# Patient Record
Sex: Female | Born: 1970 | Race: White | Hispanic: No | Smoking: Current every day smoker
Health system: Southern US, Community
[De-identification: ages and names within clinical notes are randomized; demographics above are authoritative.]

## PROBLEM LIST (undated history)

## (undated) DIAGNOSIS — M545 Low back pain, unspecified: Secondary | ICD-10-CM

## (undated) DIAGNOSIS — Z9889 Other specified postprocedural states: Secondary | ICD-10-CM

## (undated) DIAGNOSIS — I499 Cardiac arrhythmia, unspecified: Secondary | ICD-10-CM

## (undated) DIAGNOSIS — I10 Essential (primary) hypertension: Secondary | ICD-10-CM

## (undated) DIAGNOSIS — R0602 Shortness of breath: Secondary | ICD-10-CM

## (undated) DIAGNOSIS — F319 Bipolar disorder, unspecified: Secondary | ICD-10-CM

## (undated) DIAGNOSIS — K5790 Diverticulosis of intestine, part unspecified, without perforation or abscess without bleeding: Secondary | ICD-10-CM

## (undated) DIAGNOSIS — Z8719 Personal history of other diseases of the digestive system: Secondary | ICD-10-CM

## (undated) DIAGNOSIS — M797 Fibromyalgia: Secondary | ICD-10-CM

## (undated) DIAGNOSIS — R51 Headache: Secondary | ICD-10-CM

## (undated) DIAGNOSIS — M1990 Unspecified osteoarthritis, unspecified site: Secondary | ICD-10-CM

## (undated) DIAGNOSIS — F419 Anxiety disorder, unspecified: Secondary | ICD-10-CM

## (undated) HISTORY — DX: Unspecified osteoarthritis, unspecified site: M19.90

## (undated) HISTORY — PX: OTHER SURGICAL HISTORY: SHX169

## (undated) HISTORY — DX: Other specified postprocedural states: Z98.890

## (undated) HISTORY — DX: Diverticulosis of intestine, part unspecified, without perforation or abscess without bleeding: K57.90

## (undated) HISTORY — PX: TONSILLECTOMY AND ADENOIDECTOMY: SHX28

## (undated) HISTORY — PX: CHOLECYSTECTOMY: SHX55

## (undated) HISTORY — PX: HEMORRHOID SURGERY: SHX153

## (undated) HISTORY — PX: TONSILLECTOMY: SUR1361

## (undated) HISTORY — PX: FOOT SURGERY: SHX648

## (undated) HISTORY — PX: APPENDECTOMY: SHX54

## (undated) HISTORY — PX: ABDOMINAL HYSTERECTOMY: SHX81

## (undated) NOTE — ED Provider Notes (Signed)
 Formatting of this note is different from the original.  History   Chief Complaint  Patient presents with  ? Abdominal Pain    C/O RUQ PAIN X 2 HRS   HPI With the above chief complaint. She states she has this pain every day and she just cannot take it anymore. She states the pain is no different than usual and that she has had ultrasounds and computed axial tomography scans and blood work on multiple occasions in the past without any etiology of the pain. She states she has not maintained on any sort of pain medication regimen. She states I just need some pain medicine. The pain she describes as sharp and severe in the right upper quadrant. He has been nauseous but not vomiting. She did have some loose stool today that was nonbloody and without any melena. Denies any trauma.  Allergies as of 01/24/2011 - Review Complete 01/24/2011  Allergen Reaction Noted  ? Augmentin (amoxicillin-pot clavulanate) Rash 01/24/2011   Past Medical History  Diagnosis Date  ? Anxiety   ? Fibromyalgia   ? Bipolar depression   ? Degenerative disc disease   ? Diverticulitis   ? Colitis    Past Surgical History  Procedure Date  ? Cholecystectomy   ? Hysterectomy   ? Hernia repair   ? Hemorrhoid surgery    History reviewed. No pertinent family history.  History  Substance Use Topics  ? Smoking status: Current Everyday Smoker  ? Smokeless tobacco: Not on file  ? Alcohol Use: 0.6 oz/week    1 Glasses of wine per week   she states she is here visiting from [place]  to visit the beach  Patient's Medications  New Prescriptions   No medications on file  Previous Medications   CLONAZEPAM  (KLONOPIN ) 1 MG TABLET    Take 1 mg by mouth 2 (two) times daily as needed.     CYCLOBENZAPRINE (FLEXERIL) 10 MG TABLET    Take 25 mg by mouth 2 (two) times daily at 0600 and 1800.     DIAZEPAM  (VALIUM ) 10 MG TABLET    Take 10 mg by mouth 3 (three) times daily as needed.     DULOXETINE  (CYMBALTA ) 20  MG CAPSULE    Take 20 mg by mouth 2 (two) times daily.     GABAPENTIN  (NEURONTIN ) 600 MG TABLET    Take 600 mg by mouth 4 (four) times daily.     OMEPRAZOLE  (PRILOSEC) 20 MG CAPSULE    Take 20 mg by mouth every morning before breakfast.    Modified Medications   No medications on file  Discontinued Medications   No medications on file   Review of Systems Constitutional:  No fevers or chills Eyes:  No vision changes HENT:  No sore throat or sinus congestion Respiratory:  No cough Cardiovascular:  Chest pain GI:  Right upper quadrant pain that does not radiate. Nausea without vomiting. Loose stool today nonbloody and not black GU:  Mild pain with urination today. No vaginal discharge Musculoskeletal:  No extremity trauma or complaints Integument:  Denies rashes Neurologic:  No headache or numbness or tingling. The patient says she does feel sleepy and drowsy Endocrine:  No polyuria  Physical Exam  ED Triage Vitals  Temp 01/24/11 2147 98.5 F (36.9 C)  Temp Source 01/24/11 2147 Oral  Heart Rate 01/24/11 2147 78   Heart Rate Source --   Resp 01/24/11 2147 18   BP 01/24/11 2147 134/86 mmHg  BP Location 01/24/11  2147 Left upper arm  BP Method 01/24/11 2147 Automatic  SpO2 01/24/11 2147 97 %  O2 Device 01/24/11 2147 None (Room air)  O2 Flow Rate (L/min) --   Pain Assessment 01/24/11 2147 0-10  Pain Score 01/24/11 2147 Seven   Physical Exam Constitutional:  Drowsy but interactive overweight white female in no acute distress Eyes:  No icterus or injection or discharge. Pupils 3 mm bilaterally and reactive. No disconjugate gaze HENT:  Oropharynx moist without lesions or exudate. Neck without stiffness or masses or lymphadenopathy or thyromegaly Respiratory:  Clear to auscultation bilaterally Cardiovascular:  Regular rhythm without murmurs rubs or gallops. 2+ right radial pulse GI:  Protuberant nondistended. Patient does not make any show of pain with palpation of the abdomen  diffusely when questioned says it does hurt in the right upper quadrant on palpation. No palpable masses or hepatosplenomegaly. Normal bowel sounds. No bruits. Musculoskeletal:  Nontender, no edema, no deformities Integument:  No rashes, normal color, warm and dry Lymphatic:  No cervical lymphadenopathy Neurologic:  Drowsy but interactive. Speech is mildly slurred. Eyelids are heavy even her drowsiness but equal bilateral/no unilateral ptosis. Cranial nerves II through XII within normal limits. Normal sensation to light touch and 5 out of 5 strength x4. No tremors or fasciculations. Psychiatric:  Depressed/drowsy affect that seems consistent with opioid and/or benzodiazepine intoxication  ED Course  Procedures none  MDM I feel this patient is likely offering from over use of benzodiazepines and/or opioids. She denies any pain medication prescriptions, but review of her [place]  controlled substances reporting database shows multiple prescriptions for benzodiazepines and opioid pain medications from multiple providers although there are no recent restrictions for opioids, just benzodiazepines. Her last opioid prescription was back in June.  Her abdominal exam is extremely benign and the patient is afebrile with normal vital signs. She did complain of slight dysuria also will check a urinalysis to ensure she does not have a urinary tract infection and on also like to confirm her medication usage with a urine drug screen given her affect and level of alertness.  Her urine drug screen confirms benzodiazepines but fortunately no opioids. Given her double of alertness I do not think that mixing opioids with her benzos would be a good idea at this time. Recheck of her abdomen is unchanged at 12:02 AM  Clinical impression Right upper quadrant pain - chronic  Ever JAYSON Philips, MD 01/24/11 2320  Ever JAYSON Philips, MD 01/24/11 7668  Ever JAYSON Philips, MD 01/24/11 7665  Ever JAYSON Philips, MD 01/25/11  0001 Electronically signed by Ever JAYSON Philips, MD at 01/25/2011 12:01 AM EDT

---

## 2010-02-11 ENCOUNTER — Emergency Department (HOSPITAL_COMMUNITY): Admission: EM | Admit: 2010-02-11 | Discharge: 2010-02-11 | Payer: Self-pay | Admitting: Emergency Medicine

## 2010-04-04 ENCOUNTER — Emergency Department (HOSPITAL_COMMUNITY): Admission: EM | Admit: 2010-04-04 | Discharge: 2010-04-04 | Payer: Self-pay | Admitting: Emergency Medicine

## 2010-05-13 ENCOUNTER — Emergency Department (HOSPITAL_COMMUNITY): Admission: EM | Admit: 2010-05-13 | Discharge: 2010-05-13 | Payer: Self-pay | Admitting: Emergency Medicine

## 2010-06-03 ENCOUNTER — Emergency Department (HOSPITAL_COMMUNITY)
Admission: EM | Admit: 2010-06-03 | Discharge: 2010-06-03 | Payer: Self-pay | Source: Home / Self Care | Admitting: Emergency Medicine

## 2010-06-06 ENCOUNTER — Ambulatory Visit: Payer: Self-pay | Admitting: Internal Medicine

## 2010-06-06 DIAGNOSIS — R1031 Right lower quadrant pain: Secondary | ICD-10-CM | POA: Insufficient documentation

## 2010-06-06 DIAGNOSIS — R197 Diarrhea, unspecified: Secondary | ICD-10-CM | POA: Insufficient documentation

## 2010-06-06 HISTORY — PX: UMBILICAL HERNIA REPAIR: SHX196

## 2010-06-07 ENCOUNTER — Encounter: Payer: Self-pay | Admitting: Internal Medicine

## 2010-06-10 LAB — CONVERTED CEMR LAB
Basophils Relative: 1 % (ref 0–1)
HCT: 40.6 % (ref 36.0–46.0)
Hemoglobin: 13.6 g/dL (ref 12.0–15.0)
MCHC: 33.5 g/dL (ref 30.0–36.0)
MCV: 87.1 fL (ref 78.0–100.0)
Monocytes Absolute: 0.8 10*3/uL (ref 0.1–1.0)
Monocytes Relative: 8 % (ref 3–12)
Neutro Abs: 5.4 10*3/uL (ref 1.7–7.7)
RBC: 4.66 M/uL (ref 3.87–5.11)

## 2010-06-14 ENCOUNTER — Telehealth (INDEPENDENT_AMBULATORY_CARE_PROVIDER_SITE_OTHER): Payer: Self-pay

## 2010-06-17 ENCOUNTER — Encounter: Payer: Self-pay | Admitting: Internal Medicine

## 2010-06-21 ENCOUNTER — Encounter (INDEPENDENT_AMBULATORY_CARE_PROVIDER_SITE_OTHER): Payer: Self-pay | Admitting: *Deleted

## 2010-06-21 ENCOUNTER — Encounter: Payer: Self-pay | Admitting: Internal Medicine

## 2010-06-28 ENCOUNTER — Ambulatory Visit (HOSPITAL_COMMUNITY)
Admission: RE | Admit: 2010-06-28 | Discharge: 2010-06-28 | Payer: Self-pay | Source: Home / Self Care | Attending: General Surgery | Admitting: General Surgery

## 2010-07-08 ENCOUNTER — Emergency Department (HOSPITAL_COMMUNITY)
Admission: EM | Admit: 2010-07-08 | Discharge: 2010-07-08 | Payer: Self-pay | Source: Home / Self Care | Admitting: Emergency Medicine

## 2010-07-09 ENCOUNTER — Encounter: Payer: Self-pay | Admitting: Internal Medicine

## 2010-07-29 ENCOUNTER — Emergency Department (HOSPITAL_COMMUNITY)
Admission: EM | Admit: 2010-07-29 | Discharge: 2010-07-29 | Payer: Self-pay | Source: Home / Self Care | Admitting: Emergency Medicine

## 2010-07-30 LAB — COMPREHENSIVE METABOLIC PANEL
AST: 16 U/L (ref 0–37)
Albumin: 3.9 g/dL (ref 3.5–5.2)
Calcium: 9 mg/dL (ref 8.4–10.5)
Creatinine, Ser: 0.67 mg/dL (ref 0.4–1.2)
GFR calc Af Amer: >60 mL/min (ref >60)
GFR calc non Af Amer: >60 mL/min (ref >60)
Total Protein: 6.7 g/dL (ref 6.0–8.3)

## 2010-07-30 LAB — POCT PREGNANCY, URINE: Preg Test, Ur: NEGATIVE

## 2010-07-30 LAB — CBC
MCH: 29.7 pg (ref 26.0–34.0)
MCHC: 34.9 g/dL (ref 30.0–36.0)
Platelets: 234 10*3/uL (ref 150–400)
RDW: 13.5 % (ref 11.5–15.5)

## 2010-07-30 LAB — DIFFERENTIAL
Basophils Absolute: 0 10*3/uL (ref 0.0–0.1)
Basophils Relative: 0 % (ref 0–1)
Eosinophils Absolute: 0.2 10*3/uL (ref 0.0–0.7)
Monocytes Absolute: 0.7 10*3/uL (ref 0.1–1.0)
Monocytes Relative: 8 % (ref 3–12)

## 2010-07-30 LAB — URINALYSIS, ROUTINE W REFLEX MICROSCOPIC
Bilirubin Urine: NEGATIVE
Ketones, ur: NEGATIVE mg/dL
Nitrite: NEGATIVE
Specific Gravity, Urine: >1.03 — ABNORMAL HIGH (ref 1.005–1.030)
Urobilinogen, UA: 0.2 mg/dL (ref 0.0–1.0)

## 2010-08-06 NOTE — Letter (Signed)
Summary: RAD REPORT CT ABD/PEL W/CM  RAD REPORT CT ABD/PEL W/CM   Imported By: Rexene Alberts 06/06/2010 16:38:23  _____________________________________________________________________  External Attachment:    Type:   Image     Comment:   External Document  Appended Document: phone note/ abd pain Would go ahead and make an appt w Dr. Lovell Sheehan: ref dx laparoscopy and appendectomy for r sided abd pain  Appended Document: phone note/ abd pain Pt aware that [facility] will make referral.

## 2010-08-06 NOTE — Letter (Signed)
Summary: REFERRAL FROM THE [facility] ER  REFERRAL FROM THE [facility] ER   Imported By: Rexene Alberts 06/06/2010 16:32:28  _____________________________________________________________________  External Attachment:    Type:   Image     Comment:   External Document

## 2010-08-06 NOTE — Assessment & Plan Note (Signed)
Summary: ABD PAIN/SS   Visit Type:  Initial Visit Primary Care Kedrick Mcnamee:  hasanji  Chief Complaint:  R side abd pain.  History of Present Illness: 40 year old lady referred by Dr. Romilda Garret for further evaluation of one-year history right lower quadrant abdominal pain and diarrhea. Historically, this lady was constipated and on Amitiza until one year ago. Saw Dr. Karilyn Cota for constipation last year and underwent colonoscopy which revealed diverticulosis only. She states right lower quadrant pain has been progressive. She's had multiple surgeries. She doesn't know if she still has her appendix.  Just 3 days ago, she presented to the [facility] emergency room for further evaluation. She describes getting a CT scan-  I do not have a report for review but she was told everything was okay. It was 2 rounds of antibiotics over the past year for bronchitis; she is not passed any blood per rectum; she denies weight lossshe denies any upper GI tract symptoms such as odynaphagia, dysphagia, early satiety, reflux symptoms nausea or vomiting.  Pain not affected by eating or having a bowel movement. It is made worse by climbing steps going over bumps in a car and changing the patient's position from time to time.  Preventive Screening-Counseling & Management  Alcohol-Tobacco     Smoking Status: current  Current Medications (verified): 1)  Sertraline Hcl 100 Mg Tabs (Sertraline Hcl) .Marland Kitchen.. 11/2 Once Daily 2)  Celebrex 200 Mg Caps (Celecoxib) .... Two Times A Day 3)  Omeprazole 20 Mg Cpdr (Omeprazole) .... Once Daily 4)  Klonopin 1 Mg Tabs (Clonazepam) .... Once Daily 5)  Gabapentin 600 Mg Tabs (Gabapentin) .... 4 Three Times A Day 6)  Abilify 10 Mg Tabs (Aripiprazole) .... Once Daily  Allergies (verified): 1)  ! Amoxicillin  Past History:  Past Medical History: kidney stones asthma diverticulitis colitis arthritis  Past Surgical History: tonsils tubes in ears anoids removed 3 hernia repair mole removed    wisdom teeth removed  tcs 2009 polys hemorroids  laporoscopy R ovary removed c- section gallbladder hyst L ovary removed foot surgery  Family History: Father: alive- crohns, heart dx Mother: alive- htn- fibroids Siblings: 2 brothers, 3 sisters Family History of Colon Cancer:paternal and maternal grandfather  Social History: Marital Status- single Children: 2 Occupation: no Patient currently smokes.  Alcohol Use - no Smoking Status:  current  Vital Signs:  Patient profile:   40 year old female Height:      66 inches Weight:      193 pounds BMI:     31.26 Temp:     98.1 degrees F oral Pulse rate:   68 / minute BP sitting:   132 / 90  (left arm) Cuff size:   regular  Vitals Entered By: Hendricks Limes LPN (June 06, 2010 3:06 PM)  Physical Exam  General:  pleasant young lady resting currently in no acute distress Eyes:  no scleral icterus. Lungs:  clear to auscultation Heart:  regular rate rhythm without murmur gallop or rub Abdomen:  abdomen obese positive bowel sounds soft localized right lower quadrant tenderness to palpation some guarding no mass no remaining no organomegaly.  No CVA tenderness  Impression & Recommendations: Impression: 40 year old lady with one-year history of right lower quadrant abdominal pain along with diarrhea. Previously, constipated. Antibiotic exposure over the past one year. Right lower quadrant pain has a positional/movement component, which makes me think about possibility of chronic appendicitis or adhesive disease. Negative TCS one year ago.  I would certainly like to review the  CT scan which was recently done.  With antibiotic exposure, need to be concerned about the possibility of a C. difficile infection.  Recommendations: We'll obtain  a Clostridium difficile PCR, stool culture and white cells; review CT scan. We'll make further recommendations in the very near future.  Other Orders: T-CBC w/Diff ([id number]) T-Culture,  Stool ([id number]) T-C diff by PCR ([id number])  Appended Document: Orders Update    Clinical Lists Changes  Orders: Added new Service order of Consultation Level III [phone]) - Signed

## 2010-08-06 NOTE — Letter (Signed)
Summary: RECORDS FROM [facility]  RECORDS FROM [facility]   Imported By: Rexene Alberts 06/06/2010 16:37:01  _____________________________________________________________________  External Attachment:    Type:   Image     Comment:   External Document

## 2010-08-08 NOTE — Letter (Signed)
Summary: DISABILITY DETERMINATION  DISABILITY DETERMINATION   Imported By: Rexene Alberts 07/09/2010 16:37:26  _____________________________________________________________________  External Attachment:    Type:   Image     Comment:   External Document

## 2010-08-08 NOTE — Letter (Signed)
Summary: DISABILITY DETERMINATION  DISABILITY DETERMINATION   Imported By: Rexene Alberts 06/21/2010 14:42:05  _____________________________________________________________________  External Attachment:    Type:   Image     Comment:   External Document

## 2010-08-08 NOTE — Progress Notes (Signed)
Summary: phone note/ abd pain  Phone Note Call from Patient   Caller: Patient Summary of Call: Pt called and said she continues to have right sided abd pain since OV. Labs and stools were normal. She said that the pain is constant.  She is not having any nausea/vomiting or diarrhea. Having a normal BM daily. Said Dr. Jena Gauss mentioned possibility of referring her to  a surgeon to find out the problem, and possibly have her appendix removed. She does not have a preference . Said she is having surgery on her foot next week, so it would have to be later than that. Please advise! ( She is aware that Dr. Jena Gauss is at the hospital this AM and will be afternoon before she hears back.) Initial call taken by: Cloria Spring LPN,  June 14, 2010 9:35 AM     Appended Document: phone note/ abd pain Would go ahead and make an appt w Dr. Lovell Sheehan: ref dx laparoscopy and appendectomy for r sided abd pain  Appended Document: phone note/ abd pain LMOm to call.  Appended Document: phone note/ abd pain Pt aware that [facility] Mall[facility] will make referral.  Appended Document: phone note/ abd pain Referral faxed.

## 2010-08-08 NOTE — Miscellaneous (Signed)
Summary: HISTORY & PHYSICAL  Clinical Lists Changes NAME:  Allison Bolton, Allison Bolton            ACCOUNT NO.:  [id number]      MEDICAL RECORD NO.:  [id number]          PATIENT TYPE:  AMB      LOCATION:  DAY                           FACILITY:  [facility]      PHYSICIAN:  Dalia Heading, M.D.  DATE OF BIRTH:  July 14, 1970      DATE OF ADMISSION:   DATE OF DISCHARGE:  [facility]                                 HISTORY          CHIEF COMPLAINT:  Nonspecific abdominal pain.      HISTORY OF PRESENT ILLNESS:  The patient is a 40 year old white female   who is referred for evaluation and treatment of nonspecific abdominal   pain.  It has been present for over 1 year.  It is primarily just   superior and to the right of the umbilicus.  It is made worse with   movement.  She has had multiple abdominal surgeries for endometriosis   including a TAH-BSO ultimately.      PAST MEDICAL HISTORY:  Includes; anxiety disorder, arthritis, question   diverticulitis, question colitis, endometriosis.      PAST SURGICAL HISTORY:  Laparoscopic cholecystectomy, multiple   diagnostic laparoscopies for endometriosis including a TAH-BSO   ultimately.      CURRENT MEDICATIONS:   1. Sertraline 150 mg p.o. daily.   2. Celebrex 200 mg p.o. b.i.d.   3. Omeprazole 20 mg p.o. daily.   4. Klonopin 1 mg p.o. daily.   5. Gabapentin t.i.d.   6. Abilify 10 mg p.o. daily.      ALLERGIES:  AMOXICILLIN.      REVIEW OF SYSTEMS:  The patient does smoke tobacco.  She denies any   significant alcohol use.  She denies any other cardiopulmonary   difficulties or bleeding disorders.      PHYSICAL EXAMINATION:  GENERAL:  The patient is a well-developed, well-   nourished white female, in no acute distress.   HEENT:  Unremarkable.   LUNGS:  Clear to auscultation with equal breath sounds bilaterally.   HEART:  Regular rate and rhythm without S3, S4, or murmurs.   ABDOMEN:  Soft, nondistended.  She is tender in the right upper quadrant   to  palpation, close to the umbilicus.  A small umbilical hernia which on   palpation reproduces her pain.  No hepatosplenomegaly or masses are   noted.  CT scan of the abdomen and pelvis is unremarkable.  Her appendix   is within normal limits.      IMPRESSION:  Incisional hernia/umbilicus.      PLAN:  The patient is scheduled for an incisional herniorrhaphy with   mesh, umbilical on June 28, 2010.  The risks and benefits of the   procedure including bleeding, infection, recurrence of the hernia, and   recurrence of the pain were fully explained to the patient, gave   informed consent.  As her pain is reproducible, I do not feel that a   laparoscopy with possible appendectomy is warranted at this time.  She  is fully aware of that and knows that she may need a diagnostic   laparoscopy in the future.               Dalia Heading, M.D.               MAJ/MEDQ  D:  06/18/2010  T:  06/19/2010  Job:  [id number]      cc:   R. Roetta Sessions, M.D.   [address]   [place]   [place] [address]      Electronically Signed by Franky Macho M.D. on 06/21/2010 08:48:37 AM

## 2010-08-08 NOTE — Letter (Signed)
Summary: DR Lovell Sheehan REFERRAL  DR Lovell Sheehan REFERRAL   Imported By: Ave Filter 06/17/2010 09:14:01  _____________________________________________________________________  External Attachment:    Type:   Image     Comment:   External Document

## 2010-09-16 LAB — RAPID STREP SCREEN (MED CTR MEBANE ONLY): Streptococcus, Group A Screen (Direct): NEGATIVE

## 2010-09-17 LAB — COMPREHENSIVE METABOLIC PANEL
ALT: 17 U/L (ref 0–35)
AST: 17 U/L (ref 0–37)
CO2: 26 mEq/L (ref 19–32)
Chloride: 106 mEq/L (ref 96–112)
GFR calc Af Amer: >60 mL/min (ref >60)
GFR calc non Af Amer: >60 mL/min (ref >60)
Potassium: 4 mEq/L (ref 3.5–5.1)
Sodium: 141 mEq/L (ref 135–145)
Total Bilirubin: 0.6 mg/dL (ref 0.3–1.2)

## 2010-09-17 LAB — DIFFERENTIAL
Basophils Absolute: 0 10*3/uL (ref 0.0–0.1)
Eosinophils Absolute: 0.2 10*3/uL (ref 0.0–0.7)
Eosinophils Relative: 2 % (ref 0–5)

## 2010-09-17 LAB — URINALYSIS, ROUTINE W REFLEX MICROSCOPIC
Bilirubin Urine: NEGATIVE
Nitrite: NEGATIVE
Specific Gravity, Urine: >1.03 — ABNORMAL HIGH (ref 1.005–1.030)
pH: 6 (ref 5.0–8.0)

## 2010-09-17 LAB — CBC
Hemoglobin: 13.6 g/dL (ref 12.0–15.0)
RBC: 4.51 MIL/uL (ref 3.87–5.11)

## 2010-09-17 LAB — LIPASE, BLOOD: Lipase: 34 U/L (ref 11–59)

## 2010-09-19 LAB — CBC
HCT: 37.3 % (ref 36.0–46.0)
Hemoglobin: 12.9 g/dL (ref 12.0–15.0)
MCV: 88.3 fL (ref 78.0–100.0)
RDW: 13.2 % (ref 11.5–15.5)
WBC: 8.9 10*3/uL (ref 4.0–10.5)

## 2010-09-19 LAB — URINALYSIS, ROUTINE W REFLEX MICROSCOPIC
Glucose, UA: NEGATIVE mg/dL
Hgb urine dipstick: NEGATIVE
Ketones, ur: NEGATIVE mg/dL
Protein, ur: NEGATIVE mg/dL

## 2010-09-19 LAB — POCT I-STAT, CHEM 8
BUN: 15 mg/dL (ref 6–23)
Calcium, Ion: 1.08 mmol/L — ABNORMAL LOW (ref 1.12–1.32)
Chloride: 107 mEq/L (ref 96–112)
Creatinine, Ser: 0.7 mg/dL (ref 0.4–1.2)
Glucose, Bld: 88 mg/dL (ref 70–99)
HCT: 40 % (ref 36.0–46.0)
Potassium: 3.6 mEq/L (ref 3.5–5.1)

## 2010-09-19 LAB — DIFFERENTIAL
Basophils Absolute: 0.1 10*3/uL (ref 0.0–0.1)
Lymphocytes Relative: 40 % (ref 12–46)
Lymphs Abs: 3.6 10*3/uL (ref 0.7–4.0)
Neutro Abs: 4.3 10*3/uL (ref 1.7–7.7)
Neutrophils Relative %: 49 % (ref 43–77)

## 2010-09-19 LAB — HEPATIC FUNCTION PANEL
ALT: 12 U/L (ref 0–35)
AST: 15 U/L (ref 0–37)
Albumin: 3.7 g/dL (ref 3.5–5.2)
Alkaline Phosphatase: 37 U/L — ABNORMAL LOW (ref 39–117)
Total Protein: 6.2 g/dL (ref 6.0–8.3)

## 2010-11-26 ENCOUNTER — Emergency Department (HOSPITAL_COMMUNITY)
Admission: EM | Admit: 2010-11-26 | Discharge: 2010-11-26 | Disposition: A | Discharge status: Home / Self Care | Payer: Medicaid Other | Attending: Emergency Medicine | Admitting: Emergency Medicine

## 2010-11-26 ENCOUNTER — Encounter (HOSPITAL_COMMUNITY): Payer: Self-pay | Admitting: Radiology

## 2010-11-26 ENCOUNTER — Emergency Department (HOSPITAL_COMMUNITY): Payer: Medicaid Other

## 2010-11-26 DIAGNOSIS — Z79899 Other long term (current) drug therapy: Secondary | ICD-10-CM | POA: Insufficient documentation

## 2010-11-26 DIAGNOSIS — J45909 Unspecified asthma, uncomplicated: Secondary | ICD-10-CM | POA: Insufficient documentation

## 2010-11-26 DIAGNOSIS — J189 Pneumonia, unspecified organism: Secondary | ICD-10-CM | POA: Insufficient documentation

## 2010-11-26 DIAGNOSIS — F319 Bipolar disorder, unspecified: Secondary | ICD-10-CM | POA: Insufficient documentation

## 2010-11-28 ENCOUNTER — Emergency Department (HOSPITAL_COMMUNITY)
Admission: EM | Admit: 2010-11-28 | Discharge: 2010-11-28 | Disposition: A | Discharge status: Home / Self Care | Payer: Medicaid Other | Attending: Emergency Medicine | Admitting: Emergency Medicine

## 2010-11-28 DIAGNOSIS — R197 Diarrhea, unspecified: Secondary | ICD-10-CM | POA: Insufficient documentation

## 2010-11-28 DIAGNOSIS — R059 Cough, unspecified: Secondary | ICD-10-CM | POA: Insufficient documentation

## 2010-11-28 DIAGNOSIS — R509 Fever, unspecified: Secondary | ICD-10-CM | POA: Insufficient documentation

## 2010-11-28 DIAGNOSIS — J189 Pneumonia, unspecified organism: Secondary | ICD-10-CM | POA: Insufficient documentation

## 2010-11-28 DIAGNOSIS — R05 Cough: Secondary | ICD-10-CM | POA: Insufficient documentation

## 2010-11-28 DIAGNOSIS — F319 Bipolar disorder, unspecified: Secondary | ICD-10-CM | POA: Insufficient documentation

## 2010-11-28 LAB — COMPREHENSIVE METABOLIC PANEL
Alkaline Phosphatase: 88 U/L (ref 39–117)
BUN: 12 mg/dL (ref 6–23)
Chloride: 103 mEq/L (ref 96–112)
Creatinine, Ser: 0.61 mg/dL (ref 0.4–1.2)
Glucose, Bld: 125 mg/dL — ABNORMAL HIGH (ref 70–99)
Potassium: 4 mEq/L (ref 3.5–5.1)
Total Bilirubin: 0.2 mg/dL — ABNORMAL LOW (ref 0.3–1.2)
Total Protein: 7.4 g/dL (ref 6.0–8.3)

## 2010-11-28 LAB — LIPASE, BLOOD: Lipase: 19 U/L (ref 11–59)

## 2010-11-28 LAB — URINALYSIS, ROUTINE W REFLEX MICROSCOPIC
Bilirubin Urine: NEGATIVE
Ketones, ur: NEGATIVE mg/dL
Nitrite: NEGATIVE
Urobilinogen, UA: 0.2 mg/dL (ref 0.0–1.0)

## 2010-11-28 LAB — CBC
HCT: 38.5 % (ref 36.0–46.0)
MCH: 29.2 pg (ref 26.0–34.0)
MCV: 86.5 fL (ref 78.0–100.0)
RDW: 13.6 % (ref 11.5–15.5)
WBC: 12.1 10*3/uL — ABNORMAL HIGH (ref 4.0–10.5)

## 2010-11-28 LAB — DIFFERENTIAL
Eosinophils Relative: 0 % (ref 0–5)
Lymphocytes Relative: 26 % (ref 12–46)
Lymphs Abs: 3.1 10*3/uL (ref 0.7–4.0)
Monocytes Relative: 6 % (ref 3–12)

## 2010-11-28 LAB — PREGNANCY, URINE: Preg Test, Ur: NEGATIVE

## 2010-12-03 ENCOUNTER — Emergency Department (HOSPITAL_COMMUNITY): Payer: Medicaid Other

## 2010-12-03 ENCOUNTER — Emergency Department (HOSPITAL_COMMUNITY)
Admission: EM | Admit: 2010-12-03 | Discharge: 2010-12-03 | Disposition: A | Discharge status: Home / Self Care | Payer: Medicaid Other | Attending: Emergency Medicine | Admitting: Emergency Medicine

## 2010-12-03 DIAGNOSIS — F319 Bipolar disorder, unspecified: Secondary | ICD-10-CM | POA: Insufficient documentation

## 2010-12-03 DIAGNOSIS — J40 Bronchitis, not specified as acute or chronic: Secondary | ICD-10-CM | POA: Insufficient documentation

## 2010-12-03 DIAGNOSIS — Z79899 Other long term (current) drug therapy: Secondary | ICD-10-CM | POA: Insufficient documentation

## 2010-12-03 DIAGNOSIS — IMO0002 Reserved for concepts with insufficient information to code with codable children: Secondary | ICD-10-CM | POA: Insufficient documentation

## 2010-12-03 DIAGNOSIS — J45909 Unspecified asthma, uncomplicated: Secondary | ICD-10-CM | POA: Insufficient documentation

## 2011-01-27 ENCOUNTER — Encounter (HOSPITAL_COMMUNITY): Payer: Self-pay | Admitting: *Deleted

## 2011-01-27 ENCOUNTER — Emergency Department (HOSPITAL_COMMUNITY)
Admission: EM | Admit: 2011-01-27 | Discharge: 2011-01-27 | Disposition: A | Discharge status: Home / Self Care | Payer: Medicaid Other | Attending: Emergency Medicine | Admitting: Emergency Medicine

## 2011-01-27 DIAGNOSIS — R110 Nausea: Secondary | ICD-10-CM | POA: Insufficient documentation

## 2011-01-27 DIAGNOSIS — R1084 Generalized abdominal pain: Secondary | ICD-10-CM

## 2011-01-27 DIAGNOSIS — J45909 Unspecified asthma, uncomplicated: Secondary | ICD-10-CM | POA: Insufficient documentation

## 2011-01-27 DIAGNOSIS — F319 Bipolar disorder, unspecified: Secondary | ICD-10-CM | POA: Insufficient documentation

## 2011-01-27 DIAGNOSIS — R197 Diarrhea, unspecified: Secondary | ICD-10-CM | POA: Insufficient documentation

## 2011-01-27 DIAGNOSIS — F17200 Nicotine dependence, unspecified, uncomplicated: Secondary | ICD-10-CM | POA: Insufficient documentation

## 2011-01-27 DIAGNOSIS — IMO0001 Reserved for inherently not codable concepts without codable children: Secondary | ICD-10-CM | POA: Insufficient documentation

## 2011-01-27 HISTORY — DX: Fibromyalgia: M79.7

## 2011-01-27 HISTORY — DX: Bipolar disorder, unspecified: F31.9

## 2011-01-27 LAB — DIFFERENTIAL
Basophils Absolute: 0.1 10*3/uL (ref 0.0–0.1)
Basophils Relative: 1 % (ref 0–1)
Eosinophils Absolute: 0.3 10*3/uL (ref 0.0–0.7)
Eosinophils Relative: 3 % (ref 0–5)
Lymphocytes Relative: 39 % (ref 12–46)
Lymphs Abs: 3.4 10*3/uL (ref 0.7–4.0)
Monocytes Absolute: 0.7 10*3/uL (ref 0.1–1.0)
Monocytes Relative: 8 % (ref 3–12)
Neutro Abs: 4.4 10*3/uL (ref 1.7–7.7)
Neutrophils Relative %: 49 % (ref 43–77)

## 2011-01-27 LAB — CBC
HCT: 40.8 % (ref 36.0–46.0)
Hemoglobin: 13.9 g/dL (ref 12.0–15.0)
MCH: 29.9 pg (ref 26.0–34.0)
MCHC: 34.1 g/dL (ref 30.0–36.0)
MCV: 87.7 fL (ref 78.0–100.0)
Platelets: 291 10*3/uL (ref 150–400)
RBC: 4.65 MIL/uL (ref 3.87–5.11)
RDW: 13.2 % (ref 11.5–15.5)
WBC: 8.9 10*3/uL (ref 4.0–10.5)

## 2011-01-27 LAB — BASIC METABOLIC PANEL
BUN: 12 mg/dL (ref 6–23)
CO2: 25 mEq/L (ref 19–32)
Calcium: 9.9 mg/dL (ref 8.4–10.5)
Chloride: 103 mEq/L (ref 96–112)
Creatinine, Ser: 0.6 mg/dL (ref 0.50–1.10)
GFR calc Af Amer: >60 mL/min (ref >60)
GFR calc non Af Amer: >60 mL/min (ref >60)
Glucose, Bld: 120 mg/dL — ABNORMAL HIGH (ref 70–99)
Potassium: 3.9 mEq/L (ref 3.5–5.1)
Sodium: 139 mEq/L (ref 135–145)

## 2011-01-27 LAB — HEPATIC FUNCTION PANEL
ALT: 22 U/L (ref 0–35)
AST: 20 U/L (ref 0–37)
Albumin: 3.6 g/dL (ref 3.5–5.2)
Alkaline Phosphatase: 76 U/L (ref 39–117)
Bilirubin, Direct: 0.1 mg/dL (ref 0.0–0.3)
Indirect Bilirubin: 0 mg/dL — ABNORMAL LOW (ref 0.3–0.9)
Total Bilirubin: 0.1 mg/dL — ABNORMAL LOW (ref 0.3–1.2)
Total Protein: 7.6 g/dL (ref 6.0–8.3)

## 2011-01-27 LAB — LIPASE, BLOOD: Lipase: 30 U/L (ref 11–59)

## 2011-01-27 LAB — ETHANOL: Alcohol, Ethyl (B): <11 mg/dL (ref 0–11)

## 2011-01-27 MED ORDER — OXYCODONE-ACETAMINOPHEN 5-325 MG PO TABS: 1.0000 | ORAL_TABLET | Freq: Four times a day (QID) | ORAL | Status: AC | PRN

## 2011-01-27 MED ORDER — HYDROMORPHONE HCL 1 MG/ML IJ SOLN
1.0000 mg | Freq: Once | INTRAMUSCULAR | Status: AC
Start: 2011-01-27 — End: 2011-01-27
  Administered 2011-01-27: 1 mg via INTRAVENOUS
  Filled 2011-01-27: qty 1

## 2011-01-27 NOTE — ED Notes (Signed)
Pt c/o right sided abdominal pain x 1wk and nausea.

## 2011-01-27 NOTE — ED Notes (Signed)
Rt sided abd pain x 1 week associated with diarrhea.

## 2011-01-27 NOTE — ED Notes (Signed)
Re-eval, pt lying quietly on stretcher, speech slurred, pupils mid-point. Movements slow. Pt denies any narcotics or alcohol today. Medicated as noted.

## 2011-01-27 NOTE — ED Provider Notes (Signed)
History     Chief Complaint  Patient presents with  . Abdominal Pain   Patient is a 40 y.o. female presenting with abdominal pain. The history is provided by the patient. No language interpreter was used.  Abdominal Pain The primary symptoms of the illness include abdominal pain, nausea and diarrhea. The primary symptoms of the illness do not include fever or shortness of breath. Episode onset: 1 week ago. The onset of the illness was sudden. The problem has been gradually worsening.  Associated with: nothing. The patient states that she believes she is currently not pregnant. The patient has had a change in bowel habit (diarrhea). Risk factors for an acute abdominal problem include a history of abdominal surgery. Symptoms associated with the illness do not include chills.  C/o right sided abdominal pain onset 1 week ago and persistent since with associated nausea, diarrhea. Patient notes pain is not relieved with applying heating pads and taking ibuprofen. Patient reports she was told she had multiple cysts on liver during her last ED visit but notes she has not followed up with anyone regarding the condition. Denies fever, SOB, chest pain. Patient with h/o asthma, bipolar 1 disorder, fibromyalgia, hernia repair, hysterectomy, cholecystectomy.  Patient seen at 7:18 PM   Past Medical History  Diagnosis Date  . Asthma   . Bipolar 1 disorder   . Fibromyalgia     Past Surgical History  Procedure Date  . Tonsillectomy   . Hernia repair   . Abdominal hysterectomy   . Hemorrhoid surgery   . Cholecystectomy   . Foot surgery     Family History  Problem Relation Age of Onset  . Thyroid disease Mother     History  Substance Use Topics  . Smoking status: Current Everyday Smoker  . Smokeless tobacco: Not on file  . Alcohol Use: Yes     occasional    OB History    Grav Para Term Preterm Abortions TAB SAB Ect Mult Living                  Review of Systems  Constitutional:  Negative for fever and chills.  Respiratory: Negative for shortness of breath.   Cardiovascular: Negative for chest pain.  Gastrointestinal: Positive for nausea, abdominal pain and diarrhea.  All other systems reviewed and are negative.  All other systems negative except as noted in HPI.   Physical Exam  BP 120/71  Pulse 95  Temp(Src) 98.3 F (36.8 C) (Oral)  Resp 17  Ht 5\' 6"  (1.676 m)  Wt 200 lb (90.719 kg)  BMI 32.28 kg/m2  SpO2 96%  Physical Exam  Nursing note and vitals reviewed. Constitutional: She is oriented to person, place, and time. Vital signs are normal. She appears well-developed and well-nourished. No distress.  HENT:  Head: Normocephalic and atraumatic.  Eyes: Conjunctivae are normal. Pupils are equal, round, and reactive to light. No scleral icterus.  Neck: Normal range of motion. Neck supple.  Cardiovascular: Normal rate, regular rhythm, normal heart sounds, intact distal pulses and normal pulses.   No murmur heard. Pulmonary/Chest: Effort normal and breath sounds normal. She has no wheezes.  Abdominal: Soft. Bowel sounds are normal. She exhibits no distension. There is no hepatosplenomegaly. There is tenderness in the right upper quadrant.       No tenderness of RLQ/LLQ.   Musculoskeletal: Normal range of motion. She exhibits no edema.  Neurological: She is alert and oriented to person, place, and time. No sensory deficit.  Skin:  Skin is warm and dry.  Psychiatric: She has a normal mood and affect. Her behavior is normal.    ED Course  Procedures 8:27 PM-Patient informed of lab results and intent to d/c home. Patient explained reason for not ordering a CT-Abdomen (limiting radiation exposure). Patient agrees with plan to d/c home at this time.  MDM  Results for orders placed during the hospital encounter of 01/27/11  CBC      Component Value Range   WBC 8.9  4.0 - 10.5 (K/uL)   RBC 4.65  3.87 - 5.11 (MIL/uL)   Hemoglobin 13.9  12.0 - 15.0 (g/dL)    HCT 16.1  09.6 - 04.5 (%)   MCV 87.7  78.0 - 100.0 (fL)   MCH 29.9  26.0 - 34.0 (pg)   MCHC 34.1  30.0 - 36.0 (g/dL)   RDW 40.9  81.1 - 91.4 (%)   Platelets 291  150 - 400 (K/uL)  DIFFERENTIAL      Component Value Range   Neutrophils Relative 49  43 - 77 (%)   Neutro Abs 4.4  1.7 - 7.7 (K/uL)   Lymphocytes Relative 39  12 - 46 (%)   Lymphs Abs 3.4  0.7 - 4.0 (K/uL)   Monocytes Relative 8  3 - 12 (%)   Monocytes Absolute 0.7  0.1 - 1.0 (K/uL)   Eosinophils Relative 3  0 - 5 (%)   Eosinophils Absolute 0.3  0.0 - 0.7 (K/uL)   Basophils Relative 1  0 - 1 (%)   Basophils Absolute 0.1  0.0 - 0.1 (K/uL)  BASIC METABOLIC PANEL      Component Value Range   Sodium 139  135 - 145 (mEq/L)   Potassium 3.9  3.5 - 5.1 (mEq/L)   Chloride 103  96 - 112 (mEq/L)   CO2 25  19 - 32 (mEq/L)   Glucose, Bld 120 (*) 70 - 99 (mg/dL)   BUN 12  6 - 23 (mg/dL)   Creatinine, Ser 7.82  0.50 - 1.10 (mg/dL)   Calcium 9.9  8.4 - 95.6 (mg/dL)   GFR calc non Af Amer >60  >60 (mL/min)   GFR calc Af Amer >60  >60 (mL/min)  HEPATIC FUNCTION PANEL      Component Value Range   Total Protein 7.6  6.0 - 8.3 (g/dL)   Albumin 3.6  3.5 - 5.2 (g/dL)   AST 20  0 - 37 (U/L)   ALT 22  0 - 35 (U/L)   Alkaline Phosphatase 76  39 - 117 (U/L)   Total Bilirubin 0.1 (*) 0.3 - 1.2 (mg/dL)   Bilirubin, Direct 0.1  0.0 - 0.3 (mg/dL)   Indirect Bilirubin 0.0 (*) 0.3 - 0.9 (mg/dL)  LIPASE, BLOOD      Component Value Range   Lipase 30  11 - 59 (U/L)  ETHANOL      Component Value Range   Alcohol, Ethyl (B) <11  0 - 11 (mg/dL)      Chart written by Clarita Crane acting as scribe for Bonnita Levan. Bernette Mayers, MD  I personally performed the services described in this documentation, which was scribed in my presence. The recorded information has been reviewed and considered.   Chronic RUQ abdominal pain, s/p cholecystectomy. States she has had 'cysts on her liver' at several ED visits in the past. No need for further CT eval now. Will  refer to GI for further workup if needed.   Tyquarius Paglia B. Bernette Mayers, MD 01/27/11 2031

## 2011-02-03 ENCOUNTER — Encounter: Payer: Self-pay | Admitting: Gastroenterology

## 2011-02-03 ENCOUNTER — Ambulatory Visit (INDEPENDENT_AMBULATORY_CARE_PROVIDER_SITE_OTHER): Payer: Medicaid Other | Admitting: Gastroenterology

## 2011-02-03 VITALS — BP 135/85 | HR 104 | Temp 98.0°F | Ht 66.0 in | Wt 219.6 lb

## 2011-02-03 DIAGNOSIS — R1031 Right lower quadrant pain: Secondary | ICD-10-CM

## 2011-02-03 DIAGNOSIS — K769 Liver disease, unspecified: Secondary | ICD-10-CM

## 2011-02-03 DIAGNOSIS — K7689 Other specified diseases of liver: Secondary | ICD-10-CM

## 2011-02-03 MED ORDER — HYDROCODONE-ACETAMINOPHEN 5-500 MG PO TABS: 1.0000 | ORAL_TABLET | ORAL | Status: DC | PRN

## 2011-02-03 NOTE — Progress Notes (Signed)
Referring Provider: Toma Deiters, MD Primary Care Physician:  Toma Deiters, MD Primary Gastroenterologist: Dr. Jena Gauss   Chief Complaint  Patient presents with  . Pain    on the right side    HPI:   Allison Bolton is a pleasant 40 year old Caucasian female who presents today in follow-up for chronic RLQ/right-sided abdominal pain. She has a hx of chronic constipation, which resulted in a colonoscopy by Dr. Karilyn Cota around 2010, reportedly normal. Only diverticulosis noted. She has hx of multiple abdominal surgeries in the past. Since August of 2011 she has had 3 CT scans. Describes pain as constant, stabbing. BM daily, no constipation, no melena or brbpr. No abdominal distention or complaints of bloating. Movement worsens. Denies lack of appetite. In fact, she was 40 in December 2011; she is now 89. She was referred to Dr. Lovell Sheehan December 2011 for this chronic pain and consideration of laparoscopy with possible appendectomy. She did undergo an umbilical hernia repair at that time.   CT scan Jan 2012: Two nonspecific low attenuation foci within liver (stable from prior exam) July 23: Lipase 30, HFP wnl, no leukocytosis or anemia on CBC   Past Medical History  Diagnosis Date  . Asthma   . Bipolar 1 disorder   . Fibromyalgia   . Kidney stones   . Diverticulitis   . Arthritis   . S/P colonoscopy     Dr. Karilyn Cota, 2010? diverticulosis    Past Surgical History  Procedure Date  . Tonsillectomy and adenoidectomy   . Umbilical hernia repair Dec 2011    Dr. Lovell Sheehan  . Abdominal hysterectomy   . Hemorrhoid surgery   . Cholecystectomy   . Foot surgery   . Left ovarian removal   . Cesarean section   . Right ovarian removal   . Exploratory laparoscopy   . Wisdom teeth removal   . Multiple hernia repairs   . Tubes in ears     Current Outpatient Prescriptions  Medication Sig Dispense Refill  . clonazePAM (KLONOPIN) 1 MG tablet Take 1 mg by mouth 2 (two) times daily.        .  CYCLOBENZAPRINE HCL PO Take 10 mg by mouth 2 (two) times daily.       . diazepam (VALIUM) 10 MG tablet Take 10 mg by mouth 4 (four) times daily - after meals and at bedtime. Takes three times daily as needed and every night      . DULoxetine (CYMBALTA) 20 MG capsule Take 30 mg by mouth 2 (two) times daily.       . DULoxetine (CYMBALTA) 30 MG capsule Take 30 mg by mouth 2 (two) times daily.        Marland Kitchen estrogens, conjugated, (PREMARIN) 0.625 MG tablet Take 0.625 mg by mouth daily. Take daily for 21 days then do not take for 7 days.       Marland Kitchen gabapentin (NEURONTIN) 600 MG tablet Take 600 mg by mouth 4 (four) times daily.        Marland Kitchen omeprazole (PRILOSEC) 20 MG capsule Take 20 mg by mouth daily.        Marland Kitchen oxyCODONE-acetaminophen (PERCOCET) 5-325 MG per tablet Take 1 tablet by mouth every 6 (six) hours as needed for pain.  15 tablet  0  . HYDROcodone-acetaminophen (VICODIN) 5-500 MG per tablet Take 1 tablet by mouth every 4 (four) hours as needed for pain.  20 tablet  0    Allergies as of 02/03/2011 - Review Complete 02/03/2011  Allergen Reaction Noted  .  Amoxicillin Other (See Comments)     Family History  Problem Relation Age of Onset  . Thyroid disease Mother   . Colon cancer      paternal and maternal grandfather    History   Social History  . Marital Status: Single    Spouse Name: N/A    Number of Children: N/A  . Years of Education: N/A   Social History Main Topics  . Smoking status: Current Everyday Smoker  . Smokeless tobacco: None  . Alcohol Use: Yes     occasional  . Drug Use: No  . Sexually Active: None    Review of Systems: Gen: Denies fever, chills, anorexia. Denies fatigue, weakness, weight loss.  CV: Denies chest pain, palpitations, syncope, peripheral edema, and claudication. Resp: Denies dyspnea at rest, cough, wheezing, coughing up blood, and pleurisy. GI: Denies vomiting blood, jaundice, and fecal incontinence.   Denies dysphagia or odynophagia. Derm: Denies rash,  itching, dry skin Psych: Denies depression, anxiety, memory loss, confusion. No homicidal or suicidal ideation.  Heme: Denies bruising, bleeding, and enlarged lymph nodes.  Physical Exam: BP 135/85  Pulse 104  Temp(Src) 98 F (36.7 C) (Temporal)  Ht 5\' 6"  (1.676 m)  Wt 219 lb 9.6 oz (99.61 kg)  BMI 35.44 kg/m2 General:   Alert and oriented. No distress noted. Pleasant and cooperative.  Head:  Normocephalic and atraumatic. Eyes:  Conjuctiva clear without scleral icterus. Mouth:  Oral mucosa pink and moist. Good dentition. No lesions. Neck:  Supple, without mass or thyromegaly. Heart:  S1, S2 present without murmurs, rubs, or gallops. Regular rate and rhythm. Abdomen:  +BS, soft, obese, right-sided tenderness, non-distended. No rebound or guarding. No HSM or masses noted. Possible recurrent umbilical hernia vs abdominal wall weakness Msk:  Symmetrical without gross deformities. Normal posture. Extremities:  Without edema. Neurologic:  Alert and  oriented x4;  grossly normal neurologically. Skin:  Intact without significant lesions or rashes. Cervical Nodes:  No significant cervical adenopathy. Psych:  Alert and cooperative. Normal mood and affect.

## 2011-02-03 NOTE — Patient Instructions (Signed)
We have set you up for an MRI of your liver due to recent findings on CT. We will call you with results.  We have also referred you to Dr. Lovell Sheehan for his opinion.   We will be in contact shortly.

## 2011-02-04 ENCOUNTER — Encounter: Payer: Self-pay | Admitting: Gastroenterology

## 2011-02-04 DIAGNOSIS — K769 Liver disease, unspecified: Secondary | ICD-10-CM | POA: Insufficient documentation

## 2011-02-04 NOTE — Assessment & Plan Note (Signed)
40 year old Caucasian female with hx of chronic right-sided/RLQ pain with a benign work-up thus far. She has had a recent colonoscopy (as of 2010), and 3 CT scans since Aug 2011. There are no acute intraabdominal findings on these exams. She has had multiple abdominal operations, which raises the question of adhesive disease, possible functional abdominal pain. No laboratory abnormalities noted as of July 2012. CT did show low, non-specific foci of liver. Will be assessed with MRI. She denies constipation, N/V. No signs of rectal bleeding. She has actually gained weight.  At this point, we would like to refer her to see Dr. Lovell Sheehan again for evaluation regarding her chronic abdominal pain, need for possible appendectomy. It is highly likely that we are dealing with adhesive disease; she may benefit from pain management/physical therapy. We will await Dr. Lovell Sheehan' recommendations and proceed as appropriate.

## 2011-02-04 NOTE — Progress Notes (Signed)
Cc to PCP 

## 2011-02-04 NOTE — Assessment & Plan Note (Signed)
Noted on several CT scans. Will proceed with MRI to ensure these are benign.

## 2011-02-05 ENCOUNTER — Emergency Department (HOSPITAL_COMMUNITY)
Admission: EM | Admit: 2011-02-05 | Discharge: 2011-02-06 | Disposition: A | Discharge status: Home / Self Care | Payer: Medicaid Other | Attending: Emergency Medicine | Admitting: Emergency Medicine

## 2011-02-05 ENCOUNTER — Telehealth: Payer: Self-pay | Admitting: General Practice

## 2011-02-05 ENCOUNTER — Encounter (HOSPITAL_COMMUNITY): Payer: Self-pay | Admitting: *Deleted

## 2011-02-05 DIAGNOSIS — R1084 Generalized abdominal pain: Secondary | ICD-10-CM

## 2011-02-05 DIAGNOSIS — R1011 Right upper quadrant pain: Secondary | ICD-10-CM | POA: Insufficient documentation

## 2011-02-05 DIAGNOSIS — J45909 Unspecified asthma, uncomplicated: Secondary | ICD-10-CM | POA: Insufficient documentation

## 2011-02-05 DIAGNOSIS — Z87442 Personal history of urinary calculi: Secondary | ICD-10-CM | POA: Insufficient documentation

## 2011-02-05 DIAGNOSIS — IMO0001 Reserved for inherently not codable concepts without codable children: Secondary | ICD-10-CM | POA: Insufficient documentation

## 2011-02-05 DIAGNOSIS — F17200 Nicotine dependence, unspecified, uncomplicated: Secondary | ICD-10-CM | POA: Insufficient documentation

## 2011-02-05 DIAGNOSIS — K769 Liver disease, unspecified: Secondary | ICD-10-CM | POA: Insufficient documentation

## 2011-02-05 DIAGNOSIS — F319 Bipolar disorder, unspecified: Secondary | ICD-10-CM | POA: Insufficient documentation

## 2011-02-05 LAB — COMPREHENSIVE METABOLIC PANEL
BUN: 14 mg/dL (ref 6–23)
CO2: 20 mEq/L (ref 19–32)
Calcium: 9.4 mg/dL (ref 8.4–10.5)
Creatinine, Ser: 0.58 mg/dL (ref 0.50–1.10)
GFR calc Af Amer: >60 mL/min (ref >60)
GFR calc non Af Amer: >60 mL/min (ref >60)
Glucose, Bld: 134 mg/dL — ABNORMAL HIGH (ref 70–99)
Sodium: 139 mEq/L (ref 135–145)
Total Protein: 7 g/dL (ref 6.0–8.3)

## 2011-02-05 LAB — CBC
HCT: 37 % (ref 36.0–46.0)
MCHC: 34.1 g/dL (ref 30.0–36.0)
MCV: 87.7 fL (ref 78.0–100.0)
Platelets: 246 10*3/uL (ref 150–400)
RDW: 13.1 % (ref 11.5–15.5)
WBC: 8.1 10*3/uL (ref 4.0–10.5)

## 2011-02-05 LAB — URINALYSIS, ROUTINE W REFLEX MICROSCOPIC
Bilirubin Urine: NEGATIVE
Hgb urine dipstick: NEGATIVE
Nitrite: NEGATIVE
Protein, ur: NEGATIVE mg/dL
Specific Gravity, Urine: >1.03 — ABNORMAL HIGH (ref 1.005–1.030)
Urobilinogen, UA: 0.2 mg/dL (ref 0.0–1.0)

## 2011-02-05 MED ORDER — HYDROMORPHONE HCL 1 MG/ML IJ SOLN
1.0000 mg | Freq: Once | INTRAMUSCULAR | Status: AC
  Administered 2011-02-05: 1 mg via INTRAVENOUS
  Filled 2011-02-05: qty 1

## 2011-02-05 MED ORDER — OXYCODONE-ACETAMINOPHEN 5-325 MG PO TABS: 1.0000 | ORAL_TABLET | ORAL | Status: AC | PRN

## 2011-02-05 MED ORDER — ONDANSETRON HCL 4 MG/2ML IJ SOLN
4.0000 mg | Freq: Once | INTRAMUSCULAR | Status: AC
  Administered 2011-02-05: 4 mg via INTRAVENOUS
  Filled 2011-02-05: qty 2

## 2011-02-05 MED ORDER — SODIUM CHLORIDE 0.9 % IV SOLN: Freq: Once | INTRAVENOUS | Status: DC

## 2011-02-05 NOTE — ED Notes (Signed)
Abd pain  RUQ and feet swelling.  Alert, Says her pain meds are not helping.Skin warm and dry, color wln. To have an MRI this week for furthur evaluation.

## 2011-02-05 NOTE — Telephone Encounter (Signed)
Pt scheduled to see Dr. Lovell Sheehan 02/11/2011@1 :30pm.  Pt is aware of appt.

## 2011-02-05 NOTE — Telephone Encounter (Signed)
Message copied by Jennings Books on Wed Feb 05, 2011  9:49 AM ------      Message from: Cloria Spring H      Created: Wed Feb 05, 2011  9:46 AM      Regarding: Sagrero, Keliyah       Pt had left message on my VM just to call. I called and she is wanting to know if Durward Mallard has heard from Dr. Lovell Sheehan

## 2011-02-05 NOTE — ED Provider Notes (Signed)
History     CSN: [id number] Arrival date & time: 02/05/2011  8:43 PM  Chief Complaint  Patient presents with  . Abdominal Pain   Patient is a 40 y.o. female presenting with abdominal pain. The history is provided by the patient.  Abdominal Pain The primary symptoms of the illness include abdominal pain. The primary symptoms of the illness do not include fever, fatigue or diarrhea. Primary symptoms comment: pt has been evaluated for abd pain and is to see a surgeon and get an mri.  the pain became  worse today The current episode started more than 2 days ago. The problem has not changed since onset. The patient states that she believes she is currently not pregnant. The patient has not had a change in bowel habit. Symptoms associated with the illness do not include chills, anorexia, hematuria, frequency or back pain. Significant associated medical issues do not include diverticulitis.    Past Medical History  Diagnosis Date  . Asthma   . Bipolar 1 disorder   . Fibromyalgia   . Kidney stones   . Diverticulitis   . Arthritis   . S/P colonoscopy     Dr. Karilyn Cota, 2010? diverticulosis    Past Surgical History  Procedure Date  . Tonsillectomy and adenoidectomy   . Umbilical hernia repair Dec 2011    Dr. Lovell Sheehan  . Abdominal hysterectomy   . Hemorrhoid surgery   . Cholecystectomy   . Foot surgery   . Left ovarian removal   . Cesarean section   . Right ovarian removal   . Exploratory laparoscopy   . Wisdom teeth removal   . Multiple hernia repairs   . Tubes in ears     Family History  Problem Relation Age of Onset  . Thyroid disease Mother   . Colon cancer      paternal and maternal grandfather    History  Substance Use Topics  . Smoking status: Current Everyday Smoker  . Smokeless tobacco: Not on file  . Alcohol Use: Yes     occasional    OB History    Grav Para Term Preterm Abortions TAB SAB Ect Mult Living                  Review of Systems  Constitutional:  Negative for fever, chills and fatigue.  HENT: Negative for congestion, sinus pressure and ear discharge.   Eyes: Negative for discharge.  Respiratory: Negative for cough.   Cardiovascular: Negative for chest pain.  Gastrointestinal: Positive for abdominal pain. Negative for diarrhea and anorexia.  Genitourinary: Negative for frequency and hematuria.  Musculoskeletal: Negative for back pain.  Skin: Negative for rash.  Neurological: Negative for seizures and headaches.  Hematological: Negative.   Psychiatric/Behavioral: Negative for hallucinations.    Physical Exam  BP 134/95  Pulse 100  Temp(Src) 98.5 F (36.9 C) (Oral)  Resp 20  Ht 5\' 6"  (1.676 m)  Wt 218 lb (98.884 kg)  BMI 35.19 kg/m2  SpO2 97%  Physical Exam  Constitutional: She is oriented to person, place, and time. She appears well-developed.  HENT:  Head: Normocephalic and atraumatic.  Eyes: Conjunctivae and EOM are normal. No scleral icterus.  Neck: Neck supple. No thyromegaly present.  Cardiovascular: Normal rate and regular rhythm.  Exam reveals no gallop and no friction rub.   No murmur heard. Pulmonary/Chest: No stridor. She has no wheezes. She has no rales. She exhibits no tenderness.  Abdominal: She exhibits no distension. There is tenderness. There is  no rebound.       Tender ruq  Musculoskeletal: Normal range of motion. She exhibits no edema.  Lymphadenopathy:    She has no cervical adenopathy.  Neurological: She is oriented to person, place, and time. Coordination normal.  Skin: No rash noted. No erythema.  Psychiatric: She has a normal mood and affect. Her behavior is normal.    ED Course  Procedures  MDM abd pain,  Liver disease      Benny Lennert, MD 02/05/11 2345

## 2011-02-05 NOTE — ED Notes (Signed)
Pt continues to have abd pain, was seen on 01/27/2011 without any diagnosis, states that the pain has gotten worse, pt also has swelling to bilateral lower extremities that started two days ago,

## 2011-02-06 ENCOUNTER — Other Ambulatory Visit (HOSPITAL_COMMUNITY): Payer: Medicaid Other

## 2011-02-07 ENCOUNTER — Ambulatory Visit (HOSPITAL_COMMUNITY)
Admission: RE | Admit: 2011-02-07 | Discharge: 2011-02-07 | Disposition: A | Discharge status: Home / Self Care | Payer: Medicaid Other | Source: Ambulatory Visit | Attending: Gastroenterology | Admitting: Gastroenterology

## 2011-02-07 DIAGNOSIS — K7689 Other specified diseases of liver: Secondary | ICD-10-CM | POA: Insufficient documentation

## 2011-02-07 DIAGNOSIS — K769 Liver disease, unspecified: Secondary | ICD-10-CM

## 2011-02-07 MED ORDER — GADOXETATE DISODIUM 0.25 MMOL/ML IV SOLN
10.0000 mL | Freq: Once | INTRAVENOUS | Status: AC | PRN
  Administered 2011-02-07: 10 mL via INTRAVENOUS

## 2011-02-10 NOTE — Progress Notes (Signed)
AGREE

## 2011-02-11 NOTE — H&P (Signed)
Allison Bolton is an 40 y.o. female.   Chief Complaint: Nonspecific abdominal pain HPI: 40 year old white female who suffers from chronic nonspecific right-sided abdominal pain. She has had an extensive workup including multiple CAT scans and an MRI which only shows 2 small liver cysts. Her common bile duct was noted to be at 9 mm, though no choledocholithiasis was seen. She is status post laparoscopic cholecystectomy. The patient states that her pain is crampy and constant in nature. She's been referred to my care for a diagnostic laparoscopy, appendectomy.  Past Medical History  Diagnosis Date  . Asthma   . Bipolar 1 disorder   . Fibromyalgia   . Kidney stones   . Diverticulitis   . Arthritis   . S/P colonoscopy     Dr. Karilyn Bolton, 2010? diverticulosis    Past Surgical History  Procedure Date  . Tonsillectomy and adenoidectomy   . Umbilical hernia repair Dec 2011    Dr. Lovell Bolton  . Abdominal hysterectomy   . Hemorrhoid surgery   . Cholecystectomy   . Foot surgery   . Left ovarian removal   . Cesarean section   . Right ovarian removal   . Exploratory laparoscopy   . Wisdom teeth removal   . Multiple hernia repairs   . Tubes in ears     Family History  Problem Relation Age of Onset  . Thyroid disease Mother   . Colon cancer      paternal and maternal grandfather   Social History:  reports that she has been smoking.  She does not have any smokeless tobacco history on file. She reports that she drinks alcohol. She reports that she does not use illicit drugs.  Allergies:  Allergies  Allergen Reactions  . Amoxicillin Other (See Comments)    Patient states she feels sunburnt when taking amoxicillin    No current facility-administered medications on file as of .   Medications Prior to Admission  Medication Sig Dispense Refill  . clonazePAM (KLONOPIN) 1 MG tablet Take 1 mg by mouth 2 (two) times daily.        . CYCLOBENZAPRINE HCL PO Take 10 mg by mouth 2 (two) times  daily.       . diazepam (VALIUM) 10 MG tablet Take 10 mg by mouth 4 (four) times daily - after meals and at bedtime. Takes three times daily as needed and every night      . DULoxetine (CYMBALTA) 20 MG capsule Take 20 mg by mouth 2 (two) times daily.       . DULoxetine (CYMBALTA) 30 MG capsule Take 60 mg by mouth 2 (two) times daily.       Marland Kitchen estrogens, conjugated, (PREMARIN) 0.625 MG tablet Take 0.625 mg by mouth daily. Take daily for 21 days then do not take for 7 days.       Marland Kitchen gabapentin (NEURONTIN) 600 MG tablet Take 600 mg by mouth 4 (four) times daily.        Marland Kitchen HYDROcodone-acetaminophen (VICODIN) 5-500 MG per tablet Take 1 tablet by mouth every 4 (four) hours as needed for pain.  20 tablet  0  . omeprazole (PRILOSEC) 20 MG capsule Take 20 mg by mouth daily.        Marland Kitchen oxyCODONE-acetaminophen (PERCOCET) 5-325 MG per tablet Take 1 tablet by mouth every 4 (four) hours as needed for pain.  20 tablet  0    No results found for this or any previous visit (from the past 48 hour(s)). No results  found.  Review of Systems  Constitutional: Positive for malaise/fatigue.  HENT: Negative.   Eyes: Negative.   Respiratory: Negative.   Cardiovascular: Negative.   Gastrointestinal: Negative.   Genitourinary: Negative.   Musculoskeletal: Negative.   Skin: Negative.   Neurological: Negative.   Endo/Heme/Allergies: Negative.   Psychiatric/Behavioral: Negative.     There were no vitals taken for this visit. Physical Exam  Constitutional: She is oriented to person, place, and time. She appears well-developed and well-nourished.  HENT:  Head: Normocephalic.  Eyes: Pupils are equal, round, and reactive to light.  Neck: Normal range of motion. Neck supple.  Cardiovascular: Normal rate, regular rhythm and normal heart sounds.   Respiratory: Effort normal and breath sounds normal.  GI: Soft. Bowel sounds are normal. There is tenderness.  Neurological: She is alert and oriented to person, place, and  time.  Skin: Skin is warm and dry.     Assessment/Plan Assessment: Chronic right-sided abdominal pain  Plan: The patient is scheduled undergo a diagnostic laparoscopy, appendectomy on 02/19/2011. The risks and benefits of the procedure including bleeding, infection, and a possibly recurrence of the pain were fully explained to the patient, gave informed consent.  Allison Bolton A 02/11/2011, 5:52 PM

## 2011-02-14 ENCOUNTER — Encounter (HOSPITAL_COMMUNITY): Payer: Self-pay

## 2011-02-14 ENCOUNTER — Encounter (HOSPITAL_COMMUNITY)
Admission: RE | Admit: 2011-02-14 | Discharge: 2011-02-14 | Disposition: A | Discharge status: Home / Self Care | Payer: Medicaid Other | Source: Ambulatory Visit | Attending: General Surgery | Admitting: General Surgery

## 2011-02-14 HISTORY — DX: Shortness of breath: R06.02

## 2011-02-14 HISTORY — DX: Cardiac arrhythmia, unspecified: I49.9

## 2011-02-14 LAB — SURGICAL PCR SCREEN
MRSA, PCR: NEGATIVE
Staphylococcus aureus: POSITIVE — AB

## 2011-02-14 MED ORDER — LACTATED RINGERS IV SOLN: INTRAVENOUS | Status: DC

## 2011-02-14 NOTE — Patient Instructions (Signed)
20 Allison Bolton  02/14/2011   Your procedure is scheduled on: 02/19/11  Report to [facility] at [place] [facility].  Call this number if you have problems the morning of surgery: [phone]   Remember:   Do not eat food:After Midnight.  Do not drink clear liquids: After Midnight.  Take these medicines the morning of surgery with A SIP OF WATER:cymbalta, klonopin, valium, neurontin, prilosec, percocet if needed.   Do not wear jewelry, make-up or nail polish.  Do not wear lotions, powders, or perfumes. You may wear deodorant.  Do not shave 48 hours prior to surgery.  Do not bring valuables to the hospital.  Contacts, dentures or bridgework may not be worn into surgery.  Leave suitcase in the car. After surgery it may be brought to your room.  For patients admitted to the hospital, checkout time is 11:00 AM the day of discharge.   Patients discharged the day of surgery will not be allowed to drive home.  Name and phone number of your driver:family  Special Instructions: CHG Shower Use Special Wash: 1/2 bottle night before surgery and 1/2 bottle morning of surgery.   Please read over the following fact sheets that you were given: Pain Booklet, MRSA Information, Surgical Site Infection Prevention, Anesthesia Post-op Instructions and Care and Recovery After Surgery   PATIENT INSTRUCTIONS POST-ANESTHESIA  IMMEDIATELY FOLLOWING SURGERY:  Do not drive or operate machinery for the first twenty four hours after surgery.  Do not make any important decisions for twenty four hours after surgery or while taking narcotic pain medications or sedatives.  If you develop intractable nausea and vomiting or a severe headache please notify your doctor immediately.  FOLLOW-UP:  Please make an appointment with your surgeon as instructed. You do not need to follow up with anesthesia unless specifically instructed to do so.  WOUND CARE INSTRUCTIONS (if applicable):  Keep a dry clean dressing on the anesthesia/puncture  wound site if there is drainage.  Once the wound has quit draining you may leave it open to air.  Generally you should leave the bandage intact for twenty four hours unless there is drainage.  If the epidural site drains for more than 36-48 hours please call the anesthesia department.  QUESTIONS?:  Please feel free to call your physician or the hospital operator if you have any questions, and they will be happy to assist you.     [facility] Anesthesia Department [address] [place] [phone]

## 2011-02-17 ENCOUNTER — Telehealth: Payer: Self-pay

## 2011-02-17 NOTE — Telephone Encounter (Signed)
Thanks. Looks like she is scheduled for surgery the 15th with Dr. Lovell Sheehan as well.

## 2011-02-17 NOTE — Telephone Encounter (Signed)
Pt called and said she was seen in ER in [place] recently. Had x-ray of abdomen and was told she had a lot of poop and some blockage. She was given Miralax, but never did get a lot of results. I have scheduled for Ov with Gerrit Halls, NP on 02/18/2011 at 8 AM.

## 2011-02-18 ENCOUNTER — Ambulatory Visit (HOSPITAL_COMMUNITY)
Admission: RE | Admit: 2011-02-18 | Discharge: 2011-02-18 | Disposition: A | Discharge status: Home / Self Care | Payer: Medicaid Other | Source: Ambulatory Visit | Attending: Gastroenterology | Admitting: Gastroenterology

## 2011-02-18 ENCOUNTER — Encounter: Payer: Self-pay | Admitting: Gastroenterology

## 2011-02-18 ENCOUNTER — Ambulatory Visit (INDEPENDENT_AMBULATORY_CARE_PROVIDER_SITE_OTHER): Payer: Medicaid Other | Admitting: Gastroenterology

## 2011-02-18 DIAGNOSIS — R109 Unspecified abdominal pain: Secondary | ICD-10-CM | POA: Insufficient documentation

## 2011-02-18 DIAGNOSIS — K5900 Constipation, unspecified: Secondary | ICD-10-CM

## 2011-02-18 DIAGNOSIS — K5909 Other constipation: Secondary | ICD-10-CM | POA: Insufficient documentation

## 2011-02-18 NOTE — Progress Notes (Signed)
Quick Note:  LMOM to call. ______ 

## 2011-02-18 NOTE — Progress Notes (Signed)
Quick Note:  Pt returned call and was informed. ______ 

## 2011-02-18 NOTE — Progress Notes (Signed)
Referring Provider: Toma Deiters, MD Primary Care Physician:  Toma Deiters, MD  Chief Complaint  Patient presents with  . Abdominal Pain    when to the er on the 11th in [place]    HPI:   [name] is a 40 year old Caucasian female who was actually recently seen by our office. She has a history of chronic right-sided abdominal pain, multiple prior abdominal surgeries. She is actually scheduled for an exploratory laparoscopy and possible appendectomy tomorrow with Dr. Lovell Sheehan.  She presents today with complaints of constipation. Reports going to the [facility] ED on Aug 11 with worsening right-sided abdominal pain. Denies N/V. States was told she was full of stool, "had a blockage". We do not have these reports at the time of visit. Per her description, it sounds like she was given a bowel prep. She had 5 BMs. Some were loose. Small amount of brbpr. She has had a colonoscopy in 2010 with Dr. Karilyn Cota. Do not have these reports currently, although they have been requested. She has taken Amitiza in the past.  She is now having a BM every day. Last one this morning. Last 2 weeks, stools have been hard. Not taking a probiotic.  She is afebrile. Denies any past fever/chills.    Past Medical History  Diagnosis Date  . Asthma   . Bipolar 1 disorder   . Fibromyalgia   . Kidney stones   . Diverticulitis   . Arthritis   . S/P colonoscopy     Dr. Karilyn Cota, 2010? diverticulosis  . Dysrhythmia     sts "I have heart palpitations"  . Shortness of breath     Past Surgical History  Procedure Date  . Tonsillectomy and adenoidectomy   . Umbilical hernia repair Dec 2011    Dr. Lovell Sheehan  . Abdominal hysterectomy   . Hemorrhoid surgery   . Cholecystectomy   . Foot surgery   . Left ovarian removal   . Cesarean section   . Right ovarian removal   . Exploratory laparoscopy   . Wisdom teeth removal   . Multiple hernia repairs   . Tubes in ears   . Tonsillectomy     Current  Outpatient Prescriptions  Medication Sig Dispense Refill  . clonazePAM (KLONOPIN) 1 MG tablet Take 1 mg by mouth 2 (two) times daily.        . CYCLOBENZAPRINE HCL PO Take 10 mg by mouth 2 (two) times daily.       . diazepam (VALIUM) 10 MG tablet Take 10 mg by mouth 4 (four) times daily - after meals and at bedtime. Takes three times daily as needed and every night      . DULoxetine (CYMBALTA) 30 MG capsule Take 30 mg by mouth 2 (two) times daily.        Marland Kitchen estrogens, conjugated, (PREMARIN) 0.625 MG tablet Take 0.625 mg by mouth daily.       Marland Kitchen gabapentin (NEURONTIN) 600 MG tablet Take 600 mg by mouth 4 (four) times daily.              Marland Kitchen omeprazole (PRILOSEC) 20 MG capsule Take 20 mg by mouth daily.        . DULoxetine (CYMBALTA) 20 MG capsule Take 20 mg by mouth 2 (two) times daily.       . DULoxetine (CYMBALTA) 30 MG capsule Take 60 mg by mouth 2 (two) times daily.         Allergies as of 02/18/2011 - Review Complete 02/18/2011  Allergen Reaction Noted  . Amoxicillin Other (See Comments)     Family History  Problem Relation Age of Onset  . Thyroid disease Mother   . Colon cancer      paternal and maternal grandfather  . Anesthesia problems Neg Hx   . Hypotension Neg Hx   . Malignant hyperthermia Neg Hx   . Pseudochol deficiency Neg Hx     History   Social History  . Marital Status: Single    Spouse Name: N/A    Number of Children: N/A  . Years of Education: N/A   Social History Main Topics  . Smoking status: Current Everyday Smoker -- 0.5 packs/day for 20 years    Types: Cigarettes  . Smokeless tobacco: None  . Alcohol Use: Yes     occasional  . Drug Use: No  . Sexually Active: Yes    Birth Control/ Protection: Surgical   Other Topics Concern    Review of Systems: Gen: Denies fever, chills, anorexia. Denies fatigue, weakness, weight loss.  CV: Denies chest pain, palpitations, syncope, peripheral edema, and claudication. Resp: Denies dyspnea at rest, cough,  wheezing, coughing up blood, and pleurisy. GI: Denies vomiting blood, jaundice, and fecal incontinence.   Denies dysphagia or odynophagia. Derm: Denies rash, itching, dry skin Psych: Denies depression, anxiety, memory loss, confusion. No homicidal or suicidal ideation.  Heme: Denies bruising, bleeding, and enlarged lymph nodes.  Physical Exam: BP 135/88  Pulse 88  Temp(Src) 98 F (36.7 C) (Temporal)  Ht 5\' 6"  (1.676 m)  Wt 221 lb (100.245 kg)  BMI 35.67 kg/m2 General:   Alert and oriented. No distress noted. Pleasant and cooperative.  Head:  Normocephalic and atraumatic. Eyes:  Conjuctiva clear without scleral icterus. Mouth:  Oral mucosa pink and moist. Good dentition. No lesions. Heart:  S1, S2 present without murmurs, rubs, or gallops. Regular rate and rhythm. Abdomen:  +BS, soft, no significant tenderness, slightly distended (objectively) than last visit. No rebound or guarding. No HSM or masses noted. Msk:  Symmetrical without gross deformities. Normal posture. Extremities:  Without edema. Neurologic:  Alert and  oriented x4;  grossly normal neurologically. Skin:  Intact without significant lesions or rashes. Psych:  Alert and cooperative. Normal mood and affect.

## 2011-02-18 NOTE — Progress Notes (Signed)
Pt returned call and was informed.  

## 2011-02-18 NOTE — Assessment & Plan Note (Signed)
40 year old Caucasian female with hx of constipation. Reports presenting to [facility] ED due to worsening right-sided pain. Given ?bowel prep with moderate response. Now BM daily. States still feels bloated. Not on probiotic. Scheduled for surgery tomorrow. Denies fever/chills. Do not have labs/xrays from [place] at time of visit. Hesitant to start Amitiza until films reviewed. Will likely need in in future if no contraindication. Will assess with updated abd xray today. As of note, pt denies any fever/chills, N/V. Doubt we are dealing with any other process than constipation. Will cc Dr. Lovell Sheehan so he is aware of pt's status.   Flat plate abdomen today Consider Amitiza in the future Retrieve colonoscopy reports from [facility]  No need for CT scan unless change in patient status or if review of [place] records dictates otherwise.

## 2011-02-18 NOTE — Progress Notes (Signed)
Cc to PCP 

## 2011-02-18 NOTE — Progress Notes (Signed)
Quick Note:  Please tell pt there is no obstruction. Minimal stool. No blockages. She has surgery tomorrow. I do not have the reports from [place] yet. ______

## 2011-02-18 NOTE — Patient Instructions (Signed)
Please complete xray today. We will call you with results.  We are waiting on the reports from [place]. We will contact you if anything needs to be done after review of this.  We may be starting Amitiza after receiving the reports. We will let you know.  Seek medical attention if you start having increased pain, nausea and vomiting, swollen abdomen, fever.

## 2011-02-19 ENCOUNTER — Encounter (HOSPITAL_COMMUNITY)
Admission: RE | Disposition: A | Discharge status: Home / Self Care | Payer: Self-pay | Source: Ambulatory Visit | Attending: General Surgery

## 2011-02-19 ENCOUNTER — Ambulatory Visit (HOSPITAL_COMMUNITY): Payer: Medicaid Other | Admitting: Anesthesiology

## 2011-02-19 ENCOUNTER — Encounter (HOSPITAL_COMMUNITY): Payer: Self-pay | Admitting: Anesthesiology

## 2011-02-19 ENCOUNTER — Encounter (HOSPITAL_COMMUNITY): Payer: Self-pay | Admitting: *Deleted

## 2011-02-19 ENCOUNTER — Other Ambulatory Visit: Payer: Self-pay | Admitting: General Surgery

## 2011-02-19 ENCOUNTER — Ambulatory Visit (HOSPITAL_COMMUNITY)
Admission: RE | Admit: 2011-02-19 | Discharge: 2011-02-19 | Disposition: A | Discharge status: Home / Self Care | Payer: Medicaid Other | Source: Ambulatory Visit | Attending: General Surgery | Admitting: General Surgery

## 2011-02-19 DIAGNOSIS — R1011 Right upper quadrant pain: Secondary | ICD-10-CM | POA: Insufficient documentation

## 2011-02-19 DIAGNOSIS — Z01818 Encounter for other preprocedural examination: Secondary | ICD-10-CM | POA: Insufficient documentation

## 2011-02-19 DIAGNOSIS — K36 Other appendicitis: Secondary | ICD-10-CM | POA: Insufficient documentation

## 2011-02-19 DIAGNOSIS — Z01812 Encounter for preprocedural laboratory examination: Secondary | ICD-10-CM | POA: Insufficient documentation

## 2011-02-19 DIAGNOSIS — R1031 Right lower quadrant pain: Secondary | ICD-10-CM | POA: Insufficient documentation

## 2011-02-19 HISTORY — PX: LAPAROSCOPY: SHX197

## 2011-02-19 HISTORY — PX: LAPAROSCOPIC APPENDECTOMY: SHX408

## 2011-02-19 SURGERY — LAPAROSCOPY, DIAGNOSTIC
Anesthesia: General | Site: Abdomen | Wound class: Clean Contaminated

## 2011-02-19 MED ORDER — SODIUM CHLORIDE 0.9 % IV SOLN
INTRAVENOUS | Status: AC
  Filled 2011-02-19: qty 1

## 2011-02-19 MED ORDER — HYDROCODONE-ACETAMINOPHEN 5-325 MG PO TABS: ORAL_TABLET | ORAL | Status: DC

## 2011-02-19 MED ORDER — PROPOFOL 10 MG/ML IV EMUL
INTRAVENOUS | Status: DC | PRN
  Administered 2011-02-19: 150 mg via INTRAVENOUS
  Administered 2011-02-19 (×2): 50 mg via INTRAVENOUS

## 2011-02-19 MED ORDER — FENTANYL CITRATE 0.05 MG/ML IJ SOLN
INTRAMUSCULAR | Status: AC
  Administered 2011-02-19: 50 ug via INTRAVENOUS
  Filled 2011-02-19: qty 5

## 2011-02-19 MED ORDER — PROPOFOL 10 MG/ML IV EMUL
INTRAVENOUS | Status: AC
  Filled 2011-02-19: qty 20

## 2011-02-19 MED ORDER — ENOXAPARIN SODIUM 40 MG/0.4ML ~~LOC~~ SOLN
SUBCUTANEOUS | Status: AC
  Administered 2011-02-19: 40 mg via SUBCUTANEOUS
  Filled 2011-02-19: qty 0.4

## 2011-02-19 MED ORDER — FENTANYL CITRATE 0.05 MG/ML IJ SOLN
INTRAMUSCULAR | Status: AC
  Filled 2011-02-19: qty 2

## 2011-02-19 MED ORDER — BUPIVACAINE HCL (PF) 0.25 % IJ SOLN
INTRAMUSCULAR | Status: DC | PRN
  Administered 2011-02-19: 10 mL

## 2011-02-19 MED ORDER — ONDANSETRON HCL 4 MG/2ML IJ SOLN: 4.0000 mg | Freq: Once | INTRAMUSCULAR | Status: DC | PRN

## 2011-02-19 MED ORDER — KETOROLAC TROMETHAMINE 30 MG/ML IJ SOLN
30.0000 mg | Freq: Once | INTRAMUSCULAR | Status: AC
  Administered 2011-02-19: 30 mg via INTRAVENOUS

## 2011-02-19 MED ORDER — SODIUM CHLORIDE 0.9 % IV SOLN
1.0000 g | INTRAVENOUS | Status: AC
  Administered 2011-02-19: 1 g via INTRAVENOUS
  Filled 2011-02-19: qty 1

## 2011-02-19 MED ORDER — ONDANSETRON HCL 4 MG/2ML IJ SOLN
4.0000 mg | Freq: Once | INTRAMUSCULAR | Status: AC
  Administered 2011-02-19: 4 mg via INTRAVENOUS

## 2011-02-19 MED ORDER — ROCURONIUM BROMIDE 100 MG/10ML IV SOLN
INTRAVENOUS | Status: DC | PRN
  Administered 2011-02-19: 25 mg via INTRAVENOUS
  Administered 2011-02-19: 5 mg via INTRAVENOUS

## 2011-02-19 MED ORDER — MIDAZOLAM HCL 2 MG/2ML IJ SOLN
INTRAMUSCULAR | Status: AC
  Administered 2011-02-19: 2 mg via INTRAVENOUS
  Filled 2011-02-19: qty 2

## 2011-02-19 MED ORDER — LACTATED RINGERS IV SOLN
INTRAVENOUS | Status: DC
  Administered 2011-02-19 (×2): via INTRAVENOUS

## 2011-02-19 MED ORDER — FENTANYL CITRATE 0.05 MG/ML IJ SOLN
INTRAMUSCULAR | Status: DC | PRN
  Administered 2011-02-19: 50 ug via INTRAVENOUS
  Administered 2011-02-19: 100 ug via INTRAVENOUS
  Administered 2011-02-19: 50 ug via INTRAVENOUS
  Administered 2011-02-19: 100 ug via INTRAVENOUS
  Administered 2011-02-19: 50 ug via INTRAVENOUS

## 2011-02-19 MED ORDER — ENOXAPARIN SODIUM 40 MG/0.4ML ~~LOC~~ SOLN
40.0000 mg | Freq: Once | SUBCUTANEOUS | Status: AC
  Administered 2011-02-19: 40 mg via SUBCUTANEOUS

## 2011-02-19 MED ORDER — FENTANYL CITRATE 0.05 MG/ML IJ SOLN
INTRAMUSCULAR | Status: AC
  Administered 2011-02-19: 50 ug via INTRAVENOUS
  Filled 2011-02-19: qty 2

## 2011-02-19 MED ORDER — SUCCINYLCHOLINE CHLORIDE 20 MG/ML IJ SOLN
INTRAMUSCULAR | Status: AC
  Filled 2011-02-19: qty 1

## 2011-02-19 MED ORDER — SUCCINYLCHOLINE CHLORIDE 20 MG/ML IJ SOLN
INTRAMUSCULAR | Status: DC | PRN
  Administered 2011-02-19: 120 mg via INTRAVENOUS

## 2011-02-19 MED ORDER — BUPIVACAINE HCL (PF) 0.5 % IJ SOLN
INTRAMUSCULAR | Status: AC
  Filled 2011-02-19: qty 30

## 2011-02-19 MED ORDER — GLYCOPYRROLATE 0.2 MG/ML IJ SOLN
INTRAMUSCULAR | Status: AC
  Administered 2011-02-19: 0.2 mg via INTRAVENOUS
  Filled 2011-02-19: qty 1

## 2011-02-19 MED ORDER — NEOSTIGMINE METHYLSULFATE 1 MG/ML IJ SOLN
INTRAMUSCULAR | Status: DC | PRN
  Administered 2011-02-19: 2 mg via INTRAMUSCULAR

## 2011-02-19 MED ORDER — GLYCOPYRROLATE 0.2 MG/ML IJ SOLN
INTRAMUSCULAR | Status: AC
  Filled 2011-02-19: qty 1

## 2011-02-19 MED ORDER — MIDAZOLAM HCL 2 MG/2ML IJ SOLN
INTRAMUSCULAR | Status: AC
  Filled 2011-02-19: qty 2

## 2011-02-19 MED ORDER — ONDANSETRON HCL 4 MG/2ML IJ SOLN
INTRAMUSCULAR | Status: AC
  Administered 2011-02-19: 4 mg via INTRAVENOUS
  Filled 2011-02-19: qty 2

## 2011-02-19 MED ORDER — SODIUM CHLORIDE 0.9 % IR SOLN
Status: DC | PRN
  Administered 2011-02-19: 1000 mL

## 2011-02-19 MED ORDER — MIDAZOLAM HCL 2 MG/2ML IJ SOLN
1.0000 mg | INTRAMUSCULAR | Status: DC | PRN
  Administered 2011-02-19 (×2): 2 mg via INTRAVENOUS

## 2011-02-19 MED ORDER — GLYCOPYRROLATE 0.2 MG/ML IJ SOLN
0.2000 mg | Freq: Once | INTRAMUSCULAR | Status: AC
  Administered 2011-02-19: 0.2 mg via INTRAVENOUS

## 2011-02-19 MED ORDER — LIDOCAINE HCL (PF) 1 % IJ SOLN
INTRAMUSCULAR | Status: AC
  Filled 2011-02-19: qty 5

## 2011-02-19 MED ORDER — FENTANYL CITRATE 0.05 MG/ML IJ SOLN
25.0000 ug | INTRAMUSCULAR | Status: DC | PRN
  Administered 2011-02-19 (×2): 50 ug via INTRAVENOUS

## 2011-02-19 MED ORDER — KETOROLAC TROMETHAMINE 30 MG/ML IJ SOLN
INTRAMUSCULAR | Status: AC
  Filled 2011-02-19: qty 1

## 2011-02-19 MED ORDER — ROCURONIUM BROMIDE 50 MG/5ML IV SOLN
INTRAVENOUS | Status: AC
  Filled 2011-02-19: qty 1

## 2011-02-19 MED ORDER — NEOSTIGMINE METHYLSULFATE 1 MG/ML IJ SOLN
INTRAMUSCULAR | Status: AC
  Filled 2011-02-19: qty 10

## 2011-02-19 MED ORDER — GLYCOPYRROLATE 0.2 MG/ML IJ SOLN
INTRAMUSCULAR | Status: DC | PRN
  Administered 2011-02-19: .4 mg via INTRAVENOUS

## 2011-02-19 SURGICAL SUPPLY — 46 items
APPLIER CLIP ROT 10 11.4 M/L (STAPLE) ×2
BAG HAMPER (MISCELLANEOUS) ×2 IMPLANT
CLIP APPLIE ROT 10 11.4 M/L (STAPLE) ×1 IMPLANT
CLOTH BEACON ORANGE TIMEOUT ST (SAFETY) ×2 IMPLANT
COVER LIGHT HANDLE STERIS (MISCELLANEOUS) ×4 IMPLANT
CUTTER ENDO LINEAR 45M (STAPLE) IMPLANT
CUTTER LINEAR ENDO 35 ETS (STAPLE) IMPLANT
CUTTER LINEAR ENDO 35 ETS TH (STAPLE) ×2 IMPLANT
DECANTER SPIKE VIAL GLASS SM (MISCELLANEOUS) ×2 IMPLANT
DISSECTOR BLUNT TIP ENDO 5MM (MISCELLANEOUS) IMPLANT
DURAPREP 26ML APPLICATOR (WOUND CARE) ×4 IMPLANT
ELECT REM PT RETURN 9FT ADLT (ELECTROSURGICAL) ×2
ELECTRODE REM PT RTRN 9FT ADLT (ELECTROSURGICAL) ×1 IMPLANT
FILTER SMOKE EVAC LAPAROSHD (FILTER) ×2 IMPLANT
FORMALIN 10 PREFIL 120ML (MISCELLANEOUS) ×2 IMPLANT
FORMALIN 10 PREFIL 480ML (MISCELLANEOUS) IMPLANT
GLOVE BIO SURGEON STRL SZ7.5 (GLOVE) ×2 IMPLANT
GOWN BRE IMP SLV AUR XL STRL (GOWN DISPOSABLE) ×6 IMPLANT
INST SET LAPROSCOPIC AP (KITS) ×2 IMPLANT
IV NS IRRIG 3000ML ARTHROMATIC (IV SOLUTION) IMPLANT
KIT ROOM TURNOVER APOR (KITS) ×2 IMPLANT
LIGASURE LAP ATLAS 10MM 37CM (INSTRUMENTS) ×2 IMPLANT
MANIFOLD NEPTUNE II (INSTRUMENTS) ×2 IMPLANT
NEEDLE INSUFFLATION 14GA 120MM (NEEDLE) ×2 IMPLANT
NS IRRIG 1000ML POUR BTL (IV SOLUTION) ×2 IMPLANT
PACK LAP CHOLE LZT030E (CUSTOM PROCEDURE TRAY) ×2 IMPLANT
PAD ARMBOARD 7.5X6 YLW CONV (MISCELLANEOUS) ×2 IMPLANT
POUCH SPECIMEN RETRIEVAL 10MM (ENDOMECHANICALS) ×2 IMPLANT
RELOAD /EVU35 (ENDOMECHANICALS) IMPLANT
RELOAD 45 VASCULAR/THIN (ENDOMECHANICALS) IMPLANT
RELOAD CUTTER ETS 35MM STAND (ENDOMECHANICALS) IMPLANT
SCALPEL HARMONIC ACE (MISCELLANEOUS) ×2 IMPLANT
SET BASIN LINEN APH (SET/KITS/TRAYS/PACK) ×2 IMPLANT
SET TUBE IRRIG SUCTION NO TIP (IRRIGATION / IRRIGATOR) ×2 IMPLANT
SPONGE GAUZE 2X2 8PLY STRL LF (GAUZE/BANDAGES/DRESSINGS) ×2 IMPLANT
STAPLER VISISTAT (STAPLE) ×2 IMPLANT
SUT VICRYL 0 UR6 27IN ABS (SUTURE) ×2 IMPLANT
TAPE CLOTH SURG 4X10 WHT LF (GAUZE/BANDAGES/DRESSINGS) ×2 IMPLANT
TOWEL OR 17X26 4PK STRL BLUE (TOWEL DISPOSABLE) ×2 IMPLANT
TRAY FOLEY CATH 14FR (SET/KITS/TRAYS/PACK) ×2 IMPLANT
TROCAR Z-THAD FIOS HNDL 12X100 (TROCAR) ×2 IMPLANT
TROCAR Z-THRD FIOS HNDL 11X100 (TROCAR) ×2 IMPLANT
TROCAR Z-THREAD FIOS 5X100MM (TROCAR) ×2 IMPLANT
TROCAR Z-THREAD SLEEVE 11X100 (TROCAR) ×2 IMPLANT
WARMER LAPAROSCOPE (MISCELLANEOUS) ×2 IMPLANT
YANKAUER SUCT BULB TIP 10FT TU (MISCELLANEOUS) ×2 IMPLANT

## 2011-02-19 NOTE — Transfer of Care (Signed)
Immediate Anesthesia Transfer of Care Note  Patient: Allison Bolton  Procedure(s) Performed:  LAPAROSCOPY DIAGNOSTIC; APPENDECTOMY LAPAROSCOPIC  Patient Location: PACU  Anesthesia Type: General  Level of Consciousness: awake  Airway & Oxygen Therapy: Patient Spontanous Breathing and non-rebreather face mask  Post-op Assessment: Report given to PACU RN, Post -op Vital signs reviewed and stable and Patient moving all extremities  Post vital signs: Reviewed and stable  Complications: No apparent anesthesia complications

## 2011-02-19 NOTE — Anesthesia Procedure Notes (Signed)
Procedure Name: Intubation Date/Time: 02/19/2011 7:51 AM Performed by: Minerva Areola Pre-anesthesia Checklist: Patient identified, Patient being monitored, Timeout performed, Emergency Drugs available and Suction available Patient Re-evaluated:Patient Re-evaluated prior to inductionOxygen Delivery Method: Circle System Utilized Preoxygenation: Pre-oxygenation with 100% oxygen Intubation Type: Rapid sequence and Circoid Pressure applied Ventilation: Mask ventilation without difficulty Laryngoscope Size: Miller and 2 Grade View: Grade I Tube type: Oral Tube size: 7.0 mm Number of attempts: 1 Airway Equipment and Method: stylet Placement Confirmation: ETT inserted through vocal cords under direct vision,  positive ETCO2 and breath sounds checked- equal and bilateral Secured at: 21 cm Tube secured with: Tape Dental Injury: Teeth and Oropharynx as per pre-operative assessment

## 2011-02-19 NOTE — Op Note (Signed)
Patient:  Allison Bolton  DOB:  1971/02/03  MRN:  [id number]   Preop Diagnosis:  Chronic right-sided abdominal pain, chronic appendicitis  Postop Diagnosis:  Same  Procedure:  Diagnostic laparoscopy, laparoscopic appendectomy  Surgeon:  Franky Macho, M.D.  Anes:  Gen. endotracheal  Indications:  Patient is a 40 year old white female is had multiple abdominal surgeries in the past who presents with ongoing right-sided abdominal pain. There is a question of whether the patient has chronic appendicitis. The patient has been referred for diagnostic laparoscopy. Her appendix will be removed in order to simplify any further diagnostic testing. The risks and benefits of the procedure including bleeding, infection, and recurrent pain were fully explained to the patient, gave informed consent.  Procedure note:  Patient was placed in the supine position. After induction of general endotracheal anesthesia, the abdomen was prepped and draped using the usual sterile technique with DuraPrep. Surgical site confirmation was performed. Of note was the fact that anesthesia for left-sided hyperplasia around the tonsillar region during induction.  A supraumbilical incision was made down to the fascia. A Veress needle was introduced into the abdominal cavity and confirmation of placement was done using the saline drop test. The abdomen was then insufflated to 16 mm mercury pressure. An 11 mm trocar was introduced into the abdominal cavity under direct visualization without difficulty. The patient was placed in deeper Trendelenburg position and an additional 12 mm trocar was placed in the suprapubic region a 5 mm trocar was placed left lower quadrant region. The terminal ileum was inspected and noted within normal limits. The was no evidence of inflammatory bowel disease. The distal small bowel was inspected and noted to be within normal limits. No Meckel's diverticulum seen. The liver was inspected and a small  previously known hemangioma was noted on the left lobe of liver. Was no significant adhesive disease in the pelvis or right lower quadrant. The appendix was noted elongated and somewhat sclerotic. It was elected to proceed with an appendectomy. The mesoappendix was divided using the harmonic scalpel. A vascular Endo GIA was placed across the base the appendix and fired. The appendix was then removed using an Endo Catch bag without difficulty. The staple line was inspected and noted to be within  normal limits. All air was then evacuated from the abdominal cavity prior to removal of the trochars.  All wounds were gave normal saline. All wounds were checked with 0.5% Sensorcaine. The supraumbilical fascia as well as suprapubic fascia reapproximated using 0 Vicryl interrupted sutures. All skin incisions were closed using staples. Betadine ointment dry sterile dressings were applied.  All tape and needle counts were correct at the end of the procedure. The patient was extubated in the operating room and went back to recovery room awake in stable condition.  Complications:  None  EBL:  Minimal  Specimen:  Appendix

## 2011-02-19 NOTE — Anesthesia Preprocedure Evaluation (Addendum)
Anesthesia Evaluation   Patient awake  General Assessment Comment  Reviewed: Allergy & Precautions, H&P , NPO status , Patient's Chart, lab work & pertinent test results  History of Anesthesia Complications Negative for: history of anesthetic complications  Airway Mallampati: II  Neck ROM: Full    Dental  (+) Teeth Intact   Pulmonary (+) shortness of breath and With exertion asthmaCOPD COPD inhaler  + decreased breath sounds  decreased breath sounds none    Cardiovascular Regular Normal    Neuro/Psych    (+) Anxiety, Depression, Bipolar Disorder,    GI/Hepatic/Renal (+)  GERD Medicated and Controlled     Endo/Other    Abdominal   Musculoskeletal  (+) Fibromyalgia -  Hematology   Peds  Reproductive/Obstetrics    Anesthesia Other Findings             Anesthesia Physical Anesthesia Plan  ASA: II  Anesthesia Plan: General   Post-op Pain Management:    Induction: Intravenous, Rapid sequence and Cricoid pressure planned  Airway Management Planned: Oral ETT  Additional Equipment:   Intra-op Plan:   Post-operative Plan:   Informed Consent: I have reviewed the patients History and Physical, chart, labs and discussed the procedure including the risks, benefits and alternatives for the proposed anesthesia with the patient or authorized representative who has indicated his/her understanding and acceptance.   Dental Advisory Given  Plan Discussed with:   Anesthesia Plan Comments:        Anesthesia Quick Evaluation

## 2011-02-19 NOTE — Interval H&P Note (Signed)
I have evaluated Allison Bolton preoperatively, and have identified no interval change in the medical condition or plan of care since the history and physical of record.

## 2011-02-19 NOTE — Anesthesia Postprocedure Evaluation (Signed)
Anesthesia Post Note  Patient: Allison Bolton  Procedure(s) Performed:  LAPAROSCOPY DIAGNOSTIC; APPENDECTOMY LAPAROSCOPIC  Anesthesia type: General  Patient location: PACU  Post pain: Pain level controlled  Post assessment: Post-op Vital signs reviewed, Patient's Cardiovascular Status Stable, Respiratory Function Stable, Patent Airway, No signs of Nausea or vomiting and Pain level controlled  Last Vitals:  Filed Vitals:   02/19/11 0843  BP: 135/87  Pulse: 86  Temp: 97.8 F (36.6 C)  Resp: 10    Post vital signs: Reviewed and stable  Level of consciousness: awake and alert   Complications: No apparent anesthesia complications

## 2011-02-25 ENCOUNTER — Encounter (HOSPITAL_COMMUNITY): Payer: Self-pay | Admitting: General Surgery

## 2011-02-28 ENCOUNTER — Emergency Department (HOSPITAL_COMMUNITY)
Admission: EM | Admit: 2011-02-28 | Discharge: 2011-02-28 | Disposition: A | Discharge status: Home / Self Care | Payer: Medicaid Other | Attending: Emergency Medicine | Admitting: Emergency Medicine

## 2011-02-28 ENCOUNTER — Encounter (HOSPITAL_COMMUNITY): Payer: Self-pay | Admitting: *Deleted

## 2011-02-28 ENCOUNTER — Emergency Department (HOSPITAL_COMMUNITY): Payer: Medicaid Other

## 2011-02-28 DIAGNOSIS — R42 Dizziness and giddiness: Secondary | ICD-10-CM

## 2011-02-28 DIAGNOSIS — R5381 Other malaise: Secondary | ICD-10-CM | POA: Insufficient documentation

## 2011-02-28 DIAGNOSIS — Z87442 Personal history of urinary calculi: Secondary | ICD-10-CM | POA: Insufficient documentation

## 2011-02-28 DIAGNOSIS — R1084 Generalized abdominal pain: Secondary | ICD-10-CM

## 2011-02-28 DIAGNOSIS — R1033 Periumbilical pain: Secondary | ICD-10-CM | POA: Insufficient documentation

## 2011-02-28 DIAGNOSIS — IMO0001 Reserved for inherently not codable concepts without codable children: Secondary | ICD-10-CM | POA: Insufficient documentation

## 2011-02-28 DIAGNOSIS — F17200 Nicotine dependence, unspecified, uncomplicated: Secondary | ICD-10-CM | POA: Insufficient documentation

## 2011-02-28 DIAGNOSIS — J45909 Unspecified asthma, uncomplicated: Secondary | ICD-10-CM | POA: Insufficient documentation

## 2011-02-28 DIAGNOSIS — F319 Bipolar disorder, unspecified: Secondary | ICD-10-CM | POA: Insufficient documentation

## 2011-02-28 DIAGNOSIS — R5383 Other fatigue: Secondary | ICD-10-CM | POA: Insufficient documentation

## 2011-02-28 LAB — DIFFERENTIAL
Basophils Absolute: 0 10*3/uL (ref 0.0–0.1)
Basophils Relative: 0 % (ref 0–1)
Eosinophils Absolute: 0.3 10*3/uL (ref 0.0–0.7)
Monocytes Absolute: 0.7 10*3/uL (ref 0.1–1.0)
Neutro Abs: 4.7 10*3/uL (ref 1.7–7.7)

## 2011-02-28 LAB — CBC
HCT: 42.6 % (ref 36.0–46.0)
MCH: 29.3 pg (ref 26.0–34.0)
MCHC: 33.8 g/dL (ref 30.0–36.0)
RDW: 13 % (ref 11.5–15.5)

## 2011-02-28 LAB — URINALYSIS, ROUTINE W REFLEX MICROSCOPIC
Bilirubin Urine: NEGATIVE
Glucose, UA: NEGATIVE mg/dL
Ketones, ur: NEGATIVE mg/dL
Leukocytes, UA: NEGATIVE
Nitrite: NEGATIVE
Protein, ur: NEGATIVE mg/dL

## 2011-02-28 LAB — BASIC METABOLIC PANEL
BUN: 12 mg/dL (ref 6–23)
Calcium: 9.9 mg/dL (ref 8.4–10.5)
Chloride: 101 mEq/L (ref 96–112)
Creatinine, Ser: 0.58 mg/dL (ref 0.50–1.10)
GFR calc Af Amer: >60 mL/min (ref >60)
GFR calc non Af Amer: >60 mL/min (ref >60)

## 2011-02-28 MED ORDER — ONDANSETRON HCL 4 MG/2ML IJ SOLN
4.0000 mg | Freq: Once | INTRAMUSCULAR | Status: AC
  Administered 2011-02-28: 4 mg via INTRAVENOUS
  Filled 2011-02-28: qty 2

## 2011-02-28 MED ORDER — ONDANSETRON 8 MG PO TBDP: 8.0000 mg | ORAL_TABLET | Freq: Three times a day (TID) | ORAL | Status: AC | PRN

## 2011-02-28 MED ORDER — MORPHINE SULFATE 4 MG/ML IJ SOLN
4.0000 mg | Freq: Once | INTRAMUSCULAR | Status: AC
  Administered 2011-02-28: 4 mg via INTRAVENOUS
  Filled 2011-02-28: qty 1

## 2011-02-28 NOTE — ED Notes (Signed)
Pt c/o dizziness x 3 days. States that she feels like she is going to faint. C/o pain in center of her abdomen. Had appendectomy last Wednesday. States that she got Meclizine 25 mg called in yesterday but it has not helped. Alert and oriented x 3. Skin warm and dry. Color pink. Ambulates independently with no problems. States that her oxycodone is not helping the pain.

## 2011-02-28 NOTE — ED Provider Notes (Signed)
History     CSN: [id number] Arrival date & time: 02/28/2011  7:01 PM  Chief Complaint  Patient presents with  . Dizziness   Patient is a 40 y.o. female presenting with abdominal pain. The history is provided by the patient.  Abdominal Pain The primary symptoms of the illness include abdominal pain and fatigue. The primary symptoms of the illness do not include fever, shortness of breath, nausea, vomiting, diarrhea, hematemesis, hematochezia, dysuria or vaginal discharge. Primary symptoms comment: She describes intermittent lightheadedness for the past week,  usually  starting after she has been standing a few minutes. The current episode started 2 days ago. The onset of the illness was gradual. The problem has not changed since onset. Associated with: Patient is 10 days out from a exploratory abdominal lap surgery due to chronic abdominal pain.  She had a non infected appendix removed during this surgery. The patient states that she believes she is currently not pregnant. The patient has not had a change in bowel habit. Symptoms associated with the illness do not include chills, anorexia, diaphoresis, constipation, hematuria or back pain. Associated symptoms comments: She denies palpitation,  Denies headache and weakness.  She was prescribed meclizine by her pcp which has not helped. .     Past Medical History  Diagnosis Date  . Asthma   . Bipolar 1 disorder   . Fibromyalgia   . Kidney stones   . Diverticulosis   . Arthritis   . S/P colonoscopy     Dr. Karilyn Cota, 2010? diverticulosis  . Dysrhythmia     sts "I have heart palpitations"  . Shortness of breath     Past Surgical History  Procedure Date  . Tonsillectomy and adenoidectomy   . Umbilical hernia repair Dec 2011    Dr. Lovell Sheehan  . Abdominal hysterectomy   . Hemorrhoid surgery   . Cholecystectomy   . Foot surgery   . Left ovarian removal   . Cesarean section   . Right ovarian removal   . Exploratory laparoscopy   . Wisdom  teeth removal   . Multiple hernia repairs   . Tubes in ears   . Tonsillectomy   . Laparoscopy 02/19/2011    Procedure: LAPAROSCOPY DIAGNOSTIC;  Surgeon: Dalia Heading;  Location: [facility];  Service: General;  Laterality: N/A;  . Laparoscopic appendectomy 02/19/2011    Procedure: APPENDECTOMY LAPAROSCOPIC;  Surgeon: Dalia Heading;  Location: [facility];  Service: General;  Laterality: N/A;  . Appendectomy     Family History  Problem Relation Age of Onset  . Thyroid disease Mother   . Colon cancer      paternal and maternal grandfather  . Anesthesia problems Neg Hx   . Hypotension Neg Hx   . Malignant hyperthermia Neg Hx   . Pseudochol deficiency Neg Hx     History  Substance Use Topics  . Smoking status: Current Everyday Smoker -- 0.5 packs/day for 20 years    Types: Cigarettes  . Smokeless tobacco: Not on file  . Alcohol Use: Yes     occasional    OB History    Grav Para Term Preterm Abortions TAB SAB Ect Mult Living                  Review of Systems  Constitutional: Positive for fatigue. Negative for fever, chills and diaphoresis.  HENT: Negative for congestion, sore throat and neck pain.   Eyes: Negative.   Respiratory: Negative for chest tightness and shortness of  breath.   Cardiovascular: Negative for chest pain.  Gastrointestinal: Positive for abdominal pain. Negative for nausea, vomiting, diarrhea, constipation, hematochezia, anorexia and hematemesis.  Genitourinary: Negative.  Negative for dysuria, hematuria and vaginal discharge.  Musculoskeletal: Negative for back pain, joint swelling and arthralgias.  Skin: Negative.  Negative for rash and wound.  Neurological: Negative for dizziness, weakness, light-headedness, numbness and headaches.  Hematological: Negative.   Psychiatric/Behavioral: Negative.     Physical Exam  BP 136/86  Pulse 85  Temp(Src) 98 F (36.7 C) (Oral)  Resp 18  Ht 5\' 6"  (1.676 m)  Wt 218 lb (98.884 kg)  BMI 35.19 kg/m2  SpO2  98%  Physical Exam  Nursing note and vitals reviewed. Constitutional: She is oriented to person, place, and time. She appears well-developed and well-nourished. No distress.  HENT:  Head: Normocephalic and atraumatic.  Eyes: Conjunctivae are normal.  Neck: Normal range of motion. Neck supple.  Cardiovascular: Normal rate, regular rhythm, normal heart sounds and intact distal pulses.   Pulmonary/Chest: Effort normal and breath sounds normal. She has no wheezes. She has no rales.  Abdominal: Soft. Bowel sounds are normal. She exhibits no mass. There is no hepatosplenomegaly. There is tenderness in the periumbilical area. There is no rigidity, no rebound, no guarding and no CVA tenderness.       Tenderness is mild,  Non acute abdomen,  No increased tympany.  Normal to percussion.  Well healed laparoscopic incisions.  Musculoskeletal: Normal range of motion.  Neurological: She is alert and oriented to person, place, and time.  Skin: Skin is warm and dry.  Psychiatric: She has a normal mood and affect.    ED Course  Procedures  MDM Normal labs,  Xray showing no intraabdominal free air,  Normal bowel gas pattern.  Patient with concentrated urine - probable dehydrated  - encouraged to rest,  Drink more fluids.  Encouraged to see her doctor for f/u,  But return here sooner if sx worsen,  If she develops emesis or fever.      Candis Musa, PA 02/28/11 2346  Hilario Quarry, MD 03/01/11 1344

## 2011-03-08 ENCOUNTER — Encounter (HOSPITAL_COMMUNITY): Payer: Self-pay | Admitting: *Deleted

## 2011-03-08 ENCOUNTER — Emergency Department (HOSPITAL_COMMUNITY)
Admission: EM | Admit: 2011-03-08 | Discharge: 2011-03-08 | Disposition: A | Discharge status: Home / Self Care | Payer: Medicaid Other | Attending: Emergency Medicine | Admitting: Emergency Medicine

## 2011-03-08 ENCOUNTER — Emergency Department (HOSPITAL_COMMUNITY): Payer: Medicaid Other

## 2011-03-08 DIAGNOSIS — J45909 Unspecified asthma, uncomplicated: Secondary | ICD-10-CM | POA: Insufficient documentation

## 2011-03-08 DIAGNOSIS — IMO0001 Reserved for inherently not codable concepts without codable children: Secondary | ICD-10-CM | POA: Insufficient documentation

## 2011-03-08 DIAGNOSIS — S301XXA Contusion of abdominal wall, initial encounter: Secondary | ICD-10-CM | POA: Insufficient documentation

## 2011-03-08 DIAGNOSIS — K5730 Diverticulosis of large intestine without perforation or abscess without bleeding: Secondary | ICD-10-CM | POA: Insufficient documentation

## 2011-03-08 DIAGNOSIS — Z87442 Personal history of urinary calculi: Secondary | ICD-10-CM | POA: Insufficient documentation

## 2011-03-08 DIAGNOSIS — Y92009 Unspecified place in unspecified non-institutional (private) residence as the place of occurrence of the external cause: Secondary | ICD-10-CM | POA: Insufficient documentation

## 2011-03-08 DIAGNOSIS — W19XXXA Unspecified fall, initial encounter: Secondary | ICD-10-CM | POA: Insufficient documentation

## 2011-03-08 DIAGNOSIS — F17200 Nicotine dependence, unspecified, uncomplicated: Secondary | ICD-10-CM | POA: Insufficient documentation

## 2011-03-08 DIAGNOSIS — F319 Bipolar disorder, unspecified: Secondary | ICD-10-CM | POA: Insufficient documentation

## 2011-03-08 LAB — CBC
HCT: 39.4 % (ref 36.0–46.0)
Hemoglobin: 13.4 g/dL (ref 12.0–15.0)
MCV: 86.8 fL (ref 78.0–100.0)
RBC: 4.54 MIL/uL (ref 3.87–5.11)
WBC: 9.6 10*3/uL (ref 4.0–10.5)

## 2011-03-08 LAB — URINALYSIS, ROUTINE W REFLEX MICROSCOPIC
Bilirubin Urine: NEGATIVE
Glucose, UA: NEGATIVE mg/dL
Nitrite: NEGATIVE
Specific Gravity, Urine: >1.03 — ABNORMAL HIGH (ref 1.005–1.030)
pH: 6 (ref 5.0–8.0)

## 2011-03-08 LAB — DIFFERENTIAL
Basophils Absolute: 0.1 10*3/uL (ref 0.0–0.1)
Lymphocytes Relative: 43 % (ref 12–46)
Lymphs Abs: 4.1 10*3/uL — ABNORMAL HIGH (ref 0.7–4.0)
Monocytes Absolute: 0.7 10*3/uL (ref 0.1–1.0)
Monocytes Relative: 7 % (ref 3–12)
Neutro Abs: 4.4 10*3/uL (ref 1.7–7.7)

## 2011-03-08 LAB — POCT I-STAT, CHEM 8
Chloride: 105 mEq/L (ref 96–112)
Creatinine, Ser: 0.7 mg/dL (ref 0.50–1.10)
HCT: 41 % (ref 36.0–46.0)
Hemoglobin: 13.9 g/dL (ref 12.0–15.0)
Potassium: 3.9 mEq/L (ref 3.5–5.1)
Sodium: 141 mEq/L (ref 135–145)

## 2011-03-08 MED ORDER — IBUPROFEN 800 MG PO TABS: 800.0000 mg | ORAL_TABLET | Freq: Three times a day (TID) | ORAL | Status: AC

## 2011-03-08 MED ORDER — IOHEXOL 300 MG/ML  SOLN
100.0000 mL | Freq: Once | INTRAMUSCULAR | Status: AC | PRN
  Administered 2011-03-08: 100 mL via INTRAVENOUS

## 2011-03-08 MED ORDER — MORPHINE SULFATE 4 MG/ML IJ SOLN
4.0000 mg | Freq: Once | INTRAMUSCULAR | Status: AC
  Administered 2011-03-08: 4 mg via INTRAVENOUS
  Filled 2011-03-08: qty 1

## 2011-03-08 NOTE — ED Notes (Addendum)
Patient passed out today at 1800, hx of dizzy spells since 2 days after appendectomy done on 02/19/11, states she landed on her abdomen and c/o throbbing pain to umbilical area of abdomen since fall today, pt admits to taking med Antivert for the dizziness; appendectomy done by Dr. Lovell Sheehan

## 2011-03-08 NOTE — ED Notes (Signed)
Pt self ambulated out with a steady gait stating no needs 

## 2011-03-08 NOTE — ED Notes (Signed)
Pt states she fainted today and landed on her abdomen. Pt c/o abdominal pain where she recently had an appendectomy.

## 2011-03-08 NOTE — ED Provider Notes (Signed)
History  Scribed for Dr. Hyacinth Meeker, the patient was seen in room 11. The chart was scribed by Gilman Schmidt. The patients care was started at 2155.  CSN: [id number] Arrival date & time: 03/08/2011  9:43 PM  Chief Complaint  Patient presents with  . Fall  . Abdominal Pain   The history is provided by the patient and medical records.   Allison Bolton is a 40 y.o. female who presents to the Emergency Department complaining of abdominal pain. Patient reports that she became dizzy and fell to the ground ~18:00 and landed on her abdomen where she recently had an appendectomy. Patient reports that she was in the kitchen carrying groceries from outside when she fell on the floor and slowly got up (scraping her knees). States she lost her balance. She describes having dizzy spells and fainting spells for the last two weeks. She reports that the dizzy spells begin by seeing spots (while standing) and the room spinning. Dizziness is exacerbated when moving head from side to side and is alleviated by staying still. Patient reports associated symptoms of diarrhea and cough, but denies any ringing in ears, nausea, vomiting, fever, or painful urination. Additionally patient reports that she does have low iron level and denied taking any new medications. She has been evaluated by her family doctor for these symptoms and has not had any definitive results. She denies losing consciousness but gets dizzy with a feeling of vertigo.  HPI ELEMENTS:  Location: abdomen Onset: two weeks ago Timing: intermittent    Modifying factors: dizziness is exacerbated when moving head from side to side and is alleviated by staying still Context: as above  Associated symptoms: diarrhea and cough, but denies any ringing in ears, nausea, vomiting, fever, or painful urination.   PAST MEDICAL HISTORY:  Past Medical History  Diagnosis Date  . Asthma   . Bipolar 1 disorder   . Fibromyalgia   . Kidney stones   . Diverticulosis   .  Arthritis   . S/P colonoscopy     Dr. Karilyn Cota, 2010? diverticulosis  . Dysrhythmia     sts "I have heart palpitations"  . Shortness of breath      PAST SURGICAL HISTORY:  Past Surgical History  Procedure Date  . Tonsillectomy and adenoidectomy   . Umbilical hernia repair Dec 2011    Dr. Lovell Sheehan  . Abdominal hysterectomy   . Hemorrhoid surgery   . Cholecystectomy   . Foot surgery   . Left ovarian removal   . Cesarean section   . Right ovarian removal   . Exploratory laparoscopy   . Wisdom teeth removal   . Multiple hernia repairs   . Tubes in ears   . Tonsillectomy   . Laparoscopy 02/19/2011    Procedure: LAPAROSCOPY DIAGNOSTIC;  Surgeon: Dalia Heading;  Location: [facility];  Service: General;  Laterality: N/A;  . Laparoscopic appendectomy 02/19/2011    Procedure: APPENDECTOMY LAPAROSCOPIC;  Surgeon: Dalia Heading;  Location: [facility];  Service: General;  Laterality: N/A;  . Appendectomy      MEDICATIONS:  Previous Medications   ALBUTEROL (PROVENTIL) (2.5 MG/3ML) 0.083% NEBULIZER SOLUTION    Take 2.5 mg by nebulization every 6 (six) hours as needed.     ALBUTEROL (PROVENTIL) 90 MCG/ACT INHALER    Inhale 2 puffs into the lungs every 6 (six) hours as needed.     CLONAZEPAM (KLONOPIN) 1 MG TABLET    Take 1 mg by mouth 2 (two) times daily.  CYCLOBENZAPRINE (FLEXERIL) 10 MG TABLET    Take 10 mg by mouth 2 (two) times daily as needed. For muscle spasms    CYCLOBENZAPRINE HCL PO    Take 10 mg by mouth 2 (two) times daily.    DIAZEPAM (VALIUM) 10 MG TABLET    Take 10 mg by mouth 4 (four) times daily - after meals and at bedtime. Takes three times daily as needed and every night   DULOXETINE (CYMBALTA) 30 MG CAPSULE    Take 30 mg by mouth 2 (two) times daily.     ESTROGENS, CONJUGATED, (PREMARIN) 0.625 MG TABLET    Take 0.625 mg by mouth daily.    GABAPENTIN (NEURONTIN) 600 MG TABLET    Take 600 mg by mouth 4 (four) times daily.     HYDROCODONE-ACETAMINOPHEN (NORCO) 5-325 MG PER TABLET     1 - 2 tablets po q4hrs prn pain   MUPIROCIN (BACTROBAN) 2 % NASAL OINTMENT    Place into the nose 2 (two) times daily. Use one-half of tube in each nostril twice daily for five (5) days. After application, press sides of nose together and gently massage.    OMEPRAZOLE (PRILOSEC) 20 MG CAPSULE    Take 20 mg by mouth daily.     OXYCODONE-ACETAMINOPHEN (PERCOCET) 7.5-325 MG PER TABLET    Take 1 tablet by mouth every 4 (four) hours as needed. For pain      ALLERGIES:  Allergies as of 03/08/2011 - Review Complete 03/08/2011  Allergen Reaction Noted  . Amoxicillin Other (See Comments)      FAMILY HISTORY:  Family History  Problem Relation Age of Onset  . Thyroid disease Mother   . Colon cancer      paternal and maternal grandfather  . Anesthesia problems Neg Hx   . Hypotension Neg Hx   . Malignant hyperthermia Neg Hx   . Pseudochol deficiency Neg Hx      SOCIAL HISTORY: History  Substance Use Topics  . Smoking status: Current Everyday Smoker -- 0.5 packs/day for 20 years    Types: Cigarettes  . Smokeless tobacco: Not on file  . Alcohol Use: Yes     occasional      Review of Systems  Constitutional: Negative for fever.  HENT:       Denies any ringing in ears  Respiratory: Positive for cough.   Gastrointestinal: Positive for abdominal pain and diarrhea. Negative for nausea and vomiting.  Genitourinary: Negative for dysuria.  Skin: Negative for rash.  Neurological: Positive for dizziness and syncope.       Loss of balance  All other systems reviewed and are negative.    Physical Exam  BP 107/67  Pulse 107  Temp(Src) 98.3 F (36.8 C) (Oral)  Resp 20  Ht 5\' 6"  (1.676 m)  Wt 218 lb (98.884 kg)  BMI 35.19 kg/m2  SpO2 97%  Physical Exam  Constitutional: She is oriented to person, place, and time. She appears well-developed and well-nourished.  HENT:  Head: Normocephalic and atraumatic.  Right Ear: Hearing, tympanic membrane, external ear and ear canal normal.    Left Ear: Hearing, tympanic membrane, external ear and ear canal normal.  Eyes: Conjunctivae and EOM are normal. Pupils are equal, round, and reactive to light.  Neck: Normal range of motion and phonation normal. Neck supple.  Cardiovascular: Normal rate, regular rhythm, normal heart sounds and intact distal pulses.   Pulmonary/Chest: Effort normal and breath sounds normal. She exhibits no bony tenderness.  Abdominal: Soft. Normal  appearance and bowel sounds are normal. There is tenderness in the periumbilical area and suprapubic area.       Positive for Carnet's Sign Non Peritoneal  Musculoskeletal: Normal range of motion. She exhibits no edema.  Neurological: She is alert and oriented to person, place, and time. She has normal strength. No cranial nerve deficit or sensory deficit. She exhibits normal muscle tone. Coordination normal.  Skin: Skin is warm, dry and intact.  Psychiatric: She has a normal mood and affect. Her behavior is normal. Judgment and thought content normal.   OTHER DATA REVIEWED: Nursing notes, vital signs, and past medical records reviewed.   DIAGNOSTIC STUDIES: Oxygen Saturation is 97% on room air, normal by my interpretation.    LABS  Results for orders placed during the hospital encounter of 03/08/11  CBC      Component Value Range   WBC 9.6  4.0 - 10.5 (K/uL)   RBC 4.54  3.87 - 5.11 (MIL/uL)   Hemoglobin 13.4  12.0 - 15.0 (g/dL)   HCT 16.1  09.6 - 04.5 (%)   MCV 86.8  78.0 - 100.0 (fL)   MCH 29.5  26.0 - 34.0 (pg)   MCHC 34.0  30.0 - 36.0 (g/dL)   RDW 40.9  81.1 - 91.4 (%)   Platelets 252  150 - 400 (K/uL)  DIFFERENTIAL      Component Value Range   Neutrophils Relative 46  43 - 77 (%)   Neutro Abs 4.4  1.7 - 7.7 (K/uL)   Lymphocytes Relative 43  12 - 46 (%)   Lymphs Abs 4.1 (*) 0.7 - 4.0 (K/uL)   Monocytes Relative 7  3 - 12 (%)   Monocytes Absolute 0.7  0.1 - 1.0 (K/uL)   Eosinophils Relative 4  0 - 5 (%)   Eosinophils Absolute 0.4  0.0 - 0.7  (K/uL)   Basophils Relative 1  0 - 1 (%)   Basophils Absolute 0.1  0.0 - 0.1 (K/uL)  URINALYSIS, ROUTINE W REFLEX MICROSCOPIC      Component Value Range   Color, Urine YELLOW  YELLOW    Appearance HAZY (*) CLEAR    Specific Gravity, Urine >1.030 (*) 1.005 - 1.030    pH 6.0  5.0 - 8.0    Glucose, UA NEGATIVE  NEGATIVE (mg/dL)   Hgb urine dipstick NEGATIVE  NEGATIVE    Bilirubin Urine NEGATIVE  NEGATIVE    Ketones, ur NEGATIVE  NEGATIVE (mg/dL)   Protein, ur NEGATIVE  NEGATIVE (mg/dL)   Urobilinogen, UA 0.2  0.0 - 1.0 (mg/dL)   Nitrite NEGATIVE  NEGATIVE    Leukocytes, UA NEGATIVE  NEGATIVE   PREGNANCY, URINE      Component Value Range   Preg Test, Ur NEGATIVE    POCT I-STAT, CHEM 8      Component Value Range   Sodium 141  135 - 145 (mEq/L)   Potassium 3.9  3.5 - 5.1 (mEq/L)   Chloride 105  96 - 112 (mEq/L)   BUN 12  6 - 23 (mg/dL)   Creatinine, Ser 7.82  0.50 - 1.10 (mg/dL)   Glucose, Bld 956 (*) 70 - 99 (mg/dL)   Calcium, Ion 2.13  0.86 - 1.32 (mmol/L)   TCO2 24  0 - 100 (mmol/L)   Hemoglobin 13.9  12.0 - 15.0 (g/dL)   HCT 57.8  46.9 - 62.9 (%)   RADIOLOGY:  CT Abdomen Pelvis  Neg for acute findings   ED COURSE / COORDINATION OF CARE:  2155: Patient evaluated by ED physician, morphine, CT Abdomen, UA, Pregnancy Test, Chem 8 ordered   MDM:  No acute findings on CT - no intraabdominal injury  IMPRESSION: Diagnoses that have been ruled out:  Diagnoses that are still under consideration:  Final diagnoses:  Contusion of abdominal wall    PLAN:  Home Nonsteroidals The patient is to return the emergency department if there is any worsening of symptoms. I have reviewed the discharge instructions with the pt  CONDITION ON DISCHARGE: Good  MEDICATIONS GIVEN IN THE E.D.  Medications  ibuprofen (ADVIL,MOTRIN) 800 MG tablet (not administered)  morphine injection 4 mg (4 mg Intravenous Given 03/08/11 2241)  iohexol (OMNIPAQUE) 300 MG/ML injection 100 mL (100 mL  Intravenous Contrast Given 03/08/11 2301)    DISCHARGE MEDICATIONS: New Prescriptions   IBUPROFEN (ADVIL,MOTRIN) 800 MG TABLET    Take 1 tablet (800 mg total) by mouth 3 (three) times daily.    SCRIBE ATTESTATION: I personally performed the services described in this documentation, which was scribed in my presence. The recorded information has been reviewed and considered. Vida Roller, MD    Procedures       Vida Roller, MD 03/08/11 [phone]

## 2011-03-17 ENCOUNTER — Encounter: Payer: Self-pay | Admitting: Gastroenterology

## 2011-03-27 ENCOUNTER — Encounter (HOSPITAL_COMMUNITY): Payer: Self-pay | Admitting: *Deleted

## 2011-03-27 ENCOUNTER — Emergency Department (HOSPITAL_COMMUNITY): Payer: Medicaid Other

## 2011-03-27 ENCOUNTER — Emergency Department (HOSPITAL_COMMUNITY)
Admission: EM | Admit: 2011-03-27 | Discharge: 2011-03-27 | Disposition: A | Discharge status: Home / Self Care | Payer: Medicaid Other | Attending: Emergency Medicine | Admitting: Emergency Medicine

## 2011-03-27 ENCOUNTER — Other Ambulatory Visit: Payer: Self-pay

## 2011-03-27 DIAGNOSIS — H9319 Tinnitus, unspecified ear: Secondary | ICD-10-CM | POA: Insufficient documentation

## 2011-03-27 DIAGNOSIS — R42 Dizziness and giddiness: Secondary | ICD-10-CM | POA: Insufficient documentation

## 2011-03-27 DIAGNOSIS — IMO0001 Reserved for inherently not codable concepts without codable children: Secondary | ICD-10-CM | POA: Insufficient documentation

## 2011-03-27 DIAGNOSIS — R55 Syncope and collapse: Secondary | ICD-10-CM | POA: Insufficient documentation

## 2011-03-27 DIAGNOSIS — J45909 Unspecified asthma, uncomplicated: Secondary | ICD-10-CM | POA: Insufficient documentation

## 2011-03-27 DIAGNOSIS — F17200 Nicotine dependence, unspecified, uncomplicated: Secondary | ICD-10-CM | POA: Insufficient documentation

## 2011-03-27 DIAGNOSIS — F319 Bipolar disorder, unspecified: Secondary | ICD-10-CM | POA: Insufficient documentation

## 2011-03-27 DIAGNOSIS — R51 Headache: Secondary | ICD-10-CM | POA: Insufficient documentation

## 2011-03-27 DIAGNOSIS — Z87442 Personal history of urinary calculi: Secondary | ICD-10-CM | POA: Insufficient documentation

## 2011-03-27 DIAGNOSIS — W1789XA Other fall from one level to another, initial encounter: Secondary | ICD-10-CM | POA: Insufficient documentation

## 2011-03-27 LAB — DIFFERENTIAL
Lymphs Abs: 3.2 10*3/uL (ref 0.7–4.0)
Monocytes Relative: 7 % (ref 3–12)
Neutro Abs: 4.5 10*3/uL (ref 1.7–7.7)
Neutrophils Relative %: 53 % (ref 43–77)

## 2011-03-27 LAB — POCT I-STAT TROPONIN I

## 2011-03-27 LAB — URINALYSIS, ROUTINE W REFLEX MICROSCOPIC
Bilirubin Urine: NEGATIVE
Glucose, UA: NEGATIVE mg/dL
Hgb urine dipstick: NEGATIVE
Specific Gravity, Urine: >1.03 — ABNORMAL HIGH (ref 1.005–1.030)
pH: 6 (ref 5.0–8.0)

## 2011-03-27 LAB — RAPID URINE DRUG SCREEN, HOSP PERFORMED
Amphetamines: NOT DETECTED
Cocaine: NOT DETECTED
Opiates: NOT DETECTED
Tetrahydrocannabinol: NOT DETECTED

## 2011-03-27 LAB — BASIC METABOLIC PANEL
BUN: 11 mg/dL (ref 6–23)
Chloride: 101 mEq/L (ref 96–112)
Glucose, Bld: 103 mg/dL — ABNORMAL HIGH (ref 70–99)
Potassium: 3.5 mEq/L (ref 3.5–5.1)

## 2011-03-27 LAB — CBC
HCT: 39.6 % (ref 36.0–46.0)
Hemoglobin: 13.5 g/dL (ref 12.0–15.0)
MCH: 29.4 pg (ref 26.0–34.0)
RBC: 4.59 MIL/uL (ref 3.87–5.11)

## 2011-03-27 MED ORDER — MECLIZINE HCL 25 MG PO TABS: 25.0000 mg | ORAL_TABLET | Freq: Three times a day (TID) | ORAL | Status: AC | PRN

## 2011-03-27 NOTE — ED Notes (Signed)
Pt brought in by [facility] for c/o near syncopal episode; pt states she did not have any loc but did fall to the floor; pt c/o head and neck pain

## 2011-03-27 NOTE — ED Provider Notes (Addendum)
History     CSN: [id number] Arrival date & time: 03/27/2011  7:11 PM  Chief Complaint  Patient presents with  . Near Syncope/LSB     HPI  (Consider location/radiation/quality/duration/timing/severity/associated sxs/prior treatment)  HPI PT reports she passed out landing on her back just prior to arrival. STates she is unsure if she was totally unconscious, but fell from standing and hit her head. Complaining of moderate to severe aching headache and neck pain. Recently seen in the ED for similar and diagnosed with vertigo. States she ran out of Meclizine. She  Past Medical History  Diagnosis Date  . Asthma   . Bipolar 1 disorder   . Fibromyalgia   . Kidney stones   . Diverticulosis   . Arthritis   . S/P colonoscopy     Dr. Karilyn Cota 2010: few small diverticula at sigmoid. otherwise normal.   . Dysrhythmia     sts "I have heart palpitations"  . Shortness of breath     Past Surgical History  Procedure Date  . Tonsillectomy and adenoidectomy   . Umbilical hernia repair Dec 2011    Dr. Lovell Sheehan  . Abdominal hysterectomy   . Hemorrhoid surgery   . Cholecystectomy   . Foot surgery   . Left ovarian removal   . Cesarean section   . Right ovarian removal   . Exploratory laparoscopy   . Wisdom teeth removal   . Multiple hernia repairs   . Tubes in ears   . Tonsillectomy   . Laparoscopy 02/19/2011    Procedure: LAPAROSCOPY DIAGNOSTIC;  Surgeon: Dalia Heading;  Location: [facility];  Service: General;  Laterality: N/A;  . Laparoscopic appendectomy 02/19/2011    Procedure: APPENDECTOMY LAPAROSCOPIC;  Surgeon: Dalia Heading;  Location: [facility];  Service: General;  Laterality: N/A;  . Appendectomy     Family History  Problem Relation Age of Onset  . Thyroid disease Mother   . Colon cancer      paternal and maternal grandfather  . Anesthesia problems Neg Hx   . Hypotension Neg Hx   . Malignant hyperthermia Neg Hx   . Pseudochol deficiency Neg Hx     History  Substance Use  Topics  . Smoking status: Current Everyday Smoker -- 0.5 packs/day for 20 years    Types: Cigarettes  . Smokeless tobacco: Not on file  . Alcohol Use: Yes     occasional    OB History    Grav Para Term Preterm Abortions TAB SAB Ect Mult Living                  Review of Systems  Review of Systems All other systems reviewed and are negative except as noted in HPI.   Allergies  Amoxicillin  Home Medications   Current Outpatient Rx  Name Route Sig Dispense Refill  . CYCLOBENZAPRINE HCL 10 MG PO TABS Oral Take 10 mg by mouth 2 (two) times daily as needed. For muscle spasms     . DIAZEPAM 10 MG PO TABS Oral Take 10 mg by mouth 4 (four) times daily - after meals and at bedtime. Takes three times daily as needed and every night    . DULOXETINE HCL 30 MG PO CPEP Oral Take 30 mg by mouth 2 (two) times daily.      Marland Kitchen ESTROGENS CONJUGATED 0.625 MG PO TABS Oral Take 0.625 mg by mouth daily.     Marland Kitchen GABAPENTIN 600 MG PO TABS Oral Take 600 mg by mouth 4 (  four) times daily.      Marland Kitchen MECLIZINE HCL 25 MG PO TABS Oral Take 25 mg by mouth 3 (three) times daily as needed. For vertigo     . OMEPRAZOLE 20 MG PO CPDR Oral Take 20 mg by mouth daily.      . ALBUTEROL SULFATE (2.5 MG/3ML) 0.083% IN NEBU Nebulization Take 2.5 mg by nebulization every 6 (six) hours as needed.      . ALBUTEROL 90 MCG/ACT IN AERS Inhalation Inhale 2 puffs into the lungs every 6 (six) hours as needed. For shortness of breath    . CLONAZEPAM 1 MG PO TABS Oral Take 1 mg by mouth 2 (two) times daily.      . CYCLOBENZAPRINE HCL PO Oral Take 10 mg by mouth 2 (two) times daily.     Marland Kitchen HYDROCODONE-ACETAMINOPHEN 5-325 MG PO TABS  1 - 2 tablets po q4hrs prn pain 50 tablet 0  . MUPIROCIN CALCIUM 2 % NA OINT Nasal Place into the nose 2 (two) times daily. Use one-half of tube in each nostril twice daily for five (5) days. After application, press sides of nose together and gently massage.     . OXYCODONE-ACETAMINOPHEN 7.5-325 MG PO TABS Oral  Take 1 tablet by mouth every 4 (four) hours as needed. For pain       Physical Exam    BP 120/82  Pulse 96  Temp(Src) 97.6 F (36.4 C) (Oral)  Resp 16  Ht 5\' 6"  (1.676 m)  Wt 200 lb (90.719 kg)  BMI 32.28 kg/m2  SpO2 97%  Physical Exam  Nursing note and vitals reviewed. Constitutional: She is oriented to person, place, and time. She appears well-developed and well-nourished.  HENT:  Head: Normocephalic and atraumatic.  Right Ear: Tympanic membrane normal.  Left Ear: Tympanic membrane normal.  Eyes: EOM are normal. Pupils are equal, round, and reactive to light.  Neck:       Immobilized in C-collar, tender on midline c-cpine  Cardiovascular: Normal rate, normal heart sounds and intact distal pulses.   Pulmonary/Chest: Effort normal and breath sounds normal.  Abdominal: Bowel sounds are normal. She exhibits no distension. There is no tenderness.  Musculoskeletal: Normal range of motion. She exhibits no edema and no tenderness.       Tender in L-spine on midline  Neurological: She is alert and oriented to person, place, and time. She has normal strength. No cranial nerve deficit or sensory deficit.  Skin: Skin is warm and dry. No rash noted.  Psychiatric:       Slow to respond to questions, but answers appropriately.     ED Course  Procedures (including critical care time)  Results for orders placed during the hospital encounter of 03/27/11  CBC      Component Value Range   WBC 8.6  4.0 - 10.5 (K/uL)   RBC 4.59  3.87 - 5.11 (MIL/uL)   Hemoglobin 13.5  12.0 - 15.0 (g/dL)   HCT 14.7  82.9 - 56.2 (%)   MCV 86.3  78.0 - 100.0 (fL)   MCH 29.4  26.0 - 34.0 (pg)   MCHC 34.1  30.0 - 36.0 (g/dL)   RDW 13.0  86.5 - 78.4 (%)   Platelets 264  150 - 400 (K/uL)  DIFFERENTIAL      Component Value Range   Neutrophils Relative 53  43 - 77 (%)   Neutro Abs 4.5  1.7 - 7.7 (K/uL)   Lymphocytes Relative 37  12 - 46 (%)  Lymphs Abs 3.2  0.7 - 4.0 (K/uL)   Monocytes Relative 7  3 - 12  (%)   Monocytes Absolute 0.6  0.1 - 1.0 (K/uL)   Eosinophils Relative 2  0 - 5 (%)   Eosinophils Absolute 0.2  0.0 - 0.7 (K/uL)   Basophils Relative 1  0 - 1 (%)   Basophils Absolute 0.1  0.0 - 0.1 (K/uL)  BASIC METABOLIC PANEL      Component Value Range   Sodium 137  135 - 145 (mEq/L)   Potassium 3.5  3.5 - 5.1 (mEq/L)   Chloride 101  96 - 112 (mEq/L)   CO2 27  19 - 32 (mEq/L)   Glucose, Bld 103 (*) 70 - 99 (mg/dL)   BUN 11  6 - 23 (mg/dL)   Creatinine, Ser 9.60  0.50 - 1.10 (mg/dL)   Calcium 9.7  8.4 - 45.4 (mg/dL)   GFR calc non Af Amer >60  >60 (mL/min)   GFR calc Af Amer >60  >60 (mL/min)  URINALYSIS, ROUTINE W REFLEX MICROSCOPIC      Component Value Range   Color, Urine YELLOW  YELLOW    Appearance CLEAR  CLEAR    Specific Gravity, Urine >1.030 (*) 1.005 - 1.030    pH 6.0  5.0 - 8.0    Glucose, UA NEGATIVE  NEGATIVE (mg/dL)   Hgb urine dipstick NEGATIVE  NEGATIVE    Bilirubin Urine NEGATIVE  NEGATIVE    Ketones, ur NEGATIVE  NEGATIVE (mg/dL)   Protein, ur NEGATIVE  NEGATIVE (mg/dL)   Urobilinogen, UA 0.2  0.0 - 1.0 (mg/dL)   Nitrite NEGATIVE  NEGATIVE    Leukocytes, UA NEGATIVE  NEGATIVE   ETHANOL      Component Value Range   Alcohol, Ethyl (B) <11  0 - 11 (mg/dL)  URINE RAPID DRUG SCREEN (HOSP PERFORMED)      Component Value Range   Opiates NONE DETECTED  NONE DETECTED    Cocaine NONE DETECTED  NONE DETECTED    Benzodiazepines POSITIVE (*) NONE DETECTED    Amphetamines NONE DETECTED  NONE DETECTED    Tetrahydrocannabinol NONE DETECTED  NONE DETECTED    Barbiturates NONE DETECTED  NONE DETECTED   POCT PREGNANCY, URINE      Component Value Range   Preg Test, Ur NEGATIVE    POCT I-STAT TROPONIN I      Component Value Range   Troponin i, poc 0.00  0.00 - 0.08 (ng/mL)   Comment 3            Dg Lumbar Spine Complete  03/27/2011  *RADIOLOGY REPORT*  Clinical Data: 40 year old female with fall and low back pain.  LUMBAR SPINE - COMPLETE 4+ VIEW  Comparison:  07/29/2010 CT  Findings: Five non-rib bearing lumbar type vertebra are identified in normal alignment. There is no evidence of fracture or subluxation. The disc spaces are maintained. No focal bony lesions or spondylolysis identified.  IMPRESSION: Unremarkable lumbar spine series.  Original Report Authenticated By: Rosendo Gros, M.D.   Ct Head Wo Contrast  03/27/2011  *RADIOLOGY REPORT*  Clinical Data: Vertigo, syncope, head injury.  CT HEAD WITHOUT CONTRAST,CT CERVICAL SPINE WITHOUT CONTRAST  Technique:  Contiguous axial images were obtained from the base of the skull through the vertex without contrast.,Technique: Multidetector CT imaging of the cervical spine was performed. Multiplanar CT image reconstructions were also generated.  Comparison: None.  Findings:  Head:  There is no evidence for acute hemorrhage, hydrocephalus, mass lesion, or  abnormal extra-axial fluid collection.  No definite CT evidence for acute infarction.  The visualized paranasal sinuses and mastoid air cells are predominately clear.  No calvarial abnormality.  Cervical spine: Loss of normal cervical lordosis is likely positional or secondary to muscle spasm.  There is no acute fracture or dislocation.  Maintained vertebral body heights and intervertebral disc spaces.  No pre or paravertebral soft tissue swelling.  The craniocervical relationship is maintained. Mild biapical scarring.  IMPRESSION: No acute intracranial abnormality.  Loss of normal cervical lordosis is likely positional or secondary to muscle spasm.  No acute cervical spine fracture or dislocation.  Original Report Authenticated By: Waneta Martins, M.D.   Ct Cervical Spine Wo Contrast  03/27/2011  *RADIOLOGY REPORT*  Clinical Data: Vertigo, syncope, head injury.  CT HEAD WITHOUT CONTRAST,CT CERVICAL SPINE WITHOUT CONTRAST  Technique:  Contiguous axial images were obtained from the base of the skull through the vertex without contrast.,Technique: Multidetector CT  imaging of the cervical spine was performed. Multiplanar CT image reconstructions were also generated.  Comparison: None.  Findings:  Head:  There is no evidence for acute hemorrhage, hydrocephalus, mass lesion, or abnormal extra-axial fluid collection.  No definite CT evidence for acute infarction.  The visualized paranasal sinuses and mastoid air cells are predominately clear.  No calvarial abnormality.  Cervical spine: Loss of normal cervical lordosis is likely positional or secondary to muscle spasm.  There is no acute fracture or dislocation.  Maintained vertebral body heights and intervertebral disc spaces.  No pre or paravertebral soft tissue swelling.  The craniocervical relationship is maintained. Mild biapical scarring.  IMPRESSION: No acute intracranial abnormality.  Loss of normal cervical lordosis is likely positional or secondary to muscle spasm.  No acute cervical spine fracture or dislocation.  Original Report Authenticated By: Waneta Martins, M.D.      MDM Labs and imaging are unremarkable. Pt cleared from c-collar. Will get her up and moving some. Advised her to continue with her flexeril, diazepam and NSAIDs if needed for aches and pains. Continue Meclizine PRN for vertigo. Referred to ENT for further eval. She does report some ringing in her ears and headache, so could be Meniere's disease.         Charles B. Bernette Mayers, MD 03/27/11 2153   Date: 03/27/2011  Rate: 61  Rhythm: normal sinus rhythm  QRS Axis: normal  Intervals: normal  ST/T Wave abnormalities: normal  Conduction Disutrbances:none  Narrative Interpretation:   Old EKG Reviewed: none available    Charles B. Bernette Mayers, MD 03/27/11 2208

## 2011-04-05 ENCOUNTER — Emergency Department (HOSPITAL_COMMUNITY)
Admission: EM | Admit: 2011-04-05 | Discharge: 2011-04-05 | Disposition: A | Discharge status: Home / Self Care | Payer: Medicaid Other | Attending: Emergency Medicine | Admitting: Emergency Medicine

## 2011-04-05 DIAGNOSIS — R51 Headache: Secondary | ICD-10-CM | POA: Insufficient documentation

## 2011-04-05 DIAGNOSIS — G8929 Other chronic pain: Secondary | ICD-10-CM | POA: Insufficient documentation

## 2011-04-05 DIAGNOSIS — M546 Pain in thoracic spine: Secondary | ICD-10-CM | POA: Insufficient documentation

## 2011-04-05 DIAGNOSIS — Z79899 Other long term (current) drug therapy: Secondary | ICD-10-CM | POA: Insufficient documentation

## 2011-04-05 DIAGNOSIS — M545 Low back pain, unspecified: Secondary | ICD-10-CM | POA: Insufficient documentation

## 2011-04-05 DIAGNOSIS — Y92009 Unspecified place in unspecified non-institutional (private) residence as the place of occurrence of the external cause: Secondary | ICD-10-CM | POA: Insufficient documentation

## 2011-04-05 DIAGNOSIS — M542 Cervicalgia: Secondary | ICD-10-CM | POA: Insufficient documentation

## 2011-04-05 DIAGNOSIS — R42 Dizziness and giddiness: Secondary | ICD-10-CM | POA: Insufficient documentation

## 2011-04-05 DIAGNOSIS — R296 Repeated falls: Secondary | ICD-10-CM | POA: Insufficient documentation

## 2011-04-12 ENCOUNTER — Encounter (HOSPITAL_COMMUNITY): Payer: Self-pay

## 2011-04-12 ENCOUNTER — Emergency Department (HOSPITAL_COMMUNITY)
Admission: EM | Admit: 2011-04-12 | Discharge: 2011-04-12 | Disposition: A | Discharge status: Home / Self Care | Payer: Medicaid Other | Attending: Emergency Medicine | Admitting: Emergency Medicine

## 2011-04-12 DIAGNOSIS — F17200 Nicotine dependence, unspecified, uncomplicated: Secondary | ICD-10-CM | POA: Insufficient documentation

## 2011-04-12 DIAGNOSIS — G43909 Migraine, unspecified, not intractable, without status migrainosus: Secondary | ICD-10-CM | POA: Insufficient documentation

## 2011-04-12 DIAGNOSIS — H9319 Tinnitus, unspecified ear: Secondary | ICD-10-CM | POA: Insufficient documentation

## 2011-04-12 DIAGNOSIS — H8109 Meniere's disease, unspecified ear: Secondary | ICD-10-CM | POA: Insufficient documentation

## 2011-04-12 DIAGNOSIS — R110 Nausea: Secondary | ICD-10-CM | POA: Insufficient documentation

## 2011-04-12 DIAGNOSIS — H53149 Visual discomfort, unspecified: Secondary | ICD-10-CM | POA: Insufficient documentation

## 2011-04-12 DIAGNOSIS — Z79899 Other long term (current) drug therapy: Secondary | ICD-10-CM | POA: Insufficient documentation

## 2011-04-12 MED ORDER — KETOROLAC TROMETHAMINE 30 MG/ML IJ SOLN
30.0000 mg | Freq: Once | INTRAMUSCULAR | Status: AC
  Administered 2011-04-12: 30 mg via INTRAVENOUS
  Filled 2011-04-12: qty 1

## 2011-04-12 MED ORDER — SODIUM CHLORIDE 0.9 % IV BOLUS (SEPSIS)
1000.0000 mL | Freq: Once | INTRAVENOUS | Status: AC
  Administered 2011-04-12: 1000 mL via INTRAVENOUS

## 2011-04-12 MED ORDER — METOCLOPRAMIDE HCL 5 MG/ML IJ SOLN
10.0000 mg | Freq: Once | INTRAMUSCULAR | Status: AC
  Administered 2011-04-12: 10 mg via INTRAVENOUS
  Filled 2011-04-12: qty 2

## 2011-04-12 MED ORDER — DIAZEPAM 5 MG/ML IJ SOLN
2.5000 mg | Freq: Once | INTRAMUSCULAR | Status: AC
  Administered 2011-04-12: 2.5 mg via INTRAVENOUS
  Filled 2011-04-12: qty 2

## 2011-04-12 MED ORDER — DIPHENHYDRAMINE HCL 50 MG/ML IJ SOLN
25.0000 mg | Freq: Once | INTRAMUSCULAR | Status: AC
  Administered 2011-04-12: 25 mg via INTRAVENOUS
  Filled 2011-04-12: qty 1

## 2011-04-12 NOTE — ED Notes (Signed)
Headache, earaches bil, dizzy for 1 day

## 2011-04-12 NOTE — ED Provider Notes (Signed)
History     CSN: [id number] Arrival date & time: 04/12/2011  7:18 PM  No chief complaint on file.   (Consider location/radiation/quality/duration/timing/severity/associated sxs/prior treatment) HPI Comments: The patient also reports a history of Mnire's disease. States she has continued vertigo and tinnitus. Has been taking her medications as prescribed  Patient is a 40 y.o. female presenting with headaches. The history is provided by the patient. No language interpreter was used.  Headache  This is a new problem. The current episode started yesterday. The problem occurs constantly. The problem has been gradually worsening. The headache is associated with bright light and loud noise. The pain is located in the frontal and temporal region. The quality of the pain is described as throbbing. The pain is moderate. The pain does not radiate. Associated symptoms include nausea. Pertinent negatives include no anorexia, no fever, no malaise/fatigue, no near-syncope, no palpitations, no syncope, no shortness of breath and no vomiting. She has tried nothing for the symptoms.    Past Medical History  Diagnosis Date  . Asthma   . Bipolar 1 disorder   . Fibromyalgia   . Kidney stones   . Diverticulosis   . Arthritis   . S/P colonoscopy     Dr. Karilyn Cota 2010: few small diverticula at sigmoid. otherwise normal.   . Dysrhythmia     sts "I have heart palpitations"  . Shortness of breath     Past Surgical History  Procedure Date  . Tonsillectomy and adenoidectomy   . Umbilical hernia repair Dec 2011    Dr. Lovell Sheehan  . Abdominal hysterectomy   . Hemorrhoid surgery   . Cholecystectomy   . Foot surgery   . Left ovarian removal   . Cesarean section   . Right ovarian removal   . Exploratory laparoscopy   . Wisdom teeth removal   . Multiple hernia repairs   . Tubes in ears   . Tonsillectomy   . Laparoscopy 02/19/2011    Procedure: LAPAROSCOPY DIAGNOSTIC;  Surgeon: Dalia Heading;  Location: [facility];  Service: General;  Laterality: N/A;  . Laparoscopic appendectomy 02/19/2011    Procedure: APPENDECTOMY LAPAROSCOPIC;  Surgeon: Dalia Heading;  Location: [facility];  Service: General;  Laterality: N/A;  . Appendectomy     Family History  Problem Relation Age of Onset  . Thyroid disease Mother   . Colon cancer      paternal and maternal grandfather  . Anesthesia problems Neg Hx   . Hypotension Neg Hx   . Malignant hyperthermia Neg Hx   . Pseudochol deficiency Neg Hx     History  Substance Use Topics  . Smoking status: Current Everyday Smoker -- 0.5 packs/day for 20 years    Types: Cigarettes  . Smokeless tobacco: Not on file  . Alcohol Use: Yes     occasional    OB History    Grav Para Term Preterm Abortions TAB SAB Ect Mult Living                  Review of Systems  Constitutional: Negative for fever, malaise/fatigue, activity change and appetite change.  HENT: Positive for tinnitus. Negative for hearing loss, congestion, sore throat, rhinorrhea, neck pain, neck stiffness and ear discharge.   Eyes: Positive for photophobia. Negative for visual disturbance.  Respiratory: Negative for chest tightness and shortness of breath.   Cardiovascular: Negative for chest pain, palpitations, syncope and near-syncope.  Gastrointestinal: Positive for nausea. Negative for vomiting, abdominal pain and anorexia.  Genitourinary: Negative for dysuria, urgency, frequency and flank pain.  Musculoskeletal: Negative for myalgias and back pain.  Neurological: Positive for headaches. Negative for dizziness, weakness, light-headedness and numbness.  All other systems reviewed and are negative.    Allergies  Amoxicillin  Home Medications   Current Outpatient Rx  Name Route Sig Dispense Refill  . ALBUTEROL SULFATE (2.5 MG/3ML) 0.083% IN NEBU Nebulization Take 2.5 mg by nebulization every 6 (six) hours as needed.      . ALBUTEROL 90 MCG/ACT IN AERS Inhalation Inhale 2 puffs into the lungs  every 6 (six) hours as needed. For shortness of breath    . CLONAZEPAM 1 MG PO TABS Oral Take 1 mg by mouth 2 (two) times daily.      . CYCLOBENZAPRINE HCL 10 MG PO TABS Oral Take 10 mg by mouth 2 (two) times daily as needed. For muscle spasms     . CYCLOBENZAPRINE HCL PO Oral Take 10 mg by mouth 2 (two) times daily.     Marland Kitchen DIAZEPAM 10 MG PO TABS Oral Take 10 mg by mouth 4 (four) times daily - after meals and at bedtime. Takes three times daily as needed and every night    . DULOXETINE HCL 30 MG PO CPEP Oral Take 30 mg by mouth 2 (two) times daily.      Marland Kitchen ESTROGENS CONJUGATED 0.625 MG PO TABS Oral Take 0.625 mg by mouth daily.     Marland Kitchen GABAPENTIN 600 MG PO TABS Oral Take 600 mg by mouth 4 (four) times daily.      Marland Kitchen HYDROCODONE-ACETAMINOPHEN 5-325 MG PO TABS  1 - 2 tablets po q4hrs prn pain 50 tablet 0  . MECLIZINE HCL 25 MG PO TABS Oral Take 25 mg by mouth 3 (three) times daily as needed. For vertigo     . MUPIROCIN CALCIUM 2 % NA OINT Nasal Place into the nose 2 (two) times daily. Use one-half of tube in each nostril twice daily for five (5) days. After application, press sides of nose together and gently massage.     Marland Kitchen OMEPRAZOLE 20 MG PO CPDR Oral Take 20 mg by mouth daily.      . OXYCODONE-ACETAMINOPHEN 7.5-325 MG PO TABS Oral Take 1 tablet by mouth every 4 (four) hours as needed. For pain       BP 118/94  Pulse 95  Temp(Src) 98.7 F (37.1 C) (Oral)  Resp 18  Ht 5\' 6"  (1.676 m)  Wt 200 lb (90.719 kg)  BMI 32.28 kg/m2  SpO2 96%  Physical Exam  Nursing note and vitals reviewed. Constitutional: She is oriented to person, place, and time. She appears well-developed and well-nourished. She appears distressed (uncomfortable appearing).  HENT:  Head: Normocephalic and atraumatic.  Right Ear: External ear normal.  Left Ear: External ear normal.  Mouth/Throat: Oropharynx is clear and moist.  Eyes: Conjunctivae and EOM are normal. Pupils are equal, round, and reactive to light.  Neck: Normal  range of motion. Neck supple.  Cardiovascular: Normal rate, regular rhythm, normal heart sounds and intact distal pulses.  Exam reveals no gallop and no friction rub.   No murmur heard. Pulmonary/Chest: Effort normal and breath sounds normal. No respiratory distress.  Abdominal: Soft. There is no tenderness.  Musculoskeletal: Normal range of motion.  Neurological: She is alert and oriented to person, place, and time.  Skin: Skin is warm and dry. No rash noted.    ED Course  Procedures (including critical care time)   Labs Reviewed  URINALYSIS, ROUTINE W REFLEX MICROSCOPIC   No results found.   1. Migraine       MDM  Patient's headache is gradual in onset with associated photophobia. This is consistent with a migraine. Headache was not sudden in onset and has no neurologic symptoms. I'm not concerned about subarachnoid hemorrhage or additional causes of malignant headache. She received a headache cocktail with resolution of her symptoms on reassessment. States her vertigo has resolved. I instructed her to followup with ENT as previously instructed. At this time the patient is safe for discharge to home. She is provided signs and symptoms for which to return to the emergency department        Dayton Bailiff, MD 04/12/11 2045

## 2011-04-21 ENCOUNTER — Emergency Department (HOSPITAL_COMMUNITY)
Admission: EM | Admit: 2011-04-21 | Discharge: 2011-04-21 | Disposition: A | Discharge status: Home / Self Care | Payer: Medicaid Other | Attending: Emergency Medicine | Admitting: Emergency Medicine

## 2011-04-21 ENCOUNTER — Encounter (HOSPITAL_COMMUNITY): Payer: Self-pay | Admitting: *Deleted

## 2011-04-21 DIAGNOSIS — F319 Bipolar disorder, unspecified: Secondary | ICD-10-CM | POA: Insufficient documentation

## 2011-04-21 DIAGNOSIS — R000 Tachycardia, unspecified: Secondary | ICD-10-CM | POA: Insufficient documentation

## 2011-04-21 DIAGNOSIS — J45909 Unspecified asthma, uncomplicated: Secondary | ICD-10-CM | POA: Insufficient documentation

## 2011-04-21 DIAGNOSIS — R51 Headache: Secondary | ICD-10-CM

## 2011-04-21 DIAGNOSIS — Z87442 Personal history of urinary calculi: Secondary | ICD-10-CM | POA: Insufficient documentation

## 2011-04-21 DIAGNOSIS — IMO0001 Reserved for inherently not codable concepts without codable children: Secondary | ICD-10-CM | POA: Insufficient documentation

## 2011-04-21 DIAGNOSIS — F17200 Nicotine dependence, unspecified, uncomplicated: Secondary | ICD-10-CM | POA: Insufficient documentation

## 2011-04-21 HISTORY — DX: Headache: R51

## 2011-04-21 MED ORDER — DIPHENHYDRAMINE HCL 50 MG/ML IJ SOLN
50.0000 mg | Freq: Once | INTRAMUSCULAR | Status: AC
  Administered 2011-04-21: 50 mg via INTRAMUSCULAR
  Filled 2011-04-21: qty 1

## 2011-04-21 MED ORDER — KETOROLAC TROMETHAMINE 60 MG/2ML IM SOLN
60.0000 mg | Freq: Once | INTRAMUSCULAR | Status: AC
  Administered 2011-04-21: 60 mg via INTRAMUSCULAR
  Filled 2011-04-21: qty 2

## 2011-04-21 MED ORDER — METOCLOPRAMIDE HCL 5 MG/ML IJ SOLN
10.0000 mg | Freq: Once | INTRAMUSCULAR | Status: AC
  Administered 2011-04-21: 10 mg via INTRAMUSCULAR
  Filled 2011-04-21: qty 2

## 2011-04-21 NOTE — ED Notes (Addendum)
Migraine headache with dizziness that started this morning.  Denies n/v/d.  Light/sound sensitivity.

## 2011-04-21 NOTE — ED Provider Notes (Signed)
History   This chart was scribed for Allison Anger, DO by Clarita Crane. The patient was seen in room [facility] and the patient's care was started at 1:03PM.   CSN: [id number] Arrival date & time: 04/21/2011 11:31 AM  Chief Complaint  Patient presents with  . Headache   HPI Allison Bolton is a 40 y.o. female seen at 1:03 PM who presents to the Emergency Department complaining of gradual onset and persistence of constant acute flair of her chronic non-radiating HA generalized to entire head which began this morning after awaking. Notes associated dizziness and photophobia. Describes her dizziness as "my usual vertigo."  Describes her HA and vertigo as similar to previous symptoms experienced. Reports HA is aggravated by noise and light. Denies any change in this headache from her usual chronic headache pain pattern.  Denies N/V/D, denies CP/SOB, no abd pain, no fevers.     Past Medical History  Diagnosis Date  . Asthma   . Bipolar 1 disorder   . Fibromyalgia   . Kidney stones   . Diverticulosis   . Arthritis   . S/P colonoscopy     Dr. Karilyn Cota 2010: few small diverticula at sigmoid. otherwise normal.   . Dysrhythmia     sts "I have heart palpitations"  . Shortness of breath   . Meniere's disease   . Headache     Past Surgical History  Procedure Date  . Tonsillectomy and adenoidectomy   . Umbilical hernia repair Dec 2011    Dr. Lovell Sheehan  . Abdominal hysterectomy   . Hemorrhoid surgery   . Cholecystectomy   . Foot surgery   . Left ovarian removal   . Cesarean section   . Right ovarian removal   . Exploratory laparoscopy   . Wisdom teeth removal   . Multiple hernia repairs   . Tubes in ears   . Tonsillectomy   . Laparoscopy 02/19/2011    Procedure: LAPAROSCOPY DIAGNOSTIC;  Surgeon: Dalia Heading;  Location: [facility];  Service: General;  Laterality: N/A;  . Laparoscopic appendectomy 02/19/2011    Procedure: APPENDECTOMY LAPAROSCOPIC;  Surgeon: Dalia Heading;   Location: [facility];  Service: General;  Laterality: N/A;  . Appendectomy     Family History  Problem Relation Age of Onset  . Thyroid disease Mother   . Colon cancer      paternal and maternal grandfather  . Anesthesia problems Neg Hx   . Hypotension Neg Hx   . Malignant hyperthermia Neg Hx   . Pseudochol deficiency Neg Hx     History  Substance Use Topics  . Smoking status: Current Everyday Smoker -- 0.5 packs/day for 20 years    Types: Cigarettes  . Smokeless tobacco: Not on file  . Alcohol Use: Yes     occasional    Review of Systems ROS: Statement: All systems negative except as marked or noted in the HPI; Constitutional: Negative for fever and chills. ; ; Eyes: Negative for eye pain, redness and discharge. ; ; ENMT: Negative for ear pain, hoarseness, nasal congestion, sinus pressure and sore throat. ; ; Cardiovascular: Negative for chest pain, palpitations, diaphoresis, dyspnea and peripheral edema. ; ; Respiratory: Negative for cough, wheezing and stridor. ; ; Gastrointestinal: Negative for nausea, vomiting, diarrhea and abdominal pain, blood in stool, hematemesis, jaundice and rectal bleeding. . ; ; Genitourinary: Negative for dysuria, flank pain and hematuria. ; ; Musculoskeletal: Negative for back pain and neck pain. Negative for swelling and trauma.; ; Skin:  Negative for pruritus, rash, abrasions, blisters, bruising and skin lesion.; ; Neuro: Negative for lightheadedness and neck stiffness. Negative for weakness, altered level of consciousness , altered mental status, extremity weakness, paresthesias, involuntary movement, seizure and syncope.  +headache, vertigo.     Allergies  Amoxicillin  Home Medications   Current Outpatient Rx  Name Route Sig Dispense Refill  . ALBUTEROL SULFATE (2.5 MG/3ML) 0.083% IN NEBU Nebulization Take 2.5 mg by nebulization every 6 (six) hours as needed.      . ALBUTEROL 90 MCG/ACT IN AERS Inhalation Inhale 2 puffs into the lungs every 6 (six)  hours as needed. For shortness of breath    . CLONAZEPAM 1 MG PO TABS Oral Take 1 mg by mouth 2 (two) times daily.      . CYCLOBENZAPRINE HCL 10 MG PO TABS Oral Take 10 mg by mouth 2 (two) times daily as needed. For muscle spasms     . CYCLOBENZAPRINE HCL PO Oral Take 10 mg by mouth 2 (two) times daily.     Marland Kitchen DIAZEPAM 10 MG PO TABS Oral Take 10 mg by mouth 4 (four) times daily - after meals and at bedtime. Takes three times daily as needed and every night    . DULOXETINE HCL 30 MG PO CPEP Oral Take 30 mg by mouth 2 (two) times daily.      Marland Kitchen ESTROGENS CONJUGATED 0.625 MG PO TABS Oral Take 0.625 mg by mouth daily.     Marland Kitchen GABAPENTIN 600 MG PO TABS Oral Take 600 mg by mouth 4 (four) times daily.      Marland Kitchen HYDROCODONE-ACETAMINOPHEN 5-325 MG PO TABS  1 - 2 tablets po q4hrs prn pain 50 tablet Ran out  . MECLIZINE HCL 25 MG PO TABS Oral Take 25 mg by mouth 3 (three) times daily as needed. For vertigo     . MUPIROCIN CALCIUM 2 % NA OINT Nasal Place into the nose 2 (two) times daily. Use one-half of tube in each nostril twice daily for five (5) days. After application, press sides of nose together and gently massage.     Marland Kitchen OMEPRAZOLE 20 MG PO CPDR Oral Take 20 mg by mouth daily.      . OXYCODONE-ACETAMINOPHEN 7.5-325 MG PO TABS Oral Take 1 tablet by mouth every 4 (four) hours as needed. For pain   Ran out    BP 111/71  Pulse 76  Temp(Src) 98 F (36.7 C) (Oral)  Resp 16  Ht 5\' 6"  (1.676 m)  Wt 200 lb (90.719 kg)  BMI 32.28 kg/m2  SpO2 100%  Physical Exam 1:05PM: Physical examination:  Nursing notes reviewed; Vital signs and O2 SAT reviewed;  Constitutional: Well developed, Well nourished, Well hydrated, Uncomfortable appearing. Head:  Normocephalic, atraumatic; Eyes: EOMI, PERRL, No scleral icterus; ENMT: TM's clear bilat.  Mouth and pharynx normal, Mucous membranes moist; Neck: Supple, Full range of motion, No lymphadenopathy; Cardiovascular: Regular rate and rhythm, No murmur, rub, or gallop;  Respiratory: Breath sounds clear & equal bilaterally, No rales, rhonchi, wheezes, or rub, Normal respiratory effort/excursion; Chest: Nontender, Movement normal; Abdomen: Soft, Nontender, Nondistended, Normal bowel sounds; Extremities: Pulses normal, No tenderness, No edema, No calf edema or asymmetry.; Neuro: AA&Ox3, Major CN grossly intact.  Speech clear, no facial droop.  No gross focal motor or sensory deficits in extremities.; Skin: Color normal, Warm, Dry, no rash.    ED Course  Procedures  MDM  MDM Reviewed: previous chart, nursing note and vitals    Medications given in  ED:  diphenhydrAMINE (BENADRYL) injection 50 mg (not administered)  metoCLOPramide (REGLAN) 5 MG/ML injection 10 mg (not administered)  ketorolac (TORADOL) injection 60 mg (not administered)   2:21 PM:  Wants to go home now.  Feels better after meds.  Dx d/w pt.  Questions answered.  Verb understanding, agreeable to d/c home with outpt f/u.    [facility] M  I personally performed the services described in this documentation, which was scribed in my presence. The recorded information has been reviewed and considered.    Allison Anger, DO 04/23/11 1907

## 2011-04-21 NOTE — ED Notes (Signed)
States she was dropped off and will be able to get a ride home

## 2011-04-25 ENCOUNTER — Encounter (HOSPITAL_COMMUNITY): Payer: Self-pay | Admitting: Emergency Medicine

## 2011-04-25 ENCOUNTER — Emergency Department (HOSPITAL_COMMUNITY)
Admission: EM | Admit: 2011-04-25 | Discharge: 2011-04-25 | Disposition: A | Discharge status: Home / Self Care | Payer: Medicaid Other | Attending: Emergency Medicine | Admitting: Emergency Medicine

## 2011-04-25 ENCOUNTER — Other Ambulatory Visit: Payer: Self-pay

## 2011-04-25 DIAGNOSIS — H53149 Visual discomfort, unspecified: Secondary | ICD-10-CM | POA: Insufficient documentation

## 2011-04-25 DIAGNOSIS — J45909 Unspecified asthma, uncomplicated: Secondary | ICD-10-CM | POA: Insufficient documentation

## 2011-04-25 DIAGNOSIS — H539 Unspecified visual disturbance: Secondary | ICD-10-CM | POA: Insufficient documentation

## 2011-04-25 DIAGNOSIS — F319 Bipolar disorder, unspecified: Secondary | ICD-10-CM | POA: Insufficient documentation

## 2011-04-25 DIAGNOSIS — K5730 Diverticulosis of large intestine without perforation or abscess without bleeding: Secondary | ICD-10-CM | POA: Insufficient documentation

## 2011-04-25 DIAGNOSIS — Z87442 Personal history of urinary calculi: Secondary | ICD-10-CM | POA: Insufficient documentation

## 2011-04-25 DIAGNOSIS — F17200 Nicotine dependence, unspecified, uncomplicated: Secondary | ICD-10-CM | POA: Insufficient documentation

## 2011-04-25 DIAGNOSIS — M129 Arthropathy, unspecified: Secondary | ICD-10-CM | POA: Insufficient documentation

## 2011-04-25 DIAGNOSIS — IMO0001 Reserved for inherently not codable concepts without codable children: Secondary | ICD-10-CM | POA: Insufficient documentation

## 2011-04-25 DIAGNOSIS — R51 Headache: Secondary | ICD-10-CM

## 2011-04-25 MED ORDER — DIPHENHYDRAMINE HCL 50 MG/ML IJ SOLN
50.0000 mg | Freq: Once | INTRAMUSCULAR | Status: AC
  Administered 2011-04-25: 50 mg via INTRAMUSCULAR
  Filled 2011-04-25: qty 1

## 2011-04-25 MED ORDER — ONDANSETRON 8 MG PO TBDP
8.0000 mg | ORAL_TABLET | Freq: Once | ORAL | Status: AC
  Administered 2011-04-25: 8 mg via ORAL
  Filled 2011-04-25: qty 1

## 2011-04-25 MED ORDER — KETOROLAC TROMETHAMINE 60 MG/2ML IM SOLN
60.0000 mg | Freq: Once | INTRAMUSCULAR | Status: AC
  Administered 2011-04-25: 60 mg via INTRAMUSCULAR
  Filled 2011-04-25: qty 2

## 2011-04-25 MED ORDER — DROPERIDOL 2.5 MG/ML IJ SOLN
2.0000 mg | Freq: Once | INTRAMUSCULAR | Status: AC
  Administered 2011-04-25: 2 mg via INTRAVENOUS
  Filled 2011-04-25: qty 2

## 2011-04-25 NOTE — ED Notes (Signed)
Pt c/o migraine and dizziness with n and blurred vision. Pt states she deals with this daily and her pcp is trying to get her in with Dr.[place].

## 2011-04-25 NOTE — ED Provider Notes (Signed)
History     CSN: [id number] Arrival date & time: 04/25/2011 10:44 AM   First MD Initiated Contact with Patient 04/25/11 1054      Chief Complaint  Patient presents with  . Migraine    (Consider location/radiation/quality/duration/timing/severity/associated sxs/prior treatment) Patient is a 40 y.o. female presenting with migraine.  Migraine This is a chronic problem. The current episode started more than 1 month ago (She describes chronic headache since the beginning of last month in association with intermittent dizziness and tinnitus.  Her pcp is  referring her to Dr. Gerilyn Pilgrim of neurology for further management since symptoms have been resistent to treatment.  ). The problem has been unchanged (Her headache pain is the same chronic pain with no changes today,  and is associated with photo and phonophobia.). Associated symptoms include headaches. Pertinent negatives include no abdominal pain, arthralgias, chest pain, congestion, fever, joint swelling, myalgias, nausea, neck pain, numbness, rash, sore throat, vomiting or weakness. Exacerbated by: Bright rooms and loud noises makes worse. Treatments tried: She is taking flexeril and meclizine with no improvement in symptoms.    Past Medical History  Diagnosis Date  . Asthma   . Bipolar 1 disorder   . Fibromyalgia   . Kidney stones   . Diverticulosis   . Arthritis   . S/P colonoscopy     Dr. Karilyn Cota 2010: few small diverticula at sigmoid. otherwise normal.   . Dysrhythmia     sts "I have heart palpitations"  . Shortness of breath   . Meniere's disease   . Headache     Past Surgical History  Procedure Date  . Tonsillectomy and adenoidectomy   . Umbilical hernia repair Dec 2011    Dr. Lovell Sheehan  . Abdominal hysterectomy   . Hemorrhoid surgery   . Cholecystectomy   . Foot surgery   . Left ovarian removal   . Cesarean section   . Right ovarian removal   . Exploratory laparoscopy   . Wisdom teeth removal   . Multiple hernia  repairs   . Tubes in ears   . Tonsillectomy   . Laparoscopy 02/19/2011    Procedure: LAPAROSCOPY DIAGNOSTIC;  Surgeon: Dalia Heading;  Location: [facility];  Service: General;  Laterality: N/A;  . Laparoscopic appendectomy 02/19/2011    Procedure: APPENDECTOMY LAPAROSCOPIC;  Surgeon: Dalia Heading;  Location: [facility];  Service: General;  Laterality: N/A;  . Appendectomy     Family History  Problem Relation Age of Onset  . Thyroid disease Mother   . Colon cancer      paternal and maternal grandfather  . Anesthesia problems Neg Hx   . Hypotension Neg Hx   . Malignant hyperthermia Neg Hx   . Pseudochol deficiency Neg Hx     History  Substance Use Topics  . Smoking status: Current Everyday Smoker -- 0.5 packs/day for 20 years    Types: Cigarettes  . Smokeless tobacco: Not on file  . Alcohol Use: Yes     occasional    OB History    Grav Para Term Preterm Abortions TAB SAB Ect Mult Living                  Review of Systems  Constitutional: Negative for fever.  HENT: Negative for ear pain, congestion, sore throat, rhinorrhea, neck pain and neck stiffness.   Eyes: Positive for photophobia and visual disturbance. Negative for redness.  Respiratory: Negative for chest tightness and shortness of breath.   Cardiovascular: Negative for chest  pain.  Gastrointestinal: Negative for nausea, vomiting and abdominal pain.  Genitourinary: Negative.   Musculoskeletal: Negative for myalgias, joint swelling and arthralgias.  Skin: Negative.  Negative for rash and wound.  Neurological: Positive for headaches. Negative for dizziness, weakness, light-headedness and numbness.  Hematological: Negative.   Psychiatric/Behavioral: Negative.     Allergies  Amoxicillin  Home Medications   Current Outpatient Rx  Name Route Sig Dispense Refill  . ALBUTEROL SULFATE (2.5 MG/3ML) 0.083% IN NEBU Nebulization Take 2.5 mg by nebulization every 6 (six) hours as needed.      . ALBUTEROL 90 MCG/ACT IN AERS  Inhalation Inhale 2 puffs into the lungs every 6 (six) hours as needed. For shortness of breath    . CLONAZEPAM 1 MG PO TABS Oral Take 1 mg by mouth 2 (two) times daily.      . CYCLOBENZAPRINE HCL 10 MG PO TABS Oral Take 10 mg by mouth 2 (two) times daily as needed. For muscle spasms     . CYCLOBENZAPRINE HCL PO Oral Take 10 mg by mouth 2 (two) times daily.     Marland Kitchen DIAZEPAM 10 MG PO TABS Oral Take 10 mg by mouth 4 (four) times daily - after meals and at bedtime. Takes three times daily as needed and every night    . DULOXETINE HCL 30 MG PO CPEP Oral Take 30 mg by mouth 2 (two) times daily.      Marland Kitchen ESTROGENS CONJUGATED 0.625 MG PO TABS Oral Take 0.625 mg by mouth daily.     Marland Kitchen GABAPENTIN 600 MG PO TABS Oral Take 600 mg by mouth 4 (four) times daily.      Marland Kitchen HYDROCODONE-ACETAMINOPHEN 5-325 MG PO TABS  1 - 2 tablets po q4hrs prn pain 50 tablet 0  . MECLIZINE HCL 25 MG PO TABS Oral Take 25 mg by mouth 3 (three) times daily as needed. For vertigo     . MUPIROCIN CALCIUM 2 % NA OINT Nasal Place into the nose 2 (two) times daily. Use one-half of tube in each nostril twice daily for five (5) days. After application, press sides of nose together and gently massage.     Marland Kitchen OMEPRAZOLE 20 MG PO CPDR Oral Take 20 mg by mouth daily.      . OXYCODONE-ACETAMINOPHEN 7.5-325 MG PO TABS Oral Take 1 tablet by mouth every 4 (four) hours as needed. For pain       BP 123/84  Pulse 87  Temp 97.9 F (36.6 C)  Resp 20  Ht 5\' 6"  (1.676 m)  Wt 200 lb (90.719 kg)  BMI 32.28 kg/m2  SpO2 99%  Physical Exam  Nursing note and vitals reviewed. Constitutional: She is oriented to person, place, and time. She appears well-developed and well-nourished.       Uncomfortable appearing  HENT:  Head: Normocephalic and atraumatic.  Mouth/Throat: Oropharynx is clear and moist.  Eyes: EOM are normal. Pupils are equal, round, and reactive to light.  Neck: Normal range of motion. Neck supple.  Cardiovascular: Normal rate and normal  heart sounds.   Pulmonary/Chest: Effort normal.  Abdominal: Soft. There is no tenderness.  Musculoskeletal: Normal range of motion.  Lymphadenopathy:    She has no cervical adenopathy.  Neurological: She is alert and oriented to person, place, and time. She has normal strength. She displays normal reflexes. No cranial nerve deficit or sensory deficit. She exhibits normal muscle tone. She displays a negative Romberg sign. Coordination and gait normal. GCS eye subscore is 4. GCS  verbal subscore is 5. GCS motor subscore is 6.       Normal heel-shin, normal rapid alternating movements.  Skin: Skin is warm and dry. No rash noted.  Psychiatric: She has a normal mood and affect. Her speech is normal and behavior is normal. Thought content normal. Cognition and memory are normal.    ED Course  Procedures (including critical care time)  Labs Reviewed - No data to display No results found.   No diagnosis found.    MDM  Chronic headache with no new symptoms today, no neuro deficits on exam.  Previous visits reviewed including normal head Ct scan.            Candis Musa, PA 04/25/11 1422   Date: 04/25/2011  Rate: 61  Rhythm: normal sinus rhythm  QRS Axis: normal  Intervals: normal  ST/T Wave abnormalities: normal  Conduction Disutrbances:none  Narrative Interpretation: QTc 450  Old EKG Reviewed: unchanged   ekg obtained prior to medicating pt with droperidol,  Which did improve her headache pain prior to dc home.   Candis Musa, PA 04/25/11 1736

## 2011-04-25 NOTE — ED Notes (Signed)
IV started and pt. Medicated per orders.  Pt. Resting on stretcher.  Will continue to monitor.

## 2011-04-25 NOTE — ED Notes (Signed)
Pt. Alert and oriented.  Respirations even and unlabored.  Pt. Ambulated with steady gait.  Reviewed D/C instructions with patient and patient verbalized understanding.  NAD noted.  Pt. To lobby friend to transport home.

## 2011-04-27 ENCOUNTER — Other Ambulatory Visit: Payer: Self-pay

## 2011-04-27 ENCOUNTER — Encounter (HOSPITAL_COMMUNITY): Payer: Self-pay | Admitting: Emergency Medicine

## 2011-04-27 ENCOUNTER — Emergency Department (HOSPITAL_COMMUNITY)
Admission: EM | Admit: 2011-04-27 | Discharge: 2011-04-27 | Disposition: A | Discharge status: Home / Self Care | Payer: Medicaid Other | Attending: Emergency Medicine | Admitting: Emergency Medicine

## 2011-04-27 DIAGNOSIS — J45909 Unspecified asthma, uncomplicated: Secondary | ICD-10-CM | POA: Insufficient documentation

## 2011-04-27 DIAGNOSIS — H8109 Meniere's disease, unspecified ear: Secondary | ICD-10-CM | POA: Insufficient documentation

## 2011-04-27 DIAGNOSIS — R51 Headache: Secondary | ICD-10-CM | POA: Insufficient documentation

## 2011-04-27 DIAGNOSIS — F319 Bipolar disorder, unspecified: Secondary | ICD-10-CM | POA: Insufficient documentation

## 2011-04-27 DIAGNOSIS — F17200 Nicotine dependence, unspecified, uncomplicated: Secondary | ICD-10-CM | POA: Insufficient documentation

## 2011-04-27 DIAGNOSIS — IMO0001 Reserved for inherently not codable concepts without codable children: Secondary | ICD-10-CM | POA: Insufficient documentation

## 2011-04-27 DIAGNOSIS — Z87442 Personal history of urinary calculi: Secondary | ICD-10-CM | POA: Insufficient documentation

## 2011-04-27 DIAGNOSIS — M129 Arthropathy, unspecified: Secondary | ICD-10-CM | POA: Insufficient documentation

## 2011-04-27 MED ORDER — SODIUM CHLORIDE 0.9 % IV BOLUS (SEPSIS)
1000.0000 mL | Freq: Once | INTRAVENOUS | Status: AC
  Administered 2011-04-27: 1000 mL via INTRAVENOUS

## 2011-04-27 MED ORDER — DIPHENHYDRAMINE HCL 50 MG/ML IJ SOLN
25.0000 mg | Freq: Once | INTRAMUSCULAR | Status: AC
  Administered 2011-04-27: 25 mg via INTRAVENOUS
  Filled 2011-04-27: qty 1

## 2011-04-27 MED ORDER — METOCLOPRAMIDE HCL 5 MG/ML IJ SOLN
10.0000 mg | Freq: Once | INTRAMUSCULAR | Status: AC
  Administered 2011-04-27: 10 mg via INTRAVENOUS
  Filled 2011-04-27: qty 2

## 2011-04-27 MED ORDER — KETOROLAC TROMETHAMINE 30 MG/ML IJ SOLN
30.0000 mg | Freq: Once | INTRAMUSCULAR | Status: AC
  Administered 2011-04-27: 30 mg via INTRAVENOUS
  Filled 2011-04-27: qty 1

## 2011-04-27 NOTE — ED Notes (Signed)
Patient reports dizziness and migraine. Per patient intermittently since September 1st. Patient reports being seen here 04/25/11 for it and 04/23/11 at [facility]. Denies syncopal episode or LOC.

## 2011-04-27 NOTE — ED Notes (Signed)
Pt left ED stated no needs

## 2011-04-27 NOTE — ED Notes (Signed)
Pt left the er stating no needs 

## 2011-04-27 NOTE — ED Provider Notes (Signed)
Scribed for Allison Hutching, MD, the patient was seen in room APA04/AP[facility]04 . This chart was scribed by Ellie Lunch.   CSN: [id number] Arrival date & time: 04/27/2011  7:12 PM   First MD Initiated Contact with Patient 04/27/11 1916      Chief Complaint  Patient presents with  . Dizziness  . Migraine    (Consider location/radiation/quality/duration/timing/severity/associated sxs/prior treatment) HPI Allison Bolton is a 40 y.o. female who presents to the Emergency Department complaining of dizziness. Pt with a history of Meniere's disease dx 03/08/2011, c/o dizziness and associated migraine HA. Pt reports HA and dizziness have been constant since 03/08/2011. Pt describes Sx as severe.  Pt came in because she says she did not want to pass out. Pt  takes Excedrin, benadryl, and nausea medicine for treatment with no improvement. Reports she has been able to keep down fluids. Denies syncope or LOC. Pt called PCP Dr Robynn Pane in ED and was referred to ED. Pt has recent visits to ED 10/19 and [facility] 10/17.  Pt says she is waiting to get an appointment with Dr. Lennie Hummer.  Past Medical History  Diagnosis Date  . Asthma   . Bipolar 1 disorder   . Fibromyalgia   . Kidney stones   . Diverticulosis   . Arthritis   . S/P colonoscopy     Dr. Karilyn Cota 2010: few small diverticula at sigmoid. otherwise normal.   . Dysrhythmia     sts "I have heart palpitations"  . Shortness of breath   . Meniere's disease   . Headache     Past Surgical History  Procedure Date  . Tonsillectomy and adenoidectomy   . Umbilical hernia repair Dec 2011    Dr. Lovell Sheehan  . Abdominal hysterectomy   . Hemorrhoid surgery   . Cholecystectomy   . Foot surgery   . Left ovarian removal   . Cesarean section   . Right ovarian removal   . Exploratory laparoscopy   . Wisdom teeth removal   . Multiple hernia repairs   . Tubes in ears   . Tonsillectomy   . Laparoscopy 02/19/2011    Procedure: LAPAROSCOPY DIAGNOSTIC;  Surgeon: Dalia Heading;  Location: [facility];  Service: General;  Laterality: N/A;  . Laparoscopic appendectomy 02/19/2011    Procedure: APPENDECTOMY LAPAROSCOPIC;  Surgeon: Dalia Heading;  Location: [facility];  Service: General;  Laterality: N/A;  . Appendectomy     Family History  Problem Relation Age of Onset  . Thyroid disease Mother   . Colon cancer      paternal and maternal grandfather  . Anesthesia problems Neg Hx   . Hypotension Neg Hx   . Malignant hyperthermia Neg Hx   . Pseudochol deficiency Neg Hx   . Meniere's disease Other     History  Substance Use Topics  . Smoking status: Current Everyday Smoker -- 0.5 packs/day for 20 years    Types: Cigarettes  . Smokeless tobacco: Never Used  . Alcohol Use: No     not since June 2012    OB History    Grav Para Term Preterm Abortions TAB SAB Ect Mult Living   5 2 2  3  2   2      Review of Systems 10 Systems reviewed and are negative for acute change except as noted in the HPI.  Allergies  Amoxicillin  Home Medications   Current Outpatient Rx  Name Route Sig Dispense Refill  . ALBUTEROL SULFATE (2.5 MG/3ML) 0.083%  IN NEBU Nebulization Take 2.5 mg by nebulization every 6 (six) hours as needed.      . ALBUTEROL 90 MCG/ACT IN AERS Inhalation Inhale 2 puffs into the lungs every 6 (six) hours as needed. For shortness of breath    . CLONAZEPAM 1 MG PO TABS Oral Take 1 mg by mouth 2 (two) times daily.      . CYCLOBENZAPRINE HCL 10 MG PO TABS Oral Take 10 mg by mouth 2 (two) times daily as needed. For muscle spasms     . CYCLOBENZAPRINE HCL PO Oral Take 10 mg by mouth 2 (two) times daily.     Marland Kitchen DIAZEPAM 10 MG PO TABS Oral Take 10 mg by mouth 4 (four) times daily - after meals and at bedtime. Takes three times daily as needed and every night    . DULOXETINE HCL 30 MG PO CPEP Oral Take 30 mg by mouth 2 (two) times daily.      Marland Kitchen ESTROGENS CONJUGATED 0.625 MG PO TABS Oral Take 0.625 mg by mouth daily.     Marland Kitchen GABAPENTIN 600 MG PO TABS Oral Take  600 mg by mouth 4 (four) times daily.      Marland Kitchen HYDROCODONE-ACETAMINOPHEN 5-325 MG PO TABS  1 - 2 tablets po q4hrs prn pain 50 tablet 0  . MECLIZINE HCL 25 MG PO TABS Oral Take 25 mg by mouth 3 (three) times daily as needed. For vertigo     . MUPIROCIN CALCIUM 2 % NA OINT Nasal Place into the nose 2 (two) times daily. Use one-half of tube in each nostril twice daily for five (5) days. After application, press sides of nose together and gently massage.     Marland Kitchen OMEPRAZOLE 20 MG PO CPDR Oral Take 20 mg by mouth daily.      . OXYCODONE-ACETAMINOPHEN 7.5-325 MG PO TABS Oral Take 1 tablet by mouth every 4 (four) hours as needed. For pain       BP 110/78  Pulse 82  Temp(Src) 98.4 F (36.9 C) (Oral)  Resp 16  SpO2 98%  Physical Exam  Nursing note and vitals reviewed. Constitutional: She is oriented to person, place, and time. She appears well-developed and well-nourished.  HENT:  Head: Normocephalic and atraumatic.  Eyes: Conjunctivae and EOM are normal.       photophobia  Neck: Normal range of motion. Neck supple.  Cardiovascular: Normal rate, regular rhythm and normal heart sounds.   Pulmonary/Chest: Effort normal and breath sounds normal.  Abdominal: Soft. There is no tenderness.  Musculoskeletal: Normal range of motion.  Neurological: She is alert and oriented to person, place, and time.  Skin: Skin is warm and dry.  Psychiatric: She has a normal mood and affect.   ED Course  Procedures (including critical care time)  OTHER DATA REVIEWED: Nursing notes, vital signs, and past medical records reviewed.  DIAGNOSTIC STUDIES: Oxygen Saturation is 98% on room air, normal by my interpretation.    7:34 PM. IV fluids. Review CT scan. . And treat pain benadryl, relagan, and toradol. Medications  diphenhydrAMINE (BENADRYL) injection 25 mg   metoCLOPramide (REGLAN) 5 MG/ML injection 10 mg   ketorolac (TORADOL) 30 MG/ML injection 30 mg   sodium chloride 0.9 % bolus 1,000 mL    Date:  04/27/2011  Rate: 70  Rhythm: normal sinus rhythm  QRS Axis: normal  Intervals: normal  ST/T Wave abnormalities: normal  Conduction Disutrbances:none  Narrative Interpretation:   Old EKG Reviewed: none available  No diagnosis found.  MDM   I personally performed the services described in this documentation, which was scribed in my presence. The recorded information has been reviewed and considered.  No neuro deficits. Patient has had recent CT scan of head. No clinical evidence of syncope patient is alert and oriented. Feels much better after IV fluids and migraine medication.  CT head from 03-27-11 reviewed and read as normal       Allison Hutching, MD 04/27/11 2218

## 2011-04-29 NOTE — ED Provider Notes (Signed)
Medical screening examination/treatment/procedure(s) were performed by non-physician practitioner and as supervising physician I was immediately available for consultation/collaboration.   Nelia Shi, MD 04/29/11 2225

## 2011-04-30 ENCOUNTER — Emergency Department (HOSPITAL_COMMUNITY)
Admission: EM | Admit: 2011-04-30 | Discharge: 2011-04-30 | Disposition: A | Discharge status: Home / Self Care | Payer: Medicaid Other | Source: Home / Self Care | Attending: Emergency Medicine | Admitting: Emergency Medicine

## 2011-04-30 ENCOUNTER — Emergency Department (HOSPITAL_COMMUNITY)
Admission: EM | Admit: 2011-04-30 | Discharge: 2011-04-30 | Disposition: A | Discharge status: Home / Self Care | Payer: Medicaid Other | Attending: Emergency Medicine | Admitting: Emergency Medicine

## 2011-04-30 ENCOUNTER — Encounter (HOSPITAL_COMMUNITY): Payer: Self-pay | Admitting: *Deleted

## 2011-04-30 DIAGNOSIS — IMO0001 Reserved for inherently not codable concepts without codable children: Secondary | ICD-10-CM | POA: Insufficient documentation

## 2011-04-30 DIAGNOSIS — R51 Headache: Secondary | ICD-10-CM | POA: Insufficient documentation

## 2011-04-30 DIAGNOSIS — F319 Bipolar disorder, unspecified: Secondary | ICD-10-CM | POA: Insufficient documentation

## 2011-04-30 DIAGNOSIS — F17200 Nicotine dependence, unspecified, uncomplicated: Secondary | ICD-10-CM | POA: Insufficient documentation

## 2011-04-30 DIAGNOSIS — R519 Headache, unspecified: Secondary | ICD-10-CM

## 2011-04-30 DIAGNOSIS — J45909 Unspecified asthma, uncomplicated: Secondary | ICD-10-CM | POA: Insufficient documentation

## 2011-04-30 DIAGNOSIS — H8109 Meniere's disease, unspecified ear: Secondary | ICD-10-CM | POA: Insufficient documentation

## 2011-04-30 DIAGNOSIS — Z87442 Personal history of urinary calculi: Secondary | ICD-10-CM | POA: Insufficient documentation

## 2011-04-30 MED ORDER — DIPHENHYDRAMINE HCL 50 MG/ML IJ SOLN
50.0000 mg | Freq: Once | INTRAMUSCULAR | Status: AC
  Administered 2011-04-30: 50 mg via INTRAMUSCULAR
  Filled 2011-04-30: qty 1

## 2011-04-30 MED ORDER — DEXTROSE 5 % IV SOLN
500.0000 mg | INTRAVENOUS | Status: AC
  Administered 2011-04-30: 500 mg via INTRAVENOUS
  Filled 2011-04-30: qty 5

## 2011-04-30 MED ORDER — DEXAMETHASONE 4 MG PO TABS
ORAL_TABLET | ORAL | Status: AC
  Filled 2011-04-30: qty 3

## 2011-04-30 MED ORDER — MORPHINE SULFATE 4 MG/ML IJ SOLN
4.0000 mg | Freq: Once | INTRAMUSCULAR | Status: AC
  Administered 2011-04-30: 4 mg via INTRAVENOUS
  Filled 2011-04-30: qty 1

## 2011-04-30 MED ORDER — DEXAMETHASONE 4 MG PO TABS
10.0000 mg | ORAL_TABLET | ORAL | Status: AC
  Administered 2011-04-30: 10 mg via ORAL
  Filled 2011-04-30: qty 1

## 2011-04-30 MED ORDER — KETOROLAC TROMETHAMINE 60 MG/2ML IM SOLN
60.0000 mg | Freq: Once | INTRAMUSCULAR | Status: AC
  Administered 2011-04-30: 60 mg via INTRAMUSCULAR
  Filled 2011-04-30: qty 2

## 2011-04-30 MED ORDER — METOCLOPRAMIDE HCL 5 MG/ML IJ SOLN
10.0000 mg | Freq: Once | INTRAMUSCULAR | Status: AC
  Administered 2011-04-30: 10 mg via INTRAMUSCULAR
  Filled 2011-04-30: qty 2

## 2011-04-30 MED ORDER — DIVALPROEX SODIUM ER 250 MG PO TB24: 250.0000 mg | ORAL_TABLET | Freq: Two times a day (BID) | ORAL | Status: DC

## 2011-04-30 MED ORDER — SODIUM CHLORIDE 0.9 % IV SOLN: Freq: Once | INTRAVENOUS | Status: DC

## 2011-04-30 MED ORDER — VALPROATE SODIUM 500 MG/5ML IV SOLN
INTRAVENOUS | Status: AC
  Filled 2011-04-30: qty 5

## 2011-04-30 NOTE — ED Provider Notes (Signed)
History     CSN: [id number] Arrival date & time: 04/30/2011  4:42 AM   None     Chief Complaint  Patient presents with  . Headache    (Consider location/radiation/quality/duration/timing/severity/associated sxs/prior treatment) HPI Comments: Patient is a 40 year old female with a recent history of new onset headaches in the last 2 months who presents with ongoing headaches.  She has been evaluated by her family Dr. with a CT scan as well as a CT scan in the emergency department in September which was read as normal. She denies associated fever, stiff neck, change in vision, numbness weakness or ataxia. She states that the headaches are constant, severe and was treated with admission to the hospital with morphine over the weekend. She states she has been taking her medication at home including Topamax without relief. The headaches are frontal, right-sided, throbbing  The history is provided by the patient and medical records.    Past Medical History  Diagnosis Date  . Asthma   . Bipolar 1 disorder   . Fibromyalgia   . Kidney stones   . Diverticulosis   . Arthritis   . S/P colonoscopy     Dr. Karilyn Cota 2010: few small diverticula at sigmoid. otherwise normal.   . Dysrhythmia     sts "I have heart palpitations"  . Shortness of breath   . Meniere's disease   . Headache     Past Surgical History  Procedure Date  . Tonsillectomy and adenoidectomy   . Umbilical hernia repair Dec 2011    Dr. Lovell Sheehan  . Abdominal hysterectomy   . Hemorrhoid surgery   . Cholecystectomy   . Foot surgery   . Left ovarian removal   . Cesarean section   . Right ovarian removal   . Exploratory laparoscopy   . Wisdom teeth removal   . Multiple hernia repairs   . Tubes in ears   . Tonsillectomy   . Laparoscopy 02/19/2011    Procedure: LAPAROSCOPY DIAGNOSTIC;  Surgeon: Dalia Heading;  Location: [facility];  Service: General;  Laterality: N/A;  . Laparoscopic appendectomy 02/19/2011    Procedure:  APPENDECTOMY LAPAROSCOPIC;  Surgeon: Dalia Heading;  Location: [facility];  Service: General;  Laterality: N/A;  . Appendectomy     Family History  Problem Relation Age of Onset  . Thyroid disease Mother   . Colon cancer      paternal and maternal grandfather  . Anesthesia problems Neg Hx   . Hypotension Neg Hx   . Malignant hyperthermia Neg Hx   . Pseudochol deficiency Neg Hx   . Meniere's disease Other     History  Substance Use Topics  . Smoking status: Current Everyday Smoker -- 0.5 packs/day for 20 years    Types: Cigarettes  . Smokeless tobacco: Never Used  . Alcohol Use: No     not since June 2012    OB History    Grav Para Term Preterm Abortions TAB SAB Ect Mult Living   5 2 2  3  2   2       Review of Systems  All other systems reviewed and are negative.    Allergies  Amoxicillin  Home Medications   Current Outpatient Rx  Name Route Sig Dispense Refill  . ALBUTEROL SULFATE (2.5 MG/3ML) 0.083% IN NEBU Nebulization Take 2.5 mg by nebulization every 6 (six) hours as needed.      . ALBUTEROL 90 MCG/ACT IN AERS Inhalation Inhale 2 puffs into the lungs every  6 (six) hours as needed. For shortness of breath    . CYCLOBENZAPRINE HCL PO Oral Take 10 mg by mouth 2 (two) times daily.     . DULOXETINE HCL 30 MG PO CPEP Oral Take 30 mg by mouth 2 (two) times daily.      Marland Kitchen ESTROGENS CONJUGATED 0.625 MG PO TABS Oral Take 0.625 mg by mouth daily.     Marland Kitchen GABAPENTIN 600 MG PO TABS Oral Take 600 mg by mouth 4 (four) times daily.      Marland Kitchen MECLIZINE HCL 25 MG PO TABS Oral Take 25 mg by mouth 3 (three) times daily as needed. For vertigo     . MUPIROCIN CALCIUM 2 % NA OINT Nasal Place into the nose 2 (two) times daily. Use one-half of tube in each nostril twice daily for five (5) days. After application, press sides of nose together and gently massage.     Marland Kitchen OMEPRAZOLE 20 MG PO CPDR Oral Take 20 mg by mouth daily.      Marland Kitchen CLONAZEPAM 1 MG PO TABS Oral Take 1 mg by mouth 2 (two) times  daily.      . CYCLOBENZAPRINE HCL 10 MG PO TABS Oral Take 10 mg by mouth 2 (two) times daily as needed. For muscle spasms     . DIAZEPAM 10 MG PO TABS Oral Take 10 mg by mouth 4 (four) times daily - after meals and at bedtime. Takes three times daily as needed and every night    . HYDROCODONE-ACETAMINOPHEN 5-325 MG PO TABS  1 - 2 tablets po q4hrs prn pain 50 tablet 0  . OXYCODONE-ACETAMINOPHEN 7.5-325 MG PO TABS Oral Take 1 tablet by mouth every 4 (four) hours as needed. For pain       BP 111/79  Pulse 78  Temp(Src) 98.2 F (36.8 C) (Oral)  Resp 20  Ht 5\' 6"  (1.676 m)  Wt 200 lb (90.719 kg)  BMI 32.28 kg/m2  SpO2 99%  Physical Exam  Nursing note and vitals reviewed. Constitutional: She appears well-developed and well-nourished. No distress.  HENT:  Head: Normocephalic and atraumatic.  Mouth/Throat: Oropharynx is clear and moist. No oropharyngeal exudate.  Eyes: Conjunctivae and EOM are normal. Pupils are equal, round, and reactive to light. Right eye exhibits no discharge. Left eye exhibits no discharge. No scleral icterus.  Neck: Normal range of motion. Neck supple. No JVD present. No thyromegaly present.  Cardiovascular: Normal rate, regular rhythm, normal heart sounds and intact distal pulses.  Exam reveals no gallop and no friction rub.   No murmur heard. Pulmonary/Chest: Effort normal and breath sounds normal. No respiratory distress. She has no wheezes. She has no rales.  Abdominal: Soft. Bowel sounds are normal. She exhibits no distension and no mass. There is no tenderness.  Musculoskeletal: Normal range of motion. She exhibits no edema and no tenderness.  Lymphadenopathy:    She has no cervical adenopathy.  Neurological: She is alert. Coordination normal.  Skin: Skin is warm and dry. No rash noted. No erythema.  Psychiatric: She has a normal mood and affect. Her behavior is normal.    ED Course  Procedures (including critical care time)  Labs Reviewed - No data to  display No results found.   No diagnosis found.    MDM  Patient has normal vital signs including blood pressure, pulse, oxygen saturation, temperature, respiratory rate. She appears calm, collected, in no acute distress and is able to give a full detailed history of her headaches going  back over the last 2 months. There appears to be in an initial head injury when she fell striking her head on the porcelain of the commode though she states that she was having no headaches before this she has had almost constant headache since. She has had minimal relief with the medications at home thus I will try a Depakote infusion to try to break his headache. I doubt other sources of headache such as meningitis, sinusitis, tumor.    Patient states she has near complete resolution of her pain after Depakote infusion. Amenable to discharge with followup.  Vida Roller, MD 04/30/11 [phone]

## 2011-04-30 NOTE — ED Notes (Signed)
Pt recently admitted to [facility] for same c/o migraine ha, d/c on Tuesday, pt reports she has had no relief at home and is now wanting eval here

## 2011-04-30 NOTE — ED Notes (Signed)
C/o migraine "every day since Sept 1."  Experiencing n/dizziness/light and sound sensitivity.  Also c/o bil ear pain.

## 2011-04-30 NOTE — ED Provider Notes (Signed)
History     CSN: [id number] Arrival date & time: 04/30/2011  7:10 PM    Chief Complaint  Patient presents with  . Migraine    HPI Pt was seen at 2000.  Per pt, c/o gradual onset and persistence of constant acute flair of her chronic migraine headache that began today PTA.  Headache has been located in frontal area, has been assoc with nausea, describes as "throbbing."  Pt has been eval by her PMD and in this ED multiple times for same over the past 2 months.  Pt endorses she was recently admitted to [facility] for same headache, tx with morphine with relief.  Pt most recent ED eval for same was this morning.  States her headache "goes away and comes back again."  States she has been compliant with her multiple meds.  Denies any change in her usual chronic migraine headache pain pattern today, no new injury to head/neck, no fevers, no vomiting/diarrhea, no visual changes, no focal motor weakness, no tingling/numbness in extremities, no ataxia, no neck or back pain, no rash.   Past Medical History  Diagnosis Date  . Asthma   . Bipolar 1 disorder   . Fibromyalgia   . Kidney stones   . Diverticulosis   . Arthritis   . S/P colonoscopy     Dr. Karilyn Cota 2010: few small diverticula at sigmoid. otherwise normal.   . Dysrhythmia     sts "I have heart palpitations"  . Shortness of breath   . Meniere's disease   . Headache     Past Surgical History  Procedure Date  . Tonsillectomy and adenoidectomy   . Umbilical hernia repair Dec 2011    Dr. Lovell Sheehan  . Abdominal hysterectomy   . Hemorrhoid surgery   . Cholecystectomy   . Foot surgery   . Left ovarian removal   . Cesarean section   . Right ovarian removal   . Exploratory laparoscopy   . Wisdom teeth removal   . Multiple hernia repairs   . Tubes in ears   . Tonsillectomy   . Laparoscopy 02/19/2011    Procedure: LAPAROSCOPY DIAGNOSTIC;  Surgeon: Dalia Heading;  Location: [facility];  Service: General;  Laterality: N/A;  .  Laparoscopic appendectomy 02/19/2011    Procedure: APPENDECTOMY LAPAROSCOPIC;  Surgeon: Dalia Heading;  Location: [facility];  Service: General;  Laterality: N/A;  . Appendectomy     Family History  Problem Relation Age of Onset  . Thyroid disease Mother   . Colon cancer      paternal and maternal grandfather  . Anesthesia problems Neg Hx   . Hypotension Neg Hx   . Malignant hyperthermia Neg Hx   . Pseudochol deficiency Neg Hx   . Meniere's disease Other     History  Substance Use Topics  . Smoking status: Current Everyday Smoker -- 0.5 packs/day for 20 years    Types: Cigarettes  . Smokeless tobacco: Never Used  . Alcohol Use: No     not since June 2012    OB History    Grav Para Term Preterm Abortions TAB SAB Ect Mult Living   5 2 2  3  2   2       Review of Systems ROS: Statement: All systems negative except as marked or noted in the HPI; Constitutional: Negative for fever and chills. ; ; Eyes: Negative for eye pain, redness and discharge. ; ; ENMT: Negative for ear pain, hoarseness, nasal congestion, sinus pressure and  sore throat. ; ; Cardiovascular: Negative for chest pain, palpitations, diaphoresis, dyspnea and peripheral edema. ; ; Respiratory: Negative for cough, wheezing and stridor. ; ; Gastrointestinal: +nausea. Negative for vomiting, diarrhea and abdominal pain, blood in stool, hematemesis, jaundice and rectal bleeding. . ; ; Genitourinary: Negative for dysuria, flank pain and hematuria. ; ; Musculoskeletal: Negative for back pain and neck pain. Negative for swelling and trauma.; ; Skin: Negative for pruritus, rash, abrasions, blisters, bruising and skin lesion.; ; Neuro: +headache.  Negative for lightheadedness and neck stiffness. Negative for weakness, altered level of consciousness , altered mental status, extremity weakness, paresthesias, involuntary movement, seizure and syncope.     Allergies  Amoxicillin  Home Medications   Current Outpatient Rx  Name Route  Sig Dispense Refill  . ALBUTEROL 90 MCG/ACT IN AERS Inhalation Inhale 2 puffs into the lungs every 6 (six) hours as needed. For shortness of breath    . CYCLOBENZAPRINE HCL 10 MG PO TABS Oral Take 10 mg by mouth 2 (two) times daily as needed. For muscle spasms     . DIAZEPAM 10 MG PO TABS Oral Take 10 mg by mouth 4 (four) times daily - after meals and at bedtime. Takes three times daily as needed and every night    . DIVALPROEX SODIUM 250 MG PO TB24 Oral Take 1 tablet (250 mg total) by mouth 2 (two) times daily. 60 tablet 0  . DULOXETINE HCL 30 MG PO CPEP Oral Take 30 mg by mouth 2 (two) times daily.      Marland Kitchen ESTROGENS CONJUGATED 0.625 MG PO TABS Oral Take 0.625 mg by mouth daily.     Marland Kitchen GABAPENTIN 600 MG PO TABS Oral Take 600 mg by mouth 4 (four) times daily.      Marland Kitchen MECLIZINE HCL 25 MG PO TABS Oral Take 25 mg by mouth 3 (three) times daily as needed. For vertigo     . OMEPRAZOLE 20 MG PO CPDR Oral Take 20 mg by mouth daily.      . ALBUTEROL SULFATE (2.5 MG/3ML) 0.083% IN NEBU Nebulization Take 2.5 mg by nebulization every 6 (six) hours as needed. For wheezing, allergies, and/or shortness of breath    . CLONAZEPAM 1 MG PO TABS Oral Take 1 mg by mouth 2 (two) times daily.      Marland Kitchen HYDROCODONE-ACETAMINOPHEN 5-325 MG PO TABS  1 - 2 tablets po q4hrs prn pain 50 tablet 0  . MUPIROCIN CALCIUM 2 % NA OINT Nasal Place into the nose 2 (two) times daily. Use one-half of tube in each nostril twice daily for five (5) days. After application, press sides of nose together and gently massage.     . OXYCODONE-ACETAMINOPHEN 7.5-325 MG PO TABS Oral Take 1 tablet by mouth every 4 (four) hours as needed. For pain       BP 112/81  Pulse 106  Temp(Src) 97.8 F (36.6 C) (Oral)  Resp 16  Ht 5\' 6"  (1.676 m)  Wt 200 lb (90.719 kg)  BMI 32.28 kg/m2  SpO2 100%  Physical Exam 2005: Physical examination:  Nursing notes reviewed; Vital signs and O2 SAT reviewed;  Constitutional: Well developed, Well nourished, Well  hydrated, In no acute distress; Head:  Normocephalic, atraumatic; Eyes: EOMI, PERRL, No scleral icterus; ENMT: TM's clear bilat.  Mouth and pharynx normal, Mucous membranes moist; Neck: Supple, Full range of motion, No lymphadenopathy, no meningeal signs; Cardiovascular: Regular rate and rhythm, No murmur, rub, or gallop; Respiratory: Breath sounds clear & equal bilaterally, No  rales, rhonchi, wheezes, or rub, Normal respiratory effort/excursion; Chest: Nontender, Movement normal; Abdomen: Soft, Nontender, Nondistended, Normal bowel sounds; Extremities: Pulses normal, No tenderness, No edema, No calf edema or asymmetry.; Neuro: AA&Ox3, Major CN grossly intact. No facial droop, speech clear.  No nystagmus.  Strength 5/5 equal bilat UE's and LE's, no drift x4 ext.  No gross focal motor or sensory deficits in extremities.; Skin: Color normal, Warm, Dry, no rash, no petechiae.   ED Course  Procedures   MDM  MDM Reviewed: previous chart, nursing note and vitals Reviewed previous: CT scan    Medications given in ED:  dexamethasone (DECADRON) tablet 10 mg (not administered)  diphenhydrAMINE (BENADRYL) injection 50 mg (50 mg Intramuscular Given 04/30/11 2104)  ketorolac (TORADOL) injection 60 mg (60 mg Intramuscular Given 04/30/11 2101)  metoCLOPramide (REGLAN) 5 MG/ML injection 10 mg (10 mg Intramuscular Given 04/30/11 2058)    9:51 PM:  Pt improved after meds.  Wants to go home now.  VSS, neuro exam unchanged and intact. Pt has had multiple ED visits in the last 2 months (nearly every other day) for same complaint, with 2 visits to this ED today.  States her headache "got better after they gave me morphine in the hospital."  Pt informed that I would not be giving her narcotics, as that is not a literature based recommendation or best practice for treatment of headaches.  Doubt need for re-imaging or further dx testing at this time to check for more insidious cause for headache, ie: dural venous  thrombosis, meningitis, tumor; given pt has had 2 negative CT head exams (most recent CT-H last month in ED), afebrile, and is without acute neurological abnormality on today's exam.  Endorses this headache has been a "near constant" headache since she fell and hit her head against a commode 2 months ago.  Denies any change in her usual chronic headache pain pattern today.  Will d/c home and encourage her to f/u with Neuro and her PMD.  Verb understanding.           Naiomy Watters Allison Quarry, DO 05/01/11 1738

## 2011-04-30 NOTE — ED Notes (Signed)
Pt seen multiply time for ha. Recent admission to [facility] d/c yesterday. Ha returned tonight. Was started on topamax & another med the pharmacy did not have. Pt states some nausea, denies any vomiting.

## 2011-05-01 ENCOUNTER — Emergency Department (HOSPITAL_COMMUNITY)
Admission: EM | Admit: 2011-05-01 | Discharge: 2011-05-01 | Disposition: A | Discharge status: Home / Self Care | Payer: Medicaid Other | Attending: Emergency Medicine | Admitting: Emergency Medicine

## 2011-05-01 ENCOUNTER — Encounter (HOSPITAL_COMMUNITY): Payer: Self-pay | Admitting: Emergency Medicine

## 2011-05-01 DIAGNOSIS — W19XXXA Unspecified fall, initial encounter: Secondary | ICD-10-CM | POA: Insufficient documentation

## 2011-05-01 DIAGNOSIS — Z79899 Other long term (current) drug therapy: Secondary | ICD-10-CM | POA: Insufficient documentation

## 2011-05-01 DIAGNOSIS — F17200 Nicotine dependence, unspecified, uncomplicated: Secondary | ICD-10-CM | POA: Insufficient documentation

## 2011-05-01 DIAGNOSIS — R51 Headache: Secondary | ICD-10-CM

## 2011-05-01 MED ORDER — VALPROATE SODIUM 500 MG/5ML IV SOLN
500.0000 mg | Freq: Once | INTRAVENOUS | Status: DC
  Filled 2011-05-01: qty 5

## 2011-05-01 MED ORDER — DIPHENHYDRAMINE HCL 25 MG PO CAPS
25.0000 mg | ORAL_CAPSULE | Freq: Once | ORAL | Status: AC
  Administered 2011-05-01: 25 mg via ORAL
  Filled 2011-05-01: qty 1

## 2011-05-01 MED ORDER — DIAZEPAM 5 MG PO TABS
5.0000 mg | ORAL_TABLET | Freq: Once | ORAL | Status: AC
  Administered 2011-05-01: 5 mg via ORAL
  Filled 2011-05-01: qty 1

## 2011-05-01 MED ORDER — ONDANSETRON 4 MG PO TBDP
4.0000 mg | ORAL_TABLET | Freq: Once | ORAL | Status: AC
  Administered 2011-05-01: 4 mg via ORAL
  Filled 2011-05-01: qty 1

## 2011-05-01 MED ORDER — PROMETHAZINE HCL 25 MG/ML IJ SOLN
25.0000 mg | Freq: Once | INTRAMUSCULAR | Status: AC
  Administered 2011-05-01: 25 mg via INTRAMUSCULAR
  Filled 2011-05-01: qty 1

## 2011-05-01 NOTE — ED Provider Notes (Signed)
History     CSN: [id number] Arrival date & time: 05/01/2011  4:01 PM   First MD Initiated Contact with Patient 05/01/11 1654      Chief Complaint  Patient presents with  . Fall  . Migraine    (Consider location/radiation/quality/duration/timing/severity/associated sxs/prior treatment) The history is provided by the patient.   patient has had a headache for the last few months. It started out as vertigo and had been diagnosed with Mnire's disease. She's since had multiple issues with headaches dizziness and passing out she's had 8 visits the ER in the last 2 months. He's also been seen at [facility] in her primary care doctor. No clear cause but found. She states that the day she had thought she passed out. She states that she did stop her head. She continues to have a headache. No numbness or weakness. No other pains.  Past Medical History  Diagnosis Date  . Asthma   . Bipolar 1 disorder   . Fibromyalgia   . Kidney stones   . Diverticulosis   . Arthritis   . S/P colonoscopy     Dr. Karilyn Cota 2010: few small diverticula at sigmoid. otherwise normal.   . Dysrhythmia     sts "I have heart palpitations"  . Shortness of breath   . Meniere's disease   . Headache     Past Surgical History  Procedure Date  . Tonsillectomy and adenoidectomy   . Umbilical hernia repair Dec 2011    Dr. Lovell Sheehan  . Abdominal hysterectomy   . Hemorrhoid surgery   . Cholecystectomy   . Foot surgery   . Left ovarian removal   . Cesarean section   . Right ovarian removal   . Exploratory laparoscopy   . Wisdom teeth removal   . Multiple hernia repairs   . Tubes in ears   . Tonsillectomy   . Laparoscopy 02/19/2011    Procedure: LAPAROSCOPY DIAGNOSTIC;  Surgeon: Dalia Heading;  Location: [facility];  Service: General;  Laterality: N/A;  . Laparoscopic appendectomy 02/19/2011    Procedure: APPENDECTOMY LAPAROSCOPIC;  Surgeon: Dalia Heading;  Location: [facility];  Service: General;  Laterality: N/A;  .  Appendectomy     Family History  Problem Relation Age of Onset  . Thyroid disease Mother   . Colon cancer      paternal and maternal grandfather  . Anesthesia problems Neg Hx   . Hypotension Neg Hx   . Malignant hyperthermia Neg Hx   . Pseudochol deficiency Neg Hx   . Meniere's disease Other     History  Substance Use Topics  . Smoking status: Current Everyday Smoker -- 0.5 packs/day for 20 years    Types: Cigarettes  . Smokeless tobacco: Never Used  . Alcohol Use: No     not since June 2012    OB History    Grav Para Term Preterm Abortions TAB SAB Ect Mult Living   5 2 2  3  2   2       Review of Systems  Constitutional: Negative for activity change and appetite change.  HENT: Negative for neck stiffness.   Eyes: Negative for pain.  Respiratory: Negative for chest tightness and shortness of breath.   Cardiovascular: Negative for chest pain and leg swelling.  Gastrointestinal: Negative for nausea, vomiting, abdominal pain and diarrhea.  Genitourinary: Negative for flank pain.  Musculoskeletal: Negative for back pain.  Skin: Negative for rash.  Neurological: Positive for headaches. Negative for weakness and numbness.  Psychiatric/Behavioral: Negative for behavioral problems.    Allergies  Amoxicillin  Home Medications   Current Outpatient Rx  Name Route Sig Dispense Refill  . CYCLOBENZAPRINE HCL 10 MG PO TABS Oral Take 10 mg by mouth 2 (two) times daily as needed. For muscle spasms     . DIAZEPAM 10 MG PO TABS Oral Take 10 mg by mouth 4 (four) times daily - after meals and at bedtime. Takes three times daily as needed and every night    . DIVALPROEX SODIUM 250 MG PO TB24 Oral Take 1 tablet (250 mg total) by mouth 2 (two) times daily. 60 tablet 0  . DOXEPIN HCL 25 MG PO CAPS Oral Take 25 mg by mouth at bedtime.      . DULOXETINE HCL 30 MG PO CPEP Oral Take 30 mg by mouth 2 (two) times daily.      Marland Kitchen ESTROGENS CONJUGATED 0.625 MG PO TABS Oral Take 0.625 mg by mouth  daily.     Marland Kitchen GABAPENTIN 600 MG PO TABS Oral Take 600 mg by mouth 4 (four) times daily.      Marland Kitchen MECLIZINE HCL 25 MG PO TABS Oral Take 25 mg by mouth 3 (three) times daily as needed. For vertigo     . OMEPRAZOLE 20 MG PO CPDR Oral Take 20 mg by mouth daily.      Marland Kitchen ZOFRAN ODT PO Oral Take 1 tablet by mouth as needed. For nausea     . TOPIRAMATE 25 MG PO TABS Oral Take 25 mg by mouth 2 (two) times daily.      . ALBUTEROL SULFATE (2.5 MG/3ML) 0.083% IN NEBU Nebulization Take 2.5 mg by nebulization every 6 (six) hours as needed. For wheezing, allergies, and/or shortness of breath    . ALBUTEROL 90 MCG/ACT IN AERS Inhalation Inhale 2 puffs into the lungs every 6 (six) hours as needed. For shortness of breath    . MUPIROCIN CALCIUM 2 % NA OINT Nasal Place into the nose 2 (two) times daily. Use one-half of tube in each nostril twice daily for five (5) days. After application, press sides of nose together and gently massage.     Marland Kitchen PROMETHAZINE HCL 25 MG PO TABS Oral Take 25 mg by mouth as needed. For nausea       BP 122/77  Pulse 81  Temp(Src) 98.5 F (36.9 C) (Oral)  Resp 18  SpO2 98%  Physical Exam  Nursing note and vitals reviewed. Constitutional: She is oriented to person, place, and time. She appears well-developed and well-nourished.  HENT:  Head: Normocephalic and atraumatic.  Eyes: EOM are normal. Pupils are equal, round, and reactive to light.  Neck: Normal range of motion. Neck supple.  Cardiovascular: Normal rate, regular rhythm and normal heart sounds.   No murmur heard. Pulmonary/Chest: Effort normal and breath sounds normal. No respiratory distress. She has no wheezes. She has no rales.  Abdominal: Soft. Bowel sounds are normal. She exhibits no distension. There is no tenderness. There is no rebound and no guarding.  Musculoskeletal: Normal range of motion.  Neurological: She is alert and oriented to person, place, and time. No cranial nerve deficit.  Skin: Skin is warm and dry.    Psychiatric: She has a normal mood and affect. Her speech is normal.    ED Course  Procedures (including critical care time)  Labs Reviewed - No data to display No results found.   1. Headache       MDM  Headache for  last 2 months. Mammogram started after she got her head. She's been seen here 9 times in the ED here in the last 2 months. She's also been [place]. No clear cause was found on the CT. She has neurology appointment. He said his headache, will be discharged home. She's not had an MRI yet, and likely will need one as an outpatient.        Juliet Rude. Rubin Payor, MD 05/01/11 [phone]

## 2011-05-01 NOTE — ED Notes (Signed)
Pt states she got dizzy and fell and was not using walker. Pt fell and hit head. No witness. Denies LOC. Pt c/o headache from fall to right top area. Alert/oriented. Nad. Denies pain elsewhere

## 2011-05-01 NOTE — ED Notes (Signed)
Patient states that she is suppose to have an IV put in and that she needs something for Nausea and for her nerves.

## 2011-05-01 NOTE — ED Notes (Signed)
Pt resting quietly, asking for discharge paperwork.

## 2011-05-02 ENCOUNTER — Encounter (HOSPITAL_COMMUNITY): Payer: Self-pay

## 2011-05-02 ENCOUNTER — Emergency Department (HOSPITAL_COMMUNITY)
Admission: EM | Admit: 2011-05-02 | Discharge: 2011-05-02 | Disposition: A | Discharge status: Home / Self Care | Payer: Medicaid Other | Attending: Emergency Medicine | Admitting: Emergency Medicine

## 2011-05-02 DIAGNOSIS — IMO0001 Reserved for inherently not codable concepts without codable children: Secondary | ICD-10-CM | POA: Insufficient documentation

## 2011-05-02 DIAGNOSIS — Z79899 Other long term (current) drug therapy: Secondary | ICD-10-CM | POA: Insufficient documentation

## 2011-05-02 DIAGNOSIS — R51 Headache: Secondary | ICD-10-CM | POA: Insufficient documentation

## 2011-05-02 DIAGNOSIS — F319 Bipolar disorder, unspecified: Secondary | ICD-10-CM | POA: Insufficient documentation

## 2011-05-02 DIAGNOSIS — M129 Arthropathy, unspecified: Secondary | ICD-10-CM | POA: Insufficient documentation

## 2011-05-02 DIAGNOSIS — F17200 Nicotine dependence, unspecified, uncomplicated: Secondary | ICD-10-CM | POA: Insufficient documentation

## 2011-05-02 DIAGNOSIS — J45909 Unspecified asthma, uncomplicated: Secondary | ICD-10-CM | POA: Insufficient documentation

## 2011-05-02 DIAGNOSIS — Z87442 Personal history of urinary calculi: Secondary | ICD-10-CM | POA: Insufficient documentation

## 2011-05-02 DIAGNOSIS — R42 Dizziness and giddiness: Secondary | ICD-10-CM

## 2011-05-02 NOTE — ED Notes (Signed)
Dr. Bebe Shaggy in prior to RN, see EDP assessment for further

## 2011-05-02 NOTE — ED Notes (Signed)
Pt drowsy when aroused, friend here to take pt home

## 2011-05-02 NOTE — ED Notes (Signed)
Pt presents with migraine and dizziness. Pt has been seen here multiple times for same symptoms and states her PMD is waiting for her neurologist to call her. Pt a/ox4. Resp even and unlabored. Pt to triage via w/c.

## 2011-05-02 NOTE — ED Provider Notes (Signed)
History   This chart was scribed for Joya Gaskins, MD by Clarita Crane. The patient was seen in room APFT21/APFT21 and the patient's care was started at 6:47PM.   CSN: [id number] Arrival date & time: 05/02/2011  6:27 PM   First MD Initiated Contact with Patient 05/02/11 1829      Chief Complaint  Patient presents with  . Migraine  . Dizziness    (Consider location/radiation/quality/duration/timing/severity/associated sxs/prior treatment) HPI Allison Bolton is a 40 y.o. female who presents to the Emergency Department complaining of constant moderate HA onset several weeks ago and persistent since with associated blurred vision, dizziness described as the room spinning, weakness of bilateral lower extremities and tinnitus. Also notes that has had several falls over the past several weeks with the most recent occuring yesterday. Denies fever, vomiting, ear drainage, chest pain. Patient has been evaluated several times within the past several weeks in the ED for same complaint and notes she has not been evaluated by PCP-Dr. Robynn Pane regarding symptoms. Reports she recently had a CT-Head performed as ordered by Dr. Robynn Pane though with normal results. Patient with h/o meniere's disease, bipolar 1 disorder, diverticulosis.  PCP- Dr. Robynn Pane  Past Medical History  Diagnosis Date  . Asthma   . Bipolar 1 disorder   . Fibromyalgia   . Kidney stones   . Diverticulosis   . Arthritis   . S/P colonoscopy     Dr. Karilyn Cota 2010: few small diverticula at sigmoid. otherwise normal.   . Dysrhythmia     sts "I have heart palpitations"  . Shortness of breath   . Meniere's disease   . Headache     Past Surgical History  Procedure Date  . Tonsillectomy and adenoidectomy   . Umbilical hernia repair Dec 2011    Dr. Lovell Sheehan  . Abdominal hysterectomy   . Hemorrhoid surgery   . Cholecystectomy   . Foot surgery   . Left ovarian removal   . Cesarean section   . Right ovarian removal   .  Exploratory laparoscopy   . Wisdom teeth removal   . Multiple hernia repairs   . Tubes in ears   . Tonsillectomy   . Laparoscopy 02/19/2011    Procedure: LAPAROSCOPY DIAGNOSTIC;  Surgeon: Dalia Heading;  Location: [facility];  Service: General;  Laterality: N/A;  . Laparoscopic appendectomy 02/19/2011    Procedure: APPENDECTOMY LAPAROSCOPIC;  Surgeon: Dalia Heading;  Location: [facility];  Service: General;  Laterality: N/A;  . Appendectomy     Family History  Problem Relation Age of Onset  . Thyroid disease Mother   . Colon cancer      paternal and maternal grandfather  . Anesthesia problems Neg Hx   . Hypotension Neg Hx   . Malignant hyperthermia Neg Hx   . Pseudochol deficiency Neg Hx   . Meniere's disease Other     History  Substance Use Topics  . Smoking status: Current Everyday Smoker -- 0.5 packs/day for 20 years    Types: Cigarettes  . Smokeless tobacco: Never Used  . Alcohol Use: No     not since June 2012    OB History    Grav Para Term Preterm Abortions TAB SAB Ect Mult Living   5 2 2  3  2   2       Review of Systems 10 Systems reviewed and are negative for acute change except as noted in the HPI.  Allergies  Amoxicillin  Home Medications   Current  Outpatient Rx  Name Route Sig Dispense Refill  . CYCLOBENZAPRINE HCL 10 MG PO TABS Oral Take 10 mg by mouth 2 (two) times daily as needed. For muscle spasms     . DIAZEPAM 10 MG PO TABS Oral Take 10 mg by mouth 4 (four) times daily - after meals and at bedtime. Takes three times daily as needed and every night    . DIVALPROEX SODIUM 250 MG PO TB24 Oral Take 1 tablet (250 mg total) by mouth 2 (two) times daily. 60 tablet 0  . DOXEPIN HCL 25 MG PO CAPS Oral Take 25 mg by mouth at bedtime.      . DULOXETINE HCL 30 MG PO CPEP Oral Take 30 mg by mouth 2 (two) times daily.      Marland Kitchen ESTROGENS CONJUGATED 0.625 MG PO TABS Oral Take 0.625 mg by mouth daily.     Marland Kitchen GABAPENTIN 600 MG PO TABS Oral Take 600 mg by mouth 4 (four)  times daily.      Marland Kitchen MECLIZINE HCL 25 MG PO TABS Oral Take 25 mg by mouth 3 (three) times daily as needed. For vertigo     . OMEPRAZOLE 20 MG PO CPDR Oral Take 20 mg by mouth daily.      Marland Kitchen PROMETHAZINE HCL 25 MG PO TABS Oral Take 25 mg by mouth as needed. For nausea     . TOPIRAMATE 25 MG PO TABS Oral Take 25 mg by mouth 2 (two) times daily.      . ALBUTEROL SULFATE (2.5 MG/3ML) 0.083% IN NEBU Nebulization Take 2.5 mg by nebulization every 6 (six) hours as needed. For wheezing, allergies, and/or shortness of breath    . ALBUTEROL 90 MCG/ACT IN AERS Inhalation Inhale 2 puffs into the lungs every 6 (six) hours as needed. For shortness of breath    . MUPIROCIN CALCIUM 2 % NA OINT Nasal Place into the nose 2 (two) times daily. Use one-half of tube in each nostril twice daily for five (5) days. After application, press sides of nose together and gently massage.     Marland Kitchen ZOFRAN ODT PO Oral Take 1 tablet by mouth as needed. For nausea       BP 115/87  Pulse 112  Temp(Src) 98.1 F (36.7 C) (Oral)  Resp 20  Ht 5\' 6"  (1.676 m)  Wt 200 lb (90.719 kg)  BMI 32.28 kg/m2  SpO2 97%  Physical Exam CONSTITUTIONAL: Well developed/well nourished HEAD AND FACE: Normocephalic/atraumatic EYES: EOMI/PERRL, fundoscopic exam normal ENMT: Mucous membranes moist, TM's normal bilaterally NECK: supple no meningeal signs, no carotid bruits SPINE: c-spine nontender CV: S1/S2 noted, no murmurs/rubs/gallops noted LUNGS: Lungs are clear to auscultation bilaterally, no apparent distress ABDOMEN: soft, nontender, no rebound or guarding NEURO: Pt is awake/alert, moves all extremitiesx4, Awake/alert, facies symmetric, no arm or leg drift is noted, Cranial nerves 3/4/5/6/01/12/09/11/12 tested and intact, Gait normal, no past pointing EXTREMITIES: pulses normal, full ROM SKIN: warm, color normal PSYCH: no abnormalities of mood noted  ED Course  Procedures (including critical care time)  DIAGNOSTIC STUDIES: Oxygen  Saturation is 97% on room air, normal by my interpretation.    COORDINATION OF CARE:     MDM  Nursing notes reviewed and considered in documentation Previous records reviewed and considered  Pt with multiple ED visits for vertigo/HA symptoms She has no neuro deficits here, ambulatory, steady gait No focal weakness, equal/appropriate strength in bilateral LE I advised her strongly to f/u with PCP and neurologist.  She admits  this been present since last month She was slightly sedated on my initial eval, likely due to home meds, she had ride home    I personally performed the services described in this documentation, which was scribed in my presence. The recorded information has been reviewed and considered.     Joya Gaskins, MD 05/02/11 1949

## 2011-05-17 ENCOUNTER — Emergency Department (HOSPITAL_COMMUNITY)
Admission: EM | Admit: 2011-05-17 | Discharge: 2011-05-17 | Disposition: A | Discharge status: Home / Self Care | Payer: Medicaid Other | Attending: Emergency Medicine | Admitting: Emergency Medicine

## 2011-05-17 ENCOUNTER — Encounter (HOSPITAL_COMMUNITY): Payer: Self-pay | Admitting: *Deleted

## 2011-05-17 DIAGNOSIS — G43909 Migraine, unspecified, not intractable, without status migrainosus: Secondary | ICD-10-CM | POA: Insufficient documentation

## 2011-05-17 DIAGNOSIS — J45909 Unspecified asthma, uncomplicated: Secondary | ICD-10-CM | POA: Insufficient documentation

## 2011-05-17 DIAGNOSIS — Z79899 Other long term (current) drug therapy: Secondary | ICD-10-CM | POA: Insufficient documentation

## 2011-05-17 DIAGNOSIS — Z9889 Other specified postprocedural states: Secondary | ICD-10-CM | POA: Insufficient documentation

## 2011-05-17 DIAGNOSIS — M129 Arthropathy, unspecified: Secondary | ICD-10-CM | POA: Insufficient documentation

## 2011-05-17 DIAGNOSIS — IMO0001 Reserved for inherently not codable concepts without codable children: Secondary | ICD-10-CM | POA: Insufficient documentation

## 2011-05-17 DIAGNOSIS — R42 Dizziness and giddiness: Secondary | ICD-10-CM | POA: Insufficient documentation

## 2011-05-17 DIAGNOSIS — F319 Bipolar disorder, unspecified: Secondary | ICD-10-CM | POA: Insufficient documentation

## 2011-05-17 DIAGNOSIS — F17200 Nicotine dependence, unspecified, uncomplicated: Secondary | ICD-10-CM | POA: Insufficient documentation

## 2011-05-17 MED ORDER — KETOROLAC TROMETHAMINE 30 MG/ML IJ SOLN
30.0000 mg | Freq: Once | INTRAMUSCULAR | Status: AC
  Administered 2011-05-17: 30 mg via INTRAVENOUS
  Filled 2011-05-17: qty 1

## 2011-05-17 MED ORDER — SODIUM CHLORIDE 0.9 % IV SOLN
INTRAVENOUS | Status: DC
  Administered 2011-05-17: 20:00:00 via INTRAVENOUS

## 2011-05-17 MED ORDER — DIPHENHYDRAMINE HCL 50 MG/ML IJ SOLN
25.0000 mg | Freq: Once | INTRAMUSCULAR | Status: AC
  Administered 2011-05-17: 25 mg via INTRAVENOUS
  Filled 2011-05-17: qty 1

## 2011-05-17 MED ORDER — METOCLOPRAMIDE HCL 5 MG/ML IJ SOLN
10.0000 mg | Freq: Once | INTRAMUSCULAR | Status: AC
  Administered 2011-05-17: 10 mg via INTRAVENOUS
  Filled 2011-05-17: qty 2

## 2011-05-17 NOTE — ED Provider Notes (Signed)
History     CSN: [id number] Arrival date & time: 05/17/2011  6:23 PM   First MD Initiated Contact with Patient 05/17/11 1828      Chief Complaint  Patient presents with  . Migraine    (Consider location/radiation/quality/duration/timing/severity/associated sxs/prior treatment) HPI Patient has been complaining of migraine headache and dizziness for the last 4 days. Patient states she has history of chronic migraine headaches that have required frequent visits to the emergency department. She is supposed to be seeing a neurologist but the appointment as later this month. The patient has been having syncopal episodes at times and was evaluated [facility] for that last week. Patient did not have any syncopal episodes today. There's been no vomiting or diarrhea. There's been no fever. She has not had any recent head injury. The headache is her typical migraine it is exacerbated by the light. The severity is moderate to severe. Her usual medications at home have not been helping. Past Medical History  Diagnosis Date  . Asthma   . Bipolar 1 disorder   . Fibromyalgia   . Kidney stones   . Diverticulosis   . Arthritis   . S/P colonoscopy     Dr. Karilyn Cota 2010: few small diverticula at sigmoid. otherwise normal.   . Dysrhythmia     sts "I have heart palpitations"  . Shortness of breath   . Meniere's disease   . Headache     Past Surgical History  Procedure Date  . Tonsillectomy and adenoidectomy   . Umbilical hernia repair Dec 2011    Dr. Lovell Sheehan  . Abdominal hysterectomy   . Hemorrhoid surgery   . Cholecystectomy   . Foot surgery   . Left ovarian removal   . Cesarean section   . Right ovarian removal   . Exploratory laparoscopy   . Wisdom teeth removal   . Multiple hernia repairs   . Tubes in ears   . Tonsillectomy   . Laparoscopy 02/19/2011    Procedure: LAPAROSCOPY DIAGNOSTIC;  Surgeon: Dalia Heading;  Location: [facility];  Service: General;  Laterality: N/A;  .  Laparoscopic appendectomy 02/19/2011    Procedure: APPENDECTOMY LAPAROSCOPIC;  Surgeon: Dalia Heading;  Location: [facility];  Service: General;  Laterality: N/A;  . Appendectomy     Family History  Problem Relation Age of Onset  . Thyroid disease Mother   . Colon cancer      paternal and maternal grandfather  . Anesthesia problems Neg Hx   . Hypotension Neg Hx   . Malignant hyperthermia Neg Hx   . Pseudochol deficiency Neg Hx   . Meniere's disease Other     History  Substance Use Topics  . Smoking status: Current Everyday Smoker -- 0.5 packs/day for 20 years    Types: Cigarettes  . Smokeless tobacco: Never Used  . Alcohol Use: No     not since June 2012    OB History    Grav Para Term Preterm Abortions TAB SAB Ect Mult Living   5 2 2  3  2   2       Review of Systems  All other systems reviewed and are negative.    Allergies  Amoxicillin  Home Medications   Current Outpatient Rx  Name Route Sig Dispense Refill  . ALBUTEROL SULFATE (2.5 MG/3ML) 0.083% IN NEBU Nebulization Take 2.5 mg by nebulization every 6 (six) hours as needed. For wheezing, allergies, and/or shortness of breath    . ALBUTEROL 90 MCG/ACT IN  AERS Inhalation Inhale 2 puffs into the lungs every 6 (six) hours as needed. For shortness of breath    . CYCLOBENZAPRINE HCL 10 MG PO TABS Oral Take 10 mg by mouth 2 (two) times daily as needed. For muscle spasms     . DIAZEPAM 10 MG PO TABS Oral Take 10 mg by mouth 4 (four) times daily - after meals and at bedtime. Takes three times daily as needed and every night    . DIVALPROEX SODIUM 250 MG PO TB24 Oral Take 1 tablet (250 mg total) by mouth 2 (two) times daily. 60 tablet 0  . DOXEPIN HCL 25 MG PO CAPS Oral Take 25 mg by mouth at bedtime.      . DULOXETINE HCL 30 MG PO CPEP Oral Take 30 mg by mouth 2 (two) times daily.      Marland Kitchen ESTROGENS CONJUGATED 0.625 MG PO TABS Oral Take 0.625 mg by mouth daily.     Marland Kitchen GABAPENTIN 600 MG PO TABS Oral Take 600 mg by mouth 4  (four) times daily.      Marland Kitchen MECLIZINE HCL 25 MG PO TABS Oral Take 25 mg by mouth 3 (three) times daily as needed. For vertigo     . MUPIROCIN CALCIUM 2 % NA OINT Nasal Place into the nose 2 (two) times daily. Use one-half of tube in each nostril twice daily for five (5) days. After application, press sides of nose together and gently massage.     Marland Kitchen OMEPRAZOLE 20 MG PO CPDR Oral Take 20 mg by mouth daily.      Marland Kitchen ZOFRAN ODT PO Oral Take 1 tablet by mouth as needed. For nausea     . PROMETHAZINE HCL 25 MG PO TABS Oral Take 25 mg by mouth as needed. For nausea     . TOPIRAMATE 25 MG PO TABS Oral Take 25 mg by mouth 2 (two) times daily.        BP 113/73  Pulse 94  Temp(Src) 97.7 F (36.5 C) (Oral)  Resp 16  Ht 5\' 6"  (1.676 m)  Wt 200 lb (90.719 kg)  BMI 32.28 kg/m2  SpO2 98%  Physical Exam  Nursing note and vitals reviewed. Constitutional: She appears well-developed and well-nourished. No distress.  HENT:  Head: Normocephalic and atraumatic.  Right Ear: External ear normal.  Left Ear: External ear normal.  Eyes: Conjunctivae are normal. Right eye exhibits no discharge. Left eye exhibits no discharge. No scleral icterus.  Neck: Neck supple. No tracheal deviation present.  Cardiovascular: Normal rate, regular rhythm and intact distal pulses.   Pulmonary/Chest: Effort normal and breath sounds normal. No stridor. No respiratory distress. She has no wheezes. She has no rales.  Abdominal: Soft. Bowel sounds are normal. She exhibits no distension. There is no tenderness. There is no rebound and no guarding.  Musculoskeletal: She exhibits no edema and no tenderness.  Neurological: She is alert. She has normal strength. She displays no tremor. No sensory deficit. Cranial nerve deficit:  no gross defecits noted. She exhibits normal muscle tone. She displays no seizure activity. Coordination normal. GCS eye subscore is 4. GCS verbal subscore is 5. GCS motor subscore is 6.  Skin: Skin is warm and dry.  No rash noted.  Psychiatric: She has a normal mood and affect.    ED Course  Procedures (including critical care time)  Medications  0.9 %  sodium chloride infusion (  Intravenous New Bag 05/17/11 1930)  ketorolac (TORADOL) 30 MG/ML injection 30 mg (  30 mg Intravenous Given 05/17/11 1931)  metoCLOPramide (REGLAN) injection 10 mg (10 mg Intravenous Given 05/17/11 1932)  diphenhydrAMINE (BENADRYL) injection 25 mg (25 mg Intravenous Given 05/17/11 1900)    Labs Reviewed - No data to display No results found.    MDM  Patient treated in the emergency department for her migraine.   Symptoms consistent with her chronic migraine headache. No evidence of any acute neurologic abnormality.  8:10 PM ration is feeling better ready for discharge  Diagnosis #1, migraine headache     Celene Kras, MD 05/17/11 2011

## 2011-05-17 NOTE — ED Notes (Signed)
edp in with pt 

## 2011-05-17 NOTE — ED Notes (Addendum)
Pt c/o migraine headache and dizziness x 4 days. Pt states that she has taken all the concoctions that we told her to do and she is getting no better. Pt also states that she has been passing out a lot. Seen at [facility] last week for passing out.

## 2011-05-18 DIAGNOSIS — H8109 Meniere's disease, unspecified ear: Secondary | ICD-10-CM | POA: Insufficient documentation

## 2011-05-18 DIAGNOSIS — IMO0001 Reserved for inherently not codable concepts without codable children: Secondary | ICD-10-CM | POA: Insufficient documentation

## 2011-05-18 DIAGNOSIS — R1032 Left lower quadrant pain: Secondary | ICD-10-CM | POA: Insufficient documentation

## 2011-05-18 DIAGNOSIS — M129 Arthropathy, unspecified: Secondary | ICD-10-CM | POA: Insufficient documentation

## 2011-05-18 DIAGNOSIS — F17200 Nicotine dependence, unspecified, uncomplicated: Secondary | ICD-10-CM | POA: Insufficient documentation

## 2011-05-18 DIAGNOSIS — Z87442 Personal history of urinary calculi: Secondary | ICD-10-CM | POA: Insufficient documentation

## 2011-05-18 DIAGNOSIS — J45909 Unspecified asthma, uncomplicated: Secondary | ICD-10-CM | POA: Insufficient documentation

## 2011-05-18 DIAGNOSIS — R1031 Right lower quadrant pain: Secondary | ICD-10-CM | POA: Insufficient documentation

## 2011-05-18 DIAGNOSIS — F319 Bipolar disorder, unspecified: Secondary | ICD-10-CM | POA: Insufficient documentation

## 2011-05-18 DIAGNOSIS — Z9079 Acquired absence of other genital organ(s): Secondary | ICD-10-CM | POA: Insufficient documentation

## 2011-05-18 DIAGNOSIS — K5730 Diverticulosis of large intestine without perforation or abscess without bleeding: Secondary | ICD-10-CM | POA: Insufficient documentation

## 2011-05-19 ENCOUNTER — Encounter (HOSPITAL_COMMUNITY): Payer: Self-pay | Admitting: *Deleted

## 2011-05-19 ENCOUNTER — Emergency Department (HOSPITAL_COMMUNITY)
Admission: EM | Admit: 2011-05-19 | Discharge: 2011-05-19 | Disposition: A | Discharge status: Home / Self Care | Payer: Medicaid Other | Attending: Emergency Medicine | Admitting: Emergency Medicine

## 2011-05-19 ENCOUNTER — Emergency Department (HOSPITAL_COMMUNITY): Payer: Medicaid Other

## 2011-05-19 LAB — COMPREHENSIVE METABOLIC PANEL
Albumin: 3.4 g/dL — ABNORMAL LOW (ref 3.5–5.2)
Alkaline Phosphatase: 66 U/L (ref 39–117)
BUN: 12 mg/dL (ref 6–23)
Chloride: 106 mEq/L (ref 96–112)
Creatinine, Ser: 0.62 mg/dL (ref 0.50–1.10)
GFR calc Af Amer: >90 mL/min (ref >90)
Glucose, Bld: 99 mg/dL (ref 70–99)
Potassium: 3.8 mEq/L (ref 3.5–5.1)
Total Bilirubin: 0.2 mg/dL — ABNORMAL LOW (ref 0.3–1.2)

## 2011-05-19 LAB — CBC
HCT: 36.3 % (ref 36.0–46.0)
Hemoglobin: 12.1 g/dL (ref 12.0–15.0)
MCH: 28.9 pg (ref 26.0–34.0)
MCHC: 33.3 g/dL (ref 30.0–36.0)
RBC: 4.18 MIL/uL (ref 3.87–5.11)

## 2011-05-19 LAB — DIFFERENTIAL
Basophils Relative: 0 % (ref 0–1)
Eosinophils Absolute: 0.2 10*3/uL (ref 0.0–0.7)
Lymphs Abs: 3.5 10*3/uL (ref 0.7–4.0)
Monocytes Absolute: 0.7 10*3/uL (ref 0.1–1.0)
Monocytes Relative: 8 % (ref 3–12)
Neutrophils Relative %: 53 % (ref 43–77)

## 2011-05-19 LAB — OCCULT BLOOD, POC DEVICE: Fecal Occult Bld: NEGATIVE

## 2011-05-19 LAB — LIPASE, BLOOD: Lipase: 29 U/L (ref 11–59)

## 2011-05-19 MED ORDER — SODIUM CHLORIDE 0.9 % IV SOLN
Freq: Once | INTRAVENOUS | Status: AC
  Administered 2011-05-19: 02:00:00 via INTRAVENOUS

## 2011-05-19 MED ORDER — IOHEXOL 300 MG/ML  SOLN
100.0000 mL | Freq: Once | INTRAMUSCULAR | Status: AC | PRN
  Administered 2011-05-19: 100 mL via INTRAVENOUS

## 2011-05-19 MED ORDER — ONDANSETRON HCL 4 MG/2ML IJ SOLN
4.0000 mg | Freq: Once | INTRAMUSCULAR | Status: AC
  Administered 2011-05-19: 4 mg via INTRAVENOUS
  Filled 2011-05-19: qty 2

## 2011-05-19 MED ORDER — HYDROMORPHONE HCL PF 1 MG/ML IJ SOLN
1.0000 mg | Freq: Once | INTRAMUSCULAR | Status: AC
  Administered 2011-05-19: 1 mg via INTRAVENOUS
  Filled 2011-05-19: qty 1

## 2011-05-19 NOTE — ED Notes (Signed)
Pt reports generalized abd pain starting earlier today, pt reports she noticed dark stools also today

## 2011-05-19 NOTE — ED Notes (Signed)
Patient requesting more pain medication, states her pain is better but still hurts. Advised MD, no orders at this time.

## 2011-05-19 NOTE — ED Provider Notes (Addendum)
History     CSN: [id number] Arrival date & time: 05/19/2011 12:11 AM   First MD Initiated Contact with Patient 05/19/11 0145      Chief Complaint  Patient presents with  . Abdominal Pain    (Consider location/radiation/quality/duration/timing/severity/associated sxs/prior treatment) HPI This 40 year old white female with a history of multiple abdominal surgeries. She complains of generalized abdominal pain that began yesterday about midday. The pain is moderate to severe, worse with palpation particularly the right midabdomen. She denies nausea or vomiting. She thinks her abdomen is somewhat distended. She's had diarrhea and states her stools have been black but she did take Pepto-Bismol recently. She finds it difficult to characterize the pain but states it's different than abdominal pain she's had the past. She relates a remote history of colitis. She states she has chronic dysarthria due to Mnire's disease.  Past Medical History  Diagnosis Date  . Asthma   . Bipolar 1 disorder   . Fibromyalgia   . Kidney stones   . Diverticulosis   . Arthritis   . S/P colonoscopy     Dr. Karilyn Cota 2010: few small diverticula at sigmoid. otherwise normal.   . Dysrhythmia     sts "I have heart palpitations"  . Shortness of breath   . Meniere's disease   . Headache     Past Surgical History  Procedure Date  . Tonsillectomy and adenoidectomy   . Umbilical hernia repair Dec 2011    Dr. Lovell Sheehan  . Abdominal hysterectomy   . Hemorrhoid surgery   . Cholecystectomy   . Foot surgery   . Left ovarian removal   . Cesarean section   . Right ovarian removal   . Exploratory laparoscopy   . Wisdom teeth removal   . Multiple hernia repairs   . Tubes in ears   . Tonsillectomy   . Laparoscopy 02/19/2011    Procedure: LAPAROSCOPY DIAGNOSTIC;  Surgeon: Dalia Heading;  Location: [facility];  Service: General;  Laterality: N/A;  . Laparoscopic appendectomy 02/19/2011    Procedure: APPENDECTOMY  LAPAROSCOPIC;  Surgeon: Dalia Heading;  Location: [facility];  Service: General;  Laterality: N/A;  . Appendectomy     Family History  Problem Relation Age of Onset  . Thyroid disease Mother   . Colon cancer      paternal and maternal grandfather  . Anesthesia problems Neg Hx   . Hypotension Neg Hx   . Malignant hyperthermia Neg Hx   . Pseudochol deficiency Neg Hx   . Meniere's disease Other     History  Substance Use Topics  . Smoking status: Current Everyday Smoker -- 0.5 packs/day for 20 years    Types: Cigarettes  . Smokeless tobacco: Never Used  . Alcohol Use: No     not since June 2012    OB History    Grav Para Term Preterm Abortions TAB SAB Ect Mult Living   5 2 2  3  2   2       Review of Systems  All other systems reviewed and are negative.    Allergies  Amoxicillin  Home Medications   Current Outpatient Rx  Name Route Sig Dispense Refill  . ALBUTEROL SULFATE (2.5 MG/3ML) 0.083% IN NEBU Nebulization Take 2.5 mg by nebulization every 6 (six) hours as needed. For wheezing, allergies, and/or shortness of breath    . ALBUTEROL 90 MCG/ACT IN AERS Inhalation Inhale 2 puffs into the lungs every 6 (six) hours as needed. For shortness of breath    .  CYCLOBENZAPRINE HCL 10 MG PO TABS Oral Take 10 mg by mouth 2 (two) times daily as needed. For muscle spasms     . DIAZEPAM 10 MG PO TABS Oral Take 10 mg by mouth 4 (four) times daily - after meals and at bedtime. Takes three times daily as needed and every night    . DIVALPROEX SODIUM 250 MG PO TB24 Oral Take 1 tablet (250 mg total) by mouth 2 (two) times daily. 60 tablet 0  . DOXEPIN HCL 25 MG PO CAPS Oral Take 25 mg by mouth at bedtime.      . DULOXETINE HCL 30 MG PO CPEP Oral Take 30 mg by mouth 2 (two) times daily.      Marland Kitchen ESTROGENS CONJUGATED 0.625 MG PO TABS Oral Take 0.625 mg by mouth daily.     Marland Kitchen GABAPENTIN 600 MG PO TABS Oral Take 600 mg by mouth 4 (four) times daily.      Marland Kitchen MECLIZINE HCL 25 MG PO TABS Oral Take 25  mg by mouth 3 (three) times daily as needed. For vertigo     . MUPIROCIN CALCIUM 2 % NA OINT Nasal Place into the nose 2 (two) times daily. Use one-half of tube in each nostril twice daily for five (5) days. After application, press sides of nose together and gently massage.     Marland Kitchen OMEPRAZOLE 20 MG PO CPDR Oral Take 20 mg by mouth daily.      Marland Kitchen ZOFRAN ODT PO Oral Take 1 tablet by mouth as needed. For nausea     . PROMETHAZINE HCL 25 MG PO TABS Oral Take 25 mg by mouth as needed. For nausea     . TOPIRAMATE 25 MG PO TABS Oral Take 25 mg by mouth 2 (two) times daily.        BP 115/62  Pulse 75  Temp 97.6 F (36.4 C)  Resp 20  Ht 5\' 6"  (1.676 m)  Wt 215 lb (97.523 kg)  BMI 34.70 kg/m2  SpO2 96%  Physical Exam General: Well-developed, well-nourished female in no acute distress; appearance consistent with age of record HENT: normocephalic, atraumatic Eyes: pupils equal round and reactive to light; extraocular muscles intact Neck: supple Heart: regular rate and rhythmps Lungs: Faint expiratory wheeze in the right base Abdomen: soft; diffusely tender, notably in the right midabdomen; mildly distended; bowel sounds present Rectal: External hemorrhoids; nontender; normal sphincter tone; stool brown heme-negative Extremities: No deformity; full range of motion Neurologic: Awake, alert;motor function intact in all extremities and symmetric; no facial droop; Dysarthria Skin: Warm and dry     ED Course  Procedures (including critical care time)   MDM   Nursing notes and vitals signs, including pulse oximetry, reviewed.  Summary of this visit's results, reviewed by myself:  Labs:  Results for orders placed during the hospital encounter of 05/19/11  CBC      Component Value Range   WBC 9.3  4.0 - 10.5 (K/uL)   RBC 4.18  3.87 - 5.11 (MIL/uL)   Hemoglobin 12.1  12.0 - 15.0 (g/dL)   HCT 16.1  09.6 - 04.5 (%)   MCV 86.8  78.0 - 100.0 (fL)   MCH 28.9  26.0 - 34.0 (pg)   MCHC 33.3   30.0 - 36.0 (g/dL)   RDW 40.9  81.1 - 91.4 (%)   Platelets 264  150 - 400 (K/uL)  DIFFERENTIAL      Component Value Range   Neutrophils Relative 53  43 - 77 (%)  Neutro Abs 4.9  1.7 - 7.7 (K/uL)   Lymphocytes Relative 38  12 - 46 (%)   Lymphs Abs 3.5  0.7 - 4.0 (K/uL)   Monocytes Relative 8  3 - 12 (%)   Monocytes Absolute 0.7  0.1 - 1.0 (K/uL)   Eosinophils Relative 2  0 - 5 (%)   Eosinophils Absolute 0.2  0.0 - 0.7 (K/uL)   Basophils Relative 0  0 - 1 (%)   Basophils Absolute 0.0  0.0 - 0.1 (K/uL)  COMPREHENSIVE METABOLIC PANEL      Component Value Range   Sodium 137  135 - 145 (mEq/L)   Potassium 3.8  3.5 - 5.1 (mEq/L)   Chloride 106  96 - 112 (mEq/L)   CO2 23  19 - 32 (mEq/L)   Glucose, Bld 99  70 - 99 (mg/dL)   BUN 12  6 - 23 (mg/dL)   Creatinine, Ser 4.69  0.50 - 1.10 (mg/dL)   Calcium 9.3  8.4 - 62.9 (mg/dL)   Total Protein 6.7  6.0 - 8.3 (g/dL)   Albumin 3.4 (*) 3.5 - 5.2 (g/dL)   AST 13  0 - 37 (U/L)   ALT 14  0 - 35 (U/L)   Alkaline Phosphatase 66  39 - 117 (U/L)   Total Bilirubin 0.2 (*) 0.3 - 1.2 (mg/dL)   GFR calc non Af Amer >90  >90 (mL/min)   GFR calc Af Amer >90  >90 (mL/min)  LIPASE, BLOOD      Component Value Range   Lipase 29  11 - 59 (U/L)    Imaging Studies: Ct Abdomen Pelvis W Contrast  05/19/2011  *RADIOLOGY REPORT*  Clinical Data: Bilateral lower quadrant abdominal pain and nausea.  CT ABDOMEN AND PELVIS WITH CONTRAST  Technique:  Multidetector CT imaging of the abdomen and pelvis was performed following the standard protocol during bolus administration of intravenous contrast.  Contrast: OMNIPAQUE IOHEXOL 300 MG/ML IV SOLN  Comparison: CT of the abdomen and pelvis performed 03/08/2011, and MRI of the abdomen performed 02/07/2011  Findings: Minimal bibasilar atelectasis is noted.  Two small hypodense lesions are again noted along the left hepatic lobe, measuring 1.4 cm and 1.2 cm in size.  These were thought to reflect simple cysts on the prior  MRI.  The liver is otherwise unremarkable in appearance.  The spleen is within normal limits.  The patient is status post cholecystectomy, with clips noted at the gallbladder fossa.  The common hepatic duct remains normal in caliber, status post cholecystectomy.  The pancreas and adrenal glands are unremarkable in appearance.  The kidneys are unremarkable in appearance.  There is no evidence of hydronephrosis.  No renal or ureteral stones are seen.  No perinephric stranding is appreciated.  No free fluid is identified.  The small bowel is unremarkable in appearance.  The stomach is within normal limits.  No acute vascular abnormalities are seen.  The patient is status post appendectomy, with associated postoperative change.  Scattered diverticulosis is noted along the descending and proximal sigmoid colon, without evidence of diverticulitis.  The colon is otherwise unremarkable in appearance.  The bladder is mildly distended and within normal limits.  The patient is status post hysterectomy.  No suspicious adnexal masses are seen.  No inguinal lymphadenopathy is seen.  No acute osseous abnormalities are identified.  IMPRESSION:  1.  No acute abnormalities identified within the abdomen or pelvis. 2.  Scattered diverticulosis along the descending and proximal sigmoid colon, without evidence  of diverticulitis. 3.  Small hepatic cysts again noted.  Original Report Authenticated By: Tonia Ghent, M.D.    5:34 AM The patient was advised of her essentially negative workup. She acknowledges this and is comfortable being discharged home without narcotics. Her dysarthria is suspicious for unreported drug use as Mnire's disease is not known to cause dysarthria. She states she is anxious to leave at this time as she must meet her home health nurse for her bath.         Hanley Seamen, MD 05/19/11 0535  Hanley Seamen, MD 05/19/11 [phone]

## 2011-05-25 ENCOUNTER — Encounter (HOSPITAL_COMMUNITY): Payer: Self-pay | Admitting: *Deleted

## 2011-05-25 ENCOUNTER — Emergency Department (HOSPITAL_COMMUNITY)
Admission: EM | Admit: 2011-05-25 | Discharge: 2011-05-25 | Disposition: A | Discharge status: Home / Self Care | Payer: Medicaid Other | Attending: Emergency Medicine | Admitting: Emergency Medicine

## 2011-05-25 DIAGNOSIS — M129 Arthropathy, unspecified: Secondary | ICD-10-CM | POA: Insufficient documentation

## 2011-05-25 DIAGNOSIS — Z87442 Personal history of urinary calculi: Secondary | ICD-10-CM | POA: Insufficient documentation

## 2011-05-25 DIAGNOSIS — F17200 Nicotine dependence, unspecified, uncomplicated: Secondary | ICD-10-CM | POA: Insufficient documentation

## 2011-05-25 DIAGNOSIS — IMO0001 Reserved for inherently not codable concepts without codable children: Secondary | ICD-10-CM | POA: Insufficient documentation

## 2011-05-25 DIAGNOSIS — F319 Bipolar disorder, unspecified: Secondary | ICD-10-CM | POA: Insufficient documentation

## 2011-05-25 DIAGNOSIS — J45909 Unspecified asthma, uncomplicated: Secondary | ICD-10-CM | POA: Insufficient documentation

## 2011-05-25 DIAGNOSIS — R51 Headache: Secondary | ICD-10-CM | POA: Insufficient documentation

## 2011-05-25 MED ORDER — SODIUM CHLORIDE 0.9 % IV BOLUS (SEPSIS)
1000.0000 mL | Freq: Once | INTRAVENOUS | Status: AC
  Administered 2011-05-25: 1000 mL via INTRAVENOUS

## 2011-05-25 MED ORDER — DIPHENHYDRAMINE HCL 50 MG/ML IJ SOLN
25.0000 mg | Freq: Once | INTRAMUSCULAR | Status: AC
  Administered 2011-05-25: 25 mg via INTRAVENOUS
  Filled 2011-05-25: qty 1

## 2011-05-25 MED ORDER — METOCLOPRAMIDE HCL 5 MG/ML IJ SOLN
10.0000 mg | Freq: Once | INTRAMUSCULAR | Status: AC
  Administered 2011-05-25: 10 mg via INTRAVENOUS
  Filled 2011-05-25: qty 2

## 2011-05-25 MED ORDER — KETOROLAC TROMETHAMINE 30 MG/ML IJ SOLN
30.0000 mg | Freq: Once | INTRAMUSCULAR | Status: AC
  Administered 2011-05-25: 30 mg via INTRAVENOUS
  Filled 2011-05-25: qty 1

## 2011-05-25 NOTE — ED Notes (Signed)
Pt c/o chronic migraine headache, pt states that she began to experience problems when she was dx with meniere's diease in September, states that she almost always has a migraine headache but the pain is worse now, admits to some nausea,

## 2011-05-25 NOTE — ED Notes (Signed)
Pt states that she tried to use otc migraine medication without much success,

## 2011-05-25 NOTE — ED Provider Notes (Signed)
History     CSN: [id number] Arrival date & time: 05/25/2011  1:19 PM   First MD Initiated Contact with Patient 05/25/11 1333      Chief Complaint  Patient presents with  . Migraine    (Consider location/radiation/quality/duration/timing/severity/associated sxs/prior treatment) HPI Comments: Patient presents with typical migraine headache that started at 5 AM this morning on the right side of her head. It is similar to previous headaches. She's been evaluated in the emergency department multiple times in the past 2 months for similar complaints. She has a self-reported history of Mnire's disease and vertigo as well as fibromyalgia. She states she is scheduled to see a neurologist after Thanksgiving. The headache is associated with photophobia and nausea, her typical vertigo, but no vomiting. She denies any fevers, weakness, numbness or tingling. Denies any vision changes. At home she took, migraine without relief.  The history is provided by the patient.    Past Medical History  Diagnosis Date  . Asthma   . Bipolar 1 disorder   . Fibromyalgia   . Kidney stones   . Diverticulosis   . Arthritis   . S/P colonoscopy     Dr. Karilyn Cota 2010: few small diverticula at sigmoid. otherwise normal.   . Dysrhythmia     sts "I have heart palpitations"  . Shortness of breath   . Meniere's disease   . Headache     Past Surgical History  Procedure Date  . Tonsillectomy and adenoidectomy   . Umbilical hernia repair Dec 2011    Dr. Lovell Sheehan  . Abdominal hysterectomy   . Hemorrhoid surgery   . Cholecystectomy   . Foot surgery   . Left ovarian removal   . Cesarean section   . Right ovarian removal   . Exploratory laparoscopy   . Wisdom teeth removal   . Multiple hernia repairs   . Tubes in ears   . Tonsillectomy   . Laparoscopy 02/19/2011    Procedure: LAPAROSCOPY DIAGNOSTIC;  Surgeon: Dalia Heading;  Location: [facility];  Service: General;  Laterality: N/A;  . Laparoscopic appendectomy  02/19/2011    Procedure: APPENDECTOMY LAPAROSCOPIC;  Surgeon: Dalia Heading;  Location: [facility];  Service: General;  Laterality: N/A;  . Appendectomy     Family History  Problem Relation Age of Onset  . Thyroid disease Mother   . Colon cancer      paternal and maternal grandfather  . Anesthesia problems Neg Hx   . Hypotension Neg Hx   . Malignant hyperthermia Neg Hx   . Pseudochol deficiency Neg Hx   . Meniere's disease Other     History  Substance Use Topics  . Smoking status: Current Everyday Smoker -- 0.5 packs/day for 20 years    Types: Cigarettes  . Smokeless tobacco: Never Used  . Alcohol Use: No     not since June 2012    OB History    Grav Para Term Preterm Abortions TAB SAB Ect Mult Living   5 2 2  3  2   2       Review of Systems  Constitutional: Negative for activity change and appetite change.  HENT: Negative for neck pain and neck stiffness.   Eyes: Positive for photophobia. Negative for visual disturbance.  Respiratory: Negative for cough and shortness of breath.   Cardiovascular: Negative for chest pain.  Gastrointestinal: Positive for nausea. Negative for vomiting and abdominal pain.  Genitourinary: Negative for dysuria.  Neurological: Positive for dizziness and headaches. Negative for  weakness and numbness.    Allergies  Amoxicillin  Home Medications   Current Outpatient Rx  Name Route Sig Dispense Refill  . ALBUTEROL SULFATE (2.5 MG/3ML) 0.083% IN NEBU Nebulization Take 2.5 mg by nebulization every 6 (six) hours as needed. For wheezing, allergies, and/or shortness of breath    . ALBUTEROL 90 MCG/ACT IN AERS Inhalation Inhale 2 puffs into the lungs every 6 (six) hours as needed. For shortness of breath    . CYCLOBENZAPRINE HCL 10 MG PO TABS Oral Take 10 mg by mouth 2 (two) times daily as needed. For muscle spasms     . DIAZEPAM 10 MG PO TABS Oral Take 10 mg by mouth 4 (four) times daily - after meals and at bedtime. Takes three times daily as needed  and every night    . DIVALPROEX SODIUM 250 MG PO TB24 Oral Take 1 tablet (250 mg total) by mouth 2 (two) times daily. 60 tablet 0  . DOXEPIN HCL 25 MG PO CAPS Oral Take 25 mg by mouth at bedtime.      . DULOXETINE HCL 30 MG PO CPEP Oral Take 30 mg by mouth 2 (two) times daily.      Marland Kitchen ESTROGENS CONJUGATED 0.625 MG PO TABS Oral Take 0.625 mg by mouth daily.     Marland Kitchen GABAPENTIN 600 MG PO TABS Oral Take 600 mg by mouth daily.     Marland Kitchen MECLIZINE HCL 25 MG PO TABS Oral Take 25 mg by mouth 3 (three) times daily as needed. For vertigo     . OMEPRAZOLE 20 MG PO CPDR Oral Take 20 mg by mouth daily.      Marland Kitchen ZOFRAN ODT PO Oral Take 1 tablet by mouth as needed. For nausea     . PROMETHAZINE HCL 25 MG PO TABS Oral Take 25 mg by mouth as needed. For nausea     . TOPIRAMATE 25 MG PO TABS Oral Take 25 mg by mouth 2 (two) times daily.        BP 95/48  Pulse 76  Temp(Src) 98.3 F (36.8 C) (Oral)  Resp 18  Ht 5\' 6"  (1.676 m)  Wt 205 lb (92.987 kg)  BMI 33.09 kg/m2  SpO2 96%  Physical Exam  Constitutional: She is oriented to person, place, and time. She appears well-developed and well-nourished. No distress.  HENT:  Head: Atraumatic.  Mouth/Throat: Oropharynx is clear and moist. No oropharyngeal exudate.  Eyes: Conjunctivae and EOM are normal. Pupils are equal, round, and reactive to light.       No appreciable papilledema  Neck: Normal range of motion. Neck supple.       No meningismus  Cardiovascular: Normal rate, regular rhythm and normal heart sounds.   Pulmonary/Chest: Breath sounds normal. No respiratory distress.  Abdominal: Soft. There is no tenderness. There is no rebound and no guarding.  Musculoskeletal: Normal range of motion. She exhibits no edema and no tenderness.  Neurological: She is alert and oriented to person, place, and time.       5 out of 5 strength throughout, no neurological deficits, cranial nerves intact, no ataxia on finger to nose  Skin: Skin is warm.    ED Course    Procedures (including critical care time)  Labs Reviewed - No data to display No results found.   1. Headache       MDM  Acute on chronic headache similar to previous. No neurological deficits today.   Patient was treated with cocktail of headache medications  and IV hydration.  She reports improvement in her symptoms after the first treatment and is requesting to go home. Her neurological exam remained stable and she is stable for follow up with her neurologist and PCP.      Glynn Octave, MD 05/25/11 [phone]

## 2011-05-25 NOTE — ED Notes (Signed)
MD at bedside. 

## 2011-05-25 NOTE — ED Notes (Signed)
C/o migraine headache, states that she has been having them since middle of September, states that things have gotten worse, has neurologist appointment on the 28th of November, states that she comes to er due to chronic migraine, does admit to some mild nausea

## 2011-05-29 ENCOUNTER — Encounter (HOSPITAL_COMMUNITY): Payer: Self-pay

## 2011-05-29 ENCOUNTER — Emergency Department (HOSPITAL_COMMUNITY)
Admission: EM | Admit: 2011-05-29 | Discharge: 2011-05-29 | Disposition: A | Discharge status: Home / Self Care | Payer: Medicaid Other | Attending: Emergency Medicine | Admitting: Emergency Medicine

## 2011-05-29 DIAGNOSIS — R42 Dizziness and giddiness: Secondary | ICD-10-CM | POA: Insufficient documentation

## 2011-05-29 DIAGNOSIS — F319 Bipolar disorder, unspecified: Secondary | ICD-10-CM | POA: Insufficient documentation

## 2011-05-29 DIAGNOSIS — Z7982 Long term (current) use of aspirin: Secondary | ICD-10-CM | POA: Insufficient documentation

## 2011-05-29 DIAGNOSIS — M129 Arthropathy, unspecified: Secondary | ICD-10-CM | POA: Insufficient documentation

## 2011-05-29 DIAGNOSIS — K5730 Diverticulosis of large intestine without perforation or abscess without bleeding: Secondary | ICD-10-CM | POA: Insufficient documentation

## 2011-05-29 DIAGNOSIS — H8109 Meniere's disease, unspecified ear: Secondary | ICD-10-CM | POA: Insufficient documentation

## 2011-05-29 DIAGNOSIS — J45909 Unspecified asthma, uncomplicated: Secondary | ICD-10-CM | POA: Insufficient documentation

## 2011-05-29 DIAGNOSIS — G43909 Migraine, unspecified, not intractable, without status migrainosus: Secondary | ICD-10-CM | POA: Insufficient documentation

## 2011-05-29 DIAGNOSIS — Z9079 Acquired absence of other genital organ(s): Secondary | ICD-10-CM | POA: Insufficient documentation

## 2011-05-29 DIAGNOSIS — IMO0001 Reserved for inherently not codable concepts without codable children: Secondary | ICD-10-CM | POA: Insufficient documentation

## 2011-05-29 DIAGNOSIS — Z87442 Personal history of urinary calculi: Secondary | ICD-10-CM | POA: Insufficient documentation

## 2011-05-29 DIAGNOSIS — F17200 Nicotine dependence, unspecified, uncomplicated: Secondary | ICD-10-CM | POA: Insufficient documentation

## 2011-05-29 MED ORDER — PROMETHAZINE HCL 25 MG PO TABS: 25.0000 mg | ORAL_TABLET | Freq: Four times a day (QID) | ORAL | Status: AC | PRN

## 2011-05-29 MED ORDER — DIPHENHYDRAMINE HCL 50 MG/ML IJ SOLN: 25.0000 mg | Freq: Once | INTRAMUSCULAR | Status: DC

## 2011-05-29 MED ORDER — KETOROLAC TROMETHAMINE 60 MG/2ML IM SOLN
60.0000 mg | Freq: Once | INTRAMUSCULAR | Status: AC
  Administered 2011-05-29: 60 mg via INTRAMUSCULAR
  Filled 2011-05-29: qty 2

## 2011-05-29 MED ORDER — ONDANSETRON 8 MG PO TBDP
8.0000 mg | ORAL_TABLET | Freq: Once | ORAL | Status: AC
  Administered 2011-05-29: 8 mg via ORAL
  Filled 2011-05-29: qty 1

## 2011-05-29 MED ORDER — DIPHENHYDRAMINE HCL 50 MG/ML IJ SOLN
25.0000 mg | Freq: Once | INTRAMUSCULAR | Status: AC
  Administered 2011-05-29: 25 mg via INTRAMUSCULAR
  Filled 2011-05-29: qty 1

## 2011-05-29 NOTE — ED Provider Notes (Signed)
History     CSN: [id number] Arrival date & time: 05/29/2011  2:24 PM   First MD Initiated Contact with Patient 05/29/11 1432      Chief Complaint  Patient presents with  . Migraine  . Dizziness    (Consider location/radiation/quality/duration/timing/severity/associated sxs/prior treatment) Patient is a 40 y.o. female presenting with migraine. The history is provided by the patient.  Migraine This is a recurrent problem. The current episode started today. The problem occurs constantly. The problem has been unchanged. Associated symptoms include headaches. Pertinent negatives include no abdominal pain, anorexia, arthralgias, chest pain, chills, congestion, fatigue, fever, joint swelling, nausea, neck pain, numbness, rash, sore throat, swollen glands, visual change or weakness. Associated symptoms comments: She woke this morning with return of her chronic intermittent migraine headache.  She was lightheaded when first awoke,  But this has resolved.  She was discharged from [facility] yesterday for IV meds and fluids for her migraine - sx resolved at the time of discharge.  She was prescribed a new unknown medicine for her headaches,  But has not filled it yet.  Headache is similar to prior migraines.  She was dropped off here after eating her Thanksgiving lunch at a [employer]..    Past Medical History  Diagnosis Date  . Asthma   . Bipolar 1 disorder   . Fibromyalgia   . Kidney stones   . Diverticulosis   . Arthritis   . S/P colonoscopy     Dr. Karilyn Cota 2010: few small diverticula at sigmoid. otherwise normal.   . Dysrhythmia     sts "I have heart palpitations"  . Shortness of breath   . Meniere's disease   . Headache     Past Surgical History  Procedure Date  . Tonsillectomy and adenoidectomy   . Umbilical hernia repair Dec 2011    Dr. Lovell Sheehan  . Abdominal hysterectomy   . Hemorrhoid surgery   . Cholecystectomy   . Foot surgery   . Left ovarian removal   .  Cesarean section   . Right ovarian removal   . Exploratory laparoscopy   . Wisdom teeth removal   . Multiple hernia repairs   . Tubes in ears   . Tonsillectomy   . Laparoscopy 02/19/2011    Procedure: LAPAROSCOPY DIAGNOSTIC;  Surgeon: Dalia Heading;  Location: [facility];  Service: General;  Laterality: N/A;  . Laparoscopic appendectomy 02/19/2011    Procedure: APPENDECTOMY LAPAROSCOPIC;  Surgeon: Dalia Heading;  Location: [facility];  Service: General;  Laterality: N/A;  . Appendectomy     Family History  Problem Relation Age of Onset  . Thyroid disease Mother   . Colon cancer      paternal and maternal grandfather  . Anesthesia problems Neg Hx   . Hypotension Neg Hx   . Malignant hyperthermia Neg Hx   . Pseudochol deficiency Neg Hx   . Meniere's disease Other     History  Substance Use Topics  . Smoking status: Current Everyday Smoker -- 0.5 packs/day for 20 years    Types: Cigarettes  . Smokeless tobacco: Never Used  . Alcohol Use: No     not since June 2012    OB History    Grav Para Term Preterm Abortions TAB SAB Ect Mult Living   5 2 2  3  2   2       Review of Systems  Constitutional: Negative for fever, chills and fatigue.  HENT: Negative for congestion, sore throat and  neck pain.   Eyes: Negative.   Respiratory: Negative for chest tightness and shortness of breath.   Cardiovascular: Negative for chest pain.  Gastrointestinal: Negative for nausea, abdominal pain and anorexia.  Genitourinary: Negative.   Musculoskeletal: Negative for joint swelling and arthralgias.  Skin: Negative.  Negative for rash and wound.  Neurological: Positive for headaches. Negative for dizziness, weakness, light-headedness and numbness.  Hematological: Negative.   Psychiatric/Behavioral: Negative.     Allergies  Amoxicillin  Home Medications   Current Outpatient Rx  Name Route Sig Dispense Refill  . ALBUTEROL SULFATE (2.5 MG/3ML) 0.083% IN NEBU Nebulization Take 2.5 mg by  nebulization every 6 (six) hours as needed. For wheezing, allergies, and/or shortness of breath    . ASPIRIN-ACETAMINOPHEN-CAFFEINE 250-250-65 MG PO TABS Oral Take 1 tablet by mouth every 6 (six) hours as needed. Migraine     . CYCLOBENZAPRINE HCL 10 MG PO TABS Oral Take 10 mg by mouth 2 (two) times daily as needed. For muscle spasms     . DIAZEPAM 10 MG PO TABS Oral Take 10 mg by mouth 4 (four) times daily - after meals and at bedtime. Takes three times daily as needed and every night    . DIVALPROEX SODIUM 250 MG PO TB24 Oral Take 1 tablet (250 mg total) by mouth 2 (two) times daily. 60 tablet 0  . DOXEPIN HCL 25 MG PO CAPS Oral Take 25 mg by mouth at bedtime.      . DULOXETINE HCL 30 MG PO CPEP Oral Take 30 mg by mouth 2 (two) times daily.      Marland Kitchen ESTROGENS CONJUGATED 0.625 MG PO TABS Oral Take 0.625 mg by mouth daily.     Marland Kitchen GABAPENTIN 600 MG PO TABS Oral Take 600 mg by mouth daily.     Marland Kitchen MECLIZINE HCL 25 MG PO TABS Oral Take 25 mg by mouth 3 (three) times daily as needed. For vertigo     . OMEPRAZOLE 20 MG PO CPDR Oral Take 20 mg by mouth daily.      Marland Kitchen ZOFRAN ODT PO Oral Take 1 tablet by mouth as needed. For nausea     . PROMETHAZINE HCL 25 MG PO TABS Oral Take 25 mg by mouth as needed. For nausea     . TOPIRAMATE 25 MG PO TABS Oral Take 25 mg by mouth 2 (two) times daily.        BP 119/72  Pulse 108  Temp(Src) 97.5 F (36.4 C) (Oral)  Resp 18  Ht 5\' 6"  (1.676 m)  Wt 215 lb (97.523 kg)  BMI 34.70 kg/m2  SpO2 97%  Physical Exam  Nursing note and vitals reviewed. Constitutional: She is oriented to person, place, and time. She appears well-developed and well-nourished.       Uncomfortable appearing  HENT:  Head: Normocephalic and atraumatic.  Mouth/Throat: Oropharynx is clear and moist.  Eyes: EOM are normal. Pupils are equal, round, and reactive to light.  Neck: Normal range of motion. Neck supple.  Cardiovascular: Normal rate and normal heart sounds.   Pulmonary/Chest: Effort  normal.  Abdominal: Soft. There is no tenderness.  Musculoskeletal: Normal range of motion.  Lymphadenopathy:    She has no cervical adenopathy.  Neurological: She is alert and oriented to person, place, and time. She has normal strength. No cranial nerve deficit or sensory deficit. She displays a negative Romberg sign. Gait normal. GCS eye subscore is 4. GCS verbal subscore is 5. GCS motor subscore is 6.  Normal heel-shin, normal rapid alternating movements.  Skin: Skin is warm and dry. No rash noted.  Psychiatric: She has a normal mood and affect. Her speech is normal and behavior is normal. Thought content normal. Cognition and memory are normal.    ED Course  Procedures (including critical care time)  Labs Reviewed - No data to display No results found.   No diagnosis found.  Given toradol 60 IM,  zofran 8 odt,  Benadryl 25 mg IM - headache much improved upon discharge.  Remains neurologically intact.  MDM  Patient prescribed phenergan for home use - she is currently out of this med.        Candis Musa, PA 05/29/11 1704  Candis Musa, PA 05/29/11 [phone]

## 2011-05-29 NOTE — ED Notes (Signed)
Pt presents with migraine and dizziness. Pt was just discharged from [facility] yesterday. Pt was admitted for the same on Tuesday. Pt states she felt better until today. Pt states she has been unable to get new medication because [facility] had to order it.

## 2011-05-31 NOTE — ED Provider Notes (Signed)
Evaluation and management procedures were performed by the PA/NP under my supervision/collaboration.   Dione Booze, MD 05/31/11 [phone]

## 2011-06-19 ENCOUNTER — Emergency Department (HOSPITAL_COMMUNITY)
Admission: EM | Admit: 2011-06-19 | Discharge: 2011-06-19 | Disposition: A | Discharge status: Home / Self Care | Payer: Medicaid Other | Attending: Emergency Medicine | Admitting: Emergency Medicine

## 2011-06-19 ENCOUNTER — Encounter (HOSPITAL_COMMUNITY): Payer: Self-pay | Admitting: Emergency Medicine

## 2011-06-19 DIAGNOSIS — M129 Arthropathy, unspecified: Secondary | ICD-10-CM | POA: Insufficient documentation

## 2011-06-19 DIAGNOSIS — F319 Bipolar disorder, unspecified: Secondary | ICD-10-CM | POA: Insufficient documentation

## 2011-06-19 DIAGNOSIS — K5730 Diverticulosis of large intestine without perforation or abscess without bleeding: Secondary | ICD-10-CM | POA: Insufficient documentation

## 2011-06-19 DIAGNOSIS — F17200 Nicotine dependence, unspecified, uncomplicated: Secondary | ICD-10-CM | POA: Insufficient documentation

## 2011-06-19 DIAGNOSIS — G43909 Migraine, unspecified, not intractable, without status migrainosus: Secondary | ICD-10-CM

## 2011-06-19 DIAGNOSIS — J45909 Unspecified asthma, uncomplicated: Secondary | ICD-10-CM | POA: Insufficient documentation

## 2011-06-19 DIAGNOSIS — IMO0001 Reserved for inherently not codable concepts without codable children: Secondary | ICD-10-CM | POA: Insufficient documentation

## 2011-06-19 MED ORDER — METOCLOPRAMIDE HCL 5 MG/ML IJ SOLN: 10.0000 mg | Freq: Once | INTRAMUSCULAR | Status: DC

## 2011-06-19 MED ORDER — SUMATRIPTAN SUCCINATE 100 MG PO TABS: 50.0000 mg | ORAL_TABLET | ORAL | Status: DC | PRN

## 2011-06-19 MED ORDER — KETOROLAC TROMETHAMINE 30 MG/ML IJ SOLN: 30.0000 mg | Freq: Once | INTRAMUSCULAR | Status: DC

## 2011-06-19 MED ORDER — ONDANSETRON 8 MG PO TBDP
8.0000 mg | ORAL_TABLET | Freq: Once | ORAL | Status: AC
  Administered 2011-06-19: 8 mg via ORAL
  Filled 2011-06-19: qty 1

## 2011-06-19 MED ORDER — SODIUM CHLORIDE 0.9 % IV BOLUS (SEPSIS): 1000.0000 mL | Freq: Once | INTRAVENOUS | Status: DC

## 2011-06-19 MED ORDER — ONDANSETRON HCL 4 MG PO TABS: 4.0000 mg | ORAL_TABLET | Freq: Four times a day (QID) | ORAL | Status: AC

## 2011-06-19 MED ORDER — ONDANSETRON HCL 4 MG/2ML IJ SOLN: 4.0000 mg | Freq: Once | INTRAMUSCULAR | Status: DC

## 2011-06-19 MED ORDER — SUMATRIPTAN SUCCINATE 6 MG/0.5ML ~~LOC~~ SOLN
6.0000 mg | Freq: Once | SUBCUTANEOUS | Status: AC
  Administered 2011-06-19: 6 mg via SUBCUTANEOUS
  Filled 2011-06-19: qty 0.5

## 2011-06-19 NOTE — ED Notes (Signed)
C/o migraine HA pain since September; reports is followed by neurologist at [facility]-last seen this month; had MR brain done last week; c/o right sided headache, throbbing, worse with exposure to light/noise.  Reports has been taking Imitrex with relief, but ran out and cannot afford to get refill; denies n/v.

## 2011-06-19 NOTE — ED Notes (Signed)
Pt c/o migraine/vertigo. Pt states she ran out of her Imitrex today.

## 2011-06-19 NOTE — ED Provider Notes (Signed)
History  Scribed for Allison Hutching, MD, the patient was seen in room APA11/APA11. This chart was scribed by Candelaria Stagers. The patient's care started at 3:33 PM    CSN: [id number] Arrival date & time: 06/19/2011  2:58 PM   First MD Initiated Contact with Patient 06/19/11 1515      Chief Complaint  Patient presents with  . Migraine    The history is provided by the patient.  Allison Bolton is a 40 y.o. female who presents to the Emergency Department complaining of a migraine that began this morning that she describes as a stabbing pain that it felt on the right frontal parietal area.  She states that she took her last dose of Imitrex this morning with no relief and has since ran out.  She is experiencing slight blurred vision, photophobia, and nausea.  Her neurologist is at [facility] in [place], [place].  She has a h/o Meniere's disease.       Past Medical History  Diagnosis Date  . Asthma   . Bipolar 1 disorder   . Fibromyalgia   . Kidney stones   . Diverticulosis   . Arthritis   . S/P colonoscopy     Dr. Karilyn Cota 2010: few small diverticula at sigmoid. otherwise normal.   . Dysrhythmia     sts "I have heart palpitations"  . Shortness of breath   . Meniere's disease   . Headache     Past Surgical History  Procedure Date  . Tonsillectomy and adenoidectomy   . Umbilical hernia repair Dec 2011    Dr. Lovell Sheehan  . Abdominal hysterectomy   . Hemorrhoid surgery   . Cholecystectomy   . Foot surgery   . Left ovarian removal   . Cesarean section   . Right ovarian removal   . Exploratory laparoscopy   . Wisdom teeth removal   . Multiple hernia repairs   . Tubes in ears   . Tonsillectomy   . Laparoscopy 02/19/2011    Procedure: LAPAROSCOPY DIAGNOSTIC;  Surgeon: Dalia Heading;  Location: [facility];  Service: General;  Laterality: N/A;  . Laparoscopic appendectomy 02/19/2011    Procedure: APPENDECTOMY LAPAROSCOPIC;  Surgeon: Dalia Heading;  Location: [facility];   Service: General;  Laterality: N/A;  . Appendectomy     Family History  Problem Relation Age of Onset  . Thyroid disease Mother   . Colon cancer      paternal and maternal grandfather  . Anesthesia problems Neg Hx   . Hypotension Neg Hx   . Malignant hyperthermia Neg Hx   . Pseudochol deficiency Neg Hx   . Meniere's disease Other     History  Substance Use Topics  . Smoking status: Current Everyday Smoker -- 0.5 packs/day for 20 years    Types: Cigarettes  . Smokeless tobacco: Never Used  . Alcohol Use: No     not since June 2012    OB History    Grav Para Term Preterm Abortions TAB SAB Ect Mult Living   5 2 2  3  2   2       Review of Systems  Constitutional: Negative for fever.  HENT: Negative for rhinorrhea and neck stiffness.   Eyes: Positive for photophobia. Negative for pain.  Respiratory: Negative for cough and shortness of breath.   Cardiovascular: Negative for chest pain.  Gastrointestinal: Positive for nausea. Negative for vomiting, abdominal pain and diarrhea.  Genitourinary: Negative for dysuria.  Musculoskeletal: Negative for back pain.  Skin: Negative for rash.  Neurological: Positive for headaches. Negative for weakness.    Allergies  Amoxicillin  Home Medications   Current Outpatient Rx  Name Route Sig Dispense Refill  . ALBUTEROL SULFATE (2.5 MG/3ML) 0.083% IN NEBU Nebulization Take 2.5 mg by nebulization every 6 (six) hours as needed. For wheezing, allergies, and/or shortness of breath    . ASPIRIN-ACETAMINOPHEN-CAFFEINE 250-250-65 MG PO TABS Oral Take 1 tablet by mouth every 6 (six) hours as needed. Migraine     . CYCLOBENZAPRINE HCL 10 MG PO TABS Oral Take 10 mg by mouth 2 (two) times daily as needed. For muscle spasms     . DIAZEPAM 10 MG PO TABS Oral Take 10 mg by mouth 4 (four) times daily - after meals and at bedtime. Takes three times daily as needed and every night    . DIVALPROEX SODIUM ER 250 MG PO TB24 Oral Take 1 tablet (250 mg  total) by mouth 2 (two) times daily. 60 tablet 0  . DOXEPIN HCL 25 MG PO CAPS Oral Take 25 mg by mouth at bedtime.      . DULOXETINE HCL 30 MG PO CPEP Oral Take 30 mg by mouth 2 (two) times daily.      Marland Kitchen ESTROGENS CONJUGATED 0.625 MG PO TABS Oral Take 0.625 mg by mouth daily.     Marland Kitchen GABAPENTIN 600 MG PO TABS Oral Take 600 mg by mouth daily.     Marland Kitchen MECLIZINE HCL 25 MG PO TABS Oral Take 25 mg by mouth 3 (three) times daily as needed. For vertigo     . OMEPRAZOLE 20 MG PO CPDR Oral Take 20 mg by mouth daily.      Marland Kitchen ZOFRAN ODT PO Oral Take 1 tablet by mouth as needed. For nausea     . PROMETHAZINE HCL 25 MG PO TABS Oral Take 25 mg by mouth as needed. For nausea     . TOPIRAMATE 25 MG PO TABS Oral Take 25 mg by mouth 2 (two) times daily.        BP 117/80  Pulse 101  Temp(Src) 97.7 F (36.5 C) (Oral)  Resp 20  Ht 5\' 6"  (1.676 m)  Wt 215 lb (97.523 kg)  BMI 34.70 kg/m2  SpO2 99%  Physical Exam  Nursing note and vitals reviewed. Constitutional: She is oriented to person, place, and time. She appears well-developed and well-nourished. No distress.  HENT:  Head: Normocephalic and atraumatic.       photophobia  Eyes: EOM are normal. Pupils are equal, round, and reactive to light.  Neck: Neck supple. No tracheal deviation present.  Cardiovascular: Normal rate.   Pulmonary/Chest: Effort normal. No respiratory distress.  Abdominal: Soft. She exhibits no distension.  Musculoskeletal: Normal range of motion. She exhibits no edema.  Neurological: She is alert and oriented to person, place, and time. No sensory deficit.  Skin: Skin is warm and dry.  Psychiatric: She has a normal mood and affect. Her behavior is normal.    ED Course  Procedures  Will give subcutaneous Imitrex for headache and sublingual Zofran for nausea.    DIAGNOSTIC STUDIES: Oxygen Saturation is 99% on room air, normal by my interpretation.    COORDINATION OF CARE:  3:53 PM Ordered: SUMAtriptan (IMITREX) injection 6 mg  ; ondansetron (ZOFRAN-ODT) disintegrating tablet 8 mg    Labs Reviewed - No data to display No results found.   No diagnosis found.    MDM  No neuro deficits. No stiff neck. Will  Rx Imitrex and Zofran and discharge home with same   I personally performed the services described in this documentation, which was scribed in my presence. The recorded information has been reviewed and considered.        Allison Hutching, MD 06/19/11 [phone]

## 2011-06-19 NOTE — ED Notes (Signed)
A&Ox4; in no distress; reports HA pain decreased. Instructions given and reviewed; verbalizes understanding.

## 2011-06-26 ENCOUNTER — Encounter (HOSPITAL_COMMUNITY): Payer: Self-pay | Admitting: Emergency Medicine

## 2011-06-26 ENCOUNTER — Inpatient Hospital Stay (HOSPITAL_COMMUNITY)
Admission: AD | Admit: 2011-06-26 | Discharge: 2011-07-09 | DRG: 885 | Disposition: A | Discharge status: Home / Self Care | Payer: Medicaid Other | Source: Ambulatory Visit | Attending: Psychiatry | Admitting: Psychiatry

## 2011-06-26 ENCOUNTER — Emergency Department (HOSPITAL_COMMUNITY)
Admission: EM | Admit: 2011-06-26 | Discharge: 2011-06-26 | Disposition: A | Discharge status: Other Acute Inpatient Hospital | Payer: Medicaid Other | Source: Home / Self Care | Attending: Emergency Medicine | Admitting: Emergency Medicine

## 2011-06-26 ENCOUNTER — Encounter (HOSPITAL_COMMUNITY): Payer: Self-pay

## 2011-06-26 DIAGNOSIS — F3130 Bipolar disorder, current episode depressed, mild or moderate severity, unspecified: Principal | ICD-10-CM

## 2011-06-26 DIAGNOSIS — R51 Headache: Secondary | ICD-10-CM

## 2011-06-26 DIAGNOSIS — R45851 Suicidal ideations: Secondary | ICD-10-CM

## 2011-06-26 DIAGNOSIS — H8109 Meniere's disease, unspecified ear: Secondary | ICD-10-CM

## 2011-06-26 DIAGNOSIS — J45909 Unspecified asthma, uncomplicated: Secondary | ICD-10-CM

## 2011-06-26 DIAGNOSIS — F29 Unspecified psychosis not due to a substance or known physiological condition: Secondary | ICD-10-CM

## 2011-06-26 DIAGNOSIS — F411 Generalized anxiety disorder: Secondary | ICD-10-CM | POA: Diagnosis present

## 2011-06-26 DIAGNOSIS — F314 Bipolar disorder, current episode depressed, severe, without psychotic features: Secondary | ICD-10-CM | POA: Diagnosis present

## 2011-06-26 DIAGNOSIS — IMO0001 Reserved for inherently not codable concepts without codable children: Secondary | ICD-10-CM

## 2011-06-26 DIAGNOSIS — Z560 Unemployment, unspecified: Secondary | ICD-10-CM

## 2011-06-26 DIAGNOSIS — Z87442 Personal history of urinary calculi: Secondary | ICD-10-CM

## 2011-06-26 DIAGNOSIS — Z880 Allergy status to penicillin: Secondary | ICD-10-CM

## 2011-06-26 DIAGNOSIS — M129 Arthropathy, unspecified: Secondary | ICD-10-CM

## 2011-06-26 HISTORY — DX: Personal history of other diseases of the digestive system: Z87.19

## 2011-06-26 LAB — BASIC METABOLIC PANEL
CO2: 22 mEq/L (ref 19–32)
Chloride: 104 mEq/L (ref 96–112)
Creatinine, Ser: 0.58 mg/dL (ref 0.50–1.10)
GFR calc Af Amer: >90 mL/min (ref >90)
Potassium: 3.6 mEq/L (ref 3.5–5.1)
Sodium: 137 mEq/L (ref 135–145)

## 2011-06-26 LAB — CBC
HCT: 41.2 % (ref 36.0–46.0)
Hemoglobin: 14 g/dL (ref 12.0–15.0)
MCV: 87.3 fL (ref 78.0–100.0)
RBC: 4.72 MIL/uL (ref 3.87–5.11)
RDW: 13.4 % (ref 11.5–15.5)
WBC: 11 10*3/uL — ABNORMAL HIGH (ref 4.0–10.5)

## 2011-06-26 LAB — RAPID URINE DRUG SCREEN, HOSP PERFORMED
Amphetamines: NOT DETECTED
Cocaine: NOT DETECTED
Opiates: NOT DETECTED
Tetrahydrocannabinol: NOT DETECTED

## 2011-06-26 LAB — PREGNANCY, URINE: Preg Test, Ur: NEGATIVE

## 2011-06-26 MED ORDER — ACETAMINOPHEN 500 MG PO TABS
1000.0000 mg | ORAL_TABLET | Freq: Once | ORAL | Status: AC
  Administered 2011-06-26: 1000 mg via ORAL
  Filled 2011-06-26: qty 2

## 2011-06-26 MED ORDER — METOCLOPRAMIDE HCL 5 MG/ML IJ SOLN
20.0000 mg | Freq: Once | INTRAMUSCULAR | Status: AC
  Administered 2011-06-26: 20 mg via INTRAVENOUS
  Filled 2011-06-26: qty 4

## 2011-06-26 MED ORDER — METOCLOPRAMIDE HCL 5 MG/ML IJ SOLN
INTRAMUSCULAR | Status: AC
  Filled 2011-06-26: qty 2

## 2011-06-26 MED ORDER — KETOROLAC TROMETHAMINE 30 MG/ML IJ SOLN
30.0000 mg | Freq: Once | INTRAMUSCULAR | Status: AC
Start: 2011-06-26 — End: 2011-06-26
  Administered 2011-06-26: 30 mg via INTRAVENOUS
  Filled 2011-06-26: qty 1

## 2011-06-26 MED ORDER — DIPHENHYDRAMINE HCL 50 MG/ML IJ SOLN
25.0000 mg | Freq: Once | INTRAMUSCULAR | Status: AC
  Administered 2011-06-26: 25 mg via INTRAVENOUS
  Filled 2011-06-26: qty 1

## 2011-06-26 MED ORDER — SODIUM CHLORIDE 0.9 % IV BOLUS (SEPSIS)
1000.0000 mL | Freq: Once | INTRAVENOUS | Status: AC
  Administered 2011-06-26: 1000 mL via INTRAVENOUS
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
## 2011-06-26 NOTE — ED Notes (Signed)
[facility] called for transport to [facility]. 

## 2011-06-26 NOTE — ED Notes (Signed)
Transferred to [facility] per [facility]
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
## 2011-06-26 NOTE — ED Notes (Signed)
Reports HA returning - EDP notified and orders rec'd 

## 2011-06-26 NOTE — ED Notes (Signed)
Information faxed to [facility] telepsych for consult and telephoned re: consult.  Dr. Berlin Hun will be in touch after receiving and reviewing the information.

## 2011-06-26 NOTE — ED Notes (Signed)
Pt in no apparent distress; eating supper; reports 8/10 HA pain. Medicated as ordered.
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
## 2011-06-26 NOTE — ED Provider Notes (Signed)
History     CSN: [id number]  Arrival date & time 06/26/11  1410   First MD Initiated Contact with Patient 06/26/11 1427      Chief Complaint  Patient presents with  . Suicidal  . Migraine    (Consider location/radiation/quality/duration/timing/severity/associated sxs/prior treatment) HPI The patient presents with 2 chief complaints. Chief complaint #1: Headache. She notes that she is a headache of 2 days' duration, onset was gradual. The pain is diffuse, throbbing. The pain is "the same" as innumerable prior migraine headaches. No visual changes, no ataxia, no emesis. No relief with OTC medications. Chief complaint #2 suicidal ideation.  The patient notes that she is under a significant amount of stress, including having her 88-year-old daughter removed from her care due to allegations of her inability to appropriately care for her child and herself. The patient's increasing stress, has not been relieved with prescription medications. She now notes that she is feeling suicidal, without a distinct plan.  Past Medical History  Diagnosis Date  . Asthma   . Bipolar 1 disorder   . Fibromyalgia   . Kidney stones   . Diverticulosis   . Arthritis   . S/P colonoscopy     Dr. Karilyn Cota 2010: few small diverticula at sigmoid. otherwise normal.   . Dysrhythmia     sts "I have heart palpitations"  . Shortness of breath   . Meniere's disease   . Headache     Past Surgical History  Procedure Date  . Tonsillectomy and adenoidectomy   . Umbilical hernia repair Dec 2011    Dr. Lovell Sheehan  . Abdominal hysterectomy   . Hemorrhoid surgery   . Cholecystectomy   . Foot surgery   . Left ovarian removal   . Cesarean section   . Right ovarian removal   . Exploratory laparoscopy   . Wisdom teeth removal   . Multiple hernia repairs   . Tubes in ears   . Tonsillectomy   . Laparoscopy 02/19/2011    Procedure: LAPAROSCOPY DIAGNOSTIC;  Surgeon: Dalia Heading;  Location: [facility];  Service: General;   Laterality: N/A;  . Laparoscopic appendectomy 02/19/2011    Procedure: APPENDECTOMY LAPAROSCOPIC;  Surgeon: Dalia Heading;  Location: [facility];  Service: General;  Laterality: N/A;  . Appendectomy     Family History  Problem Relation Age of Onset  . Thyroid disease Mother   . Colon cancer      paternal and maternal grandfather  . Anesthesia problems Neg Hx   . Hypotension Neg Hx   . Malignant hyperthermia Neg Hx   . Pseudochol deficiency Neg Hx   . Meniere's disease Other     History  Substance Use Topics  . Smoking status: Current Everyday Smoker -- 0.5 packs/day for 20 years    Types: Cigarettes  . Smokeless tobacco: Never Used  . Alcohol Use: No     not since June 2012    OB History    Grav Para Term Preterm Abortions TAB SAB Ect Mult Living   5 2 2  3  2   2       Review of Systems  Constitutional: Negative.   HENT: Negative for neck pain and neck stiffness.   Eyes: Negative for visual disturbance.  Respiratory: Negative.   Cardiovascular: Negative.   Gastrointestinal: Negative.   Genitourinary: Negative.   Musculoskeletal: Negative.   Neurological: Positive for headaches.  Psychiatric/Behavioral: Positive for suicidal ideas.    Allergies  Amoxicillin  Home Medications  Current Outpatient Rx  Name Route Sig Dispense Refill  . ALBUTEROL SULFATE (2.5 MG/3ML) 0.083% IN NEBU Nebulization Take 2.5 mg by nebulization every 6 (six) hours as needed. For wheezing, allergies, and/or shortness of breath    . ASPIRIN-ACETAMINOPHEN-CAFFEINE 250-250-65 MG PO TABS Oral Take 1 tablet by mouth every 6 (six) hours as needed. Migraine     . CELECOXIB 200 MG PO CAPS Oral Take 200 mg by mouth 2 (two) times daily.      . CYCLOBENZAPRINE HCL 10 MG PO TABS Oral Take 10 mg by mouth 2 (two) times daily as needed. For muscle spasms     . DIAZEPAM 10 MG PO TABS Oral Take 10 mg by mouth 4 (four) times daily - after meals and at bedtime. Takes three times daily as needed and every night     . DIVALPROEX SODIUM ER 250 MG PO TB24 Oral Take 1 tablet (250 mg total) by mouth 2 (two) times daily. 60 tablet 0  . DOXEPIN HCL 25 MG PO CAPS Oral Take 25 mg by mouth at bedtime.      . DULOXETINE HCL 30 MG PO CPEP Oral Take 30 mg by mouth 2 (two) times daily.      Marland Kitchen ESTROGENS CONJUGATED 0.625 MG PO TABS Oral Take 0.625 mg by mouth daily.     Marland Kitchen MECLIZINE HCL 25 MG PO TABS Oral Take 25 mg by mouth 3 (three) times daily as needed. For vertigo     . OMEPRAZOLE 20 MG PO CPDR Oral Take 20 mg by mouth every morning.     Marland Kitchen ONDANSETRON HCL 4 MG PO TABS Oral Take 1 tablet (4 mg total) by mouth every 6 (six) hours. 12 tablet 0  . PROMETHAZINE HCL 25 MG PO TABS Oral Take 25 mg by mouth daily as needed. For nausea    . SUMATRIPTAN SUCCINATE 100 MG PO TABS Oral Take 0.5 tablets (50 mg total) by mouth every 2 (two) hours as needed for migraine. 20 tablet 0  . TOPIRAMATE 25 MG PO TABS Oral Take 25 mg by mouth 2 (two) times daily.        BP 111/80  Pulse 86  Temp(Src) 97.3 F (36.3 C) (Oral)  Resp 18  Ht 5\' 6"  (1.676 m)  Wt 200 lb (90.719 kg)  BMI 32.28 kg/m2  SpO2 99%  Physical Exam  Nursing note and vitals reviewed. Constitutional: She is oriented to person, place, and time. She appears well-developed and well-nourished. No distress.  HENT:  Head: Normocephalic and atraumatic.  Eyes: Conjunctivae and EOM are normal.  Cardiovascular: Normal rate and regular rhythm.   Pulmonary/Chest: Effort normal and breath sounds normal. No stridor.  Abdominal: She exhibits no distension.  Musculoskeletal: She exhibits no edema and no tenderness.  Neurological: She is alert and oriented to person, place, and time.  Skin: Skin is warm and dry.  Psychiatric: She has a normal mood and affect.    ED Course  Procedures (including critical care time)   Labs Reviewed  BASIC METABOLIC PANEL  CBC  DRUGS OF ABUSE SCREEN W ALC, ROUTINE URINE  ETHANOL  PREGNANCY, URINE   No results found.   No diagnosis  found.  Smoking cessation counseling provided  MDM  This 40 year old female with history of bipolar disease, fibromyalgia, migraine headaches, depression now presents with headache and new suicidal ideation. On exam the patient is in no distress though she is uncomfortable in appearance. The patient's endorsement of suicidal thoughts is concerning for  her safety. The absence of neurologic complaints or findings his reassuring for chronic migraine headache. The patient will receive analgesia, laboratory evaluation, and is appropriate for psychiatric evaluation.        Gerhard Munch, MD 06/26/11 [phone]

## 2011-06-26 NOTE — ED Notes (Signed)
Patient c/o feeling suicidial due to daughter being taking away yesterday by social services. Patient denies making an attempt. Patient denies any homicidal thoughts. Patient also c/o migraines with nausea that started this morning.

## 2011-06-27 DIAGNOSIS — F29 Unspecified psychosis not due to a substance or known physiological condition: Secondary | ICD-10-CM

## 2011-06-27 DIAGNOSIS — F3130 Bipolar disorder, current episode depressed, mild or moderate severity, unspecified: Principal | ICD-10-CM

## 2011-06-27 DIAGNOSIS — F3160 Bipolar disorder, current episode mixed, unspecified: Secondary | ICD-10-CM

## 2011-06-27 DIAGNOSIS — F314 Bipolar disorder, current episode depressed, severe, without psychotic features: Secondary | ICD-10-CM | POA: Diagnosis present

## 2011-06-27 DIAGNOSIS — F411 Generalized anxiety disorder: Secondary | ICD-10-CM | POA: Diagnosis present

## 2011-06-27 MED ORDER — RISPERIDONE 0.5 MG PO TABS
0.5000 mg | ORAL_TABLET | Freq: Four times a day (QID) | ORAL | Status: DC | PRN
  Administered 2011-06-27 – 2011-07-08 (×11): 0.5 mg via ORAL
  Filled 2011-06-27 (×12): qty 1

## 2011-06-27 MED ORDER — ESTROGENS CONJUGATED 0.625 MG PO TABS
0.6250 mg | ORAL_TABLET | Freq: Every day | ORAL | Status: DC
  Administered 2011-06-27 – 2011-07-09 (×13): 0.625 mg via ORAL
  Filled 2011-06-27: qty 1
  Filled 2011-06-27 (×2): qty 3
  Filled 2011-06-27 (×2): qty 1
  Filled 2011-06-27 (×2): qty 3
  Filled 2011-06-27: qty 1
  Filled 2011-06-27: qty 3
  Filled 2011-06-27: qty 1
  Filled 2011-06-27: qty 3
  Filled 2011-06-27: qty 1
  Filled 2011-06-27 (×2): qty 3

## 2011-06-27 MED ORDER — MAGNESIUM HYDROXIDE 400 MG/5ML PO SUSP: 30.0000 mL | Freq: Every day | ORAL | Status: DC | PRN

## 2011-06-27 MED ORDER — TOPIRAMATE 100 MG PO TABS
100.0000 mg | ORAL_TABLET | Freq: Every day | ORAL | Status: DC
  Administered 2011-06-27 – 2011-07-08 (×12): 100 mg via ORAL
  Filled 2011-06-27: qty 1
  Filled 2011-06-27 (×2): qty 10
  Filled 2011-06-27: qty 1
  Filled 2011-06-27 (×4): qty 10
  Filled 2011-06-27: qty 1
  Filled 2011-06-27: qty 10
  Filled 2011-06-27: qty 1
  Filled 2011-06-27: qty 10
  Filled 2011-06-27: qty 1
  Filled 2011-06-27: qty 10

## 2011-06-27 MED ORDER — DULOXETINE HCL 30 MG PO CPEP
30.0000 mg | ORAL_CAPSULE | Freq: Every day | ORAL | Status: DC
  Administered 2011-06-28 – 2011-07-09 (×12): 30 mg via ORAL
  Filled 2011-06-27 (×2): qty 14
  Filled 2011-06-27 (×3): qty 1
  Filled 2011-06-27 (×4): qty 14
  Filled 2011-06-27: qty 1
  Filled 2011-06-27: qty 14
  Filled 2011-06-27: qty 1
  Filled 2011-06-27: qty 14

## 2011-06-27 MED ORDER — LORAZEPAM 1 MG PO TABS
1.0000 mg | ORAL_TABLET | Freq: Four times a day (QID) | ORAL | Status: DC | PRN
  Administered 2011-06-27: 1 mg via ORAL
  Filled 2011-06-27: qty 1

## 2011-06-27 MED ORDER — DIVALPROEX SODIUM ER 500 MG PO TB24
500.0000 mg | ORAL_TABLET | ORAL | Status: DC
  Administered 2011-06-27 – 2011-06-30 (×6): 500 mg via ORAL
  Filled 2011-06-27 (×10): qty 1

## 2011-06-27 MED ORDER — PANTOPRAZOLE SODIUM 40 MG PO TBEC
40.0000 mg | DELAYED_RELEASE_TABLET | Freq: Every day | ORAL | Status: DC
  Administered 2011-06-27 – 2011-07-08 (×12): 40 mg via ORAL
  Filled 2011-06-27 (×3): qty 3
  Filled 2011-06-27 (×3): qty 1
  Filled 2011-06-27 (×2): qty 3
  Filled 2011-06-27: qty 1
  Filled 2011-06-27 (×3): qty 3
  Filled 2011-06-27: qty 1

## 2011-06-27 MED ORDER — ACETAMINOPHEN 325 MG PO TABS
650.0000 mg | ORAL_TABLET | Freq: Four times a day (QID) | ORAL | Status: DC | PRN
  Administered 2011-06-30 – 2011-07-05 (×2): 650 mg via ORAL

## 2011-06-27 MED ORDER — PANTOPRAZOLE SODIUM 40 MG PO TBEC: 40.0000 mg | DELAYED_RELEASE_TABLET | Freq: Every day | ORAL | Status: DC

## 2011-06-27 MED ORDER — ASPIRIN-ACETAMINOPHEN-CAFFEINE 250-250-65 MG PO TABS
2.0000 | ORAL_TABLET | Freq: Four times a day (QID) | ORAL | Status: DC | PRN
  Administered 2011-06-27 – 2011-06-30 (×5): 2 via ORAL
  Filled 2011-06-27 (×3): qty 2

## 2011-06-27 MED ORDER — TRAZODONE HCL 100 MG PO TABS: 100.0000 mg | ORAL_TABLET | Freq: Every evening | ORAL | Status: DC | PRN

## 2011-06-27 MED ORDER — CLONAZEPAM 1 MG PO TABS
1.0000 mg | ORAL_TABLET | ORAL | Status: DC
  Administered 2011-06-27 – 2011-07-09 (×35): 1 mg via ORAL
  Filled 2011-06-27 (×35): qty 1

## 2011-06-27 MED ORDER — ALUM & MAG HYDROXIDE-SIMETH 200-200-20 MG/5ML PO SUSP
30.0000 mL | ORAL | Status: DC | PRN
  Administered 2011-06-27 – 2011-07-03 (×3): 30 mL via ORAL

## 2011-06-27 MED ORDER — TOPIRAMATE 100 MG PO TABS: 100.0000 mg | ORAL_TABLET | Freq: Every day | ORAL | Status: DC

## 2011-06-27 MED ORDER — CELECOXIB 100 MG PO CAPS
200.0000 mg | ORAL_CAPSULE | ORAL | Status: DC
  Administered 2011-06-27 – 2011-07-09 (×23): 200 mg via ORAL
  Filled 2011-06-27: qty 12
  Filled 2011-06-27: qty 1
  Filled 2011-06-27 (×2): qty 12
  Filled 2011-06-27: qty 1
  Filled 2011-06-27 (×3): qty 12
  Filled 2011-06-27 (×2): qty 1
  Filled 2011-06-27: qty 12
  Filled 2011-06-27 (×2): qty 1
  Filled 2011-06-27 (×3): qty 12
  Filled 2011-06-27: qty 1
  Filled 2011-06-27: qty 12
  Filled 2011-06-27: qty 1
  Filled 2011-06-27 (×2): qty 12
  Filled 2011-06-27 (×2): qty 1
  Filled 2011-06-27 (×3): qty 12

## 2011-06-27 MED ORDER — ALBUTEROL SULFATE (5 MG/ML) 0.5% IN NEBU: 2.5000 mg | INHALATION_SOLUTION | Freq: Four times a day (QID) | RESPIRATORY_TRACT | Status: DC | PRN

## 2011-06-27 MED ORDER — DULOXETINE HCL 60 MG PO CPEP
60.0000 mg | ORAL_CAPSULE | Freq: Every day | ORAL | Status: DC
  Administered 2011-06-27 – 2011-07-09 (×13): 60 mg via ORAL
  Filled 2011-06-27: qty 14
  Filled 2011-06-27: qty 1
  Filled 2011-06-27: qty 14
  Filled 2011-06-27: qty 1
  Filled 2011-06-27 (×5): qty 14
  Filled 2011-06-27: qty 1
  Filled 2011-06-27: qty 14
  Filled 2011-06-27 (×3): qty 1

## 2011-06-27 MED ORDER — MECLIZINE HCL 25 MG PO TABS
25.0000 mg | ORAL_TABLET | Freq: Three times a day (TID) | ORAL | Status: DC | PRN
  Administered 2011-06-27 – 2011-07-01 (×5): 25 mg via ORAL
  Filled 2011-06-27 (×3): qty 1

## 2011-06-27 NOTE — Progress Notes (Signed)
Suicide Risk Assessment  Admission Assessment     Demographic factors:  Assessment Details Time of Assessment: Admission Information Obtained From: Patient Current Mental Status:  AO x 3. Loss Factors:  Loss Factors: Loss of significant relationship;Legal issues;Financial problems / change in socioeconomic status Historical Factors:  Historical Factors: Family history of mental illness or substance abuse Risk Reduction Factors:  Risk Reduction Factors: Positive social support  CLINICAL FACTORS:   Severe Anxiety and/or Agitation Bipolar Disorder:   Depressive phase Depression:   Anhedonia Hopelessness Insomnia Severe Chronic Pain More than one psychiatric diagnosis Previous Psychiatric Diagnoses and Treatments Medical Diagnoses and Treatments/Surgeries  COGNITIVE FEATURES THAT CONTRIBUTE TO RISK:  Loss of executive function    Diagnosis:  Axis I:  Bipolar I Disorder - Depressed State. Generalized Anxiety Disorder  The patient was seen today and reports the following:   ADL's: Intact  Sleep: The patient reports to having significant difficulty initiating and maintaining sleep.  Appetite: The patient reports a good appetite.   Mild>(1-10) >Severe  Hopelessness (1-10): 10  Depression (1-10): 10  Anxiety (1-10): 10   Suicidal Ideation: The patient denies any current suicidal or homicidal ideations. She did report to having suicidal ideations prior to admission. Plan: No  Intent: No  Means: No   Homicidal Ideation: The patient adamantly denies any homicidal ideations today.  Plan: No  Intent: No.  Means: No   General Appearance /Behavior: Disheveled and tearful.  Eye Contact: Fair.  Speech: Appropriate in rate and volume.  Motor Behavior: Slightly slowed.  Level of Consciousness: AO x 3.  Mental Status: Alert and oriented x 3.  Mood: Severely depressed.  Affect: Flat and tearful.  Anxiety Level: Severe. Thought Process: Racing thoughts reported.  Thought  Content: The patient denies any auditory or visual hallucinations today as well as any delusional thinking.  She does state that at times she feels she has "a guardian angel" with her.  Perception: WNL.  Judgment: Fair.  Insight: Fair.  Cognition: Orientation time, place and person.   Treatment Plan Summary:  1. Daily contact with patient to assess and evaluate symptoms and progress in treatment  2. Medication management  3. The patient will deny suicidal ideations or homicidal ideations for 48 hours prior to discharge and have a depression and anxiety rating of 3 or less. The patient will also deny any auditory or visual hallucinations or delusional thinking.   Plan:  1. Restart current medications.  2. Continue to monitor.  3. Will d/c Valium and start Klonopin at 1 mgs po q am, 2 pm and 10 pm for anxiety. 4. Will allow the patient to sign for voluntary care and will remove from holding order.  SUICIDE RISK:   Moderate:  Frequent suicidal ideation with limited intensity, and duration, some specificity in terms of plans, no associated intent, good self-control, limited dysphoria/symptomatology, some risk factors present, and identifiable protective factors, including available and accessible social support.  Allison Bolton 06/27/2011, 2:36 PM

## 2011-06-27 NOTE — Tx Team (Signed)
Interdisciplinary Treatment Plan Update (Adult)  Date:  06/27/2011 Time Reviewed:  2:00 PM  Progress in Treatment: Attending groups: Yes. Participating in groups:  Yes. Taking medication as prescribed:  Yes. Tolerating medication:  Yes. Family/Significant othe contact made:  No, will contact:  just arrivred today Patient understands diagnosis:  Yes. Discussing patient identified problems/goals with staff:  Yes. Medical problems stabilized or resolved:  Yes. Denies suicidal/homicidal ideation: Yes. and As evidenced by:  pt report Issues/concerns per patient self-inventory:  No. Other:  New problem(s) identified: Yes, Describe:  no support system  Reason for Continuation of Hospitalization: Anxiety Depression Hallucinations Medication stabilization  Interventions implemented related to continuation of hospitalization:  Additional comments: no suppport system Estimated length of stay: 2-3 days  Discharge Plan: needs housing, CM will continue to assess  New goal(s):  Review of initial/current patient goals per problem list:   1.  Goal(s): eliminate AVH 48 hrs prior to d/c  Met:  No  Target date: d/c date  As evidenced by: pt report  2.  Goal (s): reduce depression  Met:  No  Target date: by d/c date  As evidenced by: depression rating of 3 or less  3.  Goal(s): reduce anxiety  Met:  Yes  Target date:by d/c date  As evidenced by: anxiety rating of 3 or less  4.  Goal(s):  Met:  No  Target date:  As evidenced by:  Attendees: Patient:  Allison Bolton 12/21/20122:00 PM  Family:   12/21/20122:00 PM  Physician:  Darryll Capers, MD 12/21/20122:00 PM  Nursing:   Fransisca Kaufmann 12/21/20122:00 PM  Case Manager:  Jeannette How, LCSW 12/21/20122:00 PM  Counselor:  Veto Kemps 12/21/20122:00 PM  Other:   12/21/20122:00 PM  Other:   12/21/20122:00 PM  Other:   12/21/20122:00 PM  Other:   12/21/20122:00 PM   Scribe for Treatment Team:   Jeannette How, 06/27/2011, 2:00 PM

## 2011-06-27 NOTE — Progress Notes (Signed)
Pt is visible on the unit and is pleasant during interactions. Has received prn medications today for anxiety and migraine. Appears depressed. On her self inventory rated depression/hopelessness at 10. Marked passive SI. However, clearly denied suicidal thoughts when questioned by Clinical research associate. Eating well and able to pay attention during groups. Energy level is normal. Remains safe on every fifteen minute safety checks.
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
## 2011-06-27 NOTE — Tx Team (Signed)
Initial Interdisciplinary Treatment Plan  PATIENT STRENGTHS: (choose at least two) General fund of knowledge Motivation for treatment/growth Religious Affiliation Supportive family/friends  PATIENT STRESSORS: Financial difficulties Legal issue loss child to dss on 19th   PROBLEM LIST: Problem List/Patient Goals Date to be addressed Date deferred Reason deferred Estimated date of resolution  Anxiety 12/20     Depression 12/20     Delusions per report 12/20     Increased risk for SI (recent SI) 12/20     Med Hx: meniere's, migraines, kidney stones, asthma, hx of mrsa  12/20                              DISCHARGE CRITERIA:  Ability to meet basic life and health needs Improved stabilization in mood, thinking, and/or behavior Motivation to continue treatment in a less acute level of care Verbal commitment to aftercare and medication compliance  PRELIMINARY DISCHARGE PLAN: Outpatient therapy  PATIENT/FAMIILY INVOLVEMENT: This treatment plan has been presented to and reviewed with the patient, Allison Bolton.  The patient and family have been given the opportunity to ask questions and make suggestions.  Kelsei Defino [place] 06/27/2011, 5:35 AM

## 2011-06-28 LAB — SEDIMENTATION RATE: Sed Rate: 19 mm/hr (ref 0–22)

## 2011-06-28 NOTE — Progress Notes (Signed)
[facility] Adult Inpatient Family/Significant Other Suicide Prevention Education  Suicide Prevention Education:  Patient Unable to Provide Contact Information for Family/Significant Other Suicide Prevention Education: The patient Allison Bolton did not refuse to provide written consent for family/significant other, but was unable to provide contact information at the time of conducting the psychosocial assessment in order to give Family/Significant Other Suicide Prevention Education during admission and/or prior to discharge.   Pt said that she will give therapist contact information of family/significant other when possible. Pt. accepted information on suicide prevention, warning signs to look for with suicide and crisis line numbers to use. The pt. agreed to call crisis line numbers if having warning signs or having thoughts of suicide.  Hedi Barkan 06/28/2011, 11:49 AM

## 2011-06-28 NOTE — Progress Notes (Signed)
[facility] Group Notes:  (Counselor/Nursing/MHT/Case Management/Adjunct)  06/28/2011 11 AM  Type of Therapy:  Group Therapy, Dance/Movement Therapy   Participation Level:  Active  Participation Quality:  Attentive, Sharing and Supportive  Affect:  Appropriate  Cognitive:  Appropriate  Insight:  Limited  Engagement in Group:  Limited  Engagement in Therapy:  Limited  Modes of Intervention:  Clarification, Problem-solving, Role-play, Socialization and Support  Summary of Progress/Problems: pt participated in a group discussion and experiential on how to release tension in the body, mind and heart. Pt stated that her hands were tense and shared a stretch to bring about calmness and allow the pt to feel more present in their body. Pt was encouraged to repeat this stretch and others during the group and at D/C.   Gevena Mart

## 2011-06-28 NOTE — Progress Notes (Signed)
Pt is pleasant on approach   And attends and participates in group therapy    She is anxious and somewhat depressed   Her hygiene is improved   Verbal support given  Medications administered and effectiveness monitored  Q 15 min checks   Pt safe at present

## 2011-06-28 NOTE — Progress Notes (Signed)
  Allison Bolton is a 40 y.o. female [id number] [birth year]  06/26/2011 Principal Problem:  *Bipolar I disorder, most recent episode (or current) depressed Active Problems:  Generalized anxiety disorder   Mental Status: alert and oriented,Denies SI/HI/AVH not having anxiety or depression at this time.   Subjective/Objective: Dressed active in [place]. Anxious to see 6yo daughter who was removed by CPS. Has mediation on Jan.4th.    Filed Vitals:   06/28/11 0652  BP: 123/91  Pulse: 93  Temp:   Resp:     Lab Results:   BMET    Component Value Date/Time   NA 137 06/26/2011 1452   K 3.6 06/26/2011 1452   CL 104 06/26/2011 1452   CO2 22 06/26/2011 1452   GLUCOSE 114* 06/26/2011 1452   BUN 11 06/26/2011 1452   CREATININE 0.58 06/26/2011 1452   CALCIUM 9.7 06/26/2011 1452   GFRNONAA >90 06/26/2011 1452   GFRAA >90 06/26/2011 1452    Medications:  Scheduled:     . celecoxib  200 mg Oral BH-qamhs  . clonazePAM  1 mg Oral BH-q8a2phs  . divalproex  500 mg Oral BH-qamhs  . DULoxetine  30 mg Oral Q1400  . DULoxetine  60 mg Oral Daily  . estrogens (conjugated)  0.625 mg Oral Daily  . pantoprazole  40 mg Oral QHS  . topiramate  100 mg Oral QHS  . DISCONTD: pantoprazole  40 mg Oral Q1200  . DISCONTD: topiramate  100 mg Oral Daily     PRN Meds acetaminophen, albuterol, alum & mag hydroxide-simeth, aspirin-acetaminophen-caffeine, magnesium hydroxide, meclizine, risperiDONE, DISCONTD: LORazepam, DISCONTD: traZODone  Plan: No med changes indicated today.           Continue current plan of care.   Allison Bolton,Allison Bolton. 06/28/2011

## 2011-06-29 LAB — T4, FREE: Free T4: 0.79 ng/dL — ABNORMAL LOW (ref 0.80–1.80)

## 2011-06-29 LAB — T3, FREE: T3, Free: 2.5 pg/mL (ref 2.3–4.2)

## 2011-06-29 MED ORDER — SUMATRIPTAN SUCCINATE 6 MG/0.5ML ~~LOC~~ SOLN
6.0000 mg | Freq: Once | SUBCUTANEOUS | Status: AC
  Administered 2011-06-29: 6 mg via SUBCUTANEOUS
  Filled 2011-06-29: qty 0.5

## 2011-06-29 NOTE — Progress Notes (Signed)
[facility] Group Notes:  (Counselor/Nursing/MHT/Case Management/Adjunct)  06/29/2011 11 AM  Type of Therapy:  Group Therapy, Dance/Movement Therapy   Participation Level:  Did Not Attend    [facility]

## 2011-06-29 NOTE — Progress Notes (Signed)
  Allison Bolton is a 40 y.o. female [id number] [birth year]  06/26/2011 Principal Problem:  *Bipolar I disorder, most recent episode (or current) depressed Active Problems:  Generalized anxiety disorder   Mental Status:reporting migraine although mood and affect do not match level of reported distress.Denies SI/HI/AVH. Some depression/anxiety about her situation.    Subjective/Objective:Up dressed c/o migraine -wants "shot " that she gets in ED. Last Migraine was treated with Imetrex. Can order. Otherwise just waiting until she can be with her 6yo daughter.    Filed Vitals:   06/28/11 0652  BP: 123/91  Pulse: 93  Temp:   Resp:     Lab Results:   BMET    Component Value Date/Time   NA 137 06/26/2011 1452   K 3.6 06/26/2011 1452   CL 104 06/26/2011 1452   CO2 22 06/26/2011 1452   GLUCOSE 114* 06/26/2011 1452   BUN 11 06/26/2011 1452   CREATININE 0.58 06/26/2011 1452   CALCIUM 9.7 06/26/2011 1452   GFRNONAA >90 06/26/2011 1452   GFRAA >90 06/26/2011 1452    Medications:  Scheduled:     . celecoxib  200 mg Oral BH-qamhs  . clonazePAM  1 mg Oral BH-q8a2phs  . divalproex  500 mg Oral BH-qamhs  . DULoxetine  30 mg Oral Q1400  . DULoxetine  60 mg Oral Daily  . estrogens (conjugated)  0.625 mg Oral Daily  . pantoprazole  40 mg Oral QHS  . SUMAtriptan  6 mg Subcutaneous Once  . topiramate  100 mg Oral QHS     PRN Meds acetaminophen, albuterol, alum & mag hydroxide-simeth, aspirin-acetaminophen-caffeine, magnesium hydroxide, meclizine, risperiDONE  Plan: Imetrex to abort migraine.          Continue curent plan of care.   Allison Bolton,MICKIE D. 06/29/2011

## 2011-06-29 NOTE — Progress Notes (Signed)
Pt continues with frequent somatic complaints and request for medication. Reminded use of Excedrin close to hs could disrupt her sleep which was an issue for her last night. Agreed she would take hs meds at earliest available and try to go without migraine meds at this time. Denies SI/HI/AVH. Edsel Petrin RN

## 2011-06-29 NOTE — Progress Notes (Signed)
Pt is anxious and somatically focused   She said she wants to go back to bed as she did not sleep well last night and her head hurts and she feels dizzy   She is otherwise cooperative and has appropriate interaction with others   She has not mentioned any other physical complaints other than the ones just mentioned   Verbal support given  Medications administered and effectiveness monitored   Q 15 min checks  Pt safe at present

## 2011-06-29 NOTE — Progress Notes (Signed)
Pt has complained of headache of 10 all evening. She states the Excedrin has helped "some" but the pain has returned. She is also complaining of foul vaginal odor. She describes her mood as a "10/10" (denying depression). She does ask and receives risperdal prn for anxiety. Pt given peri-care kit and hygiene encouraged. Also suggested pt speak with provider regarding concern tomorrow. Medicated per orders, Excedrin prn given at hs. On reassessment pt reports pain is a 5/10 and she is resting in bed. Denies SI/HI/AVH. Edsel Petrin RN

## 2011-06-30 MED ORDER — DIVALPROEX SODIUM ER 500 MG PO TB24
500.0000 mg | ORAL_TABLET | Freq: Every day | ORAL | Status: DC
  Administered 2011-07-01 – 2011-07-09 (×9): 500 mg via ORAL
  Filled 2011-06-30 (×6): qty 30
  Filled 2011-06-30: qty 1
  Filled 2011-06-30 (×2): qty 30

## 2011-06-30 MED ORDER — SUMATRIPTAN SUCCINATE 50 MG PO TABS
50.0000 mg | ORAL_TABLET | ORAL | Status: DC | PRN
  Administered 2011-06-30 – 2011-07-09 (×11): 50 mg via ORAL
  Filled 2011-06-30 (×9): qty 1

## 2011-06-30 MED ORDER — DIVALPROEX SODIUM ER 500 MG PO TB24
1000.0000 mg | ORAL_TABLET | Freq: Every day | ORAL | Status: DC
  Administered 2011-06-30 – 2011-07-08 (×9): 1000 mg via ORAL
  Filled 2011-06-30 (×10): qty 2

## 2011-06-30 NOTE — Progress Notes (Signed)
Pt was in hall upon first assessment.  She was given her self-inventory to fill out.  She rated her depression, hopelessness and anxiety all a 5 today.  She came to med window for am medications.  When she was given her celebrex she was asked her pain score which she rated a 4 for her arthritis.  At that time, she never mentioned her headache at all.  We did talk about migraines and she asked why she was taking Depakote.  Explained it was used as a mood stabilizer but was also good for migraines and seizure disorders.  Asked her then if she knew why she was prescribed and she stated,"no" Pt did talk about seeing an neurologist and he was still running test and trying different medications with her.  She did not respond when told about the depakote and never mentioned she was having a migraine at that time.   Around 1104, she asked for her migraine medication "excedrin"  She said that the night shift wouldn't give it to her because of the amount of caffeine in the pills.  She rated her headache a 9 at 1104 and before lunch she claimed it was still a 9 going on a 10.  She stated,"can't you order me something else the nurse did the other day did for me"  Informed her that only a doctor could order medications and she immediately asked was he still here.  Spoke with Dr. Allena Katz and at first he wanted her to have tylenol for pt is trying to get an injection but then realized the excedrin does have acetaminophen in it too.  He did order her some Imitrex po which was given around 1409 and after the f/u she rated it a 4.  She has remained in bed all afternoon and has not been up.  She claims she is trying to get her head to quite hurting so she plans to continue to lie in bed.  Pt does have meds ready on her chart for possible discharge tomorrow.  The pharmacist will not be here and Dr. Allena Katz feels pt may be ready to leave by tomorrow.
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
## 2011-06-30 NOTE — Progress Notes (Signed)
[facility] MD Progress Note  06/30/2011 12:43 PM  Diagnosis:  Axis I: Bipolar I Disorder - Most Recent Episode Depressed.  Generalized Anxiety Disorder.  The patient was seen today and reports the following:   ADL's: Intact  Sleep: The patient reports to sleeping well last night without difficulty.  Appetite: The patient reports a good appetite.   Mild>(1-10) >Severe  Hopelessness (1-10): 0  Depression (1-10): 3  Anxiety (1-10): 0   Suicidal Ideation: The patient adamantly denies any current suicidal or homicidal ideations.  Plan: No  Intent: No  Means: No   Homicidal Ideation: The patient adamantly denies any homicidal ideations today.  Plan: No  Intent: No.  Means: No   General Appearance /Behavior: Casual.  Eye Contact: Good.  Speech: Appropriate in rate and volume.  Motor Behavior: WNL.  Level of Consciousness: Alert and Oriented x 3.  Mental Status: Alert and oriented x 3.  Mood: Mildly Depressed.  Affect: Mildly Constricted.  Anxiety Level: None Reported or observed.  Thought Process: Appropriate today.  Thought Content: No auditory or visual hallucinations reported today.  Patient denies delusional thinking. Perception: WNL.  Judgment: Fair to Good.  Insight: Fair to Good.  Cognition: Orientation to time, place and person.  Sleep:  Number of Hours: 3.75   Vital Signs:Blood pressure 130/87, pulse 92, temperature 97.4 F (36.3 C), temperature source Oral, resp. rate 18.  Lab Results:  Results for orders placed during the hospital encounter of 06/26/11 (from the past 48 hour(s))  VALPROIC ACID LEVEL     Status: Abnormal   Collection Time   06/28/11  7:59 PM      Component Value Range Comment   Valproic Acid Lvl 48.4 (*) 50.0 - 100.0 (ug/mL)   T3, FREE     Status: Normal   Collection Time   06/28/11  7:59 PM      Component Value Range Comment   T3, Free 2.5  2.3 - 4.2 (pg/mL)   T4, FREE     Status: Abnormal   Collection Time   06/28/11  7:59 PM   Component Value Range Comment   Free T4 0.79 (*) 0.80 - 1.80 (ng/dL)   TSH     Status: Normal   Collection Time   06/28/11  7:59 PM      Component Value Range Comment   TSH 3.722  0.350 - 4.500 (uIU/mL)   SEDIMENTATION RATE     Status: Normal   Collection Time   06/28/11  7:59 PM      Component Value Range Comment   Sed Rate 19  0 - 22 (mm/hr)    Treatment Plan Summary:  1. Daily contact with patient to assess and evaluate symptoms and progress in treatment  2. Medication management  3. The patient will deny suicidal ideations or homicidal ideations for 48 hours prior to discharge and have a depression and anxiety rating of 3 or less. The patient will also deny any auditory or visual hallucinations or delusional thinking.   Plan:  1. Continue current medications.  2. Continue to monitor.  3. Will increase Depakote ER to 500 mgs po q am and 1000 mgs po qhs for further mood stabilization.   Allison Bolton 06/30/2011, 12:43 PM
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
## 2011-06-30 NOTE — Progress Notes (Signed)
Recreation Therapy Group Note  Date: 06/30/2011         Time: 0930      Group Topic/Focus: The focus of this group is on promoting emotional and psychological well-being through the process of creative expression, relaxation, socialization, fun and enjoyment.  Participation Level: Active  Participation Quality: Appropriate and Attentive  Affect: Appropriate  Cognitive: Oriented   Additional Comments: Patient disappointed she won't be with her children for Christmas.   Kizer Nobbe 06/30/2011 9:58 AM

## 2011-06-30 NOTE — Progress Notes (Signed)
In dayroom, watching TV and interacting appropriately with peers. Agreed to speak with Clinical research associate. Appears flat and depressed. A/Ox4. No acute distress noted. States she has had a good day. When asked to qualify "good day", states she had a migraine since last night, but got imitrex and now states she has no headache. Also states the milieu has been good today apart from recent peer decompensation. Support and encouragement provided. Otherwise no questions or concerns. Denies SI/HI/AVH and contracts for safety. Denies pain or discomfort. POC and medications for the shift reviewed and understanding verbalized. Safety has been maintained with Q21minute observation. Will continue current POC.

## 2011-06-30 NOTE — Progress Notes (Signed)
Pt requested SW to cancel pt's appointments, Dr. Olena Leatherwood at [facility] on Thursday [phone]) and Dr. Alona Bene at [facility] on Friday [phone]).  SW attempted to do so but offices were closed and need to be canceled on Wednesday when they are open again.    Reyes Ivan, LCSWA 06/30/2011  1:17 PM

## 2011-07-01 LAB — COMPREHENSIVE METABOLIC PANEL
ALT: 29 U/L (ref 0–35)
AST: 18 U/L (ref 0–37)
Alkaline Phosphatase: 63 U/L (ref 39–117)
CO2: 22 mEq/L (ref 19–32)
Chloride: 102 mEq/L (ref 96–112)
GFR calc Af Amer: >90 mL/min (ref >90)
GFR calc non Af Amer: >90 mL/min (ref >90)
Glucose, Bld: 117 mg/dL — ABNORMAL HIGH (ref 70–99)
Potassium: 3.6 mEq/L (ref 3.5–5.1)
Sodium: 135 mEq/L (ref 135–145)

## 2011-07-01 LAB — CBC
MCV: 84.9 fL (ref 78.0–100.0)
Platelets: 231 10*3/uL (ref 150–400)
RBC: 4.51 MIL/uL (ref 3.87–5.11)
RDW: 13 % (ref 11.5–15.5)
WBC: 9.3 10*3/uL (ref 4.0–10.5)

## 2011-07-01 LAB — DIFFERENTIAL
Basophils Absolute: 0.1 10*3/uL (ref 0.0–0.1)
Lymphocytes Relative: 36 % (ref 12–46)
Lymphs Abs: 3.3 10*3/uL (ref 0.7–4.0)
Neutro Abs: 4.8 10*3/uL (ref 1.7–7.7)
Neutrophils Relative %: 52 % (ref 43–77)

## 2011-07-01 MED ORDER — SUMATRIPTAN SUCCINATE 6 MG/0.5ML ~~LOC~~ SOLN
6.0000 mg | Freq: Once | SUBCUTANEOUS | Status: AC
  Administered 2011-07-01: 6 mg via SUBCUTANEOUS
  Filled 2011-07-01: qty 0.5

## 2011-07-01 NOTE — Progress Notes (Signed)
Patient ID: Allison Bolton, female   DOB: 02-25-1971, 40 y.o.   MRN: [id number]   Patient was lying on cold floor and using icepack on approach. Trying to get rid of a migraine headache that she has had a while today. Took imitrex earlier but has not relieved the pain. Physician called and an order for imitrex subcut. Given. Patient stated that she feels her mood has improved but is anxious about getting discharged son so she can see her son that she says is in foster care. Talked about how they make her feel like she is an unfit parent because she is on so much medications. Hoping for discharge tomorrow. Staff will monitor and encourage group attendance.

## 2011-07-01 NOTE — Discharge Planning (Signed)
Met with patient and doctor today.  Very focused on obligations with DSS re: daughter.  Has several appointments, one tomorrow, but also confirmed that she called to let them know she is still in hospital, and I told her I would call as well.  Will work with the folks at [employer]. At D/C to get housing and other support.  Follow up at [facility] in [place].  Kikely d/c in several days.

## 2011-07-01 NOTE — Progress Notes (Signed)
[facility] MD Progress Note  07/01/2011 1:52 PM  Diagnosis:  Axis I: Bipolar I Disorder - Most Recent Episode Depressed.  Generalized Anxiety Disorder.   The patient was seen today and reports the following:   ADL's: Intact  Sleep: The patient reports to sleeping well last night without difficulty.  Appetite: The patient reports a good appetite.   Mild>(1-10) >Severe  Hopelessness (1-10): 0  Depression (1-10): 0  Anxiety (1-10): 0   Suicidal Ideation: The patient adamantly denies any current suicidal or homicidal ideations.  Plan: No  Intent: No  Means: No   Homicidal Ideation: The patient adamantly denies any homicidal ideations today.  Plan: No  Intent: No.  Means: No   General Appearance /Behavior: Casual.  Eye Contact: Good.  Speech: Appropriate in rate and volume.  Motor Behavior: WNL.  Level of Consciousness: Alert and Oriented x 3.  Mental Status: Alert and oriented x 3.  Mood: Mildly Depressed.  Affect: Mildly Constricted.  Anxiety Level: None Reported or observed.  Thought Process: Appropriate today.  Thought Content: No auditory or visual hallucinations reported today. Patient denies delusional thinking.  Perception: WNL.  Judgment: Fair to Good.  Insight: Fair to Good.  Cognition: Orientation to time, place and person.  Sleep:  Number of Hours: 5.75   Vital Signs:Blood pressure 120/82, pulse 99, temperature 97.4 F (36.3 C), temperature source Oral, resp. rate 18.  Lab Results: No results found for this or any previous visit (from the past 48 hour(s)).  Treatment Plan Summary:  1. Daily contact with patient to assess and evaluate symptoms and progress in treatment  2. Medication management  3. The patient will deny suicidal ideations or homicidal ideations for 48 hours prior to discharge and have a depression and anxiety rating of 3 or less. The patient will also deny any auditory or visual hallucinations or delusional thinking.   Plan:  1. Continue  current medications.  2. Continue to monitor.  3. Possible discharge in the morning.  Allison Bolton 07/01/2011, 1:52 PM

## 2011-07-01 NOTE — Progress Notes (Signed)
Report received from E. Shamberg RN.  Ben MHT reported that [place] requested medication for sleep during shift report. After report writer entered patinets room and pt requested risperdal to aid with sleep. Pt received 0.5 mg of risperdal and reported that she takes this to help her sleep. Pt. Voiced no other complaints and returned to her room. Safety maintained on unit will continue to monitor.
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
## 2011-07-02 NOTE — Tx Team (Signed)
Interdisciplinary Treatment Plan Update (Adult)  Date:  07/02/2011  Time Reviewed: 9:39 AM   Progress in Treatment: Attending groups: Yes Participating in groups:  Yes Taking medication as prescribed: Yes Tolerating medication:  Yes Family/Significant othe contact made:   Patient understands diagnosis:  Yes Discussing patient identified problems/goals with staff:  Yes Medical problems stabilized or resolved:  Yes Denies suicidal/homicidal ideation: Yes Issues/concerns per patient self-inventory:  None identified Other:  New problem(s) identified: Pt concerned about loosing her daughter  Reason for Continuation of Hospitalization: Pt is a placement issue  Interventions implemented related to continuation of hospitalization: Medication monitoring and adjustment, safety checks q 15 mins, group therapy, psychoeducation for coping skills, collateral contact and discharge planning.   Additional comments:  Estimated length of stay: 0-1  Discharge Plan: Pt plans to discharge to [facility] And follow up with [facility] in [facility].  New goal(s): N/A  Review of initial/current patient goals per problem list:   1.  Goal(s): Eliminate AVH 48 hrs. Prior to discharge   Met: yes  Target date: discharge date  As evidenced by: Not having any AVH  2.  Goal (s): Reduce Depression  Met: yes  Target date: discharge  As evidenced by: Rating depression 3 or less  3.  Goal(s): Reduce anxiety   Met:  yes  Target date: discharge  As evidenced by: Pt rated depression at 0  4.  Goal(s):  Met:    Target date:  As evidenced by:    Attendees: Patient:  Allison Bolton    Family:     Physician:  Harvie Heck Readling,MD  07/02/2011 9:39 AM   Nursing:   Robbie Louis, RN 07/02/2011 9:39 AM   Case Manager:  Vanetta Mulders, LPCA 07/02/2011 9:39 AM   Counselor:   07/02/2011 9:39 AM   Other:  Lynann Bologna, NP   Other:     Other:     Other:      Scribe for Treatment Team:   Purcell Nails, LPCA 07/02/2011 9:39 AM
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
## 2011-07-02 NOTE — Progress Notes (Signed)
Patient ID: Allison Bolton, female   DOB: [birth year], 40 y.o.   MRN: [id number] Pt is awake and active on the unit this AM. Pt denies SI/HI and A/V hallucinations. Pt is attending groups and is cooperative with staff. Pt was hoping to d/c today but here aftercare arraignments fell through as well as other affairs which need to be addressed. Pt is sad about staying but she agrees that it would be best if everything were in place before leaving. Writer will continue to monitor.

## 2011-07-02 NOTE — Progress Notes (Signed)
[facility] MD Progress Note  07/02/2011 1:26 PM  Diagnosis:  Axis I: Bipolar I Disorder - Most Recent Episode Depressed.  Generalized Anxiety Disorder.   The patient was seen today and reports the following:   ADL's: Intact  Sleep: The patient reports to continuing to sleep well last night without difficulty.  Appetite: The patient reports a good appetite.   Mild>(1-10) >Severe  Hopelessness (1-10): 0  Depression (1-10): 0  Anxiety (1-10): 0   Suicidal Ideation: The patient adamantly denies any current suicidal or homicidal ideations.  Plan: No  Intent: No  Means: No   Homicidal Ideation: The patient adamantly denies any homicidal ideations today.  Plan: No  Intent: No.  Means: No   General Appearance /Behavior: Casual.  Eye Contact: Good.  Speech: Appropriate in rate and volume.  Motor Behavior: WNL.  Level of Consciousness: Alert and Oriented x 3.  Mental Status: Alert and oriented x 3.  Mood: Euthymic.  Affect: Mildly Constricted.  Anxiety Level: None Reported or observed.  Thought Process: Remains appropriate today.  Thought Content: No auditory or visual hallucinations reported today. Patient denies delusional thinking.  Perception: WNL.  Judgment: Fair to Good.  Insight: Fair to Good.  Cognition: Orientation to time, place and person.  Sleep:  Number of Hours: 5.75   Vital Signs:Blood pressure 107/82, pulse 105, temperature 97.4 F (36.3 C), temperature source Oral, resp. rate 18.  Lab Results:  Results for orders placed during the hospital encounter of 06/26/11 (from the past 48 hour(s))  COMPREHENSIVE METABOLIC PANEL     Status: Abnormal   Collection Time   07/01/11  7:10 PM      Component Value Range Comment   Sodium 135  135 - 145 (mEq/L)    Potassium 3.6  3.5 - 5.1 (mEq/L)    Chloride 102  96 - 112 (mEq/L)    CO2 22  19 - 32 (mEq/L)    Glucose, Bld 117 (*) 70 - 99 (mg/dL)    BUN 16  6 - 23 (mg/dL)    Creatinine, Ser 2.95  0.50 - 1.10 (mg/dL)    Calcium 9.6  8.4 - 10.5 (mg/dL)    Total Protein 7.1  6.0 - 8.3 (g/dL)    Albumin 3.3 (*) 3.5 - 5.2 (g/dL)    AST 18  0 - 37 (U/L)    ALT 29  0 - 35 (U/L)    Alkaline Phosphatase 63  39 - 117 (U/L)    Total Bilirubin 0.1 (*) 0.3 - 1.2 (mg/dL)    GFR calc non Af Amer >90  >90 (mL/min)    GFR calc Af Amer >90  >90 (mL/min)   VALPROIC ACID LEVEL     Status: Normal   Collection Time   07/01/11  7:10 PM      Component Value Range Comment   Valproic Acid Lvl 62.4  50.0 - 100.0 (ug/mL)   CBC     Status: Normal   Collection Time   07/01/11  7:30 PM      Component Value Range Comment   WBC 9.3  4.0 - 10.5 (K/uL)    RBC 4.51  3.87 - 5.11 (MIL/uL)    Hemoglobin 13.1  12.0 - 15.0 (g/dL)    HCT 62.1  30.8 - 65.7 (%)    MCV 84.9  78.0 - 100.0 (fL)    MCH 29.0  26.0 - 34.0 (pg)    MCHC 34.2  30.0 - 36.0 (g/dL)    RDW 13.0  11.5 - 15.5 (%)    Platelets 231  150 - 400 (K/uL)   DIFFERENTIAL     Status: Normal   Collection Time   07/01/11  7:30 PM      Component Value Range Comment   Neutrophils Relative 52  43 - 77 (%)    Neutro Abs 4.8  1.7 - 7.7 (K/uL)    Lymphocytes Relative 36  12 - 46 (%)    Lymphs Abs 3.3  0.7 - 4.0 (K/uL)    Monocytes Relative 8  3 - 12 (%)    Monocytes Absolute 0.7  0.1 - 1.0 (K/uL)    Eosinophils Relative 4  0 - 5 (%)    Eosinophils Absolute 0.4  0.0 - 0.7 (K/uL)    Basophils Relative 1  0 - 1 (%)    Basophils Absolute 0.1  0.0 - 0.1 (K/uL)    It was discovered today that the patient is homeless and has no discharge plan.  The Case Manager will begin the process of finding appropriate placement for the patient.  Treatment Plan Summary:  1. Daily contact with patient to assess and evaluate symptoms and progress in treatment  2. Medication management  3. The patient will deny suicidal ideations or homicidal ideations for 48 hours prior to discharge and have a depression and anxiety rating of 3 or less. The patient will also deny any auditory or visual  hallucinations or delusional thinking.   Plan:  1. Continue current medications.  2. Continue to monitor.  3. Discharge when placement arranged.  Allison Bolton 07/02/2011, 1:26 PM

## 2011-07-03 LAB — URINALYSIS, ROUTINE W REFLEX MICROSCOPIC
Bilirubin Urine: NEGATIVE
Glucose, UA: NEGATIVE mg/dL
Hgb urine dipstick: NEGATIVE
Protein, ur: NEGATIVE mg/dL
Urobilinogen, UA: 0.2 mg/dL (ref 0.0–1.0)

## 2011-07-03 NOTE — Discharge Planning (Signed)
Patient did not attend Aftercare Planning Group.  Case Manager met with her individually, introduced self.  She had no case management needs today.  Ambrose Mantle, LCSW 07/03/2011, 5:09 PM
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
## 2011-07-03 NOTE — Progress Notes (Signed)
Patient ID: Allison Bolton, female   DOB: 09-16-1970, 40 y.o.   MRN: [id number] Pt is awake and active on the unit this AM. Pt denies SI/HI and AVH. Pt is attending groups and is cooperative with staff. Pt mood and affect are somewhat depressed today due to uncertainty about her after care options. Pt did c/o pain with urination and abdominal cramping. Lab tests were completed for evaluation. Pt thought process seems delusional. She claims to have many wealthy friends that work in health care who would be willing to adopt her child and to help her in various ways financially. However, pt is participating in the milieu. Writer will continue to monitor.
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
## 2011-07-03 NOTE — Progress Notes (Signed)
Patient ID: Allison Bolton, female   DOB: 1971/02/14, 40 y.o.   MRN: [id number]  Reports two months of urinary frequency, mild dysuria, and presence of a 'fishy' odor.  Afebrile, No history of kidney infection.    Will check UA for starters and go from there.

## 2011-07-04 DIAGNOSIS — F315 Bipolar disorder, current episode depressed, severe, with psychotic features: Secondary | ICD-10-CM

## 2011-07-04 MED ORDER — METRONIDAZOLE 0.75 % VA GEL
1.0000 | VAGINAL | Status: AC
  Administered 2011-07-04 – 2011-07-08 (×7): 1 via VAGINAL
  Filled 2011-07-04: qty 70

## 2011-07-04 NOTE — Progress Notes (Signed)
[facility] Group Notes:  (Counselor/Nursing/MHT/Case Management/Adjunct)  07/04/2011 2:21 PM  Type of Therapy:  Group Therapy  Participation Level:  Minimal  Participation Quality:  Attentive and Sharing  Affect:  Depressed  Cognitive:  Oriented  Insight:  Limited  Engagement in Group:  Limited  Engagement in Therapy:  Limited  Modes of Intervention:  Clarification, Education and Support  Summary of Progress/Problems: Patient stated that she had a migraine and wanted to leave group early. Wants to be discharged and stated that she has a lot to get done. Unsure where she will go when discharged.   Ashland Osmer, Aram Beecham 07/04/2011, 2:21 PM
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
## 2011-07-04 NOTE — Progress Notes (Signed)
Recreation Therapy Group Note  Date: 07/04/2011         Time: 1315       Group Topic/Focus: The focus of the group is on enhancing the patients' ability to cope with stressors by understanding what coping is, why it is important, the negative effects of stress and developing healthier coping skills. Patients practice [place] and discuss how exercise can be used as a healthy coping strategy.  Participation Level: Minimal  Participation Quality: Resistant  Affect: Irritable  Cognitive: Oriented   Additional Comments: Patient refused to participate, reporting she has Meniere's Disease. Patient provided with modified activities that could be done seated, still refused. Patient was in and out of group, sighing often.   Florance Paolillo 07/04/2011 2:03 PM
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
## 2011-07-04 NOTE — Progress Notes (Signed)
Patient ID: Allison Bolton, female   DOB: [birth year], 40 y.o.   MRN: [id number] 07/04/2011 Nursing 1700 [name] is seen OOB UAL on the 400 hall, tolerated fair. SHe is pleasant and cooperative . Somatically focused. She is not able to give this Clinical research associate much info...about her DC plans...when this question is asked of her. SHe requested and was given an imitrex " for my migraine headache", and reprots her pain has " almost" all gone away.  A She completed her self invenotry sheet, on it she wrote she was negative for HI and SI, she rated her feelings of depression and hopelessness " o and o " and stated her DC plan was to take her medications correctly in the future.  R Safety is maintained and POCincludes continuing to research and look into what resources pt has available to hwer for DC. PD RN [facility]

## 2011-07-04 NOTE — Progress Notes (Signed)
Pt has spent most of this shift in bed   She appears depressed and is somatically focused    She has not been interacting with others and is isolative  Verbal support given  Medications administered and effectiveness monitored    Q 15 min checks  Pt safe at present

## 2011-07-05 LAB — GC/CHLAMYDIA PROBE AMP, URINE
Chlamydia, Swab/Urine, PCR: NEGATIVE
GC Probe Amp, Urine: NEGATIVE

## 2011-07-05 NOTE — Progress Notes (Signed)
Patient ID: Allison Bolton, female   DOB: 1971/04/03, 40 y.o.   MRN: [id number] Pt. In bed eyes closed, no distress noted, resp. Even, unlabored. Staff will continue to monitor q34min for safety.

## 2011-07-05 NOTE — Progress Notes (Signed)
Pt has been up and has been participating in milieu activities throughout the day today, pt had complaint of sore throat today and strep test was performed. Pt also complained of hearing her daughter's voice and has stated that has not happened in a long while, pt did mention that she is suppose to go to a hearing next week in regards to her daughter and is anxious about that, pt has received all medications without incident, will continue to monitor

## 2011-07-05 NOTE — Progress Notes (Signed)
Patient ID: Allison Bolton, female   DOB: July 06, 1971, 40 y.o.   MRN: [id number] Allison Bolton is a 40 y.o. female [id number] 05-23-1971   Diagnosis:  Axis I: Psychotic Disorder NOS  Vital Signs:Blood pressure 112/76, pulse 98, temperature 97.3 F (36.3 C), temperature source Oral, resp. rate 20.  Subjective/Objective: Pt. Complains of sore throat today with sweats and chills. She recalls that she did have a flu shot this year. Reports her throat started hurting last night, and that it is difficult to swallow food. She also reports a productive cough and states that both of her ears hurt.  Mental Status: Pt is resting in bed. General Appearance: Disheveled Behavior: Cooperative, Eye Contact:  Good Motor Behavior:  Normal Speech:  Regular rate and rhythm, normal volume, coherent and goal directed. Level of Consciousness:  Alert Mood:  Euthymic Affect:  Appropriate Anxiety Level:  Minimal Thought Process:  Linear and organized. Thought Content:  No auditory or visual hallucinations. No evidence of psychosis. Perception:  Normal Judgment:  Good Insight:  Present Cognition:  Orientation time, place and person Sleep:  Number of Hours: 5.75  Appetite: normal  Lab Results: will order a throat culture to rule out strep.  Medications Scheduled:  . celecoxib  200 mg Oral BH-qamhs  . clonazePAM  1 mg Oral [id number]  . divalproex  1,000 mg Oral QHS  . divalproex  500 mg Oral Daily  . DULoxetine  30 mg Oral Q1400  . DULoxetine  60 mg Oral Daily  . estrogens (conjugated)  0.625 mg Oral Daily  . metroNIDAZOLE  1 Applicatorful Vaginal BH-qamhs  . pantoprazole  40 mg Oral QHS  . topiramate  100 mg Oral QHS     PRN Meds acetaminophen, albuterol, alum & mag hydroxide-simeth, aspirin-acetaminophen-caffeine, magnesium hydroxide, meclizine, risperiDONE, SUMAtriptan.  Assessment/Plan: Continue current plan of care with no changes at this time.  Throat culture will be ordered.  Rona Ravens. Areg Bialas PAC 07/05/2011   +-

## 2011-07-05 NOTE — Progress Notes (Signed)
Found pt resting in bed. Pt is pleasant and appropriate. She reports relief from [facility] after prn risperdal. She also states anxiety is improved. At present she denies pain or problems though has complained of sore throat earlier today. Rapid strep results pending. Pt supported, encouraged. She denies SI/HI/VH and is safe. Edsel Petrin RN

## 2011-07-05 NOTE — Progress Notes (Signed)
[facility] Group Notes:  (Counselor/Nursing/MHT/Case Management/Adjunct)  07/05/2011 2:42 PM  Type of Therapy:  Counseling/Aftercare  Participation Level:  Active  Participation Quality:  Appropriate  Affect:  Appropriate  Cognitive:  Appropriate  Insight:  Good  Engagement in Group:  Good  Engagement in Therapy:  Good  Modes of Intervention:  Clarification and Support  Summary of Progress/Problems: Pt. Participated in after care planning group and was given [place] [facility] information and crisis and hotline numbers. Pt. agreed to use them if needed. The pt. Spoke about being at [facility] due to not taking her medications for Bipolar. The pt. Spoke about having a sore throat and wanting to see the doctor. The pt. stated she would try to work on staying healthy by taking her medications as she is supposed to.   Allison Bolton [place] 07/05/2011, 2:42 PM

## 2011-07-06 NOTE — Progress Notes (Signed)
Found pt in dayroom where she is interacting with peer. She is pleasant and smiling though reports struggling with racing thoughts. States risperdal helps tremendously and in turn reduces headache since thinking/tension is reduced. Currently denies pain. Encouraged to continue use of risperdal prn and educated on next available dose. Pt states she wishes to take a dose with hs meds to ensure restful sleep. She denies SI/HI/AVH and remains safe. Currently attending group. Edsel Petrin RN

## 2011-07-06 NOTE — Progress Notes (Signed)
Pt has been up and has been active intermittently during the day today, often complained of migraine headache and feelings of dizziness and often needed to go lie down in bed to rest. Pt did mention that she was excited about being able to see her daughter in a few days and did participate when able to, pt did receive medications without incident, will continue to monitor

## 2011-07-06 NOTE — Progress Notes (Addendum)
  Allison Bolton is a 40 y.o. female [id number] 1971-03-21  06/26/2011 Principal Problem:  *Bipolar I disorder, most recent episode (or current) depressed Active Problems:  Generalized anxiety disorder   Mental Status: Alert oriented mood and affect euthymic. Denies SI/Hi/AVH    Subjective/Objective: Dressed active in [place] wonders how she will get into her home as husband got the key when he was here. Has appropriate anticapatory excitement about seeing her daughter on the 3rd.Ready to see the neurologist for treatment of her headaches -[facility]. Labs did not show sore throat.   Filed Vitals:   07/06/11 0814  BP: 131/84  Pulse: 87  Temp:   Resp:     Lab Results:   BMET    Component Value Date/Time   NA 135 07/01/2011 1910   K 3.6 07/01/2011 1910   CL 102 07/01/2011 1910   CO2 22 07/01/2011 1910   GLUCOSE 117* 07/01/2011 1910   BUN 16 07/01/2011 1910   CREATININE 0.73 07/01/2011 1910   CALCIUM 9.6 07/01/2011 1910   GFRNONAA >90 07/01/2011 1910   GFRAA >90 07/01/2011 1910    Medications:  Scheduled:     . celecoxib  200 mg Oral BH-qamhs  . clonazePAM  1 mg Oral [id number]  . divalproex  1,000 mg Oral QHS  . divalproex  500 mg Oral Daily  . DULoxetine  30 mg Oral Q1400  . DULoxetine  60 mg Oral Daily  . estrogens (conjugated)  0.625 mg Oral Daily  . metroNIDAZOLE  1 Applicatorful Vaginal BH-qamhs  . pantoprazole  40 mg Oral QHS  . topiramate  100 mg Oral QHS     PRN Meds acetaminophen, albuterol, alum & mag hydroxide-simeth, aspirin-acetaminophen-caffeine, magnesium hydroxide, meclizine, risperiDONE, SUMAtriptan  Continue current plan of care.  Keshan Reha,MICKIE D. 07/06/2011
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
## 2011-07-07 NOTE — Progress Notes (Signed)
Patient ID: Allison Bolton, female   DOB: 1971-05-14, 40 y.o.   MRN: [id number] Pt. Lying in bed, reports headache at "9" out of 10. Writer tells pts. Of plans to  review meds and administer appropriately.  Pt. In agreement. Staff will continue to monitor q63min for safety.
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
## 2011-07-07 NOTE — Progress Notes (Signed)
Recreation Therapy Group Note  Date: 07/07/2011         Time: 0930      Group Topic/Focus: The focus of this group is on discussing various styles of communication and communicating assertively using 'I' (feeling) statements.  Participation Level: Minimal  Participation Quality: Resistant  Affect: Irritable  Cognitive: Oriented   Additional Comments: Patient continues to be in and out of group, not focused on the activity, then complains when she doesn't understand or isn't in the room when a concept is discussed.  Myrtha Tonkovich 07/07/2011 12:37 PM
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
## 2011-07-08 MED ORDER — TRAZODONE HCL 50 MG PO TABS
50.0000 mg | ORAL_TABLET | Freq: Every day | ORAL | Status: DC
  Administered 2011-07-08: 50 mg via ORAL
  Filled 2011-07-08 (×2): qty 1

## 2011-07-08 NOTE — Discharge Planning (Signed)
Met with patient in Aftercare Planning Group.   She asked repeatedly about going to an Assisted Living Facility and Case Manager explained that she is currently not eligible due to not having a disability check, but she remained focused on this idea.  RN was able to intercede and help her to understand that her focus at this time needs to be finding a place to stay in the interim.  Patient did call the 2 resources given to her yesterday, but they were not open due to the holiday.  Case Manager explained to her that she will likely be unable to attend her visitation tomorrow with her daughter due to not yet having placement.  Also explained that her regular CPS worker will return tomorrow and we will talk about the situation with him directly.  No case management needs today.  Ambrose Mantle, LCSW 07/08/2011, 9:51 AM

## 2011-07-08 NOTE — Progress Notes (Addendum)
Pt was up in her room upon first assessment.  She was given her self-inventory to fill out.  She rated everything a 0 today.  She denied any S/I or A/V hallucinations.  She is focused on going to an ALF for she knows a peer just left from here and was allowed to go to ALF without his disability being approved yet.  We tried to explain to her that her circumstances were different than his and she needed to focus right now on finding a place to live until she has some type of income.  She finally voiced understanding.  She has not asked for any prn meds thus far today but was asking for something for sleep tonight.  Encouraged her to tell the doctor today when she speaks with him.  She stated,"well, I already have" we then talked about how she would not be receiving night meds until bedtime.  She laughed and stated,"I know I just wanted to tell you" and walked away.  She has called 2 places given to her in [facility] but there is no one there to talk with her due to the holiday.  She plans to try again tomorrow.  Dr. Allena Katz to start pt on trazodone to start tonight at bedtime.  Informed pt and she voiced understanding.

## 2011-07-08 NOTE — Progress Notes (Signed)
[facility] MD Progress Note  07/08/2011 12:23 PM  Diagnosis:  Axis I: Bipolar I Disorder - Most Recent Episode Depressed.  Generalized Anxiety Disorder.   The patient was seen today and reports the following:   ADL's: Intact  Sleep: The patient reports to having some difficulty initiating sleep last night but can sleep once she gets to sleep.  Appetite: The patient reports a good appetite.   Mild>(1-10) >Severe  Hopelessness (1-10): 0  Depression (1-10): 0  Anxiety (1-10): 3   Suicidal Ideation: The patient adamantly denies any current suicidal or homicidal ideations.  Plan: No  Intent: No  Means: No   Homicidal Ideation: The patient adamantly denies any homicidal ideations today.  Plan: No  Intent: No.  Means: No   General Appearance /Behavior: Casual.  Eye Contact: Good.  Speech: Appropriate in rate and volume.  Motor Behavior: WNL.  Level of Consciousness: Alert and Oriented x 3.  Mental Status: Alert and oriented x 3.  Mood: Euthymic.  Affect: Mildly Constricted.  Anxiety Level: Mild today.  Thought Process: Remains appropriate today.  Thought Content: No auditory or visual hallucinations reported today. Patient denies delusional thinking.  Perception: WNL.  Judgment: Fair to Good.  Insight: Fair to Good.  Cognition: Orientation to time, place and person.  Sleep:  Number of Hours: 6.5   Vital Signs:Blood pressure 124/87, pulse 91, temperature 96.9 F (36.1 C), temperature source Oral, resp. rate 20.  Lab Results: No results found for this or any previous visit (from the past 48 hour(s)).  Treatment Plan Summary:  1. Daily contact with patient to assess and evaluate symptoms and progress in treatment  2. Medication management  3. The patient will deny suicidal ideations or homicidal ideations for 48 hours prior to discharge and have a depression and anxiety rating of 3 or less. The patient will also deny any auditory or visual hallucinations or delusional thinking.    Plan:  1. Continue current medications.  2. Continue to monitor.  3. Will add Trazodone 50 mgs po qhs for sleep. 4. Discharge when placement arranged.  Allison Bolton 07/08/2011, 12:23 PM
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
## 2011-07-09 MED ORDER — DULOXETINE HCL 30 MG PO CPEP: ORAL_CAPSULE | ORAL | Status: DC

## 2011-07-09 MED ORDER — TRAZODONE HCL 50 MG PO TABS: 50.0000 mg | ORAL_TABLET | Freq: Every day | ORAL | Status: DC

## 2011-07-09 MED ORDER — OMEPRAZOLE 20 MG PO CPDR
20.0000 mg | DELAYED_RELEASE_CAPSULE | ORAL | Status: DC
  Filled 2011-07-09: qty 3

## 2011-07-09 MED ORDER — TOPIRAMATE 100 MG PO TABS: 100.0000 mg | ORAL_TABLET | Freq: Every day | ORAL | Status: DC

## 2011-07-09 MED ORDER — DULOXETINE HCL 30 MG PO CPEP: 30.0000 mg | ORAL_CAPSULE | Freq: Every day | ORAL | Status: DC

## 2011-07-09 MED ORDER — CLONAZEPAM 1 MG PO TABS: 1.0000 mg | ORAL_TABLET | ORAL | Status: AC

## 2011-07-09 MED ORDER — DIVALPROEX SODIUM ER 500 MG PO TB24: ORAL_TABLET | ORAL | Status: DC

## 2011-07-09 MED ORDER — DIVALPROEX SODIUM ER 500 MG PO TB24: 500.0000 mg | ORAL_TABLET | Freq: Every day | ORAL | Status: DC

## 2011-07-09 MED ORDER — RISPERIDONE 0.5 MG PO TABS: 0.5000 mg | ORAL_TABLET | Freq: Four times a day (QID) | ORAL | Status: DC | PRN

## 2011-07-09 NOTE — Progress Notes (Signed)
Pt d/c from hospital with friend. All items returned. D/C instructions given, prescriptions given and samples given. Pt denies si and hi.

## 2011-07-09 NOTE — Progress Notes (Signed)
Suicide Risk Assessment  Discharge Assessment     Demographic factors:  Assessment Details Time of Assessment: Admission Information Obtained From: Patient Current Mental Status:  AO x 3. Risk Reduction Factors:  Risk Reduction Factors: Positive social support  CLINICAL FACTORS:   Severe Anxiety and/or Agitation Bipolar Disorder:   Mixed State Depression:   Anhedonia Insomnia More than one psychiatric diagnosis Previous Psychiatric Diagnoses and Treatments  COGNITIVE FEATURES THAT CONTRIBUTE TO RISK:  None Noted.   Diagnosis:  Axis I: Bipolar I Disorder - Most Recent Episode Depressed.  Generalized Anxiety Disorder.   The patient was seen today and reports the following:   ADL's: Intact  Sleep: The patient reports to sleeping well last night with no difficulty.  Appetite: The patient reports a good appetite.   Mild>(1-10) >Severe  Hopelessness (1-10): 0  Depression (1-10): 0  Anxiety (1-10): 0   Suicidal Ideation: The patient adamantly denies any current suicidal ideations.  Plan: No  Intent: No  Means: No   Homicidal Ideation: The patient adamantly denies any homicidal ideations today.  Plan: No  Intent: No.  Means: No   General Appearance /Behavior: Casual.  Eye Contact: Good.  Speech: Appropriate in rate and volume.  Motor Behavior: WNL.  Level of Consciousness: Alert and Oriented x 3.  Mental Status: Alert and oriented x 3.  Mood: Euthymic.  Affect: Mildly Constricted.  Anxiety Level: None Reported.  Thought Process: Remains appropriate today.  Thought Content: No auditory or visual hallucinations reported today. Patient denies delusional thinking.  Perception: WNL.  Judgment: Fair to Good.  Insight: Fair to Good.  Cognition: Orientation to time, place and person.   Treatment Plan Summary:  1. Daily contact with patient to assess and evaluate symptoms and progress in treatment  2. Medication management  3. The patient will deny suicidal ideations  or homicidal ideations for 48 hours prior to discharge and have a depression and anxiety rating of 3 or less. The patient will also deny any auditory or visual hallucinations or delusional thinking.   Plan:  1. Continue current medications.  2. Continue to monitor.  3. Discharge today to outpatient follow-up.  SUICIDE RISK:   Minimal: No identifiable suicidal ideation.  Patients presenting with no risk factors but with morbid ruminations; may be classified as minimal risk based on the severity of the depressive symptoms  Allison Bolton 07/09/2011, 11:19 AM

## 2011-07-09 NOTE — Tx Team (Signed)
Interdisciplinary Treatment Plan Update (Adult)  Date:  07/09/2011  Time Reviewed:  10:22 AM   Progress in Treatment: Attending groups:  Yes Participating in groups:    Yes,, fully engaged Taking medication as prescribed:    Yes, no refusals Tolerating medication:   Yes, no side effects reported by patient or noted by staff Family/Significant other contact made:  Yes, with friend where she will go stay Patient understands diagnosis:   Yes Discussing patient identified problems/goals with staff:   Yes, full engagement  Medical problems stabilized or resolved:   Yes Denies suicidal/homicidal ideation:  Yes Issues/concerns per patient self-inventory:   None  Other:  New problem(s) identified: No, Describe:    Reason for Continuation of Hospitalization: None  Interventions implemented related to continuation of hospitalization:  Medication monitoring and adjustment, safety checks Q15 min., group therapy, psychoeducation, collateral contact - until discharge  Additional comments:  Not applicable  Estimated length of stay:  Discharge today  Discharge Plan:  Will live with friend [name], who will come pick up; follow up will be with [facility]  New goal(s):  Not applicable  Review of initial/current patient goals per problem list:   1.  Goal(s):  Deny SI for 48 hours prior to D/C  Met:  Yes  Target date:  By Discharge   As evidenced by:  Has denied for some time  2.  Goal(s):  Reduce psychotic symptoms to baseline  Met:  Yes  Target date:  By Discharge   As evidenced by:  No psychotic symptoms noted  3.  Goal(s):  Reduce anxiety from 10 to 3.  Met:  Yes  Target date:  By Discharge   As evidenced by:  Denies anxiety.  4.  Goal(s):  Reduce depression from 10 to 3.  Met:  Yes  Target date:  By Discharge   As evidenced by:  Denies depression.  5.  Goal(s):  Decide on placement at discharge.  Met:  Yes  Target date:  By Discharge   As evidenced by:  Will go  to friend's house  Attendees: Patient:  Allison Bolton  07/09/2011   Family:     Physician:  Dr. Harvie Heck Readling 07/09/2011   Nursing:   Robbie Louis, RN 07/09/2011     Case Manager:  Ambrose Mantle, LCSW 07/09/2011    Counselor:  Veto Kemps, MT-BC 07/09/2011    Other:      Other:      Other:      Other:       Scribe for Treatment Team:   [name], 07/09/2011, 10:22 AM

## 2011-07-09 NOTE — Progress Notes (Signed)
[facility] Case Management Discharge Plan:  Will you be returning to the same living situation after discharge: No.  Going to friend's house Would you like a referral for services when you are discharged:Yes,  back to [facility] Do you have access to transportation at discharge:Yes,  friend to pick up Do you have the ability to pay for your medications:Yes,  Insurance  Interagency Information:   Release of Information completed for patient to sign at discharge.  Patient to Follow up at:  Follow-up Information    Follow up with Arna Medici on 07/11/2011. (Walk in at 8:00 am)    Contact information:   [address] [place], [place] [address] [phone]         Patient denies SI/HI:   Yes,      Safety Planning and Suicide Prevention discussed:  Yes,  During Aftercare Planning Group, Case Manager provided psychoeducation on "Suicide Prevention Information."  This included descriptions of risk factors for suicide, warning signs that an individual is in crisis and thinking of suicide, and what to do if this occurs.  Pt indicated understanding of information provided, and will read brochure given upon discharge.     Barrier to discharge identified:No.  Summary and Recommendations:  There are no remaining barriers to discharge.  Allison Bolton 07/09/2011, 11:43 AM

## 2011-07-09 NOTE — Discharge Summary (Signed)
Physician Discharge Summary Note  Patient:  Allison Bolton is an 41 y.o., female DOB:  1971/01/10  Date of Admission:  06/26/2011  Date of Discharge:  07/09/2011  Axis Diagnosis:  Axis I: Bipolar, Depressed  Level of Care:  Long-term IP psych.  Discharge destination:  Home  HPI: Pt. Was admitted due to increasing symptoms of depression with loss of ADLs and worsening mood. Diagnosis on Discharge:  Axis I: Bipolar, Depressed  Hospital course:  She was seen and evaluated by the treatment team including the Psychiatrist, PA,Nurses,Social Worker, Sports coach, and Counselor. She was an active participant in the treatment and discharge planning. The treatment included medication management, health assessment, recovery programming through daily groups, counseling, and discharge follow up planning. Health problems were identified and treated appropriately. Her symptoms improved consistently during her stay on the unit.  Pt. Was monitored daily for SI/HI as well as symptoms of psychosis.  Her depression improved as indicated by her daily self assessments. Her sleep and appetite were improved as well as ADLs.  At the time of discharge the patient was in improved condition and stable. For MSE: See SRA form completed by MD.  Labs: No new labs were ordered.  Is patient on multiple antipsychotic therapies at discharge:  No    Has Patient had three or more failed trials of antipsychotic monotherapy by history:  No  Follow-up recommendations:  Other:  Pt. will follow up with her psychiatrist as planned and her PCP as recomnmended for any health problems.  The patient received suicide prevention pamphlet:  Yes Belongings returned:  Clothing and Valuables   Patient phone:  [phone] (home)  Patient address:   [address] [place] [address],

## 2011-07-10 NOTE — Progress Notes (Signed)
Patient Discharge Instructions:  Admission Note Faxed,  07/10/2011 After Visit Summary Faxed,  07/10/2011 Faxed to the Next Level Care provider:  07/10/2011 D/C Summary Note faxed 07/10/2011 Facesheet faxed 07/10/2011  Faxed to [facility] @ [phone]    Soren Lazarz, Eduard Clos, 07/10/2011, 3:33 PM

## 2011-09-11 ENCOUNTER — Encounter (HOSPITAL_COMMUNITY): Payer: Self-pay

## 2011-09-11 ENCOUNTER — Emergency Department (HOSPITAL_COMMUNITY)
Admission: EM | Admit: 2011-09-11 | Discharge: 2011-09-12 | Disposition: A | Discharge status: Other Acute Inpatient Hospital | Payer: Self-pay | Attending: Emergency Medicine | Admitting: Emergency Medicine

## 2011-09-11 DIAGNOSIS — Z9889 Other specified postprocedural states: Secondary | ICD-10-CM | POA: Insufficient documentation

## 2011-09-11 DIAGNOSIS — R110 Nausea: Secondary | ICD-10-CM | POA: Insufficient documentation

## 2011-09-11 DIAGNOSIS — R45851 Suicidal ideations: Secondary | ICD-10-CM | POA: Insufficient documentation

## 2011-09-11 DIAGNOSIS — F319 Bipolar disorder, unspecified: Secondary | ICD-10-CM | POA: Insufficient documentation

## 2011-09-11 DIAGNOSIS — F17200 Nicotine dependence, unspecified, uncomplicated: Secondary | ICD-10-CM | POA: Insufficient documentation

## 2011-09-11 DIAGNOSIS — IMO0001 Reserved for inherently not codable concepts without codable children: Secondary | ICD-10-CM | POA: Insufficient documentation

## 2011-09-11 DIAGNOSIS — J45909 Unspecified asthma, uncomplicated: Secondary | ICD-10-CM | POA: Insufficient documentation

## 2011-09-11 DIAGNOSIS — Z87442 Personal history of urinary calculi: Secondary | ICD-10-CM | POA: Insufficient documentation

## 2011-09-11 DIAGNOSIS — G43909 Migraine, unspecified, not intractable, without status migrainosus: Secondary | ICD-10-CM | POA: Insufficient documentation

## 2011-09-11 DIAGNOSIS — Z79899 Other long term (current) drug therapy: Secondary | ICD-10-CM | POA: Insufficient documentation

## 2011-09-11 LAB — ETHANOL: Alcohol, Ethyl (B): <11 mg/dL (ref 0–11)

## 2011-09-11 LAB — URINALYSIS, ROUTINE W REFLEX MICROSCOPIC
Bilirubin Urine: NEGATIVE
Glucose, UA: NEGATIVE mg/dL
Ketones, ur: NEGATIVE mg/dL
Leukocytes, UA: NEGATIVE
Nitrite: NEGATIVE
Specific Gravity, Urine: 1.018 (ref 1.005–1.030)
pH: 6.5 (ref 5.0–8.0)

## 2011-09-11 LAB — CBC
Hemoglobin: 13.2 g/dL (ref 12.0–15.0)
MCH: 29.9 pg (ref 26.0–34.0)
MCHC: 33.9 g/dL (ref 30.0–36.0)
Platelets: 246 10*3/uL (ref 150–400)
RDW: 13 % (ref 11.5–15.5)

## 2011-09-11 LAB — RAPID URINE DRUG SCREEN, HOSP PERFORMED
Barbiturates: NOT DETECTED
Benzodiazepines: POSITIVE — AB
Cocaine: NOT DETECTED
Opiates: NOT DETECTED

## 2011-09-11 LAB — DIFFERENTIAL
Basophils Absolute: 0.1 10*3/uL (ref 0.0–0.1)
Basophils Relative: 1 % (ref 0–1)
Eosinophils Absolute: 0.3 10*3/uL (ref 0.0–0.7)
Neutro Abs: 3.9 10*3/uL (ref 1.7–7.7)
Neutrophils Relative %: 45 % (ref 43–77)

## 2011-09-11 LAB — BASIC METABOLIC PANEL
BUN: 13 mg/dL (ref 6–23)
Chloride: 107 mEq/L (ref 96–112)
GFR calc Af Amer: >90 mL/min (ref >90)
GFR calc non Af Amer: >90 mL/min (ref >90)
Potassium: 4 mEq/L (ref 3.5–5.1)
Sodium: 142 mEq/L (ref 135–145)

## 2011-09-11 MED ORDER — ALUM & MAG HYDROXIDE-SIMETH 200-200-20 MG/5ML PO SUSP: 30.0000 mL | ORAL | Status: DC | PRN

## 2011-09-11 MED ORDER — ZOLPIDEM TARTRATE 5 MG PO TABS
5.0000 mg | ORAL_TABLET | Freq: Every evening | ORAL | Status: DC | PRN
  Administered 2011-09-12: 5 mg via ORAL
  Filled 2011-09-11: qty 1

## 2011-09-11 MED ORDER — TOPIRAMATE 100 MG PO TABS
100.0000 mg | ORAL_TABLET | Freq: Every day | ORAL | Status: DC
  Administered 2011-09-12: 100 mg via ORAL
  Filled 2011-09-11: qty 1

## 2011-09-11 MED ORDER — KETOROLAC TROMETHAMINE 30 MG/ML IJ SOLN
30.0000 mg | Freq: Once | INTRAMUSCULAR | Status: AC
  Administered 2011-09-11: 30 mg via INTRAVENOUS
  Filled 2011-09-11: qty 1

## 2011-09-11 MED ORDER — DULOXETINE HCL 60 MG PO CPEP
60.0000 mg | ORAL_CAPSULE | Freq: Every day | ORAL | Status: DC
  Administered 2011-09-12: 60 mg via ORAL
  Filled 2011-09-11: qty 1

## 2011-09-11 MED ORDER — DIVALPROEX SODIUM ER 500 MG PO TB24
1000.0000 mg | ORAL_TABLET | Freq: Every day | ORAL | Status: DC
  Filled 2011-09-11: qty 2

## 2011-09-11 MED ORDER — ACETAMINOPHEN 325 MG PO TABS
650.0000 mg | ORAL_TABLET | ORAL | Status: DC | PRN
  Administered 2011-09-12: 650 mg via ORAL
  Filled 2011-09-11: qty 2

## 2011-09-11 MED ORDER — DULOXETINE HCL 30 MG PO CPEP: 30.0000 mg | ORAL_CAPSULE | ORAL | Status: DC

## 2011-09-11 MED ORDER — LORAZEPAM 1 MG PO TABS
1.0000 mg | ORAL_TABLET | Freq: Three times a day (TID) | ORAL | Status: DC | PRN
  Administered 2011-09-12 (×2): 1 mg via ORAL
  Filled 2011-09-11 (×2): qty 1

## 2011-09-11 MED ORDER — SODIUM CHLORIDE 0.9 % IV BOLUS (SEPSIS)
1000.0000 mL | Freq: Once | INTRAVENOUS | Status: AC
  Administered 2011-09-11: 1000 mL via INTRAVENOUS

## 2011-09-11 MED ORDER — METOCLOPRAMIDE HCL 5 MG/ML IJ SOLN
10.0000 mg | Freq: Once | INTRAMUSCULAR | Status: AC
  Administered 2011-09-11: 10 mg via INTRAVENOUS
  Filled 2011-09-11: qty 2

## 2011-09-11 MED ORDER — DEXAMETHASONE SODIUM PHOSPHATE 10 MG/ML IJ SOLN
10.0000 mg | Freq: Once | INTRAMUSCULAR | Status: AC
  Administered 2011-09-11: 10 mg via INTRAVENOUS
  Filled 2011-09-11: qty 1

## 2011-09-11 MED ORDER — IBUPROFEN 200 MG PO TABS
600.0000 mg | ORAL_TABLET | Freq: Three times a day (TID) | ORAL | Status: DC | PRN
  Administered 2011-09-12: 600 mg via ORAL
  Filled 2011-09-11: qty 3

## 2011-09-11 MED ORDER — DULOXETINE HCL 30 MG PO CPEP
30.0000 mg | ORAL_CAPSULE | ORAL | Status: DC
  Administered 2011-09-12: 30 mg via ORAL
  Filled 2011-09-11: qty 1

## 2011-09-11 MED ORDER — DIVALPROEX SODIUM ER 500 MG PO TB24
500.0000 mg | ORAL_TABLET | Freq: Every day | ORAL | Status: DC
  Administered 2011-09-12: 500 mg via ORAL
  Filled 2011-09-11: qty 1

## 2011-09-11 MED ORDER — DIPHENHYDRAMINE HCL 50 MG/ML IJ SOLN
25.0000 mg | Freq: Once | INTRAMUSCULAR | Status: AC
  Administered 2011-09-11: 25 mg via INTRAVENOUS
  Filled 2011-09-11: qty 1

## 2011-09-11 MED ORDER — DIVALPROEX SODIUM ER 500 MG PO TB24: 500.0000 mg | ORAL_TABLET | ORAL | Status: DC

## 2011-09-11 MED ORDER — ONDANSETRON HCL 8 MG PO TABS: 4.0000 mg | ORAL_TABLET | Freq: Three times a day (TID) | ORAL | Status: DC | PRN
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
## 2011-09-11 NOTE — ED Notes (Signed)
Faxed and called to initiate Tele Psych consult
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
## 2011-09-11 NOTE — ED Notes (Signed)
House coverage notified of need for sitter. Pt changed into scrubs and will be wanded prior to moving to room in the back.

## 2011-09-11 NOTE — ED Notes (Signed)
Pt states that she went to [facility] today for her regular appointment. She has been out of her psych meds for the past 4 days and states that she has been having suicidal thoughts. Pt denies any plan. She states that she has just been feeling suicidal and has also been having headaches, nightmares, panic attacks, and generalized malaise.

## 2011-09-12 ENCOUNTER — Encounter (HOSPITAL_COMMUNITY): Payer: Self-pay | Admitting: *Deleted

## 2011-09-12 ENCOUNTER — Inpatient Hospital Stay (HOSPITAL_COMMUNITY)
Admission: AD | Admit: 2011-09-12 | Discharge: 2011-09-16 | DRG: 885 | Disposition: A | Discharge status: Home / Self Care | Payer: 59 | Source: Ambulatory Visit | Attending: Psychiatry | Admitting: Psychiatry

## 2011-09-12 DIAGNOSIS — IMO0001 Reserved for inherently not codable concepts without codable children: Secondary | ICD-10-CM

## 2011-09-12 DIAGNOSIS — Z79899 Other long term (current) drug therapy: Secondary | ICD-10-CM

## 2011-09-12 DIAGNOSIS — Z87442 Personal history of urinary calculi: Secondary | ICD-10-CM

## 2011-09-12 DIAGNOSIS — F3130 Bipolar disorder, current episode depressed, mild or moderate severity, unspecified: Principal | ICD-10-CM

## 2011-09-12 DIAGNOSIS — F314 Bipolar disorder, current episode depressed, severe, without psychotic features: Secondary | ICD-10-CM | POA: Diagnosis present

## 2011-09-12 DIAGNOSIS — M129 Arthropathy, unspecified: Secondary | ICD-10-CM

## 2011-09-12 DIAGNOSIS — G43909 Migraine, unspecified, not intractable, without status migrainosus: Secondary | ICD-10-CM

## 2011-09-12 DIAGNOSIS — Z880 Allergy status to penicillin: Secondary | ICD-10-CM

## 2011-09-12 DIAGNOSIS — H8109 Meniere's disease, unspecified ear: Secondary | ICD-10-CM

## 2011-09-12 DIAGNOSIS — I499 Cardiac arrhythmia, unspecified: Secondary | ICD-10-CM

## 2011-09-12 DIAGNOSIS — R45851 Suicidal ideations: Secondary | ICD-10-CM

## 2011-09-12 DIAGNOSIS — J45909 Unspecified asthma, uncomplicated: Secondary | ICD-10-CM

## 2011-09-12 DIAGNOSIS — F411 Generalized anxiety disorder: Secondary | ICD-10-CM

## 2011-09-12 MED ORDER — MAGNESIUM HYDROXIDE 400 MG/5ML PO SUSP: 30.0000 mL | Freq: Every day | ORAL | Status: DC | PRN

## 2011-09-12 MED ORDER — ACETAMINOPHEN 325 MG PO TABS
650.0000 mg | ORAL_TABLET | Freq: Four times a day (QID) | ORAL | Status: DC | PRN
Start: 2011-09-12 — End: 2011-09-16
  Administered 2011-09-15: 650 mg via ORAL

## 2011-09-12 MED ORDER — MECLIZINE HCL 25 MG PO TABS
25.0000 mg | ORAL_TABLET | Freq: Once | ORAL | Status: AC
Start: 2011-09-12 — End: 2011-09-12
  Administered 2011-09-12: 25 mg via ORAL

## 2011-09-12 MED ORDER — DULOXETINE HCL 60 MG PO CPEP
60.0000 mg | ORAL_CAPSULE | Freq: Every day | ORAL | Status: DC
  Administered 2011-09-13 – 2011-09-16 (×4): 60 mg via ORAL
  Filled 2011-09-12: qty 7
  Filled 2011-09-12 (×5): qty 1

## 2011-09-12 MED ORDER — MECLIZINE HCL 25 MG PO TABS
ORAL_TABLET | ORAL | Status: AC
  Filled 2011-09-12: qty 1

## 2011-09-12 MED ORDER — DULOXETINE HCL 30 MG PO CPEP
30.0000 mg | ORAL_CAPSULE | ORAL | Status: DC
  Administered 2011-09-13 – 2011-09-16 (×4): 30 mg via ORAL
  Filled 2011-09-12: qty 7
  Filled 2011-09-12 (×5): qty 1

## 2011-09-12 MED ORDER — PROMETHAZINE HCL 25 MG/ML IJ SOLN
25.0000 mg | Freq: Once | INTRAMUSCULAR | Status: AC
  Administered 2011-09-12: 25 mg via INTRAMUSCULAR
  Filled 2011-09-12: qty 1

## 2011-09-12 MED ORDER — DIVALPROEX SODIUM 500 MG PO DR TAB
500.0000 mg | DELAYED_RELEASE_TABLET | Freq: Every day | ORAL | Status: DC
  Filled 2011-09-12: qty 1

## 2011-09-12 MED ORDER — TOPIRAMATE 100 MG PO TABS
100.0000 mg | ORAL_TABLET | Freq: Every day | ORAL | Status: DC
  Administered 2011-09-13 – 2011-09-16 (×4): 100 mg via ORAL
  Filled 2011-09-12 (×2): qty 1
  Filled 2011-09-12: qty 7
  Filled 2011-09-12 (×3): qty 1

## 2011-09-12 MED ORDER — DIVALPROEX SODIUM ER 500 MG PO TB24: 1000.0000 mg | ORAL_TABLET | Freq: Every day | ORAL | Status: DC

## 2011-09-12 MED ORDER — DIVALPROEX SODIUM 500 MG PO DR TAB
1000.0000 mg | DELAYED_RELEASE_TABLET | Freq: Every day | ORAL | Status: DC
  Filled 2011-09-12: qty 2

## 2011-09-12 MED ORDER — DIVALPROEX SODIUM ER 500 MG PO TB24
500.0000 mg | ORAL_TABLET | Freq: Every day | ORAL | Status: DC
  Administered 2011-09-13 – 2011-09-16 (×4): 500 mg via ORAL
  Filled 2011-09-12 (×2): qty 1
  Filled 2011-09-12: qty 21
  Filled 2011-09-12 (×3): qty 1

## 2011-09-12 MED ORDER — DIVALPROEX SODIUM ER 500 MG PO TB24
1000.0000 mg | ORAL_TABLET | Freq: Every day | ORAL | Status: DC
  Administered 2011-09-12 – 2011-09-15 (×4): 1000 mg via ORAL
  Filled 2011-09-12 (×5): qty 2
  Filled 2011-09-12: qty 14

## 2011-09-12 MED ORDER — HYDROXYZINE HCL 50 MG PO TABS
50.0000 mg | ORAL_TABLET | Freq: Once | ORAL | Status: AC
  Administered 2011-09-12: 50 mg via ORAL
  Filled 2011-09-12: qty 1

## 2011-09-12 MED ORDER — ALUM & MAG HYDROXIDE-SIMETH 200-200-20 MG/5ML PO SUSP
30.0000 mL | ORAL | Status: DC | PRN
  Administered 2011-09-12 – 2011-09-15 (×2): 30 mL via ORAL

## 2011-09-12 NOTE — ED Notes (Signed)
Dinner tray ordered, regular nonsharp

## 2011-09-12 NOTE — ED Notes (Signed)
Patient  Having tele psych  Conference.

## 2011-09-12 NOTE — ED Notes (Signed)
Dinner tray delivered.

## 2011-09-12 NOTE — ED Notes (Signed)
Reg/no sharps b'fast requested    

## 2011-09-12 NOTE — ED Notes (Signed)
Lunch tray ordered 
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
## 2011-09-12 NOTE — ED Notes (Signed)
Moved patient from room 25 to 27    For a quieter room.

## 2011-09-13 DIAGNOSIS — F411 Generalized anxiety disorder: Secondary | ICD-10-CM

## 2011-09-13 LAB — CBC
HCT: 38.8 % (ref 36.0–46.0)
Hemoglobin: 13.1 g/dL (ref 12.0–15.0)
MCH: 29.6 pg (ref 26.0–34.0)
MCHC: 33.8 g/dL (ref 30.0–36.0)
MCV: 87.8 fL (ref 78.0–100.0)
RBC: 4.42 MIL/uL (ref 3.87–5.11)

## 2011-09-13 LAB — VALPROIC ACID LEVEL: Valproic Acid Lvl: 68.1 ug/mL (ref 50.0–100.0)

## 2011-09-13 LAB — TSH: TSH: 2.288 u[IU]/mL (ref 0.350–4.500)

## 2011-09-13 LAB — COMPREHENSIVE METABOLIC PANEL
ALT: 17 U/L (ref 0–35)
BUN: 11 mg/dL (ref 6–23)
CO2: 20 mEq/L (ref 19–32)
Calcium: 8.9 mg/dL (ref 8.4–10.5)
Creatinine, Ser: 0.55 mg/dL (ref 0.50–1.10)
GFR calc Af Amer: >90 mL/min (ref >90)
GFR calc non Af Amer: >90 mL/min (ref >90)
Glucose, Bld: 102 mg/dL — ABNORMAL HIGH (ref 70–99)
Total Protein: 6.8 g/dL (ref 6.0–8.3)

## 2011-09-13 MED ORDER — KETOROLAC TROMETHAMINE 30 MG/ML IJ SOLN
30.0000 mg | Freq: Once | INTRAMUSCULAR | Status: AC
  Administered 2011-09-13: 30 mg via INTRAMUSCULAR
  Filled 2011-09-13: qty 1

## 2011-09-13 NOTE — Progress Notes (Signed)
Adult Psychosocial Assessment Update Interdisciplinary Team  Previous Behavior Health Hospital admissions/discharges:  Admissions Discharges  Date: 06/26/2011 Date:  07/09/2011  Date: Date:  Date: Date:  Date: Date:  Date: Date:   Changes since the last Psychosocial Assessment (including adherence to outpatient mental health and/or substance abuse treatment, situational issues contributing to decompensation and/or relapse). Pt. Wasn't talking medication on a regular basis.  Pt. Stated that she "got really depressed".    Pt. Indicated that she had suicidal thoughts but did not act on them.            Discharge Plan 1. Will you be returning to the same living situation after discharge?   Yes:  Pt. Will be returning No:      If no, what is your plan?           2. Would you like a referral for services when you are discharged? Yes:     If yes, for what services?  No:       Pt. Has a psychrist.  Pt. Has an appointment set up in May.  However, she can not  remember the date.     Summary and Recommendations (to be completed by the evaluator) Pt. Is a 40 yr. Old female.  Recommendations for treatment include crisis stabilization,   Case management, medication mgmt., psycoeducation to teach coping skills and group  Therapy.                   Signature:  Rhunette Croft, 09/13/2011 3:52 PM
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
## 2011-09-13 NOTE — Progress Notes (Signed)
Patient ID: [name], female   DOB: 09/15/1970, 41 y.o.   MRN: [id number]  [name] Group Notes:  (Counselor/Nursing/MHT/Case Management/Adjunct)  09/13/2011 1:15 PM  Type of Therapy:  Group Therapy, Dance/Movement Therapy   Participation Level:  Did Not Attend  Allison Bolton

## 2011-09-13 NOTE — Progress Notes (Addendum)
[facility] Adult Inpatient Family/Significant Other Suicide Prevention Education  Suicide Prevention Education:  Education Completed; Ricky Stabs [phone] (friend),  (name of family member/significant other) has been identified by the patient as the family member/significant other with whom the patient will be residing, and identified as the person(s) who will aid the patient in the event of a mental health crisis (suicidal ideations/suicide attempt).  With written consent from the patient, the family member/significant other has been provided the following suicide prevention education, prior to the and/or following the discharge of the patient.  The suicide prevention education provided includes the following:  Suicide risk factors  Suicide prevention and interventions  National Suicide Hotline telephone number  [facility] assessment telephone number  [facility] Emergency Assistance 911  [facility] and/or Residential Mobile Crisis Unit telephone number  Request made of family/significant other to:  Remove weapons (e.g., guns, rifles, knives), all items previously/currently identified as safety concern.    Remove drugs/medications (over-the-counter, prescriptions, illicit drugs), all items previously/currently identified as a safety concern.  The family member/significant other verbalizes understanding of the suicide prevention education information provided.  The family member/significant other agrees to remove the items of safety concern listed above.  Peggy stated that the reason pt lost her medicaid and went off her medications for she could not afford them. Pt is not working and has no insurance. Gigi Gin is worried that this will occur again if the pt does not get help getting her medications. Gigi Gin has no safety concerns.  Pt. accepted information on suicide prevention, warning signs to look for with suicide and crisis line numbers to use. The pt. agreed to call  crisis line numbers if having warning signs or having thoughts of suicide.    [facility] 09/13/2011, 4:21 PM

## 2011-09-13 NOTE — Tx Team (Signed)
Initial Interdisciplinary Treatment Plan  PATIENT STRENGTHS: (choose at least two) Average or above average intelligence Communication skills General fund of knowledge Supportive family/friends  PATIENT STRESSORS: Financial difficulties Health problems Legal issue Loss of daughter to foster care.*   PROBLEM LIST: Problem List/Patient Goals Date to be addressed Date deferred Reason deferred Estimated date of resolution  Pt. Wants to get back on her medication 09/12/11     Depressed with suicidal ideation 09/12/11     Difficulty sleeping 09/811                                          DISCHARGE CRITERIA:  Ability to meet basic life and health needs Improved stabilization in mood, thinking, and/or behavior Need for constant or close observation no longer present Verbal commitment to aftercare and medication compliance  PRELIMINARY DISCHARGE PLAN: Outpatient therapy Return to previous living arrangement  PATIENT/FAMIILY INVOLVEMENT: This treatment plan has been presented to and reviewed with the patient, JANIS SOL. The patient and family have been given the opportunity to ask questions and make suggestions.  Talmadge Chad 09/13/2011, 4:45 AM

## 2011-09-13 NOTE — Progress Notes (Signed)
Patient ID: Allison Bolton, female   DOB: January 13, 1971, 41 y.o.   MRN: [id number] The patient is pleasant and interacting in the milieu. She is very worried about her legal charges and concerned that her ankle bracelet battery needs to be charged. Helped to make a phone call to a friend who is going to bring her charger to her tomorrow. Provided the number of [facility]. She made a phone call to them to let them know she was in the hospital. Signed a 72 hour release petition, stating she needs to get home to take care of her legal problems. Denies having suicidal ideation. Denies any A/V hallucination.
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
## 2011-09-14 NOTE — Progress Notes (Signed)
Patient ID: Allison Bolton, female   DOB: [birth year], 41 y.o.   MRN: [id number] Allison Bolton is fully alert up and ambulatory today and in no distress. Her headache has been relieved. She denies suicidal thoughts, scores depression is 0 and anxiety is 0.  I modestly not quite sure why this patient is here and I suggested to her that she should be ready for discharge tomorrow and she is in agreement.

## 2011-09-14 NOTE — H&P (Signed)
Patient chart reviewed. Agree with the treatment plan.

## 2011-09-14 NOTE — Progress Notes (Signed)
[facility] Group Notes:  (Counselor/Nursing/MHT/Case Management/Adjunct)  09/14/2011 4:58 PM  Type of Therapy:  Group Therapy  Participation Level:  Active  Participation Quality:  Appropriate, Attentive and Sharing  Affect:  Appropriate  Cognitive:  Appropriate  Insight:  Good  Engagement in Group:  Good  Engagement in Therapy:  Good  Modes of Intervention:  Clarification, Problem-solving, Socialization and Support  Summary of Progress/Problems:Pt. participated in group discussion on supports, who they have  as support and what support means to them.. The pt. shared how they can support themselves and participated in activity in group as to what it feels like to actually feel support. Pt. stated she was doing good and slept well. The pt. stated she had her friends in the town of [address]" that are her supports.  Lamar Blinks [place] 09/14/2011, 4:58 PM

## 2011-09-15 MED ORDER — PRAZOSIN HCL 1 MG PO CAPS
1.0000 mg | ORAL_CAPSULE | Freq: Every evening | ORAL | Status: DC | PRN
  Administered 2011-09-15: 1 mg via ORAL
  Filled 2011-09-15: qty 1
  Filled 2011-09-15 (×2): qty 14
  Filled 2011-09-15 (×4): qty 1

## 2011-09-15 NOTE — Progress Notes (Signed)
Patient ID: Allison Bolton, female   DOB: 08-Jan-1971, 41 y.o.   MRN: [id number] Pt did not sleep well last night.  She says that she has nightmares very often.  Will prescribe Minipress for this.  She reports that even on her stable dose of Depakote 500 in AM and 1000 at HS, she still has racing thoughts.  This could suggest that she needs more of that or other medications for her mood control.  She describes migraines that have improved lots with the starting of the Topamax.  Will plan on D/C in AM.

## 2011-09-15 NOTE — Progress Notes (Signed)
Patient ID: Allison Bolton, female   DOB: February 11, 1971, 41 y.o.   MRN: [id number] Patient's self inventory, has poor sleep, poor appetite, low energy level, poor attention span.   Rated depression #1, hopelessness #2.  Denied SI.  Pain goal #1, worst pain #1.  Plans to take meds properly after discharge.  No discharge plans at this time.  No problems taking meds after discharge.

## 2011-09-15 NOTE — Progress Notes (Signed)
[facility] Group Notes:  (Counselor/Nursing/MHT/Case Management/Adjunct)  09/15/2011 12:01 PM  Type of Therapy:  Group Therapy  Participation Level:  Minimal  Participation Quality:  Appropriate  Affect:  Appropriate  Cognitive:  Appropriate  Insight:  Good  Engagement in Group:  Good  Engagement in Therapy:  Good  Modes of Intervention:  Support  Summary of Progress/Problems: Patient appeared engaged in group and seemed interested in contributions from other group members, although her own participation was minimal.   Mendleson, Melora Menon 09/15/2011, 12:01 PM

## 2011-09-15 NOTE — Progress Notes (Signed)
Patient ID: Allison Bolton, female   DOB: [birth year], 41 y.o.   MRN: [id number] The patient is interacting in the milieu. She attended and actively participated in group. Denied any suicidal/homicidal ideation. Is getting anxious about being in the hospital and wants to be discharged. Signed a 72 hour request for discharge yesterday.

## 2011-09-15 NOTE — Progress Notes (Signed)
[facility] Group Notes:  (Counselor/Nursing/MHT/Case Management/Adjunct)    Type of Therapy:  Group Therapy  Participation Level:  Did Not Attend       Allison Bolton 09/15/2011  2:52 PM

## 2011-09-15 NOTE — Progress Notes (Signed)
Patient seen to assess for discharge planning needs.  She denies SI/HI.  She rates depression, hopelessness/helplessness at one and anxiety at two.  She shared that she had not been taking her meds because she could not afford them.  She plans to return to her friend's home at discharge and follow up with [facility] in [place].  Writer informed MD that patient has lack of criteria noted in this chart at time of note to request additional inpatient days.

## 2011-09-15 NOTE — Progress Notes (Signed)
Patient came to med window for her HS medications. She received medications as ordered. Denied SI/HI and denied hallucinations. Mood and affects flat and depressed. She reported having a good day; interacting well with peers. Writer informed patient about the repeat dose of her sleep med. Patient encouraged and supported. Q 15 minute check continues as ordered to maintain safety.
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
## 2011-09-15 NOTE — Tx Team (Signed)
Interdisciplinary Treatment Plan Update (Adult)  Date:  09/15/2011  Time Reviewed:  10:29 AM   Progress in Treatment: Attending groups:   Yes   Participating in groups:  Yes Taking medication as prescribed:  Yes Tolerating medication:  Yes Family/Significant othe contact made:  Patient understands diagnosis:  Yes Discussing patient identified problems/goals with staff: Yes Medical problems stabilized or resolved: Yes Denies suicidal/homicidal ideation:Yes Issues/concerns per patient self-inventory:  Other:  New problem(s) identified:  Reason for Continuation of Hospitalization: Depression Medication Management  Interventions implemented related to continuation of hospitalization:  Medication Management; safety checks q 15 mins  Additional comments:  Estimated length of stay: 1 day  Discharge Plan: Home with friend - follow up with [facility]  New goal(s):  Review of initial/current patient goals per problem list:   1.  Goal(s):  Eliminate SI  Met:  Yes  Target date:d/c  As evidenced by: Patient is not endorsing SI  2.  Goal (s):  Reduce depression  Met:  No  Target date: d/c  As evidenced by:  Allison Bolton will rate depression at four or less  3.  Goal(s):  Stabilize on medications  Met:  No  Target date: d/c  As evidenced by:  Allison Bolton will report medications are working  4.  Goal(s):  Schedule outpatient follow up  Met:  No  Target date: d/c  As evidenced by:  Follow up to be scheduled with [facility] in [place]  Attendees: Patient:     Family:     Physician:  Orson Aloe, MD 09/15/2011 10:29 AM   Nursing:    09/15/2011 10:29 AM   CaseManager:  Juline Patch, LCSW 09/15/2011 10:29 AM   Counselor:  Angus Palms, LCSW 09/15/2011 10:29 AM   Other:  Consuello Bossier, NP 09/15/2011 10:29 AM   Other:  Reyes Ivan, LCSWA 09/15/2011  10:29 AM   Other:     Other:      Scribe for Treatment Team:   Wynn Banker, LCSW,  09/15/2011 10:29 AM

## 2011-09-15 NOTE — Tx Team (Signed)
Interdisciplinary Treatment Plan Update (Adult)  Date:  09/15/2011  Time Reviewed:  10:25 AM   Progress in Treatment: Attending groups: Yes, attended weekend groups but did not attend aftercare group this morning Participating in groups:  Yes Taking medication as prescribed:  Yes Tolerating medication: Yes Family/Significant othe contact made:  Yes, contact made with: Ricky Stabs, friend Patient understands diagnosis: Yes Discussing patient identified problems/goals with staff:  Yes Medical problems stabilized or resolved: Yes Denies suicidal/homicidal ideation: Yes Issues/concerns per patient self-inventory:  No  Other:  New problem(s) identified: None  Reason for Continuation of Hospitalization:   Interventions implemented related to continuation of hospitalization:  Medication stabilization, safety checks q 15 mins, group attendance  Additional comments:  Estimated length of stay: 1 day  Discharge Plan: Discharge home and follow up with [facility]  New goal(s):  Review of initial/current patient goals per problem list:   1.  Goal(s): Decrease depressive sypmtoms  Met:  Yes  Target date: by discharge  As evidenced by: Jaretssi rates depression at 1 today (down from 8/9 at admission)  2.  Goal (s): Increase ability to sleep  Met:  No  Target date: by discharge  As evidenced by: Aadvika is taking sleep medication but is still sleeping poorly  3.  Goal(s): Medication stabilization  Met:  Yes  Target date: by discharge  As evidenced by: Roberta reports medications are working and no intolerable side effects  4.  Goal(s): Decrease potential for self-harm  Met:  Yes  Target date: by discharge  As evidenced by: Ilyse denies any suicidal thoughts today.  Attendees: Patient:   09/15/2011 10:25 AM  Family:   09/15/2011 10:25 AM  Physician:  Dr Orson Aloe, MD 09/15/2011 10:25 AM  Nursing:   Quintella Reichert, RN 09/15/2011 10:25 AM  Case Manager:  Juline Patch, LCSW 09/15/2011 10:25 AM  Counselor:  Angus Palms, LCSW 09/15/2011 10:25 AM  Other:  Reyes Ivan, LCSWA 09/15/2011 10:25 AM  Other:  Carolynn Comment, RN 09/15/2011 10:25 AM  Other:  Charlyne Mom, doctoral psych intern  09/15/2011 10:25 AM  Other:   09/15/2011 10:25 AM   Scribe for Treatment Team:   [name], 09/15/2011 10:25 AM

## 2011-09-16 DIAGNOSIS — F3130 Bipolar disorder, current episode depressed, mild or moderate severity, unspecified: Principal | ICD-10-CM

## 2011-09-16 MED ORDER — TOPIRAMATE 100 MG PO TABS: 100.0000 mg | ORAL_TABLET | Freq: Every day | ORAL | Status: DC

## 2011-09-16 MED ORDER — DIVALPROEX SODIUM ER 500 MG PO TB24: 500.0000 mg | ORAL_TABLET | ORAL | Status: DC

## 2011-09-16 MED ORDER — PRAZOSIN HCL 1 MG PO CAPS: 1.0000 mg | ORAL_CAPSULE | Freq: Every evening | ORAL | Status: DC | PRN

## 2011-09-16 NOTE — Progress Notes (Addendum)
Discharge Note:   Patient's friend picked up patient and going to patient's home.   Patient denied SI and HI.   Denied A/V hallucinations.   Denied pain.   Suicide prevention information was given to patient, discussed with patient, who stated she understood and had no questions.   Patient received all her prescriptions, medications, clothing, miscellaneous items.  Patient stated she appreciates all the staff has done to assist her while at [name].

## 2011-09-16 NOTE — Progress Notes (Signed)
In bed with eyes closed.  Exhibiting normal sleep behavior.  Safety checks conducted Q 15 minutes.  Asleep and safe at present.

## 2011-09-16 NOTE — Progress Notes (Signed)
On self inventory sheet, patient needs sleep medication, has good appetite, normal energy level, good attention span.  Rated depression #1, denied hopelessness.  Denied SI.  After discharge patient will take her meds properly.  Does not have discharge plans.  No problems taking her meds after discharge. Patient has been cooperative, pleasant and alert this morning.

## 2011-09-16 NOTE — Tx Team (Signed)
Interdisciplinary Treatment Plan Update (Adult)  Date:  09/16/2011  Time Reviewed:  10:54 AM   Progress in Treatment: Attending groups: Yes Participating in groups:  Yes Taking medication as prescribed: Yes Tolerating medication:  Yes Family/Significant othe contact made:  Yes - contact made with pt's friend she lives with Patient understands diagnosis:  Yes Discussing patient identified problems/goals with staff:  Yes Medical problems stabilized or resolved:  Yes Denies suicidal/homicidal ideation: Yes Issues/concerns per patient self-inventory:  None identified Other: N/A  New problem(s) identified: None Identified  Reason for Continuation of Hospitalization: Stable to d/c  Interventions implemented related to continuation of hospitalization: Stable to d/c  Additional comments: N/A  Estimated length of stay: D/C today  Discharge Plan: Pt will follow up at [facility] for medication management and therapy.    New goal(s): N/A  Review of initial/current patient goals per problem list:  1. Goal(s): Eliminate SI  Met: Yes  Target date:d/c  As evidenced by: Patient is not endorsing SI 2. Goal (s): Reduce depression  Met: Yes Target date: d/c  As evidenced by: Jaidyn will rate depression at four or less - Pt denies depression today. 3. Goal(s): Stabilize on medications  Met: Yes Target date: d/c  As evidenced by: Tashay will report medications are working - Pt reports feeling stable today 4. Goal(s): Schedule outpatient follow up  Met: Yes Target date: d/c  As evidenced by: Follow up to be scheduled with [facility] in [place]     Attendees: Patient:  Allison Bolton 09/16/2011 10:57 AM   Family:     Physician:  Orson Aloe, MD  09/16/2011  10:54 AM   Nursing:   Quintella Reichert, RN 09/16/2011 10:57 AM   Case Manager:  Reyes Ivan, LCSWA 09/16/2011  10:54 AM   Counselor:  Angus Palms, LCSW 09/16/2011  10:54 AM                Scribe for Treatment  Team:   Carmina Miller, 09/16/2011 , 10:54 AM

## 2011-09-16 NOTE — Discharge Summary (Signed)
I agree with this D/C Summary.  

## 2011-09-16 NOTE — Progress Notes (Signed)
[facility] Group Notes:  (Counselor/Nursing/MHT/Case Management/Adjunct)    Type of Therapy:  Group Therapy  Participation Level:  Did Not Attend - Allison Bolton was meeting with treatment team for the majority of group and came in during the last few minutes to charge her ankle bracelet but was not engaged in that short time.   [name] 09/16/2011  1:56 PM
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
## 2011-09-16 NOTE — Progress Notes (Signed)
Grief and Loss Group   Facilitated grief and loss group on [address] hall. Discussed group rules/norms and reviewed privacy. Discussed types of loss and grief reactions. Group was large and mostly quiet, required facilitator to used silence often, but members did share as group progressed. Members were supportive of one another and provided universality/validation. Main topic of grief/loss in current group was loss of children and loss of childhood.   Pt was present in group and attentive to other members/discussion, but did not participate.  Allison Bolton B MS, LPCA, NCC

## 2011-09-16 NOTE — Progress Notes (Signed)
[facility] Case Management Discharge Plan:  Will you be returning to the same living situation after discharge: Yes,  return home with friend At discharge, do you have transportation home?:Yes,  pt's friend to pick pt up Do you have the ability to pay for your medications:Yes,  access to meds  Release of information consent forms completed and in the chart;  Patient's signature needed at discharge.  Patient to Follow up at:  Follow-up Information    Follow up with Arna Medici on 09/18/2011. (Appointment scheduled at 8:00 AM Referral # [id number])    Contact information:   [address] [place], [place] [address] [phone]         Patient denies SI/HI:   Yes,  denies SI/HI    Safety Planning and Suicide Prevention discussed:  Yes,  discussed with pt  Barrier to discharge identified:No.  Summary and Recommendations: Pt attended discharge planning group and actively participated.  Pt presents with calm mood and affect.  Pt denies having depression, anxiety and SI.  Pt reports feeling stable to discharge today.  No recommendations from SW.  No further needs voiced by pt.  Pt stable to discharge.     Carmina Miller 09/16/2011, 10:23 AM

## 2011-09-18 NOTE — Progress Notes (Signed)
Patient Discharge Instructions:  Psychiatric Admission Assessment Note Provided,  09/18/2011 Discharge Summary Note Provided,   09/18/2011 After Visit Summary (AVS) Provided,  09/18/2011 Face Sheet Provided, 09/18/2011 Faxed/Sent to the Next Level Care provider:  09/18/2011  Faxed to [facility] @ [phone]  Heloise Purpura, Eduard Clos, 09/18/2011, 6:06 PM

## 2011-10-27 IMAGING — CR DG LUMBAR SPINE COMPLETE 4+V
5 series · 5 of 5 positions shown · non-contrast
Comparison: 07/29/2010 CT

CLINICAL DATA: 40-year-old female with fall and low back pain.

LUMBAR SPINE - COMPLETE 4+ VIEW

[view not recorded (1 of 5)]
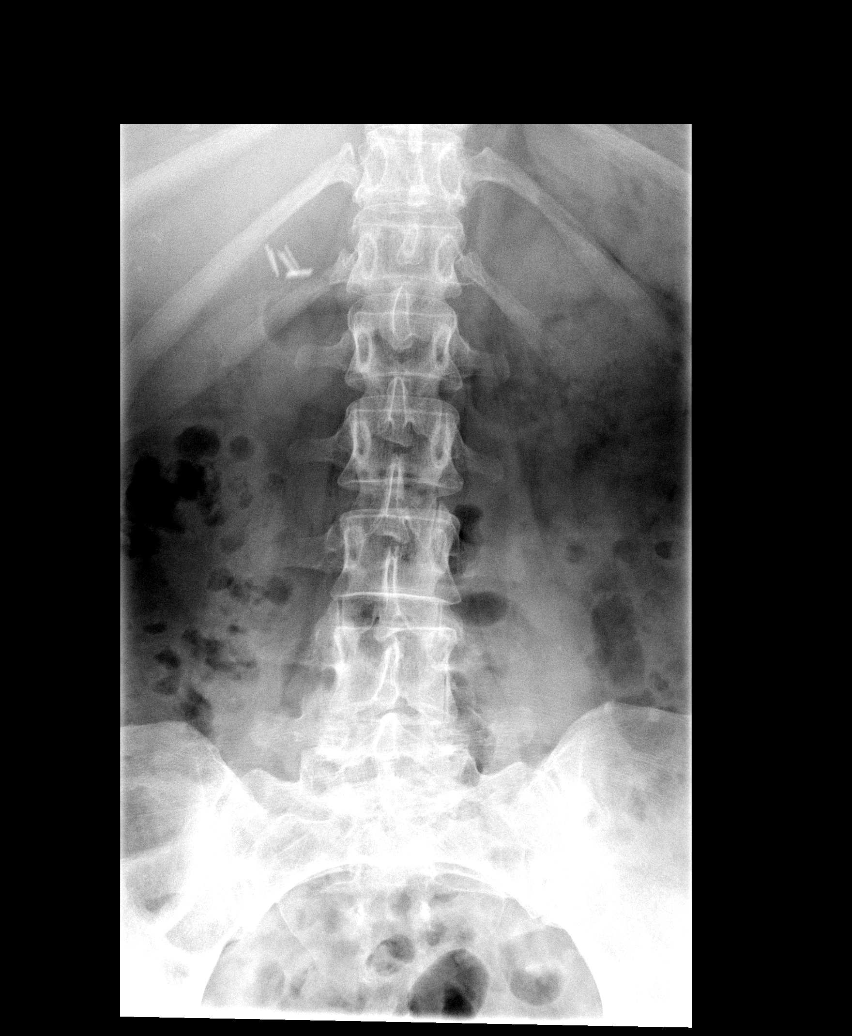

[view not recorded (2 of 5)]
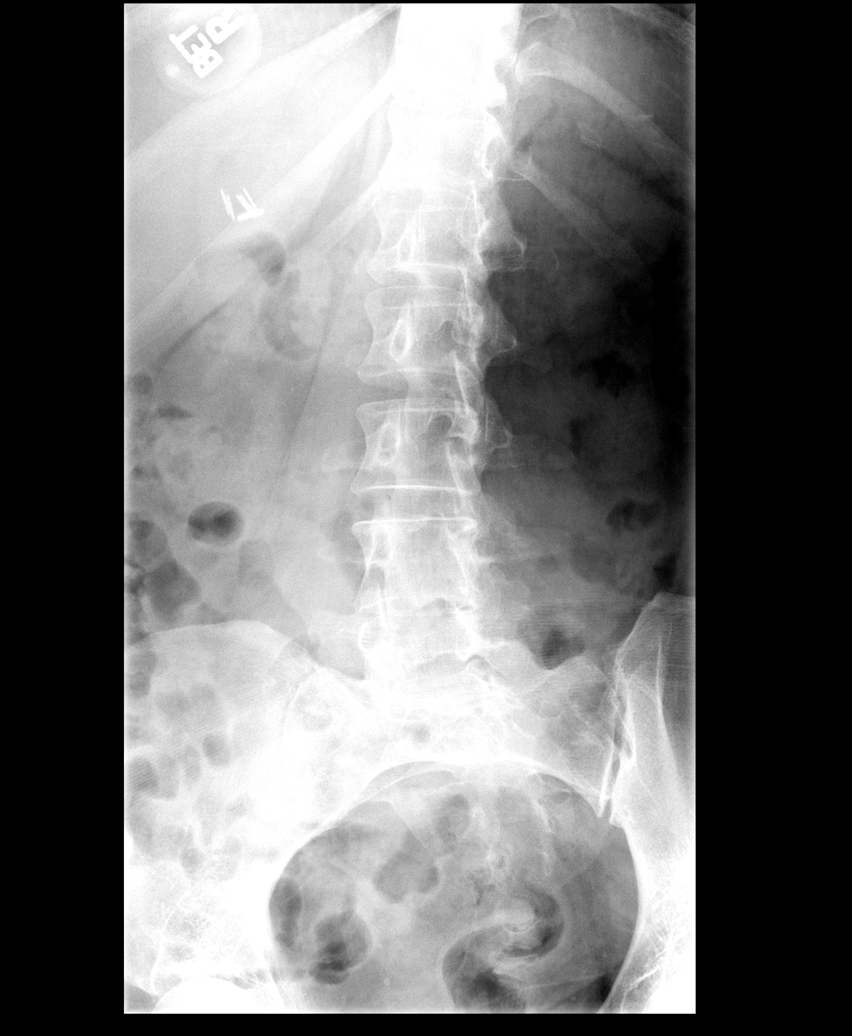

[view not recorded (3 of 5)]
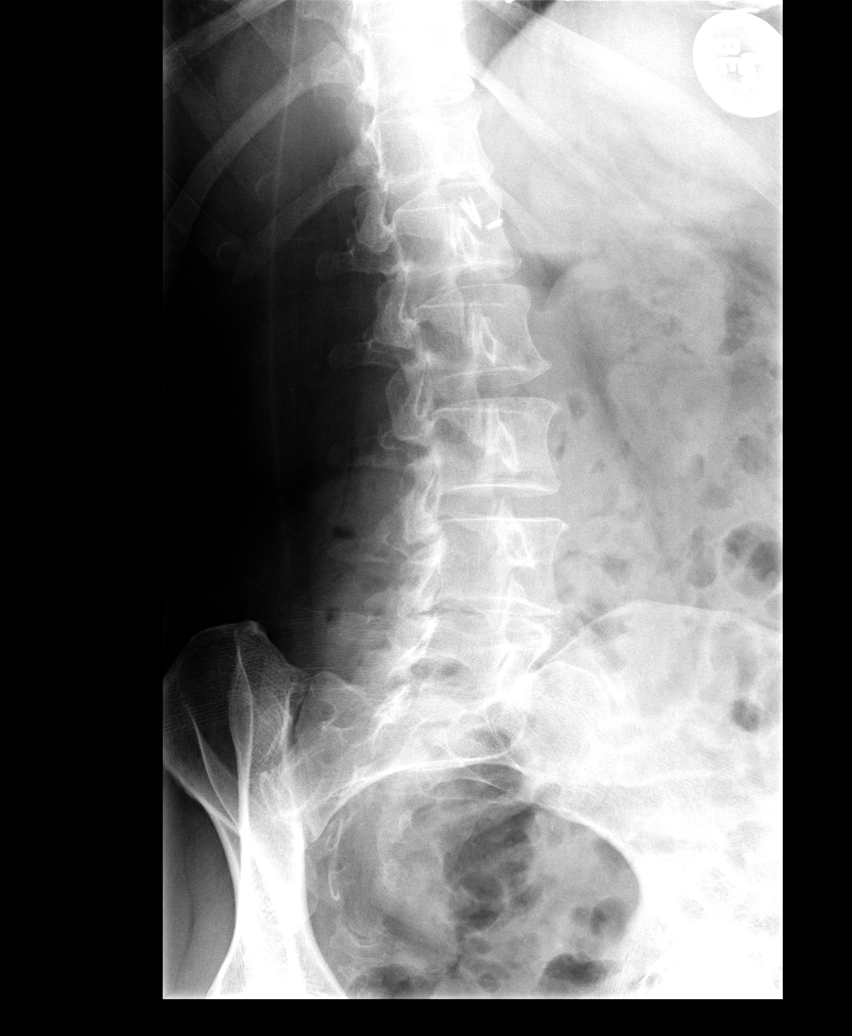

[view not recorded (4 of 5)]
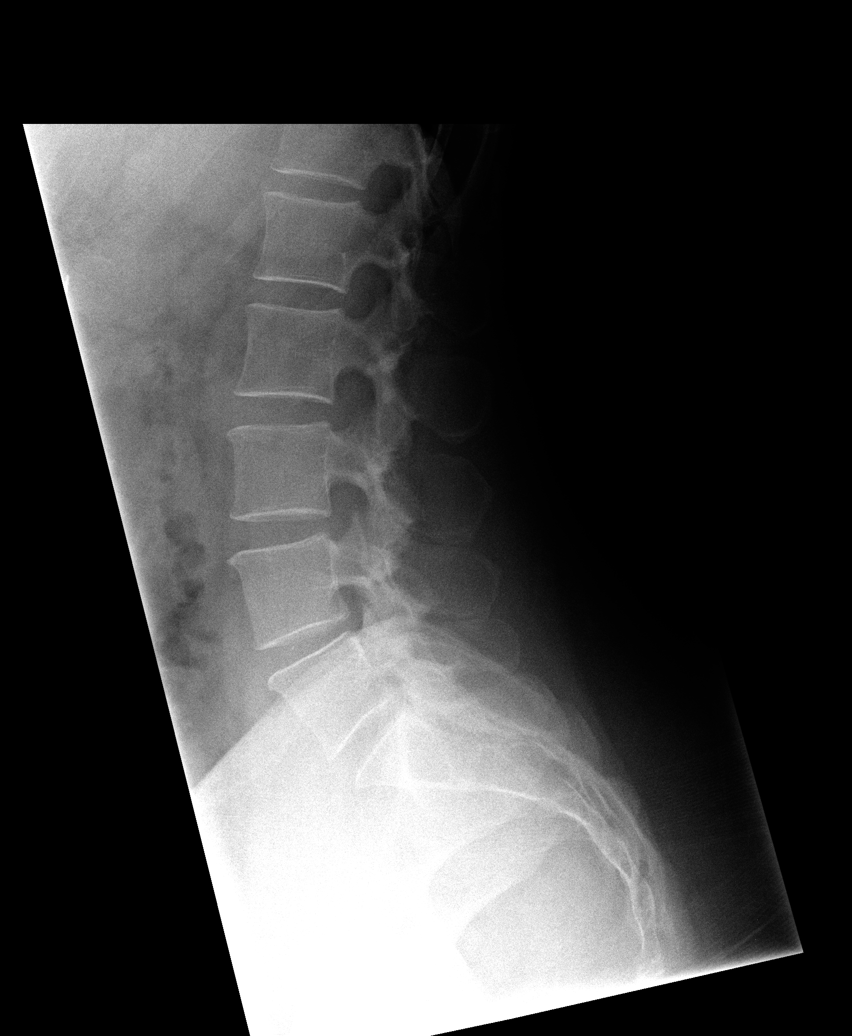

[view not recorded (5 of 5)]
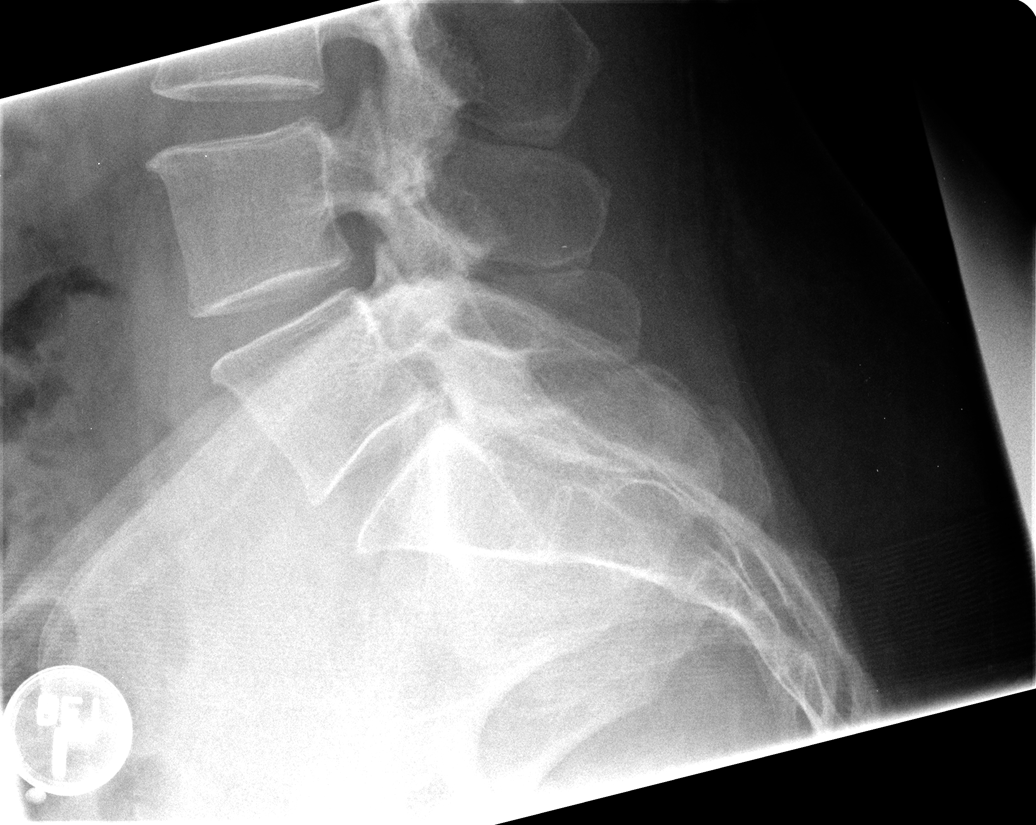

[5 of 5 positions shown; findings below may reference images not displayed]

FINDINGS: Five non-rib bearing lumbar type vertebra are identified
in normal alignment.
There is no evidence of fracture or subluxation.
The disc spaces are maintained.
No focal bony lesions or spondylolysis identified.
IMPRESSION: Unremarkable lumbar spine series.

## 2011-12-11 DIAGNOSIS — R079 Chest pain, unspecified: Secondary | ICD-10-CM

## 2012-04-06 ENCOUNTER — Ambulatory Visit: Payer: Self-pay | Admitting: Cardiology

## 2012-04-19 ENCOUNTER — Ambulatory Visit: Payer: Self-pay | Admitting: Cardiology

## 2012-04-30 ENCOUNTER — Encounter: Payer: Self-pay | Admitting: *Deleted

## 2012-04-30 ENCOUNTER — Ambulatory Visit (INDEPENDENT_AMBULATORY_CARE_PROVIDER_SITE_OTHER): Payer: Medicaid Other | Admitting: Cardiology

## 2012-04-30 ENCOUNTER — Encounter: Payer: Self-pay | Admitting: Cardiology

## 2012-04-30 ENCOUNTER — Other Ambulatory Visit: Payer: Self-pay | Admitting: Cardiology

## 2012-04-30 VITALS — BP 126/98 | HR 69 | Ht 66.0 in | Wt 216.8 lb

## 2012-04-30 DIAGNOSIS — R079 Chest pain, unspecified: Secondary | ICD-10-CM

## 2012-04-30 DIAGNOSIS — Z79899 Other long term (current) drug therapy: Secondary | ICD-10-CM

## 2012-04-30 DIAGNOSIS — R0600 Dyspnea, unspecified: Secondary | ICD-10-CM

## 2012-04-30 DIAGNOSIS — R0609 Other forms of dyspnea: Secondary | ICD-10-CM

## 2012-04-30 DIAGNOSIS — R0602 Shortness of breath: Secondary | ICD-10-CM

## 2012-04-30 DIAGNOSIS — F17200 Nicotine dependence, unspecified, uncomplicated: Secondary | ICD-10-CM

## 2012-04-30 NOTE — Patient Instructions (Signed)
   Treadmill Myoview  Echo  Labs:  FLP, TSH, BMET, BNP   Office will contact with results Continue all current medications. Follow up in  2 weeks

## 2012-05-01 DIAGNOSIS — F17200 Nicotine dependence, unspecified, uncomplicated: Secondary | ICD-10-CM | POA: Insufficient documentation

## 2012-05-01 DIAGNOSIS — R079 Chest pain, unspecified: Secondary | ICD-10-CM | POA: Insufficient documentation

## 2012-05-01 DIAGNOSIS — IMO0001 Reserved for inherently not codable concepts without codable children: Secondary | ICD-10-CM | POA: Insufficient documentation

## 2012-05-01 DIAGNOSIS — R0600 Dyspnea, unspecified: Secondary | ICD-10-CM | POA: Insufficient documentation

## 2012-05-01 NOTE — Progress Notes (Signed)
Patient ID: Allison Bolton, female   DOB: [birth year], 41 y.o.   MRN: [id number] PCP: Dr. Debbra Riding  41 yo smoker with history of bipolar disorder presents for cardiology evaluation.  For the last couple of months, she has been getting chest heaviness and dyspnea while walking.  This will occur after she walks for about 10 minutes and will resolve with rest.  She also gets chest heaviness and dyspnea walking up hills . She has woken up at night with chest heaviness.  Symptoms are not related to meals and do not feel like prior GERD.  She has also had lower extremity edema for the last couple of months.  She was started on Lasix 20 mg daily with resolution of edema.  The Lasix did not effect the exertional chest pain/dyspnea.  She continues to smoke.  Her brother had a stent at age 33.  She has had 2 visits to the ER in the last couple of months with chest pain.    ECG: NSR, normal  PMH: 1. Bipolar disorder 2. Asthma 3. Nephrolithiasis 4. Diverticulosis 5. Colon polyps 6. Meniere's disease 7. Fibromyalgia 8. Hiatal hernia with GERD 9. H/o cholecystectomy 10. H/o appendectomy  SH: Lives alone in [place].  Disabled.  Smokes 1/2 ppd.   FH: Brother with PCI at age 65, mother with MI in her 42s.   ROS: All systems reviewed and negative except as per HPI.   Current Outpatient Prescriptions  Medication Sig Dispense Refill  . divalproex (DEPAKOTE) 250 MG DR tablet Take 250 mg by mouth 2 (two) times daily.      Marland Kitchen gabapentin (NEURONTIN) 300 MG capsule Take 300 mg by mouth 2 (two) times daily.      Marland Kitchen lamoTRIgine (LAMICTAL) 200 MG tablet Take 200 mg by mouth daily.      . risperiDONE (RISPERDAL) 2 MG tablet Take 2 mg by mouth at bedtime.      . topiramate (TOPAMAX) 100 MG tablet Take 100 mg by mouth 2 (two) times daily. For appetite control and could help with mood mamangement      . traZODone (DESYREL) 150 MG tablet Take 150 mg by mouth at bedtime.      . traZODone (DESYREL) 50 MG tablet Take 1  tablet (50 mg total) by mouth at bedtime. For sleep.  30 tablet  0  . DISCONTD: albuterol (PROVENTIL) (2.5 MG/3ML) 0.083% nebulizer solution Take 2.5 mg by nebulization every 6 (six) hours as needed. For wheezing, allergies, and/or shortness of breath      . DISCONTD: estrogens, conjugated, (PREMARIN) 0.625 MG tablet Take 0.625 mg by mouth daily.       Marland Kitchen DISCONTD: omeprazole (PRILOSEC) 20 MG capsule Take 20 mg by mouth every morning.        BP 126/98  Pulse 69  Ht 5\' 6"  (1.676 m)  Wt 216 lb 12.8 oz (98.34 kg)  BMI 34.99 kg/m2  SpO2 98% General: NAD Neck: No JVD, no thyromegaly or thyroid nodule.  Lungs: Clear to auscultation bilaterally with normal respiratory effort. CV: Nondisplaced PMI.  Heart regular S1/S2, no S3/S4, no murmur.  No peripheral edema.  No carotid bruit.  Normal pedal pulses.  Abdomen: Soft, nontender, no hepatosplenomegaly, no distention.  Skin: Intact without lesions or rashes.  Neurologic: Alert and oriented x 3.  Psych: Normal affect. Extremities: No clubbing or cyanosis.  HEENT: Normal.   Assessment/Plan: 1. Chest pain: Patient has exertional chest heaviness and dyspnea.  She is only 83, but her mother  had an MI in her 39s and her brother had PCI at 36.  She smokes. I think that she needs risk stratification.  I will arrange for ETT-myoview.  She should take ASA 81 mg daily.  I will check her lipids. 2. Dyspnea: This could be due to ischemic diastolic dysfunction.  It has been occurring in conjunction with chest heaviness.  She was recently started on Lasix because of lower extremity swelling.  She does not have edema now and JVP is not elevated.  I will assess LV systolic function with echo and will also get a BNP.  3. Smoking: I strongly encouraged her to quit smoking.  She will consider a nicotine patch.   Marca Ancona 05/01/2012

## 2012-05-03 ENCOUNTER — Other Ambulatory Visit: Payer: Self-pay | Admitting: *Deleted

## 2012-05-03 DIAGNOSIS — R0600 Dyspnea, unspecified: Secondary | ICD-10-CM

## 2012-05-03 DIAGNOSIS — R079 Chest pain, unspecified: Secondary | ICD-10-CM

## 2012-05-06 ENCOUNTER — Other Ambulatory Visit: Payer: Self-pay

## 2012-05-06 ENCOUNTER — Other Ambulatory Visit (INDEPENDENT_AMBULATORY_CARE_PROVIDER_SITE_OTHER): Payer: Self-pay

## 2012-05-06 DIAGNOSIS — R0602 Shortness of breath: Secondary | ICD-10-CM

## 2012-05-06 DIAGNOSIS — R079 Chest pain, unspecified: Secondary | ICD-10-CM

## 2012-05-07 ENCOUNTER — Encounter (HOSPITAL_COMMUNITY): Payer: Self-pay

## 2012-05-07 ENCOUNTER — Encounter (HOSPITAL_COMMUNITY): Payer: Self-pay | Admitting: Cardiology

## 2012-05-07 ENCOUNTER — Ambulatory Visit (HOSPITAL_COMMUNITY)
Admission: RE | Admit: 2012-05-07 | Discharge: 2012-05-07 | Disposition: A | Discharge status: Home / Self Care | Payer: Medicaid Other | Source: Ambulatory Visit | Attending: Cardiology | Admitting: Cardiology

## 2012-05-07 ENCOUNTER — Encounter (HOSPITAL_COMMUNITY)
Admission: RE | Admit: 2012-05-07 | Discharge: 2012-05-07 | Disposition: A | Discharge status: Home / Self Care | Payer: Medicaid Other | Source: Ambulatory Visit | Attending: Cardiology | Admitting: Cardiology

## 2012-05-07 ENCOUNTER — Other Ambulatory Visit: Payer: Self-pay | Admitting: Cardiology

## 2012-05-07 DIAGNOSIS — R0600 Dyspnea, unspecified: Secondary | ICD-10-CM

## 2012-05-07 DIAGNOSIS — R079 Chest pain, unspecified: Secondary | ICD-10-CM | POA: Insufficient documentation

## 2012-05-07 DIAGNOSIS — R0609 Other forms of dyspnea: Secondary | ICD-10-CM | POA: Insufficient documentation

## 2012-05-07 DIAGNOSIS — R0989 Other specified symptoms and signs involving the circulatory and respiratory systems: Secondary | ICD-10-CM | POA: Insufficient documentation

## 2012-05-07 DIAGNOSIS — R0789 Other chest pain: Secondary | ICD-10-CM

## 2012-05-07 LAB — LIPID PANEL
Cholesterol: 180 mg/dL (ref 0–200)
HDL: 39 mg/dL — ABNORMAL LOW (ref >39)
Total CHOL/HDL Ratio: 4.6 Ratio
Triglycerides: 165 mg/dL — ABNORMAL HIGH (ref <150)

## 2012-05-07 LAB — BASIC METABOLIC PANEL
Calcium: 9.7 mg/dL (ref 8.4–10.5)
Creat: 0.82 mg/dL (ref 0.50–1.10)
Sodium: 139 mEq/L (ref 135–145)

## 2012-05-07 LAB — TSH: TSH: 3.781 u[IU]/mL (ref 0.350–4.500)

## 2012-05-07 MED ORDER — REGADENOSON 0.4 MG/5ML IV SOLN
INTRAVENOUS | Status: AC
  Filled 2012-05-07: qty 5

## 2012-05-07 MED ORDER — TECHNETIUM TC 99M SESTAMIBI - CARDIOLITE
10.0000 | Freq: Once | INTRAVENOUS | Status: AC | PRN
  Administered 2012-05-07: 09:00:00 10 via INTRAVENOUS

## 2012-05-07 MED ORDER — TECHNETIUM TC 99M SESTAMIBI - CARDIOLITE
30.0000 | Freq: Once | INTRAVENOUS | Status: AC | PRN
  Administered 2012-05-07: 11:00:00 31 via INTRAVENOUS

## 2012-05-07 MED ORDER — SODIUM CHLORIDE 0.9 % IJ SOLN
INTRAMUSCULAR | Status: AC
  Administered 2012-05-07: 10 mL via INTRAVENOUS
  Filled 2012-05-07: qty 10

## 2012-05-07 NOTE — Progress Notes (Signed)
Stress Lab Nurses Notes - Allison Bolton  Allison Bolton 05/07/2012 Reason for doing test: Chest Pain and Dyspnea Type of test: Stress Cardiolite Nurse performing test: Parke Poisson, RN Nuclear Medicine Tech: Lou Cal Echo Tech: Not Applicable MD performing test: R. Rothbart Family MD: Hasanaj Test explained and consent signed: yes IV started: 22g jelco, Saline lock flushed, No redness or edema and Saline lock started in radiology Symptoms: SOB Treatment/Intervention: None Reason test stopped: fatigue After recovery IV was: Discontinued via X-ray tech and No redness or edema Patient to return to Nuc. Med at : 11:30 Patient discharged: Home Patient's Condition upon discharge was: stable Comments: During test peak BP 135/72 & HR 139.  Recovery BP  98/68 & HR 69.  Symptoms resolved in recovery. Erskine [place] T

## 2012-05-10 ENCOUNTER — Telehealth: Payer: Self-pay | Admitting: *Deleted

## 2012-05-10 NOTE — Telephone Encounter (Signed)
Patient notified of stable labs, echo, and stress test.  Already has follow up scheduled for 11/12 with Dr. Shirlee Latch.

## 2012-05-18 ENCOUNTER — Ambulatory Visit (INDEPENDENT_AMBULATORY_CARE_PROVIDER_SITE_OTHER): Payer: Medicaid Other | Admitting: Cardiology

## 2012-05-18 ENCOUNTER — Encounter: Payer: Self-pay | Admitting: Cardiology

## 2012-05-18 VITALS — BP 94/69 | HR 79 | Ht 66.0 in | Wt 214.0 lb

## 2012-05-18 DIAGNOSIS — R0989 Other specified symptoms and signs involving the circulatory and respiratory systems: Secondary | ICD-10-CM

## 2012-05-18 DIAGNOSIS — R0600 Dyspnea, unspecified: Secondary | ICD-10-CM

## 2012-05-18 DIAGNOSIS — R079 Chest pain, unspecified: Secondary | ICD-10-CM

## 2012-05-18 DIAGNOSIS — F17200 Nicotine dependence, unspecified, uncomplicated: Secondary | ICD-10-CM

## 2012-05-18 DIAGNOSIS — R0609 Other forms of dyspnea: Secondary | ICD-10-CM

## 2012-05-18 MED ORDER — ASPIRIN EC 81 MG PO TBEC: 81.0000 mg | DELAYED_RELEASE_TABLET | Freq: Every day | ORAL | Status: DC

## 2012-05-18 NOTE — Progress Notes (Signed)
Patient ID: Allison Bolton, female   DOB: 1971/04/06, 41 y.o.   MRN: [id number] PCP: Dr. Olena Leatherwood  41 yo smoker with history of bipolar disorder returns for cardiology followup.  For the couple of months prior to the initial appointment, she had been getting chest heaviness and dyspnea while walking.  This would occur after she walked for about 10 minutes and would resolve with rest.  She would also get chest heaviness and dyspnea walking up hills. She also had had lower extremity edema for the last couple of months.  She was started on Lasix 20 mg daily with resolution of edema.  The Lasix did not effect the exertional chest pain/dyspnea and she actually has stopped it.  She continues to smoke but has been trying to cut back.  Her brother had a stent at age 72.  She has had 2 visits to the ER in the last couple of months with chest pain.    Since last appointment, she says that she has not had any further chest tightness or pain.  No further exertional dyspnea.  She is unable to pinpoint why the symptoms stopped, they just seem to have improved spontaneously.  I had her do an ETT-myoview which was a submaximal study but which showed no evidence for ischemia or infarction.  Echo showed normal EF and no other significant abnormalities.   Labs (11/13): TSH normal, BNP < 2, LDL 108, HDL 39, K 4, creatinine 0.82  PMH: 1. Bipolar disorder 2. Asthma 3. Nephrolithiasis 4. Diverticulosis 5. Colon polyps 6. Meniere's disease 7. Fibromyalgia 8. Hiatal hernia with GERD 9. H/o cholecystectomy 10. H/o appendectomy 11. Chest pain: ETT-myoview (10/13) with submaximal exercise, nonsignificant ECG changes, EF 65%, breast attenuation but no evidence for ischemia or infarction.  Echo (10/13) showed EF 65%, no significant valvular abnormalities.   SH: Lives alone in [place].  Disabled.  Smokes 1/2 ppd.   FH: Brother with PCI at age 61, mother with MI in her 18s.    Current Outpatient Prescriptions  Medication  Sig Dispense Refill  . divalproex (DEPAKOTE) 250 MG DR tablet Take 250 mg by mouth 2 (two) times daily.      Marland Kitchen gabapentin (NEURONTIN) 300 MG capsule Take 300 mg by mouth 2 (two) times daily.      Marland Kitchen lamoTRIgine (LAMICTAL) 200 MG tablet Take 200 mg by mouth daily.      . risperiDONE (RISPERDAL) 2 MG tablet Take 2 mg by mouth at bedtime.      . topiramate (TOPAMAX) 100 MG tablet Take 100 mg by mouth 2 (two) times daily. For appetite control and could help with mood mamangement      . traZODone (DESYREL) 150 MG tablet Take 150 mg by mouth at bedtime.      Marland Kitchen aspirin EC 81 MG tablet Take 1 tablet (81 mg total) by mouth daily.      . [DISCONTINUED] albuterol (PROVENTIL) (2.5 MG/3ML) 0.083% nebulizer solution Take 2.5 mg by nebulization every 6 (six) hours as needed. For wheezing, allergies, and/or shortness of breath      . [DISCONTINUED] estrogens, conjugated, (PREMARIN) 0.625 MG tablet Take 0.625 mg by mouth daily.       . [DISCONTINUED] omeprazole (PRILOSEC) 20 MG capsule Take 20 mg by mouth every morning.       . [DISCONTINUED] traZODone (DESYREL) 50 MG tablet Take 1 tablet (50 mg total) by mouth at bedtime. For sleep.  30 tablet  0   BP 94/69  Pulse 79  Ht 5\' 6"  (1.676 m)  Wt 214 lb (97.07 kg)  BMI 34.54 kg/m2  SpO2 97% General: NAD Neck: No JVD, no thyromegaly or thyroid nodule.  Lungs: Clear to auscultation bilaterally with normal respiratory effort. CV: Nondisplaced PMI.  Heart regular S1/S2, no S3/S4, no murmur.  No peripheral edema.  No carotid bruit.  Normal pedal pulses.  Abdomen: Soft, nontender, no hepatosplenomegaly, no distention.  Skin: Intact without lesions or rashes.  Neurologic: Alert and oriented x 3.  Psych: Normal affect. Extremities: No clubbing or cyanosis.  HEENT: Normal.   Assessment/Plan: 1. Chest pain: At last appointment, patient reported exertional chest heaviness and dyspnea.  She is only 41, but her mother had an MI in her 58s and her brother had PCI at 4.   She smokes.  I had her do an ETT-myoview.  This was a submaximal study but showed no ischemia or infarction.  As she is actually not having chest pain or dyspneic symptoms any longer, I will not do further ischemic workup at this time.  She should continue ASA 81 mg daily.  If she has recurrent chest pain symptoms, would consider doing a coronary CT angiogram.  2. Dyspnea: Normal BNP, no significant abnormalities on echo.  Possibly due to smoking and obesity/deconditioning.   3. Smoking: I strongly encouraged her again to quit smoking.  She will try the nicotine patch.   Marca Ancona 05/18/2012

## 2012-05-18 NOTE — Patient Instructions (Signed)
   Begin Aspirin 81mg daily Continue all other current medications. Your physician wants you to follow up in:  1 year.  You will receive a reminder letter in the mail one-two months in advance.  If you don't receive a letter, please call our office to schedule the follow up appointment   

## 2012-08-09 ENCOUNTER — Emergency Department (HOSPITAL_COMMUNITY)
Admission: EM | Admit: 2012-08-09 | Discharge: 2012-08-10 | Disposition: A | Discharge status: Home / Self Care | Payer: MEDICAID | Source: Home / Self Care

## 2012-08-09 ENCOUNTER — Encounter (HOSPITAL_COMMUNITY): Payer: Self-pay | Admitting: *Deleted

## 2012-08-09 DIAGNOSIS — R45851 Suicidal ideations: Secondary | ICD-10-CM | POA: Insufficient documentation

## 2012-08-09 DIAGNOSIS — E876 Hypokalemia: Secondary | ICD-10-CM

## 2012-08-09 DIAGNOSIS — F17200 Nicotine dependence, unspecified, uncomplicated: Secondary | ICD-10-CM | POA: Insufficient documentation

## 2012-08-09 DIAGNOSIS — Z9889 Other specified postprocedural states: Secondary | ICD-10-CM | POA: Insufficient documentation

## 2012-08-09 DIAGNOSIS — Z79899 Other long term (current) drug therapy: Secondary | ICD-10-CM | POA: Insufficient documentation

## 2012-08-09 DIAGNOSIS — Z87442 Personal history of urinary calculi: Secondary | ICD-10-CM | POA: Insufficient documentation

## 2012-08-09 DIAGNOSIS — F3289 Other specified depressive episodes: Secondary | ICD-10-CM | POA: Insufficient documentation

## 2012-08-09 DIAGNOSIS — F32A Depression, unspecified: Secondary | ICD-10-CM

## 2012-08-09 DIAGNOSIS — F329 Major depressive disorder, single episode, unspecified: Secondary | ICD-10-CM | POA: Insufficient documentation

## 2012-08-09 DIAGNOSIS — Z8669 Personal history of other diseases of the nervous system and sense organs: Secondary | ICD-10-CM | POA: Insufficient documentation

## 2012-08-09 DIAGNOSIS — J45909 Unspecified asthma, uncomplicated: Secondary | ICD-10-CM

## 2012-08-09 DIAGNOSIS — Z8739 Personal history of other diseases of the musculoskeletal system and connective tissue: Secondary | ICD-10-CM | POA: Insufficient documentation

## 2012-08-09 DIAGNOSIS — F319 Bipolar disorder, unspecified: Secondary | ICD-10-CM | POA: Insufficient documentation

## 2012-08-09 DIAGNOSIS — Z8719 Personal history of other diseases of the digestive system: Secondary | ICD-10-CM | POA: Insufficient documentation

## 2012-08-09 LAB — URINALYSIS, ROUTINE W REFLEX MICROSCOPIC
Bilirubin Urine: NEGATIVE
Glucose, UA: NEGATIVE mg/dL
Hgb urine dipstick: NEGATIVE
Protein, ur: NEGATIVE mg/dL
Specific Gravity, Urine: >1.03 — ABNORMAL HIGH (ref 1.005–1.030)

## 2012-08-09 LAB — BASIC METABOLIC PANEL
BUN: 13 mg/dL (ref 6–23)
Creatinine, Ser: 0.75 mg/dL (ref 0.50–1.10)
GFR calc non Af Amer: >90 mL/min (ref >90)
Glucose, Bld: 96 mg/dL (ref 70–99)
Potassium: 3.3 mEq/L — ABNORMAL LOW (ref 3.5–5.1)

## 2012-08-09 LAB — CBC WITH DIFFERENTIAL/PLATELET
Basophils Relative: 0 % (ref 0–1)
Eosinophils Absolute: 0.2 10*3/uL (ref 0.0–0.7)
HCT: 40.8 % (ref 36.0–46.0)
Hemoglobin: 13.9 g/dL (ref 12.0–15.0)
Lymphs Abs: 3 10*3/uL (ref 0.7–4.0)
MCH: 29.2 pg (ref 26.0–34.0)
MCHC: 34.1 g/dL (ref 30.0–36.0)
MCV: 85.7 fL (ref 78.0–100.0)
Monocytes Absolute: 1 10*3/uL (ref 0.1–1.0)
Monocytes Relative: 7 % (ref 3–12)
Neutrophils Relative %: 70 % (ref 43–77)
RBC: 4.76 MIL/uL (ref 3.87–5.11)

## 2012-08-09 LAB — RAPID URINE DRUG SCREEN, HOSP PERFORMED
Amphetamines: POSITIVE — AB
Barbiturates: NOT DETECTED
Cocaine: NOT DETECTED
Opiates: NOT DETECTED
Tetrahydrocannabinol: NOT DETECTED

## 2012-08-09 MED ORDER — TOPIRAMATE 25 MG PO TABS
ORAL_TABLET | ORAL | Status: AC
  Filled 2012-08-09: qty 4

## 2012-08-09 MED ORDER — NICOTINE 21 MG/24HR TD PT24
21.0000 mg | MEDICATED_PATCH | Freq: Every day | TRANSDERMAL | Status: DC
  Administered 2012-08-09: 21 mg via TRANSDERMAL
  Filled 2012-08-09: qty 1

## 2012-08-09 MED ORDER — TRAZODONE HCL 50 MG PO TABS
150.0000 mg | ORAL_TABLET | Freq: Every day | ORAL | Status: DC
  Administered 2012-08-09: 150 mg via ORAL
  Filled 2012-08-09: qty 3

## 2012-08-09 MED ORDER — LAMOTRIGINE 25 MG PO TABS
ORAL_TABLET | ORAL | Status: AC
  Filled 2012-08-09: qty 7

## 2012-08-09 MED ORDER — ALBUTEROL SULFATE HFA 108 (90 BASE) MCG/ACT IN AERS
2.0000 | INHALATION_SPRAY | RESPIRATORY_TRACT | Status: DC | PRN
  Administered 2012-08-09: 2 via RESPIRATORY_TRACT
  Filled 2012-08-09: qty 6.7

## 2012-08-09 MED ORDER — TOPIRAMATE 100 MG PO TABS
100.0000 mg | ORAL_TABLET | Freq: Two times a day (BID) | ORAL | Status: DC
  Administered 2012-08-09: 100 mg via ORAL
  Filled 2012-08-09 (×4): qty 1

## 2012-08-09 MED ORDER — RISPERIDONE 1 MG PO TABS
2.0000 mg | ORAL_TABLET | Freq: Every day | ORAL | Status: DC
  Administered 2012-08-09: 2 mg via ORAL
  Filled 2012-08-09: qty 2

## 2012-08-09 MED ORDER — ZOLPIDEM TARTRATE 5 MG PO TABS: 10.0000 mg | ORAL_TABLET | Freq: Every evening | ORAL | Status: DC | PRN

## 2012-08-09 MED ORDER — ONDANSETRON HCL 4 MG PO TABS: 4.0000 mg | ORAL_TABLET | Freq: Three times a day (TID) | ORAL | Status: DC | PRN

## 2012-08-09 MED ORDER — ALUM & MAG HYDROXIDE-SIMETH 200-200-20 MG/5ML PO SUSP: 30.0000 mL | ORAL | Status: DC | PRN

## 2012-08-09 MED ORDER — POTASSIUM CHLORIDE CRYS ER 20 MEQ PO TBCR
40.0000 meq | EXTENDED_RELEASE_TABLET | Freq: Three times a day (TID) | ORAL | Status: DC
  Administered 2012-08-09: 40 meq via ORAL
  Filled 2012-08-09: qty 2

## 2012-08-09 MED ORDER — IBUPROFEN 400 MG PO TABS: 600.0000 mg | ORAL_TABLET | Freq: Three times a day (TID) | ORAL | Status: DC | PRN

## 2012-08-09 MED ORDER — LAMOTRIGINE 100 MG PO TABS
200.0000 mg | ORAL_TABLET | Freq: Every day | ORAL | Status: DC
  Administered 2012-08-09: 200 mg via ORAL
  Filled 2012-08-09 (×2): qty 2

## 2012-08-09 MED ORDER — GABAPENTIN 300 MG PO CAPS
300.0000 mg | ORAL_CAPSULE | Freq: Two times a day (BID) | ORAL | Status: DC
  Administered 2012-08-09: 300 mg via ORAL
  Filled 2012-08-09 (×4): qty 1

## 2012-08-09 MED ORDER — ACETAMINOPHEN 325 MG PO TABS: 650.0000 mg | ORAL_TABLET | ORAL | Status: DC | PRN

## 2012-08-09 MED ORDER — GABAPENTIN 300 MG PO CAPS
ORAL_CAPSULE | ORAL | Status: AC
  Filled 2012-08-09: qty 1

## 2012-08-09 MED ORDER — LORAZEPAM 1 MG PO TABS
1.0000 mg | ORAL_TABLET | Freq: Three times a day (TID) | ORAL | Status: DC | PRN
  Administered 2012-08-09: 1 mg via ORAL
  Filled 2012-08-09: qty 1

## 2012-08-09 MED ORDER — ZOLPIDEM TARTRATE 5 MG PO TABS
5.0000 mg | ORAL_TABLET | Freq: Every evening | ORAL | Status: DC | PRN
  Administered 2012-08-09: 5 mg via ORAL
  Filled 2012-08-09: qty 1

## 2012-08-09 NOTE — ED Notes (Signed)
SI today,thinking of OD and "slicing my wrists.".
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
## 2012-08-09 NOTE — ED Provider Notes (Signed)
History   This chart was scribed for Ward Givens, MD by Charolett Bumpers, ED Scribe. The patient was seen in room [facility]. Patient's care was started at 1811.    CSN: [id number]  Arrival date & time 08/09/12  1719   First MD Initiated Contact with Patient 08/09/12 1811      Chief Complaint  Patient presents with  . V70.1     The history is provided by the patient. No language interpreter was used.   Allison Bolton is a 42 y.o. female who presents to the Emergency Department complaining of persistent, gradually worsening suicidal ideations that started today. She has a h/o Bipolar 1 disorder and states that she has recently been depressed. She states that her 76 year old daughter has been in foster care for the past year but has recently been put up for adoption and she was unaware they were going to have her adopted. She states that her daughter was taken away after being in a dangerous relationship with a boyfriend. Her daughter's father is in [place]. She states that her plan for suicidal is to either overdose or cut her wrists. She states that she has attempted 3-4 times in the past by overdose and cutting her wrist. She states that she is taking her medications regularly and has not missed any doses. Pt is accompanied by ACT team member from [facility] who she f/u with almost daily. She denies any physical complaints and denies any recent illness. She has a h/o asthma and normally uses an albuterol inhaler.   Dr. Rosalia Hammers at [facility] PCP Dr Olena Leatherwood  Past Medical History  Diagnosis Date  . Asthma   . Bipolar 1 disorder   . Diverticulosis   . Arthritis   . S/P colonoscopy     Dr. Karilyn Cota 2010: few small diverticula at sigmoid. otherwise normal.   . Dysrhythmia     sts "I have heart palpitations"  . Shortness of breath   . Meniere's disease   . Headache   . H/O hiatal hernia     4  . Fibromyalgia   . Kidney stones     Past Surgical History  Procedure Date  .  Tonsillectomy and adenoidectomy   . Umbilical hernia repair Dec 2011    Dr. Lovell Sheehan  . Abdominal hysterectomy   . Hemorrhoid surgery   . Cholecystectomy   . Foot surgery   . Left ovarian removal   . Cesarean section   . Right ovarian removal   . Exploratory laparoscopy   . Wisdom teeth removal   . Multiple hernia repairs   . Tubes in ears   . Tonsillectomy   . Laparoscopy 02/19/2011    Procedure: LAPAROSCOPY DIAGNOSTIC;  Surgeon: Dalia Heading;  Location: [facility];  Service: General;  Laterality: N/A;  . Laparoscopic appendectomy 02/19/2011    Procedure: APPENDECTOMY LAPAROSCOPIC;  Surgeon: Dalia Heading;  Location: [facility];  Service: General;  Laterality: N/A;  . Appendectomy     Family History  Problem Relation Age of Onset  . Thyroid disease Mother   . Colon cancer      paternal and maternal grandfather  . Anesthesia problems Neg Hx   . Hypotension Neg Hx   . Malignant hyperthermia Neg Hx   . Pseudochol deficiency Neg Hx   . Meniere's disease Other     History  Substance Use Topics  . Smoking status: Current Every Day Smoker -- 0.5 packs/day for 20 years  Types: Cigarettes    Start date: 07/07/1981  . Smokeless tobacco: Never Used  . Alcohol Use: No     Comment: not since June 2012  She admits to smoking Denies alcohol use She lives alone Unemployed and on disability.   OB History    Grav Para Term Preterm Abortions TAB SAB Ect Mult Living   5 2 2  3  2   2       Review of Systems  Psychiatric/Behavioral: Positive for suicidal ideas and dysphoric mood. Negative for self-injury.  All other systems reviewed and are negative.    Allergies  Amoxicillin  Home Medications   Current Outpatient Rx  Name  Route  Sig  Dispense  Refill  . GABAPENTIN 300 MG PO CAPS   Oral   Take 300 mg by mouth 2 (two) times daily.         Marland Kitchen LAMOTRIGINE 200 MG PO TABS   Oral   Take 200 mg by mouth at bedtime.          Marland Kitchen RISPERIDONE 2 MG PO TABS   Oral   Take 2 mg  by mouth at bedtime.         . TOPIRAMATE 100 MG PO TABS   Oral   Take 100 mg by mouth 2 (two) times daily. For appetite control and could help with mood mamangement         . TRAZODONE HCL 150 MG PO TABS   Oral   Take 150 mg by mouth at bedtime.           Triage Vitals: BP 102/88  Pulse 70  Temp 97.3 F (36.3 C) (Oral)  Resp 20  Ht 5\' 6"  (1.676 m)  Wt 200 lb (90.719 kg)  BMI 32.28 kg/m2  SpO2 99%  Vital signs normal    Physical Exam  Nursing note and vitals reviewed. Constitutional: She is oriented to person, place, and time. She appears well-developed and well-nourished. No distress.  HENT:  Head: Normocephalic and atraumatic.  Right Ear: External ear normal.  Left Ear: External ear normal.  Nose: Nose normal.  Mouth/Throat: Oropharynx is clear and moist. No oropharyngeal exudate.  Eyes: Conjunctivae normal and EOM are normal. Pupils are equal, round, and reactive to light.  Neck: Normal range of motion. Neck supple. No tracheal deviation present.  Cardiovascular: Normal rate, regular rhythm and normal heart sounds.  Exam reveals no gallop and no friction rub.   No murmur heard. Pulmonary/Chest: Effort normal. No respiratory distress. She has wheezes. She has rhonchi. She has no rales.       Scattered wheezes and rhonchi.  Abdominal: Soft. Bowel sounds are normal. She exhibits no distension. There is no tenderness. There is no rebound and no guarding.  Musculoskeletal: Normal range of motion. She exhibits no edema and no tenderness.  Neurological: She is alert and oriented to person, place, and time.  Skin: Skin is warm and dry.  Psychiatric: Her behavior is normal.       Flat affect.     ED Course  Procedures (including critical care time)   Medications  potassium chloride SA (K-DUR,KLOR-CON) CR tablet 40 mEq (not administered)  albuterol (PROVENTIL HFA;VENTOLIN HFA) 108 (90 BASE) MCG/ACT inhaler 2 puff (not administered)     DIAGNOSTIC  STUDIES: Oxygen Saturation is 99% on room air, normal by my interpretation.    COORDINATION OF CARE:  18:45-Discussed planned course of treatment with the patient, who is agreeable at this time.  19:55-Recheck: Informed pt of lab results. Will start pt on Potassium.  PT has been assessed by Samson Frederic.   Results for orders placed during the hospital encounter of 08/09/12  CBC WITH DIFFERENTIAL      Component Value Range   WBC 14.5 (*) 4.0 - 10.5 K/uL   RBC 4.76  3.87 - 5.11 MIL/uL   Hemoglobin 13.9  12.0 - 15.0 g/dL   HCT 96.2  95.2 - 84.1 %   MCV 85.7  78.0 - 100.0 fL   MCH 29.2  26.0 - 34.0 pg   MCHC 34.1  30.0 - 36.0 g/dL   RDW 32.4  40.1 - 02.7 %   Platelets 285  150 - 400 K/uL   Neutrophils Relative 70  43 - 77 %   Neutro Abs 10.2 (*) 1.7 - 7.7 K/uL   Lymphocytes Relative 21  12 - 46 %   Lymphs Abs 3.0  0.7 - 4.0 K/uL   Monocytes Relative 7  3 - 12 %   Monocytes Absolute 1.0  0.1 - 1.0 K/uL   Eosinophils Relative 1  0 - 5 %   Eosinophils Absolute 0.2  0.0 - 0.7 K/uL   Basophils Relative 0  0 - 1 %   Basophils Absolute 0.0  0.0 - 0.1 K/uL  BASIC METABOLIC PANEL      Component Value Range   Sodium 138  135 - 145 mEq/L   Potassium 3.3 (*) 3.5 - 5.1 mEq/L   Chloride 102  96 - 112 mEq/L   CO2 25  19 - 32 mEq/L   Glucose, Bld 96  70 - 99 mg/dL   BUN 13  6 - 23 mg/dL   Creatinine, Ser 2.53  0.50 - 1.10 mg/dL   Calcium 9.6  8.4 - 66.4 mg/dL   GFR calc non Af Amer >90  >90 mL/min   GFR calc Af Amer >90  >90 mL/min  ETHANOL      Component Value Range   Alcohol, Ethyl (B) <11  0 - 11 mg/dL  URINALYSIS, ROUTINE W REFLEX MICROSCOPIC      Component Value Range   Color, Urine YELLOW  YELLOW   APPearance CLEAR  CLEAR   Specific Gravity, Urine >1.030 (*) 1.005 - 1.030   pH 6.0  5.0 - 8.0   Glucose, UA NEGATIVE  NEGATIVE mg/dL   Hgb urine dipstick NEGATIVE  NEGATIVE   Bilirubin Urine NEGATIVE  NEGATIVE   Ketones, ur NEGATIVE  NEGATIVE mg/dL   Protein, ur NEGATIVE  NEGATIVE  mg/dL   Urobilinogen, UA 0.2  0.0 - 1.0 mg/dL   Nitrite NEGATIVE  NEGATIVE   Leukocytes, UA NEGATIVE  NEGATIVE  URINE RAPID DRUG SCREEN (HOSP PERFORMED)      Component Value Range   Opiates NONE DETECTED  NONE DETECTED   Cocaine NONE DETECTED  NONE DETECTED   Benzodiazepines NONE DETECTED  NONE DETECTED   Amphetamines POSITIVE (*) NONE DETECTED   Tetrahydrocannabinol NONE DETECTED  NONE DETECTED   Barbiturates NONE DETECTED  NONE DETECTED   Laboratory interpretation all normal except leukocytosis, mild hypokalemia, concentrated urine, positive UDS     1. Depression   2. Suicidal ideation   3. Hypokalemia   4. Asthma     Plan psychiatric admission   Devoria Albe, MD, FACEP   MDM     I personally performed the services described in this documentation, which was scribed in my presence. The recorded information has been reviewed and considered.  Devoria Albe,  MD, Franz Dell, MD 08/09/12 [phone]

## 2012-08-10 ENCOUNTER — Encounter (HOSPITAL_COMMUNITY): Payer: Self-pay

## 2012-08-10 ENCOUNTER — Inpatient Hospital Stay (HOSPITAL_COMMUNITY)
Admission: EM | Admit: 2012-08-10 | Discharge: 2012-08-13 | DRG: 885 | Disposition: A | Discharge status: Home / Self Care | Payer: MEDICAID | Source: Intra-hospital | Attending: Psychiatry | Admitting: Psychiatry

## 2012-08-10 DIAGNOSIS — F3130 Bipolar disorder, current episode depressed, mild or moderate severity, unspecified: Principal | ICD-10-CM

## 2012-08-10 DIAGNOSIS — K769 Liver disease, unspecified: Secondary | ICD-10-CM

## 2012-08-10 DIAGNOSIS — F419 Anxiety disorder, unspecified: Secondary | ICD-10-CM | POA: Diagnosis present

## 2012-08-10 DIAGNOSIS — R45851 Suicidal ideations: Secondary | ICD-10-CM

## 2012-08-10 DIAGNOSIS — F17200 Nicotine dependence, unspecified, uncomplicated: Secondary | ICD-10-CM

## 2012-08-10 DIAGNOSIS — J45909 Unspecified asthma, uncomplicated: Secondary | ICD-10-CM | POA: Diagnosis present

## 2012-08-10 DIAGNOSIS — Z79899 Other long term (current) drug therapy: Secondary | ICD-10-CM

## 2012-08-10 DIAGNOSIS — F411 Generalized anxiety disorder: Secondary | ICD-10-CM | POA: Diagnosis present

## 2012-08-10 MED ORDER — ALUM & MAG HYDROXIDE-SIMETH 200-200-20 MG/5ML PO SUSP: 30.0000 mL | ORAL | Status: DC | PRN

## 2012-08-10 MED ORDER — ACETAMINOPHEN 325 MG PO TABS: 650.0000 mg | ORAL_TABLET | Freq: Four times a day (QID) | ORAL | Status: DC | PRN

## 2012-08-10 MED ORDER — TRAZODONE HCL 50 MG PO TABS: 50.0000 mg | ORAL_TABLET | Freq: Every evening | ORAL | Status: DC | PRN

## 2012-08-10 MED ORDER — ZOLPIDEM TARTRATE 5 MG PO TABS: 5.0000 mg | ORAL_TABLET | Freq: Once | ORAL | Status: DC

## 2012-08-10 MED ORDER — MAGNESIUM HYDROXIDE 400 MG/5ML PO SUSP: 30.0000 mL | Freq: Every day | ORAL | Status: DC | PRN

## 2012-08-10 MED ORDER — ZOLPIDEM TARTRATE 5 MG PO TABS
5.0000 mg | ORAL_TABLET | Freq: Every evening | ORAL | Status: DC | PRN
  Administered 2012-08-10 – 2012-08-12 (×3): 5 mg via ORAL
  Filled 2012-08-10 (×3): qty 1

## 2012-08-10 MED ORDER — GABAPENTIN 300 MG PO CAPS
300.0000 mg | ORAL_CAPSULE | Freq: Two times a day (BID) | ORAL | Status: DC
  Administered 2012-08-10 – 2012-08-13 (×7): 300 mg via ORAL
  Filled 2012-08-10 (×2): qty 1
  Filled 2012-08-10: qty 3
  Filled 2012-08-10 (×8): qty 1

## 2012-08-10 MED ORDER — RISPERIDONE 2 MG PO TABS
2.0000 mg | ORAL_TABLET | Freq: Every day | ORAL | Status: DC
  Administered 2012-08-10 – 2012-08-12 (×3): 2 mg via ORAL
  Filled 2012-08-10 (×5): qty 1

## 2012-08-10 MED ORDER — HYDROXYZINE HCL 25 MG PO TABS
25.0000 mg | ORAL_TABLET | Freq: Four times a day (QID) | ORAL | Status: DC | PRN
  Administered 2012-08-10 – 2012-08-13 (×6): 25 mg via ORAL

## 2012-08-10 MED ORDER — LORAZEPAM 1 MG PO TABS: 1.0000 mg | ORAL_TABLET | Freq: Once | ORAL | Status: DC

## 2012-08-10 MED ORDER — TOPIRAMATE 100 MG PO TABS
100.0000 mg | ORAL_TABLET | Freq: Two times a day (BID) | ORAL | Status: DC
  Administered 2012-08-10 – 2012-08-13 (×7): 100 mg via ORAL
  Filled 2012-08-10 (×11): qty 1

## 2012-08-10 MED ORDER — TRAZODONE HCL 150 MG PO TABS
150.0000 mg | ORAL_TABLET | Freq: Every day | ORAL | Status: DC
  Filled 2012-08-10 (×2): qty 1

## 2012-08-10 MED ORDER — ZOLPIDEM TARTRATE 10 MG PO TABS: 10.0000 mg | ORAL_TABLET | Freq: Every evening | ORAL | Status: DC | PRN

## 2012-08-10 MED ORDER — PNEUMOCOCCAL VAC POLYVALENT 25 MCG/0.5ML IJ INJ
0.5000 mL | INJECTION | INTRAMUSCULAR | Status: AC
  Administered 2012-08-11: 0.5 mL via INTRAMUSCULAR

## 2012-08-10 MED ORDER — LAMOTRIGINE 200 MG PO TABS
200.0000 mg | ORAL_TABLET | Freq: Every day | ORAL | Status: DC
  Administered 2012-08-10 – 2012-08-12 (×3): 200 mg via ORAL
  Filled 2012-08-10 (×3): qty 1
  Filled 2012-08-10: qty 2
  Filled 2012-08-10: qty 1

## 2012-08-10 MED ORDER — INFLUENZA VIRUS VACC SPLIT PF IM SUSP: 0.5000 mL | INTRAMUSCULAR | Status: AC
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
## 2012-08-10 NOTE — Progress Notes (Signed)
[facility] LCSW Group Therapy      Feelings About Diagnosis  1:15 - 2:30 PM          08/10/2012 3:46 PM  Type of Therapy:  Group Therapy  Participation Level:  Minimal  Participation Quality:  Drowsy and Inattentive  Affect:  Blunted, Depressed and Flat  Cognitive:  Appropriate  Insight:  Limited  Engagement in Therapy:  Limited  Modes of Intervention:  Discussion, Problem-solving, Rapport Building and Support  Summary of Progress/Problems:  Patient advised she had nothing to add to the topic but remained in group.  Wynn Banker 08/10/2012, 3:46 PM

## 2012-08-10 NOTE — ED Notes (Signed)
Pt transported by [facility].

## 2012-08-10 NOTE — ED Provider Notes (Signed)
Patient accepted in transfer at [facility].  Stable for transport.  Geoffery Lyons, MD 08/10/12 Moses Manners

## 2012-08-10 NOTE — Progress Notes (Signed)
Patient has attended groups, interacted with peers, taken her medications prescribed by MD.  SI, contracts for safety, no plan.   Denied HI.   Denied A/V hallucinations.

## 2012-08-10 NOTE — Tx Team (Signed)
Initial Interdisciplinary Treatment Plan  PATIENT STRENGTHS: (choose at least two) Average or above average intelligence Capable of independent living Communication skills General fund of knowledge Supportive family/friends  PATIENT STRESSORS: Loss of daughter to foster care 23yr. ago*   PROBLEM LIST: Problem List/Patient Goals Date to be addressed Date deferred Reason deferred Estimated date of resolution  SI goal decrease negative thoughts remain safe through positive thoughts and actions      Anxiety reduce anxiety through med compliance and talking to a family member when anxious or having SI thoughts                                                  DISCHARGE CRITERIA:  Improved stabilization in mood, thinking, and/or behavior Motivation to continue treatment in a less acute level of care Reduction of life-threatening or endangering symptoms to within safe limits  PRELIMINARY DISCHARGE PLAN: Attend PHP/IOP Outpatient therapy Participate in family therapy Return to previous living arrangement  PATIENT/FAMIILY INVOLVEMENT: This treatment plan has been presented to and reviewed with the patient, LYNNANN KNUDSEN, and/or family member, .  The patient and family have been given the opportunity to ask questions and make suggestions.  Cooper Render 08/10/2012, 4:27 AM

## 2012-08-10 NOTE — Tx Team (Signed)
Initial Interdisciplinary Treatment Plan  PATIENT STRENGTHS: (choose at least two) Active sense of humor Average or above average intelligence Capable of independent living Communication skills General fund of knowledge Supportive family/friends  PATIENT STRESSORS: Medication change or noncompliance Substance abuse   PROBLEM LIST: Problem List/Patient Goals Date to be addressed Date deferred Reason deferred Estimated date of resolution  SI      Substance abuse      Non compliance                                           DISCHARGE CRITERIA:  Ability to meet basic life and health needs Improved stabilization in mood, thinking, and/or behavior Motivation to continue treatment in a less acute level of care Reduction of life-threatening or endangering symptoms to within safe limits Verbal commitment to aftercare and medication compliance  PRELIMINARY DISCHARGE PLAN: Attend PHP/IOP Attend 12-step recovery group Outpatient therapy Participate in family therapy  PATIENT/FAMIILY INVOLVEMENT: This treatment plan has been presented to and reviewed with the patient, KARINDA CABRIALES, and/or family member, .  The patient and family have been given the opportunity to ask questions and make suggestions.  Cooper Render 08/10/2012, 4:23 AM

## 2012-08-10 NOTE — Progress Notes (Signed)
Adult Psychoeducational Group Note  Date:  08/10/2012 Time:  6:37 PM  Group Topic/Focus:  Recovery Goals:   The focus of this group is to identify appropriate goals for recovery and establish a plan to achieve them.  Participation Level:  Minimal  Participation Quality:  Appropriate and Attentive  Affect:  Appropriate  Cognitive:  Appropriate  Insight: Limited  Engagement in Group:  Developing/Improving  Modes of Intervention:  Discussion, Education, Problem-solving and Support  Additional Comments:  Allison Bolton attended group, and participated when asked to share. Patient discussed in group the definition of recovery. She completed workbook on writing two changes that were needed to be made in her life and how she could change them to stay on path for recovery. Patient explained her goals for recovery.   Allison Bolton 08/10/2012, 6:37 PM
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
## 2012-08-10 NOTE — [facility] Suicide Risk Assessment (Signed)
Suicide Risk Assessment  Admission Assessment     Nursing information obtained from:  Patient Demographic factors:  Caucasian;Low socioeconomic status;Living alone;Unemployed Current Mental Status:  Suicide plan (pt. reports thoughts to OD prior to admit contracts for safe) Loss Factors:  Loss of significant relationship (7 yr. old Daughter is in foster care) Historical Factors:  Prior suicide attempts Risk Reduction Factors:  Sense of responsibility to family;Positive social support;Positive therapeutic relationship  CLINICAL FACTORS:   Severe Anxiety and/or Agitation Bipolar Disorder:   Depressive phase  COGNITIVE FEATURES THAT CONTRIBUTE TO RISK:  Closed-mindedness Thought constriction (tunnel vision)    SUICIDE RISK:   Moderate:  Frequent suicidal ideation with limited intensity, and duration, some specificity in terms of plans, no associated intent, good self-control, limited dysphoria/symptomatology, some risk factors present, and identifiable protective factors, including available and accessible social support.  PLAN OF CARE: Supportive approach/coping skills                               Reassess medications  I certify that inpatient services furnished can reasonably be expected to improve the patient's condition.  Allison Bolton A 08/10/2012, 5:09 PM

## 2012-08-11 NOTE — Progress Notes (Signed)
Psychoeducational Group Note  Date:  08/11/2012 Time:  1100  Group Topic/Focus:  Personal Choices and Values:   The focus of this group is to help patients assess and explore the importance of values in their lives, how their values affect their decisions, how they express their values and what opposes their expression.  Participation Level: Did Not Attend  Participation Quality:  Not Applicable  Affect:  Not Applicable  Cognitive:  Not Applicable  Insight:  Not Applicable  Engagement in Group: Not Applicable  Additional Comments:  Allison Bolton did not attend group.  Allison Bolton 08/11/2012, 3:44 PM

## 2012-08-11 NOTE — Progress Notes (Signed)
[facility] LCSW Group Therapy  08/11/2012 3:11 PM  Type of Therapy:  Group Therapy  Participation Level:  Minimal  Participation Quality:  Inattentive  Affect:  Blunted and Depressed  Cognitive:  Appropriate  Insight:  Lacking  Engagement in Therapy:  Lacking  Modes of Intervention:  Discussion, Exploration, Problem-solving, Rapport Building and Support  Summary of Progress/Problems:  Patient attended group but did not participate.  Patient left group before it ended.  Wynn Banker 08/11/2012, 3:11 PM

## 2012-08-11 NOTE — Progress Notes (Signed)
[facility] LCSW Aftercare Discharge Planning Group Note  08/11/2012 1:07 PM  Participation Quality:  Appropriate  Affect:  Appropriate and Depressed  Cognitive:  Appropriate  Insight:  Limited  Engagement in Group:  Limited  Modes of Intervention:  Exploration, Problem-solving, Rapport Building and Support  Summary of Progress/Problems:  Patient reports being better today and denies SI/HI.  She reports attending groups has been helpful.  She rates depression and anxiety at five today.  Patient to be scheduled with Arna Medici for follow up.  Daily workbook provided.  Wynn Banker 08/11/2012, 1:07 PM

## 2012-08-11 NOTE — Progress Notes (Signed)
Patient ID: Allison Bolton, female   DOB: 1971-01-27, 42 y.o.   MRN: [id number] D: Pt. In dayroom playing cards, no distress noted. Pt. Denies depression, but rates anxiety at "about an 8" of 10. Pt. Says groups have been helpful. Pt. Reports support system in family and church. A: Writer introduces self to client and provides therapeutic listening. Writer encourages pt. To continue with use support system. Pt. Will be monitored q80min for safety. Pt. Encouraged to attend group.  R: Pt. Plan to go to [name]  For therapy. Pt. Is safe on the unit. Pt. Attended group.

## 2012-08-11 NOTE — Progress Notes (Signed)
[facility] Group Notes:  (Nursing/MHT/Case Management/Adjunct)  Type of Therapy:  Psychoeducational Skills  Participation Level:  Minimal  Participation Quality:  Attentive Gaurded  Affect:  Blunted and Depressed  Cognitive:  Appropriate and Oriented  Insight:  Improving  Engagement in Group:  Engaged  Modes of Intervention:  Activity, Discussion, Education, Problem-solving, Rapport Building, Socialization and Support  Summary of Progress/Problems: Allison Bolton attended psychoeducational group on labels. 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 Allison Bolton was queit but attentive while group discussed what labels are, how we use them, how they affect the way we think about and perceive the world, and listed positive and negative labels they have used or been called. Allison Bolton was given homework assignment to list 10 words she has been labeled and to find the reality of the situation/label.     Wandra Scot 08/11/2012 1:05 PM

## 2012-08-11 NOTE — Tx Team (Signed)
Interdisciplinary Treatment Plan Update (Adult)  Date:  08/11/2012  Time Reviewed:  10:01 AM   Progress in Treatment: Attending groups:   Yes   Participating in groups:  Yes Taking medication as prescribed:  Yes Tolerating medication:  Yes Family/Significant othe contact made: Contact to be  made with family Patient understands diagnosis:  Yes Discussing patient identified problems/goals with staff: Yes Medical problems stabilized or resolved: Yes Denies suicidal/homicidal ideation:Yes Issues/concerns per patient self-inventory:  Other:   New problem(s) identified:  Reason for Continuation of Hospitalization: Anxiety Depression Medication stabilization  Interventions implemented related to continuation of hospitalization:  Medication Management; safety checks q 15 mins  Additional comments:  Estimated length of stay:  2-3 days  Discharge Plan:  Home with outpatient follow up  New goal(s):  Review of initial/current patient goals per problem list:    1.  Goal(s): Eliminate SI/other thoughts of self harm (Patient will no longer endorse SI/HI or thoughts of self harm)   Met:  Yes  Target date: d/c  As evidenced by: Patient no longer endorses SI/HI or other thoughts of self harm.    2.  Goal (s):Reduce depression/anxiety (Paitent will rate symptoms at four or below)  Met: Yes  Target date: d/c  As evidenced by: Patient rates symptoms at     3.  Goal(s):.stabilize on meds (Patient will report being stabilized on medications   Met:  Yes  Target date: d/c  As evidenced by: Patient continues to endorse symptom    4.  Goal(s): Refer for outpatient follow up (Follow up appointment will be scheduled)   Met:  Yes  Target date: d/c  As evidenced by: Follow up appointment scheduled    Attendees: Patient:   08/11/2012 10:01 AM  Physican:  Patrick North, MD 08/11/2012 10:01 AM  Nursing:  Harold Barban, RN 08/11/2012 10:01 AM   Nursing:  Chinita Greenland, RN  08/11/2012 10:01 AM   Clinical Social Worker:  Juline Patch, LCSW 08/11/2012 10:01 AM   Other: Tera Helper, PHM-NP 08/11/2012 10:01 AM   Other:   08/11/2012 10:01 AM Other:        08/11/2012 10:01 AM

## 2012-08-11 NOTE — Progress Notes (Signed)
[facility] MD Progress Note  08/11/2012 9:19 AM Allison Bolton  MRN:  [id number] Subjective:  Patient denies depression today but lying in bed during group time.  5/10 anxiety, denies physical discomforts, reports "good" sleep and appetite.  Encouraged her to go to group today, stated she will go to the next group.  No medication changes at this time. Diagnosis:   Axis I: Bipolar, Depressed Axis II: Deferred Axis III:  Past Medical History  Diagnosis Date  . Asthma   . Bipolar 1 disorder   . Diverticulosis   . Arthritis   . S/P colonoscopy     Dr. Karilyn Cota 2010: few small diverticula at sigmoid. otherwise normal.   . Dysrhythmia     sts "I have heart palpitations"  . Shortness of breath   . Meniere's disease   . H/O hiatal hernia     4  . Headache   . Fibromyalgia    Axis IV: other psychosocial or environmental problems, problems related to social environment and problems with primary support group Axis V: 41-50 serious symptoms  ADL's:  Intact  Sleep: Good  Appetite:  Good  Suicidal Ideation:  Denies Homicidal Ideation:  Denies  Psychiatric Specialty Exam: Review of Systems  Constitutional: Negative.   HENT: Negative.   Eyes: Negative.   Respiratory: Negative.   Cardiovascular: Negative.   Gastrointestinal: Negative.   Genitourinary: Negative.   Musculoskeletal: Negative.   Skin: Negative.   Neurological: Negative.   Endo/Heme/Allergies: Negative.   Psychiatric/Behavioral: The patient is nervous/anxious.     Blood pressure 139/87, pulse 70, temperature 98 F (36.7 C), temperature source Oral, resp. rate 16, height 5\' 5"  (1.651 m), weight 93.441 kg (206 lb).Body mass index is 34.28 kg/(m^2).  General Appearance: Casual  Eye Contact::  Fair  Speech:  Normal Rate  Volume:  Normal  Mood:  Anxious  Affect:  Congruent  Thought Process:  Coherent  Orientation:  Full (Time, Place, and Person)  Thought Content:  WDL  Suicidal Thoughts:  No  Homicidal Thoughts:  No   Memory:  Immediate;   Fair Recent;   Fair Remote;   Fair  Judgement:  Fair  Insight:  Fair  Psychomotor Activity:  Increased  Concentration:  Fair  Recall:  Fair  Akathisia:  No  Handed:  Right  AIMS (if indicated):     Assets:  Leisure Time Physical Health Resilience  Sleep:  Number of Hours: 5.5    Current Medications: Current Facility-Administered Medications  Medication Dose Route Frequency Provider Last Rate Last Dose  . acetaminophen (TYLENOL) tablet 650 mg  650 mg Oral Q6H PRN Verne Spurr, PA-C      . alum & mag hydroxide-simeth (MAALOX/MYLANTA) 200-200-20 MG/5ML suspension 30 mL  30 mL Oral Q4H PRN Verne Spurr, PA-C      . gabapentin (NEURONTIN) capsule 300 mg  300 mg Oral BID Sanjuana Kava, NP   300 mg at 08/11/12 0808  . hydrOXYzine (ATARAX/VISTARIL) tablet 25 mg  25 mg Oral Q6H PRN Nanine Means, NP   25 mg at 08/11/12 0809  . influenza  inactive virus vaccine (FLUZONE/FLUARIX) injection 0.5 mL  0.5 mL Intramuscular Tomorrow-1000 Rachael Fee, MD      . lamoTRIgine (LAMICTAL) tablet 200 mg  200 mg Oral QHS Sanjuana Kava, NP   200 mg at 08/10/12 2215  . magnesium hydroxide (MILK OF MAGNESIA) suspension 30 mL  30 mL Oral Daily PRN Verne Spurr, PA-C      . pneumococcal 23  valent vaccine (PNU-IMMUNE) injection 0.5 mL  0.5 mL Intramuscular Tomorrow-1000 Rachael Fee, MD      . risperiDONE (RISPERDAL) tablet 2 mg  2 mg Oral QHS Sanjuana Kava, NP   2 mg at 08/10/12 2215  . topiramate (TOPAMAX) tablet 100 mg  100 mg Oral BID Sanjuana Kava, NP   100 mg at 08/11/12 1610  . zolpidem (AMBIEN) tablet 5 mg  5 mg Oral QHS PRN Rachael Fee, MD   5 mg at 08/10/12 2216    Lab Results:  Results for orders placed during the hospital encounter of 08/09/12 (from the past 48 hour(s))  CBC WITH DIFFERENTIAL     Status: Abnormal   Collection Time   08/09/12  6:19 PM      Component Value Range Comment   WBC 14.5 (*) 4.0 - 10.5 K/uL    RBC 4.76  3.87 - 5.11 MIL/uL    Hemoglobin 13.9   12.0 - 15.0 g/dL    HCT 96.0  45.4 - 09.8 %    MCV 85.7  78.0 - 100.0 fL    MCH 29.2  26.0 - 34.0 pg    MCHC 34.1  30.0 - 36.0 g/dL    RDW 11.9  14.7 - 82.9 %    Platelets 285  150 - 400 K/uL    Neutrophils Relative 70  43 - 77 %    Neutro Abs 10.2 (*) 1.7 - 7.7 K/uL    Lymphocytes Relative 21  12 - 46 %    Lymphs Abs 3.0  0.7 - 4.0 K/uL    Monocytes Relative 7  3 - 12 %    Monocytes Absolute 1.0  0.1 - 1.0 K/uL    Eosinophils Relative 1  0 - 5 %    Eosinophils Absolute 0.2  0.0 - 0.7 K/uL    Basophils Relative 0  0 - 1 %    Basophils Absolute 0.0  0.0 - 0.1 K/uL   BASIC METABOLIC PANEL     Status: Abnormal   Collection Time   08/09/12  6:19 PM      Component Value Range Comment   Sodium 138  135 - 145 mEq/L    Potassium 3.3 (*) 3.5 - 5.1 mEq/L    Chloride 102  96 - 112 mEq/L    CO2 25  19 - 32 mEq/L    Glucose, Bld 96  70 - 99 mg/dL    BUN 13  6 - 23 mg/dL    Creatinine, Ser 5.62  0.50 - 1.10 mg/dL    Calcium 9.6  8.4 - 13.0 mg/dL    GFR calc non Af Amer >90  >90 mL/min    GFR calc Af Amer >90  >90 mL/min   ETHANOL     Status: Normal   Collection Time   08/09/12  6:19 PM      Component Value Range Comment   Alcohol, Ethyl (B) <11  0 - 11 mg/dL   URINALYSIS, ROUTINE W REFLEX MICROSCOPIC     Status: Abnormal   Collection Time   08/09/12  6:43 PM      Component Value Range Comment   Color, Urine YELLOW  YELLOW    APPearance CLEAR  CLEAR    Specific Gravity, Urine >1.030 (*) 1.005 - 1.030    pH 6.0  5.0 - 8.0    Glucose, UA NEGATIVE  NEGATIVE mg/dL    Hgb urine dipstick NEGATIVE  NEGATIVE    Bilirubin  Urine NEGATIVE  NEGATIVE    Ketones, ur NEGATIVE  NEGATIVE mg/dL    Protein, ur NEGATIVE  NEGATIVE mg/dL    Urobilinogen, UA 0.2  0.0 - 1.0 mg/dL    Nitrite NEGATIVE  NEGATIVE    Leukocytes, UA NEGATIVE  NEGATIVE MICROSCOPIC NOT DONE ON URINES WITH NEGATIVE PROTEIN, BLOOD, LEUKOCYTES, NITRITE, OR GLUCOSE <1000 mg/dL.  URINE RAPID DRUG SCREEN (HOSP PERFORMED)     Status:  Abnormal   Collection Time   08/09/12  6:43 PM      Component Value Range Comment   Opiates NONE DETECTED  NONE DETECTED    Cocaine NONE DETECTED  NONE DETECTED    Benzodiazepines NONE DETECTED  NONE DETECTED    Amphetamines POSITIVE (*) NONE DETECTED    Tetrahydrocannabinol NONE DETECTED  NONE DETECTED    Barbiturates NONE DETECTED  NONE DETECTED     Physical Findings: AIMS: Facial and Oral Movements Muscles of Facial Expression: None, normal Lips and Perioral Area: None, normal Jaw: None, normal Tongue: None, normal,Extremity Movements Upper (arms, wrists, hands, fingers): None, normal Lower (legs, knees, ankles, toes): None, normal, Trunk Movements Neck, shoulders, hips: None, normal, Overall Severity Severity of abnormal movements (highest score from questions above): None, normal Incapacitation due to abnormal movements: None, normal Patient's awareness of abnormal movements (rate only patient's report): No Awareness, Dental Status Current problems with teeth and/or dentures?: No Does patient usually wear dentures?: No  CIWA:  CIWA-Ar Total: 1  COWS:  COWS Total Score: 1   Treatment Plan Summary: Daily contact with patient to assess and evaluate symptoms and progress in treatment Medication management  Plan:  Review of chart, vital signs, medications, and notes. 1-Individual and group therapy encouraged 2-Medication management for depression and anxiety:  Medications reviewed with the patient and she stated no untoward effects 3-Coping skills for depression and anxiety 4-Continue crisis stabilization and management 5-Address health issues--monitoring blood pressures, slightly elevated from baseline this am--will continue to monitor 6-Treatment plan in progress to prevent relapse of depression and anxiety   Medical Decision Making Problem Points:  Established problem, stable/improving (1) and Review of psycho-social stressors (1) Data Points:  Review of medication  regiment & side effects (2)  I certify that inpatient services furnished can reasonably be expected to improve the patient's condition.   Nanine Means, PMH-NP 08/11/2012, 9:19 AM

## 2012-08-11 NOTE — Progress Notes (Signed)
D:  Allison Bolton reports that she requested medication to help her sleep.  She reports depression at 5/10 and hopelessness at 5/10.  She denies SI/HI/AVH at this time.  She is attending groups and is interacting appropriately with staff and other patients. A;  Safety checks q 15 minutes.  Medications administered as ordered.  Emotional support provided. R:  Safety maintained on unit.

## 2012-08-11 NOTE — Progress Notes (Signed)
Reviewed

## 2012-08-12 DIAGNOSIS — F411 Generalized anxiety disorder: Secondary | ICD-10-CM

## 2012-08-12 DIAGNOSIS — F419 Anxiety disorder, unspecified: Secondary | ICD-10-CM | POA: Diagnosis present

## 2012-08-12 NOTE — Progress Notes (Signed)
Patient ID: Allison Bolton, female   DOB: [birth year], 42 y.o.   MRN: [id number] D: Pt. Noticeable on the unit no distress noted. Pt. Reports "good day" "groups been positive". Pt. Report an earlier group about a lady in trauma and how she coped with the situation was very helpful. A: Writer encouraged pt. To continue to reap coping skill to use in daily living. Staff will monitor q66min for safety. Staff encouraged group. R: Pt. Is safe on the unit. Pt. Notes new skills on how to handle difficult situations.

## 2012-08-12 NOTE — Progress Notes (Signed)
[facility] LCSW Aftercare Discharge Planning Group Note  08/12/2012 4:59 PM  Participation Quality: Did not attend group.    Wynn Banker 08/12/2012, 4:59 PM

## 2012-08-12 NOTE — Progress Notes (Signed)
Adult Psychoeducational Group Note  Date:  08/12/2012 Time:  6:33 PM  Group Topic/Focus:  Rediscovering Joy:   The focus of this group is to explore various ways to relieve stress in a positive manner.  Participation Level:  Active  Participation Quality:  Appropriate and Attentive  Affect:  Appropriate  Cognitive:  Appropriate  Insight: Appropriate  Engagement in Group:  Engaged  Modes of Intervention:  Activity, Discussion and Socialization  Additional Comments:  Allison Bolton attended group and participated. Group discussion consisted of talking about how to rediscover joy. Patient stated ways that laughter and humor affect the human mind and body. Patient then expressed ways to have humor and laughter apart of their lives. Activity was apart of group, patient explained one thing enjoyable related to the five senses and how to incorporate into daily coping skills.    Allison Bolton 08/12/2012, 6:33 PM

## 2012-08-12 NOTE — Progress Notes (Signed)
Reviewed. Patient seen and assessed, reports she would like to attend more groups prior to being discharged.

## 2012-08-12 NOTE — Progress Notes (Signed)
D:  Patient's self inventory sheet, patient sleeps well, has good appetite, normal energy level, good attention span.  Rated depression and hopelessness #1, anxiety #8.  Denied withdrawals.   Denied SI.   Denied physical problems.  Denied pain.  After discharge will attend groups.  Denied HI.   Denied A/V hallucinations.  Does have discharge plans.  No problems taking medications after discharge. A:  Staff will monitor every 15 minutes for safety.   Emotional support and encouragement given patient.  Scheduled medications administered per MD order. R:  Patient denied  SI and HI.  Denied A/V hallucinations.   Denied pain.  Patient remains safe on unit and will continue to monitor for safety.

## 2012-08-12 NOTE — Progress Notes (Signed)
[facility] MD Progress Note  08/12/2012 9:31 AM Allison Bolton  MRN:  [id number] Subjective:  Denies depression, 9/10 anxiety, stated she slept "alright" but still feels tired, in bed at this time despite group time, pleasant Diagnosis:   Axis I: Anxiety Disorder NOS and Bipolar, Depressed Axis II: Deferred Axis III:  Past Medical History  Diagnosis Date  . Asthma   . Bipolar 1 disorder   . Diverticulosis   . Arthritis   . S/P colonoscopy     Dr. Karilyn Cota 2010: few small diverticula at sigmoid. otherwise normal.   . Dysrhythmia     sts "I have heart palpitations"  . Shortness of breath   . Meniere's disease   . H/O hiatal hernia     4  . Headache   . Fibromyalgia    Axis IV: economic problems, other psychosocial or environmental problems, problems related to social environment and problems with primary support group Axis V: 41-50 serious symptoms  ADL's:  Intact  Sleep: Fair  Appetite:  Good  Suicidal Ideation:  Denies  Homicidal Ideation:  Denies  Psychiatric Specialty Exam: Review of Systems  Constitutional: Negative.   HENT: Negative.   Eyes: Negative.   Respiratory: Negative.   Cardiovascular: Negative.   Gastrointestinal: Negative.   Genitourinary: Negative.   Musculoskeletal: Negative.   Skin: Negative.   Neurological: Negative.   Endo/Heme/Allergies: Negative.   Psychiatric/Behavioral: Positive for depression. The patient is nervous/anxious.     Blood pressure 118/82, pulse 90, temperature 98.2 F (36.8 C), temperature source Oral, resp. rate 16, height 5\' 5"  (1.651 m), weight 93.441 kg (206 lb).Body mass index is 34.28 kg/(m^2).  General Appearance: Casual  Eye Contact::  Fair  Speech:  Normal Rate  Volume:  Normal  Mood:  Anxious and Depressed  Affect:  Congruent  Thought Process:  Coherent  Orientation:  Full (Time, Place, and Person)  Thought Content:  WDL  Suicidal Thoughts:  No  Homicidal Thoughts:  No  Memory:  Immediate;   Fair Recent;    Fair Remote;   Fair  Judgement:  Fair  Insight:  Fair  Psychomotor Activity:  Decreased  Concentration:  Fair  Recall:  Fair  Akathisia:  No  Handed:  Right  AIMS (if indicated):     Assets:  Communication Skills Desire for Improvement Social Support  Sleep:  Number of Hours: 5.5    Current Medications: Current Facility-Administered Medications  Medication Dose Route Frequency Provider Last Rate Last Dose  . acetaminophen (TYLENOL) tablet 650 mg  650 mg Oral Q6H PRN Verne Spurr, PA-C      . alum & mag hydroxide-simeth (MAALOX/MYLANTA) 200-200-20 MG/5ML suspension 30 mL  30 mL Oral Q4H PRN Verne Spurr, PA-C      . gabapentin (NEURONTIN) capsule 300 mg  300 mg Oral BID Sanjuana Kava, NP   300 mg at 08/12/12 0824  . hydrOXYzine (ATARAX/VISTARIL) tablet 25 mg  25 mg Oral Q6H PRN Nanine Means, NP   25 mg at 08/12/12 0825  . influenza  inactive virus vaccine (FLUZONE/FLUARIX) injection 0.5 mL  0.5 mL Intramuscular Tomorrow-1000 Rachael Fee, MD      . lamoTRIgine (LAMICTAL) tablet 200 mg  200 mg Oral QHS Sanjuana Kava, NP   200 mg at 08/11/12 2216  . magnesium hydroxide (MILK OF MAGNESIA) suspension 30 mL  30 mL Oral Daily PRN Verne Spurr, PA-C      . risperiDONE (RISPERDAL) tablet 2 mg  2 mg Oral QHS Nelda Marseille  Nwoko, NP   2 mg at 08/11/12 2216  . topiramate (TOPAMAX) tablet 100 mg  100 mg Oral BID Sanjuana Kava, NP   100 mg at 08/12/12 1610  . zolpidem (AMBIEN) tablet 5 mg  5 mg Oral QHS PRN Rachael Fee, MD   5 mg at 08/11/12 2217    Lab Results: No results found for this or any previous visit (from the past 48 hour(s)).  Physical Findings: AIMS: Facial and Oral Movements Muscles of Facial Expression: None, normal Lips and Perioral Area: None, normal Jaw: None, normal Tongue: None, normal,Extremity Movements Upper (arms, wrists, hands, fingers): None, normal Lower (legs, knees, ankles, toes): None, normal, Trunk Movements Neck, shoulders, hips: None, normal, Overall  Severity Severity of abnormal movements (highest score from questions above): None, normal Incapacitation due to abnormal movements: None, normal Patient's awareness of abnormal movements (rate only patient's report): No Awareness, Dental Status Current problems with teeth and/or dentures?: No Does patient usually wear dentures?: No  CIWA:  CIWA-Ar Total: 1  COWS:  COWS Total Score: 1   Treatment Plan Summary: Daily contact with patient to assess and evaluate symptoms and progress in treatment Medication management  LOS:  1-3 days since admission  Plan:  Review of chart, vital signs, medications, and notes. 1-Individual and group therapy 2-Medication management for depression and anxiety:  Medications reviewed with the patient and she stated no untoward effects 3-Coping skills for depression and anxiety 4-Continue crisis stabilization and management 5-Address health issues--vital signs stable 6-Treatment plan in progress to prevent relapse of depression and anxiety  Medical Decision Making Problem Points:  Established problem, stable/improving (1) and Review of psycho-social stressors (1) Data Points:  Review of medication regiment & side effects (2)  I certify that inpatient services furnished can reasonably be expected to improve the patient's condition.   Nanine Means, PMH-NP 08/12/2012, 9:31 AM

## 2012-08-12 NOTE — Progress Notes (Signed)
[facility] LCSW Group Therapy  [facility]  08/12/2012 5:00 PM  Type of Therapy:  Group Therapy  Participation Level:  Patient left group before presentation was completed.  Wynn Banker 08/12/2012, 5:00 PM

## 2012-08-13 DIAGNOSIS — F411 Generalized anxiety disorder: Secondary | ICD-10-CM

## 2012-08-13 MED ORDER — ZOLPIDEM TARTRATE 5 MG PO TABS: 5.0000 mg | ORAL_TABLET | Freq: Every evening | ORAL | Status: DC | PRN

## 2012-08-13 MED ORDER — RISPERIDONE 2 MG PO TABS: 2.0000 mg | ORAL_TABLET | Freq: Every day | ORAL | Status: DC

## 2012-08-13 MED ORDER — GABAPENTIN 300 MG PO CAPS: 300.0000 mg | ORAL_CAPSULE | Freq: Two times a day (BID) | ORAL | Status: DC

## 2012-08-13 MED ORDER — HYDROXYZINE HCL 25 MG PO TABS: 25.0000 mg | ORAL_TABLET | Freq: Four times a day (QID) | ORAL | Status: DC | PRN

## 2012-08-13 MED ORDER — LAMOTRIGINE 200 MG PO TABS: 200.0000 mg | ORAL_TABLET | Freq: Every day | ORAL | Status: DC

## 2012-08-13 NOTE — Progress Notes (Signed)
[facility] LCSW Aftercare Discharge Planning Group Note  08/13/2012 11:22 AM  Participation Quality:  Appropriate  Affect:  Appropriate  Cognitive:  Alert and Appropriate  Insight:  Engaged  Engagement in Group:  Engaged  Modes of Intervention:  Exploration, Problem-solving, Rapport Building and Support  Summary of Progress/Problems:  Patient reports doing well today.  She rated anxiety at ten and stated anxiety up due to discharging home today.   She rates other symptoms at one.  Patient advised she contact ACT Team to transport her home.  Wynn Banker 08/13/2012, 11:22 AM

## 2012-08-13 NOTE — Discharge Summary (Signed)
Reviewed

## 2012-08-13 NOTE — Progress Notes (Signed)
Adult Psychoeducational Group Note  Date:  08/13/2012 Time:  12:24 PM  Group Topic/Focus:  Relapse Prevention Planning:   The focus of this group is to define relapse and discuss the need for planning to combat relapse.  Participation Level:  Minimal  Participation Quality:  Drowsy  Affect:  Blunted and Depressed  Cognitive:  Alert  Insight: lacking  Engagement in Group:  Limited  Modes of Intervention:  Discussion, Education and Support  Additional Comments:  Pt seemed depressed in group but was called out to talk about discharge planning. Pt indicated her goal was to work on her depression.  Salif Tay T 08/13/2012, 12:24 PM

## 2012-08-13 NOTE — H&P (Signed)
Agree with assessment and plan,  Madie Reno A. Dub Mikes, M.D.

## 2012-08-13 NOTE — Progress Notes (Signed)
[facility] Adult Case Management Discharge Plan :  Will you be returning to the same living situation after discharge: Yes,  Patient to return to her home At discharge, do you have transportation home?:Yes,  Patient to be tranported by ACT Team Do you have the ability to pay for your medications:Yes,  Patient able to obtain medications  Release of information consent forms completed and in the chart;  Patient's signature needed at discharge.  Patient to Follow up at: Follow-up Information    Follow up with Fanny Skates, [facility] ACT Team. On 08/13/2012. (You are scheduled to see Fanny Skates today following discharge.)    Contact information:   [address] [place], [place]   [phone]         Patient denies SI/HI:   Yes,  Patient is no longer endorsing SI/HI or thoughts of self harm    Safety Planning and Suicide Prevention discussed:  Yes,  Reviewed during aftercare group.  Wynn Banker 08/13/2012, 11:21 AM

## 2012-08-13 NOTE — Progress Notes (Signed)
[facility] INPATIENT:  Family/Significant Other Suicide Prevention Education  Suicide Prevention Education:  Patient Refusal for Family/Significant Other Suicide Prevention Education: The patient Allison Bolton has refused to provide written consent for family/significant other to be provided Family/Significant Other Suicide Prevention Education during admission and/or prior to discharge.  Physician notified.  Patient stated no family involvement during treatment team.  Patient is monitored closely by [facility] ACT Team.  Juline Patch Hairston 08/13/2012, 11:19 AM

## 2012-08-13 NOTE — Progress Notes (Signed)
D) Pt being discharged to home accompanied by her Act team member. Affect remains flat but mood appropriate. Denies SI and HI. Rates her depression and hopelessness both at a 1.  A) All belongings returned to Pt. All discharge and follow up plans explained to the Pt. Given support, reassurance and praise.  R) Denies SI and HI.

## 2012-08-13 NOTE — Tx Team (Signed)
Interdisciplinary Treatment Plan Update (Adult)  Date:  08/13/2012  Time Reviewed:  9:49 AM   Progress in Treatment: Attending groups:   Yes   Participating in groups:  Yes Taking medication as prescribed:  Yes Tolerating medication:  Yes Family/Significant othe contact made: Patient reports no family involvement Patient understands diagnosis:  Yes Discussing patient identified problems/goals with staff: Yes Medical problems stabilized or resolved: Yes Denies suicidal/homicidal ideation:Yes Issues/concerns per patient self-inventory:  Other:   New problem(s) identified:  Reason for Continuation of Hospitalization:  Interventions implemented related to continuation of hospitalization:  Medication Management; safety checks q 15 mins  Additional comments:  Estimated length of stay:  Discharge home today  Discharge Plan:  Home with outpatient follow up  New goal(s):  Review of initial/current patient goals per problem list:    1.  Goal(s): Eliminate SI/other thoughts of self harm (Patient will no longer endorse SI/HI or thoughts of self harm)   Met:  Yes  Target date: d/c  As evidenced by: Patient no longer endorses SI/HI or other thoughts of self harm.    2.  Goal (s):Reduce depression/anxiety (Paitent will rate symptoms at four or below)  Met: Yes  Target date: d/c  As evidenced by: Patient rates symptoms at one today    3.  Goal(s):.stabilize on meds (Patient will report being stabilized on medications)   Met:  Yes  Target date: d/c  As evidenced by: Patient reports being stabilized on medications     4.  Goal(s): Refer for outpatient follow up (Follow up appointment will be scheduled)   Met:  Yes  Target date: d/c  As evidenced by: Follow up appointment scheduled    Attendees: Patient:  Allison Bolton 08/13/2012 9:49 AM  Physican:  Patrick North, MD 08/13/2012 9:49 AM  Nursing:  Harold Barban, RN 08/13/2012 9:49 AM   Nursing:  Lamount Cranker,  RN 08/13/2012 9:49 AM   Clinical Social Worker:  Juline Patch, LCSW 08/13/2012 9:49 AM   Other: Tera Helper, PHM-NP 08/13/2012 9:49 AM   Other:   08/13/2012 9:49 AM Other:        08/13/2012 9:49 AM

## 2012-08-13 NOTE — [facility] Suicide Risk Assessment (Signed)
Suicide Risk Assessment  Discharge Assessment     Demographic Factors:  Caucasian, female  Mental Status Per Nursing Assessment::   On Admission:  Suicide plan (pt. reports thoughts to OD prior to admit contracts for safe)  Current Mental Status by Physician: Patient alert and oriented to 4. Denies AH/VH/SI/HI.  Loss Factors: NA  Historical Factors: Impulsivity  Risk Reduction Factors:   Positive social support  Continued Clinical Symptoms:  Depression:   Recent sense of peace/wellbeing  Cognitive Features That Contribute To Risk:  Thought constriction (tunnel vision)    Suicide Risk:  Minimal: No identifiable suicidal ideation.  Patients presenting with no risk factors but with morbid ruminations; may be classified as minimal risk based on the severity of the depressive symptoms  Discharge Diagnoses:   AXIS I:  Bipolar I disorder AXIS II:  Deferred AXIS III:   Past Medical History  Diagnosis Date  . Asthma   . Bipolar 1 disorder   . Diverticulosis   . Arthritis   . S/P colonoscopy     Dr. Karilyn Cota 2010: few small diverticula at sigmoid. otherwise normal.   . Dysrhythmia     sts "I have heart palpitations"  . Shortness of breath   . Meniere's disease   . H/O hiatal hernia     4  . Headache   . Fibromyalgia    AXIS IV:  other psychosocial or environmental problems AXIS V:  61-70 mild symptoms  Plan Of Care/Follow-up recommendations:  Activity:  regular Diet:  regular Follow up with ACT team.  Is patient on multiple antipsychotic therapies at discharge:  No   Has Patient had three or more failed trials of antipsychotic monotherapy by history:  No  Recommended Plan for Multiple Antipsychotic Therapies: NA  Kimsey Demaree 08/13/2012, 10:14 AM

## 2012-08-13 NOTE — Discharge Summary (Signed)
Physician Discharge Summary Note  Patient:  Allison Bolton is an 42 y.o., female MRN:  [id number] DOB:  1970-12-04 Patient phone:  [phone] (home)  Patient address:   [address] [place] [address],   Date of Admission:  08/10/2012 Date of Discharge: 08/13/2012  Reason for Admission:  Depression with suicidal thoughts  Discharge Diagnoses: Active Problems:  Anxiety  Review of Systems  Constitutional: Negative.   HENT: Negative.   Eyes: Negative.   Respiratory: Negative.   Cardiovascular: Negative.   Gastrointestinal: Negative.   Genitourinary: Negative.   Musculoskeletal: Negative.   Skin: Negative.   Neurological: Negative.   Endo/Heme/Allergies: Negative.   Psychiatric/Behavioral: The patient is nervous/anxious.    Axis Diagnosis:   AXIS I:  Anxiety Disorder NOS, Bipolar, Depressed and Depressive Disorder NOS AXIS II:  Deferred AXIS III:   Past Medical History  Diagnosis Date  . Asthma   . Bipolar 1 disorder   . Diverticulosis   . Arthritis   . S/P colonoscopy     Dr. Karilyn Cota 2010: few small diverticula at sigmoid. otherwise normal.   . Dysrhythmia     sts "I have heart palpitations"  . Shortness of breath   . Meniere's disease   . H/O hiatal hernia     4  . Headache   . Fibromyalgia    AXIS IV:  economic problems, other psychosocial or environmental problems, problems related to social environment and problems with primary support group AXIS V:  61-70 mild symptoms  Level of Care:  OP  Hospital Course:  Reviewed chart, vital signs, medications, and notes. 1-Admitted for crisis management and stabilization, completed 2-Individual and group therapy attended with participation 3-Medication managed for depression and anxiety to reduce current symptoms to base line and improve the patient's overall level of functioning:  Medications reviewed with the patient and she stated no untoward effects, Ambien discontinued due to her feeling "tired" and not attending  am groups 4-Coping skills for depression and anxiety developed and utilized 5-Addressed health issues--no issues noted, medically stable 6-Treatment plan in place to prevent relapse of depression and anxiety 7-Psychosocial education regarding relapse prevention and self-care 8-Patient denied suicidal/homicidal ideations and auditory/visual hallucinations, follow-up appointments encouraged to attend, outside support groups encouraged and information given, Rx given at discharge, patient denies depression, "some anxiety", ACT team in place at discharge  Consults:  None  Significant Diagnostic Studies:  labs: Completed and reviewed, stable  Discharge Vitals:   Blood pressure 111/80, pulse 118, temperature 98.5 F (36.9 C), temperature source Oral, resp. rate 18, height 5\' 5"  (1.651 m), weight 93.441 kg (206 lb). Body mass index is 34.28 kg/(m^2). Lab Results:   No results found for this or any previous visit (from the past 72 hour(s)).  Physical Findings: AIMS: Facial and Oral Movements Muscles of Facial Expression: None, normal Lips and Perioral Area: None, normal Jaw: None, normal Tongue: None, normal,Extremity Movements Upper (arms, wrists, hands, fingers): None, normal Lower (legs, knees, ankles, toes): None, normal, Trunk Movements Neck, shoulders, hips: None, normal, Overall Severity Severity of abnormal movements (highest score from questions above): None, normal Incapacitation due to abnormal movements: None, normal Patient's awareness of abnormal movements (rate only patient's report): No Awareness, Dental Status Current problems with teeth and/or dentures?: No Does patient usually wear dentures?: No  CIWA:  CIWA-Ar Total: 0  COWS:  COWS Total Score: 0   Psychiatric Specialty Exam: See Psychiatric Specialty Exam and Suicide Risk Assessment completed by Attending Physician prior to discharge.  Discharge destination:  Home  Is patient on multiple antipsychotic therapies at  discharge:  No   Has Patient had three or more failed trials of antipsychotic monotherapy by history:  No Recommended Plan for Multiple Antipsychotic Therapies:  N/A  Discharge Orders    Future Orders Please Complete By Expires   Diet - low sodium heart healthy      Activity as tolerated - No restrictions          Medication List     As of 08/13/2012 10:56 AM    STOP taking these medications         traZODone 150 MG tablet   Commonly known as: DESYREL      TAKE these medications      Indication    gabapentin 300 MG capsule   Commonly known as: NEURONTIN   Take 1 capsule (300 mg total) by mouth 2 (two) times daily.    Indication: Neuropathic Pain      hydrOXYzine 25 MG tablet   Commonly known as: ATARAX/VISTARIL   Take 1 tablet (25 mg total) by mouth every 6 (six) hours as needed for anxiety.       lamoTRIgine 200 MG tablet   Commonly known as: LAMICTAL   Take 1 tablet (200 mg total) by mouth at bedtime.    Indication: Manic-Depression      risperiDONE 2 MG tablet   Commonly known as: RISPERDAL   Take 1 tablet (2 mg total) by mouth at bedtime.    Indication: Manic-Depression      topiramate 100 MG tablet   Commonly known as: TOPAMAX   Take 100 mg by mouth 2 (two) times daily. For appetite control and could help with mood mamangement       zolpidem 5 MG tablet   Commonly known as: AMBIEN   Take 1 tablet (5 mg total) by mouth at bedtime as needed for sleep.    Indication: Trouble Sleeping         Follow-up recommendations:  Activity as tolerated, low-sodium heart healthy diet  Comments:  Patient will continue with her care through her ACT team in the community.  Total Discharge Time:  Greater than 30 minutes  Signed: Nanine Means, PMH-NP 08/13/2012, 10:56 AM

## 2012-08-18 NOTE — Progress Notes (Signed)
Patient Discharge Instructions:  After Visit Summary (AVS):   Faxed to:  08/18/12 Discharge Summary Note:   Faxed to:  08/18/12 Psychiatric Admission Assessment Note:   Faxed to:  08/18/12 Suicide Risk Assessment - Discharge Assessment:   Faxed to:  08/18/12 Faxed/Sent to the Next Level Care provider:  08/18/12 Faxed to [facility] @ [phone]  Jerelene Redden, 08/18/2012, 3:11 PM

## 2012-08-25 ENCOUNTER — Inpatient Hospital Stay (HOSPITAL_COMMUNITY)
Admission: AD | Admit: 2012-08-25 | Discharge: 2012-08-30 | DRG: 885 | Disposition: A | Discharge status: Home / Self Care | Payer: MEDICAID | Source: Intra-hospital | Attending: Psychiatry | Admitting: Psychiatry

## 2012-08-25 ENCOUNTER — Emergency Department (HOSPITAL_COMMUNITY)
Admission: EM | Admit: 2012-08-25 | Discharge: 2012-08-25 | Disposition: A | Discharge status: Other Acute Inpatient Hospital | Payer: MEDICAID | Source: Home / Self Care | Attending: Emergency Medicine | Admitting: Emergency Medicine

## 2012-08-25 ENCOUNTER — Encounter (HOSPITAL_COMMUNITY): Payer: Self-pay | Admitting: *Deleted

## 2012-08-25 ENCOUNTER — Encounter (HOSPITAL_COMMUNITY): Payer: Self-pay

## 2012-08-25 DIAGNOSIS — Z9889 Other specified postprocedural states: Secondary | ICD-10-CM | POA: Insufficient documentation

## 2012-08-25 DIAGNOSIS — F411 Generalized anxiety disorder: Secondary | ICD-10-CM | POA: Diagnosis present

## 2012-08-25 DIAGNOSIS — Z8709 Personal history of other diseases of the respiratory system: Secondary | ICD-10-CM | POA: Insufficient documentation

## 2012-08-25 DIAGNOSIS — IMO0001 Reserved for inherently not codable concepts without codable children: Secondary | ICD-10-CM | POA: Insufficient documentation

## 2012-08-25 DIAGNOSIS — Z79899 Other long term (current) drug therapy: Secondary | ICD-10-CM

## 2012-08-25 DIAGNOSIS — R45851 Suicidal ideations: Secondary | ICD-10-CM

## 2012-08-25 DIAGNOSIS — Z8719 Personal history of other diseases of the digestive system: Secondary | ICD-10-CM | POA: Insufficient documentation

## 2012-08-25 DIAGNOSIS — F319 Bipolar disorder, unspecified: Secondary | ICD-10-CM | POA: Insufficient documentation

## 2012-08-25 DIAGNOSIS — F17200 Nicotine dependence, unspecified, uncomplicated: Secondary | ICD-10-CM

## 2012-08-25 DIAGNOSIS — F332 Major depressive disorder, recurrent severe without psychotic features: Principal | ICD-10-CM | POA: Diagnosis present

## 2012-08-25 DIAGNOSIS — Z8679 Personal history of other diseases of the circulatory system: Secondary | ICD-10-CM | POA: Insufficient documentation

## 2012-08-25 DIAGNOSIS — J45909 Unspecified asthma, uncomplicated: Secondary | ICD-10-CM | POA: Insufficient documentation

## 2012-08-25 DIAGNOSIS — Z8669 Personal history of other diseases of the nervous system and sense organs: Secondary | ICD-10-CM | POA: Insufficient documentation

## 2012-08-25 DIAGNOSIS — M129 Arthropathy, unspecified: Secondary | ICD-10-CM | POA: Insufficient documentation

## 2012-08-25 DIAGNOSIS — K769 Liver disease, unspecified: Secondary | ICD-10-CM

## 2012-08-25 DIAGNOSIS — F419 Anxiety disorder, unspecified: Secondary | ICD-10-CM

## 2012-08-25 LAB — BASIC METABOLIC PANEL
BUN: 11 mg/dL (ref 6–23)
CO2: 22 mEq/L (ref 19–32)
Chloride: 105 mEq/L (ref 96–112)
GFR calc non Af Amer: >90 mL/min (ref >90)
Glucose, Bld: 96 mg/dL (ref 70–99)
Potassium: 3.6 mEq/L (ref 3.5–5.1)
Sodium: 138 mEq/L (ref 135–145)

## 2012-08-25 LAB — CBC WITH DIFFERENTIAL/PLATELET
Eosinophils Absolute: 0.2 10*3/uL (ref 0.0–0.7)
Hemoglobin: 13.5 g/dL (ref 12.0–15.0)
Lymphocytes Relative: 32 % (ref 12–46)
Lymphs Abs: 3.6 10*3/uL (ref 0.7–4.0)
Monocytes Relative: 7 % (ref 3–12)
Neutro Abs: 6.5 10*3/uL (ref 1.7–7.7)
Neutrophils Relative %: 59 % (ref 43–77)
Platelets: 305 10*3/uL (ref 150–400)
RBC: 4.68 MIL/uL (ref 3.87–5.11)
WBC: 11.1 10*3/uL — ABNORMAL HIGH (ref 4.0–10.5)

## 2012-08-25 MED ORDER — LAMOTRIGINE 200 MG PO TABS
200.0000 mg | ORAL_TABLET | Freq: Every day | ORAL | Status: DC
  Administered 2012-08-25 – 2012-08-29 (×5): 200 mg via ORAL
  Filled 2012-08-25 (×2): qty 1
  Filled 2012-08-25: qty 2
  Filled 2012-08-25 (×4): qty 1

## 2012-08-25 MED ORDER — RISPERIDONE 2 MG PO TABS
2.0000 mg | ORAL_TABLET | Freq: Every day | ORAL | Status: DC
  Administered 2012-08-25 – 2012-08-29 (×5): 2 mg via ORAL
  Filled 2012-08-25 (×7): qty 1

## 2012-08-25 MED ORDER — ACETAMINOPHEN 325 MG PO TABS
650.0000 mg | ORAL_TABLET | Freq: Four times a day (QID) | ORAL | Status: DC | PRN
Start: 2012-08-25 — End: 2012-08-30

## 2012-08-25 MED ORDER — TRAZODONE HCL 50 MG PO TABS
50.0000 mg | ORAL_TABLET | Freq: Every evening | ORAL | Status: DC | PRN
  Administered 2012-08-25: 50 mg via ORAL
  Filled 2012-08-25 (×4): qty 1

## 2012-08-25 MED ORDER — ALUM & MAG HYDROXIDE-SIMETH 200-200-20 MG/5ML PO SUSP: 30.0000 mL | ORAL | Status: DC | PRN

## 2012-08-25 MED ORDER — NICOTINE 14 MG/24HR TD PT24
14.0000 mg | MEDICATED_PATCH | Freq: Every day | TRANSDERMAL | Status: DC
  Filled 2012-08-25 (×6): qty 1

## 2012-08-25 MED ORDER — GABAPENTIN 300 MG PO CAPS
300.0000 mg | ORAL_CAPSULE | Freq: Two times a day (BID) | ORAL | Status: DC
  Administered 2012-08-25 – 2012-08-30 (×10): 300 mg via ORAL
  Filled 2012-08-25 (×13): qty 1

## 2012-08-25 MED ORDER — HYDROXYZINE HCL 25 MG PO TABS
25.0000 mg | ORAL_TABLET | Freq: Four times a day (QID) | ORAL | Status: DC | PRN
  Administered 2012-08-28 – 2012-08-30 (×4): 25 mg via ORAL

## 2012-08-25 MED ORDER — TOPIRAMATE 100 MG PO TABS: 100.0000 mg | ORAL_TABLET | Freq: Two times a day (BID) | ORAL | Status: DC

## 2012-08-25 MED ORDER — MAGNESIUM HYDROXIDE 400 MG/5ML PO SUSP: 30.0000 mL | Freq: Every day | ORAL | Status: DC | PRN

## 2012-08-25 NOTE — Progress Notes (Signed)
SPOKE WITH AC ERIC KAPLAN AT [facility].  PT WAS ACCEPTED BY SPENCER SIMON,PA TO DR RAVI ROOM 501-1

## 2012-08-25 NOTE — ED Notes (Signed)
Patient wanted to know when ACT team would be in patient was told ACT would be in as soon as possible. RN made aware.

## 2012-08-25 NOTE — Progress Notes (Signed)
Patient ID: Allison Bolton, female   DOB: 08-27-1970, 42 y.o.   MRN: [id number]  Pt was pleasant and cooperative during the adm, but anxious. Pt had to be redirected several times during the assessment. Pt continued to focus on her meds and the doctor assigned to her care.  Stated she was last here Feb 2014. Informed the writer that she's been med compliant and doing her follow up with [facility]. Stated she's under stress due to the pending adoption of her daughter in March.

## 2012-08-25 NOTE — ED Notes (Signed)
Sitter has patient within sight.

## 2012-08-25 NOTE — ED Notes (Signed)
Patient would like to know when ACT team will be here. RN made aware.

## 2012-08-25 NOTE — ED Notes (Signed)
Pt states she is suicidal and she has a plan. Pt states she was sent here from [facility].  Pt states she was going to overdose on flexeril

## 2012-08-25 NOTE — ED Notes (Signed)
Pt accepted at [facility] by Dr Daleen Bo. Room 501-1

## 2012-08-25 NOTE — ED Notes (Signed)
telephsyc set up in the room awaiting MD to call.

## 2012-08-25 NOTE — Progress Notes (Signed)
SPOKE WITH AC ERIC KAPLAN AT [facility].  PT WAS ACCEPTED BY SPENCER SIMON TO DR RAVI ROOM 501-1. GAVE INFORMATION TO DR IVA KNAPP AND PT'S NURSE. PT WILL BE TRANSPORTED BY [facility].

## 2012-08-25 NOTE — ED Notes (Signed)
Patient is resting comfortably. 
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
## 2012-08-25 NOTE — Tx Team (Signed)
Initial Interdisciplinary Treatment Plan  PATIENT STRENGTHS: (choose at least two) Ability for insight Average or above average intelligence Capable of independent living Communication skills General fund of knowledge Motivation for treatment/growth Physical Health Religious Affiliation Special hobby/interest Supportive family/friends  PATIENT STRESSORS: Legal issue Loss of daughter to foster care* Marital or family conflict   PROBLEM LIST: Problem List/Patient Goals Date to be addressed Date deferred Reason deferred Estimated date of resolution  "To get some therapy for my mental status" 08/25/12           Depression 08/25/12     Suicidal Ideation 08/25/12                                    DISCHARGE CRITERIA:  Ability to meet basic life and health needs Adequate post-discharge living arrangements Improved stabilization in mood, thinking, and/or behavior Medical problems require only outpatient monitoring Motivation to continue treatment in a less acute level of care Need for constant or close observation no longer present Reduction of life-threatening or endangering symptoms to within safe limits Safe-care adequate arrangements made Verbal commitment to aftercare and medication compliance  PRELIMINARY DISCHARGE PLAN: Outpatient therapy Return to previous living arrangement  PATIENT/FAMIILY INVOLVEMENT: This treatment plan has been presented to and reviewed with the patient, Allison Bolton, and/or family member.  The patient and family have been given the opportunity to ask questions and make suggestions.  Fransico [facility] 08/25/2012, 10:42 PM

## 2012-08-25 NOTE — ED Notes (Signed)
telephsyc being conducted at this time.

## 2012-08-25 NOTE — ED Notes (Signed)
Pt. wanded at triage by security. 

## 2012-08-25 NOTE — ED Provider Notes (Signed)
History     This chart was scribed for Ward Givens, MD, MD by Burman Nieves ED Scribe. The patient was seen in room APA15/[facility]15 and the patient's care was started at 3:56 PM.  CSN: [id number]  Arrival date & time 08/25/12  1415    Chief Complaint  Patient presents with  . V70.1    (Consider location/radiation/quality/duration/timing/severity/associated sxs/prior treatment) The history is provided by the patient. No language interpreter was used.   Allison Bolton is a 42 y.o. female who presents to the Emergency Department complaining of suicidal thoughts today. Pt reports that she was sad she was not able to see her daughter. She states she was going to over dose on flexeril today. She states she has 30 pills of flexeril. Pt when into [facility] once suicidal thoughts began. Pt denies HI and hallucinations  She spent four days at [facility] and was discharged August 14, 2012. She states she is still taking all medication prescribed to her during her admittance to hospital. States she felt better when she was discharged from [facility].Pt reports she was taught coping mechanisms but denies using them. Denies using illegal street drugs. Pt denies contacting immediate family members. Pt smokes. Pt denies fever, chills, cough, nausea, vomiting, diarrhea, SOB, weakness, and any other associated symptoms.  Patient had her daughter taken away from her by social services and she has been upset recently because her daughter is going to be adopted.  Patient states several times "I liked behavioral health" patient questioning constantly along its when a bee before she can go to behavioral health  PCP Dr Olena Leatherwood   Past Medical History  Diagnosis Date  . Asthma   . Bipolar 1 disorder   . Diverticulosis   . Arthritis   . S/P colonoscopy     Dr. Karilyn Cota 2010: few small diverticula at sigmoid. otherwise normal.   . Dysrhythmia     sts "I have heart palpitations"  . Shortness of breath   . Meniere's  disease   . H/O hiatal hernia     4  . Headache   . Fibromyalgia     Past Surgical History  Procedure Laterality Date  . Tonsillectomy and adenoidectomy    . Umbilical hernia repair  Dec 2011    Dr. Lovell Sheehan  . Abdominal hysterectomy    . Hemorrhoid surgery    . Cholecystectomy    . Foot surgery    . Left ovarian removal    . Cesarean section    . Right ovarian removal    . Exploratory laparoscopy    . Wisdom teeth removal    . Multiple hernia repairs    . Tubes in ears    . Tonsillectomy    . Laparoscopy  02/19/2011    Procedure: LAPAROSCOPY DIAGNOSTIC;  Surgeon: Dalia Heading;  Location: [facility];  Service: General;  Laterality: N/A;  . Laparoscopic appendectomy  02/19/2011    Procedure: APPENDECTOMY LAPAROSCOPIC;  Surgeon: Dalia Heading;  Location: [facility];  Service: General;  Laterality: N/A;  . Appendectomy      Family History  Problem Relation Age of Onset  . Thyroid disease Mother   . Colon cancer      paternal and maternal grandfather  . Anesthesia problems Neg Hx   . Hypotension Neg Hx   . Malignant hyperthermia Neg Hx   . Pseudochol deficiency Neg Hx   . Meniere's disease Other   . Colon cancer Father   . Colon cancer  Brother     History  Substance Use Topics  . Smoking status: Current Every Day Smoker -- 0.50 packs/day for 20 years    Types: Cigarettes    Start date: 07/07/1981  . Smokeless tobacco: Never Used  . Alcohol Use: No     Comment: not since June 2012  Lives at home Lives alone  [place] History   Grav Para Term Preterm Abortions TAB SAB Ect Mult Living   5 2 2  3  2   2       Review of Systems  Constitutional: Negative for fever and chills.  Psychiatric/Behavioral: Positive for suicidal ideas. Negative for hallucinations and self-injury.  All other systems reviewed and are negative.    Allergies  Amoxicillin  Home Medications   Current Outpatient Rx  Name  Route  Sig  Dispense  Refill  . gabapentin (NEURONTIN) 300 MG capsule    Oral   Take 1 capsule (300 mg total) by mouth 2 (two) times daily.   60 capsule   0   . hydrOXYzine (ATARAX/VISTARIL) 25 MG tablet   Oral   Take 1 tablet (25 mg total) by mouth every 6 (six) hours as needed for anxiety.   30 tablet   0   . lamoTRIgine (LAMICTAL) 200 MG tablet   Oral   Take 1 tablet (200 mg total) by mouth at bedtime.   30 tablet   0   . risperiDONE (RISPERDAL) 2 MG tablet   Oral   Take 1 tablet (2 mg total) by mouth at bedtime.   30 tablet   0   . topiramate (TOPAMAX) 100 MG tablet   Oral   Take 100 mg by mouth 2 (two) times daily. For appetite control and could help with mood mamangement         . zolpidem (AMBIEN) 5 MG tablet   Oral   Take 5 mg by mouth at bedtime as needed for sleep.           BP 117/74  Pulse 85  Temp(Src) 97.6 F (36.4 C) (Oral)  Resp 20  Ht 5\' 6"  (1.676 m)  Wt 200 lb (90.719 kg)  BMI 32.3 kg/m2  SpO2 96%  Vital signs normal    Physical Exam  Nursing note and vitals reviewed. Constitutional: She is oriented to person, place, and time. She appears well-developed and well-nourished.  Non-toxic appearance. She does not appear ill. No distress.  I actually saw patient when she was in the ED waiting for her last admission earlier this month. She was very flat and agitated during that ED visit. Patient does ED visit has good eye contact, she seems very upbeat, she does not appear to be as flat and depressed as she was with her prior visit.  HENT:  Head: Normocephalic and atraumatic.  Right Ear: External ear normal.  Left Ear: External ear normal.  Nose: Nose normal. No mucosal edema or rhinorrhea.  Mouth/Throat: Oropharynx is clear and moist and mucous membranes are normal. No dental abscesses or edematous.  Eyes: Conjunctivae and EOM are normal. Pupils are equal, round, and reactive to light.  Neck: Normal range of motion and full passive range of motion without pain. Neck supple.  Cardiovascular: Normal rate, regular  rhythm and normal heart sounds.  Exam reveals no gallop and no friction rub.   No murmur heard. Pulmonary/Chest: Effort normal and breath sounds normal. No respiratory distress. She has no wheezes. She has no rhonchi. She has no rales. She  exhibits no tenderness and no crepitus.  Abdominal: Soft. Normal appearance and bowel sounds are normal. She exhibits no distension. There is no tenderness. There is no rebound and no guarding.  Musculoskeletal: Normal range of motion. She exhibits no edema and no tenderness.  Moves all extremities well.   Neurological: She is alert and oriented to person, place, and time. She has normal strength. No cranial nerve deficit.  Skin: Skin is warm, dry and intact. No rash noted. No erythema. No pallor.  Psychiatric: She has a normal mood and affect. Her speech is normal and behavior is normal. Her mood appears not anxious.    ED Course  Procedures (including critical care time)  DIAGNOSTIC STUDIES: Oxygen Saturation is 96% on room air, normal by my interpretation.    COORDINATION OF CARE:   4:03 PM Discussed ED treatment with pt and pt agrees.   Review of prior behavioral health admission shows patient stated she was going to overdose on Flexeril at that time also.  Telepsych consult ordered  17:40 Samson Frederic, ACT called will come see patient.   Tele-psych consult done by Dr Jacky Kindle who recommends inpatient psychiatric admission.  19:50 Samson Frederic, ACT states accepted at [facility] by Dr Meryl Crutch  Results for orders placed during the hospital encounter of 08/25/12  CBC WITH DIFFERENTIAL      Result Value Range   WBC 11.1 (*) 4.0 - 10.5 K/uL   RBC 4.68  3.87 - 5.11 MIL/uL   Hemoglobin 13.5  12.0 - 15.0 g/dL   HCT 16.1  09.6 - 04.5 %   MCV 84.2  78.0 - 100.0 fL   MCH 28.8  26.0 - 34.0 pg   MCHC 34.3  30.0 - 36.0 g/dL   RDW 40.9  81.1 - 91.4 %   Platelets 305  150 - 400 K/uL   Neutrophils Relative 59  43 - 77 %   Neutro Abs 6.5  1.7 - 7.7 K/uL   Lymphocytes  Relative 32  12 - 46 %   Lymphs Abs 3.6  0.7 - 4.0 K/uL   Monocytes Relative 7  3 - 12 %   Monocytes Absolute 0.7  0.1 - 1.0 K/uL   Eosinophils Relative 1  0 - 5 %   Eosinophils Absolute 0.2  0.0 - 0.7 K/uL   Basophils Relative 0  0 - 1 %   Basophils Absolute 0.0  0.0 - 0.1 K/uL  BASIC METABOLIC PANEL      Result Value Range   Sodium 138  135 - 145 mEq/L   Potassium 3.6  3.5 - 5.1 mEq/L   Chloride 105  96 - 112 mEq/L   CO2 22  19 - 32 mEq/L   Glucose, Bld 96  70 - 99 mg/dL   BUN 11  6 - 23 mg/dL   Creatinine, Ser 7.82  0.50 - 1.10 mg/dL   Calcium 9.6  8.4 - 95.6 mg/dL   GFR calc non Af Amer >90  >90 mL/min   GFR calc Af Amer >90  >90 mL/min  ETHANOL      Result Value Range   Alcohol, Ethyl (B) <11  0 - 11 mg/dL   Laboratory interpretation all normal except mild leukocytosis    1. Bipolar disorder   2. Suicidal ideation    Discharge to [facility]   Devoria Albe, MD, FACEP    MDM    I personally performed the services described in this documentation, which was scribed in my presence. The recorded information has  been reviewed and considered.  Devoria Albe, MD, Armando Gang    Ward Givens, MD 08/25/12 [phone]

## 2012-08-25 NOTE — ED Notes (Signed)
Dietary called for meal per pt request.

## 2012-08-25 NOTE — ED Notes (Signed)
Resting in bed with nad noted. Sitter at [facility]. Toilet offered.

## 2012-08-26 MED ORDER — TRAZODONE HCL 50 MG PO TABS
150.0000 mg | ORAL_TABLET | Freq: Every evening | ORAL | Status: DC | PRN
  Administered 2012-08-26 (×2): 150 mg via ORAL
  Filled 2012-08-26 (×4): qty 3

## 2012-08-26 NOTE — Progress Notes (Signed)
D: Pt continues sad flat & depressed. 72 hour request for d/c signed.Pt  Attended karioki . Takes medications as prescribed.Pt reports that her sleep is poor & energy level is low.A: Supported & encouraged. Continues on 15 minute checks.R:Pt safety maintained.

## 2012-08-26 NOTE — Progress Notes (Signed)
D: Patient appropriate and cooperative with staff and peers. Patient's affect/mood is anxious. She reported on the self inventory sheet that her sleep is poor, appetite is good, energy level is low and ability to pay attention is good. Patient rated depression and feelings of hopelessness "1".  A: Support and encouragement provided to patient. Administered scheduled medication per ordering MD. Monitor Q15 minute checks for safety.  R: Patient receptive. Denies SI/HI/AVH. Patient remains safe.

## 2012-08-26 NOTE — Progress Notes (Signed)
Patient ID: Allison Bolton, female   DOB: August 28, 1970, 42 y.o.   MRN: [id number]  D: Patient with dull, flat affect endorsing depression. A: Q 15 minute safety checks, administer medications as ordered by MD. R: Pt denies SI.

## 2012-08-26 NOTE — Progress Notes (Signed)
[facility] LCSW Group Therapy  [facility]  08/26/2012 3:46 PM  Type of Therapy:  Group Therapy  Participation Level:  Minimal  Participation Quality:  Appropriate and Attentive  Affect:  Anxious and Depressed  Cognitive:  Alert and Appropriate  Insight:  Developing/Improving  Engagement in Therapy:  Developing/Improving  Modes of Intervention:  Discussion, Education, Problem-solving, Rapport Building and Support  Summary of Progress/Problems:  Patient listened attentively to speaker from [employer] [facility] She made no comments on the presentation.  Wynn Banker 08/26/2012, 3:46 PM

## 2012-08-26 NOTE — Progress Notes (Signed)
Patient ID: Allison Bolton, female   DOB: 1970/09/06, 42 y.o.   MRN: [id number] D: No new data from previous assessment. Patient in bed sleeping. Respiration regular and unlabored. No sign of distress noted at this time A: Medication administered for sleep.15 mins checks for  Safety. R: Pt is safe.
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
## 2012-08-26 NOTE — Progress Notes (Signed)
Adult Psychoeducational Group Note  Date:  08/26/2012 Time:  2000 Group Topic/Focus:  Karaoke  Participation Level:  Minimal  Participation Quality:  Appropriate and Attentive  Affect:  Blunted and Flat  Cognitive:  Alert and Oriented  Insight: Good  Engagement in Group:  Supportive  Modes of Intervention:  Socialization  Additional Comments:   Humberto Seals [facility] 08/26/2012, 9:37 PM

## 2012-08-26 NOTE — Progress Notes (Signed)
Adult Psychosocial Assessment Update Interdisciplinary Team  Previous [facility] admissions/discharges:  Admissions Discharges  Date: 08/10/12 Date:  08/13/12  Date: Date:  Date: Date:  Date: Date:  Date: Date:   Changes since the last Psychosocial Assessment (including adherence to outpatient mental health and/or substance abuse treatment, situational issues contributing to decompensation and/or relapse). Patient reports becoming increasingly depressed and suicide due to not be able to make visit with daughter who may be placed for adoptions             Discharge Plan 1. Will you be returning to the same living situation after discharge?   Yes: No:      If no, what is your plan?    Patient reports plans to return to her home at discharge.  She advised her ACT Team will provide transportation for her to get home or she can contact her uncle who is supportive.       2. Would you like a referral for services when you are discharged? Yes:     If yes, for what services?  No:       Patient is followed by the ACT Team at [facility] in [place].       Summary and Recommendations (to be completed by the evaluator) Allison Bolton is a 42 year old Caucasian female admitted with Bipolar Disorder.  She will Patient will benefit from crisis stabilization, evaluation for medication management, psycho education groups for coping skills development, group therapy and assistance with discharge planning.                      Signature:  Wynn Banker, 08/26/2012 12:02 PM
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
## 2012-08-26 NOTE — Progress Notes (Signed)
[facility] LCSW Aftercare Discharge Planning Group Note  08/26/2012 10:49 AM  Participation Quality:  Appropriate  Affect:  Appropriate and Depressed  Cognitive:  Appropriate  Insight:  Developing/Improving  Engagement in Group:  Developing/Improving  Modes of Intervention:  Education, Exploration, Problem-solving, Rapport Building and Support  Summary of Progress/Problems:  Patient advised of admitting to the hospital with SI due to not being able to see her child who is in DSS custody.  She is currently denying SI and rates depression at one and anxiety at eight.  She denies any ETOH/drug use.  She reports being better due to being around others and being able to talk about problems.  Patient is followed outpatient by [facility] ACT Team in [place]. She has home, transportation and access to medications.    Wynn Banker 08/26/2012, 10:49 AM

## 2012-08-27 MED ORDER — ZOLPIDEM TARTRATE 5 MG PO TABS
5.0000 mg | ORAL_TABLET | Freq: Every evening | ORAL | Status: DC | PRN
  Administered 2012-08-27 – 2012-08-29 (×3): 5 mg via ORAL
  Filled 2012-08-27 (×3): qty 1

## 2012-08-27 NOTE — Progress Notes (Signed)
[facility] LCSW Group Therapy Relapse Prevention 1:15 -2:30   08/27/2012 2:50 PM  Type of Therapy:  Group Therapy  Participation Level:  Minimal  Participation Quality:  Appropriate and Attentive  Affect:  Anxious and Depressed  Cognitive:  Alert and Appropriate  Insight:  Developing/Improving  Engagement in Therapy:  Developing/Improving  Modes of Intervention:  Discussion, Education, Problem-solving, Rapport Building and Support  Summary of Progress/Problems:  Patient shared relapse for her means not doing things she need to do to take care of herself and using her coping skills.  She shared there are people she needs to spend less time with due to their negativity.  Wynn Banker 08/27/2012, 2:50 PM

## 2012-08-27 NOTE — Progress Notes (Signed)
[facility] MD Progress Note  08/27/2012 10:11 AM Allison Bolton  MRN:  [id number] Subjective:  Patient reports she is feeling better. Looking forward to seeing her daughter on Monday. Having difficulty sleeping.  Diagnosis:   Axis I: Bipolar, Depressed Axis II: Deferred Axis III:  Past Medical History  Diagnosis Date  . Asthma   . Bipolar 1 disorder   . Diverticulosis   . Arthritis   . S/P colonoscopy     Dr. Karilyn Cota 2010: few small diverticula at sigmoid. otherwise normal.   . Dysrhythmia     sts "I have heart palpitations"  . Shortness of breath   . Meniere's disease   . H/O hiatal hernia     4  . Headache   . Fibromyalgia    Axis IV: other psychosocial or environmental problems Axis V: 51-60 moderate symptoms  ADL's:  Intact  Sleep: Poor  Appetite:  Fair  Psychiatric Specialty Exam: Review of Systems  Constitutional: Negative.   HENT: Negative.   Eyes: Negative.   Respiratory: Negative.   Cardiovascular: Negative.   Gastrointestinal: Negative.   Genitourinary: Negative.   Musculoskeletal: Negative.   Skin: Negative.   Neurological: Negative.   Endo/Heme/Allergies: Negative.   Psychiatric/Behavioral: Positive for depression. The patient is nervous/anxious and has insomnia.     Blood pressure 102/71, pulse 103, temperature 98.1 F (36.7 C), temperature source Oral, resp. rate 16, height 5\' 5"  (1.651 m), weight 93.441 kg (206 lb).Body mass index is 34.28 kg/(m^2).  General Appearance: Disheveled  Eye Solicitor::  Fair  Speech:  Pressured  Volume:  Decreased  Mood:  Depressed and Dysphoric  Affect:  Constricted and Depressed  Thought Process:  Circumstantial  Orientation:  Full (Time, Place, and Person)  Thought Content:  Rumination  Suicidal Thoughts:  No  Homicidal Thoughts:  No  Memory:  Immediate;   Fair Recent;   Fair Remote;   Fair  Judgement:  Fair  Insight:  Present  Psychomotor Activity:  Normal  Concentration:  Fair  Recall:  Fair  Akathisia:   No  Handed:  Right  AIMS (if indicated):     Assets:  Communication Skills Desire for Improvement Social Support  Sleep:  Number of Hours: 5.25   Current Medications: Current Facility-Administered Medications  Medication Dose Route Frequency Provider Last Rate Last Dose  . acetaminophen (TYLENOL) tablet 650 mg  650 mg Oral Q6H PRN Kerry Hough, PA      . alum & mag hydroxide-simeth (MAALOX/MYLANTA) 200-200-20 MG/5ML suspension 30 mL  30 mL Oral Q4H PRN Kerry Hough, PA      . gabapentin (NEURONTIN) capsule 300 mg  300 mg Oral BID Kerry Hough, PA   300 mg at 08/27/12 4098  . hydrOXYzine (ATARAX/VISTARIL) tablet 25 mg  25 mg Oral Q6H PRN Kerry Hough, PA      . lamoTRIgine (LAMICTAL) tablet 200 mg  200 mg Oral QHS Kerry Hough, PA   200 mg at 08/26/12 2130  . magnesium hydroxide (MILK OF MAGNESIA) suspension 30 mL  30 mL Oral Daily PRN Kerry Hough, PA      . nicotine (NICODERM CQ - dosed in mg/24 hours) patch 14 mg  14 mg Transdermal Q0600 Kerry Hough, PA      . risperiDONE (RISPERDAL) tablet 2 mg  2 mg Oral QHS Kerry Hough, PA   2 mg at 08/26/12 2131  . traZODone (DESYREL) tablet 150 mg  150 mg Oral QHS,MR X 1 Ahmod Gillespie  Daleen Bo, MD   150 mg at 08/26/12 2234    Lab Results:  Results for orders placed during the hospital encounter of 08/25/12 (from the past 48 hour(s))  CBC WITH DIFFERENTIAL     Status: Abnormal   Collection Time    08/25/12  2:36 PM      Result Value Range   WBC 11.1 (*) 4.0 - 10.5 K/uL   RBC 4.68  3.87 - 5.11 MIL/uL   Hemoglobin 13.5  12.0 - 15.0 g/dL   HCT 14.7  82.9 - 56.2 %   MCV 84.2  78.0 - 100.0 fL   MCH 28.8  26.0 - 34.0 pg   MCHC 34.3  30.0 - 36.0 g/dL   RDW 13.0  86.5 - 78.4 %   Platelets 305  150 - 400 K/uL   Neutrophils Relative 59  43 - 77 %   Neutro Abs 6.5  1.7 - 7.7 K/uL   Lymphocytes Relative 32  12 - 46 %   Lymphs Abs 3.6  0.7 - 4.0 K/uL   Monocytes Relative 7  3 - 12 %   Monocytes Absolute 0.7  0.1 - 1.0 K/uL    Eosinophils Relative 1  0 - 5 %   Eosinophils Absolute 0.2  0.0 - 0.7 K/uL   Basophils Relative 0  0 - 1 %   Basophils Absolute 0.0  0.0 - 0.1 K/uL  BASIC METABOLIC PANEL     Status: None   Collection Time    08/25/12  2:36 PM      Result Value Range   Sodium 138  135 - 145 mEq/L   Potassium 3.6  3.5 - 5.1 mEq/L   Chloride 105  96 - 112 mEq/L   CO2 22  19 - 32 mEq/L   Glucose, Bld 96  70 - 99 mg/dL   BUN 11  6 - 23 mg/dL   Creatinine, Ser 6.96  0.50 - 1.10 mg/dL   Calcium 9.6  8.4 - 29.5 mg/dL   GFR calc non Af Amer >90  >90 mL/min   GFR calc Af Amer >90  >90 mL/min   Comment:            The eGFR has been calculated     using the CKD EPI equation.     This calculation has not been     validated in all clinical     situations.     eGFR's persistently     <90 mL/min signify     possible Chronic Kidney Disease.  ETHANOL     Status: None   Collection Time    08/25/12  2:36 PM      Result Value Range   Alcohol, Ethyl (B) <11  0 - 11 mg/dL   Comment:            LOWEST DETECTABLE LIMIT FOR     SERUM ALCOHOL IS 11 mg/dL     FOR MEDICAL PURPOSES ONLY    Physical Findings: AIMS: Facial and Oral Movements Muscles of Facial Expression: None, normal Lips and Perioral Area: None, normal Jaw: None, normal Tongue: None, normal,Extremity Movements Upper (arms, wrists, hands, fingers): None, normal Lower (legs, knees, ankles, toes): None, normal, Trunk Movements Neck, shoulders, hips: None, normal, Overall Severity Severity of abnormal movements (highest score from questions above): None, normal Incapacitation due to abnormal movements: None, normal Patient's awareness of abnormal movements (rate only patient's report): No Awareness, Dental Status Current problems with teeth and/or dentures?: No Does  patient usually wear dentures?: No  CIWA:    COWS:     Treatment Plan Summary: Daily contact with patient to assess and evaluate symptoms and progress in treatment Medication  management  Plan: Continue current plan of care. Discontinue trazodone, start ambien at 10mg . Plan for discharge Monday morning.  Medical Decision Making Problem Points:  Established problem, stable/improving (1), Review of last therapy session (1) and Review of psycho-social stressors (1) Data Points:  Review of medication regiment & side effects (2)  I certify that inpatient services furnished can reasonably be expected to improve the patient's condition.   Brock Mokry 08/27/2012, 10:11 AM

## 2012-08-27 NOTE — Progress Notes (Signed)
[facility] Group Notes:  (Nursing/MHT/Case Management/Adjunct)  Date:  08/27/2012  Time:  2000  Type of Therapy:  Psychoeducational Skills  Participation Level:  Minimal  Participation Quality:  Inattentive  Affect:  Blunted  Cognitive:  Appropriate  Insight:  Lacking  Engagement in Group:  Lacking  Modes of Intervention:  Education  Summary of Progress/Problems: The patient verbalized that she had a good day. She stated that she attended her groups. She would not elaborate any further about her day. Her goal for tomorrow is to get more rest.   Derrill Kay, Kei Langhorst S 08/27/2012, 9:39 PM

## 2012-08-27 NOTE — Tx Team (Signed)
Interdisciplinary Treatment Plan Update   Date Reviewed:  08/27/2012  Time Reviewed:  10:35 AM  Progress in Treatment:   Attending groups: Yes Participating in groups: Yes Taking medication as prescribed: Yes  Tolerating medication: Yes Family/Significant other contact made: Yes  Patient understands diagnosis: Yes  Discussing patient identified problems/goals with staff: Yes Medical problems stabilized or resolved: Yes Denies suicidal/homicidal ideation: Yes Patient has not harmed self or others: Yes  For review of initial/current patient goals, please see plan of care.  Estimated Length of Stay:  2 -3 days  Reasons for Continued Hospitalization:  Anxiety Depression Medication stabilization  New Problems/Goals identified:    Discharge Plan or Barriers:   Home with outpatient follow up at [facility]  Additional Comments:  Patient is no longer endorsing SI.    Attendees:  Patient: Allison Bolton 08/27/2012 10:35 AM   Signature: Patrick North, MD 08/27/2012 10:35 AM  Signature: 08/27/2012 10:35 AM  Signature: Harold Barban, RN 08/27/2012 10:35 AM  Signature 08/27/2012 10:35 AM  Signature:   08/27/2012 10:35 AM  Signature:  Juline Patch, LCSW 08/27/2012 10:35 AM  Signature: Silverio Decamp, PMH-NP 08/27/2012 10:35 AM  Signature: Liliane Bade, BSW 08/27/2012 10:35 AM  Signature:    Signature:    Signature:    Signature:      Scribe for Treatment Team:   Juline Patch,  08/27/2012 10:35 AM

## 2012-08-27 NOTE — Progress Notes (Signed)
[facility] LCSW Aftercare Discharge Planning Group Note  08/27/2012 10:55 AM  Participation Quality:  Appropriate  Affect:  Appropriate and Depressed  Cognitive:  Appropriate  Insight:  Developing/Improving  Engagement in Group:  Developing/Improving  Modes of Intervention:  Education, Exploration, Problem-solving, Rapport Building and Support  Summary of Progress/Problems:  Patient denies SI/HI.  She rates all symptoms at one.  Patient stated she wants to discharge early Monday due to being scheduled to visit with her daughter.  Patient stated ACT Team will transport her home.  Wynn Banker 08/27/2012, 10:55 AM

## 2012-08-27 NOTE — Progress Notes (Signed)
Recreation Therapy Notes    Date: 02.21.2014        Time: 3:00pm      Group Topic/Focus: Self Expression  Participation Level: Active  Participation Quality: Appropriate  Affect: Flat  Cognitive: Appropriate   Additional Comments: Patient was given a worksheet with a blank face on it. Patient was given approximately 15 minutes to identify the colors most present within her. Identify a color to match that emotion and shade the face to represent the emotions most present within her at this time. Music was played for patient and peers.Patietn drew a happy face on her blank face. Patient colored the face happy to represent happiness.    Allison Bolton, LRT/CTRS   Jearl Klinefelter 08/27/2012 4:41 PM

## 2012-08-27 NOTE — Progress Notes (Signed)
D: Patient appropriate and cooperative with staff and peers. Patient's affect/mood is anxious and depressed. She reported on the self inventory sheet that her appetite is good, energy level is high and ability to pay attention is good. Patient rated depression and feelings of hopelessness "1". She reported that changes she plans to make to better care for self is to attend groups at [facility].  A: Support and encouragement provided to patient. Scheduled medications administered per MD orders. Maintain Q15 minute checks for safety.  R: Patient receptive. Denies SI/HI/AVH. Patient remains safe on the unit.

## 2012-08-28 DIAGNOSIS — F3130 Bipolar disorder, current episode depressed, mild or moderate severity, unspecified: Secondary | ICD-10-CM

## 2012-08-28 NOTE — Progress Notes (Signed)
Patient ID: Allison Bolton, female   DOB: May 17, 1971, 42 y.o.   MRN: [id number] D:  Pt presented with depressed mood and flat affect. Pt is observed playing cards with peers. Write asked pt what she has learned during her stay. Pt stated she has learned coping skills to help her once discharged.  Pt denies SI/HI/AVH and pain. Pt attended evening wrap up group and Interacted appropriately with peers. Cooperative with assessment. No acute distressed noted at this time.   A: Met with pt 1:1. Medications administered as prescribed. Writer encouraged pt to discuss feelings. Pt encouraged to come to staff with any question or concerns.  R: Patient remains safe. She is complaint with medications and group programming. Continue current POC.

## 2012-08-28 NOTE — Progress Notes (Addendum)
Patient ID: Allison Bolton, female   DOB: 1970/11/04, 42 y.o.   MRN: [id number] [facility] MD Progress Note  08/28/2012 12:24 PM MAYRE BURY  MRN:  [id number] Subjective:   Feels well. Anticipates discharge Monday . Feels that she is developing better coping skills with group. Understands that she is in a recovery process. Lives alone is open to Assisted Living or group home to help her practice her coping skills. Mood is stable. Denies SI/HI/AH. Diagnosis:   Axis I: Bipolar, Depressed Axis II: Deferred Axis III:  Past Medical History  Diagnosis Date  . Asthma   . Bipolar 1 disorder   . Diverticulosis   . Arthritis   . S/P colonoscopy     Dr. Karilyn Cota 2010: few small diverticula at sigmoid. otherwise normal.   . Dysrhythmia     sts "I have heart palpitations"  . Shortness of breath   . Meniere's disease   . H/O hiatal hernia     4  . Headache   . Fibromyalgia    Axis IV: other psychosocial or environmental problems Axis V: 51-60 moderate symptoms  ADL's:  Intact  Sleep: Poor  Appetite:  Fair  Psychiatric Specialty Exam: Review of Systems  Constitutional: Negative.   HENT: Negative.   Eyes: Negative.   Respiratory: Negative.   Cardiovascular: Negative.   Gastrointestinal: Negative.   Genitourinary: Negative.   Musculoskeletal: Negative.   Skin: Negative.   Neurological: Negative.   Endo/Heme/Allergies: Negative.   Psychiatric/Behavioral: Positive for depression. The patient is nervous/anxious and has insomnia.     Blood pressure 115/85, pulse 90, temperature 96.5 F (35.8 C), temperature source Oral, resp. rate 18, height 5\' 5"  (1.651 m), weight 93.441 kg (206 lb).Body mass index is 34.28 kg/(m^2).  General Appearance: Disheveled  Eye Solicitor::  Fair  Speech:  Pressured  Volume:  Decreased  Mood:  Depressed and Dysphoric  Affect:  Constricted and Depressed  Thought Process:  Circumstantial  Orientation:  Full (Time, Place, and Person)  Thought Content:   Rumination  Suicidal Thoughts:  No  Homicidal Thoughts:  No  Memory:  Immediate;   Fair Recent;   Fair Remote;   Fair  Judgement:  Fair  Insight:  Present  Psychomotor Activity:  Normal  Concentration:  Fair  Recall:  Fair  Akathisia:  No  Handed:  Right  AIMS (if indicated):     Assets:  Communication Skills Desire for Improvement Social Support  Sleep:  Number of Hours: 5.75   Current Medications: Current Facility-Administered Medications  Medication Dose Route Frequency Provider Last Rate Last Dose  . acetaminophen (TYLENOL) tablet 650 mg  650 mg Oral Q6H PRN Kerry Hough, PA      . alum & mag hydroxide-simeth (MAALOX/MYLANTA) 200-200-20 MG/5ML suspension 30 mL  30 mL Oral Q4H PRN Kerry Hough, PA      . gabapentin (NEURONTIN) capsule 300 mg  300 mg Oral BID Kerry Hough, PA   300 mg at 08/28/12 0830  . hydrOXYzine (ATARAX/VISTARIL) tablet 25 mg  25 mg Oral Q6H PRN Kerry Hough, PA      . lamoTRIgine (LAMICTAL) tablet 200 mg  200 mg Oral QHS Kerry Hough, PA   200 mg at 08/27/12 2203  . magnesium hydroxide (MILK OF MAGNESIA) suspension 30 mL  30 mL Oral Daily PRN Kerry Hough, PA      . nicotine (NICODERM CQ - dosed in mg/24 hours) patch 14 mg  14 mg Transdermal Q0600 Karleen Hampshire  E Simon, PA      . risperiDONE (RISPERDAL) tablet 2 mg  2 mg Oral QHS Kerry Hough, PA   2 mg at 08/27/12 2203  . zolpidem (AMBIEN) tablet 5 mg  5 mg Oral QHS PRN Himabindu Ravi, MD   5 mg at 08/27/12 2204    Lab Results:  No results found for this or any previous visit (from the past 48 hour(s)).  Physical Findings: AIMS: Facial and Oral Movements Muscles of Facial Expression: None, normal Lips and Perioral Area: None, normal Jaw: None, normal Tongue: None, normal,Extremity Movements Upper (arms, wrists, hands, fingers): None, normal Lower (legs, knees, ankles, toes): None, normal, Trunk Movements Neck, shoulders, hips: None, normal, Overall Severity Severity of abnormal  movements (highest score from questions above): None, normal Incapacitation due to abnormal movements: None, normal Patient's awareness of abnormal movements (rate only patient's report): No Awareness, Dental Status Current problems with teeth and/or dentures?: No Does patient usually wear dentures?: No  CIWA:    COWS:     Treatment Plan Summary: Daily contact with patient to assess and evaluate symptoms and progress in treatment Medication management  Plan: Continue current plan of care. Discontinue trazodone, start ambien at 10mg . Plan for discharge Monday morning.  Medical Decision Making Problem Points:  Established problem, stable/improving (1), Review of last therapy session (1) and Review of psycho-social stressors (1) Data Points:  Review of medication regiment & side effects (2)  I certify that inpatient services furnished can reasonably be expected to improve the patient's condition.   Devlyn Retter,MICKIE D.RPA-C CAQ-Psych  08/28/2012, 12:24 PM

## 2012-08-28 NOTE — Progress Notes (Signed)
[facility] Group Notes:  (Nursing/MHT/Case Management/Adjunct)  Date:  08/28/2012  Time:  2000  Type of Therapy:  Psychoeducational Skills  Participation Level:  Minimal  Participation Quality:  Inattentive  Affect:  Blunted  Cognitive:  Lacking  Insight:  Limited  Engagement in Group:  Lacking  Modes of Intervention:  Education  Summary of Progress/Problems: The patient stated that she attended groups today. She had nothing else to share about her day. Her goal for tomorrow is to get discharged.   Hazle Coca S 08/28/2012, 11:12 PM

## 2012-08-28 NOTE — Progress Notes (Signed)
Group Topic/Focus:  Identifying Needs:   The focus of this group is to help patients identify their personal needs that have been historically problematic and identify healthy behaviors to address their needs.  Participation Level:  Active  Participation Quality:  Appropriate  Affect:  Appropriate  Cognitive:  Appropriate  Insight:  Engaged  Engagement in Group:  Engaged  Additional Comments:    Garland Smouse A 

## 2012-08-28 NOTE — Clinical Social Work Note (Signed)
[facility] Group Notes:  (Clinical Social Work)  08/28/2012   3:00-4:00PM  Summary of Progress/Problems:   The main focus of today's process group was for the patient to identify something in their life that led to their hospitalization that they would like to change, then to discuss their motivation to change.  The Stages of Change were explained to the group, then each patient identified where they are in that process and what actions they are taking toward change within that stage.  The patient expressed that she wants to change how she gets angry so quickly and easily.  She stated her motivation to change is 5 out of 10, and that she is in the preparation stage of change, in the process of identifying steps to take.  Type of Therapy:  Process Group  Participation Level:  Active  Participation Quality:  Attentive and Sharing  Affect:  Blunted and Depressed  Cognitive:  Oriented  Insight:  Developing/Improving  Engagement in Therapy:  Developing/Improving  Modes of Intervention:  Clarification, Support and Processing, Exploration, Discussion   Ambrose Mantle, LCSW 08/28/2012, 4:24 PM

## 2012-08-28 NOTE — Progress Notes (Signed)
Allison Bolton is seen OOB UAL: on the 500 hall today...toelrated fair. She is flat, sad and remains depressed. SHE interacts with the other patients and staff appropriately, she attends her groups. She is emotionally stuck and has limited insight into what parts she plays in her illness.   A SHe is medicated per MD order.   R Safety is in place and POC cont. \

## 2012-08-29 NOTE — Progress Notes (Signed)
Patient ID: Allison Bolton, female   DOB: 06/10/1971, 42 y.o.   MRN: [id number] [facility] MD Progress Note  08/29/2012 1:09 PM Allison Bolton  MRN:  [id number] Subjective:   Reminds me that she has signed a 72 hour request for discharge. Hipes to be the first discharged in the am as she has weekly visitation with her daughter at 10am.  Mood and affect bright cheerful. Had washed her hair active in [place].  Diagnosis:   Axis I: Bipolar, Depressed Axis II: Deferred Axis III:  Past Medical History  Diagnosis Date  . Asthma   . Bipolar 1 disorder   . Diverticulosis   . Arthritis   . S/P colonoscopy     Dr. Karilyn Cota 2010: few small diverticula at sigmoid. otherwise normal.   . Dysrhythmia     sts "I have heart palpitations"  . Shortness of breath   . Meniere's disease   . H/O hiatal hernia     4  . Headache   . Fibromyalgia    Axis IV: other psychosocial or environmental problems Axis V: 51-60 moderate symptoms  ADL's:  Intact  Sleep: Poor  Appetite:  Fair  Psychiatric Specialty Exam: Review of Systems  Constitutional: Negative.   HENT: Negative.   Eyes: Negative.   Respiratory: Negative.   Cardiovascular: Negative.   Gastrointestinal: Negative.   Genitourinary: Negative.   Musculoskeletal: Negative.   Skin: Negative.   Neurological: Negative.   Endo/Heme/Allergies: Negative.   Psychiatric/Behavioral: Positive for depression. The patient is nervous/anxious and has insomnia.     Blood pressure 137/84, pulse 96, temperature 97.6 F (36.4 C), temperature source Oral, resp. rate 18, height 5\' 5"  (1.651 m), weight 93.441 kg (206 lb).Body mass index is 34.28 kg/(m^2).  General Appearance: Disheveled  Eye Contact:  Fair  Speech:  Pressured  Volume:  Decreased  Mood:  Depressed and Dysphoric  Affect:  Constricted and Depressed  Thought Process:  Circumstantial  Orientation:  Full (Time, Place, and Person)  Thought Content:  Rumination  Suicidal Thoughts:  No   Homicidal Thoughts:  No  Memory:  Immediate;   Fair Recent;   Fair Remote;   Fair  Judgement:  Fair  Insight:  Present  Psychomotor Activity:  Normal  Concentration:  Fair  Recall:  Fair  Akathisia:  No  Handed:  Right  AIMS (if indicated):     Assets:  Communication Skills Desire for Improvement Social Support  Sleep:  Number of Hours: 6.75   Current Medications: Current Facility-Administered Medications  Medication Dose Route Frequency Provider Last Rate Last Dose  . acetaminophen (TYLENOL) tablet 650 mg  650 mg Oral Q6H PRN Kerry Hough, PA      . alum & mag hydroxide-simeth (MAALOX/MYLANTA) 200-200-20 MG/5ML suspension 30 mL  30 mL Oral Q4H PRN Kerry Hough, PA      . gabapentin (NEURONTIN) capsule 300 mg  300 mg Oral BID Kerry Hough, PA   300 mg at 08/29/12 8469  . hydrOXYzine (ATARAX/VISTARIL) tablet 25 mg  25 mg Oral Q6H PRN Kerry Hough, PA   25 mg at 08/29/12 6295  . lamoTRIgine (LAMICTAL) tablet 200 mg  200 mg Oral QHS Kerry Hough, PA   200 mg at 08/28/12 2101  . magnesium hydroxide (MILK OF MAGNESIA) suspension 30 mL  30 mL Oral Daily PRN Kerry Hough, PA      . nicotine (NICODERM CQ - dosed in mg/24 hours) patch 14 mg  14 mg Transdermal  A5409 Kerry Hough, PA      . risperiDONE (RISPERDAL) tablet 2 mg  2 mg Oral QHS Kerry Hough, PA   2 mg at 08/28/12 2101  . zolpidem (AMBIEN) tablet 5 mg  5 mg Oral QHS PRN Himabindu Ravi, MD   5 mg at 08/28/12 2101    Lab Results:  No results found for this or any previous visit (from the past 48 hour(s)).  Physical Findings: AIMS: Facial and Oral Movements Muscles of Facial Expression: None, normal Lips and Perioral Area: None, normal Jaw: None, normal Tongue: None, normal,Extremity Movements Upper (arms, wrists, hands, fingers): None, normal Lower (legs, knees, ankles, toes): None, normal, Trunk Movements Neck, shoulders, hips: None, normal, Overall Severity Severity of abnormal movements (highest  score from questions above): None, normal Incapacitation due to abnormal movements: None, normal Patient's awareness of abnormal movements (rate only patient's report): No Awareness, Dental Status Current problems with teeth and/or dentures?: No Does patient usually wear dentures?: No  CIWA:    COWS:     Treatment Plan Summary: Daily contact with patient to assess and evaluate symptoms and progress in treatment Medication management  Plan: Continue current plan of care. Discontinue trazodone, start ambien at 10mg . Plan for discharge Monday morning.  Medical Decision Making Problem Points:  Established problem, stable/improving (1), Review of last therapy session (1) and Review of psycho-social stressors (1) Data Points:  Review of medication regiment & side effects (2)  I certify that inpatient services furnished can reasonably be expected to improve the patient's condition.   Vincente Asbridge,MICKIE D.RPA-C CAQ-Psych  08/29/2012, 1:09 PM

## 2012-08-29 NOTE — Progress Notes (Signed)
Writer spoke with patient and she reports feeling anxious and reports missing her daughter. Patient inquired as to when she could receive her hs medications and was informed of the earliest she could receive hs medications. Patient received a prn of visteril for anxiety, will monitor effectiveness of medication. Patient currently denies si/hi/a/v hallucinations. Support and encouragement offered, safety maintained on unit with 15 min checks, will continue to monitor.

## 2012-08-29 NOTE — Progress Notes (Signed)
[facility] Group Notes:  (Nursing/MHT/Case Management/Adjunct)  Date:  08/29/2012  Time:  2000  Type of Therapy:  Psychoeducational Skills  Participation Level:  Minimal  Participation Quality:  Resistant  Affect:  Flat  Cognitive:  Lacking  Insight:  Lacking  Engagement in Group:  Lacking  Modes of Intervention:  Education  Summary of Progress/Problems: The patient stated that she had a good day since she attended groups. She would not share anything further and would only respond with one word answers. Her goal for tomorrow is to get discharged.   Hazle Coca S 08/29/2012, 11:26 PM

## 2012-08-29 NOTE — Clinical Social Work Note (Signed)
[facility] Group Notes: (Clinical Social Work)   08/29/2012      Type of Therapy:  Group Therapy   Participation Level:  Did Not Attend - Came into group in the last 15 minutes, did not speak   Ambrose Mantle, LCSW 08/29/2012, 5:08 PM   .

## 2012-08-29 NOTE — Progress Notes (Signed)
Allison Bolton is status qup. She has minimal insight and engagement into her recover. She is flat, blunted and just wants her meds when she wants them....   A SHe contracts for safety and interacts with  Fellow patients on the unit.   R SAFETY IS IN PLACE AND SHE IS EXPECTING HER dc TOMORROW AT 1000 am, DUE TO PREARRANGED VISITATION WITH THIS DAUGHTER.

## 2012-08-30 DIAGNOSIS — F332 Major depressive disorder, recurrent severe without psychotic features: Principal | ICD-10-CM

## 2012-08-30 DIAGNOSIS — F411 Generalized anxiety disorder: Secondary | ICD-10-CM

## 2012-08-30 MED ORDER — LAMOTRIGINE 200 MG PO TABS: 200.0000 mg | ORAL_TABLET | Freq: Every day | ORAL | Status: DC

## 2012-08-30 MED ORDER — TOPIRAMATE 100 MG PO TABS: 100.0000 mg | ORAL_TABLET | Freq: Two times a day (BID) | ORAL | Status: DC

## 2012-08-30 MED ORDER — RISPERIDONE 2 MG PO TABS: 2.0000 mg | ORAL_TABLET | Freq: Every day | ORAL | Status: DC

## 2012-08-30 MED ORDER — HYDROXYZINE HCL 25 MG PO TABS: 25.0000 mg | ORAL_TABLET | Freq: Four times a day (QID) | ORAL | Status: DC | PRN

## 2012-08-30 MED ORDER — ZOLPIDEM TARTRATE 5 MG PO TABS: 5.0000 mg | ORAL_TABLET | Freq: Every evening | ORAL | Status: DC | PRN

## 2012-08-30 MED ORDER — GABAPENTIN 300 MG PO CAPS
300.0000 mg | ORAL_CAPSULE | Freq: Two times a day (BID) | ORAL | Status: DC
Start: 2012-08-30 — End: 2013-05-12

## 2012-08-30 NOTE — Progress Notes (Signed)
Pt is ready for discharge. Very anxious as wants to go to [place] to have visitation with her 43 year old daughter. Pt denies and SI or Hi and contracts for safety. Pt has been pacing the floors anxious to leave. Refused her nicotine patch,. Stated she will not ever kill herself- pt states she would cal someone. Pt states her daughter has been in foster care times one year and is uncertain when she will get her back.

## 2012-08-30 NOTE — [facility] Suicide Risk Assessment (Signed)
Suicide Risk Assessment  Discharge Assessment     Demographic Factors:  Low socioeconomic status and female  Mental Status Per Nursing Assessment::   On Admission:  Suicidal ideation indicated by patient  Current Mental Status by Physician: patient denies suicidal ideation, intent or plan  Loss Factors: Decrease in vocational status and Financial problems/change in socioeconomic status  Historical Factors: NA  Risk Reduction Factors:   Positive social support and Positive therapeutic relationship  Continued Clinical Symptoms:  Resolving mood symptoms  Cognitive Features That Contribute To Risk:  Closed-mindedness    Suicide Risk:  Minimal: No identifiable suicidal ideation.  Patients presenting with no risk factors but with morbid ruminations; may be classified as minimal risk based on the severity of the depressive symptoms  Discharge Diagnoses:   AXIS I:  Bipolar 1 disorder most recent episode depressed  AXIS II:  Deferred AXIS III:   Past Medical History  Diagnosis Date  . Asthma   . Diverticulosis   . Arthritis   . S/P colonoscopy     Dr. Karilyn Cota 2010: few small diverticula at sigmoid. otherwise normal.   . Dysrhythmia     sts "I have heart palpitations"  . Shortness of breath   . Meniere's disease   . H/O hiatal hernia     4  . Headache   . Fibromyalgia    AXIS IV:  other psychosocial or environmental problems and problems related to social environment AXIS V:  61-70 mild symptoms  Plan Of Care/Follow-up recommendations:  Activity:  as tolerated Diet:  healthy Tests:  routine blood test Other:  patient denies suicidal ideation, intent and plan  Is patient on multiple antipsychotic therapies at discharge:  No   Has Patient had three or more failed trials of antipsychotic monotherapy by history:  No  Recommended Plan for Multiple Antipsychotic Therapies: N/A  Thedore Mins, MD 08/30/2012, 8:38 AM

## 2012-08-30 NOTE — Discharge Summary (Signed)
Physician Discharge Summary Note  Patient:  Allison Bolton is an 42 y.o., female MRN:  [id number] DOB:  [birth year] Patient phone:  [phone] (home)  Patient address:   [address] [place] [address],   Date of Admission:  08/25/2012 Date of Discharge: 08/30/2012  Reason for Admission:  Depression with suicidal thoughts (see hospital course below)  Discharge Diagnoses: Active Problems:   * No active hospital problems. *  Review of Systems  Constitutional: Negative.   HENT: Negative.   Eyes: Negative.   Respiratory: Negative.   Cardiovascular: Negative.   Gastrointestinal: Negative.   Genitourinary: Negative.   Musculoskeletal: Negative.   Skin: Negative.   Neurological: Negative.   Endo/Heme/Allergies: Negative.   Psychiatric/Behavioral: Positive for depression. The patient is nervous/anxious.    Axis Diagnosis:   AXIS I:  Generalized Anxiety Disorder and Major Depression, Recurrent severe AXIS II:  Deferred AXIS III:   Past Medical History  Diagnosis Date  . Asthma   . Bipolar 1 disorder   . Diverticulosis   . Arthritis   . S/P colonoscopy     Dr. Karilyn Cota 2010: few small diverticula at sigmoid. otherwise normal.   . Dysrhythmia     sts "I have heart palpitations"  . Shortness of breath   . Meniere's disease   . H/O hiatal hernia     4  . Headache   . Fibromyalgia    AXIS IV:  other psychosocial or environmental problems, problems related to social environment and problems with primary support group AXIS V:  61-70 mild symptoms  Level of Care:  OP  Hospital Course:   Patient is a 42 yo WM who was brought in by [facility] for suicidal thoughts with plan to overdose on her medications. She reports that not seeing her daughter this past week was very upsetting to her and caused her to become depressed and have suicidal thoughts. States she has been compliant with all her medications.   Patient attended group in the beginning with participation but not as much over  the week-end.  She stated she had developed better coping skills while attending groups.  Ligaya's medications were re-instated except for her Topamax, discontinued.  She was given Rx at discharge.  Mayuri will discharge to her ACT team and go to see her daughter who is in foster care at this time.  She hopes to get custody despite wanting to go live in a group home or an independent living facility--not much insight into this issue.  Patient denied suicidal/homicidal ideations and auditory/visual hallucinations, follow-up appointments encouraged to attend at [facility].  Consults:  None  Significant Diagnostic Studies:  labs: Completed and reviewed,stable  Discharge Vitals:   Blood pressure 117/81, pulse 74, temperature 98 F (36.7 C), temperature source Oral, resp. rate 18, height 5\' 5"  (1.651 m), weight 93.441 kg (206 lb). Body mass index is 34.28 kg/(m^2). Lab Results:   No results found for this or any previous visit (from the past 72 hour(s)).  Physical Findings: AIMS: Facial and Oral Movements Muscles of Facial Expression: None, normal Lips and Perioral Area: None, normal Jaw: None, normal Tongue: None, normal,Extremity Movements Upper (arms, wrists, hands, fingers): None, normal Lower (legs, knees, ankles, toes): None, normal, Trunk Movements Neck, shoulders, hips: None, normal, Overall Severity Severity of abnormal movements (highest score from questions above): None, normal Incapacitation due to abnormal movements: None, normal Patient's awareness of abnormal movements (rate only patient's report): No Awareness, Dental Status Current problems with teeth and/or dentures?: No  Does patient usually wear dentures?: No  CIWA:    COWS:     Psychiatric Specialty Exam: See Psychiatric Specialty Exam and Suicide Risk Assessment completed by Attending Physician prior to discharge.  Discharge destination:  Home  Is patient on multiple antipsychotic therapies at discharge:  No   Has  Patient had three or more failed trials of antipsychotic monotherapy by history:  No Recommended Plan for Multiple Antipsychotic Therapies:  N/A  Discharge Orders   Future Orders Complete By Expires     Activity as tolerated - No restrictions  As directed     Diet - low sodium heart healthy  As directed         Medication List    STOP taking these medications       topiramate 100 MG tablet  Commonly known as:  TOPAMAX      TAKE these medications     Indication   gabapentin 300 MG capsule  Commonly known as:  NEURONTIN  Take 1 capsule (300 mg total) by mouth 2 (two) times daily.   Indication:  Neuropathic Pain     hydrOXYzine 25 MG tablet  Commonly known as:  ATARAX/VISTARIL  Take 1 tablet (25 mg total) by mouth every 6 (six) hours as needed for anxiety.      lamoTRIgine 200 MG tablet  Commonly known as:  LAMICTAL  Take 1 tablet (200 mg total) by mouth at bedtime.   Indication:  Manic-Depression     risperiDONE 2 MG tablet  Commonly known as:  RISPERDAL  Take 1 tablet (2 mg total) by mouth at bedtime.   Indication:  Manic-Depression     zolpidem 5 MG tablet  Commonly known as:  AMBIEN  Take 1 tablet (5 mg total) by mouth at bedtime as needed for sleep.   Indication:  Trouble Sleeping           Follow-up Information   Follow up with [facility] On 08/30/2012. (You will be seen by [facility] Team Monday, August 30, 2012 at 8 AM.  Referral # [id number])    Contact information:   [address] [place], [place]   [address]      Follow-up recommendations:  Activity:  As tolerated Diet:  Low-sodium heart healthy diet  Comments:  Patient will discharge to her ACT team this morning and leave to go see her daughter.  Total Discharge Time:  Greater than 30 minutes.  SignedNanine Means PMh-NP  08/30/2012, 8:50 AM

## 2012-08-30 NOTE — Progress Notes (Signed)
Writer spoke with patient after she attended group this evening. Patient reports that she has had a good day and is looking forward to being discharged on tomorrow early because she has an appointment to meet her daughter at 10 am. Patient request her medications be given at 2100 because she would like to get a good nights rest before tomorrow. Patient currently denies si/hi/a/v hallucinations. Support and encouragement offered, safety maintained on unit, will continue to monitor.

## 2012-09-01 NOTE — Discharge Summary (Signed)
Reviewed

## 2012-09-02 NOTE — Progress Notes (Signed)
Patient Discharge Instructions:  After Visit Summary (AVS):   Faxed to:  09/02/12 Discharge Summary Note:   Faxed to:  09/02/12 Psychiatric Admission Assessment Note:   Faxed to:  09/02/12 Suicide Risk Assessment - Discharge Assessment:   Faxed to:  09/02/12 Faxed/Sent to the Next Level Care provider:  09/02/12 Faxed to [facility] @ [phone]  Jerelene Redden, 09/02/2012, 4:11 PM

## 2012-09-02 NOTE — Progress Notes (Signed)
[facility] Adult Case Management Discharge Plan :  "Late Entry for Monday, August 30, 2012"    Will you be returning to the same living situation after discharge: Yes,  Patient returning to her home. At discharge, do you have transportation home?:Yes,  Patient transported by [facility]  ACT TEAM Do you have the ability to pay for your medications:Yes,  Patient has Medicaid.  Release of information consent forms completed and in the chart;  Patient's signature needed at discharge.  Patient to Follow up at: Follow-up Information   Follow up with [facility] On 08/30/2012. (You will be seen by [facility] ACT Team Monday, August 30, 2012 at 8 AM.  Referral # [id number])    Contact information:   [address] [place], [place]   [address]      Patient denies SI/HI:   Yes,  Patient was not endorsing SI/HI or thoughts of self harm.    Safety Planning and Suicide Prevention discussed:  Yes,  Reviewed during aftecare groups.  Wynn Banker 09/02/2012, 9:21 AM

## 2013-01-09 DIAGNOSIS — Z5320 Procedure and treatment not carried out because of patient's decision for unspecified reasons: Secondary | ICD-10-CM | POA: Diagnosis not present

## 2013-01-09 DIAGNOSIS — L293 Anogenital pruritus, unspecified: Secondary | ICD-10-CM | POA: Diagnosis not present

## 2013-01-18 DIAGNOSIS — IMO0002 Reserved for concepts with insufficient information to code with codable children: Secondary | ICD-10-CM | POA: Diagnosis not present

## 2013-01-18 DIAGNOSIS — F17200 Nicotine dependence, unspecified, uncomplicated: Secondary | ICD-10-CM | POA: Diagnosis not present

## 2013-01-18 DIAGNOSIS — N200 Calculus of kidney: Secondary | ICD-10-CM | POA: Diagnosis not present

## 2013-01-18 DIAGNOSIS — N201 Calculus of ureter: Secondary | ICD-10-CM | POA: Diagnosis not present

## 2013-01-18 DIAGNOSIS — N1330 Unspecified hydronephrosis: Secondary | ICD-10-CM | POA: Diagnosis not present

## 2013-01-18 DIAGNOSIS — Z79899 Other long term (current) drug therapy: Secondary | ICD-10-CM | POA: Diagnosis not present

## 2013-01-18 DIAGNOSIS — M129 Arthropathy, unspecified: Secondary | ICD-10-CM | POA: Diagnosis not present

## 2013-01-18 DIAGNOSIS — J45909 Unspecified asthma, uncomplicated: Secondary | ICD-10-CM | POA: Diagnosis not present

## 2013-01-21 DIAGNOSIS — N200 Calculus of kidney: Secondary | ICD-10-CM | POA: Diagnosis not present

## 2013-01-21 DIAGNOSIS — R350 Frequency of micturition: Secondary | ICD-10-CM | POA: Diagnosis not present

## 2013-01-21 DIAGNOSIS — R3915 Urgency of urination: Secondary | ICD-10-CM | POA: Diagnosis not present

## 2013-01-21 DIAGNOSIS — R351 Nocturia: Secondary | ICD-10-CM | POA: Diagnosis not present

## 2013-01-24 DIAGNOSIS — R635 Abnormal weight gain: Secondary | ICD-10-CM | POA: Diagnosis not present

## 2013-01-24 DIAGNOSIS — F17200 Nicotine dependence, unspecified, uncomplicated: Secondary | ICD-10-CM | POA: Diagnosis not present

## 2013-01-26 DIAGNOSIS — N200 Calculus of kidney: Secondary | ICD-10-CM | POA: Diagnosis not present

## 2013-01-26 DIAGNOSIS — R350 Frequency of micturition: Secondary | ICD-10-CM | POA: Diagnosis not present

## 2013-01-26 DIAGNOSIS — R351 Nocturia: Secondary | ICD-10-CM | POA: Diagnosis not present

## 2013-01-26 DIAGNOSIS — R3915 Urgency of urination: Secondary | ICD-10-CM | POA: Diagnosis not present

## 2013-01-31 DIAGNOSIS — N200 Calculus of kidney: Secondary | ICD-10-CM | POA: Diagnosis not present

## 2013-02-04 DIAGNOSIS — J45909 Unspecified asthma, uncomplicated: Secondary | ICD-10-CM | POA: Diagnosis not present

## 2013-02-04 DIAGNOSIS — F17200 Nicotine dependence, unspecified, uncomplicated: Secondary | ICD-10-CM | POA: Diagnosis not present

## 2013-02-04 DIAGNOSIS — Z79899 Other long term (current) drug therapy: Secondary | ICD-10-CM | POA: Diagnosis not present

## 2013-02-04 DIAGNOSIS — J029 Acute pharyngitis, unspecified: Secondary | ICD-10-CM | POA: Diagnosis not present

## 2013-02-08 DIAGNOSIS — G44209 Tension-type headache, unspecified, not intractable: Secondary | ICD-10-CM | POA: Diagnosis not present

## 2013-02-20 DIAGNOSIS — J329 Chronic sinusitis, unspecified: Secondary | ICD-10-CM | POA: Diagnosis not present

## 2013-02-20 DIAGNOSIS — J45909 Unspecified asthma, uncomplicated: Secondary | ICD-10-CM | POA: Diagnosis not present

## 2013-02-20 DIAGNOSIS — J209 Acute bronchitis, unspecified: Secondary | ICD-10-CM | POA: Diagnosis not present

## 2013-02-20 DIAGNOSIS — F17200 Nicotine dependence, unspecified, uncomplicated: Secondary | ICD-10-CM | POA: Diagnosis not present

## 2013-02-20 DIAGNOSIS — Z79899 Other long term (current) drug therapy: Secondary | ICD-10-CM | POA: Diagnosis not present

## 2013-02-20 DIAGNOSIS — J019 Acute sinusitis, unspecified: Secondary | ICD-10-CM | POA: Diagnosis not present

## 2013-02-21 DIAGNOSIS — J0110 Acute frontal sinusitis, unspecified: Secondary | ICD-10-CM | POA: Diagnosis not present

## 2013-02-27 DIAGNOSIS — Z79899 Other long term (current) drug therapy: Secondary | ICD-10-CM | POA: Diagnosis not present

## 2013-02-27 DIAGNOSIS — M129 Arthropathy, unspecified: Secondary | ICD-10-CM | POA: Diagnosis not present

## 2013-02-27 DIAGNOSIS — J019 Acute sinusitis, unspecified: Secondary | ICD-10-CM | POA: Diagnosis not present

## 2013-02-27 DIAGNOSIS — F17200 Nicotine dependence, unspecified, uncomplicated: Secondary | ICD-10-CM | POA: Diagnosis not present

## 2013-02-27 DIAGNOSIS — J45909 Unspecified asthma, uncomplicated: Secondary | ICD-10-CM | POA: Diagnosis not present

## 2013-02-27 DIAGNOSIS — J329 Chronic sinusitis, unspecified: Secondary | ICD-10-CM | POA: Diagnosis not present

## 2013-02-28 DIAGNOSIS — R3915 Urgency of urination: Secondary | ICD-10-CM | POA: Diagnosis not present

## 2013-02-28 DIAGNOSIS — N200 Calculus of kidney: Secondary | ICD-10-CM | POA: Diagnosis not present

## 2013-02-28 DIAGNOSIS — R350 Frequency of micturition: Secondary | ICD-10-CM | POA: Diagnosis not present

## 2013-02-28 DIAGNOSIS — R351 Nocturia: Secondary | ICD-10-CM | POA: Diagnosis not present

## 2013-03-17 DIAGNOSIS — R635 Abnormal weight gain: Secondary | ICD-10-CM | POA: Diagnosis not present

## 2013-04-12 DIAGNOSIS — R05 Cough: Secondary | ICD-10-CM | POA: Diagnosis not present

## 2013-04-12 DIAGNOSIS — M129 Arthropathy, unspecified: Secondary | ICD-10-CM | POA: Diagnosis not present

## 2013-04-12 DIAGNOSIS — F411 Generalized anxiety disorder: Secondary | ICD-10-CM | POA: Diagnosis not present

## 2013-04-12 DIAGNOSIS — R059 Cough, unspecified: Secondary | ICD-10-CM | POA: Diagnosis not present

## 2013-04-12 DIAGNOSIS — F319 Bipolar disorder, unspecified: Secondary | ICD-10-CM | POA: Diagnosis not present

## 2013-04-12 DIAGNOSIS — M4120 Other idiopathic scoliosis, site unspecified: Secondary | ICD-10-CM | POA: Diagnosis not present

## 2013-04-12 DIAGNOSIS — IMO0002 Reserved for concepts with insufficient information to code with codable children: Secondary | ICD-10-CM | POA: Diagnosis not present

## 2013-04-12 DIAGNOSIS — Z79899 Other long term (current) drug therapy: Secondary | ICD-10-CM | POA: Diagnosis not present

## 2013-04-12 DIAGNOSIS — F17200 Nicotine dependence, unspecified, uncomplicated: Secondary | ICD-10-CM | POA: Diagnosis not present

## 2013-04-12 DIAGNOSIS — J45901 Unspecified asthma with (acute) exacerbation: Secondary | ICD-10-CM | POA: Diagnosis not present

## 2013-04-12 DIAGNOSIS — J9819 Other pulmonary collapse: Secondary | ICD-10-CM | POA: Diagnosis not present

## 2013-04-19 DIAGNOSIS — R109 Unspecified abdominal pain: Secondary | ICD-10-CM | POA: Diagnosis not present

## 2013-04-19 DIAGNOSIS — J40 Bronchitis, not specified as acute or chronic: Secondary | ICD-10-CM | POA: Diagnosis not present

## 2013-04-19 DIAGNOSIS — N200 Calculus of kidney: Secondary | ICD-10-CM | POA: Diagnosis not present

## 2013-04-19 DIAGNOSIS — N390 Urinary tract infection, site not specified: Secondary | ICD-10-CM | POA: Diagnosis not present

## 2013-04-20 ENCOUNTER — Encounter: Payer: Self-pay | Admitting: Cardiology

## 2013-04-20 DIAGNOSIS — N200 Calculus of kidney: Secondary | ICD-10-CM | POA: Diagnosis not present

## 2013-04-20 DIAGNOSIS — K5900 Constipation, unspecified: Secondary | ICD-10-CM | POA: Diagnosis not present

## 2013-04-20 DIAGNOSIS — N390 Urinary tract infection, site not specified: Secondary | ICD-10-CM | POA: Diagnosis not present

## 2013-04-20 DIAGNOSIS — J40 Bronchitis, not specified as acute or chronic: Secondary | ICD-10-CM | POA: Diagnosis not present

## 2013-04-20 DIAGNOSIS — Z79899 Other long term (current) drug therapy: Secondary | ICD-10-CM | POA: Diagnosis not present

## 2013-04-20 DIAGNOSIS — R3915 Urgency of urination: Secondary | ICD-10-CM | POA: Diagnosis not present

## 2013-04-20 DIAGNOSIS — F17200 Nicotine dependence, unspecified, uncomplicated: Secondary | ICD-10-CM | POA: Diagnosis not present

## 2013-04-20 DIAGNOSIS — R059 Cough, unspecified: Secondary | ICD-10-CM | POA: Diagnosis not present

## 2013-04-27 DIAGNOSIS — R635 Abnormal weight gain: Secondary | ICD-10-CM | POA: Diagnosis not present

## 2013-05-12 ENCOUNTER — Inpatient Hospital Stay (HOSPITAL_COMMUNITY)
Admission: AD | Admit: 2013-05-12 | Discharge: 2013-05-16 | DRG: 885 | Disposition: A | Discharge status: Home / Self Care | Payer: Medicare Other | Source: Intra-hospital | Attending: Psychiatry | Admitting: Psychiatry

## 2013-05-12 ENCOUNTER — Encounter (HOSPITAL_COMMUNITY): Payer: Self-pay | Admitting: Emergency Medicine

## 2013-05-12 ENCOUNTER — Emergency Department (HOSPITAL_COMMUNITY)
Admission: EM | Admit: 2013-05-12 | Discharge: 2013-05-12 | Disposition: A | Discharge status: Other Acute Inpatient Hospital | Payer: Medicare Other | Attending: Emergency Medicine | Admitting: Emergency Medicine

## 2013-05-12 DIAGNOSIS — K769 Liver disease, unspecified: Secondary | ICD-10-CM

## 2013-05-12 DIAGNOSIS — F411 Generalized anxiety disorder: Secondary | ICD-10-CM | POA: Diagnosis not present

## 2013-05-12 DIAGNOSIS — Z79899 Other long term (current) drug therapy: Secondary | ICD-10-CM | POA: Diagnosis not present

## 2013-05-12 DIAGNOSIS — R45851 Suicidal ideations: Secondary | ICD-10-CM | POA: Insufficient documentation

## 2013-05-12 DIAGNOSIS — Z8719 Personal history of other diseases of the digestive system: Secondary | ICD-10-CM | POA: Insufficient documentation

## 2013-05-12 DIAGNOSIS — F3130 Bipolar disorder, current episode depressed, mild or moderate severity, unspecified: Secondary | ICD-10-CM | POA: Diagnosis not present

## 2013-05-12 DIAGNOSIS — F17200 Nicotine dependence, unspecified, uncomplicated: Secondary | ICD-10-CM | POA: Diagnosis not present

## 2013-05-12 DIAGNOSIS — F419 Anxiety disorder, unspecified: Secondary | ICD-10-CM

## 2013-05-12 DIAGNOSIS — M129 Arthropathy, unspecified: Secondary | ICD-10-CM | POA: Insufficient documentation

## 2013-05-12 DIAGNOSIS — IMO0001 Reserved for inherently not codable concepts without codable children: Secondary | ICD-10-CM | POA: Diagnosis present

## 2013-05-12 DIAGNOSIS — F319 Bipolar disorder, unspecified: Secondary | ICD-10-CM | POA: Diagnosis not present

## 2013-05-12 DIAGNOSIS — Z8679 Personal history of other diseases of the circulatory system: Secondary | ICD-10-CM | POA: Insufficient documentation

## 2013-05-12 DIAGNOSIS — F314 Bipolar disorder, current episode depressed, severe, without psychotic features: Principal | ICD-10-CM | POA: Diagnosis present

## 2013-05-12 DIAGNOSIS — Z008 Encounter for other general examination: Secondary | ICD-10-CM | POA: Diagnosis present

## 2013-05-12 DIAGNOSIS — J45909 Unspecified asthma, uncomplicated: Secondary | ICD-10-CM | POA: Diagnosis present

## 2013-05-12 DIAGNOSIS — Z880 Allergy status to penicillin: Secondary | ICD-10-CM | POA: Diagnosis not present

## 2013-05-12 DIAGNOSIS — H8109 Meniere's disease, unspecified ear: Secondary | ICD-10-CM | POA: Diagnosis present

## 2013-05-12 LAB — COMPREHENSIVE METABOLIC PANEL
ALT: 10 U/L (ref 0–35)
Albumin: 3.8 g/dL (ref 3.5–5.2)
Calcium: 9.6 mg/dL (ref 8.4–10.5)
GFR calc Af Amer: >90 mL/min (ref >90)
Glucose, Bld: 129 mg/dL — ABNORMAL HIGH (ref 70–99)
Sodium: 140 mEq/L (ref 135–145)
Total Protein: 6.9 g/dL (ref 6.0–8.3)

## 2013-05-12 LAB — CBC WITH DIFFERENTIAL/PLATELET
Eosinophils Relative: 3 % (ref 0–5)
HCT: 41.5 % (ref 36.0–46.0)
Hemoglobin: 14.3 g/dL (ref 12.0–15.0)
Lymphocytes Relative: 41 % (ref 12–46)
Lymphs Abs: 4 10*3/uL (ref 0.7–4.0)
MCV: 84.9 fL (ref 78.0–100.0)
Monocytes Absolute: 0.7 10*3/uL (ref 0.1–1.0)
Monocytes Relative: 8 % (ref 3–12)
Neutro Abs: 4.8 10*3/uL (ref 1.7–7.7)
WBC: 9.8 10*3/uL (ref 4.0–10.5)

## 2013-05-12 LAB — RAPID URINE DRUG SCREEN, HOSP PERFORMED
Amphetamines: NOT DETECTED
Barbiturates: NOT DETECTED
Benzodiazepines: NOT DETECTED
Tetrahydrocannabinol: NOT DETECTED

## 2013-05-12 MED ORDER — ACETAMINOPHEN 325 MG PO TABS: 650.0000 mg | ORAL_TABLET | ORAL | Status: DC | PRN

## 2013-05-12 MED ORDER — GABAPENTIN 300 MG PO CAPS
ORAL_CAPSULE | ORAL | Status: AC
  Filled 2013-05-12: qty 1

## 2013-05-12 MED ORDER — RISPERIDONE 1 MG PO TABS
ORAL_TABLET | ORAL | Status: AC
  Filled 2013-05-12: qty 2

## 2013-05-12 MED ORDER — HYDROXYZINE HCL 25 MG PO TABS: 25.0000 mg | ORAL_TABLET | Freq: Four times a day (QID) | ORAL | Status: DC | PRN

## 2013-05-12 MED ORDER — GABAPENTIN 300 MG PO CAPS
300.0000 mg | ORAL_CAPSULE | Freq: Two times a day (BID) | ORAL | Status: DC
  Administered 2013-05-12: 300 mg via ORAL
  Filled 2013-05-12 (×4): qty 1

## 2013-05-12 MED ORDER — LAMOTRIGINE 100 MG PO TABS
200.0000 mg | ORAL_TABLET | Freq: Every day | ORAL | Status: DC
  Administered 2013-05-12: 200 mg via ORAL
  Filled 2013-05-12 (×2): qty 2

## 2013-05-12 MED ORDER — ONDANSETRON HCL 4 MG PO TABS: 4.0000 mg | ORAL_TABLET | Freq: Three times a day (TID) | ORAL | Status: DC | PRN

## 2013-05-12 MED ORDER — LAMOTRIGINE 25 MG PO TABS
ORAL_TABLET | ORAL | Status: AC
Start: 2013-05-12 — End: 2013-05-12
  Filled 2013-05-12: qty 8

## 2013-05-12 MED ORDER — NICOTINE 21 MG/24HR TD PT24: 21.0000 mg | MEDICATED_PATCH | Freq: Every day | TRANSDERMAL | Status: DC

## 2013-05-12 MED ORDER — LORAZEPAM 1 MG PO TABS: 1.0000 mg | ORAL_TABLET | Freq: Three times a day (TID) | ORAL | Status: DC | PRN

## 2013-05-12 MED ORDER — RISPERIDONE 1 MG PO TABS
2.0000 mg | ORAL_TABLET | Freq: Every day | ORAL | Status: DC
  Administered 2013-05-12: 2 mg via ORAL
  Filled 2013-05-12 (×2): qty 2

## 2013-05-12 MED ORDER — ZOLPIDEM TARTRATE 5 MG PO TABS: 5.0000 mg | ORAL_TABLET | Freq: Every evening | ORAL | Status: DC | PRN

## 2013-05-12 NOTE — ED Notes (Signed)
Pt from [facility].  Here due to suicidal thoughts.

## 2013-05-12 NOTE — ED Provider Notes (Signed)
CSN: [id number]     Arrival date & time 05/12/13  1700 History   First MD Initiated Contact with Patient 05/12/13 1850     Chief Complaint  Patient presents with  . V70.1   (Consider location/radiation/quality/duration/timing/severity/associated sxs/prior Treatment) HPI Reports feeling suicidal for the past several days because she cannot see her daughter. Plan would be to slice her wrists She presents for psychiatric evaluation. No other associated symptoms. She does have prior attempts. Past Medical History  Diagnosis Date  . Asthma   . Bipolar 1 disorder   . Diverticulosis   . Arthritis   . S/P colonoscopy     Dr. Karilyn Cota 2010: few small diverticula at sigmoid. otherwise normal.   . Dysrhythmia     sts "I have heart palpitations"  . Shortness of breath   . Meniere's disease   . H/O hiatal hernia     4  . Headache(784.0)   . Fibromyalgia    suicide attempt Past Surgical History  Procedure Laterality Date  . Tonsillectomy and adenoidectomy    . Umbilical hernia repair  Dec 2011    Dr. Lovell Sheehan  . Abdominal hysterectomy    . Hemorrhoid surgery    . Cholecystectomy    . Foot surgery    . Left ovarian removal    . Cesarean section    . Right ovarian removal    . Exploratory laparoscopy    . Wisdom teeth removal    . Multiple hernia repairs    . Tubes in ears    . Tonsillectomy    . Laparoscopy  02/19/2011    Procedure: LAPAROSCOPY DIAGNOSTIC;  Surgeon: Dalia Heading;  Location: [facility];  Service: General;  Laterality: N/A;  . Laparoscopic appendectomy  02/19/2011    Procedure: APPENDECTOMY LAPAROSCOPIC;  Surgeon: Dalia Heading;  Location: [facility];  Service: General;  Laterality: N/A;  . Appendectomy     Family History  Problem Relation Age of Onset  . Thyroid disease Mother   . Colon cancer      paternal and maternal grandfather  . Anesthesia problems Neg Hx   . Hypotension Neg Hx   . Malignant hyperthermia Neg Hx   . Pseudochol deficiency Neg Hx   . Meniere's  disease Other   . Colon cancer Father   . Colon cancer Brother    History  Substance Use Topics  . Smoking status: Current Every Day Smoker -- 0.50 packs/day for 20 years    Types: Cigarettes    Start date: 07/07/1981  . Smokeless tobacco: Never Used  . Alcohol Use: No     Comment: not since June 2012   no illicit drug use OB History   Grav Para Term Preterm Abortions TAB SAB Ect Mult Living   5 2 2  3  2   2      Review of Systems  Constitutional: Negative.   HENT: Negative.   Respiratory: Negative.   Cardiovascular: Negative.   Gastrointestinal: Negative.   Musculoskeletal: Negative.   Skin: Negative.   Neurological: Negative.   Psychiatric/Behavioral: Positive for suicidal ideas.  All other systems reviewed and are negative.    Allergies  Amoxicillin  Home Medications   Current Outpatient Rx  Name  Route  Sig  Dispense  Refill  . gabapentin (NEURONTIN) 300 MG capsule   Oral   Take 1 capsule (300 mg total) by mouth 2 (two) times daily.   60 capsule   0   . hydrOXYzine (ATARAX/VISTARIL) 25  MG tablet   Oral   Take 1 tablet (25 mg total) by mouth every 6 (six) hours as needed for anxiety.   30 tablet   0   . lamoTRIgine (LAMICTAL) 200 MG tablet   Oral   Take 1 tablet (200 mg total) by mouth at bedtime.   30 tablet   0   . risperiDONE (RISPERDAL) 2 MG tablet   Oral   Take 1 tablet (2 mg total) by mouth at bedtime.   30 tablet   0   . zolpidem (AMBIEN) 5 MG tablet   Oral   Take 1 tablet (5 mg total) by mouth at bedtime as needed for sleep.   30 tablet   0    BP 113/73  Pulse 72  Temp(Src) 97.5 F (36.4 C) (Oral)  Resp 20  Ht 5\' 6"  (1.676 m)  Wt 186 lb (84.369 kg)  BMI 30.04 kg/m2  SpO2 100% Physical Exam  Nursing note and vitals reviewed. Constitutional: She is oriented to person, place, and time. She appears well-developed and well-nourished.  HENT:  Head: Normocephalic and atraumatic.  Eyes: Conjunctivae are normal. Pupils are equal,  round, and reactive to light.  Neck: Neck supple. No tracheal deviation present. No thyromegaly present.  Cardiovascular: Normal rate and regular rhythm.   No murmur heard. Pulmonary/Chest: Effort normal and breath sounds normal.  Abdominal: Soft. Bowel sounds are normal. She exhibits no distension. There is no tenderness.  Musculoskeletal: Normal range of motion. She exhibits no edema and no tenderness.  Neurological: She is alert and oriented to person, place, and time. No cranial nerve deficit. Coordination normal.  Skin: Skin is warm and dry. No rash noted.  Psychiatric:  Appears sad    ED Course  Procedures (including critical care time) Labs Review Labs Reviewed - No data to display Imaging Review No results found.  EKG Interpretation   None      Results for orders placed during the hospital encounter of 05/12/13  CBC WITH DIFFERENTIAL      Result Value Range   WBC 9.8  4.0 - 10.5 K/uL   RBC 4.89  3.87 - 5.11 MIL/uL   Hemoglobin 14.3  12.0 - 15.0 g/dL   HCT 40.9  81.1 - 91.4 %   MCV 84.9  78.0 - 100.0 fL   MCH 29.2  26.0 - 34.0 pg   MCHC 34.5  30.0 - 36.0 g/dL   RDW 78.2  95.6 - 21.3 %   Platelets 283  150 - 400 K/uL   Neutrophils Relative % 49  43 - 77 %   Neutro Abs 4.8  1.7 - 7.7 K/uL   Lymphocytes Relative 41  12 - 46 %   Lymphs Abs 4.0  0.7 - 4.0 K/uL   Monocytes Relative 8  3 - 12 %   Monocytes Absolute 0.7  0.1 - 1.0 K/uL   Eosinophils Relative 3  0 - 5 %   Eosinophils Absolute 0.3  0.0 - 0.7 K/uL   Basophils Relative 1  0 - 1 %   Basophils Absolute 0.1  0.0 - 0.1 K/uL  URINE RAPID DRUG SCREEN (HOSP PERFORMED)      Result Value Range   Opiates NONE DETECTED  NONE DETECTED   Cocaine NONE DETECTED  NONE DETECTED   Benzodiazepines NONE DETECTED  NONE DETECTED   Amphetamines NONE DETECTED  NONE DETECTED   Tetrahydrocannabinol NONE DETECTED  NONE DETECTED   Barbiturates NONE DETECTED  NONE DETECTED  COMPREHENSIVE  METABOLIC PANEL      Result Value Range    Sodium 140  135 - 145 mEq/L   Potassium 3.3 (*) 3.5 - 5.1 mEq/L   Chloride 105  96 - 112 mEq/L   CO2 24  19 - 32 mEq/L   Glucose, Bld 129 (*) 70 - 99 mg/dL   BUN 13  6 - 23 mg/dL   Creatinine, Ser 1.61  0.50 - 1.10 mg/dL   Calcium 9.6  8.4 - 09.6 mg/dL   Total Protein 6.9  6.0 - 8.3 g/dL   Albumin 3.8  3.5 - 5.2 g/dL   AST 11  0 - 37 U/L   ALT 10  0 - 35 U/L   Alkaline Phosphatase 53  39 - 117 U/L   Total Bilirubin 0.2 (*) 0.3 - 1.2 mg/dL   GFR calc non Af Amer 85 (*) >90 mL/min   GFR calc Af Amer >90  >90 mL/min  ACETAMINOPHEN LEVEL      Result Value Range   Acetaminophen (Tylenol), Serum <15.0  10 - 30 ug/mL  SALICYLATE LEVEL      Result Value Range   Salicylate Lvl <2.0 (*) 2.8 - 20.0 mg/dL   No results found.  MDM  No diagnosis found. I feel the patient warrants inpatient psychiatric state she is at risk for suicide in light of the fact that she can't see her doctor has prior suicide attempt.she[facility] stay by Dr. Hilton Cork #2 hypokalemia   Doug Sou, MD 05/12/13 2055

## 2013-05-12 NOTE — BH Assessment (Signed)
Tele Assessment Note   Allison Bolton is an 42 y.o. female.  Pt presents to [facility] with C/O Depression and SI. Pt denies a current plan but it is noted that patient reported a plan to cut her wrist to EDP Dr. Rennis Chris. Pt reports her suicidal ideations were triggered by her receiving a letter yesterday from the court terminating her parental rights. Pt reports that this made her angry and she began feeling suicidal. Pt reports that her 68 year old daughter was placed in foster care 2 years ago due to allegations of patient being a negligent parent.  Pt reports that she last saw her daughter a month a half ago.  Pt reports "it stresses me out", " not being able to see my daughter". Pt reports that she has no visitation rights at this time and currently has a Clinical research associate to assist her with this issue. Pt reports that she is  Bipolar and currently is a patient on the ACTT team at [facility] . Pt reports that she was recently started on Visteril and "another medication for my anxiety". Pt denies HI and no AVH reported. Pt is unable to reliably contract for safety and inpatient treatment recommended for safety and stabilization.  Consulted with [facility] Thurman Coyer and Dr. Hilton Cork who agreed to admit patient to [facility] for inpatient treatment. Pt assigned to bed 504-1 assigned to Dr. Elsie Saas for treatment.  Notified EDP Dr.Jacobowitz that patient has been accepted to [facility] for inpatient treatment. Dr. Rennis Chris is in agreement with plan of pt being admitted and transferred to [facility].   Axis I: Mood Disorder NOS Axis II: Deferred Axis III:  Past Medical History  Diagnosis Date  . Asthma   . Bipolar 1 disorder   . Diverticulosis   . Arthritis   . S/P colonoscopy     Dr. Karilyn Cota 2010: few small diverticula at sigmoid. otherwise normal.   . Dysrhythmia     sts "I have heart palpitations"  . Shortness of breath   . Meniere's disease   . H/O hiatal hernia     4  . Headache(784.0)   . Fibromyalgia    Axis IV:  other psychosocial or environmental problems, problems related to legal system/crime and problems related to social environment Axis V: 31-40 impairment in reality testing  Past Medical History:  Past Medical History  Diagnosis Date  . Asthma   . Bipolar 1 disorder   . Diverticulosis   . Arthritis   . S/P colonoscopy     Dr. Karilyn Cota 2010: few small diverticula at sigmoid. otherwise normal.   . Dysrhythmia     sts "I have heart palpitations"  . Shortness of breath   . Meniere's disease   . H/O hiatal hernia     4  . Headache(784.0)   . Fibromyalgia     Past Surgical History  Procedure Laterality Date  . Tonsillectomy and adenoidectomy    . Umbilical hernia repair  Dec 2011    Dr. Lovell Sheehan  . Abdominal hysterectomy    . Hemorrhoid surgery    . Cholecystectomy    . Foot surgery    . Left ovarian removal    . Cesarean section    . Right ovarian removal    . Exploratory laparoscopy    . Wisdom teeth removal    . Multiple hernia repairs    . Tubes in ears    . Tonsillectomy    . Laparoscopy  02/19/2011    Procedure: LAPAROSCOPY DIAGNOSTIC;  Surgeon: Dalia Heading;  Location: [facility];  Service: General;  Laterality: N/A;  . Laparoscopic appendectomy  02/19/2011    Procedure: APPENDECTOMY LAPAROSCOPIC;  Surgeon: Dalia Heading;  Location: [facility];  Service: General;  Laterality: N/A;  . Appendectomy      Family History:  Family History  Problem Relation Age of Onset  . Thyroid disease Mother   . Colon cancer      paternal and maternal grandfather  . Anesthesia problems Neg Hx   . Hypotension Neg Hx   . Malignant hyperthermia Neg Hx   . Pseudochol deficiency Neg Hx   . Meniere's disease Other   . Colon cancer Father   . Colon cancer Brother     Social History:  reports that she has been smoking Cigarettes.  She started smoking about 31 years ago. She has a 10 pack-year smoking history. She has never used smokeless tobacco. She reports that she does not drink alcohol or  use illicit drugs.  Additional Social History:     CIWA: CIWA-Ar BP: 125/85 mmHg Pulse Rate: 67 COWS:    Allergies:  Allergies  Allergen Reactions  . Amoxicillin Other (See Comments)    Patient states she feels sunburnt when taking amoxicillin    Home Medications:  (Not in a hospital admission)  OB/GYN Status:  No LMP recorded. Patient has had a hysterectomy.  General Assessment Data Location of Assessment: [facility] Assessment Services Is this a Tele or Face-to-Face Assessment?: Tele Assessment Is this an Initial Assessment or a Re-assessment for this encounter?: Initial Assessment Living Arrangements: Alone Can pt return to current living arrangement?: Yes Admission Status: Voluntary Is patient capable of signing voluntary admission?: Yes Transfer from: Other (Comment) Referral Source: Other ([facility] ACTT team referred patient to [facility])     [facility] Crisis Care Plan Living Arrangements: Alone Name of Psychiatrist: Dr. Georges Lynch at [facility] Name of Therapist: No Current Provider     Risk to self Suicidal Ideation: Yes-Currently Present Suicidal Intent: Yes-Currently Present Is patient at risk for suicide?: Yes Suicidal Plan?: Yes-Currently Present (pt denies plan to TTS, but endorsed plan to cut wrist to EDP) Specify Current Suicidal Plan:  (Pt denies current plan but reported a plan to EDP per Epic ) Access to Means: No What has been your use of drugs/alcohol within the last 12 months?: none reported Previous Attempts/Gestures: Yes How many times?: 1 (pt reports 1 previous suicide attempt by o/d ) Other Self Harm Risks: none reported Triggers for Past Attempts: Other personal contacts Intentional Self Injurious Behavior: None Family Suicide History: No (Pt reports family hx of Bipolar) Recent stressful life event(s): Conflict (Comment);Turmoil (Comment) Persecutory voices/beliefs?: No Depression: Yes Depression Symptoms: Feeling worthless/self pity;Feeling  angry/irritable Substance abuse history and/or treatment for substance abuse?: No Suicide prevention information given to non-admitted patients: Not applicable  Risk to Others Homicidal Ideation: No Thoughts of Harm to Others: No Current Homicidal Intent: No Current Homicidal Plan: No Access to Homicidal Means: No Identified Victim: na History of harm to others?: No Assessment of Violence: None Noted Violent Behavior Description: None Reported Does patient have access to weapons?: No Criminal Charges Pending?: No Does patient have a court date: Yes Court Date: 07/26/13  Psychosis Hallucinations: None noted Delusions: None noted  Mental Status Report Appear/Hygiene:  (Unremarkable) Eye Contact: Good Motor Activity: Freedom of movement Speech: Logical/coherent Level of Consciousness: Alert Mood: Depressed Affect: Appropriate to circumstance;Depressed Anxiety Level: Minimal Thought Processes: Coherent;Relevant Judgement: Unimpaired Orientation: Person;Place;Time;Situation Obsessive Compulsive Thoughts/Behaviors:  None  Cognitive Functioning Concentration: Normal Memory: Recent Intact;Remote Intact IQ: Average Insight: Fair Impulse Control: Fair Appetite: Fair Weight Loss: 0 Weight Gain: 0 Sleep: No Change Total Hours of Sleep:  (6-8 hours) Vegetative Symptoms: None  ADLScreening [facility] Assessment Services) Patient's cognitive ability adequate to safely complete daily activities?: Yes Patient able to express need for assistance with ADLs?: Yes Independently performs ADLs?: Yes (appropriate for developmental age)  Prior Inpatient Therapy Prior Inpatient Therapy: Yes Prior Therapy Dates: a year and a half ago Prior Therapy Facilty/Provider(s): [facility] and [facility] Reason for Treatment: Depression, SI, Overdose  Prior Outpatient Therapy Prior Outpatient Therapy: Yes Prior Therapy Dates: Current Provider Prior Therapy Facilty/Provider(s): [facility]  Reason for  Treatment: ACTT Team and Medication Management  ADL Screening (condition at time of admission) Patient's cognitive ability adequate to safely complete daily activities?: Yes Patient able to express need for assistance with ADLs?: Yes Independently performs ADLs?: Yes (appropriate for developmental age)         Values / Beliefs Cultural Requests During Hospitalization: None Spiritual Requests During Hospitalization: None        Additional Information 1:1 In Past 12 Months?: No CIRT Risk: No Elopement Risk: No Does patient have medical clearance?: Yes     Disposition:  Disposition Initial Assessment Completed for this Encounter: Yes Disposition of Patient: Inpatient treatment program Type of inpatient treatment program: Adult  Bjorn Pippin 05/12/2013 11:17 PM

## 2013-05-12 NOTE — BH Assessment (Signed)
Consulted with Dr. Rennis Chris who is requesting TTS consult. Dr. Rennis Chris reports that patient is Suicidal and Depressed with a plan to cut her wrist. Informed Dr. Rennis Chris that TTS wil assess patient at 2000.   Glorious Peach, MS, LCASA Assessment Counselor

## 2013-05-12 NOTE — BH Assessment (Signed)
Consulted with [facility] Thurman Coyer and Dr. Hilton Cork who agreed to admit patient to [facility] for inpatient treatment. Pt assigned to bed 504-1 assigned to Dr. Elsie Saas for treatment.  Notified EDP Dr.Jacobowitz that patient has been accepted to [facility] for inpatient treatment. Dr. Rennis Chris is in agreement with plan of pt being admitted and transferred to [facility].  Informed pt's nurse of pt's acceptance and requested that patient's nurse have patient sign Voluntary Consent form. Pt's nurse agreed to have patient complete voluntary admission form and will fax to [facility].  Glorious Peach, MS, LCASA Assessment Counselor

## 2013-05-12 NOTE — ED Notes (Signed)
Calm, cooperative, concerned about her medications. Printed out a list of her current meds. [facility] here for pt. Waiting now for security to bring her locked up valuables

## 2013-05-13 ENCOUNTER — Encounter (HOSPITAL_COMMUNITY): Payer: Self-pay

## 2013-05-13 DIAGNOSIS — F3130 Bipolar disorder, current episode depressed, mild or moderate severity, unspecified: Secondary | ICD-10-CM

## 2013-05-13 DIAGNOSIS — F314 Bipolar disorder, current episode depressed, severe, without psychotic features: Principal | ICD-10-CM | POA: Diagnosis present

## 2013-05-13 LAB — LIPID PANEL
HDL: 37 mg/dL — ABNORMAL LOW (ref >39)
LDL Cholesterol: 88 mg/dL (ref 0–99)

## 2013-05-13 LAB — HEMOGLOBIN A1C: Mean Plasma Glucose: 114 mg/dL (ref <117)

## 2013-05-13 LAB — T4, FREE: Free T4: 0.95 ng/dL (ref 0.80–1.80)

## 2013-05-13 MED ORDER — TRAZODONE HCL 100 MG PO TABS
100.0000 mg | ORAL_TABLET | Freq: Every evening | ORAL | Status: DC | PRN
  Administered 2013-05-13: 100 mg via ORAL
  Filled 2013-05-13: qty 1

## 2013-05-13 MED ORDER — BUSPIRONE HCL 10 MG PO TABS
10.0000 mg | ORAL_TABLET | Freq: Two times a day (BID) | ORAL | Status: DC
  Administered 2013-05-13 – 2013-05-16 (×6): 10 mg via ORAL
  Filled 2013-05-13 (×10): qty 1

## 2013-05-13 MED ORDER — RISPERIDONE 2 MG PO TABS
2.0000 mg | ORAL_TABLET | Freq: Every day | ORAL | Status: DC
  Filled 2013-05-13 (×2): qty 1

## 2013-05-13 MED ORDER — MAGNESIUM HYDROXIDE 400 MG/5ML PO SUSP
30.0000 mL | Freq: Every day | ORAL | Status: DC | PRN
  Administered 2013-05-14: 30 mL via ORAL

## 2013-05-13 MED ORDER — OXYBUTYNIN CHLORIDE ER 5 MG PO TB24
5.0000 mg | ORAL_TABLET | Freq: Every day | ORAL | Status: DC
  Administered 2013-05-13: 5 mg via ORAL
  Filled 2013-05-13 (×3): qty 1

## 2013-05-13 MED ORDER — ACETAMINOPHEN 325 MG PO TABS
650.0000 mg | ORAL_TABLET | Freq: Four times a day (QID) | ORAL | Status: DC | PRN
  Administered 2013-05-14: 650 mg via ORAL
  Filled 2013-05-13: qty 2

## 2013-05-13 MED ORDER — OXYBUTYNIN CHLORIDE ER 5 MG PO TB24
5.0000 mg | ORAL_TABLET | Freq: Every day | ORAL | Status: DC
  Administered 2013-05-13 – 2013-05-15 (×3): 5 mg via ORAL
  Filled 2013-05-13 (×5): qty 1

## 2013-05-13 MED ORDER — ALUM & MAG HYDROXIDE-SIMETH 200-200-20 MG/5ML PO SUSP: 30.0000 mL | ORAL | Status: DC | PRN

## 2013-05-13 MED ORDER — TRAZODONE HCL 100 MG PO TABS: 100.0000 mg | ORAL_TABLET | Freq: Every evening | ORAL | Status: DC | PRN

## 2013-05-13 MED ORDER — RISPERIDONE 2 MG PO TABS
2.0000 mg | ORAL_TABLET | Freq: Every day | ORAL | Status: DC
  Administered 2013-05-13 – 2013-05-15 (×3): 2 mg via ORAL
  Filled 2013-05-13 (×5): qty 1

## 2013-05-13 MED ORDER — HYDROXYZINE HCL 25 MG PO TABS
25.0000 mg | ORAL_TABLET | Freq: Four times a day (QID) | ORAL | Status: DC | PRN
  Administered 2013-05-13 – 2013-05-15 (×4): 25 mg via ORAL
  Filled 2013-05-13 (×5): qty 1

## 2013-05-13 MED ORDER — HYDROXYZINE HCL 25 MG PO TABS
25.0000 mg | ORAL_TABLET | Freq: Four times a day (QID) | ORAL | Status: DC | PRN
  Administered 2013-05-13: 25 mg via ORAL
  Filled 2013-05-13: qty 1

## 2013-05-13 MED ORDER — MAGNESIUM HYDROXIDE 400 MG/5ML PO SUSP
30.0000 mL | Freq: Every day | ORAL | Status: DC | PRN
  Administered 2013-05-13: 30 mL via ORAL

## 2013-05-13 MED ORDER — TRAZODONE HCL 100 MG PO TABS
200.0000 mg | ORAL_TABLET | Freq: Every evening | ORAL | Status: DC | PRN
  Administered 2013-05-13 – 2013-05-15 (×3): 200 mg via ORAL
  Filled 2013-05-13 (×4): qty 2

## 2013-05-13 MED ORDER — BUSPIRONE HCL 10 MG PO TABS
10.0000 mg | ORAL_TABLET | Freq: Two times a day (BID) | ORAL | Status: DC
  Administered 2013-05-13: 10 mg via ORAL
  Filled 2013-05-13 (×3): qty 1

## 2013-05-13 MED ORDER — ACETAMINOPHEN 325 MG PO TABS: 650.0000 mg | ORAL_TABLET | Freq: Four times a day (QID) | ORAL | Status: DC | PRN

## 2013-05-13 NOTE — Progress Notes (Signed)
Pt is a 42 year old female admitted with depression and feeling suicidal  She does not shar a plan but reports being hopeless and helpless   She sites loosing parental rights as her only stressor  She is cooperative and pleasant during the admission and at times tearful   She also said she has not seen her child in over a month   She denies drug or etoh abuse   Pt was admitted to the 500 hallway and given nourishment   She was placed on Q 15 min checks and received medications as ordered   She is safe and adjusting well to the unit
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
## 2013-05-13 NOTE — Tx Team (Signed)
Interdisciplinary Treatment Plan Update   Date Reviewed:  05/13/2013  Time Reviewed:  9:35 AM  Progress in Treatment:   Attending groups: Yes Participating in groups: Yes Taking medication as prescribed: Yes  Tolerating medication: Yes Family/Significant other contact made:No, but will ask patient for consent for collateral contact Patient understands diagnosis: Yes  Discussing patient identified problems/goals with staff: Yes Medical problems stabilized or resolved: Yes Denies suicidal/homicidal ideation: Yes Patient has not harmed self or others: Yes  For review of initial/current patient goals, please see plan of care.  Estimated Length of Stay:  3-4 days  Reasons for Continued Hospitalization:  Anxiety Depression Medication stabilization   New Problems/Goals identified:    Discharge Plan or Barriers:   [name]  Additional Comments:  Pt [facility] to [facility] with C/O Depression and SI. Pt denies a current plan but it is noted that patient reported a plan to cut her wrist to EDP Dr. Rennis Chris. Pt reports her suicidal ideations were triggered by her receiving a letter yesterday from the court terminating her parental rights. Pt reports that this made her angry and she began feeling suicidal.   Attendees:  Patient:  05/13/2013 9:35 AM   Signature: Mervyn Gay, MD 05/13/2013 9:35 AM  Signature:  Verne Spurr, PA 05/13/2013 9:35 AM  Signature: Harold Barban, RN 05/13/2013 9:35 AM  Signature 05/13/2013 9:35 AM  Signature:  05/13/2013 9:35 AM  Signature:  Juline Patch, LCSW 05/13/2013 9:35 AM  Signature:  Reyes Ivan, LCSW 05/13/2013 9:35 AM  Signature: Frankey Shown, Care Coordinator 05/13/2013 9:35 AM  Signature:   05/13/2013 9:35 AM  Signature:  05/13/2013  9:35 AM  Signature:   Nestor Ramp, RN 05/13/2013  9:35 AM  Signature:  05/13/2013  9:35 AM    Scribe for Treatment Team:   Juline Patch,  05/13/2013 9:35 AM

## 2013-05-13 NOTE — [facility] Group Notes (Signed)
[facility] LCSW Group Therapy  Feelings Around Relapse 1:15 -2:30        05/13/2013  3:42 PM   Type of Therapy:  Group Therapy  Participation Level: Patient did not attend gorup.  Wynn Banker 05/13/2013 3:42 PM

## 2013-05-13 NOTE — Tx Team (Signed)
Initial Interdisciplinary Treatment Plan  PATIENT STRENGTHS: (choose at least two) General fund of knowledge Religious Affiliation  PATIENT STRESSORS: Health problems Traumatic event   PROBLEM LIST: Problem List/Patient Goals Date to be addressed Date deferred Reason deferred Estimated date of resolution  Depression                                                       DISCHARGE CRITERIA:  Ability to meet basic life and health needs Improved stabilization in mood, thinking, and/or behavior Verbal commitment to aftercare and medication compliance  PRELIMINARY DISCHARGE PLAN: Attend aftercare/continuing care group Outpatient therapy  PATIENT/FAMIILY INVOLVEMENT: This treatment plan has been presented to and reviewed with the patient, Allison Bolton, and/or family member, .  The patient and family have been given the opportunity to ask questions and make suggestions.  Andrena Mews 05/13/2013, 1:30 AM

## 2013-05-13 NOTE — [facility] Suicide Risk Assessment (Signed)
[facility] INPATIENT:  Family/Significant Other Suicide Prevention Education  Suicide Prevention Education:  Education Completed; Joneen Roach, [phone]; has been identified by the patient as the family member/significant other with whom the patient will be residing, and identified as the person(s) who will aid the patient in the event of a mental health crisis (suicidal ideations/suicide attempt).  With written consent from the patient, the family member/significant other has been provided the following suicide prevention education, prior to the and/or following the discharge of the patient.  The suicide prevention education provided includes the following:  Suicide risk factors  Suicide prevention and interventions  National Suicide Hotline telephone number  [facility] assessment telephone number  [facility] Emergency Assistance 911  [facility] and/or Residential Mobile Crisis Unit telephone number  Request made of family/significant other to:  Remove weapons (e.g., guns, rifles, knives), all items previously/currently identified as safety concern.  Uncle advised patient does not have access to weapons.      Remove drugs/medications (over-the-counter, prescriptions, illicit drugs), all items previously/currently identified as a safety concern.  The family member/significant other verbalizes understanding of the suicide prevention education information provided.  The family member/significant other agrees to remove the items of safety concern listed above.  Wynn Banker 05/13/2013, 3:44 PM

## 2013-05-13 NOTE — Progress Notes (Signed)
[facility] Group Notes:  (Nursing/MHT/Case Management/Adjunct)  Date:  05/13/2013  Time:  10:38 PM  Type of Therapy:  Group Therapy  Participation Level:  Active  Participation Quality:  Appropriate  Affect:  Appropriate  Cognitive:  Appropriate  Insight:  Appropriate  Engagement in Group:  Engaged  Modes of Intervention:  Socialization and Support  Summary of Progress/Problems: Pt. Stated her day was "awsome" and rated a 10.  Pt. Stated she enjoyed the group therapy.  Pt. Stated early warning sign of relapse was crying.  Sondra Come 05/13/2013, 10:38 PM

## 2013-05-13 NOTE — [facility] Suicide Risk Assessment (Signed)
Suicide Risk Assessment  Admission Assessment     Nursing information obtained from:    Demographic factors:    Current Mental Status:    Loss Factors:    Historical Factors:    Risk Reduction Factors:     CLINICAL FACTORS:   Severe Anxiety and/or Agitation Bipolar Disorder:   Depressive phase Depression:   Anhedonia Hopelessness Impulsivity Insomnia Recent sense of peace/wellbeing Severe More than one psychiatric diagnosis Unstable or Poor Therapeutic Relationship Previous Psychiatric Diagnoses and Treatments Medical Diagnoses and Treatments/Surgeries  COGNITIVE FEATURES THAT CONTRIBUTE TO RISK:  Closed-mindedness Loss of executive function Polarized thinking Thought constriction (tunnel vision)    SUICIDE RISK:   Moderate:  Frequent suicidal ideation with limited intensity, and duration, some specificity in terms of plans, no associated intent, good self-control, limited dysphoria/symptomatology, some risk factors present, and identifiable protective factors, including available and accessible social support.  PLAN OF CARE: Admitted for Bipolar disorder, depressed and suicidal ideations.   I certify that inpatient services furnished can reasonably be expected to improve the patient's condition.  Cameryn Chrisley,JANARDHAHA R. 05/13/2013, 10:52 AM

## 2013-05-13 NOTE — [facility] Counselor (Signed)
Adult Psychosocial Assessment Update Interdisciplinary Team  Previous [facility] admissions/discharges:  Admissions Discharges  Date: 08/10/12 Date: 08/20/12  Date: Date:  Date: Date:  Date: Date:  Date: Date:   Changes since the last Psychosocial Assessment (including adherence to outpatient mental health and/or substance abuse treatment, situational issues contributing to decompensation and/or relapse). Patient advised of increased depression and suicidal thoughts after being receiving a letter advising her parental rights have been terminated.             Discharge Plan 1. Will you be returning to the same living situation after discharge?   Yes: No:      If no, what is your plan?    Yes, patient will return to her home.  She reports she has good family and church support.       2. Would you like a referral for services when you are discharged? Yes:     If yes, for what services?  No:       Yes, patient is followed by [facility] in [place].       Summary and Recommendations (to be completed by the evaluator) Allison Bolton is a 42 year old Caucasian female admitted with Mood Disorder Nos.  She will benefit from crisis stabilization, evaluation for medication, psycho-education groups for coping skills development, group therapy and case management for discharge planning.                        Signature:  Wynn Banker, 05/13/2013 11:13 AM

## 2013-05-13 NOTE — Progress Notes (Signed)
Adult Psychoeducational Group Note  Date:  05/13/2013 Time:  2:20 PM  Group Topic/Focus:  Early Warning Signs:   The focus of this group is to help patients identify signs or symptoms they exhibit before slipping into an unhealthy state or crisis.  Participation Level:  Active  Participation Quality:  Appropriate and Attentive  Affect:  Appropriate  Cognitive:  Alert and Appropriate  Insight: Good  Engagement in Group:  Engaged  Modes of Intervention:  Activity, Discussion, Exploration, Socialization and Support  Additional Comments:  Pt came to group and shared that sadness and neglecting things she used to care about are two of her early warning signs for relapse. Pt was given a handout with coping skills to deal with relapse prevention.  Cathlean Cower 05/13/2013, 2:20 PM

## 2013-05-13 NOTE — [facility] Group Notes (Signed)
[facility] LCSW Aftercare Discharge Planning Group Note   05/13/2013 1:00 PM    Participation Quality:  Appropraite  Mood/Affect:  Appropriate  Depression Rating:  3  Anxiety Rating:  3  Thoughts of Suicide:  No  Will you contract for safety?   NA  Current AVH:  No  Plan for Discharge/Comments:  Patient attending discharge planning group and actively participated in group.  Patient advised of being followed by [facility] for outpatient services.  CSW provided all participants with daily workbook.   Transportation Means: Patient has transportation.   Supports:  Patient has a support system.   Breton Berns, Joesph July

## 2013-05-13 NOTE — Progress Notes (Signed)
D:Pt rates her depression as a 7 on 1-10 scale with 10 being the most depressed. Pt is focused on wanting to see her daughter. She reports that she did not sleep well and that she takes a higher dose of Trazodone at home that she was given last night. Pt's affect brightens upon approach. A:Offered support, encouragement and 15 minute checks. R:Pt denies si and hi. Safety maintained on the unit.

## 2013-05-14 DIAGNOSIS — F411 Generalized anxiety disorder: Secondary | ICD-10-CM

## 2013-05-14 LAB — LAMOTRIGINE LEVEL: Lamotrigine Lvl: 3.5 ug/mL (ref 3.0–14.0)

## 2013-05-14 MED ORDER — LAMOTRIGINE 200 MG PO TABS
200.0000 mg | ORAL_TABLET | Freq: Every day | ORAL | Status: DC
  Administered 2013-05-14 – 2013-05-15 (×2): 200 mg via ORAL
  Filled 2013-05-14 (×4): qty 1
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
## 2013-05-14 NOTE — Progress Notes (Signed)
Writer has observed patient at medication window several times. Patient reports taking lamictal as one of her home meds and is concerned as to why she is not receiving it here. Writer encouraged patient to speak with her doctor to make sure this is not an oversight. Patient is receptive, Clinical research associate will place sticky note for doctor. Patient has been up in the dayroom interacting appropriately with peers. Patients affect is sad and she appears anxious and requested prn of visteril, writer informed patient it is too soon before next dose and encouraged her to use coping skills such as distraction. Safety maintained on unit with 15 min checks.

## 2013-05-14 NOTE — Progress Notes (Signed)
Psychoeducational Group Note  Date: 05/14/2013 Time:  1015  Group Topic/Focus:  Identifying Needs:   The focus of this group is to help patients identify their personal needs that have been historically problematic and identify healthy behaviors to address their needs.  Participation Level:  Active  Participation Quality:  Appropriate  Affect:  Appropriate  Cognitive:  Oriented  Insight:  Improving  Engagement in Group:  Engaged  Additional Comments:  Participated in the group.   Anaeli Cornwall A 

## 2013-05-14 NOTE — Clinical Social Work Note (Signed)
Clinical social work note  At patient request, met with her individually after group.  She was concerned about how long she would be in the hospital.  Informed her that there are no plans for her to discharge this weekend, but she will be seeing her doctor and social worker on Monday, when it may be considered if she continues to show improvements in mood and participate in groups as she did today.  She talked about how she was hospitalized after getting upset over parental rights termination papers being served on her.  She seems almost accepting of that today.  She was concerned that her attorney regarding her parental rights not know that she is in the hospital, and we discussed how she can call him but not leave a message with a phone number if he is not able to take her call, but rather just say she will call back.  Ambrose Mantle, LCSW 05/14/2013, 5:12 PM

## 2013-05-14 NOTE — Progress Notes (Signed)
Adult Psychoeducational Group Note  Date:  05/14/2013 Time:  9:18 PM  Group Topic/Focus:  Wrap-Up Group:   The focus of this group is to help patients review their daily goal of treatment and discuss progress on daily workbooks.  Participation Level:  Active  Participation Quality:  Attentive and Sharing  Affect:  Appropriate  Cognitive:  Appropriate  Insight: Good and Improving  Engagement in Group:  Engaged  Modes of Intervention:  Clarification, Confrontation and Exploration  Additional Comments:    Lorin Mercy 05/14/2013, 9:18 PM

## 2013-05-14 NOTE — [facility] Group Notes (Signed)
[facility] Group Notes:  (Clinical Social Work)  05/14/2013   3:00-4:00PM  Summary of Progress/Problems:   The main focus of today's process group was for the patient to identify ways in which they have sabotaged their own mental health wellness/recovery.  Motivational interviewing was used to explore the reasons they engage in this behavior, and reasons they may have for wanting to change.  The Stages of Change were explained to the group using a handout, and patients identified where they are with regard to changing self-defeating behaviors.  The patient expressed that she self sabotages by being a people pleaser because she does not want people mad at her.  She ends up spending money on other people as a result and not having enough for her own needs.  She feels she is in Preparation Stage of Change.  Type of Therapy:  Process Group  Participation Level:  Active  Participation Quality:  Attentive and Sharing  Affect:  Appropriate  Cognitive:  Appropriate and Oriented  Insight:  Developing/Improving  Engagement in Therapy:  Engaged  Modes of Intervention:  Education, Motivational Interviewing   Ambrose Mantle, LCSW 05/14/2013, 4:00pm

## 2013-05-15 MED ORDER — GUAIFENESIN ER 600 MG PO TB12
600.0000 mg | ORAL_TABLET | Freq: Two times a day (BID) | ORAL | Status: DC
  Administered 2013-05-15 – 2013-05-16 (×2): 600 mg via ORAL
  Filled 2013-05-15 (×6): qty 1

## 2013-05-15 NOTE — Progress Notes (Signed)
Adult Psychoeducational Group Note  Date:  05/15/2013 Time:  3:52 PM  Group Topic/Focus:  Therapeutic Activity  Participation Level:  Active  Participation Quality:  Appropriate  Affect:  Appropriate  Cognitive:  Appropriate  Insight: Appropriate  Engagement in Group:  Engaged  Modes of Intervention:  Activity  Additional Comments:  Pts played "Signs of Stress Bingo". Each pt was given a card with different signs and symptoms of stress on them (Loss of appetite, weight loss or gain etc) . Wining pts where rewarded with a piece of candy.   Kaylynn Chamblin N 05/15/2013, 3:52 PM 

## 2013-05-15 NOTE — Progress Notes (Signed)
Patient has been up and active on the unit, attended group this evening and has voiced no complaints. Patient looking forward to discharge on tomorrow. Patient currently denies having pain, -si/hi/a/v hall. Support and encouragement offered, safety maintained on unit, will continue to monitor.  

## 2013-05-15 NOTE — Progress Notes (Signed)
Writer has observed patient up in the dayroom and on the phone this evening. Writer spoke with patient 1:1 and she reports having had a good day. She is glad that her lamictal was ordered and started today. Patient reports that she has signed a 72 hour request for discharge because she was told that she would probably be here until Tuesday and she reports that she needs to keep in contact with her attorney concerning her custody case concerning her daughter. Patient reports that it would not look good for her case if the attorney found out she was here in the hospital. Patient reports that is doing and feeling much better. Patient denies si/hi/a/v hallucinations. Support and encouragement offered, safety maintained with 15 min checks.

## 2013-05-15 NOTE — Progress Notes (Deleted)
[name] is UAL on the 500 hall today. She is focused on going home on T

## 2013-05-15 NOTE — Progress Notes (Addendum)
Allison Bolton is OOB UAL on the 500 hall tolerated fair. SHe is less anxious and  more relaxed, but quite fixated on when ( exactly what date) she will be DC'Allison  This week.   A SHe takes her meds as scheduled and denies adverse side effects. She completes her AM self inventory and on it she writes she denies SI within the past 24 hrs, she rates her depression and hopelessness  "1/1" and says her DC plan is to cont " counseling and a support group".   R Safety is in place and poc moves forward. 1800 Pt cont to request medicine for cough and stopped up head. She is worried that pneumopnia ( she says she had 2 months ago) is back. This RN called Dr. Shela Commons and pt was given muccinex per his order.

## 2013-05-15 NOTE — [facility] Group Notes (Signed)
[facility] Group Notes:  (Clinical Social Work)  05/15/2013   1:15-2:20PM  Summary of Progress/Problems:   The main focus of today's process group was to   identify the patient's current support system and decide on other supports that can be put in place.  The picture on workbook was used to discuss why additional supports are needed, then used to talk about how patients have given and received all different kinds of support.  An emphasis was placed on using counselor, doctor, therapy groups, 12-step groups, and problem-specific support groups to expand supports.  The patient expressed full comprehension of the concepts presented, and agreed that there is a need to add more supports.  The patient stated the current supports in place are her [facility], her family, and current crisis group through [facility].  She wants to add support groups at [facility] additionally.  Type of Therapy:  Process Group  Participation Level:  Active  Participation Quality:  Attentive and Sharing  Affect:  Anxious  Cognitive:  Oriented  Insight:  Engaged  Engagement in Therapy:  Engaged  Modes of Intervention:  Education,  Support and [employer], LCSW 05/15/2013, 4:00pm
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
## 2013-05-15 NOTE — Progress Notes (Signed)
Adult Psychoeducational Group Note  Date:  05/15/2013 Time:  8:00pm  Group Topic/Focus:  Wrap-Up Group:   The focus of this group is to help patients review their daily goal of treatment and discuss progress on daily workbooks.  Participation Level:  Active  Participation Quality:  Appropriate and Attentive  Affect:  Appropriate  Cognitive:  Alert and Appropriate  Insight: Appropriate  Engagement in Group:  Engaged  Modes of Intervention:  Discussion and Education  Additional Comments:   Wrap up group we discussed 15 minute checks,enviromental checks, falls precautions and each patient checked in on how their day went. Pt stated she had a good day enjoyed groups.  Shelly Bombard D 05/15/2013, 10:28 PM

## 2013-05-15 NOTE — Progress Notes (Signed)
Psychoeducational Group Note  Date:  05/15/2013 Time:  1015  Group Topic/Focus:  Making Healthy Choices:   The focus of this group is to help patients identify negative/unhealthy choices they were using prior to admission and identify positive/healthier coping strategies to replace them upon discharge.  Participation Level:  Active  Participation Quality:  Appropriate  Affect:  Appropriate  Cognitive:  Oriented  Insight:  Improving  Engagement in Group:  Engaged  Additional Comments:    Charday Capetillo A 05/15/2013 

## 2013-05-16 MED ORDER — BUSPIRONE HCL 10 MG PO TABS: 10.0000 mg | ORAL_TABLET | Freq: Two times a day (BID) | ORAL | Status: DC

## 2013-05-16 MED ORDER — GUAIFENESIN ER 600 MG PO TB12: 600.0000 mg | ORAL_TABLET | Freq: Two times a day (BID) | ORAL | Status: DC

## 2013-05-16 MED ORDER — RISPERIDONE 2 MG PO TABS: 2.0000 mg | ORAL_TABLET | Freq: Every day | ORAL | Status: DC

## 2013-05-16 MED ORDER — TRAZODONE HCL 100 MG PO TABS: 200.0000 mg | ORAL_TABLET | Freq: Every day | ORAL | Status: DC

## 2013-05-16 MED ORDER — OXYBUTYNIN CHLORIDE ER 5 MG PO TB24: 5.0000 mg | ORAL_TABLET | Freq: Every day | ORAL | Status: DC

## 2013-05-16 MED ORDER — HYDROXYZINE HCL 25 MG PO TABS: 25.0000 mg | ORAL_TABLET | Freq: Four times a day (QID) | ORAL | Status: DC | PRN

## 2013-05-16 MED ORDER — LAMOTRIGINE 200 MG PO TABS: 200.0000 mg | ORAL_TABLET | Freq: Every day | ORAL | Status: DC

## 2013-05-16 NOTE — Progress Notes (Signed)
Recreation Therapy Notes  Date: 11.10.2014 Time: 3:00pm Location: 500 Hall Dayroom  Group Topic: Communication, Team Building, Problem Solving  Goal Area(s) Addresses:  Patient will effectively work with peer towards shared goal.  Patient will identify skill used to make activity successful.  Patient will identify how skills used during activity can be used to reach post d/c goals.   Behavioral Response: Did not attend.   Marykay Lex Teana Lindahl, LRT/CTRS  Jearl Klinefelter 05/16/2013 3:44 PM

## 2013-05-16 NOTE — Progress Notes (Signed)
[facility] Adult Case Management Discharge Plan :  Will you be returning to the same living situation after discharge: Yes,  Patient to return to her home. At discharge, do you have transportation home?:Yes,  Patient to be transported by her ACT Team. Do you have the ability to pay for your medications:Yes,  Patient has Medicaid.  Release of information consent forms completed and in the chart;  Patient's signature needed at discharge.  Patient to Follow up at: Follow-up Information   Follow up with [facility] On 05/19/2013. (Thursday, May 19, 2013 at 7:45.  Referral # [id number].)    Contact information:   [address] [place], [place]  [address]  [phone]      Patient denies SI/HI:  Patient no longer endorsing SI/HI or other thoughts of self harm.   Safety Planning and Suicide Prevention discussed:  .Reviewed with all patients during discharge planning group   Raul Winterhalter Hairston 05/16/2013, 10:50 AM

## 2013-05-16 NOTE — BHH Group Notes (Signed)
[facility] LCSW Aftercare Discharge Planning Group Note   05/16/2013 10:33 AM    Participation Quality:  Appropraite  Mood/Affect:  Appropriate  Depression Rating:  0  Anxiety Rating:  0  Thoughts of Suicide:  No  Will you contract for safety?   NA  Current AVH:  No  Plan for Discharge/Comments:  Patient attending discharge planning group and actively participated in group.  Patient will follow up with [facility] for outpatient services.  CSW provided all participants with daily workbook.   Transportation Means: Patient has transportation.   Supports:  Patient has a support system.   Mekia Dipinto, Joesph July

## 2013-05-16 NOTE — Progress Notes (Signed)
Patient ID: Allison Bolton, female   DOB: 1971/06/01, 42 y.o.   MRN: [id number] Patient was discharged ambulatory and transportation provided by her ACT Team.  She denies SI/HI.  She verbalizes understanding of her discharge meds and followup.  Patient was given scripts.  She says she feels ready for discharge and learned a lot while here.

## 2013-05-16 NOTE — Tx Team (Signed)
Interdisciplinary Treatment Plan Update   Date Reviewed:  05/16/2013  Time Reviewed:  9:32 AM  Progress in Treatment:   Attending groups: Yes Participating in groups: Yes Taking medication as prescribed: Yes  Tolerating medication: Yes Family/Significant other contact made:Yes, collateral contact made with uncle Patient understands diagnosis: Yes  Discussing patient identified problems/goals with staff: Yes Medical problems stabilized or resolved: Yes Denies suicidal/homicidal ideation: Yes Patient has not harmed self or others: Yes  For review of initial/current patient goals, please see plan of care.  Estimated Length of Stay:  Discharge home today  Reasons for Continued Hospitalization:  Anxiety Depression Medication stabilization   New Problems/Goals identified:    Discharge Plan or Barriers:   [name]  Additional Comments:  N/A  Attendees:  Patient:  05/16/2013 9:32 AM   Signature: Mervyn Gay, MD 05/16/2013 9:32 AM  Signature:  Verne Spurr, PA 05/16/2013 9:32 AM  Signature:  Neill Loft, RN 05/16/2013 9:32 AM  Signature:  Quintella Reichert, RN 05/16/2013 9:32 AM  Signature:  05/16/2013 9:32 AM  Signature:  Juline Patch, LCSW 05/16/2013 9:32 AM  Signature:  Reyes Ivan, LCSW 05/16/2013 9:32 AM  Signature: Frankey Shown, Care Coordinator 05/16/2013 9:32 AM  Signature:   05/16/2013 9:32 AM  Signature:  05/16/2013  9:32 AM  Signature:  Marcell Barlow, RN 05/16/2013  9:32 AM  Signature:  05/16/2013  9:32 AM    Scribe for Treatment Team:   Juline Patch,  05/16/2013 9:32 AM

## 2013-05-16 NOTE — Progress Notes (Signed)
Chaplain consulted with pt after 500 hall grief and loss group.  Allison Bolton shared fears around her children's involvement with DSS.  Allison Bolton is supposed to speak with her lawyer at 2:00pm today.  Chaplain provided emotional and spiritual support around potential loss of daughter, feeling of inability to control circumstances.   Belva Crome MDiv

## 2013-05-16 NOTE — BHH Suicide Risk Assessment (Signed)
Suicide Risk Assessment  Discharge Assessment     Demographic Factors:  Adolescent or young adult, Caucasian, Low socioeconomic status and Living alone  Mental Status Per Nursing Assessment::   On Admission:     Current Mental Status by Physician: Patient is calm, quiet and cooperative. Patient has not been psychomotor activity. Patient stated mood his depression and affect was appropriate and bright. Patient has normal rate and volume of speech. Patient has no suicidal/homicidal ideation intention or plan. Patient has no evidence of psychotic symptoms. Patient has fair insight judgment and impulse control.  Loss Factors: Financial problems/change in socioeconomic status  Historical Factors: Prior suicide attempts and Impulsivity  Risk Reduction Factors:   Sense of responsibility to family, Religious beliefs about death, Positive social support, Positive therapeutic relationship and Positive coping skills or problem solving skills  Continued Clinical Symptoms:  Severe Anxiety and/or Agitation Bipolar Disorder:   Depressive phase Previous Psychiatric Diagnoses and Treatments  Cognitive Features That Contribute To Risk:  Polarized thinking    Suicide Risk:  Minimal: No identifiable suicidal ideation.  Patients presenting with no risk factors but with morbid ruminations; may be classified as minimal risk based on the severity of the depressive symptoms  Discharge Diagnoses:   AXIS I:  Bipolar, Depressed and Generalized Anxiety Disorder AXIS II:  Deferred AXIS III:   Past Medical History  Diagnosis Date  . Asthma   . Bipolar 1 disorder   . Diverticulosis   . Arthritis   . S/P colonoscopy     Dr. Karilyn Cota 2010: few small diverticula at sigmoid. otherwise normal.   . Dysrhythmia     sts "I have heart palpitations"  . Shortness of breath   . Meniere's disease   . H/O hiatal hernia     4  . Headache(784.0)   . Fibromyalgia    AXIS IV:  other psychosocial or environmental  problems, problems related to social environment and problems with primary support group AXIS V:  61-70 mild symptoms  Plan Of Care/Follow-up recommendations:  Activity:  As tolerated Diet:  Regular  Is patient on multiple antipsychotic therapies at discharge:  No   Has Patient had three or more failed trials of antipsychotic monotherapy by history:  No  Recommended Plan for Multiple Antipsychotic Therapies: NA  Nehemiah Settle., M.D. 05/16/2013, 1:26 PM
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
## 2013-05-20 NOTE — Progress Notes (Signed)
Patient Discharge Instructions:  After Visit Summary (AVS):   Faxed to:  05/20/13 Discharge Summary Note:   Faxed to:  05/20/13 Psychiatric Admission Assessment Note:   Faxed to:  05/20/13 Suicide Risk Assessment - Discharge Assessment:   Faxed to:  05/20/13 Faxed/Sent to the Next Level Care provider:  05/20/13 Faxed to [facility] @ [phone]  Jerelene Redden, 05/20/2013, 2:57 PM

## 2013-06-03 DIAGNOSIS — G8929 Other chronic pain: Secondary | ICD-10-CM | POA: Diagnosis not present

## 2013-06-03 DIAGNOSIS — F411 Generalized anxiety disorder: Secondary | ICD-10-CM | POA: Diagnosis not present

## 2013-06-03 DIAGNOSIS — J45909 Unspecified asthma, uncomplicated: Secondary | ICD-10-CM | POA: Diagnosis not present

## 2013-06-03 DIAGNOSIS — M25569 Pain in unspecified knee: Secondary | ICD-10-CM | POA: Diagnosis not present

## 2013-06-03 DIAGNOSIS — Z79899 Other long term (current) drug therapy: Secondary | ICD-10-CM | POA: Diagnosis not present

## 2013-06-03 DIAGNOSIS — F319 Bipolar disorder, unspecified: Secondary | ICD-10-CM | POA: Diagnosis not present

## 2013-06-03 DIAGNOSIS — M129 Arthropathy, unspecified: Secondary | ICD-10-CM | POA: Diagnosis not present

## 2013-06-06 DIAGNOSIS — M6750 Plica syndrome, unspecified knee: Secondary | ICD-10-CM | POA: Diagnosis not present

## 2013-07-04 DIAGNOSIS — M2390 Unspecified internal derangement of unspecified knee: Secondary | ICD-10-CM | POA: Diagnosis not present

## 2013-07-08 DIAGNOSIS — M1710 Unilateral primary osteoarthritis, unspecified knee: Secondary | ICD-10-CM | POA: Diagnosis not present

## 2013-07-08 DIAGNOSIS — M2390 Unspecified internal derangement of unspecified knee: Secondary | ICD-10-CM | POA: Diagnosis not present

## 2013-07-08 DIAGNOSIS — M2240 Chondromalacia patellae, unspecified knee: Secondary | ICD-10-CM | POA: Diagnosis not present

## 2013-07-08 DIAGNOSIS — IMO0002 Reserved for concepts with insufficient information to code with codable children: Secondary | ICD-10-CM | POA: Diagnosis not present

## 2013-07-15 DIAGNOSIS — M2240 Chondromalacia patellae, unspecified knee: Secondary | ICD-10-CM | POA: Diagnosis not present

## 2013-07-18 ENCOUNTER — Encounter: Payer: Self-pay | Admitting: Cardiology

## 2013-07-18 ENCOUNTER — Ambulatory Visit (INDEPENDENT_AMBULATORY_CARE_PROVIDER_SITE_OTHER): Payer: Medicare Other | Admitting: Cardiology

## 2013-07-18 VITALS — BP 124/84 | HR 76 | Ht 66.0 in | Wt 189.0 lb

## 2013-07-18 DIAGNOSIS — Z139 Encounter for screening, unspecified: Secondary | ICD-10-CM

## 2013-07-18 DIAGNOSIS — R079 Chest pain, unspecified: Secondary | ICD-10-CM | POA: Diagnosis not present

## 2013-07-18 DIAGNOSIS — F17200 Nicotine dependence, unspecified, uncomplicated: Secondary | ICD-10-CM | POA: Diagnosis not present

## 2013-07-18 NOTE — Patient Instructions (Signed)
Your physician recommends that you schedule a follow-up appointment in: As Needed   Your physician recommends that you continue on your current medications as directed. Please refer to the Current Medication list given to you today.     

## 2013-07-18 NOTE — Progress Notes (Signed)
Clinical Summary Allison Bolton is a 43 y.o.female last seen by Dr Aundra Dubin, this is our first visit together. She was seen for the following medical problems.  1. Chest pain - described at prior visits with Dr Aundra Dubin. She has a strong family history of early CAD - prior submaximal ETT myoview 04/2012 showed no evidence of ischemia (79% of THR) - echo showed normal LVEF - denies any recent chest pain. No SOB or DOE. Swims regularly 3-4 times a week without any troubles.  From CAD risk factor standpoint she had a recent lipid panel 05/2013: TC 154 HDL 37 LDL 88 TG 146 +tobacco 20 years, 1 ppd. Precontemplative, she is not ready to quit.   Past Medical History  Diagnosis Date  . Asthma   . Bipolar 1 disorder   . Diverticulosis   . Arthritis   . S/P colonoscopy     Dr. Laural Golden 2010: few small diverticula at sigmoid. otherwise normal.   . Dysrhythmia     sts "I have heart palpitations"  . Shortness of breath   . Meniere's disease   . H/O hiatal hernia     4  . Headache(784.0)   . Fibromyalgia      Allergies  Allergen Reactions  . Amoxicillin Other (See Comments)    Patient states she feels sunburnt when taking amoxicillin     Current Outpatient Prescriptions  Medication Sig Dispense Refill  . busPIRone (BUSPAR) 10 MG tablet Take 1 tablet (10 mg total) by mouth 2 (two) times daily.  60 tablet  0  . guaiFENesin (MUCINEX) 600 MG 12 hr tablet Take 1 tablet (600 mg total) by mouth 2 (two) times daily.  11 tablet  0  . hydrOXYzine (ATARAX/VISTARIL) 25 MG tablet Take 1 tablet (25 mg total) by mouth every 6 (six) hours as needed for anxiety.  30 tablet  0  . ibuprofen (ADVIL,MOTRIN) 200 MG tablet Take 400 mg by mouth every 6 (six) hours as needed for headache, mild pain or moderate pain.      Marland Kitchen lamoTRIgine (LAMICTAL) 200 MG tablet Take 1 tablet (200 mg total) by mouth at bedtime.  30 tablet  0  . oxybutynin (DITROPAN-XL) 5 MG 24 hr tablet Take 1 tablet (5 mg total) by mouth at  bedtime.  30 tablet  0  . polyethylene glycol powder (GLYCOLAX/MIRALAX) powder 17 g daily.  255 g  0  . risperiDONE (RISPERDAL) 2 MG tablet Take 1 tablet (2 mg total) by mouth at bedtime.  30 tablet  0  . traZODone (DESYREL) 100 MG tablet Take 2 tablets (200 mg total) by mouth at bedtime.  60 tablet  0  . [DISCONTINUED] albuterol (PROVENTIL) (2.5 MG/3ML) 0.083% nebulizer solution Take 2.5 mg by nebulization every 6 (six) hours as needed. For wheezing, allergies, and/or shortness of breath      . [DISCONTINUED] estrogens, conjugated, (PREMARIN) 0.625 MG tablet Take 0.625 mg by mouth daily.       . [DISCONTINUED] omeprazole (PRILOSEC) 20 MG capsule Take 20 mg by mouth every morning.        No current facility-administered medications for this visit.     Past Surgical History  Procedure Laterality Date  . Tonsillectomy and adenoidectomy    . Umbilical hernia repair  Dec 2011    Dr. Arnoldo Morale  . Abdominal hysterectomy    . Hemorrhoid surgery    . Cholecystectomy    . Foot surgery    . Left ovarian removal    .  Cesarean section    . Right ovarian removal    . Exploratory laparoscopy    . Wisdom teeth removal    . Multiple hernia repairs    . Tubes in ears    . Tonsillectomy    . Laparoscopy  02/19/2011    Procedure: LAPAROSCOPY DIAGNOSTIC;  Surgeon: Jamesetta So;  Location: [facility];  Service: General;  Laterality: N/A;  . Laparoscopic appendectomy  02/19/2011    Procedure: APPENDECTOMY LAPAROSCOPIC;  Surgeon: Jamesetta So;  Location: [facility];  Service: General;  Laterality: N/A;  . Appendectomy       Allergies  Allergen Reactions  . Amoxicillin Other (See Comments)    Patient states she feels sunburnt when taking amoxicillin      Family History  Problem Relation Age of Onset  . Thyroid disease Mother   . Colon cancer      paternal and maternal grandfather  . Anesthesia problems Neg Hx   . Hypotension Neg Hx   . Malignant hyperthermia Neg Hx   . Pseudochol deficiency Neg  Hx   . Meniere's disease Other   . Colon cancer Father   . Colon cancer Brother      Social History Ms. Cadet reports that she has been smoking Cigarettes.  She started smoking about 32 years ago. She has a 10 pack-year smoking history. She has never used smokeless tobacco. Ms. Farias reports that she does not drink alcohol.   Review of Systems CONSTITUTIONAL: No weight loss, fever, chills, weakness or fatigue.  HEENT: Eyes: No visual loss, blurred vision, double vision or yellow sclerae.No hearing loss, sneezing, congestion, runny nose or sore throat.  SKIN: No rash or itching.  CARDIOVASCULAR: per HPI RESPIRATORY: No shortness of breath, cough or sputum.  GASTROINTESTINAL: No anorexia, nausea, vomiting or diarrhea. No abdominal pain or blood.  GENITOURINARY: No burning on urination, no polyuria NEUROLOGICAL: No headache, dizziness, syncope, paralysis, ataxia, numbness or tingling in the extremities. No change in bowel or bladder control.  MUSCULOSKELETAL: No muscle, back pain, joint pain or stiffness.  LYMPHATICS: No enlarged nodes. No history of splenectomy.  PSYCHIATRIC: No history of depression or anxiety.  ENDOCRINOLOGIC: No reports of sweating, cold or heat intolerance. No polyuria or polydipsia.  Marland Kitchen   Physical Examination p 76 bp 124/84 Wt 189 lbs BMI 31 Gen: resting comfortably, no acute distress HEENT: no scleral icterus, pupils equal round and reactive, no palptable cervical adenopathy,  CV: RRR, no m/r/g, no JVD, no carotid bruits Resp: Clear to auscultation bilaterally GI: abdomen is soft, non-tender, non-distended, normal bowel sounds, no hepatosplenomegaly MSK: extremities are warm, no edema.  Skin: warm, no rash Neuro:  no focal deficits Psych: appropriate affect   Diagnostic Studies 04/2012 Echo LVEF 65%, no WMAs,    04/2012 ETT myoview 7 METs, 79% THR, no ischemia  07/18/13 Clinic EKG Normal sinus rhythm  Assessment and Plan  1. Chest pain -  no recent symptoms, prior workup has been unremarkable - she does have strong family history and needs continued risk factor modification. She has normal blood pressure and lipid panel, she is precontemplative and not ready to quit tobacco. Counseled on importance of cessation, especially given her strong family history of CAD  - patient may follow up as needed in cardiology clinic, further risk factor management may be handled by her PCP. Please recontact [place] if symptoms change.       Arnoldo Lenis, M.D., F.A.C.C.

## 2013-08-05 DIAGNOSIS — R5383 Other fatigue: Secondary | ICD-10-CM | POA: Diagnosis not present

## 2013-08-05 DIAGNOSIS — R5381 Other malaise: Secondary | ICD-10-CM | POA: Diagnosis not present

## 2013-08-30 DIAGNOSIS — IMO0002 Reserved for concepts with insufficient information to code with codable children: Secondary | ICD-10-CM | POA: Diagnosis not present

## 2013-08-30 DIAGNOSIS — M1710 Unilateral primary osteoarthritis, unspecified knee: Secondary | ICD-10-CM | POA: Diagnosis not present

## 2013-08-31 DIAGNOSIS — N200 Calculus of kidney: Secondary | ICD-10-CM | POA: Diagnosis not present

## 2013-08-31 DIAGNOSIS — K5900 Constipation, unspecified: Secondary | ICD-10-CM | POA: Diagnosis not present

## 2013-08-31 DIAGNOSIS — R3915 Urgency of urination: Secondary | ICD-10-CM | POA: Diagnosis not present

## 2013-09-06 DIAGNOSIS — B3784 Candidal otitis externa: Secondary | ICD-10-CM | POA: Diagnosis not present

## 2013-09-06 DIAGNOSIS — IMO0002 Reserved for concepts with insufficient information to code with codable children: Secondary | ICD-10-CM | POA: Diagnosis not present

## 2013-09-06 DIAGNOSIS — Z0000 Encounter for general adult medical examination without abnormal findings: Secondary | ICD-10-CM | POA: Diagnosis not present

## 2013-09-06 DIAGNOSIS — M1710 Unilateral primary osteoarthritis, unspecified knee: Secondary | ICD-10-CM | POA: Diagnosis not present

## 2013-09-26 DIAGNOSIS — H65199 Other acute nonsuppurative otitis media, unspecified ear: Secondary | ICD-10-CM | POA: Diagnosis not present

## 2013-11-07 DIAGNOSIS — M549 Dorsalgia, unspecified: Secondary | ICD-10-CM | POA: Diagnosis not present

## 2013-11-15 DIAGNOSIS — IMO0002 Reserved for concepts with insufficient information to code with codable children: Secondary | ICD-10-CM | POA: Diagnosis not present

## 2013-11-15 DIAGNOSIS — F319 Bipolar disorder, unspecified: Secondary | ICD-10-CM | POA: Diagnosis not present

## 2013-11-15 DIAGNOSIS — F17200 Nicotine dependence, unspecified, uncomplicated: Secondary | ICD-10-CM | POA: Diagnosis not present

## 2013-11-15 DIAGNOSIS — J45909 Unspecified asthma, uncomplicated: Secondary | ICD-10-CM | POA: Diagnosis not present

## 2013-11-15 DIAGNOSIS — IMO0001 Reserved for inherently not codable concepts without codable children: Secondary | ICD-10-CM | POA: Diagnosis not present

## 2013-11-15 DIAGNOSIS — N760 Acute vaginitis: Secondary | ICD-10-CM | POA: Diagnosis not present

## 2013-11-15 DIAGNOSIS — F410 Panic disorder [episodic paroxysmal anxiety] without agoraphobia: Secondary | ICD-10-CM | POA: Diagnosis not present

## 2013-11-15 DIAGNOSIS — B9689 Other specified bacterial agents as the cause of diseases classified elsewhere: Secondary | ICD-10-CM | POA: Diagnosis not present

## 2013-11-15 DIAGNOSIS — Z79899 Other long term (current) drug therapy: Secondary | ICD-10-CM | POA: Diagnosis not present

## 2013-11-15 DIAGNOSIS — F411 Generalized anxiety disorder: Secondary | ICD-10-CM | POA: Diagnosis not present

## 2013-11-15 DIAGNOSIS — A499 Bacterial infection, unspecified: Secondary | ICD-10-CM | POA: Diagnosis not present

## 2013-12-11 DIAGNOSIS — Q438 Other specified congenital malformations of intestine: Secondary | ICD-10-CM | POA: Diagnosis not present

## 2013-12-11 DIAGNOSIS — F319 Bipolar disorder, unspecified: Secondary | ICD-10-CM | POA: Diagnosis not present

## 2013-12-11 DIAGNOSIS — J45909 Unspecified asthma, uncomplicated: Secondary | ICD-10-CM | POA: Diagnosis not present

## 2013-12-11 DIAGNOSIS — K659 Peritonitis, unspecified: Secondary | ICD-10-CM | POA: Diagnosis not present

## 2013-12-11 DIAGNOSIS — K5289 Other specified noninfective gastroenteritis and colitis: Secondary | ICD-10-CM | POA: Diagnosis not present

## 2013-12-11 DIAGNOSIS — Z79899 Other long term (current) drug therapy: Secondary | ICD-10-CM | POA: Diagnosis not present

## 2013-12-11 DIAGNOSIS — F17200 Nicotine dependence, unspecified, uncomplicated: Secondary | ICD-10-CM | POA: Diagnosis not present

## 2013-12-11 DIAGNOSIS — N200 Calculus of kidney: Secondary | ICD-10-CM | POA: Diagnosis not present

## 2013-12-14 DIAGNOSIS — Z8041 Family history of malignant neoplasm of ovary: Secondary | ICD-10-CM | POA: Diagnosis not present

## 2013-12-14 DIAGNOSIS — Z8249 Family history of ischemic heart disease and other diseases of the circulatory system: Secondary | ICD-10-CM | POA: Diagnosis not present

## 2013-12-14 DIAGNOSIS — Z881 Allergy status to other antibiotic agents status: Secondary | ICD-10-CM | POA: Diagnosis not present

## 2013-12-14 DIAGNOSIS — Z8489 Family history of other specified conditions: Secondary | ICD-10-CM | POA: Diagnosis not present

## 2013-12-14 DIAGNOSIS — N390 Urinary tract infection, site not specified: Secondary | ICD-10-CM | POA: Diagnosis not present

## 2013-12-14 DIAGNOSIS — F319 Bipolar disorder, unspecified: Secondary | ICD-10-CM | POA: Diagnosis not present

## 2013-12-14 DIAGNOSIS — K5289 Other specified noninfective gastroenteritis and colitis: Secondary | ICD-10-CM | POA: Diagnosis not present

## 2013-12-14 DIAGNOSIS — M545 Low back pain, unspecified: Secondary | ICD-10-CM | POA: Diagnosis not present

## 2013-12-14 DIAGNOSIS — K589 Irritable bowel syndrome without diarrhea: Secondary | ICD-10-CM | POA: Diagnosis not present

## 2013-12-14 DIAGNOSIS — M4120 Other idiopathic scoliosis, site unspecified: Secondary | ICD-10-CM | POA: Diagnosis not present

## 2013-12-14 DIAGNOSIS — G43909 Migraine, unspecified, not intractable, without status migrainosus: Secondary | ICD-10-CM | POA: Diagnosis not present

## 2013-12-14 DIAGNOSIS — R1012 Left upper quadrant pain: Secondary | ICD-10-CM | POA: Diagnosis not present

## 2013-12-14 DIAGNOSIS — R12 Heartburn: Secondary | ICD-10-CM | POA: Diagnosis not present

## 2013-12-14 DIAGNOSIS — K5730 Diverticulosis of large intestine without perforation or abscess without bleeding: Secondary | ICD-10-CM | POA: Diagnosis not present

## 2013-12-14 DIAGNOSIS — Z79899 Other long term (current) drug therapy: Secondary | ICD-10-CM | POA: Diagnosis not present

## 2013-12-14 DIAGNOSIS — J45909 Unspecified asthma, uncomplicated: Secondary | ICD-10-CM | POA: Diagnosis not present

## 2013-12-14 DIAGNOSIS — F410 Panic disorder [episodic paroxysmal anxiety] without agoraphobia: Secondary | ICD-10-CM | POA: Diagnosis not present

## 2013-12-14 DIAGNOSIS — F17200 Nicotine dependence, unspecified, uncomplicated: Secondary | ICD-10-CM | POA: Diagnosis not present

## 2013-12-14 DIAGNOSIS — M129 Arthropathy, unspecified: Secondary | ICD-10-CM | POA: Diagnosis not present

## 2013-12-14 DIAGNOSIS — K449 Diaphragmatic hernia without obstruction or gangrene: Secondary | ICD-10-CM | POA: Diagnosis not present

## 2013-12-14 DIAGNOSIS — G8929 Other chronic pain: Secondary | ICD-10-CM | POA: Diagnosis not present

## 2013-12-14 DIAGNOSIS — R109 Unspecified abdominal pain: Secondary | ICD-10-CM | POA: Diagnosis not present

## 2013-12-14 DIAGNOSIS — Z886 Allergy status to analgesic agent status: Secondary | ICD-10-CM | POA: Diagnosis not present

## 2013-12-15 DIAGNOSIS — K5730 Diverticulosis of large intestine without perforation or abscess without bleeding: Secondary | ICD-10-CM | POA: Diagnosis not present

## 2013-12-15 DIAGNOSIS — N390 Urinary tract infection, site not specified: Secondary | ICD-10-CM | POA: Diagnosis not present

## 2013-12-15 DIAGNOSIS — R109 Unspecified abdominal pain: Secondary | ICD-10-CM | POA: Diagnosis not present

## 2013-12-15 DIAGNOSIS — F319 Bipolar disorder, unspecified: Secondary | ICD-10-CM | POA: Diagnosis not present

## 2013-12-15 DIAGNOSIS — R1012 Left upper quadrant pain: Secondary | ICD-10-CM | POA: Diagnosis not present

## 2013-12-15 DIAGNOSIS — J45909 Unspecified asthma, uncomplicated: Secondary | ICD-10-CM | POA: Diagnosis not present

## 2013-12-16 DIAGNOSIS — R1012 Left upper quadrant pain: Secondary | ICD-10-CM | POA: Diagnosis not present

## 2013-12-16 DIAGNOSIS — J45909 Unspecified asthma, uncomplicated: Secondary | ICD-10-CM | POA: Diagnosis not present

## 2013-12-16 DIAGNOSIS — F319 Bipolar disorder, unspecified: Secondary | ICD-10-CM | POA: Diagnosis not present

## 2013-12-16 DIAGNOSIS — N390 Urinary tract infection, site not specified: Secondary | ICD-10-CM | POA: Diagnosis not present

## 2013-12-16 DIAGNOSIS — K5730 Diverticulosis of large intestine without perforation or abscess without bleeding: Secondary | ICD-10-CM | POA: Diagnosis not present

## 2013-12-20 DIAGNOSIS — Z79899 Other long term (current) drug therapy: Secondary | ICD-10-CM | POA: Diagnosis not present

## 2013-12-20 DIAGNOSIS — J45909 Unspecified asthma, uncomplicated: Secondary | ICD-10-CM | POA: Diagnosis not present

## 2013-12-20 DIAGNOSIS — R1032 Left lower quadrant pain: Secondary | ICD-10-CM | POA: Diagnosis not present

## 2013-12-20 DIAGNOSIS — IMO0002 Reserved for concepts with insufficient information to code with codable children: Secondary | ICD-10-CM | POA: Diagnosis not present

## 2013-12-20 DIAGNOSIS — F17200 Nicotine dependence, unspecified, uncomplicated: Secondary | ICD-10-CM | POA: Diagnosis not present

## 2013-12-20 DIAGNOSIS — F411 Generalized anxiety disorder: Secondary | ICD-10-CM | POA: Diagnosis not present

## 2013-12-20 DIAGNOSIS — M129 Arthropathy, unspecified: Secondary | ICD-10-CM | POA: Diagnosis not present

## 2013-12-20 DIAGNOSIS — IMO0001 Reserved for inherently not codable concepts without codable children: Secondary | ICD-10-CM | POA: Diagnosis not present

## 2013-12-20 DIAGNOSIS — F319 Bipolar disorder, unspecified: Secondary | ICD-10-CM | POA: Diagnosis not present

## 2013-12-20 DIAGNOSIS — N200 Calculus of kidney: Secondary | ICD-10-CM | POA: Diagnosis not present

## 2013-12-20 DIAGNOSIS — K7689 Other specified diseases of liver: Secondary | ICD-10-CM | POA: Diagnosis not present

## 2013-12-20 DIAGNOSIS — R1900 Intra-abdominal and pelvic swelling, mass and lump, unspecified site: Secondary | ICD-10-CM | POA: Diagnosis not present

## 2014-02-21 DIAGNOSIS — M545 Low back pain, unspecified: Secondary | ICD-10-CM | POA: Diagnosis not present

## 2014-02-21 DIAGNOSIS — J189 Pneumonia, unspecified organism: Secondary | ICD-10-CM | POA: Diagnosis not present

## 2014-02-21 DIAGNOSIS — G8929 Other chronic pain: Secondary | ICD-10-CM | POA: Diagnosis present

## 2014-02-21 DIAGNOSIS — Z79899 Other long term (current) drug therapy: Secondary | ICD-10-CM | POA: Diagnosis not present

## 2014-02-21 DIAGNOSIS — J45909 Unspecified asthma, uncomplicated: Secondary | ICD-10-CM | POA: Diagnosis not present

## 2014-02-21 DIAGNOSIS — Z883 Allergy status to other anti-infective agents status: Secondary | ICD-10-CM | POA: Diagnosis not present

## 2014-02-21 DIAGNOSIS — F411 Generalized anxiety disorder: Secondary | ICD-10-CM | POA: Diagnosis present

## 2014-02-21 DIAGNOSIS — K5732 Diverticulitis of large intestine without perforation or abscess without bleeding: Secondary | ICD-10-CM | POA: Diagnosis not present

## 2014-02-21 DIAGNOSIS — R109 Unspecified abdominal pain: Secondary | ICD-10-CM | POA: Diagnosis not present

## 2014-02-21 DIAGNOSIS — G43709 Chronic migraine without aura, not intractable, without status migrainosus: Secondary | ICD-10-CM | POA: Diagnosis not present

## 2014-02-21 DIAGNOSIS — Z886 Allergy status to analgesic agent status: Secondary | ICD-10-CM | POA: Diagnosis not present

## 2014-02-21 DIAGNOSIS — N200 Calculus of kidney: Secondary | ICD-10-CM | POA: Diagnosis not present

## 2014-02-21 DIAGNOSIS — F17200 Nicotine dependence, unspecified, uncomplicated: Secondary | ICD-10-CM | POA: Diagnosis present

## 2014-02-21 DIAGNOSIS — F319 Bipolar disorder, unspecified: Secondary | ICD-10-CM | POA: Diagnosis not present

## 2014-02-23 DIAGNOSIS — K5732 Diverticulitis of large intestine without perforation or abscess without bleeding: Secondary | ICD-10-CM | POA: Diagnosis not present

## 2014-02-23 DIAGNOSIS — R109 Unspecified abdominal pain: Secondary | ICD-10-CM | POA: Diagnosis not present

## 2014-03-02 DIAGNOSIS — Z1231 Encounter for screening mammogram for malignant neoplasm of breast: Secondary | ICD-10-CM | POA: Diagnosis not present

## 2014-03-09 DIAGNOSIS — N200 Calculus of kidney: Secondary | ICD-10-CM | POA: Diagnosis not present

## 2014-04-17 DIAGNOSIS — R05 Cough: Secondary | ICD-10-CM | POA: Diagnosis not present

## 2014-04-17 DIAGNOSIS — F419 Anxiety disorder, unspecified: Secondary | ICD-10-CM | POA: Diagnosis not present

## 2014-04-17 DIAGNOSIS — J9811 Atelectasis: Secondary | ICD-10-CM | POA: Diagnosis not present

## 2014-04-17 DIAGNOSIS — F17200 Nicotine dependence, unspecified, uncomplicated: Secondary | ICD-10-CM | POA: Diagnosis not present

## 2014-04-17 DIAGNOSIS — J90 Pleural effusion, not elsewhere classified: Secondary | ICD-10-CM | POA: Diagnosis not present

## 2014-04-17 DIAGNOSIS — J45901 Unspecified asthma with (acute) exacerbation: Secondary | ICD-10-CM | POA: Diagnosis not present

## 2014-04-17 DIAGNOSIS — J4521 Mild intermittent asthma with (acute) exacerbation: Secondary | ICD-10-CM | POA: Diagnosis not present

## 2014-04-17 DIAGNOSIS — F319 Bipolar disorder, unspecified: Secondary | ICD-10-CM | POA: Diagnosis not present

## 2014-04-17 DIAGNOSIS — Z79899 Other long term (current) drug therapy: Secondary | ICD-10-CM | POA: Diagnosis not present

## 2014-04-24 DIAGNOSIS — J4520 Mild intermittent asthma, uncomplicated: Secondary | ICD-10-CM | POA: Diagnosis not present

## 2014-05-01 DIAGNOSIS — N200 Calculus of kidney: Secondary | ICD-10-CM | POA: Diagnosis not present

## 2014-05-01 DIAGNOSIS — K5900 Constipation, unspecified: Secondary | ICD-10-CM | POA: Diagnosis not present

## 2014-05-01 DIAGNOSIS — E870 Hyperosmolality and hypernatremia: Secondary | ICD-10-CM | POA: Diagnosis not present

## 2014-05-01 DIAGNOSIS — R3915 Urgency of urination: Secondary | ICD-10-CM | POA: Diagnosis not present

## 2014-05-08 ENCOUNTER — Encounter: Payer: Self-pay | Admitting: Cardiology

## 2014-07-03 DIAGNOSIS — Z79899 Other long term (current) drug therapy: Secondary | ICD-10-CM | POA: Diagnosis not present

## 2014-07-03 DIAGNOSIS — Z720 Tobacco use: Secondary | ICD-10-CM | POA: Diagnosis not present

## 2014-07-03 DIAGNOSIS — M419 Scoliosis, unspecified: Secondary | ICD-10-CM | POA: Diagnosis not present

## 2014-07-03 DIAGNOSIS — F410 Panic disorder [episodic paroxysmal anxiety] without agoraphobia: Secondary | ICD-10-CM | POA: Diagnosis not present

## 2014-07-03 DIAGNOSIS — K589 Irritable bowel syndrome without diarrhea: Secondary | ICD-10-CM | POA: Diagnosis not present

## 2014-07-03 DIAGNOSIS — M1990 Unspecified osteoarthritis, unspecified site: Secondary | ICD-10-CM | POA: Diagnosis not present

## 2014-07-03 DIAGNOSIS — M797 Fibromyalgia: Secondary | ICD-10-CM | POA: Diagnosis not present

## 2014-07-03 DIAGNOSIS — J45909 Unspecified asthma, uncomplicated: Secondary | ICD-10-CM | POA: Diagnosis not present

## 2014-07-03 DIAGNOSIS — H8109 Meniere's disease, unspecified ear: Secondary | ICD-10-CM | POA: Diagnosis not present

## 2014-07-03 DIAGNOSIS — F418 Other specified anxiety disorders: Secondary | ICD-10-CM | POA: Diagnosis not present

## 2014-07-03 DIAGNOSIS — M5137 Other intervertebral disc degeneration, lumbosacral region: Secondary | ICD-10-CM | POA: Diagnosis not present

## 2014-07-03 DIAGNOSIS — Z90710 Acquired absence of both cervix and uterus: Secondary | ICD-10-CM | POA: Diagnosis not present

## 2014-07-03 DIAGNOSIS — F319 Bipolar disorder, unspecified: Secondary | ICD-10-CM | POA: Diagnosis not present

## 2014-07-03 DIAGNOSIS — K047 Periapical abscess without sinus: Secondary | ICD-10-CM | POA: Diagnosis not present

## 2014-07-21 DIAGNOSIS — Z79899 Other long term (current) drug therapy: Secondary | ICD-10-CM | POA: Diagnosis not present

## 2014-07-21 DIAGNOSIS — G43909 Migraine, unspecified, not intractable, without status migrainosus: Secondary | ICD-10-CM | POA: Diagnosis not present

## 2014-07-21 DIAGNOSIS — F17200 Nicotine dependence, unspecified, uncomplicated: Secondary | ICD-10-CM | POA: Diagnosis not present

## 2014-10-24 DIAGNOSIS — Z0000 Encounter for general adult medical examination without abnormal findings: Secondary | ICD-10-CM | POA: Diagnosis not present

## 2014-10-24 DIAGNOSIS — R1030 Lower abdominal pain, unspecified: Secondary | ICD-10-CM | POA: Diagnosis not present

## 2014-10-24 DIAGNOSIS — G43709 Chronic migraine without aura, not intractable, without status migrainosus: Secondary | ICD-10-CM | POA: Diagnosis not present

## 2015-01-12 DIAGNOSIS — M545 Low back pain: Secondary | ICD-10-CM | POA: Diagnosis not present

## 2015-01-12 DIAGNOSIS — Z79899 Other long term (current) drug therapy: Secondary | ICD-10-CM | POA: Diagnosis not present

## 2015-01-12 DIAGNOSIS — G8929 Other chronic pain: Secondary | ICD-10-CM | POA: Diagnosis not present

## 2015-01-12 DIAGNOSIS — F17200 Nicotine dependence, unspecified, uncomplicated: Secondary | ICD-10-CM | POA: Diagnosis not present

## 2015-01-15 DIAGNOSIS — Z01419 Encounter for gynecological examination (general) (routine) without abnormal findings: Secondary | ICD-10-CM | POA: Diagnosis not present

## 2015-01-15 DIAGNOSIS — Z6832 Body mass index (BMI) 32.0-32.9, adult: Secondary | ICD-10-CM | POA: Diagnosis not present

## 2015-01-15 DIAGNOSIS — Z1272 Encounter for screening for malignant neoplasm of vagina: Secondary | ICD-10-CM | POA: Diagnosis not present

## 2015-01-15 DIAGNOSIS — Z113 Encounter for screening for infections with a predominantly sexual mode of transmission: Secondary | ICD-10-CM | POA: Diagnosis not present

## 2015-04-18 DIAGNOSIS — M65849 Other synovitis and tenosynovitis, unspecified hand: Secondary | ICD-10-CM | POA: Diagnosis not present

## 2015-04-18 DIAGNOSIS — Z79899 Other long term (current) drug therapy: Secondary | ICD-10-CM | POA: Diagnosis not present

## 2015-04-18 DIAGNOSIS — L02811 Cutaneous abscess of head [any part, except face]: Secondary | ICD-10-CM | POA: Diagnosis not present

## 2015-04-18 DIAGNOSIS — F17200 Nicotine dependence, unspecified, uncomplicated: Secondary | ICD-10-CM | POA: Diagnosis not present

## 2015-04-18 DIAGNOSIS — F319 Bipolar disorder, unspecified: Secondary | ICD-10-CM | POA: Diagnosis not present

## 2015-06-02 DIAGNOSIS — F17200 Nicotine dependence, unspecified, uncomplicated: Secondary | ICD-10-CM | POA: Diagnosis not present

## 2015-06-02 DIAGNOSIS — J209 Acute bronchitis, unspecified: Secondary | ICD-10-CM | POA: Diagnosis not present

## 2015-06-02 DIAGNOSIS — Z79899 Other long term (current) drug therapy: Secondary | ICD-10-CM | POA: Diagnosis not present

## 2015-06-02 DIAGNOSIS — R05 Cough: Secondary | ICD-10-CM | POA: Diagnosis not present

## 2015-06-02 DIAGNOSIS — R0602 Shortness of breath: Secondary | ICD-10-CM | POA: Diagnosis not present

## 2015-06-02 DIAGNOSIS — F319 Bipolar disorder, unspecified: Secondary | ICD-10-CM | POA: Diagnosis not present

## 2015-06-02 DIAGNOSIS — J45901 Unspecified asthma with (acute) exacerbation: Secondary | ICD-10-CM | POA: Diagnosis not present

## 2015-06-11 DIAGNOSIS — F319 Bipolar disorder, unspecified: Secondary | ICD-10-CM | POA: Diagnosis not present

## 2015-08-21 DIAGNOSIS — F319 Bipolar disorder, unspecified: Secondary | ICD-10-CM | POA: Diagnosis not present

## 2015-08-21 DIAGNOSIS — S20219A Contusion of unspecified front wall of thorax, initial encounter: Secondary | ICD-10-CM | POA: Diagnosis not present

## 2015-08-21 DIAGNOSIS — Z79899 Other long term (current) drug therapy: Secondary | ICD-10-CM | POA: Diagnosis not present

## 2015-08-21 DIAGNOSIS — R079 Chest pain, unspecified: Secondary | ICD-10-CM | POA: Diagnosis not present

## 2015-08-21 DIAGNOSIS — F17200 Nicotine dependence, unspecified, uncomplicated: Secondary | ICD-10-CM | POA: Diagnosis not present

## 2015-08-28 DIAGNOSIS — F419 Anxiety disorder, unspecified: Secondary | ICD-10-CM | POA: Diagnosis not present

## 2015-08-28 DIAGNOSIS — Z881 Allergy status to other antibiotic agents status: Secondary | ICD-10-CM | POA: Diagnosis not present

## 2015-08-28 DIAGNOSIS — R0789 Other chest pain: Secondary | ICD-10-CM | POA: Diagnosis not present

## 2015-08-28 DIAGNOSIS — T148 Other injury of unspecified body region: Secondary | ICD-10-CM | POA: Diagnosis not present

## 2015-08-28 DIAGNOSIS — R002 Palpitations: Secondary | ICD-10-CM | POA: Diagnosis not present

## 2015-08-28 DIAGNOSIS — F17200 Nicotine dependence, unspecified, uncomplicated: Secondary | ICD-10-CM | POA: Diagnosis not present

## 2015-08-28 DIAGNOSIS — R079 Chest pain, unspecified: Secondary | ICD-10-CM | POA: Diagnosis not present

## 2015-12-11 DIAGNOSIS — F319 Bipolar disorder, unspecified: Secondary | ICD-10-CM | POA: Diagnosis not present

## 2016-01-10 DIAGNOSIS — A5900 Urogenital trichomoniasis, unspecified: Secondary | ICD-10-CM | POA: Diagnosis not present

## 2016-01-10 DIAGNOSIS — N3001 Acute cystitis with hematuria: Secondary | ICD-10-CM | POA: Diagnosis not present

## 2016-01-10 DIAGNOSIS — F17200 Nicotine dependence, unspecified, uncomplicated: Secondary | ICD-10-CM | POA: Diagnosis not present

## 2016-01-10 DIAGNOSIS — Z881 Allergy status to other antibiotic agents status: Secondary | ICD-10-CM | POA: Diagnosis not present

## 2016-01-10 DIAGNOSIS — L298 Other pruritus: Secondary | ICD-10-CM | POA: Diagnosis not present

## 2016-01-10 DIAGNOSIS — A599 Trichomoniasis, unspecified: Secondary | ICD-10-CM | POA: Diagnosis not present

## 2016-01-27 DIAGNOSIS — R1030 Lower abdominal pain, unspecified: Secondary | ICD-10-CM | POA: Diagnosis not present

## 2016-01-27 DIAGNOSIS — B373 Candidiasis of vulva and vagina: Secondary | ICD-10-CM | POA: Diagnosis not present

## 2016-01-27 DIAGNOSIS — F319 Bipolar disorder, unspecified: Secondary | ICD-10-CM | POA: Diagnosis not present

## 2016-01-27 DIAGNOSIS — R1084 Generalized abdominal pain: Secondary | ICD-10-CM | POA: Diagnosis not present

## 2016-01-27 DIAGNOSIS — Z79899 Other long term (current) drug therapy: Secondary | ICD-10-CM | POA: Diagnosis not present

## 2016-01-27 DIAGNOSIS — R300 Dysuria: Secondary | ICD-10-CM | POA: Diagnosis not present

## 2016-01-27 DIAGNOSIS — N390 Urinary tract infection, site not specified: Secondary | ICD-10-CM | POA: Diagnosis not present

## 2016-02-27 DIAGNOSIS — N760 Acute vaginitis: Secondary | ICD-10-CM | POA: Diagnosis not present

## 2016-02-27 DIAGNOSIS — A5901 Trichomonal vulvovaginitis: Secondary | ICD-10-CM | POA: Diagnosis not present

## 2016-02-27 DIAGNOSIS — N898 Other specified noninflammatory disorders of vagina: Secondary | ICD-10-CM | POA: Diagnosis not present

## 2016-03-01 DIAGNOSIS — M79674 Pain in right toe(s): Secondary | ICD-10-CM | POA: Diagnosis not present

## 2016-03-01 DIAGNOSIS — S99921A Unspecified injury of right foot, initial encounter: Secondary | ICD-10-CM | POA: Diagnosis not present

## 2016-03-01 DIAGNOSIS — W228XXA Striking against or struck by other objects, initial encounter: Secondary | ICD-10-CM | POA: Diagnosis not present

## 2016-03-01 DIAGNOSIS — S90211A Contusion of right great toe with damage to nail, initial encounter: Secondary | ICD-10-CM | POA: Diagnosis not present

## 2016-04-18 DIAGNOSIS — R109 Unspecified abdominal pain: Secondary | ICD-10-CM | POA: Diagnosis not present

## 2016-04-18 DIAGNOSIS — F17200 Nicotine dependence, unspecified, uncomplicated: Secondary | ICD-10-CM | POA: Diagnosis not present

## 2016-04-18 DIAGNOSIS — R160 Hepatomegaly, not elsewhere classified: Secondary | ICD-10-CM | POA: Diagnosis not present

## 2016-04-18 DIAGNOSIS — K5900 Constipation, unspecified: Secondary | ICD-10-CM | POA: Diagnosis not present

## 2016-04-18 DIAGNOSIS — R1084 Generalized abdominal pain: Secondary | ICD-10-CM | POA: Diagnosis not present

## 2016-04-18 DIAGNOSIS — J45909 Unspecified asthma, uncomplicated: Secondary | ICD-10-CM | POA: Diagnosis not present

## 2016-04-18 DIAGNOSIS — N390 Urinary tract infection, site not specified: Secondary | ICD-10-CM | POA: Diagnosis not present

## 2016-04-20 DIAGNOSIS — R1011 Right upper quadrant pain: Secondary | ICD-10-CM | POA: Diagnosis not present

## 2016-04-22 DIAGNOSIS — R1010 Upper abdominal pain, unspecified: Secondary | ICD-10-CM | POA: Diagnosis not present

## 2016-04-24 DIAGNOSIS — N390 Urinary tract infection, site not specified: Secondary | ICD-10-CM | POA: Diagnosis not present

## 2016-04-30 DIAGNOSIS — J45909 Unspecified asthma, uncomplicated: Secondary | ICD-10-CM | POA: Diagnosis not present

## 2016-04-30 DIAGNOSIS — R1011 Right upper quadrant pain: Secondary | ICD-10-CM | POA: Diagnosis not present

## 2016-05-06 DIAGNOSIS — R1084 Generalized abdominal pain: Secondary | ICD-10-CM | POA: Diagnosis not present

## 2016-05-12 DIAGNOSIS — K210 Gastro-esophageal reflux disease with esophagitis: Secondary | ICD-10-CM | POA: Diagnosis not present

## 2016-05-12 DIAGNOSIS — J45909 Unspecified asthma, uncomplicated: Secondary | ICD-10-CM | POA: Diagnosis not present

## 2016-05-12 DIAGNOSIS — K209 Esophagitis, unspecified: Secondary | ICD-10-CM | POA: Diagnosis not present

## 2016-05-12 DIAGNOSIS — F17200 Nicotine dependence, unspecified, uncomplicated: Secondary | ICD-10-CM | POA: Diagnosis not present

## 2016-05-12 DIAGNOSIS — K2950 Unspecified chronic gastritis without bleeding: Secondary | ICD-10-CM | POA: Diagnosis not present

## 2016-05-12 DIAGNOSIS — R1013 Epigastric pain: Secondary | ICD-10-CM | POA: Diagnosis not present

## 2016-05-12 DIAGNOSIS — K2970 Gastritis, unspecified, without bleeding: Secondary | ICD-10-CM | POA: Diagnosis not present

## 2016-05-13 DIAGNOSIS — R1013 Epigastric pain: Secondary | ICD-10-CM | POA: Diagnosis not present

## 2016-05-13 DIAGNOSIS — N390 Urinary tract infection, site not specified: Secondary | ICD-10-CM | POA: Diagnosis not present

## 2016-05-13 DIAGNOSIS — J45909 Unspecified asthma, uncomplicated: Secondary | ICD-10-CM | POA: Diagnosis not present

## 2016-05-13 DIAGNOSIS — K209 Esophagitis, unspecified: Secondary | ICD-10-CM | POA: Diagnosis not present

## 2016-05-13 DIAGNOSIS — F17200 Nicotine dependence, unspecified, uncomplicated: Secondary | ICD-10-CM | POA: Diagnosis not present

## 2016-05-13 DIAGNOSIS — R1011 Right upper quadrant pain: Secondary | ICD-10-CM | POA: Diagnosis not present

## 2016-05-13 DIAGNOSIS — K2970 Gastritis, unspecified, without bleeding: Secondary | ICD-10-CM | POA: Diagnosis not present

## 2016-05-13 DIAGNOSIS — G8929 Other chronic pain: Secondary | ICD-10-CM | POA: Diagnosis not present

## 2016-05-16 DIAGNOSIS — K648 Other hemorrhoids: Secondary | ICD-10-CM | POA: Diagnosis not present

## 2016-05-16 DIAGNOSIS — R1084 Generalized abdominal pain: Secondary | ICD-10-CM | POA: Diagnosis not present

## 2016-05-16 DIAGNOSIS — Z79899 Other long term (current) drug therapy: Secondary | ICD-10-CM | POA: Diagnosis not present

## 2016-05-16 DIAGNOSIS — K219 Gastro-esophageal reflux disease without esophagitis: Secondary | ICD-10-CM | POA: Diagnosis not present

## 2016-05-16 DIAGNOSIS — Z720 Tobacco use: Secondary | ICD-10-CM | POA: Diagnosis not present

## 2016-05-16 DIAGNOSIS — K5730 Diverticulosis of large intestine without perforation or abscess without bleeding: Secondary | ICD-10-CM | POA: Diagnosis not present

## 2016-05-16 DIAGNOSIS — F17200 Nicotine dependence, unspecified, uncomplicated: Secondary | ICD-10-CM | POA: Diagnosis not present

## 2016-05-16 DIAGNOSIS — J45909 Unspecified asthma, uncomplicated: Secondary | ICD-10-CM | POA: Diagnosis not present

## 2016-05-19 DIAGNOSIS — G8929 Other chronic pain: Secondary | ICD-10-CM | POA: Diagnosis not present

## 2016-05-19 DIAGNOSIS — F319 Bipolar disorder, unspecified: Secondary | ICD-10-CM | POA: Diagnosis not present

## 2016-05-19 DIAGNOSIS — R1011 Right upper quadrant pain: Secondary | ICD-10-CM | POA: Diagnosis not present

## 2016-05-19 DIAGNOSIS — M545 Low back pain: Secondary | ICD-10-CM | POA: Diagnosis not present

## 2016-05-22 DIAGNOSIS — F17200 Nicotine dependence, unspecified, uncomplicated: Secondary | ICD-10-CM | POA: Diagnosis not present

## 2016-05-22 DIAGNOSIS — R1033 Periumbilical pain: Secondary | ICD-10-CM | POA: Diagnosis not present

## 2016-05-22 DIAGNOSIS — K5900 Constipation, unspecified: Secondary | ICD-10-CM | POA: Diagnosis not present

## 2016-05-22 DIAGNOSIS — J45909 Unspecified asthma, uncomplicated: Secondary | ICD-10-CM | POA: Diagnosis not present

## 2016-05-22 DIAGNOSIS — R109 Unspecified abdominal pain: Secondary | ICD-10-CM | POA: Diagnosis not present

## 2016-05-24 DIAGNOSIS — Z23 Encounter for immunization: Secondary | ICD-10-CM | POA: Diagnosis not present

## 2016-05-24 DIAGNOSIS — R809 Proteinuria, unspecified: Secondary | ICD-10-CM | POA: Diagnosis not present

## 2016-05-24 DIAGNOSIS — R109 Unspecified abdominal pain: Secondary | ICD-10-CM | POA: Diagnosis not present

## 2016-05-26 DIAGNOSIS — R109 Unspecified abdominal pain: Secondary | ICD-10-CM | POA: Diagnosis not present

## 2016-05-26 DIAGNOSIS — G8929 Other chronic pain: Secondary | ICD-10-CM | POA: Diagnosis not present

## 2016-05-28 DIAGNOSIS — G43809 Other migraine, not intractable, without status migrainosus: Secondary | ICD-10-CM | POA: Diagnosis not present

## 2016-06-02 DIAGNOSIS — R109 Unspecified abdominal pain: Secondary | ICD-10-CM | POA: Diagnosis not present

## 2016-06-05 DIAGNOSIS — K5730 Diverticulosis of large intestine without perforation or abscess without bleeding: Secondary | ICD-10-CM | POA: Diagnosis not present

## 2016-06-05 DIAGNOSIS — R1031 Right lower quadrant pain: Secondary | ICD-10-CM | POA: Diagnosis not present

## 2016-06-05 DIAGNOSIS — R109 Unspecified abdominal pain: Secondary | ICD-10-CM | POA: Diagnosis not present

## 2016-06-12 DIAGNOSIS — R809 Proteinuria, unspecified: Secondary | ICD-10-CM | POA: Diagnosis not present

## 2016-06-25 DIAGNOSIS — Z800 Family history of malignant neoplasm of digestive organs: Secondary | ICD-10-CM | POA: Diagnosis not present

## 2016-06-25 DIAGNOSIS — K219 Gastro-esophageal reflux disease without esophagitis: Secondary | ICD-10-CM | POA: Diagnosis not present

## 2016-06-25 DIAGNOSIS — R109 Unspecified abdominal pain: Secondary | ICD-10-CM | POA: Diagnosis not present

## 2016-06-25 DIAGNOSIS — K7689 Other specified diseases of liver: Secondary | ICD-10-CM | POA: Diagnosis not present

## 2016-06-25 DIAGNOSIS — J45909 Unspecified asthma, uncomplicated: Secondary | ICD-10-CM | POA: Diagnosis not present

## 2016-07-15 DIAGNOSIS — R109 Unspecified abdominal pain: Secondary | ICD-10-CM | POA: Diagnosis not present

## 2016-07-15 DIAGNOSIS — F319 Bipolar disorder, unspecified: Secondary | ICD-10-CM | POA: Diagnosis not present

## 2016-07-31 DIAGNOSIS — F17210 Nicotine dependence, cigarettes, uncomplicated: Secondary | ICD-10-CM | POA: Diagnosis not present

## 2016-07-31 DIAGNOSIS — R1011 Right upper quadrant pain: Secondary | ICD-10-CM | POA: Diagnosis not present

## 2016-07-31 DIAGNOSIS — R12 Heartburn: Secondary | ICD-10-CM | POA: Diagnosis not present

## 2016-07-31 DIAGNOSIS — G8929 Other chronic pain: Secondary | ICD-10-CM | POA: Diagnosis not present

## 2016-07-31 DIAGNOSIS — R0781 Pleurodynia: Secondary | ICD-10-CM | POA: Diagnosis not present

## 2016-07-31 DIAGNOSIS — K769 Liver disease, unspecified: Secondary | ICD-10-CM | POA: Diagnosis not present

## 2016-08-03 DIAGNOSIS — J00 Acute nasopharyngitis [common cold]: Secondary | ICD-10-CM | POA: Diagnosis not present

## 2016-08-04 DIAGNOSIS — Z09 Encounter for follow-up examination after completed treatment for conditions other than malignant neoplasm: Secondary | ICD-10-CM | POA: Diagnosis not present

## 2016-08-18 DIAGNOSIS — F4310 Post-traumatic stress disorder, unspecified: Secondary | ICD-10-CM | POA: Diagnosis not present

## 2016-08-26 DIAGNOSIS — R0789 Other chest pain: Secondary | ICD-10-CM | POA: Diagnosis not present

## 2016-08-26 DIAGNOSIS — R079 Chest pain, unspecified: Secondary | ICD-10-CM | POA: Diagnosis not present

## 2016-08-26 DIAGNOSIS — S2341XS Sprain of ribs, sequela: Secondary | ICD-10-CM | POA: Diagnosis not present

## 2016-08-26 DIAGNOSIS — F3161 Bipolar disorder, current episode mixed, mild: Secondary | ICD-10-CM | POA: Diagnosis not present

## 2016-08-26 DIAGNOSIS — R0781 Pleurodynia: Secondary | ICD-10-CM | POA: Diagnosis not present

## 2016-08-26 DIAGNOSIS — J45909 Unspecified asthma, uncomplicated: Secondary | ICD-10-CM | POA: Diagnosis not present

## 2016-08-26 DIAGNOSIS — Z87891 Personal history of nicotine dependence: Secondary | ICD-10-CM | POA: Diagnosis not present

## 2016-08-26 DIAGNOSIS — R0602 Shortness of breath: Secondary | ICD-10-CM | POA: Diagnosis not present

## 2016-08-26 DIAGNOSIS — R05 Cough: Secondary | ICD-10-CM | POA: Diagnosis not present

## 2016-08-26 DIAGNOSIS — F17210 Nicotine dependence, cigarettes, uncomplicated: Secondary | ICD-10-CM | POA: Diagnosis not present

## 2016-08-26 DIAGNOSIS — G43909 Migraine, unspecified, not intractable, without status migrainosus: Secondary | ICD-10-CM | POA: Diagnosis not present

## 2016-09-01 DIAGNOSIS — F4310 Post-traumatic stress disorder, unspecified: Secondary | ICD-10-CM | POA: Diagnosis not present

## 2016-09-02 DIAGNOSIS — M797 Fibromyalgia: Secondary | ICD-10-CM | POA: Diagnosis not present

## 2016-09-02 DIAGNOSIS — Z6830 Body mass index (BMI) 30.0-30.9, adult: Secondary | ICD-10-CM | POA: Diagnosis not present

## 2016-09-02 DIAGNOSIS — R109 Unspecified abdominal pain: Secondary | ICD-10-CM | POA: Diagnosis not present

## 2016-09-02 DIAGNOSIS — E6609 Other obesity due to excess calories: Secondary | ICD-10-CM | POA: Diagnosis not present

## 2016-09-20 DIAGNOSIS — N3000 Acute cystitis without hematuria: Secondary | ICD-10-CM | POA: Diagnosis not present

## 2016-09-23 DIAGNOSIS — R109 Unspecified abdominal pain: Secondary | ICD-10-CM | POA: Diagnosis not present

## 2016-09-23 DIAGNOSIS — F17210 Nicotine dependence, cigarettes, uncomplicated: Secondary | ICD-10-CM | POA: Diagnosis not present

## 2016-09-23 DIAGNOSIS — N3001 Acute cystitis with hematuria: Secondary | ICD-10-CM | POA: Diagnosis not present

## 2016-09-23 DIAGNOSIS — E860 Dehydration: Secondary | ICD-10-CM | POA: Diagnosis not present

## 2016-09-23 DIAGNOSIS — M1990 Unspecified osteoarthritis, unspecified site: Secondary | ICD-10-CM | POA: Diagnosis not present

## 2016-09-23 DIAGNOSIS — Z79899 Other long term (current) drug therapy: Secondary | ICD-10-CM | POA: Diagnosis not present

## 2016-09-23 DIAGNOSIS — J45909 Unspecified asthma, uncomplicated: Secondary | ICD-10-CM | POA: Diagnosis not present

## 2016-09-24 DIAGNOSIS — F419 Anxiety disorder, unspecified: Secondary | ICD-10-CM | POA: Diagnosis not present

## 2016-09-24 DIAGNOSIS — F329 Major depressive disorder, single episode, unspecified: Secondary | ICD-10-CM | POA: Diagnosis not present

## 2016-09-24 DIAGNOSIS — R45851 Suicidal ideations: Secondary | ICD-10-CM | POA: Diagnosis not present

## 2016-09-26 DIAGNOSIS — H9190 Unspecified hearing loss, unspecified ear: Secondary | ICD-10-CM | POA: Diagnosis not present

## 2016-09-26 DIAGNOSIS — R42 Dizziness and giddiness: Secondary | ICD-10-CM | POA: Diagnosis not present

## 2016-09-26 DIAGNOSIS — Z6830 Body mass index (BMI) 30.0-30.9, adult: Secondary | ICD-10-CM | POA: Diagnosis not present

## 2016-09-26 DIAGNOSIS — F17200 Nicotine dependence, unspecified, uncomplicated: Secondary | ICD-10-CM | POA: Diagnosis not present

## 2016-09-26 DIAGNOSIS — K589 Irritable bowel syndrome without diarrhea: Secondary | ICD-10-CM | POA: Diagnosis not present

## 2016-09-26 DIAGNOSIS — M797 Fibromyalgia: Secondary | ICD-10-CM | POA: Diagnosis not present

## 2016-09-26 DIAGNOSIS — R5383 Other fatigue: Secondary | ICD-10-CM | POA: Diagnosis not present

## 2016-09-26 DIAGNOSIS — E6609 Other obesity due to excess calories: Secondary | ICD-10-CM | POA: Diagnosis not present

## 2016-10-01 DIAGNOSIS — R5383 Other fatigue: Secondary | ICD-10-CM | POA: Diagnosis not present

## 2016-10-01 DIAGNOSIS — Z6830 Body mass index (BMI) 30.0-30.9, adult: Secondary | ICD-10-CM | POA: Diagnosis not present

## 2016-10-01 DIAGNOSIS — R42 Dizziness and giddiness: Secondary | ICD-10-CM | POA: Diagnosis not present

## 2016-10-01 DIAGNOSIS — E6609 Other obesity due to excess calories: Secondary | ICD-10-CM | POA: Diagnosis not present

## 2016-10-10 DIAGNOSIS — F430 Acute stress reaction: Secondary | ICD-10-CM | POA: Diagnosis not present

## 2016-10-10 DIAGNOSIS — A57 Chancroid: Secondary | ICD-10-CM | POA: Diagnosis not present

## 2016-10-10 DIAGNOSIS — J069 Acute upper respiratory infection, unspecified: Secondary | ICD-10-CM | POA: Diagnosis not present

## 2016-10-10 DIAGNOSIS — B9789 Other viral agents as the cause of diseases classified elsewhere: Secondary | ICD-10-CM | POA: Diagnosis not present

## 2016-10-13 DIAGNOSIS — R51 Headache: Secondary | ICD-10-CM | POA: Diagnosis not present

## 2016-10-13 DIAGNOSIS — R42 Dizziness and giddiness: Secondary | ICD-10-CM | POA: Diagnosis not present

## 2016-10-13 DIAGNOSIS — W2209XA Striking against other stationary object, initial encounter: Secondary | ICD-10-CM | POA: Diagnosis not present

## 2016-10-13 DIAGNOSIS — S0990XA Unspecified injury of head, initial encounter: Secondary | ICD-10-CM | POA: Diagnosis not present

## 2016-10-13 DIAGNOSIS — W19XXXA Unspecified fall, initial encounter: Secondary | ICD-10-CM | POA: Diagnosis not present

## 2016-10-16 DIAGNOSIS — F4310 Post-traumatic stress disorder, unspecified: Secondary | ICD-10-CM | POA: Diagnosis not present

## 2016-10-23 DIAGNOSIS — F319 Bipolar disorder, unspecified: Secondary | ICD-10-CM | POA: Diagnosis not present

## 2016-10-23 DIAGNOSIS — F308 Other manic episodes: Secondary | ICD-10-CM | POA: Diagnosis not present

## 2016-10-23 DIAGNOSIS — R51 Headache: Secondary | ICD-10-CM | POA: Diagnosis not present

## 2016-10-25 DIAGNOSIS — R51 Headache: Secondary | ICD-10-CM | POA: Diagnosis not present

## 2016-10-25 DIAGNOSIS — H8109 Meniere's disease, unspecified ear: Secondary | ICD-10-CM | POA: Diagnosis not present

## 2016-10-25 DIAGNOSIS — H9203 Otalgia, bilateral: Secondary | ICD-10-CM | POA: Diagnosis not present

## 2016-10-25 DIAGNOSIS — Z743 Need for continuous supervision: Secondary | ICD-10-CM | POA: Diagnosis not present

## 2016-10-25 DIAGNOSIS — J45909 Unspecified asthma, uncomplicated: Secondary | ICD-10-CM | POA: Diagnosis not present

## 2016-10-25 DIAGNOSIS — M1990 Unspecified osteoarthritis, unspecified site: Secondary | ICD-10-CM | POA: Diagnosis not present

## 2016-10-25 DIAGNOSIS — S0990XA Unspecified injury of head, initial encounter: Secondary | ICD-10-CM | POA: Diagnosis not present

## 2016-10-25 DIAGNOSIS — F17210 Nicotine dependence, cigarettes, uncomplicated: Secondary | ICD-10-CM | POA: Diagnosis not present

## 2016-10-25 DIAGNOSIS — M797 Fibromyalgia: Secondary | ICD-10-CM | POA: Diagnosis not present

## 2016-10-25 DIAGNOSIS — R6889 Other general symptoms and signs: Secondary | ICD-10-CM | POA: Diagnosis not present

## 2016-10-25 DIAGNOSIS — R42 Dizziness and giddiness: Secondary | ICD-10-CM | POA: Diagnosis not present

## 2016-10-26 DIAGNOSIS — S0990XA Unspecified injury of head, initial encounter: Secondary | ICD-10-CM | POA: Diagnosis not present

## 2016-10-26 DIAGNOSIS — R51 Headache: Secondary | ICD-10-CM | POA: Diagnosis not present

## 2016-10-26 DIAGNOSIS — R42 Dizziness and giddiness: Secondary | ICD-10-CM | POA: Diagnosis not present

## 2016-10-27 DIAGNOSIS — F4310 Post-traumatic stress disorder, unspecified: Secondary | ICD-10-CM | POA: Diagnosis not present

## 2016-10-31 DIAGNOSIS — R51 Headache: Secondary | ICD-10-CM | POA: Diagnosis not present

## 2016-10-31 DIAGNOSIS — H9209 Otalgia, unspecified ear: Secondary | ICD-10-CM | POA: Diagnosis not present

## 2016-10-31 DIAGNOSIS — F309 Manic episode, unspecified: Secondary | ICD-10-CM | POA: Diagnosis not present

## 2016-10-31 DIAGNOSIS — R42 Dizziness and giddiness: Secondary | ICD-10-CM | POA: Diagnosis not present

## 2016-10-31 DIAGNOSIS — G8929 Other chronic pain: Secondary | ICD-10-CM | POA: Diagnosis not present

## 2016-11-24 DIAGNOSIS — F23 Brief psychotic disorder: Secondary | ICD-10-CM | POA: Diagnosis not present

## 2016-11-24 DIAGNOSIS — Z743 Need for continuous supervision: Secondary | ICD-10-CM | POA: Diagnosis not present

## 2016-11-29 DIAGNOSIS — F309 Manic episode, unspecified: Secondary | ICD-10-CM | POA: Diagnosis not present

## 2016-11-29 DIAGNOSIS — Z743 Need for continuous supervision: Secondary | ICD-10-CM | POA: Diagnosis not present

## 2016-12-31 DIAGNOSIS — H8102 Meniere's disease, left ear: Secondary | ICD-10-CM | POA: Diagnosis not present

## 2017-01-20 ENCOUNTER — Emergency Department: Admit: 2017-01-20 | Payer: MEDICARE | Primary: Family Medicine

## 2017-01-20 ENCOUNTER — Emergency Department: Payer: MEDICARE | Primary: Family Medicine

## 2017-01-20 ENCOUNTER — Inpatient Hospital Stay
Admit: 2017-01-20 | Discharge: 2017-01-20 | Disposition: A | Discharge status: Home / Self Care | Payer: MEDICARE | Attending: Internal Medicine

## 2017-01-20 DIAGNOSIS — R0789 Other chest pain: Secondary | ICD-10-CM

## 2017-01-20 DIAGNOSIS — H6693 Otitis media, unspecified, bilateral: Secondary | ICD-10-CM | POA: Diagnosis not present

## 2017-01-20 DIAGNOSIS — H6691 Otitis media, unspecified, right ear: Secondary | ICD-10-CM | POA: Diagnosis not present

## 2017-01-20 DIAGNOSIS — M79605 Pain in left leg: Secondary | ICD-10-CM | POA: Diagnosis not present

## 2017-01-20 DIAGNOSIS — N390 Urinary tract infection, site not specified: Secondary | ICD-10-CM | POA: Diagnosis not present

## 2017-01-20 DIAGNOSIS — M2550 Pain in unspecified joint: Secondary | ICD-10-CM | POA: Diagnosis not present

## 2017-01-20 DIAGNOSIS — G894 Chronic pain syndrome: Secondary | ICD-10-CM | POA: Diagnosis not present

## 2017-01-20 DIAGNOSIS — M25562 Pain in left knee: Secondary | ICD-10-CM | POA: Diagnosis not present

## 2017-01-20 DIAGNOSIS — M79604 Pain in right leg: Secondary | ICD-10-CM | POA: Diagnosis not present

## 2017-01-20 DIAGNOSIS — R079 Chest pain, unspecified: Secondary | ICD-10-CM | POA: Diagnosis not present

## 2017-01-20 DIAGNOSIS — Z87891 Personal history of nicotine dependence: Secondary | ICD-10-CM | POA: Diagnosis not present

## 2017-01-20 LAB — METABOLIC PANEL, COMPREHENSIVE
A-G Ratio: 1.1 (ref 0.8–1.7)
ALT (SGPT): 20 U/L (ref 13–56)
AST (SGOT): 11 U/L — ABNORMAL LOW (ref 15–37)
Albumin: 3.4 g/dL (ref 3.4–5.0)
Alk. phosphatase: 43 U/L — ABNORMAL LOW (ref 45–117)
Anion gap: 10 mmol/L (ref 3.0–18)
BUN/Creatinine ratio: 22 — ABNORMAL HIGH (ref 12–20)
BUN: 16 MG/DL (ref 7.0–18)
Bilirubin, total: 0.4 MG/DL (ref 0.2–1.0)
CO2: 26 mmol/L (ref 21–32)
Calcium: 8.8 MG/DL (ref 8.5–10.1)
Chloride: 109 mmol/L — ABNORMAL HIGH (ref 100–108)
Creatinine: 0.73 MG/DL (ref 0.6–1.3)
GFR est AA: >60 mL/min/{1.73_m2} (ref >60)
GFR est non-AA: >60 mL/min/{1.73_m2} (ref >60)
Globulin: 3 g/dL (ref 2.0–4.0)
Glucose: 96 mg/dL (ref 74–99)
Potassium: 3.8 mmol/L (ref 3.5–5.5)
Protein, total: 6.4 g/dL (ref 6.4–8.2)
Sodium: 145 mmol/L (ref 136–145)

## 2017-01-20 LAB — URINALYSIS W/ RFLX MICROSCOPIC
Bilirubin: NEGATIVE
Blood: NEGATIVE
Glucose: NEGATIVE mg/dL
Ketone: NEGATIVE mg/dL
Nitrites: NEGATIVE
Protein: NEGATIVE mg/dL
Specific gravity: 1.011 (ref 1.005–1.030)
Urobilinogen: 0.2 EU/dL (ref 0.2–1.0)
pH (UA): 5.5 (ref 5.0–8.0)

## 2017-01-20 LAB — URINE MICROSCOPIC ONLY
RBC: 0 /hpf (ref 0–5)
WBC: 4 /hpf (ref 0–5)

## 2017-01-20 LAB — CBC WITH AUTOMATED DIFF
ABS. BASOPHILS: 0.1 10*3/uL (ref 0.0–0.1)
ABS. EOSINOPHILS: 0.3 10*3/uL (ref 0.0–0.4)
ABS. LYMPHOCYTES: 3.1 10*3/uL (ref 0.9–3.6)
ABS. MONOCYTES: 0.6 10*3/uL (ref 0.05–1.2)
ABS. NEUTROPHILS: 3 10*3/uL (ref 1.8–8.0)
BASOPHILS: 1 % (ref 0–2)
EOSINOPHILS: 5 % (ref 0–5)
HCT: 39.9 % (ref 35.0–45.0)
HGB: 13.3 g/dL (ref 12.0–16.0)
LYMPHOCYTES: 43 % (ref 21–52)
MCH: 28.8 PG (ref 24.0–34.0)
MCHC: 33.3 g/dL (ref 31.0–37.0)
MCV: 86.4 FL (ref 74.0–97.0)
MONOCYTES: 9 % (ref 3–10)
MPV: 11.1 FL (ref 9.2–11.8)
NEUTROPHILS: 42 % (ref 40–73)
PLATELET: 211 10*3/uL (ref 135–420)
RBC: 4.62 M/uL (ref 4.20–5.30)
RDW: 13.8 % (ref 11.6–14.5)
WBC: 7.1 10*3/uL (ref 4.6–13.2)

## 2017-01-20 LAB — CARDIAC PANEL,(CK, CKMB & TROPONIN)
CK - MB: 1.9 ng/ml (ref <3.6)
CK-MB Index: 2.1 % (ref 0.0–4.0)
CK: 89 U/L (ref 26–192)
Troponin-I, QT: <0.02 NG/ML (ref 0.00–0.06)

## 2017-01-20 MED ORDER — PREGABALIN 75 MG CAP
75 mg | ORAL_CAPSULE | Freq: Two times a day (BID) | ORAL | 0 refills | Status: AC
Start: 2017-01-20 — End: ?

## 2017-01-20 MED ORDER — ACETAMINOPHEN 500 MG TAB
500 mg | ORAL | Status: AC
Start: 2017-01-20 — End: 2017-01-20
  Administered 2017-01-20: 13:00:00 via ORAL

## 2017-01-20 MED ORDER — ALUMINUM-MAGNESIUM HYDROXIDE 200 MG-200 MG/5 ML ORAL SUSP
200-200 mg/5 mL | ORAL | Status: AC
Start: 2017-01-20 — End: 2017-01-20
  Administered 2017-01-20: 13:00:00 via ORAL

## 2017-01-20 MED ORDER — FAMOTIDINE 20 MG TAB
20 mg | ORAL_TABLET | Freq: Two times a day (BID) | ORAL | 0 refills | Status: AC
Start: 2017-01-20 — End: 2017-01-30

## 2017-01-20 MED ORDER — CEPHALEXIN 500 MG CAP
500 mg | ORAL_CAPSULE | Freq: Three times a day (TID) | ORAL | 0 refills | Status: AC
Start: 2017-01-20 — End: 2017-01-27

## 2017-01-20 MED FILL — MAPAP EXTRA STRENGTH 500 MG TABLET: 500 mg | ORAL | Qty: 2

## 2017-01-20 MED FILL — MAG-AL 200 MG-200 MG/5 ML ORAL SUSPENSION: 200-200 mg/5 mL | ORAL | Qty: 30

## 2017-01-20 NOTE — ED Notes (Signed)
Discharge instructions given to pt. Pt states understanding instructions

## 2017-01-20 NOTE — ED Triage Notes (Signed)
Patient c/o chest pain that started when she woke up this morning. Patient reports she was taking her Uncle's water pills for swelling in her legs but [place] and has since been off of them for a few weeks now. Patient also reports dizziness, but has history of Meniere's disease.

## 2017-01-20 NOTE — ED Notes (Signed)
Report given to Becky, RN.

## 2017-01-20 NOTE — ED Provider Notes (Signed)
EMERGENCY DEPARTMENT HISTORY AND PHYSICAL EXAM    Date: 01/20/2017  Patient Name: Diane Gibson    History of Presenting Illness     Chief Complaint   Patient presents with   ??? Chest Pain         History Provided By: Patient    Chief Complaint: CP  Duration: 2 Hours  Timing:  Acute  Location: Chest  Modifying Factors: No relieving or worsening factors  Associated Symptoms:  bilateral leg swelling (x10 days), bilateral leg pain, bilateral hand pain w/ swelling, lower back pain, and right ear pain.    Additional History (Context):   7:24 AM  Diane Gibson is a 46 y.o. female with PMHX of GERD, vertigo,  who presents to the emergency department C/O CP, onset this morning. Associated sxs include bilateral leg swelling (x10 days), bilateral leg pain, bilateral hand pain w/ swelling, lower back pain, and right ear pain. Pt reports that she had similar sxs 6 years ago and that they subsided until this point. Pt reports that she stopped taking her "water pills" several months ago. Stress test performed 6 yrs ago that was normal. Pt reports having multiple workups for cardiac issues, kidney problems, and thyroid deficiencies w/o any acute abnormalities. Pt reports that she has not endorsed tobacco in over 4 months. Pt states that she recently moved here from [place]. Current medications include Seroquel 200 mg, Depakote 500 mg, Prilosec 10 mg, Antivert 25 mg, and Lyrica 75 mg. Allergic to Tramadol and Amoxicillin that result in hives. Pt denies abnormal bleeding, bloody stool, fever, chills, cough, LOC, SOB, new abd pain, hx of blood clots, and any other sxs or complaints.     PCP: None        Past History     Past Medical History:  Past Medical History:   Diagnosis Date   ??? Depression    ??? Meniere disease    ??? Palpitations        Past Surgical History:  History reviewed. No pertinent surgical history.    Family History:  History reviewed. No pertinent family history.    Social History:  Social History    Substance Use Topics   ??? Smoking status: Former Smoker   ??? Smokeless tobacco: Never Used      Comment: quit 4 months ago   ??? Alcohol use No       Allergies:  Allergies   Allergen Reactions   ??? Amoxicillin Other (comments)   ??? Tramadol Other (comments)         Review of Systems   Review of Systems   Constitutional: Negative for chills and fever.   HENT: Positive for ear pain.    Respiratory: Negative for cough and shortness of breath.    Cardiovascular: Positive for chest pain and leg swelling.   Gastrointestinal: Negative for anal bleeding and blood in stool.   Genitourinary: Negative for vaginal bleeding.   Musculoskeletal: Positive for arthralgias and back pain.   Neurological: Negative for syncope.   All other systems reviewed and are negative.      Physical Exam     Vitals:    01/20/17 0700 01/20/17 0707 01/20/17 0730   BP: 96/75 105/65    Pulse:  (!) 55 (!) 58   Resp:  18 17   Temp:  97.8 ??F (36.6 ??C)    SpO2:  98% 100%   Weight:  72.6 kg (160 lb)    Height:  5' 6"  (1.676 m)  Physical Exam   Constitutional: She is oriented to person, place, and time. She appears well-developed and well-nourished.   Lack of affect; disheveled   HENT:   Head: Normocephalic and atraumatic.   Right Ear: There is swelling and tenderness. Tympanic membrane is erythematous.   Left Ear: There is swelling and tenderness. Tympanic membrane is erythematous and bulging.   Nose: Nose normal.   Mouth/Throat: Oropharynx is clear and moist.   Dry mouth   Eyes: Conjunctivae and EOM are normal. Pupils are equal, round, and reactive to light. Right eye exhibits no discharge. Left eye exhibits no discharge. No scleral icterus.   Neck: Normal range of motion. Neck supple. No JVD present. No tracheal deviation present.   Cardiovascular: Normal rate, regular rhythm, normal heart sounds and intact distal pulses.    Pulmonary/Chest: Effort normal and breath sounds normal.   Abdominal: Soft. Bowel sounds are normal. She exhibits no distension.  There is no tenderness.   No HSM   Musculoskeletal: Normal range of motion.   Neurological: She is alert and oriented to person, place, and time. She has normal reflexes. No cranial nerve deficit. She exhibits normal muscle tone. Coordination normal.   No focal motor weakness.   Skin: Skin is warm and dry. No rash noted.   Healed surgical scar to right foot   Psychiatric: She has a normal mood and affect. Her behavior is normal.   Nursing note and vitals reviewed.        Diagnostic Study Results     Labs -     Recent Results (from the past 12 hour(s))   EKG, 12 LEAD, INITIAL    Collection Time: 01/20/17  7:05 AM   Result Value Ref Range    Ventricular Rate 56 BPM    Atrial Rate 56 BPM    P-R Interval 144 ms    QRS Duration 88 ms    Q-T Interval 444 ms    QTC Calculation (Bezet) 428 ms    Calculated P Axis 62 degrees    Calculated R Axis 30 degrees    Calculated T Axis 52 degrees    Diagnosis       Sinus bradycardia  Low voltage QRS  Borderline ECG  No previous ECGs available     CBC WITH AUTOMATED DIFF    Collection Time: 01/20/17  7:15 AM   Result Value Ref Range    WBC 7.1 4.6 - 13.2 K/uL    RBC 4.62 4.20 - 5.30 M/uL    HGB 13.3 12.0 - 16.0 g/dL    HCT 39.9 35.0 - 45.0 %    MCV 86.4 74.0 - 97.0 FL    MCH 28.8 24.0 - 34.0 PG    MCHC 33.3 31.0 - 37.0 g/dL    RDW 13.8 11.6 - 14.5 %    PLATELET 211 135 - 420 K/uL    MPV 11.1 9.2 - 11.8 FL    NEUTROPHILS 42 40 - 73 %    LYMPHOCYTES 43 21 - 52 %    MONOCYTES 9 3 - 10 %    EOSINOPHILS 5 0 - 5 %    BASOPHILS 1 0 - 2 %    ABS. NEUTROPHILS 3.0 1.8 - 8.0 K/UL    ABS. LYMPHOCYTES 3.1 0.9 - 3.6 K/UL    ABS. MONOCYTES 0.6 0.05 - 1.2 K/UL    ABS. EOSINOPHILS 0.3 0.0 - 0.4 K/UL    ABS. BASOPHILS 0.1 0.0 - 0.1 K/UL    DF  AUTOMATED     METABOLIC PANEL, COMPREHENSIVE    Collection Time: 01/20/17  7:15 AM   Result Value Ref Range    Sodium 145 136 - 145 mmol/L    Potassium 3.8 3.5 - 5.5 mmol/L    Chloride 109 (H) 100 - 108 mmol/L    CO2 26 21 - 32 mmol/L     Anion gap 10 3.0 - 18 mmol/L    Glucose 96 74 - 99 mg/dL    BUN 16 7.0 - 18 MG/DL    Creatinine 0.73 0.6 - 1.3 MG/DL    BUN/Creatinine ratio 22 (H) 12 - 20      GFR est AA >60 >60 ml/min/1.33m    GFR est non-AA >60 >60 ml/min/1.742m   Calcium 8.8 8.5 - 10.1 MG/DL    Bilirubin, total 0.4 0.2 - 1.0 MG/DL    ALT (SGPT) 20 13 - 56 U/L    AST (SGOT) 11 (L) 15 - 37 U/L    Alk. phosphatase 43 (L) 45 - 117 U/L    Protein, total 6.4 6.4 - 8.2 g/dL    Albumin 3.4 3.4 - 5.0 g/dL    Globulin 3.0 2.0 - 4.0 g/dL    A-G Ratio 1.1 0.8 - 1.7     CARDIAC PANEL,(CK, CKMB & TROPONIN)    Collection Time: 01/20/17  7:15 AM   Result Value Ref Range    CK 89 26 - 192 U/L    CK - MB 1.9 <3.6 ng/ml    CK-MB Index 2.1 0.0 - 4.0 %    Troponin-I, Qt. <0.02 0.00 - 0.06 NG/ML   URINALYSIS W/ RFLX MICROSCOPIC    Collection Time: 01/20/17  7:45 AM   Result Value Ref Range    Color YELLOW      Appearance CLEAR      Specific gravity 1.011 1.005 - 1.030      pH (UA) 5.5 5.0 - 8.0      Protein NEGATIVE  NEG mg/dL    Glucose NEGATIVE  NEG mg/dL    Ketone NEGATIVE  NEG mg/dL    Bilirubin NEGATIVE  NEG      Blood NEGATIVE  NEG      Urobilinogen 0.2 0.2 - 1.0 EU/dL    Nitrites NEGATIVE  NEG      Leukocyte Esterase LARGE (A) NEG     URINE MICROSCOPIC ONLY    Collection Time: 01/20/17  7:45 AM   Result Value Ref Range    WBC 4 to 10 0 - 5 /hpf    RBC 0 to 2 0 - 5 /hpf    Epithelial cells 2+ 0 - 5 /lpf    Bacteria 1+ (A) NEG /hpf       Radiologic Studies -   XR CHEST PORT    (Results Pending)     CT Results  (Last 48 hours)    None        CXR Results  (Last 48 hours)    None        8:50 AM  RADIOLOGY FINDINGS  Chest X-ray shows prominent incisional markings, NAP.  Pending review by Radiologist  Recorded by E. DeLetta MedianED Scribe, as dictated by PrRobbie LisMD    Medications given in the ED-  Medications   acetaminophen (TYLENOL) tablet 1,000 mg (1,000 mg Oral Given 01/20/17 0924)    aluminum-magnesium hydroxide (MAALOX) oral suspension 30 mL (30 mL Oral Given 01/20/17 0924)  Medical Decision Making   I am the first provider for this patient.    I reviewed the vital signs, available nursing notes, past medical history, past surgical history, family history and social history.    Vital Signs-Reviewed the patient's vital signs.    Pulse Oximetry Analysis - 100% on RA     EKG interpretation: (Preliminary)  6:55 AM   63 bpm. NSR. No STEMI.  EKG read by Robbie Lis, MD at 7:00 AM    EKG interpretation: (Subsequent)  7:05 AM   56 bpm. Sinus bradycardia. No STEMI.  EKG read by Robbie Lis, MD at 7:10 AM     Records Reviewed: Nursing Notes and Old Medical Records    Provider Notes (Medical Decision Making): Ddx: ACS, CHF, MI, PE, pneumonia, pneumothorax, esophageal spasms, GERD, anemia, uremia. Rule out: other cardiovascular, pulmonary, or GI pathology.    Procedures:  Procedures    ED Course:   7:24 AM Initial assessment performed. The patients presenting problems have been discussed, and they are in agreement with the care plan formulated and outlined with them.  I have encouraged them to ask questions as they arise throughout their visit.    9:00 AM pt re-assessed, has no chest pain, lungs clear bilaterally, heart exam wnl.   Pt was told her her mild uti, though keflex will treat both her otitis media and uti. Encouraged pt to see cardiologist for f/u within 2 to 3 days.     Diagnosis and Disposition       DISCHARGE NOTE:  9:06 AM  Cooper Render  results have been reviewed with her.  She has been counseled regarding her diagnosis, treatment, and plan.  She verbally conveys understanding and agreement of the signs, symptoms, diagnosis, treatment and prognosis and additionally agrees to follow up as discussed.  She also agrees with the care-plan and conveys that all of her questions have been answered.  I have also provided discharge instructions for her  that include: educational information regarding their diagnosis and treatment, and list of reasons why they would want to return to the ED prior to their follow-up appointment, should her condition change. She has been provided with education for proper emergency department utilization.     CLINICAL IMPRESSION:    1. Atypical chest pain    2. Arthralgia of both lower legs    3. Chronic pain syndrome    4. Acute otitis media, unspecified otitis media type        PLAN:  1. D/C Home  2.   Current Discharge Medication List      START taking these medications    Details   famotidine (PEPCID) 20 mg tablet Take 1 Tab by mouth two (2) times a day for 10 days.  Qty: 20 Tab, Refills: 0      cephALEXin (KEFLEX) 500 mg capsule Take 1 Cap by mouth three (3) times daily for 7 days.  Qty: 21 Cap, Refills: 0         CONTINUE these medications which have CHANGED    Details   pregabalin (LYRICA) 75 mg capsule Take 1 Cap by mouth two (2) times a day. Max Daily Amount: 150 mg.  Qty: 30 Cap, Refills: 0    Associated Diagnoses: Arthralgia of both lower legs           3.   Follow-up Information     Follow up With Details Comments Contact Info    Verl Bangs, MD Schedule an  appointment as soon as possible for a visit in 2 days For PCP follow up [place] [address]  [phone]      John H Windsor, DO Schedule an appointment as soon as possible for a visit For cardiology follow up [place] [address]  [phone]      [facility] EMERGENCY DEPT Go to As needed, If symptoms worsen [address]  [place] [address]  [phone]        _______________________________    Attestations:  This note is prepared by E. Letta Median, acting as Scribe for Robbie Lis, MD.    Robbie Lis, MD:  The scribe's documentation has been prepared under my direction and personally reviewed by me in its entirety.  I confirm that  the note above accurately reflects all work, treatment, procedures, and medical decision making performed by me.  _______________________________

## 2017-01-28 DIAGNOSIS — F319 Bipolar disorder, unspecified: Secondary | ICD-10-CM | POA: Diagnosis not present

## 2017-01-28 DIAGNOSIS — D1803 Hemangioma of intra-abdominal structures: Secondary | ICD-10-CM | POA: Diagnosis not present

## 2017-01-28 DIAGNOSIS — H8103 Meniere's disease, bilateral: Secondary | ICD-10-CM | POA: Diagnosis not present

## 2017-01-28 DIAGNOSIS — M797 Fibromyalgia: Secondary | ICD-10-CM | POA: Diagnosis not present

## 2017-01-28 DIAGNOSIS — H9192 Unspecified hearing loss, left ear: Secondary | ICD-10-CM | POA: Diagnosis not present

## 2017-01-28 DIAGNOSIS — R600 Localized edema: Secondary | ICD-10-CM | POA: Diagnosis not present

## 2017-01-31 LAB — EKG 12-LEAD
Atrial Rate: 56 {beats}/min
P Axis: 62 degrees
P-R Interval: 144 ms
Q-T Interval: 444 ms
QRS Duration: 88 ms
QTc Calculation (Bazett): 428 ms
R Axis: 30 degrees
T Axis: 52 degrees
Ventricular Rate: 56 {beats}/min

## 2017-01-31 LAB — EKG, 12 LEAD, INITIAL
Atrial Rate: 56 {beats}/min
Calculated P Axis: 62 degrees
Calculated R Axis: 30 degrees
Calculated T Axis: 52 degrees
P-R Interval: 144 ms
Q-T Interval: 444 ms
QRS Duration: 88 ms
QTC Calculation (Bezet): 428 ms
Ventricular Rate: 56 {beats}/min

## 2017-02-02 DIAGNOSIS — H9313 Tinnitus, bilateral: Secondary | ICD-10-CM | POA: Diagnosis not present

## 2017-02-02 DIAGNOSIS — H9192 Unspecified hearing loss, left ear: Secondary | ICD-10-CM | POA: Diagnosis not present

## 2017-02-02 DIAGNOSIS — H93293 Other abnormal auditory perceptions, bilateral: Secondary | ICD-10-CM | POA: Diagnosis not present

## 2017-02-02 DIAGNOSIS — M26623 Arthralgia of bilateral temporomandibular joint: Secondary | ICD-10-CM | POA: Diagnosis not present

## 2017-02-02 DIAGNOSIS — H9203 Otalgia, bilateral: Secondary | ICD-10-CM | POA: Diagnosis not present

## 2017-02-19 DIAGNOSIS — F419 Anxiety disorder, unspecified: Secondary | ICD-10-CM | POA: Diagnosis not present

## 2017-02-19 DIAGNOSIS — J45909 Unspecified asthma, uncomplicated: Secondary | ICD-10-CM | POA: Diagnosis not present

## 2017-02-19 DIAGNOSIS — M79605 Pain in left leg: Secondary | ICD-10-CM | POA: Diagnosis not present

## 2017-02-19 DIAGNOSIS — M79672 Pain in left foot: Secondary | ICD-10-CM | POA: Diagnosis not present

## 2017-02-19 DIAGNOSIS — Z79899 Other long term (current) drug therapy: Secondary | ICD-10-CM | POA: Diagnosis not present

## 2017-02-19 DIAGNOSIS — R609 Edema, unspecified: Secondary | ICD-10-CM | POA: Diagnosis not present

## 2017-02-19 DIAGNOSIS — M79671 Pain in right foot: Secondary | ICD-10-CM | POA: Diagnosis not present

## 2017-02-19 DIAGNOSIS — M797 Fibromyalgia: Secondary | ICD-10-CM | POA: Diagnosis not present

## 2017-02-19 DIAGNOSIS — M1990 Unspecified osteoarthritis, unspecified site: Secondary | ICD-10-CM | POA: Diagnosis not present

## 2017-02-19 DIAGNOSIS — F319 Bipolar disorder, unspecified: Secondary | ICD-10-CM | POA: Diagnosis not present

## 2017-02-19 DIAGNOSIS — Z87891 Personal history of nicotine dependence: Secondary | ICD-10-CM | POA: Diagnosis not present

## 2017-02-19 DIAGNOSIS — M79604 Pain in right leg: Secondary | ICD-10-CM | POA: Diagnosis not present

## 2017-02-27 DIAGNOSIS — F319 Bipolar disorder, unspecified: Secondary | ICD-10-CM | POA: Diagnosis not present

## 2017-03-04 DIAGNOSIS — R079 Chest pain, unspecified: Secondary | ICD-10-CM | POA: Diagnosis not present

## 2017-03-04 DIAGNOSIS — M797 Fibromyalgia: Secondary | ICD-10-CM | POA: Diagnosis not present

## 2017-03-04 DIAGNOSIS — D1803 Hemangioma of intra-abdominal structures: Secondary | ICD-10-CM | POA: Diagnosis not present

## 2017-03-04 DIAGNOSIS — Z87891 Personal history of nicotine dependence: Secondary | ICD-10-CM | POA: Diagnosis not present

## 2017-03-04 DIAGNOSIS — R5383 Other fatigue: Secondary | ICD-10-CM | POA: Diagnosis not present

## 2017-03-04 DIAGNOSIS — R609 Edema, unspecified: Secondary | ICD-10-CM | POA: Diagnosis not present

## 2017-03-04 DIAGNOSIS — G8929 Other chronic pain: Secondary | ICD-10-CM | POA: Diagnosis not present

## 2017-03-04 DIAGNOSIS — Z8249 Family history of ischemic heart disease and other diseases of the circulatory system: Secondary | ICD-10-CM | POA: Diagnosis not present

## 2017-03-04 DIAGNOSIS — F319 Bipolar disorder, unspecified: Secondary | ICD-10-CM | POA: Diagnosis not present

## 2017-03-11 DIAGNOSIS — R0789 Other chest pain: Secondary | ICD-10-CM | POA: Diagnosis not present

## 2017-03-11 DIAGNOSIS — G8929 Other chronic pain: Secondary | ICD-10-CM | POA: Diagnosis not present

## 2017-03-11 DIAGNOSIS — R079 Chest pain, unspecified: Secondary | ICD-10-CM | POA: Diagnosis not present

## 2017-03-18 DIAGNOSIS — R079 Chest pain, unspecified: Secondary | ICD-10-CM | POA: Diagnosis not present

## 2017-03-18 DIAGNOSIS — M797 Fibromyalgia: Secondary | ICD-10-CM | POA: Diagnosis not present

## 2017-03-18 DIAGNOSIS — F319 Bipolar disorder, unspecified: Secondary | ICD-10-CM | POA: Diagnosis not present

## 2017-03-18 DIAGNOSIS — Z87891 Personal history of nicotine dependence: Secondary | ICD-10-CM | POA: Diagnosis not present

## 2017-03-18 DIAGNOSIS — R609 Edema, unspecified: Secondary | ICD-10-CM | POA: Diagnosis not present

## 2017-03-18 DIAGNOSIS — G8929 Other chronic pain: Secondary | ICD-10-CM | POA: Diagnosis not present

## 2017-03-18 DIAGNOSIS — M79605 Pain in left leg: Secondary | ICD-10-CM | POA: Diagnosis not present

## 2017-03-18 DIAGNOSIS — M79604 Pain in right leg: Secondary | ICD-10-CM | POA: Diagnosis not present

## 2017-03-18 DIAGNOSIS — R5383 Other fatigue: Secondary | ICD-10-CM | POA: Diagnosis not present

## 2017-03-18 DIAGNOSIS — Z23 Encounter for immunization: Secondary | ICD-10-CM | POA: Diagnosis not present

## 2017-03-20 DIAGNOSIS — M79661 Pain in right lower leg: Secondary | ICD-10-CM | POA: Diagnosis not present

## 2017-03-20 DIAGNOSIS — M79662 Pain in left lower leg: Secondary | ICD-10-CM | POA: Diagnosis not present

## 2017-04-13 DIAGNOSIS — R51 Headache: Secondary | ICD-10-CM | POA: Diagnosis not present

## 2017-06-18 ENCOUNTER — Inpatient Hospital Stay
Admit: 2017-06-18 | Discharge: 2017-06-18 | Disposition: A | Discharge status: Home / Self Care | Payer: MEDICARE | Attending: Emergency Medicine

## 2017-06-18 DIAGNOSIS — M545 Low back pain: Secondary | ICD-10-CM

## 2017-06-18 MED ORDER — IBUPROFEN 600 MG TAB
600 mg | ORAL_TABLET | Freq: Four times a day (QID) | ORAL | 0 refills | Status: AC | PRN
Start: 2017-06-18 — End: ?

## 2017-06-18 MED ORDER — HYDROCODONE-ACETAMINOPHEN 5 MG-325 MG TAB
5-325 mg | ORAL_TABLET | ORAL | 0 refills | Status: AC | PRN
Start: 2017-06-18 — End: ?

## 2017-06-18 MED ORDER — CHLORZOXAZONE 500 MG TAB
500 mg | ORAL_TABLET | Freq: Three times a day (TID) | ORAL | 0 refills | Status: AC | PRN
Start: 2017-06-18 — End: ?

## 2017-06-18 NOTE — ED Triage Notes (Signed)
Pt reports she helped friend push her car up a hill 2 days ago, pt c/o low back pain

## 2017-06-18 NOTE — ED Provider Notes (Signed)
EMERGENCY DEPARTMENT HISTORY AND PHYSICAL EXAM    Date: 06/18/2017  Patient Name: Diane Gibson    History of Presenting Illness     Chief Complaint   Patient presents with   ??? Back Pain         History Provided By: Patient    Chief Complaint: back pain  Duration: "a couple days ago"  Timing:  Worsening  Location: bilateral lower back  Quality: Aching  Severity: Moderate  Modifying Factors: Nothing improves the pain  Associated Symptoms: denies any other associated signs or symptoms    Additional History (Context):   1:17 PM  Diane LoaBarbara Lacks is a 46 y.o. female with PMHX of palpitations and degenerative disc disease who presents to the emergency department C/O bilateral lower back pain. No associated sxs. Pt reports she helped her friend push a car up a hill. Notes nothing improves the pain but sitting, standing, and lying down worsen that pain. No relief with Ibuprofen and Tylenol. Repots drug allergies to Amoxicillin and Tramadol. PMHx includes meniere disease and depression. PSHx includes hysterectomy, cholecystectomy, appendectomy, c-section, hernia repair (x3), right foot surgery, tonsillectomy, adenoidectomy, and tympanostomy. Pt denies nausea, vomiting, weakness, numbness, urinary incontinence, bowel incontinence, and any other sxs or complaints.     PCP: Talbert CageValerio, Hernani A, MD    Current Outpatient Medications   Medication Sig Dispense Refill   ??? ibuprofen (MOTRIN) 600 mg tablet Take 1 Tab by mouth every six (6) hours as needed for Pain. Take with food. 20 Tab 0   ??? chlorzoxazone (PARAFON FORTE DSC) 500 mg tablet Take 1 Tab by mouth three (3) times daily as needed for Muscle Spasm(s). 21 Tab 0   ??? HYDROcodone-acetaminophen (NORCO) 5-325 mg per tablet Take 1 Tab by mouth every four (4) hours as needed for Pain. Max Daily Amount: 6 Tabs. 12 Tab 0   ??? QUEtiapine (SEROQUEL) 200 mg tablet Take 200 mg by mouth nightly.     ??? divalproex DR (DEPAKOTE) 500 mg tablet Take 500 mg by mouth two (2) times a day.      ??? omeprazole (PRILOSEC) 10 mg capsule Take 10 mg by mouth daily.     ??? meclizine (ANTIVERT) 25 mg tablet Take 25 mg by mouth three (3) times daily as needed.     ??? pregabalin (LYRICA) 75 mg capsule Take 1 Cap by mouth two (2) times a day. Max Daily Amount: 150 mg. 30 Cap 0       Past History     Past Medical History:  Past Medical History:   Diagnosis Date   ??? Depression    ??? Meniere disease    ??? Palpitations        Past Surgical History:  Past Surgical History:   Procedure Laterality Date   ??? HX APPENDECTOMY     ??? HX CESAREAN SECTION     ??? HX CHOLECYSTECTOMY     ??? HX HERNIA REPAIR      x 3   ??? HX HYSTERECTOMY     ??? HX ORTHOPAEDIC      rt foot surgery    ??? HX TONSIL AND ADENOIDECTOMY     ??? HX TYMPANOSTOMY         Family History:  History reviewed. No pertinent family history.    Social History:  Social History     Tobacco Use   ??? Smoking status: Former Smoker   ??? Smokeless tobacco: Never Used   ??? Tobacco comment: quit 4 months ago  Substance Use Topics   ??? Alcohol use: No   ??? Drug use: No       Allergies:  Allergies   Allergen Reactions   ??? Amoxicillin Other (comments)   ??? Tramadol Other (comments)         Review of Systems   Review of Systems   Gastrointestinal:        (-) bowel incontinence   Genitourinary: Negative for difficulty urinating.        (-) urinary incontinence   Musculoskeletal: Positive for back pain (bilateral lower back pain). Negative for joint swelling.   Neurological: Negative for weakness and numbness.   All other systems reviewed and are negative.      Physical Exam     Vitals:    06/18/17 1255   BP: 113/79   Pulse: 91   Resp: 18   Temp: 97.9 ??F (36.6 ??C)   SpO2: 98%   Weight: 90.7 kg (200 lb)   Height: 5\' 6"  (1.676 m)     Physical Exam   Constitutional: She is oriented to person, place, and time. She appears well-developed and well-nourished. No distress.   Female in NAD. Alert. Appears uncomfortable with movement.    HENT:   Head: Normocephalic and atraumatic.    Right Ear: External ear normal.   Left Ear: External ear normal.   Nose: Nose normal.   Eyes: Conjunctivae are normal.   Neck: Normal range of motion.   Cardiovascular: Normal rate, regular rhythm, normal heart sounds and intact distal pulses. Exam reveals no gallop and no friction rub.   No murmur heard.  Pulmonary/Chest: Breath sounds normal. No accessory muscle usage. No tachypnea. No respiratory distress. She has no decreased breath sounds. She has no wheezes. She has no rhonchi. She has no rales.   Musculoskeletal: Normal range of motion. She exhibits no edema.        Lumbar back: She exhibits tenderness. She exhibits normal range of motion, no swelling, no deformity (no step off), no pain and no spasm.        Back:    Neurological: She is alert and oriented to person, place, and time. She has normal strength. No sensory deficit. GCS eye subscore is 4. GCS verbal subscore is 5. GCS motor subscore is 6.   Skin: Skin is warm and dry. She is not diaphoretic.   Psychiatric: She has a normal mood and affect. Judgment normal.   Nursing note and vitals reviewed.        Diagnostic Study Results     Labs -   No results found for this or any previous visit (from the past 12 hour(s)).    Radiologic Studies -  No orders to display     CT Results  (Last 48 hours)    None        CXR Results  (Last 48 hours)    None          Medications given in the ED-  Medications - No data to display      Medical Decision Making   I am the first provider for this patient.    I reviewed the vital signs, available nursing notes, past medical history, past surgical history, family history and social history.    Vital Signs-Reviewed the patient's vital signs.    Pulse Oximetry Analysis - 98% on RA     Records Reviewed: Nursing Notes and Old Medical Records    Provider Notes (Medical Decision Making): CC of  back pain that is reproducible on exam w/ movement/ROM/palpation. Reports it is consistent  with prior episodes of back pain. No abdominal complaints/abdomen non tender on exam. No pulsatile mass appreciated. Normal neurologic exam. Not immunosupressed. Doubt epidural abscess, infection, neoplastic etiology. Not consistent with cauda equina. No trauma or midline tenderness. Doubt fracture. Most c/w musculoskeletal etiology. The patient shows no signs or symptoms of developing complication requiring inpatient treatment or management. Further laboratory or radiological investigation is not indicated. Will treat symptomatically with return immediately for new or worsening symptoms. The importance of close follow-up with PMD emphasized to patient.    Procedures:  Procedures    ED Course:   1:17 PM  Initial assessment performed. The patients presenting problems have been discussed, and they are in agreement with the care plan formulated and outlined with them.  I have encouraged them to ask questions as they arise throughout their visit.    Diagnosis and Disposition     No low back alarm sxs. Ambulatory. No FND.  Reasons to RTED discussed with pt. All questions answered. Pt feels comfortable going home at this time. Pt expressed understanding and she agrees with plan.      DISCHARGE NOTE:  1:23 PM  Sherryl BartersBarbara Mcloughlin's  results have been reviewed with her.  She has been counseled regarding her diagnosis, treatment, and plan.  She verbally conveys understanding and agreement of the signs, symptoms, diagnosis, treatment and prognosis and additionally agrees to follow up as discussed.  She also agrees with the care-plan and conveys that all of her questions have been answered.  I have also provided discharge instructions for her that include: educational information regarding their diagnosis and treatment, and list of reasons why they would want to return to the ED prior to their follow-up appointment, should her condition change. She has been provided with education for proper emergency department utilization.      CLINICAL IMPRESSION:    1. Bilateral low back pain without sciatica, unspecified chronicity    2. History of degenerative disc disease        PLAN:  1. D/C Home  2.   Discharge Medication List as of 06/18/2017  1:24 PM      START taking these medications    Details   ibuprofen (MOTRIN) 600 mg tablet Take 1 Tab by mouth every six (6) hours as needed for Pain. Take with food., Print, Disp-20 Tab, R-0      chlorzoxazone (PARAFON FORTE DSC) 500 mg tablet Take 1 Tab by mouth three (3) times daily as needed for Muscle Spasm(s)., Print, Disp-21 Tab, R-0      HYDROcodone-acetaminophen (NORCO) 5-325 mg per tablet Take 1 Tab by mouth every four (4) hours as needed for Pain. Max Daily Amount: 6 Tabs., Print, Disp-12 Tab, R-0         CONTINUE these medications which have NOT CHANGED    Details   QUEtiapine (SEROQUEL) 200 mg tablet Take 200 mg by mouth nightly., Historical Med      divalproex DR (DEPAKOTE) 500 mg tablet Take 500 mg by mouth two (2) times a day., Historical Med      omeprazole (PRILOSEC) 10 mg capsule Take 10 mg by mouth daily., Historical Med      meclizine (ANTIVERT) 25 mg tablet Take 25 mg by mouth three (3) times daily as needed., Historical Med      pregabalin (LYRICA) 75 mg capsule Take 1 Cap by mouth two (2) times a day. Max Daily Amount: 150  mg., Print, Disp-30 Cap, R-0           3.   Follow-up Information     Follow up With Specialties Details Why Contact Info    Talbert Cage, MD Family Practice Schedule an appointment as soon as possible for a visit in 2 days For primary care follow up [address] [facility]  [address] [place] [address]  [phone]      Christeen Douglas, MD Orthopedic Surgery Schedule an appointment as soon as possible for a visit in 1 day For follow up with orthopedic surgery [address]  [place] [address]  [phone]      [facility] EMERGENCY DEPT Emergency Medicine Go to As needed, if symptoms worsen [address]  [place] [address]  [phone]         _______________________________    Attestations:  This note is prepared by Eulis Foster, acting as Scribe for Cassandria Anger, PA-C.    Cassandria Anger, PA-C:  The scribe's documentation has been prepared under my direction and personally reviewed by me in its entirety.  I confirm that the note above accurately reflects all work, treatment, procedures, and medical decision making performed by me.      ____________________________

## 2017-06-18 NOTE — ED Notes (Signed)
I have reviewed discharge instructions with the patient.  The patient verbalized understanding. Patient armband removed and shredded

## 2017-06-20 ENCOUNTER — Inpatient Hospital Stay
Admit: 2017-06-20 | Discharge: 2017-06-20 | Disposition: A | Discharge status: Home / Self Care | Payer: MEDICARE | Attending: Emergency Medicine

## 2017-06-20 DIAGNOSIS — G8929 Other chronic pain: Secondary | ICD-10-CM

## 2017-06-20 MED ORDER — METHYLPREDNISOLONE 4 MG TABS IN A DOSE PACK
4 mg | ORAL | 0 refills | Status: AC
Start: 2017-06-20 — End: ?

## 2017-06-20 NOTE — ED Provider Notes (Signed)
EMERGENCY DEPARTMENT HISTORY AND PHYSICAL EXAM    Date: 06/20/2017  Patient Name: Diane Gibson    History of Presenting Illness     Chief Complaint   Patient presents with   ??? Back Pain         History Provided By: Patient    Chief Complaint: Back pain  Duration: "a few" days ago  Timing:  Worsening  Location: Bilateral lower back  Quality: Aching  Modifying Factors: Standing up or bending  Associated Symptoms: denies any other associated signs or symptoms    Additional History (Context):   5:06 PM   Diane Gibson is a 46 y.o. female who presents to the emergency department C/O bilateral lower back pain, onset a "few" days ago after helping someone push a car up a hill. No injury or fall. Worse with standing and bending. Pt was seen for the same complaint 2 days ago and was rx???ed Ibuprofen, Parafon, and Norco with minimal relief. Pt denies any hx of IV drugs abuse, chronic steroid use, fever, chills, n/v/d, difficulty urinating, and any other sxs or complaints.     PCP: Talbert CageValerio, Hernani A, MD    Current Outpatient Medications   Medication Sig Dispense Refill   ??? methylPREDNISolone (MEDROL, PAK,) 4 mg tablet Take tabs as directed 1 Dose Pack 0   ??? ibuprofen (MOTRIN) 600 mg tablet Take 1 Tab by mouth every six (6) hours as needed for Pain. Take with food. 20 Tab 0   ??? chlorzoxazone (PARAFON FORTE DSC) 500 mg tablet Take 1 Tab by mouth three (3) times daily as needed for Muscle Spasm(s). 21 Tab 0   ??? HYDROcodone-acetaminophen (NORCO) 5-325 mg per tablet Take 1 Tab by mouth every four (4) hours as needed for Pain. Max Daily Amount: 6 Tabs. 12 Tab 0   ??? QUEtiapine (SEROQUEL) 200 mg tablet Take 200 mg by mouth nightly.     ??? divalproex DR (DEPAKOTE) 500 mg tablet Take 500 mg by mouth two (2) times a day.     ??? omeprazole (PRILOSEC) 10 mg capsule Take 10 mg by mouth daily.     ??? meclizine (ANTIVERT) 25 mg tablet Take 25 mg by mouth three (3) times daily as needed.      ??? pregabalin (LYRICA) 75 mg capsule Take 1 Cap by mouth two (2) times a day. Max Daily Amount: 150 mg. 30 Cap 0       Past History     Past Medical History:  Past Medical History:   Diagnosis Date   ??? Depression    ??? Meniere disease    ??? Palpitations        Past Surgical History:  Past Surgical History:   Procedure Laterality Date   ??? HX APPENDECTOMY     ??? HX CESAREAN SECTION     ??? HX CHOLECYSTECTOMY     ??? HX HERNIA REPAIR      x 3   ??? HX HYSTERECTOMY     ??? HX ORTHOPAEDIC      rt foot surgery    ??? HX TONSIL AND ADENOIDECTOMY     ??? HX TYMPANOSTOMY         Family History:  History reviewed. No pertinent family history.    Social History:  Social History     Tobacco Use   ??? Smoking status: Former Smoker   ??? Smokeless tobacco: Never Used   ??? Tobacco comment: quit 4 months ago   Substance Use Topics   ???  Alcohol use: No   ??? Drug use: No       Allergies:  Allergies   Allergen Reactions   ??? Amoxicillin Other (comments)   ??? Tramadol Other (comments)         Review of Systems   Review of Systems   Constitutional: Negative for chills and fever.   Gastrointestinal: Negative for nausea and vomiting.   Musculoskeletal: Positive for back pain (Bilateral lower).   All other systems reviewed and are negative.      Physical Exam     Vitals:    06/20/17 1659 06/20/17 1703   BP: 105/69    Pulse: 92    Resp: 16    Temp: 98 ??F (36.7 ??C)    SpO2: 97%    Weight:  90.7 kg (200 lb)   Height:  5\' 6"  (1.676 m)     Physical Exam   Nursing note and vitals reviewed.  Constitutional: Obese, middle aged caucasian female, no acute distress  Head: Normocephalic, Atraumatic  Eyes: Pupils are equal, round, and reactive to light, EOMI, no scleral icterus, no conjunctival pallor  ENT: moist mucous membranes, hearing intact  Neck: Supple, non-tender  Cardiovascular: Regular rate and rhythm, no murmurs, rubs, or gallops  Chest: Normal work of breathing and chest excursion bilaterally  Lungs: Clear to ausculation bilaterally, no wheezes, no rhonchi   Abdomen: Soft, non tender, non distended, normoactive bowel sounds  Back: Lumbar paraspinal TTP. No midline tenderness, no step offs, or misalignment, no evidence of trauma or deformity  Extremities: No evidence of trauma or deformity, no LE edema  Skin: Warm and dry, normal cap refill  Neuro: Alert and appropriate, CN intact, normal speech, moving all 4 extremities freely and symmetrically  Psychiatric: Cooperative, appropriate mood       Diagnostic Study Results     Labs -   No results found for this or any previous visit (from the past 12 hour(s)).    Radiologic Studies -   No orders to display     CT Results  (Last 48 hours)    None        CXR Results  (Last 48 hours)    None          Medications given in the ED-  Medications - No data to display      Medical Decision Making   I am the first provider for this patient.    I reviewed the vital signs, available nursing notes, past medical history, past surgical history, family history and social history.    Vital Signs-Reviewed the patient's vital signs.    Pulse Oximetry Analysis - 97% on RA     Records Reviewed: Nursing Notes and Old Medical Records    Provider Notes (Medical Decision Making):   Discussion:  46 y.o. female presenting with atraumatic, low bilateral back pain. Pt was seen 2 days ago and discharged with Norco, Motrin, and muscle relaxers. On exam, she is well appearing, with no obvious signs of trauma, step offs or misalignment. She is neurologically intact and has no red flag sxs to indicate advanced imaging. Will discharge with Medrol pack and urged back and core exercise.    Procedures:  Procedures    ED Course:   5:06 PM Initial assessment performed. The patients presenting problems have been discussed, and they are in agreement with the care plan formulated and outlined with them.  I have encouraged them to ask questions as they arise throughout their visit.  Diagnosis and Disposition       DISCHARGE NOTE:  5:31 PM   Diane Gibson  results have been reviewed with her.  She has been counseled regarding her diagnosis, treatment, and plan.  She verbally conveys understanding and agreement of the signs, symptoms, diagnosis, treatment and prognosis and additionally agrees to follow up as discussed.  She also agrees with the care-plan and conveys that all of her questions have been answered.  I have also provided discharge instructions for her that include: educational information regarding their diagnosis and treatment, and list of reasons why they would want to return to the ED prior to their follow-up appointment, should her condition change. She has been provided with education for proper emergency department utilization.     CLINICAL IMPRESSION:    1. Chronic bilateral low back pain without sciatica        PLAN:  1. D/C Home  2.   Current Discharge Medication List      START taking these medications    Details   methylPREDNISolone (MEDROL, PAK,) 4 mg tablet Take tabs as directed  Qty: 1 Dose Pack, Refills: 0           3.   Follow-up Information     Follow up With Specialties Details Why Contact Info    Christeen Douglas, MD Orthopedic Surgery Schedule an appointment as soon as possible for a visit in 2 days Orthopedics follow up as needed.  [address] [place] [address]  [phone]      Talbert Cage, MD [facility] Schedule an appointment as soon as possible for a visit in 2 days  [address]  [address] [place] [address]  [phone]      [facility] EMERGENCY DEPT Emergency Medicine  As needed, if symptoms worsen [address]  [place] [address]  [phone]        _______________________________    Attestations:  This note is prepared by Hytham Soueid and Cinyu Chi, acting as Neurosurgeon for [employer], DO.    Eustace Quail, DO:  The scribe's documentation has been prepared under my direction and personally reviewed by me in its entirety.   I confirm that the note above accurately reflects all work, treatment, procedures, and medical decision making performed by me.  _______________________________

## 2017-06-20 NOTE — ED Notes (Signed)
I have reviewed discharge instructions with the patient.  The patient verbalized understanding.

## 2017-06-20 NOTE — ED Triage Notes (Signed)
Pt reports she was seen a few days ago for back pain here, she is taking the medications as prescribed and continues to have severe pain;

## 2017-07-29 ENCOUNTER — Emergency Department (HOSPITAL_COMMUNITY)
Admission: EM | Admit: 2017-07-29 | Discharge: 2017-07-29 | Disposition: A | Discharge status: Home / Self Care | Payer: Medicare (Managed Care) | Attending: Emergency Medicine | Admitting: Emergency Medicine

## 2017-07-29 ENCOUNTER — Other Ambulatory Visit: Payer: Self-pay

## 2017-07-29 ENCOUNTER — Encounter (HOSPITAL_COMMUNITY): Payer: Self-pay | Admitting: Emergency Medicine

## 2017-07-29 DIAGNOSIS — J45909 Unspecified asthma, uncomplicated: Secondary | ICD-10-CM | POA: Diagnosis not present

## 2017-07-29 DIAGNOSIS — Z79899 Other long term (current) drug therapy: Secondary | ICD-10-CM | POA: Diagnosis not present

## 2017-07-29 DIAGNOSIS — R51 Headache: Secondary | ICD-10-CM | POA: Insufficient documentation

## 2017-07-29 DIAGNOSIS — F17210 Nicotine dependence, cigarettes, uncomplicated: Secondary | ICD-10-CM | POA: Diagnosis not present

## 2017-07-29 DIAGNOSIS — R519 Headache, unspecified: Secondary | ICD-10-CM

## 2017-07-29 MED ORDER — PROCHLORPERAZINE EDISYLATE 5 MG/ML IJ SOLN
10.0000 mg | Freq: Once | INTRAMUSCULAR | Status: AC
  Administered 2017-07-29: 10 mg via INTRAVENOUS
  Filled 2017-07-29: qty 2

## 2017-07-29 MED ORDER — SODIUM CHLORIDE 0.9 % IV BOLUS (SEPSIS)
1000.0000 mL | Freq: Once | INTRAVENOUS | Status: AC
  Administered 2017-07-29: 1000 mL via INTRAVENOUS

## 2017-07-29 MED ORDER — KETOROLAC TROMETHAMINE 30 MG/ML IJ SOLN
15.0000 mg | Freq: Once | INTRAMUSCULAR | Status: AC
  Administered 2017-07-29: 15 mg via INTRAVENOUS
  Filled 2017-07-29: qty 1

## 2017-07-29 MED ORDER — DIPHENHYDRAMINE HCL 50 MG/ML IJ SOLN
12.5000 mg | Freq: Once | INTRAMUSCULAR | Status: AC
  Administered 2017-07-29: 12.5 mg via INTRAVENOUS
  Filled 2017-07-29: qty 1

## 2017-07-29 NOTE — ED Notes (Signed)
Pt reports she is feeling much better and is ready to leave. EDP notified.

## 2017-07-29 NOTE — ED Triage Notes (Signed)
Pt reports headache x4 days. Pt reports light and sound sensitivity. Pt also states "ive been getting them more frequently lately." nad noted.

## 2017-07-29 NOTE — ED Provider Notes (Signed)
[facility] EMERGENCY DEPARTMENT Provider Note   CSN: [id number] Arrival date & time: 07/29/17  1652     History   Chief Complaint Chief Complaint  Patient presents with  . Headache    HPI Allison Bolton is a 47 y.o. female.  HPI   Recent-year-old female with headache.  She has a past history of what she calls migraines.  Most recent headache began about 4 days ago.  She could not remember what she was doing specifically at onset.  Denies any trauma.  The headache is diffuse.  She reports photo and phonophobia.  No nausea or vomiting.  No neck pain or neck stiffness.  No acute numbness, tingling or focal loss of strength.  She has been taking Tylenol and Aleve without much improvement.  Past Medical History:  Diagnosis Date  . Arthritis   . Asthma   . Bipolar 1 disorder ([place])   . Diverticulosis   . Dysrhythmia    sts "I have heart palpitations"  . Fibromyalgia   . H/O hiatal hernia    4  . Headache(784.0)   . Meniere's disease   . S/P colonoscopy    Dr. Laural Golden 2010: few small diverticula at sigmoid. otherwise normal.   . Shortness of breath     Patient Active Problem List   Diagnosis Date Noted  . Chest pain 07/18/2013  . Bipolar I disorder, most recent episode (or current) depressed, severe, without mention of psychotic behavior 05/13/2013  . Smoking 05/01/2012  . Generalized anxiety disorder 06/27/2011  . Liver lesion 02/04/2011    Past Surgical History:  Procedure Laterality Date  . ABDOMINAL HYSTERECTOMY    . APPENDECTOMY    . CESAREAN SECTION    . CHOLECYSTECTOMY    . exploratory laparoscopy    . FOOT SURGERY    . HEMORRHOID SURGERY    . LAPAROSCOPIC APPENDECTOMY  02/19/2011   Procedure: APPENDECTOMY LAPAROSCOPIC;  Surgeon: Jamesetta So;  Location: [facility] ORS;  Service: General;  Laterality: N/A;  . LAPAROSCOPY  02/19/2011   Procedure: LAPAROSCOPY DIAGNOSTIC;  Surgeon: Jamesetta So;  Location: [facility];  Service: General;  Laterality: N/A;  . left  ovarian removal    . multiple hernia repairs    . Right ovarian removal    . TONSILLECTOMY    . TONSILLECTOMY AND ADENOIDECTOMY    . tubes in ears    . UMBILICAL HERNIA REPAIR  Dec 2011   Dr. Arnoldo Morale  . wisdom teeth removal      OB History    Gravida Para Term Preterm AB Living   5 2 2   3 2    SAB TAB Ectopic Multiple Live Births   2               Home Medications    Prior to Admission medications   Medication Sig Start Date End Date Taking? Authorizing Provider  busPIRone (BUSPAR) 10 MG tablet Take 1 tablet (10 mg total) by mouth 2 (two) times daily. 05/16/13   Patrecia Pour, NP  guaiFENesin (MUCINEX) 600 MG 12 hr tablet Take 1 tablet (600 mg total) by mouth 2 (two) times daily. 05/16/13   Patrecia Pour, NP  hydrOXYzine (ATARAX/VISTARIL) 25 MG tablet Take 1 tablet (25 mg total) by mouth every 6 (six) hours as needed for anxiety. 05/16/13   Patrecia Pour, NP  ibuprofen (ADVIL,MOTRIN) 200 MG tablet Take 400 mg by mouth every 6 (six) hours as needed for headache, mild pain or  moderate pain.    [provider]  lamoTRIgine (LAMICTAL) 200 MG tablet Take 1 tablet (200 mg total) by mouth at bedtime. 05/16/13   Patrecia Pour, NP  oxybutynin (DITROPAN-XL) 5 MG 24 hr tablet Take 1 tablet (5 mg total) by mouth at bedtime. 05/16/13   Patrecia Pour, NP  polyethylene glycol powder (GLYCOLAX/MIRALAX) powder Take 17 g by mouth as needed.  05/16/13   Patrecia Pour, NP  risperiDONE (RISPERDAL) 2 MG tablet Take 1 tablet (2 mg total) by mouth at bedtime. 05/16/13   Patrecia Pour, NP  traZODone (DESYREL) 100 MG tablet Take 2 tablets (200 mg total) by mouth at bedtime. 05/16/13   Patrecia Pour, NP    Family History Family History  Problem Relation Age of Onset  . Thyroid disease Mother   . Colon cancer Unknown        paternal and maternal grandfather  . Meniere's disease Other   . Colon cancer Father   . Colon cancer Brother   . Anesthesia problems Neg Hx   .  Hypotension Neg Hx   . Malignant hyperthermia Neg Hx   . Pseudochol deficiency Neg Hx     Social History Social History   Tobacco Use  . Smoking status: Current Every Day Smoker    Packs/day: 0.50    Years: 20.00    Pack years: 10.00    Types: Cigarettes    Start date: 07/07/1981  . Smokeless tobacco: Never Used  Substance Use Topics  . Alcohol use: No    Comment: not since June 2012  . Drug use: No    Comment: patient denies any-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     Allergies   Amoxicillin and Tramadol   Review of Systems Review of Systems  All systems reviewed and negative, other than as noted in HPI.  Physical Exam Updated Vital Signs BP 135/88 (BP Location: Right Arm)   Pulse 84   Temp 97.8 F (36.6 C) (Oral)   Resp 18   Ht 5\' 6"  (1.676 m)   Wt 95.3 kg (210 lb)   SpO2 99%   BMI 33.89 kg/m   Physical Exam  Constitutional: She is oriented to person, place, and time. She appears well-developed and well-nourished. No distress.  HENT:  Head: Normocephalic and atraumatic.  Eyes: Conjunctivae are normal. Right eye exhibits no discharge. Left eye exhibits no discharge.  Neck: Neck supple.  No nuchal rigidity  Cardiovascular: Normal rate, regular rhythm and normal heart sounds. Exam reveals no gallop and no friction rub.  No murmur heard. Pulmonary/Chest: Effort normal and breath sounds normal. No respiratory distress.  Abdominal: Soft. She exhibits no distension. There is no tenderness.  Musculoskeletal: She exhibits no edema or tenderness.  Neurological: She is alert and oriented to person, place, and time. No cranial nerve deficit. She exhibits normal muscle tone. Coordination normal.  Beach clear.  Content appropriate.  Cranial nerves II through XII are intact.  Strength is 5 out of 5 bilateral upper and lower extremities.  Good finger-nose testing bilaterally.  Skin: Skin is warm and dry.  Psychiatric: She has a normal mood and affect. Her behavior  is normal. Thought content normal.  Nursing note and vitals reviewed.    ED Treatments / Results  Labs (all labs ordered are listed, but only abnormal results are displayed) Labs Reviewed - No data to display  EKG  EKG Interpretation None       Radiology  No results found.  Procedures Procedures (including critical care time)  Medications Ordered in ED Medications  sodium chloride 0.9 % bolus 1,000 mL (1,000 mLs Intravenous New Bag/Given 07/29/17 1844)  ketorolac (TORADOL) 30 MG/ML injection 15 mg (15 mg Intravenous Given 07/29/17 1846)  prochlorperazine (COMPAZINE) injection 10 mg (10 mg Intravenous Given 07/29/17 1846)  diphenhydrAMINE (BENADRYL) injection 12.5 mg (12.5 mg Intravenous Given 07/29/17 1847)     Initial Impression / Assessment and Plan / ED Course  I have reviewed the triage vital signs and the nursing notes.  Pertinent labs & imaging results that were available during my care of the patient were reviewed by me and considered in my medical decision making (see chart for details).     46yF with headache. She reports a history of frequent headaches.  No acute trauma.  She is afebrile.  No nuchal rigidity.  No acute visual changes aside from the photophobia.  No significant red flags identified.  We will treat symptoms and reassess. Doubt SAH, meningitis, venous thrombosis, glaucoma, etc.   Final Clinical Impressions(s) / ED Diagnoses   Final diagnoses:  Nonintractable headache, unspecified chronicity pattern, unspecified headache type    ED Discharge Orders    None       Virgel Manifold, MD 07/29/17 2133

## 2017-08-11 ENCOUNTER — Encounter

## 2018-10-31 ENCOUNTER — Emergency Department (HOSPITAL_COMMUNITY)
Admission: EM | Admit: 2018-10-31 | Discharge: 2018-10-31 | Discharge status: Absent without leave | Payer: Medicare Other | Source: Home / Self Care

## 2018-10-31 ENCOUNTER — Other Ambulatory Visit: Payer: Self-pay

## 2018-10-31 ENCOUNTER — Encounter (HOSPITAL_COMMUNITY): Payer: Self-pay | Admitting: *Deleted

## 2018-10-31 ENCOUNTER — Emergency Department (HOSPITAL_COMMUNITY)
Admission: EM | Admit: 2018-10-31 | Discharge: 2018-10-31 | Disposition: A | Discharge status: Home / Self Care | Payer: Medicare Other | Attending: Emergency Medicine | Admitting: Emergency Medicine

## 2018-10-31 DIAGNOSIS — T50904A Poisoning by unspecified drugs, medicaments and biological substances, undetermined, initial encounter: Secondary | ICD-10-CM | POA: Diagnosis present

## 2018-10-31 DIAGNOSIS — T43501A Poisoning by unspecified antipsychotics and neuroleptics, accidental (unintentional), initial encounter: Secondary | ICD-10-CM | POA: Insufficient documentation

## 2018-10-31 DIAGNOSIS — F319 Bipolar disorder, unspecified: Secondary | ICD-10-CM | POA: Insufficient documentation

## 2018-10-31 DIAGNOSIS — Z79899 Other long term (current) drug therapy: Secondary | ICD-10-CM | POA: Insufficient documentation

## 2018-10-31 DIAGNOSIS — J45909 Unspecified asthma, uncomplicated: Secondary | ICD-10-CM | POA: Insufficient documentation

## 2018-10-31 DIAGNOSIS — F17210 Nicotine dependence, cigarettes, uncomplicated: Secondary | ICD-10-CM | POA: Insufficient documentation

## 2018-10-31 DIAGNOSIS — T50901A Poisoning by unspecified drugs, medicaments and biological substances, accidental (unintentional), initial encounter: Secondary | ICD-10-CM

## 2018-10-31 LAB — CBC WITH DIFFERENTIAL/PLATELET
Abs Immature Granulocytes: 0.03 10*3/uL (ref 0.00–0.07)
Basophils Absolute: 0.1 10*3/uL (ref 0.0–0.1)
Basophils Relative: 1 %
Eosinophils Absolute: 0.2 10*3/uL (ref 0.0–0.5)
Eosinophils Relative: 2 %
HCT: 39.6 % (ref 36.0–46.0)
Hemoglobin: 13.3 g/dL (ref 12.0–15.0)
Immature Granulocytes: 0 %
Lymphocytes Relative: 31 %
Lymphs Abs: 3.3 10*3/uL (ref 0.7–4.0)
MCH: 29.4 pg (ref 26.0–34.0)
MCHC: 33.6 g/dL (ref 30.0–36.0)
MCV: 87.6 fL (ref 80.0–100.0)
Monocytes Absolute: 1 10*3/uL (ref 0.1–1.0)
Monocytes Relative: 9 %
Neutro Abs: 6.2 10*3/uL (ref 1.7–7.7)
Neutrophils Relative %: 57 %
Platelets: 241 10*3/uL (ref 150–400)
RBC: 4.52 MIL/uL (ref 3.87–5.11)
RDW: 12.8 % (ref 11.5–15.5)
WBC: 10.8 10*3/uL — ABNORMAL HIGH (ref 4.0–10.5)
nRBC: 0 % (ref 0.0–0.2)

## 2018-10-31 LAB — COMPREHENSIVE METABOLIC PANEL
ALT: 11 U/L (ref 0–44)
AST: 10 U/L — ABNORMAL LOW (ref 15–41)
Albumin: 3.9 g/dL (ref 3.5–5.0)
Alkaline Phosphatase: 50 U/L (ref 38–126)
Anion gap: 9 (ref 5–15)
BUN: 21 mg/dL — ABNORMAL HIGH (ref 6–20)
CO2: 23 mmol/L (ref 22–32)
Calcium: 9.2 mg/dL (ref 8.9–10.3)
Chloride: 108 mmol/L (ref 98–111)
Creatinine, Ser: 0.66 mg/dL (ref 0.44–1.00)
GFR calc Af Amer: >60 mL/min (ref >60)
GFR calc non Af Amer: >60 mL/min (ref >60)
Glucose, Bld: 99 mg/dL (ref 70–99)
Potassium: 3.5 mmol/L (ref 3.5–5.1)
Sodium: 140 mmol/L (ref 135–145)
Total Bilirubin: 0.4 mg/dL (ref 0.3–1.2)
Total Protein: 6.4 g/dL — ABNORMAL LOW (ref 6.5–8.1)

## 2018-10-31 LAB — ACETAMINOPHEN LEVEL
Acetaminophen (Tylenol), Serum: <10 ug/mL — ABNORMAL LOW (ref 10–30)
Acetaminophen (Tylenol), Serum: <10 ug/mL — ABNORMAL LOW (ref 10–30)

## 2018-10-31 LAB — RAPID URINE DRUG SCREEN, HOSP PERFORMED
Amphetamines: NOT DETECTED
Barbiturates: NOT DETECTED
Benzodiazepines: NOT DETECTED
Cocaine: NOT DETECTED
Opiates: NOT DETECTED
Tetrahydrocannabinol: NOT DETECTED

## 2018-10-31 LAB — ETHANOL: Alcohol, Ethyl (B): <10 mg/dL (ref <10)

## 2018-10-31 LAB — SALICYLATE LEVEL: Salicylate Lvl: <7 mg/dL (ref 2.8–30.0)

## 2018-10-31 MED ORDER — IBUPROFEN 400 MG PO TABS
400.0000 mg | ORAL_TABLET | Freq: Once | ORAL | Status: AC
  Administered 2018-10-31: 03:00:00 400 mg via ORAL
  Filled 2018-10-31: qty 1

## 2018-10-31 MED ORDER — SODIUM CHLORIDE 0.9 % IV BOLUS (SEPSIS)
1000.0000 mL | Freq: Once | INTRAVENOUS | Status: AC
  Administered 2018-10-31: 1000 mL via INTRAVENOUS

## 2018-10-31 MED ORDER — FAMOTIDINE 20 MG PO TABS
20.0000 mg | ORAL_TABLET | Freq: Once | ORAL | Status: AC
  Administered 2018-10-31: 03:00:00 20 mg via ORAL
  Filled 2018-10-31: qty 1

## 2018-10-31 NOTE — ED Notes (Signed)
Several times pt will ask for Help, when Rn asks about what type of help pt needs, pt states " I have a lot going on right now and a lot going on in the world", RN asked pt again about any SI or HI, pt denies, states that she may want to talk to someone for help and that she has an appointment Monday at [facility], Dr Christy Gentles notified,

## 2018-10-31 NOTE — ED Triage Notes (Signed)
Pt states that she was having problems sleeping and accidentally took too much of her Seroquel today, pt reports that she took 3 400 mg tablets at  8 pm, again at 10 pm and then to two tablets prior to her friend calling ems, pt denies any Si or HI, states that she was recently seen at [facility] and was having problems sleeping due to he medication being changed from trazodone to Seroquel,

## 2018-10-31 NOTE — ED Notes (Signed)
ekg given to Dr Christy Gentles

## 2018-10-31 NOTE — ED Notes (Signed)
Poison control contacted and recommendations were Monitor pt at least 6 hours on cardiac monitor\ Perform ekg now and repeat in 4 hours Monitor for cns depression, airway compromise Repeat tylenol level again 4 hours from first draw Dr Christy Gentles notified,

## 2018-10-31 NOTE — ED Notes (Signed)
Pt's purse removed from pt's room and placed in locker,

## 2018-10-31 NOTE — ED Provider Notes (Signed)
[facility] EMERGENCY DEPARTMENT Provider Note   CSN: [id number] Arrival date & time: 10/31/18  0035    History   Chief Complaint Chief Complaint  Patient presents with  . Drug Overdose    HPI Allison Bolton is a 48 y.o. female.     The history is provided by the patient.  Drug Overdose  This is a new problem. The current episode started 3 to 5 hours ago. The problem occurs constantly. The problem has not changed since onset.Nothing aggravates the symptoms. Nothing relieves the symptoms.   Patient history of asthma, bipolar disorder, fibromyalgia presents with accidental overdose.  She reports she took up to 8 tablets of 400 mg Seroquel.  She reports she is having problems sleeping and thought that this may help.  She denies SI. She denies any other coingestants Past Medical History:  Diagnosis Date  . Arthritis   . Asthma   . Bipolar 1 disorder ([place])   . Diverticulosis   . Dysrhythmia    sts "I have heart palpitations"  . Fibromyalgia   . H/O hiatal hernia    4  . Headache(784.0)   . Meniere's disease   . S/P colonoscopy    Dr. Laural Golden 2010: few small diverticula at sigmoid. otherwise normal.   . Shortness of breath     Patient Active Problem List   Diagnosis Date Noted  . Chest pain 07/18/2013  . Bipolar I disorder, most recent episode (or current) depressed, severe, without mention of psychotic behavior 05/13/2013  . Smoking 05/01/2012  . Generalized anxiety disorder 06/27/2011  . Liver lesion 02/04/2011    Past Surgical History:  Procedure Laterality Date  . ABDOMINAL HYSTERECTOMY    . APPENDECTOMY    . CESAREAN SECTION    . CHOLECYSTECTOMY    . exploratory laparoscopy    . FOOT SURGERY    . HEMORRHOID SURGERY    . LAPAROSCOPIC APPENDECTOMY  02/19/2011   Procedure: APPENDECTOMY LAPAROSCOPIC;  Surgeon: Jamesetta So;  Location: [facility];  Service: General;  Laterality: N/A;  . LAPAROSCOPY  02/19/2011   Procedure: LAPAROSCOPY DIAGNOSTIC;  Surgeon:  Jamesetta So;  Location: [facility];  Service: General;  Laterality: N/A;  . left ovarian removal    . multiple hernia repairs    . Right ovarian removal    . TONSILLECTOMY    . TONSILLECTOMY AND ADENOIDECTOMY    . tubes in ears    . UMBILICAL HERNIA REPAIR  Dec 2011   Dr. Arnoldo Morale  . wisdom teeth removal       OB History    Gravida  5   Para  2   Term  2   Preterm      AB  3   Living  2     SAB  2   TAB      Ectopic      Multiple      Live Births               Home Medications    Prior to Admission medications   Medication Sig Start Date End Date Taking? Authorizing Provider  hydrOXYzine (ATARAX/VISTARIL) 25 MG tablet Take 1 tablet (25 mg total) by mouth every 6 (six) hours as needed for anxiety. 05/16/13   Patrecia Pour, NP  omeprazole (PRILOSEC) 20 MG capsule Take 20 mg by mouth daily.    [provider]  OMEPRAZOLE PO Take by mouth.    [provider]  pregabalin (LYRICA) 75  MG capsule Take 75 mg by mouth 2 (two) times daily.    [provider]  QUEtiapine (SEROQUEL) 200 MG tablet Take 400 mg by mouth at bedtime.    [provider]  QUEtiapine (SEROQUEL) 400 MG tablet  10/23/18   [provider]  albuterol (PROVENTIL) (2.5 MG/3ML) 0.083% nebulizer solution Take 2.5 mg by nebulization every 6 (six) hours as needed. For wheezing, allergies, and/or shortness of breath  09/11/11  [provider]  estrogens, conjugated, (PREMARIN) 0.625 MG tablet Take 0.625 mg by mouth daily.   09/11/11  [provider]    Family History Family History  Problem Relation Age of Onset  . Thyroid disease Mother   . Colon cancer Other        paternal and maternal grandfather  . Meniere's disease Other   . Colon cancer Father   . Colon cancer Brother   . Anesthesia problems Neg Hx   . Hypotension Neg Hx   . Malignant hyperthermia Neg Hx   . Pseudochol deficiency Neg Hx     Social History Social History    Tobacco Use  . Smoking status: Current Every Day Smoker    Packs/day: 0.50    Years: 20.00    Pack years: 10.00    Types: Cigarettes    Start date: 07/07/1981  . Smokeless tobacco: Never Used  Substance Use Topics  . Alcohol use: No    Comment: not since June 2012  . Drug use: No    Comment: patient denies any-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     Allergies   Amoxicillin and Tramadol   Review of Systems Review of Systems  Constitutional: Negative for fever.  Respiratory: Negative for cough.   Psychiatric/Behavioral: Positive for sleep disturbance. Negative for suicidal ideas. The patient is nervous/anxious.   All other systems reviewed and are negative.    Physical Exam Updated Vital Signs BP 122/88 (BP Location: Right Arm)   Pulse 92   Temp (!) 97.3 F (36.3 C) (Oral)   Resp 18   Ht 1.676 m (5\' 6" )   Wt 92 kg   SpO2 99%   BMI 32.74 kg/m   Physical Exam CONSTITUTIONAL: Disheveled, mildly anxious, slurred speech HEAD: Normocephalic/atraumatic EYES: EOMI/PERRL ENMT: Mucous membranes moist NECK: supple no meningeal signs SPINE/BACK:entire spine nontender CV: S1/S2 noted, no murmurs/rubs/gallops noted LUNGS: Lungs are clear to auscultation bilaterally, no apparent distress ABDOMEN: soft, nontender, no rebound or guarding, bowel sounds noted throughout abdomen GU:no cva tenderness NEURO: Pt is awake/alert/appropriate, moves all extremitiesx4.  No facial droop.  Mildly slurred speech noted EXTREMITIES: pulses normal/equal, full ROM SKIN: warm, color normal PSYCH: Anxious  ED Treatments / Results  Labs (all labs ordered are listed, but only abnormal results are displayed) Labs Reviewed  CBC WITH DIFFERENTIAL/PLATELET - Abnormal; Notable for the following components:      Result Value   WBC 10.8 (*)    All other components within normal limits  COMPREHENSIVE METABOLIC PANEL - Abnormal; Notable for the following components:   BUN 21 (*)    Total  Protein 6.4 (*)    AST 10 (*)    All other components within normal limits  ACETAMINOPHEN LEVEL - Abnormal; Notable for the following components:   Acetaminophen (Tylenol), Serum <10 (*)    All other components within normal limits  ACETAMINOPHEN LEVEL - Abnormal; Notable for the following components:   Acetaminophen (Tylenol), Serum <10 (*)    All other components within  normal limits  RAPID URINE DRUG SCREEN, HOSP PERFORMED  ETHANOL  SALICYLATE LEVEL    EKG EKG Interpretation  Date/Time:  Sunday October 31 2018 00:49:41 EDT Ventricular Rate:  85 PR Interval:    QRS Duration: 63 QT Interval:  377 QTC Calculation: 449 R Axis:   35 Text Interpretation:  Sinus rhythm Low voltage, precordial leads Abnormal R-wave progression, early transition Interpretation limited secondary to artifact Confirmed by Ripley Fraise [phone]) on 10/31/2018 1:00:02 AM   Radiology No results found.  Procedures Procedures    Medications Ordered in ED Medications  sodium chloride 0.9 % bolus 1,000 mL (0 mLs Intravenous Stopped 10/31/18 0404)  famotidine (PEPCID) tablet 20 mg (20 mg Oral Given 10/31/18 0304)  ibuprofen (ADVIL) tablet 400 mg (400 mg Oral Given 10/31/18 0304)     Initial Impression / Assessment and Plan / ED Course  I have reviewed the triage vital signs and the nursing notes.  Pertinent labs results that were available during my care of the patient were reviewed by me and considered in my medical decision making (see chart for details).       1:03 AM Presents after reported accidental overdose.  Over the course of several hours she took up to 8 tablets of Seroquel 400 mg She is awake alert , But is anxious and slurring her words  [facility] recommends 6 hours of monitoring.  Patient gave permission to make a phone call to her friend.  [phone] this is her friend "Abe People "and he is the one that called EMS.  He confirms the story that this was an accidental overdose.   Patient has not endorsed any suicidal ideation 2:16 AM labs Reassuring. She does have mild hypotension, but normal mental status, IV fluids ordered 5:25 AM Blood pressure is improved. She is awake alert no acute distress. At this point, patient is requesting discharge, will discharge home.  No SI reported. Will discharge home with outpatient resources.  Patient keeps asking for sleep medications, advised I would not be able to prescribe these Final Clinical Impressions(s) / ED Diagnoses   Final diagnoses:  Accidental drug overdose, initial encounter    ED Discharge Orders    None       Ripley Fraise, MD 10/31/18 [phone]

## 2019-01-12 ENCOUNTER — Encounter (HOSPITAL_COMMUNITY): Payer: Self-pay

## 2019-01-12 ENCOUNTER — Emergency Department (HOSPITAL_COMMUNITY)
Admission: EM | Admit: 2019-01-12 | Discharge: 2019-01-12 | Disposition: A | Discharge status: Home / Self Care | Payer: Medicare Other | Attending: Emergency Medicine | Admitting: Emergency Medicine

## 2019-01-12 ENCOUNTER — Other Ambulatory Visit: Payer: Self-pay

## 2019-01-12 DIAGNOSIS — Z79899 Other long term (current) drug therapy: Secondary | ICD-10-CM | POA: Diagnosis not present

## 2019-01-12 DIAGNOSIS — F17210 Nicotine dependence, cigarettes, uncomplicated: Secondary | ICD-10-CM | POA: Diagnosis not present

## 2019-01-12 DIAGNOSIS — J45909 Unspecified asthma, uncomplicated: Secondary | ICD-10-CM | POA: Diagnosis not present

## 2019-01-12 DIAGNOSIS — B9689 Other specified bacterial agents as the cause of diseases classified elsewhere: Secondary | ICD-10-CM

## 2019-01-12 DIAGNOSIS — R300 Dysuria: Secondary | ICD-10-CM | POA: Insufficient documentation

## 2019-01-12 DIAGNOSIS — N760 Acute vaginitis: Secondary | ICD-10-CM | POA: Diagnosis not present

## 2019-01-12 LAB — URINALYSIS, ROUTINE W REFLEX MICROSCOPIC
Bacteria, UA: NONE SEEN
Bilirubin Urine: NEGATIVE
Glucose, UA: NEGATIVE mg/dL
Hgb urine dipstick: NEGATIVE
Ketones, ur: NEGATIVE mg/dL
Nitrite: NEGATIVE
Protein, ur: NEGATIVE mg/dL
Specific Gravity, Urine: 1.026 (ref 1.005–1.030)
pH: 5 (ref 5.0–8.0)

## 2019-01-12 LAB — WET PREP, GENITAL
Sperm: NONE SEEN
Trich, Wet Prep: NONE SEEN
Yeast Wet Prep HPF POC: NONE SEEN

## 2019-01-12 MED ORDER — DOXYCYCLINE HYCLATE 100 MG PO CAPS: 100.0000 mg | ORAL_CAPSULE | Freq: Two times a day (BID) | ORAL | 0 refills | Status: DC

## 2019-01-12 MED ORDER — STERILE WATER FOR INJECTION IJ SOLN
INTRAMUSCULAR | Status: AC
  Filled 2019-01-12: qty 10

## 2019-01-12 MED ORDER — METRONIDAZOLE 500 MG PO TABS: 500.0000 mg | ORAL_TABLET | Freq: Two times a day (BID) | ORAL | 0 refills | Status: DC

## 2019-01-12 MED ORDER — CEFTRIAXONE SODIUM 250 MG IJ SOLR
250.0000 mg | Freq: Once | INTRAMUSCULAR | Status: AC
  Administered 2019-01-12: 250 mg via INTRAMUSCULAR
  Filled 2019-01-12: qty 250

## 2019-01-12 NOTE — ED Triage Notes (Addendum)
Pt has had dysuria, urgency, frequency, and vaginal discharge (whitish/yellow) x 2 days. Pt says it "feels like a UTI". No fever, no blood in urine observed.

## 2019-01-12 NOTE — ED Provider Notes (Signed)
[facility] EMERGENCY DEPARTMENT Provider Note   CSN: [id number] Arrival date & time: 01/12/19  1857     History   Chief Complaint Chief Complaint  Patient presents with   Dysuria    HPI Allison Bolton is a 48 y.o. female with a hx of bipolar 1 disorder, anxiety, asthma, fibromyalgia, & is s/o hysterectomy who presents to the ED w/ complaints of urinary sxs & vaginal discharge for the past 2-3 days. Patient reports urinary urgency & dysuria w/ vaginal discharge that is yellow & somewhat pruritic. Has had mild associated suprapubic discomfort. No alleviating/aggravating factors. No intervention PTA. Denies nausea, vomiting, fever, chills, diarrhea, vaginal bleeding, hematuria, or urinary frequency. She has been sexually active w/ 1 partner in the past 6 months w/o protection, not currently sexually active, she no longer menstruates.      HPI  Past Medical History:  Diagnosis Date   Arthritis    Asthma    Bipolar 1 disorder ([place])    Diverticulosis    Dysrhythmia    sts "I have heart palpitations"   Fibromyalgia    H/O hiatal hernia    4   Headache(784.0)    Meniere's disease    S/P colonoscopy    Dr. Laural Golden 2010: few small diverticula at sigmoid. otherwise normal.    Shortness of breath     Patient Active Problem List   Diagnosis Date Noted   Chest pain 07/18/2013   Bipolar I disorder, most recent episode (or current) depressed, severe, without mention of psychotic behavior 05/13/2013   Smoking 05/01/2012   Generalized anxiety disorder 06/27/2011   Liver lesion 02/04/2011    Past Surgical History:  Procedure Laterality Date   ABDOMINAL HYSTERECTOMY     APPENDECTOMY     CESAREAN SECTION     CHOLECYSTECTOMY     exploratory laparoscopy     FOOT SURGERY     HEMORRHOID SURGERY     LAPAROSCOPIC APPENDECTOMY  02/19/2011   Procedure: APPENDECTOMY LAPAROSCOPIC;  Surgeon: Jamesetta So;  Location: [facility];  Service: General;  Laterality: N/A;     LAPAROSCOPY  02/19/2011   Procedure: LAPAROSCOPY DIAGNOSTIC;  Surgeon: Jamesetta So;  Location: [facility];  Service: General;  Laterality: N/A;   left ovarian removal     multiple hernia repairs     Right ovarian removal     TONSILLECTOMY     TONSILLECTOMY AND ADENOIDECTOMY     tubes in ears     UMBILICAL HERNIA REPAIR  Dec 2011   Dr. Arnoldo Morale   wisdom teeth removal       OB History    Gravida  5   Para  2   Term  2   Preterm      AB  3   Living  2     SAB  2   TAB      Ectopic      Multiple      Live Births               Home Medications    Prior to Admission medications   Medication Sig Start Date End Date Taking? Authorizing Provider  hydrOXYzine (ATARAX/VISTARIL) 25 MG tablet Take 1 tablet (25 mg total) by mouth every 6 (six) hours as needed for anxiety. 05/16/13   Patrecia Pour, NP  omeprazole (PRILOSEC) 20 MG capsule Take 20 mg by mouth daily.    [provider]  OMEPRAZOLE PO Take by mouth.    [provider]  pregabalin (LYRICA) 75 MG capsule Take 75 mg by mouth 2 (two) times daily.    [provider]  QUEtiapine (SEROQUEL) 200 MG tablet Take 400 mg by mouth at bedtime.    [provider]  QUEtiapine (SEROQUEL) 400 MG tablet  10/23/18   [provider]  albuterol (PROVENTIL) (2.5 MG/3ML) 0.083% nebulizer solution Take 2.5 mg by nebulization every 6 (six) hours as needed. For wheezing, allergies, and/or shortness of breath  09/11/11  [provider]  estrogens, conjugated, (PREMARIN) 0.625 MG tablet Take 0.625 mg by mouth daily.   09/11/11  [provider]    Family History Family History  Problem Relation Age of Onset   Thyroid disease Mother    Colon cancer Other        paternal and maternal grandfather   Meniere's disease Other    Colon cancer Father    Colon cancer Brother    Anesthesia problems Neg Hx    Hypotension Neg Hx    Malignant hyperthermia Neg Hx     Pseudochol deficiency Neg Hx     Social History Social History   Tobacco Use   Smoking status: Current Every Day Smoker    Packs/day: 0.50    Years: 20.00    Pack years: 10.00    Types: Cigarettes    Start date: 07/07/1981   Smokeless tobacco: Never Used  Substance Use Topics   Alcohol use: No    Comment: not since June 2012   Drug use: No    Comment: patient denies any-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  Respiratory: Negative for shortness of breath.   Cardiovascular: Negative for chest pain.  Gastrointestinal: Negative for constipation, diarrhea, nausea and vomiting.  Genitourinary: Positive for dysuria, pelvic pain, urgency and vaginal discharge. Negative for flank pain, genital sores, hematuria and vaginal bleeding.  All other systems reviewed and are negative.    Physical Exam Updated Vital Signs BP 121/90 (BP Location: Right Arm)    Pulse 77    Temp 98 F (36.7 C) (Oral)    Resp 16    Ht 5\' 6"  (1.676 m)    Wt 81.6 kg    SpO2 97%    BMI 29.05 kg/m   Physical Exam Vitals signs and nursing note reviewed. Exam conducted with a chaperone present.  Constitutional:      General: She is not in acute distress.    Appearance: She is well-developed. She is not toxic-appearing.  HENT:     Head: Normocephalic and atraumatic.  Eyes:     General:        Right eye: No discharge.        Left eye: No discharge.     Conjunctiva/sclera: Conjunctivae normal.  Neck:     Musculoskeletal: Neck supple.  Cardiovascular:     Rate and Rhythm: Normal rate and regular rhythm.  Pulmonary:     Effort: Pulmonary effort is normal. No respiratory distress.     Breath sounds: Normal breath sounds. No wheezing, rhonchi or rales.  Abdominal:     General: There is no distension.     Palpations: Abdomen is soft.     Tenderness: There is abdominal  tenderness (mild suprapubic). There is no right CVA tenderness, left CVA tenderness, guarding or rebound.  Genitourinary:    Exam position:  Supine.     Labia:        Right: No lesion.        Left: No lesion.      Vagina: Vaginal discharge (mild amount of white to yellowish colored discharge) present. No bleeding.     Adnexa:        Right: No mass.         Left: No mass.       Comments: Patient diffusely uncomfortable throughout exam- no focal tenderness. No palpable masses or fluctuance.   Anderson Malta RN present as Producer, television/film/video.  Skin:    General: Skin is warm and dry.     Findings: No rash.  Neurological:     Mental Status: She is alert.     Comments: Clear speech.   Psychiatric:        Behavior: Behavior normal.    ED Treatments / Results  Labs (all labs ordered are listed, but only abnormal results are displayed) Labs Reviewed  WET PREP, GENITAL - Abnormal; Notable for the following components:      Result Value   Clue Cells Wet Prep HPF POC PRESENT (*)    WBC, Wet Prep HPF POC MODERATE (*)    All other components within normal limits  URINALYSIS, ROUTINE W REFLEX MICROSCOPIC - Abnormal; Notable for the following components:   Leukocytes,Ua TRACE (*)    All other components within normal limits  URINE CULTURE  RPR  HIV ANTIBODY (ROUTINE TESTING W REFLEX)  GC/CHLAMYDIA PROBE AMP ([place]) NOT AT [facility]    EKG None  Radiology No results found.  Procedures Procedures (including critical care time)  Medications Ordered in ED Medications  cefTRIAXone (ROCEPHIN) injection 250 mg (has no administration in time range)     Initial Impression / Assessment and Plan / ED Course  I have reviewed the triage vital signs and the nursing notes.  Pertinent labs & imaging results that were available during my care of the patient were reviewed by me and considered in my medical decision making (see chart for details).   Patient presents to the ED w/ urinary sxs, vaginal  discharge, & suprapubic pain. Nontoxic appearing, no apparent distress, vitals WNL. She is s/p appendectomy, hysterectomy, & bilateral oophorectomy.  Exam w/ mild suprapubic & diffuse bimanual exam tenderness. No peritoneal signs. Mild amount of vaginal discharge noted. UA with trace leukocytes- nitrite negative, no bacteria, not convincing for UTI- will culture given the patient's urinary sxs. S/o hysterectomy & bilateral oophorectomy- doubt ectopic pregnancy. Wet prep consistent w/ BV. Given her discharge & abdominal discomfort PID is of consideration, will cover for this w/ abx given her sxs & per discussion w/ Dr. Lacinda Axon. She is afebrile w/o focal adnexal tenderness or peritoneal signs to a specific side making TOA less likely, additionally she does not appear to need hospitalization w/ IV abx for PID- rocephin in the ED (PCN allergy but has taken keflex before on chart review) & prescriptions for doxy/flagyl. GC/chlamydia, HIV, RPR pending- aware of pending test results & need to inform all sexual partners if positive. Discussed importance of protection when sexually active. I discussed results, treatment plan, need for follow-up, and return precautions with the patient. Provided opportunity for questions, patient confirmed understanding and is in agreement with plan.   Findings and plan of care discussed with supervising physician Dr. Lacinda Axon who has evaluated patient & is in agreement.   Final Clinical Impressions(s) / ED Diagnoses   Final diagnoses:  Dysuria  BV (bacterial  vaginosis)    ED Discharge Orders         Ordered    doxycycline (VIBRAMYCIN) 100 MG capsule  2 times daily     01/12/19 2102    metroNIDAZOLE (FLAGYL) 500 MG tablet  2 times daily     01/12/19 2102           Leafy Kindle 01/12/19 2112    Nat Christen, MD 01/13/19 1515

## 2019-01-12 NOTE — Discharge Instructions (Signed)
You were seen in the emergency department tonight for urinary symptoms, vaginal discharge, and abdominal pain.  Your urinalysis did not seem consistent with a UTI. Your wet prep showed findings consistent with bacterial vaginosis, please see the attached handout.  You are concerned you may have pelvic inflammatory disease, and infection of the pelvis which can lead to discharge and urinary symptoms.  We are treating this with antibiotics including doxycycline and Flagyl.  Please take these antibiotics as prescribed.  Do not consume any alcohol when taking Flagyl as it can be extremely dangerous and life-threatening.  We have prescribed you new medication(s) today. Discuss the medications prescribed today with your pharmacist as they can have adverse effects and interactions with your other medicines including over the counter and prescribed medications. Seek medical evaluation if you start to experience new or abnormal symptoms after taking one of these medicines, seek care immediately if you start to experience difficulty breathing, feeling of your throat closing, facial swelling, or rash as these could be indications of a more serious allergic reaction  Please abstain from any type of intercourse until you have completed your antibiotics and for 1 week following this.  Please use protection when sexually active after completion of antibiotics.  We have tested you for gonorrhea, chlamydia, HIV, and syphilis, if any of these test results are positive we will call you with the results, if positive you will need to inform all sexual partners.  Please follow-up with OB/GYN within the next 3 days for reevaluation.  Return to the ER for new or worsening symptoms including but not limited to worsening pain, fever, inability to keep fluids down, or any other concerns.

## 2019-01-14 LAB — URINE CULTURE: Culture: <10000 — AB

## 2019-01-14 LAB — HIV ANTIBODY (ROUTINE TESTING W REFLEX): HIV Screen 4th Generation wRfx: NONREACTIVE

## 2019-01-14 LAB — GC/CHLAMYDIA PROBE AMP (~~LOC~~) NOT AT ARMC
Chlamydia: NEGATIVE
Neisseria Gonorrhea: NEGATIVE

## 2019-01-14 LAB — RPR: RPR Ser Ql: NONREACTIVE

## 2019-02-16 ENCOUNTER — Emergency Department (HOSPITAL_COMMUNITY): Payer: Medicare Other

## 2019-02-16 ENCOUNTER — Emergency Department (HOSPITAL_COMMUNITY)
Admission: EM | Admit: 2019-02-16 | Discharge: 2019-02-16 | Disposition: A | Discharge status: Home / Self Care | Payer: Medicare Other | Attending: Emergency Medicine | Admitting: Emergency Medicine

## 2019-02-16 ENCOUNTER — Encounter (HOSPITAL_COMMUNITY): Payer: Self-pay | Admitting: Emergency Medicine

## 2019-02-16 ENCOUNTER — Other Ambulatory Visit: Payer: Self-pay

## 2019-02-16 DIAGNOSIS — R059 Cough, unspecified: Secondary | ICD-10-CM

## 2019-02-16 DIAGNOSIS — J45909 Unspecified asthma, uncomplicated: Secondary | ICD-10-CM | POA: Diagnosis not present

## 2019-02-16 DIAGNOSIS — F17210 Nicotine dependence, cigarettes, uncomplicated: Secondary | ICD-10-CM | POA: Insufficient documentation

## 2019-02-16 DIAGNOSIS — Z20828 Contact with and (suspected) exposure to other viral communicable diseases: Secondary | ICD-10-CM | POA: Insufficient documentation

## 2019-02-16 DIAGNOSIS — R05 Cough: Secondary | ICD-10-CM | POA: Diagnosis not present

## 2019-02-16 MED ORDER — SULFAMETHOXAZOLE-TRIMETHOPRIM 800-160 MG PO TABS: 1.0000 | ORAL_TABLET | Freq: Two times a day (BID) | ORAL | 0 refills | Status: AC

## 2019-02-16 NOTE — ED Provider Notes (Signed)
[facility] EMERGENCY DEPARTMENT Provider Note   CSN: [id number] Arrival date & time: 02/16/19  3244     History   Chief Complaint Chief Complaint  Patient presents with  . Cough    HPI Allison Bolton is a 48 y.o. female.     Patient complains of a cough and a right earache   The history is provided by the patient. No language interpreter was used.  Cough Cough characteristics:  Non-productive Sputum characteristics:  Nondescript Severity:  Mild Onset quality:  Sudden Timing:  Constant Progression:  Waxing and waning Chronicity:  New Smoker: no   Context: not animal exposure   Relieved by:  Nothing Associated symptoms: no chest pain, no eye discharge, no headaches and no rash     Past Medical History:  Diagnosis Date  . Arthritis   . Asthma   . Bipolar 1 disorder ([place])   . Diverticulosis   . Dysrhythmia    sts "I have heart palpitations"  . Fibromyalgia   . H/O hiatal hernia    4  . Headache(784.0)   . Meniere's disease   . S/P colonoscopy    Dr. Laural Golden 2010: few small diverticula at sigmoid. otherwise normal.   . Shortness of breath     Patient Active Problem List   Diagnosis Date Noted  . Chest pain 07/18/2013  . Bipolar I disorder, most recent episode (or current) depressed, severe, without mention of psychotic behavior 05/13/2013  . Smoking 05/01/2012  . Generalized anxiety disorder 06/27/2011  . Liver lesion 02/04/2011    Past Surgical History:  Procedure Laterality Date  . ABDOMINAL HYSTERECTOMY    . APPENDECTOMY    . CESAREAN SECTION    . CHOLECYSTECTOMY    . exploratory laparoscopy    . FOOT SURGERY    . HEMORRHOID SURGERY    . LAPAROSCOPIC APPENDECTOMY  02/19/2011   Procedure: APPENDECTOMY LAPAROSCOPIC;  Surgeon: Jamesetta So;  Location: [facility];  Service: General;  Laterality: N/A;  . LAPAROSCOPY  02/19/2011   Procedure: LAPAROSCOPY DIAGNOSTIC;  Surgeon: Jamesetta So;  Location: [facility];  Service: General;  Laterality: N/A;   . left ovarian removal    . multiple hernia repairs    . Right ovarian removal    . TONSILLECTOMY    . TONSILLECTOMY AND ADENOIDECTOMY    . tubes in ears    . UMBILICAL HERNIA REPAIR  Dec 2011   Dr. Arnoldo Morale  . wisdom teeth removal       OB History    Gravida  5   Para  2   Term  2   Preterm      AB  3   Living  2     SAB  2   TAB      Ectopic      Multiple      Live Births               Home Medications    Prior to Admission medications   Medication Sig Start Date End Date Taking? Authorizing Provider  doxycycline (VIBRAMYCIN) 100 MG capsule Take 1 capsule (100 mg total) by mouth 2 (two) times daily. 01/12/19   Petrucelli, Samantha R, PA-C  hydrOXYzine (ATARAX/VISTARIL) 25 MG tablet Take 1 tablet (25 mg total) by mouth every 6 (six) hours as needed for anxiety. 05/16/13   Patrecia Pour, NP  metroNIDAZOLE (FLAGYL) 500 MG tablet Take 1 tablet (500 mg total) by mouth 2 (two) times daily. 01/12/19  Petrucelli, Samantha R, PA-C  omeprazole (PRILOSEC) 20 MG capsule Take 20 mg by mouth daily.    [provider]  OMEPRAZOLE PO Take by mouth.    [provider]  pregabalin (LYRICA) 75 MG capsule Take 75 mg by mouth 2 (two) times daily.    [provider]  QUEtiapine (SEROQUEL) 200 MG tablet Take 400 mg by mouth at bedtime.    [provider]  QUEtiapine (SEROQUEL) 400 MG tablet  10/23/18   [provider]  sulfamethoxazole-trimethoprim (BACTRIM DS) 800-160 MG tablet Take 1 tablet by mouth 2 (two) times daily for 7 days. 02/16/19 02/23/19  Milton Ferguson, MD  albuterol (PROVENTIL) (2.5 MG/3ML) 0.083% nebulizer solution Take 2.5 mg by nebulization every 6 (six) hours as needed. For wheezing, allergies, and/or shortness of breath  09/11/11  [provider]  estrogens, conjugated, (PREMARIN) 0.625 MG tablet Take 0.625 mg by mouth daily.   09/11/11  [provider]    Family History Family History  Problem Relation  Age of Onset  . Thyroid disease Mother   . Colon cancer Other        paternal and maternal grandfather  . Meniere's disease Other   . Colon cancer Father   . Colon cancer Brother   . Anesthesia problems Neg Hx   . Hypotension Neg Hx   . Malignant hyperthermia Neg Hx   . Pseudochol deficiency Neg Hx     Social History Social History   Tobacco Use  . Smoking status: Current Every Day Smoker    Packs/day: 0.50    Years: 20.00    Pack years: 10.00    Types: Cigarettes    Start date: 07/07/1981  . Smokeless tobacco: Never Used  Substance Use Topics  . Alcohol use: No    Comment: not since June 2012  . Drug use: No    Comment: patient denies any-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     Allergies   Amoxicillin and Tramadol   Review of Systems Review of Systems  Constitutional: Negative for appetite change and fatigue.  HENT: Negative for congestion, ear discharge and sinus pressure.        Right earache  Eyes: Negative for discharge.  Respiratory: Positive for cough.   Cardiovascular: Negative for chest pain.  Gastrointestinal: Negative for abdominal pain and diarrhea.  Genitourinary: Negative for frequency and hematuria.  Musculoskeletal: Negative for back pain.  Skin: Negative for rash.  Neurological: Negative for seizures and headaches.  Psychiatric/Behavioral: Negative for hallucinations.  Patient complains of cough and right earache for couple days   Physical Exam Updated Vital Signs BP 100/78   Pulse 100   Temp 98.6 F (37 C) (Oral)   Resp 20   Ht 5\' 6"  (1.676 m)   Wt 83.9 kg   SpO2 97%   BMI 29.86 kg/m   Physical Exam Vitals signs and nursing note reviewed.  Constitutional:      Appearance: She is well-developed.  HENT:     Head: Normocephalic.     Comments: Mildly inflamed right TM    Nose: Nose normal.  Eyes:     General: No scleral icterus.    Conjunctiva/sclera: Conjunctivae normal.  Neck:     Musculoskeletal: Neck supple.      Thyroid: No thyromegaly.  Cardiovascular:     Rate and Rhythm: Normal rate and regular rhythm.     Heart sounds: No murmur. No friction rub. No gallop.   Pulmonary:  Breath sounds: No stridor. No wheezing or rales.  Chest:     Chest wall: No tenderness.  Abdominal:     General: There is no distension.     Tenderness: There is no abdominal tenderness. There is no rebound.  Musculoskeletal: Normal range of motion.  Lymphadenopathy:     Cervical: No cervical adenopathy.  Skin:    Findings: No erythema or rash.  Neurological:     Mental Status: She is alert and oriented to person, place, and time.     Motor: No abnormal muscle tone.     Coordination: Coordination normal.  Psychiatric:        Behavior: Behavior normal.      ED Treatments / Results  Labs (all labs ordered are listed, but only abnormal results are displayed) Labs Reviewed  NOVEL CORONAVIRUS, NAA (HOSPITAL ORDER, SEND-OUT TO REF LAB)    EKG None  Radiology Dg Chest Port 1 View  Result Date: 02/16/2019 CLINICAL DATA:  Cough EXAM: PORTABLE CHEST 1 VIEW COMPARISON:  August 28, 2015 chest radiograph and CT angiogram chest FINDINGS: Lungs are clear. Heart size and pulmonary vascularity are normal. No adenopathy. No bone lesions. IMPRESSION: No edema or consolidation. Electronically Signed   By: Lowella Grip III M.D.   On: 02/16/2019 10:54    Procedures Procedures (including critical care time)  Medications Ordered in ED Medications - No data to display   Initial Impression / Assessment and Plan / ED Course  I have reviewed the triage vital signs and the nursing notes.  Pertinent labs & imaging results that were available during my care of the patient were reviewed by me and considered in my medical decision making (see chart for details).    Allison Bolton was evaluated in Emergency Department on 02/16/2019 for the symptoms described in the history of present illness. She was evaluated in the  context of the global COVID-19 pandemic, which necessitated consideration that the patient might be at risk for infection with the SARS-CoV-2 virus that causes COVID-19. Institutional protocols and algorithms that pertain to the evaluation of patients at risk for COVID-19 are in a state of rapid change based on information released by regulatory bodies including the CDC and federal and state organizations. These policies and algorithms were followed during the patient's care in the ED.  Chest x-ray unremarkable.  Patient is treated with Bactrim for her cough and ear infection.  She also had a COVID test sent.  She will follow-up with her doctor next week      Final Clinical Impressions(s) / ED Diagnoses   Final diagnoses:  Cough    ED Discharge Orders         Ordered    sulfamethoxazole-trimethoprim (BACTRIM DS) 800-160 MG tablet  2 times daily     02/16/19 1213           Milton Ferguson, MD 02/16/19 1216

## 2019-02-16 NOTE — ED Triage Notes (Signed)
PT c/o congested non-productive cough and bilateral ear fullness feeling with no fever x3 days and no fevers.

## 2019-02-16 NOTE — Discharge Instructions (Addendum)
Follow-up with your doctor next week for recheck 

## 2019-02-17 LAB — NOVEL CORONAVIRUS, NAA (HOSP ORDER, SEND-OUT TO REF LAB; TAT 18-24 HRS): SARS-CoV-2, NAA: NOT DETECTED

## 2019-02-21 ENCOUNTER — Telehealth: Payer: Self-pay

## 2019-02-21 NOTE — Telephone Encounter (Signed)
Patient called to check Covid results.  Advised she is negative.

## 2019-04-25 ENCOUNTER — Encounter (HOSPITAL_COMMUNITY): Payer: Self-pay | Admitting: Emergency Medicine

## 2019-04-25 ENCOUNTER — Emergency Department (HOSPITAL_COMMUNITY)
Admission: EM | Admit: 2019-04-25 | Discharge: 2019-04-25 | Disposition: A | Discharge status: Home / Self Care | Payer: Medicare Other | Attending: Emergency Medicine | Admitting: Emergency Medicine

## 2019-04-25 ENCOUNTER — Other Ambulatory Visit: Payer: Self-pay

## 2019-04-25 DIAGNOSIS — G43809 Other migraine, not intractable, without status migrainosus: Secondary | ICD-10-CM | POA: Diagnosis not present

## 2019-04-25 DIAGNOSIS — Z79899 Other long term (current) drug therapy: Secondary | ICD-10-CM | POA: Insufficient documentation

## 2019-04-25 DIAGNOSIS — R519 Headache, unspecified: Secondary | ICD-10-CM | POA: Diagnosis present

## 2019-04-25 DIAGNOSIS — F17210 Nicotine dependence, cigarettes, uncomplicated: Secondary | ICD-10-CM | POA: Insufficient documentation

## 2019-04-25 MED ORDER — KETOROLAC TROMETHAMINE 30 MG/ML IJ SOLN
30.0000 mg | Freq: Once | INTRAMUSCULAR | Status: AC
  Administered 2019-04-25: 10:00:00 30 mg via INTRAVENOUS
  Filled 2019-04-25: qty 1

## 2019-04-25 MED ORDER — SODIUM CHLORIDE 0.9 % IV BOLUS
1000.0000 mL | Freq: Once | INTRAVENOUS | Status: AC
  Administered 2019-04-25: 1000 mL via INTRAVENOUS

## 2019-04-25 MED ORDER — DIPHENHYDRAMINE HCL 50 MG/ML IJ SOLN
25.0000 mg | Freq: Once | INTRAMUSCULAR | Status: AC
  Administered 2019-04-25: 25 mg via INTRAVENOUS
  Filled 2019-04-25: qty 1

## 2019-04-25 MED ORDER — PROCHLORPERAZINE EDISYLATE 10 MG/2ML IJ SOLN
10.0000 mg | Freq: Once | INTRAMUSCULAR | Status: AC
  Administered 2019-04-25: 10:00:00 10 mg via INTRAVENOUS
  Filled 2019-04-25: qty 2

## 2019-04-25 NOTE — ED Notes (Signed)
Pt states she is feeling much better and would like to go home.

## 2019-04-25 NOTE — ED Triage Notes (Addendum)
PT brought in by [facility] for headache, nausea/vomiting x6 in the past 24 hours, and RUQ abdominal pain x2 days. PT states she drove back from [place] x2 days ago but unaware of any contact with anyone that was sick. PT states she took Topamax/Excedrin last night without relief.

## 2019-04-25 NOTE — Discharge Instructions (Signed)
Please take your home medications exactly as prescribed, you may return for any severe or worsening symptoms, otherwise follow-up with your family doctor for recheck in 2 to 3 days
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
## 2019-04-25 NOTE — ED Notes (Signed)
Pt sleeping at this time.

## 2019-05-02 ENCOUNTER — Encounter (HOSPITAL_COMMUNITY): Payer: Self-pay

## 2019-05-02 ENCOUNTER — Other Ambulatory Visit: Payer: Self-pay

## 2019-05-02 DIAGNOSIS — F419 Anxiety disorder, unspecified: Secondary | ICD-10-CM | POA: Diagnosis not present

## 2019-05-02 DIAGNOSIS — Z5321 Procedure and treatment not carried out due to patient leaving prior to being seen by health care provider: Secondary | ICD-10-CM | POA: Insufficient documentation

## 2019-05-02 DIAGNOSIS — K5900 Constipation, unspecified: Secondary | ICD-10-CM | POA: Diagnosis not present

## 2019-05-02 DIAGNOSIS — R1010 Upper abdominal pain, unspecified: Secondary | ICD-10-CM | POA: Diagnosis present

## 2019-05-02 NOTE — ED Triage Notes (Signed)
Pt reports her upper abdomen has been hurting for 2 days since she found out her daughter is pregnant. She feels constipated  And the urge to go but cant push the stool out. She also feels very anxious

## 2019-05-03 ENCOUNTER — Emergency Department (HOSPITAL_COMMUNITY)
Admission: EM | Admit: 2019-05-03 | Discharge: 2019-05-03 | Disposition: A | Discharge status: Home / Self Care | Payer: Medicare Other | Attending: Emergency Medicine | Admitting: Emergency Medicine

## 2019-05-03 ENCOUNTER — Other Ambulatory Visit: Payer: Self-pay

## 2019-05-03 ENCOUNTER — Emergency Department (HOSPITAL_COMMUNITY): Payer: Medicare Other

## 2019-05-03 ENCOUNTER — Encounter (HOSPITAL_COMMUNITY): Payer: Self-pay

## 2019-05-03 ENCOUNTER — Emergency Department (HOSPITAL_COMMUNITY)
Admission: EM | Admit: 2019-05-03 | Discharge: 2019-05-04 | Disposition: A | Discharge status: Home / Self Care | Payer: Medicare Other | Source: Home / Self Care | Attending: Emergency Medicine | Admitting: Emergency Medicine

## 2019-05-03 DIAGNOSIS — R55 Syncope and collapse: Secondary | ICD-10-CM | POA: Insufficient documentation

## 2019-05-03 DIAGNOSIS — F17210 Nicotine dependence, cigarettes, uncomplicated: Secondary | ICD-10-CM | POA: Insufficient documentation

## 2019-05-03 DIAGNOSIS — Z79899 Other long term (current) drug therapy: Secondary | ICD-10-CM | POA: Insufficient documentation

## 2019-05-03 DIAGNOSIS — R519 Headache, unspecified: Secondary | ICD-10-CM | POA: Insufficient documentation

## 2019-05-03 DIAGNOSIS — M25532 Pain in left wrist: Secondary | ICD-10-CM | POA: Insufficient documentation

## 2019-05-03 DIAGNOSIS — M545 Low back pain, unspecified: Secondary | ICD-10-CM

## 2019-05-03 DIAGNOSIS — Y9389 Activity, other specified: Secondary | ICD-10-CM | POA: Insufficient documentation

## 2019-05-03 DIAGNOSIS — M542 Cervicalgia: Secondary | ICD-10-CM | POA: Insufficient documentation

## 2019-05-03 DIAGNOSIS — Z5321 Procedure and treatment not carried out due to patient leaving prior to being seen by health care provider: Secondary | ICD-10-CM | POA: Diagnosis not present

## 2019-05-03 DIAGNOSIS — W19XXXA Unspecified fall, initial encounter: Secondary | ICD-10-CM

## 2019-05-03 DIAGNOSIS — Y998 Other external cause status: Secondary | ICD-10-CM | POA: Insufficient documentation

## 2019-05-03 DIAGNOSIS — Y92018 Other place in single-family (private) house as the place of occurrence of the external cause: Secondary | ICD-10-CM | POA: Insufficient documentation

## 2019-05-03 DIAGNOSIS — W108XXA Fall (on) (from) other stairs and steps, initial encounter: Secondary | ICD-10-CM | POA: Insufficient documentation

## 2019-05-03 MED ORDER — OXYCODONE-ACETAMINOPHEN 5-325 MG PO TABS
1.0000 | ORAL_TABLET | Freq: Once | ORAL | Status: AC
  Administered 2019-05-03: 1 via ORAL
  Filled 2019-05-03: qty 1
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
## 2019-05-04 MED ORDER — NAPROXEN 500 MG PO TABS: 500.0000 mg | ORAL_TABLET | Freq: Two times a day (BID) | ORAL | 0 refills | Status: DC

## 2019-05-04 NOTE — Discharge Instructions (Addendum)
You were seen today after a fall.  Your imaging is negative for acute fracture or other abnormality.  Take naproxen as needed for pain.

## 2019-05-04 NOTE — ED Notes (Signed)
Pt ambulated without assistance and is eating cracker and drinking sprite at this time.

## 2019-05-07 ENCOUNTER — Encounter (HOSPITAL_COMMUNITY): Payer: Self-pay | Admitting: *Deleted

## 2019-05-07 ENCOUNTER — Emergency Department (HOSPITAL_COMMUNITY): Payer: Medicare Other

## 2019-05-07 ENCOUNTER — Other Ambulatory Visit: Payer: Self-pay

## 2019-05-07 ENCOUNTER — Emergency Department (HOSPITAL_COMMUNITY)
Admission: EM | Admit: 2019-05-07 | Discharge: 2019-05-07 | Disposition: A | Discharge status: Home / Self Care | Payer: Medicare Other | Attending: Emergency Medicine | Admitting: Emergency Medicine

## 2019-05-07 DIAGNOSIS — N200 Calculus of kidney: Secondary | ICD-10-CM

## 2019-05-07 DIAGNOSIS — K7689 Other specified diseases of liver: Secondary | ICD-10-CM | POA: Diagnosis not present

## 2019-05-07 DIAGNOSIS — F17210 Nicotine dependence, cigarettes, uncomplicated: Secondary | ICD-10-CM | POA: Diagnosis not present

## 2019-05-07 DIAGNOSIS — J45909 Unspecified asthma, uncomplicated: Secondary | ICD-10-CM | POA: Diagnosis not present

## 2019-05-07 DIAGNOSIS — K5730 Diverticulosis of large intestine without perforation or abscess without bleeding: Secondary | ICD-10-CM | POA: Diagnosis not present

## 2019-05-07 DIAGNOSIS — K5790 Diverticulosis of intestine, part unspecified, without perforation or abscess without bleeding: Secondary | ICD-10-CM

## 2019-05-07 DIAGNOSIS — R112 Nausea with vomiting, unspecified: Secondary | ICD-10-CM | POA: Diagnosis not present

## 2019-05-07 DIAGNOSIS — R160 Hepatomegaly, not elsewhere classified: Secondary | ICD-10-CM

## 2019-05-07 DIAGNOSIS — R197 Diarrhea, unspecified: Secondary | ICD-10-CM | POA: Diagnosis not present

## 2019-05-07 DIAGNOSIS — R109 Unspecified abdominal pain: Secondary | ICD-10-CM | POA: Diagnosis present

## 2019-05-07 LAB — CBC WITH DIFFERENTIAL/PLATELET
Abs Immature Granulocytes: 0.03 10*3/uL (ref 0.00–0.07)
Basophils Absolute: 0.1 10*3/uL (ref 0.0–0.1)
Basophils Relative: 1 %
Eosinophils Absolute: 0.2 10*3/uL (ref 0.0–0.5)
Eosinophils Relative: 2 %
HCT: 44.2 % (ref 36.0–46.0)
Hemoglobin: 14.6 g/dL (ref 12.0–15.0)
Immature Granulocytes: 0 %
Lymphocytes Relative: 31 %
Lymphs Abs: 3 10*3/uL (ref 0.7–4.0)
MCH: 29 pg (ref 26.0–34.0)
MCHC: 33 g/dL (ref 30.0–36.0)
MCV: 87.9 fL (ref 80.0–100.0)
Monocytes Absolute: 0.9 10*3/uL (ref 0.1–1.0)
Monocytes Relative: 9 %
Neutro Abs: 5.6 10*3/uL (ref 1.7–7.7)
Neutrophils Relative %: 57 %
Platelets: 347 10*3/uL (ref 150–400)
RBC: 5.03 MIL/uL (ref 3.87–5.11)
RDW: 13.7 % (ref 11.5–15.5)
WBC: 9.7 10*3/uL (ref 4.0–10.5)
nRBC: 0 % (ref 0.0–0.2)

## 2019-05-07 LAB — I-STAT BETA HCG BLOOD, ED (MC, WL, AP ONLY): I-stat hCG, quantitative: 5.3 m[IU]/mL — ABNORMAL HIGH (ref <5)

## 2019-05-07 LAB — COMPREHENSIVE METABOLIC PANEL
ALT: 20 U/L (ref 0–44)
AST: 17 U/L (ref 15–41)
Albumin: 4.2 g/dL (ref 3.5–5.0)
Alkaline Phosphatase: 56 U/L (ref 38–126)
Anion gap: 6 (ref 5–15)
BUN: 12 mg/dL (ref 6–20)
CO2: 23 mmol/L (ref 22–32)
Calcium: 9.1 mg/dL (ref 8.9–10.3)
Chloride: 107 mmol/L (ref 98–111)
Creatinine, Ser: 0.72 mg/dL (ref 0.44–1.00)
GFR calc Af Amer: >60 mL/min (ref >60)
GFR calc non Af Amer: >60 mL/min (ref >60)
Glucose, Bld: 90 mg/dL (ref 70–99)
Potassium: 3.4 mmol/L — ABNORMAL LOW (ref 3.5–5.1)
Sodium: 136 mmol/L (ref 135–145)
Total Bilirubin: 0.6 mg/dL (ref 0.3–1.2)
Total Protein: 7 g/dL (ref 6.5–8.1)

## 2019-05-07 LAB — URINALYSIS, ROUTINE W REFLEX MICROSCOPIC
Bilirubin Urine: NEGATIVE
Glucose, UA: NEGATIVE mg/dL
Hgb urine dipstick: NEGATIVE
Ketones, ur: NEGATIVE mg/dL
Nitrite: NEGATIVE
Protein, ur: NEGATIVE mg/dL
Specific Gravity, Urine: 1.02 (ref 1.005–1.030)
pH: 5 (ref 5.0–8.0)

## 2019-05-07 LAB — PREGNANCY, URINE: Preg Test, Ur: NEGATIVE

## 2019-05-07 LAB — LIPASE, BLOOD: Lipase: 44 U/L (ref 11–51)

## 2019-05-07 LAB — ETHANOL: Alcohol, Ethyl (B): <10 mg/dL (ref <10)

## 2019-05-07 MED ORDER — SODIUM CHLORIDE 0.9 % IV BOLUS: 1000.0000 mL | Freq: Once | INTRAVENOUS | Status: DC

## 2019-05-07 MED ORDER — IOHEXOL 300 MG/ML  SOLN
100.0000 mL | Freq: Once | INTRAMUSCULAR | Status: AC | PRN
  Administered 2019-05-07: 20:00:00 100 mL via INTRAVENOUS

## 2019-05-07 NOTE — ED Triage Notes (Signed)
Pt c/o worsening abdominal pain, n/v/d for a couple of days. Pt reports she has had abdominal pain for years, but recently it has gotten worse. Pt reports she just recently diagnosed with stomach cancer but hasn't started any treatment for it yet.

## 2019-05-07 NOTE — Discharge Instructions (Signed)
Your CT scan today showed lesions in your liver concerning for metastatic disease or a cancer.  Please call at the oncologist office Dr. Delton Coombes as soon as possible to schedule a follow-up appointment regarding these lesions.  Please return to the emergency department to complete your work-up as soon as possible.

## 2019-05-07 NOTE — ED Provider Notes (Addendum)
[facility] EMERGENCY DEPARTMENT Provider Note   CSN: [id number] Arrival date & time: 05/07/19  1819     History   Chief Complaint Chief Complaint  Patient presents with  . Abdominal Pain    HPI Allison Bolton is a 48 y.o. female with history of fibromyalgia, bipolar, Mnire's disease presents today for abdominal pain, nausea/vomiting and diarrhea ongoing for several months.  Patient reports [place] "stomach [place]" reports that she did not have any imaging done for this and was told by an unknown doctor.  She reports her abdominal pain is a severe constant throbbing sensation without clear aggravating or alleviating factors, no radiation.  When asked what changed over the last day to bring patient to this ER she states the pain had gotten worse compared to previous months.  She reports nausea and occasional nonbloody/nonbilious emesis over the past several days as well.  Denies fever/chills, headache/vision changes, neck pain, chest pain/shortness of breath, dysuria/hematuria, vaginal discharge/bleeding, pelvic pain, fall/injury, diarrhea or any additional concerns.    HPI  Past Medical History:  Diagnosis Date  . Arthritis   . Asthma   . Bipolar 1 disorder ([place])   . Diverticulosis   . Dysrhythmia    sts "I have heart palpitations"  . Fibromyalgia   . H/O hiatal hernia    4  . Headache(784.0)   . Meniere's disease   . S/P colonoscopy    Dr. Laural Golden 2010: few small diverticula at sigmoid. otherwise normal.   . Shortness of breath     Patient Active Problem List   Diagnosis Date Noted  . Chest pain 07/18/2013  . Bipolar I disorder, most recent episode (or current) depressed, severe, without mention of psychotic behavior 05/13/2013  . Smoking 05/01/2012  . Generalized anxiety disorder 06/27/2011  . Liver lesion 02/04/2011    Past Surgical History:  Procedure Laterality Date  . ABDOMINAL HYSTERECTOMY    . APPENDECTOMY    .  CESAREAN SECTION    . CHOLECYSTECTOMY    . exploratory laparoscopy    . FOOT SURGERY    . HEMORRHOID SURGERY    . LAPAROSCOPIC APPENDECTOMY  02/19/2011   Procedure: APPENDECTOMY LAPAROSCOPIC;  Surgeon: Jamesetta So;  Location: [facility];  Service: General;  Laterality: N/A;  . LAPAROSCOPY  02/19/2011   Procedure: LAPAROSCOPY DIAGNOSTIC;  Surgeon: Jamesetta So;  Location: [facility];  Service: General;  Laterality: N/A;  . left ovarian removal    . multiple hernia repairs    . Right ovarian removal    . TONSILLECTOMY    . TONSILLECTOMY AND ADENOIDECTOMY    . tubes in ears    . UMBILICAL HERNIA REPAIR  Dec 2011   Dr. Arnoldo Morale  . wisdom teeth removal       OB History    Gravida  5   Para  2   Term  2   Preterm      AB  3   Living  2     SAB  2   TAB      Ectopic      Multiple      Live Births               Home Medications    Prior to Admission medications   Medication Sig Start Date End Date Taking? Authorizing Provider  gabapentin (NEURONTIN) 300 MG capsule TAKE 1 CAPSULE BY MOUTH THREE TIMES A DAY 02/08/19  Yes [provider]  lamoTRIgine (LAMICTAL) 200 MG tablet Take 200 mg by mouth daily.   Yes [provider]  LATUDA 80 MG TABS tablet Take 0.5 tablets by mouth 2 (two) times daily. 03/07/19  Yes [provider]  omeprazole (PRILOSEC) 20 MG capsule Take 20 mg by mouth daily.   Yes [provider]  QUEtiapine (SEROQUEL) 200 MG tablet Take 350 mg by mouth at bedtime.    Yes [provider]  topiramate (TOPAMAX) 25 MG tablet Take 25 mg by mouth daily. 05/02/19  Yes [provider]  traZODone (DESYREL) 100 MG tablet Take 100 mg by mouth at bedtime. 03/10/19  Yes [provider]  acetaminophen (TYLENOL) 325 MG tablet Take 650 mg by mouth every 6 (six) hours as needed.    [provider]  naproxen (NAPROSYN) 500 MG tablet Take 1 tablet (500 mg total) by mouth 2 (two) times daily. 05/04/19   Horton,  Barbette Hair, MD  albuterol (PROVENTIL) (2.5 MG/3ML) 0.083% nebulizer solution Take 2.5 mg by nebulization every 6 (six) hours as needed. For wheezing, allergies, and/or shortness of breath  09/11/11  [provider]  estrogens, conjugated, (PREMARIN) 0.625 MG tablet Take 0.625 mg by mouth daily.   09/11/11  [provider]    Family History Family History  Problem Relation Age of Onset  . Thyroid disease Mother   . Colon cancer Other        paternal and maternal grandfather  . Meniere's disease Other   . Colon cancer Father   . Colon cancer Brother   . Anesthesia problems Neg Hx   . Hypotension Neg Hx   . Malignant hyperthermia Neg Hx   . Pseudochol deficiency Neg Hx     Social History Social History   Tobacco Use  . Smoking status: Current Every Day Smoker    Packs/day: 0.50    Years: 20.00    Pack years: 10.00    Types: Cigarettes    Start date: 07/07/1981  . Smokeless tobacco: Never Used  Substance Use Topics  . Alcohol use: No    Comment: not since June 2012  . Drug use: No    Comment: patient denies any-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     Allergies   Amoxicillin and Tramadol   Review of Systems Review of Systems Ten systems are reviewed and are negative for acute change except as noted in the HPI   Physical Exam Updated Vital Signs BP (!) 127/96 (BP Location: Right Arm)   Pulse 81   Temp (!) 97.4 F (36.3 C) (Oral)   Resp 15   Ht 5\' 6"  (1.676 m)   Wt 90.7 kg   SpO2 97%   BMI 32.28 kg/m   Physical Exam Constitutional:      General: She is not in acute distress.    Appearance: Normal appearance. She is well-developed. She is not ill-appearing or diaphoretic.  HENT:     Head: Normocephalic and atraumatic.     Right Ear: External ear normal.     Left Ear: External ear normal.     Nose: Nose normal.  Eyes:     General: Vision grossly intact. Gaze aligned appropriately.     Pupils: Pupils are equal, round, and reactive to  light.  Neck:     Musculoskeletal: Normal range of motion.     Trachea: Trachea and phonation normal. No tracheal deviation.  Pulmonary:     Effort: Pulmonary effort is normal. No  respiratory distress.  Abdominal:     General: There is no distension.     Palpations: Abdomen is soft.     Tenderness: There is generalized abdominal tenderness. There is no guarding or rebound.  Musculoskeletal: Normal range of motion.  Skin:    General: Skin is warm and dry.  Neurological:     Mental Status: She is alert.     GCS: GCS eye subscore is 4. GCS verbal subscore is 5. GCS motor subscore is 6.     Comments: Speech is clear and goal oriented, follows commands Major Cranial nerves without deficit, no facial droop Moves extremities without ataxia, coordination intact  Psychiatric:        Behavior: Behavior normal.      ED Treatments / Results  Labs (all labs ordered are listed, but only abnormal results are displayed) Labs Reviewed  COMPREHENSIVE METABOLIC PANEL - Abnormal; Notable for the following components:      Result Value   Potassium 3.4 (*)    All other components within normal limits  URINALYSIS, ROUTINE W REFLEX MICROSCOPIC - Abnormal; Notable for the following components:   APPearance HAZY (*)    Leukocytes,Ua TRACE (*)    Bacteria, UA RARE (*)    All other components within normal limits  I-STAT BETA HCG BLOOD, ED ([facility], [facility], [facility] ONLY) - Abnormal; Notable for the following components:   I-stat hCG, quantitative 5.3 (*)    All other components within normal limits  CBC WITH DIFFERENTIAL/PLATELET  LIPASE, BLOOD  ETHANOL  PREGNANCY, URINE    EKG None  Radiology Ct Abdomen Pelvis W Contrast  Result Date: 05/07/2019 CLINICAL DATA:  Worsening abdominal pain, nausea, vomiting and diarrhea for couple days, 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 Sagittal and coronal MPR images reconstructed from axial data set. Respiratory motion artifacts degrade exam. CONTRAST:  154mL OMNIPAQUE IOHEXOL 300 MG/ML SOLN IV. No oral contrast. COMPARISON:  Coronal images from a prior abdominal CT of 01/18/2013 FINDINGS: Lower chest: Subsegmental atelectasis at base of RIGHT middle lobe. Hepatobiliary: 3 poorly defined low-attenuation lesions are seen within the liver, nonspecific, metastatic disease not excluded. These include a 16 x 11 mm diameter nodule at the medial segment LEFT lobe image 22, 9 mm nodule lateral segment LEFT lobe image 27, and 13 x 10 mm nodule at lateral segment LEFT lobe image 36. Gallbladder surgically absent. No biliary dilatation. Pancreas: Normal appearance Spleen: Normal appearance Adrenals/Urinary Tract: Adrenal glands normal appearance. Question tiny nonobstructing BILATERAL renal calculi. Kidneys, ureters and bladder otherwise normal appearance. Stomach/Bowel: Prior appendectomy. Few scattered diverticula of descending and sigmoid colon without evidence of diverticulitis. This bowel loops otherwise unremarkable. Stomach contains food debris but is otherwise normal appearance. No gastric mass identified. Vascular/Lymphatic: Aorta normal caliber. Vascular structures grossly patent. No adenopathy. Scattered pelvic phleboliths. Reproductive: Uterus surgically absent with nonvisualization of ovaries Other: No free air or free fluid. No hernia or inflammatory process. Musculoskeletal: Bones demineralized. IMPRESSION: Three poorly defined low-attenuation hepatic lesions, nonspecific but concerning for metastatic disease. No primary tumor identified. Tiny BILATERAL nonobstructing renal calculi. Minimal sigmoid diverticulosis. Electronically Signed   By: Lavonia Dana M.D.   On: 05/07/2019 21:13    Procedures Procedures (including  critical care time)  Medications Ordered in ED Medications  sodium chloride 0.9 % bolus  1,000 mL (has no administration in time range)  iohexol (OMNIPAQUE) 300 MG/ML solution 100 mL (100 mLs Intravenous Contrast Given 05/07/19 2026)     Initial Impression / Assessment and Plan / ED Course  I have reviewed the triage vital signs and the nursing notes.  Pertinent labs & imaging results that were available during my care of the patient were reviewed by me and considered in my medical decision making (see chart for details).     After blood work and imaging was ordered it appears that patient related to RN that she had a full work-up done at an outside facility yesterday including CT scans, she denied this to me earlier.  I am unable to access the records of the outside facility, [facility] through our EMR system. - Beta-hCG of 5.3, suspect false positive Follow-up urine pregnancy test negative Lipase within normal limits CMP nonacute Urinalysis with leukocytes, WBCs, bacteria and multiple squamous cells, suspect contamination, no urinary symptoms doubt UTI Ethanol negative CBC within normal limits - After patient returned from CT scan she eloped from the emergency department.  Per RN patient requesting to leave to smoke and then decided to completely leave the facility, she was signed out [place].  I was not informed of these events until after patient had left the emergency department. - CTAP:  IMPRESSION:  Three poorly defined low-attenuation hepatic lesions, nonspecific  but concerning for metastatic disease.    No primary tumor identified.    Tiny BILATERAL nonobstructing renal calculi.    Minimal sigmoid diverticulosis.  - I attempted to call patient on her listed phone number of [phone], this phone is not connected.  I am unable to reach the patient to inform her of her CT scan results.  I will place a referral to oncology in her disposition which will be available through  her MyChart account.  Charge RN Ginger informed of situation and need oncology follow-up for her hepatic lesions.  Patient has eloped from the emergency department today.  Note: Portions of this report may have been transcribed using voice recognition software. Every effort was made to ensure accuracy; however, inadvertent computerized transcription errors may still be present. Final Clinical Impressions(s) / ED Diagnoses   Final diagnoses:  Liver mass  Nephrolithiasis  Diverticulosis    ED Discharge Orders    None       Gari Crown 05/07/19 2132    Wyvonnia Dusky, MD 05/08/19 1034    Deliah Boston, PA-C 05/14/19 1706    Wyvonnia Dusky, MD 05/18/19 [phone]

## 2019-05-07 NOTE — ED Notes (Signed)
Pt has been out to nursing desk several times asking different RN's about going to smoke, each RN explained to pt that she would have to sign out to go outside due to having an iv in place, pt now comes to nursing desk and states " I want to leave" RN walked pt back to room and explained to pt that she should stay for treatment, that she was seen at [facility] last night for same and had ct scan done as well, RN also explained to pt that risk of leaving the er ama. Pt expressed understanding of decisions and consequences of ama decision, ama form signed by pt,

## 2019-05-08 ENCOUNTER — Encounter (HOSPITAL_COMMUNITY): Payer: Self-pay | Admitting: Emergency Medicine

## 2019-05-08 ENCOUNTER — Other Ambulatory Visit: Payer: Self-pay

## 2019-05-08 ENCOUNTER — Emergency Department (HOSPITAL_COMMUNITY)
Admission: EM | Admit: 2019-05-08 | Discharge: 2019-05-10 | Disposition: A | Discharge status: Other Acute Inpatient Hospital | Payer: Medicare Other | Source: Home / Self Care | Attending: Emergency Medicine | Admitting: Emergency Medicine

## 2019-05-08 DIAGNOSIS — F17210 Nicotine dependence, cigarettes, uncomplicated: Secondary | ICD-10-CM | POA: Insufficient documentation

## 2019-05-08 DIAGNOSIS — Z20828 Contact with and (suspected) exposure to other viral communicable diseases: Secondary | ICD-10-CM | POA: Insufficient documentation

## 2019-05-08 DIAGNOSIS — R1010 Upper abdominal pain, unspecified: Secondary | ICD-10-CM

## 2019-05-08 DIAGNOSIS — F312 Bipolar disorder, current episode manic severe with psychotic features: Secondary | ICD-10-CM | POA: Diagnosis not present

## 2019-05-08 DIAGNOSIS — J45909 Unspecified asthma, uncomplicated: Secondary | ICD-10-CM | POA: Insufficient documentation

## 2019-05-08 DIAGNOSIS — R462 Strange and inexplicable behavior: Secondary | ICD-10-CM

## 2019-05-08 DIAGNOSIS — Z79899 Other long term (current) drug therapy: Secondary | ICD-10-CM | POA: Insufficient documentation

## 2019-05-08 DIAGNOSIS — F319 Bipolar disorder, unspecified: Secondary | ICD-10-CM | POA: Insufficient documentation

## 2019-05-08 DIAGNOSIS — K219 Gastro-esophageal reflux disease without esophagitis: Secondary | ICD-10-CM | POA: Insufficient documentation

## 2019-05-08 LAB — URINALYSIS, ROUTINE W REFLEX MICROSCOPIC
Bilirubin Urine: NEGATIVE
Glucose, UA: NEGATIVE mg/dL
Hgb urine dipstick: NEGATIVE
Ketones, ur: NEGATIVE mg/dL
Leukocytes,Ua: NEGATIVE
Nitrite: NEGATIVE
Protein, ur: NEGATIVE mg/dL
Specific Gravity, Urine: 1.019 (ref 1.005–1.030)
pH: 5 (ref 5.0–8.0)

## 2019-05-08 LAB — RAPID URINE DRUG SCREEN, HOSP PERFORMED
Amphetamines: NOT DETECTED
Barbiturates: NOT DETECTED
Benzodiazepines: NOT DETECTED
Cocaine: NOT DETECTED
Opiates: NOT DETECTED
Tetrahydrocannabinol: NOT DETECTED

## 2019-05-08 LAB — AMMONIA: Ammonia: 29 umol/L (ref 9–35)

## 2019-05-08 MED ORDER — FAMOTIDINE IN NACL 20-0.9 MG/50ML-% IV SOLN
20.0000 mg | Freq: Once | INTRAVENOUS | Status: AC
  Administered 2019-05-08: 20 mg via INTRAVENOUS
  Filled 2019-05-08: qty 50

## 2019-05-08 MED ORDER — ONDANSETRON HCL 4 MG/2ML IJ SOLN
4.0000 mg | Freq: Once | INTRAMUSCULAR | Status: AC
  Administered 2019-05-08: 4 mg via INTRAVENOUS
  Filled 2019-05-08: qty 2

## 2019-05-08 MED ORDER — ONDANSETRON HCL 4 MG PO TABS
4.0000 mg | ORAL_TABLET | Freq: Three times a day (TID) | ORAL | Status: DC | PRN
  Administered 2019-05-09: 4 mg via ORAL
  Filled 2019-05-08: qty 1

## 2019-05-08 MED ORDER — ZOLPIDEM TARTRATE 5 MG PO TABS
5.0000 mg | ORAL_TABLET | Freq: Every evening | ORAL | Status: DC | PRN
  Administered 2019-05-08 – 2019-05-09 (×2): 5 mg via ORAL
  Filled 2019-05-08 (×2): qty 1

## 2019-05-08 MED ORDER — ACETAMINOPHEN 325 MG PO TABS: 650.0000 mg | ORAL_TABLET | ORAL | Status: DC | PRN

## 2019-05-08 MED ORDER — ALUM & MAG HYDROXIDE-SIMETH 200-200-20 MG/5ML PO SUSP
30.0000 mL | Freq: Four times a day (QID) | ORAL | Status: DC | PRN
  Administered 2019-05-08: 30 mL via ORAL
  Filled 2019-05-08: qty 30

## 2019-05-08 MED ORDER — NICOTINE 21 MG/24HR TD PT24
21.0000 mg | MEDICATED_PATCH | Freq: Every day | TRANSDERMAL | Status: DC
  Administered 2019-05-08 – 2019-05-09 (×2): 21 mg via TRANSDERMAL
  Filled 2019-05-08 (×2): qty 1

## 2019-05-08 NOTE — BH Assessment (Signed)
Tele Assessment Note   Patient Name: Allison Bolton MRN: [id number] Referring Physician: Dr. Nat Christen, MD Location of Patient: [facility] Location of Provider: [facility]  Allison Bolton is a 48 y.o. female who came to [facility] due to, according to the nursing note, "generalized pain... on and off for liver and stomach cancer." According to notes, pt was also seen yesterday but left AMA prior to receiving treatment. Pt reports to clinician that she came to the ED because, "I'm stressed out because my daughter is pregnant and I'maway from her because she lives in [place]. I'm dying of cancer. I [facility] of [facility] My heart has been broken; I'm sick of my heart hurting." Clinician inquired if this was the same reason as to why pt came to the ED last night and she reported that it was (though, from reading notes, it appears pt came to the ED last night due to abdominal pain in addition to n/v/d).  Pt denies SI or a plan to kill herself. She acknowledges she has a hx of attempts to kill herself from 14 years ago. Pt states she has been hospitalized several times but cannot remember exactly where, with the exception of [facility] in [place]. Pt denies HI, AVH, NSSIB, access to guns/weapons, engagement with the legal system, and SA.  Pt was a questionable historian, as she was able to provide some information that appeared to be accurate, though she also provided information that is most likely delusional (she stated she is concerned that she has been followed from State-to-State since 1971; she states the gentleman following her also [place] numbers and takes people's money). However, pt was born in 1972, so this information is most likely not possible. Pt also shared that she has had 3 sisters and one brother addicted to drugs and alcohol and all die from killing themselves, and she currently lives with a brother and sister;  this could be possible, though not probable.  Pt is oriented x4. Her recent and remote memory is intact, though pt shared some ideas that are most likely delusions (as noted above). Pt was cooperative throughout the assessment process. Pt's insight, judgement, and impulse control is impaired at this time.   Diagnosis: F31.9, Bipolar I disorder, Current or most recent episode unspecified   Past Medical History:  Past Medical History:  Diagnosis Date  . Arthritis   . Asthma   . Bipolar 1 disorder ([place])   . Diverticulosis   . Dysrhythmia    sts "I have heart palpitations"  . Fibromyalgia   . H/O hiatal hernia    4  . Headache(784.0)   . Meniere's disease   . S/P colonoscopy    Dr. Laural Golden 2010: few small diverticula at sigmoid. otherwise normal.   . Shortness of breath     Past Surgical History:  Procedure Laterality Date  . ABDOMINAL HYSTERECTOMY    . APPENDECTOMY    . CESAREAN SECTION    . CHOLECYSTECTOMY    . exploratory laparoscopy    . FOOT SURGERY    . HEMORRHOID SURGERY    . LAPAROSCOPIC APPENDECTOMY  02/19/2011   Procedure: APPENDECTOMY LAPAROSCOPIC;  Surgeon: Jamesetta So;  Location: [facility];  Service: General;  Laterality: N/A;  . LAPAROSCOPY  02/19/2011   Procedure: LAPAROSCOPY DIAGNOSTIC;  Surgeon: Jamesetta So;  Location: [facility];  Service: General;  Laterality: N/A;  . left ovarian removal    .  multiple hernia repairs    . Right ovarian removal    . TONSILLECTOMY    . TONSILLECTOMY AND ADENOIDECTOMY    . tubes in ears    . UMBILICAL HERNIA REPAIR  Dec 2011   Dr. Arnoldo Morale  . wisdom teeth removal      Family History:  Family History  Problem Relation Age of Onset  . Thyroid disease Mother   . Colon cancer Other        paternal and maternal grandfather  . Meniere's disease Other   . Colon cancer Father   . Colon cancer Brother   . Anesthesia problems Neg Hx   . Hypotension Neg Hx   . Malignant hyperthermia Neg Hx   . Pseudochol deficiency Neg Hx      Social History:  reports that she has been smoking cigarettes. She started smoking about 37 years ago. She has a 10.00 pack-year smoking history. She has never used smokeless tobacco. She reports that she does not drink alcohol or use drugs.  Additional Social History:  Alcohol / Drug Use Pain Medications: Please see MAR Prescriptions: Please see MAR Over the Counter: Please see MAR History of alcohol / drug use?: No history of alcohol / drug abuse Longest period of sobriety (when/how long): Pt denies SA  CIWA: CIWA-Ar BP: (!) 141/89 Pulse Rate: 78 COWS:    Allergies:  Allergies  Allergen Reactions  . Amoxicillin Other (See Comments)    Patient states she feels sunburnt when taking amoxicillin  . Tramadol Rash    Home Medications: (Not in a hospital admission)   OB/GYN Status:  No LMP recorded. Patient has had a hysterectomy.  General Assessment Data Location of Assessment: [facility] TTS Assessment: In system Is this a Tele or Face-to-Face Assessment?: Tele Assessment Is this an Initial Assessment or a Re-assessment for this encounter?: Initial Assessment Patient Accompanied by:: N/A Language Other than English: No Living Arrangements: Other (Comment)(Pt states she is living with her brother and sister) What gender do you identify as?: Female Marital status: Widowed [place] name: Allison Bolton Pregnancy Status: No Living Arrangements: Other relatives Can pt return to current living arrangement?: Yes Admission Status: Voluntary Is patient capable of signing voluntary admission?: Yes Referral Source: Self/Family/Friend Insurance type: [employer]     Crisis Care Plan Living Arrangements: Other relatives Legal Guardian: Other:(Self) Name of Psychiatrist: None Name of Therapist: None  Education Status Is patient currently in school?: No Is the patient employed, unemployed or receiving disability?: Receiving disability income  Risk to self with the past  6 months Suicidal Ideation: No Has patient been a risk to self within the past 6 months prior to admission? : No Suicidal Intent: No Has patient had any suicidal intent within the past 6 months prior to admission? : No Is patient at risk for suicide?: No Suicidal Plan?: No Has patient had any suicidal plan within the past 6 months prior to admission? : No Access to Means: No What has been your use of drugs/alcohol within the last 12 months?: Pt denies Previous Attempts/Gestures: Yes How many times?: (Pt states, "several" 14 years ago) Other Self Harm Risks: Pt appears to have delusional thoughts at this time Triggers for Past Attempts: Unknown Intentional Self Injurious Behavior: None Family Suicide History: Yes(Pt states 3 sisters and 1 brother have killed themselves) Recent stressful life event(s): Other (Comment)(Away from pregnant daughter, has cancer) Persecutory voices/beliefs?: No Depression: No Depression Symptoms: Feeling worthless/self pity Substance abuse history and/or treatment for substance  abuse?: Yes(+ UDAs: Amphetamines (3/18 & 5/18) and Barbituates (3/18)) Suicide prevention information given to non-admitted patients: Not applicable  Risk to Others within the past 6 months Homicidal Ideation: No Does patient have any lifetime risk of violence toward others beyond the six months prior to admission? : No Thoughts of Harm to Others: No Current Homicidal Intent: No Current Homicidal Plan: No Access to Homicidal Means: No Identified Victim: None noted History of harm to others?: No Assessment of Violence: None Noted Violent Behavior Description: None noted Does patient have access to weapons?: No(Pt denies access to guns/weapons) Criminal Charges Pending?: No Does patient have a court date: No Is patient on probation?: No  Psychosis Hallucinations: None noted Delusions: Persecutory  Mental Status Report Appearance/Hygiene: Disheveled Eye Contact: Good Motor  Activity: Hyperactivity Speech: Pressured, Loud Level of Consciousness: Alert Mood: Anxious, Apprehensive, Pleasant Affect: Anxious Anxiety Level: Moderate Thought Processes: Coherent Judgement: Partial Orientation: Person, Place, Time, Situation Obsessive Compulsive Thoughts/Behaviors: Minimal  Cognitive Functioning Concentration: Good Memory: Recent Intact, Remote Intact Is patient IDD: No Insight: Fair Impulse Control: Fair Appetite: Good Have you had any weight changes? : No Change Sleep: No Change Total Hours of Sleep: 10 Vegetative Symptoms: None  ADLScreening [facility] Assessment Services) Patient's cognitive ability adequate to safely complete daily activities?: Yes Patient able to express need for assistance with ADLs?: Yes Independently performs ADLs?: Yes (appropriate for developmental age)  Prior Inpatient Therapy Prior Inpatient Therapy: Yes Prior Therapy Dates: Multiple Prior Therapy Facilty/Provider(s): [facility] Reason for Treatment: SI  Prior Outpatient Therapy Prior Outpatient Therapy: No Does patient have an ACCT team?: Unknown Does patient have Intensive In-House Services?  : Unknown Does patient have [facility] services? : Unknown Does patient have P4CC services?: Unknown  ADL Screening (condition at time of admission) Patient's cognitive ability adequate to safely complete daily activities?: Yes Is the patient deaf or have difficulty hearing?: No Does the patient have difficulty seeing, even when wearing glasses/contacts?: No Does the patient have difficulty concentrating, remembering, or making decisions?: Yes Patient able to express need for assistance with ADLs?: Yes Does the patient have difficulty dressing or bathing?: No Independently performs ADLs?: Yes (appropriate for developmental age) Does the patient have difficulty walking or climbing stairs?: No Weakness of Legs: None Weakness of Arms/Hands: None  Home Assistive  Devices/Equipment Home Assistive Devices/Equipment: None  Therapy Consults (therapy consults require a physician order) PT Evaluation Needed: No OT Evalulation Needed: No SLP Evaluation Needed: No       Advance Directives (For Healthcare) Does Patient Have a Medical Advance Directive?: No Type of Advance Directive: Healthcare Power of Attorney, Living will Would patient like information on creating a medical advance directive?: No - Patient declined         Disposition: Priscille Loveless, NP, reviewed pt's chart and information and determined pt should be observed overnight for safety and stability and re-assessed tomorrow by psychiatry. This information was provided to pt's nurse, Rip Harbour, [place], at [phone].   Disposition Initial Assessment Completed for this Encounter: Yes Patient referred to: Other (Comment)(Pt will be observed overnight for safety and stability)  This service was provided via telemedicine using a 2-way, interactive audio and video technology.  Names of all persons participating in this telemedicine service and their role in this encounter. Name: Saranda 'Barbie' Rivkin Role: Patient  Name: Priscille Loveless Role: Nurse Practitioner  Name: Windell Hummingbird Role: Clinician    Dannielle Burn 05/08/2019 3:55 PM

## 2019-05-08 NOTE — ED Notes (Signed)
Dinner tray provided,

## 2019-05-08 NOTE — ED Notes (Signed)
Pt given dinner tray,  

## 2019-05-08 NOTE — ED Notes (Signed)
edp at bedside,

## 2019-05-08 NOTE — ED Notes (Signed)
Pt requesting something to help her sleep,

## 2019-05-08 NOTE — BH Assessment (Signed)
Pt's nurse, Rip Harbour, RN, called in regards to pt requesting to leave and an inquiry as to whether pt meets criteria for IVC. Clinician spoke with Priscille Loveless, NP, who reviewed pt's chart and information and determined that yes, pt does meet criteria for IVC due to her confusion and question as to whether pt is competent to make medical decisions for herself. This information was provided to pt's nurse.

## 2019-05-08 NOTE — ED Triage Notes (Addendum)
Pt reports generalized pain and has been receiving "chemo" on and off for liver and stomach cancer. Pt also reports history of MS. Pt states "I am scared to get treatment again." Pt does not remember date of last treatment.   Pt reports was seen for same yesterday and was told  "needed antibiotics" for GERD. Pt states she left prior to receiving abx.

## 2019-05-08 NOTE — ED Notes (Signed)
Pt came to nursing desk requesting iv to be removed, advised that she is ready to go, advised pt that she did not have discharge paperwork and RN removed the iv per pt request,

## 2019-05-08 NOTE — ED Notes (Signed)
Pt out to nursing desk, wandering the hallway, pt redirected back to room,

## 2019-05-08 NOTE — ED Notes (Signed)
RN spoke with Sam at [facility] who advised that pt would need to be observed overnight and re-evaluated in am and if pt refused to stay then she would need to be made IVC'd, [place] PA notified as well as pt, pt changed into paper scrubs.

## 2019-05-08 NOTE — ED Provider Notes (Signed)
[facility] EMERGENCY DEPARTMENT Provider Note   CSN: [id number] Arrival date & time: 05/08/19  1209     History   Chief Complaint Chief Complaint  Patient presents with  . Extremity Pain    HPI Allison Bolton is a 48 y.o. female with history of bipolar 1 disorder, fibromyalgia, Mnire's disease, GERD presents for evaluation of ongoing upper abdominal pain with nausea and vomiting.  She reports that she has had upper abdominal pain for 3 weeks with a few episodes of nonbloody nonbilious emesis daily.  Denies diarrhea, constipation, chest pain, shortness of breath, urinary symptoms, melena, or hematochezia.  Pain is burning, worsens with stress, improves laying flat and resting.  She takes omeprazole for it with improvement.  She tells me it feels like her usual acid reflux symptoms.  She was seen and evaluated in the ED yesterday for the symptoms but eloped prior to her provider reevaluating her and going over the results.  Her work-up yesterday was significant for 3 poorly defined low-attenuation hepatic lesions concerning for possible metastatic disease but no acute surgical abdominal pathology.  She had reported that she was diagnosed with stomach cancer 6 months ago in [place] but has not followed up with an oncologist or started treatment.  My assessment she is pleasant, alert and oriented x4 but with somewhat bizarre behavior.  For example when asked about drug use she denies any and states "I have not done any drugs since 1971, when my daughter was born.  People keep accusing me of drugs".  On chart review it appears her birthday is [birth year].  On chart review using care everywhere she has been seen at medical centers in [place], [place], and in [place] at [facility].  She has apparently had cervical cancer at the age of 30 and underwent total abdominal hysterectomy with bilateral salpingo-oophorectomy at age 73 for fibroids.  She has also had cholecystectomy and  appendectomy in her 20s.  CT scan performed yesterday does show that she is status post cholecystectomy, appendectomy, and hysterectomy.  Apparently her brother had a history of stomach cancer, both paternal and maternal grandfathers had colorectal cancer, and mother had liver disease.  She has had psychiatric admissions for psychosis and manic behavior previously.  She denies suicidal ideation or homicidal ideation to me.  Denies auditory or visual hallucinations.  She smokes a few cigarettes daily, denies alcohol or drug use.      The history is provided by the patient.    Past Medical History:  Diagnosis Date  . Arthritis   . Asthma   . Bipolar 1 disorder ([place])   . Diverticulosis   . Dysrhythmia    sts "I have heart palpitations"  . Fibromyalgia   . H/O hiatal hernia    4  . Headache(784.0)   . Meniere's disease   . S/P colonoscopy    Dr. Laural Golden 2010: few small diverticula at sigmoid. otherwise normal.   . Shortness of breath     Patient Active Problem List   Diagnosis Date Noted  . Chest pain 07/18/2013  . Bipolar I disorder, most recent episode (or current) depressed, severe, without mention of psychotic behavior 05/13/2013  . Smoking 05/01/2012  . Generalized anxiety disorder 06/27/2011  . Liver lesion 02/04/2011    Past Surgical History:  Procedure Laterality Date  . ABDOMINAL HYSTERECTOMY    . APPENDECTOMY    . CESAREAN SECTION    . CHOLECYSTECTOMY    . exploratory laparoscopy    .  FOOT SURGERY    . HEMORRHOID SURGERY    . LAPAROSCOPIC APPENDECTOMY  02/19/2011   Procedure: APPENDECTOMY LAPAROSCOPIC;  Surgeon: Jamesetta So;  Location: [facility];  Service: General;  Laterality: N/A;  . LAPAROSCOPY  02/19/2011   Procedure: LAPAROSCOPY DIAGNOSTIC;  Surgeon: Jamesetta So;  Location: [facility];  Service: General;  Laterality: N/A;  . left ovarian removal    . multiple hernia repairs    . Right ovarian removal    . TONSILLECTOMY    . TONSILLECTOMY AND ADENOIDECTOMY     . tubes in ears    . UMBILICAL HERNIA REPAIR  Dec 2011   Dr. Arnoldo Morale  . wisdom teeth removal       OB History    Gravida  5   Para  2   Term  2   Preterm      AB  3   Living  2     SAB  2   TAB      Ectopic      Multiple      Live Births               Home Medications    Prior to Admission medications   Medication Sig Start Date End Date Taking? Authorizing Provider  gabapentin (NEURONTIN) 300 MG capsule TAKE 1 CAPSULE BY MOUTH THREE TIMES A DAY 02/08/19  Yes [provider]  lamoTRIgine (LAMICTAL) 200 MG tablet Take 200 mg by mouth daily.   Yes [provider]  LATUDA 80 MG TABS tablet Take 0.5 tablets by mouth 2 (two) times daily. 03/07/19  Yes [provider]  omeprazole (PRILOSEC) 20 MG capsule Take 20 mg by mouth daily.   Yes [provider]  QUEtiapine (SEROQUEL) 200 MG tablet Take 350 mg by mouth at bedtime.    Yes [provider]  topiramate (TOPAMAX) 25 MG tablet Take 25 mg by mouth daily. 05/02/19  Yes [provider]  traZODone (DESYREL) 100 MG tablet Take 100 mg by mouth at bedtime. 03/10/19  Yes [provider]  acetaminophen (TYLENOL) 325 MG tablet Take 650 mg by mouth every 6 (six) hours as needed.    [provider]  naproxen (NAPROSYN) 500 MG tablet Take 1 tablet (500 mg total) by mouth 2 (two) times daily. 05/04/19   Horton, Barbette Hair, MD  albuterol (PROVENTIL) (2.5 MG/3ML) 0.083% nebulizer solution Take 2.5 mg by nebulization every 6 (six) hours as needed. For wheezing, allergies, and/or shortness of breath  09/11/11  [provider]  estrogens, conjugated, (PREMARIN) 0.625 MG tablet Take 0.625 mg by mouth daily.   09/11/11  [provider]    Family History Family History  Problem Relation Age of Onset  . Thyroid disease Mother   . Colon cancer Other        paternal and maternal grandfather  . Meniere's disease Other   . Colon cancer Father   . Colon  cancer Brother   . Anesthesia problems Neg Hx   . Hypotension Neg Hx   . Malignant hyperthermia Neg Hx   . Pseudochol deficiency Neg Hx     Social History Social History   Tobacco Use  . Smoking status: Current Every Day Smoker    Packs/day: 0.50    Years: 20.00    Pack years: 10.00    Types: Cigarettes    Start date: 07/07/1981  . Smokeless tobacco: Never Used  Substance Use Topics  . Alcohol use: No  Comment: not since June 2012  . Drug use: No    Comment: patient denies any-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  Respiratory: Negative for shortness of breath.   Cardiovascular: Negative for chest pain.  Gastrointestinal: Positive for abdominal pain, nausea and vomiting. Negative for constipation and diarrhea.  Psychiatric/Behavioral: Positive for confusion. Negative for hallucinations and suicidal ideas.  All other systems reviewed and are negative.    Physical Exam Updated Vital Signs BP 115/72   Pulse 70   Temp 98.1 F (36.7 C) (Oral)   Resp 16   Ht 5\' 6"  (1.676 m)   Wt 90.7 kg   SpO2 99%   BMI 32.28 kg/m   Physical Exam Vitals signs and nursing note reviewed.  Constitutional:      General: She is not in acute distress.    Appearance: She is well-developed.     Comments: Resting comfortably in bed  HENT:     Head: Normocephalic and atraumatic.  Eyes:     General:        Right eye: No discharge.        Left eye: No discharge.     Conjunctiva/sclera: Conjunctivae normal.  Neck:     Vascular: No JVD.     Trachea: No tracheal deviation.  Cardiovascular:     Rate and Rhythm: Normal rate and regular rhythm.  Pulmonary:     Effort: Pulmonary effort is normal.     Breath sounds: Normal breath sounds.  Abdominal:     General: Abdomen is flat. Bowel sounds are normal. There is no distension.     Palpations: Abdomen is  soft.     Tenderness: There is abdominal tenderness in the epigastric area. There is no right CVA tenderness, left CVA tenderness, guarding or rebound. Negative signs include Murphy's sign and Rovsing's sign.  Skin:    General: Skin is warm and dry.     Findings: No erythema.  Neurological:     General: No focal deficit present.     Mental Status: She is alert and oriented to person, place, and time.  Psychiatric:        Attention and Perception: Attention normal.        Mood and Affect: Affect is labile.        Speech: Speech normal.        Behavior: Behavior is cooperative.        Thought Content: Thought content is delusional. Thought content does not include homicidal or suicidal ideation. Thought content does not include homicidal or suicidal plan.      ED Treatments / Results  Labs (all labs ordered are listed, but only abnormal results are displayed) Labs Reviewed  URINALYSIS, ROUTINE W REFLEX MICROSCOPIC - Abnormal; Notable for the following components:      Result Value   APPearance HAZY (*)    All other components within normal limits  AMMONIA  RAPID URINE DRUG SCREEN, HOSP PERFORMED    EKG None  Radiology Ct Abdomen Pelvis W Contrast  Result Date: 05/07/2019 CLINICAL DATA:  Worsening abdominal pain, nausea, vomiting and diarrhea for couple days, patient states recently diagnosed with "stomach" cancer but has not begun treatment EXAM: CT ABDOMEN AND PELVIS WITH CONTRAST TECHNIQUE: Multidetector CT imaging of the abdomen and pelvis was performed using the standard protocol following bolus administration of intravenous  contrast. Sagittal and coronal MPR images reconstructed from axial data set. Respiratory motion artifacts degrade exam. CONTRAST:  161mL OMNIPAQUE IOHEXOL 300 MG/ML SOLN IV. No oral contrast. COMPARISON:  Coronal images from a prior abdominal CT of 01/18/2013 FINDINGS: Lower chest: Subsegmental atelectasis at base of RIGHT middle lobe. Hepatobiliary: 3  poorly defined low-attenuation lesions are seen within the liver, nonspecific, metastatic disease not excluded. These include a 16 x 11 mm diameter nodule at the medial segment LEFT lobe image 22, 9 mm nodule lateral segment LEFT lobe image 27, and 13 x 10 mm nodule at lateral segment LEFT lobe image 36. Gallbladder surgically absent. No biliary dilatation. Pancreas: Normal appearance Spleen: Normal appearance Adrenals/Urinary Tract: Adrenal glands normal appearance. Question tiny nonobstructing BILATERAL renal calculi. Kidneys, ureters and bladder otherwise normal appearance. Stomach/Bowel: Prior appendectomy. Few scattered diverticula of descending and sigmoid colon without evidence of diverticulitis. This bowel loops otherwise unremarkable. Stomach contains food debris but is otherwise normal appearance. No gastric mass identified. Vascular/Lymphatic: Aorta normal caliber. Vascular structures grossly patent. No adenopathy. Scattered pelvic phleboliths. Reproductive: Uterus surgically absent with nonvisualization of ovaries Other: No free air or free fluid. No hernia or inflammatory process. Musculoskeletal: Bones demineralized. IMPRESSION: Three poorly defined low-attenuation hepatic lesions, nonspecific but concerning for metastatic disease. No primary tumor identified. Tiny BILATERAL nonobstructing renal calculi. Minimal sigmoid diverticulosis. Electronically Signed   By: Lavonia Dana M.D.   On: 05/07/2019 21:13    Procedures Procedures (including critical care time)  Medications Ordered in ED Medications  alum & mag hydroxide-simeth (MAALOX/MYLANTA) 200-200-20 MG/5ML suspension 30 mL (30 mLs Oral Given 05/08/19 1906)  ondansetron (ZOFRAN) tablet 4 mg (has no administration in time range)  zolpidem (AMBIEN) tablet 5 mg (5 mg Oral Given 05/08/19 2038)  acetaminophen (TYLENOL) tablet 650 mg (has no administration in time range)  nicotine (NICODERM CQ - dosed in mg/24 hours) patch 21 mg (21 mg Transdermal  Patch Applied 05/08/19 1906)  ondansetron (ZOFRAN) injection 4 mg (4 mg Intravenous Given 05/08/19 1427)  famotidine (PEPCID) IVPB 20 mg premix (0 mg Intravenous Stopped 05/08/19 1459)     Initial Impression / Assessment and Plan / ED Course  I have reviewed the triage vital signs and the nursing notes.  Pertinent labs & imaging results that were available during my care of the patient were reviewed by me and considered in my medical decision making (see chart for details).        Patient presenting for evaluation of upper abdominal pain.  She is afebrile, vital signs stable.  Overall well-appearing, nontoxic.  No peritoneal signs on examination of the abdomen.  She was seen for the same complaint yesterday but left prior to receiving her results.  She also exhibits some bizarre affect, appears to confabulate truth from fiction, mostly drawing upon family history it seems.  She has had psychiatric admissions for manic behavior previously.  Lab work obtained yesterday was reassuring.  Given her imaging yesterday showed possible liver metastases and ammonia was obtained today which was normal.  UDS is negative and UA does not suggest UTI or nephrolithiasis.  She was given Zofran and Pepcid in the ED with complete resolution of her upper abdominal pain which she tells me is consistent with her usual GERD.  She is medically cleared from my standpoint but I would like for our psych team to evaluate her given her bizarre behavior and clear delusions and impaired judgment.  4:00PM TTS recommends overnight observation with a.m. psychiatric evaluation.  Of note  the patient is here voluntarily currently but if she attempts to leave prior to psychiatric evaluation in the morning she will require IVC.  Final Clinical Impressions(s) / ED Diagnoses   Final diagnoses:  Upper abdominal pain  Gastroesophageal reflux disease, unspecified whether esophagitis present  Bipolar 1 disorder [facility])  Bizarre behavior     ED Discharge Orders    None       Debroah Baller 05/08/19 2223    Nat Christen, MD 05/09/19 [phone]

## 2019-05-08 NOTE — ED Notes (Signed)
TTS being performed.  

## 2019-05-09 MED ORDER — TRAZODONE HCL 50 MG PO TABS
100.0000 mg | ORAL_TABLET | Freq: Every day | ORAL | Status: DC
  Administered 2019-05-09 (×2): 100 mg via ORAL
  Filled 2019-05-09 (×2): qty 2

## 2019-05-09 MED ORDER — PANTOPRAZOLE SODIUM 40 MG PO TBEC
40.0000 mg | DELAYED_RELEASE_TABLET | Freq: Every day | ORAL | Status: DC
  Administered 2019-05-09 – 2019-05-10 (×2): 40 mg via ORAL
  Filled 2019-05-09 (×2): qty 1

## 2019-05-09 MED ORDER — GABAPENTIN 300 MG PO CAPS
300.0000 mg | ORAL_CAPSULE | Freq: Three times a day (TID) | ORAL | Status: DC
  Administered 2019-05-09 – 2019-05-10 (×4): 300 mg via ORAL
  Filled 2019-05-09 (×5): qty 1

## 2019-05-09 MED ORDER — LURASIDONE HCL 40 MG PO TABS
40.0000 mg | ORAL_TABLET | Freq: Two times a day (BID) | ORAL | Status: DC
  Administered 2019-05-09 – 2019-05-10 (×3): 40 mg via ORAL
  Filled 2019-05-09 (×6): qty 1

## 2019-05-09 MED ORDER — LAMOTRIGINE 100 MG PO TABS
200.0000 mg | ORAL_TABLET | Freq: Every day | ORAL | Status: DC
  Administered 2019-05-09 – 2019-05-10 (×2): 200 mg via ORAL
  Filled 2019-05-09: qty 2
  Filled 2019-05-09: qty 8

## 2019-05-09 MED ORDER — NAPROXEN 250 MG PO TABS
500.0000 mg | ORAL_TABLET | Freq: Two times a day (BID) | ORAL | Status: DC
  Administered 2019-05-09 – 2019-05-10 (×3): 500 mg via ORAL
  Filled 2019-05-09 (×3): qty 2

## 2019-05-09 MED ORDER — QUETIAPINE FUMARATE 25 MG PO TABS
350.0000 mg | ORAL_TABLET | Freq: Every day | ORAL | Status: DC
  Administered 2019-05-09 (×2): 350 mg via ORAL
  Filled 2019-05-09: qty 2
  Filled 2019-05-09: qty 3

## 2019-05-09 MED ORDER — ACETAMINOPHEN 325 MG PO TABS: 650.0000 mg | ORAL_TABLET | Freq: Four times a day (QID) | ORAL | Status: DC | PRN

## 2019-05-09 MED ORDER — TOPIRAMATE 25 MG PO TABS
25.0000 mg | ORAL_TABLET | Freq: Every day | ORAL | Status: DC
  Administered 2019-05-09 – 2019-05-10 (×2): 25 mg via ORAL
  Filled 2019-05-09 (×2): qty 1

## 2019-05-09 NOTE — ED Notes (Signed)
Noticed discarded nicotine patch on patient's bed when giving medication. Patient states "I don't need it. I already quit." Discarded nicotine patch in black bin as directed.

## 2019-05-09 NOTE — ED Notes (Signed)
TTS at bedside. 

## 2019-05-09 NOTE — ED Notes (Signed)
Patient is resting comfortably. 

## 2019-05-09 NOTE — Progress Notes (Signed)
CSW attempted to gain collateral information from patient's husband [phone], her daughter Greggory Brandy [phone], and uncle [phone] although attempts were unsuccessful. CSW left HIPAA compliant voice message with Ms Radene Knee requesting a return phone call.   Audree Camel, LCSW, [facility] Dis[facility]/TTS [phone]

## 2019-05-09 NOTE — ED Notes (Signed)
Allison Bolton - family friend  [phone]

## 2019-05-09 NOTE — ED Notes (Signed)
Patient ate entire meal tray for lunch.
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
## 2019-05-09 NOTE — ED Notes (Signed)
Called [facility] and spoke with Waunita Schooner and gave information from patient's friend.

## 2019-05-09 NOTE — ED Notes (Signed)
Pt given meal tray.
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
## 2019-05-09 NOTE — ED Notes (Signed)
Pt asking to use the phone; pt informed she has had her 3 phone calls for the day

## 2019-05-10 ENCOUNTER — Other Ambulatory Visit: Payer: Self-pay

## 2019-05-10 ENCOUNTER — Inpatient Hospital Stay (HOSPITAL_COMMUNITY)
Admission: AD | Admit: 2019-05-10 | Discharge: 2019-05-26 | DRG: 885 | Disposition: A | Discharge status: Home / Self Care | Payer: Medicare Other | Source: Intra-hospital | Attending: Psychiatry | Admitting: Psychiatry

## 2019-05-10 ENCOUNTER — Encounter (HOSPITAL_COMMUNITY): Payer: Self-pay

## 2019-05-10 DIAGNOSIS — Z79899 Other long term (current) drug therapy: Secondary | ICD-10-CM | POA: Diagnosis not present

## 2019-05-10 DIAGNOSIS — D1800 Hemangioma unspecified site: Secondary | ICD-10-CM | POA: Diagnosis present

## 2019-05-10 DIAGNOSIS — Z20828 Contact with and (suspected) exposure to other viral communicable diseases: Secondary | ICD-10-CM | POA: Diagnosis present

## 2019-05-10 DIAGNOSIS — M797 Fibromyalgia: Secondary | ICD-10-CM | POA: Diagnosis present

## 2019-05-10 DIAGNOSIS — Z9119 Patient's noncompliance with other medical treatment and regimen: Secondary | ICD-10-CM

## 2019-05-10 DIAGNOSIS — Z8349 Family history of other endocrine, nutritional and metabolic diseases: Secondary | ICD-10-CM | POA: Diagnosis not present

## 2019-05-10 DIAGNOSIS — I10 Essential (primary) hypertension: Secondary | ICD-10-CM | POA: Diagnosis present

## 2019-05-10 DIAGNOSIS — G4700 Insomnia, unspecified: Secondary | ICD-10-CM | POA: Diagnosis present

## 2019-05-10 DIAGNOSIS — R7989 Other specified abnormal findings of blood chemistry: Secondary | ICD-10-CM | POA: Diagnosis not present

## 2019-05-10 DIAGNOSIS — Z800 Family history of malignant neoplasm of digestive organs: Secondary | ICD-10-CM

## 2019-05-10 DIAGNOSIS — E039 Hypothyroidism, unspecified: Secondary | ICD-10-CM | POA: Diagnosis present

## 2019-05-10 DIAGNOSIS — F312 Bipolar disorder, current episode manic severe with psychotic features: Secondary | ICD-10-CM | POA: Diagnosis present

## 2019-05-10 DIAGNOSIS — Z90710 Acquired absence of both cervix and uterus: Secondary | ICD-10-CM

## 2019-05-10 DIAGNOSIS — K269 Duodenal ulcer, unspecified as acute or chronic, without hemorrhage or perforation: Secondary | ICD-10-CM | POA: Diagnosis present

## 2019-05-10 DIAGNOSIS — Z9049 Acquired absence of other specified parts of digestive tract: Secondary | ICD-10-CM

## 2019-05-10 DIAGNOSIS — F22 Delusional disorders: Secondary | ICD-10-CM | POA: Diagnosis present

## 2019-05-10 DIAGNOSIS — R946 Abnormal results of thyroid function studies: Secondary | ICD-10-CM | POA: Diagnosis present

## 2019-05-10 DIAGNOSIS — J45909 Unspecified asthma, uncomplicated: Secondary | ICD-10-CM | POA: Diagnosis present

## 2019-05-10 LAB — SARS CORONAVIRUS 2 BY RT PCR (HOSPITAL ORDER, PERFORMED IN ~~LOC~~ HOSPITAL LAB): SARS Coronavirus 2: NEGATIVE

## 2019-05-10 MED ORDER — TOPIRAMATE 25 MG PO TABS
25.0000 mg | ORAL_TABLET | Freq: Every day | ORAL | Status: DC
  Filled 2019-05-10: qty 1

## 2019-05-10 MED ORDER — ACETAMINOPHEN 325 MG PO TABS
650.0000 mg | ORAL_TABLET | Freq: Four times a day (QID) | ORAL | Status: DC | PRN
  Administered 2019-05-11 – 2019-05-25 (×10): 650 mg via ORAL
  Filled 2019-05-10 (×10): qty 2

## 2019-05-10 MED ORDER — PANTOPRAZOLE SODIUM 40 MG PO TBEC
40.0000 mg | DELAYED_RELEASE_TABLET | Freq: Every day | ORAL | Status: DC
  Administered 2019-05-11 – 2019-05-26 (×16): 40 mg via ORAL
  Filled 2019-05-10 (×19): qty 1

## 2019-05-10 MED ORDER — GABAPENTIN 300 MG PO CAPS
300.0000 mg | ORAL_CAPSULE | Freq: Three times a day (TID) | ORAL | Status: DC
  Administered 2019-05-11 – 2019-05-13 (×7): 300 mg via ORAL
  Filled 2019-05-10 (×12): qty 1

## 2019-05-10 MED ORDER — QUETIAPINE FUMARATE 50 MG PO TABS
350.0000 mg | ORAL_TABLET | Freq: Every day | ORAL | Status: DC
  Administered 2019-05-10: 350 mg via ORAL
  Filled 2019-05-10 (×2): qty 1

## 2019-05-10 MED ORDER — LURASIDONE HCL 40 MG PO TABS
40.0000 mg | ORAL_TABLET | Freq: Two times a day (BID) | ORAL | Status: DC
  Administered 2019-05-10: 40 mg via ORAL
  Filled 2019-05-10 (×3): qty 1

## 2019-05-10 MED ORDER — TRAZODONE HCL 100 MG PO TABS
100.0000 mg | ORAL_TABLET | Freq: Every day | ORAL | Status: DC
  Administered 2019-05-10 – 2019-05-11 (×2): 100 mg via ORAL
  Filled 2019-05-10 (×6): qty 1

## 2019-05-10 MED ORDER — NAPROXEN 500 MG PO TABS
500.0000 mg | ORAL_TABLET | Freq: Two times a day (BID) | ORAL | Status: DC
  Administered 2019-05-10 – 2019-05-26 (×32): 500 mg via ORAL
  Filled 2019-05-10 (×39): qty 1

## 2019-05-10 MED ORDER — LAMOTRIGINE 200 MG PO TABS
200.0000 mg | ORAL_TABLET | Freq: Every day | ORAL | Status: DC
  Filled 2019-05-10: qty 1

## 2019-05-10 NOTE — Progress Notes (Signed)
Pt accepted to [facility]; room 507-2  Dr. Parke Poisson is the accepting provider.    Dr. Jake Samples is the attending provider.    Call report to [phone]     [facility] notified.     Pt is voluntary and will be transported by [facility]  Pt is scheduled to arrive at [facility] at [place], [place], [place] Disposition [facility]/TTS [phone]
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
## 2019-05-10 NOTE — Progress Notes (Signed)
Psychoeducational Group Note  Date:  05/10/2019 Time:  2038  Group Topic/Focus:  Wrap-Up Group:   The focus of this group is to help patients review their daily goal of treatment and discuss progress on daily workbooks.  Participation Level: Did Not Attend  Participation Quality:  Not Applicable  Affect:  Not Applicable  Cognitive:  Not Applicable  Insight:  Not Applicable  Engagement in Group: Not Applicable  Additional Comments:  The patient did not attend group since she had yet to be admitted.   Archie Balboa S 05/10/2019, 8:38 PM

## 2019-05-10 NOTE — ED Notes (Signed)
Pt talking with pt in room 15, informed pt she needed to go back to sleep; pt transferred to another room

## 2019-05-11 DIAGNOSIS — F312 Bipolar disorder, current episode manic severe with psychotic features: Principal | ICD-10-CM

## 2019-05-11 LAB — LIPID PANEL
Cholesterol: 139 mg/dL (ref 0–200)
HDL: 44 mg/dL (ref >40)
LDL Cholesterol: 82 mg/dL (ref 0–99)
Total CHOL/HDL Ratio: 3.2 RATIO
Triglycerides: 66 mg/dL (ref <150)
VLDL: 13 mg/dL (ref 0–40)

## 2019-05-11 LAB — HEMOGLOBIN A1C
Hgb A1c MFr Bld: 5.3 % (ref 4.8–5.6)
Mean Plasma Glucose: 105.41 mg/dL

## 2019-05-11 LAB — TSH: TSH: 5.215 u[IU]/mL — ABNORMAL HIGH (ref 0.350–4.500)

## 2019-05-11 MED ORDER — ZOLPIDEM TARTRATE 5 MG PO TABS
15.0000 mg | ORAL_TABLET | Freq: Every day | ORAL | Status: DC
  Administered 2019-05-11 – 2019-05-13 (×3): 15 mg via ORAL
  Filled 2019-05-11 (×3): qty 3

## 2019-05-11 MED ORDER — LORAZEPAM 1 MG PO TABS
1.0000 mg | ORAL_TABLET | Freq: Three times a day (TID) | ORAL | Status: AC
  Administered 2019-05-11 – 2019-05-12 (×5): 1 mg via ORAL
  Filled 2019-05-11 (×5): qty 1

## 2019-05-11 MED ORDER — ZOLPIDEM TARTRATE 5 MG PO TABS: 10.0000 mg | ORAL_TABLET | Freq: Every day | ORAL | Status: DC

## 2019-05-11 MED ORDER — RISPERIDONE 3 MG PO TABS
3.0000 mg | ORAL_TABLET | Freq: Three times a day (TID) | ORAL | Status: DC
  Administered 2019-05-11 – 2019-05-14 (×9): 3 mg via ORAL
  Filled 2019-05-11 (×15): qty 1

## 2019-05-11 MED ORDER — CARBAMAZEPINE 200 MG PO TABS
200.0000 mg | ORAL_TABLET | Freq: Three times a day (TID) | ORAL | Status: DC
  Administered 2019-05-11 – 2019-05-14 (×9): 200 mg via ORAL
  Filled 2019-05-11 (×15): qty 1

## 2019-05-11 MED ORDER — MAGNESIUM HYDROXIDE 400 MG/5ML PO SUSP
30.0000 mL | Freq: Every day | ORAL | Status: DC | PRN
  Administered 2019-05-12: 30 mL via ORAL
  Filled 2019-05-11: qty 30

## 2019-05-11 MED ORDER — ONDANSETRON HCL 4 MG PO TABS
4.0000 mg | ORAL_TABLET | Freq: Once | ORAL | Status: AC
  Administered 2019-05-11: 4 mg via ORAL
  Filled 2019-05-11 (×2): qty 1

## 2019-05-11 MED ORDER — BENZTROPINE MESYLATE 1 MG PO TABS
1.0000 mg | ORAL_TABLET | Freq: Two times a day (BID) | ORAL | Status: DC
  Administered 2019-05-11 – 2019-05-26 (×31): 1 mg via ORAL
  Filled 2019-05-11 (×37): qty 1

## 2019-05-11 MED ORDER — RISPERIDONE 3 MG PO TABS: 3.0000 mg | ORAL_TABLET | Freq: Two times a day (BID) | ORAL | Status: DC

## 2019-05-11 NOTE — BHH Group Notes (Signed)
[facility] LCSW Group Therapy Note  Date/Time: 1:30pm 05/11/2019  Type of Therapy/Topic:  Group Therapy:  Feelings about Diagnosis  Participation Level:  Minimal   Mood: Manic    Description of Group:    This group will allow patients to explore their thoughts and feelings about diagnoses they have received. Patients will be guided to explore their level of understanding and acceptance of these diagnoses. Facilitator will encourage patients to process their thoughts and feelings about the reactions of others to their diagnosis, and will guide patients in identifying ways to discuss their diagnosis with significant others in their lives. This group will be process-oriented, with patients participating in exploration of their own experiences as well as giving and receiving support and challenge from other group members.   Therapeutic Goals: 1. Patient will demonstrate understanding of diagnosis as evidence by identifying two or more symptoms of the disorder:  2. Patient will be able to express two feelings regarding the diagnosis 3. Patient will demonstrate ability to communicate their needs through discussion and/or role plays  Summary of Patient Progress:  Pt was present, but manic. Pt interrupted other member when talking and was organizing magazines.       Therapeutic Modalities:   Cognitive Behavioral Therapy Brief Therapy Feelings Identification   Ovidio Kin, MSW intern
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
## 2019-05-11 NOTE — BHH Group Notes (Signed)
Adult Psychoeducational Group Note  Date:  05/11/2019 Time:  10:43 PM  Group Topic/Focus:  Wrap-Up Group:   The focus of this group is to help patients review their daily goal of treatment and discuss progress on daily workbooks.  Participation Level:  Active  Participation Quality:  Appropriate and Attentive  Affect:  Appropriate  Cognitive:  Alert and Appropriate  Insight: Appropriate and Good  Engagement in Group:  Engaged  Modes of Intervention:  Discussion and Education  Additional Comments:  Pt attended and participated in wrap up group this evening and rated their day a 10/10. Pt stated that their goal was to "meet their dad" Biochemist, clinical at [employer]). Pt stated that they have not visited them as of now.    Cristi Loron 05/11/2019, 10:43 PM

## 2019-05-11 NOTE — Progress Notes (Signed)
Pt has been up throughout the night, hypomanic making various request not relevant for the middle of the night not trying to sleep. Pt asked for envelopes to mail a letter.

## 2019-05-11 NOTE — Tx Team (Signed)
Interdisciplinary Treatment and Diagnostic Plan Update  05/11/2019 Time of Session: 10:25am Allison Bolton MRN: [id number]  Principal Diagnosis: <principal problem not specified>  Secondary Diagnoses: Active Problems:   Delusional disorder ([place])   Bipolar I disorder, current or most recent episode manic, with psychotic features ([place])   Current Medications:  Current Facility-Administered Medications  Medication Dose Route Frequency Provider Last Rate Last Dose  . acetaminophen (TYLENOL) tablet 650 mg  650 mg Oral Q6H PRN Deloria Lair, NP   650 mg at 05/11/19 0005  . benztropine (COGENTIN) tablet 1 mg  1 mg Oral BID Johnn Hai, MD   1 mg at 05/11/19 0755  . carbamazepine (TEGRETOL) tablet 200 mg  200 mg Oral TID Johnn Hai, MD   200 mg at 05/11/19 0755  . gabapentin (NEURONTIN) capsule 300 mg  300 mg Oral TID Mordecai Maes, NP   300 mg at 05/11/19 0755  . LORazepam (ATIVAN) tablet 1 mg  1 mg Oral TID Johnn Hai, MD   1 mg at 05/11/19 0755  . magnesium hydroxide (MILK OF MAGNESIA) suspension 30 mL  30 mL Oral Daily PRN Anike, Adaku C, NP      . naproxen (NAPROSYN) tablet 500 mg  500 mg Oral BID Mordecai Maes, NP   500 mg at 05/11/19 0754  . pantoprazole (PROTONIX) EC tablet 40 mg  40 mg Oral Daily Mordecai Maes, NP   40 mg at 05/11/19 0755  . risperiDONE (RISPERDAL) tablet 3 mg  3 mg Oral TID Johnn Hai, MD   3 mg at 05/11/19 0754  . traZODone (DESYREL) tablet 100 mg  100 mg Oral QHS Mordecai Maes, NP   100 mg at 05/10/19 2107  . zolpidem (AMBIEN) tablet 15 mg  15 mg Oral QHS Johnn Hai, MD       PTA Medications: Medications Prior to Admission  Medication Sig Dispense Refill Last Dose  . acetaminophen (TYLENOL) 325 MG tablet Take 650 mg by mouth every 6 (six) hours as needed.     . gabapentin (NEURONTIN) 300 MG capsule TAKE 1 CAPSULE BY MOUTH THREE TIMES A DAY     . lamoTRIgine (LAMICTAL) 200 MG tablet Take 200 mg by mouth daily.     Marland Kitchen LATUDA 80 MG TABS  tablet Take 0.5 tablets by mouth 2 (two) times daily.     . naproxen (NAPROSYN) 500 MG tablet Take 1 tablet (500 mg total) by mouth 2 (two) times daily. 30 tablet 0   . omeprazole (PRILOSEC) 20 MG capsule Take 20 mg by mouth daily.     . QUEtiapine (SEROQUEL) 200 MG tablet Take 350 mg by mouth at bedtime.      . topiramate (TOPAMAX) 25 MG tablet Take 25 mg by mouth daily.     . traZODone (DESYREL) 100 MG tablet Take 100 mg by mouth at bedtime.       Patient Stressors: Marital or family conflict Medication change or noncompliance  Patient Strengths: Technical sales engineer for treatment/growth  Treatment Modalities: Medication Management, Group therapy, Case management,  1 to 1 session with clinician, Psychoeducation, Recreational therapy.   Physician Treatment Plan for Primary Diagnosis: <principal problem not specified> Long Term Goal(s): Improvement in symptoms so as ready for discharge Improvement in symptoms so as ready for discharge   Short Term Goals: Ability to disclose and discuss suicidal ideas Ability to demonstrate self-control will improve Ability to identify and develop effective coping behaviors will improve Ability to maintain clinical measurements within normal  limits will improve Compliance with prescribed medications will improve Ability to disclose and discuss suicidal ideas Ability to demonstrate self-control will improve Ability to identify and develop effective coping behaviors will improve Ability to maintain clinical measurements within normal limits will improve Compliance with prescribed medications will improve  Medication Management: Evaluate patient's response, side effects, and tolerance of medication regimen.  Therapeutic Interventions: 1 to 1 sessions, Unit Group sessions and Medication administration.  Evaluation of Outcomes: Progressing  Physician Treatment Plan for Secondary Diagnosis: Active Problems:   Delusional disorder  ([place])   Bipolar I disorder, current or most recent episode manic, with psychotic features ([place])  Long Term Goal(s): Improvement in symptoms so as ready for discharge Improvement in symptoms so as ready for discharge   Short Term Goals: Ability to disclose and discuss suicidal ideas Ability to demonstrate self-control will improve Ability to identify and develop effective coping behaviors will improve Ability to maintain clinical measurements within normal limits will improve Compliance with prescribed medications will improve Ability to disclose and discuss suicidal ideas Ability to demonstrate self-control will improve Ability to identify and develop effective coping behaviors will improve Ability to maintain clinical measurements within normal limits will improve Compliance with prescribed medications will improve     Medication Management: Evaluate patient's response, side effects, and tolerance of medication regimen.  Therapeutic Interventions: 1 to 1 sessions, Unit Group sessions and Medication administration.  Evaluation of Outcomes: Progressing   RN Treatment Plan for Primary Diagnosis: <principal problem not specified> Long Term Goal(s): Knowledge of disease and therapeutic regimen to maintain health will improve  Short Term Goals: Ability to participate in decision making will improve, Ability to verbalize feelings will improve, Ability to disclose and discuss suicidal ideas, Ability to identify and develop effective coping behaviors will improve and Compliance with prescribed medications will improve  Medication Management: RN will administer medications as ordered by provider, will assess and evaluate patient's response and provide education to patient for prescribed medication. RN will report any adverse and/or side effects to prescribing provider.  Therapeutic Interventions: 1 on 1 counseling sessions, Psychoeducation, Medication administration, Evaluate responses to  treatment, Monitor vital signs and CBGs as ordered, Perform/monitor CIWA, COWS, AIMS and Fall Risk screenings as ordered, Perform wound care treatments as ordered.  Evaluation of Outcomes: Progressing   LCSW Treatment Plan for Primary Diagnosis: <principal problem not specified> Long Term Goal(s): Safe transition to appropriate next level of care at discharge, Engage patient in therapeutic group addressing interpersonal concerns.  Short Term Goals: Engage patient in aftercare planning with referrals and resources and Increase skills for wellness and recovery  Therapeutic Interventions: Assess for all discharge needs, 1 to 1 time with Social worker, Explore available resources and support systems, Assess for adequacy in community support network, Educate family and significant other(s) on suicide prevention, Complete Psychosocial Assessment, Interpersonal group therapy.  Evaluation of Outcomes: Progressing   Progress in Treatment: Attending groups: No. Participating in groups: No. Taking medication as prescribed: Yes. Toleration medication: Yes. Family/Significant other contact made: No, will contact:  will contact if given consent to contact Patient understands diagnosis: No. Discussing patient identified problems/goals with staff: Yes. Medical problems stabilized or resolved: Yes. Denies suicidal/homicidal ideation: Yes. Issues/concerns per patient self-inventory: No. Other:   New problem(s) identified: No, Describe:  None  New Short Term/Long Term Goal(s): Medication stabilization, elimination of SI thoughts, and development of a comprehensive mental wellness plan.   Patient Goals:  Patient did not attend treatment team  Discharge Plan or  Barriers: CSW will continue to follow up for appropriate referrals and possible discharge planning  Reason for Continuation of Hospitalization: Delusions  Depression Mania Medication stabilization  Estimated Length of Stay: 2-3 days    Attendees: Patient: Allison Bolton 05/11/2019   Physician: Dr. Johnn Hai, MD 05/11/2019   Nursing: Vladimir Faster, RN 05/11/2019   RN Care Manager: 05/11/2019   Social Worker: Ardelle Anton, LCSW  05/11/2019   Recreational Therapist:  05/11/2019  Other: Ovidio Kin, MSW Intern 05/11/2019   Other:  05/11/2019  Other: 05/11/2019     Scribe for Treatment Team: Trecia Rogers, LCSW 05/11/2019 11:28 AM

## 2019-05-11 NOTE — Tx Team (Signed)
Initial Treatment Plan 05/11/2019 12:54 AM Allison Bolton [id number]    PATIENT STRESSORS: Marital or family conflict Medication change or noncompliance   PATIENT STRENGTHS: General fund of knowledge Motivation for treatment/growth   PATIENT IDENTIFIED PROBLEMS:  psychosis   " no, Just my heart, just want to talk to my children"                   DISCHARGE CRITERIA:  Improved stabilization in mood, thinking, and/or behavior Verbal commitment to aftercare and medication compliance  PRELIMINARY DISCHARGE PLAN: Attend PHP/IOP Outpatient therapy  PATIENT/FAMILY INVOLVEMENT: This treatment plan has been presented to and reviewed with the patient, Allison Bolton.  The patient and family have been given the opportunity to ask questions and make suggestions.  Providence Crosby, RN 05/11/2019, 12:54 AM

## 2019-05-11 NOTE — Progress Notes (Signed)
Recreation Therapy Notes  Date: 11.4.20 Time: 1000 Location: 500 Hall Dayroom   Group Topic: Communication, Team Building, Problem Solving  Goal Area(s) Addresses:  Patient will effectively work with peer towards shared goal.  Patient will identify skill used to make activity successful.  Patient will identify how skills used during activity can be used to reach post d/c goals.   Behavioral Response: Minimal  Intervention: STEM Activity   Activity: [employer]. Patients were provided the following materials: 5 drinking straws, 5 rubber bands, 5 paper clips, 2 index cards and 2 drinking cups. Using the provided materials patients were asked to build a launching mechanisms to launch a ping pong ball approximately 12 feet. Patients were divided into teams of 3-5.   Education: Education officer, community, Dentist.   Education Outcome: Acknowledges education/In group clarification offered/Needs additional education.   Clinical Observations/Feedback:  Pt had a hard time focusing on the activity.  Pt also had a hard time staying seated to complete activity.  Pt seemed anxious throughout activity.    Victorino Sparrow, LRT/CTRS    Victorino Sparrow A 05/11/2019 12:45 PM
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
## 2019-05-11 NOTE — Progress Notes (Signed)
Pt up to the nursing station with various somatic complaints, HA, "stomach ache". Pt came to the nursing station requesting to make various calls to people this evening. Pt needed frequent redirection.

## 2019-05-12 LAB — PROLACTIN: Prolactin: 24.1 ng/mL — ABNORMAL HIGH (ref 4.8–23.3)

## 2019-05-12 MED ORDER — TRAZODONE HCL 100 MG PO TABS
200.0000 mg | ORAL_TABLET | Freq: Every day | ORAL | Status: DC
  Administered 2019-05-12 – 2019-05-25 (×14): 200 mg via ORAL
  Filled 2019-05-12 (×17): qty 2

## 2019-05-12 NOTE — Progress Notes (Signed)
Pt has been up most of the night appeared confused and kept ringing the bell. Pt took Trazodone and Ambien before bed but still was not able to sleep. Pt complained of constipation this morning stating she has not used the bathroom for the past 3 days, milk of mag offered, will continue to monitor.

## 2019-05-12 NOTE — Progress Notes (Signed)
[facility] MD Progress Note  05/12/2019 11:34 AM Allison Bolton  MRN:  [id number] Subjective:    Patient is alert oriented and generally cooperative not oriented to exact date she does however express delusional believes that one of the other patients, Allison Bolton "is her son" and she must leave to open a bank account for him.  Overall more compliant though and no thoughts of harming self but again quite delusional.  Somewhat hypomanic at intervals.  No EPS or TD Principal Problem: Exacerbation of underlying psychotic disorder Diagnosis: Active Problems:   Delusional disorder (HCC)   Bipolar I disorder, current or most recent episode manic, with psychotic features ([place])  Total Time spent with patient: 20 minutes  Past Psychiatric History: see eval  Past Medical History:  Past Medical History:  Diagnosis Date  . Arthritis   . Asthma   . Bipolar 1 disorder ([place])   . Diverticulosis   . Dysrhythmia    sts "I have heart palpitations"  . Fibromyalgia   . H/O hiatal hernia    4  . Headache(784.0)   . Meniere's disease   . S/P colonoscopy    Dr. Laural Golden 2010: few small diverticula at sigmoid. otherwise normal.   . Shortness of breath     Past Surgical History:  Procedure Laterality Date  . ABDOMINAL HYSTERECTOMY    . APPENDECTOMY    . CESAREAN SECTION    . CHOLECYSTECTOMY    . exploratory laparoscopy    . FOOT SURGERY    . HEMORRHOID SURGERY    . LAPAROSCOPIC APPENDECTOMY  02/19/2011   Procedure: APPENDECTOMY LAPAROSCOPIC;  Surgeon: Jamesetta So;  Location: [facility];  Service: General;  Laterality: N/A;  . LAPAROSCOPY  02/19/2011   Procedure: LAPAROSCOPY DIAGNOSTIC;  Surgeon: Jamesetta So;  Location: [facility];  Service: General;  Laterality: N/A;  . left ovarian removal    . multiple hernia repairs    . Right ovarian removal    . TONSILLECTOMY    . TONSILLECTOMY AND ADENOIDECTOMY    . tubes in ears    . UMBILICAL HERNIA REPAIR  Dec 2011   Dr. Arnoldo Morale  . wisdom teeth removal      Family History:  Family History  Problem Relation Age of Onset  . Thyroid disease Mother   . Colon cancer Other        paternal and maternal grandfather  . Meniere's disease Other   . Colon cancer Father   . Colon cancer Brother   . Anesthesia problems Neg Hx   . Hypotension Neg Hx   . Malignant hyperthermia Neg Hx   . Pseudochol deficiency Neg Hx    Family Psychiatric  History: see eval Social History:  Social History   Substance and Sexual Activity  Alcohol Use No   Comment: not since June 2012     Social History   Substance and Sexual Activity  Drug Use No   Comment: patient denies any-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    Social History   Socioeconomic History  . Marital status: Legally Separated    Spouse name: Not on file  . Number of children: Not on file  . Years of education: Not on file  . Highest education level: Not on file  Occupational History  . Not on file  Social Needs  . Financial resource strain: Not on file  . Food insecurity    Worry: Not on file    Inability: Not on file  .  Transportation needs    Medical: Not on file    Non-medical: Not on file  Tobacco Use  . Smoking status: Former Smoker    Packs/day: 0.50    Years: 20.00    Pack years: 10.00    Types: Cigarettes    Start date: 07/07/1981    Quit date: 11/05/2018    Years since quitting: 0.5  . Smokeless tobacco: Never Used  Substance and Sexual Activity  . Alcohol use: No    Comment: not since June 2012  . 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 Sexual activity: Never    Birth control/protection: Surgical  Lifestyle  . Physical activity    Days per week: Not on file    Minutes per session: Not on file  . Stress: Not on file  Relationships  . Social Herbalist on phone: Not on file    Gets together: Not on file    Attends religious service: Not on file    Active member of club or organization: Not on file     Attends meetings of clubs or organizations: Not on file    Relationship status: Not on file  Other Topics Concern  . Not on file  Social History Narrative  . Not on file   Additional Social History:                         Sleep: Good  Appetite:  Good  Current Medications: Current Facility-Administered Medications  Medication Dose Route Frequency Provider Last Rate Last Dose  . acetaminophen (TYLENOL) tablet 650 mg  650 mg Oral Q6H PRN Deloria Lair, NP   650 mg at 05/12/19 0527  . benztropine (COGENTIN) tablet 1 mg  1 mg Oral BID Johnn Hai, MD   1 mg at 05/12/19 0816  . carbamazepine (TEGRETOL) tablet 200 mg  200 mg Oral TID Johnn Hai, MD   200 mg at 05/12/19 1123  . gabapentin (NEURONTIN) capsule 300 mg  300 mg Oral TID Mordecai Maes, NP   300 mg at 05/12/19 1122  . LORazepam (ATIVAN) tablet 1 mg  1 mg Oral TID Johnn Hai, MD   1 mg at 05/12/19 1124  . magnesium hydroxide (MILK OF MAGNESIA) suspension 30 mL  30 mL Oral Daily PRN Anike, Adaku C, NP   30 mL at 05/12/19 0526  . naproxen (NAPROSYN) tablet 500 mg  500 mg Oral BID Mordecai Maes, NP   500 mg at 05/12/19 0815  . pantoprazole (PROTONIX) EC tablet 40 mg  40 mg Oral Daily Mordecai Maes, NP   40 mg at 05/12/19 0815  . risperiDONE (RISPERDAL) tablet 3 mg  3 mg Oral TID Johnn Hai, MD   3 mg at 05/12/19 1125  . traZODone (DESYREL) tablet 100 mg  100 mg Oral QHS Mordecai Maes, NP   100 mg at 05/11/19 2030  . zolpidem (AMBIEN) tablet 15 mg  15 mg Oral QHS Johnn Hai, MD   15 mg at 05/11/19 2030    Lab Results:  Results for orders placed or performed during the hospital encounter of 05/10/19 (from the past 48 hour(s))  Hemoglobin A1c     Status: None   Collection Time: 05/11/19  6:20 AM  Result Value Ref Range   Hgb A1c MFr Bld 5.3 4.8 - 5.6 %    Comment: (NOTE) Pre diabetes:  5.7%-6.4% Diabetes:              >6.4% Glycemic control for   <7.0% adults with diabetes    Mean  Plasma Glucose 105.41 mg/dL    Comment: Performed at [facility] [address] [place], [place] [address]  Lipid panel     Status: None   Collection Time: 05/11/19  6:20 AM  Result Value Ref Range   Cholesterol 139 0 - 200 mg/dL   Triglycerides 66 <150 mg/dL   HDL 44 >40 mg/dL   Total CHOL/HDL Ratio 3.2 RATIO   VLDL 13 0 - 40 mg/dL   LDL Cholesterol 82 0 - 99 mg/dL    Comment:        Total Cholesterol/HDL:CHD Risk Coronary Heart Disease Risk Table                     Men   Women  1/2 Average Risk   3.4   3.3  Average Risk       5.0   4.4  2 X Average Risk   9.6   7.1  3 X Average Risk  23.4   11.0        Use the calculated Patient Ratio above and the CHD Risk Table to determine the patient's CHD Risk.        ATP III CLASSIFICATION (LDL):  <100     mg/dL   Optimal  100-129  mg/dL   Near or Above                    Optimal  130-159  mg/dL   Borderline  160-189  mg/dL   High  >190     mg/dL   Very High Performed at [facility] [address], [place], [place] [address]   Prolactin     Status: Abnormal   Collection Time: 05/11/19  6:20 AM  Result Value Ref Range   Prolactin 24.1 (H) 4.8 - 23.3 ng/mL    Comment: (NOTE) Performed At: [facility] [place], [place] [id number] Rush Farmer MD [id number]   TSH     Status: Abnormal   Collection Time: 05/11/19  6:20 AM  Result Value Ref Range   TSH 5.215 (H) 0.350 - 4.500 uIU/mL    Comment: Performed by a 3rd Generation assay with a functional sensitivity of <=0.01 uIU/mL. Performed at [facility], [place] [address], [place], [place] [address]     Blood Alcohol level:  Lab Results  Component Value Date   ETH <10 05/07/2019   ETH <10 [id number]    Metabolic Disorder Labs: Lab Results  Component Value Date   HGBA1C 5.3 05/11/2019   MPG 105.41 05/11/2019   MPG 114 05/13/2013   Lab Results  Component Value Date   PROLACTIN 24.1 (H)  05/11/2019   Lab Results  Component Value Date   CHOL 139 05/11/2019   TRIG 66 05/11/2019   HDL 44 05/11/2019   CHOLHDL 3.2 05/11/2019   VLDL 13 05/11/2019   LDLCALC 82 05/11/2019   LDLCALC 88 05/13/2013    Physical Findings: AIMS:  , ,  ,  ,    CIWA:    COWS:     Musculoskeletal: Strength & Muscle Tone: within normal limits Gait & Station: normal Patient leans: N/A  Psychiatric Specialty Exam: Physical Exam  ROS  Blood pressure (!) 134/91, pulse 90, temperature 98 F (36.7 C), temperature source Oral,  resp. rate 20, height 5\' 6"  (1.676 m), weight 86.6 kg, SpO2 100 %.Body mass index is 30.83 kg/m.  General Appearance: Casual  Eye Contact:  Fair  Speech:  Clear and Coherent  Volume:  Increased  Mood:  Hypomanic  Affect:  Congruent  Thought Process:  Irrelevant and Descriptions of Associations: Loose  Orientation:  Full (Time, Place, and Person)  Thought Content:  Illogical and Delusions  Suicidal Thoughts:  No  Homicidal Thoughts:  No  Memory:  Immediate;   Fair Recent;   Fair Remote;   Fair  Judgement:  Fair  Insight:  Fair  Psychomotor Activity:  Normal  Concentration:  Concentration: Good and Attention Span: Fair  Recall:  [employer] of Knowledge:  Fair  Language:  Fair  Akathisia:  Negative  Handed:  Right  AIMS (if indicated):     Assets:  Leisure Time Physical Health  ADL's:  Intact  Cognition:  WNL  Sleep:  Number of Hours: 4.5     Treatment Plan Summary: Daily contact with patient to assess and evaluate symptoms and progress in treatment and Medication management to do antipsychotic and mood stabilizer therapy just Sleep Aid continue to monitor for safety continue reality based therapy  Vilma Will, MD 05/12/2019, 11:34 AM

## 2019-05-12 NOTE — Progress Notes (Signed)
Adult Psychoeducational Group Note  Date:  05/12/2019 Time:  8:15 PM  Group Topic/Focus:  Wrap-Up Group:   The focus of this group is to help patients review their daily goal of treatment and discuss progress on daily workbooks.  Participation Level:  Active  Participation Quality:  Appropriate and Attentive  Affect:  Appropriate  Cognitive:  Alert and Oriented  Insight: Lacking  Engagement in Group:  Engaged and Off Topic  Modes of Intervention:  Discussion, Socialization and Support  Additional Comments:  Pt attended and engaged in wrap up group. She stated that her goal was to prepare for discharge. Relationship with family is improving. Appetite and sleep are both good. No physical pain noted at this time. Denies SI and HI, contracts for safety. Pt rated her day a 10/10.   Kairo Laubacher [name] 05/12/2019, 8:15 PM

## 2019-05-12 NOTE — Progress Notes (Signed)
D:  Patient's self inventory sheet, patient sleeps good, no sleep medication.  Good appetite, high energy level, good concentration.  Denied depression, hopeless and anxiety.  Denied withdrawals.  Checked diarrhea, agitation, irritability.  Denied SI.  Denied physical problems, checked lightheaded, headaches.  Denied physical pain, then checked back and ribs.  Pain medicine helpful.  Goal is see family Suezanne Jacquet.  Get brother Fontaine No. Etc.  Have 5 grandkids.  Does have discharge plans.   A:  Medications administered per MD orders.  Emotional support and encouragement given patient. R:  Denied SI and HI, contracts for safety.  Denied A/V hallucinations.  Safety maintained with 15 minute checks.

## 2019-05-12 NOTE — Progress Notes (Signed)
Recreation Therapy Notes  Date: 11.5.20 Time: 0950 Location: 500 Hall Dayroom  Group Topic: Communication  Goal Area(s) Addresses:  Patient will effectively communicate with peers in group.  Patient will verbalize benefit of healthy communication. Patient will verbalize positive effect of healthy communication on post d/c goals.  Patient will identify communication techniques that made activity effective for group.   Behavioral Response: Engaged  Intervention: Geometric Drawings, pencils, blank paper  Activity: Geometrical Drawings.  Three patients would get the chance to describe a picture to the remainder of the group.  These three individuals could describe their drawings as detailed as possible.  The remainder of the group had to draw the pictures as described to them.  The remainder of the group could not ask any specific questions.  They could only as the presenter to repeat themselves.  Education: Communication, Discharge Planning  Education Outcome: Acknowledges understanding/In group clarification offered/Needs additional education.   Clinical Observations/Feedback:  Pt was all over the place.  Pt was unable to describe the picture to the group.  Pt called out random items for the rest of the group to draw.  During processing pt mentioned something about her kids getting spanked and being in foster care at some point.  Pt had to be redirected to get back on topic.    Victorino Sparrow, LRT/CTRS    Victorino Sparrow A 05/12/2019 11:33 AM

## 2019-05-12 NOTE — Progress Notes (Signed)
   05/12/19 2100  Psych Admission Type (Psych Patients Only)  Admission Status Voluntary  Psychosocial Assessment  Patient Complaints None  Eye Contact Fair  Facial Expression Animated  Affect Anxious  Speech Unremarkable  Interaction Assertive  Motor Activity Fidgety  Appearance/Hygiene Disheveled  Behavior Characteristics Anxious  Mood Anxious  Aggressive Behavior  Effect No apparent injury  Thought Process  Coherency WDL  Content WDL  Delusions None reported or observed  Perception WDL  Hallucination None reported or observed  Judgment Impaired  Confusion Mild  Danger to Self  Current suicidal ideation? Denies  Danger to Others  Danger to Others None reported or observed   Pt visible on the unit this evening. Pt appeared to be delusional and disorganized thought process at times. Pt stated she felt she could live on her own, but pt does not appear to have the capacity to live by herself at this time due to her being disorganized and delusional.

## 2019-05-13 DIAGNOSIS — F22 Delusional disorders: Secondary | ICD-10-CM

## 2019-05-13 LAB — URINALYSIS, COMPLETE (UACMP) WITH MICROSCOPIC
Bacteria, UA: NONE SEEN
Bilirubin Urine: NEGATIVE
Glucose, UA: NEGATIVE mg/dL
Hgb urine dipstick: NEGATIVE
Ketones, ur: NEGATIVE mg/dL
Nitrite: NEGATIVE
Protein, ur: NEGATIVE mg/dL
Specific Gravity, Urine: 1.02 (ref 1.005–1.030)
pH: 5 (ref 5.0–8.0)

## 2019-05-13 MED ORDER — GABAPENTIN 400 MG PO CAPS
400.0000 mg | ORAL_CAPSULE | Freq: Three times a day (TID) | ORAL | Status: DC
  Administered 2019-05-13 – 2019-05-20 (×20): 400 mg via ORAL
  Filled 2019-05-13 (×26): qty 1

## 2019-05-13 MED ORDER — ALUM & MAG HYDROXIDE-SIMETH 200-200-20 MG/5ML PO SUSP
30.0000 mL | ORAL | Status: DC | PRN
  Administered 2019-05-13 – 2019-05-25 (×3): 30 mL via ORAL
  Filled 2019-05-13 (×3): qty 30

## 2019-05-13 MED ORDER — LORAZEPAM 1 MG PO TABS
1.0000 mg | ORAL_TABLET | Freq: Three times a day (TID) | ORAL | Status: AC
  Administered 2019-05-13 – 2019-05-15 (×6): 1 mg via ORAL
  Filled 2019-05-13 (×6): qty 1

## 2019-05-13 NOTE — Progress Notes (Signed)
Recreation Therapy Notes  Date: 11.6.20 Time: 1000 Location: 500 Hall Dayroom  Group Topic: Communication, Team Building, Problem Solving  Goal Area(s) Addresses:  Patient will effectively work with peer towards shared goal.  Patient will identify skill used to make activity successful.  Patient will identify how skills used during activity can be used to reach post d/c goals.   Behavioral Response:  Engaged  Intervention: STEM Activity   Activity: Metallurgist.  In groups, patients were given 12 pipe cleaner.  Patients were to build a free standing tower as tall as possible using just the pipe cleaner.  Throughout the activity, there were setbacks such as putting one hand behind their backs and not being able to talk.  Education: Education officer, community, Dentist.   Education Outcome: Acknowledges education  Clinical Observations/Feedback:  Pt was able to focus long enough to complete the activity.  Pt did have moments of inappropriate laughing.  Pt was able to work with her peer to construct tower as instructed.    Victorino Sparrow, LRT/CTRS         Victorino Sparrow A 05/13/2019 11:17 AM

## 2019-05-13 NOTE — Progress Notes (Signed)
Adult Psychoeducational Group Note  Date:  05/13/2019 Time:  11:39 PM  Group Topic/Focus:  Wrap-Up Group:   The focus of this group is to help patients review their daily goal of treatment and discuss progress on daily workbooks.  Participation Level:  Active  Participation Quality:  Appropriate  Affect:  Appropriate  Cognitive:  Appropriate  Insight: Appropriate  Engagement in Group:  Developing/Improving  Modes of Intervention:  Discussion  Additional Comments:Pt stated her goal for today was to focus on her treatment plan. Pt stated she felt she accomplished her goal today. Pt stated her relationship with her family has improved since she was admitted here. Pt stated been able to contact her sister and brother today, which help improve her overall day. Pt stated she felt better about herself today. Pt rated her overall day a 10. Pt stated her appetite was pretty good today. Pt stated her goal for tonight was to get some sleep. Pt stated she was in  physical pain today. Pt stated her ribs were in pain all day. Pt' nurse was made aware of the situation. Pt stated she was not hearing or seeing anything that was not there. Pt stated she had no thoughts of hurting himself or others. Pt stated if anything change she would alert staff.    Candy Sledge 05/13/2019, 11:39 PM

## 2019-05-13 NOTE — Progress Notes (Signed)
CSW attempted to meet with patient to complete assessment. Patient appeared to be sleeping, she did not respond to CSW knocking at the door or calling her name.  Stephanie Acre, LCSW-A Clinical Social Worker

## 2019-05-13 NOTE — Progress Notes (Signed)
Spirituality group facilitated by Simone Curia, MDiv, BCC.  Group Description:  Group focused on topic of hope.  Patients participated in facilitated discussion around topic, connecting with one another around experiences and definitions for hope.  Group members engaged with visual explorer photos, reflecting on what hope looks like for them today.  Group engaged in discussion around how their definitions of hope are present today in hospital.   Modalities: Psycho-social ed, [place]lerian, Narrative, MI Patient Progress:Allison Bolton was present throughout group.  Engaged with other group members and facilitator around topic.  Allison Bolton noted that patience is one of her most prominent virtues.  Described living with daughter and daughter's s/o - whom she describes as "drinking."  Allison Bolton states she had not consumed substances since 48 years old and expressed pride in her maintaining this sobriety.

## 2019-05-13 NOTE — Progress Notes (Signed)
DAR NOTE:   D-Pt is alert and oriented to person, place, and time. Pt  denied having any HI/SI/AVH.  Pt stated that her level of depression and also her level of anxiety was a 0/10.  Pt reported that she slept well last night. Patient denied having any pain.   A-RN actively listened to pt describe herfeelings.  RN provided support and encouragement.  RN administered pt's medications as prescribed by pt's provider.  Maintained q15 min safety checks.   R-Pt presents with with disorganized speech.  Pt paces in the hallway   and she has dellusions at times. Pt did not have any adverse reactions with her medications.  Pt remains safe on the unit.  Pt stated that she would let RN know if pt needs assistance.
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
## 2019-05-13 NOTE — Progress Notes (Signed)
   05/13/19 2000  Psych Admission Type (Psych Patients Only)  Admission Status Voluntary  Psychosocial Assessment  Patient Complaints Anxiety  Eye Contact Fair  Facial Expression Pensive  Affect Anxious  Speech Slow  Interaction Assertive  Motor Activity Fidgety  Appearance/Hygiene Disheveled  Behavior Characteristics Restless;Anxious  Mood Anxious  Aggressive Behavior  Effect No apparent injury  Thought Process  Coherency WDL  Content WDL  Delusions None reported or observed  Perception WDL  Hallucination None reported or observed  Judgment Impaired  Confusion Mild  Danger to Self  Current suicidal ideation? Denies  Danger to Others  Danger to Others None reported or observed   Pt visible on the unit this evening

## 2019-05-14 LAB — CBC WITH DIFFERENTIAL/PLATELET
Abs Immature Granulocytes: 0.02 10*3/uL (ref 0.00–0.07)
Basophils Absolute: 0 10*3/uL (ref 0.0–0.1)
Basophils Relative: 1 %
Eosinophils Absolute: 0.2 10*3/uL (ref 0.0–0.5)
Eosinophils Relative: 3 %
HCT: 40.2 % (ref 36.0–46.0)
Hemoglobin: 13.3 g/dL (ref 12.0–15.0)
Immature Granulocytes: 0 %
Lymphocytes Relative: 25 %
Lymphs Abs: 1.8 10*3/uL (ref 0.7–4.0)
MCH: 29.1 pg (ref 26.0–34.0)
MCHC: 33.1 g/dL (ref 30.0–36.0)
MCV: 88 fL (ref 80.0–100.0)
Monocytes Absolute: 0.6 10*3/uL (ref 0.1–1.0)
Monocytes Relative: 9 %
Neutro Abs: 4.3 10*3/uL (ref 1.7–7.7)
Neutrophils Relative %: 62 %
Platelets: 322 10*3/uL (ref 150–400)
RBC: 4.57 MIL/uL (ref 3.87–5.11)
RDW: 13.5 % (ref 11.5–15.5)
WBC: 7 10*3/uL (ref 4.0–10.5)
nRBC: 0 % (ref 0.0–0.2)

## 2019-05-14 LAB — HEPATIC FUNCTION PANEL
ALT: 15 U/L (ref 0–44)
AST: 14 U/L — ABNORMAL LOW (ref 15–41)
Albumin: 3.7 g/dL (ref 3.5–5.0)
Alkaline Phosphatase: 52 U/L (ref 38–126)
Bilirubin, Direct: <0.1 mg/dL (ref 0.0–0.2)
Total Bilirubin: 0.4 mg/dL (ref 0.3–1.2)
Total Protein: 6.7 g/dL (ref 6.5–8.1)

## 2019-05-14 LAB — CARBAMAZEPINE LEVEL, TOTAL: Carbamazepine Lvl: 10 ug/mL (ref 4.0–12.0)

## 2019-05-14 MED ORDER — DIVALPROEX SODIUM 250 MG PO DR TAB
250.0000 mg | DELAYED_RELEASE_TABLET | Freq: Three times a day (TID) | ORAL | Status: DC
  Administered 2019-05-14 – 2019-05-15 (×3): 250 mg via ORAL
  Filled 2019-05-14 (×6): qty 1

## 2019-05-14 MED ORDER — ZOLPIDEM TARTRATE 5 MG PO TABS
10.0000 mg | ORAL_TABLET | Freq: Every day | ORAL | Status: DC
  Administered 2019-05-14: 10 mg via ORAL
  Filled 2019-05-14: qty 2

## 2019-05-14 MED ORDER — CARBAMAZEPINE 200 MG PO TABS
200.0000 mg | ORAL_TABLET | Freq: Two times a day (BID) | ORAL | Status: DC
  Administered 2019-05-14 – 2019-05-15 (×2): 200 mg via ORAL
  Filled 2019-05-14 (×4): qty 1

## 2019-05-14 MED ORDER — QUETIAPINE FUMARATE 200 MG PO TABS
200.0000 mg | ORAL_TABLET | Freq: Every day | ORAL | Status: DC
  Administered 2019-05-14: 200 mg via ORAL
  Filled 2019-05-14 (×2): qty 1

## 2019-05-14 MED ORDER — QUETIAPINE FUMARATE 50 MG PO TABS
50.0000 mg | ORAL_TABLET | ORAL | Status: AC
  Administered 2019-05-14: 50 mg via ORAL
  Filled 2019-05-14 (×2): qty 1

## 2019-05-14 MED ORDER — RISPERIDONE 3 MG PO TABS
3.0000 mg | ORAL_TABLET | Freq: Two times a day (BID) | ORAL | Status: DC
  Administered 2019-05-14 – 2019-05-15 (×2): 3 mg via ORAL
  Filled 2019-05-14 (×4): qty 1

## 2019-05-14 NOTE — Progress Notes (Signed)
Patient ID: Allison Bolton, female   DOB: 09-Jan-1971, 48 y.o.   MRN: [id number]   [place] NOVEL CORONAVIRUS (COVID-19) DAILY CHECK-OFF SYMPTOMS - answer yes or no to each - every day NO YES  Have you had a fever in the past 24 hours?  . Fever (Temp > 37.80C / 100F) X   Have you had any of these symptoms in the past 24 hours? . New Cough .  Sore Throat  .  Shortness of Breath .  Difficulty Breathing .  Unexplained Body Aches   X   Have you had any one of these symptoms in the past 24 hours not related to allergies?   . Runny Nose .  Nasal Congestion .  Sneezing   X   If you have had runny nose, nasal congestion, sneezing in the past 24 hours, has it worsened?  X   EXPOSURES - check yes or no X   Have you traveled outside the state in the past 14 days?  X   Have you been in contact with someone with a confirmed diagnosis of COVID-19 or PUI in the past 14 days without wearing appropriate PPE?  X   Have you been living in the same home as a person with confirmed diagnosis of COVID-19 or a PUI (household contact)?    X   Have you been diagnosed with COVID-19?    X              What to do next: Answered NO to all: Answered YES to anything:   Proceed with unit schedule Follow the [facility] Inpatient Flowsheet.
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
## 2019-05-14 NOTE — Progress Notes (Signed)
Pt constantly up throughout the night not sleeping, pt took shower in the middle of the night.

## 2019-05-14 NOTE — [facility] Group Notes (Signed)
  [facility] LCSW Group Therapy Note  Date/Time:  05/14/2019 11:15AM-12:00PM  Type of Therapy and Topic:  Group Therapy:  Feelings About Hospitalization  Participation Level:  Active   Description of Group This process group involved patients discussing their feelings related to being hospitalized, as well as the benefits they see to being in the hospital.  These feelings and benefits were itemized.  The group then brainstormed specific ways in which they could seek those same benefits when they discharge and return home.  Therapeutic Goals 1. Patient will identify and describe positive and negative feelings related to hospitalization 2. Patient will verbalize benefits of hospitalization to themselves personally 3. Patients will brainstorm together ways they can obtain similar benefits in the outpatient setting, identify barriers to wellness and possible solutions  Summary of Patient Progress:  The patient expressed her primary feelings about being hospitalized are "it sucks" and she would love to go home to "stay around family that is positive and supportive."  She was very sleepy and had a hard time even keeping her eyes open.  Therapeutic Modalities Cognitive Behavioral Therapy Motivational Interviewing    Selmer Dominion, LCSW 05/14/2019, 1:55 PM

## 2019-05-14 NOTE — Progress Notes (Signed)
[facility] Group Notes:  (Nursing/MHT/Case Management/Adjunct)  Date:  05/14/2019  Time:  0900  Type of Therapy:  Nurse Education  Participation Level:  Active  Participation Quality:  Appropriate  Affect:  Appropriate  Cognitive:  Appropriate  Insight:  Appropriate  Engagement in Group:  Engaged  Modes of Intervention:  Activity and Discussion  Summary of Progress/Problems: Coloring  activity on challenges the patient is facing and positive coping mechanisms for overcoming challenges.   Vy Badley L 05/14/2019, 9:53 AM
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
## 2019-05-14 NOTE — Progress Notes (Signed)
Patient has been up and down in the dayroom. She c/o rib pain and received tylenol. She also c/o not sleeping well last night. Writer informed her of her hs medications and she reported that hopefully she will get some rest. Support given and safety maintained with 15 min checks.

## 2019-05-15 MED ORDER — RISPERIDONE 2 MG PO TABS: 2.0000 mg | ORAL_TABLET | Freq: Three times a day (TID) | ORAL | Status: DC | PRN

## 2019-05-15 MED ORDER — ZOLPIDEM TARTRATE 5 MG PO TABS
10.0000 mg | ORAL_TABLET | Freq: Every evening | ORAL | Status: DC | PRN
  Administered 2019-05-15 – 2019-05-16 (×2): 10 mg via ORAL
  Filled 2019-05-15 (×3): qty 2

## 2019-05-15 MED ORDER — DIVALPROEX SODIUM 250 MG PO DR TAB
750.0000 mg | DELAYED_RELEASE_TABLET | Freq: Every evening | ORAL | Status: DC
  Administered 2019-05-16 – 2019-05-25 (×10): 750 mg via ORAL
  Filled 2019-05-15 (×13): qty 3

## 2019-05-15 MED ORDER — QUETIAPINE FUMARATE 400 MG PO TABS
400.0000 mg | ORAL_TABLET | Freq: Every day | ORAL | Status: DC
  Administered 2019-05-15 – 2019-05-25 (×11): 400 mg via ORAL
  Filled 2019-05-15 (×13): qty 1
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
## 2019-05-15 NOTE — Progress Notes (Signed)
   05/15/19 0900  Psych Admission Type (Psych Patients Only)  Admission Status Voluntary  Psychosocial Assessment  Patient Complaints Anxiety  Eye Contact Fair  Facial Expression Animated  Affect Anxious  Speech Tangential  Interaction Assertive  Motor Activity Fidgety  Appearance/Hygiene Improved  Behavior Characteristics Anxious  Mood Anxious  Thought Process  Coherency Tangential;Disorganized;Concrete thinking  Content Phobias;Confabulation  Delusions None reported or observed  Perception Derealization  Hallucination None reported or observed  Judgment Impaired  Confusion Mild  Danger to Self  Current suicidal ideation? Denies  Danger to Others  Danger to Others None reported or observed

## 2019-05-15 NOTE — BHH Group Notes (Signed)
[facility] LCSW Group Therapy Note  Date/Time:  05/15/2019  11:00AM-12:00PM  Type of Therapy and Topic:  Group Therapy:  Music and Mood  Participation Level:  Active   Description of Group: In this process group, members listened to a variety of genres of music and identified that different types of music evoke different responses.  Patients were encouraged to identify music that was soothing for them and music that was energizing for them.  Patients discussed how this knowledge can help with wellness and recovery in various ways including managing depression and anxiety as well as encouraging healthy sleep habits.    Therapeutic Goals: 1. Patients will explore the impact of different varieties of music on mood 2. Patients will verbalize the thoughts they have when listening to different types of music 3. Patients will identify music that is soothing to them as well as music that is energizing to them 4. Patients will discuss how to use this knowledge to assist in maintaining wellness and recovery 5. Patients will explore the use of music as a coping skill  Summary of Patient Progress:  At the beginning of group, patient was not present.  She came in and was intrusive at times but could tolerate redirection.  She complained that she had "never been told I'm stuck in group before."  She was up and down switching out magazines to read throughout group.  Eventually when someone in group talked about feeling angry, the patient wanted to talk about what causes her anger.  She was stopped by group leader saying that was not the topic of group today, but encouraging them to discuss afterwards.  She asked if they would get in trouble if they discussed and again CSW said it sounded like a great topic but would need to be discussed after group.  She became angry and asked for permission to leave the group.  She left, then stood in the hall staring in the window.  Therapeutic Modalities: Solution Focused Brief  Therapy Activity   Selmer Dominion, LCSW

## 2019-05-15 NOTE — Progress Notes (Addendum)
   05/15/19 0900  Psych Admission Type (Psych Patients Only)  Admission Status Voluntary  Psychosocial Assessment  Patient Complaints Anxiety  Eye Contact Fair  Facial Expression Animated  Affect Anxious  Speech Tangential  Interaction Assertive  Motor Activity Fidgety  Appearance/Hygiene Improved  Behavior Characteristics Anxious  Mood Anxious  Thought Process  Coherency Tangential;Disorganized;Concrete thinking  Content Phobias;Confabulation  Delusions None reported or observed  Perception Derealization  Hallucination None reported or observed  Judgment Impaired  Confusion Mild  Danger to Self  Current suicidal ideation? Denies  Danger to Others  Danger to Others None reported or observed  Safety maintained with 15 min checks.

## 2019-05-16 MED ORDER — PERPHENAZINE 4 MG PO TABS
4.0000 mg | ORAL_TABLET | Freq: Two times a day (BID) | ORAL | Status: DC
  Administered 2019-05-16 – 2019-05-17 (×3): 4 mg via ORAL
  Filled 2019-05-16 (×7): qty 1

## 2019-05-16 NOTE — Progress Notes (Signed)
[facility] MD Progress Note  05/16/2019 10:30 AM Allison Bolton  MRN:  [id number] Subjective:    Patient continues to express various delusions centered around her children/family members but they are highly variable.  At one point she believed one of the other patients here was a son of hers so forth and she still tends to claim that even though this individual has been discharged.  She denies hallucinations she can give coherent answers for about 30 seconds to a minute before she begins speaking in delusional terms so it is unclear as to whether her delusional disorder/delusional statements will ever clear up but her behavior is contained Principal Problem: Chronic psychotic disorder/acute exacerbation Diagnosis: Active Problems:   Delusional disorder ([place])   Bipolar I disorder, current or most recent episode manic, with psychotic features ([place])  Total Time spent with patient: 30 minutes  Past Psychiatric History: No new data  Past Medical History:  Past Medical History:  Diagnosis Date  . Arthritis   . Asthma   . Bipolar 1 disorder ([place])   . Diverticulosis   . Dysrhythmia    sts "I have heart palpitations"  . Fibromyalgia   . H/O hiatal hernia    4  . Headache(784.0)   . Meniere's disease   . S/P colonoscopy    Dr. Laural Golden 2010: few small diverticula at sigmoid. otherwise normal.   . Shortness of breath     Past Surgical History:  Procedure Laterality Date  . ABDOMINAL HYSTERECTOMY    . APPENDECTOMY    . CESAREAN SECTION    . CHOLECYSTECTOMY    . exploratory laparoscopy    . FOOT SURGERY    . HEMORRHOID SURGERY    . LAPAROSCOPIC APPENDECTOMY  02/19/2011   Procedure: APPENDECTOMY LAPAROSCOPIC;  Surgeon: Jamesetta So;  Location: [facility];  Service: General;  Laterality: N/A;  . LAPAROSCOPY  02/19/2011   Procedure: LAPAROSCOPY DIAGNOSTIC;  Surgeon: Jamesetta So;  Location: [facility];  Service: General;  Laterality: N/A;  . left ovarian removal    . multiple hernia repairs     . Right ovarian removal    . TONSILLECTOMY    . TONSILLECTOMY AND ADENOIDECTOMY    . tubes in ears    . UMBILICAL HERNIA REPAIR  Dec 2011   Dr. Arnoldo Morale  . wisdom teeth removal     Family History:  Family History  Problem Relation Age of Onset  . Thyroid disease Mother   . Colon cancer Other        paternal and maternal grandfather  . Meniere's disease Other   . Colon cancer Father   . Colon cancer Brother   . Anesthesia problems Neg Hx   . Hypotension Neg Hx   . Malignant hyperthermia Neg Hx   . Pseudochol deficiency Neg Hx    Family Psychiatric  History: No new data Social History:  Social History   Substance and Sexual Activity  Alcohol Use No   Comment: not since June 2012     Social History   Substance and Sexual Activity  Drug Use No   Comment: patient denies any-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    Social History   Socioeconomic History  . Marital status: Legally Separated    Spouse name: Not on file  . Number of children: Not on file  . Years of education: Not on file  . Highest education level: Not on file  Occupational History  . Not on file  Social Needs  . Financial resource strain: Not on file  . Food insecurity    Worry: Not on file    Inability: Not on file  . Transportation needs    Medical: Not on file    Non-medical: Not on file  Tobacco Use  . Smoking status: Former Smoker    Packs/day: 0.50    Years: 20.00    Pack years: 10.00    Types: Cigarettes    Start date: 07/07/1981    Quit date: 11/05/2018    Years since quitting: 0.5  . Smokeless tobacco: Never Used  Substance and Sexual Activity  . Alcohol use: No    Comment: not since June 2012  . 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 Sexual activity: Never    Birth control/protection: Surgical  Lifestyle  . Physical activity    Days per week: Not on file    Minutes per session: Not on file  . Stress: Not on file   Relationships  . Social Herbalist on phone: Not on file    Gets together: Not on file    Attends religious service: Not on file    Active member of club or organization: Not on file    Attends meetings of clubs or organizations: Not on file    Relationship status: Not on file  Other Topics Concern  . Not on file  Social History Narrative  . Not on file   Additional Social History:                         Sleep: Fair  Appetite:  Fair  Current Medications: Current Facility-Administered Medications  Medication Dose Route Frequency Provider Last Rate Last Dose  . acetaminophen (TYLENOL) tablet 650 mg  650 mg Oral Q6H PRN Deloria Lair, NP   650 mg at 05/15/19 2200  . alum & mag hydroxide-simeth (MAALOX/MYLANTA) 200-200-20 MG/5ML suspension 30 mL  30 mL Oral Q4H PRN Lindell Spar I, NP   30 mL at 05/13/19 2317  . benztropine (COGENTIN) tablet 1 mg  1 mg Oral BID Johnn Hai, MD   1 mg at 05/16/19 0803  . divalproex (DEPAKOTE) DR tablet 750 mg  750 mg Oral QPM Sharma Covert, MD      . gabapentin (NEURONTIN) capsule 400 mg  400 mg Oral TID Sharma Covert, MD   400 mg at 05/16/19 0803  . magnesium hydroxide (MILK OF MAGNESIA) suspension 30 mL  30 mL Oral Daily PRN Anike, Adaku C, NP   30 mL at 05/12/19 0526  . naproxen (NAPROSYN) tablet 500 mg  500 mg Oral BID Mordecai Maes, NP   500 mg at 05/16/19 0803  . pantoprazole (PROTONIX) EC tablet 40 mg  40 mg Oral Daily Mordecai Maes, NP   40 mg at 05/16/19 0803  . perphenazine (TRILAFON) tablet 4 mg  4 mg Oral BID Johnn Hai, MD      . QUEtiapine (SEROQUEL) tablet 400 mg  400 mg Oral QHS Sharma Covert, MD   400 mg at 05/15/19 2110  . traZODone (DESYREL) tablet 200 mg  200 mg Oral QHS Johnn Hai, MD   200 mg at 05/15/19 2110  . zolpidem (AMBIEN) tablet 10 mg  10 mg Oral QHS PRN Sharma Covert, MD   10 mg at 05/15/19 2201    Lab Results: No results  found for this or any previous visit (from  the past 48 hour(s)).  Blood Alcohol level:  Lab Results  Component Value Date   ETH <10 05/07/2019   ETH <10 [id number]    Metabolic Disorder Labs: Lab Results  Component Value Date   HGBA1C 5.3 05/11/2019   MPG 105.41 05/11/2019   MPG 114 05/13/2013   Lab Results  Component Value Date   PROLACTIN 24.1 (H) 05/11/2019   Lab Results  Component Value Date   CHOL 139 05/11/2019   TRIG 66 05/11/2019   HDL 44 05/11/2019   CHOLHDL 3.2 05/11/2019   VLDL 13 05/11/2019   LDLCALC 82 05/11/2019   LDLCALC 88 05/13/2013    Physical Findings: AIMS: Facial and Oral Movements Muscles of Facial Expression: None, normal Lips and Perioral Area: None, normal Jaw: None, normal Tongue: None, normal,Extremity Movements Upper (arms, wrists, hands, fingers): None, normal Lower (legs, knees, ankles, toes): None, normal, Trunk Movements Neck, shoulders, hips: None, normal, Overall Severity Severity of abnormal movements (highest score from questions above): None, normal Incapacitation due to abnormal movements: None, normal Patient's awareness of abnormal movements (rate only patient's report): No Awareness, Dental Status Current problems with teeth and/or dentures?: No Does patient usually wear dentures?: No  CIWA:  CIWA-Ar Total: 1 COWS:  COWS Total Score: 1  Musculoskeletal: Strength & Muscle Tone: within normal limits Gait & Station: normal Patient leans: N/A  Psychiatric Specialty Exam: Physical Exam  ROS  Blood pressure (!) 137/99, pulse 93, temperature (!) 97.3 F (36.3 C), temperature source Oral, resp. rate 16, height 5\' 6"  (1.676 m), weight 86.6 kg, SpO2 97 %.Body mass index is 30.83 kg/m.  General Appearance: Casual  Eye Contact:  Good  Speech:  Clear and Coherent  Volume:  Normal  Mood:  Euthymic  Affect:  Congruent  Thought Process:  Irrelevant and Descriptions of Associations: Circumstantial  Orientation:  Full (Time, Place, and Person)  Thought Content:   Illogical and Delusions  Suicidal Thoughts:  No  Homicidal Thoughts: Denies  Memory:  Immediate;   Poor Recent;   Poor Remote;   Fair  Judgement:  Poor  Insight:  Shallow  Psychomotor Activity:  Normal  Concentration:  Concentration: Fair and Attention Span: Fair  Recall:  [employer] of Knowledge:  Fair  Language:  Good  Akathisia:  Negative  Handed:  Right  AIMS (if indicated):     Assets:  Communication Skills Desire for Improvement Leisure Time Physical Health  ADL's:  Intact  Cognition:  WNL  Sleep:  Number of Hours: 6.75     Treatment Plan Summary: Daily contact with patient to assess and evaluate symptoms and progress in treatment and Medication management  Continue antipsychotics but is clear that she is going to need a combination we have added perphenazine continue mood stabilizer therapy no change in precautions  Maryjane Benedict, MD 05/16/2019, 10:30 AM

## 2019-05-16 NOTE — Progress Notes (Signed)
DAR NOTE:   D-Pt is alert and oriented to person, place, and time.   Pt  denied having any HI/SI/AVH. Per patient self inventory sheet:  Pt rated both her depression and anxiety at a level of 0 out of 10.   A- RN provided support and encouragement.  RN administered pt's medications as prescribed by pt's provider.  Maintained q15 min safety checks.   R-Pt presented with disorganized thoughts.  Pt was delusional and her mood was labile.  Pt is observed walking up and down the hallway, laughing hysterically at times.  Pt remains safe on the unit.  Pt makes her needs known.  Pt responds well to redirection when needed.

## 2019-05-16 NOTE — [facility] Group Notes (Signed)
[facility] LCSW Group Therapy Note  Date/Time: 05/16/2019 @ 1:30pm  Type of Therapy and Topic:  Group Therapy:  Overcoming Obstacles  Participation Level:  [facility] PARTICIPATION LEVEL: Active  Description of Group:    In this group patients will be encouraged to explore what they see as obstacles to their own wellness and recovery. They will be guided to discuss their thoughts, feelings, and behaviors related to these obstacles. The group will process together ways to cope with barriers, with attention given to specific choices patients can make. Each patient will be challenged to identify changes they are motivated to make in order to overcome their obstacles. This group will be process-oriented, with patients participating in exploration of their own experiences as well as giving and receiving support and challenge from other group members.  Therapeutic Goals: 1. Patient will identify personal and current obstacles as they relate to admission. 2. Patient will identify barriers that currently interfere with their wellness or overcoming obstacles.  3. Patient will identify feelings, thought process and behaviors related to these barriers. 4. Patient will identify two changes they are willing to make to overcome these obstacles:    Summary of Patient Progress   Patient was active and engaged throughout group therapy today. Patient was able to identify person and current obstacles which are her past and her MS diagnosis. Patient was able to identify bariers that currently interfere with the overcoming of her obstacles which is her family and people holding her back. Patient stated that she is trying to get custody of her children but her family makes that hard for her. Patient was able to identify changes that she is willing to make to overcome her obstacles which is talking to a lawyer/going to court to get her children back, distance herself from her negative family, and going back to her doctor for her  MS.   Therapeutic Modalities:   Cognitive Behavioral Therapy Solution Focused Therapy Motivational Interviewing Relapse Prevention Therapy   Ardelle Anton, LCSW

## 2019-05-16 NOTE — Tx Team (Signed)
Interdisciplinary Treatment and Diagnostic Plan Update  05/16/2019 Time of Session: 11:10am Allison Bolton MRN: [id number]  Principal Diagnosis: <principal problem not specified>  Secondary Diagnoses: Active Problems:   Delusional disorder ([place])   Bipolar I disorder, current or most recent episode manic, with psychotic features ([place])   Current Medications:  Current Facility-Administered Medications  Medication Dose Route Frequency Provider Last Rate Last Dose  . acetaminophen (TYLENOL) tablet 650 mg  650 mg Oral Q6H PRN Deloria Lair, NP   650 mg at 05/15/19 2200  . alum & mag hydroxide-simeth (MAALOX/MYLANTA) 200-200-20 MG/5ML suspension 30 mL  30 mL Oral Q4H PRN Lindell Spar I, NP   30 mL at 05/13/19 2317  . benztropine (COGENTIN) tablet 1 mg  1 mg Oral BID Johnn Hai, MD   1 mg at 05/16/19 0803  . divalproex (DEPAKOTE) DR tablet 750 mg  750 mg Oral QPM Sharma Covert, MD      . gabapentin (NEURONTIN) capsule 400 mg  400 mg Oral TID Sharma Covert, MD   400 mg at 05/16/19 1149  . magnesium hydroxide (MILK OF MAGNESIA) suspension 30 mL  30 mL Oral Daily PRN Anike, Adaku C, NP   30 mL at 05/12/19 0526  . naproxen (NAPROSYN) tablet 500 mg  500 mg Oral BID Mordecai Maes, NP   500 mg at 05/16/19 0803  . pantoprazole (PROTONIX) EC tablet 40 mg  40 mg Oral Daily Mordecai Maes, NP   40 mg at 05/16/19 0803  . perphenazine (TRILAFON) tablet 4 mg  4 mg Oral BID Johnn Hai, MD   4 mg at 05/16/19 1149  . QUEtiapine (SEROQUEL) tablet 400 mg  400 mg Oral QHS Sharma Covert, MD   400 mg at 05/15/19 2110  . traZODone (DESYREL) tablet 200 mg  200 mg Oral QHS Johnn Hai, MD   200 mg at 05/15/19 2110  . zolpidem (AMBIEN) tablet 10 mg  10 mg Oral QHS PRN Sharma Covert, MD   10 mg at 05/15/19 2201   PTA Medications: Medications Prior to Admission  Medication Sig Dispense Refill Last Dose  . acetaminophen (TYLENOL) 325 MG tablet Take 650 mg by mouth every 6 (six) hours  as needed.     . gabapentin (NEURONTIN) 300 MG capsule TAKE 1 CAPSULE BY MOUTH THREE TIMES A DAY     . lamoTRIgine (LAMICTAL) 200 MG tablet Take 200 mg by mouth daily.     Marland Kitchen LATUDA 80 MG TABS tablet Take 0.5 tablets by mouth 2 (two) times daily.     . naproxen (NAPROSYN) 500 MG tablet Take 1 tablet (500 mg total) by mouth 2 (two) times daily. 30 tablet 0   . omeprazole (PRILOSEC) 20 MG capsule Take 20 mg by mouth daily.     . QUEtiapine (SEROQUEL) 200 MG tablet Take 350 mg by mouth at bedtime.      . topiramate (TOPAMAX) 25 MG tablet Take 25 mg by mouth daily.     . traZODone (DESYREL) 100 MG tablet Take 100 mg by mouth at bedtime.       Patient Stressors: Marital or family conflict Medication change or noncompliance  Patient Strengths: Technical sales engineer for treatment/growth  Treatment Modalities: Medication Management, Group therapy, Case management,  1 to 1 session with clinician, Psychoeducation, Recreational therapy.   Physician Treatment Plan for Primary Diagnosis: <principal problem not specified> Long Term Goal(s): Improvement in symptoms so as ready for discharge Improvement in symptoms so as  ready for discharge   Short Term Goals: Ability to disclose and discuss suicidal ideas Ability to demonstrate self-control will improve Ability to identify and develop effective coping behaviors will improve Ability to maintain clinical measurements within normal limits will improve Compliance with prescribed medications will improve Ability to disclose and discuss suicidal ideas Ability to demonstrate self-control will improve Ability to identify and develop effective coping behaviors will improve Ability to maintain clinical measurements within normal limits will improve Compliance with prescribed medications will improve  Medication Management: Evaluate patient's response, side effects, and tolerance of medication regimen.  Therapeutic Interventions: 1 to 1  sessions, Unit Group sessions and Medication administration.  Evaluation of Outcomes: Progressing  Physician Treatment Plan for Secondary Diagnosis: Active Problems:   Delusional disorder ([place])   Bipolar I disorder, current or most recent episode manic, with psychotic features ([place])  Long Term Goal(s): Improvement in symptoms so as ready for discharge Improvement in symptoms so as ready for discharge   Short Term Goals: Ability to disclose and discuss suicidal ideas Ability to demonstrate self-control will improve Ability to identify and develop effective coping behaviors will improve Ability to maintain clinical measurements within normal limits will improve Compliance with prescribed medications will improve Ability to disclose and discuss suicidal ideas Ability to demonstrate self-control will improve Ability to identify and develop effective coping behaviors will improve Ability to maintain clinical measurements within normal limits will improve Compliance with prescribed medications will improve     Medication Management: Evaluate patient's response, side effects, and tolerance of medication regimen.  Therapeutic Interventions: 1 to 1 sessions, Unit Group sessions and Medication administration.  Evaluation of Outcomes: Progressing   RN Treatment Plan for Primary Diagnosis: <principal problem not specified> Long Term Goal(s): Knowledge of disease and therapeutic regimen to maintain health will improve  Short Term Goals: Ability to participate in decision making will improve, Ability to verbalize feelings will improve, Ability to disclose and discuss suicidal ideas, Ability to identify and develop effective coping behaviors will improve and Compliance with prescribed medications will improve  Medication Management: RN will administer medications as ordered by provider, will assess and evaluate patient's response and provide education to patient for prescribed medication. RN will  report any adverse and/or side effects to prescribing provider.  Therapeutic Interventions: 1 on 1 counseling sessions, Psychoeducation, Medication administration, Evaluate responses to treatment, Monitor vital signs and CBGs as ordered, Perform/monitor CIWA, COWS, AIMS and Fall Risk screenings as ordered, Perform wound care treatments as ordered.  Evaluation of Outcomes: Progressing   LCSW Treatment Plan for Primary Diagnosis: <principal problem not specified> Long Term Goal(s): Safe transition to appropriate next level of care at discharge, Engage patient in therapeutic group addressing interpersonal concerns.  Short Term Goals: Engage patient in aftercare planning with referrals and resources and Increase skills for wellness and recovery  Therapeutic Interventions: Assess for all discharge needs, 1 to 1 time with Social worker, Explore available resources and support systems, Assess for adequacy in community support network, Educate family and significant other(s) on suicide prevention, Complete Psychosocial Assessment, Interpersonal group therapy.  Evaluation of Outcomes: Progressing   Progress in Treatment: Attending groups: Yes. Participating in groups: Yes. Taking medication as prescribed: Yes. Toleration medication: Yes. Family/Significant other contact made: No, will contact:  pt's friend whom she lives with Patient understands diagnosis: Yes. Discussing patient identified problems/goals with staff: Yes. Medical problems stabilized or resolved: Yes. Denies suicidal/homicidal ideation: Yes. Issues/concerns per patient self-inventory: No. Other:   New problem(s) identified: No, Describe:  None  New Short Term/Long Term Goal(s): Medication stabilization, elimination of SI thoughts, and development of a comprehensive mental wellness plan.   Patient Goals:  Patient did not attend treatment team  Discharge Plan or Barriers: CSW will continue to follow up for appropriate  referrals and possible discharge planning. Patient is going to follow up with her PCP for medication management and [facility] in [place] for her therapy.   Reason for Continuation of Hospitalization: Delusions  Mania Medication stabilization  Estimated Length of Stay: 2-3 days   Attendees: Patient: 05/16/2019  Physician: Dr. Johnn Hai, MD 05/16/2019   Nursing: Benjamine Mola, RN 05/16/2019   RN Care Manager: 05/16/2019  Social Worker: Ardelle Anton, LCSW 05/16/2019  Recreational Therapist:  05/16/2019   Other:  05/16/2019  Other:  05/16/2019   Other: 05/16/2019      Scribe for Treatment Team: Trecia Rogers, LCSW 05/16/2019 2:16 PM

## 2019-05-16 NOTE — Progress Notes (Signed)
Pt up to the nursing station numerous times stating she wanted to sleep in the quiet room. Pt appears to have an issue sleeping in the room with a roommate. Pt comes to the nursing station making bizarre statements" I don't want to live with a married couple anymore, my brother should be getting off now" . Pt had to be redirected.

## 2019-05-16 NOTE — Progress Notes (Signed)
Adult Psychoeducational Group Note  Date:  05/16/2019 Time:  12:54 AM  Group Topic/Focus:  Wrap-Up Group:   The focus of this group is to help patients review their daily goal of treatment and discuss progress on daily workbooks.  Participation Level:  Active  Participation Quality:  Appropriate  Affect:  Appropriate  Cognitive:  Appropriate  Insight: Appropriate  Engagement in Group:  Developing/Improving  Modes of Intervention:  Discussion  Additional Comments: Pt stated her goal for today was to focus on her treatment plan. Pt stated she felt she accomplished her goal today. Pt stated her relationship with her family has improved since she was admitted here. Pt stated been able to contact her family today, which help improve her overall day. Pt stated she felt better about herself today. Pt rated her overall day a 10. Pt stated her appetite was pretty good today. Pt stated her goal for tonight was to get some sleep. Pt stated she was in no  physical pain today.  Pt stated she was not hearing or seeing anything that was not there. Pt stated she had no thoughts of hurting himself or others. Pt stated if anything change she would alert staff.      Candy Sledge 05/16/2019, 12:54 AM

## 2019-05-16 NOTE — Progress Notes (Signed)
Recreation Therapy Notes  Date: 11.9.20 Time: 1000 Location: 500 Hall Dayroom  Group Topic: Coping Skills  Goal Area(s) Addresses:  Patient will identify positive coping skills. Patient will identify benefits of using positive coping skills.  Behavioral Response: Minimal  Intervention: Worksheet, pencils  Activity: Mind Map.  LRT and patients filled in the first 8 boxes of the mind map (depression, getting into trouble, anger, anxiety, fear, isolation, relationships and finances) together.  Patients were then given time to come up with 3 positive coping skills for each situation identified by the group.    Education: Radiographer, therapeutic, Dentist.   Education Outcome: Acknowledges understanding/In group clarification offered/Needs additional education.   Clinical Observations/Feedback: Pt had some inappropriate laughing throughout group.  Pt also gave some answers that had nothing to do with the topic in group.  Pt was able to identify a few coping skills such as calling for help- getting in trouble; don't start fights- anger and accounting for finances.    Victorino Sparrow, LRT/CTRS   Victorino Sparrow A 05/16/2019 11:45 AM

## 2019-05-16 NOTE — [facility] Suicide Risk Assessment (Signed)
[facility] INPATIENT:  Family/Significant Other Suicide Prevention Education  Suicide Prevention Education:  Contact Attempts: Pt's friend whom she lives with, Lance Morin, has been identified by the patient as the family member/significant other with whom the patient will be residing, and identified as the person(s) who will aid the patient in the event of a mental health crisis.  With written consent from the patient, two attempts were made to provide suicide prevention education, prior to and/or following the patient's discharge.  We were unsuccessful in providing suicide prevention education.  A suicide education pamphlet was given to the patient to share with family/significant other.  Date and time of first attempt: 11/9 @ 1:40am Date and time of second attempt:  Trecia Rogers 05/16/2019, 10:49 AM

## 2019-05-16 NOTE — Progress Notes (Signed)
   05/16/19 2100  Psych Admission Type (Psych Patients Only)  Admission Status Voluntary  Psychosocial Assessment  Patient Complaints Anxiety;Suspiciousness  Eye Contact Fair  Facial Expression Animated  Affect Anxious  Sales promotion account executive Activity Fidgety  Appearance/Hygiene Improved  Behavior Characteristics Anxious;Pacing  Mood Anxious;Suspicious;Preoccupied  Thought Process  Coherency Tangential;Disorganized  Content Delusions;Preoccupation  Delusions Referential  Perception Derealization  Hallucination None reported or observed  Judgment Poor  Confusion Mild  Danger to Self  Current suicidal ideation? Denies  Danger to Others  Danger to Others None reported or observed   Pt visible on the unit interacting with peers. Pt inappropriate laughing at times , but appropriate behavior.

## 2019-05-17 MED ORDER — LORAZEPAM 2 MG/ML IJ SOLN: 2.0000 mg | INTRAMUSCULAR | Status: DC | PRN

## 2019-05-17 MED ORDER — QUETIAPINE FUMARATE 50 MG PO TABS: 50.0000 mg | ORAL_TABLET | ORAL | Status: DC | PRN

## 2019-05-17 MED ORDER — PERPHENAZINE 8 MG PO TABS
8.0000 mg | ORAL_TABLET | Freq: Two times a day (BID) | ORAL | Status: DC
  Administered 2019-05-17 – 2019-05-18 (×2): 8 mg via ORAL
  Filled 2019-05-17 (×4): qty 1

## 2019-05-17 MED ORDER — LORAZEPAM 1 MG PO TABS
2.0000 mg | ORAL_TABLET | ORAL | Status: DC | PRN
  Administered 2019-05-17 – 2019-05-19 (×3): 2 mg via ORAL
  Filled 2019-05-17 (×3): qty 2

## 2019-05-17 NOTE — Progress Notes (Signed)
Adult Psychoeducational Group Note  Date:  05/17/2019 Time:  12:27 AM  Group Topic/Focus:  Wrap-Up Group:   The focus of this group is to help patients review their daily goal of treatment and discuss progress on daily workbooks.  Participation Level:  Active  Participation Quality:  Appropriate  Affect:  Appropriate  Cognitive:  Appropriate  Insight: Appropriate  Engagement in Group:  Developing/Improving  Modes of Intervention:  Discussion  Additional Comments:  Pt stated her goal for today was to focus on her treatment plan. Pt stated she felt she accomplished her goal today. Pt stated her relationship with her family has improved since she was admitted here. Pt stated been able to contact her daughter today, whichhelp improve her overall day. Pt stated she felt betterabout herself today. Pt rated her overall day a10. Pt stated her appetite was pretty good today. Pt stated her goal for tonight was to get some sleep. Pt stated she was in no physical pain today.Pt stated she was not hearing or seeing anything that was not there. Pt stated she had no thoughts of hurting himself or others. Pt stated if anything change she would alert staff.  Candy Sledge 05/17/2019, 12:27 AM

## 2019-05-17 NOTE — [facility] Suicide Risk Assessment (Signed)
[name] INPATIENT:  Family/Significant Other Suicide Prevention Education  Suicide Prevention Education:  Education Completed; Pt's friend, Allison Bolton, has been identified by the patient as the family member/significant other with whom the patient will be residing, and identified as the person(s) who will aid the patient in the event of a mental health crisis (suicidal ideations/suicide attempt).  With written consent from the patient, the family member/significant other has been provided the following suicide prevention education, prior to the and/or following the discharge of the patient.  The suicide prevention education provided includes the following:  Suicide risk factors  Suicide prevention and interventions  National Suicide Hotline telephone number  [facility] assessment telephone number  [facility] Emergency Assistance [place] and/or Residential Mobile Crisis Unit telephone number  Request made of family/significant other to:  Remove weapons (e.g., guns, rifles, knives), all items previously/currently identified as safety concern.    Remove drugs/medications (over-the-counter, prescriptions, illicit drugs), all items previously/currently identified as a safety concern.  The family member/significant other verbalizes understanding of the suicide prevention education information provided.  The family member/significant other agrees to remove the items of safety concern listed above.   CSW contacted pt's friend, Allison Bolton. Pt's friend stated that the patient lives with her and her husband. Pt's friend stated that she doesn't have any questions but did state that her husband may have some questions but he currently is working and has to go to work early in the morning. Pt's friend stated that the patient will be picked up tomorrow.   Allison Bolton 05/17/2019, 3:23 PM

## 2019-05-17 NOTE — Progress Notes (Signed)
DAR NOTE:  D-Pt is alert and oriented to person, place, and time.   Pt denied having any HI/SI/AVH. Per patient self inventory sheet:  Pt rated both her depression and anxiety at a level of 0 out of 10.  A- RN actively listened to pt express her thoughts and feelings.  RN provided support and encouragement. RN administered pt's medications as prescribed by pt's provider. Maintained q15 min safety checks.  R-Pt presented with disorganized thoughts.  Pt was delusional and her mood was labile. Pt responds well to redirection and reassurance.  Pt remains safe on the unit.  Pt said that she would reach out to staff if pt needs assistance.

## 2019-05-17 NOTE — Progress Notes (Signed)
[facility] MD Progress Note  05/17/2019 1:41 PM Allison Bolton  MRN:  [id number] Subjective:   Patient certainly contained behaviorally and generally cooperative she is oriented to person place situation she still expresses however several delusional believes and more specifically tends to name individuals as children of hers that are simply not, but not including other patients today.  She denies hallucinations her insight is just lacking regarding her delusional thought but she does indeed know she has a mental disorder, however ill-defined in her own thoughts Principal Problem: Recent mania and delusional believes that may be fixed Diagnosis: Active Problems:   Delusional disorder (HCC)   Bipolar I disorder, current or most recent episode manic, with psychotic features ([place])  Total Time spent with patient: 20 minutes  Past Psychiatric History: Extensive  Past Medical History:  Past Medical History:  Diagnosis Date  . Arthritis   . Asthma   . Bipolar 1 disorder ([place])   . Diverticulosis   . Dysrhythmia    sts "I have heart palpitations"  . Fibromyalgia   . H/O hiatal hernia    4  . Headache(784.0)   . Meniere's disease   . S/P colonoscopy    Dr. Laural Golden 2010: few small diverticula at sigmoid. otherwise normal.   . Shortness of breath     Past Surgical History:  Procedure Laterality Date  . ABDOMINAL HYSTERECTOMY    . APPENDECTOMY    . CESAREAN SECTION    . CHOLECYSTECTOMY    . exploratory laparoscopy    . FOOT SURGERY    . HEMORRHOID SURGERY    . LAPAROSCOPIC APPENDECTOMY  02/19/2011   Procedure: APPENDECTOMY LAPAROSCOPIC;  Surgeon: Jamesetta So;  Location: [facility];  Service: General;  Laterality: N/A;  . LAPAROSCOPY  02/19/2011   Procedure: LAPAROSCOPY DIAGNOSTIC;  Surgeon: Jamesetta So;  Location: [facility];  Service: General;  Laterality: N/A;  . left ovarian removal    . multiple hernia repairs    . Right ovarian removal    . TONSILLECTOMY    . TONSILLECTOMY AND  ADENOIDECTOMY    . tubes in ears    . UMBILICAL HERNIA REPAIR  Dec 2011   Dr. Arnoldo Morale  . wisdom teeth removal     Family History:  Family History  Problem Relation Age of Onset  . Thyroid disease Mother   . Colon cancer Other        paternal and maternal grandfather  . Meniere's disease Other   . Colon cancer Father   . Colon cancer Brother   . Anesthesia problems Neg Hx   . Hypotension Neg Hx   . Malignant hyperthermia Neg Hx   . Pseudochol deficiency Neg Hx    Family Psychiatric  History: No new data shared Social History:  Social History   Substance and Sexual Activity  Alcohol Use No   Comment: not since June 2012     Social History   Substance and Sexual Activity  Drug Use No   Comment: patient denies any-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    Social History   Socioeconomic History  . Marital status: Legally Separated    Spouse name: Not on file  . Number of children: Not on file  . Years of education: Not on file  . Highest education level: Not on file  Occupational History  . Not on file  Social Needs  . Financial resource strain: Not on file  . Food insecurity    Worry:  Not on file    Inability: Not on file  . Transportation needs    Medical: Not on file    Non-medical: Not on file  Tobacco Use  . Smoking status: Former Smoker    Packs/day: 0.50    Years: 20.00    Pack years: 10.00    Types: Cigarettes    Start date: 07/07/1981    Quit date: 11/05/2018    Years since quitting: 0.5  . Smokeless tobacco: Never Used  Substance and Sexual Activity  . Alcohol use: No    Comment: not since June 2012  . 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 Sexual activity: Never    Birth control/protection: Surgical  Lifestyle  . Physical activity    Days per week: Not on file    Minutes per session: Not on file  . Stress: Not on file  Relationships  . Social Herbalist on phone: Not on file     Gets together: Not on file    Attends religious service: Not on file    Active member of club or organization: Not on file    Attends meetings of clubs or organizations: Not on file    Relationship status: Not on file  Other Topics Concern  . Not on file  Social History Narrative  . Not on file   Additional Social History:                         Sleep: Just under 4 hours  Appetite:  Good  Current Medications: Current Facility-Administered Medications  Medication Dose Route Frequency Provider Last Rate Last Dose  . acetaminophen (TYLENOL) tablet 650 mg  650 mg Oral Q6H PRN Deloria Lair, NP   650 mg at 05/15/19 2200  . alum & mag hydroxide-simeth (MAALOX/MYLANTA) 200-200-20 MG/5ML suspension 30 mL  30 mL Oral Q4H PRN Lindell Spar I, NP   30 mL at 05/13/19 2317  . benztropine (COGENTIN) tablet 1 mg  1 mg Oral BID Johnn Hai, MD   1 mg at 05/17/19 0753  . divalproex (DEPAKOTE) DR tablet 750 mg  750 mg Oral QPM Sharma Covert, MD   750 mg at 05/16/19 1726  . gabapentin (NEURONTIN) capsule 400 mg  400 mg Oral TID Sharma Covert, MD   400 mg at 05/17/19 1112  . magnesium hydroxide (MILK OF MAGNESIA) suspension 30 mL  30 mL Oral Daily PRN Anike, Adaku C, NP   30 mL at 05/12/19 0526  . naproxen (NAPROSYN) tablet 500 mg  500 mg Oral BID Mordecai Maes, NP   500 mg at 05/17/19 0753  . pantoprazole (PROTONIX) EC tablet 40 mg  40 mg Oral Daily Mordecai Maes, NP   40 mg at 05/17/19 0753  . perphenazine (TRILAFON) tablet 4 mg  4 mg Oral BID Johnn Hai, MD   4 mg at 05/17/19 0753  . QUEtiapine (SEROQUEL) tablet 400 mg  400 mg Oral QHS Sharma Covert, MD   400 mg at 05/16/19 2109  . traZODone (DESYREL) tablet 200 mg  200 mg Oral QHS Johnn Hai, MD   200 mg at 05/16/19 2108  . zolpidem (AMBIEN) tablet 10 mg  10 mg Oral QHS PRN Sharma Covert, MD   10 mg at 05/16/19 2255    Lab Results: No results found for this or any previous visit (from the  past 48  hour(s)).  Blood Alcohol level:  Lab Results  Component Value Date   ETH <10 05/07/2019   ETH <10 [id number]    Metabolic Disorder Labs: Lab Results  Component Value Date   HGBA1C 5.3 05/11/2019   MPG 105.41 05/11/2019   MPG 114 05/13/2013   Lab Results  Component Value Date   PROLACTIN 24.1 (H) 05/11/2019   Lab Results  Component Value Date   CHOL 139 05/11/2019   TRIG 66 05/11/2019   HDL 44 05/11/2019   CHOLHDL 3.2 05/11/2019   VLDL 13 05/11/2019   LDLCALC 82 05/11/2019   LDLCALC 88 05/13/2013    Physical Findings: AIMS: Facial and Oral Movements Muscles of Facial Expression: None, normal Lips and Perioral Area: None, normal Jaw: None, normal Tongue: None, normal,Extremity Movements Upper (arms, wrists, hands, fingers): None, normal Lower (legs, knees, ankles, toes): None, normal, Trunk Movements Neck, shoulders, hips: None, normal, Overall Severity Severity of abnormal movements (highest score from questions above): None, normal Incapacitation due to abnormal movements: None, normal Patient's awareness of abnormal movements (rate only patient's report): No Awareness, Dental Status Current problems with teeth and/or dentures?: No Does patient usually wear dentures?: No  CIWA:  CIWA-Ar Total: 1 COWS:  COWS Total Score: 1  Musculoskeletal: Strength & Muscle Tone: within normal limits Gait & Station: normal Patient leans: N/A  Psychiatric Specialty Exam: Physical Exam  ROS  Blood pressure (!) 130/97, pulse 100, temperature 98.3 F (36.8 C), temperature source Oral, resp. rate 16, height 5\' 6"  (1.676 m), weight 86.6 kg, SpO2 97 %.Body mass index is 30.83 kg/m.  General Appearance: Fairly Groomed  Eye Contact:  Fair  Speech:  Clear and Coherent  Volume:  Increased  Mood:  Intervals of hypomania but generally contained and redirectable  Affect:  Congruent  Thought Process:  Irrelevant and Descriptions of Associations: Circumstantial  Orientation:  Full  (Time, Place, and Person)  Thought Content:  Illogical and Delusions  Suicidal Thoughts:  No  Homicidal Thoughts:  No  Memory:  Immediate;   Fair Recent;   Fair Remote;   Fair  Judgement:  Fair  Insight: Appropriate for some aspects of her illness but not regarding her delusions  Psychomotor Activity:  Normal  Concentration:  Concentration: Good and Attention Span: Fair  Recall:  [employer] of Knowledge:  Fair  Language:  Fair  Akathisia:  Negative  Handed:  Right  AIMS (if indicated):     Assets:  Communication Skills Leisure Time Physical Health  ADL's:  Intact  Cognition:  WNL  Sleep:  Number of Hours: 3.75     Treatment Plan Summary: Daily contact with patient to assess and evaluate symptoms and progress in treatment and Medication management  Continue antipsychotic and mood stabilizer therapy continue reality based therapy basic warnings and standard risk-benefit side effects discussed somewhat lost in the patient due to delusions Rosemary Mossbarger, MD 05/17/2019, 1:41 PM

## 2019-05-17 NOTE — Progress Notes (Signed)
Pt came out here room very emotional and place herself on the floor outside her room. Pt stated she did want to sleep in her room because she felt she would wake up her room with her crying. Writer felt it was best for client to sleep in the quiet room for the evening. Pt was made aware that if the quiet room was needed she would have to go back to her room.

## 2019-05-17 NOTE — Progress Notes (Signed)
Pt observed sitting on the floor outside her room, stating she can't sleep in the room with her roommate, pt wanting to sleep in the quiet room.

## 2019-05-17 NOTE — Progress Notes (Signed)
Recreation Therapy Notes  Date: 11.10.20 Time: 1000 Location: 500 Dayroom  Group Topic: Wellness  Goal Area(s) Addresses:  Patient will define components of whole wellness. Patient will verbalize benefit of whole wellness.  Behavioral Response: Engaged  Intervention: Music    Activity: Exercise.  LRT led group in a series of stretches.  Patients then led the group in an exercise of their choice.  Patients were encouraged to get water if needed and take breaks as needed.  Education: Wellness, Dentist.   Education Outcome: Acknowledges education/In group clarification offered/Needs additional education.   Clinical Observations/Feedback:  Pt was bright and active during group session.  Pt still made random comments that had nothing to do with group.  Pt was able to complete the exercises presented and stay on task with minimal redirection.    Victorino Sparrow, LRT/CTRS     Victorino Sparrow A 05/17/2019 11:27 AM

## 2019-05-17 NOTE — Progress Notes (Signed)
   05/17/19 2000  Psych Admission Type (Psych Patients Only)  Admission Status Voluntary  Psychosocial Assessment  Patient Complaints Anxiety;Irritability;Worrying;Suspiciousness  Eye Contact Fair  Facial Expression Animated  Affect Anxious  Training and development officer Assertive  Motor Activity Fidgety  Appearance/Hygiene Improved  Behavior Characteristics Anxious;Agitated;Irritable;Pacing;Fidgety  Mood Labile;Anxious;Preoccupied  Thought Process  Coherency Tangential;Disorganized  Content Delusions;Preoccupation  Delusions Referential  Perception Derealization  Hallucination None reported or observed  Judgment Poor  Confusion Mild  Danger to Self  Current suicidal ideation? Denies  Danger to Others  Danger to Others None reported or observed   Pt pacing the unit agitated since beginning of the shift. Pt presented with attention seeking behaviors. If someone asked for medications or wanted something, pt would say she needed the same thing. Pt very labile on the unit this evening. Pt upset and fussing one minute then the next she is laughing inappropriately.

## 2019-05-18 MED ORDER — CARVEDILOL 25 MG PO TABS
25.0000 mg | ORAL_TABLET | Freq: Two times a day (BID) | ORAL | Status: DC
  Administered 2019-05-18 – 2019-05-26 (×16): 25 mg via ORAL
  Filled 2019-05-18 (×12): qty 1
  Filled 2019-05-18: qty 2
  Filled 2019-05-18 (×8): qty 1

## 2019-05-18 MED ORDER — LORAZEPAM 1 MG PO TABS
4.0000 mg | ORAL_TABLET | Freq: Once | ORAL | Status: AC
  Administered 2019-05-18: 4 mg via ORAL
  Filled 2019-05-18: qty 4

## 2019-05-18 MED ORDER — HALOPERIDOL 5 MG PO TABS
10.0000 mg | ORAL_TABLET | Freq: Three times a day (TID) | ORAL | Status: DC
  Administered 2019-05-18 – 2019-05-20 (×7): 10 mg via ORAL
  Filled 2019-05-18 (×14): qty 2

## 2019-05-18 NOTE — Progress Notes (Signed)
The patient approached this author earlier in the evening and claimed that this Pryor Curia was her son-in-law. This Pryor Curia is not related to the patient. The patient approached this author once again and wanted to know why he didn't know her daughter.

## 2019-05-18 NOTE — Progress Notes (Signed)
Pt agitated this morning , given morning medications early

## 2019-05-18 NOTE — Progress Notes (Signed)
Recreation Therapy Notes  Date: 11.11.20 Time: 1000 Location: 500 Hall Dayroom  Group Topic: Triggers  Goal Area(s) Addresses:  Patient will identify their biggest triggers. Patient will identify how to deal with triggers head on. Patient will identify how to avoid triggers.  Behavioral Response: Engaged  Intervention: Worksheet  Activity: Triggers.  Patients were to identify their three biggest triggers, how they avoid those triggers and how they deal triggers head on when they can't be avoided.  Education: Communication, Discharge Planning  Education Outcome: Acknowledges understanding/In group clarification offered/Needs additional education.   Clinical Observations/Feedback: Pt was a hyper and focused on leaving.  Pt was able to be redirected and completed her sheet.  Pt identified her triggers as stress, drunks and fighting.  Pt avoids triggers as moving away and staying away from drama.  Pt deals with triggers head on by staying away for good, move to [place] and go to daughter's wedding.      Victorino Sparrow, LRT/CTRS     Victorino Sparrow A 05/18/2019 11:21 AM

## 2019-05-18 NOTE — Progress Notes (Signed)
[facility] MD Progress Note  05/18/2019 10:20 AM Allison Bolton  MRN:  [id number] Subjective:    Patient expresses numerous delusional believes she also is self agitating demanding to leave to go to court is even called her companion to come pick her up so no discharge is planned.  I spoke with him on the phone with her permission.  Patient is intrusive pressured rambling manic and without reality basis she may indeed have court today but she is not capable of participating meaningfully at this point in time.  No involuntary movements no EPS or TD  Principal Problem: Relapse regarding psychotic disorder Diagnosis: Active Problems:   Delusional disorder ([place])   Bipolar I disorder, current or most recent episode manic, with psychotic features ([place])  Total Time spent with patient: 20 minutes  Past Psychiatric History: extensive   Past Medical History:  Past Medical History:  Diagnosis Date  . Arthritis   . Asthma   . Bipolar 1 disorder ([place])   . Diverticulosis   . Dysrhythmia    sts "I have heart palpitations"  . Fibromyalgia   . H/O hiatal hernia    4  . Headache(784.0)   . Meniere's disease   . S/P colonoscopy    Dr. Laural Golden 2010: few small diverticula at sigmoid. otherwise normal.   . Shortness of breath     Past Surgical History:  Procedure Laterality Date  . ABDOMINAL HYSTERECTOMY    . APPENDECTOMY    . CESAREAN SECTION    . CHOLECYSTECTOMY    . exploratory laparoscopy    . FOOT SURGERY    . HEMORRHOID SURGERY    . LAPAROSCOPIC APPENDECTOMY  02/19/2011   Procedure: APPENDECTOMY LAPAROSCOPIC;  Surgeon: Jamesetta So;  Location: [facility];  Service: General;  Laterality: N/A;  . LAPAROSCOPY  02/19/2011   Procedure: LAPAROSCOPY DIAGNOSTIC;  Surgeon: Jamesetta So;  Location: [facility];  Service: General;  Laterality: N/A;  . left ovarian removal    . multiple hernia repairs    . Right ovarian removal    . TONSILLECTOMY    . TONSILLECTOMY AND ADENOIDECTOMY    . tubes in  ears    . UMBILICAL HERNIA REPAIR  Dec 2011   Dr. Arnoldo Morale  . wisdom teeth removal     Family History:  Family History  Problem Relation Age of Onset  . Thyroid disease Mother   . Colon cancer Other        paternal and maternal grandfather  . Meniere's disease Other   . Colon cancer Father   . Colon cancer Brother   . Anesthesia problems Neg Hx   . Hypotension Neg Hx   . Malignant hyperthermia Neg Hx   . Pseudochol deficiency Neg Hx    Family Psychiatric  History: Patient denies companion denies Social History:  Social History   Substance and Sexual Activity  Alcohol Use No   Comment: not since June 2012     Social History   Substance and Sexual Activity  Drug Use No   Comment: patient denies any-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    Social History   Socioeconomic History  . Marital status: Legally Separated    Spouse name: Not on file  . Number of children: Not on file  . Years of education: Not on file  . Highest education level: Not on file  Occupational History  . Not on file  Social Needs  . Financial resource strain: Not on file  .  Food insecurity    Worry: Not on file    Inability: Not on file  . Transportation needs    Medical: Not on file    Non-medical: Not on file  Tobacco Use  . Smoking status: Former Smoker    Packs/day: 0.50    Years: 20.00    Pack years: 10.00    Types: Cigarettes    Start date: 07/07/1981    Quit date: 11/05/2018    Years since quitting: 0.5  . Smokeless tobacco: Never Used  Substance and Sexual Activity  . Alcohol use: No    Comment: not since June 2012  . 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 Sexual activity: Never    Birth control/protection: Surgical  Lifestyle  . Physical activity    Days per week: Not on file    Minutes per session: Not on file  . Stress: Not on file  Relationships  . Social Herbalist on phone: Not on file    Gets together:  Not on file    Attends religious service: Not on file    Active member of club or organization: Not on file    Attends meetings of clubs or organizations: Not on file    Relationship status: Not on file  Other Topics Concern  . Not on file  Social History Narrative  . Not on file   Sleep: Good  Appetite:  Good  Current Medications: Current Facility-Administered Medications  Medication Dose Route Frequency Provider Last Rate Last Dose  . acetaminophen (TYLENOL) tablet 650 mg  650 mg Oral Q6H PRN Deloria Lair, NP   650 mg at 05/15/19 2200  . alum & mag hydroxide-simeth (MAALOX/MYLANTA) 200-200-20 MG/5ML suspension 30 mL  30 mL Oral Q4H PRN Lindell Spar I, NP   30 mL at 05/13/19 2317  . benztropine (COGENTIN) tablet 1 mg  1 mg Oral BID Johnn Hai, MD   1 mg at 05/18/19 0701  . carvedilol (COREG) tablet 25 mg  25 mg Oral BID WC Johnn Hai, MD      . divalproex (DEPAKOTE) DR tablet 750 mg  750 mg Oral QPM Sharma Covert, MD   750 mg at 05/17/19 1717  . gabapentin (NEURONTIN) capsule 400 mg  400 mg Oral TID Sharma Covert, MD   400 mg at 05/18/19 0701  . haloperidol (HALDOL) tablet 10 mg  10 mg Oral TID Johnn Hai, MD      . LORazepam (ATIVAN) tablet 2 mg  2 mg Oral Q4H PRN Johnn Hai, MD   2 mg at 05/18/19 0148   Or  . LORazepam (ATIVAN) injection 2 mg  2 mg Intramuscular Q4H PRN Johnn Hai, MD      . LORazepam (ATIVAN) tablet 4 mg  4 mg Oral Once Johnn Hai, MD      . magnesium hydroxide (MILK OF MAGNESIA) suspension 30 mL  30 mL Oral Daily PRN Anike, Adaku C, NP   30 mL at 05/12/19 0526  . naproxen (NAPROSYN) tablet 500 mg  500 mg Oral BID Mordecai Maes, NP   500 mg at 05/18/19 [id number]  . pantoprazole (PROTONIX) EC tablet 40 mg  40 mg Oral Daily Mordecai Maes, NP   40 mg at 05/18/19 [id number]  . QUEtiapine (SEROQUEL) tablet 400 mg  400 mg Oral QHS Sharma Covert, MD   400 mg at 05/17/19 2100  . QUEtiapine (SEROQUEL)  tablet 50 mg  50 mg Oral Q4H PRN Johnn Hai,  MD      . traZODone (DESYREL) tablet 200 mg  200 mg Oral QHS Johnn Hai, MD   200 mg at 05/17/19 2100  . zolpidem (AMBIEN) tablet 10 mg  10 mg Oral QHS PRN Sharma Covert, MD   10 mg at 05/16/19 2255    Lab Results: No results found for this or any previous visit (from the past 17 hour(s)).  Blood Alcohol level:  Lab Results  Component Value Date   ETH <10 05/07/2019   ETH <10 [id number]    Metabolic Disorder Labs: Lab Results  Component Value Date   HGBA1C 5.3 05/11/2019   MPG 105.41 05/11/2019   MPG 114 05/13/2013   Lab Results  Component Value Date   PROLACTIN 24.1 (H) 05/11/2019   Lab Results  Component Value Date   CHOL 139 05/11/2019   TRIG 66 05/11/2019   HDL 44 05/11/2019   CHOLHDL 3.2 05/11/2019   VLDL 13 05/11/2019   LDLCALC 82 05/11/2019   LDLCALC 88 05/13/2013    Physical Findings: AIMS: Facial and Oral Movements Muscles of Facial Expression: None, normal Lips and Perioral Area: None, normal Jaw: None, normal Tongue: None, normal,Extremity Movements Upper (arms, wrists, hands, fingers): None, normal Lower (legs, knees, ankles, toes): None, normal, Trunk Movements Neck, shoulders, hips: None, normal, Overall Severity Severity of abnormal movements (highest score from questions above): None, normal Incapacitation due to abnormal movements: None, normal Patient's awareness of abnormal movements (rate only patient's report): No Awareness, Dental Status Current problems with teeth and/or dentures?: No Does patient usually wear dentures?: No  CIWA:  CIWA-Ar Total: 1 COWS:  COWS Total Score: 1  Musculoskeletal: Strength & Muscle Tone: within normal limits Gait & Station: normal Patient leans: N/A  Psychiatric Specialty Exam: Physical Exam  ROS  Blood pressure (!) 161/147, pulse (!) 128, temperature 97.8 F (36.6 C), temperature source Oral, resp. rate 16, height 5\' 6"  (1.676 m), weight 86.6 kg, SpO2 97 %.Body mass index is 30.83 kg/m.   General Appearance: Disheveled  Eye Contact:  Minimal  Speech:  Pressured  Volume:  Increased  Mood:  labile- manic  Affect:  Labile  Thought Process:  Disorganized and Descriptions of Associations: Loose  Orientation:  Other:  person- place- situation  Thought Content:  Illogical and Delusions  Suicidal Thoughts:  No  Homicidal Thoughts:  No  Memory:  Immediate;   Poor Recent;   Poor Remote;   Poor  Judgement:  Impaired  Insight:  Lacking  Psychomotor Activity:  Increased  Concentration:  Concentration: Fair and Attention Span: Poor  Recall:  Poor  Fund of Knowledge:  Poor  Language:  rambles  Akathisia:  Negative  Handed:  Right  AIMS (if indicated):     Assets:  Communication Skills Physical Health Resilience  ADL's:  Intact  Cognition:  WNL  Sleep:  Number of Hours: 7     Treatment Plan Summary: Daily contact with patient to assess and evaluate symptoms and progress in treatment and Medication management  Continue reality based therapy needs frequent redirection use acute meds to calm her and escalate haloperidol as mentioned.  No change in precautions basic warnings given  Johnn Hai, MD 05/18/2019, 10:20 AM

## 2019-05-18 NOTE — Progress Notes (Signed)
Pt up wanting to call different people in the night. Pt yelling and pulling call bell stating staff better not let her miss the plane. Pt continues to confabulate information to her stories.

## 2019-05-19 MED ORDER — QUETIAPINE FUMARATE 100 MG PO TABS
100.0000 mg | ORAL_TABLET | Freq: Two times a day (BID) | ORAL | Status: DC
  Administered 2019-05-19 – 2019-05-23 (×9): 100 mg via ORAL
  Filled 2019-05-19 (×11): qty 1

## 2019-05-19 NOTE — Progress Notes (Signed)
[facility] MD Progress Note  05/19/2019 1:03 PM Allison Bolton  MRN:  [id number] Subjective:    Remains manic, intrusive and pressured stating "you have to treat my daughter for burns" and points to another patient again very delusional and she is still not responding to combination of antipsychotic/mood stabilizer therapy as far as a fuller response yet  No EPS or TD.  she is redirectable.  Still self agitating   Principal Problem: <principal problem not specified> Diagnosis: Active Problems:   Delusional disorder ([facility])   Bipolar I disorder, current or most recent episode manic, with psychotic features ([place])  Total Time spent with patient: 20 minutes    Past Medical History:  Past Medical History:  Diagnosis Date  . Arthritis   . Asthma   . Bipolar 1 disorder ([place])   . Diverticulosis   . Dysrhythmia    sts "I have heart palpitations"  . Fibromyalgia   . H/O hiatal hernia    4  . Headache(784.0)   . Meniere's disease   . S/P colonoscopy    Dr. Laural Golden 2010: few small diverticula at sigmoid. otherwise normal.   . Shortness of breath     Past Surgical History:  Procedure Laterality Date  . ABDOMINAL HYSTERECTOMY    . APPENDECTOMY    . CESAREAN SECTION    . CHOLECYSTECTOMY    . exploratory laparoscopy    . FOOT SURGERY    . HEMORRHOID SURGERY    . LAPAROSCOPIC APPENDECTOMY  02/19/2011   Procedure: APPENDECTOMY LAPAROSCOPIC;  Surgeon: Jamesetta So;  Location: [facility];  Service: General;  Laterality: N/A;  . LAPAROSCOPY  02/19/2011   Procedure: LAPAROSCOPY DIAGNOSTIC;  Surgeon: Jamesetta So;  Location: [facility];  Service: General;  Laterality: N/A;  . left ovarian removal    . multiple hernia repairs    . Right ovarian removal    . TONSILLECTOMY    . TONSILLECTOMY AND ADENOIDECTOMY    . tubes in ears    . UMBILICAL HERNIA REPAIR  Dec 2011   Dr. Arnoldo Morale  . wisdom teeth removal     Family History:  Family History  Problem Relation Age of Onset  . Thyroid disease  Mother   . Colon cancer Other        paternal and maternal grandfather  . Meniere's disease Other   . Colon cancer Father   . Colon cancer Brother   . Anesthesia problems Neg Hx   . Hypotension Neg Hx   . Malignant hyperthermia Neg Hx   . 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 Marital status: Legally Separated    Spouse name: Not on file  . Number of children: Not on file  . Years of education: Not on file  . Highest education level: Not on file  Occupational History  . Not on file  Social Needs  . Financial resource strain: Not on file  . Food insecurity    Worry: Not on file    Inability: Not on file  . Transportation needs    Medical: Not on file    Non-medical: Not on file  Tobacco Use  . Smoking status: Former Smoker    Packs/day: 0.50    Years: 20.00    Pack years: 10.00    Types: Cigarettes    Start date: 07/07/1981    Quit date: 11/05/2018    Years since quitting: 0.5  . Smokeless tobacco: Never Used  Substance and Sexual Activity  . Alcohol use: No    Comment: not since June 2012  . 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 Sexual activity: Never    Birth control/protection: Surgical  Lifestyle  . Physical activity    Days per week: Not on file    Minutes per session: Not on file  . Stress: Not on file  Relationships  . Social Herbalist on phone: Not on file    Gets together: Not on file    Attends religious service: Not on file    Active member of club or organization: Not on file    Attends meetings of clubs or organizations: Not on file    Relationship status: Not on file  Other Topics Concern  .  Not on file  Social History Narrative  . Not on file   Additional Social History:                         Sleep: Fair  Appetite:  Fair  Current Medications: Current Facility-Administered Medications  Medication Dose Route Frequency Provider Last Rate Last Dose  . acetaminophen (TYLENOL) tablet 650 mg  650 mg Oral Q6H PRN Deloria Lair, NP   650 mg at 05/15/19 2200  . alum & mag hydroxide-simeth (MAALOX/MYLANTA) 200-200-20 MG/5ML suspension 30 mL  30 mL Oral Q4H PRN Lindell Spar I, NP   30 mL at 05/13/19 2317  . benztropine (COGENTIN) tablet 1 mg  1 mg Oral BID Johnn Hai, MD   1 mg at 05/19/19 0756  . carvedilol (COREG) tablet 25 mg  25 mg Oral BID WC Johnn Hai, MD   25 mg at 05/19/19 0756  . divalproex (DEPAKOTE) DR tablet 750 mg  750 mg Oral QPM Sharma Covert, MD   750 mg at 05/18/19 1742  . gabapentin (NEURONTIN) capsule 400 mg  400 mg Oral TID Sharma Covert, MD   400 mg at 05/19/19 1303  . haloperidol (HALDOL) tablet 10 mg  10 mg Oral TID Johnn Hai, MD   10 mg at 05/19/19 1303  . LORazepam (ATIVAN) tablet 2 mg  2 mg Oral Q4H PRN Johnn Hai, MD   2 mg at 05/18/19 0148   Or  . LORazepam (ATIVAN) injection 2 mg  2 mg Intramuscular Q4H PRN Johnn Hai, MD      . magnesium hydroxide (MILK OF MAGNESIA) suspension 30 mL  30 mL Oral Daily PRN Anike, Adaku C, NP   30 mL at 05/12/19 0526  . naproxen (NAPROSYN) tablet 500 mg  500 mg Oral BID Mordecai Maes, NP   500 mg at 05/19/19 0756  . pantoprazole (PROTONIX) EC tablet 40 mg  40 mg Oral Daily Mordecai Maes, NP   40 mg at 05/19/19 0756  . QUEtiapine (SEROQUEL) tablet 100 mg  100 mg Oral BID Johnn Hai, MD   100 mg at 05/19/19 1303  . QUEtiapine (SEROQUEL) tablet 400 mg  400 mg Oral QHS Sharma Covert, MD   400 mg at 05/18/19 2111  . QUEtiapine (  SEROQUEL) tablet 50 mg  50 mg Oral Q4H PRN Johnn Hai, MD      . traZODone (DESYREL) tablet 200 mg  200 mg Oral QHS Johnn Hai, MD   200 mg at 05/18/19  2111  . zolpidem (AMBIEN) tablet 10 mg  10 mg Oral QHS PRN Sharma Covert, MD   10 mg at 05/16/19 2255    Lab Results: No results found for this or any previous visit (from the past 65 hour(s)).  Blood Alcohol level:  Lab Results  Component Value Date   ETH <10 05/07/2019   ETH <10 [id number]    Metabolic Disorder Labs: Lab Results  Component Value Date   HGBA1C 5.3 05/11/2019   MPG 105.41 05/11/2019   MPG 114 05/13/2013   Lab Results  Component Value Date   PROLACTIN 24.1 (H) 05/11/2019   Lab Results  Component Value Date   CHOL 139 05/11/2019   TRIG 66 05/11/2019   HDL 44 05/11/2019   CHOLHDL 3.2 05/11/2019   VLDL 13 05/11/2019   LDLCALC 82 05/11/2019   LDLCALC 88 05/13/2013    Physical Findings: AIMS: Facial and Oral Movements Muscles of Facial Expression: None, normal Lips and Perioral Area: None, normal Jaw: None, normal Tongue: None, normal,Extremity Movements Upper (arms, wrists, hands, fingers): None, normal Lower (legs, knees, ankles, toes): None, normal, Trunk Movements Neck, shoulders, hips: None, normal, Overall Severity Severity of abnormal movements (highest score from questions above): None, normal Incapacitation due to abnormal movements: None, normal Patient's awareness of abnormal movements (rate only patient's report): No Awareness, Dental Status Current problems with teeth and/or dentures?: No Does patient usually wear dentures?: No  CIWA:  CIWA-Ar Total: 1 COWS:  COWS Total Score: 1  Musculoskeletal: Strength & Muscle Tone: within normal limits Gait & Station: normal Patient leans: N/A  Psychiatric Specialty Exam: Physical Exam  ROS  Blood pressure 116/78, pulse 85, temperature 98 F (36.7 C), temperature source Oral, resp. rate 16, height 5\' 6"  (1.676 m), weight 86.6 kg, SpO2 97 %.Body mass index is 30.83 kg/m.  General Appearance: Casual  Eye Contact:  Good  Speech:  Clear and Coherent and Pressured  Volume:  Increased   Mood:  Still manic and thought and behavior  Affect:  Congruent  Thought Process:  Irrelevant and Descriptions of Associations: Loose  Orientation:  Full (Time, Place, and Person)  Thought Content:  Illogical and Delusions  Suicidal Thoughts:  No  Homicidal Thoughts:  no  Memory:  Immediate;   Fair Recent;   Fair Remote;   Fair  Judgement:  Fair  Insight:  Fair  Psychomotor Activity:  Normal  Concentration:  Concentration: Fair and Attention Span: Fair  Recall:  [employer] of Knowledge:  Fair  Language:  Fair  Akathisia:  Negative  Handed:  Right  AIMS (if indicated):     Assets:  Physical Health Resilience  ADL's:  Intact  Cognition:  WNL  Sleep:  Number of Hours: 5.5     Treatment Plan Summary: Daily contact with patient to assess and evaluate symptoms and progress in treatment and Medication management  Continue cognitive and reality-based therapies no change in meds or precautions probable monitoring through the weekend  [facility], MD 05/19/2019, 1:03 PM

## 2019-05-19 NOTE — Progress Notes (Signed)
Recreation Therapy Notes  Date: 11.12.20 Time: 1000 Location: 500 [address] Dayroom   Group Topic: Leisure Education  Goal Area(s) Addresses:  Patient will identify positive leisure activities.  Patient will identify one positive benefit of participation in leisure activities.   Behavioral Response: Engaged  Intervention: Leisure Group Game  Activity: Midwife.  Patients were spread out in the dayroom.  Patients were timed as they tossed the ball back and forth to each other to see how long they could keep the ball moving. The ball could be bounced off the floor or tossed.  At no time could the ball come to a complete stop.  If the ball stopped moving, the time would be reset.  Education:  Leisure Education, Dentist  Education Outcome: Acknowledges education/In group clarification offered/Needs additional education  Clinical Observations/Feedback: Pt was active and engaged with peers throughout activity.  Pt was also social with peers during group.  Pt left early stating she needed a break and was hot.     Victorino Sparrow, LRT/CTRS     Victorino Sparrow A 05/19/2019 11:36 AM

## 2019-05-19 NOTE — Progress Notes (Signed)
Patient did not have a positive event for the day and was angry. She blurted out that she has cancer and has abdominal pain. She spoke on a tangent and interrupted her peers in group.

## 2019-05-19 NOTE — Progress Notes (Signed)
Pt present with anxious affect and disorganized mood in the the unit. Pt been asking for baby wipe, pt stated her cousin owns [employer]. Pt woke up in the middle of night, set the alarm on stating that she wants her mom, her dad and the rest of her family members. Pt also complained of being hungry. Pt remain safe in the unit with q15 min check, will continue to monitor.

## 2019-05-19 NOTE — [facility] Group Notes (Signed)
LCSW Group Therapy Note  Date and Time: 05/19/2019 @ 1:30pm  Type of Therapy and Topic: Group Therapy: Feelings Around Returning Home & Establishing a Supportive Framework and Supporting Oneself When Supports Not Available  Participation Level: [facility] PARTICIPATION LEVEL: Minimal  Mood: Manic    Description of Group:  Patients first processed thoughts and feelings about upcoming discharge. These included fears of upcoming changes, lack of change, new living environments, judgements and expectations from others and overall stigma of mental health issues. The group then discussed the definition of a supportive framework, what that looks and feels like, and how do to discern it from an unhealthy non-supportive network. The group identified different types of supports as well as what to do when your family/friends are less than helpful or unavailable     Therapeutic Goals   1.  Patient will identify one healthy supportive network that they can use at discharge.  2.  Patient will identify one factor of a supportive framework and how to tell it from an unhealthy network.  3.  Patient able to identify one coping skill to use when they do not have positive supports from others.  4.  Patient will demonstrate ability to communicate their needs through discussion and/or role plays.     Summary of Patient Progress:      Patient was in group therapy today. Patient was able to discuss that her uncle is her healthy supportive network and he helps her mentally. Patient stated that she knows that quitting/never doing drugs again so that she will not have to come back to the hospital. Patient came in and out of group multiple times.      Therapeutic Modalities  Cognitive Behavioral Therapy  Motivational Interviewing   Ardelle Anton, MSW, [place]

## 2019-05-20 ENCOUNTER — Encounter (HOSPITAL_COMMUNITY): Payer: Self-pay | Admitting: Registered Nurse

## 2019-05-20 DIAGNOSIS — R7989 Other specified abnormal findings of blood chemistry: Secondary | ICD-10-CM

## 2019-05-20 MED ORDER — HALOPERIDOL 5 MG PO TABS
5.0000 mg | ORAL_TABLET | Freq: Three times a day (TID) | ORAL | Status: DC
  Administered 2019-05-20 – 2019-05-23 (×8): 5 mg via ORAL
  Filled 2019-05-20 (×12): qty 1

## 2019-05-20 MED ORDER — GABAPENTIN 300 MG PO CAPS
300.0000 mg | ORAL_CAPSULE | Freq: Three times a day (TID) | ORAL | Status: DC
  Administered 2019-05-20 – 2019-05-26 (×17): 300 mg via ORAL
  Filled 2019-05-20 (×23): qty 1
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
## 2019-05-20 NOTE — Progress Notes (Signed)
Adult Psychoeducational Group Note  Date:  05/20/2019 Time:  9:43 PM  Group Topic/Focus:  Wrap-Up Group:   The focus of this group is to help patients review their daily goal of treatment and discuss progress on daily workbooks.  Participation Level:  Minimal  Participation Quality:  Appropriate  Affect:  Appropriate  Cognitive:  Oriented  Insight: Limited  Engagement in Group:  Engaged  Modes of Intervention:  Education and Support  Additional Comments:  Patient attended and participated in group tonight. She reports having a good day  Debe Coder 05/20/2019, 9:43 PM

## 2019-05-20 NOTE — Progress Notes (Signed)
Pt complained of growth in her stomach and has been requesting for surgery. Pt is very somatic and look more confused. Pt took all her meds without any problems, kept waking up asking to take her morning meds. Maintained on q 15 min checks, will continue to monitor.

## 2019-05-20 NOTE — [facility] Group Notes (Signed)
[place] LCSW Group Therapy Note  Date/Time: 11/13 at 11:30  Type of Therapy/Topic:  Group Therapy:  Feelings about Diagnosis  Participation Level:  Active   Mood: Pleasant    Description of Group:    This group will allow patients to explore their thoughts and feelings about diagnoses they have received. Patients will be guided to explore their level of understanding and acceptance of these diagnoses. Facilitator will encourage patients to process their thoughts and feelings about the reactions of others to their diagnosis, and will guide patients in identifying ways to discuss their diagnosis with significant others in their lives. This group will be process-oriented, with patients participating in exploration of their own experiences as well as giving and receiving support and challenge from other group members.   Therapeutic Goals: 1. Patient will demonstrate understanding of diagnosis as evidence by identifying two or more symptoms of the disorder:  2. Patient will be able to express two feelings regarding the diagnosis 3. Patient will demonstrate ability to communicate their needs through discussion and/or role plays  Summary of Patient Progress:  Pt was able to identify the stigmas around mental health and how they affect her personally.       Therapeutic Modalities:   Cognitive Behavioral Therapy Brief Therapy Feelings Identification   Ovidio Kin, MSW intern

## 2019-05-20 NOTE — Progress Notes (Signed)
   05/20/19 1950  Psych Admission Type (Psych Patients Only)  Admission Status Voluntary  Psychosocial Assessment  Patient Complaints Anxiety;Worrying  Eye Contact Fair  Facial Expression Animated  Affect Anxious  Training and development officer Assertive  Motor Activity Fidgety  Appearance/Hygiene Improved  Behavior Characteristics Anxious;Fidgety;Restless  Mood Anxious;Preoccupied  Thought Process  Coherency Tangential;Disorganized  Content Delusions;Preoccupation  Delusions Referential  Perception Derealization  Hallucination None reported or observed  Judgment Poor  Confusion Mild  Danger to Self  Current suicidal ideation? Denies  Danger to Others  Danger to Others None reported or observed

## 2019-05-20 NOTE — Progress Notes (Signed)
Spirituality group facilitated by Simone Curia, MDiv, BCC.  Group Description:  Group focused on topic of hope.  Patients participated in facilitated discussion around topic, connecting with one another around experiences and definitions for hope.  Group members engaged with visual explorer photos, reflecting on what hope looks like for them today.  Group engaged in discussion around how their definitions of hope are present today in hospital.   Modalities: Psycho-social ed, Adlerian, Narrative, MI Patient Progress: Allison Bolton was present through 48 of group.  Left group twice, but returned.  Requested several times to have her blood drawn so she could leave [facility].  Also ruminated about familial connections with other group members.  Otherwise oriented to conversation and became particularly oriented speaking about cooking and food.

## 2019-05-20 NOTE — Progress Notes (Signed)
Patient denies SI, HI and AVH. Patient has been compliant with medications and engaged in unit activities. Patient has had no incidents of behavioral dyscontrol.   Assess patient for safety, offer medications as prescribed, engage patient in 1:1 staff talks.   Patient able to contract for safety. Continue to monitor as planned.

## 2019-05-20 NOTE — Tx Team (Addendum)
Interdisciplinary Treatment and Diagnostic Plan Update  05/20/2019 Time of Session: 9:05am Allison Bolton MRN: [id number]  Principal Diagnosis: <principal problem not specified>  Secondary Diagnoses: Active Problems:   Delusional disorder ([place])   Bipolar I disorder, current or most recent episode manic, with psychotic features ([place])   Current Medications:  Current Facility-Administered Medications  Medication Dose Route Frequency Provider Last Rate Last Dose  . acetaminophen (TYLENOL) tablet 650 mg  650 mg Oral Q6H PRN Deloria Lair, NP   650 mg at 05/19/19 1954  . alum & mag hydroxide-simeth (MAALOX/MYLANTA) 200-200-20 MG/5ML suspension 30 mL  30 mL Oral Q4H PRN Lindell Spar I, NP   30 mL at 05/13/19 2317  . benztropine (COGENTIN) tablet 1 mg  1 mg Oral BID Johnn Hai, MD   1 mg at 05/20/19 0751  . carvedilol (COREG) tablet 25 mg  25 mg Oral BID WC Johnn Hai, MD   25 mg at 05/20/19 0751  . divalproex (DEPAKOTE) DR tablet 750 mg  750 mg Oral QPM Sharma Covert, MD   750 mg at 05/19/19 1741  . gabapentin (NEURONTIN) capsule 400 mg  400 mg Oral TID Sharma Covert, MD   400 mg at 05/20/19 [id number]  . haloperidol (HALDOL) tablet 10 mg  10 mg Oral TID Johnn Hai, MD   10 mg at 05/20/19 0752  . LORazepam (ATIVAN) tablet 2 mg  2 mg Oral Q4H PRN Johnn Hai, MD   2 mg at 05/19/19 2111   Or  . LORazepam (ATIVAN) injection 2 mg  2 mg Intramuscular Q4H PRN Johnn Hai, MD      . magnesium hydroxide (MILK OF MAGNESIA) suspension 30 mL  30 mL Oral Daily PRN Anike, Adaku C, NP   30 mL at 05/12/19 0526  . naproxen (NAPROSYN) tablet 500 mg  500 mg Oral BID Mordecai Maes, NP   500 mg at 05/20/19 0752  . pantoprazole (PROTONIX) EC tablet 40 mg  40 mg Oral Daily Mordecai Maes, NP   40 mg at 05/20/19 0752  . QUEtiapine (SEROQUEL) tablet 100 mg  100 mg Oral BID Johnn Hai, MD   100 mg at 05/20/19 0752  . QUEtiapine (SEROQUEL) tablet 400 mg  400 mg Oral QHS Sharma Covert, MD    400 mg at 05/19/19 2111  . QUEtiapine (SEROQUEL) tablet 50 mg  50 mg Oral Q4H PRN Johnn Hai, MD      . traZODone (DESYREL) tablet 200 mg  200 mg Oral QHS Johnn Hai, MD   200 mg at 05/19/19 2111  . zolpidem (AMBIEN) tablet 10 mg  10 mg Oral QHS PRN Sharma Covert, MD   10 mg at 05/16/19 2255   PTA Medications: Medications Prior to Admission  Medication Sig Dispense Refill Last Dose  . acetaminophen (TYLENOL) 325 MG tablet Take 650 mg by mouth every 6 (six) hours as needed.     . gabapentin (NEURONTIN) 300 MG capsule TAKE 1 CAPSULE BY MOUTH THREE TIMES A DAY     . lamoTRIgine (LAMICTAL) 200 MG tablet Take 200 mg by mouth daily.     Marland Kitchen LATUDA 80 MG TABS tablet Take 0.5 tablets by mouth 2 (two) times daily.     . naproxen (NAPROSYN) 500 MG tablet Take 1 tablet (500 mg total) by mouth 2 (two) times daily. 30 tablet 0   . omeprazole (PRILOSEC) 20 MG capsule Take 20 mg by mouth daily.     . QUEtiapine (SEROQUEL) 200 MG tablet  Take 350 mg by mouth at bedtime.      . topiramate (TOPAMAX) 25 MG tablet Take 25 mg by mouth daily.     . traZODone (DESYREL) 100 MG tablet Take 100 mg by mouth at bedtime.       Patient Stressors: Marital or family conflict Medication change or noncompliance  Patient Strengths: Technical sales engineer for treatment/growth  Treatment Modalities: Medication Management, Group therapy, Case management,  1 to 1 session with clinician, Psychoeducation, Recreational therapy.   Physician Treatment Plan for Primary Diagnosis: <principal problem not specified> Long Term Goal(s): Improvement in symptoms so as ready for discharge Improvement in symptoms so as ready for discharge   Short Term Goals: Ability to disclose and discuss suicidal ideas Ability to demonstrate self-control will improve Ability to identify and develop effective coping behaviors will improve Ability to maintain clinical measurements within normal limits will improve Compliance  with prescribed medications will improve Ability to disclose and discuss suicidal ideas Ability to demonstrate self-control will improve Ability to identify and develop effective coping behaviors will improve Ability to maintain clinical measurements within normal limits will improve Compliance with prescribed medications will improve  Medication Management: Evaluate patient's response, side effects, and tolerance of medication regimen.  Therapeutic Interventions: 1 to 1 sessions, Unit Group sessions and Medication administration.  Evaluation of Outcomes: Progressing  Physician Treatment Plan for Secondary Diagnosis: Active Problems:   Delusional disorder ([place])   Bipolar I disorder, current or most recent episode manic, with psychotic features ([place])  Long Term Goal(s): Improvement in symptoms so as ready for discharge Improvement in symptoms so as ready for discharge   Short Term Goals: Ability to disclose and discuss suicidal ideas Ability to demonstrate self-control will improve Ability to identify and develop effective coping behaviors will improve Ability to maintain clinical measurements within normal limits will improve Compliance with prescribed medications will improve Ability to disclose and discuss suicidal ideas Ability to demonstrate self-control will improve Ability to identify and develop effective coping behaviors will improve Ability to maintain clinical measurements within normal limits will improve Compliance with prescribed medications will improve     Medication Management: Evaluate patient's response, side effects, and tolerance of medication regimen.  Therapeutic Interventions: 1 to 1 sessions, Unit Group sessions and Medication administration.  Evaluation of Outcomes: Progressing   RN Treatment Plan for Primary Diagnosis: <principal problem not specified> Long Term Goal(s): Knowledge of disease and therapeutic regimen to maintain health will  improve  Short Term Goals: Ability to participate in decision making will improve, Ability to verbalize feelings will improve, Ability to disclose and discuss suicidal ideas, Ability to identify and develop effective coping behaviors will improve and Compliance with prescribed medications will improve  Medication Management: RN will administer medications as ordered by provider, will assess and evaluate patient's response and provide education to patient for prescribed medication. RN will report any adverse and/or side effects to prescribing provider.  Therapeutic Interventions: 1 on 1 counseling sessions, Psychoeducation, Medication administration, Evaluate responses to treatment, Monitor vital signs and CBGs as ordered, Perform/monitor CIWA, COWS, AIMS and Fall Risk screenings as ordered, Perform wound care treatments as ordered.  Evaluation of Outcomes: Progressing   LCSW Treatment Plan for Primary Diagnosis: <principal problem not specified> Long Term Goal(s): Safe transition to appropriate next level of care at discharge, Engage patient in therapeutic group addressing interpersonal concerns.  Short Term Goals: Engage patient in aftercare planning with referrals and resources and Increase skills for wellness and recovery  Therapeutic Interventions: Assess for all discharge needs, 1 to 1 time with Education officer, museum, Explore available resources and support systems, Assess for adequacy in community support network, Educate family and significant other(s) on suicide prevention, Complete Psychosocial Assessment, Interpersonal group therapy.  Evaluation of Outcomes: Progressing   Progress in Treatment: Attending groups: Yes. Participating in groups: Yes. Taking medication as prescribed: Yes. Toleration medication: Yes. Family/Significant other contact made: Yes, individual(s) contacted:  pt's friend that she is living with Patient understands diagnosis: No. Discussing patient identified  problems/goals with staff: Yes. Medical problems stabilized or resolved: Yes. Denies suicidal/homicidal ideation: Yes. Issues/concerns per patient self-inventory: No. Other:   New problem(s) identified: No, Describe:  None  New Short Term/Long Term Goal(s): Medication stabilization, elimination of SI thoughts, and development of a comprehensive mental wellness plan.   Patient Goals:  Patient could not identify a goal  Discharge Plan or Barriers: Patient will follow up with her PCP doctor for medication management and therapy through [facility] in [place].   Reason for Continuation of Hospitalization: Delusions  Medication stabilization  Estimated Length of Stay: 2-3 days  Attendees: Patient: 05/20/2019   Physician: Dr. Neita Garnet, MD Dr.Farah 05/20/2019  Nursing: Vladimir Faster, RN 05/20/2019  RN Care Manager: 05/20/2019   Social Worker: Ardelle Anton, [place], [place] 05/20/2019   Recreational Therapist:  05/20/2019   Other:  05/20/2019   Other:  05/20/2019   Other: 05/20/2019      Scribe for Treatment Team: Trecia Rogers, LCSW 05/20/2019 9:58 AM

## 2019-05-20 NOTE — Progress Notes (Signed)
Recreation Therapy Notes  Date: 11.13.20 Time: 1000 Location: 500 [address] Dayroom  Group Topic: Self-Esteem  Goal Area(s) Addresses:  Patient will successfully identify positive attributes about themselves.  Patient will successfully identify benefit of improved self-esteem.   Behavioral Response: Engaged  Intervention: Blank crest  Activity: Crest of Arms.  Patients were given a crest divided into four spaces.  Patients were to fill each space with positive things about themselves.  Education:  Self-Esteem, Dentist.   Education Outcome: Acknowledges education/In group clarification offered/Needs additional education  Clinical Observations/Feedback: Pt participated and was engaged. Pt highlighted accomplishment for herself such as the birth of her daughter in 50, remembering an old boyfriend of hers and getting married.  Most of the things patient discussed were random names and thoughts.  Pt did have to be redirected when she became judgmental of a peer that was discussing their addiction.    Victorino Sparrow, LRT/CTRS    Ria Comment, Bryleigh Ottaway A 05/20/2019 11:13 AM

## 2019-05-21 LAB — CBC WITH DIFFERENTIAL/PLATELET
Abs Immature Granulocytes: 0.02 10*3/uL (ref 0.00–0.07)
Basophils Absolute: 0.1 10*3/uL (ref 0.0–0.1)
Basophils Relative: 1 %
Eosinophils Absolute: 0.2 10*3/uL (ref 0.0–0.5)
Eosinophils Relative: 3 %
HCT: 41.8 % (ref 36.0–46.0)
Hemoglobin: 13.3 g/dL (ref 12.0–15.0)
Immature Granulocytes: 0 %
Lymphocytes Relative: 37 %
Lymphs Abs: 2.4 10*3/uL (ref 0.7–4.0)
MCH: 28.2 pg (ref 26.0–34.0)
MCHC: 31.8 g/dL (ref 30.0–36.0)
MCV: 88.7 fL (ref 80.0–100.0)
Monocytes Absolute: 0.6 10*3/uL (ref 0.1–1.0)
Monocytes Relative: 9 %
Neutro Abs: 3.3 10*3/uL (ref 1.7–7.7)
Neutrophils Relative %: 50 %
Platelets: 351 10*3/uL (ref 150–400)
RBC: 4.71 MIL/uL (ref 3.87–5.11)
RDW: 13.2 % (ref 11.5–15.5)
WBC: 6.5 10*3/uL (ref 4.0–10.5)
nRBC: 0 % (ref 0.0–0.2)

## 2019-05-21 LAB — T4, FREE: Free T4: 0.56 ng/dL — ABNORMAL LOW (ref 0.61–1.12)

## 2019-05-21 LAB — VALPROIC ACID LEVEL: Valproic Acid Lvl: 33 ug/mL — ABNORMAL LOW (ref 50.0–100.0)

## 2019-05-21 MED ORDER — NICOTINE POLACRILEX 2 MG MT GUM
CHEWING_GUM | OROMUCOSAL | Status: AC
  Administered 2019-05-21: 2 mg
  Filled 2019-05-21: qty 1

## 2019-05-21 MED ORDER — NICOTINE POLACRILEX 2 MG MT GUM: 2.0000 mg | CHEWING_GUM | OROMUCOSAL | Status: DC | PRN

## 2019-05-21 NOTE — [facility] Group Notes (Deleted)
Adult Psychoeducational Group Note  Date:  05/21/2019 Time:  10:00 PM  Group Topic/Focus:  Wrap-Up Group:   The focus of this group is to help patients review their daily goal of treatment and discuss progress on daily workbooks.  Participation Level:  Active  Participation Quality:  Inattentive  Affect:  Not Congruent  Cognitive:  Lacking  Insight: Lacking and Limited  Engagement in Group:  Distracting  Modes of Intervention:  Limit-setting and Reality Testing  Additional Comments:  Pt attended and participated in wrapup group this evening and rated their day a 10/10. Pt goal was to work on     [employer] 05/21/2019, 10:00 PM

## 2019-05-21 NOTE — Progress Notes (Signed)
Patient ID: Allison Bolton, female   DOB: Nov 09, 1970, 48 y.o.   MRN: [id number]  [place] NOVEL CORONAVIRUS (COVID-19) DAILY CHECK-OFF SYMPTOMS - answer yes or no to each - every day NO YES  Have you had a fever in the past 24 hours?  . Fever (Temp > 37.80C / 100F) X   Have you had any of these symptoms in the past 24 hours? . New Cough .  Sore Throat  .  Shortness of Breath .  Difficulty Breathing .  Unexplained Body Aches   X   Have you had any one of these symptoms in the past 24 hours not related to allergies?   . Runny Nose .  Nasal Congestion .  Sneezing   X   If you have had runny nose, nasal congestion, sneezing in the past 24 hours, has it worsened?  X   EXPOSURES - check yes or no X   Have you traveled outside the state in the past 14 days?  X   Have you been in contact with someone with a confirmed diagnosis of COVID-19 or PUI in the past 14 days without wearing appropriate PPE?  X   Have you been living in the same home as a person with confirmed diagnosis of COVID-19 or a PUI (household contact)?    X   Have you been diagnosed with COVID-19?    X              What to do next: Answered NO to all: Answered YES to anything:   Proceed with unit schedule Follow the [facility] Inpatient Flowsheet.

## 2019-05-21 NOTE — [facility] Group Notes (Signed)
[place] Group Notes:  (Nursing/MHT/Case Management/Adjunct)  Date:  05/21/2019  Time:  0900 am  Type of Therapy:  Nurse Education Health Coping Skills Collages  Participation Level:  Active  Participation Quality:  Appropriate  Affect:  Appropriate  Cognitive:  Disorganized and Confused  Insight:  Limited  Engagement in Group:  Limited  Modes of Intervention:  Activity  Summary of Progress/Problems:  Allison Bolton 05/21/2019, 9:53 AM

## 2019-05-21 NOTE — Progress Notes (Signed)
   05/21/19 1925  Psych Admission Type (Psych Patients Only)  Admission Status Voluntary  Psychosocial Assessment  Patient Complaints None  Eye Contact Fair  Facial Expression Animated  Affect Anxious  Speech Tangential  Interaction Assertive  Motor Activity Fidgety  Appearance/Hygiene Unremarkable  Behavior Characteristics Appropriate to situation  Mood Anxious;Pleasant  Thought Process  Coherency Tangential;Disorganized  Content Delusions;Preoccupation  Delusions Referential  Perception Derealization  Hallucination None reported or observed  Judgment Poor  Confusion Mild  Danger to Self  Current suicidal ideation? Denies  Danger to Others  Danger to Others None reported or observed

## 2019-05-21 NOTE — Progress Notes (Addendum)
[facility] MD Progress Note  05/21/2019 12:08 PM Allison Bolton  MRN:  [id number]    Evaluation: Pamala Hurry observed sitting in day room interacting with peers and staff.  She denies suicidal or homicidal ideations.  Denies auditory or visual hallucination.  She denied auditory or visual hallucinations.  Mild thought blocking noted during this assessment.  Patient reports she would like to see her psychiatrist from [facility].  Patient started demanding results.  patient appears tangential, delusional and confused.  She is alert and oriented to person and place only.  Patient appears to need constant redirection.  Staff reports patient is taking medication as directed.  Denied any concerns or medication side effects.  Patient reported a good appetite.  States she is resting well throughout the night.  We will continue to monitor for safety.  Support, encouragement and  reassurance was provided.   Principal Problem: Bipolar I disorder, current or most recent episode manic, with psychotic features ([place]) Diagnosis: Principal Problem:   Bipolar I disorder, current or most recent episode manic, with psychotic features ([place]) Active Problems:   Delusional disorder ([place])   Elevated TSH  Total Time spent with patient: 20 minutes    Past Medical History:  Past Medical History:  Diagnosis Date  . Arthritis   . Asthma   . Bipolar 1 disorder ([place])   . Diverticulosis   . Dysrhythmia    sts "I have heart palpitations"  . Fibromyalgia   . H/O hiatal hernia    4  . Headache(784.0)   . Meniere's disease   . S/P colonoscopy    Dr. Laural Golden 2010: few small diverticula at sigmoid. otherwise normal.   . Shortness of breath     Past Surgical History:  Procedure Laterality Date  . ABDOMINAL HYSTERECTOMY    . APPENDECTOMY    . CESAREAN SECTION    . CHOLECYSTECTOMY    . exploratory laparoscopy    . FOOT SURGERY    . HEMORRHOID SURGERY    . LAPAROSCOPIC APPENDECTOMY  02/19/2011   Procedure:  APPENDECTOMY LAPAROSCOPIC;  Surgeon: Jamesetta So;  Location: [facility];  Service: General;  Laterality: N/A;  . LAPAROSCOPY  02/19/2011   Procedure: LAPAROSCOPY DIAGNOSTIC;  Surgeon: Jamesetta So;  Location: [facility];  Service: General;  Laterality: N/A;  . left ovarian removal    . multiple hernia repairs    . Right ovarian removal    . TONSILLECTOMY    . TONSILLECTOMY AND ADENOIDECTOMY    . tubes in ears    . UMBILICAL HERNIA REPAIR  Dec 2011   Dr. Arnoldo Morale  . wisdom teeth removal     Family History:  Family History  Problem Relation Age of Onset  . Thyroid disease Mother   . Colon cancer Other        paternal and maternal grandfather  . Meniere's disease Other   . Colon cancer Father   . Colon cancer Brother   . Anesthesia problems Neg Hx   . Hypotension Neg Hx   . Malignant hyperthermia Neg Hx   . 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 Marital  status: Legally Separated    Spouse name: Not on file  . Number of children: Not on file  . Years of education: Not on file  . Highest education level: Not on file  Occupational History  . Not on file  Social Needs  . Financial resource strain: Not on file  . Food insecurity    Worry: Not on file    Inability: Not on file  . Transportation needs    Medical: Not on file    Non-medical: Not on file  Tobacco Use  . Smoking status: Former Smoker    Packs/day: 0.50    Years: 20.00    Pack years: 10.00    Types: Cigarettes    Start date: 07/07/1981    Quit date: 11/05/2018    Years since quitting: 0.5  . Smokeless tobacco: Never Used  Substance and Sexual Activity  . Alcohol use: No    Comment: not since June 2012  . 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 Sexual activity: Never    Birth control/protection: Surgical  Lifestyle  . Physical activity    Days per week: Not on file    Minutes per session: Not on file  . Stress: Not on file  Relationships  . Social Herbalist on phone: Not on file    Gets together: Not on file    Attends religious service: Not on file    Active member of club or organization: Not on file    Attends meetings of clubs or organizations: Not on file    Relationship status: Not on file  Other Topics Concern  . Not on file  Social History Narrative  . Not on file   Additional Social History:   Sleep: Fair, improving  Appetite:  Fair, improving  Current Medications: Current Facility-Administered Medications  Medication Dose Route Frequency Provider Last Rate Last Dose  . acetaminophen (TYLENOL) tablet 650 mg  650 mg Oral Q6H PRN Deloria Lair, NP   650 mg at 05/21/19 0744  . alum & mag hydroxide-simeth (MAALOX/MYLANTA) 200-200-20 MG/5ML suspension 30 mL  30 mL Oral Q4H PRN Lindell Spar I, NP   30 mL at 05/13/19 2317  . benztropine (COGENTIN) tablet 1 mg  1 mg Oral BID Johnn Hai, MD   1 mg at 05/21/19 0745  . carvedilol (COREG) tablet 25 mg  25 mg Oral BID WC Johnn Hai, MD   25 mg at 05/21/19 0745  . divalproex (DEPAKOTE) DR tablet 750 mg  750 mg Oral QPM Sharma Covert, MD   750 mg at 05/20/19 1800  . gabapentin (NEURONTIN) capsule 300 mg  300 mg Oral TID Hampton Abbot, MD   300 mg at 05/21/19 1153  . haloperidol (HALDOL) tablet 5 mg  5 mg Oral TID Hampton Abbot, MD   5 mg at 05/21/19 1153  . LORazepam (ATIVAN) tablet 2 mg  2 mg Oral Q4H PRN Johnn Hai, MD   2 mg at 05/19/19 2111   Or  . LORazepam (ATIVAN) injection 2 mg  2 mg Intramuscular Q4H PRN Johnn Hai, MD      . magnesium hydroxide (MILK OF MAGNESIA) suspension 30 mL  30 mL Oral Daily PRN Anike, Adaku C, NP   30 mL at 05/12/19 0526  . naproxen  (NAPROSYN) tablet 500 mg  500 mg Oral BID Mordecai Maes, NP   500 mg at 05/21/19 0744  .  nicotine polacrilex (NICORETTE) gum 2 mg  2 mg Oral PRN Derrill Center, NP      . pantoprazole (PROTONIX) EC tablet 40 mg  40 mg Oral Daily Mordecai Maes, NP   40 mg at 05/21/19 0743  . QUEtiapine (SEROQUEL) tablet 100 mg  100 mg Oral BID Johnn Hai, MD   100 mg at 05/21/19 0745  . QUEtiapine (SEROQUEL) tablet 400 mg  400 mg Oral QHS Hampton Abbot, MD   400 mg at 05/20/19 2054  . traZODone (DESYREL) tablet 200 mg  200 mg Oral QHS Johnn Hai, MD   200 mg at 05/20/19 2054    Lab Results:  Results for orders placed or performed during the hospital encounter of 05/10/19 (from the past 48 hour(s))  Valproic acid level     Status: Abnormal   Collection Time: 05/21/19  6:34 AM  Result Value Ref Range   Valproic Acid Lvl 33 (L) 50.0 - 100.0 ug/mL    Comment: Performed at [facility], [place] [address], [place], [place] [address]  T4, free     Status: Abnormal   Collection Time: 05/21/19  6:34 AM  Result Value Ref Range   Free T4 0.56 (L) 0.61 - 1.12 ng/dL    Comment: (NOTE) Biotin ingestion may interfere with free T4 tests. If the results are inconsistent with the TSH level, previous test results, or the clinical presentation, then consider biotin interference. If needed, order repeat testing after stopping biotin. Performed at [facility], [place] [address] [place], [place] [address]   CBC with Differential/Platelet     Status: None   Collection Time: 05/21/19  6:34 AM  Result Value Ref Range   WBC 6.5 4.0 - 10.5 K/uL   RBC 4.71 3.87 - 5.11 MIL/uL   Hemoglobin 13.3 12.0 - 15.0 g/dL   HCT 41.8 36.0 - 46.0 %   MCV 88.7 80.0 - 100.0 fL   MCH 28.2 26.0 - 34.0 pg   MCHC 31.8 30.0 - 36.0 g/dL   RDW 13.2 11.5 - 15.5 %   Platelets 351 150 - 400 K/uL   nRBC 0.0 0.0 - 0.2 %   Neutrophils Relative % 50 %   Neutro Abs 3.3 1.7 - 7.7 K/uL   Lymphocytes Relative 37 %    Lymphs Abs 2.4 0.7 - 4.0 K/uL   Monocytes Relative 9 %   Monocytes Absolute 0.6 0.1 - 1.0 K/uL   Eosinophils Relative 3 %   Eosinophils Absolute 0.2 0.0 - 0.5 K/uL   Basophils Relative 1 %   Basophils Absolute 0.1 0.0 - 0.1 K/uL   Immature Granulocytes 0 %   Abs Immature Granulocytes 0.02 0.00 - 0.07 K/uL    Comment: Performed at [facility], [place] [address], [place], [place] [address]    Blood Alcohol level:  Lab Results  Component Value Date   [facility] <10 05/07/2019   ETH <10 [id number]    Metabolic Disorder Labs: Lab Results  Component Value Date   HGBA1C 5.3 05/11/2019   MPG 105.41 05/11/2019   MPG 114 05/13/2013   Lab Results  Component Value Date   PROLACTIN 24.1 (H) 05/11/2019   Lab Results  Component Value Date   CHOL 139 05/11/2019   TRIG 66 05/11/2019   HDL 44 05/11/2019   CHOLHDL 3.2 05/11/2019   VLDL 13 05/11/2019   LDLCALC 82 05/11/2019   LDLCALC 88 05/13/2013    Physical Findings: AIMS: Facial and Oral Movements Muscles  of Facial Expression: None, normal Lips and Perioral Area: None, normal Jaw: None, normal Tongue: None, normal,Extremity Movements Upper (arms, wrists, hands, fingers): None, normal Lower (legs, knees, ankles, toes): None, normal, Trunk Movements Neck, shoulders, hips: None, normal, Overall Severity Severity of abnormal movements (highest score from questions above): None, normal Incapacitation due to abnormal movements: None, normal Patient's awareness of abnormal movements (rate only patient's report): No Awareness, Dental Status Current problems with teeth and/or dentures?: No Does patient usually wear dentures?: No  CIWA:  CIWA-Ar Total: 1 COWS:  COWS Total Score: 1  Musculoskeletal: Strength & Muscle Tone: within normal limits Gait & Station: normal Patient leans: N/A  Psychiatric Specialty Exam: Physical Exam  Nursing note and vitals reviewed. Constitutional: She appears well-nourished. No distress.   Neck: Normal range of motion.  Respiratory: Effort normal.  Neurological: She is alert.  Skin: Skin is warm and dry.  Psychiatric: She is actively hallucinating. She expresses impulsivity. She exhibits a depressed mood.    Review of Systems  Endo/Heme/Allergies:       Reports history of hypothyroidism  Psychiatric/Behavioral: Positive for depression and hallucinations. The patient is nervous/anxious.   All other systems reviewed and are negative.   Blood pressure 108/74, pulse 72, temperature 97.8 F (36.6 C), temperature source Oral, resp. rate 20, height 5\' 6"  (1.676 m), weight 86.6 kg, SpO2 97 %.Body mass index is 30.83 kg/m.  General Appearance: Casual  Eye Contact:  Good  Speech:  Clear and Coherent and Pressured  Volume:  Increased  Mood:  Still manic and thought and behavior  Affect:  Congruent  Thought Process:  Irrelevant and Descriptions of Associations: Loose  Orientation:  Full (Time, Place, and Person)  Thought Content:  Illogical and Delusions  Suicidal Thoughts:  No  Homicidal Thoughts:  no  Memory:  Immediate;   Fair Recent;   Fair Remote;   Fair  Judgement:  Fair  Insight:  Fair  Psychomotor Activity:  Normal  Concentration:  Concentration: Fair and Attention Span: Fair  Recall:  [employer] of Knowledge:  Fair  Language:  Fair  Akathisia:  Negative  Handed:  Right  AIMS (if indicated):     Assets:  Physical Health Resilience  ADL's:  Intact  Cognition:  WNL  Sleep:  Number of Hours: 5.75     Treatment Plan Summary: Daily contact with patient to assess and evaluate symptoms and progress in treatment and Medication management   Continue with current treatment plan on 05/21/2019 as listed below except were noted  05/21/2019 Labs reviewed: T4- 0.56, T3 still in process  Bipolar 1 disorder with psychotic features Medication management:   Continue Cogentin 1 mg twice daily for EPS Continue Depakote DR 750 mg every morning for bipolar disorder  (mood stabilization) Continue to Haldol 5 mg 3 times daily for psychosis Continue to Neurontin 300 mg 3 times daily  Continue Ativan 2 mg every 4 hours as needed agitation/anxiety as needed Continue Seroquel 100 mg by a day and 400 mg at bedtime Discontinue Seroquel 50 mg every 4 hours as needed Discontinue Ambien 15 mg nightly  CSW to continue working on discharge disposition Patient encouraged to participate within the therapeutic milieu  Derrill Center, NP 05/21/2019, 12:08 PM Attest to NP note

## 2019-05-21 NOTE — Plan of Care (Signed)
  Problem: Activity: Goal: Interest or engagement in activities will improve Outcome: Progressing   Problem: Coping: Goal: Ability to verbalize frustrations and anger appropriately will improve Outcome: Progressing Goal: Ability to demonstrate self-control will improve Outcome: Progressing   

## 2019-05-21 NOTE — [facility] Group Notes (Signed)
LCSW Group Therapy Note  Date/Time:  05/21/2019   11:00AM  Type of Therapy and Topic:  Group Therapy:  Fears and Unhealthy/Healthy Coping Skills  Participation Level:  Active   Description of Group:  The focus of this group was to discuss some of the prevalent fears that patients experience, and to identify the commonalities among group members.  A fun exercise was used to initiate the discussion, followed by writing on the white board a group-generated list of unhealthy coping and healthy coping techniques to deal with each fear.    Therapeutic Goals: 1. Patient will be able to distinguish between healthy and unhealthy coping skills 2. Patient will identify and describe 3 fears they experience 3. Patient will identify one positive coping strategy for each fear they experience 4. Patient will respond empathetically to peers' statements regarding fears they experience  Summary of Patient Progress:  The patient expressed herself freely at first in group.  As we discussed unhealthy coping techniques that people might use, she became tearful.  She left the room, then returned, left again, and never again settled down in a seat.  Therapeutic Modalities Cognitive Behavioral Therapy Motivational Interviewing  Selmer Dominion, LCSW

## 2019-05-22 LAB — T3, FREE: T3, Free: 3.2 pg/mL (ref 2.0–4.4)

## 2019-05-22 NOTE — Progress Notes (Signed)
DAR NOTE: Patient presents with anxious affect and paranoid behavior.  Denies pain, auditory and visual hallucinations.  Rates depression at 0, hopelessness at 0, and anxiety at 0.  Maintained on routine safety checks.  Medications given as prescribed.  Support and encouragement offered as needed.  Attended group and participated.  States goal for today is "my family."  Patient observed pacing the hallway angry about her family situation.  needed a lot of redirection on the unit.  Preoccupied with getting discharge.  Patient is safe on and off the unit.

## 2019-05-22 NOTE — Progress Notes (Signed)
[facility] Group Notes:  (Nursing/MHT/Case Management/Adjunct)  Date:  05/22/2019  Time:  1000  Type of Therapy:  Nurse Education  Participation Level:  Active  Participation Quality:  Appropriate  Affect:  Appropriate  Cognitive:  Disorganized  Insight:  Improving  Engagement in Group:  Engaged  Modes of Intervention:  Activity  Summary of Progress/Problems: Exercise and therapeutic ball activity

## 2019-05-22 NOTE — Progress Notes (Signed)
Adult Psychoeducational Group Note  Date:  05/22/2019 Time:  9:38 PM  Group Topic/Focus:  Wrap-Up Group:   The focus of this group is to help patients review their daily goal of treatment and discuss progress on daily workbooks.  Participation Level:  Active  Participation Quality:  Appropriate  Affect:  Appropriate  Cognitive:  Appropriate  Insight: Appropriate  Engagement in Group:  Engaged  Modes of Intervention:  Discussion, Socialization and Support  Additional Comments:  Pt attended and engaged in wrap up group. Pt goal was to prepare for discharge. Pt rated her day a 10/10.   [name] [name] 05/22/2019, 9:38 PM

## 2019-05-22 NOTE — [facility] Group Notes (Signed)
[facility] LCSW Group Therapy Note  Date/Time:  05/22/2019  11:00AM-12:00PM  Type of Therapy and Topic:  Group Therapy:  Music and Mood  Participation Level:  Active   Description of Group: In this process group, members listened to a variety of genres of music and identified that different types of music evoke different responses.  Patients were encouraged to identify music that was soothing for them and music that was energizing for them.  Patients discussed how this knowledge can help with wellness and recovery in various ways including managing depression and anxiety as well as encouraging healthy sleep habits.    Therapeutic Goals: 1. Patients will explore the impact of different varieties of music on mood 2. Patients will verbalize the thoughts they have when listening to different types of music 3. Patients will identify music that is soothing to them as well as music that is energizing to them 4. Patients will discuss how to use this knowledge to assist in maintaining wellness and recovery 5. Patients will explore the use of music as a coping skill  Summary of Patient Progress:  At the beginning of group, patient expressed that she felt great, with anxiety however that she rated at a 5 on a 1-10 scale.  She was up and down, in and out of the room numerous times.  At the end of group, patient expressed that her anxiety was down to a 1.    Therapeutic Modalities: Solution Focused Brief Therapy Activity   Selmer Dominion, LCSW

## 2019-05-22 NOTE — Progress Notes (Addendum)
[facility] MD Progress Note  05/22/2019 10:31 AM Allison Bolton  MRN:  [id number]    Evaluation: Allison Bolton seen sitting  in dayroom interacting with peers.  Allison Bolton continues to request to be discharged as she reports she has a family reunion to attend to today.  Reports a family meeting will be held in [place].  Continues to present with delusions and mood lability.  She denies suicidal or homicidal ideations.  Denies auditory or visual hallucinations.  Staff reports patient is taking medications as directed.  Patient denies any medication side effects.  She is alert and oriented x3.  Reports a good appetite.  States she is resting well throughout the night.  Reports she has plans to reside with family relatives Allison Bolton and Allison Bolton at discharge.  Reports she has been working on ways to improve family communication staff to continue to monitor for safety.  Chart review T3 still pending. " active in process."  T-4 completed 0.56ng/dL results.  Patient observed attending daily group session.  Support, encouragement and reassurance was provided.     Principal Problem: Bipolar I disorder, current or most recent episode manic, with psychotic features ([place]) Diagnosis: Principal Problem:   Bipolar I disorder, current or most recent episode manic, with psychotic features ([place]) Active Problems:   Delusional disorder ([place])   Elevated TSH  Total Time spent with patient: 20 minutes    Past Medical History:  Past Medical History:  Diagnosis Date  . Arthritis   . Asthma   . Bipolar 1 disorder ([place])   . Diverticulosis   . Dysrhythmia    sts "I have heart palpitations"  . Fibromyalgia   . H/O hiatal hernia    4  . Headache(784.0)   . Meniere's disease   . S/P colonoscopy    Dr. Laural Golden 2010: few small diverticula at sigmoid. otherwise normal.   . Shortness of breath     Past Surgical History:  Procedure Laterality Date  . ABDOMINAL HYSTERECTOMY    . APPENDECTOMY    . CESAREAN SECTION    .  CHOLECYSTECTOMY    . exploratory laparoscopy    . FOOT SURGERY    . HEMORRHOID SURGERY    . LAPAROSCOPIC APPENDECTOMY  02/19/2011   Procedure: APPENDECTOMY LAPAROSCOPIC;  Surgeon: Jamesetta So;  Location: [facility];  Service: General;  Laterality: N/A;  . LAPAROSCOPY  02/19/2011   Procedure: LAPAROSCOPY DIAGNOSTIC;  Surgeon: Jamesetta So;  Location: [facility];  Service: General;  Laterality: N/A;  . left ovarian removal    . multiple hernia repairs    . Right ovarian removal    . TONSILLECTOMY    . TONSILLECTOMY AND ADENOIDECTOMY    . tubes in ears    . UMBILICAL HERNIA REPAIR  Dec 2011   Dr. Arnoldo Morale  . wisdom teeth removal     Family History:  Family History  Problem Relation Age of Onset  . Thyroid disease Mother   . Colon cancer Other        paternal and maternal grandfather  . Meniere's disease Other   . Colon cancer Father   . Colon cancer Brother   . Anesthesia problems Neg Hx   . Hypotension Neg Hx   . Malignant hyperthermia Neg Hx   . 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 Marital status: Legally Separated    Spouse name: Not on file  . Number of children: Not on file  . Years of education: Not on file  . Highest education level: Not on file  Occupational History  . Not on file  Social Needs  . Financial resource strain: Not on file  . Food insecurity    Worry: Not on file    Inability: Not on file  . Transportation needs    Medical: Not on file    Non-medical: Not on file  Tobacco Use  . Smoking status: Former Smoker    Packs/day: 0.50    Years: 20.00    Pack years: 10.00    Types: Cigarettes    Start date: 07/07/1981    Quit date: 11/05/2018    Years since  quitting: 0.5  . Smokeless tobacco: Never Used  Substance and Sexual Activity  . Alcohol use: No    Comment: not since June 2012  . 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 Sexual activity: Never    Birth control/protection: Surgical  Lifestyle  . Physical activity    Days per week: Not on file    Minutes per session: Not on file  . Stress: Not on file  Relationships  . Social Herbalist on phone: Not on file    Gets together: Not on file    Attends religious service: Not on file    Active member of club or organization: Not on file    Attends meetings of clubs or organizations: Not on file    Relationship status: Not on file  Other Topics Concern  . Not on file  Social History Narrative  . Not on file   Additional Social History:   Sleep: Fair, improving  Appetite:  Fair, improving  Current Medications: Current Facility-Administered Medications  Medication Dose Route Frequency Provider Last Rate Last Dose  . acetaminophen (TYLENOL) tablet 650 mg  650 mg Oral Q6H PRN Deloria Lair, NP   650 mg at 05/21/19 2103  . alum & mag hydroxide-simeth (MAALOX/MYLANTA) 200-200-20 MG/5ML suspension 30 mL  30 mL Oral Q4H PRN Lindell Spar I, NP   30 mL at 05/13/19 2317  . benztropine (COGENTIN) tablet 1 mg  1 mg Oral BID Johnn Hai, MD   1 mg at 05/22/19 0813  . carvedilol (COREG) tablet 25 mg  25 mg Oral BID WC Johnn Hai, MD   25 mg at 05/22/19 0813  . divalproex (DEPAKOTE) DR tablet 750 mg  750 mg Oral QPM Sharma Covert, MD   750 mg at 05/21/19 1732  . gabapentin (NEURONTIN) capsule 300 mg  300 mg Oral TID Hampton Abbot, MD   300 mg at 05/22/19 0813  . haloperidol (HALDOL) tablet 5 mg  5 mg Oral TID Hampton Abbot, MD   5 mg at 05/22/19 0815  . LORazepam (ATIVAN) tablet 2 mg  2 mg Oral Q4H PRN Johnn Hai, MD   2 mg at 05/19/19 2111   Or  . LORazepam (ATIVAN) injection 2 mg  2 mg Intramuscular Q4H PRN Johnn Hai, MD      . magnesium hydroxide (MILK OF MAGNESIA) suspension 30 mL  30 mL Oral Daily PRN  Anike, Adaku C, NP   30 mL at 05/12/19 0526  . naproxen (NAPROSYN) tablet 500 mg  500 mg Oral BID Mordecai Maes, NP   500 mg at 05/22/19 [id number]  . nicotine polacrilex (NICORETTE) gum 2 mg  2 mg Oral PRN Derrill Center, NP      . pantoprazole (PROTONIX) EC tablet 40 mg  40 mg Oral Daily Mordecai Maes, NP   40 mg at 05/22/19 0813  . QUEtiapine (SEROQUEL) tablet 100 mg  100 mg Oral BID Johnn Hai, MD   100 mg at 05/22/19 0815  . QUEtiapine (SEROQUEL) tablet 400 mg  400 mg Oral QHS Hampton Abbot, MD   400 mg at 05/21/19 2104  . traZODone (DESYREL) tablet 200 mg  200 mg Oral QHS Johnn Hai, MD   200 mg at 05/21/19 2104    Lab Results:  Results for orders placed or performed during the hospital encounter of 05/10/19 (from the past 48 hour(s))  Valproic acid level     Status: Abnormal   Collection Time: 05/21/19  6:34 AM  Result Value Ref Range   Valproic Acid Lvl 33 (L) 50.0 - 100.0 ug/mL    Comment: Performed at [facility], [place] [address], [place], [place] [address]  T4, free     Status: Abnormal   Collection Time: 05/21/19  6:34 AM  Result Value Ref Range   Free T4 0.56 (L) 0.61 - 1.12 ng/dL    Comment: (NOTE) Biotin ingestion may interfere with free T4 tests. If the results are inconsistent with the TSH level, previous test results, or the clinical presentation, then consider biotin interference. If needed, order repeat testing after stopping biotin. Performed at [facility], [place] [address], [place], [place] [address]   CBC with Differential/Platelet     Status: None   Collection Time: 05/21/19  6:34 AM  Result Value Ref Range   WBC 6.5 4.0 - 10.5 K/uL   RBC 4.71 3.87 - 5.11 MIL/uL   Hemoglobin 13.3 12.0 - 15.0 g/dL   HCT 41.8 36.0 - 46.0 %   MCV 88.7 80.0 - 100.0 fL   MCH 28.2 26.0 - 34.0 pg   MCHC 31.8 30.0 - 36.0 g/dL   RDW 13.2 11.5 - 15.5 %    Platelets 351 150 - 400 K/uL   nRBC 0.0 0.0 - 0.2 %   Neutrophils Relative % 50 %   Neutro Abs 3.3 1.7 - 7.7 K/uL   Lymphocytes Relative 37 %   Lymphs Abs 2.4 0.7 - 4.0 K/uL   Monocytes Relative 9 %   Monocytes Absolute 0.6 0.1 - 1.0 K/uL   Eosinophils Relative 3 %   Eosinophils Absolute 0.2 0.0 - 0.5 K/uL   Basophils Relative 1 %   Basophils Absolute 0.1 0.0 - 0.1 K/uL   Immature Granulocytes 0 %   Abs Immature Granulocytes 0.02 0.00 - 0.07 K/uL    Comment: Performed at [name], [place] [address], [place], [place] [address]    Blood Alcohol level:  Lab Results  Component Value Date   [facility] <10 05/07/2019   ETH <10 [id number]    Metabolic Disorder Labs: Lab Results  Component Value Date   HGBA1C 5.3 05/11/2019   MPG 105.41 05/11/2019   MPG 114 05/13/2013   Lab Results  Component Value Date   PROLACTIN 24.1 (H) 05/11/2019   Lab Results  Component Value Date   CHOL 139 05/11/2019   TRIG 66 05/11/2019  HDL 44 05/11/2019   CHOLHDL 3.2 05/11/2019   VLDL 13 05/11/2019   LDLCALC 82 05/11/2019   LDLCALC 88 05/13/2013    Physical Findings: AIMS: Facial and Oral Movements Muscles of Facial Expression: None, normal Lips and Perioral Area: None, normal Jaw: None, normal Tongue: None, normal,Extremity Movements Upper (arms, wrists, hands, fingers): None, normal Lower (legs, knees, ankles, toes): None, normal, Trunk Movements Neck, shoulders, hips: None, normal, Overall Severity Severity of abnormal movements (highest score from questions above): None, normal Incapacitation due to abnormal movements: None, normal Patient's awareness of abnormal movements (rate only patient's report): No Awareness, Dental Status Current problems with teeth and/or dentures?: No Does patient usually wear dentures?: No  CIWA:  CIWA-Ar Total: 1 COWS:  COWS Total Score: 1  Musculoskeletal: Strength & Muscle Tone: within normal limits Gait & Station: normal Patient  leans: N/A  Psychiatric Specialty Exam: Physical Exam  Nursing note and vitals reviewed. Constitutional: She appears well-nourished.  Neck: Normal range of motion.  Respiratory: Effort normal.  Neurological: She is alert.  Skin: Skin is warm and dry.  Psychiatric: She is actively hallucinating. She expresses impulsivity. She exhibits a depressed mood.    Review of Systems  Endo/Heme/Allergies:       Reports history of hypothyroidism  Psychiatric/Behavioral: Positive for depression and hallucinations. The patient is nervous/anxious.   All other systems reviewed and are negative.   Blood pressure 109/85, pulse 77, temperature 98.1 F (36.7 C), temperature source Oral, resp. rate 18, height 5\' 6"  (1.676 m), weight 86.6 kg, SpO2 97 %.Body mass index is 30.83 kg/m.  General Appearance: Casual  Eye Contact:  Good  Speech:  Clear and Coherent and Pressured  Volume:  Increased  Mood:  Still manic and thought and behavior  Affect:  Congruent  Thought Process:  Irrelevant and Descriptions of Associations: Loose  Orientation:  Full (Time, Place, and Person)  Thought Content:  Illogical and Delusions  Suicidal Thoughts:  No  Homicidal Thoughts:  no  Memory:  Immediate;   Fair Recent;   Fair Remote;   Fair  Judgement:  Fair  Insight:  Fair  Psychomotor Activity:  Normal  Concentration:  Concentration: Fair and Attention Span: Fair  Recall:  [employer] of Knowledge:  Fair  Language:  Fair  Akathisia:  Negative  Handed:  Right  AIMS (if indicated):     Assets:  Physical Health Resilience  ADL's:  Intact  Cognition:  WNL  Sleep:  Number of Hours: 6.5     Treatment Plan Summary: Daily contact with patient to assess and evaluate symptoms and progress in treatment and Medication management   Continue with current treatment plan on 05/22/2019 as listed below except were noted  05/22/2019 Labs reviewed: T4- 0.56, T3 still in process  Bipolar 1 disorder with psychotic  features Medication management:   Continue Cogentin 1 mg twice daily for EPS Continue Depakote DR 750 mg every morning for bipolar disorder (mood stabilization) Continue to Haldol 5 mg 3 times daily for psychosis Continue to Neurontin 300 mg 3 times daily  Continue Ativan 2 mg every 4 hours as needed agitation/anxiety as needed Continue Seroquel 100 mg by a day and 400 mg at bedtime Discontinue Seroquel 50 mg every 4 hours as needed Discontinue Ambien 15 mg nightly  CSW to continue working on discharge disposition Patient encouraged to participate within the therapeutic milieu  Derrill Center, NP 05/22/2019, 10:31 AM  Attest to NP note

## 2019-05-23 MED ORDER — HALOPERIDOL 5 MG PO TABS
10.0000 mg | ORAL_TABLET | Freq: Three times a day (TID) | ORAL | Status: DC
  Administered 2019-05-23 – 2019-05-26 (×9): 10 mg via ORAL
  Filled 2019-05-23 (×15): qty 2

## 2019-05-23 NOTE — [facility] Group Notes (Signed)
[place] LCSW Group Therapy Note  Date/Time: 05/23/2019 @ 1:30pm  Type of Therapy and Topic:  Group Therapy:  Overcoming Obstacles  Participation Level:  [facility] PARTICIPATION LEVEL: Active  Description of Group:    In this group patients will be encouraged to explore what they see as obstacles to their own wellness and recovery. They will be guided to discuss their thoughts, feelings, and behaviors related to these obstacles. The group will process together ways to cope with barriers, with attention given to specific choices patients can make. Each patient will be challenged to identify changes they are motivated to make in order to overcome their obstacles. This group will be process-oriented, with patients participating in exploration of their own experiences as well as giving and receiving support and challenge from other group members.  Therapeutic Goals: 1. Patient will identify personal and current obstacles as they relate to admission. 2. Patient will identify barriers that currently interfere with their wellness or overcoming obstacles.  3. Patient will identify feelings, thought process and behaviors related to these barriers. 4. Patient will identify two changes they are willing to make to overcome these obstacles:    Summary of Patient Progress   Patient was active and engaged throughout group therapy today. Patient was able to identify current obstacle of trying to stay away from drug addicts and alcoholics. Patient was able to identify barriers that currently interferes with the overcoming her obstacle which is the people in her past. Patient was able to identify a change that she is willing to make to overcome her obstacle which is talking to her uncle.    Therapeutic Modalities:   Cognitive Behavioral Therapy Solution Focused Therapy Motivational Interviewing Relapse Prevention Therapy   Ardelle Anton, LCSW

## 2019-05-23 NOTE — Progress Notes (Signed)
   05/22/19 2000  Psych Admission Type (Psych Patients Only)  Admission Status Voluntary  Psychosocial Assessment  Patient Complaints None  Eye Contact Fair  Facial Expression Animated  Affect Anxious  Speech Tangential  Interaction Assertive  Motor Activity Fidgety  Appearance/Hygiene Unremarkable  Behavior Characteristics Cooperative;Appropriate to situation  Mood Labile  Thought Process  Coherency Tangential  Content Delusions;Preoccupation  Delusions Referential  Perception Derealization  Hallucination None reported or observed  Judgment Poor  Confusion Mild  Danger to Self  Current suicidal ideation? Denies  Danger to Others  Danger to Others None reported or observed

## 2019-05-23 NOTE — Progress Notes (Signed)
[facility] MD Progress Note  05/23/2019 10:57 AM Allison Bolton  MRN:  [id number] Subjective:    Patient remains easily agitated on interview when she learned she is not immediately scheduled for discharge, she states she will "call the sheriff" and she has to check on the safety of her child.  She also claims that he has multiple sclerosis but we cannot confirm this.  Again she is not logical in her thought pattern her uncle/caretaker/friend states she is not baseline and is telling him still delusional material as of yesterday. No involuntary movements generally compliant though  Principal Problem: Bipolar I disorder, current or most recent episode manic, with psychotic features ([place]) Diagnosis: Principal Problem:   Bipolar I disorder, current or most recent episode manic, with psychotic features ([place]) Active Problems:   Delusional disorder ([place])   Elevated TSH  Total Time spent with patient: 20 minutes  Past Psychiatric History: Prior episodes  Past Medical History:  Past Medical History:  Diagnosis Date  . Arthritis   . Asthma   . Bipolar 1 disorder ([place])   . Diverticulosis   . Dysrhythmia    sts "I have heart palpitations"  . Fibromyalgia   . H/O hiatal hernia    4  . Headache(784.0)   . Meniere's disease   . S/P colonoscopy    Dr. Laural Golden 2010: few small diverticula at sigmoid. otherwise normal.   . Shortness of breath     Past Surgical History:  Procedure Laterality Date  . ABDOMINAL HYSTERECTOMY    . APPENDECTOMY    . CESAREAN SECTION    . CHOLECYSTECTOMY    . exploratory laparoscopy    . FOOT SURGERY    . HEMORRHOID SURGERY    . LAPAROSCOPIC APPENDECTOMY  02/19/2011   Procedure: APPENDECTOMY LAPAROSCOPIC;  Surgeon: Jamesetta So;  Location: [facility];  Service: General;  Laterality: N/A;  . LAPAROSCOPY  02/19/2011   Procedure: LAPAROSCOPY DIAGNOSTIC;  Surgeon: Jamesetta So;  Location: [facility];  Service: General;  Laterality: N/A;  . left ovarian removal    .  multiple hernia repairs    . Right ovarian removal    . TONSILLECTOMY    . TONSILLECTOMY AND ADENOIDECTOMY    . tubes in ears    . UMBILICAL HERNIA REPAIR  Dec 2011   Dr. Arnoldo Morale  . wisdom teeth removal     Family History:  Family History  Problem Relation Age of Onset  . Thyroid disease Mother   . Colon cancer Other        paternal and maternal grandfather  . Meniere's disease Other   . Colon cancer Father   . Colon cancer Brother   . Anesthesia problems Neg Hx   . Hypotension Neg Hx   . Malignant hyperthermia Neg Hx   . 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 Marital status: Legally Separated    Spouse name: Not on file  . Number of children: Not on file  . Years of education: Not on file  . Highest education level: Not on file  Occupational History  . Not on  file  Social Needs  . Financial resource strain: Not on file  . Food insecurity    Worry: Not on file    Inability: Not on file  . Transportation needs    Medical: Not on file    Non-medical: Not on file  Tobacco Use  . Smoking status: Former Smoker    Packs/day: 0.50    Years: 20.00    Pack years: 10.00    Types: Cigarettes    Start date: 07/07/1981    Quit date: 11/05/2018    Years since quitting: 0.5  . Smokeless tobacco: Never Used  Substance and Sexual Activity  . Alcohol use: No    Comment: not since June 2012  . 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 Sexual activity: Never    Birth control/protection: Surgical  Lifestyle  . Physical activity    Days per week: Not on file    Minutes per session: Not on file  . Stress:  Not on file  Relationships  . Social Herbalist on phone: Not on file    Gets together: Not on file    Attends religious service: Not on file    Active member of club or organization: Not on file    Attends meetings of clubs or organizations: Not on file    Relationship status: Not on file  Other Topics Concern  . Not on file  Social History Narrative  . Not on file   Additional Social History:                         Sleep: Fair  Appetite:  Fair  Current Medications: Current Facility-Administered Medications  Medication Dose Route Frequency Provider Last Rate Last Dose  . acetaminophen (TYLENOL) tablet 650 mg  650 mg Oral Q6H PRN Deloria Lair, NP   650 mg at 05/21/19 2103  . alum & mag hydroxide-simeth (MAALOX/MYLANTA) 200-200-20 MG/5ML suspension 30 mL  30 mL Oral Q4H PRN Lindell Spar I, NP   30 mL at 05/13/19 2317  . benztropine (COGENTIN) tablet 1 mg  1 mg Oral BID Johnn Hai, MD   1 mg at 05/23/19 0803  . carvedilol (COREG) tablet 25 mg  25 mg Oral BID WC Johnn Hai, MD   25 mg at 05/23/19 0803  . divalproex (DEPAKOTE) DR tablet 750 mg  750 mg Oral QPM Sharma Covert, MD   750 mg at 05/22/19 1702  . gabapentin (NEURONTIN) capsule 300 mg  300 mg Oral TID Hampton Abbot, MD   300 mg at 05/23/19 0802  . haloperidol (HALDOL) tablet 10 mg  10 mg Oral TID Johnn Hai, MD      . LORazepam (ATIVAN) tablet 2 mg  2 mg Oral Q4H PRN Johnn Hai, MD   2 mg at 05/19/19 2111   Or  . LORazepam (ATIVAN) injection 2 mg  2 mg Intramuscular Q4H PRN Johnn Hai, MD      . magnesium hydroxide (MILK OF MAGNESIA) suspension 30 mL  30 mL Oral Daily PRN Anike, Adaku C, NP   30 mL at 05/12/19 0526  . naproxen (NAPROSYN) tablet 500 mg  500 mg Oral BID Mordecai Maes, NP   500 mg at 05/23/19 0802  . nicotine polacrilex (NICORETTE) gum 2 mg  2 mg Oral PRN Derrill Center, NP      . pantoprazole (PROTONIX) EC  tablet 40 mg  40 mg Oral Daily Mordecai Maes, NP   40 mg  at 05/23/19 0803  . QUEtiapine (SEROQUEL) tablet 400 mg  400 mg Oral QHS Hampton Abbot, MD   400 mg at 05/22/19 2319  . traZODone (DESYREL) tablet 200 mg  200 mg Oral QHS Johnn Hai, MD   200 mg at 05/22/19 2319    Lab Results: No results found for this or any previous visit (from the past 48 hour(s)).  Blood Alcohol level:  Lab Results  Component Value Date   ETH <10 05/07/2019   ETH <10 [id number]    Metabolic Disorder Labs: Lab Results  Component Value Date   HGBA1C 5.3 05/11/2019   MPG 105.41 05/11/2019   MPG 114 05/13/2013   Lab Results  Component Value Date   PROLACTIN 24.1 (H) 05/11/2019   Lab Results  Component Value Date   CHOL 139 05/11/2019   TRIG 66 05/11/2019   HDL 44 05/11/2019   CHOLHDL 3.2 05/11/2019   VLDL 13 05/11/2019   LDLCALC 82 05/11/2019   LDLCALC 88 05/13/2013    Physical Findings: AIMS: Facial and Oral Movements Muscles of Facial Expression: None, normal Lips and Perioral Area: None, normal Jaw: None, normal Tongue: None, normal,Extremity Movements Upper (arms, wrists, hands, fingers): None, normal Lower (legs, knees, ankles, toes): None, normal, Trunk Movements Neck, shoulders, hips: None, normal, Overall Severity Severity of abnormal movements (highest score from questions above): None, normal Incapacitation due to abnormal movements: None, normal Patient's awareness of abnormal movements (rate only patient's report): No Awareness, Dental Status Current problems with teeth and/or dentures?: No Does patient usually wear dentures?: No  CIWA:  CIWA-Ar Total: 1 COWS:  COWS Total Score: 1  Musculoskeletal: Strength & Muscle Tone: within normal limits Gait & Station: normal Patient leans: N/A  Psychiatric Specialty Exam: Physical Exam  ROS  Blood pressure 108/76, pulse 75, temperature (!) 97.4 F (36.3 C), temperature source Oral, resp. rate 18, height 5\' 6"  (1.676 m), weight 86.6 kg, SpO2 97 %.Body mass index is 30.83 kg/m.   General Appearance: Casual  Eye Contact:  Good  Speech:  Pressured  Volume:  Increased  Mood:  Hypomanic and easily irritable  Affect:  Full Range  Thought Process:  Irrelevant and Descriptions of Associations: Loose  Orientation:  Full (Time, Place, and Person)  Thought Content:  Again delusional believes that are ill-defined and claiming individuals as her children that are not  Suicidal Thoughts:  No  Homicidal Thoughts:  No  Memory:  Immediate;   Fair Recent;   Fair Remote;   Fair  Judgement:  Fair  Insight:  Fair  Psychomotor Activity:  Normal  Concentration:  Concentration: Fair and Attention Span: Fair  Recall:  [employer] of Knowledge:  Fair  Language:  Fair  Akathisia:  Negative  Handed:  Right  AIMS (if indicated):     Assets:  Physical Health Resilience  ADL's:  Intact  Cognition:  WNL  Sleep:  Number of Hours: 6.25     Treatment Plan Summary: Daily contact with patient to assess and evaluate symptoms and progress in treatment and Medication management Continue current cognitive therapy continue to monitor for safety again we will continue antipsychotic therapy and reality based therapy probable discharge within the next 2 to 3 days   Gino Garrabrant, MD 05/23/2019, 10:57 AM

## 2019-05-23 NOTE — Progress Notes (Signed)
Recreation Therapy Notes  Date: 11.16.20 Time: 1000 Location: 500 Hall Dayroom  Group Topic: Anxiety  Goal Area(s) Addresses:  Patient will identify triggers to anxiety. Patient will identify physical symptoms and thoughts when anxious. Patient will identify coping skills for dealing with anxiety.  Behavioral Response: Engaged  Intervention: Worksheet, pencils  Activity: Introduction of Anxiety.  Patients were to identify three things that trigger them.  Patients were to also identify the physical symptoms they experience when anxious, the thoughts they experience when anxious and the coping skills they use to deal with anxiety.  Education: Communication, Discharge Planning  Education Outcome: Acknowledges understanding/In group clarification offered/Needs additional education.   Clinical Observations/Feedback: Pt identified her triggers to anxiety as anger, fighting and relationships.  Pt expressed her physical symptoms to anxiety are hurting feet, heart aches, hands ache and arthritis.  Pt identified thoughts to anxiety were "tired of being tired", loneliness and legs cramping.  Pt expressed coping skills were walking away, shut up and let the other person speak.    Victorino Sparrow, LRT/CTRS    Victorino Sparrow A 05/23/2019 11:23 AM

## 2019-05-23 NOTE — [facility] Counselor (Signed)
Patient asked if the CSW can fax a letter to her attorney for court.   CSW faxed a letter over to the patient's attorney.     Ardelle Anton, MSW, [facility] Phone: [phone] Fax: [phone]

## 2019-05-24 MED ORDER — LORAZEPAM 2 MG/ML IJ SOLN
INTRAMUSCULAR | Status: AC
  Filled 2019-05-24: qty 2

## 2019-05-24 MED ORDER — LORAZEPAM 2 MG/ML IJ SOLN
4.0000 mg | Freq: Once | INTRAMUSCULAR | Status: AC
  Administered 2019-05-24: 4 mg via INTRAMUSCULAR

## 2019-05-24 NOTE — Progress Notes (Signed)
   05/24/19 0100  Psych Admission Type (Psych Patients Only)  Admission Status Voluntary  Psychosocial Assessment  Patient Complaints None  Eye Contact Fair  Facial Expression Animated  Affect Anxious  Speech Tangential  Interaction Assertive  Motor Activity Fidgety  Appearance/Hygiene Unremarkable  Behavior Characteristics Cooperative  Mood Labile  Thought Process  Coherency Tangential  Content Delusions;Preoccupation  Delusions Referential  Perception Derealization  Hallucination None reported or observed  Judgment Poor  Confusion Mild  Danger to Self  Current suicidal ideation? Denies  Danger to Others  Danger to Others None reported or observed   Pt been appropriate on the unit this evening

## 2019-05-24 NOTE — Progress Notes (Signed)
Recreation Therapy Notes  Date: 11.17.20 Time: 0950 Location: 500 Hall Dayroom  Group Topic: Self-Esteem  Goal Area(s) Addresses:  Patient will successfully identify positive attributes about themselves.  Patient will successfully identify benefit of improved self-esteem.   Behavioral Response: Engaged  Intervention: Visual merchandiser, scissors, glue sticks, magazines  Activity: Collage About Me.  Patients were to create a collage that highlights their positive qualities or describes them.  Patients could use words or just pictures that tell about them.  Education:  Self-Esteem, Dentist.   Education Outcome: Acknowledges education/In group clarification offered/Needs additional education  Clinical Observations/Feedback: Pt was active and able to focus on activity.  Pt highlighted a lot of things from nature.  Pt expressed a like for cross country skiing, exotic birds and going for walks in the woods in the [place].  Pt also expressed a joy for making gingerbread cookies for Christmas and going to the [place] because "it's all natural".    Victorino Sparrow, LRT/CTRS     Victorino Sparrow A 05/24/2019 11:29 AM

## 2019-05-24 NOTE — Plan of Care (Signed)
Walks into another patient's doorway while I am interviewing a new patient screaming for discharge using profanity demanding we "hurt her" so she can sue [place] cannot be deescalated verbally we will order lorazepam IM

## 2019-05-24 NOTE — Progress Notes (Signed)
DAR NOTE: Patient presents with anxious affect and irritable mood.  Denies suicidal thoughts, pain, auditory and visual hallucinations.  Observed pacing the hallway knocking other patient doors and asking them to call her family members.  Patient became angry and agitated when redirected.  Became loud, yelling and pointing her finger at the staff.  Telling staff to shoot her.  Ativan 4 mg IM given per MD with good effect.  Rates depression at 0, hopelessness at 0, and anxiety at 0.  Maintained on routine safety checks.  Medications given as prescribed.  Support and encouragement offered as needed.  Attended group and participated.  States goal for today is "being with my family."  Patient is safe on the unit.

## 2019-05-24 NOTE — Progress Notes (Signed)
Adult Psychoeducational Group Note  Date:  05/24/2019 Time:  1:30 AM  Group Topic/Focus:  Wrap-Up Group:   The focus of this group is to help patients review their daily goal of treatment and discuss progress on daily workbooks.  Participation Level:  Active  Participation Quality:  Appropriate  Affect:  Appropriate  Cognitive:  Alert  Insight: Appropriate  Engagement in Group:  Engaged  Modes of Intervention:  Discussion  Additional Comments:  Patient stated having a great day. Patient's goal for today was to catch up with her family. Patient met her goal.   Angie Piercey L Aviannah Castoro 05/24/2019, 1:30 AM

## 2019-05-24 NOTE — Progress Notes (Signed)
[facility] MD Progress Note  05/24/2019 9:26 AM Allison Bolton  MRN:  [id number] Subjective:    Patient is insistent upon discharge however I spoke with her uncle/caretaker who states that she is still very delusional, so he spoke with her 10 minutes prior to my phone call with him.  He states she still claiming individuals as her children that are not simply related to her.  The patient herself is covering the symptoms denies auditory and visual hallucinations denies thoughts of harming self or others.  Caretaker would like her to be delusion free if possible prior to discharge because she tends to act on these impulses/delusions if they are not resolved Principal Problem: Bipolar I disorder, current or most recent episode manic, with psychotic features ([place]) Diagnosis: Principal Problem:   Bipolar I disorder, current or most recent episode manic, with psychotic features ([place]) Active Problems:   Delusional disorder ([place])   Elevated TSH  Total Time spent with patient: 20 minutes  Past Psychiatric History: See eval  Past Medical History:  Past Medical History:  Diagnosis Date  . Arthritis   . Asthma   . Bipolar 1 disorder ([place])   . Diverticulosis   . Dysrhythmia    sts "I have heart palpitations"  . Fibromyalgia   . H/O hiatal hernia    4  . Headache(784.0)   . Meniere's disease   . S/P colonoscopy    Dr. Laural Golden 2010: few small diverticula at sigmoid. otherwise normal.   . Shortness of breath     Past Surgical History:  Procedure Laterality Date  . ABDOMINAL HYSTERECTOMY    . APPENDECTOMY    . CESAREAN SECTION    . CHOLECYSTECTOMY    . exploratory laparoscopy    . FOOT SURGERY    . HEMORRHOID SURGERY    . LAPAROSCOPIC APPENDECTOMY  02/19/2011   Procedure: APPENDECTOMY LAPAROSCOPIC;  Surgeon: Jamesetta So;  Location: [facility];  Service: General;  Laterality: N/A;  . LAPAROSCOPY  02/19/2011   Procedure: LAPAROSCOPY DIAGNOSTIC;  Surgeon: Jamesetta So;  Location: [facility];   Service: General;  Laterality: N/A;  . left ovarian removal    . multiple hernia repairs    . Right ovarian removal    . TONSILLECTOMY    . TONSILLECTOMY AND ADENOIDECTOMY    . tubes in ears    . UMBILICAL HERNIA REPAIR  Dec 2011   Dr. Arnoldo Morale  . wisdom teeth removal     Family History:  Family History  Problem Relation Age of Onset  . Thyroid disease Mother   . Colon cancer Other        paternal and maternal grandfather  . Meniere's disease Other   . Colon cancer Father   . Colon cancer Brother   . Anesthesia problems Neg Hx   . Hypotension Neg Hx   . Malignant hyperthermia Neg Hx   . Pseudochol deficiency Neg Hx    Family Psychiatric  History: See eval Social History:  Social History   Substance and Sexual Activity  Alcohol Use No   Comment: not since June 2012     Social History   Substance and Sexual Activity  Drug Use No   Comment: patient denies any-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    Social History   Socioeconomic History  . Marital status: Legally Separated    Spouse name: Not on file  . Number of children: Not on file  . Years of education: Not on  file  . Highest education level: Not on file  Occupational History  . Not on file  Social Needs  . Financial resource strain: Not on file  . Food insecurity    Worry: Not on file    Inability: Not on file  . Transportation needs    Medical: Not on file    Non-medical: Not on file  Tobacco Use  . Smoking status: Former Smoker    Packs/day: 0.50    Years: 20.00    Pack years: 10.00    Types: Cigarettes    Start date: 07/07/1981    Quit date: 11/05/2018    Years since quitting: 0.5  . Smokeless tobacco: Never Used  Substance and Sexual Activity  . Alcohol use: No    Comment: not since June 2012  . 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 Sexual activity: Never    Birth control/protection: Surgical  Lifestyle  . Physical activity    Days per  week: Not on file    Minutes per session: Not on file  . Stress: Not on file  Relationships  . Social Herbalist on phone: Not on file    Gets together: Not on file    Attends religious service: Not on file    Active member of club or organization: Not on file    Attends meetings of clubs or organizations: Not on file    Relationship status: Not on file  Other Topics Concern  . Not on file  Social History Narrative  . Not on file   Additional Social History:                         Sleep: Fair  Appetite:  Fair  Current Medications: Current Facility-Administered Medications  Medication Dose Route Frequency Provider Last Rate Last Dose  . acetaminophen (TYLENOL) tablet 650 mg  650 mg Oral Q6H PRN Deloria Lair, NP   650 mg at 05/23/19 2119  . alum & mag hydroxide-simeth (MAALOX/MYLANTA) 200-200-20 MG/5ML suspension 30 mL  30 mL Oral Q4H PRN Lindell Spar I, NP   30 mL at 05/13/19 2317  . benztropine (COGENTIN) tablet 1 mg  1 mg Oral BID Johnn Hai, MD   1 mg at 05/24/19 [id number]  . carvedilol (COREG) tablet 25 mg  25 mg Oral BID WC Johnn Hai, MD   25 mg at 05/24/19 0807  . divalproex (DEPAKOTE) DR tablet 750 mg  750 mg Oral QPM Sharma Covert, MD   750 mg at 05/23/19 1758  . gabapentin (NEURONTIN) capsule 300 mg  300 mg Oral TID Hampton Abbot, MD   300 mg at 05/24/19 [id number]  . haloperidol (HALDOL) tablet 10 mg  10 mg Oral TID Johnn Hai, MD   10 mg at 05/24/19 0807  . LORazepam (ATIVAN) tablet 2 mg  2 mg Oral Q4H PRN Johnn Hai, MD   2 mg at 05/19/19 2111   Or  . LORazepam (ATIVAN) injection 2 mg  2 mg Intramuscular Q4H PRN Johnn Hai, MD      . magnesium hydroxide (MILK OF MAGNESIA) suspension 30 mL  30 mL Oral Daily PRN Anike, Adaku C, NP   30 mL at 05/12/19 0526  . naproxen (NAPROSYN) tablet 500 mg  500 mg Oral BID Mordecai Maes, NP   500 mg at 05/24/19 0807  . nicotine polacrilex (NICORETTE) gum 2  mg  2 mg Oral PRN Derrill Center, NP      .  pantoprazole (PROTONIX) EC tablet 40 mg  40 mg Oral Daily Mordecai Maes, NP   40 mg at 05/24/19 0807  . QUEtiapine (SEROQUEL) tablet 400 mg  400 mg Oral QHS Hampton Abbot, MD   400 mg at 05/23/19 2119  . traZODone (DESYREL) tablet 200 mg  200 mg Oral QHS Johnn Hai, MD   200 mg at 05/23/19 2119    Lab Results: No results found for this or any previous visit (from the past 34 hour(s)).  Blood Alcohol level:  Lab Results  Component Value Date   ETH <10 05/07/2019   ETH <10 [id number]    Metabolic Disorder Labs: Lab Results  Component Value Date   HGBA1C 5.3 05/11/2019   MPG 105.41 05/11/2019   MPG 114 05/13/2013   Lab Results  Component Value Date   PROLACTIN 24.1 (H) 05/11/2019   Lab Results  Component Value Date   CHOL 139 05/11/2019   TRIG 66 05/11/2019   HDL 44 05/11/2019   CHOLHDL 3.2 05/11/2019   VLDL 13 05/11/2019   LDLCALC 82 05/11/2019   LDLCALC 88 05/13/2013    Physical Findings: AIMS: Facial and Oral Movements Muscles of Facial Expression: None, normal Lips and Perioral Area: None, normal Jaw: None, normal Tongue: None, normal,Extremity Movements Upper (arms, wrists, hands, fingers): None, normal Lower (legs, knees, ankles, toes): None, normal, Trunk Movements Neck, shoulders, hips: None, normal, Overall Severity Severity of abnormal movements (highest score from questions above): None, normal Incapacitation due to abnormal movements: None, normal Patient's awareness of abnormal movements (rate only patient's report): No Awareness, Dental Status Current problems with teeth and/or dentures?: No Does patient usually wear dentures?: No  CIWA:  CIWA-Ar Total: 1 COWS:  COWS Total Score: 1  Musculoskeletal: Strength & Muscle Tone: within normal limits Gait & Station: normal Patient leans: N/A  Psychiatric Specialty Exam: Physical Exam  ROS  Blood pressure 116/67, pulse 78, temperature 97.8 F (36.6 C), temperature source Oral, resp. rate 16,  height 5\' 6"  (1.676 m), weight 86.6 kg, SpO2 97 %.Body mass index is 30.83 kg/m.  General Appearance: Casual  Eye Contact:  Good  Speech:  Normal Rate  Volume:  Normal  Mood:  Euthymic  Affect:  Appropriate and Congruent  Thought Process:  Linear and Descriptions of Associations: Intact  Orientation:  Full (Time, Place, and Person)  Thought Content:  Delusions  Suicidal Thoughts:  No  Homicidal Thoughts:  No  Memory:  Immediate;   Poor Recent;   Fair Remote;   Fair  Judgement:  Good-improved  Insight:  Good-improved  Psychomotor Activity:  Normal  Concentration:  Concentration: Fair and Attention Span: Fair  Recall:  [employer] of Knowledge:  Fair  Language:  Fair  Akathisia:  Negative  Handed:  Right  AIMS (if indicated):     Assets:  Resilience  ADL's:  Intact  Cognition:  WNL  Sleep:  Number of Hours: 6.25     Treatment Plan Summary: Medication management  Continue current precautions and reality based therapy  Patient's caretaker does not believe she is well enough to come home because she might act on her delusional believes therefore we will continue high-dose Haldol, combination antipsychotic therapy with quetiapine no change in precautions continue reality based work  Johnn Hai, MD 05/24/2019, 9:26 AM

## 2019-05-24 NOTE — Progress Notes (Signed)
   05/24/19 2000  Psych Admission Type (Psych Patients Only)  Admission Status Voluntary  Psychosocial Assessment  Patient Complaints Anxiety  Eye Contact Fair  Facial Expression Animated  Affect Anxious  Speech Tangential  Interaction Assertive  Motor Activity Fidgety  Appearance/Hygiene Unremarkable  Behavior Characteristics Cooperative  Mood Labile  Thought Process  Coherency Tangential  Content Delusions;Preoccupation  Delusions Referential  Perception Derealization  Hallucination None reported or observed  Judgment Poor  Confusion Mild  Danger to Self  Current suicidal ideation? Denies  Danger to Others  Danger to Others None reported or observed   Pt visible on the unit

## 2019-05-25 NOTE — Progress Notes (Signed)
   05/25/19 2300  Psych Admission Type (Psych Patients Only)  Admission Status Voluntary  Psychosocial Assessment  Patient Complaints Anxiety  Eye Contact Fair  Facial Expression Animated  Affect Anxious  Speech Tangential  Interaction Assertive  Motor Activity Fidgety  Appearance/Hygiene Unremarkable  Behavior Characteristics Cooperative  Mood Labile  Thought Process  Coherency Tangential  Content Delusions;Preoccupation  Delusions Referential  Perception Derealization  Hallucination None reported or observed  Judgment Poor  Confusion Mild  Danger to Self  Current suicidal ideation? Denies  Danger to Others  Danger to Others None reported or observed

## 2019-05-25 NOTE — Progress Notes (Signed)
Recreation Therapy Notes  Date: 11.18.20 Time: 1000 Location: 500 Hall Dayroom  Group Topic: Communication, Team Building, Problem Solving  Goal Area(s) Addresses:  Patient will effectively work with peer towards shared goal.  Patient will identify skills used to make activity successful.  Patient will identify how skills used during activity can be used to reach post d/c goals.   Behavioral Response: Engaged  Intervention: STEM Activity  Activity: Straw Bridge.  In groups, patients were given 15 plastic straws and 72ft of masking tape.  Patients were to build an elevated bridge that can stand on it's own and hold a paperback book.  Education: Education officer, community, Discharge Planning   Education Outcome: Acknowledges education/In group clarification offered/Needs additional education.   Clinical Observations/Feedback: Pt worked well with peer and was able to focus throughout activity.  Pt was pleasant and expressed the group had to use motor skills to complete activity.  Pt appeared to use good teamwork skills and communication skills to complete activity.     Victorino Sparrow, LRT/CTRS    Victorino Sparrow A 05/25/2019 11:58 AM

## 2019-05-25 NOTE — Progress Notes (Signed)
Patient denies SI, HI and AVH this shift.  Patient has been compliant with medications.  Patient has had no issues with behavioral dyscontrol.  Assess patient for safety, offer medications as prescribed, engage patient in 1:1 staff talks.   Continue to monitor as planned.  Patient able to contract for safety.

## 2019-05-25 NOTE — Progress Notes (Signed)
[facility] MD Progress Note  05/25/2019 11:35 AM Allison Bolton  MRN:  [id number] Subjective:   After yesterday's outburst around 120 continue to have a very uneventful evening, by 420 was knocking on other patients doors instructing them to phone her family members was unable to be deescalated and once again became yelling threatening to staff to some degree time them she to "shoot her" and required once again IM medications.   Thus far today she is behaviorally contained her mood is still little bit hypomanic her speech is rambling but she denies hallucinations no threats are made and she is working hard to lobby for discharge so she is behaving generally but still not well and truly symptomatic but covering well. Principal Problem: Bipolar I disorder, current or most recent episode manic, with psychotic features ([place]) Diagnosis: Principal Problem:   Bipolar I disorder, current or most recent episode manic, with psychotic features ([place]) Active Problems:   Delusional disorder ([place])   Elevated TSH  Total Time spent with patient: 20 minutes  Past Psychiatric History: Prior episodes  Past Medical History:  Past Medical History:  Diagnosis Date  . Arthritis   . Asthma   . Bipolar 1 disorder ([place])   . Diverticulosis   . Dysrhythmia    sts "I have heart palpitations"  . Fibromyalgia   . H/O hiatal hernia    4  . Headache(784.0)   . Meniere's disease   . S/P colonoscopy    Dr. Laural Golden 2010: few small diverticula at sigmoid. otherwise normal.   . Shortness of breath     Past Surgical History:  Procedure Laterality Date  . ABDOMINAL HYSTERECTOMY    . APPENDECTOMY    . CESAREAN SECTION    . CHOLECYSTECTOMY    . exploratory laparoscopy    . FOOT SURGERY    . HEMORRHOID SURGERY    . LAPAROSCOPIC APPENDECTOMY  02/19/2011   Procedure: APPENDECTOMY LAPAROSCOPIC;  Surgeon: Jamesetta So;  Location: [facility];  Service: General;  Laterality: N/A;  . LAPAROSCOPY  02/19/2011   Procedure:  LAPAROSCOPY DIAGNOSTIC;  Surgeon: Jamesetta So;  Location: [facility];  Service: General;  Laterality: N/A;  . left ovarian removal    . multiple hernia repairs    . Right ovarian removal    . TONSILLECTOMY    . TONSILLECTOMY AND ADENOIDECTOMY    . tubes in ears    . UMBILICAL HERNIA REPAIR  Dec 2011   Dr. Arnoldo Morale  . wisdom teeth removal     Family History:  Family History  Problem Relation Age of Onset  . Thyroid disease Mother   . Colon cancer Other        paternal and maternal grandfather  . Meniere's disease Other   . Colon cancer Father   . Colon cancer Brother   . Anesthesia problems Neg Hx   . Hypotension Neg Hx   . Malignant hyperthermia Neg Hx   . Pseudochol deficiency Neg Hx    Family Psychiatric  History: No new data Social History:  Social History   Substance and Sexual Activity  Alcohol Use No   Comment: not since June 2012     Social History   Substance and Sexual Activity  Drug Use No   Comment: patient denies any-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    Social History   Socioeconomic History  . Marital status: Legally Separated    Spouse name: Not on file  . Number of children:  Not on file  . Years of education: Not on file  . Highest education level: Not on file  Occupational History  . Not on file  Social Needs  . Financial resource strain: Not on file  . Food insecurity    Worry: Not on file    Inability: Not on file  . Transportation needs    Medical: Not on file    Non-medical: Not on file  Tobacco Use  . Smoking status: Former Smoker    Packs/day: 0.50    Years: 20.00    Pack years: 10.00    Types: Cigarettes    Start date: 07/07/1981    Quit date: 11/05/2018    Years since quitting: 0.5  . Smokeless tobacco: Never Used  Substance and Sexual Activity  . Alcohol use: No    Comment: not since June 2012  . 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 Sexual activity: Never    Birth  control/protection: Surgical  Lifestyle  . Physical activity    Days per week: Not on file    Minutes per session: Not on file  . Stress: Not on file  Relationships  . Social Herbalist on phone: Not on file    Gets together: Not on file    Attends religious service: Not on file    Active member of club or organization: Not on file    Attends meetings of clubs or organizations: Not on file    Relationship status: Not on file  Other Topics Concern  . Not on file  Social History Narrative  . Not on file   Additional Social History:                         Sleep: Fair  Appetite:  Fair  Current Medications: Current Facility-Administered Medications  Medication Dose Route Frequency Provider Last Rate Last Dose  . acetaminophen (TYLENOL) tablet 650 mg  650 mg Oral Q6H PRN Deloria Lair, NP   650 mg at 05/23/19 2119  . alum & mag hydroxide-simeth (MAALOX/MYLANTA) 200-200-20 MG/5ML suspension 30 mL  30 mL Oral Q4H PRN Lindell Spar I, NP   30 mL at 05/24/19 1142  . benztropine (COGENTIN) tablet 1 mg  1 mg Oral BID Johnn Hai, MD   1 mg at 05/25/19 0810  . carvedilol (COREG) tablet 25 mg  25 mg Oral BID WC Johnn Hai, MD   25 mg at 05/25/19 0810  . divalproex (DEPAKOTE) DR tablet 750 mg  750 mg Oral QPM Sharma Covert, MD   750 mg at 05/24/19 1702  . gabapentin (NEURONTIN) capsule 300 mg  300 mg Oral TID Hampton Abbot, MD   300 mg at 05/25/19 0810  . haloperidol (HALDOL) tablet 10 mg  10 mg Oral TID Johnn Hai, MD   10 mg at 05/25/19 0810  . LORazepam (ATIVAN) tablet 2 mg  2 mg Oral Q4H PRN Johnn Hai, MD   2 mg at 05/19/19 2111   Or  . LORazepam (ATIVAN) injection 2 mg  2 mg Intramuscular Q4H PRN Johnn Hai, MD      . magnesium hydroxide (MILK OF MAGNESIA) suspension 30 mL  30 mL Oral Daily PRN Anike, Adaku C, NP   30 mL at 05/12/19 0526  . naproxen (NAPROSYN) tablet 500 mg  500 mg Oral BID Mordecai Maes, NP   500 mg  at 05/25/19 0810  .  nicotine polacrilex (NICORETTE) gum 2 mg  2 mg Oral PRN Derrill Center, NP      . pantoprazole (PROTONIX) EC tablet 40 mg  40 mg Oral Daily Mordecai Maes, NP   40 mg at 05/25/19 0810  . QUEtiapine (SEROQUEL) tablet 400 mg  400 mg Oral QHS Hampton Abbot, MD   400 mg at 05/24/19 2108  . traZODone (DESYREL) tablet 200 mg  200 mg Oral QHS Johnn Hai, MD   200 mg at 05/24/19 2108    Lab Results: No results found for this or any previous visit (from the past 3 hour(s)).  Blood Alcohol level:  Lab Results  Component Value Date   ETH <10 05/07/2019   ETH <10 [id number]    Metabolic Disorder Labs: Lab Results  Component Value Date   HGBA1C 5.3 05/11/2019   MPG 105.41 05/11/2019   MPG 114 05/13/2013   Lab Results  Component Value Date   PROLACTIN 24.1 (H) 05/11/2019   Lab Results  Component Value Date   CHOL 139 05/11/2019   TRIG 66 05/11/2019   HDL 44 05/11/2019   CHOLHDL 3.2 05/11/2019   VLDL 13 05/11/2019   LDLCALC 82 05/11/2019   LDLCALC 88 05/13/2013    Physical Findings: AIMS: Facial and Oral Movements Muscles of Facial Expression: None, normal Lips and Perioral Area: None, normal Jaw: None, normal Tongue: None, normal,Extremity Movements Upper (arms, wrists, hands, fingers): None, normal Lower (legs, knees, ankles, toes): None, normal, Trunk Movements Neck, shoulders, hips: None, normal, Overall Severity Severity of abnormal movements (highest score from questions above): None, normal Incapacitation due to abnormal movements: None, normal Patient's awareness of abnormal movements (rate only patient's report): No Awareness, Dental Status Current problems with teeth and/or dentures?: No Does patient usually wear dentures?: No  CIWA:  CIWA-Ar Total: 1 COWS:  COWS Total Score: 1  Musculoskeletal: Strength & Muscle Tone: within normal limits Gait & Station: normal Patient leans: N/A  Psychiatric Specialty Exam: Physical Exam  ROS  Blood pressure 105/72,  pulse 87, temperature 97.7 F (36.5 C), temperature source Oral, resp. rate 20, height 5\' 6"  (1.676 m), weight 86.6 kg, SpO2 97 %.Body mass index is 30.83 kg/m.  General Appearance: Casual  Eye Contact:  Fair  Speech:  Normal Rate  Volume:  Increased  Mood:  Hypomanic and prone to self agitation  Affect:  Congruent  Thought Process:  Descriptions of Associations: Circumstantial  Orientation:  Full (Time, Place, and Person)  Thought Content:  Tangential  Suicidal Thoughts:  No  Homicidal Thoughts:  No  Memory:  Immediate;   Poor Recent;   Fair Remote;   Fair  Judgement:  Impaired  Insight:  Shallow  Psychomotor Activity:  Increased  Concentration:  Concentration: Good  Recall:  [place] of Knowledge:  Fair  Language:  Fair  Akathisia:  Negative  Handed:  Right  AIMS (if indicated):     Assets:  Physical Health Resilience  ADL's:  Intact  Cognition:  WNL  Sleep:  Number of Hours: 7.25     Treatment Plan Summary: Daily contact with patient to assess and evaluate symptoms and progress in treatment and Medication management Continue but escalate antipsychotic therapy continue mood stabilizer therapy continue reality based therapy continue to redirect when needed monitor for safety for her and other patients as well as staff no change in precautions now Johnn Hai, MD 05/25/2019, 11:35 AM

## 2019-05-26 MED ORDER — HALOPERIDOL 10 MG PO TABS: ORAL_TABLET | ORAL | 2 refills | Status: DC

## 2019-05-26 MED ORDER — BENZTROPINE MESYLATE 1 MG PO TABS: 1.0000 mg | ORAL_TABLET | Freq: Two times a day (BID) | ORAL | 2 refills | Status: DC

## 2019-05-26 MED ORDER — DIVALPROEX SODIUM 250 MG PO DR TAB: 750.0000 mg | DELAYED_RELEASE_TABLET | Freq: Every evening | ORAL | 2 refills | Status: DC

## 2019-05-26 MED ORDER — CARVEDILOL 25 MG PO TABS: 25.0000 mg | ORAL_TABLET | Freq: Two times a day (BID) | ORAL | 3 refills | Status: DC

## 2019-05-26 MED ORDER — TRAZODONE HCL 100 MG PO TABS: 200.0000 mg | ORAL_TABLET | Freq: Every evening | ORAL | 2 refills | Status: DC | PRN

## 2019-05-26 NOTE — [facility] Suicide Risk Assessment (Signed)
[facility] Discharge Suicide Risk Assessment   Principal Problem: Bipolar I disorder, current or most recent episode manic, with psychotic features [facility]) Discharge Diagnoses: Principal Problem:   Bipolar I disorder, current or most recent episode manic, with psychotic features ([place]) Active Problems:   Delusional disorder ([place])   Elevated TSH   Total Time spent with patient: 45 minutes Musculoskeletal: Strength & Muscle Tone: within normal limits Gait & Station: normal Patient leans: N/A  Psychiatric Specialty Exam: Physical Exam  ROS  Blood pressure (!) 148/135, pulse 75, temperature 98.1 F (36.7 C), temperature source Oral, resp. rate 20, height 5\' 6"  (1.676 m), weight 86.6 kg, SpO2 97 %.Body mass index is 30.83 kg/m.  General Appearance: Casual  Eye Contact:  Good  Speech:  Pressured  Volume:  Normal  Mood:  Euthymic  Affect:  Congruent  Thought Process:  Goal Directed and Descriptions of Associations: Circumstantial  Orientation:  Full (Time, Place, and Person)  Thought Content:  Logical  Suicidal Thoughts:  No  Homicidal Thoughts:  No  Memory:  Immediate;   Fair Recent;   Fair Remote;   Fair  Judgement:  Fair  Insight:  Fair  Psychomotor Activity:  Normal  Concentration:  Concentration: Fair and Attention Span: Fair  Recall:  [employer] of Knowledge:  Fair  Language:  Fair  Akathisia:  Negative  Handed:  Right  AIMS (if indicated):     Assets:  Communication Skills Desire for Improvement  ADL's:  Intact  Cognition:  WNL  Sleep:  Number of Hours: 7.5  Mental Status Per Nursing Assessment::   On Admission:  NA  Demographic Factors:  Unemployed  Loss Factors: NA  Historical Factors: NA  Risk Reduction Factors:   Positive social support and Positive therapeutic relationship  Continued Clinical Symptoms:  Previous Psychiatric Diagnoses and Treatments  Cognitive Features That Contribute To Risk:  Polarized thinking    Suicide Risk:  Minimal: No  identifiable suicidal ideation.  Patients presenting with no risk factors but with morbid ruminations; may be classified as minimal risk based on the severity of the depressive symptoms  Follow-up Information    Medicine, [facility] Internal Follow up on 05/26/2019.   Specialty: Internal Medicine Why: You have an appointment with Dr. Sherrie Sport, MD on 11/19 @ 1:45pm in the office. Please bring your hospital discharge paperwork with you.  Contact information: [place] [phone] [phone]        Services, [facility] Follow up.   Why:   Contact information: [place] [address] [phone]           Plan Of Care/Follow-up recommendations:  Activity:  full  Daunte Oestreich, MD 05/26/2019, 9:45 AM

## 2019-05-26 NOTE — Progress Notes (Signed)
Recreation Therapy Notes  INPATIENT RECREATION TR PLAN  Patient Details Name: Allison Bolton MRN: [id number] DOB: 12-13-1970 Today's Date: 05/26/2019  Rec Therapy Plan Is patient appropriate for Therapeutic Recreation?: Yes Treatment times per week: about 3 days Estimated Length of Stay: 5-7 days TR Treatment/Interventions: Group participation (Comment)  Discharge Criteria Pt will be discharged from therapy if:: Discharged Treatment plan/goals/alternatives discussed and agreed upon by:: Patient/family  Discharge Summary Short term goals set: See patient care plan Short term goals met: Complete Progress toward goals comments: Groups attended Which groups?: Self-esteem, Coping skills, Wellness, Leisure education, Communication, Other (Comment)(Triggers, Anxiety, Team building) Reason goals not met: None Therapeutic equipment acquired: N/A Reason patient discharged from therapy: Discharge from hospital Pt/family agrees with progress & goals achieved: Yes Date patient discharged from therapy: 05/26/19     [place] Sparrow, LRT/CTRS  Ria Comment, [place] 05/26/2019, 12:06 PM

## 2019-05-26 NOTE — Progress Notes (Signed)
Patient ID: Allison Bolton, female   DOB: October 31, 1970, 48 y.o.   MRN: [id number] Patient discharged to home/self care on her own accord.  Patient denies SI, HI and AVH upon discharge.  Patient acknowledged understanding of all discharge instructions and receipt of personal belongings.
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
## 2019-05-26 NOTE — Progress Notes (Signed)
  [facility] Adult Case Management Discharge Plan :  Will you be returning to the same living situation after discharge:  Yes,  home. At discharge, do you have transportation home?: Yes,  Lyft at 10:30am Do you have the ability to pay for your medications: Yes,  [facility] Medicare.  Release of information consent forms completed and in the chart. Letter on chart.  Patient to Follow up at: Follow-up Information    Medicine, [facility] Internal Follow up on 05/26/2019.   Specialty: Internal Medicine Why: You have an appointment with Dr. Sherrie Sport, MD on 11/19 @ 1:45pm in the office. Please bring your hospital discharge paperwork with you.  Contact information: [place] [phone] [id number]        [facility], [facility] Follow up on 05/30/2019.   Why: Please follow up with [facility] on Monday, November 23rd at 10:00am.  Contact information: [address] [place] [address] [phone]           Next level of care provider has access to [place] and Suicide Prevention discussed: Yes,  with friend [name].  Have you used any form of tobacco in the last 30 days? (Cigarettes, Smokeless Tobacco, Cigars, and/or Pipes): No  Has patient been referred to the Quitline?: N/A patient is not a smoker  Patient has been referred for addiction treatment: Yes  Joellen Jersey, [place] 05/26/2019, 10:20 AM

## 2019-05-26 NOTE — Plan of Care (Signed)
Pt was able to demonstrate no more than 2 impulsive behaviors at completion of recreation therapy group sessions.  Allison Bolton, LRT/CTRS

## 2019-05-26 NOTE — Progress Notes (Signed)
Recreation Therapy Notes  Date: 11.19.20 Time: 0945 Location: [address] Dayroom  Group Topic: Leisure Education  Goal Area(s) Addresses:  Patient will identify positive leisure activities.  Patient will identify one positive benefit of participation in leisure activities.   Behavioral Response: Engaged  Intervention: Leisure Group Game  Activity: Leisure Freight forwarder.  LRT and patients played a game of pictionary.  One person would get a word from the container and draw it on the board, the person that guesses the picture would get the next turn.  Each person had one minute.  Education:  Leisure Education, Dentist  Education Outcome: Acknowledges education/In group clarification offered/Needs additional education  Clinical Observations/Feedback: Pt was bright and active.  Pt was social with peers.  Pt was able to focus on the activity as well.  Pt had to be redirected to not talk and give away the answer when it was her turn to draw.     Victorino Sparrow, LRT/CTRS     Victorino Sparrow A 05/26/2019 10:43 AM

## 2019-06-12 ENCOUNTER — Emergency Department (HOSPITAL_COMMUNITY)
Admission: EM | Admit: 2019-06-12 | Discharge: 2019-06-12 | Disposition: A | Discharge status: Home / Self Care | Payer: Medicare Other | Attending: Emergency Medicine | Admitting: Emergency Medicine

## 2019-06-12 ENCOUNTER — Encounter (HOSPITAL_COMMUNITY): Payer: Self-pay | Admitting: Emergency Medicine

## 2019-06-12 ENCOUNTER — Emergency Department (HOSPITAL_COMMUNITY): Payer: Medicare Other

## 2019-06-12 ENCOUNTER — Other Ambulatory Visit: Payer: Self-pay

## 2019-06-12 DIAGNOSIS — R079 Chest pain, unspecified: Secondary | ICD-10-CM | POA: Insufficient documentation

## 2019-06-12 DIAGNOSIS — Z5321 Procedure and treatment not carried out due to patient leaving prior to being seen by health care provider: Secondary | ICD-10-CM | POA: Diagnosis not present

## 2019-06-12 LAB — TROPONIN I (HIGH SENSITIVITY): Troponin I (High Sensitivity): <2 ng/L (ref <18)

## 2019-06-12 LAB — BASIC METABOLIC PANEL
Anion gap: 8 (ref 5–15)
BUN: 12 mg/dL (ref 6–20)
CO2: 25 mmol/L (ref 22–32)
Calcium: 9 mg/dL (ref 8.9–10.3)
Chloride: 106 mmol/L (ref 98–111)
Creatinine, Ser: 0.65 mg/dL (ref 0.44–1.00)
GFR calc Af Amer: >60 mL/min (ref >60)
GFR calc non Af Amer: >60 mL/min (ref >60)
Glucose, Bld: 94 mg/dL (ref 70–99)
Potassium: 3.4 mmol/L — ABNORMAL LOW (ref 3.5–5.1)
Sodium: 139 mmol/L (ref 135–145)

## 2019-06-12 LAB — CBC
HCT: 44.6 % (ref 36.0–46.0)
Hemoglobin: 14.4 g/dL (ref 12.0–15.0)
MCH: 28.6 pg (ref 26.0–34.0)
MCHC: 32.3 g/dL (ref 30.0–36.0)
MCV: 88.7 fL (ref 80.0–100.0)
Platelets: 244 10*3/uL (ref 150–400)
RBC: 5.03 MIL/uL (ref 3.87–5.11)
RDW: 13 % (ref 11.5–15.5)
WBC: 10.6 10*3/uL — ABNORMAL HIGH (ref 4.0–10.5)
nRBC: 0 % (ref 0.0–0.2)

## 2019-06-12 MED ORDER — SODIUM CHLORIDE 0.9% FLUSH: 3.0000 mL | Freq: Once | INTRAVENOUS | Status: DC

## 2019-06-12 NOTE — ED Triage Notes (Signed)
Patient c/o mid-sternal, non-radiating chest pain that started today. Per patient nausea, dizziness, and weakness. Denies any shortness of breath. Per patient hx of MI and stroke.

## 2019-07-04 ENCOUNTER — Emergency Department (HOSPITAL_COMMUNITY)
Admission: EM | Admit: 2019-07-04 | Discharge: 2019-07-04 | Disposition: A | Discharge status: Home / Self Care | Payer: Medicare Other

## 2019-07-04 ENCOUNTER — Other Ambulatory Visit: Payer: Self-pay

## 2019-09-28 DIAGNOSIS — F312 Bipolar disorder, current episode manic severe with psychotic features: Secondary | ICD-10-CM | POA: Diagnosis not present

## 2019-10-10 DIAGNOSIS — J45909 Unspecified asthma, uncomplicated: Secondary | ICD-10-CM | POA: Diagnosis not present

## 2019-10-10 DIAGNOSIS — Z0000 Encounter for general adult medical examination without abnormal findings: Secondary | ICD-10-CM | POA: Diagnosis not present

## 2019-10-10 DIAGNOSIS — Z1331 Encounter for screening for depression: Secondary | ICD-10-CM | POA: Diagnosis not present

## 2019-10-10 DIAGNOSIS — K219 Gastro-esophageal reflux disease without esophagitis: Secondary | ICD-10-CM | POA: Diagnosis not present

## 2019-10-10 DIAGNOSIS — Z131 Encounter for screening for diabetes mellitus: Secondary | ICD-10-CM | POA: Diagnosis not present

## 2019-10-10 DIAGNOSIS — F3131 Bipolar disorder, current episode depressed, mild: Secondary | ICD-10-CM | POA: Diagnosis not present

## 2019-10-10 DIAGNOSIS — Z6832 Body mass index (BMI) 32.0-32.9, adult: Secondary | ICD-10-CM | POA: Diagnosis not present

## 2019-12-19 DIAGNOSIS — N760 Acute vaginitis: Secondary | ICD-10-CM | POA: Diagnosis not present

## 2019-12-19 DIAGNOSIS — Z90722 Acquired absence of ovaries, bilateral: Secondary | ICD-10-CM | POA: Diagnosis not present

## 2019-12-19 DIAGNOSIS — B9689 Other specified bacterial agents as the cause of diseases classified elsewhere: Secondary | ICD-10-CM | POA: Diagnosis not present

## 2019-12-19 DIAGNOSIS — N898 Other specified noninflammatory disorders of vagina: Secondary | ICD-10-CM | POA: Diagnosis not present

## 2019-12-19 DIAGNOSIS — Z90710 Acquired absence of both cervix and uterus: Secondary | ICD-10-CM | POA: Diagnosis not present

## 2019-12-19 DIAGNOSIS — R21 Rash and other nonspecific skin eruption: Secondary | ICD-10-CM | POA: Diagnosis not present

## 2020-01-03 DIAGNOSIS — F312 Bipolar disorder, current episode manic severe with psychotic features: Secondary | ICD-10-CM | POA: Diagnosis not present

## 2020-01-12 IMAGING — DX DG CHEST 2V
2 series · 2 of 2 positions shown · non-contrast
Comparison: 02/16/2019

CLINICAL DATA: Chest pain

EXAM:
CHEST - 2 VIEW

[chest pa]
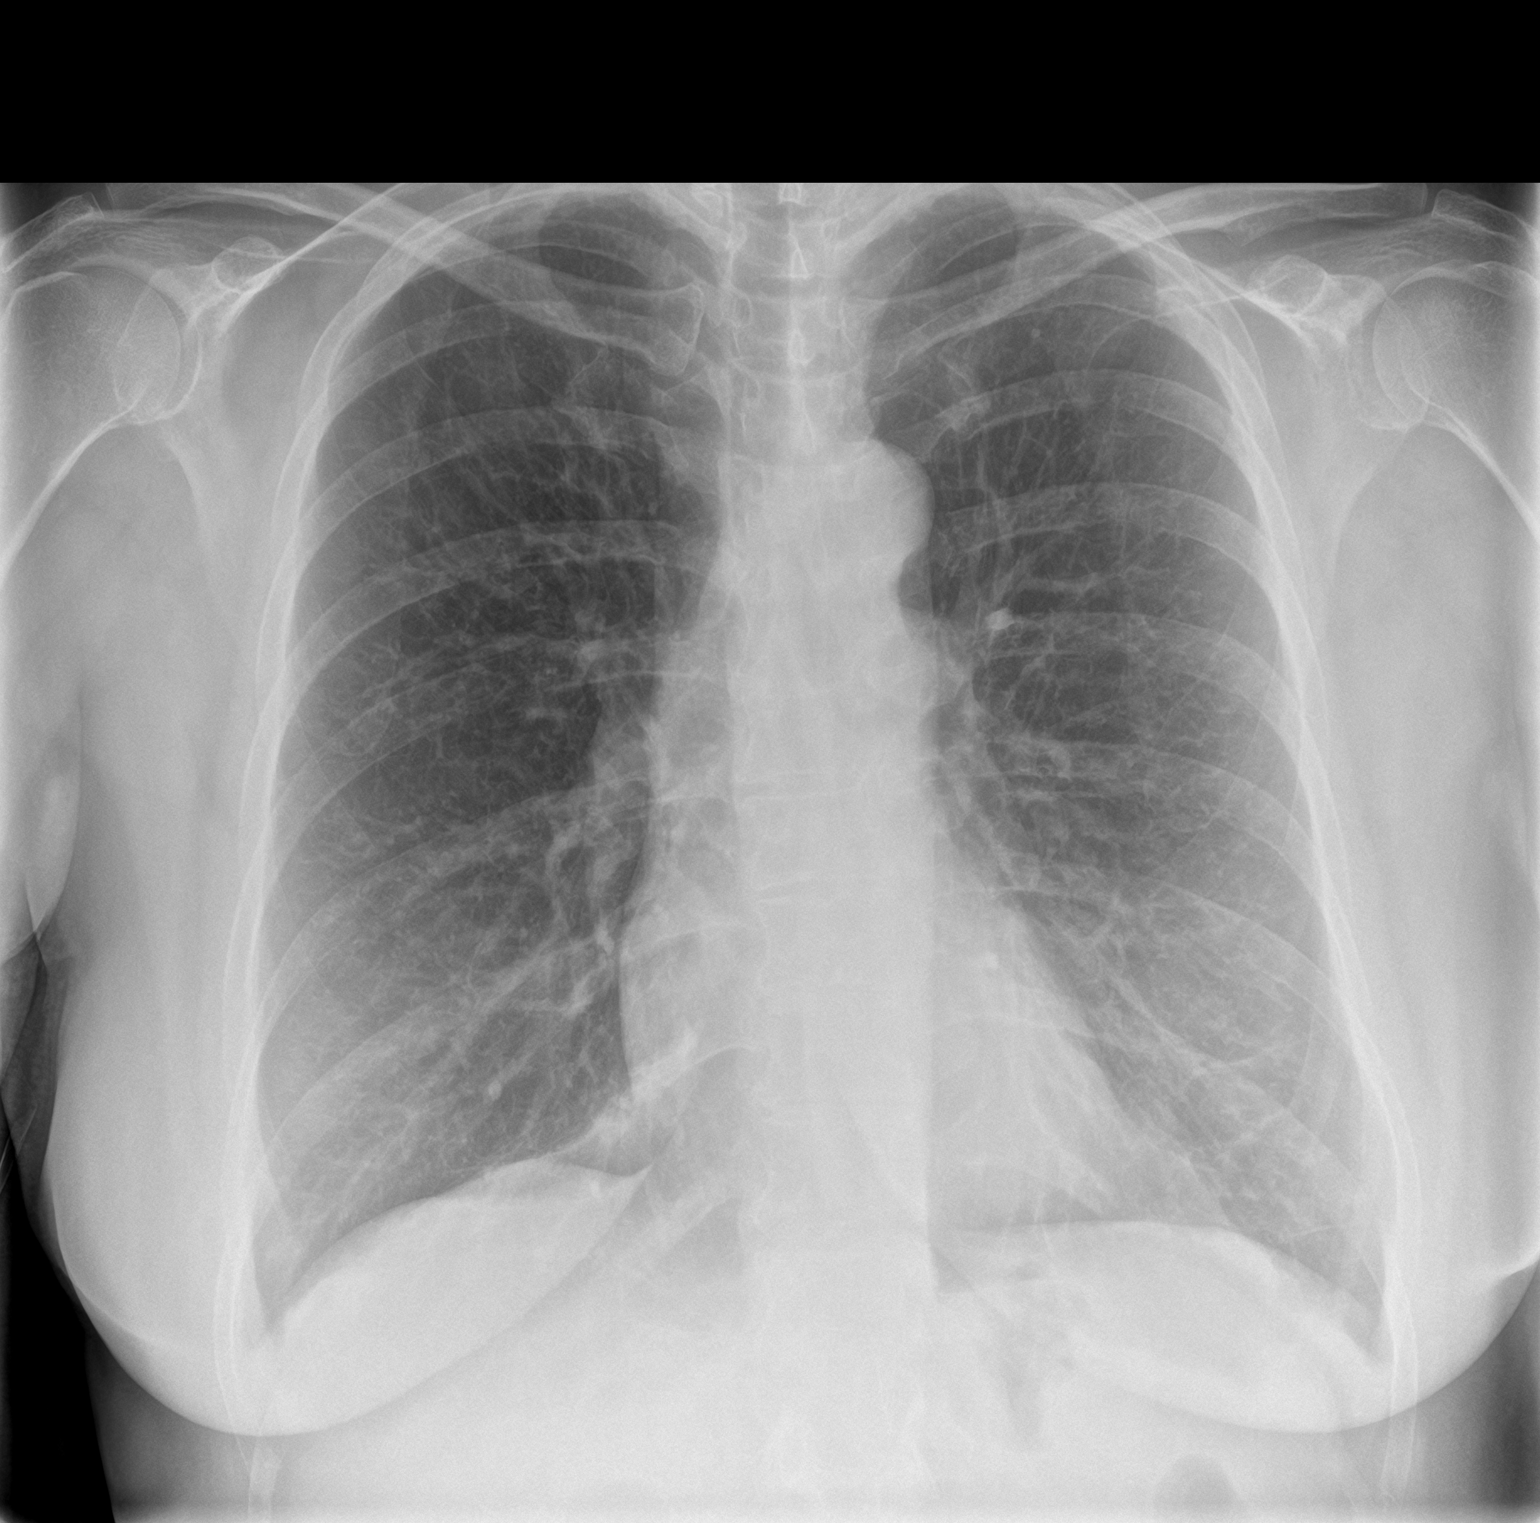

[chest lat]
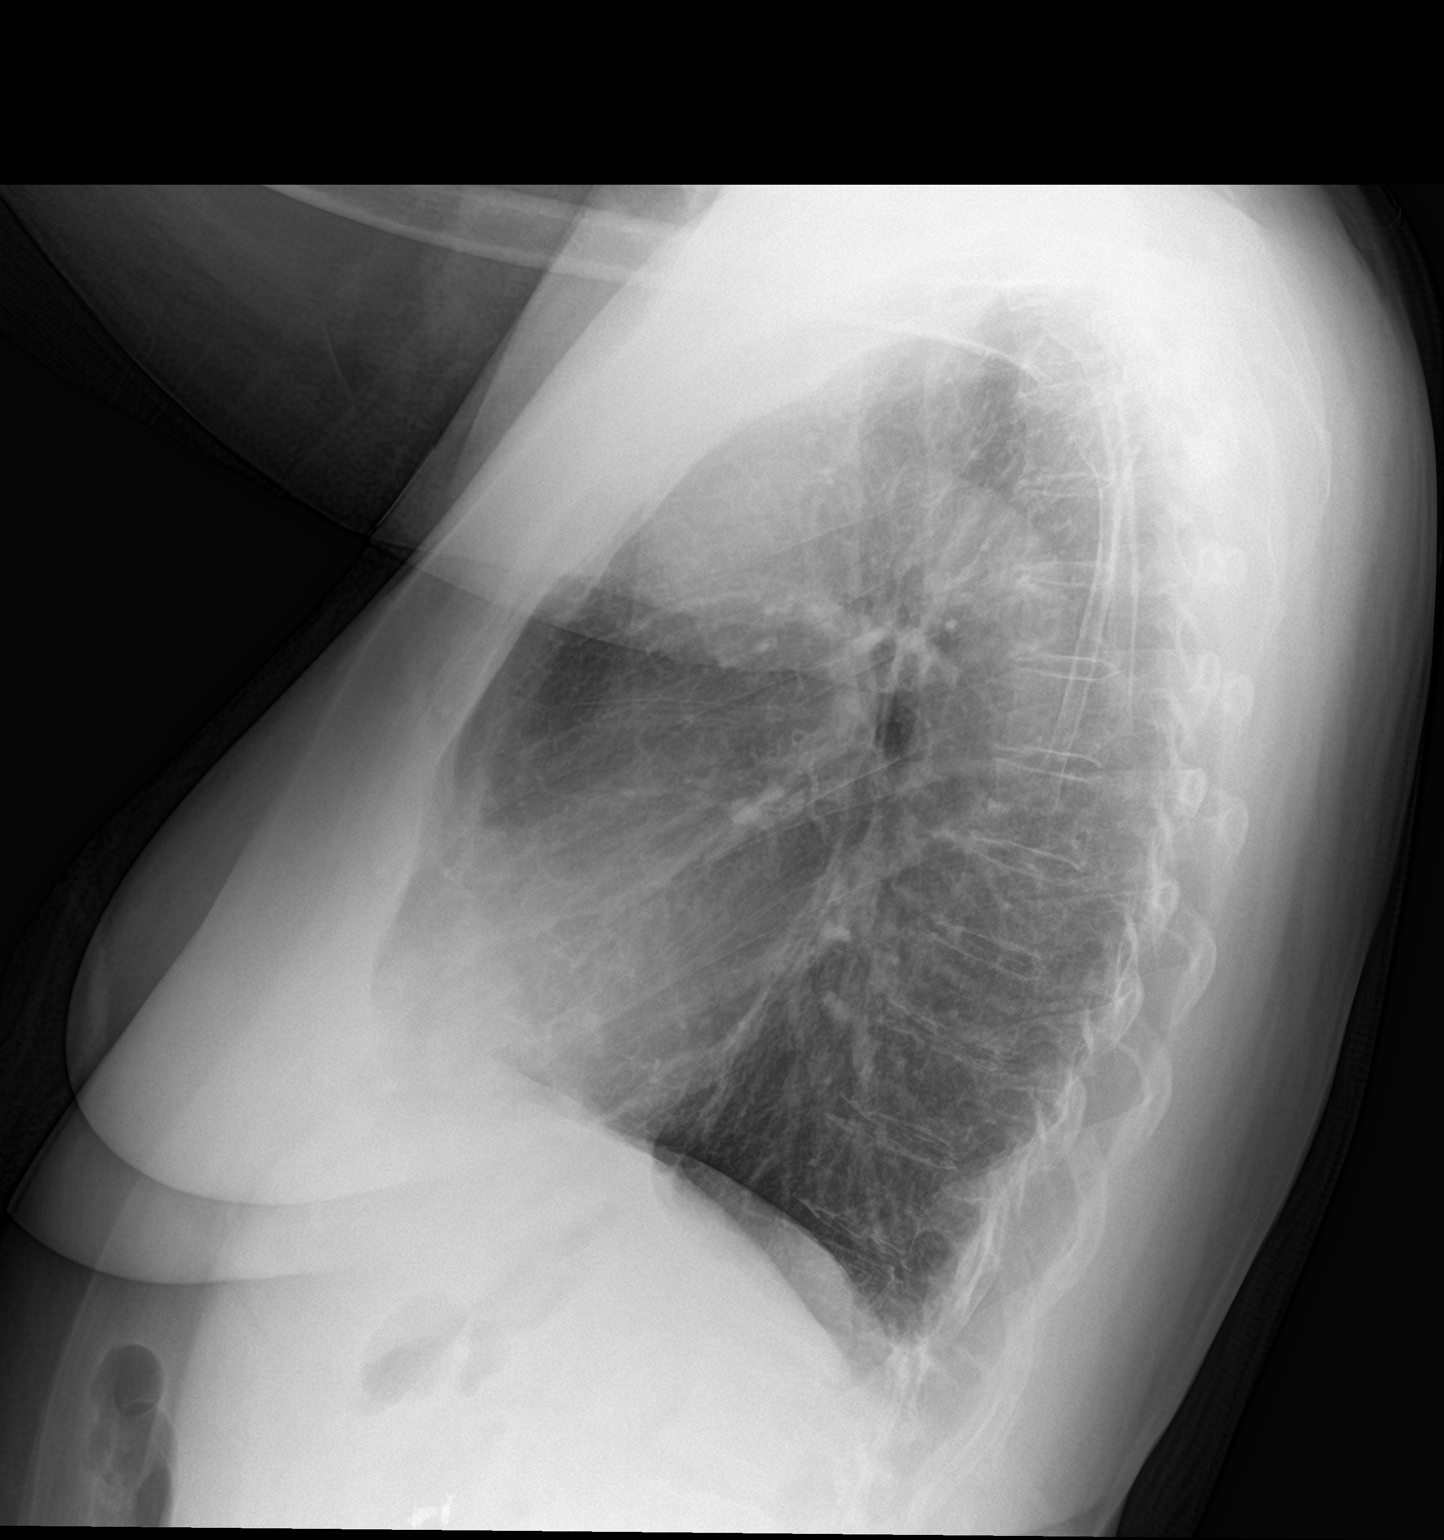

[2 of 2 positions shown; findings below may reference images not displayed]

FINDINGS: Normal heart size, mediastinal contours, and pulmonary vascularity.

Mild chronic peribronchial thickening.

No pulmonary infiltrate, pleural effusion or pneumothorax.

No acute osseous findings.
IMPRESSION: Chronic bronchitic changes without acute infiltrate.

## 2020-03-27 DIAGNOSIS — F312 Bipolar disorder, current episode manic severe with psychotic features: Secondary | ICD-10-CM | POA: Diagnosis not present

## 2020-04-02 DIAGNOSIS — Z6831 Body mass index (BMI) 31.0-31.9, adult: Secondary | ICD-10-CM | POA: Diagnosis not present

## 2020-04-02 DIAGNOSIS — G4700 Insomnia, unspecified: Secondary | ICD-10-CM | POA: Diagnosis not present

## 2020-04-02 DIAGNOSIS — Z1331 Encounter for screening for depression: Secondary | ICD-10-CM | POA: Diagnosis not present

## 2020-04-02 DIAGNOSIS — Z0000 Encounter for general adult medical examination without abnormal findings: Secondary | ICD-10-CM | POA: Diagnosis not present

## 2020-04-02 DIAGNOSIS — E7849 Other hyperlipidemia: Secondary | ICD-10-CM | POA: Diagnosis not present

## 2020-04-02 DIAGNOSIS — F3131 Bipolar disorder, current episode depressed, mild: Secondary | ICD-10-CM | POA: Diagnosis not present

## 2020-04-02 DIAGNOSIS — J45909 Unspecified asthma, uncomplicated: Secondary | ICD-10-CM | POA: Diagnosis not present

## 2020-04-02 DIAGNOSIS — Z131 Encounter for screening for diabetes mellitus: Secondary | ICD-10-CM | POA: Diagnosis not present

## 2020-04-02 DIAGNOSIS — K219 Gastro-esophageal reflux disease without esophagitis: Secondary | ICD-10-CM | POA: Diagnosis not present

## 2020-04-09 ENCOUNTER — Encounter (INDEPENDENT_AMBULATORY_CARE_PROVIDER_SITE_OTHER): Payer: Self-pay

## 2022-10-27 NOTE — ED Provider Notes (Addendum)
 I have seen examined and discussed the patient with the initial ED resident/APP caring for the patient and I have reviewed/agree with the documentation provided in the ED Provider Note(s) any exceptions are noted below:  Time Spent in Critical Care of the patient: [facility] ED CC TIME YES NO:31715}  Patient to be admitted:  {Admit Yes or No:31709}  Jacques Inches, MD
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
## 2022-11-26 NOTE — ED Triage Notes (Addendum)
 Pt BIBA for SOB. Pt endorsing difficulty breathing. Pt appears to in no respiratory distress, protecting airway. VSS, AOx4.

## 2022-12-16 NOTE — ED Notes (Signed)
 1126 - food tray ordered   Elizabeth Flies, RN 12/16/22 1127

## 2022-12-16 NOTE — Case Communication (Signed)
 Care Management Social Work Note  CSW consulted to provide shelter resources.   CSW met with pt at bedside. Pt reports she usually stays on the streets but knows where shelters are. Pt reports she typically stays alone and does not stay with any particular group of people. CSW discussed shelter resource list with pt. Resource list provided to pt.   No other needs identified at this time.
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
## 2022-12-16 NOTE — ED Notes (Signed)
 SW to bedside and provided with resources. Pt provided with d/c instructions. Pt verbalized understanding and return precautions. Pt being prepped for d/c at this time.   Elizabeth Flies, RN 12/16/22 1302

## 2023-01-15 NOTE — Discharge Summary (Signed)
 Patient Education Table of Contents Ankle Sprain Ankle Sprain Ankle Sprain, Phase I Rehab Ankle Sprain, Phase II Rehab  To view videos and all your education online visit, [email or web address] or scan this QR code with your smartphone. Access to this content will expire in one year. Ankle Sprain  An ankle sprain is a stretch or tear in a ligament in the ankle. Ligaments are tissues that connect bones to each other. The two most common types of ankle sprains are: Inversion sprain. This happens when the foot turns inward and the ankle rolls outward. It affects the ligament on the outside of the foot (lateral ligament). Eversion sprain. This happens when the foot turns outward and the ankle rolls inward. It affects the ligament on the inner side of the foot (medial ligament). What are the causes? An ankle sprain is often caused by rolling or twisting the ankle by accident. What increases the risk? You are more likely to get an ankle sprain if you play sports. What are the signs or symptoms?  Symptoms of an ankle sprain include: Pain in your ankle. Swelling. Bruising. Bruises may form right after you sprain your ankle or 1-2 days later. Trouble standing or walking. This includes trouble turning or changing directions. How is this diagnosed? An ankle sprain is diagnosed with a physical exam. Your health care provider will press on parts of your foot and ankle and try to move them in certain ways. You may also have X-rays taken. These may be done to see how severe the sprain is and to check for broken bones. How is this treated? An ankle sprain may be treated with: A brace or splint. This is used to keep the ankle from moving until it heals. An elastic bandage (dressing). This is used to support the ankle. Crutches. Pain medicine. Surgery. This may be needed if the sprain is severe. Physical therapy. This may help to improve the range of motion in the ankle. Follow these  instructions at home: If you have a removable brace or a splint: Wear the brace or splint as told by your provider. Remove it only as told by your provider. Check the skin around the brace or splint every day. Tell your provider about any concerns. Loosen the brace or splint if your toes tingle, become numb, or turn cold and blue. Keep the brace or splint clean. If the brace or splint is not waterproof: Do not let it get wet.  Cover it with a watertight covering when you take a bath or a shower. If you have an elastic dressing: Take the dressing off to shower or bathe. If the dressing feels too tight, adjust it to make it more comfortable. Loosen the dressing if your foot tingles, becomes numb, or turns cold and blue. Managing pain, stiffness, and swelling If told, put ice on the affected area. If you have a removable brace or splint, remove it as told by your provider. Put ice in a plastic bag. Place a towel between your skin and the bag. Leave the ice on for 20 minutes, 2-3 times a day. Remove the ice if your skin turns bright red. This is very important. If you cannot feel pain, heat, or cold, you have a greater risk of damage to the area. If your skin turns bright red, remove the ice right away to prevent skin damage. The risk of damage is higher if you cannot feel pain, heat, or cold. Move your toes often to reduce stiffness and swelling. For  2-3 days, raise (elevate) your ankle above the level of your heart while you are sitting or lying down. General instructions Take over-the-counter and prescription medicines only as told by your provider. Do not use any products that contain nicotine  or tobacco. These products include cigarettes, chewing tobacco, and vaping devices, such as e-cigarettes. If you need help quitting, ask your provider. Rest your ankle. Do not use your ankle to support your body weight until your provider says that you can. Use crutches as told by your provider. Ask  your provider when it is safe to drive if you have a brace or splint on your ankle. Contact a health care provider if: You have bruising or swelling that get worse all of a sudden. Your pain does not get better with medicine. Get help right away if: Your foot or toes become numb or blue. You have severe pain that gets worse. This information is not intended to replace advice given to you by your health care provider. Make sure you discuss any questions you have with your health care provider. Document Released: 2005-06-23 Document Updated: 2022-03-26 Document Reviewed: 2022-03-26 [employer] Patient Education  2024 [employer] Ankle Sprain  An ankle sprain is a stretch or tear in a ligament in your ankle. Ligaments are tissues that connect bones to each other.  An ankle sprain can happen when: The ankle rolls outward. This is called an inversion sprain. The ankle rolls inward. This is called an eversion sprain. What are the causes? An ankle sprain is caused by rolling or twisting your ankle. What increases the risk? You are more likely to get an ankle sprain if you play sports. What are the signs or symptoms?  Pain in your ankle. Swelling. Bruising. Bruises may form right after you sprain your ankle or 1-2 days later. Trouble standing or walking. How is this treated? An ankle sprain may be treated with: A brace or splint. This is used to keep the ankle from moving until it heals. An elastic bandage (dressing). This is used to support the ankle. Crutches. Pain medicine. Surgery. This may be needed if the sprain is very bad. Physical therapy. This can help you move your ankle better. Follow these instructions at home: If you have a brace or a splint that can be taken off: Wear the brace or splint as told by your doctor. Take it off only as told by your doctor. Check the skin around the brace or splint every day. Tell your doctor if you see problems. Loosen the brace or splint if  your toes: Tingle.  Become numb. Turn cold and blue. Keep the brace or splint clean and dry. If the brace or splint is not waterproof: Do not let it get wet. Cover it with a watertight covering when you take a bath or a shower. If you have an elastic dressing: Take it off to shower or bathe. Adjust it if it feels too tight. Loosen the dressing if your foot: Tingles. Becomes numb. Turns cold and blue. Managing pain, stiffness, and swelling If told, put ice on the affected area. If you have a removable brace or splint, take it off as told by your doctor. Put ice in a plastic bag. Place a towel between your skin and the bag. Leave the ice on for 20 minutes, 2-3 times a day. If your skin turns bright red, take off the ice right away to prevent skin damage. The risk of damage is higher if you cannot feel pain, heat,  or cold. Move your toes often. Raise your ankle above the level of your heart while you are sitting or lying down. General instructions Take over-the-counter and prescription medicines only as told by your doctor. Do not smoke or use any products that contain nicotine  or tobacco. If you need help quitting, ask your doctor. Rest your ankle. Use crutches to support your body weight. Do not use your injured leg to support your body weight until your doctor says that you can. Ask your doctor when it is safe to drive if you have a brace or splint on your ankle. Contact a doctor if: Your bruises or swelling get worse all of a sudden. Your pain does not get better after you take medicine. Get help right away if: Your foot or toes are numb or blue. You have very bad pain that gets worse. This information is not intended to replace advice given to you by your health care provider. Make sure you discuss any questions you have with your health care provider. Document Released: 2007-12-10 Document Updated: 2022-03-26 Document Reviewed: 2022-03-26 [employer] Patient Education  2024  [employer] Ankle Sprain, Phase I Rehab An ankle sprain is an injury to the tissues that connect bone to bone (ligaments) in your ankle. Ankle sprains can cause stiffness, loss of motion, and loss of strength. Ask your health care provider which exercises are safe for you. Do exercises exactly as told by your provider and adjust them as directed. It is normal to feel mild stretching, pulling, tightness, or discomfort as you do these exercises. Stop right away if you feel sudden pain or your pain gets worse. Do not begin these exercises until told by your provider. Stretching and range-of-motion exercises These exercises warm up your muscles and joints. They can improve the movement and flexibility of your lower leg and ankle. They also help to relieve pain and stiffness. Gastroc and soleus stretch  This exercise is also called a calf stretch. It stretches the muscles in the back of the lower leg. These muscles are the gastrocnemius, or gastroc, and the soleus. Sit on the floor with your left / right leg extended. Loop a belt or towel around the ball of your left / right foot. The ball of your foot is on the walking surface, right under your toes. Keep your left / right ankle and foot relaxed and keep your knee straight. Use the belt or towel to pull your foot toward you. You should feel a gentle stretch behind your calf or knee in your gastroc muscle. Hold this position for __________ seconds, then release to the starting position. Repeat the exercise with your knee bent. You can put a pillow or a rolled bath towel under your knee to support it. You should feel a stretch deep in your calf in the soleus muscle or at your Achilles tendon. Repeat __________ times. Complete this exercise __________ times a day. Ankle alphabet  Sit with your left / right leg supported at the lower leg. Do not rest your foot on anything. Make sure your foot has room to move freely. Think of your left / right foot as a  paintbrush. Move your foot to trace each letter of the alphabet in the air. Keep your hip and knee still while you trace.  Make the letters as large as you can without feeling discomfort. Trace every letter from A to Z. Repeat __________ times. Complete this exercise __________ times a day. Strengthening exercises These exercises build strength and  endurance in your ankle and lower leg. Endurance is the ability to use your muscles for a long time, even after they get tired. Ankle dorsiflexion  Secure a rubber exercise band or tube to an object, such as a table leg, that will stay still when the band is pulled. Secure the other end around your left / right foot. Sit on the floor facing the object, with your left / right leg extended. The band or tube should be slightly tense when your foot is relaxed. Slowly bring your foot toward you, bringing the top of your foot toward your shin (dorsiflexion), and pulling the band tighter. Hold this position for __________ seconds. Slowly return your foot to the starting position. Repeat __________ times. Complete this exercise __________ times a day. Ankle plantar flexion  Sit on the floor with your left / right leg extended. Loop a rubber exercise tube or band around the ball of your left / right foot. The ball of your foot is on the walking surface, right under your toes. Hold the ends of the band or tube in your hands. The band or tube should be slightly tense when your foot is relaxed. Slowly point your foot and toes downward to tilt the top of your foot away from your shin (plantar flexion). Hold this position for __________ seconds. Slowly return your foot to the starting position. Repeat __________ times. Complete this exercise __________ times a day. Ankle eversion  Sit on the floor with your legs straight out in front of you. Loop a rubber exercise band or tube around the ball of your left / right foot. The ball of your foot is on the walking  surface, right under your toes. Hold the ends of the band in your hands or secure the band to a stable object. The band or tube should be slightly tense when your foot is relaxed. Slowly push your foot outward, away from your other leg (eversion). Hold this position for __________ seconds. Slowly return your foot to the starting position. Repeat __________ times. Complete this exercise __________ times a day. This information is not intended to replace advice given to you by your health care provider. Make sure you discuss any questions you have with your health care provider. Document Released: 2005-01-22 Document Updated: 2022-04-16 Document Reviewed: 2022-04-16 [employer] Patient Education  2024 [employer] Ankle Sprain, Phase II Rehab An ankle sprain is an injury to tissue that connects bone to bone (a ligament) in the ankle. Ankle sprains can cause stiffness, loss of motion, and loss of strength. Ask your health care provider which exercises are safe for you. Do exercises exactly as told by your provider and adjust them as directed. It is normal to feel mild stretching, pulling, tightness, or discomfort as you do these exercises. Stop right away if you feel sudden pain or your pain gets worse. Do not begin these exercises until told by your provider. Stretching and range-of-motion exercises These exercises warm up your muscles and joints. They can improve the movement and flexibility of your lower leg and ankle. They also help to relieve pain and stiffness. Standing gastroc stretch  This exercise is also called a standing calf (gastroc) stretch. Stand with your hands against a wall. Extend your left / right leg behind you. Bend your front knee slightly. Your heels should be on the floor. Keeping your heels on the floor and your back knee straight, shift your weight toward the wall. You should feel a gentle stretch in the  back of your lower leg (calf). Hold this position for __________  seconds. Repeat __________ times. Complete this exercise __________ times a day. Standing soleus stretch  This exercise is also called a standing calf (soleus) stretch. Stand with your hands against a wall. Extend your left / right leg behind you. Bend your front knee slightly. Both of your heels should be on the floor. Keeping your heels on the floor, bend your back knee and shift your weight slightly over your back leg. You should feel a gentle stretch deep in your calf. Hold this position for __________ seconds. Repeat __________ times. Complete this exercise __________ times a day. Strengthening exercises These exercises build strength and endurance in your lower leg. Endurance is the ability to use your muscles for a long time, even after they get tired. Heel walking  This exercise is sometimes called dorsiflexion. Walk on your heels for __________ seconds or ___________ steps. Keep your toes as high as possible. Repeat __________ times. Complete this exercise __________ times a day. Balance exercises These exercises improve your balance and the reaction and control of your ankle. They help to improve stability. Multi-angle lunge  Stand with your feet together. Take a step forward with your left / right leg. Shift your weight onto that leg. Your back heel will come off the floor and your back toes will stay in place. Push off your front leg to return your front foot to the starting position next to your other foot. Repeat to the side, to the back, and any other directions as told by your provider. Repeat __________ times. Complete this exercise __________ times a day. Single leg stand  If this exercise is too easy, you can try it with your eyes closed or while standing on a pillow. Without shoes, stand near a railing or in a doorframe. Hold on to the railing or doorframe as needed. Let loose of the railing or doorframe as you are able. Stand on your left / right foot. Keep your big  toe down on the floor and try to keep your arch lifted. Hold this position for __________ seconds. Repeat __________ times. Complete this exercise __________ times a day. Ankle inversion and eversion  This exercise is also called foot rotation with a balance board. It uses a balance board to rotate the foot and ankle inward (inversion) and outward (eversion). Ask your provider where you can get a balance board or how you can make one. Stand on a non-carpeted surface near a countertop or wall. Step onto the balance board so your feet are hip width apart. Keep your feet in place. Keep your upper body and hips steady. Using only your feet and ankles to move the board, do these exercises as told by your provider: Tip the board side to side as far as you can, switching between tipping to the left and tipping to the right. Tip the board so it silently taps the floor. Do not let the board hit the floor with force. From time to time, pause to hold a steady midway position, with neither the right nor the left sides touching the ground. Tip the board side to side so it does not hit the floor at all. From time to time, pause to hold a steady midway position. Repeat __________ times. Complete this exercise __________ times a day. Ankle plantar flexion and dorsiflexion  This exercise is also called foot flexion with a balance board. It uses a balance board to push the foot downward  and away from the leg (plantar flexion) or upward and toward the leg (dorsiflexion). Ask your provider where you can get a balance board or how you can make one. Stand on a non-carpeted surface near a countertop or wall. Step onto the balance board so your feet are hip width apart. Keep your feet in place. Keep your upper body and hips steady. Using only your feet and ankles to move the board, do one or both of these exercises as told by your provider: Tip the board forward and backward so it silently taps the floor. Do not let  the board hit the floor with force. From time to time, pause to hold a steady position midway between touching the floor in front and touching the floor in back. Tip the board forward and backward so it does not hit the floor at all. From time to time, pause to hold a steady position in the middle. Repeat __________ times. Complete this exercise __________ times a day. This information is not intended to replace advice given to you by your health care provider. Make sure you discuss any questions you have with your health care provider. Document Released: 2005-10-13 Document Updated: 2022-04-16 Document Reviewed: 2022-04-16 [employer] Patient Education  2024 [employer]

## 2023-01-15 NOTE — ED Provider Notes (Signed)
 EMERGENCY DEPARTMENT ENCOUNTER    BRIEF ENCOUNTER SUMMARY    CLINICAL IMPRESSION:  1. Sprain of right ankle, initial encounter        COMPLAINT / HISTORY OF PRESENT ILLNESS     HPI:   52 year old female who notes history of diabetes type 2 and hypertension taking her medications as prescribed presents for right ankle injury.  Patient states earlier today she tripped inverting her right ankle.  She states she did not fall to the ground and has no other injuries from this fall.  She notes pain with walking however she is ambulated several miles since the injury.  Patient was seen at another facility approximately 30 minutes ago and discharged however patient is concerned that she had not have x-rays performed in presents by EMS to our facility specifically requesting x-rays of her right ankle.SABRA     PAST HISTORIES / MEDICATIONS / ALLERGIES  History reviewed. No pertinent past medical history.   History reviewed. No pertinent surgical history.   No family history on file.   Social History   Tobacco Use  . Smoking status: Never  . Smokeless tobacco: Never  Substance Use Topics  . Alcohol use: Not Currently  . Drug use: Never     Current Outpatient Medications  Medication Instructions  . acetaminophen  (TYLENOL ) 500 mg, Oral, Every 6 hours PRN      Allergies  Allergen Reactions  . Amoxicillin Rash  . Tramadol Rash      ROS: A complete 10-point REVIEW OF SYSTEMS was performed and is negative with the exception of those items previously documented in the HPI and nursing notes. As is my standard practice, ALL positives from the ROS are documented in the HPI.   PHYSICAL EXAMINATION  Right lower extremity exam: Skin:  Mild swelling with tenderness to palpation over the lateral ankle.  Else no swelling or signs of trauma No rashes.  Joints:  Hip:  Pelvis is stable. No TTP over the hip. No femoral shaft tenderness.  Knee: No TTP over the patella, quadriceps tendon or patellar tendon.  Extensor function of the quadriceps intact at the knee. No TTP over the medial or lateral aspects of the knee joint.  No TTP over the anterior joint line. No effusion.  No posterior knee tenderness, masses or swelling.  Intact ligamentous function (equal to contralateral side) of the ACL (Lachman test), PCL (Posterior drawer test), MCL (Valgus stress test), and LCL (Varus stress test).  No pain with McMurray test for meniscus injury. No TTP over the head of the fibula.  No leg tenderness over the tibial or fibular shafts.  Ankle: No TTP over the ankle anterior joint line. No TTP over the medial malleolus.  No TTP over the calcaneus or tarsal bones.  No TTP over the plantar fascia insertion site. No TTP over the metatarsal bones x 5. No TTP over MCP joint, PIP or DIP joint in any toe. No TTP over the metatarsal bones x 5.  No TTP over the proximal, middle or distal phalanges in any toes. Vascular: Symmetrically palpable dorsalis pedis and posterior tibial pulses (equal to contralateral side). Capillary refill <2 seconds to all distal toes. Sensation: Sensation intact over the hip.  Anterior, posterior, medial and lateral thigh sensation intact.  Medial and lateral aspects of the leg sensation intact. Intact sensation over the dorsal and plantar aspects of the foot including between toes 1 and 2.   Motor: Pt has full ROM of the hip.  Full ROM at  the knee and ankle.  Pt has the ability for flex and extend the foot normally.  General:  Poor hygiene Awake. Alert. No Acute Distress. Head/Eyes: Atraumatic. Normocephalic. PERRL. EOMI. Normal Conjunctiva. ENT: Moist mucous membranes. Normal pharynx. Neck: Supple/No meningismus. No midline tenderness. Full range of motion. No masses or swelling. Respiratory: No distress. Normal breath sounds. Breath sounds = bilat. No wheezes, rales, or rhonchi. Cardio: Heart Rate NL. Regular Rhythm. Normal Heart Sounds. Cap Refill not delayed. Peripheral Circulation NL. Pulses =  bilaterally. Abdomen: Soft. Non-Tender. No Guarding. No Rebound. No Distension. Extremities other than right lower extremity below: Normal Inspection. Non-tender. No swelling. Full range of motion. Pulses equal. Neurologic intact. Vascular intact.  Back: Normal Inspection. Full Range of Motion. No Midline Thoracic, Lumbar or Sacral Vertebral Tenderness. Skin: Normal Color. No Rashes.  Neuro: Alert. Oriented to person, place and time. Normal Speech. No Motor/Sensory Deficits.  ED Triage Vitals [01/15/23 2038]  Temp Heart Rate Resp BP  36.9 C (98.4 F) 59 17 108/69    SpO2 Temp Source Heart Rate Source Patient Position  97 % Temporal Monitor Supine    BP Location FiO2 (%) Oxygen Therapy O2 Flow Rate (L/min)  Left arm -- None (Room air) --    O2 Delivery Method     --         ED COURSE / PROCEDURES / DIAGNOSTICS   ED COURSE:    EKG:  No orders to display      Vitals:   01/15/23 2038  BP: 108/69  BP Location: Left arm  Patient Position: Supine  Pulse: 59  Resp: 17  Temp: 36.9 C (98.4 F)  TempSrc: Temporal  SpO2: 97%  Weight: 59 kg (130 lb)  Height: 1.676 m (5' 6)     .vitals  TREATMENTS ORDERED:  Medications  acetaminophen  (Tylenol ) tablet 975 mg (975 mg Oral Given 01/15/23 2118)      . The patient was reassessed after treatment and pain improved   LABS ORDERED AND REVIEWED:  Labs Reviewed  BKR POC GLUCOSE METER - Normal      Result Value   Glucose, POC 89         The laboratory results were reviewed and show normal blood sugar    IMAGING ORDERED AND REVIEWED:  XR Ankle 3+ Views Right  Final Result  1.  No acute fracture or dislocation.    THIS DOCUMENT HAS BEEN ELECTRONICALLY SIGNED:  LUDIE PRINGLE, MD  01/15/2023 21:24 MDT  Contributed By:    WORKSTATION: [id number]     The imaging results were reviewed and show no acute fracture    Diagnosis as of 01/15/23 2133  Sprain of right ankle, initial encounter          PROCEDURES:  Splint  Application  Performed by: Mabel Ozell Bame, MD Authorized by: Mabel Ozell Bame, MD   Consent:    Consent obtained:  Verbal   Consent given by:  Patient   Risks, benefits, and alternatives were discussed: yes     Risks discussed:  Discoloration, numbness and pain   Alternatives discussed:  No treatment Universal protocol:    Imaging studies available: yes     Patient identity confirmed:  Verbally with patient Pre-procedure details:    Distal neurologic exam:  Normal   Distal perfusion: distal pulses strong   Procedure details:    Location:  Ankle   Ankle location:  R ankle   Strapping: yes     Cast type:  Short  leg walking   Splint type:  Ankle stirrup   Supplies:  Prefabricated splint Post-procedure details:    Distal neurologic exam:  Normal   Procedure completion:  Tolerated   Post-procedure imaging: reviewed   Comments:     Patient is unlikely to require further orthopedic intervention.  Splint is for definitive management     MDM:   Patient placed in his splint here in the emergency department counseled on home care and return precautions.  Patient ambulates well    CRITICAL CARE TIME:  any critical care time is listed in the procedure section  All pertinent current as well as previous labs and imaging studies reviewed as available.   All pervious pertinent available ED and outpatient records reviewed.  Image interpretation by myself and in agreement with radiology report unless otherwise specified.   All laboratory results interpreted independently by myself.  CONSULTATIONS (SUMMARY):  Consultants   Not on file      ASSESSMENT AND PLAN    CLINICAL IMPRESSION:  1. Sprain of right ankle, initial encounter     ED Prescriptions     Medication Sig Dispense Start Date End Date Auth. Provider   acetaminophen  (TYLENOL ) 500 MG chewable tablet Chew 1 tablet (500 mg total) every 6 (six) hours if needed for mild pain for up to 10 days. 30 tablet  01/15/2023 01/25/2023 Mabel Ozell Bame, MD           FOLLOW UP:  [facility] [address] [place]  [address] [phone] Call in 1 day           Mabel Ozell Bame, MD 01/15/23 2133

## 2023-09-27 ENCOUNTER — Emergency Department (HOSPITAL_COMMUNITY)
Admission: EM | Admit: 2023-09-27 | Discharge: 2023-09-27 | Disposition: A | Discharge status: Home / Self Care | Attending: Emergency Medicine | Admitting: Emergency Medicine

## 2023-09-27 ENCOUNTER — Encounter (HOSPITAL_COMMUNITY): Payer: Self-pay | Admitting: Emergency Medicine

## 2023-09-27 ENCOUNTER — Other Ambulatory Visit: Payer: Self-pay

## 2023-09-27 DIAGNOSIS — S40812A Abrasion of left upper arm, initial encounter: Secondary | ICD-10-CM

## 2023-09-27 DIAGNOSIS — S50812A Abrasion of left forearm, initial encounter: Secondary | ICD-10-CM | POA: Insufficient documentation

## 2023-09-27 DIAGNOSIS — S59912A Unspecified injury of left forearm, initial encounter: Secondary | ICD-10-CM | POA: Diagnosis present

## 2023-09-27 DIAGNOSIS — R519 Headache, unspecified: Secondary | ICD-10-CM | POA: Diagnosis not present

## 2023-09-27 NOTE — Discharge Instructions (Addendum)
 Keep the abrasions clean with mild soap and water.  You may keep it bandaged as well.  Tylenol if needed for pain.  Follow-up with your primary care provider for recheck if needed
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
## 2023-10-07 ENCOUNTER — Encounter (INDEPENDENT_AMBULATORY_CARE_PROVIDER_SITE_OTHER): Payer: Self-pay | Admitting: *Deleted

## 2023-10-07 DIAGNOSIS — M545 Low back pain, unspecified: Secondary | ICD-10-CM | POA: Diagnosis not present

## 2023-10-07 DIAGNOSIS — S3992XA Unspecified injury of lower back, initial encounter: Secondary | ICD-10-CM | POA: Diagnosis not present

## 2023-10-07 DIAGNOSIS — M47817 Spondylosis without myelopathy or radiculopathy, lumbosacral region: Secondary | ICD-10-CM | POA: Diagnosis not present

## 2023-10-07 DIAGNOSIS — M50323 Other cervical disc degeneration at C6-C7 level: Secondary | ICD-10-CM | POA: Diagnosis not present

## 2023-10-07 DIAGNOSIS — S199XXA Unspecified injury of neck, initial encounter: Secondary | ICD-10-CM | POA: Diagnosis not present

## 2023-10-07 DIAGNOSIS — M47812 Spondylosis without myelopathy or radiculopathy, cervical region: Secondary | ICD-10-CM | POA: Diagnosis not present

## 2023-10-07 DIAGNOSIS — M542 Cervicalgia: Secondary | ICD-10-CM | POA: Diagnosis not present

## 2023-10-07 DIAGNOSIS — M4856XA Collapsed vertebra, not elsewhere classified, lumbar region, initial encounter for fracture: Secondary | ICD-10-CM | POA: Diagnosis not present

## 2023-10-20 ENCOUNTER — Encounter (INDEPENDENT_AMBULATORY_CARE_PROVIDER_SITE_OTHER): Payer: Self-pay | Admitting: *Deleted

## 2023-10-28 ENCOUNTER — Other Ambulatory Visit (HOSPITAL_COMMUNITY): Payer: Self-pay | Admitting: Internal Medicine

## 2023-10-28 DIAGNOSIS — Z1231 Encounter for screening mammogram for malignant neoplasm of breast: Secondary | ICD-10-CM

## 2023-11-03 DIAGNOSIS — G603 Idiopathic progressive neuropathy: Secondary | ICD-10-CM | POA: Diagnosis not present

## 2023-11-03 DIAGNOSIS — M5459 Other low back pain: Secondary | ICD-10-CM | POA: Diagnosis not present

## 2023-11-03 DIAGNOSIS — J309 Allergic rhinitis, unspecified: Secondary | ICD-10-CM | POA: Diagnosis not present

## 2023-11-03 DIAGNOSIS — I872 Venous insufficiency (chronic) (peripheral): Secondary | ICD-10-CM | POA: Diagnosis not present

## 2023-11-03 DIAGNOSIS — M542 Cervicalgia: Secondary | ICD-10-CM | POA: Diagnosis not present

## 2023-11-03 DIAGNOSIS — Z6825 Body mass index (BMI) 25.0-25.9, adult: Secondary | ICD-10-CM | POA: Diagnosis not present

## 2023-11-04 ENCOUNTER — Ambulatory Visit
Admission: RE | Admit: 2023-11-04 | Discharge: 2023-11-04 | Disposition: A | Discharge status: Home / Self Care | Payer: Self-pay | Source: Ambulatory Visit | Attending: Internal Medicine | Admitting: Internal Medicine

## 2023-11-04 ENCOUNTER — Encounter (HOSPITAL_COMMUNITY): Payer: Self-pay

## 2023-11-04 ENCOUNTER — Ambulatory Visit (HOSPITAL_COMMUNITY)
Admission: RE | Admit: 2023-11-04 | Discharge: 2023-11-04 | Disposition: A | Discharge status: Home / Self Care | Source: Ambulatory Visit | Attending: Internal Medicine | Admitting: Internal Medicine

## 2023-11-04 ENCOUNTER — Other Ambulatory Visit (HOSPITAL_COMMUNITY): Payer: Self-pay | Admitting: Internal Medicine

## 2023-11-04 DIAGNOSIS — Z1231 Encounter for screening mammogram for malignant neoplasm of breast: Secondary | ICD-10-CM

## 2023-11-05 ENCOUNTER — Ambulatory Visit (INDEPENDENT_AMBULATORY_CARE_PROVIDER_SITE_OTHER): Admitting: Gastroenterology

## 2023-11-05 ENCOUNTER — Encounter (INDEPENDENT_AMBULATORY_CARE_PROVIDER_SITE_OTHER): Payer: Self-pay | Admitting: Gastroenterology

## 2023-11-05 VITALS — BP 115/86 | HR 81 | Temp 98.4°F | Ht 66.0 in | Wt 159.5 lb

## 2023-11-05 DIAGNOSIS — K625 Hemorrhage of anus and rectum: Secondary | ICD-10-CM | POA: Diagnosis not present

## 2023-11-05 DIAGNOSIS — K589 Irritable bowel syndrome without diarrhea: Secondary | ICD-10-CM | POA: Insufficient documentation

## 2023-11-05 DIAGNOSIS — R109 Unspecified abdominal pain: Secondary | ICD-10-CM | POA: Diagnosis not present

## 2023-11-05 DIAGNOSIS — K581 Irritable bowel syndrome with constipation: Secondary | ICD-10-CM

## 2023-11-05 DIAGNOSIS — K5900 Constipation, unspecified: Secondary | ICD-10-CM

## 2023-11-05 MED ORDER — LINACLOTIDE 72 MCG PO CAPS: 72.0000 ug | ORAL_CAPSULE | Freq: Every day | ORAL | 3 refills | Status: DC

## 2023-11-05 NOTE — Progress Notes (Signed)
 Samantha Cress, M.D. Gastroenterology & Hepatology [facility] [address] [place], [place] [address] Primary Care Physician: Veda Gerald, MD [address] [place] [address]  Referring MD: PCP  Chief Complaint: Rectal bleeding and constipation  History of Present Illness: Allison Bolton is a 53 y.o. female with past medical history of asthma, bipolar disorder, substance abuse, fibromyalgia,DM, ovarian and cervical cancer?, ?colon cancer s/p resection and CRT, Mnire's disease, who presents for evaluation of rectal bleeding, abdominal pain and constipation.  Patient reports that 3.5 years ago she presented new onset of abdominal pain in her abdomen diffusely, which she has noticed it is present all the time but improves when she moves her bowels (actually has pain while defecating). She has a bowel movement 3-4 times per week. She strains significantly  to move her bowels. Also feels bloating regularly.  She was living in [place]  in the past and was seen by a GI group at [facility] medicine in [place] . No records are available.  Does not remember the name of the doctor that took care of her but she will try to get the records available to us .  She is having bleeding from rectum for the last year, which happens every time she moves her bowels. Has not changed in severity and is a small amount.  She took Linzess  only when she started having BM, which she only took for a short period of time but felt it helped. She dos not remember what dose she took.  She also feels nauseated.  The patient denies having any vomiting, fever, chills, hematochezia,  hematemesis, diarrhea, jaundice, pruritus. Has been gaining weight.  Most recent CT of the abdomen pelvis without contrast was performed on 05/08/2022 which did not show any liver abnormalities, the only showed presence of inflammatory stranding in the right colon.  Previous CT of the  abdomen pelvis with IV contrast on 05/07/2019 showed 3 low-attenuation lesions in the liver with max size of 16 mm.  Patient reports that she had "colon cancer 3.5 years ago, had surgery, radiation and chemotherapy".  There are no records of this available.  Last [id number] 3 years ago in [place] , no report is available. Last Colonoscopy: 11/15/2018, diverticulosis in sigmoid colon per our records  She states she had a colonoscopy 3.5 years ago in [place]  and had 3 polyps resected, no report is available.  FHx: neg for any gastrointestinal/liver disease, grandparents diagnosed with colon cancer in their 3s Social: smokes 1/2 pack a day, neg alcohol or illicit drug use.  However, the records of previous methamphetamine use in [facility] Surgical: cholecystectomy, c-section, , hemorrhoidectomy, hysterecotmy, ovarian removal, ?partial colectomy  Past Medical History: Past Medical History:  Diagnosis Date   Arthritis    Asthma    Bipolar 1 disorder (HCC)    Diverticulosis    Dysrhythmia    sts "I have heart palpitations"   Fibromyalgia    H/O hiatal hernia    4   Headache(784.0)    Meniere's disease    S/P colonoscopy    Dr. Homero Luster 2010: few small diverticula at sigmoid. otherwise normal.    Shortness of breath     Past Surgical History: Past Surgical History:  Procedure Laterality Date   ABDOMINAL HYSTERECTOMY     APPENDECTOMY     CESAREAN SECTION     CHOLECYSTECTOMY     exploratory laparoscopy     FOOT SURGERY     HEMORRHOID SURGERY  LAPAROSCOPIC APPENDECTOMY  02/19/2011   Procedure: APPENDECTOMY LAPAROSCOPIC;  Surgeon: Beau Bound;  Location: [facility];  Service: General;  Laterality: N/A;   LAPAROSCOPY  02/19/2011   Procedure: LAPAROSCOPY DIAGNOSTIC;  Surgeon: Beau Bound;  Location: [facility];  Service: General;  Laterality: N/A;   left ovarian removal     multiple hernia repairs     Right ovarian removal     TONSILLECTOMY     TONSILLECTOMY AND  ADENOIDECTOMY     tubes in ears     UMBILICAL HERNIA REPAIR  Dec 2011   Dr. Larrie Po   wisdom teeth removal      Family History: Family History  Problem Relation Age of Onset   Thyroid  disease Mother    Colon cancer Father    Breast cancer Maternal Aunt    Colon cancer Brother    Colon cancer Other        paternal and maternal grandfather   Meniere's disease Other    Anesthesia problems Neg Hx    Hypotension Neg Hx    Malignant hyperthermia Neg Hx    Pseudochol deficiency Neg Hx     Social History: Social History   Tobacco Use  Smoking Status Every Day   Current packs/day: 0.00   Average packs/day: 0.5 packs/day for 37.3 years (18.7 ttl pk-yrs)   Types: Cigarettes   Start date: 07/07/1981   Last attempt to quit: 11/05/2018   Years since quitting: 5.0  Smokeless Tobacco Never   Social History   Substance and Sexual Activity  Alcohol Use No   Comment: not since June 2012   Social History   Substance and Sexual Activity  Drug Use No   Comment: patient denies any-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    Allergies: Allergies  Allergen Reactions   Amoxicillin Other (See Comments)    Patient states she feels sunburnt when taking amoxicillin   Tramadol Rash    Medications: Current Outpatient Medications  Medication Sig Dispense Refill   acetaminophen  (TYLENOL ) 325 MG tablet Take 650 mg by mouth every 6 (six) hours as needed.     benztropine  (COGENTIN ) 1 MG tablet Take 1 tablet (1 mg total) by mouth 2 (two) times daily. 60 tablet 2   carvedilol  (COREG ) 25 MG tablet Take 1 tablet (25 mg total) by mouth 2 (two) times daily with a meal. 60 tablet 3   divalproex  (DEPAKOTE ) 250 MG DR tablet Take 3 tablets (750 mg total) by mouth every evening. 90 tablet 2   gabapentin  (NEURONTIN ) 300 MG capsule TAKE 1 CAPSULE BY MOUTH THREE TIMES A DAY     haloperidol  (HALDOL ) 10 MG tablet 1 in a m 2 at hs 90 tablet 2   naproxen  (NAPROSYN ) 500 MG tablet Take 1 tablet (500 mg total)  by mouth 2 (two) times daily. 30 tablet 0   ondansetron  (ZOFRAN -ODT) 4 MG disintegrating tablet Take 4 mg by mouth every 8 (eight) hours as needed.     traZODone  (DESYREL ) 100 MG tablet Take 2 tablets (200 mg total) by mouth at bedtime as needed for sleep. 60 tablet 2   omeprazole  (PRILOSEC) 20 MG capsule Take 20 mg by mouth daily. (Patient not taking: Reported on 11/05/2023)     No current facility-administered medications for this visit.    Review of Systems: GENERAL: negative for malaise, night sweats HEENT: No changes in hearing or vision, no nose bleeds or other nasal problems. NECK: Negative for lumps, goiter, pain and significant  neck swelling RESPIRATORY: Negative for cough, wheezing CARDIOVASCULAR: Negative for chest pain, leg swelling, palpitations, orthopnea GI: SEE HPI MUSCULOSKELETAL: Negative for joint pain or swelling, back pain, and muscle pain. SKIN: Negative for lesions, rash PSYCH: Negative for sleep disturbance, mood disorder and recent psychosocial stressors. HEMATOLOGY Negative for prolonged bleeding, bruising easily, and swollen nodes. ENDOCRINE: Negative for cold or heat intolerance, polyuria, polydipsia and goiter. NEURO: negative for tremor, gait imbalance, syncope and seizures. The remainder of the review of systems is noncontributory.   Physical Exam: BP 115/86 (BP Location: Left Arm, Patient Position: Sitting, Cuff Size: Normal)   Pulse 81   Temp 98.4 F (36.9 C) (Temporal)   Ht 5\' 6"  (1.676 m)   Wt 159 lb 8 oz (72.3 kg)   BMI 25.74 kg/m  GENERAL: The patient is AO x3, in no acute distress. HEENT: Head is normocephalic and atraumatic. EOMI are intact. Mouth is well hydrated.  Poor dentition. NECK: Supple. No masses LUNGS: Clear to auscultation. No presence of rhonchi/wheezing/rales. Adequate chest expansion HEART: RRR, normal s1 and s2. ABDOMEN: Soft, nontender, no guarding, no peritoneal signs, and nondistended. BS +. No masses.  No scars in the  abdomen. EXTREMITIES: Without any cyanosis, clubbing, rash, lesions or edema. NEUROLOGIC: AOx3, no focal motor deficit. SKIN: no jaundice, no rashes   Imaging/Labs: as above  I personally reviewed and interpreted the available labs, imaging and endoscopic files.  Impression and Plan: Allison Bolton is a 53 y.o. female with past medical history of asthma, bipolar disorder, substance abuse, fibromyalgia,DM, ovarian and cervical cancer?, ?colon cancer s/p resection and CRT, Mnire's disease, who presents for evaluation of rectal bleeding, abdominal pain and constipation.  Patient reports a chronic history of abdominal pain and issues with constipation that have led to rectal bleeding intermittently due to frequent straining.  It appears that she had improvement with Linzess  when she took this medication transiently.  Due to this, we will restart Linzess  at 72 mcg daily dosing.  However, given episodes of rectal bleeding, will need to proceed with a colonoscopy for further evaluation of her complaints.  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  Notably, the patient denied using any drugs but there are note reporting she used to take methamphetamines on a regular basis.  Will need to obtain a urine toxicology test prior to procedures.  She reports a history of colon cancer but there is no clinical records of this available.  Also reports having chemotherapy and radiotherapy for this.  Notably, she does not have any scars in her abdomen, which make me suspicious whether she indeed had a surgical intervention or not.  I asked the patient to try to bring the records from her previous care to the next appointment so this can be further clarified.  -Start Linzess  72 mcg every day -Schedule colonoscopy, will need U-Tox prior to procedure   All questions were answered.      Samantha Cress, MD Gastroenterology and Hepatology [facility]

## 2023-11-05 NOTE — Patient Instructions (Addendum)
 Start Linzess  72 mcg every day Schedule colonoscopy, will need U-Tox prior to procedure

## 2023-11-12 ENCOUNTER — Telehealth (INDEPENDENT_AMBULATORY_CARE_PROVIDER_SITE_OTHER): Payer: Self-pay | Admitting: Gastroenterology

## 2023-11-12 DIAGNOSIS — K625 Hemorrhage of anus and rectum: Secondary | ICD-10-CM

## 2023-11-12 MED ORDER — PEG 3350-KCL-NA BICARB-NACL 420 G PO SOLR: 4000.0000 mL | Freq: Once | ORAL | 0 refills | Status: DC

## 2023-11-12 NOTE — Telephone Encounter (Signed)
 Pt contacted and TCS scheduled. Instructions mailed to pt. Prep sent to pharmacy     FYI-Pt states she is still bleeding. Yesterday was a rough day. Pt states that when she goes to the bathroom its almost like having a child. I asked the pt had this worsened since seeing provider on 11/05/23 and she states that it has been like this for years.Per Dr.Castaneda note-provider is aware of rectal bleeding and pain with bowel movements.  Pt is going to [facility] 5/15-5/30. Pt is scheduled for 12/09/23 at 9:30am

## 2023-11-13 NOTE — Addendum Note (Signed)
 Addended by: Brynne Doane on: 11/13/2023 10:04 AM   Modules accepted: Orders

## 2023-11-13 NOTE — Telephone Encounter (Signed)
 FYI-Dr.Castaneda is aware of pt symptoms

## 2023-11-13 NOTE — Telephone Encounter (Signed)
 Would you like me to add those to her pre op labs or have her have lab drawn beforehand?

## 2023-11-13 NOTE — Telephone Encounter (Signed)
 Lab order placed for [facility]. Contacted pt and asked her to have labs done. Pt agreed but would like to have lab done at [facility]; changed order to [facility].

## 2023-11-16 ENCOUNTER — Other Ambulatory Visit (HOSPITAL_COMMUNITY)
Admission: RE | Admit: 2023-11-16 | Discharge: 2023-11-16 | Disposition: A | Discharge status: Home / Self Care | Source: Ambulatory Visit | Attending: Gastroenterology | Admitting: Gastroenterology

## 2023-11-16 DIAGNOSIS — K625 Hemorrhage of anus and rectum: Secondary | ICD-10-CM | POA: Diagnosis present

## 2023-11-16 LAB — CBC WITH DIFFERENTIAL/PLATELET
Abs Immature Granulocytes: 0.04 10*3/uL (ref 0.00–0.07)
Basophils Absolute: 0.1 10*3/uL (ref 0.0–0.1)
Basophils Relative: 1 %
Eosinophils Absolute: 0.3 10*3/uL (ref 0.0–0.5)
Eosinophils Relative: 3 %
HCT: 41.1 % (ref 36.0–46.0)
Hemoglobin: 13.9 g/dL (ref 12.0–15.0)
Immature Granulocytes: 0 %
Lymphocytes Relative: 31 %
Lymphs Abs: 3.6 10*3/uL (ref 0.7–4.0)
MCH: 29 pg (ref 26.0–34.0)
MCHC: 33.8 g/dL (ref 30.0–36.0)
MCV: 85.6 fL (ref 80.0–100.0)
Monocytes Absolute: 0.7 10*3/uL (ref 0.1–1.0)
Monocytes Relative: 7 %
Neutro Abs: 6.6 10*3/uL (ref 1.7–7.7)
Neutrophils Relative %: 58 %
Platelets: 291 10*3/uL (ref 150–400)
RBC: 4.8 MIL/uL (ref 3.87–5.11)
RDW: 14.3 % (ref 11.5–15.5)
WBC: 11.4 10*3/uL — ABNORMAL HIGH (ref 4.0–10.5)
nRBC: 0 % (ref 0.0–0.2)

## 2023-11-17 ENCOUNTER — Encounter (INDEPENDENT_AMBULATORY_CARE_PROVIDER_SITE_OTHER): Payer: Self-pay

## 2023-11-17 ENCOUNTER — Other Ambulatory Visit (INDEPENDENT_AMBULATORY_CARE_PROVIDER_SITE_OTHER): Payer: Self-pay

## 2023-11-17 ENCOUNTER — Ambulatory Visit (INDEPENDENT_AMBULATORY_CARE_PROVIDER_SITE_OTHER): Payer: Self-pay

## 2023-11-17 ENCOUNTER — Telehealth (INDEPENDENT_AMBULATORY_CARE_PROVIDER_SITE_OTHER): Payer: Self-pay | Admitting: Gastroenterology

## 2023-11-17 MED ORDER — PEG 3350-KCL-NA BICARB-NACL 420 G PO SOLR: 4000.0000 mL | Freq: Once | ORAL | 0 refills | Status: AC

## 2023-11-17 NOTE — Telephone Encounter (Signed)
 Pt left voicemail in regards to what she needs to do for her prep. Attempted to reach pt but message stated call can not be completed.

## 2023-11-17 NOTE — Telephone Encounter (Signed)
 Pt left voicemail. Returned call to pt. Pt states she lives in [place] so prep needs to be sent to [facility]. Prep sent to [facility]. Made pt aware that instructions were mailed.

## 2023-11-19 ENCOUNTER — Ambulatory Visit (HOSPITAL_COMMUNITY)

## 2023-12-03 NOTE — Telephone Encounter (Signed)
 Thanks

## 2023-12-08 ENCOUNTER — Telehealth (INDEPENDENT_AMBULATORY_CARE_PROVIDER_SITE_OTHER): Payer: Self-pay | Admitting: Gastroenterology

## 2023-12-08 NOTE — Telephone Encounter (Signed)
Called pt, unable to leave message due to vm being full.

## 2023-12-08 NOTE — Telephone Encounter (Signed)
 Patient LMOM that she has the flu and needs to reschedule her procedure with Dr Sammi Crick that's scheduled for tomorrow. [phone]

## 2023-12-08 NOTE — Telephone Encounter (Addendum)
 Per Dr. Sammi Crick "if she wants to reschedule, she will need to provide a utox first, and then once I receive the test we will schedule her"

## 2023-12-09 ENCOUNTER — Encounter (HOSPITAL_COMMUNITY): Admission: RE | Payer: Self-pay | Source: Home / Self Care

## 2023-12-09 ENCOUNTER — Ambulatory Visit (HOSPITAL_COMMUNITY): Admission: RE | Admit: 2023-12-09 | Source: Home / Self Care | Admitting: Gastroenterology

## 2023-12-09 SURGERY — COLONOSCOPY
Anesthesia: Choice

## 2023-12-10 NOTE — Telephone Encounter (Signed)
 Pt informed to have utox done prior to being scheduled. Advised pt that order is in for [facility]. She states she will go next week to have it done.

## 2023-12-12 NOTE — Telephone Encounter (Signed)
 Thanks

## 2023-12-15 NOTE — Telephone Encounter (Signed)
 Pt pcp called in asking if patients need to have urine prep prior to procedures. I advised if the provider orders it, then yes. Also asked if marijuana is positive will she be cancelled. I advised it is provider/anesthesiology discretion.

## 2023-12-16 ENCOUNTER — Telehealth (INDEPENDENT_AMBULATORY_CARE_PROVIDER_SITE_OTHER): Payer: Self-pay | Admitting: Gastroenterology

## 2023-12-16 ENCOUNTER — Other Ambulatory Visit (HOSPITAL_COMMUNITY)
Admission: RE | Admit: 2023-12-16 | Discharge: 2023-12-16 | Disposition: A | Discharge status: Home / Self Care | Source: Ambulatory Visit | Attending: Gastroenterology | Admitting: Gastroenterology

## 2023-12-16 DIAGNOSIS — K625 Hemorrhage of anus and rectum: Secondary | ICD-10-CM | POA: Diagnosis present

## 2023-12-16 DIAGNOSIS — Z01812 Encounter for preprocedural laboratory examination: Secondary | ICD-10-CM | POA: Diagnosis not present

## 2023-12-16 DIAGNOSIS — Z01818 Encounter for other preprocedural examination: Secondary | ICD-10-CM | POA: Diagnosis present

## 2023-12-16 LAB — RAPID URINE DRUG SCREEN, HOSP PERFORMED
Amphetamines: NOT DETECTED
Barbiturates: NOT DETECTED
Benzodiazepines: NOT DETECTED
Cocaine: NOT DETECTED
Opiates: NOT DETECTED
Tetrahydrocannabinol: NOT DETECTED

## 2023-12-16 NOTE — Telephone Encounter (Signed)
 Castaneda Mayorga, Bearl Limes, MD  Daune Colgate S, CMA Hi Shamela Haydon, Utox was normal. Ok to proceed with scheduling her case. Thanks      Called pt. No answer and VM full

## 2023-12-16 NOTE — Telephone Encounter (Signed)
 Spoke with pt. Scheduled for TCS with Dr. Sammi Crick 6/20. Reports already has her prep. She [address] to pick up instructions.

## 2023-12-16 NOTE — Telephone Encounter (Signed)
 Patient returning call. [phone]

## 2023-12-23 ENCOUNTER — Other Ambulatory Visit: Payer: Self-pay

## 2023-12-23 ENCOUNTER — Ambulatory Visit (HOSPITAL_COMMUNITY): Attending: Internal Medicine

## 2023-12-23 ENCOUNTER — Encounter (HOSPITAL_COMMUNITY): Payer: Self-pay

## 2023-12-23 DIAGNOSIS — M5386 Other specified dorsopathies, lumbar region: Secondary | ICD-10-CM | POA: Insufficient documentation

## 2023-12-23 DIAGNOSIS — R52 Pain, unspecified: Secondary | ICD-10-CM | POA: Diagnosis not present

## 2023-12-23 DIAGNOSIS — Z7409 Other reduced mobility: Secondary | ICD-10-CM | POA: Insufficient documentation

## 2023-12-23 NOTE — Therapy (Signed)
 OUTPATIENT PHYSICAL THERAPY THORACOLUMBAR EVALUATION   Patient Name: Allison Bolton MRN: [id number] DOB:12-Jun-1971, 53 y.o., female Today's Date: 12/23/2023  END OF SESSION:  PT End of Session - 12/23/23 1020     Visit Number 1    Number of Visits 12    Date for PT Re-Evaluation 01/20/24    Authorization Type [employer]    Authorization Time Period no auth    PT Start Time 0930    PT Stop Time 1011    PT Time Calculation (min) 41 min    Activity Tolerance Patient tolerated treatment well;Patient limited by pain    Behavior During Therapy WFL for tasks assessed/performed          Past Medical History:  Diagnosis Date   Arthritis    Asthma    Bipolar 1 disorder (HCC)    Diverticulosis    Dysrhythmia    sts I have heart palpitations   Fibromyalgia    H/O hiatal hernia    4   Headache(784.0)    Meniere's disease    S/P colonoscopy    Dr. Homero Luster 2010: few small diverticula at sigmoid. otherwise normal.    Shortness of breath    Past Surgical History:  Procedure Laterality Date   ABDOMINAL HYSTERECTOMY     APPENDECTOMY     CESAREAN SECTION     CHOLECYSTECTOMY     exploratory laparoscopy     FOOT SURGERY     HEMORRHOID SURGERY     LAPAROSCOPIC APPENDECTOMY  02/19/2011   Procedure: APPENDECTOMY LAPAROSCOPIC;  Surgeon: Beau Bound;  Location: [facility];  Service: General;  Laterality: N/A;   LAPAROSCOPY  02/19/2011   Procedure: LAPAROSCOPY DIAGNOSTIC;  Surgeon: Beau Bound;  Location: [facility];  Service: General;  Laterality: N/A;   left ovarian removal     multiple hernia repairs     Right ovarian removal     TONSILLECTOMY     TONSILLECTOMY AND ADENOIDECTOMY     tubes in ears     UMBILICAL HERNIA REPAIR  Dec 2011   Dr. Larrie Po   wisdom teeth removal     Patient Active Problem List   Diagnosis Date Noted   IBS (irritable bowel syndrome) 11/05/2023   Elevated TSH    Bipolar I disorder, current or most recent episode manic, with psychotic  features (HCC)    Delusional disorder (HCC) 05/10/2019   Chest pain 07/18/2013   Severe bipolar I disorder, current or most recent episode depressed (HCC) 05/13/2013   Smoking 05/01/2012   Generalized anxiety disorder 06/27/2011   Liver lesion 02/04/2011    PCP: Veda Gerald, MD  REFERRING PROVIDER: Veda Gerald, MD  REFERRING DIAG: low back pain, cervicalgia  Rationale for Evaluation and Treatment: Rehabilitation  THERAPY DIAG:  Impaired functional mobility, balance, gait, and endurance  Generalized pain  Decreased ROM of lumbar spine  ONSET DATE: Chronic, at least 17 years  SUBJECTIVE:  SUBJECTIVE STATEMENT: Patient reports she been dealing with low back pain and neck pain for many years. Reports back pain as the worst right now, although does report hands go numb a lot. Always had problems with her back, thinks it runs in her family. Feels like an ache, always present. Hurts to stand, painful at night when she tries to go to sleep. Reports she fell a lot in the snow when she lived in [place]  just months ago, which also hurt her hip. Reports she is homeless at this time but stays with different family.   PERTINENT HISTORY:  Cancer, Colon, ovarian Hysterectomy Severe Bipolar disorder 1 Delusional Disorder    PAIN:  Are you having pain? Yes: NPRS scale: 10/10 Pain location: Low back, across whole back  Pain description: Ache Aggravating factors: Standing, Cooking, Can't walk very far Relieving factors: Nothing  PRECAUTIONS: None  RED FLAGS: None   WEIGHT BEARING RESTRICTIONS: No  FALLS:  Has patient fallen in last 6 months? Yes. Number of falls Unable to recall, but a lot. Loses balance a lot  LIVING ENVIRONMENT: Lives with: lives with their family homeless but stays with  family Stairs: Yes: External: 8 steps at aunts home where she is now  steps; none Has following equipment at home: None  OCCUPATION: N/A  PLOF: Independent  PATIENT GOALS: To not be in pain anymore  NEXT MD VISIT: Next month  OBJECTIVE:  Note: Objective measures were completed at Evaluation unless otherwise noted.  DIAGNOSTIC FINDINGS:  2020: FINDINGS: Five lumbar type vertebra. There is no acute fracture subluxation of the lumbar spine. The bones are osteopenic. The visualized posterior elements appear intact. Lower lumbar facet arthropathy. Mild narrowing of the L4-5 and L5-S1 neural foramina. The soft tissues are unremarkable. Right upper quadrant cholecystectomy clips.   IMPRESSION: No acute/traumatic lumbar spine pathology.  PATIENT SURVEYS:  Modified Oswestry Low Back Pain Disability Questionnaire: 36 / 50 = 72.0 %  COGNITION: Overall cognitive status: See Pertinent History     SENSATION: WFL  POSTURE: rounded shoulders and forward head  PALPATION: Very sensitive to very light/any touch to lumbar and thoracic spine as well as paraspinal musculature, to near tears.   LUMBAR ROM:   AROM eval  Flexion 25% avail *  Extension Barely any movement *  Right lateral flexion Barely any movement *  Left lateral flexion Barely any movement *  Right rotation Just above knee joint *  Left rotation Just above knee joint *   (Blank rows = not tested)   *=pain  LOWER EXTREMITY ROM:     Active  Right eval Left eval  Hip flexion    Hip extension    Hip abduction    Hip adduction    Hip internal rotation    Hip external rotation    Knee flexion    Knee extension    Ankle dorsiflexion    Ankle plantarflexion    Ankle inversion    Ankle eversion     (Blank rows = not tested)  LOWER EXTREMITY MMT:    MMT Right eval Left eval  Hip flexion 3 * 3*  Hip extension    Hip abduction    Hip adduction    Hip internal rotation    Hip external rotation    Knee  flexion 3 * 3*  Knee extension 3 * 3*  Ankle dorsiflexion 3+ * 3+ *  Ankle plantarflexion    Ankle inversion    Ankle eversion     (  Blank rows = not tested)  LUMBAR SPECIAL TESTS:    FUNCTIONAL TESTS:  5 times sit to stand: 22 STS 2 minute walk test: Next Session   GAIT: Distance walked: 100 Assistive device utilized: None Level of assistance: Complete Independence Comments: Antalgic at times but WNL at times  TREATMENT DATE:  12/23/23: PT Eval and HEP                                                                                                                                 PATIENT EDUCATION:  Education details: PT evaluation, objective findings, POC, Importance of HEP, Precautions, Clinic policie Person educated: Patient Education method: Explanation and Demonstration Education comprehension: verbalized understanding and returned demonstration  HOME EXERCISE PROGRAM: Access Code: [email or web address] URL: [email or web address] Date: 12/23/2023 Prepared by: Virgia Griffins Powell-Butler  Exercises - Supine Transversus Abdominis Bracing - Hands on Stomach  - 2 x daily - 7 x weekly - 3 sets - 10 reps - 10 hold - Decompression exercise 2  - 2 x daily - 7 x weekly - 3 sets - 10 reps, 5 holds - Supine Lower Trunk Rotation  - 2 x daily - 7 x weekly - 3 sets - 10 reps to tolerance  ASSESSMENT:  CLINICAL IMPRESSION: Patient is a 53 y.o. female who was seen today for physical therapy evaluation and treatment for low back pain, cervicalgia. Patient arrives to PT session with reports of pain throughout spine but mostly in low back at this time. Patient reports a relevant history of frequent falls when she lived in [place]  due to snow.  On this date, patient exhibits decreased lumbar ROM, LE strength, decreased activity/tolerance, impaired functional transfers, and increased pain that are all impairing her overall function. Patient with history of bipolar and delusional disorder per  medical records, which may have altered some objective testing this date. Patient may benefit from pain science education as well. Patient will benefit from skilled physical therapy in order to address the above deficits and improve QOL/function.    OBJECTIVE IMPAIRMENTS: Abnormal gait, decreased activity tolerance, decreased balance, decreased coordination, decreased endurance, decreased mobility, difficulty walking, decreased ROM, decreased strength, increased muscle spasms, improper body mechanics, postural dysfunction, and pain.   ACTIVITY LIMITATIONS: carrying, lifting, bending, sitting, standing, squatting, sleeping, stairs, transfers, and bed mobility  PARTICIPATION LIMITATIONS: meal prep, cleaning, laundry, and community activity  PERSONAL FACTORS: 3+ comorbidities: Severe Bipolar disorder 1, Delusional Disorder, Chronicity of pain are also affecting patient's functional outcome.   REHAB POTENTIAL: Fair See Personal factors  CLINICAL DECISION MAKING: Evolving/moderate complexity  EVALUATION COMPLEXITY: Moderate   GOALS: Goals reviewed with patient? No  SHORT TERM GOALS: Target date: 01/06/24 Patient will be independent with performance of HEP to demonstrate adequate self management of symptoms.  Baseline:  Goal status: INITIAL  2.   Patient will report at least a 25% improvement with function or pain overall since beginning PT. Baseline:  Goal status: INITIAL  LONG TERM GOALS: Target date: 02/03/24 Patient will improve Oswestry score by  10   points in order to improve self-perceived disability and overall function.  Baseline: Goal status: INITIAL   2.  Patient will improve all LE MMT to at least 4-/5 in order to demonstrate improved strength for functional transfers .  Baseline: Goal status: INITIAL   3.  Patient will improve  5 times sit to stand  test by 5 seconds  in order to demonstrate improved LE strength and endurance for community ambulation . Baseline:  Goal  status: INITIAL  4.  Patient will improve lumbar flex/ext ROM to at least 50% avail for improved independence with ADL/iADLs.  Baseline:  Goal status: INITIAL   5.  Patient will report overall 50% improvement since beginning PT. Baseline:  Goal status: INITIAL   PLAN:  PT FREQUENCY: 2x/week  PT DURATION: 6 weeks  PLANNED INTERVENTIONS: 97164- PT Re-evaluation, 97110-Therapeutic exercises, 97530- Therapeutic activity, [id number]- Neuromuscular re-education, 97535- Self Care, 84132- Manual therapy, [id number]- Gait training, [phone]- Electrical stimulation (manual), [id number]- Traction (mechanical), [phone] (1-2 muscles), 20561 (3+ muscles)- Dry Needling, Patient/Family education, Balance training, Stair training, Taping, Joint mobilization, Spinal mobilization, Cryotherapy, and Moist heat.  PLAN FOR NEXT SESSION: test, pain science education, progress lumbar ROM to pt tolerance, LE strengthening   1:02 PM, 12/23/23 Adriona Kaney Powell-Butler, PT, DPT [facility]

## 2023-12-25 ENCOUNTER — Ambulatory Visit (HOSPITAL_COMMUNITY)
Admission: RE | Admit: 2023-12-25 | Discharge: 2023-12-25 | Disposition: A | Discharge status: Home / Self Care | Attending: Gastroenterology | Admitting: Gastroenterology

## 2023-12-25 ENCOUNTER — Ambulatory Visit (HOSPITAL_COMMUNITY): Payer: Self-pay | Admitting: Anesthesiology

## 2023-12-25 ENCOUNTER — Encounter (HOSPITAL_COMMUNITY)
Admission: RE | Disposition: A | Discharge status: Home / Self Care | Payer: Self-pay | Source: Home / Self Care | Attending: Gastroenterology

## 2023-12-25 ENCOUNTER — Encounter (HOSPITAL_COMMUNITY): Payer: Self-pay | Admitting: Gastroenterology

## 2023-12-25 ENCOUNTER — Other Ambulatory Visit: Payer: Self-pay

## 2023-12-25 DIAGNOSIS — F419 Anxiety disorder, unspecified: Secondary | ICD-10-CM | POA: Diagnosis not present

## 2023-12-25 DIAGNOSIS — M797 Fibromyalgia: Secondary | ICD-10-CM | POA: Insufficient documentation

## 2023-12-25 DIAGNOSIS — K625 Hemorrhage of anus and rectum: Secondary | ICD-10-CM | POA: Diagnosis present

## 2023-12-25 DIAGNOSIS — K648 Other hemorrhoids: Secondary | ICD-10-CM

## 2023-12-25 DIAGNOSIS — D122 Benign neoplasm of ascending colon: Secondary | ICD-10-CM | POA: Diagnosis not present

## 2023-12-25 DIAGNOSIS — K644 Residual hemorrhoidal skin tags: Secondary | ICD-10-CM | POA: Diagnosis not present

## 2023-12-25 DIAGNOSIS — G709 Myoneural disorder, unspecified: Secondary | ICD-10-CM | POA: Diagnosis not present

## 2023-12-25 DIAGNOSIS — K635 Polyp of colon: Secondary | ICD-10-CM | POA: Diagnosis not present

## 2023-12-25 DIAGNOSIS — K5730 Diverticulosis of large intestine without perforation or abscess without bleeding: Secondary | ICD-10-CM | POA: Diagnosis not present

## 2023-12-25 DIAGNOSIS — H8109 Meniere's disease, unspecified ear: Secondary | ICD-10-CM | POA: Insufficient documentation

## 2023-12-25 DIAGNOSIS — F319 Bipolar disorder, unspecified: Secondary | ICD-10-CM | POA: Diagnosis not present

## 2023-12-25 DIAGNOSIS — J45909 Unspecified asthma, uncomplicated: Secondary | ICD-10-CM | POA: Diagnosis not present

## 2023-12-25 DIAGNOSIS — F17210 Nicotine dependence, cigarettes, uncomplicated: Secondary | ICD-10-CM | POA: Diagnosis not present

## 2023-12-25 DIAGNOSIS — I10 Essential (primary) hypertension: Secondary | ICD-10-CM | POA: Diagnosis not present

## 2023-12-25 DIAGNOSIS — M1990 Unspecified osteoarthritis, unspecified site: Secondary | ICD-10-CM | POA: Insufficient documentation

## 2023-12-25 DIAGNOSIS — E119 Type 2 diabetes mellitus without complications: Secondary | ICD-10-CM | POA: Diagnosis not present

## 2023-12-25 DIAGNOSIS — D123 Benign neoplasm of transverse colon: Secondary | ICD-10-CM

## 2023-12-25 DIAGNOSIS — F17200 Nicotine dependence, unspecified, uncomplicated: Secondary | ICD-10-CM | POA: Diagnosis not present

## 2023-12-25 HISTORY — DX: Essential (primary) hypertension: I10

## 2023-12-25 HISTORY — PX: COLONOSCOPY: SHX5424

## 2023-12-25 LAB — HM COLONOSCOPY

## 2023-12-25 SURGERY — COLONOSCOPY
Anesthesia: General

## 2023-12-25 MED ORDER — PROPOFOL 500 MG/50ML IV EMUL
INTRAVENOUS | Status: DC | PRN
Start: 2023-12-25 — End: 2023-12-25
  Administered 2023-12-25: 140 mg via INTRAVENOUS
  Administered 2023-12-25: 80 mg via INTRAVENOUS
  Administered 2023-12-25: 125 ug/kg/min via INTRAVENOUS

## 2023-12-25 MED ORDER — LACTATED RINGERS IV SOLN: INTRAVENOUS | Status: DC

## 2023-12-25 NOTE — Anesthesia Preprocedure Evaluation (Signed)
 Anesthesia Evaluation  Patient identified by MRN, date of birth, ID band Patient awake    Reviewed: Allergy & Precautions, NPO status , Patient's Chart, lab work & pertinent test results, reviewed documented beta blocker date and time   Airway Mallampati: II  TM Distance: >3 FB Neck ROM: Full    Dental  (+) Partial Upper   Pulmonary with exertion, asthma , Current SmokerPatient did not abstain from smoking.          Cardiovascular hypertension, + dysrhythmias  Rhythm:Regular     Neuro/Psych  Headaches  Anxiety  Bipolar Disorder    Neuromuscular disease    GI/Hepatic   Endo/Other    Renal/GU      Musculoskeletal  (+) Arthritis ,  Fibromyalgia -  Abdominal   Peds  Hematology   Anesthesia Other Findings   Reproductive/Obstetrics                             Anesthesia Physical Anesthesia Plan  ASA: 2  Anesthesia Plan: General   Post-op Pain Management:    Induction: Intravenous  PONV Risk Score and Plan:   Airway Management Planned: Nasal Cannula  Additional Equipment:   Intra-op Plan:   Post-operative Plan:   Informed Consent: I have reviewed the patients History and Physical, chart, labs and discussed the procedure including the risks, benefits and alternatives for the proposed anesthesia with the patient or authorized representative who has indicated his/her understanding and acceptance.     Dental advisory given  Plan Discussed with:   Anesthesia Plan Comments:        Anesthesia Quick Evaluation

## 2023-12-25 NOTE — Discharge Instructions (Signed)
 You are being discharged to home.  Resume your previous diet.  We are waiting for your pathology results.  Your physician has recommended a repeat colonoscopy for surveillance based on pathology results.  Continue your present medications. If straining to have a bowel movements, can increase Linzess  to 2 pills per day.

## 2023-12-25 NOTE — Transfer of Care (Signed)
 Immediate Anesthesia Transfer of Care Note  Patient: Allison Bolton  Procedure(s) Performed: COLONOSCOPY  Patient Location: Endoscopy Unit  Anesthesia Type:General  Level of Consciousness: awake and patient cooperative  Airway & Oxygen Therapy: Patient Spontanous Breathing  Post-op Assessment: Report given to RN and Post -op Vital signs reviewed and stable  Post vital signs: Reviewed and stable  Last Vitals:  Vitals Value Taken Time  BP 86/64 12/25/23 11:06  Temp 36.5 C 12/25/23 11:06  Pulse 68 12/25/23 11:06  Resp 19 12/25/23 11:06  SpO2 98 % 12/25/23 11:06    Last Pain:  Vitals:   12/25/23 1106  TempSrc: Axillary  PainSc: 0-No pain      Patients Stated Pain Goal: 8 (12/25/23 0943)  Complications: No notable events documented.

## 2023-12-25 NOTE — H&P (Addendum)
 Allison Bolton is an 53 y.o. female.   Chief Complaint: history of colon cancer, rectal bleeding, abdominal pain and constipation.  HPI: Allison Bolton is a 53 y.o. female with past medical history of asthma, bipolar disorder, substance abuse, fibromyalgia,DM, ovarian and cervical cancer?, ?colon cancer s/p resection and CRT, Mnire's disease, who presents for evaluation of possible history of colon cancer, rectal bleeding, abdominal pain and constipation.   Patient is still having some intermittent rectal bleeding but constipation has resolved with Linzess .  Still having some abdominal pain.  The patient denies having any nausea, vomiting, fever, chills, , melena, hematemesis, diarrhea, jaundice, pruritus or weight loss.   Past Medical History:  Diagnosis Date   Arthritis    Asthma    Bipolar 1 disorder (HCC)    Diverticulosis    Dysrhythmia    sts I have heart palpitations   Fibromyalgia    H/O hiatal hernia    4   Headache(784.0)    High blood pressure    Meniere's disease    S/P colonoscopy    Dr. Homero Luster 2010: few small diverticula at sigmoid. otherwise normal.    Shortness of breath     Past Surgical History:  Procedure Laterality Date   ABDOMINAL HYSTERECTOMY     APPENDECTOMY     CESAREAN SECTION     CHOLECYSTECTOMY     exploratory laparoscopy     FOOT SURGERY     HEMORRHOID SURGERY     LAPAROSCOPIC APPENDECTOMY  02/19/2011   Procedure: APPENDECTOMY LAPAROSCOPIC;  Surgeon: Beau Bound;  Location: [facility];  Service: General;  Laterality: N/A;   LAPAROSCOPY  02/19/2011   Procedure: LAPAROSCOPY DIAGNOSTIC;  Surgeon: Beau Bound;  Location: [facility];  Service: General;  Laterality: N/A;   left ovarian removal     multiple hernia repairs     Right ovarian removal     TONSILLECTOMY     TONSILLECTOMY AND ADENOIDECTOMY     tubes in ears     UMBILICAL HERNIA REPAIR  Dec 2011   Dr. Larrie Po   wisdom teeth removal      Family History  Problem Relation Age of  Onset   Thyroid  disease Mother    Colon cancer Father    Breast cancer Maternal Aunt    Colon cancer Brother    Colon cancer Other        paternal and maternal grandfather   Meniere's disease Other    Anesthesia problems Neg Hx    Hypotension Neg Hx    Malignant hyperthermia Neg Hx    Pseudochol deficiency Neg Hx    Social History:  reports that she has been smoking cigarettes. She started smoking about 42 years ago. She has a 18.7 pack-year smoking history. She has never used smokeless tobacco. She reports that she does not drink alcohol and does not use drugs.  Allergies:  Allergies  Allergen Reactions   Amoxicillin Other (See Comments)    Patient states she feels sunburnt when taking amoxicillin   Tramadol Rash    Medications Prior to Admission  Medication Sig Dispense Refill   benztropine  (COGENTIN ) 1 MG tablet Take 1 tablet (1 mg total) by mouth 2 (two) times daily. 60 tablet 2   carvedilol  (COREG ) 25 MG tablet Take 1 tablet (25 mg total) by mouth 2 (two) times daily with a meal. 60 tablet 3   divalproex  (DEPAKOTE ) 250 MG DR tablet Take 3 tablets (750 mg total) by mouth every evening. 90 tablet  2   gabapentin  (NEURONTIN ) 300 MG capsule TAKE 1 CAPSULE BY MOUTH THREE TIMES A DAY     linaclotide  (LINZESS ) 72 MCG capsule Take 1 capsule (72 mcg total) by mouth daily before breakfast. 90 capsule 3   ondansetron  (ZOFRAN -ODT) 4 MG disintegrating tablet Take 4 mg by mouth every 8 (eight) hours as needed.     traZODone  (DESYREL ) 100 MG tablet Take 2 tablets (200 mg total) by mouth at bedtime as needed for sleep. 60 tablet 2   acetaminophen  (TYLENOL ) 325 MG tablet Take 650 mg by mouth every 6 (six) hours as needed.     haloperidol  (HALDOL ) 10 MG tablet 1 in a m 2 at hs 90 tablet 2   naproxen  (NAPROSYN ) 500 MG tablet Take 1 tablet (500 mg total) by mouth 2 (two) times daily. 30 tablet 0   omeprazole  (PRILOSEC) 20 MG capsule Take 20 mg by mouth daily. (Patient not taking: Reported on  11/05/2023)      No results found for this or any previous visit (from the past 48 hours). No results found.  Review of Systems  Gastrointestinal:  Positive for abdominal pain and blood in stool.  All other systems reviewed and are negative.   Blood pressure 99/71, pulse 70, temperature (!) 97.5 F (36.4 C), temperature source Oral, resp. rate 11, height 5' 6 (1.676 m), weight 70.8 kg, SpO2 97%. Physical Exam  GENERAL: The patient is AO x3, in no acute distress. HEENT: Head is normocephalic and atraumatic. EOMI are intact. Mouth is well hydrated and without lesions. NECK: Supple. No masses LUNGS: Clear to auscultation. No presence of rhonchi/wheezing/rales. Adequate chest expansion HEART: RRR, normal s1 and s2. ABDOMEN: Soft, nontender, no guarding, no peritoneal signs, and nondistended. BS +. No masses. EXTREMITIES: Without any cyanosis, clubbing, rash, lesions or edema. NEUROLOGIC: AOx3, no focal motor deficit. SKIN: no jaundice, no rashes  Assessment/Plan Allison Bolton is a 53 y.o. female with past medical history of asthma, bipolar disorder, substance abuse, fibromyalgia,DM, ovarian and cervical cancer?, ?colon cancer s/p resection and CRT, Mnire's disease, who presents for evaluation of possible history of colon cancer, rectal bleeding, abdominal pain and constipation.  Will proceed with   colonoscopy.  Urban Garden, MD 12/25/2023, 10:22 AM

## 2023-12-25 NOTE — Op Note (Signed)
 [facility] Patient Name: Allison Bolton Procedure Date: 12/25/2023 10:08 AM MRN: [id number] Date of Birth: 1971-03-23 Attending MD: Samantha Cress , , [id number] CSN: [id number] Age: 53 Admit Type: Outpatient Procedure:                Colonoscopy Indications:              Rectal bleeding Providers:                Samantha Cress, Troy Furnish. Hazeline Lister RN, RN, Alisa App, Wyndham Butter, Technician Referring MD:              Medicines:                Monitored Anesthesia Care Complications:            No immediate complications. Estimated Blood Loss:     Estimated blood loss: none. Procedure:                Pre-Anesthesia Assessment:                           - Prior to the procedure, a History and Physical                            was performed, and patient medications, allergies                            and sensitivities were reviewed. The patient's                            tolerance of previous anesthesia was reviewed.                           - The risks and benefits of the procedure and the                            sedation options and risks were discussed with the                            patient. All questions were answered and informed                            consent was obtained.                           - ASA Grade Assessment: II - A patient with mild                            systemic disease.                           After obtaining informed consent, the colonoscope                            was passed under direct vision. Throughout the  procedure, the patient's blood pressure, pulse, and                            oxygen saturations were monitored continuously. The                            [id number]-HQ190L ([id number]) scope was introduced through                            the anus and advanced to the the cecum, identified                            by appendiceal orifice and ileocecal valve.  The                            colonoscopy was performed without difficulty. The                            patient tolerated the procedure well. The quality                            of the bowel preparation was adequate. Scope In: 10:38:55 AM Scope Out: 11:02:51 AM Scope Withdrawal Time: 0 hours 15 minutes 5 seconds  Total Procedure Duration: 0 hours 23 minutes 56 seconds  Findings:      External hemorrhoids were found on perianal exam.      Three sessile polyps were found in the transverse colon and ascending       colon. The polyps were 3 to 5 mm in size. These polyps were removed with       a cold snare. Resection and retrieval were complete.      A few small-mouthed diverticula were found in the sigmoid colon and       descending colon.      Non-bleeding internal hemorrhoids were found during retroflexion. The       hemorrhoids were small. Impression:               - Hemorrhoids found on perianal exam.                           - Three 3 to 5 mm polyps in the transverse colon                            and in the ascending colon, removed with a cold                            snare. Resected and retrieved.                           - Diverticulosis in the sigmoid colon and in the                            descending colon.                           - Non-bleeding internal  hemorrhoids. Moderate Sedation:      Per Anesthesia Care Recommendation:           - Discharge patient to home (ambulatory).                           - Resume previous diet.                           - Await pathology results.                           - Repeat colonoscopy for surveillance based on                            pathology results.                           - Continue present medications. If straining to                            have a bowel movements, can increase Linzess  to 2                            pills per day. Procedure Code(s):        --- Professional ---                            [phone], Colonoscopy, flexible; with removal of                            tumor(s), polyp(s), or other lesion(s) by snare                            technique Diagnosis Code(s):        --- Professional ---                           D12.3, Benign neoplasm of transverse colon (hepatic                            flexure or splenic flexure)                           D12.2, Benign neoplasm of ascending colon                           K64.8, Other hemorrhoids                           K62.5, Hemorrhage of anus and rectum                           K57.30, Diverticulosis of large intestine without                            perforation or abscess without bleeding CPT copyright 2022 American [employer] Association. All rights reserved. The codes  documented in this report are preliminary and upon coder review may  be revised to meet current compliance requirements. Samantha Cress, MD Samantha Cress,  12/25/2023 11:10:03 AM This report has been signed electronically. Number of Addenda: 0

## 2023-12-25 NOTE — Anesthesia Procedure Notes (Signed)
 Date/Time: 12/25/2023 10:24 AM  Performed by: Verline Glow, CRNAPre-anesthesia Checklist: Patient identified, Emergency Drugs available, Suction available, Timeout performed and Patient being monitored Patient Re-evaluated:Patient Re-evaluated prior to induction Oxygen Delivery Method: Nasal Cannula

## 2023-12-26 NOTE — Anesthesia Postprocedure Evaluation (Signed)
 Anesthesia Post Note  Patient: Allison Bolton  Procedure(s) Performed: COLONOSCOPY  Patient location during evaluation: Phase II Anesthesia Type: General Level of consciousness: awake Pain management: pain level controlled Vital Signs Assessment: post-procedure vital signs reviewed and stable Respiratory status: spontaneous breathing and respiratory function stable Cardiovascular status: blood pressure returned to baseline and stable Postop Assessment: no headache and no apparent nausea or vomiting Anesthetic complications: no Comments: Late entry   No notable events documented.   Last Vitals:  Vitals:   12/25/23 1106 12/25/23 1111  BP: (!) 86/64 90/60  Pulse: 68 78  Resp: 19 20  Temp: 36.5 C   SpO2: 98% 98%    Last Pain:  Vitals:   12/25/23 1106  TempSrc: Axillary  PainSc: 0-No pain                 Yvonna JINNY Bosworth

## 2023-12-29 ENCOUNTER — Ambulatory Visit (INDEPENDENT_AMBULATORY_CARE_PROVIDER_SITE_OTHER): Payer: Self-pay | Admitting: Gastroenterology

## 2023-12-29 LAB — SURGICAL PATHOLOGY

## 2023-12-30 ENCOUNTER — Encounter (HOSPITAL_COMMUNITY): Payer: Self-pay | Admitting: Gastroenterology

## 2024-01-01 ENCOUNTER — Encounter (INDEPENDENT_AMBULATORY_CARE_PROVIDER_SITE_OTHER): Payer: Self-pay | Admitting: *Deleted

## 2024-01-05 DIAGNOSIS — I10 Essential (primary) hypertension: Secondary | ICD-10-CM | POA: Diagnosis not present

## 2024-01-05 DIAGNOSIS — M542 Cervicalgia: Secondary | ICD-10-CM | POA: Diagnosis not present

## 2024-01-05 DIAGNOSIS — Z0000 Encounter for general adult medical examination without abnormal findings: Secondary | ICD-10-CM | POA: Diagnosis not present

## 2024-01-05 DIAGNOSIS — Z6827 Body mass index (BMI) 27.0-27.9, adult: Secondary | ICD-10-CM | POA: Diagnosis not present

## 2024-01-05 DIAGNOSIS — M6740 Ganglion, unspecified site: Secondary | ICD-10-CM | POA: Diagnosis not present

## 2024-01-05 DIAGNOSIS — M5459 Other low back pain: Secondary | ICD-10-CM | POA: Diagnosis not present

## 2024-01-05 DIAGNOSIS — G603 Idiopathic progressive neuropathy: Secondary | ICD-10-CM | POA: Diagnosis not present

## 2024-01-05 DIAGNOSIS — E1169 Type 2 diabetes mellitus with other specified complication: Secondary | ICD-10-CM | POA: Diagnosis not present

## 2024-01-05 DIAGNOSIS — I872 Venous insufficiency (chronic) (peripheral): Secondary | ICD-10-CM | POA: Diagnosis not present

## 2024-01-05 DIAGNOSIS — J309 Allergic rhinitis, unspecified: Secondary | ICD-10-CM | POA: Diagnosis not present

## 2024-01-06 ENCOUNTER — Ambulatory Visit (HOSPITAL_COMMUNITY): Attending: Internal Medicine

## 2024-01-06 DIAGNOSIS — Z7409 Other reduced mobility: Secondary | ICD-10-CM | POA: Diagnosis not present

## 2024-01-06 DIAGNOSIS — M5386 Other specified dorsopathies, lumbar region: Secondary | ICD-10-CM | POA: Insufficient documentation

## 2024-01-06 DIAGNOSIS — R52 Pain, unspecified: Secondary | ICD-10-CM | POA: Diagnosis not present

## 2024-01-06 NOTE — Therapy (Signed)
 OUTPATIENT PHYSICAL THERAPY THORACOLUMBAR EVALUATION   Patient Name: Allison Bolton MRN: [id number] DOB:[birth year], 53 y.o., female Today's Date: 01/06/2024  END OF SESSION:  PT End of Session - 01/06/24 1505     Visit Number 2    Number of Visits 12    Date for PT Re-Evaluation 01/20/24    Authorization Type [employer]    Authorization Time Period no auth    Progress Note Due on Visit 10    PT Start Time 1434    PT Stop Time 1514    PT Time Calculation (min) 40 min    Activity Tolerance Patient tolerated treatment well;Patient limited by pain    Behavior During Therapy WFL for tasks assessed/performed           Past Medical History:  Diagnosis Date   Arthritis    Asthma    Bipolar 1 disorder (HCC)    Diverticulosis    Dysrhythmia    sts I have heart palpitations   Fibromyalgia    H/O hiatal hernia    4   Headache(784.0)    High blood pressure    Meniere's disease    S/P colonoscopy    Dr. Golda 2010: few small diverticula at sigmoid. otherwise normal.    Shortness of breath    Past Surgical History:  Procedure Laterality Date   ABDOMINAL HYSTERECTOMY     APPENDECTOMY     CESAREAN SECTION     CHOLECYSTECTOMY     COLONOSCOPY N/A 12/25/2023   Procedure: COLONOSCOPY;  Surgeon: Eartha Angelia Sieving, MD;  Location: [facility];  Service: Gastroenterology;  Laterality: N/A;  11:30am, asa 1   exploratory laparoscopy     FOOT SURGERY     HEMORRHOID SURGERY     LAPAROSCOPIC APPENDECTOMY  02/19/2011   Procedure: APPENDECTOMY LAPAROSCOPIC;  Surgeon: Oneil DELENA Budge;  Location: [facility];  Service: General;  Laterality: N/A;   LAPAROSCOPY  02/19/2011   Procedure: LAPAROSCOPY DIAGNOSTIC;  Surgeon: Oneil DELENA Budge;  Location: [facility];  Service: General;  Laterality: N/A;   left ovarian removal     multiple hernia repairs     Right ovarian removal     TONSILLECTOMY     TONSILLECTOMY AND ADENOIDECTOMY     tubes in ears     UMBILICAL HERNIA REPAIR  Dec  2011   Dr. Budge   wisdom teeth removal     Patient Active Problem List   Diagnosis Date Noted   Rectal bleeding 12/25/2023   IBS (irritable bowel syndrome) 11/05/2023   Elevated TSH    Bipolar I disorder, current or most recent episode manic, with psychotic features (HCC)    Delusional disorder (HCC) 05/10/2019   Chest pain 07/18/2013   Severe bipolar I disorder, current or most recent episode depressed (HCC) 05/13/2013   Smoking 05/01/2012   Generalized anxiety disorder 06/27/2011   Liver lesion 02/04/2011    PCP: Orpha Yancey DELENA, MD  REFERRING PROVIDER: Orpha Yancey DELENA, MD  REFERRING DIAG: low back pain, cervicalgia  Rationale for Evaluation and Treatment: Rehabilitation  THERAPY DIAG:  Impaired functional mobility, balance, gait, and endurance  Generalized pain  Decreased ROM of lumbar spine  ONSET DATE: Chronic, at least 17 years  SUBJECTIVE:  SUBJECTIVE STATEMENT: Pt reporting that her low back is hurting really bad today.Pt reports doesn't know how to describe the pain current moment. Pt reporting she is awaiting results for for polyps in her colon.   PERTINENT HISTORY:  Cancer, Colon, ovarian Hysterectomy Severe Bipolar disorder 1 Delusional Disorder    PAIN:  Are you having pain? Yes: NPRS scale: 10/10 Pain location: Low back, across whole back  Pain description: Ache Aggravating factors: Standing, Cooking, Can't walk very far Relieving factors: Nothing  PRECAUTIONS: None  RED FLAGS: None   WEIGHT BEARING RESTRICTIONS: No  FALLS:  Has patient fallen in last 6 months? Yes. Number of falls Unable to recall, but a lot. Loses balance a lot  LIVING ENVIRONMENT: Lives with: lives with their family homeless but stays with family Stairs: Yes: External: 8 steps at  aunts home where she is now  steps; none Has following equipment at home: None  OCCUPATION: N/A  PLOF: Independent  PATIENT GOALS: To not be in pain anymore  NEXT MD VISIT: Next month  OBJECTIVE:  Note: Objective measures were completed at Evaluation unless otherwise noted.  DIAGNOSTIC FINDINGS:  2020: FINDINGS: Five lumbar type vertebra. There is no acute fracture subluxation of the lumbar spine. The bones are osteopenic. The visualized posterior elements appear intact. Lower lumbar facet arthropathy. Mild narrowing of the L4-5 and L5-S1 neural foramina. The soft tissues are unremarkable. Right upper quadrant cholecystectomy clips.   IMPRESSION: No acute/traumatic lumbar spine pathology.  PATIENT SURVEYS:  Modified Oswestry Low Back Pain Disability Questionnaire: 36 / 50 = 72.0 %  COGNITION: Overall cognitive status: See Pertinent History     SENSATION: WFL  POSTURE: rounded shoulders and forward head  PALPATION: Very sensitive to very light/any touch to lumbar and thoracic spine as well as paraspinal musculature, to near tears.   LUMBAR ROM:   AROM eval  Flexion 25% avail *  Extension Barely any movement *  Right lateral flexion Barely any movement *  Left lateral flexion Barely any movement *  Right rotation Just above knee joint *  Left rotation Just above knee joint *   (Blank rows = not tested)   *=pain  LOWER EXTREMITY ROM:     Active  Right eval Left eval  Hip flexion    Hip extension    Hip abduction    Hip adduction    Hip internal rotation    Hip external rotation    Knee flexion    Knee extension    Ankle dorsiflexion    Ankle plantarflexion    Ankle inversion    Ankle eversion     (Blank rows = not tested)  LOWER EXTREMITY MMT:    MMT Right eval Left eval  Hip flexion 3 * 3*  Hip extension    Hip abduction    Hip adduction    Hip internal rotation    Hip external rotation    Knee flexion 3 * 3*  Knee extension 3 * 3*   Ankle dorsiflexion 3+ * 3+ *  Ankle plantarflexion    Ankle inversion    Ankle eversion     (Blank rows = not tested)  LUMBAR SPECIAL TESTS:    FUNCTIONAL TESTS:  5 times sit to stand: 22 STS 2 minute walk test: 332ft 01/06/24   GAIT: Distance walked: 100 Assistive device utilized: None Level of assistance: Complete Independence Comments: Antalgic at times but WNL at times  TREATMENT DATE:  01/06/2024  - : 369ft -STM to lumbar  paraspinals, proximal gluteal origin,QL, and thoracic paraspinals for desensitization of tissue mobility x 15' -Supine Bridge 2 x 10 with 3'' -Supine TA bracing 10x010'' -Pain education science with various tools and techniques to increase activities and endgoneous opioid like effect with cardiovascular activities. -Supine curl up 5x5''   12/23/23: PT Eval and HEP                                                                                                                                 PATIENT EDUCATION:  Education details: PT evaluation, objective findings, POC, Importance of HEP, Precautions, Clinic policie Person educated: Patient Education method: Explanation and Demonstration Education comprehension: verbalized understanding and returned demonstration  HOME EXERCISE PROGRAM: Access Code: [email or web address] URL: [email or web address] Date: 12/23/2023 Prepared by: Rosaria Powell-Butler  Exercises - Supine Transversus Abdominis Bracing - Hands on Stomach  - 2 x daily - 7 x weekly - 3 sets - 10 reps - 10 hold - Decompression exercise 2  - 2 x daily - 7 x weekly - 3 sets - 10 reps, 5 holds - Supine Lower Trunk Rotation  - 2 x daily - 7 x weekly - 3 sets - 10 reps to tolerance  ASSESSMENT:  CLINICAL IMPRESSION: Pt tolerated first treatment session well. Pain at EOS was 5/10. Pt returning understanding pain science and further excited to reduce pain through movement and elevated HR with aerobic activities. Introduced gluteal  strengthening and core activation techniques with no report of pain today.. Patient will benefit from skilled physical therapy in order to address the above deficits and improve QOL/function.    OBJECTIVE IMPAIRMENTS: Abnormal gait, decreased activity tolerance, decreased balance, decreased coordination, decreased endurance, decreased mobility, difficulty walking, decreased ROM, decreased strength, increased muscle spasms, improper body mechanics, postural dysfunction, and pain.   ACTIVITY LIMITATIONS: carrying, lifting, bending, sitting, standing, squatting, sleeping, stairs, transfers, and bed mobility  PARTICIPATION LIMITATIONS: meal prep, cleaning, laundry, and community activity  PERSONAL FACTORS: 3+ comorbidities: Severe Bipolar disorder 1, Delusional Disorder, Chronicity of pain are also affecting patient's functional outcome.   REHAB POTENTIAL: Fair See Personal factors  CLINICAL DECISION MAKING: Evolving/moderate complexity  EVALUATION COMPLEXITY: Moderate   GOALS: Goals reviewed with patient? No  SHORT TERM GOALS: Target date: 01/06/24 Patient will be independent with performance of HEP to demonstrate adequate self management of symptoms.  Baseline:  Goal status: INITIAL  2.   Patient will report at least a 25% improvement with function or pain overall since beginning PT. Baseline:  Goal status: INITIAL   LONG TERM GOALS: Target date: 02/03/24 Patient will improve Oswestry score by  10   points in order to improve self-perceived disability and overall function.  Baseline: Goal status: INITIAL   2.  Patient will improve all LE MMT to at least 4-/5 in order to demonstrate improved strength for functional transfers .  Baseline: Goal status: INITIAL   3.  Patient will improve  5 times sit to  stand  test by 5 seconds  in order to demonstrate improved LE strength and endurance for community ambulation . Baseline:  Goal status: INITIAL  4.  Patient will improve lumbar  flex/ext ROM to at least 50% avail for improved independence with ADL/iADLs.  Baseline:  Goal status: INITIAL   5.  Patient will report overall 50% improvement since beginning PT. Baseline:  Goal status: INITIAL   PLAN:  PT FREQUENCY: 2x/week  PT DURATION: 6 weeks  PLANNED INTERVENTIONS: 97164- PT Re-evaluation, 97110-Therapeutic exercises, 97530- Therapeutic activity, [id number]- Neuromuscular re-education, 97535- Self Care, 02859- Manual therapy, [id number]- Gait training, [phone]- Electrical stimulation (manual), [id number]- Traction (mechanical), [phone] (1-2 muscles), 20561 (3+ muscles)- Dry Needling, Patient/Family education, Balance training, Stair training, Taping, Joint mobilization, Spinal mobilization, Cryotherapy, and Moist heat.  PLAN FOR NEXT SESSION: test, pain science education, progress lumbar ROM to pt tolerance, LE strengthening   3:16 PM, 01/06/24 Omega JONETTA Bottcher PT, DPT [facility] [place] 336 [phone] office

## 2024-01-08 DIAGNOSIS — R079 Chest pain, unspecified: Secondary | ICD-10-CM | POA: Diagnosis not present

## 2024-01-08 DIAGNOSIS — R110 Nausea: Secondary | ICD-10-CM | POA: Diagnosis not present

## 2024-01-08 DIAGNOSIS — Z880 Allergy status to penicillin: Secondary | ICD-10-CM | POA: Diagnosis not present

## 2024-01-08 DIAGNOSIS — Z885 Allergy status to narcotic agent status: Secondary | ICD-10-CM | POA: Diagnosis not present

## 2024-01-08 DIAGNOSIS — I10 Essential (primary) hypertension: Secondary | ICD-10-CM | POA: Diagnosis not present

## 2024-01-08 DIAGNOSIS — F419 Anxiety disorder, unspecified: Secondary | ICD-10-CM | POA: Diagnosis not present

## 2024-01-11 ENCOUNTER — Telehealth (HOSPITAL_COMMUNITY): Payer: Self-pay

## 2024-01-11 ENCOUNTER — Encounter (HOSPITAL_COMMUNITY)

## 2024-01-11 NOTE — Telephone Encounter (Signed)
 Called patient preferred number regarding missed appointment this date. No answer and unable to leave message on voicemail with reminder of next appointment. Of note, patient with recent ED visit, on 01/08/24, for chest pain since last PT treatment.  5:18 PM, 01/11/24 Rosaria Settler, PT, DPT [facility] [place]

## 2024-01-13 ENCOUNTER — Encounter (HOSPITAL_COMMUNITY): Payer: Self-pay

## 2024-01-13 ENCOUNTER — Encounter (HOSPITAL_COMMUNITY)

## 2024-01-13 NOTE — Therapy (Signed)
 [facility] at [facility] [address] [place], [place], [address] Phone: [phone]   Fax:  [phone]  Patient Details  Name: Allison Bolton MRN: [id number] Date of Birth: 1971-04-03 Referring Provider:  No ref. provider found  Encounter Date: 01/13/2024  Pt called regarding no show #2. Unable to leave voicemail at this time.    Omega JONETTA Bottcher, PT 01/13/2024, 2:14 PM  [facility] at [facility] [address] [place], [place], [address] Phone: [phone]   Fax:  [phone]

## 2024-01-14 ENCOUNTER — Encounter (HOSPITAL_COMMUNITY): Payer: Self-pay | Admitting: Emergency Medicine

## 2024-01-14 ENCOUNTER — Other Ambulatory Visit: Payer: Self-pay

## 2024-01-14 ENCOUNTER — Emergency Department (HOSPITAL_COMMUNITY)
Admission: EM | Admit: 2024-01-14 | Discharge: 2024-01-15 | Disposition: A | Discharge status: Other Acute Inpatient Hospital | Attending: Emergency Medicine | Admitting: Emergency Medicine

## 2024-01-14 DIAGNOSIS — F1510 Other stimulant abuse, uncomplicated: Secondary | ICD-10-CM | POA: Insufficient documentation

## 2024-01-14 DIAGNOSIS — F23 Brief psychotic disorder: Secondary | ICD-10-CM | POA: Diagnosis present

## 2024-01-14 DIAGNOSIS — Z8659 Personal history of other mental and behavioral disorders: Secondary | ICD-10-CM | POA: Diagnosis not present

## 2024-01-14 DIAGNOSIS — R457 State of emotional shock and stress, unspecified: Secondary | ICD-10-CM | POA: Diagnosis not present

## 2024-01-14 DIAGNOSIS — Z79899 Other long term (current) drug therapy: Secondary | ICD-10-CM | POA: Insufficient documentation

## 2024-01-14 DIAGNOSIS — F29 Unspecified psychosis not due to a substance or known physiological condition: Secondary | ICD-10-CM | POA: Diagnosis not present

## 2024-01-14 DIAGNOSIS — R000 Tachycardia, unspecified: Secondary | ICD-10-CM | POA: Diagnosis not present

## 2024-01-14 DIAGNOSIS — R17 Unspecified jaundice: Secondary | ICD-10-CM | POA: Diagnosis not present

## 2024-01-14 DIAGNOSIS — R0689 Other abnormalities of breathing: Secondary | ICD-10-CM | POA: Diagnosis not present

## 2024-01-14 LAB — ETHANOL: Alcohol, Ethyl (B): <15 mg/dL (ref <15)

## 2024-01-14 LAB — RAPID URINE DRUG SCREEN, HOSP PERFORMED
Amphetamines: POSITIVE — AB
Barbiturates: NOT DETECTED
Benzodiazepines: POSITIVE — AB
Cocaine: NOT DETECTED
Opiates: NOT DETECTED
Tetrahydrocannabinol: POSITIVE — AB

## 2024-01-14 LAB — CBC WITH DIFFERENTIAL/PLATELET
Abs Immature Granulocytes: 0.04 K/uL (ref 0.00–0.07)
Basophils Absolute: 0.1 K/uL (ref 0.0–0.1)
Basophils Relative: 1 %
Eosinophils Absolute: 0.4 K/uL (ref 0.0–0.5)
Eosinophils Relative: 3 %
HCT: 43.3 % (ref 36.0–46.0)
Hemoglobin: 14 g/dL (ref 12.0–15.0)
Immature Granulocytes: 0 %
Lymphocytes Relative: 38 %
Lymphs Abs: 4.2 K/uL — ABNORMAL HIGH (ref 0.7–4.0)
MCH: 29.7 pg (ref 26.0–34.0)
MCHC: 32.3 g/dL (ref 30.0–36.0)
MCV: 91.7 fL (ref 80.0–100.0)
Monocytes Absolute: 1 K/uL (ref 0.1–1.0)
Monocytes Relative: 9 %
Neutro Abs: 5.4 K/uL (ref 1.7–7.7)
Neutrophils Relative %: 49 %
Platelets: 236 K/uL (ref 150–400)
RBC: 4.72 MIL/uL (ref 3.87–5.11)
RDW: 13.1 % (ref 11.5–15.5)
WBC: 11 K/uL — ABNORMAL HIGH (ref 4.0–10.5)
nRBC: 0 % (ref 0.0–0.2)

## 2024-01-14 LAB — COMPREHENSIVE METABOLIC PANEL WITH GFR
ALT: 26 U/L (ref 0–44)
AST: 22 U/L (ref 15–41)
Albumin: 3.9 g/dL (ref 3.5–5.0)
Alkaline Phosphatase: 58 U/L (ref 38–126)
Anion gap: 12 (ref 5–15)
BUN: 19 mg/dL (ref 6–20)
CO2: 24 mmol/L (ref 22–32)
Calcium: 9.3 mg/dL (ref 8.9–10.3)
Chloride: 103 mmol/L (ref 98–111)
Creatinine, Ser: 0.54 mg/dL (ref 0.44–1.00)
GFR, Estimated: >60 mL/min (ref >60)
Glucose, Bld: 92 mg/dL (ref 70–99)
Potassium: 3.7 mmol/L (ref 3.5–5.1)
Sodium: 139 mmol/L (ref 135–145)
Total Bilirubin: 0.5 mg/dL (ref 0.0–1.2)
Total Protein: 7.5 g/dL (ref 6.5–8.1)

## 2024-01-14 MED ORDER — LORAZEPAM 1 MG PO TABS
2.0000 mg | ORAL_TABLET | Freq: Once | ORAL | Status: AC
  Administered 2024-01-14: 2 mg via ORAL
  Filled 2024-01-14: qty 2

## 2024-01-14 MED ORDER — ZIPRASIDONE HCL 20 MG PO CAPS
20.0000 mg | ORAL_CAPSULE | Freq: Once | ORAL | Status: AC
  Administered 2024-01-14: 20 mg via ORAL
  Filled 2024-01-14: qty 1

## 2024-01-14 NOTE — ED Notes (Signed)
Patient given sandwich and chips. 
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
## 2024-01-14 NOTE — ED Triage Notes (Signed)
 Patient BIB EMS for evaluation of anxiety.  EMS reports patient was hard to get information from.  States patient had rambling speech and state she had every known cancer.  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  Intermittently talking about different family members and how she is tired of this cancer.  When asked about pain she states her heart is broken and that she was recently seen of a heart attack and stroke.  No reports of SI/HI at this time

## 2024-01-14 NOTE — ED Notes (Signed)
 Patient is dressed out in burgundy scrubs and belongings are in locker #7.

## 2024-01-15 ENCOUNTER — Inpatient Hospital Stay
Admission: AD | Admit: 2024-01-15 | Discharge: 2024-02-04 | DRG: 885 | Disposition: A | Discharge status: Home / Self Care | Source: Intra-hospital | Attending: Psychiatry | Admitting: Psychiatry

## 2024-01-15 ENCOUNTER — Encounter: Payer: Self-pay | Admitting: Nurse Practitioner

## 2024-01-15 DIAGNOSIS — F23 Brief psychotic disorder: Secondary | ICD-10-CM | POA: Diagnosis not present

## 2024-01-15 DIAGNOSIS — Z5982 Transportation insecurity: Secondary | ICD-10-CM | POA: Diagnosis not present

## 2024-01-15 DIAGNOSIS — F17210 Nicotine dependence, cigarettes, uncomplicated: Secondary | ICD-10-CM | POA: Diagnosis present

## 2024-01-15 DIAGNOSIS — F1994 Other psychoactive substance use, unspecified with psychoactive substance-induced mood disorder: Secondary | ICD-10-CM | POA: Diagnosis not present

## 2024-01-15 DIAGNOSIS — J45909 Unspecified asthma, uncomplicated: Secondary | ICD-10-CM | POA: Diagnosis present

## 2024-01-15 DIAGNOSIS — Z9049 Acquired absence of other specified parts of digestive tract: Secondary | ICD-10-CM

## 2024-01-15 DIAGNOSIS — Z8541 Personal history of malignant neoplasm of cervix uteri: Secondary | ICD-10-CM | POA: Diagnosis not present

## 2024-01-15 DIAGNOSIS — M797 Fibromyalgia: Secondary | ICD-10-CM | POA: Diagnosis present

## 2024-01-15 DIAGNOSIS — Z803 Family history of malignant neoplasm of breast: Secondary | ICD-10-CM

## 2024-01-15 DIAGNOSIS — E119 Type 2 diabetes mellitus without complications: Secondary | ICD-10-CM | POA: Diagnosis not present

## 2024-01-15 DIAGNOSIS — Z635 Disruption of family by separation and divorce: Secondary | ICD-10-CM

## 2024-01-15 DIAGNOSIS — Z5941 Food insecurity: Secondary | ICD-10-CM | POA: Diagnosis not present

## 2024-01-15 DIAGNOSIS — F19959 Other psychoactive substance use, unspecified with psychoactive substance-induced psychotic disorder, unspecified: Secondary | ICD-10-CM | POA: Diagnosis not present

## 2024-01-15 DIAGNOSIS — F419 Anxiety disorder, unspecified: Secondary | ICD-10-CM | POA: Diagnosis not present

## 2024-01-15 DIAGNOSIS — Z79899 Other long term (current) drug therapy: Secondary | ICD-10-CM | POA: Diagnosis not present

## 2024-01-15 DIAGNOSIS — F319 Bipolar disorder, unspecified: Principal | ICD-10-CM | POA: Diagnosis present

## 2024-01-15 DIAGNOSIS — F259 Schizoaffective disorder, unspecified: Secondary | ICD-10-CM | POA: Diagnosis not present

## 2024-01-15 DIAGNOSIS — Z800 Family history of malignant neoplasm of digestive organs: Secondary | ICD-10-CM | POA: Diagnosis not present

## 2024-01-15 DIAGNOSIS — Z9151 Personal history of suicidal behavior: Secondary | ICD-10-CM

## 2024-01-15 DIAGNOSIS — Z8349 Family history of other endocrine, nutritional and metabolic diseases: Secondary | ICD-10-CM

## 2024-01-15 DIAGNOSIS — Z90710 Acquired absence of both cervix and uterus: Secondary | ICD-10-CM | POA: Diagnosis not present

## 2024-01-15 MED ORDER — DIPHENHYDRAMINE HCL 25 MG PO CAPS
50.0000 mg | ORAL_CAPSULE | Freq: Three times a day (TID) | ORAL | Status: DC | PRN
  Administered 2024-01-19 – 2024-02-03 (×9): 50 mg via ORAL
  Filled 2024-01-15 (×9): qty 2

## 2024-01-15 MED ORDER — ACETAMINOPHEN 325 MG PO TABS
650.0000 mg | ORAL_TABLET | Freq: Four times a day (QID) | ORAL | Status: DC | PRN
  Administered 2024-01-15 – 2024-02-04 (×42): 650 mg via ORAL
  Filled 2024-01-15 (×45): qty 2

## 2024-01-15 MED ORDER — LORAZEPAM 2 MG/ML IJ SOLN
2.0000 mg | Freq: Three times a day (TID) | INTRAMUSCULAR | Status: DC | PRN
  Administered 2024-01-15 – 2024-01-26 (×5): 2 mg via INTRAMUSCULAR
  Filled 2024-01-15: qty 1

## 2024-01-15 MED ORDER — LORAZEPAM 1 MG PO TABS
2.0000 mg | ORAL_TABLET | Freq: Once | ORAL | Status: AC
  Administered 2024-01-15: 2 mg via ORAL
  Filled 2024-01-15: qty 2

## 2024-01-15 MED ORDER — HALOPERIDOL LACTATE 5 MG/ML IJ SOLN
10.0000 mg | Freq: Three times a day (TID) | INTRAMUSCULAR | Status: DC | PRN
  Administered 2024-01-15 – 2024-01-20 (×2): 10 mg via INTRAMUSCULAR
  Filled 2024-01-15 (×2): qty 2

## 2024-01-15 MED ORDER — DIPHENHYDRAMINE HCL 50 MG/ML IJ SOLN
50.0000 mg | Freq: Three times a day (TID) | INTRAMUSCULAR | Status: DC | PRN
  Administered 2024-01-15 – 2024-01-20 (×2): 50 mg via INTRAMUSCULAR
  Filled 2024-01-15 (×3): qty 1

## 2024-01-15 MED ORDER — NICOTINE POLACRILEX 2 MG MT GUM
2.0000 mg | CHEWING_GUM | OROMUCOSAL | Status: DC | PRN
  Administered 2024-01-15 – 2024-02-02 (×24): 2 mg via ORAL
  Filled 2024-01-15 (×22): qty 1

## 2024-01-15 MED ORDER — MAGNESIUM HYDROXIDE 400 MG/5ML PO SUSP: 30.0000 mL | Freq: Every day | ORAL | Status: DC | PRN

## 2024-01-15 MED ORDER — TRAZODONE HCL 50 MG PO TABS
50.0000 mg | ORAL_TABLET | Freq: Every evening | ORAL | Status: DC | PRN
  Administered 2024-01-15 – 2024-02-03 (×19): 50 mg via ORAL
  Filled 2024-01-15 (×20): qty 1

## 2024-01-15 MED ORDER — HALOPERIDOL 5 MG PO TABS
5.0000 mg | ORAL_TABLET | Freq: Three times a day (TID) | ORAL | Status: DC | PRN
  Administered 2024-01-19 – 2024-01-25 (×6): 5 mg via ORAL
  Filled 2024-01-15 (×6): qty 1

## 2024-01-15 MED ORDER — DIPHENHYDRAMINE HCL 50 MG/ML IJ SOLN
50.0000 mg | Freq: Three times a day (TID) | INTRAMUSCULAR | Status: DC | PRN
  Administered 2024-01-24: 50 mg via INTRAMUSCULAR

## 2024-01-15 MED ORDER — ALUM & MAG HYDROXIDE-SIMETH 200-200-20 MG/5ML PO SUSP
30.0000 mL | ORAL | Status: DC | PRN
  Administered 2024-01-15 – 2024-02-03 (×14): 30 mL via ORAL
  Filled 2024-01-15 (×13): qty 30

## 2024-01-15 MED ORDER — ONDANSETRON 8 MG PO TBDP
8.0000 mg | ORAL_TABLET | Freq: Once | ORAL | Status: AC
  Administered 2024-01-15: 8 mg via ORAL
  Filled 2024-01-15: qty 1

## 2024-01-15 MED ORDER — LORAZEPAM 2 MG/ML IJ SOLN
2.0000 mg | Freq: Three times a day (TID) | INTRAMUSCULAR | Status: DC | PRN
  Administered 2024-01-24: 2 mg via INTRAMUSCULAR
  Filled 2024-01-15 (×5): qty 1

## 2024-01-15 MED ORDER — HYDROXYZINE HCL 25 MG PO TABS
25.0000 mg | ORAL_TABLET | Freq: Three times a day (TID) | ORAL | Status: DC | PRN
  Administered 2024-01-16 – 2024-02-03 (×25): 25 mg via ORAL
  Filled 2024-01-15 (×25): qty 1

## 2024-01-15 MED ORDER — HALOPERIDOL LACTATE 5 MG/ML IJ SOLN
5.0000 mg | Freq: Three times a day (TID) | INTRAMUSCULAR | Status: DC | PRN
  Administered 2024-01-24: 5 mg via INTRAMUSCULAR
  Filled 2024-01-15: qty 1

## 2024-01-15 MED ORDER — NICOTINE 14 MG/24HR TD PT24
14.0000 mg | MEDICATED_PATCH | Freq: Every day | TRANSDERMAL | Status: DC
  Administered 2024-01-15 – 2024-02-03 (×19): 14 mg via TRANSDERMAL
  Filled 2024-01-15 (×20): qty 1

## 2024-01-15 NOTE — ED Notes (Signed)
 Pt ate her breakfast tray and stated that she is ready to go to the next hospital. Pt is requesting coffee. Pt now laying down with eyes closed.
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
## 2024-01-15 NOTE — ED Provider Notes (Addendum)
  Provider Note MRN:  [id number]  Arrival date & time: 01/15/24    ED Course and Medical Decision Making  Assumed care of patient at sign-out or upon transfer.  Psychosis likely from drug use plan is for allowing time for metabolization and reassessment.  7 AM update: Patient seems a bit more conversant but still with tangential speech, labile emotions.  TTS recommending inpatient management.  Patient would like to go voluntarily.  Has a bed in [place].  Procedures  Final Clinical Impressions(s) / ED Diagnoses     ICD-10-CM   1. Methamphetamine use (HCC)  F15.10     2. Acute psychosis [facility])  F23       ED Discharge Orders     None       Discharge Instructions   None      Ozell HERO. Theadore, MD [facility] Emergency Medicine [facility] [email or web address]    Theadore Ozell HERO, MD 01/15/24 [id number]    Theadore Ozell HERO, MD 01/15/24 [id number]

## 2024-01-15 NOTE — ED Notes (Signed)
 Pt has been accepted at [facility] for acute psychosis.

## 2024-01-15 NOTE — Plan of Care (Signed)
   Problem: Education: Goal: Knowledge of [place] [facility] Education information/materials will improve Outcome: Not Progressing Goal: Emotional status will improve Outcome: Not Progressing Goal: Mental status will improve Outcome: Not Progressing Goal: Verbalization of understanding the information provided will improve Outcome: Not Progressing

## 2024-01-15 NOTE — Progress Notes (Signed)
 Report received from [place], [place] in [facility] ED

## 2024-01-15 NOTE — Group Note (Signed)
 Date:  01/15/2024 Time:  9:30 PM  Group Topic/Focus:  Early Warning Signs:   The focus of this group is to help patients identify signs or symptoms they exhibit before slipping into an unhealthy state or crisis. Self Care:   The focus of this group is to help patients understand the importance of self-care in order to improve or restore emotional, physical, spiritual, interpersonal, and financial health.    Participation Level:  Active  Participation Quality:  Appropriate  Affect:  Appropriate  Cognitive:  Appropriate  Insight: Appropriate  Engagement in Group:  Engaged  Modes of Intervention:  Discussion and Support  Additional Comments:  N/A  Allison Bolton 01/15/2024, 9:30 PM

## 2024-01-15 NOTE — Tx Team (Signed)
 Initial Treatment Plan 01/15/2024 1:04 PM Allison Bolton FMW:[id number]    PATIENT STRESSORS: Financial difficulties   Health problems   Occupational concerns   Substance abuse   Traumatic event     PATIENT STRENGTHS: Motivation for treatment/growth  Supportive family/friends    PATIENT IDENTIFIED PROBLEMS: Homelessness  Anxiety  Substance Use                 DISCHARGE CRITERIA:  Ability to meet basic life and health needs Adequate post-discharge living arrangements Improved stabilization in mood, thinking, and/or behavior Medical problems require only outpatient monitoring Motivation to continue treatment in a less acute level of care Need for constant or close observation no longer present Reduction of life-threatening or endangering symptoms to within safe limits Safe-care adequate arrangements made Verbal commitment to aftercare and medication compliance  PRELIMINARY DISCHARGE PLAN: Attend aftercare/continuing care group Outpatient therapy Placement in alternative living arrangements  PATIENT/FAMILY INVOLVEMENT: This treatment plan has been presented to and reviewed with the patient, Allison Bolton.  The patient has been given the opportunity to ask questions and make suggestions.  Allison JONETTA Birmingham, RN 01/15/2024, 1:04 PM

## 2024-01-15 NOTE — Progress Notes (Signed)
 Patient arrived on the unit voluntarily from [facility] ED at 12 pm. Patient alert and oriented. Denies SI, HI, AVH and pain at this time. The patient's skin was assessed and was found to be within the normal daily limits. No open wounds, cuts, or bruises noted. Routine safety checks initiated every 15 minutes. Patient verbally contracts for safety at this time. Patient rights and responsibilities have been explained and given to the patient to keep in her room. The patient will contact her support system on her own. The patient is now oriented to the unit and the unit rules.   01/15/24 1200  Psych Admission Type (Psych Patients Only)  Admission Status Voluntary  Psychosocial Assessment  Patient Complaints Anxiety  Eye Contact Fair  Facial Expression Animated  Affect Anxious;Preoccupied  Speech Pressured  Interaction Assertive  Motor Activity Fidgety  Appearance/Hygiene Disheveled;In scrubs  Behavior Characteristics Cooperative;Anxious;Fidgety  Mood Preoccupied;Anxious  Aggressive Behavior  Effect No apparent injury  Thought Marine scientist others;Delusions;Preoccupation  Delusions Persecutory  Perception WDL  Hallucination None reported or observed  Judgment Impaired  Confusion None  Danger to Self  Current suicidal ideation? Denies  Danger to Others  Danger to Others None reported or observed

## 2024-01-15 NOTE — BH Assessment (Signed)
 Comprehensive Clinical Assessment (CCA) Note  01/15/2024 Allison Bolton [id number]  Disposition: Allison Olp, NP, recommends inpatient treatment. AC at [facility] to review for placement. Disposition SW to secure placement if needed.  The patient demonstrates the following risk factors for suicide: Chronic risk factors for suicide include: psychiatric disorder of bipolar, substance use disorder, and previous suicide attempts 2007 slit wrist. Acute risk factors for suicide include: mental status. Protective factors for this patient include: positive therapeutic relationship, responsibility to others (children, family), and hope for the future. Considering these factors, the overall suicide risk at this point appears to be high. Patient is not appropriate for outpatient follow up.   Chief Complaint:  Chief Complaint  Patient presents with   Mental Health Problem   Visit Diagnosis:  Hx of bipolar   Allison Bolton is a 53 year old female presenting as a voluntary walk-in to [facility] due to anxiety. Patient denied SI, HI, psychosis and alcohol/drug usage. Patient has history of bipolar. Patient rambles and does not complete sentences. Patient seems to have flight of ideas at times. Patient is poor historian at times and mumbles answers.   Per ED triage note 01/14/24, Patient BIB EMS for evaluation of anxiety. EMS reports patient was hard to get information from. States patient had rambling speech and state she had every known cancer. 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 Intermittently talking about different family members and how she is tired of this cancer. When asked about pain she states her heart is broken and that she was recently seen of a heart attack and stroke. No reports of SI/HI at this time.   Per EDP note 01/14/24, the patient tells me I cannot see, I cannot hear, I want my family, I want to eat, I want to go to a nursing home, all of my kids are gay and so I, somebody married a black  guy, my heart is broken, I think I have had a heart attack and a stroke at the same time, it happened after eating a hot dog, she cannot maintain a full sentence without being distracted by other comments.   Patient states I want to die of cancer, I want to go to sleep and not wake up in the morning. Patient then starts to ramble. Patient then states :I don't want to kill myself because I have grandkids. Patient reports attempted suicide of slitting wrist in 2007. Patient reports having hallucinations, however patient continued to ramble and unable to understand patients speech. Patient reports worsening depressive symptoms. Patient reports normal sleep.   Patient is currently being seen by Allison Bolton. Patient reports receiving medication management, patient rambled medications, unable to understand.   Patient gave consent to speak with Allison Bolton [phone]. TTS Clinician attempted to contact, phone went to voicemail.    CCA Screening, Triage and Referral (STR)  Patient Reported Information How did you hear about us ? Family/Friend  What Is the Reason for Your Visit/Call Today? Mental Health Evaluation and Anxiety  How Long Has This Been Causing You Problems? -- Allison Bolton)  What Do You Feel Would Help You the Most Today? Treatment for Depression or other mood problem   Have You Recently Had Any Thoughts About Hurting Yourself? No  Are You Planning to Commit Suicide/Harm Yourself At This time? No   Flowsheet Row ED from 01/14/2024 in [facility] Emergency Department at [facility] Admission (Discharged) from 12/25/2023 in [facility] ED from 09/27/2023 in [facility] Emergency Department at [facility]  C-SSRS RISK CATEGORY No Risk No Risk No Risk    Have you Recently Had Thoughts About Hurting Someone Allison Bolton? No  Are You Planning to Harm Someone at This Time? No  Explanation: n/a   Have You Used Any Alcohol or Drugs in the Past 24 Hours? -- Allison Bolton)  How  Long Ago Did You Use Drugs or Alcohol? N/a What Did You Use and How Much? N/a  Do You Currently Have a Therapist/Psychiatrist? Yes  Name of Therapist/Psychiatrist: Name of Therapist/Psychiatrist: [facility]   Have You Been Recently Discharged From Any Office Practice or Programs? No  Explanation of Discharge From Practice/Program: n/a    CCA Screening Triage Referral Assessment Type of Contact: Face-to-Face  Telemedicine Service Delivery:  n/a  Is this Initial or Reassessment?  N/a Date Telepsych consult ordered in [facility]:   N/a Time Telepsych consult ordered in [facility]:   N/a Location of Assessment: [facility]  Provider Location: [facility]   Collateral Involvement: Allison Bolton [phone]   Does Patient Have a Court Appointed Legal Guardian? No  Legal Guardian Contact Information: n/a  Copy of Legal Guardianship Form: -- (n/a)  Legal Guardian Notified of Arrival: -- (n/a)  Legal Guardian Notified of Pending Discharge: -- (n/a)  If Minor and Not Living with Parent(s), Who has Custody? n/a  Is CPS involved or ever been involved? -- (uta)  Is APS involved or ever been involved? -- Allison Bolton)   Patient Determined To Be At Risk for Harm To Self or Others Based on Review of Patient Reported Information or Presenting Complaint? Yes, for Self-Harm  Method: No Plan  Availability of Means: No access or NA  Intent: Vague intent or NA  Notification Required: No need or identified person  Additional Information for Danger to Others Potential: -- Allison Bolton)  Additional Comments for Danger to Others Potential: uta  Are There Guns or Other Weapons in Your Home? No  Types of Guns/Weapons: n/a  Are These Weapons Safely Secured?                            -- (n/a)  Who Could Verify You Are Able To Have These Secured: n/a  Do You Have any Outstanding Charges, Pending Court Dates, Parole/Probation? uta  Contacted To Inform of Risk of Harm To Self  or Others: Unable to Contact:    Does Patient Present under Involuntary Commitment? No    [place] of Residence: [place]   Patient Currently Receiving the Following Services: Medication Management   Determination of Need: Urgent (48 hours)   Options For Referral: Medication Management; Inpatient Hospitalization; Outpatient Therapy     CCA Biopsychosocial Patient Reported Schizophrenia/Schizoaffective Diagnosis in Past: No   Strengths: uta   Mental Health Symptoms Depression:  Hopelessness; Fatigue   Duration of Depressive symptoms: Duration of Depressive Symptoms: -- Allison Bolton)   Mania:  Racing thoughts   Anxiety:   Difficulty concentrating   Psychosis:  Hallucinations   Duration of Psychotic symptoms: Duration of Psychotic Symptoms: -- Allison Bolton)   Trauma:  -- Allison Bolton)   Obsessions:  -- Allison Bolton)   Compulsions:  -- Allison Bolton)   Inattention:  -- Allison Bolton)   Hyperactivity/Impulsivity:  -- Allison Bolton)   Oppositional/Defiant Behaviors:  -- Allison Bolton)   Emotional Irregularity:  -- Allison Bolton)   Other Mood/Personality Symptoms:  uta    Mental Status Exam Appearance and self-care  Stature:  Average   Weight:  Average weight   Clothing:  Neat/clean   Grooming:  Normal   Cosmetic use:  None   Posture/gait:  Normal   Motor activity:  Not Remarkable   Sensorium  Attention:  Confused; Distractible   Concentration:  Scattered; Preoccupied   Orientation:  -- Allison Bolton)   Recall/memory:  Defective in Immediate; Defective in Short-term; Defective in Recent; Defective in Remote   Affect and Mood  Affect:  Depressed   Mood:  Depressed   Relating  Eye contact:  Avoided (patient was sleepy)   Facial expression:  Sad; Depressed; Responsive   Attitude toward examiner:  Cooperative   Thought and Language  Speech flow: Slow   Thought content:  Delusions   Preoccupation:  None   Hallucinations:  None   Organization:  Coherent   [employer] of Knowledge:  Average    Intelligence:  Average   Abstraction:  Normal   Judgement:  Poor   Reality Testing:  Unaware   Insight:  Lacking   Decision Making:  Impulsive   Social Functioning  Social Maturity:  Impulsive   Social Judgement:  impulsive  Stress  Stressors:  -- Industrial/product designer)   Coping Ability:  -- Allison Bolton)   Skill Deficits:  balance  Supports:  family    Religion: Religion/Spirituality Are You A Religious Person?:  (uta) How Might This Affect Treatment?: uta  Leisure/Recreation: Leisure / Recreation Do You Have Hobbies?:  Allison Bolton)  Exercise/Diet: Exercise/Diet Do You Exercise?:  (uta) Have You Gained or Lost A Significant Amount of Weight in the Past Six Months?:  (uta) Do You Follow a Special Diet?:  (uta) Do You Have Any Trouble Sleeping?: No   CCA Employment/Education Employment/Work Situation: Employment / Work Situation Employment Situation: On disability Why is Patient on Disability: uta How Long has Patient Been on Disability: uta Patient's Job has Been Impacted by Current Illness: No Has Patient ever Been in the [employer]?: No  Education: Education Is Patient Currently Attending School?: No Last Grade Completed:  (n/a) Did You Attend College?:  (uta) Did You Have An Individualized Education Program (IIEP):  Allison Bolton) Did You Have Any Difficulty At School?:  Allison Bolton) Patient's Education Has Been Impacted by Current Illness:  (uta)   CCA Family/Childhood History Family and Relationship History: Family history Marital status: Single Does patient have children?: Yes How many children?:  (uta) How is patient's relationship with their children?: good  Childhood History:  Childhood History By whom was/is the patient raised?: Other (Comment) (uta) Did patient suffer any verbal/emotional/physical/sexual abuse as a child?:  (uta) Did patient suffer from severe childhood neglect?:  (uta) Has patient ever been sexually abused/assaulted/raped as an adolescent or adult?:  (uta) Was  the patient ever a victim of a crime or a disaster?:  (uta) Witnessed domestic violence?:  (uta) Has patient been affected by domestic violence as an adult?:  (uta)   CCA Substance Use Alcohol/Drug Use: Alcohol / Drug Use Pain Medications: Please see MAR Prescriptions: Please see MAR Over the Counter: Please see MAR History of alcohol / drug use?: No history of alcohol / drug abuse Longest period of sobriety (when/how long): Pt denies SA Negative Consequences of Use:  (n/a) Withdrawal Symptoms:  (n/a)     ASAM's:  Six Dimensions of Multidimensional Assessment  Dimension 1:  Acute Intoxication and/or Withdrawal Potential:   Dimension 1:  Description of individual's past and current experiences of substance use and withdrawal: n/a  Dimension 2:  Biomedical Conditions and Complications:   Dimension 2:  Description of patient's biomedical  conditions and  complications: n/a  Dimension 3:  Emotional, Behavioral, or Cognitive Conditions and Complications:  Dimension 3:  Description of emotional, behavioral, or cognitive conditions and complications: n/a  Dimension 4:  Readiness to Change:  Dimension 4:  Description of Readiness to Change criteria: n/a  Dimension 5:  Relapse, Continued use, or Continued Problem Potential:  Dimension 5:  Relapse, continued use, or continued problem potential critiera description: n/a  Dimension 6:  Recovery/Living Environment:  Dimension 6:  Recovery/Iiving environment criteria description: n/a  ASAM Severity Score:    ASAM Recommended Level of Treatment: ASAM Recommended Level of Treatment:  (n/a)   Substance use Disorder (SUD) Substance Use Disorder (SUD)  Checklist Symptoms of Substance Use:  (n/a)  Recommendations for Services/Supports/Treatments: Recommendations for Services/Supports/Treatments Recommendations For Services/Supports/Treatments: Inpatient Hospitalization, Individual Therapy, Medication Management  Disposition Recommendation per  psychiatric provider:  Recommends inpatient psychiatric treatment.   DSM5 Diagnoses: Patient Active Problem List   Diagnosis Date Noted   Rectal bleeding 12/25/2023   IBS (irritable bowel syndrome) 11/05/2023   Elevated TSH    Bipolar I disorder, current or most recent episode manic, with psychotic features (HCC)    Delusional disorder (HCC) 05/10/2019   Chest pain 07/18/2013   Severe bipolar I disorder, current or most recent episode depressed (HCC) 05/13/2013   Smoking 05/01/2012   Generalized anxiety disorder 06/27/2011   Liver lesion 02/04/2011     Referrals to Alternative Service(s): Referred to Alternative Service(s):   Place:   Date:   Time:    Referred to Alternative Service(s):   Place:   Date:   Time:    Referred to Alternative Service(s):   Place:   Date:   Time:    Referred to Alternative Service(s):   Place:   Date:   Time:     Allison Bolton, [facility]

## 2024-01-15 NOTE — Group Note (Signed)
 Date:  01/15/2024 Time:  7:08 PM  Group Topic/Focus:  Spirituality:   The focus of this group is to discuss how one's spirituality can aide in recovery.    Participation Level:  Did Not Attend   Camellia HERO Tata Timmins 01/15/2024, 7:08 PM

## 2024-01-15 NOTE — Plan of Care (Signed)
  Problem: Education: Goal: Verbalization of understanding the information provided will improve Outcome: Progressing   Problem: Activity: Goal: Interest or engagement in activities will improve Outcome: Progressing Goal: Sleeping patterns will improve Outcome: Progressing   Problem: Coping: Goal: Ability to verbalize frustrations and anger appropriately will improve Outcome: Progressing Goal: Ability to demonstrate self-control will improve Outcome: Progressing   Problem: Health Behavior/Discharge Planning: Goal: Compliance with treatment plan for underlying cause of condition will improve Outcome: Progressing   Problem: Education: Goal: Emotional status will improve Outcome: Not Progressing Goal: Mental status will improve Outcome: Not Progressing

## 2024-01-16 DIAGNOSIS — F1994 Other psychoactive substance use, unspecified with psychoactive substance-induced mood disorder: Secondary | ICD-10-CM | POA: Diagnosis present

## 2024-01-16 DIAGNOSIS — F19959 Other psychoactive substance use, unspecified with psychoactive substance-induced psychotic disorder, unspecified: Principal | ICD-10-CM | POA: Diagnosis present

## 2024-01-16 MED ORDER — ONDANSETRON 4 MG PO TBDP
4.0000 mg | ORAL_TABLET | Freq: Three times a day (TID) | ORAL | Status: DC | PRN
  Administered 2024-01-16 – 2024-02-03 (×20): 4 mg via ORAL
  Filled 2024-01-16 (×22): qty 1

## 2024-01-16 MED ORDER — OLANZAPINE 5 MG PO TABS
5.0000 mg | ORAL_TABLET | Freq: Every day | ORAL | Status: DC
  Administered 2024-01-16 – 2024-01-17 (×2): 5 mg via ORAL
  Filled 2024-01-16 (×2): qty 1

## 2024-01-16 NOTE — Progress Notes (Signed)
   01/16/24 0900  Psych Admission Type (Psych Patients Only)  Admission Status Voluntary  Psychosocial Assessment  Patient Complaints Anxiety  Eye Contact Darting  Facial Expression Animated  Affect Anxious  Speech Pressured  Interaction Assertive  Motor Activity Fidgety  Appearance/Hygiene In scrubs;Disheveled  Behavior Characteristics Anxious;Fidgety  Mood Preoccupied  Thought Chartered certified accountant of ideas  Content Preoccupation  Delusions Persecutory  Perception WDL  Hallucination None reported or observed  Judgment Impaired  Confusion None  Danger to Self  Current suicidal ideation? Denies

## 2024-01-16 NOTE — Group Note (Signed)
 Date:  01/16/2024 Time:  9:13 PM  Group Topic/Focus:  Wrap-Up Group:   The focus of this group is to help patients review their daily goal of treatment and discuss progress on daily workbooks.    Participation Level:  Active  Participation Quality:  Appropriate and Resistant  Affect:  Appropriate and Tearful  Cognitive:  Alert and Lacking  Insight: Appropriate and Limited  Engagement in Group:  Limited and Resistant  Modes of Intervention:  Discussion  Additional Comments:     Maglione,Hidaya Daniel E 01/16/2024, 9:13 PM

## 2024-01-16 NOTE — Group Note (Signed)
 Date:  01/16/2024 Time:  1:19 PM  Group Topic/Focus:  Goals Group:   The focus of this group is to help patients establish daily goals to achieve during treatment and discuss how the patient can incorporate goal setting into their daily lives to aide in recovery.    Participation Level:  Active  Participation Quality:  Appropriate  Affect:  Appropriate  Cognitive:  Appropriate  Insight: Appropriate  Engagement in Group:  Engaged  Modes of Intervention:  Activity  Additional Comments:    Camellia HERO Naisha Wisdom 01/16/2024, 1:19 PM

## 2024-01-16 NOTE — H&P (Signed)
 Psychiatric Admission Assessment Adult  Patient Identification: Allison Bolton MRN:  [id number] Date of Evaluation:  01/16/2024 Chief Complaint:  Bipolar disorder (HCC) [F31.9]   History of Present Illness:  53 year old female with medical history of asthma, fibromyalgia, diabetes mellitus, ovarian and cervical cancer, possible colon cancer, and Mnire's disease, as well as psychiatric history of bipolar disorder and polysubstance abuse, presented to the emergency department with bizarre and chaotic behavior, gross disorganization, and positive urine drug screen for benzodiazepines, amphetamines, and THC. She was administered Geodon  and Ativan  in the ED.  In today's inpatient assessment, patient presents as nonsensical and highly disorganized, with severely impaired thought process and affective lability. She provides a fragmented and inconsistent history, stating she has had inpatient care in [place], [place] , though unable to provide dates or specifics. She becomes hyper-focused on race, stating repeatedly, "My dad is Black. I'm mixed. You know I'm mixed. Don't act like you don't." She expresses anger toward her grandchildren, stating she is upset that "they're all mixed race."  She reports being married three times and begins to cry spontaneously. She shares that she has outstanding legal charges in [facility], though cannot elaborate further. Despite her disorganization, she endorses taking olanzapine  twice daily in the past, which she identifies as helpful. She denies suicidal and homicidal ideation, hallucinations, paranoia, and delusions, although her behavior and cognition are clearly impaired.  It is the clinical judgment of this provider that in her current disorganized and unpredictable mental state, the patient remains a danger to herself or others without inpatient psychiatric monitoring. She lacks insight and judgment and is unable to provide reliable self-report.  The  treatment plan includes monitoring to sobriety, assessing for resolution of symptoms, and initiating antipsychotic support with olanzapine  (Zyprexa ) 5 mg PO BID for agitation, mood lability, and possible psychosis.  Social History Unavailable today due to disorganized thought process. Per chart review: History of housing instability Suicide attempt in 2007 ED visit for suicidal ideation in October 2024 Legal charges pending in [facility]  Psychiatric History Diagnosed with bipolar disorder Past suicide attempt (2007) Prior ED presentation for SI (04/15/2023) History of inpatient treatment in [place], [place] (per patient) Previous use of olanzapine  (Zyprexa ) Current behavior consistent with acute decompensation, likely substance related  Substance Abuse History UDS positive for benzodiazepines, amphetamines, and THC BAL: Negative History of polysubstance use disorder Further assessment pending clinical stabilization and detox    Total Time spent with patient: 1 hour Sleep  Sleep:No data recorded Past Psychiatric History: Is the patient at risk to self? Yes.    Has the patient been a risk to self in the past 6 months? Yes.    Has the patient been a risk to self within the distant past? Yes.    Is the patient a risk to others? No.  Has the patient been a risk to others in the past 6 months? No.  Has the patient been a risk to others within the distant past? No.   [place] Scale:  Flowsheet Row Admission (Current) from 01/15/2024 in [facility] INPATIENT BEH[facility] MEDICINE ED from 01/14/2024 in [facility] Emergency Department at [facility] Admission (Discharged) from 12/25/2023 in [facility]  C-SSRS RISK CATEGORY No Risk No Risk No Risk     Past Medical History:  Past Medical History:  Diagnosis Date   Arthritis    Asthma    Bipolar 1 disorder (HCC)    Diverticulosis    Dysrhythmia    sts I have heart palpitations  Fibromyalgia    H/O hiatal hernia    4    Headache(784.0)    High blood pressure    Meniere's disease    S/P colonoscopy    Dr. Golda 2010: few small diverticula at sigmoid. otherwise normal.    Shortness of breath     Past Surgical History:  Procedure Laterality Date   ABDOMINAL HYSTERECTOMY     APPENDECTOMY     CESAREAN SECTION     CHOLECYSTECTOMY     COLONOSCOPY N/A 12/25/2023   Procedure: COLONOSCOPY;  Surgeon: Eartha Angelia Sieving, MD;  Location: [facility];  Service: Gastroenterology;  Laterality: N/A;  11:30am, asa 1   exploratory laparoscopy     FOOT SURGERY     HEMORRHOID SURGERY     LAPAROSCOPIC APPENDECTOMY  02/19/2011   Procedure: APPENDECTOMY LAPAROSCOPIC;  Surgeon: Oneil DELENA Budge;  Location: [facility] ORS;  Service: General;  Laterality: N/A;   LAPAROSCOPY  02/19/2011   Procedure: LAPAROSCOPY DIAGNOSTIC;  Surgeon: Oneil DELENA Budge;  Location: [facility] ORS;  Service: General;  Laterality: N/A;   left ovarian removal     multiple hernia repairs     Right ovarian removal     TONSILLECTOMY     TONSILLECTOMY AND ADENOIDECTOMY     tubes in ears     UMBILICAL HERNIA REPAIR  Dec 2011   Dr. Budge   wisdom teeth removal     Family History:  Family History  Problem Relation Age of Onset   Thyroid  disease Mother    Colon cancer Father    Breast cancer Maternal Aunt    Colon cancer Brother    Colon cancer Other        paternal and maternal grandfather   Meniere's disease Other    Anesthesia problems Neg Hx    Hypotension Neg Hx    Malignant hyperthermia Neg Hx    Pseudochol deficiency Neg Hx     Social History:  Social History   Substance and Sexual Activity  Alcohol Use No   Comment: not since June 2012     Social History   Substance and Sexual Activity  Drug Use No   Comment: patient denies any-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      Allergies:   Allergies  Allergen Reactions   Amoxicillin Dermatitis, Rash and Other (See Comments)    Patient states she feels sunburnt when taking  amoxicillin    Tramadol Rash and Dermatitis    Per pt   Amoxicillin-Pot Clavulanate Dermatitis    Sunburn-like painful rash   Lab Results:  Results for orders placed or performed during the hospital encounter of 01/14/24 (from the past 48 hours)  Urine rapid drug screen (hosp performed)     Status: Abnormal   Collection Time: 01/14/24  9:39 PM  Result Value Ref Range   Opiates NONE DETECTED NONE DETECTED   Cocaine NONE DETECTED NONE DETECTED   Benzodiazepines POSITIVE (A) NONE DETECTED   Amphetamines POSITIVE (A) NONE DETECTED    Comment: (NOTE) Trazodone  is metabolized in vivo to several metabolites, including pharmacologically active m-CPP, which is excreted in the urine. Immunoassay screens for amphetamines and MDMA have potential cross-reactivity with these compounds and may provide false positive  results.     Tetrahydrocannabinol POSITIVE (A) NONE DETECTED   Barbiturates NONE DETECTED NONE DETECTED    Comment: (NOTE) DRUG SCREEN FOR MEDICAL PURPOSES ONLY.  IF CONFIRMATION IS NEEDED FOR ANY PURPOSE, NOTIFY LAB WITHIN 5 DAYS.  LOWEST DETECTABLE  LIMITS FOR URINE DRUG SCREEN Drug Class                     Cutoff (ng/mL) Amphetamine and metabolites    1000 Barbiturate and metabolites    200 Benzodiazepine                 200 Opiates and metabolites        300 Cocaine and metabolites        300 THC                            50 Performed at [facility], [address], [place], [place] [address]   Comprehensive metabolic panel     Status: None   Collection Time: 01/14/24  9:44 PM  Result Value Ref Range   Sodium 139 135 - 145 mmol/L   Potassium 3.7 3.5 - 5.1 mmol/L   Chloride 103 98 - 111 mmol/L   CO2 24 22 - 32 mmol/L   Glucose, Bld 92 70 - 99 mg/dL    Comment: Glucose reference range applies only to samples taken after fasting for at least 8 hours.   BUN 19 6 - 20 mg/dL   Creatinine, Ser 9.45 0.44 - 1.00 mg/dL   Calcium 9.3 8.9 - 89.6 mg/dL   Total Protein 7.5  6.5 - 8.1 g/dL   Albumin 3.9 3.5 - 5.0 g/dL   AST 22 15 - 41 U/L   ALT 26 0 - 44 U/L   Alkaline Phosphatase 58 38 - 126 U/L   Total Bilirubin 0.5 0.0 - 1.2 mg/dL   GFR, Estimated >39 >39 mL/min    Comment: (NOTE) Calculated using the CKD-EPI Creatinine Equation (2021)    Anion gap 12 5 - 15    Comment: Performed at [facility], [address], [place], [place] [address]  Ethanol     Status: None   Collection Time: 01/14/24  9:44 PM  Result Value Ref Range   Alcohol, Ethyl (B) <15 <15 mg/dL    Comment: (NOTE) For medical purposes only. Performed at [facility], [address] [place], [place] [address]   CBC with Diff     Status: Abnormal   Collection Time: 01/14/24  9:44 PM  Result Value Ref Range   WBC 11.0 (H) 4.0 - 10.5 K/uL   RBC 4.72 3.87 - 5.11 MIL/uL   Hemoglobin 14.0 12.0 - 15.0 g/dL   HCT 56.6 63.9 - 53.9 %   MCV 91.7 80.0 - 100.0 fL   MCH 29.7 26.0 - 34.0 pg   MCHC 32.3 30.0 - 36.0 g/dL   RDW 86.8 88.4 - 84.4 %   Platelets 236 150 - 400 K/uL    Comment: SPECIMEN CHECKED FOR CLOTS REPEATED TO VERIFY PLATELET COUNT CONFIRMED BY SMEAR    nRBC 0.0 0.0 - 0.2 %   Neutrophils Relative % 49 %   Neutro Abs 5.4 1.7 - 7.7 K/uL   Lymphocytes Relative 38 %   Lymphs Abs 4.2 (H) 0.7 - 4.0 K/uL   Monocytes Relative 9 %   Monocytes Absolute 1.0 0.1 - 1.0 K/uL   Eosinophils Relative 3 %   Eosinophils Absolute 0.4 0.0 - 0.5 K/uL   Basophils Relative 1 %   Basophils Absolute 0.1 0.0 - 0.1 K/uL   WBC Morphology MORPHOLOGY UNREMARKABLE    RBC Morphology MORPHOLOGY UNREMARKABLE    Smear Review Reviewed    Immature Granulocytes 0 %  Abs Immature Granulocytes 0.04 0.00 - 0.07 K/uL    Comment: Performed at [facility], [address] [place], [place] [address]    Blood Alcohol level:  Lab Results  Component Value Date   [facility] <15 01/14/2024   ETH <10 05/07/2019    Metabolic Disorder Labs:  Lab Results  Component Value Date   HGBA1C 5.3 05/11/2019   MPG 105.41  05/11/2019   MPG 114 05/13/2013   Lab Results  Component Value Date   PROLACTIN 24.1 (H) 05/11/2019   Lab Results  Component Value Date   CHOL 139 05/11/2019   TRIG 66 05/11/2019   HDL 44 05/11/2019   CHOLHDL 3.2 05/11/2019   VLDL 13 05/11/2019   LDLCALC 82 05/11/2019   LDLCALC 88 05/13/2013    Current Medications: Current Facility-Administered Medications  Medication Dose Route Frequency Provider Last Rate Last Admin   acetaminophen  (TYLENOL ) tablet 650 mg  650 mg Oral Q6H PRN Bobbitt, Shalon E, NP   650 mg at 01/16/24 0836   alum & mag hydroxide-simeth (MAALOX/MYLANTA) 200-200-20 MG/5ML suspension 30 mL  30 mL Oral Q4H PRN Bobbitt, Shalon E, NP   30 mL at 01/15/24 1750   haloperidol  (HALDOL ) tablet 5 mg  5 mg Oral TID PRN Bobbitt, Shalon E, NP       And   diphenhydrAMINE  (BENADRYL ) capsule 50 mg  50 mg Oral TID PRN Bobbitt, Shalon E, NP       haloperidol  lactate (HALDOL ) injection 5 mg  5 mg Intramuscular TID PRN Bobbitt, Shalon E, NP       And   diphenhydrAMINE  (BENADRYL ) injection 50 mg  50 mg Intramuscular TID PRN Bobbitt, Shalon E, NP       And   LORazepam  (ATIVAN ) injection 2 mg  2 mg Intramuscular TID PRN Bobbitt, Shalon E, NP       haloperidol  lactate (HALDOL ) injection 10 mg  10 mg Intramuscular TID PRN Bobbitt, Shalon E, NP   10 mg at 01/15/24 2138   And   diphenhydrAMINE  (BENADRYL ) injection 50 mg  50 mg Intramuscular TID PRN Bobbitt, Shalon E, NP   50 mg at 01/15/24 2137   And   LORazepam  (ATIVAN ) injection 2 mg  2 mg Intramuscular TID PRN Bobbitt, Shalon E, NP   2 mg at 01/15/24 2137   hydrOXYzine  (ATARAX ) tablet 25 mg  25 mg Oral TID PRN Bobbitt, Shalon E, NP   25 mg at 01/16/24 0836   magnesium  hydroxide (MILK OF MAGNESIA) suspension 30 mL  30 mL Oral Daily PRN Bobbitt, Shalon E, NP       nicotine  (NICODERM CQ  - dosed in mg/24 hours) patch 14 mg  14 mg Transdermal Daily Jadapalle, Sree, MD   14 mg at 01/16/24 9164   nicotine  polacrilex (NICORETTE ) gum 2 mg  2 mg  Oral PRN Jadapalle, Sree, MD   2 mg at 01/15/24 1250   OLANZapine  (ZYPREXA ) tablet 5 mg  5 mg Oral QHS Cleotilde Hoy HERO, NP       ondansetron  (ZOFRAN -ODT) disintegrating tablet 4 mg  4 mg Oral Q8H PRN Cleotilde Hoy HERO, NP       traZODone  (DESYREL ) tablet 50 mg  50 mg Oral QHS PRN Bobbitt, Shalon E, NP   50 mg at 01/15/24 2133   PTA Medications: Medications Prior to Admission  Medication Sig Dispense Refill Last Dose/Taking   acetaminophen  (TYLENOL ) 325 MG tablet Take 650 mg by mouth every 6 (six) hours as needed.  albuterol  (VENTOLIN  HFA) 108 (90 Base) MCG/ACT inhaler Inhale 2 puffs into the lungs every 4 (four) hours as needed for wheezing or shortness of breath.      benztropine  (COGENTIN ) 1 MG tablet Take 1 tablet (1 mg total) by mouth 2 (two) times daily. 60 tablet 2    carvedilol  (COREG ) 25 MG tablet Take 1 tablet (25 mg total) by mouth 2 (two) times daily with a meal. 60 tablet 3    divalproex  (DEPAKOTE ) 250 MG DR tablet Take 3 tablets (750 mg total) by mouth every evening. 90 tablet 2    gabapentin  (NEURONTIN ) 100 MG capsule Take 100 mg by mouth 3 (three) times daily.      hydrOXYzine  (ATARAX ) 25 MG tablet Take 25 mg by mouth every 8 (eight) hours as needed for anxiety.      linaclotide  (LINZESS ) 72 MCG capsule Take 1 capsule (72 mcg total) by mouth daily before breakfast. 90 capsule 3    losartan (COZAAR) 25 MG tablet Take 25 mg by mouth daily.      OLANZapine  (ZYPREXA ) 10 MG tablet Take 10 mg by mouth at bedtime.      ondansetron  (ZOFRAN ) 4 MG tablet Take 4 mg by mouth every 6 (six) hours as needed.      ondansetron  (ZOFRAN -ODT) 4 MG disintegrating tablet Take 4 mg by mouth every 8 (eight) hours as needed.      QUEtiapine  (SEROQUEL ) 300 MG tablet Take 300 mg by mouth at bedtime.      tamsulosin (FLOMAX) 0.4 MG CAPS capsule Take 0.4 mg by mouth daily.      traZODone  (DESYREL ) 100 MG tablet Take 2 tablets (200 mg total) by mouth at bedtime as needed for sleep. 60 tablet 2      Psychiatric Specialty Exam: Appearance: Disheveled, wearing mismatched clothing, chaotic motor behavior Eye contact: Poor Speech: Pressured, tangential, often nonsensical Mood: Labile Affect: Inappropriate Thought process: Disorganized, tangential, lacking goal direction Thought content: Difficult to assess; psychosis suspected Hallucinations: Not clearly endorsed Delusions: Unable to assess Insight: Poor Judgment: Impaired Orientation: Likely not fully oriented Attention: Severely impaired Sleep: Unknown Appetite: Unknown Psychomotor: Restless, hyperactive Memory, concentration, recall: Not assessable Fund of knowledge, language: Not assessable Suicidal ideation: Denied, but unreliable Homicidal ideation: Denied, but unreliable   Musculoskeletal: Strength & Muscle Tone: within normal limits Gait & Station: normal  Physical Exam: Physical Exam Constitutional:      Appearance: Normal appearance.  HENT:     Head: Normocephalic and atraumatic.  Pulmonary:     Effort: Pulmonary effort is normal.  Musculoskeletal:     Cervical back: Normal range of motion.  Neurological:     Mental Status: She is alert.    ROS Blood pressure (!) 125/104, pulse 87, temperature (!) 97.4 F (36.3 C), temperature source Temporal, resp. rate 18, height 5' 6 (1.676 m), weight 77.1 kg, SpO2 98%. Body mass index is 27.44 kg/m.  Principal Diagnosis: Psychoactive substance-induced psychosis (HCC) Diagnosis:  Principal Problem:   Psychoactive substance-induced psychosis (HCC) Active Problems:   Bipolar disorder (HCC)   Substance induced mood disorder (HCC)  53 year old female with a complex medical and psychiatric history including bipolar disorder, substance abuse, and multiple medical comorbidities, presents with acute psychiatric decompensation, marked by severe disorganization, labile mood, and chaotic behavior, in the setting of polysubstance use.  She was administered Geodon  and  Ativan  in the ED for agitation. Urine drug screen was positive for benzodiazepines, amphetamines, and THC. Her clinical presentation is consistent with substance-induced psychosis and  mood disturbance, though a background of chronic mental illness (bipolar disorder or schizophrenia spectrum) remains possible.  She is currently presenting with disorganized in the context of poly substance abuse. Will monitor til sobriety and assess presentation ongoing making appropriate changes. She remains a danger to herself or others due to cognitive and behavioral instability.  Safety and Monitoring:             -- Voluntary admission to inpatient psychiatric unit for safety, stabilization and treatment             -- Daily contact with patient to assess and evaluate symptoms and progress in treatment             -- Patient's case to be discussed in multi-disciplinary team meeting             -- Observation Level: q15 minute checks             -- Vital signs:  q12 hours             -- Precautions: suicide, elopement, and assault   2. 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 Polysubstance Use Disorder (benzodiazepines, amphetamines, cannabis) Rule out Bipolar I Disorder Rule out Schizophrenia Spectrum or Schizoaffective Disorder  Psychiatric Plan / Interventions: Admit to inpatient setting for continued monitoring and stabilization Begin olanzapine  5 mg BID to address agitation, mood lability, and disorganization Monitor for resolution of symptoms as substances metabolize Reassess for enduring psychotic or mood symptoms once sobriety is achieved Evaluate for capacity and diagnostic clarification as organization improves  Substance Use: Monitor for withdrawal symptoms Provide psychoeducation on psychiatric consequences of substance use Refer to outpatient addiction services at discharge  Medication Olanzapine  (Zyprexa ) 5 mg PO BID    -- The risks/benefits/side-effects/alternatives to this medication were discussed in detail with the patient and time was given for questions. The patient consents to medication trial.                -- Metabolic profile and EKG monitoring obtained while on an atypical antipsychotic (BMI: Lipid Panel: HbgA1c: QTc:)              -- Encouraged patient to participate in unit milieu and in scheduled group therapies                            3. Medical Issues Being Addressed: no acute needs at this time, medically cleared in ED for psychiatric care     4. Discharge Planning:              -- Social work and case management to assist with discharge planning and identification of hospital follow-up needs prior to discharge             -- Estimated LOS: 5-7 days             -- Discharge Concerns: Need to establish a safety plan; Medication compliance and effectiveness             -- Discharge Goals: Return home with outpatient referrals follow ups  Physician Treatment Plan for Primary Diagnosis: Psychoactive substance-induced psychosis (HCC) Long Term Goal(s): Improvement in symptoms so as ready for discharge  Short Term Goals: Ability to identify changes in lifestyle to reduce recurrence of condition will improve  Physician Treatment Plan for Secondary Diagnosis: Principal Problem:   Psychoactive substance-induced psychosis (HCC) Active Problems:   Bipolar disorder (HCC)   Substance  induced mood disorder (HCC)  Long Term Goal(s): Improvement in symptoms so as ready for discharge  Short Term Goals: Ability to identify changes in lifestyle to reduce recurrence of condition will improve  I certify that inpatient services furnished can reasonably be expected to improve the patient's condition.    Hoy CHRISTELLA Pinal, NP 7/12/20252:00 PM

## 2024-01-16 NOTE — Group Note (Signed)
 Date:  01/16/2024 Time:  5:11 PM  Group Topic/Focus:  Activity Group: The focus of this group is to promote activity for the patients and encourage them to go outside to the courtyard to get some exercise and some fresh air.    Participation Level:  Did Not Attend   Allison Bolton 01/16/2024, 5:11 PM

## 2024-01-16 NOTE — Plan of Care (Signed)
  Problem: Education: Goal: Knowledge of [place] [facility] Education information/materials will improve 01/16/2024 1544 by Marty Eleanor NOVAK, RN Outcome: Not Progressing 01/16/2024 1544 by Marty Eleanor NOVAK, RN Outcome: Progressing Goal: Emotional status will improve Outcome: Not Progressing

## 2024-01-16 NOTE — [facility] Suicide Risk Assessment (Addendum)
 [facility] INPATIENT:  Family/Significant Other Suicide Prevention Education  Suicide Prevention Education:  Contact Attempts:  Larnell Carota, uncle, [phone]  , has been identified by the patient as the family member/significant other with whom the patient will be residing, and identified as the person(s) who will aid the patient in the event of a mental health crisis.  With written consent from the patient, two attempts were made to provide suicide prevention education, prior to and/or following the patient's discharge.  We were unsuccessful in providing suicide prevention education.  A suicide education pamphlet was given to the patient to share with family/significant other.  Date and time of first attempt: 01/16/24 at 11:15 am Date and time of second attempt: needed   Roselyn GORMAN Lento 01/16/2024, 11:16 AM
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
## 2024-01-17 NOTE — Plan of Care (Signed)
  Problem: Activity: Goal: Sleeping patterns will improve Outcome: Progressing   Problem: Coping: Goal: Ability to demonstrate self-control will improve Outcome: Progressing   Problem: Health Behavior/Discharge Planning: Goal: Compliance with treatment plan for underlying cause of condition will improve Outcome: Progressing

## 2024-01-17 NOTE — Plan of Care (Signed)
  Problem: Education: Goal: Verbalization of understanding the information provided will improve Outcome: Progressing   Problem: Education: Goal: Mental status will improve Outcome: Progressing   Problem: Education: Goal: Emotional status will improve Outcome: Progressing   Problem: Education: Goal: Knowledge of [facility] Education information/materials will improve Outcome: Progressing

## 2024-01-17 NOTE — Progress Notes (Signed)
   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 Denies  Danger to Others  Danger to Others None reported or observed   Patient is alert and oriented x 4, thoughts are linear, affect is congruent with mood, interacting appropriately with peers and staff, dhr denies SI/HI/AVH, 15 miutes safety checks maintained.

## 2024-01-17 NOTE — Group Note (Signed)
 Date:  01/17/2024 Time:  10:45 PM  Group Topic/Focus:  Wrap-Up Group:   The focus of this group is to help patients review their daily goal of treatment and discuss progress on daily workbooks.    Participation Level:  Minimal  Participation Quality:  Inattentive, Redirectable, and Resistant  Affect:  Appropriate and Flat  Cognitive:  Alert, Delusional, and Lacking  Insight: Limited  Engagement in Group:  Limited, Off Topic, and Poor  Modes of Intervention:  Discussion  Additional Comments:     Maglione,Keegen Heffern E 01/17/2024, 10:45 PM

## 2024-01-17 NOTE — Progress Notes (Signed)
 Patient presents disorganized and anxious. Patient stated she slept ok last night and denies SI,HI, and A/V/H and states its mostly just her anxiety. Patient received hydroxyzine  po prn and zofran  earlier this morning for anxiety and nausea which provided some relief. Patient also received tylenol  po prn due to neck pain. Patient attended group and remains redirectable in milieu at this time.

## 2024-01-17 NOTE — Progress Notes (Signed)
 [facility] MD Progress Note  01/17/2024 12:41 PM Allison Bolton  MRN:  [id number]  53 year old female with medical history of asthma, fibromyalgia, diabetes mellitus, ovarian and cervical cancer, possible colon cancer, and Mnire's disease, as well as psychiatric history of bipolar disorder and polysubstance abuse, presented to the emergency department with bizarre and chaotic behavior, gross disorganization, and positive urine drug screen for benzodiazepines, amphetamines, and THC. She was administered Geodon  and Ativan  in the ED.   Subjective:  Chart reviewed, case discussed in multidisciplinary meeting, patient seen during rounds.   Patient seen today for follow-up psychiatric evaluation. She is noted sitting in the dayroom with peers. States she slept last night. Reports presenting to the ED due to "dealing with too much stress." She describes being homeless in [place], [place] for the past five years and states, "I got really messed up." She also mentions, "I'm looking for my kids. they are all mixed, I don't wanna talk about it," and adds randomly, "I voted for Trump." She denies auditory or visual hallucinations and denies medication side effects. Less irritable today. Still presents with disorganized thought content. Note ongoing disorganization and poor reliability of history. Though less irritable and more cooperative today, patient remains cognitively and behaviorally unstable, presenting risk to self or others. Symptoms appear driven by recent substance use; diagnostic clarity remains limited. Continued observation and treatment during early sobriety is necessary to evaluate for underlying bipolar disorder or primary psychotic illness.  Sleep: Good  Appetite:  Good  Past Psychiatric History: see h&P Family History:  Family History  Problem Relation Age of Onset   Thyroid  disease Mother    Colon cancer Father    Breast cancer Maternal Aunt    Colon cancer Brother    Colon cancer Other         paternal and maternal grandfather   Meniere's disease Other    Anesthesia problems Neg Hx    Hypotension Neg Hx    Malignant hyperthermia Neg Hx    Pseudochol deficiency Neg Hx    Social History:  Social History   Substance and Sexual Activity  Alcohol Use No   Comment: not since June 2012     Social History   Substance and Sexual Activity  Drug Use No   Comment: patient denies any-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    Social History   Socioeconomic History   Marital status: Legally Separated    Spouse name: Not on file   Number of children: Not on file   Years of education: Not on file   Highest education level: Not on file  Occupational History   Not on file  Tobacco Use   Smoking status: Every Day    Current packs/day: 0.00    Average packs/day: 0.5 packs/day for 37.3 years (18.7 ttl pk-yrs)    Types: Cigarettes    Start date: 07/07/1981    Last attempt to quit: 11/05/2018    Years since quitting: 5.2   Smokeless tobacco: Never  Vaping Use   Vaping status: Never Used  Substance and Sexual Activity   Alcohol use: No    Comment: not since June 2012   Drug use: No    Comment: patient denies any-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   Sexual activity: Never    Birth control/protection: Surgical  Other Topics Concern   Not on file  Social History Narrative   Not on file   Social Drivers of Health  Financial Resource Strain: Not on file  Food Insecurity: Food Insecurity Present (01/15/2024)   Hunger Vital Sign    Worried About Running Out of Food in the Last Year: Often true    Ran Out of Food in the Last Year: Often true  Transportation Needs: Unmet Transportation Needs (01/15/2024)   PRAPARE - Administrator, Civil Service (Medical): Yes    Lack of Transportation (Non-Medical): Yes  Physical Activity: Not on file  Stress: Not on file  Social Connections: Unknown (10/14/2021)   Received from [facility]   Social  Connections    Do your friends and family support you?: Not on file    What agencies support you?: Not on file   Past Medical History:  Past Medical History:  Diagnosis Date   Arthritis    Asthma    Bipolar 1 disorder (HCC)    Diverticulosis    Dysrhythmia    sts I have heart palpitations   Fibromyalgia    H/O hiatal hernia    4   Headache(784.0)    High blood pressure    Meniere's disease    S/P colonoscopy    Dr. Golda 2010: few small diverticula at sigmoid. otherwise normal.    Shortness of breath     Past Surgical History:  Procedure Laterality Date   ABDOMINAL HYSTERECTOMY     APPENDECTOMY     CESAREAN SECTION     CHOLECYSTECTOMY     COLONOSCOPY N/A 12/25/2023   Procedure: COLONOSCOPY;  Surgeon: Eartha Angelia Sieving, MD;  Location[facility];  Service: Gastroenterology;  Laterality: N/A;  11:30am, asa 1   exploratory laparoscopy     FOOT SURGERY     HEMORRHOID SURGERY     LAPAROSCOPIC APPENDECTOMY  02/19/2011   Procedure: APPENDECTOMY LAPAROSCOPIC;  Surgeon: Oneil DELENA Budge;  Location: [facility];  Service: General;  Laterality: N/A;   LAPAROSCOPY  02/19/2011   Procedure: LAPAROSCOPY DIAGNOSTIC;  Surgeon: Oneil DELENA Budge;  Location: [facility];  Service: General;  Laterality: N/A;   left ovarian removal     multiple hernia repairs     Right ovarian removal     TONSILLECTOMY     TONSILLECTOMY AND ADENOIDECTOMY     tubes in ears     UMBILICAL HERNIA REPAIR  Dec 2011   Dr. Budge   wisdom teeth removal      Current Medications: Current Facility-Administered Medications  Medication Dose Route Frequency Provider Last Rate Last Admin   acetaminophen  (TYLENOL ) tablet 650 mg  650 mg Oral Q6H PRN Bobbitt, Shalon E, NP   650 mg at 01/16/24 2308   alum & mag hydroxide-simeth (MAALOX/MYLANTA) 200-200-20 MG/5ML suspension 30 mL  30 mL Oral Q4H PRN Bobbitt, Shalon E, NP   30 mL at 01/15/24 1750   haloperidol  (HALDOL ) tablet 5 mg  5 mg Oral TID PRN Bobbitt, Shalon E, NP        And   diphenhydrAMINE  (BENADRYL ) capsule 50 mg  50 mg Oral TID PRN Bobbitt, Shalon E, NP       haloperidol  lactate (HALDOL ) injection 5 mg  5 mg Intramuscular TID PRN Bobbitt, Shalon E, NP       And   diphenhydrAMINE  (BENADRYL ) injection 50 mg  50 mg Intramuscular TID PRN Bobbitt, Shalon E, NP       And   LORazepam  (ATIVAN ) injection 2 mg  2 mg Intramuscular TID PRN Bobbitt, Shalon E, NP       haloperidol   lactate (HALDOL ) injection 10 mg  10 mg Intramuscular TID PRN Bobbitt, Shalon E, NP   10 mg at 01/15/24 2138   And   diphenhydrAMINE  (BENADRYL ) injection 50 mg  50 mg Intramuscular TID PRN Bobbitt, Shalon E, NP   50 mg at 01/15/24 2137   And   LORazepam  (ATIVAN ) injection 2 mg  2 mg Intramuscular TID PRN Bobbitt, Shalon E, NP   2 mg at 01/15/24 2137   hydrOXYzine  (ATARAX ) tablet 25 mg  25 mg Oral TID PRN Bobbitt, Shalon E, NP   25 mg at 01/17/24 0843   magnesium  hydroxide (MILK OF MAGNESIA) suspension 30 mL  30 mL Oral Daily PRN Bobbitt, Shalon E, NP       nicotine  (NICODERM CQ  - dosed in mg/24 hours) patch 14 mg  14 mg Transdermal Daily Jadapalle, Sree, MD   14 mg at 01/17/24 0843   nicotine  polacrilex (NICORETTE ) gum 2 mg  2 mg Oral PRN Jadapalle, Sree, MD   2 mg at 01/15/24 1250   OLANZapine  (ZYPREXA ) tablet 5 mg  5 mg Oral QHS Cleotilde Hoy HERO, NP   5 mg at 01/16/24 2100   ondansetron  (ZOFRAN -ODT) disintegrating tablet 4 mg  4 mg Oral Q8H PRN Cleotilde Hoy HERO, NP   4 mg at 01/17/24 0843   traZODone  (DESYREL ) tablet 50 mg  50 mg Oral QHS PRN Bobbitt, Shalon E, NP   50 mg at 01/16/24 2130    Lab Results: No results found for this or any previous visit (from the past 48 hours).  Blood Alcohol level:  Lab Results  Component Value Date   [facility] <15 01/14/2024   ETH <10 05/07/2019    Metabolic Disorder Labs: Lab Results  Component Value Date   HGBA1C 5.3 05/11/2019   MPG 105.41 05/11/2019   MPG 114 05/13/2013   Lab Results  Component Value Date   PROLACTIN 24.1 (H)  05/11/2019   Lab Results  Component Value Date   CHOL 139 05/11/2019   TRIG 66 05/11/2019   HDL 44 05/11/2019   CHOLHDL 3.2 05/11/2019   VLDL 13 05/11/2019   LDLCALC 82 05/11/2019   LDLCALC 88 05/13/2013     Psychiatric Specialty Exam:  Presentation  General Appearance: Disheveled  Eye Contact:Fair  Speech:Normal Rate; Clear and Coherent  Speech Volume:Decreased    Mood and Affect  Mood:Anxious; Depressed  Affect:Flat   Thought Process  Thought Processes:Disorganized  Descriptions of Associations:Circumstantial  Orientation:Full (Time, Place and Person)  Thought Content:Obsessions; Rumination  Hallucinations:Hallucinations: None  Ideas of Reference:Paranoia; Percusatory  Suicidal Thoughts:Suicidal Thoughts: No  Homicidal Thoughts:Homicidal Thoughts: No   Sensorium  Memory:No data recorded Judgment:Poor  Insight:Poor   Executive Functions  Concentration:Fair  Attention Span:Poor  Recall:Good  Fund of Knowledge:Good  Language:Good   Psychomotor Activity  Psychomotor Activity:Psychomotor Activity: Normal  Musculoskeletal: Strength & Muscle Tone: within normal limits Gait & Station: normal Assets  Assets:No data recorded   Physical Exam: Physical Exam ROS Blood pressure 124/86, pulse 77, temperature (!) 97.2 F (36.2 C), resp. rate 18, height 5' 6 (1.676 m), weight 77.1 kg, SpO2 96%. Body mass index is 27.44 kg/m.  Diagnosis: Principal Problem:   Psychoactive substance-induced psychosis (HCC) Active Problems:   Bipolar disorder (HCC)   Substance induced mood disorder (HCC)   PLAN: Safety and Monitoring:  -- Voluntary admission to inpatient psychiatric unit for safety, stabilization and treatment  -- Daily contact with patient to assess and evaluate symptoms and progress in treatment  -- Patient's case  to be discussed in multi-disciplinary team meeting  -- Observation Level : q15 minute checks  -- Vital signs:  q12  hours  -- Precautions: suicide, elopement, and assault -- Encouraged patient to participate in unit milieu and in scheduled group therapies  2. 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 Polysubstance Use Disorder (benzodiazepines, amphetamines, cannabis) Rule out Bipolar I Disorder Rule out Schizophrenia Spectrum or Schizoaffective Disorder   Admit to inpatient setting for continued monitoring and stabilization Begin olanzapine  5 mg BID to address agitation, mood lability, and disorganization Monitor for resolution of symptoms as substances metabolize Reassess for enduring psychotic or mood symptoms once sobriety is achieved Evaluate for capacity and diagnostic clarification as organization improves  Substance Use: Monitor for withdrawal symptoms Provide psychoeducation on psychiatric consequences of substance use Refer to outpatient addiction services at discharge   Medication Olanzapine  (Zyprexa ) 5 mg PO BID   -- The risks/benefits/side-effects/alternatives to this medication were discussed in detail with the patient and time was given for questions. The patient consents to medication trial.                -- Metabolic profile and EKG monitoring obtained while on an atypical antipsychotic (BMI: Lipid Panel: HbgA1c: QTc:)              -- Encouraged patient to participate in unit milieu and in scheduled group therapies                            3. Medical Issues Being Addressed: no acute needs at this time, medically cleared in ED for psychiatric care     4. Discharge Planning:   -- Social work and case management to assist with discharge planning and identification of hospital follow-up needs prior to discharge  -- Estimated LOS: 3-4 days  Hoy CHRISTELLA Pinal, NP 01/17/2024, 12:41 PM

## 2024-01-17 NOTE — Group Note (Signed)
 LCSW Group Therapy Note  Group Date: 01/17/2024 Start Time: 1300 End Time: 1400   Type of Therapy and Topic:  Group Therapy: Using I Statements  Participation Level:  Active  Description of Group:  Patients were asked to provide details of some interpersonal conflicts they have experienced. Patients were then educated about "I" statements, communication which focuses on feelings or views of the speaker rather than what the other person is doing. T group members were asked to reflect on past conflicts and to provide specific examples for utilizing "I" statements.  Therapeutic Goals:  Patients will verbalize understanding of ineffective communication and effective communication. Patients will be able to empathize with whom they are having conflict. Patients will practice effective communication in the form of "I" statements.    Summary of Patient Progress:  Patient shared openly. The patient was present and active throughout the session and proved open to feedback from CSW and peers. Patient demonstrated proficient insight into the subject matter, was respectful of peers, and was present throughout the entire session.  Therapeutic Modalities:   Cognitive Behavioral Therapy Solution-Focused Therapy    Alveta CHRISTELLA Kerns, LCSW 01/17/2024  2:19 PM

## 2024-01-17 NOTE — Group Note (Signed)
 Date:  01/17/2024 Time:  12:01 PM  Group Topic/Focus:  Managing Feelings:   The focus of this group is to identify what feelings patients have difficulty handling and develop a plan to handle them in a healthier way upon discharge.    Participation Level:  Active  Participation Quality:  Appropriate  Affect:  Appropriate  Cognitive:  Appropriate  Insight: Appropriate  Engagement in Group:  Engaged  Modes of Intervention:  Activity  Additional Comments:    Allison Bolton 01/17/2024, 12:01 PM

## 2024-01-17 NOTE — [facility] Suicide Risk Assessment (Signed)
 [facility] INPATIENT:  Family/Significant Other Suicide Prevention Education  Suicide Prevention Education:  Contact Attempts:  Larnell Carota, uncle, [phone]  , has been identified by the patient as the family member/significant other with whom the patient will be residing, and identified as the person(s) who will aid the patient in the event of a mental health crisis.  With written consent from the patient, two attempts were made to provide suicide prevention education, prior to and/or following the patient's discharge.  We were unsuccessful in providing suicide prevention education.  A suicide education pamphlet was given to the patient to share with family/significant other.   Date and time of first attempt: 01/16/24 at 11:15 am  Date and time of second attempt: 01/17/24 at 3:28 PM.   SPE completed with pt, after unsuccessful contact attempts. SPI pamphlet provided to pt and pt was encouraged to share information with support network, ask questions, and talk about any concerns relating to SPE. Pt denies access to guns/firearms and verbalized understanding of information provided. Mobile Crisis information also provided to pt.     Davin Archuletta, MSW, LCSWA 01/17/2024 3:29 PM

## 2024-01-18 ENCOUNTER — Encounter: Payer: Self-pay | Admitting: Nurse Practitioner

## 2024-01-18 DIAGNOSIS — F19959 Other psychoactive substance use, unspecified with psychoactive substance-induced psychotic disorder, unspecified: Secondary | ICD-10-CM | POA: Diagnosis not present

## 2024-01-18 DIAGNOSIS — F319 Bipolar disorder, unspecified: Principal | ICD-10-CM

## 2024-01-18 DIAGNOSIS — F1994 Other psychoactive substance use, unspecified with psychoactive substance-induced mood disorder: Secondary | ICD-10-CM | POA: Diagnosis not present

## 2024-01-18 LAB — HEMOGLOBIN A1C
Hgb A1c MFr Bld: 5.1 % (ref 4.8–5.6)
Mean Plasma Glucose: 99.67 mg/dL

## 2024-01-18 LAB — LIPID PANEL
Cholesterol: 154 mg/dL (ref 0–200)
HDL: 49 mg/dL (ref >40)
LDL Cholesterol: 69 mg/dL (ref 0–99)
Total CHOL/HDL Ratio: 3.1 ratio
Triglycerides: 180 mg/dL — ABNORMAL HIGH (ref <150)
VLDL: 36 mg/dL (ref 0–40)

## 2024-01-18 MED ORDER — OLANZAPINE 10 MG PO TABS
10.0000 mg | ORAL_TABLET | Freq: Every day | ORAL | Status: DC
  Administered 2024-01-18: 10 mg via ORAL
  Filled 2024-01-18: qty 1

## 2024-01-18 MED ORDER — OLANZAPINE 5 MG PO TABS
5.0000 mg | ORAL_TABLET | Freq: Every day | ORAL | Status: DC
  Administered 2024-01-19: 5 mg via ORAL
  Filled 2024-01-18: qty 1

## 2024-01-18 NOTE — Group Note (Signed)
 Recreation Therapy Group Note   Group Topic:Health and Wellness  Group Date: 01/18/2024 Start Time: 1040 End Time: 1130 Facilitators: Celestia Jeoffrey BRAVO, LRT, CTRS Location: Courtyard  Group Description: [employer]. LRT and patients played games of basketball, drew with chalk, and played corn hole while outside in the courtyard while getting fresh air and sunlight. Music was being played in the background. LRT and peers conversed about different games they have played before, what they do in their free time and anything else that is on their minds. LRT encouraged pts to drink water  after being outside, sweating and getting their heart rate up.  Goal Area(s) Addressed: Patient will build on frustration tolerance skills. Patients will partake in a competitive play game with peers. Patients will gain knowledge of new leisure interest/hobby.    Affect/Mood: Appropriate   Participation Level: Active   Participation Quality: Independent   Behavior: Restless   Speech/Thought Process: Loose association   Insight: Fair   Judgement: Fair    Modes of Intervention: Activity   Patient Response to Interventions:  Receptive   Education Outcome:  In group clarification offered    Clinical Observations/Individualized Feedback: [place] was active in their participation of session activities and group discussion. Pt interacted well with LRT and peers duration of session.    Plan: Continue to engage patient in RT group sessions 2-3x/week.   Jeoffrey BRAVO Celestia, LRT, CTRS 01/18/2024 1:01 PM

## 2024-01-18 NOTE — [facility] IP Treatment Plan (Signed)
 Interdisciplinary Treatment and Diagnostic Plan Update  01/18/2024 Time of Session: 10:18 Allison Bolton MRN: [id number]  Principal Diagnosis: Psychoactive substance-induced psychosis (HCC)  Secondary Diagnoses: Principal Problem:   Psychoactive substance-induced psychosis (HCC) Active Problems:   Bipolar disorder (HCC)   Substance induced mood disorder (HCC)   Current Medications:  Current Facility-Administered Medications  Medication Dose Route Frequency Provider Last Rate Last Admin   acetaminophen  (TYLENOL ) tablet 650 mg  650 mg Oral Q6H PRN Bobbitt, Shalon E, NP   650 mg at 01/18/24 0631   alum & mag hydroxide-simeth (MAALOX/MYLANTA) 200-200-20 MG/5ML suspension 30 mL  30 mL Oral Q4H PRN Bobbitt, Shalon E, NP   30 mL at 01/15/24 1750   haloperidol  (HALDOL ) tablet 5 mg  5 mg Oral TID PRN Bobbitt, Shalon E, NP       And   diphenhydrAMINE  (BENADRYL ) capsule 50 mg  50 mg Oral TID PRN Bobbitt, Shalon E, NP       haloperidol  lactate (HALDOL ) injection 5 mg  5 mg Intramuscular TID PRN Bobbitt, Shalon E, NP       And   diphenhydrAMINE  (BENADRYL ) injection 50 mg  50 mg Intramuscular TID PRN Bobbitt, Shalon E, NP       And   LORazepam  (ATIVAN ) injection 2 mg  2 mg Intramuscular TID PRN Bobbitt, Shalon E, NP       haloperidol  lactate (HALDOL ) injection 10 mg  10 mg Intramuscular TID PRN Bobbitt, Shalon E, NP   10 mg at 01/15/24 2138   And   diphenhydrAMINE  (BENADRYL ) injection 50 mg  50 mg Intramuscular TID PRN Bobbitt, Shalon E, NP   50 mg at 01/15/24 2137   And   LORazepam  (ATIVAN ) injection 2 mg  2 mg Intramuscular TID PRN Bobbitt, Shalon E, NP   2 mg at 01/15/24 2137   hydrOXYzine  (ATARAX ) tablet 25 mg  25 mg Oral TID PRN Bobbitt, Shalon E, NP   25 mg at 01/18/24 0853   magnesium  hydroxide (MILK OF MAGNESIA) suspension 30 mL  30 mL Oral Daily PRN Bobbitt, Shalon E, NP       nicotine  (NICODERM CQ  - dosed in mg/24 hours) patch 14 mg  14 mg Transdermal Daily Jadapalle, Sree, MD   14  mg at 01/18/24 9146   nicotine  polacrilex (NICORETTE ) gum 2 mg  2 mg Oral PRN Jadapalle, Sree, MD   2 mg at 01/17/24 1939   OLANZapine  (ZYPREXA ) tablet 5 mg  5 mg Oral QHS Cleotilde Hoy HERO, NP   5 mg at 01/17/24 2113   ondansetron  (ZOFRAN -ODT) disintegrating tablet 4 mg  4 mg Oral Q8H PRN Cleotilde Hoy HERO, NP   4 mg at 01/18/24 9146   traZODone  (DESYREL ) tablet 50 mg  50 mg Oral QHS PRN Bobbitt, Shalon E, NP   50 mg at 01/17/24 2113   PTA Medications: Medications Prior to Admission  Medication Sig Dispense Refill Last Dose/Taking   acetaminophen  (TYLENOL ) 325 MG tablet Take 650 mg by mouth every 6 (six) hours as needed.      albuterol  (VENTOLIN  HFA) 108 (90 Base) MCG/ACT inhaler Inhale 2 puffs into the lungs every 4 (four) hours as needed for wheezing or shortness of breath.      benztropine  (COGENTIN ) 1 MG tablet Take 1 tablet (1 mg total) by mouth 2 (two) times daily. 60 tablet 2    carvedilol  (COREG ) 25 MG tablet Take 1 tablet (25 mg total) by mouth 2 (two) times daily with a meal. 60 tablet  3    divalproex  (DEPAKOTE ) 250 MG DR tablet Take 3 tablets (750 mg total) by mouth every evening. 90 tablet 2    gabapentin  (NEURONTIN ) 100 MG capsule Take 100 mg by mouth 3 (three) times daily.      hydrOXYzine  (ATARAX ) 25 MG tablet Take 25 mg by mouth every 8 (eight) hours as needed for anxiety.      linaclotide  (LINZESS ) 72 MCG capsule Take 1 capsule (72 mcg total) by mouth daily before breakfast. 90 capsule 3    losartan (COZAAR) 25 MG tablet Take 25 mg by mouth daily.      OLANZapine  (ZYPREXA ) 10 MG tablet Take 10 mg by mouth at bedtime.      ondansetron  (ZOFRAN ) 4 MG tablet Take 4 mg by mouth every 6 (six) hours as needed.      ondansetron  (ZOFRAN -ODT) 4 MG disintegrating tablet Take 4 mg by mouth every 8 (eight) hours as needed.      QUEtiapine  (SEROQUEL ) 300 MG tablet Take 300 mg by mouth at bedtime.      tamsulosin (FLOMAX) 0.4 MG CAPS capsule Take 0.4 mg by mouth daily.      traZODone   (DESYREL ) 100 MG tablet Take 2 tablets (200 mg total) by mouth at bedtime as needed for sleep. 60 tablet 2     Patient Stressors: Financial difficulties   Health problems   Occupational concerns   Substance abuse   Traumatic event    Patient Strengths: Motivation for treatment/growth  Supportive family/friends   Treatment Modalities: Medication Management, Group therapy, Case management,  1 to 1 session with clinician, Psychoeducation, Recreational therapy.   Physician Treatment Plan for Primary Diagnosis: Psychoactive substance-induced psychosis (HCC) Long Term Goal(s): Improvement in symptoms so as ready for discharge   Short Term Goals: Ability to identify changes in lifestyle to reduce recurrence of condition will improve  Medication Management: Evaluate patient's response, side effects, and tolerance of medication regimen.  Therapeutic Interventions: 1 to 1 sessions, Unit Group sessions and Medication administration.  Evaluation of Outcomes: Not Met  Physician Treatment Plan for Secondary Diagnosis: Principal Problem:   Psychoactive substance-induced psychosis (HCC) Active Problems:   Bipolar disorder (HCC)   Substance induced mood disorder (HCC)  Long Term Goal(s): Improvement in symptoms so as ready for discharge   Short Term Goals: Ability to identify changes in lifestyle to reduce recurrence of condition will improve     Medication Management: Evaluate patient's response, side effects, and tolerance of medication regimen.  Therapeutic Interventions: 1 to 1 sessions, Unit Group sessions and Medication administration.  Evaluation of Outcomes: Not Met   RN Treatment Plan for Primary Diagnosis: Psychoactive substance-induced psychosis (HCC) Long Term Goal(s): Knowledge of disease and therapeutic regimen to maintain health will improve  Short Term Goals: Ability to remain free from injury will improve, Ability to verbalize frustration and anger appropriately will  improve, Ability to demonstrate self-control, Ability to participate in decision making will improve, Ability to verbalize feelings will improve, Ability to disclose and discuss suicidal ideas, Ability to identify and develop effective coping behaviors will improve, and Compliance with prescribed medications will improve  Medication Management: RN will administer medications as ordered by provider, will assess and evaluate patient's response and provide education to patient for prescribed medication. RN will report any adverse and/or side effects to prescribing provider.  Therapeutic Interventions: 1 on 1 counseling sessions, Psychoeducation, Medication administration, Evaluate responses to treatment, Monitor vital signs and CBGs as ordered, Perform/monitor CIWA, COWS, AIMS and Fall Risk  screenings as ordered, Perform wound care treatments as ordered.  Evaluation of Outcomes: Not Met   LCSW Treatment Plan for Primary Diagnosis: Psychoactive substance-induced psychosis (HCC) Long Term Goal(s): Safe transition to appropriate next level of care at discharge, Engage patient in therapeutic group addressing interpersonal concerns.  Short Term Goals: Engage patient in aftercare planning with referrals and resources, Increase social support, Increase ability to appropriately verbalize feelings, Increase emotional regulation, Facilitate acceptance of mental health diagnosis and concerns, Facilitate patient progression through stages of change regarding substance use diagnoses and concerns, Identify triggers associated with mental health/substance abuse issues, and Increase skills for wellness and recovery  Therapeutic Interventions: Assess for all discharge needs, 1 to 1 time with Social worker, Explore available resources and support systems, Assess for adequacy in community support network, Educate family and significant other(s) on suicide prevention, Complete Psychosocial Assessment, Interpersonal group  therapy.  Evaluation of Outcomes: Not Met   Progress in Treatment: Attending groups: Yes. Participating in groups: Yes. Taking medication as prescribed: Yes. Toleration medication: Yes. Family/Significant other contact made: Yes, individual(s) contacted:  attempts made to contact uncle, Larnell Carota.  Patient understands diagnosis: No. Discussing patient identified problems/goals with staff: Yes. and No. Medical problems stabilized or resolved: Yes. Denies suicidal/homicidal ideation: Yes. Issues/concerns per patient self-inventory: No. Other: none.   New problem(s) identified: No, Describe:  none identified.   New Short Term/Long Term Goal(s): detox, elimination of symptoms of psychosis, medication management for mood stabilization; elimination of SI thoughts; development of comprehensive mental wellness/sobriety plan.  Patient Goals: Work with everybody.    Discharge Plan or Barriers: CSW will assist pt with development of an appropriate aftercare/discharge plan.   Reason for Continuation of Hospitalization: Medication stabilization Other; describe disorganized thought processes, and bizarre behavior.   Estimated Length of Stay: 1-7 days  Last 3 [place] Suicide Severity Risk Score: Flowsheet Row Admission (Current) from 01/15/2024 in [facility] INPATIENT BEHAVIORAL MEDICINE ED from 01/14/2024 in [facility] Emergency Department at [facility] Admission (Discharged) from 12/25/2023 in [facility]  C-SSRS RISK CATEGORY No Risk No Risk No Risk    Last PHQ 2/9 Scores:     No data to display          Scribe for Treatment Team: [name], LCSW 01/18/2024 10:54 AM

## 2024-01-18 NOTE — Progress Notes (Signed)
   01/18/24 1000  Psych Admission Type (Psych Patients Only)  Admission Status Voluntary  Psychosocial Assessment  Patient Complaints Anxiety (patient states my heart is racing.)  Eye Contact Fair  Facial Expression Anxious  Affect Preoccupied (preccupied with Nicorette  gum)  Speech Soft  Interaction Assertive  Motor Activity Slow  Appearance/Hygiene In scrubs  Behavior Characteristics Cooperative;Appropriate to situation  Mood Preoccupied;Pleasant  Aggressive Behavior  Effect No apparent injury  Thought Process  Coherency WDL  Content WDL  Delusions None reported or observed  Perception WDL  Hallucination None reported or observed  Judgment WDL  Confusion None  Danger to Self  Current suicidal ideation? Denies  Danger to Others  Danger to Others None reported or observed   Patient's goal for today, per her self-inventory is heart, in which [place] wins again in my case is what she'll do in order to achieve her goal.

## 2024-01-18 NOTE — Group Note (Signed)
 Date:  01/18/2024 Time:  8:38 PM  Group Topic/Focus:  Orientation:   The focus of this group is to educate the patient on the purpose and policies of crisis stabilization and provide a format to answer questions about their admission.  The group details unit policies and expectations of patients while admitted.    Participation Level:  Active  Participation Quality:  Appropriate and Attentive  Affect:  Appropriate  Cognitive:  Alert and Appropriate  Insight: Appropriate  Engagement in Group:  Engaged and Improving  Modes of Intervention:  Clarification, Discussion, Education, Orientation, Rapport Building, and Support  Additional Comments:     Lyana Asbill 01/18/2024, 8:38 PM

## 2024-01-18 NOTE — Plan of Care (Signed)
   Problem: Education: Goal: Knowledge of [facility] Education information/materials will improve Outcome: Progressing Goal: Emotional status will improve Outcome: Progressing Goal: Mental status will improve Outcome: Progressing Goal: Verbalization of understanding the information provided will improve Outcome: Progressing

## 2024-01-18 NOTE — Plan of Care (Signed)

## 2024-01-18 NOTE — Progress Notes (Signed)
 [facility] MD Progress Note  01/18/2024 1:37 PM Allison Bolton  MRN:  [id number]  53 year old female with medical history of asthma, fibromyalgia, diabetes mellitus, ovarian and cervical cancer, possible colon cancer, and Mnire's disease, as well as psychiatric history of bipolar disorder and polysubstance abuse, presented to the emergency department with bizarre and chaotic behavior, gross disorganization, and positive urine drug screen for benzodiazepines, amphetamines, and THC. She was administered Geodon  and Ativan  in the ED.   Subjective:  Chart reviewed, case discussed in multidisciplinary meeting, patient seen during rounds.   On exam patient is disorganized and requires frequent redirection.  They are frequently mentioning the brown family and how they have brown people's backs.  They discussed how all of their children are biracial.  They are not irritable today.  Diagnostic clarity remains uncertain however if secondary to psychoactive substance would expect to resolve.  However given severely impaired judgment patient is danger to himself and others and requires continued psychiatric hospitalization will titrate up on Zyprexa   Sleep: UTA  Appetite:  Fair  Past Psychiatric History: see h&P Family History:  Family History  Problem Relation Age of Onset   Thyroid  disease Mother    Colon cancer Father    Breast cancer Maternal Aunt    Colon cancer Brother    Colon cancer Other        paternal and maternal grandfather   Meniere's disease Other    Anesthesia problems Neg Hx    Hypotension Neg Hx    Malignant hyperthermia Neg Hx    Pseudochol deficiency Neg Hx    Social History:  Social History   Substance and Sexual Activity  Alcohol Use No   Comment: not since June 2012     Social History   Substance and Sexual Activity  Drug Use No   Comment: patient denies any-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    Social History   Socioeconomic History   Marital status:  Legally Separated    Spouse name: Not on file   Number of children: Not on file   Years of education: Not on file   Highest education level: Not on file  Occupational History   Not on file  Tobacco Use   Smoking status: Every Day    Current packs/day: 0.00    Average packs/day: 0.5 packs/day for 37.3 years (18.7 ttl pk-yrs)    Types: Cigarettes    Start date: 07/07/1981    Last attempt to quit: 11/05/2018    Years since quitting: 5.2   Smokeless tobacco: Never  Vaping Use   Vaping status: Never Used  Substance and Sexual Activity   Alcohol use: No    Comment: not since June 2012   Drug use: No    Comment: patient denies any-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   Sexual activity: Never    Birth control/protection: Surgical  Other Topics Concern   Not on file  Social History Narrative   Not on file   Social Drivers of Health   Financial Resource Strain: Not on file  Food Insecurity: Food Insecurity Present (01/15/2024)   Hunger Vital Sign    Worried About Running Out of Food in the Last Year: Often true    Ran Out of Food in the Last Year: Often true  Transportation Needs: Unmet Transportation Needs (01/15/2024)   PRAPARE - Administrator, Civil Service (Medical): Yes    Lack of Transportation (Non-Medical): Yes  Physical  Activity: Not on file  Stress: Not on file  Social Connections: Unknown (10/14/2021)   Received from [facility]   Social Connections    Do your friends and family support you?: Not on file    What agencies support you?: Not on file   Past Medical History:  Past Medical History:  Diagnosis Date   Arthritis    Asthma    Bipolar 1 disorder (HCC)    Diverticulosis    Dysrhythmia    sts I have heart palpitations   Fibromyalgia    H/O hiatal hernia    4   Headache(784.0)    High blood pressure    Meniere's disease    S/P colonoscopy    Dr. Golda 2010: few small diverticula at sigmoid. otherwise normal.     Shortness of breath     Past Surgical History:  Procedure Laterality Date   ABDOMINAL HYSTERECTOMY     APPENDECTOMY     CESAREAN SECTION     CHOLECYSTECTOMY     COLONOSCOPY N/A 12/25/2023   Procedure: COLONOSCOPY;  Surgeon: Eartha Angelia Sieving, MD;  Location: [facility];  Service: Gastroenterology;  Laterality: N/A;  11:30am, asa 1   exploratory laparoscopy     FOOT SURGERY     HEMORRHOID SURGERY     LAPAROSCOPIC APPENDECTOMY  02/19/2011   Procedure: APPENDECTOMY LAPAROSCOPIC;  Surgeon: Oneil DELENA Budge;  Location: [facility];  Service: General;  Laterality: N/A;   LAPAROSCOPY  02/19/2011   Procedure: LAPAROSCOPY DIAGNOSTIC;  Surgeon: Oneil DELENA Budge;  Location: [facility];  Service: General;  Laterality: N/A;   left ovarian removal     multiple hernia repairs     Right ovarian removal     TONSILLECTOMY     TONSILLECTOMY AND ADENOIDECTOMY     tubes in ears     UMBILICAL HERNIA REPAIR  Dec 2011   Dr. Budge   wisdom teeth removal      Current Medications: Current Facility-Administered Medications  Medication Dose Route Frequency Provider Last Rate Last Admin   acetaminophen  (TYLENOL ) tablet 650 mg  650 mg Oral Q6H PRN Bobbitt, Shalon E, NP   650 mg at 01/18/24 0631   alum & mag hydroxide-simeth (MAALOX/MYLANTA) 200-200-20 MG/5ML suspension 30 mL  30 mL Oral Q4H PRN Bobbitt, Shalon E, NP   30 mL at 01/15/24 1750   haloperidol  (HALDOL ) tablet 5 mg  5 mg Oral TID PRN Bobbitt, Shalon E, NP       And   diphenhydrAMINE  (BENADRYL ) capsule 50 mg  50 mg Oral TID PRN Bobbitt, Shalon E, NP       haloperidol  lactate (HALDOL ) injection 5 mg  5 mg Intramuscular TID PRN Bobbitt, Shalon E, NP       And   diphenhydrAMINE  (BENADRYL ) injection 50 mg  50 mg Intramuscular TID PRN Bobbitt, Shalon E, NP       And   LORazepam  (ATIVAN ) injection 2 mg  2 mg Intramuscular TID PRN Bobbitt, Shalon E, NP       haloperidol  lactate (HALDOL ) injection 10 mg  10 mg Intramuscular TID PRN Bobbitt, Shalon E, NP   10  mg at 01/15/24 2138   And   diphenhydrAMINE  (BENADRYL ) injection 50 mg  50 mg Intramuscular TID PRN Bobbitt, Shalon E, NP   50 mg at 01/15/24 2137   And   LORazepam  (ATIVAN ) injection 2 mg  2 mg Intramuscular TID PRN Bobbitt, Shalon E, NP   2 mg at 01/15/24 2137  hydrOXYzine  (ATARAX ) tablet 25 mg  25 mg Oral TID PRN Bobbitt, Shalon E, NP   25 mg at 01/18/24 0853   magnesium  hydroxide (MILK OF MAGNESIA) suspension 30 mL  30 mL Oral Daily PRN Bobbitt, Shalon E, NP       nicotine  (NICODERM CQ  - dosed in mg/24 hours) patch 14 mg  14 mg Transdermal Daily Jadapalle, Sree, MD   14 mg at 01/18/24 9146   nicotine  polacrilex (NICORETTE ) gum 2 mg  2 mg Oral PRN Jadapalle, Sree, MD   2 mg at 01/18/24 1325   OLANZapine  (ZYPREXA ) tablet 5 mg  5 mg Oral QHS Cleotilde Hoy HERO, NP   5 mg at 01/17/24 2113   ondansetron  (ZOFRAN -ODT) disintegrating tablet 4 mg  4 mg Oral Q8H PRN Cleotilde Hoy HERO, NP   4 mg at 01/18/24 9146   traZODone  (DESYREL ) tablet 50 mg  50 mg Oral QHS PRN Bobbitt, Shalon E, NP   50 mg at 01/17/24 2113    Lab Results: No results found for this or any previous visit (from the past 48 hours).  Blood Alcohol level:  Lab Results  Component Value Date   [facility] <15 01/14/2024   ETH <10 05/07/2019    Metabolic Disorder Labs: Lab Results  Component Value Date   HGBA1C 5.3 05/11/2019   MPG 105.41 05/11/2019   MPG 114 05/13/2013   Lab Results  Component Value Date   PROLACTIN 24.1 (H) 05/11/2019   Lab Results  Component Value Date   CHOL 139 05/11/2019   TRIG 66 05/11/2019   HDL 44 05/11/2019   CHOLHDL 3.2 05/11/2019   VLDL 13 05/11/2019   LDLCALC 82 05/11/2019   LDLCALC 88 05/13/2013    Physical Findings: AIMS:  , ,  ,  ,    CIWA:    COWS:      Psychiatric Specialty Exam:  Presentation  General Appearance:  Disheveled  Eye Contact: Fair  Speech: Normal Rate; Clear and Coherent  Speech Volume: Decreased    Mood and Affect  Mood: Anxious;  Depressed  Affect: Flat   Thought Process  Thought Processes: Disorganized  Descriptions of Associations:Circumstantial  Orientation:Full (Time, Place and Person)  Thought Content:Obsessions; Rumination  Hallucinations:Hallucinations: None  Ideas of Reference:Paranoia; Percusatory  Suicidal Thoughts:Suicidal Thoughts: No  Homicidal Thoughts:Homicidal Thoughts: No   Sensorium  Memory:No data recorded Judgment: Poor  Insight: Poor   Executive Functions  Concentration: Fair  Attention Span: Poor  Recall: Good  Fund of Knowledge: Good  Language: Good   Psychomotor Activity  Psychomotor Activity: Psychomotor Activity: Normal  Musculoskeletal: Strength & Muscle Tone: within normal limits Gait & Station: normal Assets  Assets:No data recorded   Physical Exam: Physical Exam Vitals and nursing note reviewed.  HENT:     Head: Atraumatic.  Eyes:     Extraocular Movements: Extraocular movements intact.  Pulmonary:     Effort: Pulmonary effort is normal.  Neurological:     Mental Status: She is alert and oriented to person, place, and time.    ROS Blood pressure (!) 125/95, pulse 68, temperature 97.7 F (36.5 C), resp. rate 18, height 5' 6 (1.676 m), weight 77.1 kg, SpO2 98%. Body mass index is 27.44 kg/m.  Diagnosis: Principal Problem:   Psychoactive substance-induced psychosis (HCC) Active Problems:   Bipolar disorder (HCC)   Substance induced mood disorder (HCC)   PLAN: Safety and Monitoring:  -- Voluntary admission to inpatient psychiatric unit for safety, stabilization and treatment  -- Daily contact  with patient to assess and evaluate symptoms and progress in treatment  -- Patient's case to be discussed in multi-disciplinary team meeting  -- Observation Level : q15 minute checks  -- Vital signs:  q12 hours  -- Precautions: suicide, elopement, and assault -- Encouraged patient to participate in unit milieu and in scheduled group  therapies  2. Psychiatric Diagnoses and Treatment:  On exam patient appears disorganized internally preoccupied.  They are observed by staff responding to internal stimuli.  Diagnostic clarity remains unclear. Substance-induced psychotic disorder vs schizophrenia spectrum disorder  -- Increase nighttime Zyprexa  to 10 mg --Start morning Zyprexa  5 mg given ongoing psychosis       3. Medical Issues Being Addressed:    no acute needs at this time, medically cleared in ED for psychiatric care  4. Discharge Planning:   -- Social work and case management to assist with discharge planning and identification of hospital follow-up needs prior to discharge  -- Estimated LOS: 3-4 days  Donnice FORBES Right, PA-C 01/18/2024, 1:37 PM

## 2024-01-18 NOTE — Group Note (Signed)
 Recreation Therapy Group Note   Group Topic:Relaxation  Group Date: 01/18/2024 Start Time: 1530 End Time: 1615 Facilitators: Celestia Jeoffrey FORBES ARTICE, CTRS Location: Craft Room  Group Description: PMR (Progressive Muscle Relaxation). LRT asks patients their current level of stress/anxiety from 1-10, with 10 being the highest. LRT educates patients on what PMR is and the benefits that come from it. Patients are asked to sit with their feet flat on the floor while sitting up and all the way back in their chair, if possible. LRT and pts follow a prompt through a speaker that requires you to tense and release different muscles in their body and focus on their breathing. During session, lights are off and soft music is being played. Pts are given a stress ball to use if needed. At the end of the prompt, LRT asks patients to rank their current levels of stress/anxiety from 1-10, 10 being the highest. LRT provides patients with an education handout on PMR.   Goal Area(s) Addressed:  Patients will be able to describe progressive muscle relaxation.  Patient will practice using relaxation technique. Patient will identify a new coping skill.  Patient will follow multistep directions to reduce anxiety and stress.   Affect/Mood: Euthymic   Participation Level: Minimal   Participation Quality: Minimal Cues   Behavior: Restless    Clinical Observations/Individualized Feedback: Renada was present in the craft room at the time of group. Pt was noted to be pacing duration of session and unable to sit still.   Plan: Continue to engage patient in RT group sessions 2-3x/week.   Jeoffrey FORBES Celestia, LRT, CTRS 01/18/2024 5:21 PM

## 2024-01-18 NOTE — Group Note (Signed)
 LCSW Group Therapy Note   Group Date: 01/18/2024 Start Time: 1300 End Time: 1400   Type of Therapy and Topic:  Group Therapy: Challenging Core Beliefs  Participation Level:  Active  Description of Group:  Patients were educated about core beliefs and asked to identify one harmful core belief that they have. Patients were asked to explore from where those beliefs originate. Patients were asked to discuss how those beliefs make them feel and the resulting behaviors of those beliefs. They were then be asked if those beliefs are true and, if so, what evidence they have to support them. Lastly, group members were challenged to replace those negative core beliefs with helpful beliefs.   Therapeutic Goals:   1. Patient will identify harmful core beliefs and explore the origins of such beliefs. 2. Patient will identify feelings and behaviors that result from those core beliefs. 3. Patient will discuss whether such beliefs are true. 4.  Patient will replace harmful core beliefs with helpful ones.  Summary of Patient Progress:  Patient actively engaged in processing and exploring how core beliefs are formed and how they impact thoughts, feelings, and behaviors. Patient proved open to input from peers and feedback from CSW. Patient demonstrated basic insight into the subject matter, was respectful and supportive of peers, and participated throughout the entire session.  Therapeutic Modalities: Cognitive Behavioral Therapy; Solution-Focused Therapy   Kastiel Simonian M Kalief Kattner, ISRAEL 01/18/2024  3:06 PM

## 2024-01-18 NOTE — BHH Counselor (Signed)
 CSW met with pt briefly as she was standing outside of the nurses station. Upon stepping out, pt began sharing that she want to get her grandchildren back. She continued by sharing that she wanted to go to NA meetings for her marijuana use. Pt reported I don't understand [place] , but I understand Trump. Pt walked away as she continued to talk about wanting her grandchildren back and not understanding why she couldn't talk to them. Contact ended without incident.   Nadara SAUNDERS. Chaim, MSW, LCSW, LCAS 01/18/2024 3:32 PM

## 2024-01-18 NOTE — Group Note (Signed)
 Date:  01/18/2024 Time:  4:44 PM  Group Topic/Focus:  Identifying Needs:   The focus of this group is to help patients identify their personal needs that have been historically problematic and identify healthy behaviors to address their needs.    Participation Level:  Active  Participation Quality:  Appropriate  Affect:  Appropriate  Cognitive:  Appropriate  Insight: Appropriate  Engagement in Group:  Engaged  Modes of Intervention:  Education  Additional Comments:    Allison Bolton 01/18/2024, 4:44 PM

## 2024-01-19 ENCOUNTER — Encounter (HOSPITAL_COMMUNITY)

## 2024-01-19 ENCOUNTER — Telehealth (HOSPITAL_COMMUNITY): Payer: Self-pay

## 2024-01-19 DIAGNOSIS — F319 Bipolar disorder, unspecified: Secondary | ICD-10-CM | POA: Diagnosis not present

## 2024-01-19 DIAGNOSIS — F19959 Other psychoactive substance use, unspecified with psychoactive substance-induced psychotic disorder, unspecified: Secondary | ICD-10-CM | POA: Diagnosis not present

## 2024-01-19 DIAGNOSIS — F1994 Other psychoactive substance use, unspecified with psychoactive substance-induced mood disorder: Secondary | ICD-10-CM | POA: Diagnosis not present

## 2024-01-19 MED ORDER — OLANZAPINE 10 MG PO TABS
10.0000 mg | ORAL_TABLET | Freq: Two times a day (BID) | ORAL | Status: DC
  Administered 2024-01-19 – 2024-01-25 (×12): 10 mg via ORAL
  Filled 2024-01-19 (×12): qty 1

## 2024-01-19 MED ORDER — PHENYLEPHRINE-MINERAL OIL-PET 0.25-14-74.9 % RE OINT
1.0000 | TOPICAL_OINTMENT | Freq: Two times a day (BID) | RECTAL | Status: DC | PRN
  Administered 2024-01-19: 1 via RECTAL
  Filled 2024-01-19 (×4): qty 57

## 2024-01-19 NOTE — Group Note (Signed)
 Recreation Therapy Group Note   Group Topic:General Recreation  Group Date: 01/19/2024 Start Time: 0945 End Time: 1035 Facilitators: Celestia Jeoffrey BRAVO, LRT, CTRS Location: Courtyard  Group Description: [employer]. LRT and patients played games of basketball, drew with chalk, and played corn hole while outside in the courtyard while getting fresh air and sunlight. Music was being played in the background. LRT and peers conversed about different games they have played before, what they do in their free time and anything else that is on their minds. LRT encouraged pts to drink water  after being outside, sweating and getting their heart rate up.  Goal Area(s) Addressed: Patient will build on frustration tolerance skills. Patients will partake in a competitive play game with peers. Patients will gain knowledge of new leisure interest/hobby.    Affect/Mood: Full range   Participation Level: Moderate   Participation Quality: Minimal Cues   Behavior: Bizarre and Restless   Speech/Thought Process: Disorganized   Insight: Limited   Judgement: Limited   Modes of Intervention: Activity   Patient Response to Interventions:  Disengaged   Education Outcome:  In group clarification offered    Clinical Observations/Individualized Feedback: Shanese was somewhat active in their participation of session activities and group discussion. Pt shared that her daughter worked for [employer] on the weekends and that she was more grandchildren than she can count.   Plan: Continue to engage patient in RT group sessions 2-3x/week.   Jeoffrey BRAVO Celestia, LRT, CTRS 01/19/2024 11:30 AM

## 2024-01-19 NOTE — Progress Notes (Signed)
 Frequents nurses station with different medication request for first parts of shift. Did take meds. Anxious, but pleasant.

## 2024-01-19 NOTE — Progress Notes (Signed)
 [facility] MD Progress Note  01/19/2024 3:50 PM Allison Bolton  MRN:  [id number]  53 year old female with medical history of asthma, fibromyalgia, diabetes mellitus, ovarian and cervical cancer, possible colon cancer, and Mnire's disease, as well as psychiatric history of bipolar disorder and polysubstance abuse, presented to the emergency department with bizarre and chaotic behavior, gross disorganization, and positive urine drug screen for benzodiazepines, amphetamines, and THC. She was administered Geodon  and Ativan  in the ED.   Subjective:  Chart reviewed, case discussed in multidisciplinary meeting, patient seen during rounds.   7/15: Patient continues to be overtly psychotic they are disorganized on exam continue to make frequent references to their brown family and loving brown people they also today are making references to how they voted for Trump and that they are a proud American. They at times discussed how they want to return to [facility]  and then talk about how they are a resident of [place]  they do not meaningfully participate in the exam.  7/14: On exam patient is disorganized and requires frequent redirection.  They are frequently mentioning the brown family and how they have brown people's backs.  They discussed how all of their children are biracial.  They are not irritable today.  Diagnostic clarity remains uncertain however if secondary to psychoactive substance would expect to resolve.  However given severely impaired judgment patient is danger to himself and others and requires continued psychiatric hospitalization will titrate up on Zyprexa   Sleep: UTA  Appetite:  Fair  Past Psychiatric History: see h&P Family History:  Family History  Problem Relation Age of Onset   Thyroid  disease Mother    Colon cancer Father    Breast cancer Maternal Aunt    Colon cancer Brother    Colon cancer Other        paternal and maternal grandfather   Meniere's disease  Other    Anesthesia problems Neg Hx    Hypotension Neg Hx    Malignant hyperthermia Neg Hx    Pseudochol deficiency Neg Hx    Social History:  Social History   Substance and Sexual Activity  Alcohol Use No   Comment: not since June 2012     Social History   Substance and Sexual Activity  Drug Use No   Comment: patient denies any-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    Social History   Socioeconomic History   Marital status: Legally Separated    Spouse name: Not on file   Number of children: Not on file   Years of education: Not on file   Highest education level: Not on file  Occupational History   Not on file  Tobacco Use   Smoking status: Every Day    Current packs/day: 0.00    Average packs/day: 0.5 packs/day for 37.3 years (18.7 ttl pk-yrs)    Types: Cigarettes    Start date: 07/07/1981    Last attempt to quit: 11/05/2018    Years since quitting: 5.2   Smokeless tobacco: Never  Vaping Use   Vaping status: Never Used  Substance and Sexual Activity   Alcohol use: No    Comment: not since June 2012   Drug use: No    Comment: patient denies any-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   Sexual activity: Never    Birth control/protection: Surgical  Other Topics Concern   Not on file  Social History Narrative   Not on file   Social Drivers of  Health   Financial Resource Strain: Not on file  Food Insecurity: Food Insecurity Present (01/15/2024)   Hunger Vital Sign    Worried About Running Out of Food in the Last Year: Often true    Ran Out of Food in the Last Year: Often true  Transportation Needs: Unmet Transportation Needs (01/15/2024)   PRAPARE - Administrator, Civil Service (Medical): Yes    Lack of Transportation (Non-Medical): Yes  Physical Activity: Not on file  Stress: Not on file  Social Connections: Unknown (10/14/2021)   Received from [facility]   Social Connections    Do your friends and family support you?: Not  on file    What agencies support you?: Not on file   Past Medical History:  Past Medical History:  Diagnosis Date   Arthritis    Asthma    Bipolar 1 disorder (HCC)    Diverticulosis    Dysrhythmia    sts I have heart palpitations   Fibromyalgia    H/O hiatal hernia    4   Headache(784.0)    High blood pressure    Meniere's disease    S/P colonoscopy    Dr. Golda 2010: few small diverticula at sigmoid. otherwise normal.    Shortness of breath     Past Surgical History:  Procedure Laterality Date   ABDOMINAL HYSTERECTOMY     APPENDECTOMY     CESAREAN SECTION     CHOLECYSTECTOMY     COLONOSCOPY N/A 12/25/2023   Procedure: COLONOSCOPY;  Surgeon: Eartha Angelia Sieving, MD;  Location: [facility];  Service: Gastroenterology;  Laterality: N/A;  11:30am, asa 1   exploratory laparoscopy     FOOT SURGERY     HEMORRHOID SURGERY     LAPAROSCOPIC APPENDECTOMY  02/19/2011   Procedure: APPENDECTOMY LAPAROSCOPIC;  Surgeon: Oneil DELENA Budge;  Location: [facility];  Service: General;  Laterality: N/A;   LAPAROSCOPY  02/19/2011   Procedure: LAPAROSCOPY DIAGNOSTIC;  Surgeon: Oneil DELENA Budge;  Location: [facility];  Service: General;  Laterality: N/A;   left ovarian removal     multiple hernia repairs     Right ovarian removal     TONSILLECTOMY     TONSILLECTOMY AND ADENOIDECTOMY     tubes in ears     UMBILICAL HERNIA REPAIR  Dec 2011   Dr. Budge   wisdom teeth removal      Current Medications: Current Facility-Administered Medications  Medication Dose Route Frequency Provider Last Rate Last Admin   acetaminophen  (TYLENOL ) tablet 650 mg  650 mg Oral Q6H PRN Bobbitt, Shalon E, NP   650 mg at 01/19/24 0827   alum & mag hydroxide-simeth (MAALOX/MYLANTA) 200-200-20 MG/5ML suspension 30 mL  30 mL Oral Q4H PRN Bobbitt, Shalon E, NP   30 mL at 01/15/24 1750   haloperidol  (HALDOL ) tablet 5 mg  5 mg Oral TID PRN Bobbitt, Shalon E, NP   5 mg at 01/19/24 1328   And   diphenhydrAMINE  (BENADRYL )  capsule 50 mg  50 mg Oral TID PRN Bobbitt, Shalon E, NP   50 mg at 01/19/24 1327   haloperidol  lactate (HALDOL ) injection 5 mg  5 mg Intramuscular TID PRN Bobbitt, Shalon E, NP       And   diphenhydrAMINE  (BENADRYL ) injection 50 mg  50 mg Intramuscular TID PRN Bobbitt, Shalon E, NP       And   LORazepam  (ATIVAN ) injection 2 mg  2 mg Intramuscular TID PRN Bobbitt, Shalon  E, NP       haloperidol  lactate (HALDOL ) injection 10 mg  10 mg Intramuscular TID PRN Bobbitt, Shalon E, NP   10 mg at 01/15/24 2138   And   diphenhydrAMINE  (BENADRYL ) injection 50 mg  50 mg Intramuscular TID PRN Bobbitt, Shalon E, NP   50 mg at 01/15/24 2137   And   LORazepam  (ATIVAN ) injection 2 mg  2 mg Intramuscular TID PRN Bobbitt, Shalon E, NP   2 mg at 01/15/24 2137   hydrOXYzine  (ATARAX ) tablet 25 mg  25 mg Oral TID PRN Bobbitt, Shalon E, NP   25 mg at 01/19/24 1327   magnesium  hydroxide (MILK OF MAGNESIA) suspension 30 mL  30 mL Oral Daily PRN Bobbitt, Shalon E, NP       nicotine  (NICODERM CQ  - dosed in mg/24 hours) patch 14 mg  14 mg Transdermal Daily Jadapalle, Sree, MD   14 mg at 01/19/24 0827   nicotine  polacrilex (NICORETTE ) gum 2 mg  2 mg Oral PRN Jadapalle, Sree, MD   2 mg at 01/19/24 1549   OLANZapine  (ZYPREXA ) tablet 10 mg  10 mg Oral QHS Sherrill Buikema E, PA-C   10 mg at 01/18/24 2051   OLANZapine  (ZYPREXA ) tablet 5 mg  5 mg Oral Daily Heidi Maclin E, PA-C   5 mg at 01/19/24 9173   ondansetron  (ZOFRAN -ODT) disintegrating tablet 4 mg  4 mg Oral Q8H PRN Cleotilde Hoy HERO, NP   4 mg at 01/18/24 2050   traZODone  (DESYREL ) tablet 50 mg  50 mg Oral QHS PRN Bobbitt, Shalon E, NP   50 mg at 01/18/24 2051    Lab Results:  Results for orders placed or performed during the hospital encounter of 01/15/24 (from the past 48 hours)  Hemoglobin A1c     Status: None   Collection Time: 01/18/24  5:38 PM  Result Value Ref Range   Hgb A1c MFr Bld 5.1 4.8 - 5.6 %    Comment: (NOTE) Diagnosis of Diabetes The  following HbA1c ranges recommended by the American Diabetes Association (ADA) may be used as an aid in the diagnosis of diabetes mellitus.  Hemoglobin             Suggested A1C NGSP%              Diagnosis  <5.7                   Non Diabetic  5.7-6.4                Pre-Diabetic  >6.4                   Diabetic  <7.0                   Glycemic control for                       adults with diabetes.     Mean Plasma Glucose 99.67 mg/dL    Comment: Performed at [facility], [address], [place], [place] [address]  Lipid panel     Status: Abnormal   Collection Time: 01/18/24  5:38 PM  Result Value Ref Range   Cholesterol 154 0 - 200 mg/dL   Triglycerides 819 (H) <150 mg/dL   HDL 49 >59 mg/dL   Total CHOL/HDL Ratio 3.1 RATIO   VLDL 36 0 - 40 mg/dL   LDL Cholesterol 69 0 - 99 mg/dL  Comment:        Total Cholesterol/HDL:CHD Risk Coronary Heart Disease Risk Table                     Men   Women  1/2 Average Risk   3.4   3.3  Average Risk       5.0   4.4  2 X Average Risk   9.6   7.1  3 X Average Risk  23.4   11.0        Use the calculated Patient Ratio above and the CHD Risk Table to determine the patient's CHD Risk.        ATP III CLASSIFICATION (LDL):  <100     mg/dL   Optimal  899-870  mg/dL   Near or Above                    Optimal  130-159  mg/dL   Borderline  839-810  mg/dL   High  >809     mg/dL   Very High Performed at [facility], [address] [place], [place] [address]     Blood Alcohol level:  Lab Results  Component Value Date   [facility] <15 01/14/2024   ETH <10 05/07/2019    Metabolic Disorder Labs: Lab Results  Component Value Date   HGBA1C 5.1 01/18/2024   MPG 99.67 01/18/2024   MPG 105.41 05/11/2019   Lab Results  Component Value Date   PROLACTIN 24.1 (H) 05/11/2019   Lab Results  Component Value Date   CHOL 154 01/18/2024   TRIG 180 (H) 01/18/2024   HDL 49 01/18/2024   CHOLHDL 3.1 01/18/2024   VLDL 36  01/18/2024   LDLCALC 69 01/18/2024   LDLCALC 82 05/11/2019    Physical Findings: AIMS:  , ,  ,  ,    CIWA:    COWS:      Psychiatric Specialty Exam:  Presentation  General Appearance:  Disheveled  Eye Contact: Fair  Speech: Normal Rate; Clear and Coherent  Speech Volume: Decreased    Mood and Affect  Mood: Anxious; Depressed  Affect: Flat   Thought Process  Thought Processes: Disorganized  Descriptions of Associations:Circumstantial  Orientation:Full (Time, Place and Person)  Thought Content:Obsessions; Rumination  Hallucinations:No data recorded  Ideas of Reference:Paranoia; Percusatory  Suicidal Thoughts:No data recorded  Homicidal Thoughts:No data recorded   Sensorium  Memory:No data recorded Judgment: Poor  Insight: Poor   Executive Functions  Concentration: Fair  Attention Span: Poor  Recall: Good  Fund of Knowledge: Good  Language: Good   Psychomotor Activity  Psychomotor Activity: No data recorded  Musculoskeletal: Strength & Muscle Tone: within normal limits Gait & Station: normal Assets  Assets:No data recorded   Physical Exam: Physical Exam Vitals and nursing note reviewed.  HENT:     Head: Atraumatic.  Eyes:     Extraocular Movements: Extraocular movements intact.  Pulmonary:     Effort: Pulmonary effort is normal.  Neurological:     Mental Status: She is alert and oriented to person, place, and time.    ROS Blood pressure 113/74, pulse 78, temperature 97.7 F (36.5 C), resp. rate 16, height 5' 6 (1.676 m), weight 77.1 kg, SpO2 99%. Body mass index is 27.44 kg/m.  Diagnosis: Principal Problem:   Psychoactive substance-induced psychosis (HCC) Active Problems:   Bipolar disorder (HCC)   Substance induced mood disorder (HCC)   PLAN: Safety and Monitoring:  -- Voluntary admission to inpatient  psychiatric unit for safety, stabilization and treatment  -- Daily contact with patient to assess  and evaluate symptoms and progress in treatment  -- Patient's case to be discussed in multi-disciplinary team meeting  -- Observation Level : q15 minute checks  -- Vital signs:  q12 hours  -- Precautions: suicide, elopement, and assault -- Encouraged patient to participate in unit milieu and in scheduled group therapies  2. Psychiatric Diagnoses and Treatment:  Patient continues to be floridly psychotic on exam.  They are disorganized and unable to meaningfully participate in the exam.  They require PRNs for agitation.  Will continue to titrate Zyprexa  given current agitation there have been no signs of physical aggression.  Increase Zyprexa  to 10 mg twice daily       3. Medical Issues Being Addressed:    no acute needs at this time, medically cleared in ED for psychiatric care  4. Discharge Planning:   -- Social work and case management to assist with discharge planning and identification of hospital follow-up needs prior to discharge  -- Estimated LOS: 3-4 days  Donnice FORBES Right, PA-C 01/19/2024, 3:50 PM

## 2024-01-19 NOTE — Telephone Encounter (Signed)
 No show #3, attempted to call with answer and mailbox is full so unable to leave message concerning missed apt. Sending letter in mail regarding no show policy. Will cancel all remaining apts.  Augustin Mclean, LPTA/CLT; WILLAIM [phone]

## 2024-01-19 NOTE — Plan of Care (Signed)

## 2024-01-19 NOTE — Group Note (Signed)
 [facility] LCSW Group Therapy Note   Group Date: 01/19/2024 Start Time: 1300 End Time: 1400   Type of Therapy/Topic:  Group Therapy:  Balance in Life  Participation Level:  Did Not Attend   Description of Group:    This group will address the concept of balance and how it feels and looks when one is unbalanced. Patients will be encouraged to process areas in their lives that are out of balance, and identify reasons for remaining unbalanced. Facilitators will guide patients utilizing problem- solving interventions to address and correct the stressor making their life unbalanced. Understanding and applying boundaries will be explored and addressed for obtaining  and maintaining a balanced life. Patients will be encouraged to explore ways to assertively make their unbalanced needs known to significant others in their lives, using other group members and facilitator for support and feedback.  Therapeutic Goals: Patient will identify two or more emotions or situations they have that consume much of in their lives. Patient will identify signs/triggers that life has become out of balance:  Patient will identify two ways to set boundaries in order to achieve balance in their lives:  Patient will demonstrate ability to communicate their needs through discussion and/or role plays  Summary of Patient Progress: Patient was disruptive and not able to attend group.     Therapeutic Modalities:   Cognitive Behavioral Therapy Solution-Focused Therapy Assertiveness Training   Sherryle JINNY Margo, LCSW

## 2024-01-19 NOTE — Group Note (Signed)
 Recreation Therapy Group Note   Group Topic:Leisure Education  Group Date: 01/19/2024 Start Time: 1530 End Time: 1630 Facilitators: Celestia Jeoffrey FORBES ARTICE, CTRS Location: Craft Room  Group Description: Leisure. Patients were given the option to choose from coloring mandalas, using oil pastels, journaling, playing cards, or playing with play-doh. LRT and pts discussed the meaning of leisure, the importance of participating in leisure during their free time/when they're outside of the hospital, as well as how our leisure interests can also serve as coping skills.   Goal Area(s) Addressed:  Patient will identify a current leisure interest.  Patient will learn the definition of "leisure". Patient will practice making a positive decision. Patient will have the opportunity to try a new leisure activity. Patient will communicate with peers and LRT.    Affect/Mood: Full range   Participation Level: Minimal    Clinical Observations/Individualized Feedback: Chikita was in and out of group. Pt is intrusive and has disorganized speech.   Plan: Continue to engage patient in RT group sessions 2-3x/week.   Jeoffrey FORBES Celestia, LRT, CTRS 01/19/2024 4:46 PM

## 2024-01-19 NOTE — Group Note (Signed)
 Date:  01/19/2024 Time:  10:11 AM  Group Topic/Focus:  Goals Group:   The focus of this group is to help patients establish daily goals to achieve during treatment and discuss how the patient can incorporate goal setting into their daily lives to aide in recovery.    Participation Level:  Active  Participation Quality:  Intrusive and Redirectable  Affect:  Labile and Not Congruent  Cognitive:  Disorganized  Insight: Limited  Engagement in Group:  Distracting and Off Topic  Modes of Intervention:  Discussion and Socialization  Additional Comments:    Deitra Caron Mainland 01/19/2024, 10:11 AM

## 2024-01-19 NOTE — Progress Notes (Signed)
   01/19/24 0900  Psych Admission Type (Psych Patients Only)  Admission Status Voluntary  Psychosocial Assessment  Patient Complaints Disorientation;Restlessness  Eye Contact Staring;Watchful  Facial Expression Anxious  Affect Preoccupied (patient was preoccupied with her family coming to visit her from [place], [place].)  Speech Soft  Interaction Assertive  Motor Activity Slow  Appearance/Hygiene In scrubs;Body odor;Disheveled;Poor hygiene  Behavior Characteristics Pacing;Restless;Cooperative  Mood Anxious;Preoccupied;Pleasant (patient has been wanting to use the phone all day, not during phone times; patient also fixated on my clothes, my family won't recognize me without my clothes.)  Aggressive Behavior  Effect No apparent injury  Thought Process  Coherency Loose associations;Tangential;Disorganized;Flight of ideas (patient stated first thing this morning I need fresh air before I lose it; also I have to get out of this state, I'm not going to be homeless here)  Content Preoccupation  Delusions None reported or observed  Perception Depersonalization;Derealization (patient asked this writer do I have barbies? they tried to come in here with me.)  Hallucination None reported or observed  Judgment Poor  Confusion Mild  Danger to Self  Current suicidal ideation? Denies  Danger to Others  Danger to Others None reported or observed   Patient's goal for today, per her self-inventory is to work on God.

## 2024-01-19 NOTE — Progress Notes (Signed)
 Pt presents with bizarre behaviors, tangential speech. Pt is redirectable at this time. Pt compliant with medication administration per MD orders. Pt given education, support, and encouragement to be active in her treatment plan. Pt is preoccupied and continues to say she needs to go to [facility] and that no one understands that. Pt being monitored Q 15 minutes for safety per unit protocol, remains safe on the unit

## 2024-01-19 NOTE — Group Note (Signed)
 Date:  01/19/2024 Time:  9:21 PM  Group Topic/Focus:  Relapse Prevention Planning:   The focus of this group is to define relapse and discuss the need for planning to combat relapse. Wrap-Up Group:   The focus of this group is to help patients review their daily goal of treatment and discuss progress on daily workbooks.    Participation Level:  Minimal  Participation Quality:  Appropriate  Affect:  Appropriate  Cognitive:  Appropriate  Insight: Good  Engagement in Group:  Engaged  Modes of Intervention:  Discussion  Additional Comments:    Kristen VEAR Gibbon 01/19/2024, 9:21 PM

## 2024-01-19 NOTE — Plan of Care (Signed)
   Problem: Education: Goal: Emotional status will improve Outcome: Progressing Goal: Mental status will improve Outcome: Progressing

## 2024-01-20 DIAGNOSIS — F19959 Other psychoactive substance use, unspecified with psychoactive substance-induced psychotic disorder, unspecified: Secondary | ICD-10-CM | POA: Diagnosis not present

## 2024-01-20 MED ORDER — CLONIDINE HCL 0.1 MG PO TABS
0.1000 mg | ORAL_TABLET | Freq: Every day | ORAL | Status: DC
  Administered 2024-01-21 – 2024-01-23 (×3): 0.1 mg via ORAL
  Filled 2024-01-20 (×3): qty 1

## 2024-01-20 MED ORDER — DIVALPROEX SODIUM 500 MG PO DR TAB
500.0000 mg | DELAYED_RELEASE_TABLET | Freq: Two times a day (BID) | ORAL | Status: DC
  Administered 2024-01-20 – 2024-01-24 (×8): 500 mg via ORAL
  Filled 2024-01-20 (×8): qty 1

## 2024-01-20 NOTE — Group Note (Signed)
 [facility] LCSW Group Therapy Note   Group Date: 01/20/2024 Start Time: 1300 End Time: 1400   Type of Therapy/Topic:  Group Therapy:  Emotion Regulation  Participation Level:  Minimal    Description of Group:    The purpose of this group is to assist patients in learning to regulate negative emotions and experience positive emotions. Patients will be guided to discuss ways in which they have been vulnerable to their negative emotions. These vulnerabilities will be juxtaposed with experiences of positive emotions or situations, and patients challenged to use positive emotions to combat negative ones. Special emphasis will be placed on coping with negative emotions in conflict situations, and patients will process healthy conflict resolution skills.  Therapeutic Goals: Patient will identify two positive emotions or experiences to reflect on in order to balance out negative emotions:  Patient will label two or more emotions that they find the most difficult to experience:  Patient will be able to demonstrate positive conflict resolution skills through discussion or role plays:   Summary of Patient Progress: Patient was in and out of group. She was able to identify some physical symptoms that she experiences with some strong negative emotions. Outside of this pt was off topic but was easily redirected. Insight into topic remains questionable.   Therapeutic Modalities:   Cognitive Behavioral Therapy Feelings Identification Dialectical Behavioral Therapy   Allison JONELLE Fam, LCSW

## 2024-01-20 NOTE — Group Note (Signed)
 Date:  01/20/2024 Time:  8:31 PM  Group Topic/Focus:  Wrap-Up Group:   The focus of this group is to help patients review their daily goal of treatment and discuss progress on daily workbooks.    Participation Level:  Did Not Attend  Participation Quality:  none  Affect:  none  Cognitive:  none  Insight: None  Engagement in Group:  none  Modes of Intervention:  none  Additional Comments:  none   Kerri Katz 01/20/2024, 8:31 PM

## 2024-01-20 NOTE — Progress Notes (Signed)
 [facility] MD Progress Note  01/20/2024 3:24 PM Allison Bolton  MRN:  [id number]  53 year old female with medical history of asthma, fibromyalgia, diabetes mellitus, ovarian and cervical cancer, possible colon cancer, and Mnire's disease, as well as psychiatric history of bipolar disorder and polysubstance abuse, presented to the emergency department with bizarre and chaotic behavior, gross disorganization, and positive urine drug screen for benzodiazepines, amphetamines, and THC. She was administered Geodon  and Ativan  in the ED.   Subjective:  Chart reviewed, case discussed in multidisciplinary meeting, patient seen during rounds.   7/16: Patient continues with overt psychosis.  They require frequent redirection and are not easily redirectable they have required multiple PRNs for agitation.  They continue to fixate on their love for black and brown people they are also fixated on moving to [facility] [place] .  They do not meaningfully participate in exam.  7/15: Patient continues to be overtly psychotic they are disorganized on exam continue to make frequent references to their brown family and loving brown people they also today are making references to how they voted for Trump and that they are a proud American. They at times discussed how they want to return to [facility] [place]  and then talk about how they are a resident of [place]  they do not meaningfully participate in the exam.  7/14: On exam patient is disorganized and requires frequent redirection.  They are frequently mentioning the brown family and how they have brown people's backs.  They discussed how all of their children are biracial.  They are not irritable today.  Diagnostic clarity remains uncertain however if secondary to psychoactive substance would expect to resolve.  However given severely impaired judgment patient is danger to himself and others and requires continued psychiatric hospitalization will titrate up on  Zyprexa   Sleep: UTA  Appetite:  Fair  Past Psychiatric History: see h&P Family History:  Family History  Problem Relation Age of Onset   Thyroid  disease Mother    Colon cancer Father    Breast cancer Maternal Aunt    Colon cancer Brother    Colon cancer Other        paternal and maternal grandfather   Meniere's disease Other    Anesthesia problems Neg Hx    Hypotension Neg Hx    Malignant hyperthermia Neg Hx    Pseudochol deficiency Neg Hx    Social History:  Social History   Substance and Sexual Activity  Alcohol Use No   Comment: not since June 2012     Social History   Substance and Sexual Activity  Drug Use No   Comment: patient denies any-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    Social History   Socioeconomic History   Marital status: Legally Separated    Spouse name: Not on file   Number of children: Not on file   Years of education: Not on file   Highest education level: Not on file  Occupational History   Not on file  Tobacco Use   Smoking status: Every Day    Current packs/day: 0.00    Average packs/day: 0.5 packs/day for 37.3 years (18.7 ttl pk-yrs)    Types: Cigarettes    Start date: 07/07/1981    Last attempt to quit: 11/05/2018    Years since quitting: 5.2   Smokeless tobacco: Never  Vaping Use   Vaping status: Never Used  Substance and Sexual Activity   Alcohol use: No    Comment: not since June 2012  Drug use: No    Comment: patient denies any-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   Sexual activity: Never    Birth control/protection: Surgical  Other Topics Concern   Not on file  Social History Narrative   Not on file   Social Drivers of Health   Financial Resource Strain: Not on file  Food Insecurity: Food Insecurity Present (01/15/2024)   Hunger Vital Sign    Worried About Running Out of Food in the Last Year: Often true    Ran Out of Food in the Last Year: Often true  Transportation Needs: Unmet Transportation Needs  (01/15/2024)   PRAPARE - Administrator, Civil Service (Medical): Yes    Lack of Transportation (Non-Medical): Yes  Physical Activity: Not on file  Stress: Not on file  Social Connections: Unknown (10/14/2021)   Received from [facility]   Social Connections    Do your friends and family support you?: Not on file    What agencies support you?: Not on file   Past Medical History:  Past Medical History:  Diagnosis Date   Arthritis    Asthma    Bipolar 1 disorder (HCC)    Diverticulosis    Dysrhythmia    sts I have heart palpitations   Fibromyalgia    H/O hiatal hernia    4   Headache(784.0)    High blood pressure    Meniere's disease    S/P colonoscopy    Dr. Golda 2010: few small diverticula at sigmoid. otherwise normal.    Shortness of breath     Past Surgical History:  Procedure Laterality Date   ABDOMINAL HYSTERECTOMY     APPENDECTOMY     CESAREAN SECTION     CHOLECYSTECTOMY     COLONOSCOPY N/A 12/25/2023   Procedure: COLONOSCOPY;  Surgeon: Eartha Angelia Sieving, MD;  Location: [facility];  Service: Gastroenterology;  Laterality: N/A;  11:30am, asa 1   exploratory laparoscopy     FOOT SURGERY     HEMORRHOID SURGERY     LAPAROSCOPIC APPENDECTOMY  02/19/2011   Procedure: APPENDECTOMY LAPAROSCOPIC;  Surgeon: Oneil DELENA Budge;  Location: [facility];  Service: General;  Laterality: N/A;   LAPAROSCOPY  02/19/2011   Procedure: LAPAROSCOPY DIAGNOSTIC;  Surgeon: Oneil DELENA Budge;  Location: [facility];  Service: General;  Laterality: N/A;   left ovarian removal     multiple hernia repairs     Right ovarian removal     TONSILLECTOMY     TONSILLECTOMY AND ADENOIDECTOMY     tubes in ears     UMBILICAL HERNIA REPAIR  Dec 2011   Dr. Budge   wisdom teeth removal      Current Medications: Current Facility-Administered Medications  Medication Dose Route Frequency Provider Last Rate Last Admin   acetaminophen  (TYLENOL ) tablet 650 mg  650 mg Oral Q6H  PRN Bobbitt, Shalon E, NP   650 mg at 01/20/24 0824   alum & mag hydroxide-simeth (MAALOX/MYLANTA) 200-200-20 MG/5ML suspension 30 mL  30 mL Oral Q4H PRN Bobbitt, Shalon E, NP   30 mL at 01/20/24 0824   [START ON 01/21/2024] cloNIDine  (CATAPRES ) tablet 0.1 mg  0.1 mg Oral Daily Olar Santini E, PA-C       haloperidol  (HALDOL ) tablet 5 mg  5 mg Oral TID PRN Bobbitt, Shalon E, NP   5 mg at 01/20/24 0034   And   diphenhydrAMINE  (BENADRYL ) capsule 50 mg  50 mg Oral TID PRN  Bobbitt, Shalon E, NP   50 mg at 01/20/24 0034   haloperidol  lactate (HALDOL ) injection 5 mg  5 mg Intramuscular TID PRN Bobbitt, Shalon E, NP       And   diphenhydrAMINE  (BENADRYL ) injection 50 mg  50 mg Intramuscular TID PRN Bobbitt, Shalon E, NP       And   LORazepam  (ATIVAN ) injection 2 mg  2 mg Intramuscular TID PRN Bobbitt, Shalon E, NP       haloperidol  lactate (HALDOL ) injection 10 mg  10 mg Intramuscular TID PRN Bobbitt, Shalon E, NP   10 mg at 01/20/24 1413   And   diphenhydrAMINE  (BENADRYL ) injection 50 mg  50 mg Intramuscular TID PRN Bobbitt, Shalon E, NP   50 mg at 01/20/24 1414   And   LORazepam  (ATIVAN ) injection 2 mg  2 mg Intramuscular TID PRN Bobbitt, Shalon E, NP   2 mg at 01/20/24 1414   divalproex  (DEPAKOTE ) DR tablet 500 mg  500 mg Oral Q12H Syana Degraffenreid E, PA-C       hydrOXYzine  (ATARAX ) tablet 25 mg  25 mg Oral TID PRN Bobbitt, Shalon E, NP   25 mg at 01/20/24 1210   magnesium  hydroxide (MILK OF MAGNESIA) suspension 30 mL  30 mL Oral Daily PRN Bobbitt, Shalon E, NP       nicotine  (NICODERM CQ  - dosed in mg/24 hours) patch 14 mg  14 mg Transdermal Daily Jadapalle, Sree, MD   14 mg at 01/20/24 0825   nicotine  polacrilex (NICORETTE ) gum 2 mg  2 mg Oral PRN Jadapalle, Sree, MD   2 mg at 01/20/24 1348   OLANZapine  (ZYPREXA ) tablet 10 mg  10 mg Oral BID Kru Allman E, PA-C   10 mg at 01/20/24 9171   ondansetron  (ZOFRAN -ODT) disintegrating tablet 4 mg  4 mg Oral Q8H PRN Cleotilde Hoy HERO, NP    4 mg at 01/19/24 2036   phenylephrine -shark liver oil-mineral oil-petrolatum  (PREPARATION H) rectal ointment 1 Application  1 Application Rectal BID PRN Salem Lembke E, PA-C   1 Application at 01/19/24 2257   traZODone  (DESYREL ) tablet 50 mg  50 mg Oral QHS PRN Bobbitt, Shalon E, NP   50 mg at 01/19/24 2036    Lab Results:  Results for orders placed or performed during the hospital encounter of 01/15/24 (from the past 48 hours)  Hemoglobin A1c     Status: None   Collection Time: 01/18/24  5:38 PM  Result Value Ref Range   Hgb A1c MFr Bld 5.1 4.8 - 5.6 %    Comment: (NOTE) Diagnosis of Diabetes The following HbA1c ranges recommended by the American Diabetes Association (ADA) may be used as an aid in the diagnosis of diabetes mellitus.  Hemoglobin             Suggested A1C NGSP%              Diagnosis  <5.7                   Non Diabetic  5.7-6.4                Pre-Diabetic  >6.4                   Diabetic  <7.0                   Glycemic control for  adults with diabetes.     Mean Plasma Glucose 99.67 mg/dL    Comment: Performed at [facility], [address], [place], [place] [address]  Lipid panel     Status: Abnormal   Collection Time: 01/18/24  5:38 PM  Result Value Ref Range   Cholesterol 154 0 - 200 mg/dL   Triglycerides 819 (H) <150 mg/dL   HDL 49 >59 mg/dL   Total CHOL/HDL Ratio 3.1 RATIO   VLDL 36 0 - 40 mg/dL   LDL Cholesterol 69 0 - 99 mg/dL    Comment:        Total Cholesterol/HDL:CHD Risk Coronary Heart Disease Risk Table                     Men   Women  1/2 Average Risk   3.4   3.3  Average Risk       5.0   4.4  2 X Average Risk   9.6   7.1  3 X Average Risk  23.4   11.0        Use the calculated Patient Ratio above and the CHD Risk Table to determine the patient's CHD Risk.        ATP III CLASSIFICATION (LDL):  <100     mg/dL   Optimal  899-870  mg/dL   Near or Above                    Optimal  130-159  mg/dL    Borderline  839-810  mg/dL   High  >809     mg/dL   Very High Performed at [facility], [address] [place], [place] [address]     Blood Alcohol level:  Lab Results  Component Value Date   [facility] <15 01/14/2024   ETH <10 05/07/2019    Metabolic Disorder Labs: Lab Results  Component Value Date   HGBA1C 5.1 01/18/2024   MPG 99.67 01/18/2024   MPG 105.41 05/11/2019   Lab Results  Component Value Date   PROLACTIN 24.1 (H) 05/11/2019   Lab Results  Component Value Date   CHOL 154 01/18/2024   TRIG 180 (H) 01/18/2024   HDL 49 01/18/2024   CHOLHDL 3.1 01/18/2024   VLDL 36 01/18/2024   LDLCALC 69 01/18/2024   LDLCALC 82 05/11/2019    Physical Findings: AIMS:  , ,  ,  ,    CIWA:    COWS:      Psychiatric Specialty Exam:  Presentation  General Appearance:  Disheveled  Eye Contact: Fair  Speech: Normal Rate; Clear and Coherent  Speech Volume: Decreased    Mood and Affect  Mood: Anxious; Depressed  Affect: Flat   Thought Process  Thought Processes: Disorganized  Descriptions of Associations:Circumstantial  Orientation:Full (Time, Place and Person)  Thought Content:Obsessions; Rumination  Hallucinations:No data recorded  Ideas of Reference:Paranoia; Percusatory  Suicidal Thoughts:No data recorded  Homicidal Thoughts:No data recorded   Sensorium  Memory:No data recorded Judgment: Poor  Insight: Poor   Executive Functions  Concentration: Fair  Attention Span: Poor  Recall: Good  Fund of Knowledge: Good  Language: Good   Psychomotor Activity  Psychomotor Activity: No data recorded  Musculoskeletal: Strength & Muscle Tone: within normal limits Gait & Station: normal Assets  Assets:No data recorded   Physical Exam: Physical Exam Vitals and nursing note reviewed.  HENT:     Head: Atraumatic.  Eyes:     Extraocular Movements: Extraocular movements  intact.  Pulmonary:     Effort: Pulmonary  effort is normal.  Neurological:     Mental Status: She is alert and oriented to person, place, and time.    ROS Blood pressure 122/89, pulse 89, temperature (!) 97.3 F (36.3 C), resp. rate 16, height 5' 6 (1.676 m), weight 77.1 kg, SpO2 97%. Body mass index is 27.44 kg/m.  Diagnosis: Principal Problem:   Psychoactive substance-induced psychosis (HCC) Active Problems:   Bipolar disorder (HCC)   Substance induced mood disorder (HCC)   PLAN: Safety and Monitoring:  -- Voluntary admission to inpatient psychiatric unit for safety, stabilization and treatment  -- Daily contact with patient to assess and evaluate symptoms and progress in treatment  -- Patient's case to be discussed in multi-disciplinary team meeting  -- Observation Level : q15 minute checks  -- Vital signs:  q12 hours  -- Precautions: suicide, elopement, and assault -- Encouraged patient to participate in unit milieu and in scheduled group therapies  2. Psychiatric Diagnoses and Treatment:  Patient continues to be floridly psychotic on exam.  They are disorganized and unable to meaningfully participate in the exam.  They require PRNs for agitation.  Will continue to titrate Zyprexa  given current agitation there have been no signs of physical aggression.  Continue Zyprexa  to 10 mg twice daily Start clonidine  0.1 mg daily for impulsivity and agitation Start Depakote  500 mg BID has tolerated in the past.        3. Medical Issues Being Addressed:    no acute needs at this time, medically cleared in ED for psychiatric care  4. Discharge Planning:   -- Social work and case management to assist with discharge planning and identification of hospital follow-up needs prior to discharge  -- Estimated LOS: 3-4 days  Donnice FORBES Right, PA-C 01/20/2024, 3:24 PM

## 2024-01-20 NOTE — Progress Notes (Signed)
 Patient yelling and hitting the wall. Patient verbally and physically aggressive. IM agitation protocol administered, per MAR. No adverse reactions noted. Safety checks continue. Patient remains safe at this time.

## 2024-01-20 NOTE — Progress Notes (Signed)
   01/20/24 0824  Psych Admission Type (Psych Patients Only)  Admission Status Voluntary  Psychosocial Assessment  Patient Complaints Restlessness;Anxiety  Eye Contact Fair  Facial Expression Anxious  Affect Preoccupied  Speech Soft  Interaction Needy  Motor Activity Other (Comment) (WDL)  Appearance/Hygiene In scrubs;Body odor  Behavior Characteristics Pacing;Restless  Mood Anxious;Preoccupied  Thought Process  Coherency Tangential;Loose associations;Disorganized;Flight of ideas  Content Preoccupation  Delusions None reported or observed  Perception WDL  Hallucination None reported or observed  Judgment Poor  Confusion Mild  Danger to Self  Current suicidal ideation? Denies  Danger to Others  Danger to Others None reported or observed

## 2024-01-20 NOTE — Plan of Care (Signed)
   Problem: Education: Goal: Emotional status will improve Outcome: Not Progressing Goal: Mental status will improve Outcome: Not Progressing

## 2024-01-20 NOTE — Group Note (Signed)
 Date:  01/20/2024 Time:  5:30 PM  Group Topic/Focus:  Goals Group:   The focus of this group is to help patients establish daily goals to achieve during treatment and discuss how the patient can incorporate goal setting into their daily lives to aide in recovery.    Participation Level:  Minimal  Participation Quality:  Intrusive  Affect:  Labile and Not Congruent  Cognitive:  Disorganized and Delusional  Insight: Limited  Engagement in Group:  Distracting, Off Topic, and Poor  Modes of Intervention:  Discussion, Education, and Support  Additional Comments:    Allison Bolton 01/20/2024, 5:30 PM

## 2024-01-20 NOTE — Plan of Care (Signed)
   Problem: Education: Goal: Emotional status will improve Outcome: Progressing Goal: Mental status will improve Outcome: Progressing Goal: Verbalization of understanding the information provided will improve Outcome: Progressing

## 2024-01-21 ENCOUNTER — Encounter (HOSPITAL_COMMUNITY)

## 2024-01-21 DIAGNOSIS — F19959 Other psychoactive substance use, unspecified with psychoactive substance-induced psychotic disorder, unspecified: Secondary | ICD-10-CM | POA: Diagnosis not present

## 2024-01-21 DIAGNOSIS — F319 Bipolar disorder, unspecified: Secondary | ICD-10-CM | POA: Diagnosis not present

## 2024-01-21 DIAGNOSIS — F1994 Other psychoactive substance use, unspecified with psychoactive substance-induced mood disorder: Secondary | ICD-10-CM | POA: Diagnosis not present

## 2024-01-21 MED ORDER — LORAZEPAM 2 MG/ML IJ SOLN
2.0000 mg | Freq: Once | INTRAMUSCULAR | Status: AC
  Administered 2024-01-21: 2 mg via INTRAMUSCULAR
  Filled 2024-01-21: qty 1

## 2024-01-21 MED ORDER — FLUPHENAZINE HCL 5 MG PO TABS
5.0000 mg | ORAL_TABLET | Freq: Two times a day (BID) | ORAL | Status: DC
Start: 2024-01-21 — End: 2024-01-25
  Administered 2024-01-21 – 2024-01-25 (×8): 5 mg via ORAL
  Filled 2024-01-21 (×9): qty 1

## 2024-01-21 MED ORDER — BENZOCAINE 10 % MT GEL
Freq: Three times a day (TID) | OROMUCOSAL | Status: DC | PRN
  Filled 2024-01-21: qty 9

## 2024-01-21 NOTE — Group Note (Signed)
 Recreation Therapy Group Note   Group Topic:Other  Group Date: 01/21/2024 Start Time: 1000 End Time: 1045 Facilitators: Celestia Jeoffrey BRAVO, LRT, CTRS Location: Courtyard  Group Description: [employer]. LRT and patients played games of basketball, drew with chalk, and played corn hole while outside in the courtyard while getting fresh air and sunlight. Music was being played in the background. LRT and peers conversed about different games they have played before, what they do in their free time and anything else that is on their minds. LRT encouraged pts to drink water  after being outside, sweating and getting their heart rate up.  Goal Area(s) Addressed: Patient will build on frustration tolerance skills. Patients will partake in a competitive play game with peers. Patients will gain knowledge of new leisure interest/hobby.    Affect/Mood: Full range   Participation Level: Moderate   Participation Quality: Minimal Cues   Behavior: Bizarre   Speech/Thought Process: Disorganized   Insight: Poor   Judgement: Poor   Modes of Intervention: Music, Open Conversation, Rapport Building, and Socialization   Patient Response to Interventions:  Disengaged   Education Outcome:  In group clarification offered    Clinical Observations/Individualized Feedback: Courtlyn was somewhat active in their participation of session activities and group discussion. Pt made many racists comments while outside. Pt requires frequent redirection.    Plan: Continue to engage patient in RT group sessions 2-3x/week.   Jeoffrey BRAVO Celestia, LRT, CTRS 01/21/2024 11:15 AM

## 2024-01-21 NOTE — Group Note (Signed)
 [facility] LCSW Group Therapy Note   Group Date: 01/21/2024 Start Time: 1300 End Time: 1400   Type of Therapy/Topic:  Group Therapy:  Emotion Regulation  Participation Level:  Did Not Attend   Mood:  Description of Group:    The purpose of this group is to assist patients in learning to regulate negative emotions and experience positive emotions. Patients will be guided to discuss ways in which they have been vulnerable to their negative emotions. These vulnerabilities will be juxtaposed with experiences of positive emotions or situations, and patients challenged to use positive emotions to combat negative ones. Special emphasis will be placed on coping with negative emotions in conflict situations, and patients will process healthy conflict resolution skills.  Therapeutic Goals: Patient will identify two positive emotions or experiences to reflect on in order to balance out negative emotions:  Patient will label two or more emotions that they find the most difficult to experience:  Patient will be able to demonstrate positive conflict resolution skills through discussion or role plays:   Summary of Patient Progress:   Patient did not attend group.     Therapeutic Modalities:   Cognitive Behavioral Therapy Feelings Identification Dialectical Behavioral Therapy   Alveta CHRISTELLA Kerns, LCSW

## 2024-01-21 NOTE — Group Note (Unsigned)
 Date:  01/21/2024 Time:  9:46 PM  Group Topic/Focus:  Wrap-Up Group:   The focus of this group is to help patients review their daily goal of treatment and discuss progress on daily workbooks.     Participation Level:  {[facility] PARTICIPATION OZCZO:77735}  Participation Quality:  {[facility] PARTICIPATION QUALITY:22265}  Affect:  {[facility] AFFECT:22266}  Cognitive:  {[facility] COGNITIVE:22267}  Insight: {[facility] Insight2:20797}  Engagement in Group:  {[facility] ENGAGEMENT IN HMNLE:77731}  Modes of Intervention:  {[facility] MODES OF INTERVENTION:22269}  Additional Comments:  ***  Allison Bolton 01/21/2024, 9:46 PM

## 2024-01-21 NOTE — Plan of Care (Signed)

## 2024-01-21 NOTE — Progress Notes (Signed)
 [facility] MD Progress Note  01/21/2024 2:51 PM Allison Bolton  MRN:  [id number]  53 year old female with medical history of asthma, fibromyalgia, diabetes mellitus, ovarian and cervical cancer, possible colon cancer, and Mnire's disease, as well as psychiatric history of bipolar disorder and polysubstance abuse, presented to the emergency department with bizarre and chaotic behavior, gross disorganization, and positive urine drug screen for benzodiazepines, amphetamines, and THC. She was administered Geodon  and Ativan  in the ED.   Subjective:  Chart reviewed, case discussed in multidisciplinary meeting, patient seen during rounds.   7/17: On follow-up patient presents with ongoing psychosis.  With agitation and aggression she has not been physically aggressive towards individuals but she is easily agitated and impulsive.  She is observed responding to internal stimuli.  She requires frequent redirection and is not easily redirectable.  She appears to be tolerating medications well without adverse effects.  Staff notes she is sleeping.  7/16: Patient continues with overt psychosis.  They require frequent redirection and are not easily redirectable they have required multiple PRNs for agitation.  They continue to fixate on their love for black and brown people they are also fixated on moving to [facility] .  They do not meaningfully participate in exam.  7/15: Patient continues to be overtly psychotic they are disorganized on exam continue to make frequent references to their brown family and loving brown people they also today are making references to how they voted for Trump and that they are a proud American. They at times discussed how they want to return to [facility] [place]  and then talk about how they are a resident of [place]  they do not meaningfully participate in the exam.  7/14: On exam patient is disorganized and requires frequent redirection.  They are frequently mentioning  the brown family and how they have brown people's backs.  They discussed how all of their children are biracial.  They are not irritable today.  Diagnostic clarity remains uncertain however if secondary to psychoactive substance would expect to resolve.  However given severely impaired judgment patient is danger to himself and others and requires continued psychiatric hospitalization will titrate up on Zyprexa   Sleep: UTA  Appetite:  Fair  Past Psychiatric History: see h&P Family History:  Family History  Problem Relation Age of Onset   Thyroid  disease Mother    Colon cancer Father    Breast cancer Maternal Aunt    Colon cancer Brother    Colon cancer Other        paternal and maternal grandfather   Meniere's disease Other    Anesthesia problems Neg Hx    Hypotension Neg Hx    Malignant hyperthermia Neg Hx    Pseudochol deficiency Neg Hx    Social History:  Social History   Substance and Sexual Activity  Alcohol Use No   Comment: not since June 2012     Social History   Substance and Sexual Activity  Drug Use No   Comment: patient denies any-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    Social History   Socioeconomic History   Marital status: Legally Separated    Spouse name: Not on file   Number of children: Not on file   Years of education: Not on file   Highest education level: Not on file  Occupational History   Not on file  Tobacco Use   Smoking status: Every Day    Current packs/day: 0.00    Average packs/day: 0.5 packs/day  for 37.3 years (18.7 ttl pk-yrs)    Types: Cigarettes    Start date: 07/07/1981    Last attempt to quit: 11/05/2018    Years since quitting: 5.2   Smokeless tobacco: Never  Vaping Use   Vaping status: Never Used  Substance and Sexual Activity   Alcohol use: No    Comment: not since June 2012   Drug use: No    Comment: patient denies any-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   Sexual activity: Never    Birth control/protection:  Surgical  Other Topics Concern   Not on file  Social History Narrative   Not on file   Social Drivers of Health   Financial Resource Strain: Not on file  Food Insecurity: Food Insecurity Present (01/15/2024)   Hunger Vital Sign    Worried About Running Out of Food in the Last Year: Often true    Ran Out of Food in the Last Year: Often true  Transportation Needs: Unmet Transportation Needs (01/15/2024)   PRAPARE - Administrator, Civil Service (Medical): Yes    Lack of Transportation (Non-Medical): Yes  Physical Activity: Not on file  Stress: Not on file  Social Connections: Unknown (10/14/2021)   Received from [facility]   Social Connections    Do your friends and family support you?: Not on file    What agencies support you?: Not on file   Past Medical History:  Past Medical History:  Diagnosis Date   Arthritis    Asthma    Bipolar 1 disorder (HCC)    Diverticulosis    Dysrhythmia    sts I have heart palpitations   Fibromyalgia    H/O hiatal hernia    4   Headache(784.0)    High blood pressure    Meniere's disease    S/P colonoscopy    Dr. Golda 2010: few small diverticula at sigmoid. otherwise normal.    Shortness of breath     Past Surgical History:  Procedure Laterality Date   ABDOMINAL HYSTERECTOMY     APPENDECTOMY     CESAREAN SECTION     CHOLECYSTECTOMY     COLONOSCOPY N/A 12/25/2023   Procedure: COLONOSCOPY;  Surgeon: Eartha Angelia Sieving, MD;  Location: [facility];  Service: Gastroenterology;  Laterality: N/A;  11:30am, asa 1   exploratory laparoscopy     FOOT SURGERY     HEMORRHOID SURGERY     LAPAROSCOPIC APPENDECTOMY  02/19/2011   Procedure: APPENDECTOMY LAPAROSCOPIC;  Surgeon: Oneil DELENA Budge;  Location: [facility];  Service: General;  Laterality: N/A;   LAPAROSCOPY  02/19/2011   Procedure: LAPAROSCOPY DIAGNOSTIC;  Surgeon: Oneil DELENA Budge;  Location: [facility];  Service: General;  Laterality: N/A;   left ovarian removal      multiple hernia repairs     Right ovarian removal     TONSILLECTOMY     TONSILLECTOMY AND ADENOIDECTOMY     tubes in ears     UMBILICAL HERNIA REPAIR  Dec 2011   Dr. Budge   wisdom teeth removal      Current Medications: Current Facility-Administered Medications  Medication Dose Route Frequency Provider Last Rate Last Admin   acetaminophen  (TYLENOL ) tablet 650 mg  650 mg Oral Q6H PRN Bobbitt, Shalon E, NP   650 mg at 01/21/24 0845   alum & mag hydroxide-simeth (MAALOX/MYLANTA) 200-200-20 MG/5ML suspension 30 mL  30 mL Oral Q4H PRN Bobbitt, Shalon E, NP   30 mL  at 01/20/24 9175   cloNIDine  (CATAPRES ) tablet 0.1 mg  0.1 mg Oral Daily Laurita Peron E, PA-C   0.1 mg at 01/21/24 9155   haloperidol  (HALDOL ) tablet 5 mg  5 mg Oral TID PRN Bobbitt, Shalon E, NP   5 mg at 01/21/24 9066   And   diphenhydrAMINE  (BENADRYL ) capsule 50 mg  50 mg Oral TID PRN Bobbitt, Shalon E, NP   50 mg at 01/21/24 0933   haloperidol  lactate (HALDOL ) injection 5 mg  5 mg Intramuscular TID PRN Bobbitt, Shalon E, NP       And   diphenhydrAMINE  (BENADRYL ) injection 50 mg  50 mg Intramuscular TID PRN Bobbitt, Shalon E, NP       And   LORazepam  (ATIVAN ) injection 2 mg  2 mg Intramuscular TID PRN Bobbitt, Shalon E, NP       haloperidol  lactate (HALDOL ) injection 10 mg  10 mg Intramuscular TID PRN Bobbitt, Shalon E, NP   10 mg at 01/20/24 1413   And   diphenhydrAMINE  (BENADRYL ) injection 50 mg  50 mg Intramuscular TID PRN Bobbitt, Shalon E, NP   50 mg at 01/20/24 1414   And   LORazepam  (ATIVAN ) injection 2 mg  2 mg Intramuscular TID PRN Bobbitt, Shalon E, NP   2 mg at 01/20/24 1414   divalproex  (DEPAKOTE ) DR tablet 500 mg  500 mg Oral Q12H Manika Hast E, PA-C   500 mg at 01/21/24 9155   fluPHENAZine  (PROLIXIN ) tablet 5 mg  5 mg Oral BID Gabriela Giannelli E, PA-C       hydrOXYzine  (ATARAX ) tablet 25 mg  25 mg Oral TID PRN Bobbitt, Shalon E, NP   25 mg at 01/21/24 0606   magnesium  hydroxide (MILK OF  MAGNESIA) suspension 30 mL  30 mL Oral Daily PRN Bobbitt, Shalon E, NP       nicotine  (NICODERM CQ  - dosed in mg/24 hours) patch 14 mg  14 mg Transdermal Daily Jadapalle, Sree, MD   14 mg at 01/21/24 0845   nicotine  polacrilex (NICORETTE ) gum 2 mg  2 mg Oral PRN Jadapalle, Sree, MD   2 mg at 01/21/24 0844   OLANZapine  (ZYPREXA ) tablet 10 mg  10 mg Oral BID Alegandra Sommers E, PA-C   10 mg at 01/21/24 0844   ondansetron  (ZOFRAN -ODT) disintegrating tablet 4 mg  4 mg Oral Q8H PRN Cleotilde Hoy HERO, NP   4 mg at 01/21/24 9392   phenylephrine -shark liver oil-mineral oil-petrolatum  (PREPARATION H) rectal ointment 1 Application  1 Application Rectal BID PRN Renea Schoonmaker E, PA-C   1 Application at 01/19/24 2257   traZODone  (DESYREL ) tablet 50 mg  50 mg Oral QHS PRN Bobbitt, Shalon E, NP   50 mg at 01/20/24 2209    Lab Results:  No results found for this or any previous visit (from the past 48 hours).   Blood Alcohol level:  Lab Results  Component Value Date   [facility] <15 01/14/2024   ETH <10 05/07/2019    Metabolic Disorder Labs: Lab Results  Component Value Date   HGBA1C 5.1 01/18/2024   MPG 99.67 01/18/2024   MPG 105.41 05/11/2019   Lab Results  Component Value Date   PROLACTIN 24.1 (H) 05/11/2019   Lab Results  Component Value Date   CHOL 154 01/18/2024   TRIG 180 (H) 01/18/2024   HDL 49 01/18/2024   CHOLHDL 3.1 01/18/2024   VLDL 36 01/18/2024   LDLCALC 69 01/18/2024   LDLCALC 82 05/11/2019  Physical Findings: AIMS:  , ,  ,  ,    CIWA:    COWS:      Psychiatric Specialty Exam:  Presentation  General Appearance:  Disheveled  Eye Contact: Fair  Speech: Normal Rate; Clear and Coherent  Speech Volume: Decreased    Mood and Affect  Mood: Anxious; Depressed  Affect: Flat   Thought Process  Thought Processes: Disorganized  Descriptions of Associations:Circumstantial  Orientation:Full (Time, Place and Person)  Thought Content:Obsessions;  Rumination  Hallucinations:No data recorded  Ideas of Reference:Paranoia; Percusatory  Suicidal Thoughts:No data recorded  Homicidal Thoughts:No data recorded   Sensorium  Memory:No data recorded Judgment: Poor  Insight: Poor   Executive Functions  Concentration: Fair  Attention Span: Poor  Recall: Good  Fund of Knowledge: Good  Language: Good   Psychomotor Activity  Psychomotor Activity: No data recorded  Musculoskeletal: Strength & Muscle Tone: within normal limits Gait & Station: normal Assets  Assets:No data recorded   Physical Exam: Physical Exam Vitals and nursing note reviewed.  HENT:     Head: Atraumatic.  Eyes:     Extraocular Movements: Extraocular movements intact.  Pulmonary:     Effort: Pulmonary effort is normal.  Neurological:     Mental Status: She is alert and oriented to person, place, and time.    Review of Systems  Psychiatric/Behavioral:  Positive for hallucinations.    Blood pressure (!) 124/91, pulse 90, temperature (!) 97.4 F (36.3 C), resp. rate 20, height 5' 6 (1.676 m), weight 77.1 kg, SpO2 95%. Body mass index is 27.44 kg/m.  Diagnosis: Principal Problem:   Psychoactive substance-induced psychosis (HCC) Active Problems:   Bipolar disorder (HCC)   Substance induced mood disorder (HCC)   PLAN: Safety and Monitoring:  -- Voluntary admission to inpatient psychiatric unit for safety, stabilization and treatment  -- Daily contact with patient to assess and evaluate symptoms and progress in treatment  -- Patient's case to be discussed in multi-disciplinary team meeting  -- Observation Level : q15 minute checks  -- Vital signs:  q12 hours  -- Precautions: suicide, elopement, and assault -- Encouraged patient to participate in unit milieu and in scheduled group therapies  2. Psychiatric Diagnoses and Treatment:  Given ongoing psychosis and after discussion with attending psychiatrist will add second  antipsychotic. Continue Zyprexa  to 10 mg twice daily Continue clonidine  0.1 mg daily for impulsivity and agitation Continue Depakote  500 mg BID has tolerated in the past.  Start prolixin  5 mg bid  The risks/benefits/side-effects/alternatives to this medication were discussed in detail with the patient and time was given for questions. The patient consents to medication trial. -- Metabolic profile and EKG monitoring obtained while on an atypical antipsychotic  -- Encouraged patient to participate in unit milieu and in scheduled group therapies     3. Medical Issues Being Addressed:    no acute needs at this time, medically cleared in ED for psychiatric care  4. Discharge Planning:   -- Social work and case management to assist with discharge planning and identification of hospital follow-up needs prior to discharge  -- Estimated LOS: 3-4 days  Donnice FORBES Right, PA-C 01/21/2024, 2:51 PM

## 2024-01-21 NOTE — Progress Notes (Signed)
 Pt presented with a better affect last night than the night before when this writer had her. Pt compliant with medication administration per MD orders. Pt slept all night. Pt being monitored Q 15 minutes for safety per unit protocol, remains safe on the unit

## 2024-01-21 NOTE — Progress Notes (Signed)
   01/21/24 1800  Psych Admission Type (Psych Patients Only)  Admission Status Voluntary  Psychosocial Assessment  Patient Complaints Anxiety;Depression;Disorientation;Restlessness  Eye Contact Watchful;Staring  Facial Expression Anxious  Affect Labile;Preoccupied (patient is preoccupied with family members numbers and her teeth)  Speech Loud (loud at times throughout the day)  Interaction Needy (patient has been up to the medication room/nurses station multiple times throughout the day)  Motor Activity Slow  Appearance/Hygiene Body odor;Bizarre;Poor hygiene  Behavior Characteristics Impulsive;Restless  Mood Labile;Preoccupied (patient labile, went off this morning while talking to the provider.)  Aggressive Behavior  Effect No apparent injury  Thought Process  Coherency Disorganized;Flight of ideas;Loose associations;Tangential  Content Preoccupation  Delusions None reported or observed  Perception Depersonalization;Derealization  Hallucination None reported or observed  Judgment Poor  Confusion Moderate  Danger to Self  Current suicidal ideation? Denies  Agreement Not to Harm Self Yes  Description of Agreement Verbal  Danger to Others  Danger to Others None reported or observed

## 2024-01-21 NOTE — Group Note (Signed)
 Recreation Therapy Group Note   Group Topic:Coping Skills  Group Date: 01/21/2024 Start Time: 1530 End Time: 1620 Facilitators: Celestia Jeoffrey FORBES ARTICE, CTRS Location: Craft Room  Group Description: Coping A-Z. LRT and patients engage in a guided discussion on what coping skills are and gave specific examples. LRT passed out a handout labeled Coping A-Z with blank spaces beside each letter. LRT prompted patients to come up with a coping skill for each of the letters. LRT and patients went over the handout and gave ideas for each letter if anyone had any blanks left on their paper. Patients kept this handout with them that listed 26 different coping skills.   Goal Area(s) Addressed: Patients will be able to define "coping skills". Patient will identify new coping skills.  Patient will increase communication.   Affect/Mood: N/A   Participation Level: Minimal    Clinical Observations/Individualized Feedback: Allison Bolton originally came to group. Pt was noted to be asleep while sitting up. Pt woke up and said she needed to go lay down. Pt did not return.   Plan: Continue to engage patient in RT group sessions 2-3x/week.   Jeoffrey FORBES Celestia, LRT, CTRS 01/21/2024 4:59 PM

## 2024-01-21 NOTE — Therapy (Signed)
 PHYSICAL THERAPY DISCHARGE SUMMARY  Visits from Start of Care: 2  Current functional level related to goals / functional outcomes: See last visit on 01/06/24   Remaining deficits: See last visit on 01/06/24   Education / Equipment: See last visit on 01/06/24   Patient agrees to discharge. Patient goals were not met. Patient is being discharged due to not returning since the last visit. Patient discharged per No Show policy.   2:11 PM, 01/21/24 Rosaria Settler, PT, DPT [facility]

## 2024-01-21 NOTE — Plan of Care (Signed)
   Problem: Education: Goal: Emotional status will improve Outcome: Progressing Goal: Mental status will improve Outcome: Progressing

## 2024-01-21 NOTE — Progress Notes (Signed)
 Pt presents disorganized and preoccupied. Denies SI/HI/AVH. Pt is redirectable and was observed by this Clinical research associate interacting appropriately with staff and peers on the unit. Pt given education, support, and encouragement to be active in her treatment plan. Pt being monitored Q 15 minutes for safety per unit protocol, remains safe on the unit

## 2024-01-21 NOTE — BHH Counselor (Signed)
 CSW attempted contact with daughter, [name] Jenkins Porch [phone]) per pt request. Contact was unable to be established and message was unable to be left as mailbox was full.   Nadara SAUNDERS. Chaim, MSW, LCSW, LCAS 01/21/2024 11:04 AM

## 2024-01-21 NOTE — Group Note (Signed)
 Date:  01/21/2024 Time:  8:57 PM  Group Topic/Focus:  Goals Group:   The focus of this group is to help patients establish daily goals to achieve during treatment and discuss how the patient can incorporate goal setting into their daily lives to aide in recovery.    Participation Level:  Active  Participation Quality:  Appropriate, Redirectable, and Sharing  Affect:  Anxious and Appropriate  Cognitive:  Appropriate  Insight: Appropriate  Engagement in Group:  Distracting and Engaged  Modes of Intervention:  Discussion and Role-play  Additional Comments:     Kerri Katz 01/21/2024, 8:57 PM

## 2024-01-21 NOTE — Progress Notes (Signed)
 Pt continues to come to the nurses station with somatic issues. Complaining about teeth pain, after she was given pain medication per MD orders. Pt given PRN medications. Will continue to monitor the patient. Pt remains safe on the unit

## 2024-01-21 NOTE — Group Note (Signed)
 Date:  01/21/2024 Time:  10:43 AM  Group Topic/Focus:   Goals Group:   The focus of this group is to help patients establish daily goals to achieve during treatment and discuss how the patient can incorporate goal setting into their daily lives to aide in recovery.  Self Care:   The focus of this group is to help patients understand the importance of self-care in order to improve or restore emotional, physical, spiritual, interpersonal, and financial health.  Participation Level:  Active  Participation Quality:  Appropriate  Affect:  Appropriate  Cognitive:  Disorganized  Insight: Limited  Engagement in Group:  Engaged  Modes of Intervention:  Activity, Discussion, and Education  Additional Comments:    Edman Lipsey A Joseff Luckman 01/21/2024, 10:43 AM

## 2024-01-22 DIAGNOSIS — F19959 Other psychoactive substance use, unspecified with psychoactive substance-induced psychotic disorder, unspecified: Secondary | ICD-10-CM | POA: Diagnosis not present

## 2024-01-22 NOTE — Group Note (Signed)
 Date:  01/22/2024 Time:  2:54 PM  Group Topic/Focus:  Conflict Resolution:   The focus of this group is to discuss the conflict resolution process and how it may be used upon discharge.    Participation Level:  Did Not Attend   Camellia HERO Ashlin Hidalgo 01/22/2024, 2:54 PM

## 2024-01-22 NOTE — Group Note (Signed)
 Recreation Therapy Group Note   Group Topic:Relaxation  Group Date: 01/22/2024 Start Time: 1530 End Time: 1620 Facilitators: Celestia Jeoffrey FORBES ARTICE, CTRS Location: Craft Room  Group Description: Meditation. LRT and patients discussed what they know about meditation and mindfulness. LRT played a Deep Breathing Meditation exercise script for patients to follow along to. LRT and patients discussed how meditation and deep breathing can be used as a coping skill post--discharge to help manage symptoms of stress.   Goal Area(s) Addressed: Patient will practice using relaxation technique. Patient will identify a new coping skill.  Patient will follow multistep directions to reduce anxiety and stress.   Affect/Mood: N/A   Participation Level: Did not attend    Clinical Observations/Individualized Feedback: Patient did not attend group.  Plan: Continue to engage patient in RT group sessions 2-3x/week.   Jeoffrey FORBES Celestia, LRT, CTRS 01/22/2024 5:10 PM

## 2024-01-22 NOTE — Progress Notes (Signed)
   01/22/24 1500  Psych Admission Type (Psych Patients Only)  Admission Status Voluntary  Psychosocial Assessment  Patient Complaints Anxiety;Disorientation  Eye Contact Watchful  Facial Expression Anxious  Affect Preoccupied  Speech Loud  Interaction Minimal (Remained in room for most of the shift. Pleasant upon approach, but did not initiate conversations.)  Motor Activity Slow  Appearance/Hygiene Poor hygiene;Disheveled  Behavior Characteristics Cooperative  Mood Preoccupied  Thought Process  Coherency Disorganized  Content Preoccupation  Delusions None reported or observed  Perception Depersonalization  Hallucination None reported or observed  Judgment Poor  Confusion Mild  Danger to Self  Current suicidal ideation? Denies  Agreement Not to Harm Self Yes  Description of Agreement Verbal  Danger to Others  Danger to Others None reported or observed   Gaylen appears to be less disorganized and significantly less agitated this shift. Remained in her room for most of the shift but was pleasant upon approach. Oriented to person, place, and time, but insight into the events that led to her hospitalization dismissed as, some stupid things I guess. No PRNs requested nor required this shift. Denies SI. Denies AVH.

## 2024-01-22 NOTE — Plan of Care (Signed)
  Problem: Education: Goal: Emotional status will improve Outcome: Progressing   Problem: Education: Goal: Mental status will improve Outcome: Progressing   Problem: Activity: Goal: Sleeping patterns will improve Outcome: Progressing   Problem: Coping: Goal: Ability to verbalize frustrations and anger appropriately will improve Outcome: Progressing   Problem: Coping: Goal: Ability to demonstrate self-control will improve Outcome: Progressing   Problem: Health Behavior/Discharge Planning: Goal: Compliance with treatment plan for underlying cause of condition will improve Outcome: Progressing   Problem: Physical Regulation: Goal: Ability to maintain clinical measurements within normal limits will improve Outcome: Progressing

## 2024-01-22 NOTE — Progress Notes (Signed)
 [facility] MD Progress Note  01/22/2024 3:31 PM Allison Bolton  MRN:  [id number]  53 year old female with medical history of asthma, fibromyalgia, diabetes mellitus, ovarian and cervical cancer, possible colon cancer, and Mnire's disease, as well as psychiatric history of bipolar disorder and polysubstance abuse, presented to the emergency department with bizarre and chaotic behavior, gross disorganization, and positive urine drug screen for benzodiazepines, amphetamines, and THC. She was administered Geodon  and Ativan  in the ED.   Subjective:  Chart reviewed, case discussed in multidisciplinary meeting, patient seen during rounds.  7/18: On follow-up today patient is found laying in her bed.  She is alert and oriented to self situation and location.  She is cooperative with the assessment today.  She is more linear on exam today and has not had frequent behavioral outburst.  On previous days she had been observed walking around the unit yelling and acted impulsively however today she is calm.  Discussed medications discussed need for lab work in the coming days.  Patient denies suicidal and homicidal thoughts.  There is still some disorganization and I still have impaired judgment they are progressing.  Will continue current medications.  Valproic  acid level scheduled for 7/20.  Patient continues to require inpatient psychiatric hospitalization for medication management and stabilization due to severely impaired judgment.  7/17: On follow-up patient presents with ongoing psychosis.  With agitation and aggression she has not been physically aggressive towards individuals but she is easily agitated and impulsive.  She is observed responding to internal stimuli.  She requires frequent redirection and is not easily redirectable.  She appears to be tolerating medications well without adverse effects.  Staff notes she is sleeping.  7/16: Patient continues with overt psychosis.  They require frequent  redirection and are not easily redirectable they have required multiple PRNs for agitation.  They continue to fixate on their love for black and brown people they are also fixated on moving to [facility] [place] .  They do not meaningfully participate in exam.  7/15: Patient continues to be overtly psychotic they are disorganized on exam continue to make frequent references to their brown family and loving brown people they also today are making references to how they voted for Trump and that they are a proud American. They at times discussed how they want to return to [facility] [place]  and then talk about how they are a resident of [place]  they do not meaningfully participate in the exam.  7/14: On exam patient is disorganized and requires frequent redirection.  They are frequently mentioning the brown family and how they have brown people's backs.  They discussed how all of their children are biracial.  They are not irritable today.  Diagnostic clarity remains uncertain however if secondary to psychoactive substance would expect to resolve.  However given severely impaired judgment patient is danger to himself and others and requires continued psychiatric hospitalization will titrate up on Zyprexa   Sleep: UTA  Appetite:  Fair  Past Psychiatric History: see h&P Family History:  Family History  Problem Relation Age of Onset   Thyroid  disease Mother    Colon cancer Father    Breast cancer Maternal Aunt    Colon cancer Brother    Colon cancer Other        paternal and maternal grandfather   Meniere's disease Other    Anesthesia problems Neg Hx    Hypotension Neg Hx    Malignant hyperthermia Neg Hx    Pseudochol deficiency Neg Hx  Social History:  Social History   Substance and Sexual Activity  Alcohol Use No   Comment: not since June 2012     Social History   Substance and Sexual Activity  Drug Use No   Comment: patient denies any-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     Social History   Socioeconomic History   Marital status: Legally Separated    Spouse name: Not on file   Number of children: Not on file   Years of education: Not on file   Highest education level: Not on file  Occupational History   Not on file  Tobacco Use   Smoking status: Every Day    Current packs/day: 0.00    Average packs/day: 0.5 packs/day for 37.3 years (18.7 ttl pk-yrs)    Types: Cigarettes    Start date: 07/07/1981    Last attempt to quit: 11/05/2018    Years since quitting: 5.2   Smokeless tobacco: Never  Vaping Use   Vaping status: Never Used  Substance and Sexual Activity   Alcohol use: No    Comment: not since June 2012   Drug use: No    Comment: patient denies any-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   Sexual activity: Never    Birth control/protection: Surgical  Other Topics Concern   Not on file  Social History Narrative   Not on file   Social Drivers of Health   Financial Resource Strain: Not on file  Food Insecurity: Food Insecurity Present (01/15/2024)   Hunger Vital Sign    Worried About Running Out of Food in the Last Year: Often true    Ran Out of Food in the Last Year: Often true  Transportation Needs: Unmet Transportation Needs (01/15/2024)   PRAPARE - Administrator, Civil Service (Medical): Yes    Lack of Transportation (Non-Medical): Yes  Physical Activity: Not on file  Stress: Not on file  Social Connections: Unknown (10/14/2021)   Received from [facility]   Social Connections    Do your friends and family support you?: Not on file    What agencies support you?: Not on file   Past Medical History:  Past Medical History:  Diagnosis Date   Arthritis    Asthma    Bipolar 1 disorder (HCC)    Diverticulosis    Dysrhythmia    sts I have heart palpitations   Fibromyalgia    H/O hiatal hernia    4   Headache(784.0)    High blood pressure    Meniere's disease    S/P colonoscopy    Dr. Golda  2010: few small diverticula at sigmoid. otherwise normal.    Shortness of breath     Past Surgical History:  Procedure Laterality Date   ABDOMINAL HYSTERECTOMY     APPENDECTOMY     CESAREAN SECTION     CHOLECYSTECTOMY     COLONOSCOPY N/A 12/25/2023   Procedure: COLONOSCOPY;  Surgeon: Eartha Angelia Sieving, MD;  Location: [facility];  Service: Gastroenterology;  Laterality: N/A;  11:30am, asa 1   exploratory laparoscopy     FOOT SURGERY     HEMORRHOID SURGERY     LAPAROSCOPIC APPENDECTOMY  02/19/2011   Procedure: APPENDECTOMY LAPAROSCOPIC;  Surgeon: Oneil DELENA Budge;  Location: [facility];  Service: General;  Laterality: N/A;   LAPAROSCOPY  02/19/2011   Procedure: LAPAROSCOPY DIAGNOSTIC;  Surgeon: Oneil DELENA Budge;  Location: [facility];  Service: General;  Laterality: N/A;   left ovarian removal     multiple hernia repairs     Right ovarian removal     TONSILLECTOMY     TONSILLECTOMY AND ADENOIDECTOMY     tubes in ears     UMBILICAL HERNIA REPAIR  Dec 2011   Dr. Mavis   wisdom teeth removal      Current Medications: Current Facility-Administered Medications  Medication Dose Route Frequency Provider Last Rate Last Admin   acetaminophen  (TYLENOL ) tablet 650 mg  650 mg Oral Q6H PRN Bobbitt, Shalon E, NP   650 mg at 01/22/24 0547   alum & mag hydroxide-simeth (MAALOX/MYLANTA) 200-200-20 MG/5ML suspension 30 mL  30 mL Oral Q4H PRN Bobbitt, Shalon E, NP   30 mL at 01/20/24 0824   benzocaine  (ORAJEL) 10 % mucosal gel   Mouth/Throat TID PRN Trudy Carwin, NP       cloNIDine  (CATAPRES ) tablet 0.1 mg  0.1 mg Oral Daily Shela Esses E, PA-C   0.1 mg at 01/22/24 9192   haloperidol  (HALDOL ) tablet 5 mg  5 mg Oral TID PRN Bobbitt, Shalon E, NP   5 mg at 01/21/24 2155   And   diphenhydrAMINE  (BENADRYL ) capsule 50 mg  50 mg Oral TID PRN Bobbitt, Shalon E, NP   50 mg at 01/21/24 2155   haloperidol  lactate (HALDOL ) injection 5 mg  5 mg Intramuscular TID PRN Bobbitt, Shalon E, NP       And    diphenhydrAMINE  (BENADRYL ) injection 50 mg  50 mg Intramuscular TID PRN Bobbitt, Shalon E, NP       And   LORazepam  (ATIVAN ) injection 2 mg  2 mg Intramuscular TID PRN Bobbitt, Shalon E, NP       haloperidol  lactate (HALDOL ) injection 10 mg  10 mg Intramuscular TID PRN Bobbitt, Shalon E, NP   10 mg at 01/20/24 1413   And   diphenhydrAMINE  (BENADRYL ) injection 50 mg  50 mg Intramuscular TID PRN Bobbitt, Shalon E, NP   50 mg at 01/20/24 1414   And   LORazepam  (ATIVAN ) injection 2 mg  2 mg Intramuscular TID PRN Bobbitt, Shalon E, NP   2 mg at 01/21/24 2304   divalproex  (DEPAKOTE ) DR tablet 500 mg  500 mg Oral Q12H Aynslee Mulhall E, PA-C   500 mg at 01/22/24 9192   fluPHENAZine  (PROLIXIN ) tablet 5 mg  5 mg Oral BID Evie Croston E, PA-C   5 mg at 01/22/24 9192   hydrOXYzine  (ATARAX ) tablet 25 mg  25 mg Oral TID PRN Bobbitt, Shalon E, NP   25 mg at 01/21/24 0606   magnesium  hydroxide (MILK OF MAGNESIA) suspension 30 mL  30 mL Oral Daily PRN Bobbitt, Shalon E, NP       nicotine  (NICODERM CQ  - dosed in mg/24 hours) patch 14 mg  14 mg Transdermal Daily Jadapalle, Sree, MD   14 mg at 01/22/24 9192   nicotine  polacrilex (NICORETTE ) gum 2 mg  2 mg Oral PRN Jadapalle, Sree, MD   2 mg at 01/21/24 0844   OLANZapine  (ZYPREXA ) tablet 10 mg  10 mg Oral BID Lander Eslick E, PA-C   10 mg at 01/22/24 9192   ondansetron  (ZOFRAN -ODT) disintegrating tablet 4 mg  4 mg Oral Q8H PRN Cleotilde Hoy HERO, NP   4 mg at 01/21/24 2056   phenylephrine -shark liver oil-mineral oil-petrolatum  (PREPARATION H) rectal ointment 1 Application  1 Application Rectal BID PRN Tatayana Beshears E, PA-C   1 Application at 01/19/24 2257  traZODone  (DESYREL ) tablet 50 mg  50 mg Oral QHS PRN Bobbitt, Shalon E, NP   50 mg at 01/21/24 2055    Lab Results:  No results found for this or any previous visit (from the past 48 hours).   Blood Alcohol level:  Lab Results  Component Value Date   [facility] <15 01/14/2024   ETH <10  05/07/2019    Metabolic Disorder Labs: Lab Results  Component Value Date   HGBA1C 5.1 01/18/2024   MPG 99.67 01/18/2024   MPG 105.41 05/11/2019   Lab Results  Component Value Date   PROLACTIN 24.1 (H) 05/11/2019   Lab Results  Component Value Date   CHOL 154 01/18/2024   TRIG 180 (H) 01/18/2024   HDL 49 01/18/2024   CHOLHDL 3.1 01/18/2024   VLDL 36 01/18/2024   LDLCALC 69 01/18/2024   LDLCALC 82 05/11/2019    Physical Findings: AIMS:  , ,  ,  ,    CIWA:    COWS:      Psychiatric Specialty Exam:  Presentation  General Appearance:  Disheveled  Eye Contact: Fair  Speech: Normal Rate; Clear and Coherent  Speech Volume: Decreased    Mood and Affect  Mood: Anxious; Depressed  Affect: Flat   Thought Process  Thought Processes: Disorganized  Descriptions of Associations:Circumstantial  Orientation:Full (Time, Place and Person)  Thought Content:Obsessions; Rumination  Hallucinations:No data recorded  Ideas of Reference:Paranoia; Percusatory  Suicidal Thoughts:No data recorded  Homicidal Thoughts:No data recorded   Sensorium  Memory:No data recorded Judgment: Poor  Insight: Poor   Executive Functions  Concentration: Fair  Attention Span: Poor  Recall: Good  Fund of Knowledge: Good  Language: Good   Psychomotor Activity  Psychomotor Activity: No data recorded  Musculoskeletal: Strength & Muscle Tone: within normal limits Gait & Station: normal Assets  Assets:No data recorded   Physical Exam: Physical Exam Vitals and nursing note reviewed.  HENT:     Head: Atraumatic.  Eyes:     Extraocular Movements: Extraocular movements intact.  Pulmonary:     Effort: Pulmonary effort is normal.  Neurological:     Mental Status: She is alert and oriented to person, place, and time.    Review of Systems  Psychiatric/Behavioral:  Positive for hallucinations.    Blood pressure (!) 136/94, pulse 89, temperature (!) 97.3  F (36.3 C), resp. rate 20, height 5' 6 (1.676 m), weight 77.1 kg, SpO2 100%. Body mass index is 27.44 kg/m.  Diagnosis: Principal Problem:   Psychoactive substance-induced psychosis (HCC) Active Problems:   Bipolar disorder (HCC)   Substance induced mood disorder (HCC)   PLAN: Safety and Monitoring:  -- Voluntary admission to inpatient psychiatric unit for safety, stabilization and treatment  -- Daily contact with patient to assess and evaluate symptoms and progress in treatment  -- Patient's case to be discussed in multi-disciplinary team meeting  -- Observation Level : q15 minute checks  -- Vital signs:  q12 hours  -- Precautions: suicide, elopement, and assault -- Encouraged patient to participate in unit milieu and in scheduled group therapies  2. Psychiatric Diagnoses and Treatment:  Patient with improvement today, will continue current course Continue Zyprexa  to 10 mg twice daily Continue clonidine  0.1 mg daily for impulsivity and agitation Continue Depakote  500 mg BID has tolerated in the past. Level ordered for 7/20 Continue prolixin  5 mg bid  The risks/benefits/side-effects/alternatives to this medication were discussed in detail with the patient and time was given for questions. The patient consents to  medication trial. -- Metabolic profile and EKG monitoring obtained while on an atypical antipsychotic  -- Encouraged patient to participate in unit milieu and in scheduled group therapies     3. Medical Issues Being Addressed:    no acute needs at this time, medically cleared in ED for psychiatric care  4. Discharge Planning:   -- Social work and case management to assist with discharge planning and identification of hospital follow-up needs prior to discharge  -- Estimated LOS: 3-4 days  Donnice FORBES Right, PA-C 01/22/2024, 3:31 PM

## 2024-01-22 NOTE — Group Note (Signed)
 Recreation Therapy Group Note   Group Topic:Stress Management  Group Date: 01/22/2024 Start Time: 1005 End Time: 1100 Facilitators: Celestia Jeoffrey BRAVO, LRT, CTRS Location: Courtyard  Group Description: [employer]. LRT and patients played games of basketball, drew with chalk, and played corn hole while outside in the courtyard while getting fresh air and sunlight. Music was being played in the background. LRT and peers conversed about different games they have played before, what they do in their free time and anything else that is on their minds. LRT encouraged pts to drink water  after being outside, sweating and getting their heart rate up.  Goal Area(s) Addressed: Patient will build on frustration tolerance skills. Patients will partake in a competitive play game with peers. Patients will gain knowledge of new leisure interest/hobby.    Affect/Mood: Appropriate   Participation Level: Minimal    Clinical Observations/Individualized Feedback: Allison Bolton came late to group and left early. Pt appeared drowsy.   Plan: Continue to engage patient in RT group sessions 2-3x/week.   Jeoffrey BRAVO Celestia, LRT, CTRS 01/22/2024 11:41 AM

## 2024-01-23 DIAGNOSIS — F19959 Other psychoactive substance use, unspecified with psychoactive substance-induced psychotic disorder, unspecified: Secondary | ICD-10-CM | POA: Diagnosis not present

## 2024-01-23 NOTE — Plan of Care (Signed)
   Problem: Education: Goal: Knowledge of [facility] Education information/materials will improve Outcome: Progressing Goal: Emotional status will improve Outcome: Progressing Goal: Mental status will improve Outcome: Progressing Goal: Verbalization of understanding the information provided will improve Outcome: Progressing

## 2024-01-23 NOTE — BH IP Treatment Plan (Signed)
 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[facility])  Secondary Diagnoses: Principal Problem:   Psychoactive substance-induced psychosis (HCC) Active Problems:   Bipolar disorder (HCC)   Substance induced mood disorder (HCC)   Current Medications:  Current Facility-Administered Medications  Medication Dose Route Frequency Provider Last Rate Last Admin   acetaminophen  (TYLENOL ) tablet 650 mg  650 mg Oral Q6H PRN Bobbitt, Shalon E, NP   650 mg at 01/23/24 0833   alum & mag hydroxide-simeth (MAALOX/MYLANTA) 200-200-20 MG/5ML suspension 30 mL  30 mL Oral Q4H PRN Bobbitt, Shalon E, NP   30 mL at 01/22/24 2333   benzocaine  (ORAJEL) 10 % mucosal gel   Mouth/Throat TID PRN Trudy Carwin, NP   Given at 01/23/24 0243   cloNIDine  (CATAPRES ) tablet 0.1 mg  0.1 mg Oral Daily Millington, Matthew E, PA-C   0.1 mg at 01/23/24 9165   haloperidol  (HALDOL ) tablet 5 mg  5 mg Oral TID PRN Bobbitt, Shalon E, NP   5 mg at 01/21/24 2155   And   diphenhydrAMINE  (BENADRYL ) capsule 50 mg  50 mg Oral TID PRN Bobbitt, Shalon E, NP   50 mg at 01/21/24 2155   haloperidol  lactate (HALDOL ) injection 5 mg  5 mg Intramuscular TID PRN Bobbitt, Shalon E, NP       And   diphenhydrAMINE  (BENADRYL ) injection 50 mg  50 mg Intramuscular TID PRN Bobbitt, Shalon E, NP       And   LORazepam  (ATIVAN ) injection 2 mg  2 mg Intramuscular TID PRN Bobbitt, Shalon E, NP       haloperidol  lactate (HALDOL ) injection 10 mg  10 mg Intramuscular TID PRN Bobbitt, Shalon E, NP   10 mg at 01/20/24 1413   And   diphenhydrAMINE  (BENADRYL ) injection 50 mg  50 mg Intramuscular TID PRN Bobbitt, Shalon E, NP   50 mg at 01/20/24 1414   And   LORazepam  (ATIVAN ) injection 2 mg  2 mg Intramuscular TID PRN Bobbitt, Shalon E, NP   2 mg at 01/21/24 2304   divalproex  (DEPAKOTE ) DR tablet 500 mg  500 mg Oral Q12H  Millington, Matthew E, PA-C   500 mg at 01/23/24 9166   fluPHENAZine  (PROLIXIN ) tablet 5 mg  5 mg Oral BID Millington, Matthew E, PA-C   5 mg at 01/23/24 9166   hydrOXYzine  (ATARAX ) tablet 25 mg  25 mg Oral TID PRN Bobbitt, Shalon E, NP   25 mg at 01/23/24 0834   magnesium  hydroxide (MILK OF MAGNESIA) suspension 30 mL  30 mL Oral Daily PRN Bobbitt, Shalon E, NP       nicotine  (NICODERM CQ  - dosed in mg/24 hours) patch 14 mg  14 mg Transdermal Daily Jadapalle, Sree, MD   14 mg at 01/23/24 9165   nicotine  polacrilex (NICORETTE ) gum 2 mg  2 mg Oral PRN Jadapalle, Sree, MD   2 mg at 01/22/24 1924   OLANZapine  (ZYPREXA ) tablet 10 mg  10 mg Oral BID Millington, Matthew E, PA-C   10 mg at 01/23/24 9165   ondansetron  (ZOFRAN -ODT) disintegrating tablet 4 mg  4 mg Oral Q8H PRN Cleotilde Hoy HERO, NP   4 mg at 01/22/24 2147   phenylephrine -shark liver oil-mineral oil-petrolatum  (PREPARATION H) rectal ointment 1 Application  1 Application Rectal BID PRN Millington, Matthew E, PA-C   1 Application at 01/19/24 2257   traZODone  (DESYREL ) tablet 50 mg  50 mg  Oral QHS PRN Bobbitt, Shalon E, NP   50 mg at 01/22/24 2122   PTA Medications: Medications Prior to Admission  Medication Sig Dispense Refill Last Dose/Taking   acetaminophen  (TYLENOL ) 325 MG tablet Take 650 mg by mouth every 6 (six) hours as needed.      albuterol  (VENTOLIN  HFA) 108 (90 Base) MCG/ACT inhaler Inhale 2 puffs into the lungs every 4 (four) hours as needed for wheezing or shortness of breath.      benztropine  (COGENTIN ) 1 MG tablet Take 1 tablet (1 mg total) by mouth 2 (two) times daily. 60 tablet 2    carvedilol  (COREG ) 25 MG tablet Take 1 tablet (25 mg total) by mouth 2 (two) times daily with a meal. 60 tablet 3    divalproex  (DEPAKOTE ) 250 MG DR tablet Take 3 tablets (750 mg total) by mouth every evening. 90 tablet 2    gabapentin  (NEURONTIN ) 100 MG capsule Take 100 mg by mouth 3 (three) times daily.      hydrOXYzine  (ATARAX ) 25 MG tablet  Take 25 mg by mouth every 8 (eight) hours as needed for anxiety.      linaclotide  (LINZESS ) 72 MCG capsule Take 1 capsule (72 mcg total) by mouth daily before breakfast. 90 capsule 3    losartan (COZAAR) 25 MG tablet Take 25 mg by mouth daily.      OLANZapine  (ZYPREXA ) 10 MG tablet Take 10 mg by mouth at bedtime.      ondansetron  (ZOFRAN ) 4 MG tablet Take 4 mg by mouth every 6 (six) hours as needed.      ondansetron  (ZOFRAN -ODT) 4 MG disintegrating tablet Take 4 mg by mouth every 8 (eight) hours as needed.      QUEtiapine  (SEROQUEL ) 300 MG tablet Take 300 mg by mouth at bedtime.      tamsulosin (FLOMAX) 0.4 MG CAPS capsule Take 0.4 mg by mouth daily.      traZODone  (DESYREL ) 100 MG tablet Take 2 tablets (200 mg total) by mouth at bedtime as needed for sleep. 60 tablet 2     Patient Stressors: Financial difficulties   Health problems   Occupational concerns   Substance abuse   Traumatic event    Patient Strengths: Motivation for treatment/growth  Supportive family/friends   Treatment Modalities: Medication Management, Group therapy, Case management,  1 to 1 session with clinician, Psychoeducation, Recreational therapy.   Physician Treatment Plan for Primary Diagnosis: Psychoactive substance-induced psychosis (HCC) Long Term Goal(s): Improvement in symptoms so as ready for discharge   Short Term Goals: Ability to identify changes in lifestyle to reduce recurrence of condition will improve  Medication Management: Evaluate patient's response, side effects, and tolerance of medication regimen.  Therapeutic Interventions: 1 to 1 sessions, Unit Group sessions and Medication administration.  Evaluation of Outcomes: Progressing  Physician Treatment Plan for Secondary Diagnosis: Principal Problem:   Psychoactive substance-induced psychosis (HCC) Active Problems:   Bipolar disorder (HCC)   Substance induced mood disorder (HCC)  Long Term Goal(s): Improvement in symptoms so as ready for  discharge   Short Term Goals: Ability to identify changes in lifestyle to reduce recurrence of condition will improve     Medication Management: Evaluate patient's response, side effects, and tolerance of medication regimen.  Therapeutic Interventions: 1 to 1 sessions, Unit Group sessions and Medication administration.  Evaluation of Outcomes: Progressing   RN Treatment Plan for Primary Diagnosis: Psychoactive substance-induced psychosis (HCC) Long Term Goal(s): Knowledge of disease and therapeutic regimen to maintain health will improve  Short  Term Goals: Ability to remain free from injury will improve, Ability to verbalize frustration and anger appropriately will improve, Ability to demonstrate self-control, Ability to participate in decision making will improve, Ability to verbalize feelings will improve, Ability to disclose and discuss suicidal ideas, Ability to identify and develop effective coping behaviors will improve, and Compliance with prescribed medications will improve  Medication Management: RN will administer medications as ordered by provider, will assess and evaluate patient's response and provide education to patient for prescribed medication. RN will report any adverse and/or side effects to prescribing provider.  Therapeutic Interventions: 1 on 1 counseling sessions, Psychoeducation, Medication administration, Evaluate responses to treatment, Monitor vital signs and CBGs as ordered, Perform/monitor CIWA, COWS, AIMS and Fall Risk screenings as ordered, Perform wound care treatments as ordered.  Evaluation of Outcomes: Progressing   LCSW Treatment Plan for Primary Diagnosis: Psychoactive substance-induced psychosis (HCC) Long Term Goal(s): Safe transition to appropriate next level of care at discharge, Engage patient in therapeutic group addressing interpersonal concerns.  Short Term Goals: Engage patient in aftercare planning with referrals and resources, Increase social  support, Increase ability to appropriately verbalize feelings, Increase emotional regulation, Facilitate acceptance of mental health diagnosis and concerns, Facilitate patient progression through stages of change regarding substance use diagnoses and concerns, Identify triggers associated with mental health/substance abuse issues, and Increase skills for wellness and recovery  Therapeutic Interventions: Assess for all discharge needs, 1 to 1 time with Social worker, Explore available resources and support systems, Assess for adequacy in community support network, Educate family and significant other(s) on suicide prevention, Complete Psychosocial Assessment, Interpersonal group therapy.  Evaluation of Outcomes: Progressing   Progress in Treatment: Attending groups: Yes. and No. Participating in groups: Yes. and No. Taking medication as prescribed: Yes. Toleration medication: Yes. Family/Significant other contact made: No, will contact:  Once CSW is able to contact family. Patient understands diagnosis: Yes. Discussing patient identified problems/goals with staff: Yes. Medical problems stabilized or resolved: Yes. Denies suicidal/homicidal ideation: Yes. Issues/concerns per patient self-inventory: No. Other: None  New problem(s) identified: No, Describe:  none identified. Update 01/23/24: No changes at this time.    New Short Term/Long Term Goal(s): detox, elimination of symptoms of psychosis, medication management for mood stabilization; elimination of SI thoughts; development of comprehensive mental wellness/sobriety plan.   Update 01/23/24: No changes at this time.    Patient Goals: Work with everybody.    Update 01/23/24: No changes at this time.    Discharge Plan or Barriers: CSW will assist pt with development of an appropriate aftercare/discharge plan.   Update 01/23/24: No changes at this time.    Reason for Continuation of Hospitalization: Medication stabilization Other;  describe disorganized thought processes, and bizarre behavior.    Estimated Length of Stay: 1-7 days   Update 01/23/24: TBD   Last 3 [place] Suicide Severity Risk Score: Flowsheet Row Admission (Current) from 01/15/2024 in [facility]ICINE ED from 01/14/2024 in [facility] Emergency Department at [facility] Admission (Discharged) from 12/25/2023 in [facility]  C-SSRS RISK CATEGORY No Risk No Risk No Risk    Last PHQ 2/9 Scores:     No data to display          Scribe for Treatment Team: Roselyn GORMAN Lento, LCSW 01/23/2024 9:48 AM

## 2024-01-23 NOTE — Group Note (Signed)
 Date:  01/23/2024 Time:  11:37 AM  Group Topic/Focus:  Goals Group:   The focus of this group is to help patients establish daily goals to achieve during treatment and discuss how the patient can incorporate goal setting into their daily lives to aide in recovery.    Participation Level:  Did Not Attend   Deitra Clap [facility] 01/23/2024, 11:37 AM

## 2024-01-23 NOTE — Progress Notes (Signed)
   01/23/24 1000  Psych Admission Type (Psych Patients Only)  Admission Status Voluntary  Psychosocial Assessment  Patient Complaints Anxiety;Irritability;Restlessness  Eye Contact Fair  Facial Expression Anxious;Worried  Affect Anxious;Irritable  Speech Loud  Interaction Needy  Motor Activity Restless  Appearance/Hygiene In scrubs;Poor hygiene  Behavior Characteristics Anxious;Irritable;Pacing;Restless  Mood Anxious;Irritable  Thought Process  Coherency Disorganized  Content Preoccupation  Delusions None reported or observed  Perception Hallucinations  Hallucination Auditory  Judgment Poor  Confusion Mild  Danger to Self  Current suicidal ideation? Denies  Agreement Not to Harm Self Yes  Description of Agreement verbal  Danger to Others  Danger to Others None reported or observed

## 2024-01-23 NOTE — Progress Notes (Signed)
 [facility] MD Progress Note  01/23/2024 2:24 PM Allison Bolton  MRN:  [id number]  53 year old female with medical history of asthma, fibromyalgia, diabetes mellitus, ovarian and cervical cancer, possible colon cancer, and Mnire's disease, as well as psychiatric history of bipolar disorder and polysubstance abuse, presented to the emergency department with bizarre and chaotic behavior, gross disorganization, and positive urine drug screen for benzodiazepines, amphetamines, and THC. She was administered Geodon  and Ativan  in the ED.   Subjective:  Chart reviewed, case discussed in multidisciplinary meeting, patient seen during rounds.  7/19 on follow-up patient is found laying in her bed.  She is alert and oriented.  She is cooperative with exam.  She is linear today on exam still yells inappropriately at times.  She did not require PRNs for agitation yesterday.  She is unable to discuss why she is in the hospital but is aware of the location self and date.  She continues to appear internally preoccupied at times but there has been a significant reduction and she is able to have a linear conversation now.  She denies depressive symptoms.  She denies thoughts of harming herself or others.  She denies hallucinations of all modalities but again she appears internally preoccupied at times.  Discussed medications.  Discussed goals for discharge.  Patient wishes to be discharged to home and eating and discussed that we will work to coordinate this with social work.  Discussed possible ACT team and discharge.Encouraged group participation. Encouraged daily hygiene practices.   7/18: On follow-up today patient is found laying in her bed.  She is alert and oriented to self situation and location.  She is cooperative with the assessment today.  She is more linear on exam today and has not had frequent behavioral outburst.  On previous days she had been observed walking around the unit yelling and acted impulsively  however today she is calm.  Discussed medications discussed need for lab work in the coming days.  Patient denies suicidal and homicidal thoughts.  There is still some disorganization and I still have impaired judgment they are progressing.  Will continue current medications.  Valproic  acid level scheduled for 7/20.  Patient continues to require inpatient psychiatric hospitalization for medication management and stabilization due to severely impaired judgment.  7/17: On follow-up patient presents with ongoing psychosis.  With agitation and aggression she has not been physically aggressive towards individuals but she is easily agitated and impulsive.  She is observed responding to internal stimuli.  She requires frequent redirection and is not easily redirectable.  She appears to be tolerating medications well without adverse effects.  Staff notes she is sleeping.  7/16: Patient continues with overt psychosis.  They require frequent redirection and are not easily redirectable they have required multiple PRNs for agitation.  They continue to fixate on their love for black and brown people they are also fixated on moving to [facility] .  They do not meaningfully participate in exam.  7/15: Patient continues to be overtly psychotic they are disorganized on exam continue to make frequent references to their brown family and loving brown people they also today are making references to how they voted for Trump and that they are a proud American. They at times discussed how they want to return to [facility]  and then talk about how they are a resident of [facility] [place]  they do not meaningfully participate in the exam.  7/14: On exam patient is disorganized and requires frequent redirection.  They are frequently  mentioning the brown family and how they have brown people's backs.  They discussed how all of their children are biracial.  They are not irritable today.  Diagnostic clarity remains uncertain  however if secondary to psychoactive substance would expect to resolve.  However given severely impaired judgment patient is danger to himself and others and requires continued psychiatric hospitalization will titrate up on Zyprexa   Sleep: UTA  Appetite:  Fair  Past Psychiatric History: see h&P Family History:  Family History  Problem Relation Age of Onset   Thyroid  disease Mother    Colon cancer Father    Breast cancer Maternal Aunt    Colon cancer Brother    Colon cancer Other        paternal and maternal grandfather   Meniere's disease Other    Anesthesia problems Neg Hx    Hypotension Neg Hx    Malignant hyperthermia Neg Hx    Pseudochol deficiency Neg Hx    Social History:  Social History   Substance and Sexual Activity  Alcohol Use No   Comment: not since June 2012     Social History   Substance and Sexual Activity  Drug Use No   Comment: patient denies any-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    Social History   Socioeconomic History   Marital status: Legally Separated    Spouse name: Not on file   Number of children: Not on file   Years of education: Not on file   Highest education level: Not on file  Occupational History   Not on file  Tobacco Use   Smoking status: Every Day    Current packs/day: 0.00    Average packs/day: 0.5 packs/day for 37.3 years (18.7 ttl pk-yrs)    Types: Cigarettes    Start date: 07/07/1981    Last attempt to quit: 11/05/2018    Years since quitting: 5.2   Smokeless tobacco: Never  Vaping Use   Vaping status: Never Used  Substance and Sexual Activity   Alcohol use: No    Comment: not since June 2012   Drug use: No    Comment: patient denies any-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   Sexual activity: Never    Birth control/protection: Surgical  Other Topics Concern   Not on file  Social History Narrative   Not on file   Social Drivers of Health   Financial Resource Strain: Not on file  Food Insecurity: Food  Insecurity Present (01/15/2024)   Hunger Vital Sign    Worried About Running Out of Food in the Last Year: Often true    Ran Out of Food in the Last Year: Often true  Transportation Needs: Unmet Transportation Needs (01/15/2024)   PRAPARE - Administrator, Civil Service (Medical): Yes    Lack of Transportation (Non-Medical): Yes  Physical Activity: Not on file  Stress: Not on file  Social Connections: Unknown (10/14/2021)   Received from [facility]   Social Connections    Do your friends and family support you?: Not on file    What agencies support you?: Not on file   Past Medical History:  Past Medical History:  Diagnosis Date   Arthritis    Asthma    Bipolar 1 disorder (HCC)    Diverticulosis    Dysrhythmia    sts I have heart palpitations   Fibromyalgia    H/O hiatal hernia    4   Headache(784.0)  High blood pressure    Meniere's disease    S/P colonoscopy    Dr. Golda 2010: few small diverticula at sigmoid. otherwise normal.    Shortness of breath     Past Surgical History:  Procedure Laterality Date   ABDOMINAL HYSTERECTOMY     APPENDECTOMY     CESAREAN SECTION     CHOLECYSTECTOMY     COLONOSCOPY N/A 12/25/2023   Procedure: COLONOSCOPY;  Surgeon: Eartha Angelia Sieving, MD;  Location: [facility];  Service: Gastroenterology;  Laterality: N/A;  11:30am, asa 1   exploratory laparoscopy     FOOT SURGERY     HEMORRHOID SURGERY     LAPAROSCOPIC APPENDECTOMY  02/19/2011   Procedure: APPENDECTOMY LAPAROSCOPIC;  Surgeon: Oneil DELENA Budge;  Location: [facility];  Service: General;  Laterality: N/A;   LAPAROSCOPY  02/19/2011   Procedure: LAPAROSCOPY DIAGNOSTIC;  Surgeon: Oneil DELENA Budge;  Location: [facility];  Service: General;  Laterality: N/A;   left ovarian removal     multiple hernia repairs     Right ovarian removal     TONSILLECTOMY     TONSILLECTOMY AND ADENOIDECTOMY     tubes in ears     UMBILICAL HERNIA REPAIR  Dec 2011   Dr. Budge    wisdom teeth removal      Current Medications: Current Facility-Administered Medications  Medication Dose Route Frequency Provider Last Rate Last Admin   acetaminophen  (TYLENOL ) tablet 650 mg  650 mg Oral Q6H PRN Bobbitt, Shalon E, NP   650 mg at 01/23/24 0833   alum & mag hydroxide-simeth (MAALOX/MYLANTA) 200-200-20 MG/5ML suspension 30 mL  30 mL Oral Q4H PRN Bobbitt, Shalon E, NP   30 mL at 01/22/24 2333   benzocaine  (ORAJEL) 10 % mucosal gel   Mouth/Throat TID PRN Trudy Carwin, NP   Given at 01/23/24 0243   cloNIDine  (CATAPRES ) tablet 0.1 mg  0.1 mg Oral Daily Yohanna Tow E, PA-C   0.1 mg at 01/23/24 9165   haloperidol  (HALDOL ) tablet 5 mg  5 mg Oral TID PRN Bobbitt, Shalon E, NP   5 mg at 01/21/24 2155   And   diphenhydrAMINE  (BENADRYL ) capsule 50 mg  50 mg Oral TID PRN Bobbitt, Shalon E, NP   50 mg at 01/21/24 2155   haloperidol  lactate (HALDOL ) injection 5 mg  5 mg Intramuscular TID PRN Bobbitt, Shalon E, NP       And   diphenhydrAMINE  (BENADRYL ) injection 50 mg  50 mg Intramuscular TID PRN Bobbitt, Shalon E, NP       And   LORazepam  (ATIVAN ) injection 2 mg  2 mg Intramuscular TID PRN Bobbitt, Shalon E, NP       haloperidol  lactate (HALDOL ) injection 10 mg  10 mg Intramuscular TID PRN Bobbitt, Shalon E, NP   10 mg at 01/20/24 1413   And   diphenhydrAMINE  (BENADRYL ) injection 50 mg  50 mg Intramuscular TID PRN Bobbitt, Shalon E, NP   50 mg at 01/20/24 1414   And   LORazepam  (ATIVAN ) injection 2 mg  2 mg Intramuscular TID PRN Bobbitt, Shalon E, NP   2 mg at 01/21/24 2304   divalproex  (DEPAKOTE ) DR tablet 500 mg  500 mg Oral Q12H Amory Zbikowski E, PA-C   500 mg at 01/23/24 9166   fluPHENAZine  (PROLIXIN ) tablet 5 mg  5 mg Oral BID Madex Seals E, PA-C   5 mg at 01/23/24 9166   hydrOXYzine  (ATARAX ) tablet 25 mg  25 mg Oral TID PRN  Bobbitt, Shalon E, NP   25 mg at 01/23/24 9165   magnesium  hydroxide (MILK OF MAGNESIA) suspension 30 mL  30 mL Oral Daily PRN Bobbitt,  Shalon E, NP       nicotine  (NICODERM CQ  - dosed in mg/24 hours) patch 14 mg  14 mg Transdermal Daily Jadapalle, Sree, MD   14 mg at 01/23/24 9165   nicotine  polacrilex (NICORETTE ) gum 2 mg  2 mg Oral PRN Jadapalle, Sree, MD   2 mg at 01/22/24 1924   OLANZapine  (ZYPREXA ) tablet 10 mg  10 mg Oral BID Leanord Thibeau E, PA-C   10 mg at 01/23/24 9165   ondansetron  (ZOFRAN -ODT) disintegrating tablet 4 mg  4 mg Oral Q8H PRN Cleotilde Hoy HERO, NP   4 mg at 01/23/24 1414   phenylephrine -shark liver oil-mineral oil-petrolatum  (PREPARATION H) rectal ointment 1 Application  1 Application Rectal BID PRN Torsha Lemus E, PA-C   1 Application at 01/19/24 2257   traZODone  (DESYREL ) tablet 50 mg  50 mg Oral QHS PRN Bobbitt, Shalon E, NP   50 mg at 01/22/24 2122    Lab Results:  No results found for this or any previous visit (from the past 48 hours).   Blood Alcohol level:  Lab Results  Component Value Date   [facility] <15 01/14/2024   ETH <10 05/07/2019    Metabolic Disorder Labs: Lab Results  Component Value Date   HGBA1C 5.1 01/18/2024   MPG 99.67 01/18/2024   MPG 105.41 05/11/2019   Lab Results  Component Value Date   PROLACTIN 24.1 (H) 05/11/2019   Lab Results  Component Value Date   CHOL 154 01/18/2024   TRIG 180 (H) 01/18/2024   HDL 49 01/18/2024   CHOLHDL 3.1 01/18/2024   VLDL 36 01/18/2024   LDLCALC 69 01/18/2024   LDLCALC 82 05/11/2019    Physical Findings: AIMS:  , ,  ,  ,    CIWA:    COWS:      Psychiatric Specialty Exam:  Presentation  General Appearance:  Disheveled  Eye Contact: Fair  Speech: Normal Rate; Clear and Coherent  Speech Volume: Decreased    Mood and Affect  Mood: Anxious; Depressed  Affect: Flat   Thought Process  Thought Processes: Disorganized  Descriptions of Associations:Circumstantial  Orientation:Full (Time, Place and Person)  Thought Content:Obsessions; Rumination  Hallucinations:No data recorded  Ideas of  Reference:Paranoia; Percusatory  Suicidal Thoughts:No data recorded  Homicidal Thoughts:No data recorded   Sensorium  Memory:No data recorded Judgment: Poor  Insight: Poor   Executive Functions  Concentration: Fair  Attention Span: Poor  Recall: Good  Fund of Knowledge: Good  Language: Good   Psychomotor Activity  Psychomotor Activity: No data recorded  Musculoskeletal: Strength & Muscle Tone: within normal limits Gait & Station: normal Assets  Assets:No data recorded   Physical Exam: Physical Exam Vitals and nursing note reviewed.  HENT:     Head: Atraumatic.  Eyes:     Extraocular Movements: Extraocular movements intact.  Pulmonary:     Effort: Pulmonary effort is normal.  Neurological:     Mental Status: She is alert and oriented to person, place, and time.    Review of Systems  Psychiatric/Behavioral:  Positive for hallucinations.    Blood pressure 122/79, pulse 66, temperature (!) 97.2 F (36.2 C), resp. rate 20, height 5' 6 (1.676 m), weight 77.1 kg, SpO2 97%. Body mass index is 27.44 kg/m.  Diagnosis: Principal Problem:   Psychoactive substance-induced psychosis (HCC) Active Problems:  Bipolar disorder (HCC)   Substance induced mood disorder (HCC)   PLAN: Safety and Monitoring:  -- Voluntary admission to inpatient psychiatric unit for safety, stabilization and treatment  -- Daily contact with patient to assess and evaluate symptoms and progress in treatment  -- Patient's case to be discussed in multi-disciplinary team meeting  -- Observation Level : q15 minute checks  -- Vital signs:  q12 hours  -- Precautions: suicide, elopement, and assault -- Encouraged patient to participate in unit milieu and in scheduled group therapies  2. Psychiatric Diagnoses and Treatment:  Will discontinue clonidine  at this time as we continue to work towards discharge. Pt continues to require hospitalization due to impaired judgement.  Patient with  improvement today, will continue current course Continue Zyprexa  to 10 mg twice daily D/c clonidine  0.1 mg daily for impulsivity and agitation Continue Depakote  500 mg BID has tolerated in the past. Level ordered for 7/20 Continue prolixin  5 mg bid  The risks/benefits/side-effects/alternatives to this medication were discussed in detail with the patient and time was given for questions. The patient consents to medication trial. -- Metabolic profile and EKG monitoring obtained while on an atypical antipsychotic  -- Encouraged patient to participate in unit milieu and in scheduled group therapies     3. Medical Issues Being Addressed:    no acute needs at this time, medically cleared in ED for psychiatric care  4. Discharge Planning:   -- Social work and case management to assist with discharge planning and identification of hospital follow-up needs prior to discharge  -- Estimated LOS: 3-4 days  Donnice FORBES Right, PA-C 01/23/2024, 2:24 PM

## 2024-01-23 NOTE — Plan of Care (Signed)
  Problem: Education: Goal: Knowledge of [facility] Education information/materials will improve Outcome: Progressing   Problem: Education: Goal: Emotional status will improve Outcome: Progressing   Problem: Education: Goal: Mental status will improve Outcome: Progressing   

## 2024-01-23 NOTE — Progress Notes (Signed)
   01/22/24 2000  Psych Admission Type (Psych Patients Only)  Admission Status Voluntary  Psychosocial Assessment  Patient Complaints Anxiety  Eye Contact Watchful  Facial Expression Anxious  Affect Preoccupied  Speech Loud  Interaction Minimal  Motor Activity Slow  Appearance/Hygiene Disheveled;Poor hygiene  Behavior Characteristics Agressive verbally;Agitated  Mood Preoccupied;Irritable  Thought Process  Coherency Disorganized  Content Preoccupation  Delusions None reported or observed  Perception Depersonalization  Hallucination None reported or observed  Judgment Poor  Confusion Mild  Danger to Self  Current suicidal ideation? Denies  Agreement Not to Harm Self Yes  Danger to Others  Danger to Others None reported or observed   Patient appears restless, thoughts are disorganized, speech is tangential, affect is congruent with mood, she was demanding and intrusive, pacing the hall, yelling, loud and obnoxious. Patient is  not receptive to staff, she was offered emotional support and encouragement. Patient was medicated per standing order, 15 minutes safety checks maintained.

## 2024-01-23 NOTE — Group Note (Signed)
 Date:  01/23/2024 Time:  1:32 AM  Group Topic/Focus:  Overcoming Stress:   The focus of this group is to define stress and help patients assess their triggers. Self Care:   The focus of this group is to help patients understand the importance of self-care in order to improve or restore emotional, physical, spiritual, interpersonal, and financial health.    Participation Level:  Minimal  Participation Quality:  Inattentive  Affect:  Anxious  Cognitive:  Appropriate and Disorganized  Insight: Appropriate  Engagement in Group:  Limited  Modes of Intervention:  Discussion and Support  Additional Comments:  N/A  Allison Bolton 01/23/2024, 1:32 AM

## 2024-01-24 DIAGNOSIS — F19959 Other psychoactive substance use, unspecified with psychoactive substance-induced psychotic disorder, unspecified: Secondary | ICD-10-CM | POA: Diagnosis not present

## 2024-01-24 LAB — VALPROIC ACID LEVEL: Valproic Acid Lvl: 39 ug/mL — ABNORMAL LOW (ref 50–100)

## 2024-01-24 MED ORDER — DIVALPROEX SODIUM 500 MG PO DR TAB
750.0000 mg | DELAYED_RELEASE_TABLET | Freq: Two times a day (BID) | ORAL | Status: DC
  Administered 2024-01-24 – 2024-02-04 (×22): 750 mg via ORAL
  Filled 2024-01-24 (×22): qty 1

## 2024-01-24 NOTE — Progress Notes (Signed)
   01/24/24 1400  Psych Admission Type (Psych Patients Only)  Admission Status Voluntary  Psychosocial Assessment  Patient Complaints Anxiety  Eye Contact Fair  Facial Expression Anxious;Animated  Affect Anxious;Labile;Irritable;Sad  Speech Loud;Aggressive (intermittently loud and aggressive speech)  Interaction Assertive  Motor Activity Restless  Appearance/Hygiene Disheveled  Behavior Characteristics Anxious;Irritable;Restless;Fidgety;Agitated;Cooperative  Mood Anxious;Labile;Irritable;Pleasant  Aggressive Behavior  Targets Other (Comment) (others)  Type of Behavior Verbal;Threatening;Provoked or triggered  Effect No apparent injury  Thought Process  Coherency Disorganized  Content Hypochondria;Preoccupation  Delusions None reported or observed  Perception UTA  Hallucination None reported or observed  Judgment Poor  Confusion Mild  Danger to Self  Current suicidal ideation? Denies  Danger to Others  Danger to Others None reported or observed

## 2024-01-24 NOTE — Plan of Care (Signed)
  Problem: Education: Goal: Emotional status will improve Outcome: Progressing Goal: Mental status will improve Outcome: Progressing Goal: Verbalization of understanding the information provided will improve Outcome: Progressing   Problem: Activity: Goal: Interest or engagement in activities will improve Outcome: Progressing   Problem: Coping: Goal: Ability to verbalize frustrations and anger appropriately will improve Outcome: Progressing Goal: Ability to demonstrate self-control will improve Outcome: Progressing   Problem: Health Behavior/Discharge Planning: Goal: Compliance with treatment plan for underlying cause of condition will improve Outcome: Progressing   Problem: Physical Regulation: Goal: Ability to maintain clinical measurements within normal limits will improve Outcome: Progressing   Problem: Safety: Goal: Periods of time without injury will increase Outcome: Progressing

## 2024-01-24 NOTE — Group Note (Signed)
 Date:  01/24/2024 Time:  11:07 AM  Group Topic/Focus:  Self Esteem Action Plan:   The focus of this group is to help patients create a plan to continue to build self-esteem after discharge.    Participation Level:  Active  Participation Quality:  Appropriate  Affect:  Appropriate  Cognitive:  Appropriate  Insight: Appropriate  Engagement in Group:  Engaged  Modes of Intervention:  Activity  Additional Comments:    Camellia HERO Indy Prestwood 01/24/2024, 11:07 AM

## 2024-01-24 NOTE — Group Note (Signed)
 Date:  01/24/2024 Time:  11:42 PM  Group Topic/Focus:  Building Self Esteem:   The Focus of this group is helping patients become aware of the effects of self-esteem on their lives, the things they and others do that enhance or undermine their self-esteem, seeing the relationship between their level of self-esteem and the choices they make and learning ways to enhance self-esteem. Goals Group:   The focus of this group is to help patients establish daily goals to achieve during treatment and discuss how the patient can incorporate goal setting into their daily lives to aide in recovery. Self Care:   The focus of this group is to help patients understand the importance of self-care in order to improve or restore emotional, physical, spiritual, interpersonal, and financial health.    Participation Level:  Minimal  Participation Quality:  Appropriate  Affect:  Appropriate  Cognitive:  Appropriate and Disorganized  Insight: Appropriate  Engagement in Group:  Limited  Modes of Intervention:  Discussion and Support  Additional Comments:  N/A  Butler LITTIE Gelineau 01/24/2024, 11:42 PM

## 2024-01-24 NOTE — Progress Notes (Signed)
 [facility] MD Progress Note  01/24/2024 11:33 AM TY OSHIMA  MRN:  [id number]  53 year old female with medical history of asthma, fibromyalgia, diabetes mellitus, ovarian and cervical cancer, possible colon cancer, and Mnire's disease, as well as psychiatric history of bipolar disorder and polysubstance abuse, presented to the emergency department with bizarre and chaotic behavior, gross disorganization, and positive urine drug screen for benzodiazepines, amphetamines, and THC. She was administered Geodon  and Ativan  in the ED.   Subjective:  Chart reviewed, case discussed in multidisciplinary meeting, patient seen during rounds.  7/20: On exam patient is calm and cooperative she discusses wanting to go stay with her daughter in [place], [place] at discharge. She reports she slept well last night. She is still quite emotionally labile when she did not get Ensure she started screaming and yelling at staff.  She then walked around the hallways screaming.  She continues to appear internally preoccupied at times.  She continues to deny depressive symptoms.  She denies thoughts of harming self or others.  Discussed medications will increase valproic  acid.  Discussed goals for discharge.  Encouraged regular group participation.  Encourage daily hygiene practices.  7/19 on follow-up patient is found laying in her bed.  She is alert and oriented.  She is cooperative with exam.  She is linear today on exam still yells inappropriately at times.  She did not require PRNs for agitation yesterday.  She is unable to discuss why she is in the hospital but is aware of the location self and date.  She continues to appear internally preoccupied at times but there has been a significant reduction and she is able to have a linear conversation now.  She denies depressive symptoms.  She denies thoughts of harming herself or others.  She denies hallucinations of all modalities but again she appears internally preoccupied at times.   Discussed medications.  Discussed goals for discharge.  Patient wishes to be discharged to home and eating and discussed that we will work to coordinate this with social work.  Discussed possible ACT team and discharge.Encouraged group participation. Encouraged daily hygiene practices.   7/18: On follow-up today patient is found laying in her bed.  She is alert and oriented to self situation and location.  She is cooperative with the assessment today.  She is more linear on exam today and has not had frequent behavioral outburst.  On previous days she had been observed walking around the unit yelling and acted impulsively however today she is calm.  Discussed medications discussed need for lab work in the coming days.  Patient denies suicidal and homicidal thoughts.  There is still some disorganization and I still have impaired judgment they are progressing.  Will continue current medications.  Valproic  acid level scheduled for 7/20.  Patient continues to require inpatient psychiatric hospitalization for medication management and stabilization due to severely impaired judgment.  7/17: On follow-up patient presents with ongoing psychosis.  With agitation and aggression she has not been physically aggressive towards individuals but she is easily agitated and impulsive.  She is observed responding to internal stimuli.  She requires frequent redirection and is not easily redirectable.  She appears to be tolerating medications well without adverse effects.  Staff notes she is sleeping.  7/16: Patient continues with overt psychosis.  They require frequent redirection and are not easily redirectable they have required multiple PRNs for agitation.  They continue to fixate on their love for black and brown people they are also fixated on moving  to [facility] [place] .  They do not meaningfully participate in exam.  7/15: Patient continues to be overtly psychotic they are disorganized on exam continue to make frequent  references to their brown family and loving brown people they also today are making references to how they voted for Trump and that they are a proud American. They at times discussed how they want to return to [facility] [place]  and then talk about how they are a resident of [place]  they do not meaningfully participate in the exam.  7/14: On exam patient is disorganized and requires frequent redirection.  They are frequently mentioning the brown family and how they have brown people's backs.  They discussed how all of their children are biracial.  They are not irritable today.  Diagnostic clarity remains uncertain however if secondary to psychoactive substance would expect to resolve.  However given severely impaired judgment patient is danger to himself and others and requires continued psychiatric hospitalization will titrate up on Zyprexa   Sleep: UTA  Appetite:  Fair  Past Psychiatric History: see h&P Family History:  Family History  Problem Relation Age of Onset   Thyroid  disease Mother    Colon cancer Father    Breast cancer Maternal Aunt    Colon cancer Brother    Colon cancer Other        paternal and maternal grandfather   Meniere's disease Other    Anesthesia problems Neg Hx    Hypotension Neg Hx    Malignant hyperthermia Neg Hx    Pseudochol deficiency Neg Hx    Social History:  Social History   Substance and Sexual Activity  Alcohol Use No   Comment: not since June 2012     Social History   Substance and Sexual Activity  Drug Use No   Comment: patient denies any-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    Social History   Socioeconomic History   Marital status: Legally Separated    Spouse name: Not on file   Number of children: Not on file   Years of education: Not on file   Highest education level: Not on file  Occupational History   Not on file  Tobacco Use   Smoking status: Every Day    Current packs/day: 0.00    Average packs/day: 0.5  packs/day for 37.3 years (18.7 ttl pk-yrs)    Types: Cigarettes    Start date: 07/07/1981    Last attempt to quit: 11/05/2018    Years since quitting: 5.2   Smokeless tobacco: Never  Vaping Use   Vaping status: Never Used  Substance and Sexual Activity   Alcohol use: No    Comment: not since June 2012   Drug use: No    Comment: patient denies any-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   Sexual activity: Never    Birth control/protection: Surgical  Other Topics Concern   Not on file  Social History Narrative   Not on file   Social Drivers of Health   Financial Resource Strain: Not on file  Food Insecurity: Food Insecurity Present (01/15/2024)   Hunger Vital Sign    Worried About Running Out of Food in the Last Year: Often true    Ran Out of Food in the Last Year: Often true  Transportation Needs: Unmet Transportation Needs (01/15/2024)   PRAPARE - Administrator, Civil Service (Medical): Yes    Lack of Transportation (Non-Medical): Yes  Physical Activity: Not on  file  Stress: Not on file  Social Connections: Unknown (10/14/2021)   Received from [facility]   Social Connections    Do your friends and family support you?: Not on file    What agencies support you?: Not on file   Past Medical History:  Past Medical History:  Diagnosis Date   Arthritis    Asthma    Bipolar 1 disorder (HCC)    Diverticulosis    Dysrhythmia    sts I have heart palpitations   Fibromyalgia    H/O hiatal hernia    4   Headache(784.0)    High blood pressure    Meniere's disease    S/P colonoscopy    Dr. Golda 2010: few small diverticula at sigmoid. otherwise normal.    Shortness of breath     Past Surgical History:  Procedure Laterality Date   ABDOMINAL HYSTERECTOMY     APPENDECTOMY     CESAREAN SECTION     CHOLECYSTECTOMY     COLONOSCOPY N/A 12/25/2023   Procedure: COLONOSCOPY;  Surgeon: Eartha Angelia Sieving, MD;  Location: [facility];  Service:  Gastroenterology;  Laterality: N/A;  11:30am, asa 1   exploratory laparoscopy     FOOT SURGERY     HEMORRHOID SURGERY     LAPAROSCOPIC APPENDECTOMY  02/19/2011   Procedure: APPENDECTOMY LAPAROSCOPIC;  Surgeon: Oneil DELENA Budge;  Location: [facility] OR[facility];  Service: General;  Laterality: N/A;   LAPAROSCOPY  02/19/2011   Procedure: LAPAROSCOPY DIAGNOSTIC;  Surgeon: Oneil DELENA Budge;  Location: [facility] OR[facility];  Service: General;  Laterality: N/A;   left ovarian removal     multiple hernia repairs     Right ovarian removal     TONSILLECTOMY     TONSILLECTOMY AND ADENOIDECTOMY     tubes in ears     UMBILICAL HERNIA REPAIR  Dec 2011   Dr. Budge   wisdom teeth removal      Current Medications: Current Facility-Administered Medications  Medication Dose Route Frequency Provider Last Rate Last Admin   acetaminophen  (TYLENOL ) tablet 650 mg  650 mg Oral Q6H PRN Bobbitt, Shalon E, NP   650 mg at 01/24/24 0816   alum & mag hydroxide-simeth (MAALOX/MYLANTA) 200-200-20 MG/5ML suspension 30 mL  30 mL Oral Q4H PRN Bobbitt, Shalon E, NP   30 mL at 01/23/24 2223   benzocaine  (ORAJEL) 10 % mucosal gel   Mouth/Throat TID PRN Trudy Carwin, NP   Given at 01/24/24 0819   haloperidol  (HALDOL ) tablet 5 mg  5 mg Oral TID PRN Bobbitt, Shalon E, NP   5 mg at 01/21/24 2155   And   diphenhydrAMINE  (BENADRYL ) capsule 50 mg  50 mg Oral TID PRN Bobbitt, Shalon E, NP   50 mg at 01/21/24 2155   haloperidol  lactate (HALDOL ) injection 5 mg  5 mg Intramuscular TID PRN Bobbitt, Shalon E, NP       And   diphenhydrAMINE  (BENADRYL ) injection 50 mg  50 mg Intramuscular TID PRN Bobbitt, Shalon E, NP       And   LORazepam  (ATIVAN ) injection 2 mg  2 mg Intramuscular TID PRN Bobbitt, Shalon E, NP       haloperidol  lactate (HALDOL ) injection 10 mg  10 mg Intramuscular TID PRN Bobbitt, Shalon E, NP   10 mg at 01/20/24 1413   And   diphenhydrAMINE  (BENADRYL ) injection 50 mg  50 mg Intramuscular TID PRN Bobbitt, Shalon E, NP   50 mg at 01/20/24 1414  And   LORazepam  (ATIVAN ) injection 2 mg  2 mg Intramuscular TID PRN Bobbitt, Shalon E, NP   2 mg at 01/21/24 2304   divalproex  (DEPAKOTE ) DR tablet 500 mg  500 mg Oral Q12H Makinzy Cleere E, PA-C   500 mg at 01/24/24 9183   fluPHENAZine  (PROLIXIN ) tablet 5 mg  5 mg Oral BID Shae Hinnenkamp E, PA-C   5 mg at 01/24/24 9183   hydrOXYzine  (ATARAX ) tablet 25 mg  25 mg Oral TID PRN Bobbitt, Shalon E, NP   25 mg at 01/23/24 9165   magnesium  hydroxide (MILK OF MAGNESIA) suspension 30 mL  30 mL Oral Daily PRN Bobbitt, Shalon E, NP       nicotine  (NICODERM CQ  - dosed in mg/24 hours) patch 14 mg  14 mg Transdermal Daily Jadapalle, Sree, MD   14 mg at 01/24/24 0818   nicotine  polacrilex (NICORETTE ) gum 2 mg  2 mg Oral PRN Jadapalle, Sree, MD   2 mg at 01/24/24 1110   OLANZapine  (ZYPREXA ) tablet 10 mg  10 mg Oral BID Geron Mulford E, PA-C   10 mg at 01/24/24 9183   ondansetron  (ZOFRAN -ODT) disintegrating tablet 4 mg  4 mg Oral Q8H PRN Cleotilde Hoy HERO, NP   4 mg at 01/23/24 2223   phenylephrine -shark liver oil-mineral oil-petrolatum  (PREPARATION H) rectal ointment 1 Application  1 Application Rectal BID PRN Christena Sunderlin E, PA-C   1 Application at 01/19/24 2257   traZODone  (DESYREL ) tablet 50 mg  50 mg Oral QHS PRN Bobbitt, Shalon E, NP   50 mg at 01/23/24 2100    Lab Results:  Results for orders placed or performed during the hospital encounter of 01/15/24 (from the past 48 hours)  Valproic  acid level     Status: Abnormal   Collection Time: 01/24/24  9:25 AM  Result Value Ref Range   Valproic  Acid Lvl 39 (L) 50 - 100 ug/mL    Comment: Performed at [facility], [address] [place], [place] [address]     Blood Alcohol level:  Lab Results  Component Value Date   [facility] <15 01/14/2024   ETH <10 05/07/2019    Metabolic Disorder Labs: Lab Results  Component Value Date   HGBA1C 5.1 01/18/2024   MPG 99.67 01/18/2024   MPG 105.41 05/11/2019   Lab Results   Component Value Date   PROLACTIN 24.1 (H) 05/11/2019   Lab Results  Component Value Date   CHOL 154 01/18/2024   TRIG 180 (H) 01/18/2024   HDL 49 01/18/2024   CHOLHDL 3.1 01/18/2024   VLDL 36 01/18/2024   LDLCALC 69 01/18/2024   LDLCALC 82 05/11/2019    Physical Findings: AIMS:  , ,  ,  ,    CIWA:    COWS:      Psychiatric Specialty Exam:  Presentation  General Appearance:  Disheveled  Eye Contact: Fair  Speech: Normal Rate; Clear and Coherent  Speech Volume: Decreased    Mood and Affect  Mood: Anxious; Depressed  Affect: Flat   Thought Process  Thought Processes: Disorganized  Descriptions of Associations:Circumstantial  Orientation:Full (Time, Place and Person)  Thought Content:Obsessions; Rumination  Hallucinations:No data recorded  Ideas of Reference:Paranoia; Percusatory  Suicidal Thoughts:No data recorded  Homicidal Thoughts:No data recorded   Sensorium  Memory:No data recorded Judgment: Poor  Insight: Poor   Executive Functions  Concentration: Fair  Attention Span: Poor  Recall: Good  Fund of Knowledge: Good  Language: Good   Psychomotor Activity  Psychomotor  Activity: No data recorded  Musculoskeletal: Strength & Muscle Tone: within normal limits Gait & Station: normal Assets  Assets:No data recorded   Physical Exam: Physical Exam Vitals and nursing note reviewed.  HENT:     Head: Atraumatic.  Eyes:     Extraocular Movements: Extraocular movements intact.  Pulmonary:     Effort: Pulmonary effort is normal.  Neurological:     Mental Status: She is alert and oriented to person, place, and time.    Review of Systems  Psychiatric/Behavioral:  Positive for hallucinations.    Blood pressure 122/63, pulse (!) 119, temperature 97.8 F (36.6 C), resp. rate 17, height 5' 6 (1.676 m), weight 77.1 kg, SpO2 97%. Body mass index is 27.44 kg/m.  Diagnosis: Principal Problem:   Psychoactive  substance-induced psychosis (HCC) Active Problems:   Bipolar disorder (HCC)   Substance induced mood disorder (HCC)   PLAN: Safety and Monitoring:  -- Voluntary admission to inpatient psychiatric unit for safety, stabilization and treatment  -- Daily contact with patient to assess and evaluate symptoms and progress in treatment  -- Patient's case to be discussed in multi-disciplinary team meeting  -- Observation Level : q15 minute checks  -- Vital signs:  q12 hours  -- Precautions: suicide, elopement, and assault -- Encouraged patient to participate in unit milieu and in scheduled group therapies  2. Psychiatric Diagnoses and Treatment:  Will increase depakote  given on going lability, level today was 39. Pt continues to require hospitalization due to impaired judgement.  Patient with improvement today, will continue current course Continue Zyprexa  to 10 mg twice daily D/c clonidine  0.1 mg daily for impulsivity and agitation Increase depakote  to 750 mg BID level on 7/20 was 39 Continue prolixin  5 mg bid  The risks/benefits/side-effects/alternatives to this medication were discussed in detail with the patient and time was given for questions. The patient consents to medication trial. -- Metabolic profile and EKG monitoring obtained while on an atypical antipsychotic  -- Encouraged patient to participate in unit milieu and in scheduled group therapies     3. Medical Issues Being Addressed:    no acute needs at this time, medically cleared in ED for psychiatric care  4. Discharge Planning:   -- Social work and case management to assist with discharge planning and identification of hospital follow-up needs prior to discharge  -- Estimated LOS: 3-4 days  Donnice FORBES Right, PA-C 01/24/2024, 11:33 AM

## 2024-01-24 NOTE — Progress Notes (Signed)
 Patient shouting in hallway and yelling at staff. Patient visibly shaking and extremely agitated over not receiving Ensure. She states I have cancer. My bone density is very low and I need Ensure. Camellia, MHT and this nurse were able to begin de-escalating patient; however, patient continues to talk loudly and aggressively. Patient agreeable to take medications; see MAR. Will continue to monitor.

## 2024-01-24 NOTE — Progress Notes (Signed)
 Pt visible in milieu and was anxious and preoccupied on engagement.  She denied AVH, paranoia and delusional thoughts, depression and anxiety..  She was brief in her response.  Pt remains care compliant - Zofran , Trazodone , and Mylanta given.    01/23/24 2100  Psych Admission Type (Psych Patients Only)  Admission Status Voluntary  Psychosocial Assessment  Patient Complaints Anxiety  Eye Contact Fair  Facial Expression Anxious  Affect Appropriate to circumstance  Speech Soft  Interaction Assertive  Motor Activity Restless  Appearance/Hygiene Unremarkable;In scrubs  Behavior Characteristics Appropriate to situation;Anxious  Mood Anxious;Pleasant  Thought Process  Coherency Disorganized  Content Preoccupation  Delusions None reported or observed  Perception UTA  Hallucination None reported or observed  Judgment Limited  Confusion Mild  Danger to Self  Current suicidal ideation? Denies  Agreement Not to Harm Self Yes  Description of Agreement Verbal  Danger to Others  Danger to Others None reported or observed    Problem: Education: Goal: Knowledge of  General Education information/materials will improve Outcome: Progressing Goal: Emotional status will improve Outcome: Progressing Goal: Mental status will improve Outcome: Progressing Goal: Verbalization of understanding the information provided will improve Outcome: Progressing   Problem: Activity: Goal: Interest or engagement in activities will improve Outcome: Progressing Goal: Sleeping patterns will improve Outcome: Progressing   Problem: Coping: Goal: Ability to verbalize frustrations and anger appropriately will improve Outcome: Progressing Goal: Ability to demonstrate self-control will improve Outcome: Progressing

## 2024-01-24 NOTE — Group Note (Signed)
 Date:  01/24/2024 Time:  7:33 PM  Group Topic/Focus:  Activity Group: The focus of the group is to promote activity for the patients and encourage them to go outside and get some fresh air and some exercise.    Participation Level:  Did Not Attend   Allison Bolton 01/24/2024, 7:33 PM

## 2024-01-25 LAB — URINALYSIS, COMPLETE (UACMP) WITH MICROSCOPIC
Bilirubin Urine: NEGATIVE
Glucose, UA: NEGATIVE mg/dL
Hgb urine dipstick: NEGATIVE
Ketones, ur: 5 mg/dL — AB
Nitrite: NEGATIVE
Protein, ur: NEGATIVE mg/dL
Specific Gravity, Urine: 1.018 (ref 1.005–1.030)
pH: 6 (ref 5.0–8.0)

## 2024-01-25 MED ORDER — OLANZAPINE 10 MG PO TABS
10.0000 mg | ORAL_TABLET | Freq: Every day | ORAL | Status: DC
  Administered 2024-01-26: 10 mg via ORAL
  Filled 2024-01-25: qty 1

## 2024-01-25 MED ORDER — FLUPHENAZINE HCL 5 MG PO TABS
5.0000 mg | ORAL_TABLET | Freq: Three times a day (TID) | ORAL | Status: DC
  Administered 2024-01-25 – 2024-02-04 (×29): 5 mg via ORAL
  Filled 2024-01-25 (×31): qty 1

## 2024-01-25 NOTE — Progress Notes (Signed)
 RN went by to check on pt.  Pt was in her room sleeping.  She had meds for anxiety and pain earlier with c/o kidney stones.  Hoy NP made aware.

## 2024-01-25 NOTE — Group Note (Signed)
 Recreation Therapy Group Note   Group Topic:Goal Setting  Group Date: 01/25/2024 Start Time: 1400 End Time: 1500 Facilitators: Celestia Jeoffrey BRAVO, LRT, CTRS Location: Craft Room  Group Description: Product/process development scientist. Patients were given many different magazines, a glue stick, markers, and a piece of cardstock paper. LRT and pts discussed the importance of having goals in life. LRT and pts discussed the difference between short-term and long-term goals, as well as what a SMART goal is. LRT encouraged pts to create a vision board, with images they picked and then cut out with safety scissors from the magazine, for themselves, that capture their short and long-term goals. LRT encouraged pts to show and explain their vision board to the group.   Goal Area(s) Addressed:  Patient will gain knowledge of short vs. long term goals.  Patient will identify goals for themselves. Patient will practice setting SMART goals.   Affect/Mood: N/A   Participation Level: Did not attend    Clinical Observations/Individualized Feedback: Patient did not attend group.   Plan: Continue to engage patient in RT group sessions 2-3x/week.   [address] LRT, CTRS 01/25/2024 5:02 PM

## 2024-01-25 NOTE — Progress Notes (Signed)
 [facility] MD Progress Note  01/25/2024 1:34 PM Allison Bolton  MRN:  [id number]  53 year old female with medical history of asthma, fibromyalgia, diabetes mellitus, ovarian and cervical cancer, possible colon cancer, and Mnire's disease, as well as psychiatric history of bipolar disorder and polysubstance abuse, presented to the emergency department with bizarre and chaotic behavior, gross disorganization, and positive urine drug screen for benzodiazepines, amphetamines, and THC. She was administered Geodon  and Ativan  in the ED.   Subjective:  Chart reviewed, case discussed in multidisciplinary meeting, patient seen during rounds.  07/21: Pt noted to be sleeping in bed wakes for follow up evaluation, she reports she is doing well on on DC would likely be homeless, she reports is sleeping well complaining of some day time sleepiness, review medication and plan to decrease daytime Zyprexa . She complains of right sided back pain and reports has been present for over a week reporting she has kidney stones she is disorganized and seems confused in assessing reporting she has family in [place] and thinks they all have cancer. She is able to deny severe episodes of pain, painful urination or blood in urine. Able to redirect with effort. Pt denies depression however mood noted to be very anxious and helpless somewhat somatic and childlike. She denies SI/HI, denies AVH paranoia and delusions. She reports daily use of THC yet denies other SA.   7/20: On exam patient is calm and cooperative she discusses wanting to go stay with her daughter in [place], [place] at discharge. She reports she slept well last night. She is still quite emotionally labile when she did not get Ensure she started screaming and yelling at staff.  She then walked around the hallways screaming.  She continues to appear internally preoccupied at times.  She continues to deny depressive symptoms.  She denies thoughts of harming self or others.   Discussed medications will increase valproic  acid.  Discussed goals for discharge.  Encouraged regular group participation.  Encourage daily hygiene practices.  7/19 on follow-up patient is found laying in her bed.  She is alert and oriented.  She is cooperative with exam.  She is linear today on exam still yells inappropriately at times.  She did not require PRNs for agitation yesterday.  She is unable to discuss why she is in the hospital but is aware of the location self and date.  She continues to appear internally preoccupied at times but there has been a significant reduction and she is able to have a linear conversation now.  She denies depressive symptoms.  She denies thoughts of harming herself or others.  She denies hallucinations of all modalities but again she appears internally preoccupied at times.  Discussed medications.  Discussed goals for discharge.  Patient wishes to be discharged to home and eating and discussed that we will work to coordinate this with social work.  Discussed possible ACT team and discharge.Encouraged group participation. Encouraged daily hygiene practices.   7/18: On follow-up today patient is found laying in her bed.  She is alert and oriented to self situation and location.  She is cooperative with the assessment today.  She is more linear on exam today and has not had frequent behavioral outburst.  On previous days she had been observed walking around the unit yelling and acted impulsively however today she is calm.  Discussed medications discussed need for lab work in the coming days.  Patient denies suicidal and homicidal thoughts.  There is still some disorganization and I still  have impaired judgment they are progressing.  Will continue current medications.  Valproic  acid level scheduled for 7/20.  Patient continues to require inpatient psychiatric hospitalization for medication management and stabilization due to severely impaired judgment.  7/17: On follow-up  patient presents with ongoing psychosis.  With agitation and aggression she has not been physically aggressive towards individuals but she is easily agitated and impulsive.  She is observed responding to internal stimuli.  She requires frequent redirection and is not easily redirectable.  She appears to be tolerating medications well without adverse effects.  Staff notes she is sleeping.  7/16: Patient continues with overt psychosis.  They require frequent redirection and are not easily redirectable they have required multiple PRNs for agitation.  They continue to fixate on their love for black and brown people they are also fixated on moving to [facility] .  They do not meaningfully participate in exam.  7/15: Patient continues to be overtly psychotic they are disorganized on exam continue to make frequent references to their brown family and loving brown people they also today are making references to how they voted for Trump and that they are a proud American. They at times discussed how they want to return to [facility] [place]  and then talk about how they are a resident of [place]  they do not meaningfully participate in the exam.  7/14: On exam patient is disorganized and requires frequent redirection.  They are frequently mentioning the brown family and how they have brown people's backs.  They discussed how all of their children are biracial.  They are not irritable today.  Diagnostic clarity remains uncertain however if secondary to psychoactive substance would expect to resolve.  However given severely impaired judgment patient is danger to himself and others and requires continued psychiatric hospitalization will titrate up on Zyprexa   Sleep: UTA  Appetite:  Fair  Past Psychiatric History: see h&P Family History:  Family History  Problem Relation Age of Onset   Thyroid  disease Mother    Colon cancer Father    Breast cancer Maternal Aunt    Colon cancer Brother    Colon  cancer Other        paternal and maternal grandfather   Meniere's disease Other    Anesthesia problems Neg Hx    Hypotension Neg Hx    Malignant hyperthermia Neg Hx    Pseudochol deficiency Neg Hx    Social History:  Social History   Substance and Sexual Activity  Alcohol Use No   Comment: not since June 2012     Social History   Substance and Sexual Activity  Drug Use No   Comment: patient denies any-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    Social History   Socioeconomic History   Marital status: Legally Separated    Spouse name: Not on file   Number of children: Not on file   Years of education: Not on file   Highest education level: Not on file  Occupational History   Not on file  Tobacco Use   Smoking status: Every Day    Current packs/day: 0.00    Average packs/day: 0.5 packs/day for 37.3 years (18.7 ttl pk-yrs)    Types: Cigarettes    Start date: 07/07/1981    Last attempt to quit: 11/05/2018    Years since quitting: 5.2   Smokeless tobacco: Never  Vaping Use   Vaping status: Never Used  Substance and Sexual Activity   Alcohol use: No  Comment: not since June 2012   Drug use: No    Comment: patient denies any-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   Sexual activity: Never    Birth control/protection: Surgical  Other Topics Concern   Not on file  Social History Narrative   Not on file   Social Drivers of Health   Financial Resource Strain: Not on file  Food Insecurity: Food Insecurity Present (01/15/2024)   Hunger Vital Sign    Worried About Running Out of Food in the Last Year: Often true    Ran Out of Food in the Last Year: Often true  Transportation Needs: Unmet Transportation Needs (01/15/2024)   PRAPARE - Administrator, Civil Service (Medical): Yes    Lack of Transportation (Non-Medical): Yes  Physical Activity: Not on file  Stress: Not on file  Social Connections: Unknown (10/14/2021)   Received from [facility]   Social Connections    Do your friends and family support you?: Not on file    What agencies support you?: Not on file   Past Medical History:  Past Medical History:  Diagnosis Date   Arthritis    Asthma    Bipolar 1 disorder (HCC)    Diverticulosis    Dysrhythmia    sts I have heart palpitations   Fibromyalgia    H/O hiatal hernia    4   Headache(784.0)    High blood pressure    Meniere's disease    S/P colonoscopy    Dr. Golda 2010: few small diverticula at sigmoid. otherwise normal.    Shortness of breath     Past Surgical History:  Procedure Laterality Date   ABDOMINAL HYSTERECTOMY     APPENDECTOMY     CESAREAN SECTION     CHOLECYSTECTOMY     COLONOSCOPY N/A 12/25/2023   Procedure: COLONOSCOPY;  Surgeon: Eartha Angelia Sieving, MD;  Location: [facility];  Service: Gastroenterology;  Laterality: N/A;  11:30am, asa 1   exploratory laparoscopy     FOOT SURGERY     HEMORRHOID SURGERY     LAPAROSCOPIC APPENDECTOMY  02/19/2011   Procedure: APPENDECTOMY LAPAROSCOPIC;  Surgeon: Oneil DELENA Budge;  Location: [facility];  Service: General;  Laterality: N/A;   LAPAROSCOPY  02/19/2011   Procedure: LAPAROSCOPY DIAGNOSTIC;  Surgeon: Oneil DELENA Budge;  Location: [facility];  Service: General;  Laterality: N/A;   left ovarian removal     multiple hernia repairs     Right ovarian removal     TONSILLECTOMY     TONSILLECTOMY AND ADENOIDECTOMY     tubes in ears     UMBILICAL HERNIA REPAIR  Dec 2011   Dr. Budge   wisdom teeth removal      Current Medications: Current Facility-Administered Medications  Medication Dose Route Frequency Provider Last Rate Last Admin   acetaminophen  (TYLENOL ) tablet 650 mg  650 mg Oral Q6H PRN Bobbitt, Shalon E, NP   650 mg at 01/25/24 1009   alum & mag hydroxide-simeth (MAALOX/MYLANTA) 200-200-20 MG/5ML suspension 30 mL  30 mL Oral Q4H PRN Bobbitt, Shalon E, NP   30 mL at 01/23/24 2223   benzocaine  (ORAJEL) 10 % mucosal gel   Mouth/Throat TID PRN  Trudy Carwin, NP   Given at 01/24/24 0819   haloperidol  (HALDOL ) tablet 5 mg  5 mg Oral TID PRN Bobbitt, Shalon E, NP   5 mg at 01/25/24 1009   And   diphenhydrAMINE  (BENADRYL ) capsule 50 mg  50 mg Oral TID PRN Bobbitt, Shalon E, NP   50 mg at 01/25/24 1009   haloperidol  lactate (HALDOL ) injection 5 mg  5 mg Intramuscular TID PRN Bobbitt, Shalon E, NP   5 mg at 01/24/24 1430   And   diphenhydrAMINE  (BENADRYL ) injection 50 mg  50 mg Intramuscular TID PRN Bobbitt, Shalon E, NP   50 mg at 01/24/24 1431   And   LORazepam  (ATIVAN ) injection 2 mg  2 mg Intramuscular TID PRN Bobbitt, Shalon E, NP   2 mg at 01/24/24 1431   haloperidol  lactate (HALDOL ) injection 10 mg  10 mg Intramuscular TID PRN Bobbitt, Shalon E, NP   10 mg at 01/20/24 1413   And   diphenhydrAMINE  (BENADRYL ) injection 50 mg  50 mg Intramuscular TID PRN Bobbitt, Shalon E, NP   50 mg at 01/20/24 1414   And   LORazepam  (ATIVAN ) injection 2 mg  2 mg Intramuscular TID PRN Bobbitt, Shalon E, NP   2 mg at 01/21/24 2304   divalproex  (DEPAKOTE ) DR tablet 750 mg  750 mg Oral Q12H Millington, Matthew E, PA-C   750 mg at 01/25/24 9177   fluPHENAZine  (PROLIXIN ) tablet 5 mg  5 mg Oral BID Millington, Matthew E, PA-C   5 mg at 01/25/24 9176   hydrOXYzine  (ATARAX ) tablet 25 mg  25 mg Oral TID PRN Bobbitt, Shalon E, NP   25 mg at 01/23/24 0834   magnesium  hydroxide (MILK OF MAGNESIA) suspension 30 mL  30 mL Oral Daily PRN Bobbitt, Shalon E, NP       nicotine  (NICODERM CQ  - dosed in mg/24 hours) patch 14 mg  14 mg Transdermal Daily Jadapalle, Sree, MD   14 mg at 01/25/24 9177   nicotine  polacrilex (NICORETTE ) gum 2 mg  2 mg Oral PRN Jadapalle, Sree, MD   2 mg at 01/24/24 1110   OLANZapine  (ZYPREXA ) tablet 10 mg  10 mg Oral BID Millington, Matthew E, PA-C   10 mg at 01/25/24 9178   ondansetron  (ZOFRAN -ODT) disintegrating tablet 4 mg  4 mg Oral Q8H PRN Cleotilde Hoy HERO, NP   4 mg at 01/24/24 2127   phenylephrine -shark liver oil-mineral oil-petrolatum   (PREPARATION H) rectal ointment 1 Application  1 Application Rectal BID PRN Millington, Matthew E, PA-C   1 Application at 01/19/24 2257   traZODone  (DESYREL ) tablet 50 mg  50 mg Oral QHS PRN Bobbitt, Shalon E, NP   50 mg at 01/24/24 2127    Lab Results:  Results for orders placed or performed during the hospital encounter of 01/15/24 (from the past 48 hours)  Valproic  acid level     Status: Abnormal   Collection Time: 01/24/24  9:25 AM  Result Value Ref Range   Valproic  Acid Lvl 39 (L) 50 - 100 ug/mL    Comment: Performed at [facility], [address] [place], [place] [address]     Blood Alcohol level:  Lab Results  Component Value Date   [facility] <15 01/14/2024   ETH <10 05/07/2019    Metabolic Disorder Labs: Lab Results  Component Value Date   HGBA1C 5.1 01/18/2024   MPG 99.67 01/18/2024   MPG 105.41 05/11/2019   Lab Results  Component Value Date   PROLACTIN 24.1 (H) 05/11/2019   Lab Results  Component Value Date   CHOL 154 01/18/2024   TRIG 180 (H) 01/18/2024   HDL 49 01/18/2024   CHOLHDL 3.1 01/18/2024   VLDL 36 01/18/2024   LDLCALC 69 01/18/2024  LDLCALC 82 05/11/2019    Physical Findings: AIMS:  , ,  ,  ,    CIWA:    COWS:      Psychiatric Specialty Exam:  Presentation  General Appearance:  Casual; Fairly Groomed  Eye Contact: Fleeting  Speech: Clear and Coherent  Speech Volume: Decreased    Mood and Affect  Mood: Hopeless; Dysphoric  Affect: Congruent   Thought Process  Thought Processes: Disorganized; Other (comment) (able to follow conversation with redirection)  Descriptions of Associations:Tangential  Orientation:Full (Time, Place and Person)  Thought Content:Rumination; Perseveration  Hallucinations:Hallucinations: None   Ideas of Reference:None  Suicidal Thoughts:Suicidal Thoughts: No   Homicidal Thoughts:Homicidal Thoughts: No    Sensorium  Memory:Immediate Fair; Remote  Fair  Judgment: Fair  Insight: Poor   Executive Functions  Concentration: Fair  Attention Span: Fair  Recall: Good  Fund of Knowledge: Good  Language: Good   Psychomotor Activity  Psychomotor Activity: Psychomotor Activity: Normal   Musculoskeletal: Strength & Muscle Tone: within normal limits Gait & Station: normal Assets  Assets:No data recorded   Physical Exam: Physical Exam Vitals and nursing note reviewed.  HENT:     Head: Atraumatic.  Eyes:     Extraocular Movements: Extraocular movements intact.  Pulmonary:     Effort: Pulmonary effort is normal.  Neurological:     Mental Status: She is alert and oriented to person, place, and time.    Review of Systems  Psychiatric/Behavioral:  Positive for hallucinations.    Blood pressure 112/71, pulse 70, temperature (!) 97.4 F (36.3 C), resp. rate 18, height 5' 6 (1.676 m), weight 77.1 kg, SpO2 97%. Body mass index is 27.44 kg/m.  Diagnosis: Principal Problem:   Psychoactive substance-induced psychosis (HCC) Active Problems:   Bipolar disorder (HCC)   Substance induced mood disorder (HCC)  Continues to present with disorganized thought content however there is noted improvement from admission on that she can be redirected to topic. No longer irritable and remains cooperative. Given duration of symptoms no longer diagnostically considering this a purely substance induced event. Prolixin  has been added related to continued symptoms with plan to wean Zyprexa  in favor of typical antipsychotic in hopes of improved symptom control. UA ordered related to concern for kidney stone however have discussed in team and there have been no episode of acute pain and pt is often noted to be sleeping despite recent complaints. Will continue to evaluate.      PLAN: Safety and Monitoring:  -- Voluntary admission to inpatient psychiatric unit for safety, stabilization and treatment  -- Daily contact with patient to  assess and evaluate symptoms and progress in treatment  -- Patient's case to be discussed in multi-disciplinary team meeting  -- Observation Level : q15 minute checks  -- Vital signs:  q12 hours  -- Precautions: suicide, elopement, and assault -- Encouraged patient to participate in unit milieu and in scheduled group therapies  2. Psychiatric Diagnoses and Treatment:   Polysubstance Use Disorder (benzodiazepines, amphetamines, cannabis) Rule out Bipolar I Disorder Rule out Schizophrenia Spectrum or Schizoaffective Disorder   Admit to inpatient setting for continued monitoring and stabilization Evaluate for capacity and diagnostic clarification as organization improves  It is the clinical judgment of this provider that in her current disorganized and unpredictable mental state, the patient remains a danger to herself or others without inpatient psychiatric monitoring. She lacks insight and judgment and is unable to provide reliable self-report.   Patient showing some improvement, will continue current course with the  following adjustments: Decrease Zyprexa  to 10 mg po HS D/c clonidine  0.1 mg daily for impulsivity and agitation Increase depakote  to 750 mg BID level on 7/20 was 39, repeat VPA and LFTs 01/29/24 at 0500 Increase  prolixin  5 mg tid  The risks/benefits/side-effects/alternatives to this medication were discussed in detail with the patient and time was given for questions. The patient consents to medication trial. -- Metabolic profile and EKG monitoring obtained while on an atypical antipsychotic  -- Encouraged patient to participate in unit milieu and in scheduled group therapies     3. Medical Issues Being Addressed: UA ordered for reported kidney pain   no acute needs at this time, medically cleared in ED for psychiatric care  4. Discharge Planning:   -- Social work and case management to assist with discharge planning and identification of hospital follow-up needs prior to  discharge  -- Estimated LOS: 3-4 days  Hoy CHRISTELLA Pinal, NP 01/25/2024, 1:34 PM

## 2024-01-25 NOTE — Group Note (Signed)
 [facility] LCSW Group Therapy Note    Group Date: 01/25/2024 Start Time: 1300 End Time: 1400  Type of Therapy and Topic:  Group Therapy:  Overcoming Obstacles  Participation Level:  [facility] PARTICIPATION LEVEL: None  Mood:  Description of Group:   In this group patients will be encouraged to explore what they see as obstacles to their own wellness and recovery. They will be guided to discuss their thoughts, feelings, and behaviors related to these obstacles. The group will process together ways to cope with barriers, with attention given to specific choices patients can make. Each patient will be challenged to identify changes they are motivated to make in order to overcome their obstacles. This group will be process-oriented, with patients participating in exploration of their own experiences as well as giving and receiving support and challenge from other group members.  Therapeutic Goals: 1. Patient will identify personal and current obstacles as they relate to admission. 2. Patient will identify barriers that currently interfere with their wellness or overcoming obstacles.  3. Patient will identify feelings, thought process and behaviors related to these barriers. 4. Patient will identify two changes they are willing to make to overcome these obstacles:    Summary of Patient Progress Patient attended group, however, did not participate in group discussion.    Therapeutic Modalities:   Cognitive Behavioral Therapy Solution Focused Therapy Motivational Interviewing Relapse Prevention Therapy   Sherryle JINNY Margo, LCSW

## 2024-01-25 NOTE — Plan of Care (Signed)

## 2024-01-25 NOTE — Group Note (Signed)
 Date:  01/25/2024 Time:  11:26 AM  Group Topic/Focus:  Goals Group:   The focus of this group is to help patients establish daily goals to achieve during treatment and discuss how the patient can incorporate goal setting into their daily lives to aide in recovery.  Participation Level:  Minimal  Participation Quality:  Appropriate  Affect:  Appropriate  Cognitive:  Disorganized  Insight: Lacking  Engagement in Group:  Lacking  Modes of Intervention:    Additional Comments:    Courtne Lighty A Edwar Coe 01/25/2024, 11:26 AM

## 2024-01-25 NOTE — Progress Notes (Signed)
 Pt presents disorganized and preoccupied. Denies SI/HI/AVH. Pt is redirectable and was observed by this Clinical research associate interacting appropriately with staff and peers on the unit. Pt given education, support, and encouragement to be active in her treatment plan. Pt being monitored Q 15 minutes for safety per unit protocol, remains safe on the unit

## 2024-01-25 NOTE — Progress Notes (Signed)
   01/25/24 0823  Psych Admission Type (Psych Patients Only)  Admission Status Voluntary  Psychosocial Assessment  Patient Complaints Anxiety  Eye Contact Fair;Darting  Facial Expression Anxious;Animated  Affect Anxious  Speech Soft  Interaction Assertive  Motor Activity Other (Comment) (shaking right hand rapidly)  Appearance/Hygiene Disheveled  Behavior Characteristics Restless  Mood Anxious  Aggressive Behavior  Effect No apparent injury  Thought Process  Coherency WDL  Content Preoccupation  Delusions None reported or observed  Perception UTA  Hallucination None reported or observed  Judgment Poor  Confusion WDL  Danger to Self  Current suicidal ideation? Denies  Agreement Not to Harm Self Yes  Description of Agreement verbal  Danger to Others  Danger to Others None reported or observed

## 2024-01-25 NOTE — Progress Notes (Signed)
 Pt in bed on engagement and later OOB, seen in dayroom engaging and interacting with selective peers.  Pt states, "I am good.  I am hungry and ready to get some more food."  She denied AVH, SI/HI, depression and anxiety.  She took all scheduled HS medications; Zofran  and Trazodone  also given.    01/25/24 0300  Psych Admission Type (Psych Patients Only)  Admission Status Voluntary  Psychosocial Assessment  Patient Complaints Anxiety  Eye Contact Fair  Facial Expression Anxious;Animated  Affect Appropriate to circumstance;Anxious  Speech Soft  Interaction Assertive  Motor Activity Slow  Appearance/Hygiene Disheveled  Behavior Characteristics Appropriate to situation;Cooperative;Anxious  Mood Anxious  Aggressive Behavior  Effect No apparent injury  Thought Process  Coherency WDL  Content Preoccupation  Delusions None reported or observed  Perception UTA  Hallucination None reported or observed  Judgment Limited  Confusion Mild  Danger to Self  Current suicidal ideation? Denies  Agreement Not to Harm Self Yes  Description of Agreement Verbal  Danger to Others  Danger to Others None reported or observed    Problem: Education: Goal: Knowledge of [facility] Education information/materials will improve Outcome: Progressing Goal: Emotional status will improve Outcome: Progressing Goal: Mental status will improve Outcome: Progressing Goal: Verbalization of understanding the information provided will improve Outcome: Progressing   Problem: Activity: Goal: Interest or engagement in activities will improve Outcome: Progressing Goal: Sleeping patterns will improve Outcome: Progressing   Problem: Coping: Goal: Ability to verbalize frustrations and anger appropriately will improve Outcome: Progressing Goal: Ability to demonstrate self-control will improve Outcome: Progressing

## 2024-01-26 ENCOUNTER — Encounter (HOSPITAL_COMMUNITY): Admitting: Physical Therapy

## 2024-01-26 NOTE — Group Note (Signed)
 Date:  01/26/2024 Time:  8:43 PM  Group Topic/Focus:  Wrap-Up Group:   The focus of this group is to help patients review their daily goal of treatment and discuss progress on daily workbooks. We discussed unit rules and expectations along with staff identification such as who the techs are and who the nurses are for the night.    Participation Level:  Active  Participation Quality:  Appropriate  Affect:  Appropriate  Cognitive:  Alert  Insight: Appropriate  Engagement in Group:  Engaged  Modes of Intervention:  Discussion  Additional Comments:    Allison Bolton 01/26/2024, 8:43 PM

## 2024-01-26 NOTE — Group Note (Signed)
 [facility] LCSW Group Therapy Note   Group Date: 01/26/2024 Start Time: 1300 End Time: 1400  Type of Therapy/Topic:  Group Therapy:  Feelings about Diagnosis  Participation Level:  Did Not Attend    Description of Group:    This group will allow patients to explore their thoughts and feelings about diagnoses they have received. Patients will be guided to explore their level of understanding and acceptance of these diagnoses. Facilitator will encourage patients to process their thoughts and feelings about the reactions of others to their diagnosis, and will guide patients in identifying ways to discuss their diagnosis with significant others in their lives. This group will be process-oriented, with patients participating in exploration of their own experiences as well as giving and receiving support and challenge from other group members.   Therapeutic Goals: 1. Patient will demonstrate understanding of diagnosis as evidence by identifying two or more symptoms of the disorder:  2. Patient will be able to express two feelings regarding the diagnosis 3. Patient will demonstrate ability to communicate their needs through discussion and/or role plays  Summary of Patient Progress: X   Therapeutic Modalities:   Cognitive Behavioral Therapy Brief Therapy Feelings Identification    [name], LCSW

## 2024-01-26 NOTE — Group Note (Signed)
 Date:  01/26/2024 Time:  5:28 AM  Group Topic/Focus:  Making Healthy Choices:   The focus of this group is to help patients identify negative/unhealthy choices they were using prior to admission and identify positive/healthier coping strategies to replace them upon discharge. Managing Feelings:   The focus of this group is to identify what feelings patients have difficulty handling and develop a plan to handle them in a healthier way upon discharge. Self Care:   The focus of this group is to help patients understand the importance of self-care in order to improve or restore emotional, physical, spiritual, interpersonal, and financial health.    Participation Level:  Minimal  Participation Quality:  Inattentive  Affect:  Anxious  Cognitive:  Disorganized  Insight: Lacking  Engagement in Group:  Limited  Modes of Intervention:  Discussion and Support  Additional Comments:  N/A  Allison Bolton 01/26/2024, 5:28 AM

## 2024-01-26 NOTE — Group Note (Signed)
 Recreation Therapy Group Note   Group Topic:Healthy Support Systems  Group Date: 01/26/2024 Start Time: 1000 End Time: 1050 Facilitators: Celestia Jeoffrey BRAVO, LRT, CTRS Location: Craft Room  Group Description: Straw Bridge. Individually, patients were given 10 plastic drinking straws and an equal length of masking tape. Using the materials provided, patients were instructed to build a free-standing bridge-like structure to suspend an everyday item (ex: puzzle box) off the floor or table surface. All materials were required to be used in Secondary school teacher. LRT facilitated post-activity discussion reviewing how we, humans, are like the structure we built; when things get too heavy in our life and we do not have adequate supports/coping skills, then we will fall just like the straw-built structure will. LRT focused on how having a "base" or structure on the bottom was necessary for the object to stand, meaning we must be secure and stable first before building on ourselves or others. Patients were encouraged to name 2 healthy supports in their life and reflect on how the skills used in this activity can be generalized to daily life post discharge.  Goal Area(s) Addressed:  Patient will identify two healthy support systems in their life. Patient will work on Product manager. Patient will verbalize the importance of having a strong and steady "base".  Patient will follow multi-step directions. Patients will engage in creativity and use all provided materials.   Affect/Mood: N/A   Participation Level: Did not attend    Clinical Observations/Individualized Feedback: Patient did not attend group.   Plan: Continue to engage patient in RT group sessions 2-3x/week.   [address], LRT, CTRS 01/26/2024 1:14 PM

## 2024-01-26 NOTE — Group Note (Signed)
 Date:  01/26/2024 Time:  6:21 PM  Group Topic/Focus:  Making Healthy Choices:   The focus of this group is to help patients identify negative/unhealthy choices they were using prior to admission and identify positive/healthier coping strategies to replace them upon discharge.    Participation Level:  Active  Participation Quality:  Appropriate  Affect:  Appropriate  Cognitive:  Appropriate  Insight: Appropriate  Engagement in Group:  Engaged  Modes of Intervention:  Activity  Additional Comments:    Allison Bolton Allison Bolton 01/26/2024, 6:21 PM

## 2024-01-26 NOTE — Group Note (Signed)
 Date:  01/26/2024 Time:  7:02 PM  Group Topic/Focus:  Activity Group: The focus of the group is to promote activity for the patients and to encourage them to go outside to the courtyard and get some fresh air and some exercise.    Participation Level:  Active  Participation Quality:  Appropriate  Affect:  Appropriate  Cognitive:  Appropriate  Insight: Appropriate  Engagement in Group:  Engaged  Modes of Intervention:  Activity  Additional Comments:    Camellia HERO Areeb Corron 01/26/2024, 7:02 PM

## 2024-01-26 NOTE — Plan of Care (Signed)
  Problem: Education: Goal: Emotional status will improve Outcome: Not Progressing Goal: Mental status will improve Outcome: Not Progressing   Problem: Coping: Goal: Ability to demonstrate self-control will improve Outcome: Not Progressing   Problem: Safety: Goal: Periods of time without injury will increase Outcome: Progressing

## 2024-01-26 NOTE — Plan of Care (Signed)
  Problem: Education: Goal: Emotional status will improve Outcome: Progressing Goal: Verbalization of understanding the information provided will improve Outcome: Progressing   Problem: Activity: Goal: Interest or engagement in activities will improve Outcome: Progressing   Problem: Coping: Goal: Ability to verbalize frustrations and anger appropriately will improve Outcome: Progressing Goal: Ability to demonstrate self-control will improve Outcome: Progressing   Problem: Safety: Goal: Periods of time without injury will increase Outcome: Progressing

## 2024-01-26 NOTE — Progress Notes (Signed)
   01/26/24 1000  Psych Admission Type (Psych Patients Only)  Admission Status Voluntary  Psychosocial Assessment  Patient Complaints Anxiety  Eye Contact Fair  Facial Expression Animated;Anxious  Affect Anxious  Speech Soft  Interaction Assertive  Motor Activity Pacing  Appearance/Hygiene Disheveled  Behavior Characteristics Cooperative;Anxious  Mood Anxious;Pleasant  Thought Process  Coherency WDL  Content Preoccupation  Delusions None reported or observed  Perception WDL  Hallucination None reported or observed  Judgment Impaired  Confusion WDL  Danger to Self  Current suicidal ideation? Denies  Agreement Not to Harm Self Yes  Description of Agreement verbal  Danger to Others  Danger to Others None reported or observed   Patient states  I just sleeps and eats. Patient had Tylenol  x 2 for headache. Encouraged for ADLs. Patient stated that she is going to take a shower before she goes to bed.

## 2024-01-26 NOTE — Progress Notes (Signed)
 [facility] MD Progress Note  01/26/2024 4:48 PM Allison Bolton  MRN:  [id number]  53 year old female with medical history of asthma, fibromyalgia, diabetes mellitus, ovarian and cervical cancer, possible colon cancer, and Mnire's disease, as well as psychiatric history of bipolar disorder and polysubstance abuse, presented to the emergency department with bizarre and chaotic behavior, gross disorganization, and positive urine drug screen for benzodiazepines, amphetamines, and THC. She was administered Geodon  and Ativan  in the ED.   Subjective:  Chart reviewed, case discussed in multidisciplinary meeting, patient seen during rounds.  Patient seen today for follow-up psychiatric evaluation. Initially observed sleeping in bed; awakens for interview. Provides brief, superficial responses. Reports mood is stable. Denies suicidal ideation (SI), homicidal ideation (HI), hallucinations, paranoia, or delusions. Denies any current needs or physical complaints. No medication side effects reported. Has been noted walking in hall several times this shift, pt has greeted provider casually without distress. Continued observation is warranted given superficial engagement and recent medication adjustments. Patient remains appropriate for this acute inpatient psychiatric setting for ongoing monitoring, safety, and medication management.  Sleep: UTA  Appetite:  Fair  Past Psychiatric History: see h&P Family History:  Family History  Problem Relation Age of Onset   Thyroid  disease Mother    Colon cancer Father    Breast cancer Maternal Aunt    Colon cancer Brother    Colon cancer Other        paternal and maternal grandfather   Meniere's disease Other    Anesthesia problems Neg Hx    Hypotension Neg Hx    Malignant hyperthermia Neg Hx    Pseudochol deficiency Neg Hx    Social History:  Social History   Substance and Sexual Activity  Alcohol Use No   Comment: not since June 2012     Social History    Substance and Sexual Activity  Drug Use No   Comment: patient denies any-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    Social History   Socioeconomic History   Marital status: Legally Separated    Spouse name: Not on file   Number of children: Not on file   Years of education: Not on file   Highest education level: Not on file  Occupational History   Not on file  Tobacco Use   Smoking status: Every Day    Current packs/day: 0.00    Average packs/day: 0.5 packs/day for 37.3 years (18.7 ttl pk-yrs)    Types: Cigarettes    Start date: 07/07/1981    Last attempt to quit: 11/05/2018    Years since quitting: 5.2   Smokeless tobacco: Never  Vaping Use   Vaping status: Never Used  Substance and Sexual Activity   Alcohol use: No    Comment: not since June 2012   Drug use: No    Comment: patient denies any-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   Sexual activity: Never    Birth control/protection: Surgical  Other Topics Concern   Not on file  Social History Narrative   Not on file   Social Drivers of Health   Financial Resource Strain: Not on file  Food Insecurity: Food Insecurity Present (01/15/2024)   Hunger Vital Sign    Worried About Running Out of Food in the Last Year: Often true    Ran Out of Food in the Last Year: Often true  Transportation Needs: Unmet Transportation Needs (01/15/2024)   PRAPARE - Administrator, Civil Service (Medical):  Yes    Lack of Transportation (Non-Medical): Yes  Physical Activity: Not on file  Stress: Not on file  Social Connections: Unknown (10/14/2021)   Received from [facility]   Social Connections    Do your friends and family support you?: Not on file    What agencies support you?: Not on file   Past Medical History:  Past Medical History:  Diagnosis Date   Arthritis    Asthma    Bipolar 1 disorder (HCC)    Diverticulosis    Dysrhythmia    sts I have heart palpitations   Fibromyalgia    H/O  hiatal hernia    4   Headache(784.0)    High blood pressure    Meniere's disease    S/P colonoscopy    Dr. Golda 2010: few small diverticula at sigmoid. otherwise normal.    Shortness of breath     Past Surgical History:  Procedure Laterality Date   ABDOMINAL HYSTERECTOMY     APPENDECTOMY     CESAREAN SECTION     CHOLECYSTECTOMY     COLONOSCOPY N/A 12/25/2023   Procedure: COLONOSCOPY;  Surgeon: Eartha Angelia Sieving, MD;  Location: [facility];  Service: Gastroenterology;  Laterality: N/A;  11:30am, asa 1   exploratory laparoscopy     FOOT SURGERY     HEMORRHOID SURGERY     LAPAROSCOPIC APPENDECTOMY  02/19/2011   Procedure: APPENDECTOMY LAPAROSCOPIC;  Surgeon: Oneil DELENA Budge;  Location: [facility] OR[facility];  Service: General;  Laterality: N/A;   LAPAROSCOPY  02/19/2011   Procedure: LAPAROSCOPY DIAGNOSTIC;  Surgeon: Oneil DELENA Budge;  Location: [facility] OR[facility];  Service: General;  Laterality: N/A;   left ovarian removal     multiple hernia repairs     Right ovarian removal     TONSILLECTOMY     TONSILLECTOMY AND ADENOIDECTOMY     tubes in ears     UMBILICAL HERNIA REPAIR  Dec 2011   Dr. Budge   wisdom teeth removal      Current Medications: Current Facility-Administered Medications  Medication Dose Route Frequency Provider Last Rate Last Admin   acetaminophen  (TYLENOL ) tablet 650 mg  650 mg Oral Q6H PRN Bobbitt, Shalon E, NP   650 mg at 01/26/24 1056   alum & mag hydroxide-simeth (MAALOX/MYLANTA) 200-200-20 MG/5ML suspension 30 mL  30 mL Oral Q4H PRN Bobbitt, Shalon E, NP   30 mL at 01/26/24 1124   benzocaine  (ORAJEL) 10 % mucosal gel   Mouth/Throat TID PRN Trudy Carwin, NP   Given at 01/24/24 0819   haloperidol  (HALDOL ) tablet 5 mg  5 mg Oral TID PRN Bobbitt, Shalon E, NP   5 mg at 01/25/24 2059   And   diphenhydrAMINE  (BENADRYL ) capsule 50 mg  50 mg Oral TID PRN Bobbitt, Shalon E, NP   50 mg at 01/25/24 2059   haloperidol  lactate (HALDOL ) injection 5 mg  5 mg Intramuscular TID PRN  Bobbitt, Shalon E, NP   5 mg at 01/24/24 1430   And   diphenhydrAMINE  (BENADRYL ) injection 50 mg  50 mg Intramuscular TID PRN Bobbitt, Shalon E, NP   50 mg at 01/24/24 1431   And   LORazepam  (ATIVAN ) injection 2 mg  2 mg Intramuscular TID PRN Bobbitt, Shalon E, NP   2 mg at 01/24/24 1431   haloperidol  lactate (HALDOL ) injection 10 mg  10 mg Intramuscular TID PRN Bobbitt, Shalon E, NP   10 mg at 01/20/24 1413   And   diphenhydrAMINE  (  BENADRYL ) injection 50 mg  50 mg Intramuscular TID PRN Bobbitt, Shalon E, NP   50 mg at 01/20/24 1414   And   LORazepam  (ATIVAN ) injection 2 mg  2 mg Intramuscular TID PRN Bobbitt, Shalon E, NP   2 mg at 01/25/24 2059   divalproex  (DEPAKOTE ) DR tablet 750 mg  750 mg Oral Q12H Millington, Matthew E, PA-C   750 mg at 01/26/24 9170   fluPHENAZine  (PROLIXIN ) tablet 5 mg  5 mg Oral TID Cleotilde Hoy HERO, NP   5 mg at 01/26/24 1234   hydrOXYzine  (ATARAX ) tablet 25 mg  25 mg Oral TID PRN Bobbitt, Shalon E, NP   25 mg at 01/25/24 1507   magnesium  hydroxide (MILK OF MAGNESIA) suspension 30 mL  30 mL Oral Daily PRN Bobbitt, Shalon E, NP       nicotine  (NICODERM CQ  - dosed in mg/24 hours) patch 14 mg  14 mg Transdermal Daily Jadapalle, Sree, MD   14 mg at 01/26/24 9170   nicotine  polacrilex (NICORETTE ) gum 2 mg  2 mg Oral PRN Jadapalle, Sree, MD   2 mg at 01/25/24 1703   OLANZapine  (ZYPREXA ) tablet 10 mg  10 mg Oral QHS Cleotilde Hoy HERO, NP       ondansetron  (ZOFRAN -ODT) disintegrating tablet 4 mg  4 mg Oral Q8H PRN Cleotilde Hoy HERO, NP   4 mg at 01/25/24 2059   phenylephrine -shark liver oil-mineral oil-petrolatum  (PREPARATION H) rectal ointment 1 Application  1 Application Rectal BID PRN Millington, Matthew E, PA-C   1 Application at 01/19/24 2257   traZODone  (DESYREL ) tablet 50 mg  50 mg Oral QHS PRN Bobbitt, Shalon E, NP   50 mg at 01/25/24 2058    Lab Results:  Results for orders placed or performed during the hospital encounter of 01/15/24 (from the past 48 hours)   Urinalysis, Complete w Microscopic -Urine, Clean Catch     Status: Abnormal   Collection Time: 01/25/24  3:43 PM  Result Value Ref Range   Color, Urine YELLOW (A) YELLOW   APPearance HAZY (A) CLEAR   Specific Gravity, Urine 1.018 1.005 - 1.030   pH 6.0 5.0 - 8.0   Glucose, UA NEGATIVE NEGATIVE mg/dL   Hgb urine dipstick NEGATIVE NEGATIVE   Bilirubin Urine NEGATIVE NEGATIVE   Ketones, ur 5 (A) NEGATIVE mg/dL   Protein, ur NEGATIVE NEGATIVE mg/dL   Nitrite NEGATIVE NEGATIVE   Leukocytes,Ua SMALL (A) NEGATIVE   RBC / HPF 0-5 0 - 5 RBC/hpf   WBC, UA 0-5 0 - 5 WBC/hpf   Bacteria, UA RARE (A) NONE SEEN   Squamous Epithelial / HPF 0-5 0 - 5 /HPF    Comment: Performed at [facility], [address] [place], [place] [address]     Blood Alcohol level:  Lab Results  Component Value Date   [facility] <15 01/14/2024   ETH <10 05/07/2019    Metabolic Disorder Labs: Lab Results  Component Value Date   HGBA1C 5.1 01/18/2024   MPG 99.67 01/18/2024   MPG 105.41 05/11/2019   Lab Results  Component Value Date   PROLACTIN 24.1 (H) 05/11/2019   Lab Results  Component Value Date   CHOL 154 01/18/2024   TRIG 180 (H) 01/18/2024   HDL 49 01/18/2024   CHOLHDL 3.1 01/18/2024   VLDL 36 01/18/2024   LDLCALC 69 01/18/2024   LDLCALC 82 05/11/2019      Psychiatric Specialty Exam:  Presentation  General Appearance:  Casual; Fairly Groomed  Eye  Contact: Fleeting  Speech: Clear and Coherent  Speech Volume: Decreased    Mood and Affect  Mood: Hopeless; Dysphoric  Affect: Congruent   Thought Process  Thought Processes: Disorganized; Other (comment) (able to follow conversation with redirection)  Descriptions of Associations:Tangential  Orientation:Full (Time, Place and Person)  Thought Content:Rumination; Perseveration  Hallucinations:Hallucinations: None   Ideas of Reference:None  Suicidal Thoughts:Suicidal Thoughts: No   Homicidal Thoughts:Homicidal  Thoughts: No    Sensorium  Memory:Immediate Fair; Remote Fair  Judgment: Fair  Insight: Poor   Executive Functions  Concentration: Fair  Attention Span: Fair  Recall: Good  Fund of Knowledge: Good  Language: Good   Psychomotor Activity  Psychomotor Activity: Psychomotor Activity: Normal   Musculoskeletal: Strength & Muscle Tone: within normal limits Gait & Station: normal Assets  Assets:No data recorded   Physical Exam: Physical Exam Vitals and nursing note reviewed.  HENT:     Head: Atraumatic.  Eyes:     Extraocular Movements: Extraocular movements intact.  Pulmonary:     Effort: Pulmonary effort is normal.  Neurological:     Mental Status: She is alert and oriented to person, place, and time.    Review of Systems  Psychiatric/Behavioral:  Positive for hallucinations.    Blood pressure 112/86, pulse 90, temperature (!) 97.2 F (36.2 C), resp. rate 18, height 5' 6 (1.676 m), weight 77.1 kg, SpO2 99%. Body mass index is 27.44 kg/m.  Diagnosis: Principal Problem:   Psychoactive substance-induced psychosis (HCC) Active Problems:   Bipolar disorder (HCC)   Substance induced mood disorder (HCC)  Continues to present with disorganized thought content however there is noted improvement from admission on that she can be redirected to topic. No longer irritable and remains cooperative. Given duration of symptoms no longer diagnostically considering this a purely substance induced event. Prolixin  has been added related to continued symptoms with plan to wean Zyprexa  in favor of typical antipsychotic in hopes of improved symptom control. UA ordered related to concern for kidney stone however have discussed in team and there have been no episode of acute pain and pt is often noted to be sleeping despite recent complaints. Will continue to evaluate.      PLAN: Safety and Monitoring:  -- Voluntary admission to inpatient psychiatric unit for safety,  stabilization and treatment  -- Daily contact with patient to assess and evaluate symptoms and progress in treatment  -- Patient's case to be discussed in multi-disciplinary team meeting  -- Observation Level : q15 minute checks  -- Vital signs:  q12 hours  -- Precautions: suicide, elopement, and assault -- Encouraged patient to participate in unit milieu and in scheduled group therapies  2. Psychiatric Diagnoses and Treatment:   Polysubstance Use Disorder (benzodiazepines, amphetamines, cannabis) Rule out Bipolar I Disorder Rule out Schizophrenia Spectrum or Schizoaffective Disorder   Admit to inpatient setting for continued monitoring and stabilization Evaluate for capacity and diagnostic clarification as organization improves  It is the clinical judgment of this provider that in her current disorganized and unpredictable mental state, the patient remains a danger to herself or others without inpatient psychiatric monitoring. She lacks insight and judgment and is unable to provide reliable self-report.   Patient showing some improvement, will continue current course with the following adjustments: Decrease Zyprexa  to 10 mg po HS D/c clonidine  0.1 mg daily for impulsivity and agitation Increase depakote  to 750 mg BID level on 7/20 was 39, repeat VPA and LFTs 01/29/24 at 0500 Increase  prolixin  5 mg tid  The risks/benefits/side-effects/alternatives to this medication were discussed in detail with the patient and time was given for questions. The patient consents to medication trial. -- Metabolic profile and EKG monitoring obtained while on an atypical antipsychotic  -- Encouraged patient to participate in unit milieu and in scheduled group therapies     3. Medical Issues Being Addressed: UA ordered for reported kidney pain   no acute needs at this time, medically cleared in ED for psychiatric care  4. Discharge Planning:   -- Social work and case management to assist with discharge  planning and identification of hospital follow-up needs prior to discharge  -- Estimated LOS: 3-4 days  Hoy CHRISTELLA Pinal, NP 01/26/2024, 4:48 PM

## 2024-01-26 NOTE — Progress Notes (Signed)
 Pt presents with a brighter affect and more linear thoughts tonight compared to last night. Pt observed interacting appropriately with staff and peers on the unit. Pt complained of agitation and was given PRN medication with her scheduled night time medication. Pt given education, support, and encouragement to be active in her treatment plan. Pt being monitored Q 15 minutes for safety per unit protocol, remains safe on the unit

## 2024-01-27 MED ORDER — AZITHROMYCIN 250 MG PO TABS
250.0000 mg | ORAL_TABLET | Freq: Every day | ORAL | Status: AC
  Administered 2024-01-28 – 2024-01-31 (×4): 250 mg via ORAL
  Filled 2024-01-27 (×4): qty 1

## 2024-01-27 MED ORDER — BENZTROPINE MESYLATE 1 MG PO TABS
2.0000 mg | ORAL_TABLET | Freq: Three times a day (TID) | ORAL | Status: DC | PRN
  Administered 2024-01-27 – 2024-02-01 (×7): 2 mg via ORAL
  Filled 2024-01-27 (×7): qty 2

## 2024-01-27 MED ORDER — AZITHROMYCIN 500 MG PO TABS
500.0000 mg | ORAL_TABLET | Freq: Every day | ORAL | Status: AC
  Administered 2024-01-27: 500 mg via ORAL
  Filled 2024-01-27 (×2): qty 1

## 2024-01-27 MED ORDER — OLANZAPINE 5 MG PO TABS
5.0000 mg | ORAL_TABLET | Freq: Every day | ORAL | Status: AC
  Administered 2024-01-27 – 2024-01-28 (×2): 5 mg via ORAL
  Filled 2024-01-27 (×2): qty 1

## 2024-01-27 NOTE — Progress Notes (Signed)
   01/27/24 2200  Psych Admission Type (Psych Patients Only)  Admission Status Voluntary  Psychosocial Assessment  Patient Complaints None  Eye Contact Fair  Facial Expression Animated  Affect Appropriate to circumstance  Speech Soft  Interaction Assertive  Motor Activity Tremors  Appearance/Hygiene Unremarkable  Behavior Characteristics Cooperative;Appropriate to situation  Mood Anxious  Thought Process  Coherency WDL  Content Preoccupation  Delusions None reported or observed  Perception WDL  Hallucination None reported or observed  Judgment Impaired  Confusion WDL  Danger to Self  Current suicidal ideation? Denies  Agreement Not to Harm Self Yes  Description of Agreement verbal  Danger to Others  Danger to Others None reported or observed

## 2024-01-27 NOTE — Group Note (Signed)
 Date:  01/27/2024 Time:  4:39 PM  Group Topic/Focus:  Wellness Toolbox:   The focus of this group is to discuss various aspects of wellness, balancing those aspects and exploring ways to increase the ability to experience wellness.  Patients will create a wellness toolbox for use upon discharge.    Participation Level:  Active  Participation Quality:  Appropriate  Affect:  Appropriate  Cognitive:  Appropriate  Insight: Appropriate  Engagement in Group:  Engaged  Modes of Intervention:  Activity and Socialization  Additional Comments:    Deitra Caron [place] 01/27/2024, 4:39 PM

## 2024-01-27 NOTE — Group Note (Signed)
 [facility] LCSW Group Therapy Note   Group Date: 01/27/2024 Start Time: 1300 End Time: 1400   Type of Therapy/Topic:  Group Therapy:  Emotion Regulation  Participation Level:  Did Not Attend   Mood:  Description of Group:    The purpose of this group is to assist patients in learning to regulate negative emotions and experience positive emotions. Patients will be guided to discuss ways in which they have been vulnerable to their negative emotions. These vulnerabilities will be juxtaposed with experiences of positive emotions or situations, and patients challenged to use positive emotions to combat negative ones. Special emphasis will be placed on coping with negative emotions in conflict situations, and patients will process healthy conflict resolution skills.  Therapeutic Goals: Patient will identify two positive emotions or experiences to reflect on in order to balance out negative emotions:  Patient will label two or more emotions that they find the most difficult to experience:  Patient will be able to demonstrate positive conflict resolution skills through discussion or role plays:   Summary of Patient Progress:   Patient did not attend.     Therapeutic Modalities:   Cognitive Behavioral Therapy Feelings Identification Dialectical Behavioral Therapy   Allison CHRISTELLA Kerns, LCSW

## 2024-01-27 NOTE — Plan of Care (Signed)
   Problem: Education: Goal: Emotional status will improve Outcome: Progressing Goal: Mental status will improve Outcome: Progressing

## 2024-01-27 NOTE — Progress Notes (Signed)
 [facility] MD Progress Note  01/27/2024 3:02 PM Allison Bolton  MRN:  [id number]  53 year old female with medical history of asthma, fibromyalgia, diabetes mellitus, ovarian and cervical cancer, possible colon cancer, and Mnire's disease, as well as psychiatric history of bipolar disorder and polysubstance abuse, presented to the emergency department with bizarre and chaotic behavior, gross disorganization, and positive urine drug screen for benzodiazepines, amphetamines, and THC. Allison Bolton was administered Geodon  and Ativan  in the ED.   Subjective:  Chart reviewed, case discussed in multidisciplinary meeting, patient seen during rounds.  Patient seen today for follow-up psychiatric evaluation. Initially observed sleeping in bed; awakens for interview. Provides brief, superficial responses. Reports mood is stable. Denies suicidal ideation (SI), homicidal ideation (HI), hallucinations, paranoia, or delusions. Denies any current needs or physical complaints. No medication side effects reported. Has been noted walking in hall several times this shift, pt has greeted provider casually without distress. Continued observation is warranted given superficial engagement and recent medication adjustments. Patient remains appropriate for this acute inpatient psychiatric setting for ongoing monitoring, safety, and medication management.   Right sided jaw swelling noted, pt complains of tooth pain, Allison Bolton is allergic to Amoxicillin, pt afebrile, discussed with Dr Jadapalle for direction, will start Azithromycin  500mg  po X1 today followed by 250mg  po daily tomorrow X4 days. Also ordering salt water  swish and spit. Will monitor for symptoms acute/worsening, and make appropriate referrals for follow up in the outpatient setting   Decrease Zyprexa  5mg  po at bedtime for 2 more nights then DC  Noted restlessness and hypermobility is right forearm new is assessment last evening and today, pt states I know I have had this my whole  family has Parkinson's discussed in team along with Dr Layne, reviewed plan to monitor for symptom consistency or inconsistency, will order Cogentin  2mg  po Q8h prn akathisia, nursing aware to monitor for symptoms and plan to adjust plan of care as needed moving forward.  Sleep: UTA  Appetite:  Fair  Past Psychiatric History: see h&P Family History:  Family History  Problem Relation Age of Onset   Thyroid  disease Mother    Colon cancer Father    Breast cancer Maternal Aunt    Colon cancer Brother    Colon cancer Other        paternal and maternal grandfather   Meniere's disease Other    Anesthesia problems Neg Hx    Hypotension Neg Hx    Malignant hyperthermia Neg Hx    Pseudochol deficiency Neg Hx    Social History:  Social History   Substance and Sexual Activity  Alcohol Use No   Comment: not since June 2012     Social History   Substance and Sexual Activity  Drug Use No   Comment: patient denies any-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    Social History   Socioeconomic History   Marital status: Legally Separated    Spouse name: Not on file   Number of children: Not on file   Years of education: Not on file   Highest education level: Not on file  Occupational History   Not on file  Tobacco Use   Smoking status: Every Day    Current packs/day: 0.00    Average packs/day: 0.5 packs/day for 37.3 years (18.7 ttl pk-yrs)    Types: Cigarettes    Start date: 07/07/1981    Last attempt to quit: 11/05/2018    Years since quitting: 5.2   Smokeless tobacco: Never  Vaping Use  Vaping status: Never Used  Substance and Sexual Activity   Alcohol use: No    Comment: not since June 2012   Drug use: No    Comment: patient denies any-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   Sexual activity: Never    Birth control/protection: Surgical  Other Topics Concern   Not on file  Social History Narrative   Not on file   Social Drivers of Health   Financial Resource  Strain: Not on file  Food Insecurity: Food Insecurity Present (01/15/2024)   Hunger Vital Sign    Worried About Running Out of Food in the Last Year: Often true    Ran Out of Food in the Last Year: Often true  Transportation Needs: Unmet Transportation Needs (01/15/2024)   PRAPARE - Administrator, Civil Service (Medical): Yes    Lack of Transportation (Non-Medical): Yes  Physical Activity: Not on file  Stress: Not on file  Social Connections: Unknown (10/14/2021)   Received from [facility]   Social Connections    Do your friends and family support you?: Not on file    What agencies support you?: Not on file   Past Medical History:  Past Medical History:  Diagnosis Date   Arthritis    Asthma    Bipolar 1 disorder (HCC)    Diverticulosis    Dysrhythmia    sts I have heart palpitations   Fibromyalgia    H/O hiatal hernia    4   Headache(784.0)    High blood pressure    Meniere's disease    S/P colonoscopy    Dr. Golda 2010: few small diverticula at sigmoid. otherwise normal.    Shortness of breath     Past Surgical History:  Procedure Laterality Date   ABDOMINAL HYSTERECTOMY     APPENDECTOMY     CESAREAN SECTION     CHOLECYSTECTOMY     COLONOSCOPY N/A 12/25/2023   Procedure: COLONOSCOPY;  Surgeon: Eartha Angelia Sieving, MD;  Location: [facility];  Service: Gastroenterology;  Laterality: N/A;  11:30am, asa 1   exploratory laparoscopy     FOOT SURGERY     HEMORRHOID SURGERY     LAPAROSCOPIC APPENDECTOMY  02/19/2011   Procedure: APPENDECTOMY LAPAROSCOPIC;  Surgeon: Oneil DELENA Budge;  Location: [facility];  Service: General;  Laterality: N/A;   LAPAROSCOPY  02/19/2011   Procedure: LAPAROSCOPY DIAGNOSTIC;  Surgeon: Oneil DELENA Budge;  Location: [facility];  Service: General;  Laterality: N/A;   left ovarian removal     multiple hernia repairs     Right ovarian removal     TONSILLECTOMY     TONSILLECTOMY AND ADENOIDECTOMY     tubes in ears      UMBILICAL HERNIA REPAIR  Dec 2011   Dr. Budge   wisdom teeth removal      Current Medications: Current Facility-Administered Medications  Medication Dose Route Frequency Provider Last Rate Last Admin   acetaminophen  (TYLENOL ) tablet 650 mg  650 mg Oral Q6H PRN Bobbitt, Shalon E, NP   650 mg at 01/27/24 0951   alum & mag hydroxide-simeth (MAALOX/MYLANTA) 200-200-20 MG/5ML suspension 30 mL  30 mL Oral Q4H PRN Bobbitt, Shalon E, NP   30 mL at 01/26/24 1124   [START ON 01/28/2024] azithromycin  (ZITHROMAX ) tablet 250 mg  250 mg Oral Daily Cleotilde Hoy HERO, NP       azithromycin  (ZITHROMAX ) tablet 500 mg  500 mg Oral Daily Cleotilde Hoy HERO, NP  benzocaine  (ORAJEL) 10 % mucosal gel   Mouth/Throat TID PRN Trudy Carwin, NP   Given at 01/24/24 9180   haloperidol  (HALDOL ) tablet 5 mg  5 mg Oral TID PRN Bobbitt, Shalon E, NP   5 mg at 01/25/24 2059   And   diphenhydrAMINE  (BENADRYL ) capsule 50 mg  50 mg Oral TID PRN Bobbitt, Shalon E, NP   50 mg at 01/25/24 2059   haloperidol  lactate (HALDOL ) injection 5 mg  5 mg Intramuscular TID PRN Bobbitt, Shalon E, NP   5 mg at 01/24/24 1430   And   diphenhydrAMINE  (BENADRYL ) injection 50 mg  50 mg Intramuscular TID PRN Bobbitt, Shalon E, NP   50 mg at 01/24/24 1431   And   LORazepam  (ATIVAN ) injection 2 mg  2 mg Intramuscular TID PRN Bobbitt, Shalon E, NP   2 mg at 01/24/24 1431   haloperidol  lactate (HALDOL ) injection 10 mg  10 mg Intramuscular TID PRN Bobbitt, Shalon E, NP   10 mg at 01/20/24 1413   And   diphenhydrAMINE  (BENADRYL ) injection 50 mg  50 mg Intramuscular TID PRN Bobbitt, Shalon E, NP   50 mg at 01/20/24 1414   And   LORazepam  (ATIVAN ) injection 2 mg  2 mg Intramuscular TID PRN Bobbitt, Shalon E, NP   2 mg at 01/26/24 2054   divalproex  (DEPAKOTE ) DR tablet 750 mg  750 mg Oral Q12H Millington, Matthew E, PA-C   750 mg at 01/27/24 9245   fluPHENAZine  (PROLIXIN ) tablet 5 mg  5 mg Oral TID Cleotilde Hoy HERO, NP   5 mg at 01/27/24 1222    hydrOXYzine  (ATARAX ) tablet 25 mg  25 mg Oral TID PRN Bobbitt, Shalon E, NP   25 mg at 01/25/24 1507   magnesium  hydroxide (MILK OF MAGNESIA) suspension 30 mL  30 mL Oral Daily PRN Bobbitt, Shalon E, NP       nicotine  (NICODERM CQ  - dosed in mg/24 hours) patch 14 mg  14 mg Transdermal Daily Jadapalle, Sree, MD   14 mg at 01/26/24 9170   nicotine  polacrilex (NICORETTE ) gum 2 mg  2 mg Oral PRN Jadapalle, Sree, MD   2 mg at 01/26/24 1707   OLANZapine  (ZYPREXA ) tablet 10 mg  10 mg Oral QHS Cleotilde Hoy HERO, NP   10 mg at 01/26/24 2054   ondansetron  (ZOFRAN -ODT) disintegrating tablet 4 mg  4 mg Oral Q8H PRN Cleotilde Hoy HERO, NP   4 mg at 01/25/24 2059   phenylephrine -shark liver oil-mineral oil-petrolatum  (PREPARATION H) rectal ointment 1 Application  1 Application Rectal BID PRN Millington, Matthew E, PA-C   1 Application at 01/19/24 2257   traZODone  (DESYREL ) tablet 50 mg  50 mg Oral QHS PRN Bobbitt, Shalon E, NP   50 mg at 01/26/24 2053    Lab Results:  Results for orders placed or performed during the hospital encounter of 01/15/24 (from the past 48 hours)  Urinalysis, Complete w Microscopic -Urine, Clean Catch     Status: Abnormal   Collection Time: 01/25/24  3:43 PM  Result Value Ref Range   Color, Urine YELLOW (A) YELLOW   APPearance HAZY (A) CLEAR   Specific Gravity, Urine 1.018 1.005 - 1.030   pH 6.0 5.0 - 8.0   Glucose, UA NEGATIVE NEGATIVE mg/dL   Hgb urine dipstick NEGATIVE NEGATIVE   Bilirubin Urine NEGATIVE NEGATIVE   Ketones, ur 5 (A) NEGATIVE mg/dL   Protein, ur NEGATIVE NEGATIVE mg/dL   Nitrite NEGATIVE NEGATIVE   Leukocytes,Ua SMALL (  A) NEGATIVE   RBC / HPF 0-5 0 - 5 RBC/hpf   WBC, UA 0-5 0 - 5 WBC/hpf   Bacteria, UA RARE (A) NONE SEEN   Squamous Epithelial / HPF 0-5 0 - 5 /HPF    Comment: Performed at [facility], [address] [place], [place] [address]     Blood Alcohol level:  Lab Results  Component Value Date   [facility] <15 01/14/2024   ETH <10  05/07/2019    Metabolic Disorder Labs: Lab Results  Component Value Date   HGBA1C 5.1 01/18/2024   MPG 99.67 01/18/2024   MPG 105.41 05/11/2019   Lab Results  Component Value Date   PROLACTIN 24.1 (H) 05/11/2019   Lab Results  Component Value Date   CHOL 154 01/18/2024   TRIG 180 (H) 01/18/2024   HDL 49 01/18/2024   CHOLHDL 3.1 01/18/2024   VLDL 36 01/18/2024   LDLCALC 69 01/18/2024   LDLCALC 82 05/11/2019      Psychiatric Specialty Exam:  Presentation  General Appearance:  Casual; Fairly Groomed  Eye Contact: Fleeting  Speech: Clear and Coherent  Speech Volume: Decreased    Mood and Affect  Mood: Hopeless; Dysphoric  Affect: Congruent   Thought Process  Thought Processes: Disorganized; Other (comment) (able to follow conversation with redirection)  Descriptions of Associations:Tangential  Orientation:Full (Time, Place and Person)  Thought Content:Rumination; Perseveration  Hallucinations:No data recorded   Ideas of Reference:None  Suicidal Thoughts:No data recorded   Homicidal Thoughts:No data recorded    Sensorium  Memory:Immediate Fair; Remote Fair  Judgment: Fair  Insight: Poor   Executive Functions  Concentration: Fair  Attention Span: Fair  Recall: Good  Fund of Knowledge: Good  Language: Good   Psychomotor Activity  Psychomotor Activity: No data recorded   Musculoskeletal: Strength & Muscle Tone: within normal limits Gait & Station: normal Assets  Assets:No data recorded   Physical Exam: Physical Exam Vitals and nursing note reviewed.  HENT:     Head: Atraumatic.  Eyes:     Extraocular Movements: Extraocular movements intact.  Pulmonary:     Effort: Pulmonary effort is normal.  Neurological:     Mental Status: Allison Bolton is alert and oriented to person, place, and time.    Review of Systems  Psychiatric/Behavioral:  Positive for hallucinations.    Blood pressure (!) 132/92, pulse 97,  temperature (!) 97.4 F (36.3 C), resp. rate 18, height 5' 6 (1.676 m), weight 77.1 kg, SpO2 97%. Body mass index is 27.44 kg/m.  Diagnosis: Principal Problem:   Psychoactive substance-induced psychosis (HCC) Active Problems:   Bipolar disorder (HCC)   Substance induced mood disorder (HCC)  Continues to present with disorganized thought content however there is noted improvement from admission on that Allison Bolton can be redirected to topic. No longer irritable and remains cooperative. Given duration of symptoms no longer diagnostically considering this a purely substance induced event. Prolixin  has been added related to continued symptoms with plan to wean Zyprexa  in favor of typical antipsychotic in hopes of improved symptom control. UA ordered related to concern for kidney stone however have discussed in team and there have been no episode of acute pain and pt is often noted to be sleeping despite recent complaints. Will continue to evaluate.      PLAN: Safety and Monitoring:  -- Voluntary admission to inpatient psychiatric unit for safety, stabilization and treatment  -- Daily contact with patient to assess and evaluate symptoms and progress in treatment  -- Patient's case  to be discussed in multi-disciplinary team meeting  -- Observation Level : q15 minute checks  -- Vital signs:  q12 hours  -- Precautions: suicide, elopement, and assault -- Encouraged patient to participate in unit milieu and in scheduled group therapies  2. Psychiatric Diagnoses and Treatment:   Polysubstance Use Disorder (benzodiazepines, amphetamines, cannabis) Rule out Bipolar I Disorder Rule out Schizophrenia Spectrum or Schizoaffective Disorder   Admit to inpatient setting for continued monitoring and stabilization Evaluate for capacity and diagnostic clarification as organization improves  It is the clinical judgment of this provider that in her current disorganized and unpredictable mental state, the patient  remains a danger to herself or others without inpatient psychiatric monitoring. Allison Bolton lacks insight and judgment and is unable to provide reliable self-report.   Patient showing some improvement, will continue current course with the following adjustments: Decrease Zyprexa  to 10 mg po HS D/c clonidine  0.1 mg daily for impulsivity and agitation Increase depakote  to 750 mg BID level on 7/20 was 39, repeat VPA and LFTs 01/29/24 at 0500 Increase  prolixin  5 mg tid  The risks/benefits/side-effects/alternatives to this medication were discussed in detail with the patient and time was given for questions. The patient consents to medication trial. -- Metabolic profile and EKG monitoring obtained while on an atypical antipsychotic  -- Encouraged patient to participate in unit milieu and in scheduled group therapies     3. Medical Issues Being Addressed: UA ordered for reported kidney pain   no acute needs at this time, medically cleared in ED for psychiatric care  4. Discharge Planning:   -- Social work and case management to assist with discharge planning and identification of hospital follow-up needs prior to discharge  -- Estimated LOS: 3-4 days  Hoy CHRISTELLA Pinal, NP 01/27/2024, 3:02 PM

## 2024-01-27 NOTE — Group Note (Signed)
 Date:  01/27/2024 Time:  8:37 PM  Group Topic/Focus:  Orientation:   The focus of this group is to educate the patient on the purpose and policies of crisis stabilization and provide a format to answer questions about their admission.  The group details unit policies and expectations of patients while admitted.    Participation Level:  Active  Participation Quality:  Appropriate and Attentive  Affect:  Appropriate  Cognitive:  Alert and Appropriate  Insight: Appropriate and Good  Engagement in Group:  Engaged and Improving  Modes of Intervention:  Clarification, Discussion, Education, Orientation, Rapport Building, and Support  Additional Comments:     Aven Christen 01/27/2024, 8:37 PM

## 2024-01-27 NOTE — Progress Notes (Signed)
   01/27/24 1000  Psych Admission Type (Psych Patients Only)  Admission Status Voluntary  Psychosocial Assessment  Patient Complaints None  Eye Contact Fair  Facial Expression Animated  Affect Appropriate to circumstance  Speech Soft  Interaction Assertive  Motor Activity Other (Comment) (WNL)  Appearance/Hygiene Unremarkable  Behavior Characteristics Cooperative  Mood Threatening  Thought Process  Coherency WDL  Content Preoccupation  Delusions None reported or observed  Perception WDL  Hallucination None reported or observed  Judgment Impaired  Confusion WDL  Danger to Self  Current suicidal ideation? Denies  Agreement Not to Harm Self Yes  Description of Agreement verbal  Danger to Others  Danger to Others None reported or observed   Noted tremors on right arm. Patient states  its there for couple of years. Patient slept between meals. Patient stated that her goal is to go home.

## 2024-01-27 NOTE — Group Note (Signed)
 Date:  01/27/2024 Time:  11:19 AM  Group Topic/Focus:  Goals Group:   The focus of this group is to help patients establish daily goals to achieve during treatment and discuss how the patient can incorporate goal setting into their daily lives to aide in recovery.    Participation Level:  Did Not Attend   Deitra Clap [facility] 01/27/2024, 11:19 AM

## 2024-01-27 NOTE — Plan of Care (Signed)
   Problem: Education: Goal: Knowledge of [facility] Education information/materials will improve Outcome: Progressing Goal: Emotional status will improve Outcome: Progressing Goal: Mental status will improve Outcome: Progressing Goal: Verbalization of understanding the information provided will improve Outcome: Progressing   Problem: Activity: Goal: Interest or engagement in activities will improve Outcome: Progressing   Problem: Coping: Goal: Ability to verbalize frustrations and anger appropriately will improve Outcome: Progressing

## 2024-01-27 NOTE — Plan of Care (Signed)
  Problem: Education: Goal: Emotional status will improve Outcome: Progressing Goal: Mental status will improve Outcome: Progressing   Problem: Coping: Goal: Ability to verbalize frustrations and anger appropriately will improve Outcome: Progressing   Problem: Health Behavior/Discharge Planning: Goal: Identification of resources available to assist in meeting health care needs will improve Outcome: Progressing Goal: Compliance with treatment plan for underlying cause of condition will improve Outcome: Progressing   Problem: Safety: Goal: Periods of time without injury will increase Outcome: Progressing

## 2024-01-27 NOTE — [facility] Counselor (Signed)
 Referral was sent to [facility] (fax [phone]) yesterday for pt.   CSW contacted [facility] [phone]) to confirm receipt of referral. Receipt was confirmed and CSW was informed that they are not able to take new clients at this time. CSW was also informed that they will hold on to paperwork for when they are able to begin taking new clients. No other concerns expressed. Contact ended without incident.   Nadara SAUNDERS. Chaim, MSW, LCSW, LCAS 01/27/2024 3:40 PM

## 2024-01-28 ENCOUNTER — Encounter (HOSPITAL_COMMUNITY)

## 2024-01-28 NOTE — Progress Notes (Signed)
 Pt presents with a brighter affect than when this writer had her earlier this admission. Pt observed by this Clinical research associate interacting appropriately with staff and peers on the unit. Pt compliant with medication administration per MD orders. Pt given education, support, and encouragement to be active in her treatment plan. Pt being monitored Q 15 minutes for safety per unit protocol, remains safe on the unit

## 2024-01-28 NOTE — [facility] Counselor (Signed)
 CSW met with pt briefly to discuss discharge destination. Pt stated that she was planning to go to her aunts but aunt is going out of town to [place]. Pt asked CSW to reach out to her daughter, [name]. CSW explained that he had tried but would try again. CSW asked if he could contact her other daughter, Ileana as her information was in the pt's chart. Pt stated that Kayla lives in [place] . But pt agreed, stating that Kayla might be able to get in touch with pt's other daughter. No other concerns expressed. Contact ended without incident.    CSW attempted contact with daughter, [name] Jenkins Porch [phone]). Contact was unable to be established. Call went directly to voicemail and mailbox was full so message was unable to be left.   CSW attempted contact with daughter, Ileana Europe [phone]). Contact was unable to be established but HIPAA compliant voicemail was left with contact information for follow through.  CSW attempted contact with uncle, Larnell Carota [phone]). Contact was unable to be established. Call went directly to voicemail which had not been set up.   CSW updated pt about contacts. She voiced understanding. CSW inquired if contact could be made with the aunt. She agreed and gave CSW contact information.   CSW attempted contact with aunt, Winton Sheller [phone]). Contact was unable to be established but HIPAA compliant voicemail was left with contact information for follow through.   As another option, CSW reached out to [facility] [phone]). CSW was informed that the shelter has been closed for the summer per the city. CSW was informed that it would not be reopening until the winter months.   Nadara SAUNDERS. Chaim, MSW, LCSW, LCAS 01/28/2024 3:55 PM

## 2024-01-28 NOTE — Progress Notes (Signed)
 [facility] MD Progress Note  01/28/2024 11:43 AM Allison Bolton  MRN:  [id number]  53 year old female with medical history of asthma, fibromyalgia, diabetes mellitus, ovarian and cervical cancer, possible colon cancer, and Mnire's disease, as well as psychiatric history of bipolar disorder and polysubstance abuse, presented to the emergency department with bizarre and chaotic behavior, gross disorganization, and positive urine drug screen for benzodiazepines, amphetamines, and THC. She was administered Geodon  and Ativan  in the ED.   Subjective:  Chart reviewed, case discussed in multidisciplinary meeting, patient seen during rounds.  Patient seen today for follow-up psychiatric evaluation. Denies symptoms of depression, anxiety, suicidal ideation (SI), homicidal ideation (HI), auditory or visual hallucinations (AVH), paranoia, and delusions. When asked about current hospitalization, patient appears confused and tearful, stating she does not recall why she was brought in and verbalizes, "I just want to go home." She expresses desire to discharge to her daughter, Allison Bolton, and provides contact number [phone]. Despite periods of confusion, patient is able to follow conversations with direction and support. Reports no medication side effects.  Patient noted wandering the halls and requiring redirection. Continues to display right arm tremor; again attributes this to "all my family has Parkinson's." No episodes of self-harm or aggression have been noted. Mood remains labile with tearfulness and perseveration around discharge and family.  Sleep: good   Appetite:  Fair  Past Psychiatric History: see h&P Family History:  Family History  Problem Relation Age of Onset   Thyroid  disease Mother    Colon cancer Father    Breast cancer Maternal Aunt    Colon cancer Brother    Colon cancer Other        paternal and maternal grandfather   Meniere's disease Other    Anesthesia problems Neg Hx     Hypotension Neg Hx    Malignant hyperthermia Neg Hx    Pseudochol deficiency Neg Hx    Social History:  Social History   Substance and Sexual Activity  Alcohol Use No   Comment: not since June 2012     Social History   Substance and Sexual Activity  Drug Use No   Comment: patient denies any-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    Social History   Socioeconomic History   Marital status: Legally Separated    Spouse name: Not on file   Number of children: Not on file   Years of education: Not on file   Highest education level: Not on file  Occupational History   Not on file  Tobacco Use   Smoking status: Every Day    Current packs/day: 0.00    Average packs/day: 0.5 packs/day for 37.3 years (18.7 ttl pk-yrs)    Types: Cigarettes    Start date: 07/07/1981    Last attempt to quit: 11/05/2018    Years since quitting: 5.2   Smokeless tobacco: Never  Vaping Use   Vaping status: Never Used  Substance and Sexual Activity   Alcohol use: No    Comment: not since June 2012   Drug use: No    Comment: patient denies any-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   Sexual activity: Never    Birth control/protection: Surgical  Other Topics Concern   Not on file  Social History Narrative   Not on file   Social Drivers of Health   Financial Resource Strain: Not on file  Food Insecurity: Food Insecurity Present (01/15/2024)   Hunger Vital Sign    Worried About  Running Out of Food in the Last Year: Often true    Ran Out of Food in the Last Year: Often true  Transportation Needs: Unmet Transportation Needs (01/15/2024)   PRAPARE - Administrator, Civil Service (Medical): Yes    Lack of Transportation (Non-Medical): Yes  Physical Activity: Not on file  Stress: Not on file  Social Connections: Unknown (10/14/2021)   Received from [facility]   Social Connections    Do your friends and family support you?: Not on file    What agencies support you?:  Not on file   Past Medical History:  Past Medical History:  Diagnosis Date   Arthritis    Asthma    Bipolar 1 disorder (HCC)    Diverticulosis    Dysrhythmia    sts I have heart palpitations   Fibromyalgia    H/O hiatal hernia    4   Headache(784.0)    High blood pressure    Meniere's disease    S/P colonoscopy    Dr. Golda 2010: few small diverticula at sigmoid. otherwise normal.    Shortness of breath     Past Surgical History:  Procedure Laterality Date   ABDOMINAL HYSTERECTOMY     APPENDECTOMY     CESAREAN SECTION     CHOLECYSTECTOMY     COLONOSCOPY N/A 12/25/2023   Procedure: COLONOSCOPY;  Surgeon: Eartha Angelia Sieving, MD;  Location: [facility];  Service: Gastroenterology;  Laterality: N/A;  11:30am, asa 1   exploratory laparoscopy     FOOT SURGERY     HEMORRHOID SURGERY     LAPAROSCOPIC APPENDECTOMY  02/19/2011   Procedure: APPENDECTOMY LAPAROSCOPIC;  Surgeon: Oneil DELENA Budge;  Location: [facility];  Service: General;  Laterality: N/A;   LAPAROSCOPY  02/19/2011   Procedure: LAPAROSCOPY DIAGNOSTIC;  Surgeon: Oneil DELENA Budge;  Location: [facility];  Service: General;  Laterality: N/A;   left ovarian removal     multiple hernia repairs     Right ovarian removal     TONSILLECTOMY     TONSILLECTOMY AND ADENOIDECTOMY     tubes in ears     UMBILICAL HERNIA REPAIR  Dec 2011   Dr. Budge   wisdom teeth removal      Current Medications: Current Facility-Administered Medications  Medication Dose Route Frequency Provider Last Rate Last Admin   acetaminophen  (TYLENOL ) tablet 650 mg  650 mg Oral Q6H PRN Bobbitt, Shalon E, NP   650 mg at 01/28/24 0802   alum & mag hydroxide-simeth (MAALOX/MYLANTA) 200-200-20 MG/5ML suspension 30 mL  30 mL Oral Q4H PRN Bobbitt, Shalon E, NP   30 mL at 01/26/24 1124   azithromycin  (ZITHROMAX ) tablet 250 mg  250 mg Oral Daily Cleotilde Hoy HERO, NP   250 mg at 01/28/24 0803   benzocaine  (ORAJEL) 10 % mucosal gel   Mouth/Throat TID PRN  Trudy Carwin, NP   Given at 01/24/24 0819   benztropine  (COGENTIN ) tablet 2 mg  2 mg Oral Q8H PRN Cleotilde Hoy HERO, NP   2 mg at 01/27/24 1629   haloperidol  (HALDOL ) tablet 5 mg  5 mg Oral TID PRN Bobbitt, Shalon E, NP   5 mg at 01/25/24 2059   And   diphenhydrAMINE  (BENADRYL ) capsule 50 mg  50 mg Oral TID PRN Bobbitt, Shalon E, NP   50 mg at 01/25/24 2059   haloperidol  lactate (HALDOL ) injection 5 mg  5 mg Intramuscular TID PRN Bobbitt, Shalon E, NP   5  mg at 01/24/24 1430   And   diphenhydrAMINE  (BENADRYL ) injection 50 mg  50 mg Intramuscular TID PRN Bobbitt, Shalon E, NP   50 mg at 01/24/24 1431   And   LORazepam  (ATIVAN ) injection 2 mg  2 mg Intramuscular TID PRN Bobbitt, Shalon E, NP   2 mg at 01/24/24 1431   haloperidol  lactate (HALDOL ) injection 10 mg  10 mg Intramuscular TID PRN Bobbitt, Shalon E, NP   10 mg at 01/20/24 1413   And   diphenhydrAMINE  (BENADRYL ) injection 50 mg  50 mg Intramuscular TID PRN Bobbitt, Shalon E, NP   50 mg at 01/20/24 1414   And   LORazepam  (ATIVAN ) injection 2 mg  2 mg Intramuscular TID PRN Bobbitt, Shalon E, NP   2 mg at 01/26/24 2054   divalproex  (DEPAKOTE ) DR tablet 750 mg  750 mg Oral Q12H Millington, Matthew E, PA-C   750 mg at 01/28/24 0802   fluPHENAZine  (PROLIXIN ) tablet 5 mg  5 mg Oral TID Cleotilde Hoy HERO, NP   5 mg at 01/28/24 9196   hydrOXYzine  (ATARAX ) tablet 25 mg  25 mg Oral TID PRN Bobbitt, Shalon E, NP   25 mg at 01/27/24 2054   magnesium  hydroxide (MILK OF MAGNESIA) suspension 30 mL  30 mL Oral Daily PRN Bobbitt, Shalon E, NP       nicotine  (NICODERM CQ  - dosed in mg/24 hours) patch 14 mg  14 mg Transdermal Daily Jadapalle, Sree, MD   14 mg at 01/28/24 0803   nicotine  polacrilex (NICORETTE ) gum 2 mg  2 mg Oral PRN Jadapalle, Sree, MD   2 mg at 01/27/24 1827   OLANZapine  (ZYPREXA ) tablet 5 mg  5 mg Oral QHS Cleotilde Hoy HERO, NP   5 mg at 01/27/24 2056   ondansetron  (ZOFRAN -ODT) disintegrating tablet 4 mg  4 mg Oral Q8H PRN Cleotilde Hoy HERO, NP   4 mg at 01/27/24 2055   phenylephrine -shark liver oil-mineral oil-petrolatum  (PREPARATION H) rectal ointment 1 Application  1 Application Rectal BID PRN Millington, Matthew E, PA-C   1 Application at 01/19/24 2257   traZODone  (DESYREL ) tablet 50 mg  50 mg Oral QHS PRN Bobbitt, Shalon E, NP   50 mg at 01/27/24 2054    Lab Results:  No results found for this or any previous visit (from the past 48 hours).    Blood Alcohol level:  Lab Results  Component Value Date   [facility] <15 01/14/2024   ETH <10 05/07/2019    Metabolic Disorder Labs: Lab Results  Component Value Date   HGBA1C 5.1 01/18/2024   MPG 99.67 01/18/2024   MPG 105.41 05/11/2019   Lab Results  Component Value Date   PROLACTIN 24.1 (H) 05/11/2019   Lab Results  Component Value Date   CHOL 154 01/18/2024   TRIG 180 (H) 01/18/2024   HDL 49 01/18/2024   CHOLHDL 3.1 01/18/2024   VLDL 36 01/18/2024   LDLCALC 69 01/18/2024   LDLCALC 82 05/11/2019      Psychiatric Specialty Exam: Appearance: Somewhat disheveled Eye Contact: Appropriate Speech: Clear and spontaneous Mood: Labile Affect: Tearful Thought Process: Linear yet tangential and ruminative Thought Content: Denies psychosis, SI, HI Insight: Limited Judgment: Impaired Attention: Intact with redirection Psychomotor: Wandering noted; hypermobility of right arm persists Orientation: Oriented x3 Memory: Limited, with poor recall of admission reason Concentration: Mildly impaired Recall: [employer] of Knowledge: Limited Language: Fluent    Sensorium  Memory:Immediate Fair; Remote Fair  Judgment: Fair  Insight:  Poor   Executive Functions  Concentration: Fair  Attention Span: Fair  Recall: Air cabin crew of Knowledge: Good  Language: Good  Musculoskeletal: Strength & Muscle Tone: within normal limits Gait & Station: normal    Physical Exam: Physical Exam Vitals and nursing note reviewed.  HENT:     Head:  Atraumatic.  Eyes:     Extraocular Movements: Extraocular movements intact.  Pulmonary:     Effort: Pulmonary effort is normal.  Neurological:     Mental Status: She is alert and oriented to person, place, and time.    Review of Systems  Psychiatric/Behavioral:  Positive for hallucinations.    Blood pressure (!) 124/95, pulse 69, temperature 97.8 F (36.6 C), resp. rate 20, height 5' 6 (1.676 m), weight 77.1 kg, SpO2 98%. Body mass index is 27.44 kg/m.   PLAN: Safety and Monitoring:  -- Voluntary admission to inpatient psychiatric unit for safety, stabilization and treatment  -- Daily contact with patient to assess and evaluate symptoms and progress in treatment  -- Patient's case to be discussed in multi-disciplinary team meeting  -- Observation Level : q15 minute checks  -- Vital signs:  q12 hours  -- Precautions: suicide, elopement, and assault -- Encouraged patient to participate in unit milieu and in scheduled group therapies   2. Psychiatric Diagnoses and Treatment:   Patient presentation initially appeared consistent with substance-induced mood disorder and psychosis. 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 Symptom profile suggests a chronic underlying psychotic process rather than a purely substance-induced syndrome. Differential remains between schizoaffective disorder vs. schizophrenia vs. bipolar disorder, and longitudinal data will help clarify.  Also notable are shifting somatic complaints and inconsistencies in recall, which suggest possible somatization or thought disorganization rather than clear medical etiology. Akathisia remains on the differential, but underlying psychiatric disorganization is primary concern.  Patient continues to require this acute inpatient psychiatric setting for safety,  medication management, and symptom stabilization to address need for continued treatment. Prolixin  has been added related to continued symptoms with plan to wean Zyprexa  in favor of typical antipsychotic in hopes of improved symptom control.   Psychiatric Diagnosis Schizoaffective Disorder vs. Schizophrenia (Primary Thought Disorder - under evaluation) Rule out: Bipolar I Disorder Substance-Induced Mood Disorder (initial consideration - now less likely) Substance-Induced Psychotic Disorder (initial consideration - now less likely) Rule out: Somatic Symptom Disorder  Psychiatric Medications Prolixin  5 mg PO TID Depakote  750 mg PO BID (Depakote  level on 7/20 was 39; repeat VPA and LFTs scheduled for 01/29/24 at 0500) Zyprexa  5 mg PO QHS x2 more nights, then discontinue Cogentin  2 mg PO Q8h PRN for akathisia   3. Medical Issues Being Addressed:  Patient reports right-sided jaw swelling and tooth pain. No fever noted. Patient is allergic to Amoxicillin. Case discussed with Dr. Jadapalle. Plan initiated. Less swelling and no complaint of pain today.   Start Azithromycin  500 mg PO today, then 250 mg PO daily for 4 days Add salt water  swish and spit Will monitor for progression or acute worsening; outpatient dental referral as needed  Patient also verbalized concern regarding kidney stone pain, citing a strong family history and her own past experiences. However, there were no acute pain episodes noted by staff, and UA was obtained out of caution. Notably, on the following day, patient did not recall these complaints and instead became fixated on family history of  Parkinson's disease in connection with the observed tremor in her right arm. Presentation raises concern for possible somatization, especially in the context of memory inconsistencies and shifting focus. 4. Discharge Planning:   -- Social work and case management to assist with discharge planning and identification of hospital follow-up  needs prior to discharge  -- Estimated LOS: 3-4 days  Hoy CHRISTELLA Pinal, NP 01/28/2024, 11:43 AM

## 2024-01-28 NOTE — Group Note (Signed)
 Recreation Therapy Group Note   Group Topic:Leisure Education  Group Date: 01/28/2024 Start Time: 1000 End Time: 1050 Facilitators: Celestia Jeoffrey BRAVO, LRT, CTRS Location: Courtyard  Group Description: Music. Patients encouraged to name their favorite song(s) for LRT to play song through speaker for group to hear, while in the courtyard getting fresh air and sunlight. Patients educated on the definition of leisure and the importance of having different leisure interests outside of the hospital. Group discussed how leisure activities can often be used as Pharmacologist and that listening to music and being outside are examples.    Goal Area(s) Addressed:  Patient will identify a current leisure interest.  Patient will practice making a positive decision. Patient will have the opportunity to try a new leisure activity.   Affect/Mood: Appropriate   Participation Level: Minimal    Clinical Observations/Individualized Feedback: Allison Bolton was present in group for 10 minutes.   Plan: Continue to engage patient in RT group sessions 2-3x/week.   [address], LRT, CTRS 01/28/2024 1:31 PM

## 2024-01-28 NOTE — Group Note (Signed)
 Date:  01/28/2024 Time:  10:11 AM  Group Topic/Focus:  Goals Group:   The focus of this group is to help patients establish daily goals to achieve during treatment and discuss how the patient can incorporate goal setting into their daily lives to aide in recovery.    Participation Level:  Did Not Attend   Allison Bolton 01/28/2024, 10:11 AM

## 2024-01-28 NOTE — Group Note (Signed)
 Date:  01/28/2024 Time:  8:35 PM  Group Topic/Focus:  Self Care:   The focus of this group is to help patients understand the importance of self-care in order to improve or restore emotional, physical, spiritual, interpersonal, and financial health.    Participation Level:  Active  Participation Quality:  Appropriate, Attentive, Sharing, and Supportive  Affect:  Appropriate  Cognitive:  Appropriate  Insight: Appropriate  Engagement in Group:  Engaged and Supportive  Modes of Intervention:  Discussion and Support  Additional Comments:     Kerri Katz 01/28/2024, 8:35 PM

## 2024-01-28 NOTE — Group Note (Signed)
 LCSW Group Therapy Note  Group Date: 01/28/2024 Start Time: 1310 End Time: 1400   Type of Therapy and Topic:  Group Therapy - How To Cope with Nervousness about Discharge   Participation Level:  Active   Description of Group This process group involved identification of patients' feelings about discharge. Some of them are scheduled to be discharged soon, while others are new admissions, but each of them was asked to share thoughts and feelings surrounding discharge from the hospital. One common theme was that they are excited at the prospect of going home, while another was that many of them are apprehensive about sharing why they were hospitalized. Patients were given the opportunity to discuss these feelings with their peers in preparation for discharge.  Therapeutic Goals  Patient will identify their overall feelings about pending discharge. Patient will think about how they might proactively address issues that they believe will once again arise once they get home (i.e. with parents). Patients will participate in discussion about having hope for change.   Summary of Patient Progress:   Patient was present in group.  Patient struggled with remaining seated and paced the room. This was consistent with previous group behavior with this clinician.  [place] wanted information on the process of discharging from the hospital when homeless.  CSW provided education on resources.  Patient left mid response.  Patient did not appear upset when she left, however did appear distracted.    Therapeutic Modalities Cognitive Behavioral Therapy   Sherryle JINNY Margo, LCSW 01/28/2024  3:05 PM

## 2024-01-28 NOTE — Progress Notes (Signed)
   01/28/24 1000  Psych Admission Type (Psych Patients Only)  Admission Status Voluntary  Psychosocial Assessment  Patient Complaints None  Eye Contact Fair  Facial Expression Animated;Anxious  Affect Anxious  Speech Unremarkable  Interaction Assertive  Motor Activity Restless  Appearance/Hygiene Disheveled  Behavior Characteristics Cooperative;Restless;Anxious  Mood Anxious;Pleasant  Thought Process  Coherency Loose associations  Content Hypochondria;Preoccupation  Delusions None reported or observed  Perception WDL  Hallucination None reported or observed  Judgment Impaired  Confusion Mild  Danger to Self  Current suicidal ideation? Denies  Danger to Others  Danger to Others None reported or observed

## 2024-01-28 NOTE — Group Note (Signed)
 Date:  01/28/2024 Time:  7:13 PM  Group Topic/Focus:  Activity Group: The focus of the group is to promote activity for the patients and encourage them to go outside to the courtyard and get some fresh air and some exercise.    Participation Level:  Active  Participation Quality:  Appropriate  Affect:  Appropriate  Cognitive:  Appropriate  Insight: Appropriate  Engagement in Group:  Engaged  Modes of Intervention:  Activity  Additional Comments:    Allison Bolton 01/28/2024, 7:13 PM

## 2024-01-28 NOTE — Plan of Care (Signed)
  Problem: Education: Goal: Emotional status will improve Outcome: Progressing Goal: Mental status will improve Outcome: Progressing Goal: Verbalization of understanding the information provided will improve Outcome: Progressing   Problem: Activity: Goal: Interest or engagement in activities will improve Outcome: Progressing   Problem: Coping: Goal: Ability to verbalize frustrations and anger appropriately will improve Outcome: Progressing   Problem: Health Behavior/Discharge Planning: Goal: Compliance with treatment plan for underlying cause of condition will improve Outcome: Progressing   Problem: Physical Regulation: Goal: Ability to maintain clinical measurements within normal limits will improve Outcome: Progressing   Problem: Safety: Goal: Periods of time without injury will increase Outcome: Progressing

## 2024-01-29 NOTE — Group Note (Signed)
 Date:  01/29/2024 Time:  10:38 AM  Group Topic/Focus:  Goals Group:   The focus of this group is to help patients establish daily goals to achieve during treatment and discuss how the patient can incorporate goal setting into their daily lives to aide in recovery.    Participation Level:  Minimal  Participation Quality:  Appropriate  Affect:  Appropriate  Cognitive:  Disorganized  Insight: Limited  Engagement in Group:  Poor  Modes of Intervention:  Activity and Discussion  Additional Comments:    Elenora Hawbaker L Kayde Warehime 01/29/2024, 10:38 AM

## 2024-01-29 NOTE — BH IP Treatment Plan (Signed)
 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[facility])  Secondary Diagnoses: Principal Problem:   Psychoactive substance-induced psychosis (HCC) Active Problems:   Bipolar disorder (HCC)   Substance induced mood disorder (HCC)   Current Medications:  Current Facility-Administered Medications  Medication Dose Route Frequency Provider Last Rate Last Admin   acetaminophen  (TYLENOL ) tablet 650 mg  650 mg Oral Q6H PRN Bobbitt, Shalon E, NP   650 mg at 01/29/24 0555   alum & mag hydroxide-simeth (MAALOX/MYLANTA) 200-200-20 MG/5ML suspension 30 mL  30 mL Oral Q4H PRN Bobbitt, Shalon E, NP   30 mL at 01/28/24 2243   azithromycin  (ZITHROMAX ) tablet 250 mg  250 mg Oral Daily Cleotilde Hoy HERO, NP   250 mg at 01/29/24 9191   benzocaine  (ORAJEL) 10 % mucosal gel   Mouth/Throat TID PRN Trudy Carwin, NP   Given at 01/24/24 9180   benztropine  (COGENTIN ) tablet 2 mg  2 mg Oral Q8H PRN Cleotilde Hoy HERO, NP   2 mg at 01/29/24 0809   haloperidol  (HALDOL ) tablet 5 mg  5 mg Oral TID PRN Bobbitt, Shalon E, NP   5 mg at 01/25/24 2059   And   diphenhydrAMINE  (BENADRYL ) capsule 50 mg  50 mg Oral TID PRN Bobbitt, Shalon E, NP   50 mg at 01/25/24 2059   haloperidol  lactate (HALDOL ) injection 5 mg  5 mg Intramuscular TID PRN Bobbitt, Shalon E, NP   5 mg at 01/24/24 1430   And   diphenhydrAMINE  (BENADRYL ) injection 50 mg  50 mg Intramuscular TID PRN Bobbitt, Shalon E, NP   50 mg at 01/24/24 1431   And   LORazepam  (ATIVAN ) injection 2 mg  2 mg Intramuscular TID PRN Bobbitt, Shalon E, NP   2 mg at 01/24/24 1431   haloperidol  lactate (HALDOL ) injection 10 mg  10 mg Intramuscular TID PRN Bobbitt, Shalon E, NP   10 mg at 01/20/24 1413   And   diphenhydrAMINE  (BENADRYL ) injection 50 mg  50 mg Intramuscular TID PRN Bobbitt, Shalon E, NP   50 mg at 01/20/24 1414   And    LORazepam  (ATIVAN ) injection 2 mg  2 mg Intramuscular TID PRN Bobbitt, Shalon E, NP   2 mg at 01/26/24 2054   divalproex  (DEPAKOTE ) DR tablet 750 mg  750 mg Oral Q12H Millington, Matthew E, PA-C   750 mg at 01/29/24 9191   fluPHENAZine  (PROLIXIN ) tablet 5 mg  5 mg Oral TID Cleotilde Hoy HERO, NP   5 mg at 01/29/24 9191   hydrOXYzine  (ATARAX ) tablet 25 mg  25 mg Oral TID PRN Bobbitt, Shalon E, NP   25 mg at 01/28/24 2040   magnesium  hydroxide (MILK OF MAGNESIA) suspension 30 mL  30 mL Oral Daily PRN Bobbitt, Shalon E, NP       nicotine  (NICODERM CQ  - dosed in mg/24 hours) patch 14 mg  14 mg Transdermal Daily Jadapalle, Sree, MD   14 mg at 01/29/24 9192   nicotine  polacrilex (NICORETTE ) gum 2 mg  2 mg Oral PRN Jadapalle, Sree, MD   2 mg at 01/28/24 1804   ondansetron  (ZOFRAN -ODT) disintegrating tablet 4 mg  4 mg Oral Q8H PRN Cleotilde Hoy HERO, NP   4 mg at 01/28/24 2040   phenylephrine -shark liver oil-mineral oil-petrolatum  (PREPARATION H) rectal ointment 1 Application  1 Application Rectal BID PRN Millington, Matthew E, PA-C   1 Application at 01/19/24  2257   traZODone  (DESYREL ) tablet 50 mg  50 mg Oral QHS PRN Bobbitt, Shalon E, NP   50 mg at 01/28/24 2040   PTA Medications: Medications Prior to Admission  Medication Sig Dispense Refill Last Dose/Taking   acetaminophen  (TYLENOL ) 325 MG tablet Take 650 mg by mouth every 6 (six) hours as needed.      albuterol  (VENTOLIN  HFA) 108 (90 Base) MCG/ACT inhaler Inhale 2 puffs into the lungs every 4 (four) hours as needed for wheezing or shortness of breath.      benztropine  (COGENTIN ) 1 MG tablet Take 1 tablet (1 mg total) by mouth 2 (two) times daily. 60 tablet 2    carvedilol  (COREG ) 25 MG tablet Take 1 tablet (25 mg total) by mouth 2 (two) times daily with a meal. 60 tablet 3    divalproex  (DEPAKOTE ) 250 MG DR tablet Take 3 tablets (750 mg total) by mouth every evening. 90 tablet 2    gabapentin  (NEURONTIN ) 100 MG capsule Take 100 mg by mouth 3  (three) times daily.      hydrOXYzine  (ATARAX ) 25 MG tablet Take 25 mg by mouth every 8 (eight) hours as needed for anxiety.      linaclotide  (LINZESS ) 72 MCG capsule Take 1 capsule (72 mcg total) by mouth daily before breakfast. 90 capsule 3    losartan (COZAAR) 25 MG tablet Take 25 mg by mouth daily.      OLANZapine  (ZYPREXA ) 10 MG tablet Take 10 mg by mouth at bedtime.      ondansetron  (ZOFRAN ) 4 MG tablet Take 4 mg by mouth every 6 (six) hours as needed.      ondansetron  (ZOFRAN -ODT) 4 MG disintegrating tablet Take 4 mg by mouth every 8 (eight) hours as needed.      QUEtiapine  (SEROQUEL ) 300 MG tablet Take 300 mg by mouth at bedtime.      tamsulosin (FLOMAX) 0.4 MG CAPS capsule Take 0.4 mg by mouth daily.      traZODone  (DESYREL ) 100 MG tablet Take 2 tablets (200 mg total) by mouth at bedtime as needed for sleep. 60 tablet 2     Patient Stressors: Financial difficulties   Health problems   Occupational concerns   Substance abuse   Traumatic event    Patient Strengths: Motivation for treatment/growth  Supportive family/friends   Treatment Modalities: Medication Management, Group therapy, Case management,  1 to 1 session with clinician, Psychoeducation, Recreational therapy.   Physician Treatment Plan for Primary Diagnosis: Psychoactive substance-induced psychosis (HCC) Long Term Goal(s): Improvement in symptoms so as ready for discharge   Short Term Goals: Ability to identify changes in lifestyle to reduce recurrence of condition will improve  Medication Management: Evaluate patient's response, side effects, and tolerance of medication regimen.  Therapeutic Interventions: 1 to 1 sessions, Unit Group sessions and Medication administration.  Evaluation of Outcomes: Progressing  Physician Treatment Plan for Secondary Diagnosis: Principal Problem:   Psychoactive substance-induced psychosis (HCC) Active Problems:   Bipolar disorder (HCC)   Substance induced mood disorder  (HCC)  Long Term Goal(s): Improvement in symptoms so as ready for discharge   Short Term Goals: Ability to identify changes in lifestyle to reduce recurrence of condition will improve     Medication Management: Evaluate patient's response, side effects, and tolerance of medication regimen.  Therapeutic Interventions: 1 to 1 sessions, Unit Group sessions and Medication administration.  Evaluation of Outcomes: Progressing   RN Treatment Plan for Primary Diagnosis: Psychoactive substance-induced psychosis (HCC) Long Term Goal(s): Knowledge of  disease and therapeutic regimen to maintain health will improve  Short Term Goals: Ability to remain free from injury will improve, Ability to verbalize frustration and anger appropriately will improve, Ability to demonstrate self-control, Ability to participate in decision making will improve, Ability to verbalize feelings will improve, Ability to disclose and discuss suicidal ideas, Ability to identify and develop effective coping behaviors will improve, and Compliance with prescribed medications will improve  Medication Management: RN will administer medications as ordered by provider, will assess and evaluate patient's response and provide education to patient for prescribed medication. RN will report any adverse and/or side effects to prescribing provider.  Therapeutic Interventions: 1 on 1 counseling sessions, Psychoeducation, Medication administration, Evaluate responses to treatment, Monitor vital signs and CBGs as ordered, Perform/monitor CIWA, COWS, AIMS and Fall Risk screenings as ordered, Perform wound care treatments as ordered.  Evaluation of Outcomes: Progressing   LCSW Treatment Plan for Primary Diagnosis: Psychoactive substance-induced psychosis (HCC) Long Term Goal(s): Safe transition to appropriate next level of care at discharge, Engage patient in therapeutic group addressing interpersonal concerns.  Short Term Goals: Engage patient  in aftercare planning with referrals and resources, Increase social support, Increase ability to appropriately verbalize feelings, Increase emotional regulation, Facilitate acceptance of mental health diagnosis and concerns, Facilitate patient progression through stages of change regarding substance use diagnoses and concerns, Identify triggers associated with mental health/substance abuse issues, and Increase skills for wellness and recovery  Therapeutic Interventions: Assess for all discharge needs, 1 to 1 time with Social worker, Explore available resources and support systems, Assess for adequacy in community support network, Educate family and significant other(s) on suicide prevention, Complete Psychosocial Assessment, Interpersonal group therapy.  Evaluation of Outcomes: Progressing   Progress in Treatment: Attending groups: Yes. Participating in groups: Yes. Taking medication as prescribed: Yes. Toleration medication: Yes. Family/Significant other contact made: Yes, individual(s) contacted:  contact attempts have been made.  Patient understands diagnosis: Yes. Discussing patient identified problems/goals with staff: Yes. Medical problems stabilized or resolved: Yes. Denies suicidal/homicidal ideation: Yes. Issues/concerns per patient self-inventory: No. Other: none.  New problem(s) identified: No, Describe:  none identified. Update 01/23/24: No changes at this time. Update 01/28/24: No changes at this time.   New Short Term/Long Term Goal(s): detox, elimination of symptoms of psychosis, medication management for mood stabilization; elimination of SI thoughts; development of comprehensive mental wellness/sobriety plan.   Update 01/23/24: No changes at this time. Update 01/28/24: No changes at this time.    Patient Goals: Work with everybody.    Update 01/23/24: No changes at this time. Update 01/28/24: No changes at this time.   Discharge Plan or Barriers: CSW will assist pt with  development of an appropriate aftercare/discharge plan.   Update 01/23/24: No changes at this time. Update 01/28/24: Contacts have been made to family without result.    Reason for Continuation of Hospitalization: Medication stabilization Other; describe disorganized thought processes, and bizarre behavior.    Estimated Length of Stay: 1-7 days   Update 01/23/24: TBD Update 01/28/24: TBD  Last 3 [place] Suicide Severity Risk Score: Flowsheet Row Admission (Current) from 01/15/2024 in [facility]PAT[facility] ED from 01/14/2024 in [facility] Emergency Department at [facility] Admission (Discharged) from 12/25/2023 in [facility]  C-SSRS RISK CATEGORY No Risk No Risk No Risk    Last PHQ 2/9 Scores:     No data to display          Scribe for Treatment Team: [name], LCSW  01/29/2024 9:09 AM

## 2024-01-29 NOTE — Progress Notes (Signed)
 [facility] MD Progress Note  01/29/2024 8:37 AM Allison Bolton  MRN:  [id number]  53 year old female with medical history of asthma, fibromyalgia, diabetes mellitus, ovarian and cervical cancer, possible colon cancer, and Mnire's disease, as well as psychiatric history of bipolar disorder and polysubstance abuse, presented to the emergency department with bizarre and chaotic behavior, gross disorganization, and positive urine drug screen for benzodiazepines, amphetamines, and THC. She was administered Geodon  and Ativan  in the ED.   Subjective:  Chart reviewed, case discussed in multidisciplinary meeting, patient seen during rounds.  Patient seen today for follow-up psychiatric evaluation. She is requesting to go home but demonstrates limited insight into her mental health condition. She appears anxious, tearful, and displays a childlike demeanor throughout the interaction. Patient is unable to clearly state where she was living prior to admission, alternating between reports of [place], [place] and [place], [place]. She has been writing down names of family members she claims to be in contact with, including a daughter and grandchild, but all numbers provided have been unreachable despite staff efforts.  She reports no side effects from current medications. Denies suicidal ideation (SI), homicidal ideation (HI), hallucinations, paranoia, or delusions. She is eating and sleeping appropriately.  Patient remains disorganized and confused, yet is redirectable and able to follow simple conversations with structure and support. Affect is tearful but not labile. Thought content remains vague and lacking coherence, and there are no behavioral disturbances noted today. She has not exhibited aggression, anger, or self-harm behaviors. While cooperative, she shows poor orientation to living arrangements and limited grasp of her treatment needs. Insight remains minimal.   Patient continues to meet criteria for severe mental  illness with cognitive and functional impairment, likely consistent with a chronic psychotic or mood disorder complicated by disorganization, confusion, and impaired insight. She remains unable to articulate a clear living situation and has not been able to provide valid collateral contacts. Her disorganized thinking, lack of insight, and functional dependency raise concerns regarding her ability to safely care for herself in the community without structured support.  Per chart review, treatment and diagnostic clarity are complicated by social determinants of health, including a history of substance abuse, homelessness, and inadequate housing. These factors continue to impair her ability to access, adhere to, and benefit from care, and significantly influence her medical decision-making capacity.  Patient noted wandering the halls and requiring redirection. Continues to display right arm tremor; again attributes this to "all my family has Parkinson's." No episodes of self-harm or aggression have been noted. Mood remains labile with tearfulness and perseveration around discharge and family.  Sleep: good   Appetite:  Fair  Past Psychiatric History: see h&P Family History:  Family History  Problem Relation Age of Onset   Thyroid  disease Mother    Colon cancer Father    Breast cancer Maternal Aunt    Colon cancer Brother    Colon cancer Other        paternal and maternal grandfather   Meniere's disease Other    Anesthesia problems Neg Hx    Hypotension Neg Hx    Malignant hyperthermia Neg Hx    Pseudochol deficiency Neg Hx    Social History:  Social History   Substance and Sexual Activity  Alcohol Use No   Comment: not since June 2012     Social History   Substance and Sexual Activity  Drug Use No   Comment: patient denies any-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    Social  History   Socioeconomic History   Marital status: Legally Separated    Spouse name: Not on file    Number of children: Not on file   Years of education: Not on file   Highest education level: Not on file  Occupational History   Not on file  Tobacco Use   Smoking status: Every Day    Current packs/day: 0.00    Average packs/day: 0.5 packs/day for 37.3 years (18.7 ttl pk-yrs)    Types: Cigarettes    Start date: 07/07/1981    Last attempt to quit: 11/05/2018    Years since quitting: 5.2   Smokeless tobacco: Never  Vaping Use   Vaping status: Never Used  Substance and Sexual Activity   Alcohol use: No    Comment: not since June 2012   Drug use: No    Comment: patient denies any-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   Sexual activity: Never    Birth control/protection: Surgical  Other Topics Concern   Not on file  Social History Narrative   Not on file   Social Drivers of Health   Financial Resource Strain: Not on file  Food Insecurity: Food Insecurity Present (01/15/2024)   Hunger Vital Sign    Worried About Running Out of Food in the Last Year: Often true    Ran Out of Food in the Last Year: Often true  Transportation Needs: Unmet Transportation Needs (01/15/2024)   PRAPARE - Administrator, Civil Service (Medical): Yes    Lack of Transportation (Non-Medical): Yes  Physical Activity: Not on file  Stress: Not on file  Social Connections: Unknown (10/14/2021)   Received from [facility]   Social Connections    Do your friends and family support you?: Not on file    What agencies support you?: Not on file   Past Medical History:  Past Medical History:  Diagnosis Date   Arthritis    Asthma    Bipolar 1 disorder (HCC)    Diverticulosis    Dysrhythmia    sts I have heart palpitations   Fibromyalgia    H/O hiatal hernia    4   Headache(784.0)    High blood pressure    Meniere's disease    S/P colonoscopy    Dr. Golda 2010: few small diverticula at sigmoid. otherwise normal.    Shortness of breath     Past Surgical History:   Procedure Laterality Date   ABDOMINAL HYSTERECTOMY     APPENDECTOMY     CESAREAN SECTION     CHOLECYSTECTOMY     COLONOSCOPY N/A 12/25/2023   Procedure: COLONOSCOPY;  Surgeon: Eartha Angelia Sieving, MD;  Location: [facility];  Service: Gastroenterology;  Laterality: N/A;  11:30am, asa 1   exploratory laparoscopy     FOOT SURGERY     HEMORRHOID SURGERY     LAPAROSCOPIC APPENDECTOMY  02/19/2011   Procedure: APPENDECTOMY LAPAROSCOPIC;  Surgeon: Oneil DELENA Budge;  Location: [facility];  Service: General;  Laterality: N/A;   LAPAROSCOPY  02/19/2011   Procedure: LAPAROSCOPY DIAGNOSTIC;  Surgeon: Oneil DELENA Budge;  Location: [facility];  Service: General;  Laterality: N/A;   left ovarian removal     multiple hernia repairs     Right ovarian removal     TONSILLECTOMY     TONSILLECTOMY AND ADENOIDECTOMY     tubes in ears     UMBILICAL HERNIA REPAIR  Dec 2011   Dr. Budge  wisdom teeth removal      Current Medications: Current Facility-Administered Medications  Medication Dose Route Frequency Provider Last Rate Last Admin   acetaminophen  (TYLENOL ) tablet 650 mg  650 mg Oral Q6H PRN Bobbitt, Shalon E, NP   650 mg at 01/29/24 0555   alum & mag hydroxide-simeth (MAALOX/MYLANTA) 200-200-20 MG/5ML suspension 30 mL  30 mL Oral Q4H PRN Bobbitt, Shalon E, NP   30 mL at 01/28/24 2243   azithromycin  (ZITHROMAX ) tablet 250 mg  250 mg Oral Daily Cleotilde Hoy HERO, NP   250 mg at 01/29/24 9191   benzocaine  (ORAJEL) 10 % mucosal gel   Mouth/Throat TID PRN Trudy Carwin, NP   Given at 01/24/24 9180   benztropine  (COGENTIN ) tablet 2 mg  2 mg Oral Q8H PRN Cleotilde Hoy HERO, NP   2 mg at 01/29/24 0809   haloperidol  (HALDOL ) tablet 5 mg  5 mg Oral TID PRN Bobbitt, Shalon E, NP   5 mg at 01/25/24 2059   And   diphenhydrAMINE  (BENADRYL ) capsule 50 mg  50 mg Oral TID PRN Bobbitt, Shalon E, NP   50 mg at 01/25/24 2059   haloperidol  lactate (HALDOL ) injection 5 mg  5 mg Intramuscular TID PRN Bobbitt, Shalon E, NP    5 mg at 01/24/24 1430   And   diphenhydrAMINE  (BENADRYL ) injection 50 mg  50 mg Intramuscular TID PRN Bobbitt, Shalon E, NP   50 mg at 01/24/24 1431   And   LORazepam  (ATIVAN ) injection 2 mg  2 mg Intramuscular TID PRN Bobbitt, Shalon E, NP   2 mg at 01/24/24 1431   haloperidol  lactate (HALDOL ) injection 10 mg  10 mg Intramuscular TID PRN Bobbitt, Shalon E, NP   10 mg at 01/20/24 1413   And   diphenhydrAMINE  (BENADRYL ) injection 50 mg  50 mg Intramuscular TID PRN Bobbitt, Shalon E, NP   50 mg at 01/20/24 1414   And   LORazepam  (ATIVAN ) injection 2 mg  2 mg Intramuscular TID PRN Bobbitt, Shalon E, NP   2 mg at 01/26/24 2054   divalproex  (DEPAKOTE ) DR tablet 750 mg  750 mg Oral Q12H Millington, Matthew E, PA-C   750 mg at 01/29/24 9191   fluPHENAZine  (PROLIXIN ) tablet 5 mg  5 mg Oral TID Cleotilde Hoy HERO, NP   5 mg at 01/29/24 9191   hydrOXYzine  (ATARAX ) tablet 25 mg  25 mg Oral TID PRN Bobbitt, Shalon E, NP   25 mg at 01/28/24 2040   magnesium  hydroxide (MILK OF MAGNESIA) suspension 30 mL  30 mL Oral Daily PRN Bobbitt, Shalon E, NP       nicotine  (NICODERM CQ  - dosed in mg/24 hours) patch 14 mg  14 mg Transdermal Daily Jadapalle, Sree, MD   14 mg at 01/29/24 9192   nicotine  polacrilex (NICORETTE ) gum 2 mg  2 mg Oral PRN Jadapalle, Sree, MD   2 mg at 01/28/24 1804   ondansetron  (ZOFRAN -ODT) disintegrating tablet 4 mg  4 mg Oral Q8H PRN Cleotilde Hoy HERO, NP   4 mg at 01/28/24 2040   phenylephrine -shark liver oil-mineral oil-petrolatum  (PREPARATION H) rectal ointment 1 Application  1 Application Rectal BID PRN Millington, Matthew E, PA-C   1 Application at 01/19/24 2257   traZODone  (DESYREL ) tablet 50 mg  50 mg Oral QHS PRN Bobbitt, Shalon E, NP   50 mg at 01/28/24 2040    Lab Results:  No results found for this or any previous visit (from the past 48 hours).  Blood Alcohol level:  Lab Results  Component Value Date   [facility] <15 01/14/2024   ETH <10 05/07/2019    Metabolic Disorder  Labs: Lab Results  Component Value Date   HGBA1C 5.1 01/18/2024   MPG 99.67 01/18/2024   MPG 105.41 05/11/2019   Lab Results  Component Value Date   PROLACTIN 24.1 (H) 05/11/2019   Lab Results  Component Value Date   CHOL 154 01/18/2024   TRIG 180 (H) 01/18/2024   HDL 49 01/18/2024   CHOLHDL 3.1 01/18/2024   VLDL 36 01/18/2024   LDLCALC 69 01/18/2024   LDLCALC 82 05/11/2019      Psychiatric Specialty Exam: Appearance: Somewhat disheveled Eye Contact: Appropriate Speech: Clear and spontaneous Mood: Labile Affect: Tearful Thought Process: Linear yet tangential and ruminative Thought Content: Denies psychosis, SI, HI Insight: Limited Judgment: Impaired Attention: Intact with redirection Psychomotor: Wandering noted; hypermobility of right arm persists Orientation: Oriented x3 Memory: Limited, with poor recall of admission reason Concentration: Mildly impaired Recall: [employer] of Knowledge: Limited Language: Fluent    Sensorium  Memory:Immediate Fair; Remote Fair  Judgment: Fair  Insight: Poor   Musculoskeletal: Strength & Muscle Tone: within normal limits Gait & Station: normal    Physical Exam: Physical Exam Vitals and nursing note reviewed.  HENT:     Head: Atraumatic.  Eyes:     Extraocular Movements: Extraocular movements intact.  Pulmonary:     Effort: Pulmonary effort is normal.  Neurological:     Mental Status: She is alert and oriented to person, place, and time.    Review of Systems  Psychiatric/Behavioral:  Positive for hallucinations.    Blood pressure 109/72, pulse 61, temperature (!) 97.3 F (36.3 C), resp. rate 18, height 5' 6 (1.676 m), weight 77.1 kg, SpO2 100%. Body mass index is 27.44 kg/m.   PLAN: Safety and Monitoring:  -- Voluntary admission to inpatient psychiatric unit for safety, stabilization and treatment  -- Daily contact with patient to assess and evaluate symptoms and progress in treatment  -- Patient's  case to be discussed in multi-disciplinary team meeting  -- Observation Level : q15 minute checks  -- Vital signs:  q12 hours  -- Precautions: suicide, elopement, and assault -- Encouraged patient to participate in unit milieu and in scheduled group therapies   2. Psychiatric Diagnoses and Treatment:   Patient presentation initially appeared consistent with substance-induced mood disorder and psychosis. 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 Symptom profile suggests a chronic underlying psychotic process rather than a purely substance-induced syndrome. Differential remains between schizoaffective disorder vs. schizophrenia vs. bipolar disorder, and longitudinal data will help clarify.  Also notable are shifting somatic complaints and inconsistencies in recall, which suggest possible somatization or thought disorganization rather than clear medical etiology. Akathisia remains on the differential, but underlying psychiatric disorganization is primary concern.  Patient continues to require this acute inpatient psychiatric setting for safety, medication management, and symptom stabilization to address need for continued treatment. Prolixin  has been added related to continued symptoms with plan to wean Zyprexa  in favor of typical antipsychotic in hopes of improved symptom control. VPA ordered for AM, once dose titrated to therapeutic dose will begin discharge planning as, while impaired, presentation no longer acute and may be representative of patient's baseline.   Psychiatric Diagnosis Schizoaffective Disorder vs. Schizophrenia (Primary Thought Disorder - under evaluation) Rule out: Bipolar  I Disorder Substance-Induced Mood Disorder (initial consideration - now less likely) Substance-Induced Psychotic Disorder (initial consideration  - now less likely) Rule out: Somatic Symptom Disorder  Psychiatric Medications Prolixin  5 mg PO TID Depakote  750 mg PO BID (Depakote  level on 7/20 was 39; repeat VPA and LFTs scheduled for 01/30/24 at 0500) Zyprexa  5 mg PO QHS x2 more nights, then discontinue Cogentin  2 mg PO Q8h PRN for akathisia   3. Medical Issues Being Addressed:  Patient reports right-sided jaw swelling and tooth pain. No fever noted. Patient is allergic to Amoxicillin. Case discussed with Dr. Jadapalle. Plan initiated. Less swelling and no complaint of pain today.   Start Azithromycin  500 mg PO today, then 250 mg PO daily for 4 days Add salt water  swish and spit Will monitor for progression or acute worsening; outpatient dental referral as needed  Patient also verbalized concern regarding kidney stone pain, citing a strong family history and her own past experiences. However, there were no acute pain episodes noted by staff, and UA was obtained out of caution. Notably, on the following day, patient did not recall these complaints and instead became fixated on family history of Parkinson's disease in connection with the observed tremor in her right arm. Presentation raises concern for possible somatization, especially in the context of memory inconsistencies and shifting focus. 4. Discharge Planning:   -- Social work and case management to assist with discharge planning and identification of hospital follow-up needs prior to discharge  -- Estimated LOS: 3-4 days  Hoy CHRISTELLA Pinal, NP 01/29/2024, 8:37 AM

## 2024-01-29 NOTE — Progress Notes (Signed)
   01/29/24 1600  Psych Admission Type (Psych Patients Only)  Admission Status Voluntary  Psychosocial Assessment  Patient Complaints None  Eye Contact Fair  Facial Expression Animated  Affect Anxious  Speech Unremarkable  Interaction Assertive  Motor Activity Restless;Tremors  Appearance/Hygiene Disheveled  Behavior Characteristics Cooperative;Anxious;Restless  Mood Anxious;Pleasant  Thought Process  Coherency Loose associations  Content Preoccupation  Delusions None reported or observed  Perception WDL  Hallucination None reported or observed  Judgment Impaired  Confusion Mild  Danger to Self  Current suicidal ideation? Denies  Danger to Others  Danger to Others None reported or observed

## 2024-01-29 NOTE — [facility] Counselor (Signed)
 CSW attempted contact with daughter, Allison Bolton [phone]). Contact was unable to be established. Call went directly to voicemail and mailbox was full so message was unable to be left.   CSW attempted contact with aunt, Allison Bolton [phone]). Contact was unable to be established but HIPAA compliant voicemail was left with contact information for follow through. Bolton and CSW have missed each other. Note left for weekend CSW to attempt contact.   Allison Bolton. Chaim, MSW, LCSW, LCAS 01/29/2024 3:53 PM

## 2024-01-29 NOTE — Plan of Care (Signed)
  Problem: Education: Goal: Emotional status will improve Outcome: Progressing Goal: Mental status will improve Outcome: Progressing   Problem: Activity: Goal: Interest or engagement in activities will improve Outcome: Progressing Goal: Sleeping patterns will improve Outcome: Not Progressing   Problem: Health Behavior/Discharge Planning: Goal: Compliance with treatment plan for underlying cause of condition will improve Outcome: Progressing   Problem: Physical Regulation: Goal: Ability to maintain clinical measurements within normal limits will improve Outcome: Progressing   Problem: Safety: Goal: Periods of time without injury will increase Outcome: Progressing

## 2024-01-30 LAB — HEPATIC FUNCTION PANEL
ALT: 19 U/L (ref 0–44)
AST: 16 U/L (ref 15–41)
Albumin: 3.6 g/dL (ref 3.5–5.0)
Alkaline Phosphatase: 54 U/L (ref 38–126)
Bilirubin, Direct: <0.1 mg/dL (ref 0.0–0.2)
Total Bilirubin: 0.7 mg/dL (ref 0.0–1.2)
Total Protein: 6.8 g/dL (ref 6.5–8.1)

## 2024-01-30 LAB — VALPROIC ACID LEVEL: Valproic Acid Lvl: 64 ug/mL (ref 50–100)

## 2024-01-30 NOTE — Progress Notes (Signed)
 [facility] MD Progress Note  01/30/2024 12:29 PM Allison Bolton  MRN:  [id number]  53 year old female with medical history of asthma, fibromyalgia, diabetes mellitus, ovarian and cervical cancer, possible colon cancer, and Mnire's disease, as well as psychiatric history of bipolar disorder and polysubstance abuse, presented to the emergency department with bizarre and chaotic behavior, gross disorganization, and positive urine drug screen for benzodiazepines, amphetamines, and THC. She was administered Geodon  and Ativan  in the ED.   Subjective:  Chart reviewed, case discussed in multidisciplinary meeting, patient seen during rounds.  Patient reports feeling "good" and denies depression, anxiety, auditory or visual hallucinations (AVH), suicidal ideation (SI), and homicidal ideation (HI). She denies medication side effects. Sleep and appetite appear stable. Patient continues to focus on discharge and family connections, though contact numbers remain unreachable. She is less tearful and more cheerful today, smiling and responding with minimal distress.  While she presents brighter and more emotionally regulated today, her thought content remains vague and her orientation to living arrangements is poor. There are no safety concerns or acute psychotic symptoms noted today. Functional impairment persists, requiring structured support to maintain safety and stability. Insight and judgment remain significantly limited.  Patient remains appropriate for continued inpatient psychiatric care. No changes to current medications at this time given apparent stabilization. Continued monitoring of cognitive status, mood, and orientation. Will coordinate with social work for discharge planning and evaluate need for supervised placement if no valid collateral supports are confirmed.   Sleep: good   Appetite:  Fair  Past Psychiatric History: see h&P Family History:  Family History  Problem Relation Age of Onset    Thyroid  disease Mother    Colon cancer Father    Breast cancer Maternal Aunt    Colon cancer Brother    Colon cancer Other        paternal and maternal grandfather   Meniere's disease Other    Anesthesia problems Neg Hx    Hypotension Neg Hx    Malignant hyperthermia Neg Hx    Pseudochol deficiency Neg Hx    Social History:  Social History   Substance and Sexual Activity  Alcohol Use No   Comment: not since June 2012     Social History   Substance and Sexual Activity  Drug Use No   Comment: patient denies any-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    Social History   Socioeconomic History   Marital status: Legally Separated    Spouse name: Not on file   Number of children: Not on file   Years of education: Not on file   Highest education level: Not on file  Occupational History   Not on file  Tobacco Use   Smoking status: Every Day    Current packs/day: 0.00    Average packs/day: 0.5 packs/day for 37.3 years (18.7 ttl pk-yrs)    Types: Cigarettes    Start date: 07/07/1981    Last attempt to quit: 11/05/2018    Years since quitting: 5.2   Smokeless tobacco: Never  Vaping Use   Vaping status: Never Used  Substance and Sexual Activity   Alcohol use: No    Comment: not since June 2012   Drug use: No    Comment: patient denies any-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   Sexual activity: Never    Birth control/protection: Surgical  Other Topics Concern   Not on file  Social History Narrative   Not on file   Social  Drivers of Corporate investment banker Strain: Not on file  Food Insecurity: Food Insecurity Present (01/15/2024)   Hunger Vital Sign    Worried About Running Out of Food in the Last Year: Often true    Ran Out of Food in the Last Year: Often true  Transportation Needs: Unmet Transportation Needs (01/15/2024)   PRAPARE - Administrator, Civil Service (Medical): Yes    Lack of Transportation (Non-Medical): Yes  Physical Activity: Not  on file  Stress: Not on file  Social Connections: Unknown (10/14/2021)   Received from [facility]   Social Connections    Do your friends and family support you?: Not on file    What agencies support you?: Not on file   Past Medical History:  Past Medical History:  Diagnosis Date   Arthritis    Asthma    Bipolar 1 disorder (HCC)    Diverticulosis    Dysrhythmia    sts I have heart palpitations   Fibromyalgia    H/O hiatal hernia    4   Headache(784.0)    High blood pressure    Meniere's disease    S/P colonoscopy    Dr. Golda 2010: few small diverticula at sigmoid. otherwise normal.    Shortness of breath     Past Surgical History:  Procedure Laterality Date   ABDOMINAL HYSTERECTOMY     APPENDECTOMY     CESAREAN SECTION     CHOLECYSTECTOMY     COLONOSCOPY N/A 12/25/2023   Procedure: COLONOSCOPY;  Surgeon: Eartha Angelia Sieving, MD;  Location: [facility];  Service: Gastroenterology;  Laterality: N/A;  11:30am, asa 1   exploratory laparoscopy     FOOT SURGERY     HEMORRHOID SURGERY     LAPAROSCOPIC APPENDECTOMY  02/19/2011   Procedure: APPENDECTOMY LAPAROSCOPIC;  Surgeon: Oneil DELENA Budge;  Location: [facility];  Service: General;  Laterality: N/A;   LAPAROSCOPY  02/19/2011   Procedure: LAPAROSCOPY DIAGNOSTIC;  Surgeon: Oneil DELENA Budge;  Location: [facility];  Service: General;  Laterality: N/A;   left ovarian removal     multiple hernia repairs     Right ovarian removal     TONSILLECTOMY     TONSILLECTOMY AND ADENOIDECTOMY     tubes in ears     UMBILICAL HERNIA REPAIR  Dec 2011   Dr. Budge   wisdom teeth removal      Current Medications: Current Facility-Administered Medications  Medication Dose Route Frequency Provider Last Rate Last Admin   acetaminophen  (TYLENOL ) tablet 650 mg  650 mg Oral Q6H PRN Bobbitt, Shalon E, NP   650 mg at 01/30/24 0825   alum & mag hydroxide-simeth (MAALOX/MYLANTA) 200-200-20 MG/5ML suspension 30 mL  30 mL Oral Q4H  PRN Bobbitt, Shalon E, NP   30 mL at 01/29/24 2143   azithromycin  (ZITHROMAX ) tablet 250 mg  250 mg Oral Daily Cleotilde Hoy HERO, NP   250 mg at 01/30/24 0825   benzocaine  (ORAJEL) 10 % mucosal gel   Mouth/Throat TID PRN Trudy Carwin, NP   Given at 01/30/24 1219   benztropine  (COGENTIN ) tablet 2 mg  2 mg Oral Q8H PRN Cleotilde Hoy HERO, NP   2 mg at 01/29/24 1657   haloperidol  (HALDOL ) tablet 5 mg  5 mg Oral TID PRN Bobbitt, Shalon E, NP   5 mg at 01/25/24 2059   And   diphenhydrAMINE  (BENADRYL ) capsule 50 mg  50 mg Oral TID PRN Bobbitt, Shalon E, NP  50 mg at 01/25/24 2059   haloperidol  lactate (HALDOL ) injection 5 mg  5 mg Intramuscular TID PRN Bobbitt, Shalon E, NP   5 mg at 01/24/24 1430   And   diphenhydrAMINE  (BENADRYL ) injection 50 mg  50 mg Intramuscular TID PRN Bobbitt, Shalon E, NP   50 mg at 01/24/24 1431   And   LORazepam  (ATIVAN ) injection 2 mg  2 mg Intramuscular TID PRN Bobbitt, Shalon E, NP   2 mg at 01/24/24 1431   haloperidol  lactate (HALDOL ) injection 10 mg  10 mg Intramuscular TID PRN Bobbitt, Shalon E, NP   10 mg at 01/20/24 1413   And   diphenhydrAMINE  (BENADRYL ) injection 50 mg  50 mg Intramuscular TID PRN Bobbitt, Shalon E, NP   50 mg at 01/20/24 1414   And   LORazepam  (ATIVAN ) injection 2 mg  2 mg Intramuscular TID PRN Bobbitt, Shalon E, NP   2 mg at 01/26/24 2054   divalproex  (DEPAKOTE ) DR tablet 750 mg  750 mg Oral Q12H Millington, Matthew E, PA-C   750 mg at 01/30/24 0825   fluPHENAZine  (PROLIXIN ) tablet 5 mg  5 mg Oral TID Cleotilde Hoy HERO, NP   5 mg at 01/30/24 1219   hydrOXYzine  (ATARAX ) tablet 25 mg  25 mg Oral TID PRN Bobbitt, Shalon E, NP   25 mg at 01/29/24 2358   magnesium  hydroxide (MILK OF MAGNESIA) suspension 30 mL  30 mL Oral Daily PRN Bobbitt, Shalon E, NP       nicotine  (NICODERM CQ  - dosed in mg/24 hours) patch 14 mg  14 mg Transdermal Daily Jadapalle, Sree, MD   14 mg at 01/30/24 9173   nicotine  polacrilex (NICORETTE ) gum 2 mg  2 mg Oral PRN  Jadapalle, Sree, MD   2 mg at 01/29/24 1743   ondansetron  (ZOFRAN -ODT) disintegrating tablet 4 mg  4 mg Oral Q8H PRN Cleotilde Hoy HERO, NP   4 mg at 01/29/24 2125   phenylephrine -shark liver oil-mineral oil-petrolatum  (PREPARATION H) rectal ointment 1 Application  1 Application Rectal BID PRN Millington, Matthew E, PA-C   1 Application at 01/19/24 2257   traZODone  (DESYREL ) tablet 50 mg  50 mg Oral QHS PRN Bobbitt, Shalon E, NP   50 mg at 01/29/24 2143    Lab Results:  Results for orders placed or performed during the hospital encounter of 01/15/24 (from the past 48 hours)  Hepatic function panel     Status: None   Collection Time: 01/30/24  8:21 AM  Result Value Ref Range   Total Protein 6.8 6.5 - 8.1 g/dL   Albumin 3.6 3.5 - 5.0 g/dL   AST 16 15 - 41 U/L   ALT 19 0 - 44 U/L   Alkaline Phosphatase 54 38 - 126 U/L   Total Bilirubin 0.7 0.0 - 1.2 mg/dL   Bilirubin, Direct <9.8 0.0 - 0.2 mg/dL   Indirect Bilirubin NOT CALCULATED 0.3 - 0.9 mg/dL    Comment: Performed at [facility], [address] [place], [place] [address]  Valproic  acid level     Status: None   Collection Time: 01/30/24  8:21 AM  Result Value Ref Range   Valproic  Acid Lvl 64 50 - 100 ug/mL    Comment: Performed at [facility], [address] [place], [place] [address]      Blood Alcohol level:  Lab Results  Component Value Date   [facility] <15 01/14/2024   ETH <10 05/07/2019    Metabolic Disorder Labs:  Lab Results  Component Value Date   HGBA1C 5.1 01/18/2024   MPG 99.67 01/18/2024   MPG 105.41 05/11/2019   Lab Results  Component Value Date   PROLACTIN 24.1 (H) 05/11/2019   Lab Results  Component Value Date   CHOL 154 01/18/2024   TRIG 180 (H) 01/18/2024   HDL 49 01/18/2024   CHOLHDL 3.1 01/18/2024   VLDL 36 01/18/2024   LDLCALC 69 01/18/2024   LDLCALC 82 05/11/2019      Psychiatric Specialty Exam: Appearance: Disheveled, cooperative Behavior: Seen walking in hall, no  tremor noted today Mood: Euthymic Affect: Bright, less labile than prior days Speech: Normal rate and tone Thought Process: Tangential, vague Thought Content: No delusions, hallucinations, SI, or HI Insight: Poor Judgment: Impaired Eye Contact: Adequate Attention: Intact with redirection Psychomotor Activity: Normal Orientation: Limited (continues to report conflicting information regarding residence) Sleep: WNL Appetite: WNL Memory, Concentration, Recall, Fund of Knowledge, Language: Mildly impaired or inconsistent   Musculoskeletal: Strength & Muscle Tone: within normal limits Gait & Station: normal    Physical Exam: Physical Exam Vitals and nursing note reviewed.  HENT:     Head: Atraumatic.  Eyes:     Extraocular Movements: Extraocular movements intact.  Pulmonary:     Effort: Pulmonary effort is normal.  Neurological:     Mental Status: She is alert and oriented to person, place, and time.    Review of Systems  Psychiatric/Behavioral:  Positive for hallucinations.    Blood pressure 106/70, pulse 85, temperature (!) 97.3 F (36.3 C), resp. rate 16, height 5' 6 (1.676 m), weight 77.1 kg, SpO2 98%. Body mass index is 27.44 kg/m.   PLAN: Safety and Monitoring:  -- Voluntary admission to inpatient psychiatric unit for safety, stabilization and treatment  -- Daily contact with patient to assess and evaluate symptoms and progress in treatment  -- Patient's case to be discussed in multi-disciplinary team meeting  -- Observation Level : q15 minute checks  -- Vital signs:  q12 hours  -- Precautions: suicide, elopement, and assault -- Encouraged patient to participate in unit milieu and in scheduled group therapies   2. Psychiatric Diagnoses and Treatment:   Patient presentation initially appeared consistent with substance-induced mood disorder and psychosis. 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 Symptom profile suggests a chronic underlying psychotic process rather than a purely substance-induced syndrome. Differential remains between schizoaffective disorder vs. schizophrenia vs. bipolar disorder, and longitudinal data will help clarify.  Also notable are shifting somatic complaints and inconsistencies in recall, which suggest possible somatization or thought disorganization rather than clear medical etiology. Akathisia remains on the differential, but underlying psychiatric disorganization is primary concern.  Patient continues to require this acute inpatient psychiatric setting for safety, medication management, and symptom stabilization to address need for continued treatment. Prolixin  has been added related to continued symptoms with plan to wean Zyprexa  in favor of typical antipsychotic in hopes of improved symptom control. VPA ordered for AM, once dose titrated to therapeutic dose will begin discharge planning as, while impaired, presentation no longer acute and may be representative of patient's baseline.   Psychiatric Diagnosis Schizoaffective Disorder vs. Schizophrenia (Primary Thought Disorder - under evaluation) Rule out: Bipolar I Disorder Substance-Induced Mood Disorder (initial consideration - now less likely) Substance-Induced Psychotic Disorder (initial consideration - now less likely) Rule out: Somatic Symptom Disorder  Psychiatric Medications Prolixin  5  mg PO TID Depakote  750 mg PO BID (Depakote  level on 7/20 was 39; repeat VPA and LFTs scheduled for 01/30/24 at 0500) Zyprexa  5 mg PO QHS x2 more nights, then discontinue Cogentin  2 mg PO Q8h PRN for akathisia   3. Medical Issues Being Addressed:  Patient reports right-sided jaw swelling and tooth pain. No fever noted. Patient is allergic to Amoxicillin. Case discussed with Dr. Jadapalle. Plan initiated. Less swelling  and no complaint of pain today.   Start Azithromycin  500 mg PO today, then 250 mg PO daily for 4 days Add salt water  swish and spit Will monitor for progression or acute worsening; outpatient dental referral as needed  Patient also verbalized concern regarding kidney stone pain, citing a strong family history and her own past experiences. However, there were no acute pain episodes noted by staff, and UA was obtained out of caution. Notably, on the following day, patient did not recall these complaints and instead became fixated on family history of Parkinson's disease in connection with the observed tremor in her right arm. Presentation raises concern for possible somatization, especially in the context of memory inconsistencies and shifting focus. 4. Discharge Planning:   -- Social work and case management to assist with discharge planning and identification of hospital follow-up needs prior to discharge  -- Estimated LOS: 3-4 days  Hoy CHRISTELLA Pinal, NP 01/30/2024, 12:29 PM

## 2024-01-30 NOTE — Group Note (Signed)
 Date:  01/30/2024 Time:  9:01 PM  Group Topic/Focus:  Self Care:   The focus of this group is to help patients understand the importance of self-care in order to improve or restore emotional, physical, spiritual, interpersonal, and financial health.    Participation Level:  Active  Participation Quality:  Appropriate  Affect:  Appropriate  Cognitive:  Appropriate  Insight: Appropriate  Engagement in Group:  Engaged  Modes of Intervention:  Discussion  Additional Comments:    Delia Sitar L 01/30/2024, 9:01 PM

## 2024-01-30 NOTE — Group Note (Signed)
 Date:  01/30/2024 Time:  10:05 AM  Group Topic/Focus:  Activity Group: The focus of the group is to promote activity for the patients and encourage them to go outside to the courtyard and get some fresh air and some exercise.    Participation Level:  Active  Participation Quality:  Appropriate  Affect:  Appropriate  Cognitive:  Appropriate  Insight: Appropriate  Engagement in Group:  Engaged  Modes of Intervention:  Activity  Additional Comments:    Camellia HERO Amar Sippel 01/30/2024, 10:05 AM

## 2024-01-30 NOTE — Group Note (Signed)
 [facility] LCSW Group Therapy Note   Group Date: 01/30/2024 Start Time: 1325 End Time: 1445   Type of Therapy/Topic:  Group Therapy:  Balance in Life  Participation Level:  Did Not Attend   Description of Group:    This group will address the concept of balance and how it feels and looks when one is unbalanced. Patients will be encouraged to process areas in their lives that are out of balance, and identify reasons for remaining unbalanced. Facilitators will guide patients utilizing problem- solving interventions to address and correct the stressor making their life unbalanced. Understanding and applying boundaries will be explored and addressed for obtaining  and maintaining a balanced life. Patients will be encouraged to explore ways to assertively make their unbalanced needs known to significant others in their lives, using other group members and facilitator for support and feedback.  Therapeutic Goals: Patient will identify two or more emotions or situations they have that consume much of in their lives. Patient will identify signs/triggers that life has become out of balance:  Patient will identify two ways to set boundaries in order to achieve balance in their lives:  Patient will demonstrate ability to communicate their needs through discussion and/or role plays  Summary of Patient Progress:  The patient did not attend.      Roselyn GORMAN Lento, LCSW

## 2024-01-30 NOTE — Plan of Care (Signed)
  Problem: Education: Goal: Knowledge of [facility] Education information/materials will improve Outcome: Progressing   Problem: Education: Goal: Emotional status will improve Outcome: Progressing   Problem: Education: Goal: Mental status will improve Outcome: Progressing   Problem: Education: Goal: Verbalization of understanding the information provided will improve Outcome: Progressing   

## 2024-01-30 NOTE — Plan of Care (Signed)

## 2024-01-30 NOTE — Progress Notes (Signed)
   01/29/24 2100  Psych Admission Type (Psych Patients Only)  Admission Status Voluntary  Psychosocial Assessment  Patient Complaints None  Eye Contact Glaring  Facial Expression Animated  Affect Anxious  Speech Argumentative  Interaction Assertive  Motor Activity Restless  Appearance/Hygiene Disheveled  Behavior Characteristics Appropriate to situation;Calm  Mood Anxious;Pleasant  Thought Process  Coherency Loose associations  Content Preoccupation  Delusions None reported or observed  Perception WDL  Hallucination None reported or observed  Judgment Impaired  Confusion Mild  Danger to Self  Current suicidal ideation? Denies  Danger to Others  Danger to Others None reported or observed

## 2024-01-30 NOTE — Progress Notes (Signed)
   01/30/24 0900  Psych Admission Type (Psych Patients Only)  Admission Status Voluntary  Psychosocial Assessment  Patient Complaints Other (Comment);None  Eye Contact Fair;Watchful  Facial Expression Fixed smile  Affect Anxious  Speech Logical/coherent  Interaction Isolative (patient has been isolative to room, except for meals and medication; after receiving both, she states to this writer, ok, I'm going back to bed.)  Motor Activity Fidgety;Restless;Tremors (patient has tremor to her right hand)  Appearance/Hygiene Body odor;Disheveled;Poor hygiene  Behavior Characteristics Cooperative;Appropriate to situation  Mood Pleasant  Aggressive Behavior  Effect No apparent injury  Thought Process  Coherency WDL  Content Preoccupation  Delusions None reported or observed  Perception WDL  Hallucination None reported or observed  Judgment Impaired  Confusion Mild  Danger to Self  Current suicidal ideation? Denies  Agreement Not to Harm Self Yes  Description of Agreement Verbal  Danger to Others  Danger to Others None reported or observed   Patient's goal for today, per her self-inventory is to take my medicine.

## 2024-01-30 NOTE — Group Note (Signed)
 Date:  01/30/2024 Time:  12:20 PM  Group Topic/Focus:  Rediscovering Joy:   The focus of this group is to explore various ways to relieve stress in a positive manner.    Participation Level:  Did Not Attend   Camellia HERO Ahna Konkle 01/30/2024, 12:20 PM

## 2024-01-31 NOTE — [facility] Counselor (Signed)
 CSW attempted to touch base with patient's uncle, Larnell Carota at [phone] to assess if patient could be discharged to his home at discharge.   Mr. Larnell did not answer. CSW left HIPAA Compliant voicemail.   Johnel Yielding, MSW, LCSWA 01/31/2024 10:37 AM

## 2024-01-31 NOTE — Group Note (Signed)
 Date:  01/31/2024 Time:  10:59 AM  Group Topic/Focus:  Goals Group:   The focus of this group is to help patients establish daily goals to achieve during treatment and discuss how the patient can incorporate goal setting into their daily lives to aide in recovery.    Participation Level:  Did Not Attend   Allison Bolton 01/31/2024, 10:59 AM

## 2024-01-31 NOTE — Group Note (Signed)
 Date:  01/31/2024 Time:  8:30 PM  Group Topic/Focus:  Wrap-Up Group:   The focus of this group is to help patients review their daily goal of treatment and discuss progress on daily workbooks.    Participation Level:  Active  Participation Quality:  Appropriate and Attentive  Affect:  Appropriate  Cognitive:  Alert and Appropriate  Insight: Appropriate and Good  Engagement in Group:  Developing/Improving and Engaged  Modes of Intervention:  Clarification, Rapport Building, and Support  Additional Comments:     Egbert Seidel 01/31/2024, 8:30 PM

## 2024-01-31 NOTE — Progress Notes (Signed)
   01/31/24 0850  Psych Admission Type (Psych Patients Only)  Admission Status Voluntary  Psychosocial Assessment  Patient Complaints None  Eye Contact Glaring  Facial Expression Animated  Affect Anxious  Speech Logical/coherent  Interaction Assertive  Motor Activity Tremors  Appearance/Hygiene Disheveled  Behavior Characteristics Cooperative  Mood Pleasant;Anxious  Thought Process  Coherency WDL  Content Preoccupation  Delusions None reported or observed  Perception WDL  Hallucination None reported or observed  Judgment Impaired  Confusion Mild  Danger to Self  Current suicidal ideation? Denies  Agreement Not to Harm Self Yes  Description of Agreement verbal  Danger to Others  Danger to Others None reported or observed

## 2024-01-31 NOTE — [facility] Counselor (Addendum)
  CSW touched base with patient's Wylie Winton Sheller at [phone] to engage in safe discharge planning and to assess if patient can return to her home at discharge. According to aunt, she has already left for the beach and will not return until 02/07/24. Aunt reported that the patient can return to her home once she returns from the beach if needed. Aunt also reported that the patient may be able to safely discharge to her uncle Joe's home.   Aunt confirmed her physical address to CSW as: [address] [place], [place] [address]. Aunt to provide Uncle Joe's number for CSW to engage in safe discharge planning.   CSW team to continue to asses.   Myeesha Shane, MSW, LCSWA 01/31/2024 10:30 AM

## 2024-01-31 NOTE — Progress Notes (Signed)
 [facility] MD Progress Note  01/31/2024 10:02 AM NORMAN BIER  MRN:  [id number]  53 year old female with medical history of asthma, fibromyalgia, diabetes mellitus, ovarian and cervical cancer, possible colon cancer, and Mnire's disease, as well as psychiatric history of bipolar disorder and polysubstance abuse, presented to the emergency department with bizarre and chaotic behavior, gross disorganization, and positive urine drug screen for benzodiazepines, amphetamines, and THC. She was administered Geodon  and Ativan  in the ED.   Subjective:  Chart reviewed, case discussed in multidisciplinary meeting, patient seen during rounds. Patient seen today for follow-up psychiatric evaluation. She denies any current complaints. Reports that she may discharge to her uncle's residence, though contact and verification of this plan remain pending. Denies symptoms of depression, anxiety, or psychosis. Denies suicidal ideation (SI) and homicidal ideation (HI). Patient has not demonstrated any behavioral disturbances on the unit. She remains disheveled in appearance and exhibits mild disorganization. However, this presentation is believed to reflect her psychiatric baseline.  Continue current treatment plan without changes. Maintain safety precautions. Monitor mood, thought content, and behavior. Social work will continue efforts to verify discharge plan with uncle and identify alternatives if needed.  Patient remains appropriate for continued inpatient psychiatric care. No changes to current medications at this time given apparent stabilization. Continued monitoring of cognitive status, mood, and orientation. Will coordinate with social work for discharge planning and evaluate need for supervised placement if no valid collateral supports are confirmed.   Sleep: good   Appetite:  Fair  Past Psychiatric History: see h&P Family History:  Family History  Problem Relation Age of Onset   Thyroid  disease Mother     Colon cancer Father    Breast cancer Maternal Aunt    Colon cancer Brother    Colon cancer Other        paternal and maternal grandfather   Meniere's disease Other    Anesthesia problems Neg Hx    Hypotension Neg Hx    Malignant hyperthermia Neg Hx    Pseudochol deficiency Neg Hx    Social History:  Social History   Substance and Sexual Activity  Alcohol Use No   Comment: not since June 2012     Social History   Substance and Sexual Activity  Drug Use No   Comment: patient denies any-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    Social History   Socioeconomic History   Marital status: Legally Separated    Spouse name: Not on file   Number of children: Not on file   Years of education: Not on file   Highest education level: Not on file  Occupational History   Not on file  Tobacco Use   Smoking status: Every Day    Current packs/day: 0.00    Average packs/day: 0.5 packs/day for 37.3 years (18.7 ttl pk-yrs)    Types: Cigarettes    Start date: 07/07/1981    Last attempt to quit: 11/05/2018    Years since quitting: 5.2   Smokeless tobacco: Never  Vaping Use   Vaping status: Never Used  Substance and Sexual Activity   Alcohol use: No    Comment: not since June 2012   Drug use: No    Comment: patient denies any-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   Sexual activity: Never    Birth control/protection: Surgical  Other Topics Concern   Not on file  Social History Narrative   Not on file   Social Drivers of Health  Financial Resource Strain: Not on file  Food Insecurity: Food Insecurity Present (01/15/2024)   Hunger Vital Sign    Worried About Running Out of Food in the Last Year: Often true    Ran Out of Food in the Last Year: Often true  Transportation Needs: Unmet Transportation Needs (01/15/2024)   PRAPARE - Administrator, Civil Service (Medical): Yes    Lack of Transportation (Non-Medical): Yes  Physical Activity: Not on file  Stress: Not  on file  Social Connections: Unknown (10/14/2021)   Received [facility]   Social Connections    Do your friends and family support you?: Not on file    What agencies support you?: Not on file   Past Medical History:  Past Medical History:  Diagnosis Date   Arthritis    Asthma    Bipolar 1 disorder (HCC)    Diverticulosis    Dysrhythmia    sts I have heart palpitations   Fibromyalgia    H/O hiatal hernia    4   Headache(784.0)    High blood pressure    Meniere's disease    S/P colonoscopy    Dr. Golda 2010: few small diverticula at sigmoid. otherwise normal.    Shortness of breath     Past Surgical History:  Procedure Laterality Date   ABDOMINAL HYSTERECTOMY     APPENDECTOMY     CESAREAN SECTION     CHOLECYSTECTOMY     COLONOSCOPY N/A 12/25/2023   Procedure: COLONOSCOPY;  Surgeon: Eartha Angelia Sieving, MD;  Location: [facility];  Service: Gastroenterology;  Laterality: N/A;  11:30am, asa 1   exploratory laparoscopy     FOOT SURGERY     HEMORRHOID SURGERY     LAPAROSCOPIC APPENDECTOMY  02/19/2011   Procedure: APPENDECTOMY LAPAROSCOPIC;  Surgeon: Oneil DELENA Budge;  Location: [facility];  Service: General;  Laterality: N/A;   LAPAROSCOPY  02/19/2011   Procedure: LAPAROSCOPY DIAGNOSTIC;  Surgeon: Oneil DELENA Budge;  Location: [facility];  Service: General;  Laterality: N/A;   left ovarian removal     multiple hernia repairs     Right ovarian removal     TONSILLECTOMY     TONSILLECTOMY AND ADENOIDECTOMY     tubes in ears     UMBILICAL HERNIA REPAIR  Dec 2011   Dr. Budge   wisdom teeth removal      Current Medications: Current Facility-Administered Medications  Medication Dose Route Frequency Provider Last Rate Last Admin   acetaminophen  (TYLENOL ) tablet 650 mg  650 mg Oral Q6H PRN Bobbitt, Shalon E, NP   650 mg at 01/30/24 2111   alum & mag hydroxide-simeth (MAALOX/MYLANTA) 200-200-20 MG/5ML suspension 30 mL  30 mL Oral Q4H PRN Bobbitt, Shalon E,  NP   30 mL at 01/31/24 0033   benzocaine  (ORAJEL) 10 % mucosal gel   Mouth/Throat TID PRN Trudy Carwin, NP   Given at 01/30/24 1219   benztropine  (COGENTIN ) tablet 2 mg  2 mg Oral Q8H PRN Cleotilde Hoy HERO, NP   2 mg at 01/31/24 0857   haloperidol  (HALDOL ) tablet 5 mg  5 mg Oral TID PRN Bobbitt, Shalon E, NP   5 mg at 01/25/24 2059   And   diphenhydrAMINE  (BENADRYL ) capsule 50 mg  50 mg Oral TID PRN Bobbitt, Shalon E, NP   50 mg at 01/25/24 2059   haloperidol  lactate (HALDOL ) injection 5 mg  5 mg Intramuscular TID PRN Bobbitt, Shalon E, NP   5  mg at 01/24/24 1430   And   diphenhydrAMINE  (BENADRYL ) injection 50 mg  50 mg Intramuscular TID PRN Bobbitt, Shalon E, NP   50 mg at 01/24/24 1431   And   LORazepam  (ATIVAN ) injection 2 mg  2 mg Intramuscular TID PRN Bobbitt, Shalon E, NP   2 mg at 01/24/24 1431   haloperidol  lactate (HALDOL ) injection 10 mg  10 mg Intramuscular TID PRN Bobbitt, Shalon E, NP   10 mg at 01/20/24 1413   And   diphenhydrAMINE  (BENADRYL ) injection 50 mg  50 mg Intramuscular TID PRN Bobbitt, Shalon E, NP   50 mg at 01/20/24 1414   And   LORazepam  (ATIVAN ) injection 2 mg  2 mg Intramuscular TID PRN Bobbitt, Shalon E, NP   2 mg at 01/26/24 2054   divalproex  (DEPAKOTE ) DR tablet 750 mg  750 mg Oral Q12H Millington, Matthew E, PA-C   750 mg at 01/31/24 0805   fluPHENAZine  (PROLIXIN ) tablet 5 mg  5 mg Oral TID Cleotilde Hoy HERO, NP   5 mg at 01/31/24 0805   hydrOXYzine  (ATARAX ) tablet 25 mg  25 mg Oral TID PRN Bobbitt, Shalon E, NP   25 mg at 01/31/24 0214   magnesium  hydroxide (MILK OF MAGNESIA) suspension 30 mL  30 mL Oral Daily PRN Bobbitt, Shalon E, NP       nicotine  (NICODERM CQ  - dosed in mg/24 hours) patch 14 mg  14 mg Transdermal Daily Jadapalle, Sree, MD   14 mg at 01/31/24 9193   nicotine  polacrilex (NICORETTE ) gum 2 mg  2 mg Oral PRN Jadapalle, Sree, MD   2 mg at 01/30/24 1747   ondansetron  (ZOFRAN -ODT) disintegrating tablet 4 mg  4 mg Oral Q8H PRN Cleotilde Hoy HERO,  NP   4 mg at 01/29/24 2125   phenylephrine -shark liver oil-mineral oil-petrolatum  (PREPARATION H) rectal ointment 1 Application  1 Application Rectal BID PRN Millington, Matthew E, PA-C   1 Application at 01/19/24 2257   traZODone  (DESYREL ) tablet 50 mg  50 mg Oral QHS PRN Bobbitt, Shalon E, NP   50 mg at 01/29/24 2143    Lab Results:  Results for orders placed or performed during the hospital encounter of 01/15/24 (from the past 48 hours)  Hepatic function panel     Status: None   Collection Time: 01/30/24  8:21 AM  Result Value Ref Range   Total Protein 6.8 6.5 - 8.1 g/dL   Albumin 3.6 3.5 - 5.0 g/dL   AST 16 15 - 41 U/L   ALT 19 0 - 44 U/L   Alkaline Phosphatase 54 38 - 126 U/L   Total Bilirubin 0.7 0.0 - 1.2 mg/dL   Bilirubin, Direct <9.8 0.0 - 0.2 mg/dL   Indirect Bilirubin NOT CALCULATED 0.3 - 0.9 mg/dL    Comment: Performed at [facility], [address] [place], [place] [address]  Valproic  acid level     Status: None   Collection Time: 01/30/24  8:21 AM  Result Value Ref Range   Valproic  Acid Lvl 64 50 - 100 ug/mL    Comment: Performed at [facility], [address] [place], [place] [address]      Blood Alcohol level:  Lab Results  Component Value Date   [facility] <15 01/14/2024   ETH <10 05/07/2019    Metabolic Disorder Labs: Lab Results  Component Value Date   HGBA1C 5.1 01/18/2024   MPG 99.67 01/18/2024   MPG 105.41 05/11/2019   Lab Results  Component Value Date   PROLACTIN 24.1 (H) 05/11/2019   Lab Results  Component Value Date   CHOL 154 01/18/2024   TRIG 180 (H) 01/18/2024   HDL 49 01/18/2024   CHOLHDL 3.1 01/18/2024   VLDL 36 01/18/2024   LDLCALC 69 01/18/2024   LDLCALC 82 05/11/2019      Psychiatric Specialty Exam: Appearance: Disheveled, cooperative Behavior: Seen walking in hall, no tremor noted today Mood: Euthymic Affect: Bright, less labile than prior days Speech: Normal rate and tone Thought Process: Tangential,  vague Thought Content: No delusions, hallucinations, SI, or HI Insight: Poor Judgment: Impaired Eye Contact: Adequate Attention: Intact with redirection Psychomotor Activity: Normal Orientation: Limited (continues to report conflicting information regarding residence) Sleep: WNL Appetite: WNL Memory, Concentration, Recall, Fund of Knowledge, Language: Mildly impaired or inconsistent   Musculoskeletal: Strength & Muscle Tone: within normal limits Gait & Station: normal    Physical Exam: Physical Exam Vitals and nursing note reviewed.  HENT:     Head: Atraumatic.  Eyes:     Extraocular Movements: Extraocular movements intact.  Pulmonary:     Effort: Pulmonary effort is normal.  Neurological:     Mental Status: She is alert and oriented to person, place, and time.    Review of Systems  Psychiatric/Behavioral:  Positive for hallucinations.    Blood pressure (!) 103/91, pulse 77, temperature (!) 97.1 F (36.2 C), resp. rate 16, height 5' 6 (1.676 m), weight 77.1 kg, SpO2 100%. Body mass index is 27.44 kg/m.   PLAN: Safety and Monitoring:  -- Voluntary admission to inpatient psychiatric unit for safety, stabilization and treatment  -- Daily contact with patient to assess and evaluate symptoms and progress in treatment  -- Patient's case to be discussed in multi-disciplinary team meeting  -- Observation Level : q15 minute checks  -- Vital signs:  q12 hours  -- Precautions: suicide, elopement, and assault -- Encouraged patient to participate in unit milieu and in scheduled group therapies   2. Psychiatric Diagnoses and Treatment:   Patient presentation initially appeared consistent with substance-induced mood disorder and psychosis. 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 Symptom profile suggests a  chronic underlying psychotic process rather than a purely substance-induced syndrome. Differential remains between schizoaffective disorder vs. schizophrenia vs. bipolar disorder, and longitudinal data will help clarify.  Noted to be resting today with no abnormal movements noted, no somatic complaints today.   Patient continues to require this acute inpatient psychiatric setting for safety, medication management, and symptom stabilization to address need for continued treatment. Prolixin  has been added related to continued symptoms with plan to wean Zyprexa  in favor of typical antipsychotic in hopes of improved symptom control. VPA 01/30/24 64.   Psychiatric Diagnosis Schizoaffective Disorder vs. Schizophrenia (Primary Thought Disorder - under evaluation) Rule out: Bipolar I Disorder Substance-Induced Mood Disorder (initial consideration - now less likely) Substance-Induced Psychotic Disorder (initial consideration - now less likely) Rule out: Somatic Symptom Disorder  Psychiatric Medications Prolixin  5 mg PO TID Depakote  750 mg PO BID (Depakote  level on 7/20 was 39; repeat VPA today 64 Zyprexa  5 mg PO QHS x2 more nights, then discontinue Cogentin  2 mg PO Q8h PRN for akathisia   3. Medical Issues Being Addressed:  Patient reports right-sided jaw swelling and tooth pain. No fever noted. Patient is allergic to Amoxicillin. Case discussed with Dr. Jadapalle. Plan initiated. Less swelling  and no complaint of pain today.   Start Azithromycin  500 mg PO today, then 250 mg PO daily for 4 days Add salt water  swish and spit Will monitor for progression or acute worsening; outpatient dental referral as needed  Patient also verbalized concern regarding kidney stone pain, citing a strong family history and her own past experiences. However, there were no acute pain episodes noted by staff, and UA was obtained out of caution. Notably, on the following day, patient did not recall these complaints and  instead became fixated on family history of Parkinson's disease in connection with the observed tremor in her right arm. Presentation raises concern for possible somatization, especially in the context of memory inconsistencies and shifting focus. 4. Discharge Planning:   -- Social work and case management to assist with discharge planning and identification of hospital follow-up needs prior to discharge  -- Estimated LOS: 3-4 days  Hoy CHRISTELLA Pinal, NP 01/31/2024, 10:02 AM

## 2024-01-31 NOTE — Plan of Care (Signed)
  Problem: Education: Goal: Mental status will improve Outcome: Progressing   

## 2024-01-31 NOTE — Plan of Care (Signed)
  Problem: Education: Goal: Knowledge of [facility] Education information/materials will improve Outcome: Progressing   Problem: Education: Goal: Emotional status will improve Outcome: Progressing   Problem: Education: Goal: Mental status will improve Outcome: Progressing   Problem: Education: Goal: Verbalization of understanding the information provided will improve Outcome: Progressing   

## 2024-02-01 DIAGNOSIS — F259 Schizoaffective disorder, unspecified: Secondary | ICD-10-CM

## 2024-02-01 MED ORDER — BENZTROPINE MESYLATE 1 MG PO TABS
1.0000 mg | ORAL_TABLET | Freq: Two times a day (BID) | ORAL | Status: DC
  Administered 2024-02-01 – 2024-02-04 (×6): 1 mg via ORAL
  Filled 2024-02-01 (×6): qty 1

## 2024-02-01 NOTE — Progress Notes (Signed)
 [facility] MD Progress Note  02/01/2024 4:10 PM Allison Bolton  MRN:  [id number]  53 year old female with medical history of asthma, fibromyalgia, diabetes mellitus, ovarian and cervical cancer, possible colon cancer, and Mnire's disease, as well as psychiatric history of bipolar disorder and polysubstance abuse, presented to the emergency department with bizarre and chaotic behavior, gross disorganization, and positive urine drug screen for benzodiazepines, amphetamines, and THC. She was administered Geodon  and Ativan  in the ED.   Subjective:  Chart reviewed, case discussed in multidisciplinary meeting, patient seen during rounds.  Patient seen for follow-up today.  She denies current complaints.  Again is questioning discharge today.  Discussed that disposition is pending.  Denies SI, HI, and AVH.  Continues to have some mood lability and got upset when they were told that they were not discharging today.  Did not require PRNs for agitation overnight.  Continue current treatment plan without changes. Maintain safety precautions. Monitor mood, thought content, and behavior. Social work will continue efforts to verify discharge plan with uncle and identify alternatives if needed.  Patient remains appropriate for continued inpatient psychiatric care.  Cogentin  scheduled and tapered to 1 mg twice daily.Continued monitoring of cognitive status, mood, and orientation. Will coordinate with social work for discharge planning and evaluate need for supervised placement if no valid collateral supports are confirmed.   Sleep: good   Appetite:  Fair  Past Psychiatric History: see h&P Family History:  Family History  Problem Relation Age of Onset   Thyroid  disease Mother    Colon cancer Father    Breast cancer Maternal Aunt    Colon cancer Brother    Colon cancer Other        paternal and maternal grandfather   Meniere's disease Other    Anesthesia problems Neg Hx    Hypotension Neg Hx     Malignant hyperthermia Neg Hx    Pseudochol deficiency Neg Hx    Social History:  Social History   Substance and Sexual Activity  Alcohol Use No   Comment: not since June 2012     Social History   Substance and Sexual Activity  Drug Use No   Comment: patient denies any-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    Social History   Socioeconomic History   Marital status: Legally Separated    Spouse name: Not on file   Number of children: Not on file   Years of education: Not on file   Highest education level: Not on file  Occupational History   Not on file  Tobacco Use   Smoking status: Every Day    Current packs/day: 0.00    Average packs/day: 0.5 packs/day for 37.3 years (18.7 ttl pk-yrs)    Types: Cigarettes    Start date: 07/07/1981    Last attempt to quit: 11/05/2018    Years since quitting: 5.2   Smokeless tobacco: Never  Vaping Use   Vaping status: Never Used  Substance and Sexual Activity   Alcohol use: No    Comment: not since June 2012   Drug use: No    Comment: patient denies any-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   Sexual activity: Never    Birth control/protection: Surgical  Other Topics Concern   Not on file  Social History Narrative   Not on file   Social Drivers of Health   Financial Resource Strain: Not on file  Food Insecurity: Food Insecurity Present (01/15/2024)   Hunger Vital Sign  Worried About Programme researcher, broadcasting/film/video in the Last Year: Often true    Barista in the Last Year: Often true  Transportation Needs: Unmet Transportation Needs (01/15/2024)   PRAPARE - Administrator, Civil Service (Medical): Yes    Lack of Transportation (Non-Medical): Yes  Physical Activity: Not on file  Stress: Not on file  Social Connections: Unknown (10/14/2021)   Received from [facility]   Social Connections    Do your friends and family support you?: Not on file    What agencies support you?: Not on file   Past  Medical History:  Past Medical History:  Diagnosis Date   Arthritis    Asthma    Bipolar 1 disorder (HCC)    Diverticulosis    Dysrhythmia    sts I have heart palpitations   Fibromyalgia    H/O hiatal hernia    4   Headache(784.0)    High blood pressure    Meniere's disease    S/P colonoscopy    Dr. Golda 2010: few small diverticula at sigmoid. otherwise normal.    Shortness of breath     Past Surgical History:  Procedure Laterality Date   ABDOMINAL HYSTERECTOMY     APPENDECTOMY     CESAREAN SECTION     CHOLECYSTECTOMY     COLONOSCOPY N/A 12/25/2023   Procedure: COLONOSCOPY;  Surgeon: Eartha Angelia Sieving, MD;  Location: [facility];  Service: Gastroenterology;  Laterality: N/A;  11:30am, asa 1   exploratory laparoscopy     FOOT SURGERY     HEMORRHOID SURGERY     LAPAROSCOPIC APPENDECTOMY  02/19/2011   Procedure: APPENDECTOMY LAPAROSCOPIC;  Surgeon: Oneil DELENA Budge;  Location: [facility] OR[facility];  Service: General;  Laterality: N/A;   LAPAROSCOPY  02/19/2011   Procedure: LAPAROSCOPY DIAGNOSTIC;  Surgeon: Oneil DELENA Budge;  Location: [facility] OR[facility];  Service: General;  Laterality: N/A;   left ovarian removal     multiple hernia repairs     Right ovarian removal     TONSILLECTOMY     TONSILLECTOMY AND ADENOIDECTOMY     tubes in ears     UMBILICAL HERNIA REPAIR  Dec 2011   Dr. Budge   wisdom teeth removal      Current Medications: Current Facility-Administered Medications  Medication Dose Route Frequency Provider Last Rate Last Admin   acetaminophen  (TYLENOL ) tablet 650 mg  650 mg Oral Q6H PRN Bobbitt, Shalon E, NP   650 mg at 02/01/24 0814   alum & mag hydroxide-simeth (MAALOX/MYLANTA) 200-200-20 MG/5ML suspension 30 mL  30 mL Oral Q4H PRN Bobbitt, Shalon E, NP   30 mL at 01/31/24 1946   benzocaine  (ORAJEL) 10 % mucosal gel   Mouth/Throat TID PRN Trudy Carwin, NP   Given at 01/30/24 1219   benztropine  (COGENTIN ) tablet 1 mg  1 mg Oral BID Gabrielly Mccrystal E, PA-C        haloperidol  (HALDOL ) tablet 5 mg  5 mg Oral TID PRN Bobbitt, Shalon E, NP   5 mg at 01/25/24 2059   And   diphenhydrAMINE  (BENADRYL ) capsule 50 mg  50 mg Oral TID PRN Bobbitt, Shalon E, NP   50 mg at 01/25/24 2059   haloperidol  lactate (HALDOL ) injection 5 mg  5 mg Intramuscular TID PRN Bobbitt, Shalon E, NP   5 mg at 01/24/24 1430   And   diphenhydrAMINE  (BENADRYL ) injection 50 mg  50 mg Intramuscular TID PRN Bobbitt, Shalon E, NP  50 mg at 01/24/24 1431   And   LORazepam  (ATIVAN ) injection 2 mg  2 mg Intramuscular TID PRN Bobbitt, Shalon E, NP   2 mg at 01/24/24 1431   haloperidol  lactate (HALDOL ) injection 10 mg  10 mg Intramuscular TID PRN Bobbitt, Shalon E, NP   10 mg at 01/20/24 1413   And   diphenhydrAMINE  (BENADRYL ) injection 50 mg  50 mg Intramuscular TID PRN Bobbitt, Shalon E, NP   50 mg at 01/20/24 1414   And   LORazepam  (ATIVAN ) injection 2 mg  2 mg Intramuscular TID PRN Bobbitt, Shalon E, NP   2 mg at 01/26/24 2054   divalproex  (DEPAKOTE ) DR tablet 750 mg  750 mg Oral Q12H Angelyne Terwilliger E, PA-C   750 mg at 02/01/24 0813   fluPHENAZine  (PROLIXIN ) tablet 5 mg  5 mg Oral TID Cleotilde Hoy HERO, NP   5 mg at 02/01/24 1248   hydrOXYzine  (ATARAX ) tablet 25 mg  25 mg Oral TID PRN Bobbitt, Shalon E, NP   25 mg at 02/01/24 1501   magnesium  hydroxide (MILK OF MAGNESIA) suspension 30 mL  30 mL Oral Daily PRN Bobbitt, Shalon E, NP       nicotine  (NICODERM CQ  - dosed in mg/24 hours) patch 14 mg  14 mg Transdermal Daily Jadapalle, Sree, MD   14 mg at 02/01/24 0813   nicotine  polacrilex (NICORETTE ) gum 2 mg  2 mg Oral PRN Jadapalle, Sree, MD   2 mg at 01/31/24 1726   ondansetron  (ZOFRAN -ODT) disintegrating tablet 4 mg  4 mg Oral Q8H PRN Cleotilde Hoy HERO, NP   4 mg at 01/29/24 2125   phenylephrine -shark liver oil-mineral oil-petrolatum  (PREPARATION H) rectal ointment 1 Application  1 Application Rectal BID PRN Rockell Faulks E, PA-C   1 Application at 01/19/24 2257   traZODone   (DESYREL ) tablet 50 mg  50 mg Oral QHS PRN Bobbitt, Shalon E, NP   50 mg at 01/31/24 2111    Lab Results:  No results found for this or any previous visit (from the past 48 hours).     Blood Alcohol level:  Lab Results  Component Value Date   [facility] <15 01/14/2024   ETH <10 05/07/2019    Metabolic Disorder Labs: Lab Results  Component Value Date   HGBA1C 5.1 01/18/2024   MPG 99.67 01/18/2024   MPG 105.41 05/11/2019   Lab Results  Component Value Date   PROLACTIN 24.1 (H) 05/11/2019   Lab Results  Component Value Date   CHOL 154 01/18/2024   TRIG 180 (H) 01/18/2024   HDL 49 01/18/2024   CHOLHDL 3.1 01/18/2024   VLDL 36 01/18/2024   LDLCALC 69 01/18/2024   LDLCALC 82 05/11/2019      Psychiatric Specialty Exam: Appearance: Disheveled, cooperative Behavior: Seen walking in hall, no tremor noted today Mood: Euthymic Affect: Bright, less labile than prior days Speech: Normal rate and tone Thought Process: Tangential, vague Thought Content: No delusions, hallucinations, SI, or HI Insight: Poor Judgment: Impaired Eye Contact: Adequate Attention: Intact with redirection Psychomotor Activity: Normal Orientation: Limited (continues to report conflicting information regarding residence) Sleep: WNL Appetite: WNL Memory, Concentration, Recall, Fund of Knowledge, Language: Mildly impaired or inconsistent   Musculoskeletal: Strength & Muscle Tone: within normal limits Gait & Station: normal    Physical Exam: Physical Exam Vitals and nursing note reviewed.  HENT:     Head: Atraumatic.  Eyes:     Extraocular Movements: Extraocular movements intact.  Pulmonary:  Effort: Pulmonary effort is normal.  Neurological:     Mental Status: She is alert and oriented to person, place, and time.    Review of Systems  Psychiatric/Behavioral:  Positive for hallucinations.    Blood pressure 108/62, pulse 71, temperature (!) 97.3 F (36.3 C), resp. rate 16, height 5' 6  (1.676 m), weight 77.1 kg, SpO2 97%. Body mass index is 27.44 kg/m.   PLAN: Safety and Monitoring:  -- Voluntary admission to inpatient psychiatric unit for safety, stabilization and treatment  -- Daily contact with patient to assess and evaluate symptoms and progress in treatment  -- Patient's case to be discussed in multi-disciplinary team meeting  -- Observation Level : q15 minute checks  -- Vital signs:  q12 hours  -- Precautions: suicide, elopement, and assault -- Encouraged patient to participate in unit milieu and in scheduled group therapies   2. Psychiatric Diagnoses and Treatment:   Patient presentation initially appeared consistent with substance-induced mood disorder and psychosis. 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 Symptom profile suggests a chronic underlying psychotic process rather than a purely substance-induced syndrome. Differential remains between schizoaffective disorder vs. schizophrenia vs. bipolar disorder, and longitudinal data will help clarify.  Noted to be resting today with no abnormal movements noted, no somatic complaints today.   Patient continues to require this acute inpatient psychiatric setting for safety, medication management, and symptom stabilization to address need for continued treatment. Prolixin  has been added related to continued symptoms with plan to wean Zyprexa  in favor of typical antipsychotic in hopes of improved symptom control. VPA 01/30/24 64.   Psychiatric Diagnosis Schizoaffective Disorder vs. Schizophrenia (Primary Thought Disorder - under evaluation) Rule out: Bipolar I Disorder Substance-Induced Mood Disorder (initial consideration - now less likely) Substance-Induced Psychotic Disorder (initial consideration - now less likely) Rule out: Somatic Symptom  Disorder  Psychiatric Medications Prolixin  5 mg PO TID Depakote  750 mg PO BID (Depakote  level on 7/20 was 39; repeat VPA today 64 Zyprexa  5 mg PO QHS x2 more nights, then discontinue Decrease Cogentin  to 1 mg twice daily scheduled   3. Medical Issues Being Addressed:  7/28Prior concerns reviewed no new concerns discussed today.  No ongoing jaw swelling or tooth pain.  No concerns about kidney stone today.  Encouraged hydration.    7/27 Patient reports right-sided jaw swelling and tooth pain. No fever noted. Patient is allergic to Amoxicillin. Case discussed with Dr. Jadapalle. Plan initiated. Less swelling and no complaint of pain today.   Start Azithromycin  500 mg PO today, then 250 mg PO daily for 4 days Add salt water  swish and spit Will monitor for progression or acute worsening; outpatient dental referral as needed  Patient also verbalized concern regarding kidney stone pain, citing a strong family history and her own past experiences. However, there were no acute pain episodes noted by staff, and UA was obtained out of caution. Notably, on the following day, patient did not recall these complaints and instead became fixated on family history of Parkinson's disease in connection with the observed tremor in her right arm. Presentation raises concern for possible somatization, especially in the context of memory inconsistencies and shifting focus. 4. Discharge Planning:   -- Social work and case management to assist with discharge planning and identification of hospital follow-up needs prior to discharge  -- Estimated LOS: 3-4 days  Donnice FORBES Right, PA-C  02/01/2024, 4:10 PM

## 2024-02-01 NOTE — Group Note (Signed)
 [facility] LCSW Group Therapy Note    Group Date: 02/01/2024 Start Time: 1300 End Time: 1400  Type of Therapy and Topic:  Group Therapy:  Overcoming Obstacles  Participation Level:  [facility] PARTICIPATION LEVEL: Did Not Attend   Description of Group:   In this group patients will be encouraged to explore what they see as obstacles to their own wellness and recovery. They will be guided to discuss their thoughts, feelings, and behaviors related to these obstacles. The group will process together ways to cope with barriers, with attention given to specific choices patients can make. Each patient will be challenged to identify changes they are motivated to make in order to overcome their obstacles. This group will be process-oriented, with patients participating in exploration of their own experiences as well as giving and receiving support and challenge from other group members.  Therapeutic Goals: 1. Patient will identify personal and current obstacles as they relate to admission. 2. Patient will identify barriers that currently interfere with their wellness or overcoming obstacles.  3. Patient will identify feelings, thought process and behaviors related to these barriers. 4. Patient will identify two changes they are willing to make to overcome these obstacles:    Summary of Patient Progress X   Therapeutic Modalities:   Cognitive Behavioral Therapy Solution Focused Therapy Motivational Interviewing Relapse Prevention Therapy   [name], LCSW

## 2024-02-01 NOTE — Plan of Care (Signed)
  Problem: Education: Goal: Emotional status will improve Outcome: Progressing Goal: Mental status will improve Outcome: Progressing   Problem: Activity: Goal: Interest or engagement in activities will improve Outcome: Progressing   Problem: Coping: Goal: Ability to verbalize frustrations and anger appropriately will improve Outcome: Progressing Goal: Ability to demonstrate self-control will improve Outcome: Progressing   Problem: Health Behavior/Discharge Planning: Goal: Compliance with treatment plan for underlying cause of condition will improve Outcome: Progressing   Problem: Physical Regulation: Goal: Ability to maintain clinical measurements within normal limits will improve Outcome: Progressing

## 2024-02-01 NOTE — Progress Notes (Signed)
   02/01/24 1100  Psych Admission Type (Psych Patients Only)  Admission Status Voluntary  Psychosocial Assessment  Patient Complaints Anxiety;Insomnia;Restlessness  Eye Contact Fair  Facial Expression Animated;Anxious  Affect Anxious  Speech Logical/coherent  Interaction Assertive  Motor Activity Restless;Tremors  Appearance/Hygiene Designer, industrial/product Cooperative;Anxious;Restless  Mood Anxious;Pleasant (Patient writes her goal for today is taking meds. She writes she will say prayers to God to help her reach that goal. Patient is also requesting information about discharge.)  Thought Process  Coherency Loose associations  Content Preoccupation  Delusions None reported or observed  Perception WDL  Hallucination None reported or observed  Judgment Impaired  Confusion Mild  Danger to Self  Current suicidal ideation? Denies  Danger to Others  Danger to Others None reported or observed

## 2024-02-01 NOTE — Group Note (Signed)
 Recreation Therapy Group Note   Group Topic:Coping Skills  Group Date: 02/01/2024 Start Time: 1530 End Time: 1615 Facilitators: Celestia Jeoffrey FORBES ARTICE, CTRS Location: Craft Room   Group Description: Coping A-Z. LRT and patients engage in a guided discussion on what coping skills are and gave specific examples. LRT passed out a handout labeled Coping A-Z with blank spaces beside each letter. LRT prompted patients to come up with a coping skill for each of the letters. LRT and patients went over the handout and gave ideas for each letter if anyone had any blanks left on their paper. Patients kept this handout with them that listed 26 different coping skills.   Goal Area(s) Addressed: Patients will be able to define "coping skills". Patient will identify new coping skills.  Patient will increase communication.  Affect/Mood: N/A   Participation Level: Did not attend    Clinical Observations/Individualized Feedback: Patient did not attend group.   Plan: Continue to engage patient in RT group sessions 2-3x/week.   [address] LRT, CTRS 02/01/2024 5:10 PM

## 2024-02-01 NOTE — Group Note (Signed)
 Date:  02/01/2024 Time:  10:46 AM  Group Topic/Focus:  Goals Group:   The focus of this group is to help patients establish daily goals to achieve during treatment and discuss how the patient can incorporate goal setting into their daily lives to aide in recovery.    Participation Level:  Active  Participation Quality:  Appropriate  Affect:  Appropriate  Cognitive:  Alert  Insight: Appropriate  Engagement in Group:  Engaged  Modes of Intervention:  Activity, Discussion, and Education  Additional Comments:    Allison Bolton 02/01/2024, 10:46 AM

## 2024-02-01 NOTE — Group Note (Signed)
 Date:  02/01/2024 Time:  6:43 PM  Group Topic/Focus:  Goals Group:   The focus of this group is to help patients establish daily goals to achieve during treatment and discuss how the patient can incorporate goal setting into their daily lives to aide in recovery.    Participation Level:  Did Not Attend   Skippy LITTIE Bennett 02/01/2024, 6:43 PM

## 2024-02-01 NOTE — Group Note (Signed)
 Date:  02/01/2024 Time:  8:45 PM  Group Topic/Focus:  Wrap-Up Group:   The focus of this group is to help patients review their daily goal of treatment and discuss progress on daily workbooks.    Participation Level:  Active  Participation Quality:  Appropriate  Affect:  Appropriate  Cognitive:  Appropriate  Insight: Appropriate  Engagement in Group:  Engaged  Modes of Intervention:  Discussion  Additional Comments:     Kerri Katz 02/01/2024, 8:45 PM

## 2024-02-01 NOTE — Plan of Care (Signed)
  Problem: Education: Goal: Knowledge of [facility] Education information/materials will improve Outcome: Progressing   Problem: Education: Goal: Emotional status will improve Outcome: Progressing   Problem: Education: Goal: Verbalization of understanding the information provided will improve Outcome: Progressing   

## 2024-02-01 NOTE — Group Note (Signed)
 Recreation Therapy Group Note   Group Topic:Coping Skills  Group Date: 02/01/2024 Start Time: 1040 End Time: 1135 Facilitators: Celestia Jeoffrey FORBES ARTICE, CTRS Location: Craft Room  Group Description: Mind Map.  Patient was provided a blank template of a diagram with 32 blank boxes in a tiered system, branching from the center (similar to a bubble chart). LRT directed patients to label the middle of the diagram Coping Skills. LRT and patients then came up with 8 different coping skills as examples. Pt were directed to record their coping skills in the 2nd tier boxes closest to the center.  Patients would then share their coping skills with the group as LRT wrote them out. LRT gave a handout of 99 different coping skills at the end of group.   Goal Area(s) Addressed: Patients will be able to define "coping skills". Patient will identify new coping skills.  Patient will increase communication.  Affect/Mood: Appropriate   Participation Level: Minimal    Clinical Observations/Individualized Feedback: Allison Bolton was in and out of group. Pt left early and did not return.   Plan: Continue to engage patient in RT group sessions 2-3x/week.   Jeoffrey FORBES Celestia, LRT, CTRS 02/01/2024 1:02 PM

## 2024-02-02 ENCOUNTER — Encounter (HOSPITAL_COMMUNITY): Admitting: Physical Therapy

## 2024-02-02 NOTE — Group Note (Signed)
 Recreation Therapy Group Note   Group Topic:Goal Setting  Group Date: 02/02/2024 Start Time: 1015 End Time: 1120 Facilitators: Celestia Jeoffrey BRAVO, LRT, CTRS Location: Craft Room  Group Description: Product/process development scientist. Patients were given many different magazines, a glue stick, markers, and a piece of cardstock paper. LRT and pts discussed the importance of having goals in life. LRT and pts discussed the difference between short-term and long-term goals, as well as what a SMART goal is. LRT encouraged pts to create a vision board, with images they picked and then cut out with safety scissors from the magazine, for themselves, that capture their short and long-term goals. LRT encouraged pts to show and explain their vision board to the group.   Goal Area(s) Addressed:  Patient will gain knowledge of short vs. long term goals.  Patient will identify goals for themselves. Patient will practice setting SMART goals. Patient will verbalize their goals to LRT and peers.   Affect/Mood: N/A   Participation Level: Did not attend    Clinical Observations/Individualized Feedback: Patient did not attend group.   Plan: Continue to engage patient in RT group sessions 2-3x/week.   Jeoffrey BRAVO Celestia, LRT, CTRS 02/02/2024 11:55 AM

## 2024-02-02 NOTE — Progress Notes (Signed)
 [facility] MD Progress Note  02/02/2024 4:13 PM Allison Bolton  MRN:  [id number]  53 year old female with medical history of asthma, fibromyalgia, diabetes mellitus, ovarian and cervical cancer, possible colon cancer, and Mnire's disease, as well as psychiatric history of bipolar disorder and polysubstance abuse, presented to the emergency department with bizarre and chaotic behavior, gross disorganization, and positive urine drug screen for benzodiazepines, amphetamines, and THC. She was administered Geodon  and Ativan  in the ED.   Subjective:  Chart reviewed, case discussed in multidisciplinary meeting, patient seen during rounds.  Patient seen for follow-up today.  Denies current complaints.  Notes involuntary hand movements have improved.  Request update on discharge.  Discussed she will likely be a for discharge towards the end of the week.  Denies SI, HI, and AVH.  Mood is stable during exam.  She notes stable appetite and sleep.  She indicates she is performing ADLs.  Will continue current treatment plan without changes. Maintain safety precautions. Monitor mood, thought content, and behavior. Social work will continue efforts to verify discharge plan with uncle and identify alternatives if needed.  Patient remains appropriate for continued inpatient psychiatric care.  Patient tolerated adjusted Cogentin  dosing well.  Will continue current medication regimen .Continued monitoring of cognitive status, mood, and orientation. Will coordinate with social work for discharge planning and evaluate need for supervised placement if no valid collateral supports are confirmed.   Sleep: good   Appetite:  Fair  Past Psychiatric History: see h&P Family History:  Family History  Problem Relation Age of Onset   Thyroid  disease Mother    Colon cancer Father    Breast cancer Maternal Aunt    Colon cancer Brother    Colon cancer Other        paternal and maternal grandfather   Meniere's disease Other     Anesthesia problems Neg Hx    Hypotension Neg Hx    Malignant hyperthermia Neg Hx    Pseudochol deficiency Neg Hx    Social History:  Social History   Substance and Sexual Activity  Alcohol Use No   Comment: not since June 2012     Social History   Substance and Sexual Activity  Drug Use No   Comment: patient denies any-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    Social History   Socioeconomic History   Marital status: Legally Separated    Spouse name: Not on file   Number of children: Not on file   Years of education: Not on file   Highest education level: Not on file  Occupational History   Not on file  Tobacco Use   Smoking status: Every Day    Current packs/day: 0.00    Average packs/day: 0.5 packs/day for 37.3 years (18.7 ttl pk-yrs)    Types: Cigarettes    Start date: 07/07/1981    Last attempt to quit: 11/05/2018    Years since quitting: 5.2   Smokeless tobacco: Never  Vaping Use   Vaping status: Never Used  Substance and Sexual Activity   Alcohol use: No    Comment: not since June 2012   Drug use: No    Comment: patient denies any-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   Sexual activity: Never    Birth control/protection: Surgical  Other Topics Concern   Not on file  Social History Narrative   Not on file   Social Drivers of Health   Financial Resource Strain: Not on file  Food Insecurity: Food Insecurity Present (01/15/2024)   Hunger Vital Sign    Worried About Running Out of Food in the Last Year: Often true    Ran Out of Food in the Last Year: Often true  Transportation Needs: Unmet Transportation Needs (01/15/2024)   PRAPARE - Administrator, Civil Service (Medical): Yes    Lack of Transportation (Non-Medical): Yes  Physical Activity: Not on file  Stress: Not on file  Social Connections: Unknown (10/14/2021)   Received from [facility]   Social Connections    Do your friends and family support you?: Not on file     What agencies support you?: Not on file   Past Medical History:  Past Medical History:  Diagnosis Date   Arthritis    Asthma    Bipolar 1 disorder (HCC)    Diverticulosis    Dysrhythmia    sts I have heart palpitations   Fibromyalgia    H/O hiatal hernia    4   Headache(784.0)    High blood pressure    Meniere's disease    S/P colonoscopy    Dr. Golda 2010: few small diverticula at sigmoid. otherwise normal.    Shortness of breath     Past Surgical History:  Procedure Laterality Date   ABDOMINAL HYSTERECTOMY     APPENDECTOMY     CESAREAN SECTION     CHOLECYSTECTOMY     COLONOSCOPY N/A 12/25/2023   Procedure: COLONOSCOPY;  Surgeon: Eartha Angelia Sieving, MD;  Location: [facility];  Service: Gastroenterology;  Laterality: N/A;  11:30am, asa 1   exploratory laparoscopy     FOOT SURGERY     HEMORRHOID SURGERY     LAPAROSCOPIC APPENDECTOMY  02/19/2011   Procedure: APPENDECTOMY LAPAROSCOPIC;  Surgeon: Oneil DELENA Budge;  Location: [facility];  Service: General;  Laterality: N/A;   LAPAROSCOPY  02/19/2011   Procedure: LAPAROSCOPY DIAGNOSTIC;  Surgeon: Oneil DELENA Budge;  Location: [facility];  Service: General;  Laterality: N/A;   left ovarian removal     multiple hernia repairs     Right ovarian removal     TONSILLECTOMY     TONSILLECTOMY AND ADENOIDECTOMY     tubes in ears     UMBILICAL HERNIA REPAIR  Dec 2011   Dr. Budge   wisdom teeth removal      Current Medications: Current Facility-Administered Medications  Medication Dose Route Frequency Provider Last Rate Last Admin   acetaminophen  (TYLENOL ) tablet 650 mg  650 mg Oral Q6H PRN Bobbitt, Shalon E, NP   650 mg at 02/01/24 2121   alum & mag hydroxide-simeth (MAALOX/MYLANTA) 200-200-20 MG/5ML suspension 30 mL  30 mL Oral Q4H PRN Bobbitt, Shalon E, NP   30 mL at 02/01/24 2143   benzocaine  (ORAJEL) 10 % mucosal gel   Mouth/Throat TID PRN Trudy Carwin, NP   Given at 02/01/24 1950   benztropine  (COGENTIN ) tablet 1 mg  1  mg Oral BID Carzell Saldivar E, PA-C   1 mg at 02/02/24 9192   haloperidol  (HALDOL ) tablet 5 mg  5 mg Oral TID PRN Bobbitt, Shalon E, NP   5 mg at 01/25/24 2059   And   diphenhydrAMINE  (BENADRYL ) capsule 50 mg  50 mg Oral TID PRN Bobbitt, Shalon E, NP   50 mg at 02/01/24 2122   haloperidol  lactate (HALDOL ) injection 5 mg  5 mg Intramuscular TID PRN Bobbitt, Shalon E, NP   5 mg at 01/24/24 1430   And  diphenhydrAMINE  (BENADRYL ) injection 50 mg  50 mg Intramuscular TID PRN Bobbitt, Shalon E, NP   50 mg at 01/24/24 1431   And   LORazepam  (ATIVAN ) injection 2 mg  2 mg Intramuscular TID PRN Bobbitt, Shalon E, NP   2 mg at 01/24/24 1431   haloperidol  lactate (HALDOL ) injection 10 mg  10 mg Intramuscular TID PRN Bobbitt, Shalon E, NP   10 mg at 01/20/24 1413   And   diphenhydrAMINE  (BENADRYL ) injection 50 mg  50 mg Intramuscular TID PRN Bobbitt, Shalon E, NP   50 mg at 01/20/24 1414   And   LORazepam  (ATIVAN ) injection 2 mg  2 mg Intramuscular TID PRN Bobbitt, Shalon E, NP   2 mg at 01/26/24 2054   divalproex  (DEPAKOTE ) DR tablet 750 mg  750 mg Oral Q12H Eria Lozoya E, PA-C   750 mg at 02/02/24 9192   fluPHENAZine  (PROLIXIN ) tablet 5 mg  5 mg Oral TID Cleotilde Hoy HERO, NP   5 mg at 02/02/24 1238   hydrOXYzine  (ATARAX ) tablet 25 mg  25 mg Oral TID PRN Bobbitt, Shalon E, NP   25 mg at 02/01/24 2123   magnesium  hydroxide (MILK OF MAGNESIA) suspension 30 mL  30 mL Oral Daily PRN Bobbitt, Shalon E, NP       nicotine  (NICODERM CQ  - dosed in mg/24 hours) patch 14 mg  14 mg Transdermal Daily Jadapalle, Sree, MD   14 mg at 02/02/24 9193   nicotine  polacrilex (NICORETTE ) gum 2 mg  2 mg Oral PRN Jadapalle, Sree, MD   2 mg at 02/01/24 1828   ondansetron  (ZOFRAN -ODT) disintegrating tablet 4 mg  4 mg Oral Q8H PRN Cleotilde Hoy HERO, NP   4 mg at 02/01/24 2036   phenylephrine -shark liver oil-mineral oil-petrolatum  (PREPARATION H) rectal ointment 1 Application  1 Application Rectal BID PRN Khrystal Jeanmarie,  Micca Matura E, PA-C   1 Application at 01/19/24 2257   traZODone  (DESYREL ) tablet 50 mg  50 mg Oral QHS PRN Bobbitt, Shalon E, NP   50 mg at 02/01/24 2123    Lab Results:  No results found for this or any previous visit (from the past 48 hours).     Blood Alcohol level:  Lab Results  Component Value Date   [facility] <15 01/14/2024   ETH <10 05/07/2019    Metabolic Disorder Labs: Lab Results  Component Value Date   HGBA1C 5.1 01/18/2024   MPG 99.67 01/18/2024   MPG 105.41 05/11/2019   Lab Results  Component Value Date   PROLACTIN 24.1 (H) 05/11/2019   Lab Results  Component Value Date   CHOL 154 01/18/2024   TRIG 180 (H) 01/18/2024   HDL 49 01/18/2024   CHOLHDL 3.1 01/18/2024   VLDL 36 01/18/2024   LDLCALC 69 01/18/2024   LDLCALC 82 05/11/2019      Psychiatric Specialty Exam: Appearance: Disheveled, cooperative Behavior: Seen walking in hall, no tremor noted today Mood: Euthymic Affect: Bright, less labile than prior days Speech: Normal rate and tone Thought Process: Tangential, vague Thought Content: No delusions, hallucinations, SI, or HI Insight: Poor Judgment: Impaired Eye Contact: Adequate Attention: Intact with redirection Psychomotor Activity: Normal Orientation: Limited (continues to report conflicting information regarding residence) Sleep: WNL Appetite: WNL Memory, Concentration, Recall, Fund of Knowledge, Language: Mildly impaired or inconsistent   Musculoskeletal: Strength & Muscle Tone: within normal limits Gait & Station: normal    Physical Exam: Physical Exam Vitals and nursing note reviewed.  HENT:     Head: Atraumatic.  Eyes:     Extraocular Movements: Extraocular movements intact.  Pulmonary:     Effort: Pulmonary effort is normal.  Neurological:     Mental Status: She is alert and oriented to person, place, and time.    Review of Systems  Psychiatric/Behavioral:  Positive for hallucinations.    Blood pressure (!) 110/93, pulse  89, temperature (!) 97 F (36.1 C), resp. rate 20, height 5' 6 (1.676 m), weight 77.1 kg, SpO2 97%. Body mass index is 27.44 kg/m.   PLAN: Safety and Monitoring:  -- Voluntary admission to inpatient psychiatric unit for safety, stabilization and treatment  -- Daily contact with patient to assess and evaluate symptoms and progress in treatment  -- Patient's case to be discussed in multi-disciplinary team meeting  -- Observation Level : q15 minute checks  -- Vital signs:  q12 hours  -- Precautions: suicide, elopement, and assault -- Encouraged patient to participate in unit milieu and in scheduled group therapies   2. Psychiatric Diagnoses and Treatment:   Patient presentation initially appeared consistent with substance-induced mood disorder and psychosis. 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 Symptom profile suggests a chronic underlying psychotic process rather than a purely substance-induced syndrome. Differential remains between schizoaffective disorder vs. schizophrenia vs. bipolar disorder, and longitudinal data will help clarify.  Noted to be resting today with no abnormal movements noted, no somatic complaints today.   Patient continues to require this acute inpatient psychiatric setting for safety, medication management, and symptom stabilization to address need for continued treatment. Prolixin  has been added related to continued symptoms with plan to wean Zyprexa  in favor of typical antipsychotic in hopes of improved symptom control. VPA 01/30/24 64.   Psychiatric Diagnosis Schizoaffective Disorder vs. Schizophrenia (Primary Thought Disorder - under evaluation) Rule out: Bipolar I Disorder Substance-Induced Mood Disorder (initial consideration - now less likely) Substance-Induced Psychotic Disorder (initial  consideration - now less likely) Rule out: Somatic Symptom Disorder  Psychiatric Medications Prolixin  5 mg PO TID Depakote  750 mg PO BID (Depakote  level on 7/20 was 39; repeat VPA today 64 Zyprexa  5 mg PO QHS x2 more nights, then discontinue Decrease Cogentin  to 1 mg twice daily scheduled   3. Medical Issues Being Addressed:  7/28Prior concerns reviewed no new concerns discussed today.  No ongoing jaw swelling or tooth pain.  No concerns about kidney stone today.  Encouraged hydration.    7/27 Patient reports right-sided jaw swelling and tooth pain. No fever noted. Patient is allergic to Amoxicillin. Case discussed with Dr. Jadapalle. Plan initiated. Less swelling and no complaint of pain today.   Start Azithromycin  500 mg PO today, then 250 mg PO daily for 4 days Add salt water  swish and spit Will monitor for progression or acute worsening; outpatient dental referral as needed  Patient also verbalized concern regarding kidney stone pain, citing a strong family history and her own past experiences. However, there were no acute pain episodes noted by staff, and UA was obtained out of caution. Notably, on the following day, patient did not recall these complaints and instead became fixated on family history of Parkinson's disease in connection with the observed tremor in her right arm. Presentation raises concern for possible somatization, especially in the context of memory inconsistencies and shifting focus. 4. Discharge Planning:   -- Social work and case management to assist with discharge planning and identification of  hospital follow-up needs prior to discharge  -- Estimated LOS: 3-4 days  Donnice FORBES Right, PA-C 02/02/2024, 4:13 PM

## 2024-02-02 NOTE — Plan of Care (Signed)
?  Problem: Education: Goal: Knowledge of [facility] Education information/materials will improve Outcome: Progressing Goal: Emotional status will improve Outcome: Progressing Goal: Mental status will improve Outcome: Progressing   Problem: Coping: Goal: Ability to verbalize frustrations and anger appropriately will improve Outcome: Progressing Goal: Ability to demonstrate self-control will improve Outcome: Progressing   

## 2024-02-02 NOTE — Progress Notes (Signed)
   02/02/24 1300  Psych Admission Type (Psych Patients Only)  Admission Status Voluntary  Psychosocial Assessment  Patient Complaints None  Eye Contact Fair  Facial Expression Animated  Affect Anxious  Speech Logical/coherent  Interaction Assertive  Motor Activity Tremors  Appearance/Hygiene Disheveled  Behavior Characteristics Cooperative  Mood Pleasant;Anxious  Thought Process  Coherency WDL  Content WDL  Delusions None reported or observed  Perception WDL  Hallucination None reported or observed  Judgment Impaired  Confusion Mild  Danger to Self  Current suicidal ideation? Denies  Danger to Others  Danger to Others None reported or observed

## 2024-02-02 NOTE — Group Note (Signed)
 Date:  02/02/2024 Time:  8:30 PM  Group Topic/Focus:  Wellness Toolbox:   The focus of this group is to discuss various aspects of wellness, balancing those aspects and exploring ways to increase the ability to experience wellness.  Patients will create a wellness toolbox for use upon discharge.    Participation Level:  Active  Participation Quality:  Appropriate and Attentive  Affect:  Appropriate  Cognitive:  Alert and Appropriate  Insight: Appropriate and Good  Engagement in Group:  Developing/Improving and Engaged  Modes of Intervention:  Activity, Discussion, Education, Problem-solving, Rapport Building, and Support  Additional Comments:     [name] 02/02/2024, 8:30 PM

## 2024-02-02 NOTE — Group Note (Signed)
 Recreation Therapy Group Note   Group Topic:Stress Management  Group Date: 02/02/2024 Start Time: 1500 End Time: 1540 Facilitators: Celestia Jeoffrey BRAVO, LRT, CTRS Location: Dayroom  Group Description: Meditation. LRT and patients discussed what they know about meditation and mindfulness. LRT played a Deep Breathing Meditation exercise script for patients to follow along to. LRT and patients discussed how meditation and deep breathing can be used as a coping skill post--discharge to help manage symptoms of stress.   Goal Area(s) Addressed: Patient will practice using relaxation technique. Patient will identify a new coping skill.  Patient will follow multistep directions to reduce anxiety and stress.   Affect/Mood: N/A   Participation Level: Did not attend    Clinical Observations/Individualized Feedback: Patient did not attend group.   Plan: Continue to engage patient in RT group sessions 2-3x/week.   Jeoffrey BRAVO Celestia, LRT, CTRS 02/02/2024 4:19 PM

## 2024-02-02 NOTE — BH IP Treatment Plan (Signed)
 Interdisciplinary Treatment and Diagnostic Plan Update  02/02/2024 Time of Session: 14:00 Allison Bolton MRN: [id number]  Principal Diagnosis: Psychoactive substance-induced psychosis (HCC)  Secondary Diagnoses: Principal Problem:   Psychoactive substance-induced psychosis (HCC) Active Problems:   Bipolar disorder (HCC)   Substance induced mood disorder (HCC)   Current Medications:  Current Facility-Administered Medications  Medication Dose Route Frequency Provider Last Rate Last Admin   acetaminophen  (TYLENOL ) tablet 650 mg  650 mg Oral Q6H PRN Bobbitt, Shalon E, NP   650 mg at 02/01/24 2121   alum & mag hydroxide-simeth (MAALOX/MYLANTA) 200-200-20 MG/5ML suspension 30 mL  30 mL Oral Q4H PRN Bobbitt, Shalon E, NP   30 mL at 02/01/24 2143   benzocaine  (ORAJEL) 10 % mucosal gel   Mouth/Throat TID PRN Trudy Carwin, NP   Given at 02/01/24 1950   benztropine  (COGENTIN ) tablet 1 mg  1 mg Oral BID Millington, Matthew E, PA-C   1 mg at 02/02/24 9192   haloperidol  (HALDOL ) tablet 5 mg  5 mg Oral TID PRN Bobbitt, Shalon E, NP   5 mg at 01/25/24 2059   And   diphenhydrAMINE  (BENADRYL ) capsule 50 mg  50 mg Oral TID PRN Bobbitt, Shalon E, NP   50 mg at 02/01/24 2122   haloperidol  lactate (HALDOL ) injection 5 mg  5 mg Intramuscular TID PRN Bobbitt, Shalon E, NP   5 mg at 01/24/24 1430   And   diphenhydrAMINE  (BENADRYL ) injection 50 mg  50 mg Intramuscular TID PRN Bobbitt, Shalon E, NP   50 mg at 01/24/24 1431   And   LORazepam  (ATIVAN ) injection 2 mg  2 mg Intramuscular TID PRN Bobbitt, Shalon E, NP   2 mg at 01/24/24 1431   haloperidol  lactate (HALDOL ) injection 10 mg  10 mg Intramuscular TID PRN Bobbitt, Shalon E, NP   10 mg at 01/20/24 1413   And   diphenhydrAMINE  (BENADRYL ) injection 50 mg  50 mg Intramuscular TID PRN Bobbitt, Shalon E, NP   50 mg at 01/20/24 1414   And   LORazepam  (ATIVAN ) injection 2 mg  2 mg Intramuscular TID PRN Bobbitt, Shalon E, NP   2 mg at 01/26/24 2054    divalproex  (DEPAKOTE ) DR tablet 750 mg  750 mg Oral Q12H Millington, Matthew E, PA-C   750 mg at 02/02/24 9192   fluPHENAZine  (PROLIXIN ) tablet 5 mg  5 mg Oral TID Cleotilde Hoy HERO, NP   5 mg at 02/02/24 1238   hydrOXYzine  (ATARAX ) tablet 25 mg  25 mg Oral TID PRN Bobbitt, Shalon E, NP   25 mg at 02/01/24 2123   magnesium  hydroxide (MILK OF MAGNESIA) suspension 30 mL  30 mL Oral Daily PRN Bobbitt, Shalon E, NP       nicotine  (NICODERM CQ  - dosed in mg/24 hours) patch 14 mg  14 mg Transdermal Daily Jadapalle, Sree, MD   14 mg at 02/02/24 9193   nicotine  polacrilex (NICORETTE ) gum 2 mg  2 mg Oral PRN Jadapalle, Sree, MD   2 mg at 02/01/24 1828   ondansetron  (ZOFRAN -ODT) disintegrating tablet 4 mg  4 mg Oral Q8H PRN Cleotilde Hoy HERO, NP   4 mg at 02/01/24 2036   phenylephrine -shark liver oil-mineral oil-petrolatum  (PREPARATION H) rectal ointment 1 Application  1 Application Rectal BID PRN Millington, Matthew E, PA-C   1 Application at 01/19/24 2257   traZODone  (DESYREL ) tablet 50 mg  50 mg Oral QHS PRN Bobbitt, Shalon E, NP   50 mg at 02/01/24 2123  PTA Medications: Medications Prior to Admission  Medication Sig Dispense Refill Last Dose/Taking   acetaminophen  (TYLENOL ) 325 MG tablet Take 650 mg by mouth every 6 (six) hours as needed.      albuterol  (VENTOLIN  HFA) 108 (90 Base) MCG/ACT inhaler Inhale 2 puffs into the lungs every 4 (four) hours as needed for wheezing or shortness of breath.      benztropine  (COGENTIN ) 1 MG tablet Take 1 tablet (1 mg total) by mouth 2 (two) times daily. 60 tablet 2    carvedilol  (COREG ) 25 MG tablet Take 1 tablet (25 mg total) by mouth 2 (two) times daily with a meal. 60 tablet 3    divalproex  (DEPAKOTE ) 250 MG DR tablet Take 3 tablets (750 mg total) by mouth every evening. 90 tablet 2    gabapentin  (NEURONTIN ) 100 MG capsule Take 100 mg by mouth 3 (three) times daily.      hydrOXYzine  (ATARAX ) 25 MG tablet Take 25 mg by mouth every 8 (eight) hours as needed for  anxiety.      linaclotide  (LINZESS ) 72 MCG capsule Take 1 capsule (72 mcg total) by mouth daily before breakfast. 90 capsule 3    losartan (COZAAR) 25 MG tablet Take 25 mg by mouth daily.      OLANZapine  (ZYPREXA ) 10 MG tablet Take 10 mg by mouth at bedtime.      ondansetron  (ZOFRAN ) 4 MG tablet Take 4 mg by mouth every 6 (six) hours as needed.      ondansetron  (ZOFRAN -ODT) 4 MG disintegrating tablet Take 4 mg by mouth every 8 (eight) hours as needed.      QUEtiapine  (SEROQUEL ) 300 MG tablet Take 300 mg by mouth at bedtime.      tamsulosin (FLOMAX) 0.4 MG CAPS capsule Take 0.4 mg by mouth daily.      traZODone  (DESYREL ) 100 MG tablet Take 2 tablets (200 mg total) by mouth at bedtime as needed for sleep. 60 tablet 2     Patient Stressors: Financial difficulties   Health problems   Occupational concerns   Substance abuse   Traumatic event    Patient Strengths: Motivation for treatment/growth  Supportive family/friends   Treatment Modalities: Medication Management, Group therapy, Case management,  1 to 1 session with clinician, Psychoeducation, Recreational therapy.   Physician Treatment Plan for Primary Diagnosis: Psychoactive substance-induced psychosis (HCC) Long Term Goal(s): Improvement in symptoms so as ready for discharge   Short Term Goals: Ability to identify changes in lifestyle to reduce recurrence of condition will improve  Medication Management: Evaluate patient's response, side effects, and tolerance of medication regimen.  Therapeutic Interventions: 1 to 1 sessions, Unit Group sessions and Medication administration.  Evaluation of Outcomes: Progressing  Physician Treatment Plan for Secondary Diagnosis: Principal Problem:   Psychoactive substance-induced psychosis (HCC) Active Problems:   Bipolar disorder (HCC)   Substance induced mood disorder (HCC)  Long Term Goal(s): Improvement in symptoms so as ready for discharge   Short Term Goals: Ability to identify  changes in lifestyle to reduce recurrence of condition will improve     Medication Management: Evaluate patient's response, side effects, and tolerance of medication regimen.  Therapeutic Interventions: 1 to 1 sessions, Unit Group sessions and Medication administration.  Evaluation of Outcomes: Progressing   RN Treatment Plan for Primary Diagnosis: Psychoactive substance-induced psychosis (HCC) Long Term Goal(s): Knowledge of disease and therapeutic regimen to maintain health will improve  Short Term Goals: Ability to remain free from injury will improve, Ability to verbalize frustration and anger  appropriately will improve, Ability to demonstrate self-control, Ability to participate in decision making will improve, Ability to verbalize feelings will improve, Ability to disclose and discuss suicidal ideas, Ability to identify and develop effective coping behaviors will improve, and Compliance with prescribed medications will improve  Medication Management: RN will administer medications as ordered by provider, will assess and evaluate patient's response and provide education to patient for prescribed medication. RN will report any adverse and/or side effects to prescribing provider.  Therapeutic Interventions: 1 on 1 counseling sessions, Psychoeducation, Medication administration, Evaluate responses to treatment, Monitor vital signs and CBGs as ordered, Perform/monitor CIWA, COWS, AIMS and Fall Risk screenings as ordered, Perform wound care treatments as ordered.  Evaluation of Outcomes: Progressing   LCSW Treatment Plan for Primary Diagnosis: Psychoactive substance-induced psychosis (HCC) Long Term Goal(s): Safe transition to appropriate next level of care at discharge, Engage patient in therapeutic group addressing interpersonal concerns.  Short Term Goals: Engage patient in aftercare planning with referrals and resources, Increase social support, Increase ability to appropriately verbalize  feelings, Increase emotional regulation, Facilitate acceptance of mental health diagnosis and concerns, Facilitate patient progression through stages of change regarding substance use diagnoses and concerns, Identify triggers associated with mental health/substance abuse issues, and Increase skills for wellness and recovery  Therapeutic Interventions: Assess for all discharge needs, 1 to 1 time with Social worker, Explore available resources and support systems, Assess for adequacy in community support network, Educate family and significant other(s) on suicide prevention, Complete Psychosocial Assessment, Interpersonal group therapy.  Evaluation of Outcomes: Progressing   Progress in Treatment: Attending groups: Yes. and No. Participating in groups: Yes. Taking medication as prescribed: Yes. Toleration medication: Yes. Family/Significant other contact made: Yes, individual(s) contacted:  aunt, Winton Sheller.  Patient understands diagnosis: Yes. Discussing patient identified problems/goals with staff: Yes. Medical problems stabilized or resolved: Yes. Denies suicidal/homicidal ideation: Yes. Issues/concerns per patient self-inventory: No. Other: none.   New problem(s) identified: No, Describe:  none identified. Update 01/23/24: No changes at this time. Update 01/28/24: No changes at this time. Update 02/02/24: No changes at this time.    New Short Term/Long Term Goal(s): detox, elimination of symptoms of psychosis, medication management for mood stabilization; elimination of SI thoughts; development of comprehensive mental wellness/sobriety plan.   Update 01/23/24: No changes at this time. Update 01/28/24: No changes at this time. Update 02/02/24: No changes at this time.   Patient Goals: Work with everybody.    Update 01/23/24: No changes at this time. Update 01/28/24: No changes at this time. Update 02/02/24: No changes at this time.   Discharge Plan or Barriers: CSW will assist pt with  development of an appropriate aftercare/discharge plan.   Update 01/23/24: No changes at this time. Update 01/28/24: Contacts have been made to family without result. Update 02/02/24: CSW was able to get in touch with a cousin who shared that pt can come stay with him upon discharge.    Reason for Continuation of Hospitalization: Medication stabilization Other; describe disorganized thought processes, and bizarre behavior.    Estimated Length of Stay: 1-7 days   Update 01/23/24: TBD Update 01/28/24: TBD Update 02/02/24:  TBD  Last 3 [place] Suicide Severity Risk Score: Flowsheet Row Admission (Current) from 01/15/2024 in [facility] ED from 01/14/2024 in [facility] Emergency Department at [facility] Admission (Discharged) from 12/25/2023 in [place] [facility]  C-SSRS RISK CATEGORY No Risk No Risk No Risk    Last PHQ 2/9 Scores:  No data to display          Scribe for Treatment Team: [name], KEN 02/02/2024 3:25 PM

## 2024-02-02 NOTE — Plan of Care (Signed)
  Problem: Education: Goal: Emotional status will improve Outcome: Progressing Goal: Mental status will improve Outcome: Progressing   Problem: Coping: Goal: Ability to verbalize frustrations and anger appropriately will improve Outcome: Progressing   Problem: Health Behavior/Discharge Planning: Goal: Compliance with treatment plan for underlying cause of condition will improve Outcome: Progressing   Problem: Physical Regulation: Goal: Ability to maintain clinical measurements within normal limits will improve Outcome: Progressing   Problem: Safety: Goal: Periods of time without injury will increase Outcome: Progressing

## 2024-02-02 NOTE — Progress Notes (Signed)
   02/01/24 2200  Psych Admission Type (Psych Patients Only)  Admission Status Voluntary  Psychosocial Assessment  Patient Complaints Anxiety;Insomnia;Nervousness;Restlessness  Eye Contact Fair  Facial Expression Anxious;Animated  Affect Anxious  Speech Logical/coherent  Interaction Assertive;Needy  Motor Activity Tremors  Appearance/Hygiene Disheveled  Behavior Characteristics Cooperative;Anxious  Mood Anxious  Thought Process  Coherency Loose associations  Content Preoccupation  Delusions None reported or observed  Perception WDL  Hallucination None reported or observed  Judgment Impaired  Confusion Mild  Danger to Self  Current suicidal ideation? Denies  Agreement Not to Harm Self Yes  Description of Agreement verbal  Danger to Others  Danger to Others None reported or observed

## 2024-02-02 NOTE — Progress Notes (Signed)
 Pt endorses anxiety, denies SI/HI/AVH. Pt observed by this Clinical research associate interacting appropriately with staff and peers on the unit. Pt compliant with medication administration per MD orders. Pt given education, support, and encouragement to be active in her treatment plan. Pt being monitored Q 15 minutes for safety per unit protocol, remains safe on the unit

## 2024-02-02 NOTE — Group Note (Signed)
 Date:  02/02/2024 Time:  10:38 AM  Group Topic/Focus:  Stages of Change:   The focus of this group is to explain the stages of change and help patients identify changes they want to make upon discharge.    Participation Level:  Did Not Attend   Allison Bolton Allison Bolton 02/02/2024, 10:38 AM

## 2024-02-03 LAB — VALPROIC ACID LEVEL: Valproic Acid Lvl: 63 ug/mL (ref 50–100)

## 2024-02-03 MED ORDER — TRAZODONE HCL 50 MG PO TABS: 50.0000 mg | ORAL_TABLET | Freq: Every evening | ORAL | 0 refills | Status: DC | PRN

## 2024-02-03 MED ORDER — BENZTROPINE MESYLATE 1 MG PO TABS: 1.0000 mg | ORAL_TABLET | Freq: Two times a day (BID) | ORAL | 0 refills | Status: DC

## 2024-02-03 MED ORDER — FLUPHENAZINE HCL 5 MG PO TABS: 5.0000 mg | ORAL_TABLET | Freq: Three times a day (TID) | ORAL | 0 refills | Status: DC

## 2024-02-03 MED ORDER — DIVALPROEX SODIUM 250 MG PO DR TAB: 750.0000 mg | DELAYED_RELEASE_TABLET | Freq: Two times a day (BID) | ORAL | 0 refills | Status: DC

## 2024-02-03 MED ORDER — NICOTINE 14 MG/24HR TD PT24: 14.0000 mg | MEDICATED_PATCH | Freq: Every day | TRANSDERMAL | 0 refills | Status: DC

## 2024-02-03 MED ORDER — HYDROXYZINE HCL 25 MG PO TABS: 25.0000 mg | ORAL_TABLET | Freq: Three times a day (TID) | ORAL | 0 refills | Status: DC | PRN

## 2024-02-03 NOTE — Plan of Care (Signed)

## 2024-02-03 NOTE — Group Note (Signed)
 Date:  02/03/2024 Time:  8:52 PM  Group Topic/Focus:  Wrap-Up Group:   The focus of this group is to help patients review their daily goal of treatment and discuss progress on daily workbooks. We went over rules and expectations for the unit and explained how the evenings go what time med are given, what time the dayrooms close and open, 15 minute checks, 6 am vitals and other reminders.    Participation Level:  Active  Participation Quality:  Appropriate  Affect:  Appropriate  Cognitive:  Appropriate  Insight: Appropriate  Engagement in Group:  Engaged  Modes of Intervention:  Discussion  Additional Comments:    Leigh VEAR Pais 02/03/2024, 8:52 PM

## 2024-02-03 NOTE — Progress Notes (Signed)
   02/03/24 1618  Vital Signs  Temp (!) 97.5 F (36.4 C)  Pulse Rate 82  BP (!) 100/45  BP Location Left Arm  BP Method Automatic  Patient Position (if appropriate) Sitting  Oxygen Therapy  SpO2 98 %  O2 Device Room Air   Patient encouraged to increase her fluid intake, given a Gatorade for BP support, and was given her dinner tray. BP will be rechecked after dinner.

## 2024-02-03 NOTE — Plan of Care (Signed)
   Problem: Education: Goal: Emotional status will improve Outcome: Progressing Goal: Mental status will improve Outcome: Progressing

## 2024-02-03 NOTE — Progress Notes (Signed)
   02/03/24 0815  Psych Admission Type (Psych Patients Only)  Admission Status Voluntary  Psychosocial Assessment  Patient Complaints Other (Comment) (back pain)  Eye Contact Fair;Watchful  Facial Expression Fixed smile  Affect Anxious  Speech Logical/coherent  Interaction Assertive  Motor Activity Slow;Tremors  Appearance/Hygiene Body odor;Disheveled;Poor hygiene  Behavior Characteristics Cooperative;Appropriate to situation  Mood Anxious;Pleasant  Aggressive Behavior  Effect No apparent injury  Thought Process  Coherency WDL  Content WDL  Delusions None reported or observed  Perception WDL  Hallucination None reported or observed  Judgment WDL  Confusion None  Danger to Self  Current suicidal ideation? Denies  Agreement Not to Harm Self Yes  Description of Agreement Verbal  Danger to Others  Danger to Others None reported or observed   Patient's goal for today is to take my meds.

## 2024-02-03 NOTE — Group Note (Signed)
 LCSW Group Therapy Note   Group Date: 02/02/2024 Start Time: 1300 End Time: 1400   Type of Therapy and Topic:  Group Therapy: Challenging Core Beliefs  Participation Level:  Did Not Attend  Description of Group:  Patients were educated about core beliefs and asked to identify one harmful core belief that they have. Patients were asked to explore from where those beliefs originate. Patients were asked to discuss how those beliefs make them feel and the resulting behaviors of those beliefs. They were then be asked if those beliefs are true and, if so, what evidence they have to support them. Lastly, group members were challenged to replace those negative core beliefs with helpful beliefs.   Therapeutic Goals:   1. Patient will identify harmful core beliefs and explore the origins of such beliefs. 2. Patient will identify feelings and behaviors that result from those core beliefs. 3. Patient will discuss whether such beliefs are true. 4.  Patient will replace harmful core beliefs with helpful ones.  Summary of Patient Progress:  Patient did not attend group.   Therapeutic Modalities: Cognitive Behavioral Therapy; Solution-Focused Therapy   Alany Borman M Josslyn Ciolek, LCSWA 02/03/2024  8:46 AM

## 2024-02-03 NOTE — Group Note (Signed)
 Date:  02/03/2024 Time:  7:30 PM  Group Topic/Focus:  Activity Group:  The focus of the group is to promote activity for the patients and encourage them to go outside to the courtyard and get some fresh air and some exercise.    Participation Level:  Active  Participation Quality:  Appropriate  Affect:  Appropriate  Cognitive:  Appropriate  Insight: Appropriate  Engagement in Group:  Engaged  Modes of Intervention:  Activity  Additional Comments:    Camellia HERO Eunice Oldaker 02/03/2024, 7:30 PM

## 2024-02-03 NOTE — Group Note (Signed)
 Date:  02/03/2024 Time:  5:48 PM  Group Topic/Focus:  Early Warning Signs:   The focus of this group is to help patients identify signs or symptoms they exhibit before slipping into an unhealthy state or crisis.    Participation Level:  Did Not Attend   Allison Bolton 02/03/2024, 5:48 PM

## 2024-02-03 NOTE — Progress Notes (Signed)
 [facility] MD Progress Note  02/03/2024 4:21 PM Allison Bolton  MRN:  [id number]  53 year old female with medical history of asthma, fibromyalgia, diabetes mellitus, ovarian and cervical cancer, possible colon cancer, and Mnire's disease, as well as psychiatric history of bipolar disorder and polysubstance abuse, presented to the emergency department with bizarre and chaotic behavior, gross disorganization, and positive urine drug screen for benzodiazepines, amphetamines, and THC. She was administered Geodon  and Ativan  in the ED.   Subjective:  Chart reviewed, case discussed in multidisciplinary meeting, patient seen during rounds.  Patient seen today for follow-up.  They are alert and oriented.  They are pleasant and cooperative on exam.  No involuntary movement observed.  Plan for discharge tomorrow.  They deny SI, HI, and AVH.  They demonstrate good insight into the need for outpatient follow-up and medication compliance.  They are performing ADLs.  They are observed participating in the unit milieu.  They rate depression at 0 out of 10.  They rated anxiety at 3 out of 10.  Plan is for discharge tomorrow to the family members house.    Will continue current medication regimen .Continued monitoring of cognitive status, mood, and orientation. Will coordinate with social work for discharge planning   Sleep: good   Appetite:  Fair  Past Psychiatric History: see h&P Family History:  Family History  Problem Relation Age of Onset   Thyroid  disease Mother    Colon cancer Father    Breast cancer Maternal Aunt    Colon cancer Brother    Colon cancer Other        paternal and maternal grandfather   Meniere's disease Other    Anesthesia problems Neg Hx    Hypotension Neg Hx    Malignant hyperthermia Neg Hx    Pseudochol deficiency Neg Hx    Social History:  Social History   Substance and Sexual Activity  Alcohol Use No   Comment: not since June 2012     Social History   Substance and  Sexual Activity  Drug Use No   Comment: patient denies any-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    Social History   Socioeconomic History   Marital status: Legally Separated    Spouse name: Not on file   Number of children: Not on file   Years of education: Not on file   Highest education level: Not on file  Occupational History   Not on file  Tobacco Use   Smoking status: Every Day    Current packs/day: 0.00    Average packs/day: 0.5 packs/day for 37.3 years (18.7 ttl pk-yrs)    Types: Cigarettes    Start date: 07/07/1981    Last attempt to quit: 11/05/2018    Years since quitting: 5.2   Smokeless tobacco: Never  Vaping Use   Vaping status: Never Used  Substance and Sexual Activity   Alcohol use: No    Comment: not since June 2012   Drug use: No    Comment: patient denies any-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   Sexual activity: Never    Birth control/protection: Surgical  Other Topics Concern   Not on file  Social History Narrative   Not on file   Social Drivers of Health   Financial Resource Strain: Not on file  Food Insecurity: Food Insecurity Present (01/15/2024)   Hunger Vital Sign    Worried About Running Out of Food in the Last Year: Often true  Ran Out of Food in the Last Year: Often true  Transportation Needs: Unmet Transportation Needs (01/15/2024)   PRAPARE - Administrator, Civil Service (Medical): Yes    Lack of Transportation (Non-Medical): Yes  Physical Activity: Not on file  Stress: Not on file  Social Connections: Unknown (10/14/2021)   Received from [facility]   Social Connections    Do your friends and family support you?: Not on file    What agencies support you?: Not on file   Past Medical History:  Past Medical History:  Diagnosis Date   Arthritis    Asthma    Bipolar 1 disorder (HCC)    Diverticulosis    Dysrhythmia    sts I have heart palpitations   Fibromyalgia    H/O hiatal hernia    4    Headache(784.0)    High blood pressure    Meniere's disease    S/P colonoscopy    Dr. Golda 2010: few small diverticula at sigmoid. otherwise normal.    Shortness of breath     Past Surgical History:  Procedure Laterality Date   ABDOMINAL HYSTERECTOMY     APPENDECTOMY     CESAREAN SECTION     CHOLECYSTECTOMY     COLONOSCOPY N/A 12/25/2023   Procedure: COLONOSCOPY;  Surgeon: Eartha Angelia Sieving, MD;  Location: [facility];  Service: Gastroenterology;  Laterality: N/A;  11:30am, asa 1   exploratory laparoscopy     FOOT SURGERY     HEMORRHOID SURGERY     LAPAROSCOPIC APPENDECTOMY  02/19/2011   Procedure: APPENDECTOMY LAPAROSCOPIC;  Surgeon: Oneil DELENA Budge;  Location: [facility] ORS;  Service: General;  Laterality: N/A;   LAPAROSCOPY  02/19/2011   Procedure: LAPAROSCOPY DIAGNOSTIC;  Surgeon: Oneil DELENA Budge;  Location: [facility] OR[facility];  Service: General;  Laterality: N/A;   left ovarian removal     multiple hernia repairs     Right ovarian removal     TONSILLECTOMY     TONSILLECTOMY AND ADENOIDECTOMY     tubes in ears     UMBILICAL HERNIA REPAIR  Dec 2011   Dr. Budge   wisdom teeth removal      Current Medications: Current Facility-Administered Medications  Medication Dose Route Frequency Provider Last Rate Last Admin   acetaminophen  (TYLENOL ) tablet 650 mg  650 mg Oral Q6H PRN Bobbitt, Shalon E, NP   650 mg at 02/03/24 0815   alum & mag hydroxide-simeth (MAALOX/MYLANTA) 200-200-20 MG/5ML suspension 30 mL  30 mL Oral Q4H PRN Bobbitt, Shalon E, NP   30 mL at 02/01/24 2143   benzocaine  (ORAJEL) 10 % mucosal gel   Mouth/Throat TID PRN Trudy Carwin, NP   Given at 02/01/24 1950   benztropine  (COGENTIN ) tablet 1 mg  1 mg Oral BID Emmalynn Pinkham E, PA-C   1 mg at 02/03/24 0815   haloperidol  (HALDOL ) tablet 5 mg  5 mg Oral TID PRN Bobbitt, Shalon E, NP   5 mg at 01/25/24 2059   And   diphenhydrAMINE  (BENADRYL ) capsule 50 mg  50 mg Oral TID PRN Bobbitt, Shalon E, NP   50 mg at 02/02/24  2033   haloperidol  lactate (HALDOL ) injection 5 mg  5 mg Intramuscular TID PRN Bobbitt, Shalon E, NP   5 mg at 01/24/24 1430   And   diphenhydrAMINE  (BENADRYL ) injection 50 mg  50 mg Intramuscular TID PRN Bobbitt, Shalon E, NP   50 mg at 01/24/24 1431   And   LORazepam  (  ATIVAN ) injection 2 mg  2 mg Intramuscular TID PRN Bobbitt, Shalon E, NP   2 mg at 01/24/24 1431   haloperidol  lactate (HALDOL ) injection 10 mg  10 mg Intramuscular TID PRN Bobbitt, Shalon E, NP   10 mg at 01/20/24 1413   And   diphenhydrAMINE  (BENADRYL ) injection 50 mg  50 mg Intramuscular TID PRN Bobbitt, Shalon E, NP   50 mg at 01/20/24 1414   And   LORazepam  (ATIVAN ) injection 2 mg  2 mg Intramuscular TID PRN Bobbitt, Shalon E, NP   2 mg at 01/26/24 2054   divalproex  (DEPAKOTE ) DR tablet 750 mg  750 mg Oral Q12H Roselia Snipe E, PA-C   750 mg at 02/03/24 0815   fluPHENAZine  (PROLIXIN ) tablet 5 mg  5 mg Oral TID Cleotilde Hoy HERO, NP   5 mg at 02/03/24 1240   hydrOXYzine  (ATARAX ) tablet 25 mg  25 mg Oral TID PRN Bobbitt, Shalon E, NP   25 mg at 02/03/24 0815   magnesium  hydroxide (MILK OF MAGNESIA) suspension 30 mL  30 mL Oral Daily PRN Bobbitt, Shalon E, NP       nicotine  (NICODERM CQ  - dosed in mg/24 hours) patch 14 mg  14 mg Transdermal Daily Jadapalle, Sree, MD   14 mg at 02/03/24 9183   nicotine  polacrilex (NICORETTE ) gum 2 mg  2 mg Oral PRN Jadapalle, Sree, MD   2 mg at 02/02/24 1843   ondansetron  (ZOFRAN -ODT) disintegrating tablet 4 mg  4 mg Oral Q8H PRN Cleotilde Hoy HERO, NP   4 mg at 02/02/24 2033   phenylephrine -shark liver oil-mineral oil-petrolatum  (PREPARATION H) rectal ointment 1 Application  1 Application Rectal BID PRN Wilver Tignor E, PA-C   1 Application at 01/19/24 2257   traZODone  (DESYREL ) tablet 50 mg  50 mg Oral QHS PRN Bobbitt, Shalon E, NP   50 mg at 02/02/24 2033    Lab Results:  No results found for this or any previous visit (from the past 48 hours).     Blood Alcohol level:   Lab Results  Component Value Date   [facility] <15 01/14/2024   ETH <10 05/07/2019    Metabolic Disorder Labs: Lab Results  Component Value Date   HGBA1C 5.1 01/18/2024   MPG 99.67 01/18/2024   MPG 105.41 05/11/2019   Lab Results  Component Value Date   PROLACTIN 24.1 (H) 05/11/2019   Lab Results  Component Value Date   CHOL 154 01/18/2024   TRIG 180 (H) 01/18/2024   HDL 49 01/18/2024   CHOLHDL 3.1 01/18/2024   VLDL 36 01/18/2024   LDLCALC 69 01/18/2024   LDLCALC 82 05/11/2019      Psychiatric Specialty Exam: Appearance: Disheveled, cooperative Behavior: Seen walking in hall, no tremor noted today Mood: Euthymic Affect: Bright, less labile than prior days Speech: Normal rate and tone Thought Process: Tangential, vague Thought Content: No delusions, hallucinations, SI, or HI Insight: Poor Judgment: Impaired Eye Contact: Adequate Attention: Intact with redirection Psychomotor Activity: Normal Orientation: Limited (continues to report conflicting information regarding residence) Sleep: WNL Appetite: WNL Memory, Concentration, Recall, Fund of Knowledge, Language: Mildly impaired or inconsistent   Musculoskeletal: Strength & Muscle Tone: within normal limits Gait & Station: normal    Physical Exam: Physical Exam Vitals and nursing note reviewed.  HENT:     Head: Atraumatic.  Eyes:     Extraocular Movements: Extraocular movements intact.  Pulmonary:     Effort: Pulmonary effort is normal.  Neurological:  Mental Status: She is alert and oriented to person, place, and time.    Review of Systems  Psychiatric/Behavioral:  Positive for hallucinations.    Blood pressure (!) 100/45, pulse 82, temperature (!) 97.5 F (36.4 C), resp. rate 18, height 5' 6 (1.676 m), weight 77.1 kg, SpO2 98%. Body mass index is 27.44 kg/m.   PLAN: Safety and Monitoring:  -- Voluntary admission to inpatient psychiatric unit for safety, stabilization and treatment  -- Daily  contact with patient to assess and evaluate symptoms and progress in treatment  -- Patient's case to be discussed in multi-disciplinary team meeting  -- Observation Level : q15 minute checks  -- Vital signs:  q12 hours  -- Precautions: suicide, elopement, and assault -- Encouraged patient to participate in unit milieu and in scheduled group therapies   2. Psychiatric Diagnoses and Treatment:   Patient doing well plan for discharge tomorrow valproic  acid level scheduled for tonight.  Will continue current medications without adjustments.  Psychiatric Diagnosis Schizoaffective Disorder vs. Schizophrenia (Primary Thought Disorder - under evaluation) Rule out: Bipolar I Disorder Substance-Induced Mood Disorder (initial consideration - now less likely) Substance-Induced Psychotic Disorder (initial consideration - now less likely) Rule out: Somatic Symptom Disorder  Psychiatric Medications Prolixin  5 mg PO TID Depakote  750 mg PO BID (Depakote  level on 7/20 was 39; repeat VPA today 64 Zyprexa  was discontinued Decrease Cogentin  to 1 mg twice daily scheduled   3. Medical Issues Being Addressed:  7/28Prior concerns reviewed no new concerns discussed today.  No ongoing jaw swelling or tooth pain.  No concerns about kidney stone today.  Encouraged hydration.    7/27 Patient reports right-sided jaw swelling and tooth pain. No fever noted. Patient is allergic to Amoxicillin. Case discussed with Dr. Jadapalle. Plan initiated. Less swelling and no complaint of pain today.   Start Azithromycin  500 mg PO today, then 250 mg PO daily for 4 days Add salt water  swish and spit Will monitor for progression or acute worsening; outpatient dental referral as needed  Patient also verbalized concern regarding kidney stone pain, citing a strong family history and her own past experiences. However, there were no acute pain episodes noted by staff, and UA was obtained out of caution. Notably, on the following  day, patient did not recall these complaints and instead became fixated on family history of Parkinson's disease in connection with the observed tremor in her right arm. Presentation raises concern for possible somatization, especially in the context of memory inconsistencies and shifting focus. 4. Discharge Planning:   -- Social work and case management to assist with discharge planning and identification of hospital follow-up needs prior to discharge  -- Estimated LOS: 3-4 days  Donnice FORBES Right, PA-C 02/03/2024, 4:21 PM

## 2024-02-03 NOTE — BHH Counselor (Signed)
 CSW spoke with pt regarding discharge. Pt plans to discharge to a cousin's home and will need assistance with transportation. Pt already set up with [facility] for continued outpatient services. She reported that she is a smoker but denied interest in cessation. Pt denied any use of substances, despite UDS being positive for amphetamines, benzodiazepines, and cannabis. No other concerns expressed. Contact ended without incident.   CSW phone cousin, Lynwood Slade [phone]). He confirmed that pt can return to his home upon discharge. No other concerns expressed. Contact ended without incident.   Nadara SAUNDERS. Chaim, MSW, LCSW, LCAS 02/03/2024 1:40 PM

## 2024-02-03 NOTE — Group Note (Signed)
 LCSW Group Therapy Note  Group Date: 02/03/2024 Start Time: 1300 End Time: 1400   Type of Therapy and Topic:  Group Therapy: Anger Cues and Responses  Participation Level:  Did Not Attend   Description of Group:   In this group, patients learned how to recognize the physical, cognitive, emotional, and behavioral responses they have to anger-provoking situations.  They identified a recent time they became angry and how they reacted.  They analyzed how their reaction was possibly beneficial and how it was possibly unhelpful.  The group discussed a variety of healthier coping skills that could help with such a situation in the future.  Focus was placed on how helpful it is to recognize the underlying emotions to our anger, because working on those can lead to a more permanent solution as well as our ability to focus on the important rather than the urgent.  Therapeutic Goals: Patients will remember their last incident of anger and how they felt emotionally and physically, what their thoughts were at the time, and how they behaved. Patients will identify how their behavior at that time worked for them, as well as how it worked against them. Patients will explore possible new behaviors to use in future anger situations. Patients will learn that anger itself is normal and cannot be eliminated, and that healthier reactions can assist with resolving conflict rather than worsening situations.  Summary of Patient Progress:   X  Therapeutic Modalities:   Cognitive Behavioral Therapy    Sherryle JINNY Margo, LCSW 02/03/2024  3:17 PM

## 2024-02-03 NOTE — Progress Notes (Signed)
   02/03/24 1737  Vital Signs  Pulse Rate (!) 122  BP 117/84  BP Location Left Arm  BP Method Automatic  Patient Position (if appropriate) Sitting   Patient BP re-check, after dinner and Gatorade.

## 2024-02-04 ENCOUNTER — Encounter (HOSPITAL_COMMUNITY)

## 2024-02-04 NOTE — Progress Notes (Signed)
 Patient denies SI/I/AVH at this time. Discharge instructions, AVS, prescriptions, and transition record reviewed with patient. Patient agrees to comply with medication management, follow-up visit and outpatient therapy. Patient belongings returned to patient. Patient questions and concerns addressed and answered.  Patient ambulatory off unit. Patient discharged via Taxi to family's home.

## 2024-02-04 NOTE — Group Note (Signed)
 [facility] LCSW Group Therapy Note   Group Date: 02/04/2024 Start Time: 1300 End Time: 1400   Type of Therapy/Topic:  Group Therapy:  Balance in Life  Participation Level:  Did Not Attend   Description of Group:    This group will address the concept of balance and how it feels and looks when one is unbalanced. Patients will be encouraged to process areas in their lives that are out of balance, and identify reasons for remaining unbalanced. Facilitators will guide patients utilizing problem- solving interventions to address and correct the stressor making their life unbalanced. Understanding and applying boundaries will be explored and addressed for obtaining  and maintaining a balanced life. Patients will be encouraged to explore ways to assertively make their unbalanced needs known to significant others in their lives, using other group members and facilitator for support and feedback.  Therapeutic Goals: Patient will identify two or more emotions or situations they have that consume much of in their lives. Patient will identify signs/triggers that life has become out of balance:  Patient will identify two ways to set boundaries in order to achieve balance in their lives:  Patient will demonstrate ability to communicate their needs through discussion and/or role plays  Summary of Patient Progress: X   Therapeutic Modalities:   Cognitive Behavioral Therapy Solution-Focused Therapy Assertiveness Training   [name], LCSW

## 2024-02-04 NOTE — Care Management Important Message (Signed)
 Important Message  Patient Details  Name: Allison Bolton MRN: [id number] Date of Birth: January 31, 1971   Medicare Important Message Given:  Yes - Medicare IM     [name], LCSW 02/04/2024, 10:20 AM

## 2024-02-04 NOTE — Discharge Summary (Addendum)
 Physician Discharge Summary Note  Patient:  Allison Bolton is an 53 y.o., female MRN:  [id number] DOB:  1971-04-27 Patient phone:  [phone] (home)  Patient address:   [address] [place] [address],   Total time spent 35 minutes Date of Admission:  01/15/2024 Date of Discharge: 02/04/24  Reason for Admission:   The patient is a 53 year old female with a medical history significant for asthma, fibromyalgia, diabetes mellitus, ovarian and cervical cancer, possible colon cancer, and Mnire's disease, and a psychiatric history of bipolar disorder and polysubstance abuse. She presented to the emergency department with bizarre and chaotic behavior, gross disorganization, and a positive urine drug screen for benzodiazepines, amphetamines, and THC. On arrival, she was nonsensical, hyperfocused on tangential topics, emotionally labile, and unable to provide a coherent or reliable history. She displayed severely impaired judgment and insight, raising concerns for acute psychiatric decompensation likely exacerbated by recent substance use. Due to her unpredictable mental state, lack of insight, and inability to safely care for herself, she met criteria for involuntary inpatient psychiatric admission for stabilization, medication management, and safety monitoring.  Principal Problem: Psychoactive substance-induced psychosis (HCC) Discharge Diagnoses: Principal Problem:   Psychoactive substance-induced psychosis (HCC) Active Problems:   Bipolar disorder (HCC)   Substance induced mood disorder (HCC)   Past Psychiatric History: See h and p  Family Psychiatric  History: see h and p Social History:  Social History   Substance and Sexual Activity  Alcohol Use No   Comment: not since June 2012     Social History   Substance and Sexual Activity  Drug Use No   Comment: patient denies any-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    Social History   Socioeconomic History   Marital  status: Legally Separated    Spouse name: Not on file   Number of children: Not on file   Years of education: Not on file   Highest education level: Not on file  Occupational History   Not on file  Tobacco Use   Smoking status: Every Day    Current packs/day: 0.00    Average packs/day: 0.5 packs/day for 37.3 years (18.7 ttl pk-yrs)    Types: Cigarettes    Start date: 07/07/1981    Last attempt to quit: 11/05/2018    Years since quitting: 5.2   Smokeless tobacco: Never  Vaping Use   Vaping status: Never Used  Substance and Sexual Activity   Alcohol use: No    Comment: not since June 2012   Drug use: No    Comment: patient denies any-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   Sexual activity: Never    Birth control/protection: Surgical  Other Topics Concern   Not on file  Social History Narrative   Not on file   Social Drivers of Health   Financial Resource Strain: Not on file  Food Insecurity: Food Insecurity Present (01/15/2024)   Hunger Vital Sign    Worried About Running Out of Food in the Last Year: Often true    Ran Out of Food in the Last Year: Often true  Transportation Needs: Unmet Transportation Needs (01/15/2024)   PRAPARE - Administrator, Civil Service (Medical): Yes    Lack of Transportation (Non-Medical): Yes  Physical Activity: Not on file  Stress: Not on file  Social Connections: Unknown (10/14/2021)   Received [facility]   Social Connections    Do your friends and family  support you?: Not on file    What agencies support you?: Not on file   Past Medical History:  Past Medical History:  Diagnosis Date   Arthritis    Asthma    Bipolar 1 disorder (HCC)    Diverticulosis    Dysrhythmia    sts I have heart palpitations   Fibromyalgia    H/O hiatal hernia    4   Headache(784.0)    High blood pressure    Meniere's disease    S/P colonoscopy    Dr. Golda 2010: few small diverticula at sigmoid. otherwise normal.     Shortness of breath     Past Surgical History:  Procedure Laterality Date   ABDOMINAL HYSTERECTOMY     APPENDECTOMY     CESAREAN SECTION     CHOLECYSTECTOMY     COLONOSCOPY N/A 12/25/2023   Procedure: COLONOSCOPY;  Surgeon: Eartha Angelia Sieving, MD;  [facility];  Service: Gastroenterology;  Laterality: N/A;  11:30am, asa 1   exploratory laparoscopy     FOOT SURGERY     HEMORRHOID SURGERY     LAPAROSCOPIC APPENDECTOMY  02/19/2011   Procedure: APPENDECTOMY LAPAROSCOPIC;  Surgeon: Oneil DELENA Budge;  Location: [facility];  Service: General;  Laterality: N/A;   LAPAROSCOPY  02/19/2011   Procedure: LAPAROSCOPY DIAGNOSTIC;  Surgeon: Oneil DELENA Budge;  Location: [facility];  Service: General;  Laterality: N/A;   left ovarian removal     multiple hernia repairs     Right ovarian removal     TONSILLECTOMY     TONSILLECTOMY AND ADENOIDECTOMY     tubes in ears     UMBILICAL HERNIA REPAIR  Dec 2011   Dr. Budge   wisdom teeth removal     Family History:  Family History  Problem Relation Age of Onset   Thyroid  disease Mother    Colon cancer Father    Breast cancer Maternal Aunt    Colon cancer Brother    Colon cancer Other        paternal and maternal grandfather   Meniere's disease Other    Anesthesia problems Neg Hx    Hypotension Neg Hx    Malignant hyperthermia Neg Hx    Pseudochol deficiency Neg Hx     Hospital Course:    The patient was admitted for severe psychosis characterized by profound disorganization, affective lability, tangential and nonsensical speech, impaired reality testing, and inability to provide a reliable self-history. She exhibited markedly impaired insight and judgment and was unable to meet basic safety and self-care needs in the community. Urine drug screen was positive for benzodiazepines, amphetamines, and THC, suggesting recent substance use likely exacerbating psychiatric instability.  Initial treatment included olanzapine  5 mg PO BID for  agitation, mood lability, and psychosis. As symptoms persisted, the regimen was transitioned to Prolixin  5 mg PO TID and Depakote  750 mg PO BID, with therapeutic monitoring. Cogentin  was initiated for extrapyramidal symptom prevention and later reduced to 1 mg BID scheduled. Zyprexa  was tapered and discontinued once Prolixin  was fully established. The patient's treatment course required multiple days of close observation, medication adjustments, and behavioral monitoring due to the severity of her symptoms and her inability to participate meaningfully in discharge planning early in admission.  During hospitalization, she was treated for an episode of right jaw swelling and tooth pain with azithromycin  and supportive measures, with improvement noted. No acute medical complications developed during the remainder of admission.  Over time, the patient demonstrated gradual stabilization, with  improved organization of thought, resolution of overt psychotic agitation, and ability to participate in ADLs and unit programming. She remained free from suicidal ideation, homicidal ideation, hallucinations, and aggressive behavior at discharge. She expressed understanding of the need for ongoing psychiatric treatment, medication adherence, and substance abstinence. Medications were titrated to discharge levels as noted below. Valproic  acid level was checked prior to discharge. Patient declined LAI.   At the time of discharge, she was calm, cooperative, alert, and oriented, patient was at psychiatric baseline. Family was contacted and agreeable with PT discharging to their residence, SW engaged them in safe discharge planning. PT no longer required an inpatient level of care or met IVC criteria. They were appropriate for discharge to her family member's home, with outpatient follow-up arranged. A referral was placed for ACT services as well.    Detailed risk assessment is complete based on clinical exam and individual  risk factors and acute suicide risk is low and acute violence risk is low.     Currently, all modifiable risk of harm to self/harm to others have been addressed and patient is no longer appropriate for the acute inpatient setting and is able to continue treatment for mental health needs in the community with the supports as indicated below.  Patient is educated and verbalized understanding of discharge plan of care including medications, follow-up appointments, mental health resources and further crisis services in the community.  He is instructed to call 911 or present to the nearest emergency room should he experience any decompensation in mood, disturbance of bowel or return of suicidal/homicidal ideations.  Patient verbalizes understanding of this education and agrees to this plan of care  Physical Findings: AIMS:  , ,  ,  ,    CIWA:    COWS:        Psychiatric Specialty Exam:  Presentation  General Appearance:  Casual  Eye Contact: Fair  Speech: Clear and Coherent  Speech Volume: Normal    Mood and Affect  Mood: Euthymic; Anxious  Affect: Congruent   Thought Process  Thought Processes: Coherent  Descriptions of Associations:Intact  Orientation:Full (Time, Place and Person)  Thought Content:WDL  Hallucinations:Hallucinations: None  Ideas of Reference:None  Suicidal Thoughts:Suicidal Thoughts: No  Homicidal Thoughts:Homicidal Thoughts: No   Sensorium  Memory: Immediate Fair; Recent Fair  Judgment: Fair  Insight: Fair   Art therapist  Concentration: Fair  Attention Span: Fair  Recall: Good  Fund of Knowledge: Good  Language: Good   Psychomotor Activity  Psychomotor Activity: Psychomotor Activity: Normal  Musculoskeletal: Strength & Muscle Tone: within normal limits Gait & Station: normal Assets  Assets: Manufacturing systems engineer; Desire for Improvement; Social Support; Housing   Sleep  Sleep: Sleep:  Good    Physical Exam: Physical Exam Vitals and nursing note reviewed.  HENT:     Head: Atraumatic.  Eyes:     Extraocular Movements: Extraocular movements intact.  Pulmonary:     Effort: Pulmonary effort is normal.  Neurological:     Mental Status: She is alert and oriented to person, place, and time.    Review of Systems  Psychiatric/Behavioral:  Negative for depression, hallucinations, substance abuse and suicidal ideas. The patient does not have insomnia.    Blood pressure 100/62, pulse 63, temperature (!) 97.4 F (36.3 C), resp. rate 18, height 5' 6 (1.676 m), weight 77.1 kg, SpO2 97%. Body mass index is 27.44 kg/m.   Social History   Tobacco Use  Smoking Status Every Day   Current packs/day: 0.00  Average packs/day: 0.5 packs/day for 37.3 years (18.7 ttl pk-yrs)   Types: Cigarettes   Start date: 07/07/1981   Last attempt to quit: 11/05/2018   Years since quitting: 5.2  Smokeless Tobacco Never   Tobacco Cessation:  A prescription for an FDA-approved tobacco cessation medication provided at discharge   Blood Alcohol level:  Lab Results  Component Value Date   [facility] <15 01/14/2024   ETH <10 05/07/2019    Metabolic Disorder Labs:  Lab Results  Component Value Date   HGBA1C 5.1 01/18/2024   MPG 99.67 01/18/2024   MPG 105.41 05/11/2019   Lab Results  Component Value Date   PROLACTIN 24.1 (H) 05/11/2019   Lab Results  Component Value Date   CHOL 154 01/18/2024   TRIG 180 (H) 01/18/2024   HDL 49 01/18/2024   CHOLHDL 3.1 01/18/2024   VLDL 36 01/18/2024   LDLCALC 69 01/18/2024   LDLCALC 82 05/11/2019    See Psychiatric Specialty Exam and Suicide Risk Assessment completed by Attending Physician prior to discharge.  Discharge destination:  Other:  Family residence  Is patient on multiple antipsychotic therapies at discharge:  No   Has Patient had three or more failed trials of antipsychotic monotherapy by history:  No  Recommended Plan for Multiple  Antipsychotic Therapies: NA   Allergies as of 02/04/2024       Reactions   Amoxicillin Dermatitis, Rash, Other (See Comments)   Patient states she feels sunburnt when taking amoxicillin    Tramadol Rash, Dermatitis   Per pt   Amoxicillin-pot Clavulanate Dermatitis   Sunburn-like painful rash        Medication List     STOP taking these medications    acetaminophen  325 MG tablet Commonly known as: TYLENOL    carvedilol  25 MG tablet Commonly known as: COREG    gabapentin  100 MG capsule Commonly known as: NEURONTIN    linaclotide  72 MCG capsule Commonly known as: Linzess    losartan 25 MG tablet Commonly known as: COZAAR   OLANZapine  10 MG tablet Commonly known as: ZYPREXA    ondansetron  4 MG disintegrating tablet Commonly known as: ZOFRAN -ODT   ondansetron  4 MG tablet Commonly known as: ZOFRAN    QUEtiapine  300 MG tablet Commonly known as: SEROQUEL    tamsulosin 0.4 MG Caps capsule Commonly known as: FLOMAX       TAKE these medications      Indication  albuterol  108 (90 Base) MCG/ACT inhaler Commonly known as: VENTOLIN  HFA Inhale 2 puffs into the lungs every 4 (four) hours as needed for wheezing or shortness of breath.  Indication: Spasm of Lung Air Passages   benztropine  1 MG tablet Commonly known as: COGENTIN  Take 1 tablet (1 mg total) by mouth 2 (two) times daily.  Indication: Extrapyramidal Reaction caused by Medications   divalproex  250 MG DR tablet Commonly known as: DEPAKOTE  Take 3 tablets (750 mg total) by mouth every 12 (twelve) hours. What changed: when to take this  Indication: Mood disorder   fluPHENAZine  5 MG tablet Commonly known as: PROLIXIN  Take 1 tablet (5 mg total) by mouth 3 (three) times daily.  Indication: Psychosis   hydrOXYzine  25 MG tablet Commonly known as: ATARAX  Take 1 tablet (25 mg total) by mouth 3 (three) times daily as needed for anxiety. What changed: when to take this  Indication: Feeling Anxious   nicotine  14  mg/24hr patch Commonly known as: NICODERM CQ  - dosed in mg/24 hours Place 1 patch (14 mg total) onto the skin daily.  Indication: Nicotine  Addiction  traZODone  50 MG tablet Commonly known as: DESYREL  Take 1 tablet (50 mg total) by mouth at bedtime as needed for sleep. What changed:  medication strength how much to take  Indication: Trouble Sleeping        Follow-up Information     Services, [facility] Follow up.   Why: Your appointment is scheduled for Monday, 02/08/24 at 11AM. Bring photo ID, social security card, and insurance information with you to appointment. Contact information: [address] [place] [address] [phone]         Inc, [address] [facility] Follow up.   Why: Referral has been made. Contact information: [address] [place] [address] [phone]                 Follow-up recommendations:    # It is recommended to the patient to continue psychiatric medications as prescribed, after discharge from the hospital.   # It is recommended to the patient to follow up with your outpatient psychiatric provider and PCP. # It was discussed with the patient, the impact of alcohol, drugs, tobacco have been there overall psychiatric and medical wellbeing, and total abstinence from substance use was recommended. # Prescriptions provided or sent directly to preferred pharmacy at discharge. Patient agreeable to plan. Given the opportunity to ask questions. Appears to feel comfortable with discharge.  # In the event of worsening symptoms, the patient is instructed to call the crisis hotline (988), 911 and or go to the nearest ED for appropriate evaluation and treatment of symptoms. To follow-up with primary care provider for other medical issues, concerns and or health care needs # Patient was discharged  as requested with a plan to follow up as noted above.      Signed: Donnice FORBES Right, PA-C 02/04/2024, 12:46 PM

## 2024-02-04 NOTE — BHH Suicide Risk Assessment (Signed)
 [facility] Discharge Suicide Risk Assessment   Principal Problem: Psychoactive substance-induced psychosis (HCC) Discharge Diagnoses: Principal Problem:   Psychoactive substance-induced psychosis (HCC) Active Problems:   Bipolar disorder (HCC)   Substance induced mood disorder (HCC)   Total Time spent with patient: 1 hour  Musculoskeletal: Strength & Muscle Tone: within normal limits Gait & Station: normal Patient leans: N/A  Psychiatric Specialty Exam  Presentation  General Appearance:  Casual  Eye Contact: Fair  Speech: Clear and Coherent  Speech Volume: Normal  Handedness:No data recorded  Mood and Affect  Mood: Euthymic; Anxious  Duration of Depression Symptoms: -- ginette)  Affect: Congruent   Thought Process  Thought Processes: Coherent  Descriptions of Associations:Intact  Orientation:Full (Time, Place and Person)  Thought Content:WDL  History of Schizophrenia/Schizoaffective disorder:No  Duration of Psychotic Symptoms:Greater than six months  Hallucinations:Hallucinations: None  Ideas of Reference:None  Suicidal Thoughts:Suicidal Thoughts: No  Homicidal Thoughts:Homicidal Thoughts: No   Sensorium  Memory: Immediate Fair; Recent Fair  Judgment: Fair  Insight: Fair   Art therapist  Concentration: Fair  Attention Span: Fair  Recall: Good  Fund of Knowledge: Good  Language: Good   Psychomotor Activity  Psychomotor Activity: Psychomotor Activity: Normal   Assets  Assets: Communication Skills; Desire for Improvement; Social Support; Housing   Sleep  Sleep: Sleep: Good  Estimated Sleeping Duration (Last 24 Hours): 7.50-8.75 hours  Physical Exam: Physical Exam Vitals and nursing note reviewed.  HENT:     Head: Atraumatic.  Eyes:     Extraocular Movements: Extraocular movements intact.  Pulmonary:     Effort: Pulmonary effort is normal.  Neurological:     Mental Status: She is alert and oriented to  person, place, and time.    Review of Systems  Psychiatric/Behavioral:  Negative for depression, hallucinations, substance abuse and suicidal ideas. The patient is not nervous/anxious and does not have insomnia.    Blood pressure 100/62, pulse 63, temperature (!) 97.4 F (36.3 C), resp. rate 18, height 5' 6 (1.676 m), weight 77.1 kg, SpO2 97%. Body mass index is 27.44 kg/m.  Mental Status Per Nursing Assessment::   On Admission:  NA  Demographic Factors:  Caucasian, Low socioeconomic status, and Unemployed  Loss Factors: NA  Historical Factors: Impulsivity  Risk Reduction Factors:   Living with another person, especially a relative and Positive social support  Continued Clinical Symptoms:  Previous Psychiatric Diagnoses and Treatments  Cognitive Features That Contribute To Risk:  None    Suicide Risk:  Minimal: No identifiable suicidal ideation.  Patients presenting with no risk factors but with morbid ruminations; may be classified as minimal risk based on the severity of the depressive symptoms   Follow-up Information     [facility] Follow up.   Why: Your appointment is scheduled for Monday, 02/08/24 at 11AM. Bring photo ID, social security card, and insurance information with you to appointment. Contact information: [address] [place] [address] [phone]         Inc, [address] [facility] Follow up.   Why: Referral has been made. Contact information: [address] [place] [address] [phone]                 Plan Of Care/Follow-up recommendations:  # It is recommended to the patient to continue psychiatric medications as prescribed, after discharge from the hospital.   # It is recommended to the patient to follow up with your outpatient psychiatric provider and PCP. # It was discussed with the  patient, the impact of alcohol, drugs, tobacco have been there overall psychiatric and medical wellbeing, and total  abstinence from substance use was recommended. # Prescriptions provided or sent directly to preferred pharmacy at discharge. Patient agreeable to plan. Given the opportunity to ask questions. Appears to feel comfortable with discharge.  # In the event of worsening symptoms, the patient is instructed to call the crisis hotline (988), 911 and or go to the nearest ED for appropriate evaluation and treatment of symptoms. To follow-up with primary care provider for other medical issues, concerns and or health care needs # Patient was discharged home as requested with a plan to follow up as noted above.    Donnice FORBES Right, PA-C 02/04/2024, 12:44 PM

## 2024-02-04 NOTE — Group Note (Signed)
 Recreation Therapy Group Note   Group Topic:General Recreation  Group Date: 02/04/2024 Start Time: 9049 End Time: 1040 Facilitators: Celestia Jeoffrey BRAVO, LRT, CTRS Location: Courtyard  Group Description: [employer]. LRT and patients played games of basketball, drew with chalk, and played corn hole while outside in the courtyard while getting fresh air and sunlight. Music was being played in the background. LRT and peers conversed about different games they have played before, what they do in their free time and anything else that is on their minds. LRT encouraged pts to drink water  after being outside, sweating and getting their heart rate up.  Goal Area(s) Addressed: Patient will build on frustration tolerance skills. Patients will partake in a competitive play game with peers. Patients will gain knowledge of new leisure interest/hobby.    Affect/Mood: N/A   Participation Level: Did not attend    Clinical Observations/Individualized Feedback: Patient did not attend group.   Plan: Continue to engage patient in RT group sessions 2-3x/week.   Jeoffrey BRAVO Celestia, LRT, CTRS 02/04/2024 11:08 AM

## 2024-02-04 NOTE — Plan of Care (Signed)
  Problem: Education: Goal: Emotional status will improve 02/04/2024 1355 by Sandralee Cadet, RN Outcome: Adequate for Discharge 02/04/2024 1354 by Sandralee Cadet, RN Outcome: Progressing   Problem: Education: Goal: Mental status will improve 02/04/2024 1355 by Sandralee Cadet, RN Outcome: Adequate for Discharge 02/04/2024 1354 by Sandralee Cadet, RN Outcome: Progressing   Problem: Coping: Goal: Ability to verbalize frustrations and anger appropriately will improve 02/04/2024 1355 by Sandralee Cadet, RN Outcome: Adequate for Discharge 02/04/2024 1354 by Sandralee Cadet, RN Outcome: Progressing   Problem: Coping: Goal: Ability to demonstrate self-control will improve 02/04/2024 1355 by Sandralee Cadet, RN Outcome: Adequate for Discharge 02/04/2024 1354 by Sandralee Cadet, RN Outcome: Progressing   Problem: Health Behavior/Discharge Planning: Goal: Compliance with treatment plan for underlying cause of condition will improve 02/04/2024 1355 by Sandralee Cadet, RN Outcome: Adequate for Discharge 02/04/2024 1354 by Sandralee Cadet, RN Outcome: Progressing

## 2024-02-04 NOTE — Progress Notes (Signed)
  [facility] Adult Case Management Discharge Plan :  Will you be returning to the same living situation after discharge:  No. At discharge, do you have transportation home?: Yes,  pt received taxi voucher to cousin's home.  Do you have the ability to pay for your medications: Yes,  [facility] [place]  Release of information consent forms completed and in the chart;  Patient's signature needed at discharge.  Patient to Follow up at:  Follow-up Information     [facility] Follow up.   Why: Your appointment is scheduled for Monday, 02/08/24 at 11AM. Bring photo ID, social security card, and insurance information with you to appointment. Contact information: [address] [place] [address] [phone]         Inc, [address] [facility] Follow up.   Why: Referral has been made. Contact information: [address] [place] [address] [phone]                 Next level of care provider has access to [facility] Link:no  Safety Planning and Suicide Prevention discussed: Yes,  SPE completed with pt.      Has patient been referred to the Quitline?: Patient refused referral for treatment  Patient has been referred for addiction treatment: Patient refused referral for treatment.  [name], LCSW 02/04/2024, 10:21 AM

## 2024-02-04 NOTE — Plan of Care (Signed)
   Problem: Education: Goal: Emotional status will improve Outcome: Progressing Goal: Mental status will improve Outcome: Progressing

## 2024-02-05 ENCOUNTER — Telehealth: Payer: Self-pay

## 2024-02-05 NOTE — Transitions of Care (Post Inpatient/ED Visit) (Signed)
   02/05/2024  Name: YARISSA REINING MRN: [id number] DOB: 1971-01-27  Today's TOC FU Call Status: Today's TOC FU Call Status:: Unsuccessful Call (1st Attempt) Unsuccessful Call (1st Attempt) Date: 02/05/24  Attempted to reach the patient regarding the most recent Inpatient/ED visit.  Follow Up Plan: Additional outreach attempts will be made to reach the patient to complete the Transitions of Care (Post Inpatient/ED visit) call.   Shona Prow RN, CCM [place]  VBCI-Population Health RN Care Manager [phone]

## 2024-02-08 ENCOUNTER — Telehealth: Payer: Self-pay

## 2024-02-08 NOTE — Transitions of Care (Post Inpatient/ED Visit) (Signed)
   02/08/2024  Name: KY MOSKOWITZ MRN: [id number] DOB: 11-11-1970  Today's TOC FU Call Status: Today's TOC FU Call Status:: Successful TOC FU Call Completed TOC FU Call Complete Date: 02/08/24 (Spoke with patient who states her daughter had a baby who is still in hospital so she is going to [place]  - has too much going on and declined TOC program/calls) Patient's Name and Date of Birth confirmed.  Shona Prow RN, CCM [place]  VBCI-Population Health RN Care Manager [phone]

## 2024-02-09 ENCOUNTER — Encounter (HOSPITAL_COMMUNITY)

## 2024-02-11 ENCOUNTER — Encounter (HOSPITAL_COMMUNITY)

## 2024-02-15 DIAGNOSIS — F419 Anxiety disorder, unspecified: Secondary | ICD-10-CM | POA: Diagnosis not present

## 2024-02-15 DIAGNOSIS — F29 Unspecified psychosis not due to a substance or known physiological condition: Secondary | ICD-10-CM | POA: Diagnosis not present

## 2024-02-15 DIAGNOSIS — R457 State of emotional shock and stress, unspecified: Secondary | ICD-10-CM | POA: Diagnosis not present
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
## 2024-02-16 DIAGNOSIS — I10 Essential (primary) hypertension: Secondary | ICD-10-CM | POA: Diagnosis not present

## 2024-02-16 DIAGNOSIS — M6740 Ganglion, unspecified site: Secondary | ICD-10-CM | POA: Diagnosis not present

## 2024-02-16 DIAGNOSIS — R079 Chest pain, unspecified: Secondary | ICD-10-CM | POA: Diagnosis not present

## 2024-02-16 DIAGNOSIS — F419 Anxiety disorder, unspecified: Secondary | ICD-10-CM | POA: Diagnosis not present

## 2024-02-16 DIAGNOSIS — E1169 Type 2 diabetes mellitus with other specified complication: Secondary | ICD-10-CM | POA: Diagnosis not present

## 2024-02-16 DIAGNOSIS — F29 Unspecified psychosis not due to a substance or known physiological condition: Secondary | ICD-10-CM | POA: Diagnosis not present

## 2024-02-16 DIAGNOSIS — J309 Allergic rhinitis, unspecified: Secondary | ICD-10-CM | POA: Diagnosis not present

## 2024-02-16 DIAGNOSIS — G603 Idiopathic progressive neuropathy: Secondary | ICD-10-CM | POA: Diagnosis not present

## 2024-02-16 DIAGNOSIS — Z5941 Food insecurity: Secondary | ICD-10-CM | POA: Diagnosis not present

## 2024-02-16 DIAGNOSIS — Z880 Allergy status to penicillin: Secondary | ICD-10-CM | POA: Diagnosis not present

## 2024-02-16 DIAGNOSIS — R457 State of emotional shock and stress, unspecified: Secondary | ICD-10-CM | POA: Diagnosis not present

## 2024-02-16 DIAGNOSIS — M542 Cervicalgia: Secondary | ICD-10-CM | POA: Diagnosis not present

## 2024-02-16 DIAGNOSIS — T730XXA Starvation, initial encounter: Secondary | ICD-10-CM | POA: Diagnosis not present

## 2024-02-16 DIAGNOSIS — M5459 Other low back pain: Secondary | ICD-10-CM | POA: Diagnosis not present

## 2024-02-16 DIAGNOSIS — I872 Venous insufficiency (chronic) (peripheral): Secondary | ICD-10-CM | POA: Diagnosis not present
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
## 2024-02-17 ENCOUNTER — Emergency Department (HOSPITAL_COMMUNITY)
Admission: EM | Admit: 2024-02-17 | Discharge: 2024-02-18 | Disposition: A | Discharge status: Home / Self Care | Attending: Emergency Medicine | Admitting: Emergency Medicine

## 2024-02-17 ENCOUNTER — Encounter (HOSPITAL_COMMUNITY): Payer: Self-pay

## 2024-02-17 ENCOUNTER — Other Ambulatory Visit: Payer: Self-pay

## 2024-02-17 DIAGNOSIS — F29 Unspecified psychosis not due to a substance or known physiological condition: Secondary | ICD-10-CM | POA: Diagnosis not present

## 2024-02-17 DIAGNOSIS — J45909 Unspecified asthma, uncomplicated: Secondary | ICD-10-CM | POA: Insufficient documentation

## 2024-02-17 DIAGNOSIS — Z79899 Other long term (current) drug therapy: Secondary | ICD-10-CM | POA: Diagnosis not present

## 2024-02-17 DIAGNOSIS — F312 Bipolar disorder, current episode manic severe with psychotic features: Secondary | ICD-10-CM | POA: Diagnosis present

## 2024-02-17 DIAGNOSIS — F17210 Nicotine dependence, cigarettes, uncomplicated: Secondary | ICD-10-CM | POA: Insufficient documentation

## 2024-02-17 DIAGNOSIS — F419 Anxiety disorder, unspecified: Secondary | ICD-10-CM | POA: Diagnosis not present

## 2024-02-17 HISTORY — DX: Anxiety disorder, unspecified: F41.9

## 2024-02-17 LAB — ETHANOL: Alcohol, Ethyl (B): <15 mg/dL (ref <15)

## 2024-02-17 LAB — COMPREHENSIVE METABOLIC PANEL WITH GFR
ALT: 13 U/L (ref 0–44)
AST: 16 U/L (ref 15–41)
Albumin: 3.7 g/dL (ref 3.5–5.0)
Alkaline Phosphatase: 52 U/L (ref 38–126)
Anion gap: 9 (ref 5–15)
BUN: 17 mg/dL (ref 6–20)
CO2: 23 mmol/L (ref 22–32)
Calcium: 8.8 mg/dL — ABNORMAL LOW (ref 8.9–10.3)
Chloride: 106 mmol/L (ref 98–111)
Creatinine, Ser: 0.56 mg/dL (ref 0.44–1.00)
GFR, Estimated: >60 mL/min (ref >60)
Glucose, Bld: 91 mg/dL (ref 70–99)
Potassium: 4 mmol/L (ref 3.5–5.1)
Sodium: 138 mmol/L (ref 135–145)
Total Bilirubin: 0.5 mg/dL (ref 0.0–1.2)
Total Protein: 6.5 g/dL (ref 6.5–8.1)

## 2024-02-17 LAB — CBC WITH DIFFERENTIAL/PLATELET
Abs Immature Granulocytes: 0.03 K/uL (ref 0.00–0.07)
Basophils Absolute: 0.1 K/uL (ref 0.0–0.1)
Basophils Relative: 1 %
Eosinophils Absolute: 0.4 K/uL (ref 0.0–0.5)
Eosinophils Relative: 4 %
HCT: 40 % (ref 36.0–46.0)
Hemoglobin: 13.7 g/dL (ref 12.0–15.0)
Immature Granulocytes: 0 %
Lymphocytes Relative: 37 %
Lymphs Abs: 3.9 K/uL (ref 0.7–4.0)
MCH: 30 pg (ref 26.0–34.0)
MCHC: 34.3 g/dL (ref 30.0–36.0)
MCV: 87.7 fL (ref 80.0–100.0)
Monocytes Absolute: 1 K/uL (ref 0.1–1.0)
Monocytes Relative: 9 %
Neutro Abs: 5.2 K/uL (ref 1.7–7.7)
Neutrophils Relative %: 49 %
Platelets: 268 K/uL (ref 150–400)
RBC: 4.56 MIL/uL (ref 3.87–5.11)
RDW: 13.3 % (ref 11.5–15.5)
WBC: 10.7 K/uL — ABNORMAL HIGH (ref 4.0–10.5)
nRBC: 0 % (ref 0.0–0.2)

## 2024-02-17 LAB — RAPID URINE DRUG SCREEN, HOSP PERFORMED
Amphetamines: POSITIVE — AB
Barbiturates: NOT DETECTED
Benzodiazepines: NOT DETECTED
Cocaine: NOT DETECTED
Opiates: NOT DETECTED
Tetrahydrocannabinol: NOT DETECTED

## 2024-02-17 MED ORDER — NICOTINE 21 MG/24HR TD PT24
21.0000 mg | MEDICATED_PATCH | Freq: Every day | TRANSDERMAL | Status: DC
  Filled 2024-02-17: qty 1

## 2024-02-17 MED ORDER — DIAZEPAM 5 MG/ML IJ SOLN
5.0000 mg | Freq: Once | INTRAMUSCULAR | Status: AC
  Administered 2024-02-17 (×2): 5 mg via INTRAMUSCULAR
  Filled 2024-02-17: qty 2

## 2024-02-17 MED ORDER — ZIPRASIDONE MESYLATE 20 MG IM SOLR
20.0000 mg | Freq: Once | INTRAMUSCULAR | Status: AC
Start: 2024-02-17 — End: 2024-02-17
  Administered 2024-02-17 (×2): 20 mg via INTRAMUSCULAR
  Filled 2024-02-17: qty 20

## 2024-02-17 MED ORDER — ACETAMINOPHEN 325 MG PO TABS: 650.0000 mg | ORAL_TABLET | ORAL | Status: DC | PRN

## 2024-02-17 NOTE — ED Notes (Signed)
 Pt escorted back to ED room 15 by security.

## 2024-02-17 NOTE — ED Notes (Signed)
 Pt ambulated to restroom. Talking to self.

## 2024-02-17 NOTE — ED Triage Notes (Signed)
 Pt reports she was just released yesterday from [facility] after a 2 month admission for anxiety and depression. Pt reports she just wants to speak with a psychiatrist because her doctor can't take her anymore.  Pt denies suicidal or homicidal ideation.
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
## 2024-02-17 NOTE — ED Notes (Signed)
 Pt walked out against staff's persuasion to stay. Consulting civil engineer, Office manager, and Psychologist, forensic following pt.
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
## 2024-02-17 NOTE — ED Notes (Signed)
Pt dressed out into purple scrubs.  ?
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
## 2024-02-17 NOTE — BH Assessment (Incomplete)
 IRIS attempted to see patient. Per Nat, RN, she has been given valium  and Geodon . pt is sleeping at this time. IRIS will attempt at later time.

## 2024-02-17 NOTE — ED Notes (Signed)
 Pt asking for food at this time. Staff informed pt that she would need to be evaluated first.

## 2024-02-17 NOTE — BH Assessment (Signed)
 TTS Consult will be completed by IRIS. IRIS Coordinator will communicate in established chat with assessment time and provider name. Thanks

## 2024-02-18 ENCOUNTER — Encounter (HOSPITAL_COMMUNITY): Payer: Self-pay | Admitting: Behavioral Health

## 2024-02-18 ENCOUNTER — Inpatient Hospital Stay (HOSPITAL_COMMUNITY)
Admission: AD | Admit: 2024-02-18 | Discharge: 2024-05-08 | DRG: 885 | Disposition: A | Discharge status: Home / Self Care | Source: Intra-hospital

## 2024-02-18 ENCOUNTER — Encounter (HOSPITAL_COMMUNITY): Payer: Self-pay

## 2024-02-18 DIAGNOSIS — F0280 Dementia in other diseases classified elsewhere without behavioral disturbance: Secondary | ICD-10-CM | POA: Diagnosis not present

## 2024-02-18 DIAGNOSIS — Y9289 Other specified places as the place of occurrence of the external cause: Secondary | ICD-10-CM

## 2024-02-18 DIAGNOSIS — F19959 Other psychoactive substance use, unspecified with psychoactive substance-induced psychotic disorder, unspecified: Secondary | ICD-10-CM | POA: Diagnosis not present

## 2024-02-18 DIAGNOSIS — F17210 Nicotine dependence, cigarettes, uncomplicated: Secondary | ICD-10-CM | POA: Diagnosis present

## 2024-02-18 DIAGNOSIS — Z635 Disruption of family by separation and divorce: Secondary | ICD-10-CM | POA: Diagnosis not present

## 2024-02-18 DIAGNOSIS — Z79899 Other long term (current) drug therapy: Secondary | ICD-10-CM

## 2024-02-18 DIAGNOSIS — F03918 Unspecified dementia, unspecified severity, with other behavioral disturbance: Secondary | ICD-10-CM | POA: Diagnosis not present

## 2024-02-18 DIAGNOSIS — F0282 Dementia in other diseases classified elsewhere, unspecified severity, with psychotic disturbance: Secondary | ICD-10-CM | POA: Diagnosis present

## 2024-02-18 DIAGNOSIS — W1830XA Fall on same level, unspecified, initial encounter: Secondary | ICD-10-CM | POA: Diagnosis not present

## 2024-02-18 DIAGNOSIS — Z803 Family history of malignant neoplasm of breast: Secondary | ICD-10-CM

## 2024-02-18 DIAGNOSIS — G4700 Insomnia, unspecified: Secondary | ICD-10-CM | POA: Diagnosis present

## 2024-02-18 DIAGNOSIS — Z5982 Transportation insecurity: Secondary | ICD-10-CM

## 2024-02-18 DIAGNOSIS — F1520 Other stimulant dependence, uncomplicated: Secondary | ICD-10-CM | POA: Diagnosis not present

## 2024-02-18 DIAGNOSIS — B9620 Unspecified Escherichia coli [E. coli] as the cause of diseases classified elsewhere: Secondary | ICD-10-CM | POA: Diagnosis present

## 2024-02-18 DIAGNOSIS — Z91148 Patient's other noncompliance with medication regimen for other reason: Secondary | ICD-10-CM

## 2024-02-18 DIAGNOSIS — F319 Bipolar disorder, unspecified: Secondary | ICD-10-CM | POA: Diagnosis not present

## 2024-02-18 DIAGNOSIS — Z1629 Resistance to other single specified antibiotic: Secondary | ICD-10-CM | POA: Diagnosis present

## 2024-02-18 DIAGNOSIS — F1990 Other psychoactive substance use, unspecified, uncomplicated: Secondary | ICD-10-CM | POA: Diagnosis not present

## 2024-02-18 DIAGNOSIS — Z9151 Personal history of suicidal behavior: Secondary | ICD-10-CM | POA: Diagnosis not present

## 2024-02-18 DIAGNOSIS — M797 Fibromyalgia: Secondary | ICD-10-CM | POA: Diagnosis present

## 2024-02-18 DIAGNOSIS — M25561 Pain in right knee: Principal | ICD-10-CM

## 2024-02-18 DIAGNOSIS — Z8659 Personal history of other mental and behavioral disorders: Secondary | ICD-10-CM | POA: Diagnosis not present

## 2024-02-18 DIAGNOSIS — Z9049 Acquired absence of other specified parts of digestive tract: Secondary | ICD-10-CM | POA: Diagnosis not present

## 2024-02-18 DIAGNOSIS — Z8349 Family history of other endocrine, nutritional and metabolic diseases: Secondary | ICD-10-CM

## 2024-02-18 DIAGNOSIS — J45909 Unspecified asthma, uncomplicated: Secondary | ICD-10-CM | POA: Diagnosis present

## 2024-02-18 DIAGNOSIS — N390 Urinary tract infection, site not specified: Secondary | ICD-10-CM | POA: Diagnosis not present

## 2024-02-18 DIAGNOSIS — Z800 Family history of malignant neoplasm of digestive organs: Secondary | ICD-10-CM | POA: Diagnosis not present

## 2024-02-18 DIAGNOSIS — F0284 Dementia in other diseases classified elsewhere, unspecified severity, with anxiety: Secondary | ICD-10-CM | POA: Diagnosis present

## 2024-02-18 DIAGNOSIS — F1994 Other psychoactive substance use, unspecified with psychoactive substance-induced mood disorder: Secondary | ICD-10-CM | POA: Diagnosis not present

## 2024-02-18 DIAGNOSIS — F02818 Dementia in other diseases classified elsewhere, unspecified severity, with other behavioral disturbance: Secondary | ICD-10-CM | POA: Diagnosis present

## 2024-02-18 DIAGNOSIS — F411 Generalized anxiety disorder: Secondary | ICD-10-CM | POA: Diagnosis present

## 2024-02-18 DIAGNOSIS — F0283 Dementia in other diseases classified elsewhere, unspecified severity, with mood disturbance: Secondary | ICD-10-CM | POA: Diagnosis present

## 2024-02-18 DIAGNOSIS — R112 Nausea with vomiting, unspecified: Secondary | ICD-10-CM | POA: Diagnosis present

## 2024-02-18 DIAGNOSIS — F17200 Nicotine dependence, unspecified, uncomplicated: Secondary | ICD-10-CM | POA: Diagnosis not present

## 2024-02-18 DIAGNOSIS — F0390 Unspecified dementia without behavioral disturbance: Secondary | ICD-10-CM | POA: Diagnosis not present

## 2024-02-18 DIAGNOSIS — F312 Bipolar disorder, current episode manic severe with psychotic features: Secondary | ICD-10-CM | POA: Diagnosis not present

## 2024-02-18 DIAGNOSIS — F15259 Other stimulant dependence with stimulant-induced psychotic disorder, unspecified: Secondary | ICD-10-CM | POA: Diagnosis present

## 2024-02-18 DIAGNOSIS — M549 Dorsalgia, unspecified: Secondary | ICD-10-CM | POA: Diagnosis not present

## 2024-02-18 DIAGNOSIS — Z90710 Acquired absence of both cervix and uterus: Secondary | ICD-10-CM

## 2024-02-18 DIAGNOSIS — W19XXXA Unspecified fall, initial encounter: Secondary | ICD-10-CM

## 2024-02-18 LAB — VALPROIC ACID LEVEL: Valproic Acid Lvl: <10 ug/mL — ABNORMAL LOW (ref 50–100)

## 2024-02-18 MED ORDER — OLANZAPINE 5 MG PO TBDP
5.0000 mg | ORAL_TABLET | Freq: Three times a day (TID) | ORAL | Status: DC | PRN
  Administered 2024-02-18 – 2024-04-18 (×9): 5 mg via ORAL
  Filled 2024-02-18 (×9): qty 1

## 2024-02-18 MED ORDER — NICOTINE 14 MG/24HR TD PT24
14.0000 mg | MEDICATED_PATCH | Freq: Every day | TRANSDERMAL | Status: DC
  Filled 2024-02-18 (×2): qty 1

## 2024-02-18 MED ORDER — TRAZODONE HCL 50 MG PO TABS: 50.0000 mg | ORAL_TABLET | Freq: Every evening | ORAL | Status: DC | PRN

## 2024-02-18 MED ORDER — BENZTROPINE MESYLATE 1 MG PO TABS
1.0000 mg | ORAL_TABLET | Freq: Two times a day (BID) | ORAL | Status: DC
  Administered 2024-02-18 – 2024-05-08 (×160): 1 mg via ORAL
  Filled 2024-02-18 (×160): qty 1

## 2024-02-18 MED ORDER — MAGNESIUM HYDROXIDE 400 MG/5ML PO SUSP
30.0000 mL | Freq: Every day | ORAL | Status: DC | PRN
  Filled 2024-02-18: qty 30

## 2024-02-18 MED ORDER — ACETAMINOPHEN 325 MG PO TABS
650.0000 mg | ORAL_TABLET | Freq: Four times a day (QID) | ORAL | Status: DC | PRN
  Administered 2024-02-20 – 2024-05-04 (×29): 650 mg via ORAL
  Filled 2024-02-18 (×30): qty 2

## 2024-02-18 MED ORDER — FLUPHENAZINE HCL 5 MG PO TABS
5.0000 mg | ORAL_TABLET | Freq: Two times a day (BID) | ORAL | Status: DC
  Administered 2024-02-18: 5 mg via ORAL
  Filled 2024-02-18 (×3): qty 1

## 2024-02-18 MED ORDER — FLUPHENAZINE HCL 5 MG PO TABS
5.0000 mg | ORAL_TABLET | Freq: Two times a day (BID) | ORAL | Status: DC
  Administered 2024-02-18 – 2024-02-19 (×2): 5 mg via ORAL
  Filled 2024-02-18 (×2): qty 1

## 2024-02-18 MED ORDER — DIVALPROEX SODIUM 250 MG PO DR TAB
500.0000 mg | DELAYED_RELEASE_TABLET | Freq: Two times a day (BID) | ORAL | Status: DC
  Administered 2024-02-18: 500 mg via ORAL
  Filled 2024-02-18: qty 2

## 2024-02-18 MED ORDER — OLANZAPINE 10 MG IM SOLR: 5.0000 mg | Freq: Three times a day (TID) | INTRAMUSCULAR | Status: DC | PRN

## 2024-02-18 MED ORDER — HYDROXYZINE HCL 25 MG PO TABS
25.0000 mg | ORAL_TABLET | Freq: Three times a day (TID) | ORAL | Status: DC | PRN
  Administered 2024-02-19 – 2024-05-07 (×41): 25 mg via ORAL
  Filled 2024-02-18 (×42): qty 1

## 2024-02-18 MED ORDER — DIVALPROEX SODIUM 500 MG PO DR TAB
500.0000 mg | DELAYED_RELEASE_TABLET | Freq: Two times a day (BID) | ORAL | Status: DC
  Administered 2024-02-18 – 2024-02-20 (×4): 500 mg via ORAL
  Filled 2024-02-18 (×4): qty 1

## 2024-02-18 MED ORDER — ALUM & MAG HYDROXIDE-SIMETH 200-200-20 MG/5ML PO SUSP
30.0000 mL | ORAL | Status: DC | PRN
  Administered 2024-02-20 – 2024-04-25 (×16): 30 mL via ORAL
  Filled 2024-02-18 (×16): qty 30

## 2024-02-18 MED ORDER — TRAZODONE HCL 50 MG PO TABS
50.0000 mg | ORAL_TABLET | Freq: Every day | ORAL | Status: DC
  Administered 2024-02-18 – 2024-05-06 (×79): 50 mg via ORAL
  Filled 2024-02-18 (×79): qty 1

## 2024-02-18 MED ORDER — NICOTINE POLACRILEX 2 MG MT GUM
2.0000 mg | CHEWING_GUM | OROMUCOSAL | Status: DC | PRN
  Administered 2024-02-18 – 2024-04-23 (×12): 2 mg via ORAL
  Filled 2024-02-18 (×6): qty 1

## 2024-02-18 MED ORDER — STERILE WATER FOR INJECTION IJ SOLN
INTRAMUSCULAR | Status: AC
  Administered 2024-02-18: 1.2 mL
  Filled 2024-02-18: qty 10

## 2024-02-18 MED ORDER — BENZTROPINE MESYLATE 1 MG PO TABS
1.0000 mg | ORAL_TABLET | Freq: Two times a day (BID) | ORAL | Status: DC
  Administered 2024-02-18: 1 mg via ORAL
  Filled 2024-02-18: qty 1

## 2024-02-18 MED ORDER — OLANZAPINE 10 MG IM SOLR: 10.0000 mg | Freq: Three times a day (TID) | INTRAMUSCULAR | Status: DC | PRN

## 2024-02-18 MED ORDER — ZIPRASIDONE MESYLATE 20 MG IM SOLR
20.0000 mg | Freq: Once | INTRAMUSCULAR | Status: AC
  Administered 2024-02-18: 20 mg via INTRAMUSCULAR
  Filled 2024-02-18: qty 20

## 2024-02-18 NOTE — Progress Notes (Signed)
 This provider connected with the patient remotely to verify home psychotropic medications. Patient was identified using name and date of birth. Patient is alert and oriented x 3. However, her thought process is disorganized with delusional thought content.  Patient is a poor historian. Patient reports currently taking olanzapine , benztropine , hydroxyzine , Lamictal , Depakote  and trazodone . She is unable to recall the dosage for each medication. Per chart review, patient was hospitalized at [facility] from January 15, 2024 to February 05, 2024 and was prescribed benztropine  1 mg BID, Depakote  DR 750 mg every 12 hours, Prolixin  5 mg TID, hydroxyzine  25 mg TID PRN for anxiety, and trazodone  50 mg nightly as needed for sleep. Patient stabilized and had a good response to the stated medications. Will obtain valproic  acid level and EKG. Valproic  acid level < 10. Liver function is WNL. Will start Depakote  DR 500 mg BID, cogentin  1 mg BID,  trazodone  50 mg at bedtime prn and Prolixin  5 mg BID. Case discussed with Dr. Larina.

## 2024-02-18 NOTE — ED Notes (Signed)
 we [facility] pt [facility]. bed assignment is 500-1 and it is a pending discharge. RN TO Rn report to [phone]. we still need FINDINGS and Custody please. this needs to be completed as pt is not considered SERVED and will not be able to transport until this is done with part a and part b signed. please fax form to [phone] or upload into media tab. DX Bipolar .DO. MRE Manic with psychotic feat. Attending MD Pashayan.

## 2024-02-18 NOTE — ED Notes (Signed)
 Patient was given her breakfast, and is eating.

## 2024-02-18 NOTE — ED Notes (Signed)
 Pt ambulated to restroom.
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
## 2024-02-18 NOTE — ED Provider Notes (Signed)
 Emergency Medicine Observation Re-evaluation Note  Allison Bolton is a 53 y.o. female, seen on rounds today.  Pt initially presented to the ED for complaints of Psychiatric Evaluation Currently, the patient is awaiting psych placement.  Physical Exam  BP 101/76   Pulse 66   Temp 98.4 F (36.9 C) (Oral)   Resp 16   Ht 5' 6 (1.676 m)   Wt 77.1 kg   SpO2 96%   BMI 27.43 kg/m  Physical Exam Resting and in no acute distress  ED Course / MDM  EKG:   I have reviewed the labs performed to date as well as medications administered while in observation.  Recent changes in the last 24 hours include none.  Plan  Current plan is for psych placement.    Suzette Pac, MD 02/18/24 [phone]

## 2024-02-18 NOTE — ED Notes (Signed)
 Patient has gotten her lunch tray.
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
## 2024-02-18 NOTE — ED Notes (Signed)
 Pt was allowed to take a shower and she was given a cup of coffee.
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
## 2024-02-18 NOTE — BH Assessment (Signed)
 Clinician checked with RN Nat and found out patient was back to sleep.  Pt had been given 20mg  Geodon  IM at 2015 and 5mg  Valium  IM at 20:54.

## 2024-02-18 NOTE — ED Notes (Signed)
 Patient asked for the phone to talk to her daughter, this sitter told her, that the nurse is aware and they will come by in a little bit to discuss that with her. Patient said okay, requested for cartoons on the tv and laid down. Sitter put on cartoons.

## 2024-02-18 NOTE — ED Notes (Signed)
 Patient just requested the phone to call her family from [place] to come pick her up from the hospital. I told her the nurse would be around in a bit and she can ask her then. 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 This sitter nodded and the patient went to go lay down in her bed and watch cartoons.
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
## 2024-02-18 NOTE — ED Notes (Signed)
 Pt care taken, sitter at bedside, appears to be sleeping, audible respirations, even chest rise and fall.

## 2024-02-18 NOTE — Plan of Care (Signed)
  Problem: Safety: Goal: Periods of time without injury will increase Outcome: Progressing   Problem: Nutritional: Goal: Ability to achieve adequate nutritional intake will improve Outcome: Progressing

## 2024-02-18 NOTE — Progress Notes (Signed)
 Pt has been accepted to [facility] on 02/18/2024 . Bed assignment:500-1  Pt meets inpatient criteria per Richerd Ivans, NP.   Attending Physician will be Dr. Raliegh   Report can be called to: Adult unit: [phone]  Pt can arrive pending discharges and completion of IVC   Care Team Notified: [facility] Bretta Qua, RN, Wyline Pizza, NP, Juliane Maxim, RN
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
## 2024-02-18 NOTE — ED Notes (Signed)
 Pt is having TTS consult

## 2024-02-18 NOTE — ED Notes (Signed)
 Pt appears sad at this time but cooperative. Denies SI/HI.

## 2024-02-19 ENCOUNTER — Encounter (HOSPITAL_COMMUNITY): Payer: Self-pay

## 2024-02-19 DIAGNOSIS — F312 Bipolar disorder, current episode manic severe with psychotic features: Secondary | ICD-10-CM | POA: Diagnosis not present

## 2024-02-19 MED ORDER — FLUPHENAZINE HCL 5 MG PO TABS
5.0000 mg | ORAL_TABLET | Freq: Three times a day (TID) | ORAL | Status: DC
  Administered 2024-02-19 – 2024-02-20 (×3): 5 mg via ORAL
  Filled 2024-02-19 (×3): qty 1
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
## 2024-02-19 NOTE — BHH Group Notes (Signed)
 Adult Psychoeducational Group Note  Date:  02/19/2024 Time:  5:37 PM  Group Topic/Focus:  Goals Group:   The focus of this group is to help patients establish daily goals to achieve during treatment and discuss how the patient can incorporate goal setting into their daily lives to aide in recovery. Orientation:   The focus of this group is to educate the patient on the purpose and policies of crisis stabilization and provide a format to answer questions about their admission.  The group details unit policies and expectations of patients while admitted.  Participation Level:  Active  Participation Quality:  Appropriate  Affect:  Appropriate  Cognitive:  Appropriate  Insight: Appropriate  Engagement in Group:  Engaged  Modes of Intervention:  Discussion  Additional Comments:  Pt attended the goals group and remained appropriate and engaged throughout the duration of the group.   Anvay Tennis O 02/19/2024, 5:37 PM

## 2024-02-19 NOTE — BH Assessment (Signed)
.(  Sleep Hours) - 7.25 (Any PRNs that were needed, meds refused, or side effects to meds)- None (Any disturbances and when (visitation, over night)- None (Concerns raised by the patient)- None (SI/HI/AVH)- Denies
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
## 2024-02-19 NOTE — [facility] Group Notes (Signed)
 Spirituality group facilitated by Elia Rockie Sofia, BCC.  Group Description: Group focused on topic of community. Patients participated in facilitated discussion around topic, connecting with one another around experiences and definitions for community. Group members engaged with visual explorer photos, reflecting on what community looks like for them today. Group engaged in discussion around how their definitions of community are present today in hospital.  Modalities: Psycho-social ed, Adlerian, Narrative, MI  Patient Progress: Allison Bolton attended group and participated in group conversation.  She became somewhat agitated by group members who were somewhat distressed.  She shared about her children, grandchildren and great-grandchildren as well as some health concerns that she has.

## 2024-02-19 NOTE — [facility] Group Notes (Signed)
 Psychoeducational Group Note  Date:  02/19/2024 Time:  2032  Group Topic/Focus:  Wrap-Up Group:   The focus of this group is to help patients review their daily goal of treatment and discuss progress on daily workbooks.  Participation Level: Did Not Attend  Participation Quality:  Not Applicable  Affect:  Not Applicable  Cognitive:  Not Applicable  Insight:  Not Applicable  Engagement in Group: Not Applicable  Additional Comments:  The patient did not attend group this evening.   Kristina Bertone S 02/19/2024, 8:32 PM

## 2024-02-19 NOTE — Plan of Care (Signed)
  Problem: Education: Goal: Knowledge of [facility] Education information/materials will improve Outcome: Progressing Goal: Emotional status will improve Outcome: Progressing Goal: Mental status will improve Outcome: Progressing Goal: Verbalization of understanding the information provided will improve Outcome: Progressing   Problem: Activity: Goal: Interest or engagement in activities will improve Outcome: Progressing Goal: Sleeping patterns will improve Outcome: Progressing   Problem: Coping: Goal: Ability to verbalize frustrations and anger appropriately will improve Outcome: Progressing Goal: Ability to demonstrate self-control will improve Outcome: Progressing   Problem: Health Behavior/Discharge Planning: Goal: Identification of resources available to assist in meeting health care needs will improve Outcome: Progressing Goal: Compliance with treatment plan for underlying cause of condition will improve Outcome: Progressing   Problem: Physical Regulation: Goal: Ability to maintain clinical measurements within normal limits will improve Outcome: Progressing   Problem: Safety: Goal: Periods of time without injury will increase Outcome: Progressing   Problem: Education: Goal: Knowledge of [facility] Education information/materials will improve Outcome: Progressing Goal: Emotional status will improve Outcome: Progressing Goal: Mental status will improve Outcome: Progressing Goal: Verbalization of understanding the information provided will improve Outcome: Progressing   Problem: Activity: Goal: Interest or engagement in activities will improve Outcome: Progressing Goal: Sleeping patterns will improve Outcome: Progressing   Problem: Coping: Goal: Ability to verbalize frustrations and anger appropriately will improve Outcome: Progressing Goal: Ability to demonstrate self-control will improve Outcome: Progressing   Problem: Health  Behavior/Discharge Planning: Goal: Identification of resources available to assist in meeting health care needs will improve Outcome: Progressing Goal: Compliance with treatment plan for underlying cause of condition will improve Outcome: Progressing   Problem: Physical Regulation: Goal: Ability to maintain clinical measurements within normal limits will improve Outcome: Progressing   Problem: Safety: Goal: Periods of time without injury will increase Outcome: Progressing   Problem: Activity: Goal: Will identify at least one activity in which they can participate Outcome: Progressing   Problem: Coping: Goal: Ability to identify and develop effective coping behavior will improve Outcome: Progressing Goal: Ability to interact with others will improve Outcome: Progressing Goal: Demonstration of participation in decision-making regarding own care will improve Outcome: Progressing Goal: Ability to use eye contact when communicating with others will improve Outcome: Progressing   Problem: Health Behavior/Discharge Planning: Goal: Identification of resources available to assist in meeting health care needs will improve Outcome: Progressing   Problem: Self-Concept: Goal: Will verbalize positive feelings about self Outcome: Progressing   Problem: Activity: Goal: Will verbalize the importance of balancing activity with adequate rest periods Outcome: Progressing   Problem: Education: Goal: Will be free of psychotic symptoms Outcome: Progressing Goal: Knowledge of the prescribed therapeutic regimen will improve Outcome: Progressing   Problem: Coping: Goal: Coping ability will improve Outcome: Progressing Goal: Will verbalize feelings Outcome: Progressing   Problem: Health Behavior/Discharge Planning: Goal: Compliance with prescribed medication regimen will improve Outcome: Progressing   Problem: Nutritional: Goal: Ability to achieve adequate nutritional intake will  improve Outcome: Progressing   Problem: Role Relationship: Goal: Ability to communicate needs accurately will improve Outcome: Progressing Goal: Ability to interact with others will improve Outcome: Progressing   Problem: Safety: Goal: Ability to redirect hostility and anger into socially appropriate behaviors will improve Outcome: Progressing Goal: Ability to remain free from injury will improve Outcome: Progressing   Problem: Self-Care: Goal: Ability to participate in self-care as condition permits will improve Outcome: Progressing   Problem: Self-Concept: Goal: Will verbalize positive feelings about self Outcome: Progressing

## 2024-02-19 NOTE — [facility] Suicide Risk Assessment (Signed)
 [facility] INPATIENT:  Family/Significant Other Suicide Prevention Education  Suicide Prevention Education:  Education Completed; [name], friend [phone]  (name of family member/significant other) has been identified by the patient as the family member/significant other with whom the patient will be residing, and identified as the person(s) who will aid the patient in the event of a mental health crisis (suicidal ideations/suicide attempt).  With written consent from the patient, the family member/significant other has been provided the following suicide prevention education, prior to the and/or following the discharge of the patient.  CSW received consent to speak pt's friend, [name], who resides in [place] . [name] reported he has known pt since 1995 due to them dating for 2 months. [name] reported although relationship did not last he continued to stay in contact with pt because of his concern for her daughter. [name] reported while pt lived in [place]  she was homeless and used drugs. [name] reported pt connected to a church in [place]  and they sent her back to [place] to be with family. [name] unsure of where pt resides. CSW will continue to follow and update Treatment Team.    The suicide prevention education provided includes the following: Suicide risk factors Suicide prevention and interventions National Suicide Hotline telephone number [facility] assessment telephone number [facility] Emergency Assistance 911 [facility] and/or Residential Mobile Crisis Unit telephone number  Request made of family/significant other to: Remove weapons (e.g., guns, rifles, knives), all items previously/currently identified as safety concern.   Remove drugs/medications (over-the-counter, prescriptions, illicit drugs), all items previously/currently identified as a safety concern.  The family member/significant other verbalizes understanding of the suicide prevention  education information provided.  The family member/significant other agrees to remove the items of safety concern listed above.  Allison Bolton 02/19/2024, 11:34 AM
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
## 2024-02-20 DIAGNOSIS — F312 Bipolar disorder, current episode manic severe with psychotic features: Secondary | ICD-10-CM | POA: Diagnosis not present

## 2024-02-20 MED ORDER — OLANZAPINE 7.5 MG PO TABS: 7.5000 mg | ORAL_TABLET | Freq: Every day | ORAL | Status: DC

## 2024-02-20 MED ORDER — DIVALPROEX SODIUM 500 MG PO DR TAB
750.0000 mg | DELAYED_RELEASE_TABLET | Freq: Two times a day (BID) | ORAL | Status: DC
  Administered 2024-02-20 – 2024-05-08 (×155): 750 mg via ORAL
  Filled 2024-02-20 (×157): qty 1

## 2024-02-20 MED ORDER — ONDANSETRON 4 MG PO TBDP
4.0000 mg | ORAL_TABLET | Freq: Three times a day (TID) | ORAL | Status: DC | PRN
  Administered 2024-02-20 – 2024-05-05 (×17): 4 mg via ORAL
  Filled 2024-02-20 (×17): qty 1

## 2024-02-20 MED ORDER — OLANZAPINE 5 MG PO TABS
5.0000 mg | ORAL_TABLET | Freq: Every day | ORAL | Status: DC
  Administered 2024-02-20 – 2024-02-22 (×3): 5 mg via ORAL
  Filled 2024-02-20 (×3): qty 1

## 2024-02-20 MED ORDER — HALOPERIDOL 5 MG PO TABS
5.0000 mg | ORAL_TABLET | Freq: Two times a day (BID) | ORAL | Status: DC
  Administered 2024-02-20 – 2024-02-21 (×3): 5 mg via ORAL
  Filled 2024-02-20 (×3): qty 1

## 2024-02-20 MED ORDER — DIVALPROEX SODIUM 500 MG PO DR TAB: 750.0000 mg | DELAYED_RELEASE_TABLET | Freq: Two times a day (BID) | ORAL | Status: DC
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
## 2024-02-20 NOTE — Group Note (Signed)
 Date:  02/20/2024 Time:  8:24 PM  Group Topic/Focus:   Wrap-Up Group:   The focus of this group is to help patients review their daily goal of treatment and discuss progress on daily workbooks.  Patient attended Group and Participated.  Participation Level:  Minimal  Participation Quality:  Appropriate  Affect:  Blunted  Cognitive:  Appropriate  Insight: Improving  Engagement in Group:  Improving  Modes of Intervention:  Education  Additional Comments:    Allison Bolton 02/20/2024, 8:24 PM
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
## 2024-02-20 NOTE — Plan of Care (Signed)
   Problem: Education: Goal: Emotional status will improve Outcome: Progressing Goal: Mental status will improve Outcome: Progressing Goal: Verbalization of understanding the information provided will improve Outcome: Progressing   Problem: Activity: Goal: Interest or engagement in activities will improve Outcome: Progressing

## 2024-02-20 NOTE — Progress Notes (Signed)
   02/20/24 1009  Psychosocial Assessment  Patient Complaints Anxiety  Eye Contact Fair  Facial Expression Anxious  Affect Anxious;Preoccupied  Speech Logical/coherent  Interaction Assertive  Motor Activity Tremors (hands)  Appearance/Hygiene Disheveled  Behavior Characteristics Cooperative  Mood Preoccupied  Thought Process  Coherency Tangential;Circumstantial  Content Preoccupation  Delusions None reported or observed  Perception WDL  Hallucination None reported or observed  Judgment Impaired  Confusion None  Danger to Self  Current suicidal ideation? Denies  Danger to Others  Danger to Others None reported or observed
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
## 2024-02-20 NOTE — [facility] Group Notes (Deleted)
 LCSW Group Therapy Note  02/20/2024   10:00am-11:00am  Type of Therapy and Topic:  Group Therapy: Gratitude  Participation Level:  Did Not Attend   Description of Group:   In this group, patients shared and discussed the importance of acknowledging the elements in their lives for which they are grateful and how this can positively impact their mood.  The group discussed how bringing the positive elements of their lives to the forefront of their minds can help with recovery from any illness, physical or mental.  An exercise was done as a group in which a list was made of gratitude items in order to encourage participants to consider other potential positives in their lives.  Therapeutic Goals: Patients will discuss quotes about gratitude and explore how a change of attitude can make life more joyful. Patients will identify one or more items for which they are grateful in each of 6 categories:  people, experiences, things, places, skills, and other. Patients will discuss how it is possible to seek out gratitude in even bad situations. Patients will explore how the lack of gratitude can bring them down.   Summary of Patient Progress:  Patient was invited to group, did not attend.   Therapeutic Modalities:   Solution-Focused Therapy Activity  Mackinzee Roszak J Grossman-Orr, LCSW .

## 2024-02-20 NOTE — Group Note (Signed)
 LCSW Group Therapy Note  Group Date: 02/20/2024 Start Time: 1000 End Time: 1045   Type of Therapy and Topic:  Group Therapy - Healthy vs Unhealthy Coping Skills  Participation Level:  Minimal   Description of Group The focus of this group was to determine what unhealthy coping techniques typically are used by group members and what healthy coping techniques would be helpful in coping with various problems. Patients were guided in becoming aware of the differences between healthy and unhealthy coping techniques. Patients were asked to identify 2-3 healthy coping skills they would like to learn to use more effectively.  Therapeutic Goals Patients learned that coping is what human beings do all day long to deal with various situations in their lives Patients defined and discussed healthy vs unhealthy coping techniques Patients identified their preferred coping techniques and identified whether these were healthy or unhealthy Patients determined 2-3 healthy coping skills they would like to become more familiar with and use more often. Patients provided support and ideas to each other   Summary of Patient Progress:  During group, the patient came in late expressed she wants to go home to grand baby. Patient proved open to input from peers and feedback from CSW. Patient demonstrated limited insight into the subject matter, was respectful of peers, and participated throughout the entire session. Patient left after five mins of engagement   Therapeutic Modalities Cognitive Behavioral Therapy Motivational Interviewing  Allison Bolton O Reiner Loewen, LCSWA 02/20/2024  10:59 AM

## 2024-02-20 NOTE — Progress Notes (Signed)
   02/19/24 2112  Psych Admission Type (Psych Patients Only)  Admission Status Involuntary  Psychosocial Assessment  Patient Complaints Anxiety  Eye Contact Fair  Facial Expression Fixed smile  Affect Anxious;Preoccupied  Speech Pressured  Interaction Assertive  Motor Activity Fidgety;Tremors  Appearance/Hygiene Disheveled  Behavior Characteristics Cooperative;Intrusive  Mood Anxious;Preoccupied;Pleasant  Thought Process  Coherency Circumstantial;Disorganized;Tangential  Content Blaming others;Preoccupation  Delusions None reported or observed  Perception Derealization  Hallucination None reported or observed  Judgment Impaired  Confusion Mild  Danger to Self  Current suicidal ideation? Denies  Agreement Not to Harm Self Yes  Description of Agreement Verbal  Danger to Others  Danger to Others None reported or observed

## 2024-02-20 NOTE — Plan of Care (Signed)
  Problem: Education: Goal: Emotional status will improve 02/20/2024 1016 by Elouise Wolm CROME, RN Outcome: Progressing 02/20/2024 1015 by Elouise Wolm CROME, RN Outcome: Progressing Goal: Mental status will improve 02/20/2024 1016 by Elouise Wolm CROME, RN Outcome: Progressing 02/20/2024 1015 by Elouise Wolm CROME, RN Outcome: Progressing Goal: Verbalization of understanding the information provided will improve 02/20/2024 1016 by Elouise Wolm CROME, RN Outcome: Progressing 02/20/2024 1015 by Elouise Wolm CROME, RN Outcome: Progressing   Problem: Activity: Goal: Interest or engagement in activities will improve 02/20/2024 1016 by Elouise Wolm CROME, RN Outcome: Progressing 02/20/2024 1015 by Elouise Wolm CROME, RN Outcome: Progressing

## 2024-02-21 DIAGNOSIS — F312 Bipolar disorder, current episode manic severe with psychotic features: Secondary | ICD-10-CM | POA: Diagnosis not present

## 2024-02-21 MED ORDER — HALOPERIDOL 5 MG PO TABS
10.0000 mg | ORAL_TABLET | Freq: Two times a day (BID) | ORAL | Status: DC
  Administered 2024-02-21 – 2024-05-08 (×154): 10 mg via ORAL
  Filled 2024-02-21 (×153): qty 2

## 2024-02-21 MED ORDER — LORAZEPAM 1 MG PO TABS
1.0000 mg | ORAL_TABLET | Freq: Two times a day (BID) | ORAL | Status: DC
  Administered 2024-02-21: 1 mg via ORAL
  Filled 2024-02-21: qty 1

## 2024-02-21 MED ORDER — LORAZEPAM 1 MG PO TABS
1.0000 mg | ORAL_TABLET | Freq: Two times a day (BID) | ORAL | Status: DC
  Administered 2024-02-21 – 2024-03-19 (×54): 1 mg via ORAL
  Filled 2024-02-21 (×54): qty 1
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
## 2024-02-21 NOTE — Progress Notes (Signed)
 Pt came out of her room into the hallway very confused and tearful, stating that My nephew is a Veterinary surgeon and I need to speak with him right now about what's going on. This Clinical research associate asked the pt what was bothering her, to which the pt replied I am just going crazy, I don't want to talk about it right now, I just need something to help with my anxiety. Prior to this, pt has been frequently coming to the nurses station, disoriented to time/situation and visibly crying and upset. Verbal redirection attempts were unsuccessful. Pt was given 5 mg zyprexa  PO according to mild agitation protocol, which she accepted. Pt remains safe on the unit at this time and has returned to her room. Will continue to assess.

## 2024-02-21 NOTE — Progress Notes (Signed)
   02/20/24 2222  Psych Admission Type (Psych Patients Only)  Admission Status Involuntary  Psychosocial Assessment  Patient Complaints Anxiety;Worrying  Eye Contact Fair  Facial Expression Anxious  Affect Anxious;Preoccupied  Speech Logical/coherent  Interaction Assertive  Motor Activity Fidgety;Tremors  Appearance/Hygiene Disheveled  Behavior Characteristics Cooperative  Mood Preoccupied  Thought Process  Coherency Circumstantial;Disorganized;Tangential  Content Preoccupation;Blaming others  Delusions None reported or observed  Perception Derealization  Hallucination None reported or observed  Judgment Impaired  Confusion Mild  Danger to Self  Current suicidal ideation? Denies  Agreement Not to Harm Self Yes  Description of Agreement Verbal  Danger to Others  Danger to Others None reported or observed

## 2024-02-21 NOTE — Progress Notes (Signed)
   02/21/24 0750  Psych Admission Type (Psych Patients Only)  Admission Status Involuntary  Psychosocial Assessment  Patient Complaints Anxiety;Worrying  Eye Contact Brief  Facial Expression Anxious  Affect Anxious;Preoccupied  Speech Pressured;Rapid  Interaction Assertive  Motor Activity Fidgety  Appearance/Hygiene Disheveled  Behavior Characteristics Cooperative;Anxious;Fidgety  Mood Preoccupied;Anxious  Thought Process  Coherency Circumstantial;Disorganized;Tangential  Content Preoccupation  Delusions Persecutory  Perception Derealization  Hallucination Auditory  Judgment Impaired  Confusion Mild  Danger to Self  Current suicidal ideation? Denies  Agreement Not to Harm Self Yes  Description of Agreement verbal  Danger to Others  Danger to Others None reported or observed

## 2024-02-21 NOTE — Plan of Care (Signed)
   Problem: Education: Goal: Emotional status will improve Outcome: Progressing Goal: Mental status will improve Outcome: Progressing Goal: Verbalization of understanding the information provided will improve Outcome: Progressing   Problem: Activity: Goal: Interest or engagement in activities will improve Outcome: Progressing

## 2024-02-21 NOTE — Progress Notes (Signed)
(  Sleep Hours) - 6.5 (Any PRNs that were needed, meds refused, or side effects to meds)- PRN zofran  4 mg given for nausea and tylenol  650 mg given for headache at pt request. Zyprexa  5 mg given per agitation protocol, see progress note. No meds refused.  (Any disturbances and when (visitation, over night)- See progress note  (Concerns raised by the patient)- None  (SI/HI/AVH)- Denies SI/HI/AVH

## 2024-02-22 NOTE — Progress Notes (Addendum)
 Shift Note  (Sleep Hours) - 5.5  (Any PRNs that were needed, meds refused, or side effects to meds)- PO PRN Maalox, Hydroxyzine , Zofran  and Trazodone   (Any disturbances and when (visitation, over night)-None  (Concerns raised by the patient)- Disorganized. Keeps repeating she has to leave to save her sons  (SI/HI/AVH)- Denies
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
## 2024-02-22 NOTE — Group Note (Signed)
 LCSW Group Therapy Note   Group Date: 02/22/2024 Start Time: 1300 End Time: 1400   Participation:  did not attend  Type of Therapy:  Group Therapy  Title:  Speaking from the Heart: Communicating with Understanding and Empathy  Objective:  To help participants develop effective communication skills to express themselves clearly, listen actively, and navigate conflicts in a healthy way.  Goals: Increase awareness of verbal and non-verbal communication skills. Practice using "I" statements and active listening techniques. Learn coping strategies for managing communication stress.  Summary:  Participants explored the importance of communication, discussed challenges, and practiced skills such as active listening and assertive expression. They reflected on past experiences and identified ways to improve communication in their daily lives.  Therapeutic Modalities: Cognitive-Behavioral Therapy (CBT): Restructuring negative thought patterns in communication. Mindfulness: Staying present and calm during conversations. Psychoeducation: Learning about effective communication techniques.   Arless Vineyard O Shakinah Navis, LCSWA 02/22/2024  5:55 PM

## 2024-02-22 NOTE — Progress Notes (Signed)
   02/22/24 1100  Psych Admission Type (Psych Patients Only)  Admission Status Involuntary  Psychosocial Assessment  Patient Complaints Anxiety;Worrying  Eye Contact Fair  Facial Expression Anxious  Affect Preoccupied  Speech Rapid;Pressured  Actuary Activity Fidgety  Appearance/Hygiene Disheveled  Behavior Characteristics Anxious  Mood Anxious;Preoccupied  Thought Process  Coherency Disorganized;Flight of ideas  Content Delusions  Delusions Persecutory  Perception Derealization  Hallucination None reported or observed  Judgment Poor  Confusion Mild  Danger to Self  Current suicidal ideation? Denies  Danger to Others  Danger to Others None reported or observed

## 2024-02-22 NOTE — Progress Notes (Addendum)
 Conversation with patient:  CSW attempted to to discuss ACTT services with patient, however patient appeared to be experiencing delusional thoughts and was unable to meaningfully participate in the conversation.  CSW will attempt another day.   Shianne Zeiser, LCSWA 02/22/2024

## 2024-02-22 NOTE — Progress Notes (Signed)
(  Sleep Hours) -6.25 (Any PRNs that were needed, meds refused, or side effects to meds)-prn zofran  4mg , hydroxyzine  25mg , and tylenol  650mg s given @2258   (Any disturbances and when (visitation, over night)-none (Concerns raised by the patient)- none (SI/HI/AVH)- Denies all  0245 Patient awake crying in bed that she needs to go to [place]  with her family

## 2024-02-22 NOTE — Plan of Care (Signed)
   Problem: Education: Goal: Emotional status will improve Outcome: Not Progressing Goal: Mental status will improve Outcome: Not Progressing

## 2024-02-22 NOTE — Group Note (Signed)
 Date:  02/22/2024 Time:  8:28 PM  Group Topic/Focus:  Wrap-Up Group:   The focus of this group is to help patients review their daily goal of treatment and discuss progress on daily workbooks.    Participation Level:  Did Not Attend  Participation Quality:  none  Affect:  none  Cognitive:  none  Insight: None  Engagement in Group:  None  Modes of Intervention:  none  Additional Comments:   Pt was encouraged but refused to attend wrap up group  Solana Coggin A Edina Winningham 02/22/2024, 8:28 PM

## 2024-02-22 NOTE — Plan of Care (Signed)
   Problem: Education: Goal: Knowledge of [place] [facility] Education information/materials will improve Outcome: Progressing Goal: Emotional status will improve Outcome: Progressing Goal: Mental status will improve Outcome: Progressing

## 2024-02-23 DIAGNOSIS — F312 Bipolar disorder, current episode manic severe with psychotic features: Secondary | ICD-10-CM | POA: Diagnosis not present

## 2024-02-23 MED ORDER — OLANZAPINE 5 MG PO TBDP
5.0000 mg | ORAL_TABLET | Freq: Every day | ORAL | Status: DC
  Administered 2024-02-23: 5 mg via ORAL
  Filled 2024-02-23: qty 1

## 2024-02-23 NOTE — Plan of Care (Signed)
   Problem: Education: Goal: Knowledge of [place] [facility] Education information/materials will improve Outcome: Progressing Goal: Emotional status will improve Outcome: Progressing Goal: Mental status will improve Outcome: Progressing Goal: Verbalization of understanding the information provided will improve Outcome: Progressing

## 2024-02-23 NOTE — [facility] Group Notes (Signed)
 Adult Psychoeducational Group Note  Date:  02/23/2024 Time:  8:30 PM  Group Topic/Focus:  Wrap-Up Group:   The focus of this group is to help patients review their daily goal of treatment and discuss progress on daily workbooks.  Participation Level:  Did Not Attend  Participation Quality:    Affect:    Cognitive:   Insight: None  Engagement in Group:    Modes of Intervention:    Additional Comments:  Pt did not attend group.  Drue Pouch 02/23/2024, 8:30 PM

## 2024-02-23 NOTE — Progress Notes (Signed)
 [facility] MD Progress Note  02/23/2024 12:12 PM Allison Bolton  MRN:  [id number]  Principal Problem: Bipolar affective disorder, current episode manic with psychotic symptoms (HCC) Diagnosis: Principal Problem:   Bipolar affective disorder, current episode manic with psychotic symptoms (HCC)   Reason for Admission:  Allison Bolton is a 53-yr-old female with a past psychiatric history of substance-induced psychosis, bipolar I disorder, stimulant use disorder, GAD who presented on 02/17/24 to [facility] ED with a chief complaint of anxiety. In the ED, she was found to be acutely psychotic with tangential speech, agitation, and delusional thought content. Patient was admitted involuntarily to [facility] for further evaluation and management (admitted on 02/18/2024, total  LOS: 5 days )   ASSESSMENT: Patient remains disorganized, tangential, delusional, and intermittently agitated. Methamphetamine abuse likely is significantly impacting patient's mental health and unclear whether patient's substance-induced psychosis has exacerbated into schizoaffective disorder. Unclear what her psychiatric baseline is at this time but appears to be compliant with medications. Although, patient has not been noted by staff to be cheeking medications, we will switch her Zyprexa  to ODT formulation and monitor for any improvement in psychotic symptoms. Will continue on haloperidol , Depakote , ativan  for now. No further medications changes today.  PLAN: #Substance-induced psychosis #Bipolar disorder -- Continue haloperidol  10 mg bid -- Continue olanzapine  5 mg at bedtime -- Continue ativan  1 mg bid  -- Continue Depakote  DR 750 mg q12h - Trazodone  50 mg at bedtime for insomnia -- Discontinued prolixin   Disposition Planning: --Estimated LOS: 14-21 days --Estimated Discharge Date: TBD --Barriers to Discharge: psychosis --Discharge Goals: Return home with outpatient referrals for mental health follow-up including  medication management/psychotherapy  Subjective: The patient was seen and evaluated on the unit. On assessment today, patient states she needs to leave to get to [place] . Says she has a  grandchild in [place]  that's about to die, and she needs to see him. She goes on to say she has diarrhea because she had a seizure and got colon cancer. She frantically asks how long she will be in the hospital. When told that it's too early to tell, patient becomes irritated and states it's going to be a month again. She then gets up from bed, somewhat agitated and continues saying how she needs to leave immediately.   Objective: Chart Review from last 24 hours:  The patient's chart was reviewed and nursing notes were reviewed. The patient's case was discussed in multidisciplinary team meeting.  - Overnight events to report per chart review / staff report: none - Patient took all prescribed medications: yes - Patient received the following PRNs: Zofran , atarax , tylenol   Current Medications: Current Facility-Administered Medications  Medication Dose Route Frequency Provider Last Rate Last Admin   acetaminophen  (TYLENOL ) tablet 650 mg  650 mg Oral Q6H PRN White, Patrice L, NP   650 mg at 02/22/24 2256   alum & mag hydroxide-simeth (MAALOX/MYLANTA) 200-200-20 MG/5ML suspension 30 mL  30 mL Oral Q4H PRN White, Patrice L, NP   30 mL at 02/22/24 1459   benztropine  (COGENTIN ) tablet 1 mg  1 mg Oral BID White, Patrice L, NP   1 mg at 02/23/24 0844   divalproex  (DEPAKOTE ) DR tablet 750 mg  750 mg Oral Q12H Ji, Andrew, MD   750 mg at 02/23/24 0843   haloperidol  (HALDOL ) tablet 10 mg  10 mg Oral Q12H Lynnette Barter, MD   10 mg at 02/23/24 0843   hydrOXYzine  (ATARAX ) tablet 25 mg  25 mg Oral TID PRN  White, Patrice L, NP   25 mg at 02/23/24 0845   LORazepam  (ATIVAN ) tablet 1 mg  1 mg Oral Q12H Lynnette Barter, MD   1 mg at 02/23/24 9156   magnesium  hydroxide (MILK OF MAGNESIA) suspension 30 mL  30 mL Oral Daily PRN White,  Patrice L, NP       nicotine  polacrilex (NICORETTE ) gum 2 mg  2 mg Oral PRN Trudy Carwin, NP   2 mg at 02/20/24 9197   OLANZapine  (ZYPREXA ) injection 10 mg  10 mg Intramuscular TID PRN White, Patrice L, NP       OLANZapine  (ZYPREXA ) injection 5 mg  5 mg Intramuscular TID PRN White, Patrice L, NP       OLANZapine  (ZYPREXA ) tablet 5 mg  5 mg Oral QHS Lynnette Barter, MD   5 mg at 02/22/24 2049   OLANZapine  zydis (ZYPREXA ) disintegrating tablet 5 mg  5 mg Oral TID PRN Teresa Jes L, NP   5 mg at 02/21/24 0317   ondansetron  (ZOFRAN -ODT) disintegrating tablet 4 mg  4 mg Oral Q8H PRN Lynnette Barter, MD   4 mg at 02/22/24 2256   traZODone  (DESYREL ) tablet 50 mg  50 mg Oral QHS White, Patrice L, NP   50 mg at 02/22/24 2049    Lab Results: No results found for this or any previous visit (from the past 48 hours).  Blood Alcohol level:  Lab Results  Component Value Date   [facility] <15 02/17/2024   ETH <15 01/14/2024    Metabolic Labs: Lab Results  Component Value Date   HGBA1C 5.1 01/18/2024   MPG 99.67 01/18/2024   MPG 105.41 05/11/2019   Lab Results  Component Value Date   PROLACTIN 24.1 (H) 05/11/2019   Lab Results  Component Value Date   CHOL 154 01/18/2024   TRIG 180 (H) 01/18/2024   HDL 49 01/18/2024   CHOLHDL 3.1 01/18/2024   VLDL 36 01/18/2024   LDLCALC 69 01/18/2024   LDLCALC 82 05/11/2019    Physical Findings: AIMS: No  CIWA:    COWS:     Psychiatric Specialty Exam: General Appearance: Disheveled   Eye Contact: Fair   Speech: Pressured   Volume: Normal   Mood: Labile   Affect: Labile   Thought Content: Tangential; Illogical; Delusions   Suicidal Thoughts: Suicidal Thoughts: No    Homicidal Thoughts: Homicidal Thoughts: No    Thought Process: Disorganized   Orientation: Partial     Memory: Immediate Poor; Recent Poor   Judgment: Poor   Insight: Poor   Concentration: Poor   Recall: Poor   Fund of Knowledge: Fair   Language: Fair   Psychomotor  Activity: Psychomotor Activity: Restlessness    Assets: Social Support   Sleep: Sleep: Fair     Review of Systems Review of Systems  Gastrointestinal:  Negative for abdominal pain, constipation, diarrhea, nausea and vomiting.  Neurological:  Negative for dizziness and headaches.    Vital Signs: Blood pressure 110/80, pulse 84, temperature 97.9 F (36.6 C), resp. rate 20, height 5' 6 (1.676 m), weight 80.7 kg, SpO2 98%. Body mass index is 28.71 kg/m. Physical Exam Vitals and nursing note reviewed.  Constitutional:      General: She is not in acute distress.    Appearance: Normal appearance. She is normal weight. She is not ill-appearing.  Pulmonary:     Effort: Pulmonary effort is normal.  Neurological:     General: No focal deficit present.     Mental Status: She  is alert. She is disoriented.    I certify that inpatient services furnished can reasonably be expected to improve the patient's condition.   Signed: Ashley LOISE Gravely, MD 02/23/2024, 12:12 PM

## 2024-02-23 NOTE — Plan of Care (Signed)
   Problem: Education: Goal: Knowledge of [place] [facility] Education information/materials will improve Outcome: Progressing Goal: Emotional status will improve Outcome: Progressing Goal: Mental status will improve Outcome: Progressing Goal: Verbalization of understanding the information provided will improve Outcome: Progressing   Problem: Safety: Goal: Periods of time without injury will increase Outcome: Progressing
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
## 2024-02-24 ENCOUNTER — Encounter (HOSPITAL_COMMUNITY): Payer: Self-pay

## 2024-02-24 DIAGNOSIS — F312 Bipolar disorder, current episode manic severe with psychotic features: Secondary | ICD-10-CM | POA: Diagnosis not present

## 2024-02-24 LAB — VALPROIC ACID LEVEL: Valproic Acid Lvl: 89 ug/mL (ref 50–100)

## 2024-02-24 MED ORDER — OLANZAPINE 5 MG PO TBDP
5.0000 mg | ORAL_TABLET | Freq: Two times a day (BID) | ORAL | Status: DC
  Administered 2024-02-24 – 2024-02-26 (×4): 5 mg via ORAL
  Filled 2024-02-24 (×4): qty 1

## 2024-02-24 NOTE — Group Note (Signed)
 Date:  02/24/2024 Time:  8:22 PM  Group Topic/Focus:  Conflict Resolution:   The focus of this group is to discuss the conflict resolution process and how it may be used upon discharge.    Participation Level:  Minimal  Participation Quality:  Appropriate  Affect:  Appropriate  Cognitive:  Oriented  Insight: Good  Engagement in Group:  Engaged  Modes of Intervention:  Reality Testing  Additional Comments:     Allison Bolton 02/24/2024, 8:22 PM

## 2024-02-24 NOTE — Progress Notes (Signed)
 [facility] MD Progress Note  02/24/2024 10:08 AM Allison Bolton  MRN:  [id number]  Principal Problem: Bipolar affective disorder, current episode manic with psychotic symptoms (HCC) Diagnosis: Principal Problem:   Bipolar affective disorder, current episode manic with psychotic symptoms (HCC)   Reason for Admission:  Allison Bolton is a 53-yr-old female with a past psychiatric history of substance-induced psychosis, bipolar I disorder, stimulant use disorder, GAD who presented on 02/17/24 to [facility] ED with a chief complaint of anxiety. In the ED, she was found to be acutely psychotic with tangential speech, agitation, and delusional thought content. Patient was admitted involuntarily to [facility] for further evaluation and management (admitted on 02/18/2024, total  LOS: 6 days )   ASSESSMENT: Patient remains disorganized, tangential, delusional, and intermittently agitated and has had not appear to have any symptom improvement since admission despite being on therapeutic doses of haldol  and Depakote . We will plan to increase her Zyprexa  dose from 5 mg QD to 5 mg BID. Will continue on current doses of haloperidol , depakote , and ativan  for now.  PLAN: #Substance-induced psychosis #Bipolar disorder -- Continue haloperidol  10 mg bid -- Increase olanzapine  from 5 mg to 10 mg at bedtime -- Continue ativan  1 mg bid  -- Continue Depakote  DR 750 mg q12h  --VPA level on 8/20 was 89 - Trazodone  50 mg at bedtime for insomnia -- Discontinued prolixin   Disposition Planning: --Estimated LOS: 14-21 days --Estimated Discharge Date: TBD --Barriers to Discharge: psychosis --Discharge Goals: Return home with outpatient referrals for mental health follow-up including medication management/psychotherapy  Subjective: The patient was seen and evaluated on the unit. On assessment today, patient continues to endorse delusions about having several grandchildren, and needing to leave to [place]  to save her  grandchildren. States she has a grandchild who is 52 years old. States she does not want to be in [place] . She says her funeral is being held in [place], [place] today and she needs to leave the hospital to go to her own funeral. Patient is emotionally labile, and tearful during interview. When asked about SI, she responds No, why would I do that for?  Objective: Chart Review from last 24 hours:  The patient's chart was reviewed and nursing notes were reviewed. The patient's case was discussed in multidisciplinary team meeting.  - Overnight events to report per chart review / staff report: none - Patient took all prescribed medications: yes - Patient received the following PRNs: Zofran , atarax , tylenol   Current Medications: Current Facility-Administered Medications  Medication Dose Route Frequency Provider Last Rate Last Admin   acetaminophen  (TYLENOL ) tablet 650 mg  650 mg Oral Q6H PRN White, Patrice L, NP   650 mg at 02/22/24 2256   alum & mag hydroxide-simeth (MAALOX/MYLANTA) 200-200-20 MG/5ML suspension 30 mL  30 mL Oral Q4H PRN White, Patrice L, NP   30 mL at 02/22/24 1459   benztropine  (COGENTIN ) tablet 1 mg  1 mg Oral BID White, Patrice L, NP   1 mg at 02/24/24 0750   divalproex  (DEPAKOTE ) DR tablet 750 mg  750 mg Oral Q12H Ji, Andrew, MD   750 mg at 02/24/24 0750   haloperidol  (HALDOL ) tablet 10 mg  10 mg Oral Q12H Ji, Andrew, MD   10 mg at 02/24/24 0750   hydrOXYzine  (ATARAX ) tablet 25 mg  25 mg Oral TID PRN White, Patrice L, NP   25 mg at 02/23/24 1711   LORazepam  (ATIVAN ) tablet 1 mg  1 mg Oral Q12H Lynnette Barter, MD  1 mg at 02/24/24 0750   magnesium  hydroxide (MILK OF MAGNESIA) suspension 30 mL  30 mL Oral Daily PRN White, Patrice L, NP       nicotine  polacrilex (NICORETTE ) gum 2 mg  2 mg Oral PRN Trudy Carwin, NP   2 mg at 02/23/24 1823   OLANZapine  (ZYPREXA ) injection 10 mg  10 mg Intramuscular TID PRN White, Patrice L, NP       OLANZapine  (ZYPREXA ) injection 5 mg  5 mg  Intramuscular TID PRN White, Patrice L, NP       OLANZapine  zydis (ZYPREXA ) disintegrating tablet 5 mg  5 mg Oral TID PRN White, Patrice L, NP   5 mg at 02/21/24 0317   OLANZapine  zydis (ZYPREXA ) disintegrating tablet 5 mg  5 mg Oral QHS Richie Bonanno N, MD   5 mg at 02/23/24 2043   ondansetron  (ZOFRAN -ODT) disintegrating tablet 4 mg  4 mg Oral Q8H PRN Lynnette Barter, MD   4 mg at 02/22/24 2256   traZODone  (DESYREL ) tablet 50 mg  50 mg Oral QHS White, Patrice L, NP   50 mg at 02/23/24 2043    Lab Results:  Results for orders placed or performed during the hospital encounter of 02/18/24 (from the past 48 hours)  Valproic  acid level     Status: None   Collection Time: 02/24/24  6:17 AM  Result Value Ref Range   Valproic  Acid Lvl 89 50 - 100 ug/mL    Comment: Performed at [facility], [address], [place], [place] [address]    Blood Alcohol level:  Lab Results  Component Value Date   [facility] <15 02/17/2024   ETH <15 01/14/2024    Metabolic Labs: Lab Results  Component Value Date   HGBA1C 5.1 01/18/2024   MPG 99.67 01/18/2024   MPG 105.41 05/11/2019   Lab Results  Component Value Date   PROLACTIN 24.1 (H) 05/11/2019   Lab Results  Component Value Date   CHOL 154 01/18/2024   TRIG 180 (H) 01/18/2024   HDL 49 01/18/2024   CHOLHDL 3.1 01/18/2024   VLDL 36 01/18/2024   LDLCALC 69 01/18/2024   LDLCALC 82 05/11/2019    Physical Findings: AIMS: No  CIWA:    COWS:     Psychiatric Specialty Exam: General Appearance: Disheveled   Eye Contact: Minimal   Speech: Pressured   Volume: Normal   Mood: Labile   Affect: Labile   Thought Content: Illogical; Tangential   Suicidal Thoughts: Suicidal Thoughts: No     Homicidal Thoughts: Homicidal Thoughts: No     Thought Process: Disorganized   Orientation: Partial     Memory: Immediate Poor   Judgment: Poor   Insight: Poor   Concentration: Poor   Recall: Poor   Fund of Knowledge: Fair    Language: Fair   Psychomotor Activity: Psychomotor Activity: Restlessness     Assets: Social Support   Sleep: Sleep: Fair      Review of Systems Review of Systems  Gastrointestinal:  Negative for abdominal pain, constipation, diarrhea, nausea and vomiting.  Neurological:  Negative for dizziness and headaches.    Vital Signs: Blood pressure 108/81, pulse 90, temperature 97.6 F (36.4 C), resp. rate 20, height 5' 6 (1.676 m), weight 80.7 kg, SpO2 100%. Body mass index is 28.71 kg/m. Physical Exam Vitals and nursing note reviewed.  Constitutional:      General: She is not in acute distress.    Appearance: Normal appearance. She is normal weight. She  is not ill-appearing.  Pulmonary:     Effort: Pulmonary effort is normal.  Neurological:     General: No focal deficit present.     Mental Status: She is alert. She is disoriented.    I certify that inpatient services furnished can reasonably be expected to improve the patient's condition.   Signed: Ashley LOISE Gravely, MD 02/24/2024, 10:08 AM

## 2024-02-24 NOTE — Progress Notes (Signed)
   02/24/24 2300  Psych Admission Type (Psych Patients Only)  Admission Status Involuntary  Psychosocial Assessment  Patient Complaints Irritability  Eye Contact Fair  Facial Expression Anxious  Affect Sad  Speech Pressured  Interaction Assertive  Motor Activity Fidgety;Restless  Appearance/Hygiene Body odor;Disheveled  Behavior Characteristics Cooperative  Mood Preoccupied  Aggressive Behavior  Effect No apparent injury  Thought Process  Coherency Circumstantial;Disorganized  Content Blaming others  Delusions None reported or observed  Perception Derealization  Hallucination None reported or observed  Judgment Poor  Confusion Mild  Danger to Self  Current suicidal ideation? Denies

## 2024-02-24 NOTE — Progress Notes (Signed)
(  Sleep Hours) -8.5 (Any PRNs that were needed, meds refused, or side effects to meds)- none (Any disturbances and when (visitation, over night)-none (Concerns raised by the patient)- none (SI/HI/AVH)- Denies all

## 2024-02-24 NOTE — Plan of Care (Signed)
 ?  Problem: Education: ?Goal: Emotional status will improve ?Outcome: Progressing ?Goal: Mental status will improve ?Outcome: Progressing ?Goal: Verbalization of understanding the information provided will improve ?Outcome: Progressing ?  ?Problem: Activity: ?Goal: Sleeping patterns will improve ?Outcome: Progressing ?  ?
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
## 2024-02-25 DIAGNOSIS — F312 Bipolar disorder, current episode manic severe with psychotic features: Secondary | ICD-10-CM | POA: Diagnosis not present

## 2024-02-25 NOTE — Progress Notes (Signed)
   02/25/24 0800  Psych Admission Type (Psych Patients Only)  Admission Status Involuntary  Psychosocial Assessment  Patient Complaints Irritability  Eye Contact Fair  Facial Expression Anxious  Affect Preoccupied  Speech Pressured  Interaction Assertive  Motor Activity Restless  Appearance/Hygiene Disheveled;Body odor  Behavior Characteristics Cooperative  Mood Preoccupied  Thought Process  Coherency Disorganized  Content Blaming others  Delusions None reported or observed  Perception Derealization  Hallucination None reported or observed  Judgment Poor  Confusion Mild  Danger to Self  Current suicidal ideation? Denies  Danger to Others  Danger to Others None reported or observed

## 2024-02-25 NOTE — Plan of Care (Signed)
  Problem: Education: Goal: Knowledge of [facility] Education information/materials will improve Outcome: Progressing Goal: Emotional status will improve Outcome: Progressing Goal: Mental status will improve Outcome: Progressing Goal: Verbalization of understanding the information provided will improve Outcome: Progressing   Problem: Activity: Goal: Interest or engagement in activities will improve Outcome: Progressing Goal: Sleeping patterns will improve Outcome: Progressing   Problem: Coping: Goal: Ability to verbalize frustrations and anger appropriately will improve Outcome: Progressing Goal: Ability to demonstrate self-control will improve Outcome: Progressing   Problem: Health Behavior/Discharge Planning: Goal: Identification of resources available to assist in meeting health care needs will improve Outcome: Progressing Goal: Compliance with treatment plan for underlying cause of condition will improve Outcome: Progressing   Problem: Physical Regulation: Goal: Ability to maintain clinical measurements within normal limits will improve Outcome: Progressing   Problem: Safety: Goal: Periods of time without injury will increase Outcome: Progressing   Problem: Education: Goal: Knowledge of [place] [facility] Education information/materials will improve Outcome: Progressing Goal: Emotional status will improve Outcome: Progressing Goal: Mental status will improve Outcome: Progressing Goal: Verbalization of understanding the information provided will improve Outcome: Progressing   Problem: Activity: Goal: Interest or engagement in activities will improve Outcome: Progressing Goal: Sleeping patterns will improve Outcome: Progressing   Problem: Coping: Goal: Ability to verbalize frustrations and anger appropriately will improve Outcome: Progressing Goal: Ability to demonstrate self-control will improve Outcome: Progressing   Problem: Health  Behavior/Discharge Planning: Goal: Identification of resources available to assist in meeting health care needs will improve Outcome: Progressing Goal: Compliance with treatment plan for underlying cause of condition will improve Outcome: Progressing   Problem: Physical Regulation: Goal: Ability to maintain clinical measurements within normal limits will improve Outcome: Progressing   Problem: Safety: Goal: Periods of time without injury will increase Outcome: Progressing   Problem: Activity: Goal: Will identify at least one activity in which they can participate Outcome: Progressing   Problem: Coping: Goal: Ability to identify and develop effective coping behavior will improve Outcome: Progressing Goal: Ability to interact with others will improve Outcome: Progressing Goal: Demonstration of participation in decision-making regarding own care will improve Outcome: Progressing Goal: Ability to use eye contact when communicating with others will improve Outcome: Progressing   Problem: Health Behavior/Discharge Planning: Goal: Identification of resources available to assist in meeting health care needs will improve Outcome: Progressing   Problem: Self-Concept: Goal: Will verbalize positive feelings about self Outcome: Progressing   Problem: Activity: Goal: Will verbalize the importance of balancing activity with adequate rest periods Outcome: Progressing   Problem: Education: Goal: Will be free of psychotic symptoms Outcome: Progressing Goal: Knowledge of the prescribed therapeutic regimen will improve Outcome: Progressing   Problem: Coping: Goal: Coping ability will improve Outcome: Progressing Goal: Will verbalize feelings Outcome: Progressing   Problem: Health Behavior/Discharge Planning: Goal: Compliance with prescribed medication regimen will improve Outcome: Progressing   Problem: Nutritional: Goal: Ability to achieve adequate nutritional intake will  improve Outcome: Progressing   Problem: Role Relationship: Goal: Ability to communicate needs accurately will improve Outcome: Progressing Goal: Ability to interact with others will improve Outcome: Progressing   Problem: Safety: Goal: Ability to redirect hostility and anger into socially appropriate behaviors will improve Outcome: Progressing Goal: Ability to remain free from injury will improve Outcome: Progressing   Problem: Self-Care: Goal: Ability to participate in self-care as condition permits will improve Outcome: Progressing   Problem: Self-Concept: Goal: Will verbalize positive feelings about self Outcome: Progressing

## 2024-02-25 NOTE — Progress Notes (Signed)
(  Sleep Hours) -8.5 (Any PRNs that were needed, meds refused, or side effects to meds)- none (Any disturbances and when (visitation, over night)-none (Concerns raised by the patient)- none (SI/HI/AVH)-AVH

## 2024-02-25 NOTE — Progress Notes (Signed)
   02/25/24 2015  Psych Admission Type (Psych Patients Only)  Admission Status Involuntary  Psychosocial Assessment  Patient Complaints Irritability  Eye Contact Fair  Facial Expression Anxious  Affect Sad  Speech Pressured  Interaction Assertive  Motor Activity Fidgety;Restless  Appearance/Hygiene Body odor;Disheveled  Behavior Characteristics Cooperative  Mood Preoccupied  Aggressive Behavior  Effect No apparent injury  Thought Process  Coherency Circumstantial;Disorganized  Content Blaming others  Delusions None reported or observed  Perception Derealization  Hallucination None reported or observed  Judgment Poor  Confusion Mild  Danger to Self  Current suicidal ideation? Denies

## 2024-02-25 NOTE — [facility] Group Notes (Signed)
 [facility] Group Notes:  (Nursing/MHT/Case Management/Adjunct)  Date:  02/25/2024  Time:  8:26 PM  Type of Therapy:  Psychoeducational Skills  Participation Level:  Minimal  Participation Quality:  Attentive  Affect:  Blunted  Cognitive:  Appropriate  Insight:  Limited  Engagement in Group:  Developing/Improving  Modes of Intervention:  Education  Summary of Progress/Problems: Patient rated her day as an 8 out of 10. Patient states that she slept during the day time. She had no goal for today.   Allison Bolton S 02/25/2024, 8:26 PM

## 2024-02-25 NOTE — Plan of Care (Signed)
  Problem: Education: Goal: Knowledge of [place] [facility] Education information/materials will improve Outcome: Progressing Goal: Mental status will improve Outcome: Progressing   Problem: Health Behavior/Discharge Planning: Goal: Identification of resources available to assist in meeting health care needs will improve Outcome: Progressing Goal: Compliance with treatment plan for underlying cause of condition will improve Outcome: Progressing

## 2024-02-25 NOTE — Group Note (Signed)
 Occupational Therapy Group Note  Group Topic: Sleep Hygiene  Group Date: 02/25/2024 Start Time: 1530 End Time: 1550 Facilitators: Dot [place] MATSU, OT   Group Description: Group encouraged increased participation and engagement through topic focused on sleep hygiene. Patients reflected on the quality of sleep they typically receive and identified areas that need improvement. Group was given background information on sleep and sleep hygiene, including common sleep disorders. Group members also received information on how to improve one's sleep and introduced a sleep diary as a tool that can be utilized to track sleep quality over a length of time. Group session ended with patients identifying one or more strategies they could utilize or implement into their sleep routine in order to improve overall sleep quality.        Therapeutic Goal(s):  Identify one or more strategies to improve overall sleep hygiene  Identify one or more areas of sleep that are negatively impacted (sleep too much, too little, etc)     Participation Level: Hyperverbal   Participation Quality: Moderate Cues   Behavior: Distracted   Speech/Thought Process: Distracted   Affect/Mood: Flat   Insight: Limited   Judgement: Lacking      Modes of Intervention: Education  Patient Response to Interventions:  Disengaged   Plan: Continue to engage patient in OT groups 2 - 3x/week.  02/25/2024  [place] MATSU Dot, OT  Allison Bolton, OT
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
## 2024-02-26 DIAGNOSIS — F312 Bipolar disorder, current episode manic severe with psychotic features: Secondary | ICD-10-CM | POA: Diagnosis not present

## 2024-02-26 MED ORDER — OLANZAPINE 10 MG PO TBDP
10.0000 mg | ORAL_TABLET | Freq: Two times a day (BID) | ORAL | Status: DC
  Administered 2024-02-26 – 2024-02-29 (×6): 10 mg via ORAL
  Filled 2024-02-26 (×6): qty 1

## 2024-02-26 NOTE — B[facility] Group Notes (Signed)
 Adult Psychoeducational Group Note  Date:  02/26/2024 Time:  7:03 PM  Group Topic/Focus:  Goals Group:   The focus of this group is to help patients establish daily goals to achieve during treatment and discuss how the patient can incorporate goal setting into their daily lives to aide in recovery. Orientation:   The focus of this group is to educate the patient on the purpose and policies of crisis stabilization and provide a format to answer questions about their admission.  The group details unit policies and expectations of patients while admitted.  Participation Level:  Did Not Attend  Participation Quality:    Affect:    Cognitive:    Insight:   Engagement in Group:    Modes of Intervention:    Additional Comments:    Jamelle Cassondra KIDD 02/26/2024, 7:03 PM

## 2024-02-26 NOTE — Progress Notes (Signed)
(  Sleep Hours) -7.75 (Any PRNs that were needed, meds refused, or side effects to meds)- Zofran  (Any disturbances and when (visitation, over night)- (Concerns raised by the patient)- none (SI/HI/AVH)-Denied

## 2024-02-26 NOTE — Plan of Care (Signed)
   Problem: Education: Goal: Emotional status will improve Outcome: Progressing Goal: Mental status will improve Outcome: Progressing   Problem: Activity: Goal: Interest or engagement in activities will improve Outcome: Progressing

## 2024-02-26 NOTE — Progress Notes (Signed)
   02/26/24 0915  Psych Admission Type (Psych Patients Only)  Admission Status Involuntary  Psychosocial Assessment  Patient Complaints Irritability  Eye Contact Fair  Facial Expression Anxious  Affect Sad  Speech Pressured  Interaction Assertive  Motor Activity Fidgety  Appearance/Hygiene Body odor;Disheveled  Behavior Characteristics Cooperative  Mood Preoccupied  Thought Process  Coherency Circumstantial;Disorganized  Content Blaming others  Delusions None reported or observed  Perception Derealization  Hallucination None reported or observed  Judgment Poor  Confusion Mild  Danger to Self  Current suicidal ideation? Denies  Danger to Others  Danger to Others None reported or observed

## 2024-02-26 NOTE — B[facility] Group Notes (Signed)

## 2024-02-26 NOTE — Group Note (Signed)
 Date:  02/26/2024 Time:  8:42 PM  Group Topic/Focus:  Wrap-Up Group:   The focus of this group is to help patients review their daily goal of treatment and discuss progress on daily workbooks.    Participation Level:  Active  Participation Quality:  Appropriate  Affect:  Appropriate  Cognitive:  Appropriate  Insight: Appropriate  Engagement in Group:  Developing/Improving  Modes of Intervention:  Discussion  Additional Comments:  Pt stated her goal for today was to focus on her treatment plan. Pt stated she accomplished her goal today. Pt stated she spoke with her doctor and with her social worker about her care today. Pt rated her overall day a 10. Pt stated she made no calls today.  Pt stated she felt better about herself tonight. Pt stated she was able to attend all meals today. Pt stated she took all medications provided today. Pt stated her appetite was pretty good today. Pt rated her sleep last night was pretty good. Pt stated the goal tonight was to get some rest. Pt stated she had no physical pain tonight. Pt deny visual hallucinations and auditory issues tonight. Pt denies thoughts of harming herself or others. Pt stated she would alert staff if anything changed.  Allison Bolton 02/26/2024, 8:42 PM

## 2024-02-26 NOTE — Plan of Care (Signed)
   Problem: Education: Goal: Knowledge of [place] [facility] Education information/materials will improve Outcome: Progressing Goal: Emotional status will improve Outcome: Progressing Goal: Mental status will improve Outcome: Progressing
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
## 2024-02-27 DIAGNOSIS — F312 Bipolar disorder, current episode manic severe with psychotic features: Secondary | ICD-10-CM | POA: Diagnosis not present

## 2024-02-27 NOTE — Plan of Care (Signed)
  Problem: Activity: Goal: Sleeping patterns will improve Outcome: Progressing   Problem: Health Behavior/Discharge Planning: Goal: Compliance with treatment plan for underlying cause of condition will improve Outcome: Progressing   Problem: Safety: Goal: Periods of time without injury will increase Outcome: Progressing   

## 2024-02-27 NOTE — Progress Notes (Signed)
(  Sleep Hours) -9.25 (Any PRNs that were needed, meds refused, or side effects to meds)-prn zyprexa  given @2050  for mild agitation. Pt stayed in wrap-up group for 15 min  (Any disturbances and when (visitation, over night)-none (Concerns raised by the patient)- none (SI/HI/AVH)- denies all
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
## 2024-02-27 NOTE — Group Note (Signed)
 Date:  02/27/2024 Time:  4:56 PM  Group Topic/Focus:  Goals Group:   The focus of this group is to help patients establish daily goals to achieve during treatment and discuss how the patient can incorporate goal setting into their daily lives to aide in recovery. Orientation:   The focus of this group is to educate the patient on the purpose and policies of crisis stabilization and provide a format to answer questions about their admission.  The group details unit policies and expectations of patients while admitted.    Participation Level:  Did Not Attend   Additional Comments:  Did not attend  Allison Bolton [name] 02/27/2024, 4:56 PM

## 2024-02-27 NOTE — Group Note (Unsigned)
 Date:  03/03/2024 Time:  3:58 PM  Group Topic/Focus:  Goals Group:   The focus of this group is to help patients establish daily goals to achieve during treatment and discuss how the patient can incorporate goal setting into their daily lives to aide in recovery. Orientation:   The focus of this group is to educate the patient on the purpose and policies of crisis stabilization and provide a format to answer questions about their admission.  The group details unit policies and expectations of patients while admitted.    Participation Level:  Did Not Attend  Additional Comments:  Did not attend.  Allison Bolton [name] 03/03/2024, 3:58 PM

## 2024-02-27 NOTE — Group Note (Signed)
 LCSW Group Therapy Note  @TD @   10:00am-11:00am  Type of Therapy and Topic:  Group Therapy: Gratitude  Participation Level:  Did Not Attend   Description of Group:   In this group, patients shared and discussed the importance of acknowledging the elements in their lives for which they are grateful and how this can positively impact their mood.  The group discussed how bringing the positive elements of their lives to the forefront of their minds can help with recovery from any illness, physical or mental.  An exercise was done as a group in which a list was made of gratitude items in order to encourage participants to consider other potential positives in their lives.  Therapeutic Goals: Patients will discuss quotes about gratitude and explore how a change of attitude can make life more joyful. Patients will identify one or more items for which they are grateful in each of 6 categories:  people, experiences, things, places, skills, and other. Patients will discuss how it is possible to seek out gratitude in even bad situations. Patients will explore how the lack of gratitude can bring them down.   Summary of Patient Progress: NA Solution-Focused Therapy Activity  Allison Bolton O Nichola Cieslinski, LCSWA 02/27/2024  12:20 PM

## 2024-02-27 NOTE — Progress Notes (Signed)
 Patient came out of room and stated I pooped myself. Feces known to be all over the floor, toilet, and clothes. Patient assisted with cleaning up and took a shower. Dirty linen disposed of in biohazard bag appropriately and EVS contacted to sanitize room.

## 2024-02-27 NOTE — Progress Notes (Addendum)
 Patient presents: Anxious and tearful this morning stating she wants to be discharged. Patient stating she needs to be with her grand babies. Patient calmer this evening and attended meals. Cooperative and med compliant for remainder of shift.   SI/HI/AVH: Denies   Plan: Denies   Groups attended:0/2   Appetite: Adequate. Attended meals.   Sleep: No sleep disturbances reported.Patient states I sleep all the time.   PRNS: hydroxyzine  po prn for anxiety.   Disturbances: No further disturbances. .    Questions/concerns: No further questions or concerns other than discharging and attaining a lawyer for her discharge.   VS: BP (!) 124/90   Pulse 66   Temp 97.8 F (36.6 C)   Resp 16   Ht 5' 6 (1.676 m)   Wt 80.7 kg   SpO2 97%   BMI 28.71 kg/m

## 2024-02-27 NOTE — Group Note (Signed)
 Date:  02/27/2024 Time:  8:42 PM  Group Topic/Focus:  Wrap-Up Group:   The focus of this group is to help patients review their daily goal of treatment and discuss progress on daily workbooks.    Participation Level:  Did Not Attend  Participation Quality:  Did Not Attend  Affect:  Did Not Attend  Cognitive:  Did Not Attend  Insight: None  Engagement in Group:  Did Not Attend  Modes of Intervention:  Did Not Attend  Additional Comments:  Pt was encouraged to attend wrap up group but did not attend.  Lonni Na 02/27/2024, 8:42 PM

## 2024-02-27 NOTE — Progress Notes (Signed)
   02/27/24 1100  Psych Admission Type (Psych Patients Only)  Admission Status Involuntary  Psychosocial Assessment  Patient Complaints Anxiety;Irritability  Eye Contact Fair  Facial Expression Anxious  Affect Sad  Speech Pressured  Interaction Assertive  Motor Activity Fidgety  Appearance/Hygiene Disheveled  Behavior Characteristics Cooperative;Calm  Mood Preoccupied  Thought Process  Coherency Circumstantial  Content Delusions  Delusions Paranoid  Perception Derealization  Hallucination None reported or observed  Judgment Poor  Confusion Mild  Danger to Self  Current suicidal ideation? Denies  Agreement Not to Harm Self Yes  Description of Agreement Verbal  Danger to Others  Danger to Others None reported or observed

## 2024-02-27 NOTE — Group Note (Signed)
 [facility] LCSW Group Therapy Note    Group Date: 02/27/2024 Start Time: 1030 End Time: 1130  Type of Therapy and Topic:  Group Therapy:  Overcoming Obstacles  Participation Level:  [facility] PARTICIPATION LEVEL: Did Not Attend   Hunter JONELLE Lever, LCSWA

## 2024-02-28 DIAGNOSIS — F1520 Other stimulant dependence, uncomplicated: Secondary | ICD-10-CM | POA: Diagnosis not present

## 2024-02-28 DIAGNOSIS — F312 Bipolar disorder, current episode manic severe with psychotic features: Secondary | ICD-10-CM | POA: Diagnosis not present

## 2024-02-28 NOTE — Group Note (Signed)
 Date:  02/28/2024 Time:  10:44 AM  Group Topic/Focus:  Goals Group:   The focus of this group is to help patients establish daily goals to achieve during treatment and discuss how the patient can incorporate goal setting into their daily lives to aide in recovery. Orientation:   The focus of this group is to educate the patient on the purpose and policies of crisis stabilization and provide a format to answer questions about their admission.  The group details unit policies and expectations of patients while admitted.    Participation Level:  Did Not Attend   Additional Comments:  Did not attend.   Yakub Lodes M Sabrine Patchen 02/28/2024, 10:44 AM

## 2024-02-28 NOTE — Progress Notes (Signed)
   02/28/24 0033  Psych Admission Type (Psych Patients Only)  Admission Status Involuntary  Psychosocial Assessment  Patient Complaints Anxiety  Eye Contact Fair  Facial Expression Anxious  Affect Sad  Speech Logical/coherent  Interaction Assertive  Motor Activity Fidgety  Appearance/Hygiene Disheveled;In hospital gown  Behavior Characteristics Cooperative;Calm  Mood Preoccupied  Thought Process  Coherency Circumstantial  Content Delusions  Delusions Paranoid  Perception Derealization  Hallucination None reported or observed  Judgment Poor  Confusion Mild  Danger to Self  Current suicidal ideation? Denies  Agreement Not to Harm Self Yes  Description of Agreement Verbal  Danger to Others  Danger to Others None reported or observed

## 2024-02-28 NOTE — Progress Notes (Signed)
   02/28/24 0810  Psych Admission Type (Psych Patients Only)  Admission Status Involuntary  Psychosocial Assessment  Patient Complaints Anxiety  Eye Contact Fair  Facial Expression Sad  Affect Sad  Speech Logical/coherent  Interaction Assertive  Motor Activity Fidgety  Appearance/Hygiene Disheveled  Behavior Characteristics Cooperative  Mood Preoccupied  Thought Process  Coherency Circumstantial  Content Delusions  Delusions Paranoid  Perception Derealization  Hallucination None reported or observed  Judgment Poor  Confusion Mild  Danger to Self  Current suicidal ideation? Denies  Agreement Not to Harm Self Yes  Description of Agreement verbal  Danger to Others  Danger to Others None reported or observed

## 2024-02-28 NOTE — [facility] Group Notes (Signed)
 [facility] Group Notes:  (Nursing/MHT/Case Management/Adjunct)  Date:  02/28/2024  Time:  9:09 PM  Type of Therapy:  Psychoeducational Skills  Participation Level:  Minimal  Participation Quality:  Inattentive  Affect:  Flat  Cognitive:  Lacking  Insight:  Lacking  Engagement in Group:  Limited  Modes of Intervention:  Education  Summary of Progress/Problems: Patient stated in group that she had a good day since she was able to rest.   Derico Mitton S 02/28/2024, 9:09 PM

## 2024-02-28 NOTE — Progress Notes (Signed)
 [facility] Inpatient Psychiatry Progress Note  Date: 02/28/24 Patient: Allison Bolton MRN: [id number]  Assessment and Plan: Allison Bolton is a 53-yr-old female with a past psychiatric history of substance-induced psychosis, bipolar I disorder, stimulant use disorder, GAD who initially presented on 02/17/24 to [facility] ED with a chief complaint of anxiety. In the ED, she was found to be acutely psychotic with tangential speech, agitation, and delusional thought content. Patient was admitted involuntarily to [facility] for further evaluation and management.  Since admission the patient has demonstrated slow and slight progress. She remains overtly delusional and this has improved little despite taking full doses of both haloperidol  and olanzapine . However, she has been on a full dose of olanzapine  ODT (to minimize cheeking) since 8/22 and may still respond to her current medications.    # Bipolar affective disorder, current episode manic with psychotic symptoms (HCC) # Stimulant use disorder, severe - Etiology of psychosis continues to be BPAD vs ongoing/persisting stimulant induced psychotic disorder. Perhaps both could be at play.  - Patient will have received a sufficient trial of olanzapine  20 mg daily by 8/29 - 9/1 window. At that point olanzapine  could be increased to 30 mg daily or patient could be started on clozapine for treatment resistant psychosis.  - Continue depkaote DR 750 mg q12 hr - Depakote  level was 89 on 8/20 - Continue haloperidol  10 mg BID - Continue olanzapine  ODT 10 mg BID  Risk Assessment - Patient continues to be gravely disabled  Discharge Planning Barriers to discharge: Active psychosis Estimated length of stay: 10-20 days Predicted Discharge location: home     Interval History: Chart reviewed. Patient has not been attending groups and has largely stayed in her room. She reported feeling depressed today due to ongoing concerns about  her various children and remaining in the hospital. She has been compliant with medication and denied any clear side effects. Staff report that her presentation is largely unchanged. She received PRNs for nausea and anxiety. She complained to staff that she was supposed to have been going to [facility] today but that her transport was cancelled.     Physical Exam MSK/Neuro - Gait not observed Mental Status Exam Appearance - disheveled Attitude - cooperative Speech - normal volume, prosody, inflection Mood - depressed Affect - restricted Thought Process - tangential to disorganized Thought Content - continuing delusional TC about her reported children SI/HI - does not endorse Perceptions - does not endorse AVH Judgement/Insight - impaired Fund of knowledge - WNL Language - No impairments      Lab Results:  Admission on 02/18/2024  Component Date Value Ref Range Status   Valproic  Acid Lvl 02/24/2024 89  50 - 100 ug/mL Final     Vitals: Blood pressure 97/73, pulse 93, temperature 97.8 F (36.6 C), resp. rate 16, height 5' 6 (1.676 m), weight 80.7 kg, SpO2 97%.    Allison DELENA Salmon, DO

## 2024-02-28 NOTE — Progress Notes (Signed)
(  Sleep Hours) - 7.75 (Any PRNs that were needed, meds refused, or side effects to meds)- PRN vistaril  25 mg, zofran  4 mg, and maalox 30 mL given at pt request; no meds refused.  (Any disturbances and when (visitation, over night)- None  (Concerns raised by the patient)- None  (SI/HI/AVH)- Denies SI/HI/AVH

## 2024-02-28 NOTE — Group Note (Unsigned)
 Date:  02/28/2024 Time:  10:41 AM  Group Topic/Focus:  Goals Group:   The focus of this group is to help patients establish daily goals to achieve during treatment and discuss how the patient can incorporate goal setting into their daily lives to aide in recovery. Orientation:   The focus of this group is to educate the patient on the purpose and policies of crisis stabilization and provide a format to answer questions about their admission.  The group details unit policies and expectations of patients while admitted.     Participation Level:  {B[facility] PARTICIPATION OZCZO:77735}  Participation Quality:  {[facility] PARTICIPATION QUALITY:22265}  Affect:  {B[facility] AFFECT:22266}  Cognitive:  {B[facility] COGNITIVE:22267}  Insight: {BHH Insight2:20797}  Engagement in Group:  {B[facility] ENGAGEMENT IN HMNLE:77731}  Modes of Intervention:  {BHH MODES OF INTERVENTION:22269}  Additional Comments:  ***  Delrae Hagey M Torrey Horseman 02/28/2024, 10:41 AM

## 2024-02-28 NOTE — Plan of Care (Signed)
   Problem: Education: Goal: Emotional status will improve Outcome: Progressing Goal: Mental status will improve Outcome: Progressing Goal: Verbalization of understanding the information provided will improve Outcome: Progressing   Problem: Activity: Goal: Interest or engagement in activities will improve Outcome: Progressing

## 2024-02-28 NOTE — [facility] Group Notes (Signed)
 [facility] Group Notes:  (Nursing/MHT/Case Management/Adjunct)  Date:  02/28/2024  Time:  8:20 PM  Type of Therapy:  Psychoeducational Skills  Participation Level:  Minimal  Participation Quality:  Attentive  Affect:  Appropriate  Cognitive:  Lacking  Insight:  Limited  Engagement in Group:  Developing/Improving  Modes of Intervention:  Education  Summary of Progress/Problems: The patient shared in group that she had a good day. She states that she slept well today and offered no additional details.   Allison Bolton [name] 02/28/2024, 8:20 PM

## 2024-02-29 ENCOUNTER — Encounter (HOSPITAL_COMMUNITY): Payer: Self-pay

## 2024-02-29 DIAGNOSIS — F312 Bipolar disorder, current episode manic severe with psychotic features: Secondary | ICD-10-CM | POA: Diagnosis not present

## 2024-02-29 MED ORDER — OLANZAPINE 15 MG PO TBDP
15.0000 mg | ORAL_TABLET | Freq: Every day | ORAL | Status: DC
  Administered 2024-02-29 – 2024-03-14 (×15): 15 mg via ORAL
  Filled 2024-02-29 (×15): qty 1

## 2024-02-29 MED ORDER — OLANZAPINE 10 MG PO TBDP
10.0000 mg | ORAL_TABLET | Freq: Every day | ORAL | Status: DC
  Administered 2024-03-01 – 2024-03-15 (×15): 10 mg via ORAL
  Filled 2024-02-29 (×15): qty 1

## 2024-02-29 NOTE — Plan of Care (Signed)
  Problem: Education: Goal: Emotional status will improve Outcome: Progressing   Problem: Activity: Goal: Interest or engagement in activities will improve Outcome: Progressing Goal: Sleeping patterns will improve Outcome: Progressing   Problem: Safety: Goal: Periods of time without injury will increase Outcome: Progressing
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
## 2024-02-29 NOTE — Progress Notes (Signed)
(  Sleep Hours) -9.5  (Any PRNs that were needed, meds refused, or side effects to meds)-   (Any disturbances and when (visitation, over night)-N/A  (Concerns raised by the patient)- N/A  (SI/HI/AVH)-denies

## 2024-02-29 NOTE — Progress Notes (Signed)
   02/28/24 2140  Psych Admission Type (Psych Patients Only)  Admission Status Involuntary  Psychosocial Assessment  Patient Complaints Anxiety;Confusion  Eye Contact Brief  Facial Expression Anxious  Affect Sad  Speech Logical/coherent  Interaction Assertive  Motor Activity Fidgety  Appearance/Hygiene Disheveled;Poor hygiene  Behavior Characteristics Cooperative  Mood Anxious;Preoccupied  Thought Process  Coherency Circumstantial  Content Delusions  Delusions Paranoid  Perception Derealization  Hallucination None reported or observed  Judgment Poor  Confusion Mild  Danger to Self  Current suicidal ideation? Denies  Agreement Not to Harm Self Yes  Description of Agreement Verbal  Danger to Others  Danger to Others None reported or observed

## 2024-02-29 NOTE — Plan of Care (Signed)
   Problem: Education: Goal: Emotional status will improve Outcome: Progressing Goal: Mental status will improve Outcome: Progressing Goal: Verbalization of understanding the information provided will improve Outcome: Progressing   Problem: Activity: Goal: Interest or engagement in activities will improve Outcome: Progressing

## 2024-02-29 NOTE — Group Note (Unsigned)
 Date:  02/29/2024 Time:  9:52 AM  Group Topic/Focus:  Goals Group:   The focus of this group is to help patients establish daily goals to achieve during treatment and discuss how the patient can incorporate goal setting into their daily lives to aide in recovery. Orientation:   The focus of this group is to educate the patient on the purpose and policies of crisis stabilization and provide a format to answer questions about their admission.  The group details unit policies and expectations of patients while admitted.     Participation Level:  {BHH PARTICIPATION OZCZO:77735}  Participation Quality:  {BHH PARTICIPATION QUALITY:22265}  Affect:  {BHH AFFECT:22266}  Cognitive:  {BHH COGNITIVE:22267}  Insight: {BHH Insight2:20797}  Engagement in Group:  {BHH ENGAGEMENT IN HMNLE:77731}  Modes of Intervention:  {BHH MODES OF INTERVENTION:22269}  Additional Comments:  ***  Adriana GORMAN Blush 02/29/2024, 9:52 AM

## 2024-02-29 NOTE — Group Note (Signed)
 Recreation Therapy Group Note   Group Topic:Coping Skills  Group Date: 02/29/2024 Start Time: 1015 End Time: 1045 Facilitators: Amery Minasyan-McCall, LRT,CTRS Location: 500 [address] Dayroom   Group Topic: Coping Skills   Goal Area(s) Addresses: Patient will define what a coping skill is. Patient will work to create a list of healthy coping skills beginning with each letter of the alphabet. Patient will successfully identify positive coping skills they can use post d/c.  Patient will acknowledge benefit(s) of using learned coping skills post d/c.   Behavioral Response:    Intervention: Group work   Activity: Coping A to Z. Patient asked to identify what a coping skill is and when they use them. Patients with Clinical research associate discussed healthy versus unhealthy coping skills. Next patients were given a blank worksheet titled Coping Skills A-Z. Patients were instructed to come up with at least one positive coping skill per letter of the alphabet, addressing a specific challenge (ex: stress, anger, anxiety, depression, grief, doubt, isolation, self-harm/suicidal thoughts, substance use). Patients were given 15 minutes to brainstorm before ideas were presented to the large group. Patients and LRT debriefed on the importance of coping skill selection based on situation and back-up plans when a skill tried is not effective. At the end of group, patients were given an handout of alphabetized strategies to keep for future reference.   Education: Pharmacologist, Scientist, physiological, Discharge Planning.    Education Outcome: Acknowledges education/Verbalizes understanding/In group clarification offered/Additional education needed   Affect/Mood: N/A   Participation Level: Did not attend    Clinical Observations/Individualized Feedback:      Plan: Continue to engage patient in RT group sessions 2-3x/week.   Cloa Bushong-McCall, LRT,CTRS  02/29/2024 1:17 PM

## 2024-02-29 NOTE — Group Note (Signed)
 LCSW Group Therapy Note   Group Date: 02/29/2024 Start Time: 1300 End Time: 1400   Participation:  did not attend  Type of Therapy:  Group Therapy  Topic:  Understanding Your Path to Change  Objective:  The goal is to help individuals understand the stages of change, identify where they currently are in the process, and provide actionable next steps to continue moving forward in their journey of change.  Goals: Learn about the six stages of change:  Precontemplation, Contemplation, Preparation, Action, Maintenance, and Relapse Reflect on Current Change Efforts:  Recognize which stage participants are in regarding a personal change. Plan Next Steps for Moving Forward:  Create an action plan based on their current stage of change.  Class Summary:  In this session, we explored the Stages of Change as a framework to understand the process of change.  We discussed how each stage helps individuals recognize where they are in their personal journey and used the Stages of Change Worksheet for self-reflection. Participants answered questions to better understand their current stage, challenges, and progress. We also emphasized the importance of moving forward, even if setbacks (Relapse) occur, and created actionable steps to help participants continue progressing. By the end of the session, participants gained a clearer understanding of their path to change and left with a clear plan for next steps.  Modalities:  Elements of CBT (cognitive restructuring, problem solving)  Element of DBT (mindfulness, distress tolerance)   Jatavia Keltner O Salimata Christenson, LCSWA 02/29/2024  6:03 PM

## 2024-02-29 NOTE — Progress Notes (Signed)
   02/29/24 0816  Psych Admission Type (Psych Patients Only)  Admission Status Involuntary  Psychosocial Assessment  Patient Complaints Anxiety;Worrying  Eye Contact Poor  Facial Expression Anxious  Affect Sad  Speech Rapid;Pressured;Ecologist Cooperative;Anxious;Fidgety  Mood Anxious;Preoccupied  Thought Process  Coherency Circumstantial  Content Delusions  Delusions Paranoid  Perception Derealization  Hallucination None reported or observed  Judgment Poor  Confusion Mild  Danger to Self  Current suicidal ideation? Denies  Agreement Not to Harm Self Yes  Description of Agreement verbal  Danger to Others  Danger to Others None reported or observed

## 2024-02-29 NOTE — Progress Notes (Signed)
(  Sleep Hours) - 9.25 (Any PRNs that were needed, meds refused, or side effects to meds)- PRN zofran  4 mg given for nausea, no meds refused.  (Any disturbances and when (visitation, over night)- None  (Concerns raised by the patient)- None  (SI/HI/AVH)- Denies SI/HI/AVH

## 2024-02-29 NOTE — Group Note (Signed)
 Date:  02/29/2024 Time:  9:59 AM  Group Topic/Focus:  Goals Group:   The focus of this group is to help patients establish daily goals to achieve during treatment and discuss how the patient can incorporate goal setting into their daily lives to aide in recovery. Orientation:   The focus of this group is to educate the patient on the purpose and policies of crisis stabilization and provide a format to answer questions about their admission.  The group details unit policies and expectations of patients while admitted.    Participation Level:  Did Not Attend  Additional Comments:  Pt chose not to attend group.   Adriana GORMAN Blush 02/29/2024, 9:59 AM
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
## 2024-03-01 DIAGNOSIS — F312 Bipolar disorder, current episode manic severe with psychotic features: Secondary | ICD-10-CM | POA: Diagnosis not present

## 2024-03-01 MED ORDER — HALOPERIDOL 5 MG PO TABS
ORAL_TABLET | ORAL | Status: AC
  Filled 2024-03-01: qty 1

## 2024-03-01 NOTE — [facility] Suicide Risk Assessment (Signed)
 [facility] INPATIENT:  Family/Significant Other Suicide Prevention Education  Suicide Prevention Education:  Contact Attempts: Oneil Cromer (brother) [phone], (name of family member/significant other) has been identified by the patient as the family member/significant other with whom the patient will be residing, and identified as the person(s) who will aid the patient in the event of a mental health crisis.  r.  Date and time of first attempt: 03/01/2024 / 5:25 PM  CSW left a voicemail.   [name] [name], LCSWA 03/01/2024, 5:24 PM
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
## 2024-03-01 NOTE — Group Note (Signed)
 Recreation Therapy Group Note   Group Topic:Self-Esteem  Group Date: 03/01/2024 Start Time: 1040 End Time: 1115 Facilitators: Bucky Grigg-McCall, LRT,CTRS Location: [address] Dayroom   Group Topic/Focus: Self Expression   Goal Area(s) Addresses:  Patient will identify positive things about themselves. Patient will identify the importance of being self confidence.     Behavioral Response:   Intervention: Drawing  Activity : LRT discussed with patients the importance of self expression. Patients were then given a picture of a blank face, and told to illustrate and describe how they see and feel about themselves. Patients were given colored pencils, markers, and crayons to complete the assignment. Patients shared their completed assignment with each other.   Education: Self Expression, Discharge Planning  Educational Outcome: Acknowledges education   Affect/Mood: N/A   Participation Level: Did not attend    Clinical Observations/Individualized Feedback:     Plan: Continue to engage patient in RT group sessions 2-3x/week.   Allison Bolton, LRT,CTRS 03/01/2024 1:26 PM

## 2024-03-01 NOTE — [facility] Group Notes (Signed)
 [facility] Group Notes:  (Nursing/MHT/Case Management/Adjunct)  Date:  03/01/2024  Time:  8:50 PM  Type of Therapy:  Psychoeducational Skills  Participation Level:  Minimal  Participation Quality:  Attentive  Affect:  Appropriate  Cognitive:  Appropriate  Insight:  Limited  Engagement in Group:  Lacking  Modes of Intervention:  Education  Summary of Progress/Problems: The patient verbalized that she had a good day overall since she slept. She states that she didn't learn anything today.   Zymere Patlan S 03/01/2024, 8:50 PM

## 2024-03-01 NOTE — [facility] Suicide Risk Assessment (Addendum)
 [facility] INPATIENT:  Family/Significant Other Suicide Prevention Education  Suicide Prevention Education:  Contact Attempts: [place] (relationship to patient unknown at this time) [phone], (name of family member/significant other) has been identified by the patient as the family member/significant other with whom the patient will be residing, and identified as the person(s) who will aid the patient in the event of a mental health crisis.    Date and time of first attempt: 03/01/2024 / 5:22 PM  CSW left a voicemail.   China Deitrick O Jacere Pangborn 03/01/2024, 5:21 PM

## 2024-03-01 NOTE — Progress Notes (Signed)
 Allison Bolton   Type of Note: IVC COURT HEARING  Patient, this Clinical research associate, and Jolanta McKinnie CSW were present for virtual IVC court hearing this afternoon. [name] granted [facility] additional 7 days of IVC.  Signed:  Ashleigh Arya, LCSW-A 03/01/2024  2:14 PM
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
## 2024-03-01 NOTE — [facility] Group Notes (Signed)
 Adult Psychoeducational Group Note  Date:  03/01/2024 Time:  4:27 AM  Group Topic/Focus:  Wrap-Up Group:   The focus of this group is to help patients review their daily goal of treatment and discuss progress on daily workbooks.  Participation Level:   Participation Quality:    Affect:   Cognitive:    Insight:   Engagement in Group:    Modes of Intervention:    Additional Comments:  Pt did not attend group.  Drue Pouch 03/01/2024, 4:27 AM

## 2024-03-01 NOTE — Progress Notes (Signed)
   03/01/24 2200  Psychosocial Assessment  Patient Complaints Worrying  Eye Contact Poor  Facial Expression Anxious;Worried  Affect Preoccupied  Astronomer Activity Restless  Appearance/Hygiene Body odor;Poor hygiene;Disheveled  Behavior Characteristics Cooperative  Mood Anxious;Preoccupied  Thought Process  Coherency Circumstantial;Tangential  Content Preoccupation;Delusions  Delusions Paranoid  Perception Derealization  Hallucination None reported or observed  Judgment Impaired  Confusion None  Danger to Self  Current suicidal ideation? Denies  Agreement Not to Harm Self Yes  Description of Agreement Verbal  Danger to Others  Danger to Others None reported or observed

## 2024-03-01 NOTE — B[facility] Suicide Risk Assessment (Addendum)
 [facility] INPATIENT:  Family/Significant Other Suicide Prevention Education  Suicide Prevention Education:  Contact Attempts: Larnell Carota (uncle) [phone], (name of family member/significant other) has been identified by the patient as the family member/significant other with whom the patient will be residing, and identified as the person(s) who will aid the patient in the event of a mental health crisis.    Date and time of first attempt: 03/01/2024 / 5:19 PM  CSW left a voicemail.   Nikaela Coyne O Emran Molzahn, LCSWA 03/01/2024, 5:18 PM

## 2024-03-01 NOTE — B[facility] Group Notes (Signed)
 Adult Psychoeducational Group Note  Date:  03/01/2024 Time:  9:51 AM  Group Topic/Focus:  Goals Group:   The focus of this group is to help patients establish daily goals to achieve during treatment and discuss how the patient can incorporate goal setting into their daily lives to aide in recovery. Orientation:   The focus of this group is to educate the patient on the purpose and policies of crisis stabilization and provide a format to answer questions about their admission.  The group details unit policies and expectations of patients while admitted.  Participation Level:  Did Not Attend   Additional Comments:  Pt was invited but did not attend orientation / goals group.  Niel CHRISTELLA Nightingale 03/01/2024, 9:51 AM

## 2024-03-02 DIAGNOSIS — F312 Bipolar disorder, current episode manic severe with psychotic features: Secondary | ICD-10-CM | POA: Diagnosis not present
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
## 2024-03-02 NOTE — Progress Notes (Signed)
(  Sleep Hours) - 8.25 (Any PRNs that were needed, meds refused, or side effects to meds)- N/A (Any disturbances and when (visitation, over night)- N/A (Concerns raised by the patient)- N/A (SI/HI/AVH)- DENIES

## 2024-03-02 NOTE — B[facility] Group Notes (Signed)
 The focus of this group is to help patients review their daily goal of treatment and discuss progress on daily workbooks.    Scale 1-10  10   What was your goal for today and did you accomplish? How?  To get rest and I accomplished that

## 2024-03-02 NOTE — Progress Notes (Signed)
   03/02/24 2100  Psych Admission Type (Psych Patients Only)  Admission Status Voluntary  Psychosocial Assessment  Patient Complaints Anxiety;Worrying  Eye Contact Poor;Suspiciousness  Facial Expression Anxious;Worried  Affect Preoccupied  Astronomer Activity Restless  Appearance/Hygiene Disheveled;Poor hygiene  Behavior Characteristics Cooperative  Mood Pleasant;Preoccupied  Thought Process  Coherency Circumstantial;Tangential  Content Preoccupation;Delusions  Delusions Paranoid  Perception Derealization  Hallucination None reported or observed  Judgment Impaired  Confusion None  Danger to Self  Current suicidal ideation? Denies  Agreement Not to Harm Self Yes  Description of Agreement Verbal  Danger to Others  Danger to Others None reported or observed

## 2024-03-02 NOTE — Plan of Care (Signed)
  Problem: Education: Goal: Emotional status will improve Outcome: Progressing   Problem: Activity: Goal: Interest or engagement in activities will improve Outcome: Progressing   Problem: Education: Goal: Mental status will improve Outcome: Not Progressing   

## 2024-03-02 NOTE — Group Note (Signed)
 Recreation Therapy Group Note   Group Topic:Health and Wellness  Group Date: 03/02/2024 Start Time: 1015 End Time: 1040 Facilitators: Eniyah Eastmond-McCall, LRT,CTRS Location: [address] Dayroom   Group Topic: Exercise/Wellness  Goal Area(s) Addresses:  Patient will actively participate in selected exercises.  Patient will verbalize benefit of exercise during group session. Patient will acknowledge benefits of exercise when used as a coping mechanism.   Behavioral Response:   Intervention: Music  Activity: Exercise. LRT and patients discussed the importance of physical exercise. Patients took turns leading the group in the exercises and stretches of their choosing. Patients were encouraged to do their best but not strain themselves. Patients were encouraged to take breaks or get water  as needed.  Education: Physical Activity, Health and Wellness  Education Outcome: Acknowledges understanding/In group clarification offered/Needs additional education.    Affect/Mood: N/A   Participation Level: Did not attend    Clinical Observations/Individualized Feedback:      Plan: Continue to engage patient in RT group sessions 2-3x/week.   Rafeal Skibicki-McCall, LRT,CTRS  03/02/2024 12:55 PM

## 2024-03-02 NOTE — [facility] Suicide Risk Assessment (Signed)
 [facility] INPATIENT:  Family/Significant Other Suicide Prevention Education  Suicide Prevention Education:  Education Completed; Winton Sheller (aunt) [phone],  (name of family member/significant other) has been identified by the patient as the family member/significant other with whom the patient will be residing, and identified as the person(s) who will aid the patient in the event of a mental health crisis (suicidal ideations/suicide attempt).  With written consent from the patient, the family member/significant other has been provided the following suicide prevention education, prior to the and/or following the discharge of the patient.  Aunt said that patient can return to her home upon discharge.  She said she can pick up patient.  Aunt would like to visit patient before she is discharged (she doesn't have the code yet).  Aunt said they don't have guns or weapons, and patient doesn't have access to guns or weapons.  She doesn't have any safety concerns about patient being discharged.  The suicide prevention education provided includes the following: Suicide risk factors Suicide prevention and interventions National Suicide Hotline telephone number [facility] assessment telephone number [facility] Emergency Assistance 911 [facility] Inpatient and/or Residential Mobile Crisis Unit telephone number  Request made of family/significant other to: Remove weapons (e.g., guns, rifles, knives), all items previously/currently identified as safety concern.   Remove drugs/medications (over-the-counter, prescriptions, illicit drugs), all items previously/currently identified as a safety concern.  The family member/significant other verbalizes understanding of the suicide prevention education information provided.  The family member/significant other agrees to remove the items of safety concern listed above.  Keonta Alsip O Jozlyn Schatz, LCSWA 03/02/2024, 3:20 PM

## 2024-03-03 DIAGNOSIS — F312 Bipolar disorder, current episode manic severe with psychotic features: Secondary | ICD-10-CM | POA: Diagnosis not present

## 2024-03-03 NOTE — Progress Notes (Signed)
(  Sleep Hours) - 7  (Any PRNs that were needed, meds refused, or side effects to meds)- zofran , no meds refused, no side effects to meds  (Any disturbances and when (visitation, over night)- N/A (Concerns raised by the patient)- N/A (SI/HI/AVH)- DENIES

## 2024-03-03 NOTE — Progress Notes (Signed)
 Conversation with patient:  CSW asked patient if she would be interested in an inpatient treatment program (at admission, patient tested positive for amphetamines).  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   Neythan Kozlov, LCSWA 03/02/2024

## 2024-03-03 NOTE — Plan of Care (Signed)
   Problem: Education: Goal: Emotional status will improve Outcome: Progressing Goal: Mental status will improve Outcome: Progressing Goal: Verbalization of understanding the information provided will improve Outcome: Progressing

## 2024-03-03 NOTE — Progress Notes (Signed)
   03/03/24 2100  Psych Admission Type (Psych Patients Only)  Admission Status Voluntary  Psychosocial Assessment  Patient Complaints Anxiety;Confusion  Eye Contact Suspiciousness;Poor  Facial Expression Anxious  Affect Preoccupied  Speech Slow  Interaction Assertive  Motor Activity Fidgety  Appearance/Hygiene Disheveled;Poor hygiene  Behavior Characteristics Cooperative  Mood Anxious;Preoccupied  Thought Process  Coherency Disorganized;Tangential  Content Preoccupation;Delusions  Delusions Paranoid  Perception Derealization  Hallucination None reported or observed  Judgment Impaired  Confusion None  Danger to Self  Current suicidal ideation? Denies  Agreement Not to Harm Self Yes  Description of Agreement Verbal  Danger to Others  Danger to Others None reported or observed

## 2024-03-03 NOTE — Group Note (Signed)
 Date:  03/03/2024 Time:  3:42 PM  Group Topic/Focus:  Goals Group:   The focus of this group is to help patients establish daily goals to achieve during treatment and discuss how the patient can incorporate goal setting into their daily lives to aide in recovery. Orientation:   The focus of this group is to educate the patient on the purpose and policies of crisis stabilization and provide a format to answer questions about their admission.  The group details unit policies and expectations of patients while admitted.    Participation Level:  Did Not Attend    Additional Comments:  Did not attend  Logan LITTIE [name] 03/03/2024, 3:42 PM

## 2024-03-03 NOTE — Progress Notes (Addendum)
 Collateral contact - Oneil Cromer (brother) [phone]   Brother said that he lives in [place]  and is looking after his 54 year old aunt, so can't help patient at this time.  CSW offered information re: crisis resources in [place], but he declined at this time.    Brother said that patient has been struggling with mental health her whole life.  Patient bounces from state to state, and 4 months ago she was in [place] .   Sinclaire Artiga, LCSWA 03/03/2024

## 2024-03-03 NOTE — Progress Notes (Signed)
 Allison Bolton - daughter wants to be called for collateral  [phone]

## 2024-03-03 NOTE — [facility] Group Notes (Signed)
 Adult Psychoeducational Group Note  Date:  03/03/2024 Time:  8:26 PM  Group Topic/Focus:  Wrap-Up Group:   The focus of this group is to help patients review their daily goal of treatment and discuss progress on daily workbooks.  Participation Level:  Active  Participation Quality:  Appropriate  Affect:  Appropriate  Cognitive:  Appropriate  Insight: Appropriate  Engagement in Group:  Engaged  Modes of Intervention:  Discussion  Additional Comments:  Pt attended group.  Drue Pouch 03/03/2024, 8:26 PM

## 2024-03-03 NOTE — Progress Notes (Signed)
   03/03/24 0900  Psych Admission Type (Psych Patients Only)  Admission Status Voluntary  Psychosocial Assessment  Patient Complaints Anxiety;Worrying;Confusion;Crying spells  Eye Contact Poor;Suspiciousness;Intense  Facial Expression Anxious  Affect Preoccupied;Anxious  Speech Slow  Interaction Assertive  Motor Activity Fidgety  Appearance/Hygiene Poor hygiene;Disheveled  Behavior Characteristics Cooperative  Mood Preoccupied;Anxious;Sad  Thought Process  Coherency Tangential;Disorganized  Content Preoccupation;Delusions  Delusions Paranoid;Somatic  Perception Derealization  Hallucination None reported or observed  Judgment Poor  Confusion None  Danger to Self  Current suicidal ideation? Denies  Agreement Not to Harm Self Yes  Description of Agreement verbal  Danger to Others  Danger to Others None reported or observed

## 2024-03-03 NOTE — Group Note (Signed)
 Recreation Therapy Group Note   Group Topic:Healthy Decision Making  Group Date: 03/03/2024 Start Time: 1005 End Time: 1025 Facilitators: Rajat Staver-McCall, LRT,CTRS Location: [address] Dayroom   Group Topic: Decision Making, Problem Solving, Communication  Goal Area(s) Addresses:  Patient will effectively work with peer towards shared goal.  Patient will identify factors that guided their decision making.  Patient will pro-socially communicate ideas during group session.   Behavioral Response:   Intervention: Survival Scenario - pencil, paper  Activity: Patients were given a scenario that they were going to be stranded on a deserted [place] for several months before being rescued. Writer tasked them with making a list of 15 things they would choose to bring with them for survival. The list of items was prioritized most important to least. Each patient would come up with their own list, then work together to create a new list of 15 items while in a group of 3-5 peers. LRT discussed each person's list and how it differed from others. The debrief included discussion of priorities, good decisions versus bad decisions, and how it is important to think before acting so we can make the best decision possible. LRT tied the concept of effective communication among group members to patient's support systems outside of the hospital and its benefit post discharge.  Education: Pharmacist, community, Priorities, Support System, Discharge Planning   Education Outcome: Acknowledges education/In group clarification/Needs additional education   Affect/Mood: N/A   Participation Level: Did not attend    Clinical Observations/Individualized Feedback:      Plan: Continue to engage patient in RT group sessions 2-3x/week.   Allison Bolton, LRT,CTRS 03/03/2024 12:25 PM

## 2024-03-03 NOTE — Progress Notes (Signed)
 [facility] MD Progress Note  03/03/2024 9:42 AM Allison Bolton  MRN:  [id number]  Principal Problem: Bipolar affective disorder, current episode manic with psychotic symptoms (HCC) Diagnosis: Principal Problem:   Bipolar affective disorder, current episode manic with psychotic symptoms (HCC)   Reason for Admission:  Allison Bolton is a 53-yr-old female with a past psychiatric history of substance-induced psychosis, bipolar I disorder, stimulant use disorder, GAD who presented on 02/17/24 to [facility] ED with a chief complaint of anxiety. In the ED, she was found to be acutely psychotic with tangential speech, agitation, and delusional thought content. Patient was admitted involuntarily to [facility] for further evaluation and management (admitted on 02/18/2024, total  LOS: 14 days )   ASSESSMENT:  Patient has had very limited progress in symptoms since admission despite being on full trials of haloperidol , olanzapine , and Depakote . She remains disorganized, tangential, delusional and emotionally labile. Given limited symptom improvement, we have been considering alternative treatment options including ECT and Clozapine. Patient has a history of medication non-adherence and is not likely to be an ideal candidate for Clozapine. Therefore, we will likely pursue ECT at this time and look into transferring patient to an outside hospital system with ECT capabilities. We have reached out to collaborate with coordinators to begin facilitation of this transfer.   PLAN: # Substance-induced psychosis # Bipolar disorder -- Continue haloperidol  10 mg bid -- Continue olanzapine  10 mg qAM + 15 mg qhs -- Continue ativan  1 mg bid  -- Continue Depakote  DR 750 mg q12h  --VPA level on 8/20 was 89 - Trazodone  50 mg at bedtime for insomnia -- Discontinued prolixin   Disposition Planning: --Estimated LOS: 21 days --Estimated Discharge Date: TBD --Barriers to Discharge: psychosis --Discharge Goals: Transfer  patient to outside hospital system for ECT treatment  Subjective: The patient was seen and evaluated on the unit. On assessment today, patient is seen lying in bed. Her hair is disheveled and hygiene is poor. Reports she is eating and sleeping well. States all she does is eat and sleep. She denies SI/HI/AVH. Patient initially cooperative and responding to questions without endorsing delusions. When prompted about her grandchildren, patient begins repeating same delusions about her grandchildren needing help, needing to go to [place]  to save them. She becomes tearful. She then starts talking about several family members including her Wylie Gong. She is informed staff was able to get in touch with her Aunt Peggy yesterday.   Objective: Chart Review from last 24 hours:  The patient's chart was reviewed and nursing notes were reviewed. The patient's case was discussed in multidisciplinary team meeting.  - Overnight events to report per chart review / staff report: none - Patient took all prescribed medications: yes - Patient received the following PRNs: Zofran   Current Medications: Current Facility-Administered Medications  Medication Dose Route Frequency Provider Last Rate Last Admin   acetaminophen  (TYLENOL ) tablet 650 mg  650 mg Oral Q6H PRN White, Patrice L, NP   650 mg at 03/01/24 1802   alum & mag hydroxide-simeth (MAALOX/MYLANTA) 200-200-20 MG/5ML suspension 30 mL  30 mL Oral Q4H PRN White, Patrice L, NP   30 mL at 02/27/24 2357   benztropine  (COGENTIN ) tablet 1 mg  1 mg Oral BID White, Patrice L, NP   1 mg at 03/03/24 0840   divalproex  (DEPAKOTE ) DR tablet 750 mg  750 mg Oral Q12H Ji, Andrew, MD   750 mg at 03/03/24 0840   haloperidol  (HALDOL ) tablet 10 mg  10 mg Oral Q12H  Ji, Andrew, MD   10 mg at 03/03/24 0840   hydrOXYzine  (ATARAX ) tablet 25 mg  25 mg Oral TID PRN White, Patrice L, NP   25 mg at 03/02/24 1627   LORazepam  (ATIVAN ) tablet 1 mg  1 mg Oral Q12H Lynnette Barter, MD   1 mg at  03/03/24 0840   magnesium  hydroxide (MILK OF MAGNESIA) suspension 30 mL  30 mL Oral Daily PRN White, Patrice L, NP       nicotine  polacrilex (NICORETTE ) gum 2 mg  2 mg Oral PRN Trudy Carwin, NP   2 mg at 02/23/24 1823   OLANZapine  (ZYPREXA ) injection 10 mg  10 mg Intramuscular TID PRN White, Patrice L, NP       OLANZapine  (ZYPREXA ) injection 5 mg  5 mg Intramuscular TID PRN White, Patrice L, NP       OLANZapine  zydis (ZYPREXA ) disintegrating tablet 10 mg  10 mg Oral Daily Theron Cumbie N, MD   10 mg at 03/03/24 0840   OLANZapine  zydis (ZYPREXA ) disintegrating tablet 15 mg  15 mg Oral QHS Tamico Mundo N, MD   15 mg at 03/02/24 2052   OLANZapine  zydis (ZYPREXA ) disintegrating tablet 5 mg  5 mg Oral TID PRN Teresa Jes L, NP   5 mg at 02/26/24 2050   ondansetron  (ZOFRAN -ODT) disintegrating tablet 4 mg  4 mg Oral Q8H PRN Lynnette Barter, MD   4 mg at 03/03/24 9383   traZODone  (DESYREL ) tablet 50 mg  50 mg Oral QHS White, Patrice L, NP   50 mg at 03/02/24 2051    Lab Results:  No results found for this or any previous visit (from the past 48 hours).   Blood Alcohol level:  Lab Results  Component Value Date   [facility] <15 02/17/2024   ETH <15 01/14/2024    Metabolic Labs: Lab Results  Component Value Date   HGBA1C 5.1 01/18/2024   MPG 99.67 01/18/2024   MPG 105.41 05/11/2019   Lab Results  Component Value Date   PROLACTIN 24.1 (H) 05/11/2019   Lab Results  Component Value Date   CHOL 154 01/18/2024   TRIG 180 (H) 01/18/2024   HDL 49 01/18/2024   CHOLHDL 3.1 01/18/2024   VLDL 36 01/18/2024   LDLCALC 69 01/18/2024   LDLCALC 82 05/11/2019    Physical Findings: AIMS: No  CIWA:    COWS:     Psychiatric Specialty Exam: General Appearance: Disheveled   Eye Contact: Minimal   Speech: Pressured   Volume: Increased   Mood: Labile   Affect: Labile   Thought Content: Illogical; Tangential   Suicidal Thoughts: Denies      Homicidal Thoughts: Denies      Thought  Process: Disorganized   Orientation: Partial     Memory: Immediate Poor; Recent Poor   Judgment: Poor   Insight: Poor   Concentration: Poor   Recall: Poor   Fund of Knowledge: Fair   Language: Fair   Psychomotor Activity: Psychomotor Activity: Normal       Assets: Physical Health   Sleep: Sleep: Good        Review of Systems Review of Systems  Gastrointestinal:  Negative for abdominal pain, constipation, diarrhea, nausea and vomiting.  Neurological:  Negative for dizziness and headaches.    Vital Signs: Blood pressure 104/83, pulse 82, temperature (!) 97.5 F (36.4 C), temperature source Oral, resp. rate 16, height 5' 6 (1.676 m), weight 80.7 kg, SpO2 96%. Body mass index is 28.71 kg/m. Physical Exam  Vitals and nursing note reviewed.  Constitutional:      General: She is not in acute distress.    Appearance: Normal appearance. She is normal weight. She is not ill-appearing.  Pulmonary:     Effort: Pulmonary effort is normal.  Neurological:     General: No focal deficit present.     Mental Status: She is alert. She is disoriented.     Signed: Ashley LOISE Gravely, MD, PGY-1 03/03/2024, 9:42 AM

## 2024-03-03 NOTE — Progress Notes (Addendum)
 Collateral contact - re:  Electroconvulsive therapy (ECT)  Patient signed the ROI  [facility] Electroconvulsive therapy (ECT) [address] [place], [place], [place] [address] Main number:  [phone] ECT line:  [phone], x 2  CSW left a voicemail.  CSW received a voicemail from a nurse from [facility] stating that CSW would need to call [facility] Transfer Referral Line [phone].   [facility] [address] [place], [place] [address] Phone:  [phone]   CSW faxed the referral completed by Dr. Raliegh.  Transfer Center.  Fax: [phone]  CSW confirmed that the fax was received, and will be forwarded to the team.  Phone:  [phone] x 2  CSW received a phone call from [facility].  Patient was accepted, however they don't have a bed available.  The receptionist advised CSW to call back tomorrow to check again.  She couldn't provide a timeframe at this moment.   Annalycia Done, LCSWA 03/03/2024

## 2024-03-04 ENCOUNTER — Encounter (HOSPITAL_COMMUNITY): Payer: Self-pay

## 2024-03-04 DIAGNOSIS — F312 Bipolar disorder, current episode manic severe with psychotic features: Secondary | ICD-10-CM | POA: Diagnosis not present

## 2024-03-04 NOTE — Progress Notes (Signed)
   03/04/24 2100  Psych Admission Type (Psych Patients Only)  Admission Status Voluntary  Psychosocial Assessment  Patient Complaints Anxiety  Eye Contact Poor  Facial Expression Anxious  Affect Anxious  Speech Soft;Slow  Interaction Minimal  Motor Activity Restless  Appearance/Hygiene Disheveled;Poor hygiene;In hospital gown  Behavior Characteristics Cooperative  Mood Anxious  Thought Process  Coherency Disorganized;Tangential  Content Preoccupation  Delusions Paranoid  Perception Derealization  Hallucination None reported or observed  Judgment Poor  Confusion None  Danger to Self  Current suicidal ideation? Denies  Danger to Others  Danger to Others None reported or observed

## 2024-03-04 NOTE — Progress Notes (Signed)
 [facility] MD Progress Note  03/04/2024 10:51 AM Allison Bolton  MRN:  [id number]  Principal Problem: Bipolar affective disorder, current episode manic with psychotic symptoms (HCC) Diagnosis: Principal Problem:   Bipolar affective disorder, current episode manic with psychotic symptoms (HCC)   Reason for Admission:  Allison Bolton is a 53-yr-old female with a past psychiatric history of substance-induced psychosis, bipolar I disorder, stimulant use disorder, GAD who presented on 02/17/24 to [facility] ED with a chief complaint of anxiety. In the ED, she was found to be acutely psychotic with tangential speech, agitation, and delusional thought content. Patient was admitted involuntarily to [facility] for further evaluation and management (admitted on 02/18/2024, total  LOS: 15 days )   ASSESSMENT:  Patient has had very limited progress in symptoms since admission despite being on full trials of haloperidol , olanzapine , and Depakote . As of 8/29, she remains disorganized, tangential, delusional and emotionally labile. We are pursuing ECT as an alternative treatment option given patient's non-responsiveness to antipsychotics. We have been in contact with coordinators and social work to facilitate transfer to an outside hospital system for ECT. Patient was accepted at [facility], but they do not have a bed currently available. We will continue to reach out to [facility] regarding updates on bed availability.   PLAN: # Substance-induced psychosis # Bipolar disorder -- Continue haloperidol  10 mg bid -- Continue olanzapine  10 mg qAM + 15 mg qhs -- Continue ativan  1 mg bid  -- Continue Depakote  DR 750 mg q12h  --VPA level on 8/20 was 89 - Trazodone  50 mg at bedtime for insomnia -- Discontinued prolixin   Disposition Planning: --Estimated LOS: 21 days --Estimated Discharge Date: TBD --Barriers to Discharge: psychosis --Discharge Goals: Transfer patient to outside hospital system for ECT  treatment  Subjective: The patient was seen and evaluated on the unit. On assessment today, patient is seen lying in bed sleeping. She is very somnolent. Reports she is eating and sleeping well. She denies SI/HI/AVH.   Objective: Chart Review from last 24 hours:  The patient's chart was reviewed and nursing notes were reviewed. The patient's case was discussed in multidisciplinary team meeting.  - Overnight events to report per chart review / staff report: none - Patient took all prescribed medications: Yes - Patient received the following PRNs: None  Current Medications: Current Facility-Administered Medications  Medication Dose Route Frequency Provider Last Rate Last Admin   acetaminophen  (TYLENOL ) tablet 650 mg  650 mg Oral Q6H PRN White, Patrice L, NP   650 mg at 03/01/24 1802   alum & mag hydroxide-simeth (MAALOX/MYLANTA) 200-200-20 MG/5ML suspension 30 mL  30 mL Oral Q4H PRN White, Patrice L, NP   30 mL at 02/27/24 2357   benztropine  (COGENTIN ) tablet 1 mg  1 mg Oral BID White, Patrice L, NP   1 mg at 03/04/24 0759   divalproex  (DEPAKOTE ) DR tablet 750 mg  750 mg Oral Q12H Ji, Andrew, MD   750 mg at 03/04/24 0759   haloperidol  (HALDOL ) tablet 10 mg  10 mg Oral Q12H Ji, Andrew, MD   10 mg at 03/04/24 9240   hydrOXYzine  (ATARAX ) tablet 25 mg  25 mg Oral TID PRN White, Patrice L, NP   25 mg at 03/02/24 1627   LORazepam  (ATIVAN ) tablet 1 mg  1 mg Oral Q12H Lynnette Barter, MD   1 mg at 03/04/24 0801   magnesium  hydroxide (MILK OF MAGNESIA) suspension 30 mL  30 mL Oral Daily PRN Teresa Wyline CROME, NP  nicotine  polacrilex (NICORETTE ) gum 2 mg  2 mg Oral PRN Trudy Carwin, NP   2 mg at 02/23/24 1823   OLANZapine  (ZYPREXA ) injection 10 mg  10 mg Intramuscular TID PRN White, Patrice L, NP       OLANZapine  (ZYPREXA ) injection 5 mg  5 mg Intramuscular TID PRN White, Patrice L, NP       OLANZapine  zydis (ZYPREXA ) disintegrating tablet 10 mg  10 mg Oral Daily Shelbi Vaccaro N, MD   10 mg at  03/04/24 0801   OLANZapine  zydis (ZYPREXA ) disintegrating tablet 15 mg  15 mg Oral QHS Dewey Viens N, MD   15 mg at 03/03/24 2028   OLANZapine  zydis (ZYPREXA ) disintegrating tablet 5 mg  5 mg Oral TID PRN Teresa Jes L, NP   5 mg at 02/26/24 2050   ondansetron  (ZOFRAN -ODT) disintegrating tablet 4 mg  4 mg Oral Q8H PRN Lynnette Barter, MD   4 mg at 03/03/24 9383   traZODone  (DESYREL ) tablet 50 mg  50 mg Oral QHS White, Patrice L, NP   50 mg at 03/03/24 2028    Lab Results:  No results found for this or any previous visit (from the past 48 hours).   Blood Alcohol level:  Lab Results  Component Value Date   [facility] <15 02/17/2024   ETH <15 01/14/2024    Metabolic Labs: Lab Results  Component Value Date   HGBA1C 5.1 01/18/2024   MPG 99.67 01/18/2024   MPG 105.41 05/11/2019   Lab Results  Component Value Date   PROLACTIN 24.1 (H) 05/11/2019   Lab Results  Component Value Date   CHOL 154 01/18/2024   TRIG 180 (H) 01/18/2024   HDL 49 01/18/2024   CHOLHDL 3.1 01/18/2024   VLDL 36 01/18/2024   LDLCALC 69 01/18/2024   LDLCALC 82 05/11/2019    Physical Findings: AIMS: No  CIWA:    COWS:     Psychiatric Specialty Exam: General Appearance: Disheveled   Eye Contact: Minimal   Speech: Pressured   Volume: Increased   Mood: Labile   Affect: Labile   Thought Content: Illogical; Tangential   Suicidal Thoughts: Denies      Homicidal Thoughts: Denies      Thought Process: Disorganized   Orientation: Partial     Memory: Immediate Poor; Recent Poor   Judgment: Poor   Insight: Poor   Concentration: Poor   Recall: Poor   Fund of Knowledge: Fair   Language: Fair   Psychomotor Activity: Psychomotor Activity: Normal       Assets: Physical Health   Sleep: Good        Review of Systems Review of Systems  Gastrointestinal:  Negative for abdominal pain, constipation, diarrhea, nausea and vomiting.  Neurological:  Negative for dizziness and  headaches.    Vital Signs: Blood pressure 103/73, pulse 84, temperature (!) 97.5 F (36.4 C), temperature source Oral, resp. rate 16, height 5' 6 (1.676 m), weight 80.7 kg, SpO2 99%. Body mass index is 28.71 kg/m. Physical Exam Vitals and nursing note reviewed.  Constitutional:      General: She is not in acute distress.    Appearance: Normal appearance. She is normal weight. She is not ill-appearing.  Pulmonary:     Effort: Pulmonary effort is normal.  Neurological:     General: No focal deficit present.     Mental Status: She is alert. She is disoriented.     Signed: Ashley LOISE Gravely, MD, PGY-1 03/04/2024, 10:51 AM

## 2024-03-04 NOTE — Plan of Care (Signed)
   Problem: Activity: Goal: Interest or engagement in activities will improve Outcome: Progressing

## 2024-03-04 NOTE — Progress Notes (Signed)
   03/04/24 9077  Psych Admission Type (Psych Patients Only)  Admission Status Voluntary  Psychosocial Assessment  Patient Complaints Anger;Anxiety;Confusion  Eye Contact Poor;Suspiciousness  Facial Expression Anxious;Angry  Affect Anxious;Angry;Preoccupied  Speech Slow  Interaction Assertive  Motor Activity Fidgety  Appearance/Hygiene Disheveled;Poor hygiene  Behavior Characteristics Cooperative;Irritable  Mood Anxious;Irritable;Preoccupied  Thought Process  Coherency Disorganized;Tangential  Content Delusions;Preoccupation  Delusions Paranoid  Perception Derealization  Hallucination None reported or observed  Judgment Impaired  Confusion None  Danger to Self  Current suicidal ideation? Denies  Agreement Not to Harm Self Yes  Description of Agreement Verbal  Danger to Others  Danger to Others None reported or observed

## 2024-03-04 NOTE — Plan of Care (Signed)
  Problem: Activity: Goal: Sleeping patterns will improve Outcome: Progressing   Problem: Coping: Goal: Ability to demonstrate self-control will improve Outcome: Progressing   Problem: Safety: Goal: Periods of time without injury will increase Outcome: Progressing   

## 2024-03-04 NOTE — Care Management Important Message (Signed)
 Medicare IM printed and given to social work to give to the patient. ?

## 2024-03-04 NOTE — Group Note (Signed)
 Recreation Therapy Group Note   Group Topic:Problem Solving  Group Date: 03/04/2024 Start Time: 1015 End Time: 1035 Facilitators: Lennette Fader-McCall, LRT,CTRS Location: [address] Dayroom   Group Topic: Communication, Team Building, Problem Solving  Goal Area(s) Addresses:  Patient will effectively work with peer towards shared goal.  Patient will identify skills used to make activity successful.  Patient will identify how skills used during activity can be used to reach post d/c goals.   Behavioral Response:   Intervention: STEM Activity  Activity: Straw Bridge. In teams of 3-5, patients were given 15 plastic drinking straws and an equal length of masking tape. Using the materials provided, patients were instructed to build a free standing bridge-like structure to suspend an everyday item (ex: puzzle box) off of the floor or table surface. All materials were required to be used by the team in their design. LRT facilitated post-activity discussion reviewing team process. Patients were encouraged to reflect how the skills used in this activity can be generalized to daily life post discharge.   Education: Pharmacist, community, Scientist, physiological, Discharge Planning   Education Outcome: Acknowledges education/In group clarification offered/Needs additional education.    Affect/Mood: N/A   Participation Level: Did not attend    Clinical Observations/Individualized Feedback:      Plan: Continue to engage patient in RT group sessions 2-3x/week.   Javonta Gronau-McCall, LRT,CTRS 03/04/2024 1:01 PM

## 2024-03-04 NOTE — Group Note (Signed)
 Date:  03/04/2024 Time:  5:08 PM  Group Topic/Focus:  Goals Group:   The focus of this group is to help patients establish daily goals to achieve during treatment and discuss how the patient can incorporate goal setting into their daily lives to aide in recovery. Orientation:   The focus of this group is to educate the patient on the purpose and policies of crisis stabilization and provide a format to answer questions about their admission.  The group details unit policies and expectations of patients while admitted.    Participation Level:  Did Not Attend   Allison Bolton 03/04/2024, 5:08 PM

## 2024-03-04 NOTE — Plan of Care (Signed)
   Problem: Education: Goal: Emotional status will improve Outcome: Progressing Goal: Mental status will improve Outcome: Progressing   Problem: Activity: Goal: Interest or engagement in activities will improve Outcome: Progressing

## 2024-03-04 NOTE — Care Management Important Message (Signed)
 Patient informed of right to appeal discharge, provided phone number to [facility]. Patient expressed no interest in appealing discharge at this time. CSW will continue to monitor situation.   Marcianne Ozbun, LCSWA 03/04/2024

## 2024-03-04 NOTE — Progress Notes (Addendum)
 Collateral contact - re:  Electroconvulsive therapy (ECT)   [facility] [address] [place], [place] [address] Phone:  [phone]   There are no beds available.  CSW was advised to call daily.     [facility] Electroconvulsive therapy (ECT) [address] [place], [place], [place] [address] ECT line: [phone], x 2  CSW was advised to call [phone] and make a referral.  CSW called and there was no response.   Jamarques Pinedo, LCSWA 03/04/2024
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
## 2024-03-04 NOTE — [facility] Group Notes (Signed)
 Adult Psychoeducational Group Note  Date:  03/04/2024 Time:  8:45 PM  Group Topic/Focus:  Wrap-Up Group:   The focus of this group is to help patients review their daily goal of treatment and discuss progress on daily workbooks.  Participation Level:  Did Not Attend  Allison Bolton 03/04/2024, 8:45 PM

## 2024-03-04 NOTE — Progress Notes (Signed)
(  Sleep Hours) - 7.25 (Any PRNs that were needed, meds refused, or side effects to meds)- N/A (Any disturbances and when (visitation, over night)- N/A (Concerns raised by the patient)- N/A (SI/HI/AVH)- DENIES

## 2024-03-05 DIAGNOSIS — F312 Bipolar disorder, current episode manic severe with psychotic features: Secondary | ICD-10-CM | POA: Diagnosis not present

## 2024-03-05 NOTE — Progress Notes (Signed)
 Tour of Duty:  [name], RN, 03/05/24, Tour of Duty: 0700-1900  SI/HI/AVH: Denies  Self-Reported   Mood: Negative  Anxiety: Denies, but observable Depression: Denies Irritability: Denies  Broset  Violence Prevention Guidelines *See Row Information*: Moderate Violence Risk interventions implemented   LBM  Last BM Date : 03/05/24   Pain: present, PRN provided (see MAR)  Patient Refusals (including Rx): No  Shift Summary: Patient observed to be anxious on unit, generally isolative to room. Patient able to make needs known. Patient remains thoroughly disheveled. Patient observed to engage inappropriately with staff and peers, isolative, evasive and minimal engagement. Patient taking medications as prescribed. This shift, PRN medication requested and administered. No observed or reported side effects to medication. No observed or reported agitation, aggression, or other acute emotional distress. No observed or reported physical abnormalities or concerns.   Last Vitals  Vitals Weight: 80.7 kg Temp: (!) 97.5 F (36.4 C) Temp Source: Oral Pulse Rate: 79 Resp: 16 BP: 109/82 Patient Position: (not recorded)  Admission Type  Psych Admission Type (Psych Patients Only) Admission Status: Voluntary Date 72 hour document signed : (not recorded) Time 72 hour document signed : (not recorded) Provider Notified (First and Last Name) (see details for LINK to note): (not recorded)   Psychosocial Assessment  Psychosocial Assessment Patient Complaints: None Eye Contact: Poor Facial Expression: Anxious, Pinched Affect: Anxious, Preoccupied Speech: Slurred, Soft Interaction: Childlike, Evasive Motor Activity: Restless Appearance/Hygiene: Disheveled, Poor hygiene Behavior Characteristics: Cooperative, Anxious Mood: Anxious, Preoccupied   Aggressive Behavior  Targets: (not recorded)   Thought Process  Thought Process Coherency: Disorganized, Tangential Content:  Preoccupation Delusions: Paranoid Perception: Derealization Hallucination: None reported or observed Judgment: Poor Confusion: Mild  Danger to Self/Others  Danger to Self Current suicidal ideation?: Denies Description of Suicide Plan: (not recorded) Self-Injurious Behavior: (not recorded) Agreement Not to Harm Self: (not recorded) Description of Agreement: (not recorded) Danger to Others: None reported or observed

## 2024-03-05 NOTE — Progress Notes (Signed)
   03/05/24 1940  Psych Admission Type (Psych Patients Only)  Admission Status Voluntary  Psychosocial Assessment  Patient Complaints None  Eye Contact Poor  Facial Expression Anxious  Affect Anxious  Speech Soft;Slow  Interaction Minimal  Motor Activity Restless  Appearance/Hygiene Disheveled;Poor hygiene;In hospital gown  Behavior Characteristics Cooperative;Anxious  Mood Anxious;Pleasant  Thought Process  Coherency Disorganized;Tangential  Content Preoccupation  Delusions Paranoid  Perception Derealization  Hallucination None reported or observed  Judgment Poor  Confusion None  Danger to Self  Current suicidal ideation? Denies  Danger to Others  Danger to Others None reported or observed

## 2024-03-05 NOTE — Plan of Care (Signed)
   Problem: Education: Goal: Emotional status will improve Outcome: Not Progressing Goal: Mental status will improve Outcome: Not Progressing   Problem: Activity: Goal: Interest or engagement in activities will improve Outcome: Not Progressing

## 2024-03-05 NOTE — Plan of Care (Signed)
   Problem: Education: Goal: Emotional status will improve Outcome: Not Progressing Goal: Mental status will improve Outcome: Not Progressing

## 2024-03-05 NOTE — Progress Notes (Signed)
(  Sleep Hours) -7.5 (Any PRNs that were needed, meds refused, or side effects to meds)- none (Any disturbances and when (visitation, over night)-none (Concerns raised by the patient)- none (SI/HI/AVH)- denies all

## 2024-03-05 NOTE — Group Note (Signed)
 [facility] LCSW Group Therapy Note   Group Date: 03/05/2024 Start Time: 1515 End Time: 1614   Type of Therapy/Topic:  Group Therapy:  Balancing Life Stressors with Support.   Participation Level:  Did Not Attend     Hunter JONELLE Lever, LCSWA

## 2024-03-05 NOTE — Progress Notes (Signed)
 [facility] MD Progress Note  03/05/2024 11:16 AM Allison Bolton  MRN:  [id number]  Principal Problem: Bipolar affective disorder, current episode manic with psychotic symptoms (HCC) Diagnosis: Principal Problem:   Bipolar affective disorder, current episode manic with psychotic symptoms (HCC)   Reason for Admission:  Allison Bolton is a 53-yr-old female with a past psychiatric history of substance-induced psychosis, bipolar I disorder, stimulant use disorder, GAD who presented on 02/17/24 to [facility] ED with a chief complaint of anxiety. In the ED, she was found to be acutely psychotic with tangential speech, agitation, and delusional thought content. Patient was admitted involuntarily to [facility] for further evaluation and management (admitted on 02/18/2024, total  LOS: 16 days )   ASSESSMENT:  Patient has had very limited progress in symptoms since admission despite being on full trials of haloperidol , olanzapine , and Depakote . As of 8/30, she remains disorganized, tangential, delusional and emotionally labile with pressured speech. We are pursuing ECT as an alternative treatment option given patient's non-responsiveness to antipsychotics. We have been in contact with coordinators and social work to facilitate transfer to an outside hospital systems for ECT. Patient was accepted at [facility], but they do not have a bed currently available. We will continue to reach out to [facility] regarding updates on bed availability. We are also reaching out to [facility] for ECT treatment but have not yet had contact with them.   PLAN: # Substance-induced psychosis # Bipolar disorder -- Continue haloperidol  10 mg bid -- Continue olanzapine  10 mg qAM + 15 mg qhs -- Continue ativan  1 mg bid  -- Continue Depakote  DR 750 mg q12h  --VPA level on 8/20 was 89 - Trazodone  50 mg at bedtime for insomnia -- Discontinued prolixin   Disposition Planning: --Estimated LOS: 21 days --Estimated Discharge Date: TBD --Barriers to  Discharge: psychosis --Discharge Goals: Transfer patient to outside hospital system for ECT treatment  Subjective: The patient was seen and evaluated on the unit. On assessment today, patient is seen lying in bed sleeping. She is very somnolent. Reports she is eating and sleeping well. She denies SI/HI/AVH.   Objective: Chart Review from last 24 hours:  The patient's chart was reviewed and nursing notes were reviewed. The patient's case was discussed in multidisciplinary team meeting.  - Overnight events to report per chart review / staff report: none - Patient took all prescribed medications: Yes - Patient received the following PRNs: None  Current Medications: Current Facility-Administered Medications  Medication Dose Route Frequency Provider Last Rate Last Admin   acetaminophen  (TYLENOL ) tablet 650 mg  650 mg Oral Q6H PRN White, Patrice L, NP   650 mg at 03/05/24 1008   alum & mag hydroxide-simeth (MAALOX/MYLANTA) 200-200-20 MG/5ML suspension 30 mL  30 mL Oral Q4H PRN White, Patrice L, NP   30 mL at 02/27/24 2357   benztropine  (COGENTIN ) tablet 1 mg  1 mg Oral BID White, Patrice L, NP   1 mg at 03/05/24 0757   divalproex  (DEPAKOTE ) DR tablet 750 mg  750 mg Oral Q12H Ji, Andrew, MD   750 mg at 03/05/24 0757   haloperidol  (HALDOL ) tablet 10 mg  10 mg Oral Q12H Ji, Andrew, MD   10 mg at 03/05/24 0757   hydrOXYzine  (ATARAX ) tablet 25 mg  25 mg Oral TID PRN White, Patrice L, NP   25 mg at 03/02/24 1627   LORazepam  (ATIVAN ) tablet 1 mg  1 mg Oral Q12H Lynnette Barter, MD   1 mg at 03/05/24 [phone]  magnesium  hydroxide (MILK OF MAGNESIA) suspension 30 mL  30 mL Oral Daily PRN White, Patrice L, NP       nicotine  polacrilex (NICORETTE ) gum 2 mg  2 mg Oral PRN Trudy Carwin, NP   2 mg at 02/23/24 1823   OLANZapine  (ZYPREXA ) injection 10 mg  10 mg Intramuscular TID PRN White, Patrice L, NP       OLANZapine  (ZYPREXA ) injection 5 mg  5 mg Intramuscular TID PRN White, Patrice L, NP       OLANZapine  zydis  (ZYPREXA ) disintegrating tablet 10 mg  10 mg Oral Daily Mannie Ashley SAILOR, MD   10 mg at 03/05/24 0757   OLANZapine  zydis (ZYPREXA ) disintegrating tablet 15 mg  15 mg Oral QHS Stevens, Briana N, MD   15 mg at 03/04/24 2102   OLANZapine  zydis (ZYPREXA ) disintegrating tablet 5 mg  5 mg Oral TID PRN Teresa Jes L, NP   5 mg at 02/26/24 2050   ondansetron  (ZOFRAN -ODT) disintegrating tablet 4 mg  4 mg Oral Q8H PRN Lynnette Barter, MD   4 mg at 03/03/24 9383   traZODone  (DESYREL ) tablet 50 mg  50 mg Oral QHS White, Patrice L, NP   50 mg at 03/04/24 2102    Lab Results:  No results found for this or any previous visit (from the past 48 hours).   Blood Alcohol level:  Lab Results  Component Value Date   [facility] <15 02/17/2024   ETH <15 01/14/2024    Metabolic Labs: Lab Results  Component Value Date   HGBA1C 5.1 01/18/2024   MPG 99.67 01/18/2024   MPG 105.41 05/11/2019   Lab Results  Component Value Date   PROLACTIN 24.1 (H) 05/11/2019   Lab Results  Component Value Date   CHOL 154 01/18/2024   TRIG 180 (H) 01/18/2024   HDL 49 01/18/2024   CHOLHDL 3.1 01/18/2024   VLDL 36 01/18/2024   LDLCALC 69 01/18/2024   LDLCALC 82 05/11/2019    Physical Findings: AIMS: No  CIWA:    COWS:     Psychiatric Specialty Exam: General Appearance: Disheveled   Eye Contact: Minimal   Speech: Pressured   Volume: Increased   Mood: Labile   Affect: Labile   Thought Content: Illogical; Tangential   Suicidal Thoughts: Denies      Homicidal Thoughts: Denies      Thought Process: Disorganized   Orientation: Partial     Memory: Immediate Poor; Recent Poor   Judgment: Poor   Insight: Poor   Concentration: Poor   Recall: Poor   Fund of Knowledge: Fair   Language: Fair   Psychomotor Activity: Psychomotor Activity: Normal       Assets: Physical Health   Sleep: Good        Review of Systems Review of Systems  Respiratory: Negative.    Cardiovascular:  Negative.   Gastrointestinal: Negative.     Vital Signs: Blood pressure 90/77, pulse 84, temperature (!) 97.5 F (36.4 C), temperature source Oral, resp. rate 16, height 5' 6 (1.676 m), weight 80.7 kg, SpO2 99%. Body mass index is 28.71 kg/m. Physical Exam Vitals and nursing note reviewed.  Constitutional:      General: She is not in acute distress.    Appearance: She is not ill-appearing.  Pulmonary:     Effort: Pulmonary effort is normal. No respiratory distress.  Neurological:     General: No focal deficit present.     Mental Status: She is alert.     Signed:  Penne Mori, DO, PGY-1 03/05/2024, 11:16 AM

## 2024-03-05 NOTE — Group Note (Signed)
 Date:  03/05/2024 Time:  8:27 PM  Group Topic/Focus:  Wrap-Up Group:   The focus of this group is to help patients review their daily goal of treatment and discuss progress on daily workbooks.    Additional Comments:  Pt was encouraged, but opted out of attending wrap up group.   Rosella DELENA Pouch 03/05/2024, 8:27 PM

## 2024-03-06 DIAGNOSIS — F312 Bipolar disorder, current episode manic severe with psychotic features: Secondary | ICD-10-CM | POA: Diagnosis not present

## 2024-03-06 NOTE — Progress Notes (Signed)
 Assisted patient with shower and linens changed tonight.    03/06/24 2038  Psych Admission Type (Psych Patients Only)  Admission Status Voluntary  Psychosocial Assessment  Patient Complaints None  Eye Contact Poor  Facial Expression Anxious  Affect Anxious  Speech Soft;Slow  Interaction Minimal  Motor Activity Restless  Appearance/Hygiene In hospital gown;Unremarkable  Behavior Characteristics Cooperative;Appropriate to situation;Anxious  Mood Anxious;Pleasant  Thought Process  Coherency Disorganized;Tangential  Content Preoccupation  Delusions Paranoid  Perception Derealization  Hallucination None reported or observed  Judgment Poor  Confusion None  Danger to Self  Current suicidal ideation? Denies  Danger to Others  Danger to Others None reported or observed

## 2024-03-06 NOTE — Plan of Care (Signed)
  Problem: Education: Goal: Emotional status will improve Outcome: Not Progressing Goal: Mental status will improve Outcome: Not Progressing   Problem: Self-Care: Goal: Ability to participate in self-care as condition permits will improve Outcome: Not Progressing

## 2024-03-06 NOTE — Progress Notes (Signed)
 [facility] MD Progress Note  03/06/2024 11:25 AM Allison Bolton  MRN:  [id number] Subjective:   Allison Bolton is a 53 yr old female who presented on 02/17/24 to [facility] ED with a chief complaint of anxiety. In the ED, she was found to be acutely psychotic with tangential speech, agitation, and delusional thought content. Patient was admitted involuntarily to [facility] on 8/14.  PPHx is significant for Substance-Induced Psychosis, Bipolar I Disorder, Stimulant Use Disorder, GAD, Suicide Attempt (2007), and Prior Psychiatric Hospitalizations (last- [facility] 01/2024).   Case was discussed in the multidisciplinary team. MAR was reviewed and patient was compliant with medications.  She received PRN Tylenol  and Zofran  yesterday.   Psychiatric Team made the following recommendations yesterday: -Pursue transfer for ECT    On interview today patient reports she slept good last night.  She reports her appetite is doing good.  She reports no SI, HI, or AVH.  She reports no issues with her medications.  She reports that she needs to be discharged because she needs to see her grandchildren.  When asked how her visit with her daughter went yesterday she reports it went fine.    Principal Problem: Bipolar affective disorder, current episode manic with psychotic symptoms (HCC) Diagnosis: Principal Problem:   Bipolar affective disorder, current episode manic with psychotic symptoms (HCC)  Total Time spent with patient:  I personally spent 35 minutes on the unit in direct patient care. The direct patient care time included face-to-face time with the patient, reviewing the patient's chart, communicating with other professionals, and coordinating care.    Past Psychiatric History:  Substance-Induced Psychosis, Bipolar I Disorder, Stimulant Use Disorder, GAD, Suicide Attempt (2007), and Prior Psychiatric Hospitalizations (last- [facility] 01/2024).  Past Medical History:  Past Medical History:  Diagnosis Date    Anxiety    Arthritis    Asthma    Bipolar 1 disorder (HCC)    Diverticulosis    Dysrhythmia    sts I have heart palpitations   Fibromyalgia    H/O hiatal hernia    4   Headache(784.0)    High blood pressure    Meniere's disease    S/P colonoscopy    Dr. Golda 2010: few small diverticula at sigmoid. otherwise normal.    Shortness of breath     Past Surgical History:  Procedure Laterality Date   ABDOMINAL HYSTERECTOMY     APPENDECTOMY     CESAREAN SECTION     CHOLECYSTECTOMY     COLONOSCOPY N/A 12/25/2023   Procedure: COLONOSCOPY;  Surgeon: Eartha Angelia Sieving, MD;  Location: [facility] ENDO [facility];  Service: Gastroenterology;  Laterality: N/A;  11:30am, asa 1   exploratory laparoscopy     FOOT SURGERY     HEMORRHOID SURGERY     LAPAROSCOPIC APPENDECTOMY  02/19/2011   Procedure: APPENDECTOMY LAPAROSCOPIC;  Surgeon: Oneil DELENA Budge;  Location: [facility] ORS;  Service: General;  Laterality: N/A;   LAPAROSCOPY  02/19/2011   Procedure: LAPAROSCOPY DIAGNOSTIC;  Surgeon: Oneil DELENA Budge;  Location: [facility] ORS;  Service: General;  Laterality: N/A;   left ovarian removal     multiple hernia repairs     Right ovarian removal     TONSILLECTOMY     TONSILLECTOMY AND ADENOIDECTOMY     tubes in ears     UMBILICAL HERNIA REPAIR  Dec 2011   Dr. Budge   wisdom teeth removal     Family History:  Family History  Problem Relation Age of Onset   Thyroid   disease Mother    Colon cancer Father    Breast cancer Maternal Aunt    Colon cancer Brother    Colon cancer Other        paternal and maternal grandfather   Meniere's disease Other    Anesthesia problems Neg Hx    Hypotension Neg Hx    Malignant hyperthermia Neg Hx    Pseudochol deficiency Neg Hx    Family Psychiatric  History:  None Reported   Social History:  Social History   Substance and Sexual Activity  Alcohol Use No   Comment: not since June 2012     Social History   Substance and Sexual Activity  Drug Use Yes   Types:  Marijuana   Comment: patient denies any-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    Social History   Socioeconomic History   Marital status: Legally Separated    Spouse name: Not on file   Number of children: Not on file   Years of education: Not on file   Highest education level: Not on file  Occupational History   Not on file  Tobacco Use   Smoking status: Every Day    Current packs/day: 0.00    Average packs/day: 0.5 packs/day for 37.3 years (18.7 ttl pk-yrs)    Types: Cigarettes    Start date: 07/07/1981    Last attempt to quit: 11/05/2018    Years since quitting: 5.3   Smokeless tobacco: Never  Vaping Use   Vaping status: Never Used  Substance and Sexual Activity   Alcohol use: No    Comment: not since June 2012   Drug use: Yes    Types: Marijuana    Comment: patient denies any-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   Sexual activity: Never    Birth control/protection: Surgical  Other Topics Concern   Not on file  Social History Narrative   Not on file   Social Drivers of Health   Financial Resource Strain: Not on file  Food Insecurity: Food Insecurity Present (02/18/2024)   Hunger Vital Sign    Worried About Running Out of Food in the Last Year: Sometimes true    Ran Out of Food in the Last Year: Sometimes true  Transportation Needs: Unmet Transportation Needs (02/18/2024)   PRAPARE - Administrator, Civil Service (Medical): Yes    Lack of Transportation (Non-Medical): Yes  Physical Activity: Not on file  Stress: Not on file  Social Connections: Unknown (10/14/2021)   Received from [facility]   Social Connections    Do your friends and family support you?: Not on file    What agencies support you?: Not on file   Additional Social History:                         Sleep: Good Estimated Sleeping Duration (Last 24 Hours): 4.00-6.00 hours  Appetite:  Good  Current Medications: Current Facility-Administered  Medications  Medication Dose Route Frequency Provider Last Rate Last Admin   acetaminophen  (TYLENOL ) tablet 650 mg  650 mg Oral Q6H PRN White, Patrice L, NP   650 mg at 03/05/24 1008   alum & mag hydroxide-simeth (MAALOX/MYLANTA) 200-200-20 MG/5ML suspension 30 mL  30 mL Oral Q4H PRN White, Patrice L, NP   30 mL at 02/27/24 2357   benztropine  (COGENTIN ) tablet 1 mg  1 mg Oral BID White, Patrice L, NP   1 mg  at 03/06/24 0802   divalproex  (DEPAKOTE ) DR tablet 750 mg  750 mg Oral Q12H Lynnette Barter, MD   750 mg at 03/06/24 0802   haloperidol  (HALDOL ) tablet 10 mg  10 mg Oral Q12H Ji, Andrew, MD   10 mg at 03/06/24 0802   hydrOXYzine  (ATARAX ) tablet 25 mg  25 mg Oral TID PRN White, Patrice L, NP   25 mg at 03/02/24 1627   LORazepam  (ATIVAN ) tablet 1 mg  1 mg Oral Q12H Lynnette Barter, MD   1 mg at 03/06/24 9197   magnesium  hydroxide (MILK OF MAGNESIA) suspension 30 mL  30 mL Oral Daily PRN White, Patrice L, NP       nicotine  polacrilex (NICORETTE ) gum 2 mg  2 mg Oral PRN Trudy Carwin, NP   2 mg at 03/06/24 9185   OLANZapine  (ZYPREXA ) injection 10 mg  10 mg Intramuscular TID PRN White, Patrice L, NP       OLANZapine  (ZYPREXA ) injection 5 mg  5 mg Intramuscular TID PRN White, Patrice L, NP       OLANZapine  zydis (ZYPREXA ) disintegrating tablet 10 mg  10 mg Oral Daily Stevens, Briana N, MD   10 mg at 03/06/24 0802   OLANZapine  zydis (ZYPREXA ) disintegrating tablet 15 mg  15 mg Oral QHS Mannie Ashley SAILOR, MD   15 mg at 03/05/24 2005   OLANZapine  zydis (ZYPREXA ) disintegrating tablet 5 mg  5 mg Oral TID PRN Teresa Jes L, NP   5 mg at 02/26/24 2050   ondansetron  (ZOFRAN -ODT) disintegrating tablet 4 mg  4 mg Oral Q8H PRN Lynnette Barter, MD   4 mg at 03/05/24 2224   traZODone  (DESYREL ) tablet 50 mg  50 mg Oral QHS White, Patrice L, NP   50 mg at 03/05/24 2005    Lab Results: No results found for this or any previous visit (from the past 48 hours).  Blood Alcohol level:  Lab Results  Component Value Date   [facility]  <15 02/17/2024   ETH <15 01/14/2024    Metabolic Disorder Labs: Lab Results  Component Value Date   HGBA1C 5.1 01/18/2024   MPG 99.67 01/18/2024   MPG 105.41 05/11/2019   Lab Results  Component Value Date   PROLACTIN 24.1 (H) 05/11/2019   Lab Results  Component Value Date   CHOL 154 01/18/2024   TRIG 180 (H) 01/18/2024   HDL 49 01/18/2024   CHOLHDL 3.1 01/18/2024   VLDL 36 01/18/2024   LDLCALC 69 01/18/2024   LDLCALC 82 05/11/2019    Physical Findings: AIMS:  ,  ,  ,  ,  ,  ,   CIWA:    COWS:     Musculoskeletal: Strength & Muscle Tone: within normal limits Gait & Station: normal Patient leans: N/A  Psychiatric Specialty Exam:  Presentation  General Appearance:  Disheveled  Eye Contact: Minimal  Speech: Pressured; Garbled  Speech Volume: Increased  Handedness:No data recorded  Mood and Affect  Mood: Labile  Affect: Labile; Tearful; Restricted   Thought Process  Thought Processes: Disorganized  Descriptions of Associations:Tangential  Orientation:Partial  Thought Content:Delusions  History of Schizophrenia/Schizoaffective disorder:No  Duration of Psychotic Symptoms:Greater than six months  Hallucinations:Hallucinations: None  Ideas of Reference:None  Suicidal Thoughts:Suicidal Thoughts: No  Homicidal Thoughts:Homicidal Thoughts: No   Sensorium  Memory: Immediate Poor  Judgment: Impaired  Insight: Lacking   Executive Functions  Concentration: Poor  Attention Span: Poor  Recall: Poor  Fund of Knowledge: Fair  Language: Fair   Psychomotor  Activity  Psychomotor Activity:Psychomotor Activity: Decreased   Assets  Assets: Physical Health   Sleep  Sleep:Sleep: Good    Physical Exam: Physical Exam Vitals and nursing note reviewed.  Constitutional:      General: She is not in acute distress.    Appearance: She is normal weight. She is not ill-appearing or toxic-appearing.  HENT:     Head:  Normocephalic and atraumatic.  Pulmonary:     Effort: Pulmonary effort is normal.  Musculoskeletal:        General: Normal range of motion.  Neurological:     General: No focal deficit present.     Mental Status: She is alert.    Review of Systems  Respiratory:  Negative for cough and shortness of breath.   Cardiovascular:  Negative for chest pain.  Gastrointestinal:  Negative for abdominal pain, constipation, diarrhea, nausea and vomiting.  Neurological:  Negative for dizziness, weakness and headaches.  Psychiatric/Behavioral:  Negative for depression, hallucinations and suicidal ideas. The patient is not nervous/anxious.    Blood pressure 104/70, pulse 85, temperature (!) 97.5 F (36.4 C), temperature source Oral, resp. rate 16, height 5' 6 (1.676 m), weight 80.7 kg, SpO2 99%. Body mass index is 28.71 kg/m.   Treatment Plan Summary: Daily contact with patient to assess and evaluate symptoms and progress in treatment and Medication management  Allison Bolton is a 53 yr old female who presented on 02/17/24 [facility] ED with a chief complaint of anxiety. In the ED, she was found to be acutely psychotic with tangential speech, agitation, and delusional thought content. Patient was admitted involuntarily to [facility] on 8/14.  PPHx is significant for Substance-Induced Psychosis, Bipolar I Disorder, Stimulant Use Disorder, GAD, Suicide Attempt (2007), and Prior Psychiatric Hospitalizations (last- [facility] 01/2024).    Allison Bolton continues to be very disorganized and labile crying through most of the interview today.  We continue to reach out to other facilities for transfer so she can receive ECT.  We will not make any changes to her medications at this time.  We will continue to monitor.    Substance-induced psychosis  Bipolar disorder: -Continue Haldol  10 mg q12 for psychosis and mood stability -Continue Zyprexa  10 mg AM & 15 mg QHS for psychosis and mood stability -Continue Depakote  DR  750 mg q12 for mood stability -Continue Ativan  1 mg q12 for agitation  -Continue Cogentin  1 mg BID for Drug Induced EPS -Continue Agitation Protocol: Zyprexa    Nicotine  Dependence: -Continue Nicotine  Gum 2 mg PRN   -Continue Zofran  4 mg q8 PRN nausea/vomiting -Continue PRN's: Tylenol , Maalox, Atarax , Milk of Magnesia, Trazodone    --  The risks/benefits/side-effects/alternatives to medications were discussed in detail with the patient and time was given for questions. The patient consents to medication trials.                -- Metabolic profile and EKG monitoring obtained while on an atypical antipsychotic (BMI: 28.73 Lipid Panel: WNL except Trig: 180 HbgA1c: 5.1)              -- Encouraged patient to participate in unit milieu and in scheduled group therapies              -- Short Term Goals: Ability to identify changes in lifestyle to reduce recurrence of condition will improve, Ability to verbalize feelings will improve, Ability to disclose and discuss suicidal ideas, Ability to demonstrate self-control will improve, Ability to identify and develop effective coping behaviors will improve, Ability  to maintain clinical measurements within normal limits will improve, Compliance with prescribed medications will improve, and Ability to identify triggers associated with substance abuse/mental health issues will improve             -- Long Term Goals: Improvement in symptoms so as ready for discharge   Safety and Monitoring:             -- Involuntary admission to inpatient psychiatric unit for safety, stabilization and treatment             -- Daily contact with patient to assess and evaluate symptoms and progress in treatment             -- Patient's case to be discussed in multi-disciplinary team meeting             -- Observation Level : q15 minute checks             -- Vital signs:  q12 hours             -- Precautions: suicide, elopement, and assault  Discharge Planning:               -- Social work and case management to assist with discharge planning and identification of hospital follow-up needs prior to discharge             -- Estimated LOS: 3-7 more days             -- Discharge Concerns: Need to establish a safety plan; Medication compliance and effectiveness             -- Discharge Goals: Return home with outpatient referrals for mental health follow-up including medication management/psychotherapy   Marsa GORMAN Rosser, DO 03/06/2024, 11:25 AM

## 2024-03-06 NOTE — Progress Notes (Signed)
 Tour of Duty:  [name], RN, 03/06/24, {Tour of Duty:33100}  SI/HI/AVH: {Denies/Endorses/Refuses:33101}  Self-Reported   Mood: {Mood:33103}  Anxiety: {Denies/Endorses/Refuses:33101} Depression: {Denies/Endorses/Refuses:33101} Irritability: {Denies/Endorses/Refuses:33101}  Broset  Violence Prevention Guidelines *See Row Information*: Moderate Violence Risk interventions implemented   LBM  Last BM Date : 03/05/24   Pain: {Pain:33105}  Patient Refusals (including Rx): {Yes/No:33106}  Shift Summary:    Last Vitals  Vitals Weight: 80.7 kg Temp: (!) 97.5 F (36.4 C) Temp Source: Oral Pulse Rate: 85 Resp: 16 BP: 104/70 Patient Position: (not recorded)  Admission Type  Psych Admission Type (Psych Patients Only) Admission Status: Voluntary Date 72 hour document signed : (not recorded) Time 72 hour document signed : (not recorded) Provider Notified (First and Last Name) (see details for LINK to note): (not recorded)   Psychosocial Assessment  Psychosocial Assessment Patient Complaints: None Eye Contact: Poor Facial Expression: Anxious Affect: Anxious Speech: Slurred, Soft Interaction: Childlike, Evasive Motor Activity: Restless Appearance/Hygiene: Disheveled, Poor hygiene Behavior Characteristics: Cooperative, Anxious Mood: Anxious   Aggressive Behavior  Targets: (not recorded)   Thought Process  Thought Process Coherency: Disorganized, Tangential Content: Preoccupation Delusions: Paranoid Perception: Derealization Hallucination: None reported or observed Judgment: Poor Confusion: Mild  Danger to Self/Others  Danger to Self Current suicidal ideation?: Denies Description of Suicide Plan: (not recorded) Self-Injurious Behavior: (not recorded) Agreement Not to Harm Self: (not recorded) Description of Agreement: (not recorded) Danger to Others: None reported or observed

## 2024-03-06 NOTE — Group Note (Signed)
 Date:  03/06/2024 Time:  8:22 PM  Group Topic/Focus:  Wrap-Up Group:   The focus of this group is to help patients review their daily goal of treatment and discuss progress on daily workbooks.    Participation Level:  Active  Participation Quality:  Appropriate  Affect:  Appropriate  Cognitive:  Appropriate  Insight: Appropriate  Engagement in Group:  Engaged  Modes of Intervention:  Education and Exploration  Additional Comments:  Patient attended and participated in group tonight.  She reports that her goal was to get rest today and she did.  Gwenn Chillington Dacosta 03/06/2024, 8:22 PM

## 2024-03-06 NOTE — Progress Notes (Signed)
(  Sleep Hours) - 6 (Any PRNs that were needed, meds refused, or side effects to meds)- zofran  (Any disturbances and when (visitation, over night)- had difficulty staying asleep, not sure if pt is sleeping during day (Concerns raised by the patient)- none (SI/HI/AVH)- denies all

## 2024-03-07 DIAGNOSIS — F312 Bipolar disorder, current episode manic severe with psychotic features: Secondary | ICD-10-CM | POA: Diagnosis not present

## 2024-03-07 LAB — COMPREHENSIVE METABOLIC PANEL WITH GFR
ALT: 10 U/L (ref 0–44)
AST: 12 U/L — ABNORMAL LOW (ref 15–41)
Albumin: 3.9 g/dL (ref 3.5–5.0)
Alkaline Phosphatase: 58 U/L (ref 38–126)
Anion gap: 13 (ref 5–15)
BUN: 14 mg/dL (ref 6–20)
CO2: 22 mmol/L (ref 22–32)
Calcium: 9.4 mg/dL (ref 8.9–10.3)
Chloride: 107 mmol/L (ref 98–111)
Creatinine, Ser: 0.65 mg/dL (ref 0.44–1.00)
GFR, Estimated: >60 mL/min (ref >60)
Glucose, Bld: 83 mg/dL (ref 70–99)
Potassium: 4.2 mmol/L (ref 3.5–5.1)
Sodium: 142 mmol/L (ref 135–145)
Total Bilirubin: 0.4 mg/dL (ref 0.0–1.2)
Total Protein: 6.1 g/dL — ABNORMAL LOW (ref 6.5–8.1)

## 2024-03-07 LAB — CBC WITH DIFFERENTIAL/PLATELET
Abs Immature Granulocytes: 0.03 K/uL (ref 0.00–0.07)
Basophils Absolute: 0.1 K/uL (ref 0.0–0.1)
Basophils Relative: 1 %
Eosinophils Absolute: 0.4 K/uL (ref 0.0–0.5)
Eosinophils Relative: 5 %
HCT: 46.9 % — ABNORMAL HIGH (ref 36.0–46.0)
Hemoglobin: 15.4 g/dL — ABNORMAL HIGH (ref 12.0–15.0)
Immature Granulocytes: 0 %
Lymphocytes Relative: 47 %
Lymphs Abs: 3.8 K/uL (ref 0.7–4.0)
MCH: 28.4 pg (ref 26.0–34.0)
MCHC: 32.8 g/dL (ref 30.0–36.0)
MCV: 86.5 fL (ref 80.0–100.0)
Monocytes Absolute: 0.8 K/uL (ref 0.1–1.0)
Monocytes Relative: 10 %
Neutro Abs: 3 K/uL (ref 1.7–7.7)
Neutrophils Relative %: 37 %
Platelets: 233 K/uL (ref 150–400)
RBC: 5.42 MIL/uL — ABNORMAL HIGH (ref 3.87–5.11)
RDW: 12.5 % (ref 11.5–15.5)
WBC: 8.1 K/uL (ref 4.0–10.5)
nRBC: 0 % (ref 0.0–0.2)

## 2024-03-07 LAB — VALPROIC ACID LEVEL: Valproic Acid Lvl: 87 ug/mL (ref 50–100)

## 2024-03-07 NOTE — Progress Notes (Signed)
   03/07/24 2300  Psych Admission Type (Psych Patients Only)  Admission Status Voluntary  Psychosocial Assessment  Patient Complaints None  Eye Contact Poor  Facial Expression Anxious;Fixed smile  Affect Preoccupied  Speech Soft  Interaction Minimal  Motor Activity Other (Comment)  Appearance/Hygiene Disheveled;In hospital gown  Behavior Characteristics Cooperative  Mood Anxious;Pleasant  Aggressive Behavior  Effect No apparent injury  Thought Process  Coherency Disorganized  Content Preoccupation  Delusions None reported or observed  Perception Derealization  Hallucination None reported or observed  Judgment Poor  Confusion None  Danger to Self  Current suicidal ideation? Denies  Danger to Others  Danger to Others None reported or observed

## 2024-03-07 NOTE — Progress Notes (Signed)
 [facility] MD Progress Note  03/07/2024 11:16 AM MAGGIE SENSENEY  MRN:  [id number] Subjective:   Allison Bolton is a 53 yr old female who presented on 02/17/24 to [facility] ED with a chief complaint of anxiety. In the ED, she was found to be acutely psychotic with tangential speech, agitation, and delusional thought content. Patient was admitted involuntarily to [facility] on 8/14.  PPHx is significant for Substance-Induced Psychosis, Bipolar I Disorder, Stimulant Use Disorder, GAD, Suicide Attempt (2007), and Prior Psychiatric Hospitalizations (last- [facility] 01/2024).   Case was discussed in the multidisciplinary team. MAR was reviewed and patient was compliant with medications.  She received PRN Maalox yesterday.   Psychiatric Team made the following recommendations yesterday: -Pursue transfer for ECT    On interview today patient reports she slept good last night.  She reports her appetite is doing good.  She reports no SI, HI, or AVH.  She reports no issues with her medications.  She reports multiple times that she needs to be discharged.  She would not answer other questions and stated she wanted to go to sleep.  She reports no other concerns at present.  Principal Problem: Bipolar affective disorder, current episode manic with psychotic symptoms (HCC) Diagnosis: Principal Problem:   Bipolar affective disorder, current episode manic with psychotic symptoms (HCC)  Total Time spent with patient:  I personally spent 35 minutes on the unit in direct patient care. The direct patient care time included face-to-face time with the patient, reviewing the patient's chart, communicating with other professionals, and coordinating care.    Past Psychiatric History:  Substance-Induced Psychosis, Bipolar I Disorder, Stimulant Use Disorder, GAD, Suicide Attempt (2007), and Prior Psychiatric Hospitalizations (last- [facility] 01/2024).  Past Medical History:  Past Medical History:  Diagnosis Date   Anxiety     Arthritis    Asthma    Bipolar 1 disorder (HCC)    Diverticulosis    Dysrhythmia    sts I have heart palpitations   Fibromyalgia    H/O hiatal hernia    4   Headache(784.0)    High blood pressure    Meniere's disease    S/P colonoscopy    Dr. Golda 2010: few small diverticula at sigmoid. otherwise normal.    Shortness of breath     Past Surgical History:  Procedure Laterality Date   ABDOMINAL HYSTERECTOMY     APPENDECTOMY     CESAREAN SECTION     CHOLECYSTECTOMY     COLONOSCOPY N/A 12/25/2023   Procedure: COLONOSCOPY;  Surgeon: Eartha Angelia Sieving, MD;  Location: [facility];  Service: Gastroenterology;  Laterality: N/A;  11:30am, asa 1   exploratory laparoscopy     FOOT SURGERY     HEMORRHOID SURGERY     LAPAROSCOPIC APPENDECTOMY  02/19/2011   Procedure: APPENDECTOMY LAPAROSCOPIC;  Surgeon: Oneil DELENA Budge;  Location: [facility] ORS;  Service: General;  Laterality: N/A;   LAPAROSCOPY  02/19/2011   Procedure: LAPAROSCOPY DIAGNOSTIC;  Surgeon: Oneil DELENA Budge;  Location: [facility] ORS;  Service: General;  Laterality: N/A;   left ovarian removal     multiple hernia repairs     Right ovarian removal     TONSILLECTOMY     TONSILLECTOMY AND ADENOIDECTOMY     tubes in ears     UMBILICAL HERNIA REPAIR  Dec 2011   Dr. Budge   wisdom teeth removal     Family History:  Family History  Problem Relation Age of Onset   Thyroid  disease Mother  Colon cancer Father    Breast cancer Maternal Aunt    Colon cancer Brother    Colon cancer Other        paternal and maternal grandfather   Meniere's disease Other    Anesthesia problems Neg Hx    Hypotension Neg Hx    Malignant hyperthermia Neg Hx    Pseudochol deficiency Neg Hx    Family Psychiatric  History:  None Reported   Social History:  Social History   Substance and Sexual Activity  Alcohol Use No   Comment: not since June 2012     Social History   Substance and Sexual Activity  Drug Use Yes   Types: Marijuana    Comment: patient denies any-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    Social History   Socioeconomic History   Marital status: Legally Separated    Spouse name: Not on file   Number of children: Not on file   Years of education: Not on file   Highest education level: Not on file  Occupational History   Not on file  Tobacco Use   Smoking status: Every Day    Current packs/day: 0.00    Average packs/day: 0.5 packs/day for 37.3 years (18.7 ttl pk-yrs)    Types: Cigarettes    Start date: 07/07/1981    Last attempt to quit: 11/05/2018    Years since quitting: 5.3   Smokeless tobacco: Never  Vaping Use   Vaping status: Never Used  Substance and Sexual Activity   Alcohol use: No    Comment: not since June 2012   Drug use: Yes    Types: Marijuana    Comment: patient denies any-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   Sexual activity: Never    Birth control/protection: Surgical  Other Topics Concern   Not on file  Social History Narrative   Not on file   Social Drivers of Health   Financial Resource Strain: Not on file  Food Insecurity: Food Insecurity Present (02/18/2024)   Hunger Vital Sign    Worried About Running Out of Food in the Last Year: Sometimes true    Ran Out of Food in the Last Year: Sometimes true  Transportation Needs: Unmet Transportation Needs (02/18/2024)   PRAPARE - Administrator, Civil Service (Medical): Yes    Lack of Transportation (Non-Medical): Yes  Physical Activity: Not on file  Stress: Not on file  Social Connections: Unknown (10/14/2021)   Received from [facility]   Social Connections    Do your friends and family support you?: Not on file    What agencies support you?: Not on file   Additional Social History:                         Sleep: Good Estimated Sleeping Duration (Last 24 Hours): 8.75-10.25 hours  Appetite:  Good  Current Medications: Current Facility-Administered Medications   Medication Dose Route Frequency Provider Last Rate Last Admin   acetaminophen  (TYLENOL ) tablet 650 mg  650 mg Oral Q6H PRN White, Patrice L, NP   650 mg at 03/05/24 1008   alum & mag hydroxide-simeth (MAALOX/MYLANTA) 200-200-20 MG/5ML suspension 30 mL  30 mL Oral Q4H PRN White, Patrice L, NP   30 mL at 03/06/24 2207   benztropine  (COGENTIN ) tablet 1 mg  1 mg Oral BID White, Patrice L, NP   1 mg at 03/07/24 [phone]  divalproex  (DEPAKOTE ) DR tablet 750 mg  750 mg Oral Q12H Lynnette Barter, MD   750 mg at 03/07/24 9175   haloperidol  (HALDOL ) tablet 10 mg  10 mg Oral Q12H Ji, Andrew, MD   10 mg at 03/07/24 9175   hydrOXYzine  (ATARAX ) tablet 25 mg  25 mg Oral TID PRN White, Patrice L, NP   25 mg at 03/02/24 1627   LORazepam  (ATIVAN ) tablet 1 mg  1 mg Oral Q12H Lynnette Barter, MD   1 mg at 03/07/24 9175   magnesium  hydroxide (MILK OF MAGNESIA) suspension 30 mL  30 mL Oral Daily PRN White, Patrice L, NP       nicotine  polacrilex (NICORETTE ) gum 2 mg  2 mg Oral PRN Trudy Carwin, NP   2 mg at 03/06/24 9185   OLANZapine  (ZYPREXA ) injection 10 mg  10 mg Intramuscular TID PRN White, Patrice L, NP       OLANZapine  (ZYPREXA ) injection 5 mg  5 mg Intramuscular TID PRN White, Patrice L, NP       OLANZapine  zydis (ZYPREXA ) disintegrating tablet 10 mg  10 mg Oral Daily Stevens, Briana N, MD   10 mg at 03/07/24 0825   OLANZapine  zydis (ZYPREXA ) disintegrating tablet 15 mg  15 mg Oral QHS Stevens, Briana N, MD   15 mg at 03/06/24 2032   OLANZapine  zydis (ZYPREXA ) disintegrating tablet 5 mg  5 mg Oral TID PRN Teresa Jes L, NP   5 mg at 02/26/24 2050   ondansetron  (ZOFRAN -ODT) disintegrating tablet 4 mg  4 mg Oral Q8H PRN Lynnette Barter, MD   4 mg at 03/05/24 2224   traZODone  (DESYREL ) tablet 50 mg  50 mg Oral QHS White, Patrice L, NP   50 mg at 03/06/24 2032    Lab Results:  Results for orders placed or performed during the hospital encounter of 02/18/24 (from the past 48 hours)  Comprehensive metabolic panel with GFR      Status: Abnormal   Collection Time: 03/07/24  6:27 AM  Result Value Ref Range   Sodium 142 135 - 145 mmol/L   Potassium 4.2 3.5 - 5.1 mmol/L   Chloride 107 98 - 111 mmol/L   CO2 22 22 - 32 mmol/L   Glucose, Bld 83 70 - 99 mg/dL    Comment: Glucose reference range applies only to samples taken after fasting for at least 8 hours.   BUN 14 6 - 20 mg/dL   Creatinine, Ser 9.34 0.44 - 1.00 mg/dL   Calcium 9.4 8.9 - 89.6 mg/dL   Total Protein 6.1 (L) 6.5 - 8.1 g/dL   Albumin 3.9 3.5 - 5.0 g/dL   AST 12 (L) 15 - 41 U/L   ALT 10 0 - 44 U/L   Alkaline Phosphatase 58 38 - 126 U/L   Total Bilirubin 0.4 0.0 - 1.2 mg/dL   GFR, Estimated >39 >39 mL/min    Comment: (NOTE) Calculated using the CKD-EPI Creatinine Equation (2021)    Anion gap 13 5 - 15    Comment: Performed at [facility], [address], [place], [place] [address]  CBC with Differential/Platelet     Status: Abnormal   Collection Time: 03/07/24  6:27 AM  Result Value Ref Range   WBC 8.1 4.0 - 10.5 K/uL   RBC 5.42 (H) 3.87 - 5.11 MIL/uL   Hemoglobin 15.4 (H) 12.0 - 15.0 g/dL   HCT 53.0 (H) 63.9 - 53.9 %   MCV 86.5 80.0 - 100.0 fL  MCH 28.4 26.0 - 34.0 pg   MCHC 32.8 30.0 - 36.0 g/dL   RDW 87.4 88.4 - 84.4 %   Platelets 233 150 - 400 K/uL   nRBC 0.0 0.0 - 0.2 %   Neutrophils Relative % 37 %   Neutro Abs 3.0 1.7 - 7.7 K/uL   Lymphocytes Relative 47 %   Lymphs Abs 3.8 0.7 - 4.0 K/uL   Monocytes Relative 10 %   Monocytes Absolute 0.8 0.1 - 1.0 K/uL   Eosinophils Relative 5 %   Eosinophils Absolute 0.4 0.0 - 0.5 K/uL   Basophils Relative 1 %   Basophils Absolute 0.1 0.0 - 0.1 K/uL   Immature Granulocytes 0 %   Abs Immature Granulocytes 0.03 0.00 - 0.07 K/uL    Comment: Performed at [facility], [address] [place], [place] [address]  Valproic  acid level     Status: None   Collection Time: 03/07/24  6:27 AM  Result Value Ref Range   Valproic  Acid Lvl 87 50 - 100 ug/mL     Comment: Performed at [facility], [address], [name], [place] [address]    Blood Alcohol level:  Lab Results  Component Value Date   [facility] <15 02/17/2024   ETH <15 01/14/2024    Metabolic Disorder Labs: Lab Results  Component Value Date   HGBA1C 5.1 01/18/2024   MPG 99.67 01/18/2024   MPG 105.41 05/11/2019   Lab Results  Component Value Date   PROLACTIN 24.1 (H) 05/11/2019   Lab Results  Component Value Date   CHOL 154 01/18/2024   TRIG 180 (H) 01/18/2024   HDL 49 01/18/2024   CHOLHDL 3.1 01/18/2024   VLDL 36 01/18/2024   LDLCALC 69 01/18/2024   LDLCALC 82 05/11/2019    Physical Findings: AIMS:  ,  ,  ,  ,  ,  ,   CIWA:    COWS:     Musculoskeletal: Strength & Muscle Tone: within normal limits Gait & Station: normal Patient leans: N/A  Psychiatric Specialty Exam:  Presentation  General Appearance:  Disheveled  Eye Contact: Minimal  Speech: Garbled; Pressured  Speech Volume: Increased  Handedness:No data recorded  Mood and Affect  Mood: Labile  Affect: Restricted   Thought Process  Thought Processes: Disorganized  Descriptions of Associations:Tangential  Orientation:Partial  Thought Content:Delusions  History of Schizophrenia/Schizoaffective disorder:No  Duration of Psychotic Symptoms:Greater than six months  Hallucinations:Hallucinations: None  Ideas of Reference:None  Suicidal Thoughts:Suicidal Thoughts: No  Homicidal Thoughts:Homicidal Thoughts: No   Sensorium  Memory: Immediate Poor  Judgment: Impaired  Insight: Lacking   Executive Functions  Concentration: Poor  Attention Span: Poor  Recall: Poor  Fund of Knowledge: Fair  Language: Fair   Psychomotor Activity  Psychomotor Activity:Psychomotor Activity: Decreased   Assets  Assets: Resilience   Sleep  Sleep:Sleep: Good    Physical Exam: Physical Exam ROS Blood pressure 105/82, pulse 79, temperature (!) 97.2  F (36.2 C), temperature source Oral, resp. rate 16, height 5' 6 (1.676 m), weight 80.7 kg, SpO2 97%. Body mass index is 28.71 kg/m.   Treatment Plan Summary: Daily contact with patient to assess and evaluate symptoms and progress in treatment and Medication management  GISELL BUEHRLE is a 53 yr old female who presented on 02/17/24 to [facility] ED with a chief complaint of anxiety. In the ED, she was found to be acutely psychotic with tangential speech, agitation, and delusional thought content. Patient was admitted involuntarily to  [facility] on 8/14.  PPHx is significant for Substance-Induced Psychosis, Bipolar I Disorder, Stimulant Use Disorder, GAD, Suicide Attempt (2007), and Prior Psychiatric Hospitalizations (last- [facility] 01/2024).    Estie continues to not attend to ADL's.  She continues to be disorganized and labile repeating multiple times I need to be discharged.  We continue to contact other facilities to transfer for ECT.  We will not make any changes to her medications at this time.  We will continue to monitor.    Substance-induced psychosis  Bipolar disorder: -Continue Haldol  10 mg q12 for psychosis and mood stability -Continue Zyprexa  10 mg AM & 15 mg QHS for psychosis and mood stability -Continue Depakote  DR 750 mg q12 for mood stability -Continue Ativan  1 mg q12 for agitation  -Continue Cogentin  1 mg BID for Drug Induced EPS -Continue Agitation Protocol: Zyprexa    Nicotine  Dependence: -Continue Nicotine  Gum 2 mg PRN   -Continue Zofran  4 mg q8 PRN nausea/vomiting -Continue PRN's: Tylenol , Maalox, Atarax , Milk of Magnesia, Trazodone    --  The risks/benefits/side-effects/alternatives to medications were discussed in detail with the patient and time was given for questions. The patient consents to medication trials.                -- Metabolic profile and EKG monitoring obtained while on an atypical antipsychotic (BMI: 28.73 Lipid Panel: WNL except Trig: 180 HbgA1c:  5.1)              -- Encouraged patient to participate in unit milieu and in scheduled group therapies              -- Short Term Goals: Ability to identify changes in lifestyle to reduce recurrence of condition will improve, Ability to verbalize feelings will improve, Ability to disclose and discuss suicidal ideas, Ability to demonstrate self-control will improve, Ability to identify and develop effective coping behaviors will improve, Ability to maintain clinical measurements within normal limits will improve, Compliance with prescribed medications will improve, and Ability to identify triggers associated with substance abuse/mental health issues will improve             -- Long Term Goals: Improvement in symptoms so as ready for discharge   Safety and Monitoring:             -- Involuntary admission to inpatient psychiatric unit for safety, stabilization and treatment             -- Daily contact with patient to assess and evaluate symptoms and progress in treatment             -- Patient's case to be discussed in multi-disciplinary team meeting             -- Observation Level : q15 minute checks             -- Vital signs:  q12 hours             -- Precautions: suicide, elopement, and assault  Discharge Planning:              -- Social work and case management to assist with discharge planning and identification of hospital follow-up needs prior to discharge             -- Estimated LOS: 3-7 more days             -- Discharge Concerns: Need to establish a safety plan; Medication compliance and effectiveness             --  Discharge Goals: Return home with outpatient referrals for mental health follow-up including medication management/psychotherapy   Marsa GORMAN Rosser, DO 03/07/2024, 11:16 AM

## 2024-03-07 NOTE — Progress Notes (Signed)
 Collateral contact - re:  Electroconvulsive therapy (ECT)   [facility] [phone]  [address] [place], [place] [address]   3:00 PM - CSW called and was transferred to EEG (part of Neuro dept)  [phone]. No answer, LVM and call back number.  [facility] [phone] [address], [place] [address]  1:10 PM - CSW called to ask if availability for beds, admissions stated offices who had this information were closed for holiday  [facility] [phone] [facility] [address] [place], [place], [place] [address]  1:05 PM - CSW called to ask if availability for beds, admissions stated offices who had this information were closed for holiday  [facility] [address] [place], [place] [address] Phone:  [phone]  1:15 PM - CSW answered phone call from [facility] stating there are no beds currently available for ECT, suggested to call back tomorrow   Louetta Lame, LCSW-A 03/07/24

## 2024-03-07 NOTE — Group Note (Signed)
 LCSW Group Therapy Note   Group Date: 03/07/2024 Start Time: 1300 End Time: 1400   Participation:  did not attend  Type of Therapy:  Group Therapy  Topic:  Shining from Within:  Confidence and Self-Love Journey  Objective:  To support participants in developing confidence and self-love through self-awareness, self-compassion, and practical skills that nurture personal growth.   Group Goals Encourage self-reflection and self-acceptance by identifying personal strengths and achievements. Teach skills to challenge negative self-talk and replace it with supportive, truthful self-talk. Foster resilience and self-worth through [employer], gratitude, and self-care practices.   Summary:  This group explores the connection between confidence and self-love by guiding participants through reflection, mindset shifts, and practical tools like affirmations, strength recognition, and goal-setting. Activities are designed to promote self-compassion, build emotional resilience, and normalize the slow, patient journey of inner growth.   Therapeutic Modalities Used Cognitive Behavioral Therapy (CBT): Challenging and reframing unhelpful self-talk. Motivational Interviewing (MI): Encouraging small, achievable goals. Elements of Dialectical Behavioral Therapist (DBT):  Mindfulness and Self-Compassion: Promoting present-moment awareness and kindness toward self.   Breyson Kelm O Hermina Barnard, LCSWA 03/07/2024  7:13 PM

## 2024-03-07 NOTE — Group Note (Signed)
 Recreation Therapy Group Note   Group Topic:Leisure Education  Group Date: 03/07/2024 Start Time: 1025 End Time: 1055 Facilitators: Demauri Advincula-McCall, LRT,CTRS Location: [address] Dayroom   Group Topic: Leisure Education   Goal Area(s) Addresses:  Patient will successfully identify positive leisure and recreation activities.  Patient will acknowledge benefits of participation in healthy leisure activities post discharge.  Patient will actively work with peers toward a shared goal.   Behavioral Response:    Intervention: Competitive Group Game   Activity: Guess the [employer]. Patients were to use the spinner to land on a category ([address], Hip Hop, Indie, Dance, R&B and Pop). Whatever category the patient landed on, LRT would read the lyric and patient had to guess the missing word from the lyric. It patient didn't guess the correct answer, everyone else got a chance to steal the point. Whoever guessed the correct answer, got the point. The person with the most cards at the end of the game was the winner.   Education:  Teacher, English as a foreign language, Stress Management, Discharge Planning  Education Outcome: Acknowledges education/In group clarification offered/Needs additional education   Affect/Mood: N/A   Participation Level: Did not attend    Clinical Observations/Individualized Feedback:     Plan: Continue to engage patient in RT group sessions 2-3x/week.   Selah Zelman-McCall, LRT,CTRS 03/07/2024 12:44 PM

## 2024-03-07 NOTE — Plan of Care (Signed)
  Problem: Education: Goal: Knowledge of [facility] Education information/materials will improve Outcome: Progressing Goal: Emotional status will improve Outcome: Progressing Goal: Mental status will improve Outcome: Progressing Goal: Verbalization of understanding the information provided will improve Outcome: Progressing   Problem: Activity: Goal: Interest or engagement in activities will improve Outcome: Progressing Goal: Sleeping patterns will improve Outcome: Progressing   Problem: Coping: Goal: Ability to verbalize frustrations and anger appropriately will improve Outcome: Progressing Goal: Ability to demonstrate self-control will improve Outcome: Progressing   Problem: Health Behavior/Discharge Planning: Goal: Identification of resources available to assist in meeting health care needs will improve Outcome: Progressing Goal: Compliance with treatment plan for underlying cause of condition will improve Outcome: Progressing   Problem: Physical Regulation: Goal: Ability to maintain clinical measurements within normal limits will improve Outcome: Progressing   Problem: Safety: Goal: Periods of time without injury will increase Outcome: Progressing   Problem: Education: Goal: Knowledge of [place] [facility] Education information/materials will improve Outcome: Progressing Goal: Emotional status will improve Outcome: Progressing Goal: Mental status will improve Outcome: Progressing Goal: Verbalization of understanding the information provided will improve Outcome: Progressing   Problem: Activity: Goal: Interest or engagement in activities will improve Outcome: Progressing Goal: Sleeping patterns will improve Outcome: Progressing   Problem: Coping: Goal: Ability to verbalize frustrations and anger appropriately will improve Outcome: Progressing Goal: Ability to demonstrate self-control will improve Outcome: Progressing   Problem: Health  Behavior/Discharge Planning: Goal: Identification of resources available to assist in meeting health care needs will improve Outcome: Progressing Goal: Compliance with treatment plan for underlying cause of condition will improve Outcome: Progressing   Problem: Physical Regulation: Goal: Ability to maintain clinical measurements within normal limits will improve Outcome: Progressing   Problem: Safety: Goal: Periods of time without injury will increase Outcome: Progressing   Problem: Activity: Goal: Will identify at least one activity in which they can participate Outcome: Progressing   Problem: Coping: Goal: Ability to identify and develop effective coping behavior will improve Outcome: Progressing Goal: Ability to interact with others will improve Outcome: Progressing Goal: Demonstration of participation in decision-making regarding own care will improve Outcome: Progressing Goal: Ability to use eye contact when communicating with others will improve Outcome: Progressing   Problem: Health Behavior/Discharge Planning: Goal: Identification of resources available to assist in meeting health care needs will improve Outcome: Progressing   Problem: Self-Concept: Goal: Will verbalize positive feelings about self Outcome: Progressing   Problem: Activity: Goal: Will verbalize the importance of balancing activity with adequate rest periods Outcome: Progressing   Problem: Education: Goal: Will be free of psychotic symptoms Outcome: Progressing Goal: Knowledge of the prescribed therapeutic regimen will improve Outcome: Progressing   Problem: Coping: Goal: Coping ability will improve Outcome: Progressing Goal: Will verbalize feelings Outcome: Progressing   Problem: Health Behavior/Discharge Planning: Goal: Compliance with prescribed medication regimen will improve Outcome: Progressing   Problem: Nutritional: Goal: Ability to achieve adequate nutritional intake will  improve Outcome: Progressing   Problem: Role Relationship: Goal: Ability to communicate needs accurately will improve Outcome: Progressing Goal: Ability to interact with others will improve Outcome: Progressing   Problem: Safety: Goal: Ability to redirect hostility and anger into socially appropriate behaviors will improve Outcome: Progressing Goal: Ability to remain free from injury will improve Outcome: Progressing   Problem: Self-Care: Goal: Ability to participate in self-care as condition permits will improve Outcome: Progressing   Problem: Self-Concept: Goal: Will verbalize positive feelings about self Outcome: Progressing

## 2024-03-07 NOTE — Progress Notes (Incomplete)
(  Sleep Hours) - (Any PRNs that were needed, meds refused, or side effects to meds)- maalox (Any disturbances and when (visitation, over night)- none (Concerns raised by the patient)- none (SI/HI/AVH)- denies all

## 2024-03-07 NOTE — Progress Notes (Addendum)
 Collateral contact - re:  Electroconvulsive therapy (ECT)    [facility] [address] [place], [place] [address] Phone:  [phone]  Fax: [phone]   11:00 AM - CSW faxed progress notes and recent labs.       [facility] Electroconvulsive therapy (ECT) [address] [place], [place], [place] [address] ECT line: [phone], x [phone] (referrals) Fax: [phone]  CSW faxed the documents.  It was confirmed that they received the fax.  If we have a bed, I will call you back.   [facility] [address] [place], [place] [address] [phone]   CSW was advised to call social worker who would handle that [phone]. CSW left a voicemail.   Alejah Aristizabal, LCSWA 03/07/2024

## 2024-03-07 NOTE — B[facility] Group Notes (Signed)
 Adult Psychoeducational Group Note  Date:  03/07/2024 Time:  8:40 PM  Group Topic/Focus:  Wrap-Up Group:   The focus of this group is to help patients review their daily goal of treatment and discuss progress on daily workbooks.  Participation Level:  Active  Participation Quality:  Appropriate  Affect:  Appropriate  Cognitive:  Appropriate  Insight: Appropriate  Engagement in Group:  Engaged  Modes of Intervention:  Discussion  Additional Comments: Pt attended group.  Drue Pouch 03/07/2024, 8:40 PM

## 2024-03-07 NOTE — Plan of Care (Signed)
   Problem: Education: Goal: Mental status will improve Outcome: Progressing Goal: Verbalization of understanding the information provided will improve Outcome: Progressing   Problem: Activity: Goal: Interest or engagement in activities will improve Outcome: Progressing

## 2024-03-07 NOTE — Plan of Care (Signed)
  Problem: Education: Goal: Emotional status will improve Outcome: Progressing   Problem: Activity: Goal: Interest or engagement in activities will improve Outcome: Progressing Goal: Sleeping patterns will improve Outcome: Progressing

## 2024-03-08 ENCOUNTER — Ambulatory Visit (INDEPENDENT_AMBULATORY_CARE_PROVIDER_SITE_OTHER): Admitting: Gastroenterology

## 2024-03-08 ENCOUNTER — Encounter (INDEPENDENT_AMBULATORY_CARE_PROVIDER_SITE_OTHER): Payer: Self-pay | Admitting: Gastroenterology

## 2024-03-08 DIAGNOSIS — F312 Bipolar disorder, current episode manic severe with psychotic features: Secondary | ICD-10-CM | POA: Diagnosis not present

## 2024-03-08 NOTE — Progress Notes (Signed)
 Allison Bolton  Type of Note:  IVC Court Hearing  CSW brought the computer to patient's room but she was asleep.  CSW Kamalani Mastro wasa present for virtual IVC court hearing this afternoon.  [name] granted [facility] additional days of IVC.   Signed: Viggo Perko, LCSWA 03/08/2024, 3:00 PM

## 2024-03-08 NOTE — Progress Notes (Signed)
   03/08/24 2030  Psych Admission Type (Psych Patients Only)  Admission Status Voluntary  Psychosocial Assessment  Patient Complaints Irritability  Eye Contact Poor  Facial Expression Anxious  Affect Preoccupied  Speech Soft  Interaction Minimal  Motor Activity Other (Comment)  Appearance/Hygiene Disheveled;In hospital gown  Behavior Characteristics Cooperative  Mood Anxious  Aggressive Behavior  Effect No apparent injury  Thought Process  Coherency Disorganized  Content Preoccupation  Delusions None reported or observed  Perception Derealization  Hallucination None reported or observed  Judgment Poor  Confusion None  Danger to Self  Current suicidal ideation? Denies  Danger to Others  Danger to Others None reported or observed

## 2024-03-08 NOTE — Progress Notes (Signed)
(  Sleep Hours) -14.75 (Any PRNs that were needed, meds refused, or side effects to meds)- maalox and Zofran  (Any disturbances and when (visitation, over night)-none (Concerns raised by the patient)- none (SI/HI/AVH)-Denied

## 2024-03-08 NOTE — Progress Notes (Signed)
 [facility] MD Progress Note  03/08/2024 9:22 AM Allison Bolton  MRN:  [id number]  Principal Problem: Bipolar affective disorder, current episode manic with psychotic symptoms (HCC) Diagnosis: Principal Problem:   Bipolar affective disorder, current episode manic with psychotic symptoms (HCC)   Total Time spent with patient:  I personally spent 35 minutes on the unit in direct patient care. The direct patient care time included face-to-face time with the patient, reviewing the patient's chart, communicating with other professionals, and coordinating care.   Identifying Information and brief psychiatric history:  Allison Bolton is a 53 yr old female with working psychiatric diagnoses of bipolar 1 disorder and a history of prior documented substance-induced psychosis. She does have a history of prior psychiatric hospitalizations with substance use driving psychotic symptoms, however on this admission has presented with clear manic symptoms that have not cleared despite time and medications. She has on prior suicide Attempt (2007), and Prior Psychiatric Hospitalizations (last- [facility] 01/2024).  On this admission she presented to [facility] ED on 02/17/24  with a chief complaint of anxiety. In the ED, she was found to be acutely psychotic with tangential speech, agitation, and delusional thought content. Patient was admitted involuntarily to [facility] on 8/14. Since admission the patient has been notably unresponsive to antipsychotic medications - remaining acutely psychotic and plan has been for transfer to facility that provides ECT for treatment of refractory mania with psychosis.    Interval events: Patient was documented to have poor eye contact, presents as disheveled and proccupied and disorganized in thoughts and behaviors. Staff are reporting poor reality testing, although largely cooperative with medications and care. Slept 14.75 hrs overnight, denying all concerns. Received zofran  and maalox, no  behavioral PRNs required.   Interview today:  On interview today patient reports she is fine. Responds with few one-word responses, mostly yes/no and does not volunteer information. Denies all concerns except to ask to leave the hospital. We discussed plan to pursue ECT and patient noted that she had never had this procedure before. Explained plan for transfer to a hospital that can accommodate this. No SI, HI or AVH reported      Past Medical History:  Past Medical History:  Diagnosis Date   Anxiety    Arthritis    Asthma    Bipolar 1 disorder (HCC)    Diverticulosis    Dysrhythmia    sts I have heart palpitations   Fibromyalgia    H/O hiatal hernia    4   Headache(784.0)    High blood pressure    Meniere's disease    S/P colonoscopy    Dr. Golda 2010: few small diverticula at sigmoid. otherwise normal.    Shortness of breath     Past Surgical History:  Procedure Laterality Date   ABDOMINAL HYSTERECTOMY     APPENDECTOMY     CESAREAN SECTION     CHOLECYSTECTOMY     COLONOSCOPY N/A 12/25/2023   Procedure: COLONOSCOPY;  Surgeon: Eartha Angelia Sieving, MD;  Location: [facility];  Service: Gastroenterology;  Laterality: N/A;  11:30am, asa 1   exploratory laparoscopy     FOOT SURGERY     HEMORRHOID SURGERY     LAPAROSCOPIC APPENDECTOMY  02/19/2011   Procedure: APPENDECTOMY LAPAROSCOPIC;  Surgeon: Oneil DELENA Budge;  Location: [facility] ORS;  Service: General;  Laterality: N/A;   LAPAROSCOPY  02/19/2011   Procedure: LAPAROSCOPY DIAGNOSTIC;  Surgeon: Oneil DELENA Budge;  Location: [facility] ORS;  Service: General;  Laterality: N/A;  left ovarian removal     multiple hernia repairs     Right ovarian removal     TONSILLECTOMY     TONSILLECTOMY AND ADENOIDECTOMY     tubes in ears     UMBILICAL HERNIA REPAIR  Dec 2011   Dr. Mavis   wisdom teeth removal     Family History:  Family History  Problem Relation Age of Onset   Thyroid  disease Mother    Colon cancer Father    Breast  cancer Maternal Aunt    Colon cancer Brother    Colon cancer Other        paternal and maternal grandfather   Meniere's disease Other    Anesthesia problems Neg Hx    Hypotension Neg Hx    Malignant hyperthermia Neg Hx    Pseudochol deficiency Neg Hx    Family Psychiatric  History:  None Reported   Social History:  Social History   Substance and Sexual Activity  Alcohol Use No   Comment: not since June 2012     Social History   Substance and Sexual Activity  Drug Use Yes   Types: Marijuana   Comment: patient denies any-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    Social History   Socioeconomic History   Marital status: Legally Separated    Spouse name: Not on file   Number of children: Not on file   Years of education: Not on file   Highest education level: Not on file  Occupational History   Not on file  Tobacco Use   Smoking status: Every Day    Current packs/day: 0.00    Average packs/day: 0.5 packs/day for 37.3 years (18.7 ttl pk-yrs)    Types: Cigarettes    Start date: 07/07/1981    Last attempt to quit: 11/05/2018    Years since quitting: 5.3   Smokeless tobacco: Never  Vaping Use   Vaping status: Never Used  Substance and Sexual Activity   Alcohol use: No    Comment: not since June 2012   Drug use: Yes    Types: Marijuana    Comment: patient denies any-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   Sexual activity: Never    Birth control/protection: Surgical  Other Topics Concern   Not on file  Social History Narrative   Not on file   Social Drivers of Health   Financial Resource Strain: Not on file  Food Insecurity: Food Insecurity Present (02/18/2024)   Hunger Vital Sign    Worried About Running Out of Food in the Last Year: Sometimes true    Ran Out of Food in the Last Year: Sometimes true  Transportation Needs: Unmet Transportation Needs (02/18/2024)   PRAPARE - Administrator, Civil Service (Medical): Yes    Lack of Transportation  (Non-Medical): Yes  Physical Activity: Not on file  Stress: Not on file  Social Connections: Unknown (10/14/2021)   Received from [facility]   Social Connections    Do your friends and family support you?: Not on file    What agencies support you?: Not on file    Current Medications: Current Facility-Administered Medications  Medication Dose Route Frequency Provider Last Rate Last Admin   acetaminophen  (TYLENOL ) tablet 650 mg  650 mg Oral Q6H PRN White, Patrice L, NP   650 mg at 03/05/24 1008   alum & mag hydroxide-simeth (MAALOX/MYLANTA) 200-200-20 MG/5ML suspension 30 mL  30 mL Oral Q4H  PRN Teresa Jes L, NP   30 mL at 03/07/24 2204   benztropine  (COGENTIN ) tablet 1 mg  1 mg Oral BID White, Patrice L, NP   1 mg at 03/07/24 1626   divalproex  (DEPAKOTE ) DR tablet 750 mg  750 mg Oral Q12H Ji, Andrew, MD   750 mg at 03/07/24 2015   haloperidol  (HALDOL ) tablet 10 mg  10 mg Oral Q12H Ji, Andrew, MD   10 mg at 03/07/24 2014   hydrOXYzine  (ATARAX ) tablet 25 mg  25 mg Oral TID PRN White, Patrice L, NP   25 mg at 03/02/24 1627   LORazepam  (ATIVAN ) tablet 1 mg  1 mg Oral Q12H Lynnette Barter, MD   1 mg at 03/07/24 2014   magnesium  hydroxide (MILK OF MAGNESIA) suspension 30 mL  30 mL Oral Daily PRN White, Patrice L, NP       nicotine  polacrilex (NICORETTE ) gum 2 mg  2 mg Oral PRN Trudy Carwin, NP   2 mg at 03/06/24 9185   OLANZapine  (ZYPREXA ) injection 10 mg  10 mg Intramuscular TID PRN White, Patrice L, NP       OLANZapine  (ZYPREXA ) injection 5 mg  5 mg Intramuscular TID PRN White, Patrice L, NP       OLANZapine  zydis (ZYPREXA ) disintegrating tablet 10 mg  10 mg Oral Daily Stevens, Briana N, MD   10 mg at 03/07/24 0825   OLANZapine  zydis (ZYPREXA ) disintegrating tablet 15 mg  15 mg Oral QHS Stevens, Briana N, MD   15 mg at 03/07/24 2110   OLANZapine  zydis (ZYPREXA ) disintegrating tablet 5 mg  5 mg Oral TID PRN Teresa Jes L, NP   5 mg at 02/26/24 2050   ondansetron  (ZOFRAN -ODT)  disintegrating tablet 4 mg  4 mg Oral Q8H PRN Lynnette Barter, MD   4 mg at 03/07/24 2205   traZODone  (DESYREL ) tablet 50 mg  50 mg Oral QHS White, Patrice L, NP   50 mg at 03/07/24 2110    Lab Results:  Results for orders placed or performed during the hospital encounter of 02/18/24 (from the past 48 hours)  Comprehensive metabolic panel with GFR     Status: Abnormal   Collection Time: 03/07/24  6:27 AM  Result Value Ref Range   Sodium 142 135 - 145 mmol/L   Potassium 4.2 3.5 - 5.1 mmol/L   Chloride 107 98 - 111 mmol/L   CO2 22 22 - 32 mmol/L   Glucose, Bld 83 70 - 99 mg/dL    Comment: Glucose reference range applies only to samples taken after fasting for at least 8 hours.   BUN 14 6 - 20 mg/dL   Creatinine, Ser 9.34 0.44 - 1.00 mg/dL   Calcium 9.4 8.9 - 89.6 mg/dL   Total Protein 6.1 (L) 6.5 - 8.1 g/dL   Albumin 3.9 3.5 - 5.0 g/dL   AST 12 (L) 15 - 41 U/L   ALT 10 0 - 44 U/L   Alkaline Phosphatase 58 38 - 126 U/L   Total Bilirubin 0.4 0.0 - 1.2 mg/dL   GFR, Estimated >39 >39 mL/min    Comment: (NOTE) Calculated using the CKD-EPI Creatinine Equation (2021)    Anion gap 13 5 - 15    Comment: Performed at [facility], [address], [place], [place] [address]  CBC with Differential/Platelet     Status: Abnormal   Collection Time: 03/07/24  6:27 AM  Result Value Ref Range   WBC 8.1 4.0 - 10.5  K/uL   RBC 5.42 (H) 3.87 - 5.11 MIL/uL   Hemoglobin 15.4 (H) 12.0 - 15.0 g/dL   HCT 53.0 (H) 63.9 - 53.9 %   MCV 86.5 80.0 - 100.0 fL   MCH 28.4 26.0 - 34.0 pg   MCHC 32.8 30.0 - 36.0 g/dL   RDW 87.4 88.4 - 84.4 %   Platelets 233 150 - 400 K/uL   nRBC 0.0 0.0 - 0.2 %   Neutrophils Relative % 37 %   Neutro Abs 3.0 1.7 - 7.7 K/uL   Lymphocytes Relative 47 %   Lymphs Abs 3.8 0.7 - 4.0 K/uL   Monocytes Relative 10 %   Monocytes Absolute 0.8 0.1 - 1.0 K/uL   Eosinophils Relative 5 %   Eosinophils Absolute 0.4 0.0 - 0.5 K/uL   Basophils Relative 1 %   Basophils  Absolute 0.1 0.0 - 0.1 K/uL   Immature Granulocytes 0 %   Abs Immature Granulocytes 0.03 0.00 - 0.07 K/uL    Comment: Performed at [facility], [address] [place], [place] [address]  Valproic  acid level     Status: None   Collection Time: 03/07/24  6:27 AM  Result Value Ref Range   Valproic  Acid Lvl 87 50 - 100 ug/mL    Comment: Performed at [facility], [address] [place], [place] [address]    Blood Alcohol level:  Lab Results  Component Value Date   [facility] <15 02/17/2024   ETH <15 01/14/2024    Metabolic Disorder Labs: Lab Results  Component Value Date   HGBA1C 5.1 01/18/2024   MPG 99.67 01/18/2024   MPG 105.41 05/11/2019   Lab Results  Component Value Date   PROLACTIN 24.1 (H) 05/11/2019   Lab Results  Component Value Date   CHOL 154 01/18/2024   TRIG 180 (H) 01/18/2024   HDL 49 01/18/2024   CHOLHDL 3.1 01/18/2024   VLDL 36 01/18/2024   LDLCALC 69 01/18/2024   LDLCALC 82 05/11/2019   Mental Status exam: Appearance: white female of slightly elevated BMI, disheveled and seen under blankets, older than stated age  Eye contact: fair - does make some eye contact at times  Attitude towards examiner withdrawn, minimally responsive  Psychomotor: no agitation or retardation  Speech: reduced amount, soft, infrequent one-word responses  Language: simplistic  Mood: fine Affect: restricted in range, appeared fearful  Thought content: denying SI, HI, not overtly expressing delusional content  Thought Process: concrete, disorganized based on limited exam  Perception: denying AVH, not overtly RTIS but did appear internally preoccupied Insight: poor  Judgement: poor   Orientation: to self  Attention/Concentration: limited due to psychosis  Memory/Cognition: not formally assessed   Fund of Knowledge: Average    Musculoskeletal: Strength & Muscle Tone: within normal limits Gait & Station: normal Patient leans:  N/A  Psy  Physical Exam: Physical Exam HENT:     Head: Normocephalic and atraumatic.  Pulmonary:     Effort: Pulmonary effort is normal.  Musculoskeletal:        General: Normal range of motion.  Neurological:     General: No focal deficit present.    ROS Blood pressure 112/72, pulse (!) 117, temperature (!) 97.2 F (36.2 C), temperature source Oral, resp. rate 16, height 5' 6 (1.676 m), weight 80.7 kg, SpO2 97%. Body mass index is 28.71 kg/m.   Treatment Plan Summary: Daily contact with patient to assess and evaluate symptoms and progress in treatment and Medication management  Assessment AVEREE HARB is a 53 yr old female who presented on 02/17/24 to [facility] ED with a chief complaint of anxiety. In the ED, she was found to be acutely psychotic with tangential speech, agitation, and delusional thought content. Patient was admitted involuntarily to [facility] on 8/14.  Although substance-induced psychosis has previously been documented, she additional presents with prior reported manic episodes. On this admission, she has presented with agitation and increased rate of speech, initially had significantly poor sleep and psychotic symptoms, disorganization of thoughts, behavior, concern for AVH, have been significant and unresponsive to antipsychotic medications. Given severity of symptoms, working diagnosis is a bipolar 1 diagnosis, currently considered to be in manic episode refractory to treatment. She has not been able to attend to ADLs since admission. Numerous antipsychotic medications and mood stabilizers have been tried as noted below but she has remained with significant psychotic symptoms. Due to inability to care for self and poor response to medications we have been pursuing transfer to a facility that offers ECT.   Staff have continued to documented disorganization in thoughts/behaviors, internal preoccupation, limited ability to engage in care and inability to perform ADLs.  Has been medication compliant. Today she remained withdrawn and internally proccupied, notably disheveled and with reduced amount of speech. Will continue to work towards transfer to a facility that offers ECT.    DSM-5 diagnoses: Bipolar 1 Disorder, current episode manic, severe, with psychotic features  History of substance induced psychotic disorder   Plan:  Psychiatric medications:  -Continue Haldol  10 mg q12 for psychosis and mood stability -Continue Zyprexa  10 mg AM & 15 mg QHS for psychosis and mood stability -Continue Depakote  DR 750 mg q12 for mood stability -Continue Ativan  1 mg q12 for agitation  -Continue Cogentin  1 mg BID for Drug Induced EPS -Continue Agitation Protocol: Zyprexa    Nicotine  Dependence: -Continue Nicotine  Gum 2 mg PRN  Additional PRNs -Continue Zofran  4 mg q8 PRN nausea/vomiting -Continue PRN's: Tylenol , Maalox, Atarax , Milk of Magnesia, Trazodone    --  The risks/benefits/side-effects/alternatives to medications were discussed in detail with the patient and time was given for questions. The patient consents to medication trials.                -- Metabolic profile and EKG monitoring obtained while on an atypical antipsychotic (BMI: 28.73 Lipid Panel: WNL except Trig: 180 HbgA1c: 5.1)              -- Encouraged patient to participate in unit milieu and in scheduled group therapies              -- Short Term Goals: Ability to identify changes in lifestyle to reduce recurrence of condition will improve, Ability to verbalize feelings will improve, Ability to disclose and discuss suicidal ideas, Ability to demonstrate self-control will improve, Ability to identify and develop effective coping behaviors will improve, Ability to maintain clinical measurements within normal limits will improve, Compliance with prescribed medications will improve, and Ability to identify triggers associated with substance abuse/mental health issues will improve             -- Long  Term Goals: Improvement in symptoms so as ready for discharge   Safety and Monitoring:             -- Involuntary admission to inpatient psychiatric unit for safety, stabilization and treatment             -- Daily contact with patient to assess and evaluate symptoms and progress  in treatment             -- Patient's case to be discussed in multi-disciplinary team meeting             -- Observation Level : q15 minute checks             -- Vital signs:  q12 hours             -- Precautions: suicide, elopement, and assault  Discharge Planning:              -- Appreciate SW assistance with transfer to a facility that provides ECT given the patient requires a higher level of care.   Allison LOISE Arts, MD 03/08/2024, 9:22 AM

## 2024-03-08 NOTE — Progress Notes (Signed)
 Tour of Duty:  Allison JINNY Angle, RN, 03/08/24, Tour of Duty: 0700-1900  SI/HI/AVH: Denies  Self-Reported   Mood: Positive  Anxiety: Denies Depression: Denies Irritability: Denies  Broset  Violence Prevention Guidelines *See Row Information*: Small Violence Risk interventions implemented   LBM  Last BM Date :  (UTA)   Pain: not present  Patient Refusals (including Rx): No  Shift Summary: Patient observed to be redirectable, anxious, and mildly confused on unit. Patient able to make needs known. Patient observed to engage appropriately with staff and peers. Patient taking medications as prescribed. This shift, no PRN medication requested or required. No observed or reported side effects to medication. No observed or reported agitation, aggression, or other acute emotional distress. No observed or reported physical abnormalities or concerns.  Last Vitals  Vitals Weight: 80.7 kg Temp: (!) 97.2 F (36.2 C) Temp Source: Oral Pulse Rate: 84 Resp: 16 BP: 94/72 Patient Position: (not recorded)  Admission Type  Psych Admission Type (Psych Patients Only) Admission Status: Voluntary Date 72 hour document signed : (not recorded) Time 72 hour document signed : (not recorded) Provider Notified (First and Last Name) (see details for LINK to note): (not recorded)   Psychosocial Assessment  Psychosocial Assessment Patient Complaints: None Eye Contact: Poor Facial Expression: Anxious Affect: Preoccupied Speech: Slurred, Soft Interaction: Childlike, Minimal Motor Activity: Restless Appearance/Hygiene: Body odor, Disheveled Behavior Characteristics: Cooperative Mood: Anxious   Aggressive Behavior  Targets: (not recorded)   Thought Process  Thought Process Coherency: Disorganized, Tangential Content: Preoccupation Delusions: Paranoid Perception: Derealization Hallucination: None reported or observed Judgment: Poor Confusion: Mild  Danger to Self/Others  Danger to  Self Current suicidal ideation?: Denies Description of Suicide Plan: (not recorded) Self-Injurious Behavior: (not recorded) Agreement Not to Harm Self: (not recorded) Description of Agreement: (not recorded) Danger to Others: None reported or observed

## 2024-03-08 NOTE — Progress Notes (Addendum)
 Collateral contact - re:  Electroconvulsive therapy (ECT)     [facility]  [address] [place], [place] [address] Phone:  [phone] Phone:  [phone]  They don't offer ECT at this location anymore.  CSW was advised to fax documents to their [facility] Bed Management fax: [phone].  CSW was advised there was no call back number, but they would call CSW if bed was available.   1:15 PM - CSW faxed the documents.   [facility] [address] [place], [place] [address] Phone:  [phone]  Fax: [phone]   They don't have any beds available at this time.     [facility] [phone] [address] [place], [place] [address]  CSW left a voicemail.   [facility] Electroconvulsive therapy (ECT) [address] [place], [place], [place] [address] ECT line: [phone], x [phone] (referrals) Fax: [phone]  ECT line: [phone], x [phone].  They are back up 2 weeks, however they don't have a waiting list.  The woman said she will inform the director about patient.  CSW was advised to call [phone], however she was unable to reach anyone (they require ID number).    Medha Pippen, LCSWA 03/08/2024

## 2024-03-08 NOTE — Group Note (Signed)
 Recreation Therapy Group Note   Group Topic:Coping Skills  Group Date: 03/08/2024 Start Time: 1035 End Time: 1100 Facilitators: Sohana Austell-McCall, LRT,CTRS Location: [address] Dayroom   Group Topic: Coping Skills   Goal Area(s) Addresses: Patient will define what a coping skill is. Patient will create a list of healthy coping skills beginning with each letter of the alphabet. Patient will successfully identify positive coping skills they can use post d/c.  Patient will acknowledge benefit(s) of using learned coping skills post d/c.   Behavioral Response:    Intervention: Worksheet   Activity: Coping A to Z. Patient asked to identify what a coping skill is and when they use them. Patients with Clinical research associate discussed healthy versus unhealthy coping skills. Next patients were given a blank worksheet titled Coping Skills A-Z. Patients were instructed to come up with at least one positive coping skill per letter of the alphabet.Patients were given 15 minutes to brainstorm before ideas were presented to the large group. Patients and LRT debriefed on the importance of coping skill selection based on situation and back-up plans when a skill tried is not effective. At the end of group, patients were given an handout of alphabetized strategies to keep for future reference.   Education: Pharmacologist, Scientist, physiological, Discharge Planning.    Education Outcome: Acknowledges education/Verbalizes understanding/In group clarification offered/Additional education needed    Clinical Observations/Individualized Feedback: Due to STAR on unit, group was unable to occur.    Plan: Continue to engage patient in RT group sessions 2-3x/week.   Zoltan Genest-McCall, LRT,CTRS 03/08/2024 1:11 PM

## 2024-03-08 NOTE — Plan of Care (Signed)
   Problem: Education: Goal: Emotional status will improve Outcome: Progressing Goal: Mental status will improve Outcome: Progressing

## 2024-03-09 ENCOUNTER — Encounter (HOSPITAL_COMMUNITY): Payer: Self-pay

## 2024-03-09 DIAGNOSIS — F312 Bipolar disorder, current episode manic severe with psychotic features: Secondary | ICD-10-CM | POA: Diagnosis not present

## 2024-03-09 NOTE — Progress Notes (Signed)
 Collateral contact - re:  Electroconvulsive therapy (ECT)      [facility]  [address] [place], [place] [address] Phone:  [phone] Fax:  [phone]  CSW left a voicemail inquiring about bed availability (fax was sent yesterday).     [facility] [address] [place], [place] [address] Phone:  [phone]  Fax: [phone]   The receptionist said she will page the provider and find out if there are any beds available.      [facility] [address] [facility] [place] [address] Phone:  [phone] - main Phone:  [phone] - ECT Phone:  [phone] - Behavioral Health  CSW was informed that they don't provide ECT therapy.      [facility] Electroconvulsive therapy (ECT) [address] [place], [place], [place] [address] ECT line: [phone], x [phone] (referrals) Fax: [phone]   CSW left a voicemail.   Roston Grunewald, LCSWA 03/09/2024

## 2024-03-09 NOTE — Progress Notes (Signed)
   03/09/24 1930  Psych Admission Type (Psych Patients Only)  Admission Status Voluntary  Psychosocial Assessment  Patient Complaints None  Eye Contact Poor  Facial Expression Flat  Affect Preoccupied  Speech Soft  Interaction Minimal  Motor Activity Other (Comment)  Appearance/Hygiene Disheveled;In hospital gown  Behavior Characteristics Cooperative  Mood Pleasant  Aggressive Behavior  Effect No apparent injury  Thought Process  Coherency Disorganized  Content Preoccupation  Delusions None reported or observed  Perception Derealization  Hallucination None reported or observed  Judgment Poor  Confusion None  Danger to Self  Current suicidal ideation? Denies  Danger to Others  Danger to Others None reported or observed

## 2024-03-09 NOTE — Group Note (Signed)
 Date:  03/09/2024 Time:  8:20 PM  Group Topic/Focus:  Wrap-Up Group:   The focus of this group is to help patients review their daily goal of treatment and discuss progress on daily workbooks.    Participation Level:  Did Not Attend  Allison Bolton 03/09/2024, 8:20 PM

## 2024-03-09 NOTE — Progress Notes (Signed)
   03/09/24 1243  Psych Admission Type (Psych Patients Only)  Admission Status Voluntary  Psychosocial Assessment  Patient Complaints None  Eye Contact Poor  Facial Expression Flat  Affect Preoccupied  Speech Soft  Interaction Minimal  Motor Activity Slow  Appearance/Hygiene Disheveled;Poor hygiene;In hospital gown  Behavior Characteristics Guarded  Mood Apprehensive  Thought Process  Coherency Disorganized  Content Preoccupation  Delusions None reported or observed  Perception Derealization  Hallucination None reported or observed  Judgment Poor  Confusion None  Danger to Self  Current suicidal ideation? Denies  Agreement Not to Harm Self Yes  Description of Agreement verbal  Danger to Others  Danger to Others None reported or observed

## 2024-03-09 NOTE — Group Note (Signed)
 Recreation Therapy Group Note   Group Topic:Problem Solving  Group Date: 03/09/2024 Start Time: 1015 End Time: 1055 Facilitators: Tel Hevia-McCall, LRT,CTRS Location: [address] Dayroom   Group Topic: Communication, Team Building, Problem Solving  Goal Area(s) Addresses:  Patient will effectively work with peer towards shared goal.  Patient will identify skills used to make activity successful.  Patient will identify how skills used during activity can be used to reach post d/c goals.   Behavioral Response:   Intervention: STEM Activity  Activity: Landing Pad. In teams of 3-5, patients were given 12 plastic drinking straws and an equal length of masking tape. Using the materials provided, patients were asked to build a landing pad to catch a golf ball dropped from approximately 5 feet in the air. All materials were required to be used by the team in their design. LRT facilitated post-activity discussion.  Education: Pharmacist, community, Scientist, physiological, Discharge Planning   Education Outcome: Acknowledges education/In group clarification offered/Needs additional education.    Affect/Mood: N/A   Participation Level: Did not attend    Clinical Observations/Individualized Feedback:      Plan: Continue to engage patient in RT group sessions 2-3x/week.   Shed Nixon-McCall, LRT,CTRS 03/09/2024 1:10 PM

## 2024-03-09 NOTE — Plan of Care (Signed)
  Problem: Education: Goal: Knowledge of [facility] Education information/materials will improve Outcome: Progressing Goal: Emotional status will improve Outcome: Progressing Goal: Mental status will improve Outcome: Progressing Goal: Verbalization of understanding the information provided will improve Outcome: Progressing   Problem: Activity: Goal: Interest or engagement in activities will improve Outcome: Progressing Goal: Sleeping patterns will improve Outcome: Progressing   Problem: Coping: Goal: Ability to verbalize frustrations and anger appropriately will improve Outcome: Progressing Goal: Ability to demonstrate self-control will improve Outcome: Progressing   Problem: Health Behavior/Discharge Planning: Goal: Identification of resources available to assist in meeting health care needs will improve Outcome: Progressing Goal: Compliance with treatment plan for underlying cause of condition will improve Outcome: Progressing   Problem: Physical Regulation: Goal: Ability to maintain clinical measurements within normal limits will improve Outcome: Progressing   Problem: Safety: Goal: Periods of time without injury will increase Outcome: Progressing   Problem: Education: Goal: Knowledge of [place] [facility] Education information/materials will improve Outcome: Progressing Goal: Emotional status will improve Outcome: Progressing Goal: Mental status will improve Outcome: Progressing Goal: Verbalization of understanding the information provided will improve Outcome: Progressing   Problem: Activity: Goal: Interest or engagement in activities will improve Outcome: Progressing Goal: Sleeping patterns will improve Outcome: Progressing   Problem: Coping: Goal: Ability to verbalize frustrations and anger appropriately will improve Outcome: Progressing Goal: Ability to demonstrate self-control will improve Outcome: Progressing   Problem: Health  Behavior/Discharge Planning: Goal: Identification of resources available to assist in meeting health care needs will improve Outcome: Progressing Goal: Compliance with treatment plan for underlying cause of condition will improve Outcome: Progressing   Problem: Physical Regulation: Goal: Ability to maintain clinical measurements within normal limits will improve Outcome: Progressing   Problem: Safety: Goal: Periods of time without injury will increase Outcome: Progressing   Problem: Activity: Goal: Will identify at least one activity in which they can participate Outcome: Progressing   Problem: Coping: Goal: Ability to identify and develop effective coping behavior will improve Outcome: Progressing Goal: Ability to interact with others will improve Outcome: Progressing Goal: Demonstration of participation in decision-making regarding own care will improve Outcome: Progressing Goal: Ability to use eye contact when communicating with others will improve Outcome: Progressing   Problem: Health Behavior/Discharge Planning: Goal: Identification of resources available to assist in meeting health care needs will improve Outcome: Progressing   Problem: Self-Concept: Goal: Will verbalize positive feelings about self Outcome: Progressing   Problem: Activity: Goal: Will verbalize the importance of balancing activity with adequate rest periods Outcome: Progressing   Problem: Education: Goal: Will be free of psychotic symptoms Outcome: Progressing Goal: Knowledge of the prescribed therapeutic regimen will improve Outcome: Progressing   Problem: Coping: Goal: Coping ability will improve Outcome: Progressing Goal: Will verbalize feelings Outcome: Progressing   Problem: Health Behavior/Discharge Planning: Goal: Compliance with prescribed medication regimen will improve Outcome: Progressing   Problem: Nutritional: Goal: Ability to achieve adequate nutritional intake will  improve Outcome: Progressing   Problem: Role Relationship: Goal: Ability to communicate needs accurately will improve Outcome: Progressing Goal: Ability to interact with others will improve Outcome: Progressing   Problem: Safety: Goal: Ability to redirect hostility and anger into socially appropriate behaviors will improve Outcome: Progressing Goal: Ability to remain free from injury will improve Outcome: Progressing   Problem: Self-Care: Goal: Ability to participate in self-care as condition permits will improve Outcome: Progressing   Problem: Self-Concept: Goal: Will verbalize positive feelings about self Outcome: Progressing
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
## 2024-03-09 NOTE — Progress Notes (Signed)
(  Sleep Hours) -6.75 (Any PRNs that were needed, meds refused, or side effects to meds)- Tylenol  (Any disturbances and when (visitation, over night)-none (Concerns raised by the patient)- none (SI/HI/AVH)-Denied

## 2024-03-09 NOTE — Progress Notes (Signed)
 [facility] MD Progress Note  03/09/2024 8:07 AM BEULA JOYNER  MRN:  [id number]  Principal Problem: Bipolar affective disorder, current episode manic with psychotic symptoms (HCC) Diagnosis: Principal Problem:   Bipolar affective disorder, current episode manic with psychotic symptoms (HCC)   Total Time spent with patient:  I personally spent 35 minutes on the unit in direct patient care. The direct patient care time included face-to-face time with the patient, reviewing the patient's chart, communicating with other professionals, and coordinating care.   Identifying Information and brief psychiatric history:  RUQAYYAH LUTE is a 53 yr old female with working psychiatric diagnoses of bipolar 1 disorder and a history of prior documented substance-induced psychosis. She does have a history of prior psychiatric hospitalizations with substance use driving psychotic symptoms, however on this admission has presented with clear manic symptoms that have not cleared despite time and medications. She has on prior suicide Attempt (2007), and Prior Psychiatric Hospitalizations (last- [facility] 01/2024).  On this admission she presented to [facility] ED on 02/17/24  with a chief complaint of anxiety. In the ED, she was found to be acutely psychotic with tangential speech, agitation, and delusional thought content. Patient was admitted involuntarily to [facility] on 8/14. Since admission the patient has been notably unresponsive to antipsychotic medications - remaining acutely psychotic and plan has been for transfer to facility that provides ECT for treatment of refractory mania with psychosis.    Interval events: Patient had court hearing yesterday, judge granted additional days under IVC. On united noted to present as mildly confused and anxious. Has been more appropriate with staff and taking medications as prescribed. Remains with poor eye contact and disorganized thoughts. Did not have any behavioral concerns  warranting PRNs. Denying SI, HI and AVH  Interview today:  On interview today patient reports she is fine. Barely rouses for interview, notes she is tired. Shakes her head or doesn't respond to additional questions      Past Medical History:  Past Medical History:  Diagnosis Date   Anxiety    Arthritis    Asthma    Bipolar 1 disorder (HCC)    Diverticulosis    Dysrhythmia    sts I have heart palpitations   Fibromyalgia    H/O hiatal hernia    4   Headache(784.0)    High blood pressure    Meniere's disease    S/P colonoscopy    Dr. Golda 2010: few small diverticula at sigmoid. otherwise normal.    Shortness of breath     Past Surgical History:  Procedure Laterality Date   ABDOMINAL HYSTERECTOMY     APPENDECTOMY     CESAREAN SECTION     CHOLECYSTECTOMY     COLONOSCOPY N/A 12/25/2023   Procedure: COLONOSCOPY;  Surgeon: Eartha Angelia Sieving, MD;  Location: [facility];  Service: Gastroenterology;  Laterality: N/A;  11:30am, asa 1   exploratory laparoscopy     FOOT SURGERY     HEMORRHOID SURGERY     LAPAROSCOPIC APPENDECTOMY  02/19/2011   Procedure: APPENDECTOMY LAPAROSCOPIC;  Surgeon: Oneil DELENA Budge;  Location: [facility] ORS;  Service: General;  Laterality: N/A;   LAPAROSCOPY  02/19/2011   Procedure: LAPAROSCOPY DIAGNOSTIC;  Surgeon: Oneil DELENA Budge;  Location: [facility] ORS;  Service: General;  Laterality: N/A;   left ovarian removal     multiple hernia repairs     Right ovarian removal     TONSILLECTOMY     TONSILLECTOMY AND ADENOIDECTOMY     tubes  in ears     UMBILICAL HERNIA REPAIR  Dec 2011   Dr. Mavis   wisdom teeth removal     Family History:  Family History  Problem Relation Age of Onset   Thyroid  disease Mother    Colon cancer Father    Breast cancer Maternal Aunt    Colon cancer Brother    Colon cancer Other        paternal and maternal grandfather   Meniere's disease Other    Anesthesia problems Neg Hx    Hypotension Neg Hx    Malignant hyperthermia  Neg Hx    Pseudochol deficiency Neg Hx    Family Psychiatric  History:  None Reported   Social History:  Social History   Substance and Sexual Activity  Alcohol Use No   Comment: not since June 2012     Social History   Substance and Sexual Activity  Drug Use Yes   Types: Marijuana   Comment: patient denies any-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    Social History   Socioeconomic History   Marital status: Legally Separated    Spouse name: Not on file   Number of children: Not on file   Years of education: Not on file   Highest education level: Not on file  Occupational History   Not on file  Tobacco Use   Smoking status: Every Day    Current packs/day: 0.00    Average packs/day: 0.5 packs/day for 37.3 years (18.7 ttl pk-yrs)    Types: Cigarettes    Start date: 07/07/1981    Last attempt to quit: 11/05/2018    Years since quitting: 5.3   Smokeless tobacco: Never  Vaping Use   Vaping status: Never Used  Substance and Sexual Activity   Alcohol use: No    Comment: not since June 2012   Drug use: Yes    Types: Marijuana    Comment: patient denies any-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   Sexual activity: Never    Birth control/protection: Surgical  Other Topics Concern   Not on file  Social History Narrative   Not on file   Social Drivers of Health   Financial Resource Strain: Not on file  Food Insecurity: Food Insecurity Present (02/18/2024)   Hunger Vital Sign    Worried About Running Out of Food in the Last Year: Sometimes true    Ran Out of Food in the Last Year: Sometimes true  Transportation Needs: Unmet Transportation Needs (02/18/2024)   PRAPARE - Administrator, Civil Service (Medical): Yes    Lack of Transportation (Non-Medical): Yes  Physical Activity: Not on file  Stress: Not on file  Social Connections: Unknown (10/14/2021)   Received from [facility]   Social Connections    Do your friends and family  support you?: Not on file    What agencies support you?: Not on file    Current Medications: Current Facility-Administered Medications  Medication Dose Route Frequency Provider Last Rate Last Admin   acetaminophen  (TYLENOL ) tablet 650 mg  650 mg Oral Q6H PRN White, Patrice L, NP   650 mg at 03/09/24 0149   alum & mag hydroxide-simeth (MAALOX/MYLANTA) 200-200-20 MG/5ML suspension 30 mL  30 mL Oral Q4H PRN White, Patrice L, NP   30 mL at 03/07/24 2204   benztropine  (COGENTIN ) tablet 1 mg  1 mg Oral BID White, Patrice L, NP   1 mg at 03/08/24  1643   divalproex  (DEPAKOTE ) DR tablet 750 mg  750 mg Oral Q12H Lynnette Barter, MD   750 mg at 03/08/24 1949   haloperidol  (HALDOL ) tablet 10 mg  10 mg Oral Q12H Ji, Andrew, MD   10 mg at 03/08/24 1949   hydrOXYzine  (ATARAX ) tablet 25 mg  25 mg Oral TID PRN White, Patrice L, NP   25 mg at 03/02/24 1627   LORazepam  (ATIVAN ) tablet 1 mg  1 mg Oral Q12H Lynnette Barter, MD   1 mg at 03/08/24 1949   magnesium  hydroxide (MILK OF MAGNESIA) suspension 30 mL  30 mL Oral Daily PRN White, Patrice L, NP       nicotine  polacrilex (NICORETTE ) gum 2 mg  2 mg Oral PRN Trudy Carwin, NP   2 mg at 03/06/24 9185   OLANZapine  (ZYPREXA ) injection 10 mg  10 mg Intramuscular TID PRN White, Patrice L, NP       OLANZapine  (ZYPREXA ) injection 5 mg  5 mg Intramuscular TID PRN White, Patrice L, NP       OLANZapine  zydis (ZYPREXA ) disintegrating tablet 10 mg  10 mg Oral Daily Stevens, Briana N, MD   10 mg at 03/08/24 9045   OLANZapine  zydis (ZYPREXA ) disintegrating tablet 15 mg  15 mg Oral QHS Mannie Ashley SAILOR, MD   15 mg at 03/08/24 2108   OLANZapine  zydis (ZYPREXA ) disintegrating tablet 5 mg  5 mg Oral TID PRN Teresa Jes L, NP   5 mg at 02/26/24 2050   ondansetron  (ZOFRAN -ODT) disintegrating tablet 4 mg  4 mg Oral Q8H PRN Lynnette Barter, MD   4 mg at 03/07/24 2205   traZODone  (DESYREL ) tablet 50 mg  50 mg Oral QHS White, Patrice L, NP   50 mg at 03/08/24 2108    Lab Results:  No results  found for this or any previous visit (from the past 48 hours).   Blood Alcohol level:  Lab Results  Component Value Date   [facility] <15 02/17/2024   ETH <15 01/14/2024    Metabolic Disorder Labs: Lab Results  Component Value Date   HGBA1C 5.1 01/18/2024   MPG 99.67 01/18/2024   MPG 105.41 05/11/2019   Lab Results  Component Value Date   PROLACTIN 24.1 (H) 05/11/2019   Lab Results  Component Value Date   CHOL 154 01/18/2024   TRIG 180 (H) 01/18/2024   HDL 49 01/18/2024   CHOLHDL 3.1 01/18/2024   VLDL 36 01/18/2024   LDLCALC 69 01/18/2024   LDLCALC 82 05/11/2019   Mental Status exam: Appearance: white female of slightly elevated BMI, disheveled and seen under blankets, older than stated age  Eye contact: poor - today keeps eyes closed throughout  Attitude towards examiner withdrawn, minimally responsive  Psychomotor: no agitation or retardation  Speech: reduced amount, soft, infrequent one-word responses  Language: simplistic  Mood: fine Affect: restricted in range, today was somnolent  Thought content: denying SI, HI, not overtly expressing delusional content  Thought Process: concrete, disorganized based on limited exam  Perception: denying AVH, not overtly RTIS  Insight: poor  Judgement: poor   Orientation: to self  Attention/Concentration: limited due to psychosis  Memory/Cognition: not formally assessed   Fund of Knowledge: Average    Musculoskeletal: Strength & Muscle Tone: within normal limits Gait & Station: normal Patient leans: N/A   Physical Exam HENT:     Head: Normocephalic and atraumatic.  Pulmonary:     Effort: Pulmonary effort is normal.  Musculoskeletal:  General: Normal range of motion.  Neurological:     General: No focal deficit present.    ROS Blood pressure 95/84, pulse 90, temperature (!) 97.2 F (36.2 C), temperature source Oral, resp. rate 16, height 5' 6 (1.676 m), weight 80.7 kg, SpO2 96%. Body mass index is 28.71  kg/m.   Treatment Plan Summary: Daily contact with patient to assess and evaluate symptoms and progress in treatment and Medication management  Assessment ELVERTA DIMICELI is a 53 yr old female who presented on 02/17/24 to [facility] ED with a chief complaint of anxiety. In the ED, she was found to be acutely psychotic with tangential speech, agitation, and delusional thought content. Patient was admitted involuntarily to [facility] on 8/14.  Although substance-induced psychosis has previously been documented, she additional presents with prior reported manic episodes. On this admission, she has presented with agitation and increased rate of speech, initially had significantly poor sleep and psychotic symptoms, disorganization of thoughts, behavior, concern for AVH, have been significant and unresponsive to antipsychotic medications. Given severity of symptoms, working diagnosis is a bipolar 1 diagnosis, currently considered to be in manic episode refractory to treatment. She has not been able to attend to ADLs since admission. Numerous antipsychotic medications and mood stabilizers have been tried as noted below but she has remained with significant psychotic symptoms. Due to inability to care for self and poor response to medications we have been pursuing transfer to a facility that offers ECT.   Staff have continued to documented disorganization in thoughts/behaviors, internal preoccupation. Has been compliant with medications and has not required PRNs. On interview she remains withdrawn and internally proccupied, disheveled and with reduced amount of speech. Will continue to work towards transfer to a facility that offers ECT.    DSM-5 diagnoses: Bipolar 1 Disorder, current episode manic, severe, with psychotic features  History of substance induced psychotic disorder   Plan:  Psychiatric medications:  -Continue Haldol  10 mg q12 for psychosis and mood stability -Continue Zyprexa  10 mg AM & 15 mg  QHS for psychosis and mood stability -Continue Depakote  DR 750 mg q12 for mood stability -Continue Ativan  1 mg q12 for agitation  -Continue Cogentin  1 mg BID for Drug Induced EPS -Continue Agitation Protocol: Zyprexa    Nicotine  Dependence: -Continue Nicotine  Gum 2 mg PRN  Additional PRNs -Continue Zofran  4 mg q8 PRN nausea/vomiting -Continue PRN's: Tylenol , Maalox, Atarax , Milk of Magnesia, Trazodone    --  The risks/benefits/side-effects/alternatives to medications were discussed in detail with the patient and time was given for questions. The patient consents to medication trials.                -- Metabolic profile and EKG monitoring obtained while on an atypical antipsychotic (BMI: 28.73 Lipid Panel: WNL except Trig: 180 HbgA1c: 5.1)              -- Encouraged patient to participate in unit milieu and in scheduled group therapies              -- Short Term Goals: Ability to identify changes in lifestyle to reduce recurrence of condition will improve, Ability to verbalize feelings will improve, Ability to disclose and discuss suicidal ideas, Ability to demonstrate self-control will improve, Ability to identify and develop effective coping behaviors will improve, Ability to maintain clinical measurements within normal limits will improve, Compliance with prescribed medications will improve, and Ability to identify triggers associated with substance abuse/mental health issues will improve             --  Long Term Goals: Improvement in symptoms so as ready for discharge   Safety and Monitoring:             -- Involuntary admission to inpatient psychiatric unit for safety, stabilization and treatment             -- Daily contact with patient to assess and evaluate symptoms and progress in treatment             -- Patient's case to be discussed in multi-disciplinary team meeting             -- Observation Level : q15 minute checks             -- Vital signs:  q12 hours             --  Precautions: suicide, elopement, and assault  Discharge Planning:              -- Appreciate SW assistance with transfer to a facility that provides ECT given the patient requires a higher level of care.   Leita LOISE Arts, MD 03/09/2024, 8:07 AM

## 2024-03-09 NOTE — Group Note (Signed)
 Date:  03/09/2024 Time:  1:38 AM  Group Topic/Focus:  Wrap-Up Group:   The focus of this group is to help patients review their daily goal of treatment and discuss progress on daily workbooks.    Participation Level:  Minimal  Participation Quality:  Appropriate, Intrusive, and Sharing  Affect:  Appropriate  Cognitive:  Appropriate and Delusional  Insight: Appropriate and Limited  Engagement in Group:  Engaged  Modes of Intervention:  Discussion and Socialization  Additional Comments:  Patient stated that she is trying to get to daughter and see granddaughter whom her daughter just gave birth too. Patient stated that she had a good day and slept most of the day. Patient rated her day a 5 out of 10. Patient did not have a goal today or tomorrow.   Eward Mace 03/09/2024, 1:38 AM

## 2024-03-10 DIAGNOSIS — F312 Bipolar disorder, current episode manic severe with psychotic features: Secondary | ICD-10-CM | POA: Diagnosis not present

## 2024-03-10 NOTE — Progress Notes (Signed)
   03/10/24 2000  Psych Admission Type (Psych Patients Only)  Admission Status Voluntary  Psychosocial Assessment  Patient Complaints None  Eye Contact Brief  Facial Expression Flat  Affect Preoccupied  Speech Soft  Interaction Isolative  Motor Activity Pacing  Appearance/Hygiene Disheveled;Poor hygiene  Behavior Characteristics Appropriate to situation  Mood Apprehensive  Thought Process  Coherency Disorganized  Content Preoccupation  Delusions None reported or observed  Perception Derealization  Hallucination None reported or observed  Judgment Poor  Confusion Mild  Danger to Self  Current suicidal ideation?  (Denies)  Agreement Not to Harm Self Yes  Description of Agreement Notify Staff  Danger to Others  Danger to Others None reported or observed

## 2024-03-10 NOTE — [facility] Suicide Risk Assessment (Addendum)
 Collateral contact - Winton Sheller (aunt) [phone]   Aunt said she will visit patient on Saturday and bring her clothes.    Aunt's address is:  [address], [place], [place] [address].   Alaijah Gibler, LCSWA 03/10/2024

## 2024-03-10 NOTE — Progress Notes (Signed)
 [facility] MD Progress Note  03/10/2024 7:27 AM Allison Bolton  MRN:  [id number]  Principal Problem: Bipolar affective disorder, current episode manic with psychotic symptoms (HCC) Diagnosis: Principal Problem:   Bipolar affective disorder, current episode manic with psychotic symptoms (HCC)   Total Time spent with patient:  I personally spent 35 minutes on the unit in direct patient care. The direct patient care time included face-to-face time with the patient, reviewing the patient's chart, communicating with other professionals, and coordinating care.   Identifying Information and brief psychiatric history:  Allison Bolton is a 53 yr old female with working psychiatric diagnoses of bipolar 1 disorder and a history of prior documented substance-induced psychosis. She does have a history of prior psychiatric hospitalizations with substance use driving psychotic symptoms, however on this admission has presented with clear manic symptoms that have not cleared despite time and medications. She has on prior suicide Attempt (2007), and Prior Psychiatric Hospitalizations (last- [facility] 01/2024).  On this admission she presented to [facility] ED on 02/17/24  with a chief complaint of anxiety. In the ED, she was found to be acutely psychotic with tangential speech, agitation, and delusional thought content. Patient was admitted involuntarily to [facility] on 8/14. Since admission the patient has been notably unresponsive to antipsychotic medications - remaining acutely psychotic and plan has been for transfer to facility that provides ECT for treatment of refractory mania with psychosis.    Interval events: Patient documented by staff to have flat affect, disorganized and preoccupied thought process, not expressing delusions or RTIS but was noted to be demonstrating odd mannerisms (marching in place). She remains disheveled with poor hygiene. Not attending groups and isolative to room. Has been compliant with  medications. No behavioral PRNs required.   Interview today:  On interview today the patient is distracted by others in the milieu. She reports she is fine and pan-denies all concerns including SI, HI and AVH. Seen with some bizarre mannerisms - turning in the hallway. No additional concerns voiced today.  Past Medical History:  Past Medical History:  Diagnosis Date   Anxiety    Arthritis    Asthma    Bipolar 1 disorder (HCC)    Diverticulosis    Dysrhythmia    sts I have heart palpitations   Fibromyalgia    H/O hiatal hernia    4   Headache(784.0)    High blood pressure    Meniere's disease    S/P colonoscopy    Dr. Golda 2010: few small diverticula at sigmoid. otherwise normal.    Shortness of breath     Past Surgical History:  Procedure Laterality Date   ABDOMINAL HYSTERECTOMY     APPENDECTOMY     CESAREAN SECTION     CHOLECYSTECTOMY     COLONOSCOPY N/A 12/25/2023   Procedure: COLONOSCOPY;  Surgeon: Eartha Angelia Sieving, MD;  Location: [facility] ENDO SUITE;  Service: Gastroenterology;  Laterality: N/A;  11:30am, asa 1   exploratory laparoscopy     FOOT SURGERY     HEMORRHOID SURGERY     LAPAROSCOPIC APPENDECTOMY  02/19/2011   Procedure: APPENDECTOMY LAPAROSCOPIC;  Surgeon: Oneil DELENA Budge;  Location: [facility] ORS;  Service: General;  Laterality: N/A;   LAPAROSCOPY  02/19/2011   Procedure: LAPAROSCOPY DIAGNOSTIC;  Surgeon: Oneil DELENA Budge;  Location: [facility] ORS;  Service: General;  Laterality: N/A;   left ovarian removal     multiple hernia repairs     Right ovarian removal     TONSILLECTOMY  TONSILLECTOMY AND ADENOIDECTOMY     tubes in ears     UMBILICAL HERNIA REPAIR  Dec 2011   Dr. Mavis   wisdom teeth removal     Family History:  Family History  Problem Relation Age of Onset   Thyroid  disease Mother    Colon cancer Father    Breast cancer Maternal Aunt    Colon cancer Brother    Colon cancer Other        paternal and maternal grandfather   Meniere's disease  Other    Anesthesia problems Neg Hx    Hypotension Neg Hx    Malignant hyperthermia Neg Hx    Pseudochol deficiency Neg Hx    Family Psychiatric  History:  None Reported   Social History:  Social History   Substance and Sexual Activity  Alcohol Use No   Comment: not since June 2012     Social History   Substance and Sexual Activity  Drug Use Yes   Types: Marijuana   Comment: patient denies any-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    Social History   Socioeconomic History   Marital status: Legally Separated    Spouse name: Not on file   Number of children: Not on file   Years of education: Not on file   Highest education level: Not on file  Occupational History   Not on file  Tobacco Use   Smoking status: Every Day    Current packs/day: 0.00    Average packs/day: 0.5 packs/day for 37.3 years (18.7 ttl pk-yrs)    Types: Cigarettes    Start date: 07/07/1981    Last attempt to quit: 11/05/2018    Years since quitting: 5.3   Smokeless tobacco: Never  Vaping Use   Vaping status: Never Used  Substance and Sexual Activity   Alcohol use: No    Comment: not since June 2012   Drug use: Yes    Types: Marijuana    Comment: patient denies any-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   Sexual activity: Never    Birth control/protection: Surgical  Other Topics Concern   Not on file  Social History Narrative   Not on file   Social Drivers of Health   Financial Resource Strain: Not on file  Food Insecurity: Food Insecurity Present (02/18/2024)   Hunger Vital Sign    Worried About Running Out of Food in the Last Year: Sometimes true    Ran Out of Food in the Last Year: Sometimes true  Transportation Needs: Unmet Transportation Needs (02/18/2024)   PRAPARE - Administrator, Civil Service (Medical): Yes    Lack of Transportation (Non-Medical): Yes  Physical Activity: Not on file  Stress: Not on file  Social Connections: Unknown (10/14/2021)   Received from  [facility]   Social Connections    Do your friends and family support you?: Not on file    What agencies support you?: Not on file    Current Medications: Current Facility-Administered Medications  Medication Dose Route Frequency Provider Last Rate Last Admin   acetaminophen  (TYLENOL ) tablet 650 mg  650 mg Oral Q6H PRN White, Patrice L, NP   650 mg at 03/10/24 0224   alum & mag hydroxide-simeth (MAALOX/MYLANTA) 200-200-20 MG/5ML suspension 30 mL  30 mL Oral Q4H PRN White, Patrice L, NP   30 mL at 03/07/24 2204   benztropine  (COGENTIN ) tablet 1 mg  1 mg Oral BID White, Patrice  L, NP   1 mg at 03/09/24 1624   divalproex  (DEPAKOTE ) DR tablet 750 mg  750 mg Oral Q12H Ji, Andrew, MD   750 mg at 03/09/24 1915   haloperidol  (HALDOL ) tablet 10 mg  10 mg Oral Q12H Ji, Andrew, MD   10 mg at 03/09/24 8084   hydrOXYzine  (ATARAX ) tablet 25 mg  25 mg Oral TID PRN White, Patrice L, NP   25 mg at 03/02/24 1627   LORazepam  (ATIVAN ) tablet 1 mg  1 mg Oral Q12H Lynnette Barter, MD   1 mg at 03/09/24 8084   magnesium  hydroxide (MILK OF MAGNESIA) suspension 30 mL  30 mL Oral Daily PRN White, Patrice L, NP       nicotine  polacrilex (NICORETTE ) gum 2 mg  2 mg Oral PRN Trudy Carwin, NP   2 mg at 03/06/24 9185   OLANZapine  (ZYPREXA ) injection 10 mg  10 mg Intramuscular TID PRN White, Patrice L, NP       OLANZapine  (ZYPREXA ) injection 5 mg  5 mg Intramuscular TID PRN White, Patrice L, NP       OLANZapine  zydis (ZYPREXA ) disintegrating tablet 10 mg  10 mg Oral Daily Stevens, Briana N, MD   10 mg at 03/09/24 0809   OLANZapine  zydis (ZYPREXA ) disintegrating tablet 15 mg  15 mg Oral QHS Mannie Ashley SAILOR, MD   15 mg at 03/09/24 2023   OLANZapine  zydis (ZYPREXA ) disintegrating tablet 5 mg  5 mg Oral TID PRN Teresa Jes L, NP   5 mg at 02/26/24 2050   ondansetron  (ZOFRAN -ODT) disintegrating tablet 4 mg  4 mg Oral Q8H PRN Lynnette Barter, MD   4 mg at 03/07/24 2205   traZODone  (DESYREL ) tablet 50 mg  50 mg Oral  QHS White, Patrice L, NP   50 mg at 03/09/24 2023    Lab Results:  No results found for this or any previous visit (from the past 48 hours).   Blood Alcohol level:  Lab Results  Component Value Date   [facility] <15 02/17/2024   ETH <15 01/14/2024    Metabolic Disorder Labs: Lab Results  Component Value Date   HGBA1C 5.1 01/18/2024   MPG 99.67 01/18/2024   MPG 105.41 05/11/2019   Lab Results  Component Value Date   PROLACTIN 24.1 (H) 05/11/2019   Lab Results  Component Value Date   CHOL 154 01/18/2024   TRIG 180 (H) 01/18/2024   HDL 49 01/18/2024   CHOLHDL 3.1 01/18/2024   VLDL 36 01/18/2024   LDLCALC 69 01/18/2024   LDLCALC 82 05/11/2019    Mental Status exam: Appearance: white female of slightly elevated BMI, disheveled and with poor dentition, seen in the hallway, spinning in circle Eye contact: poor Attitude towards examiner withdrawn, minimally responsive to questions, distracted from interview  Psychomotor: no agitation or retardation - bizarre mannerisms; spinning in the hall Speech: reduced amount, infrequent one-word responses  Language: simplistic  Mood: fine Affect: restricted in range, odd  Thought content: denying SI, HI, not overtly expressing delusional content  Thought Process: concrete, disorganized based on limited exam  Perception: denying AVH, not overtly RTIS  Insight: poor  Judgement: poor   Orientation: to self  Attention/Concentration: limited due to psychosis  Memory/Cognition: not formally assessed   Fund of Knowledge: Average    Musculoskeletal: Strength & Muscle Tone: within normal limits Gait & Station: normal Patient leans: N/A   Physical Exam HENT:     Head: Normocephalic and atraumatic.  Pulmonary:  Effort: Pulmonary effort is normal.  Musculoskeletal:        General: Normal range of motion.  Neurological:     General: No focal deficit present.    ROS Blood pressure (!) 116/92, pulse 86, temperature 97.8 F (36.6  C), temperature source Oral, resp. rate 16, height 5' 6 (1.676 m), weight 80.7 kg, SpO2 100%. Body mass index is 28.71 kg/m.   Treatment Plan Summary: Daily contact with patient to assess and evaluate symptoms and progress in treatment and Medication management  Assessment SHERLYNE CROWNOVER is a 53 yr old female who presented on 02/17/24 to [facility] ED with a chief complaint of anxiety. In the ED, she was found to be acutely psychotic with tangential speech, agitation, and delusional thought content. Patient was admitted involuntarily to [facility] on 8/14.  Although substance-induced psychosis has previously been documented, she additional presents with prior reported manic episodes. On this admission, she has presented with agitation and increased rate of speech, initially had significantly poor sleep and psychotic symptoms, disorganization of thoughts, behavior, concern for AVH, have been significant and unresponsive to antipsychotic medications. Given severity of symptoms, working diagnosis is a bipolar 1 diagnosis, currently considered to be in manic episode refractory to treatment. She has not been able to attend to ADLs since admission. Numerous antipsychotic medications and mood stabilizers have been tried as noted below but she has remained with significant psychotic symptoms. Due to inability to care for self and poor response to medications we have been pursuing transfer to a facility that offers ECT.   Staff have continued to documented disorganization in thoughts/behaviors, internal preoccupation and poor hygiene. Has been compliant with medications and has not required PRNs, but has been largely isolative to room and engages minimally with other staff and peers. On interview she has presented as minimally engaged and disorganized in thought process. Today was objectively demonstrating poor eye contact and easily distracted; turning in circles in the hall. Minimal, if any improvements and plan  remains towards transfer to a facility that offers ECT.    DSM-5 diagnoses: Bipolar 1 Disorder, current episode manic, severe, with psychotic features  History of substance induced psychotic disorder   Plan:  Psychiatric medications:  -Continue Haldol  10 mg q12 for psychosis and mood stability -Continue Zyprexa  10 mg AM & 15 mg QHS for psychosis and mood stability -Continue Depakote  DR 750 mg q12 for mood stability -Continue Ativan  1 mg q12 for agitation  -Continue Cogentin  1 mg BID for Drug Induced EPS -Continue Agitation Protocol: Zyprexa    Nicotine  Dependence: -Continue Nicotine  Gum 2 mg PRN  Additional PRNs -Continue Zofran  4 mg q8 PRN nausea/vomiting -Continue PRN's: Tylenol , Maalox, Atarax , Milk of Magnesia, Trazodone    --  The risks/benefits/side-effects/alternatives to medications were discussed in detail with the patient and time was given for questions. The patient consents to medication trials.                -- Metabolic profile and EKG monitoring obtained while on an atypical antipsychotic (BMI: 28.73 Lipid Panel: WNL except Trig: 180 HbgA1c: 5.1)              -- Encouraged patient to participate in unit milieu and in scheduled group therapies              -- Short Term Goals: Ability to identify changes in lifestyle to reduce recurrence of condition will improve, Ability to verbalize feelings will improve, Ability to disclose and discuss suicidal ideas, Ability to demonstrate  self-control will improve, Ability to identify and develop effective coping behaviors will improve, Ability to maintain clinical measurements within normal limits will improve, Compliance with prescribed medications will improve, and Ability to identify triggers associated with substance abuse/mental health issues will improve             -- Long Term Goals: Improvement in symptoms so as ready for discharge   Safety and Monitoring:             -- Involuntary admission to inpatient psychiatric  unit for safety, stabilization and treatment             -- Daily contact with patient to assess and evaluate symptoms and progress in treatment             -- Patient's case to be discussed in multi-disciplinary team meeting             -- Observation Level : q15 minute checks             -- Vital signs:  q12 hours             -- Precautions: suicide, elopement, and assault  Discharge Planning:              -- Appreciate SW assistance with transfer to a facility that provides ECT given the patient requires a higher level of care.   Leita LOISE Arts, MD 03/10/2024, 7:27 AM

## 2024-03-10 NOTE — Plan of Care (Signed)
 Psychiatry Plan of Care: Recent labs, EKG for ECT clearance      Latest Ref Rng & Units 03/07/2024    6:27 AM 02/17/2024    7:08 PM 01/14/2024    9:44 PM  CBC  WBC 4.0 - 10.5 K/uL 8.1  10.7  11.0   Hemoglobin 12.0 - 15.0 g/dL 84.5  86.2  85.9   Hematocrit 36.0 - 46.0 % 46.9  40.0  43.3   Platelets 150 - 400 K/uL 233  268  236       Latest Ref Rng & Units 03/07/2024    6:27 AM 02/17/2024    7:08 PM 01/30/2024    8:21 AM  CMP  Glucose 70 - 99 mg/dL 83  91    BUN 6 - 20 mg/dL 14  17    Creatinine 9.55 - 1.00 mg/dL 9.34  9.43    Sodium 864 - 145 mmol/L 142  138    Potassium 3.5 - 5.1 mmol/L 4.2  4.0    Chloride 98 - 111 mmol/L 107  106    CO2 22 - 32 mmol/L 22  23    Calcium 8.9 - 10.3 mg/dL 9.4  8.8    Total Protein 6.5 - 8.1 g/dL 6.1  6.5  6.8   Total Bilirubin 0.0 - 1.2 mg/dL 0.4  0.5  0.7   Alkaline Phos 38 - 126 U/L 58  52  54   AST 15 - 41 U/L 12  16  16    ALT 0 - 44 U/L 10  13  19      Most recent EKG completed 01/15/2024: -Normal sinus rhythm -QT/Qtc (bazet) 394/450

## 2024-03-10 NOTE — Plan of Care (Signed)
  Problem: Safety: Goal: Periods of time without injury will increase Outcome: Progressing   Problem: Activity: Goal: Interest or engagement in activities will improve Outcome: Not Progressing

## 2024-03-10 NOTE — Group Note (Signed)
 Date:  03/10/2024 Time:  8:45 PM  Group Topic/Focus:  Wrap-Up Group:   The focus of this group is to help patients review their daily goal of treatment and discuss progress on daily workbooks.    Participation Level:  Active  Participation Quality:  Appropriate  Affect:  Appropriate  Cognitive:  Appropriate  Insight: Appropriate  Engagement in Group:  Engaged  Modes of Intervention:  Education and Exploration  Additional Comments:  Patient attended and participated in group tonight.   She reports that she did not set a goal for herself.  Gwenn Chillington Dacosta 03/10/2024, 8:45 PM

## 2024-03-10 NOTE — Group Note (Signed)
 Date:  03/10/2024 Time:  6:29 PM  Group Topic/Focus:  Goals Group:   The focus of this group is to help patients establish daily goals to achieve during treatment and discuss how the patient can incorporate goal setting into their daily lives to aide in recovery. Orientation:   The focus of this group is to educate the patient on the purpose and policies of crisis stabilization and provide a format to answer questions about their admission.  The group details unit policies and expectations of patients while admitted.    Participation Level:  Did Not Attend   Allison Bolton Allison Bolton 03/10/2024, 6:29 PM

## 2024-03-10 NOTE — Progress Notes (Addendum)
(  Sleep Hours) -9.25 (Any PRNs that were needed, meds refused, or side effects to meds)- tylenol  (Any disturbances and when (visitation, over night)-none (Concerns raised by the patient)- writer observed pt constantly moving  and marching in place at medication window.  (SI/HI/AVH)-denied

## 2024-03-10 NOTE — Group Note (Signed)
 Recreation Therapy Group Note   Group Topic:Relaxation  Group Date: 03/10/2024 Start Time: 1000 End Time: 1040 Facilitators: Razan Siler-McCall, LRT,CTRS Location: [address] Hall Dayroom   Group Topic: Relaxation  Goal Area(s) Addresses:  Patient will identify the benefits of music.  Patient will identify benefit of practicing music therapy post d/c.   Behavioral Response:   Intervention: Music, Blue Tooth Speaker  Activity: Music Therapy. LRT and patients discussed the importance of music and how it can be used. LRT and patients discussed how music can illicit different emotions as well as be used as a coping mechanism to deal with life's challenges. Patients were then given to chance to choose songs that had importance to them. Patients would then explain the significance of the song they chose.    Education: Relaxation, Discharge Planning.   Education Outcome: Acknowledges education/In group clarification offered   Affect/Mood: Incongruent   Participation Level: None   Participation Quality: None   Behavior: Lethargic   Speech/Thought Process: Barely audible    Insight: Impaired   Judgement: Impaired   Modes of Intervention: Music   Patient Response to Interventions:  Disengaged   Education Outcome:  In group clarification offered    Clinical Observations/Individualized Feedback: Pt came into group late. Pt seemed to drawn to one of her peers. Pt was unable to be understood when she spoke and was more focused on food. Pt left early and didn't return.      Plan: Continue to engage patient in RT group sessions 2-3x/week.   Synethia Endicott-McCall, LRT,CTRS 03/10/2024 12:15 PM

## 2024-03-10 NOTE — Progress Notes (Addendum)
 Collateral contact - re:  Electroconvulsive therapy (ECT)   [facility] [address] [place], [place] [address] Phone:  [phone]  Fax: [phone]   The receptionist said that they don't have any beds available.  She advised to call tomorrow mid morning to check again.   [facility] Electroconvulsive therapy (ECT) [address] [place], [place], [place] [address] ECT line: [phone], x [phone] (referrals) Fax: [phone]  They don't have any beds available at this time.  CSW faxed recent labs, doctor's note and nurse's note.  CSW was informed that patient will have to continue coming for ECT therapy after discharge, and asked if there was anyone available to transport her.   [facility] Medicaid  Stacy informed that they provide transportation [facility] [id number].  They advised to call back Medicaid because patient's account is inactive.  Reference number # [id number].    [facility]  [address] [place], [place] [address] Phone:  [phone] Fax:  [phone]   CSW left a voicemail inquiring about bed availability (a referral was sent 2 days ago).      Berlin Mokry, LCSWA 03/10/2024

## 2024-03-11 DIAGNOSIS — F312 Bipolar disorder, current episode manic severe with psychotic features: Secondary | ICD-10-CM | POA: Diagnosis not present

## 2024-03-11 NOTE — Progress Notes (Signed)
 [facility] MD Progress Note  03/11/2024 8:27 AM Allison Bolton  MRN:  [id number]  Principal Problem: Bipolar affective disorder, current episode manic with psychotic symptoms (HCC) Diagnosis: Principal Problem:   Bipolar affective disorder, current episode manic with psychotic symptoms (HCC)   Total Time spent with patient:  I personally spent 35 minutes on the unit in direct patient care. The direct patient care time included face-to-face time with the patient, reviewing the patient's chart, communicating with other professionals, and coordinating care.   Identifying Information and brief psychiatric history:  Allison Bolton is a 53 yr old female with working psychiatric diagnoses of bipolar 1 disorder (vs SAD-BT) and a history of prior documented substance-induced psychosis. She does have a history of prior psychiatric hospitalizations with substance use driving psychotic symptoms, however on this admission has presented with clear manic and psychotic symptoms that have not cleared despite time and medications. She has on prior suicide Attempt (2007), and Prior Psychiatric Hospitalizations (last- [facility] 01/2024).  On this admission she presented to [facility] ED on 02/17/24  with a chief complaint of anxiety. In the ED, she was found to be acutely psychotic with tangential speech, agitation, and delusional thought content. Patient was admitted involuntarily to [facility] on 8/14. Since admission the patient has been notably unresponsive to antipsychotic medications - remaining acutely psychotic and plan has been for transfer to facility that provides ECT for treatment of refractory mania with psychosis.    Interval events: Patient documented by staff to appear preoccupied, isolative to room, when out of the room pacing or jumping in place. Thoughts remain disorganized and affect has been flat per NS, but denies all concerns, including SI, HI and AVH when asked. Slept 9 hrs overnight.  Interview today:   On interview today the patient was seen in her room. Difficulty engaging in conversation, appeared distracted, but on limited interview denied all concerns including SI, HI and AVH. Stated yes when asked if she went to groups yesterday but unable to verbalize what groups and what went on in those groups. No other information provided   Past Medical History:  Past Medical History:  Diagnosis Date   Anxiety    Arthritis    Asthma    Bipolar 1 disorder (HCC)    Diverticulosis    Dysrhythmia    sts I have heart palpitations   Fibromyalgia    H/O hiatal hernia    4   Headache(784.0)    High blood pressure    Meniere's disease    S/P colonoscopy    Dr. Golda 2010: few small diverticula at sigmoid. otherwise normal.    Shortness of breath     Past Surgical History:  Procedure Laterality Date   ABDOMINAL HYSTERECTOMY     APPENDECTOMY     CESAREAN SECTION     CHOLECYSTECTOMY     COLONOSCOPY N/A 12/25/2023   Procedure: COLONOSCOPY;  Surgeon: Eartha Angelia Sieving, MD;  Location: [facility] ENDO [facility];  Service: Gastroenterology;  Laterality: N/A;  11:30am, asa 1   exploratory laparoscopy     FOOT SURGERY     HEMORRHOID SURGERY     LAPAROSCOPIC APPENDECTOMY  02/19/2011   Procedure: APPENDECTOMY LAPAROSCOPIC;  Surgeon: Oneil DELENA Budge;  Location: [facility] ORS;  Service: General;  Laterality: N/A;   LAPAROSCOPY  02/19/2011   Procedure: LAPAROSCOPY DIAGNOSTIC;  Surgeon: Oneil DELENA Budge;  Location: [facility] ORS;  Service: General;  Laterality: N/A;   left ovarian removal     multiple hernia repairs  Right ovarian removal     TONSILLECTOMY     TONSILLECTOMY AND ADENOIDECTOMY     tubes in ears     UMBILICAL HERNIA REPAIR  Dec 2011   Dr. Mavis   wisdom teeth removal     Family History:  Family History  Problem Relation Age of Onset   Thyroid  disease Mother    Colon cancer Father    Breast cancer Maternal Aunt    Colon cancer Brother    Colon cancer Other        paternal and maternal  grandfather   Meniere's disease Other    Anesthesia problems Neg Hx    Hypotension Neg Hx    Malignant hyperthermia Neg Hx    Pseudochol deficiency Neg Hx    Family Psychiatric  History:  None Reported   Social History:  Social History   Substance and Sexual Activity  Alcohol Use No   Comment: not since June 2012     Social History   Substance and Sexual Activity  Drug Use Yes   Types: Marijuana   Comment: patient denies any-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    Social History   Socioeconomic History   Marital status: Legally Separated    Spouse name: Not on file   Number of children: Not on file   Years of education: Not on file   Highest education level: Not on file  Occupational History   Not on file  Tobacco Use   Smoking status: Every Day    Current packs/day: 0.00    Average packs/day: 0.5 packs/day for 37.3 years (18.7 ttl pk-yrs)    Types: Cigarettes    Start date: 07/07/1981    Last attempt to quit: 11/05/2018    Years since quitting: 5.3   Smokeless tobacco: Never  Vaping Use   Vaping status: Never Used  Substance and Sexual Activity   Alcohol use: No    Comment: not since June 2012   Drug use: Yes    Types: Marijuana    Comment: patient denies any-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   Sexual activity: Never    Birth control/protection: Surgical  Other Topics Concern   Not on file  Social History Narrative   Not on file   Social Drivers of Health   Financial Resource Strain: Not on file  Food Insecurity: Food Insecurity Present (02/18/2024)   Hunger Vital Sign    Worried About Running Out of Food in the Last Year: Sometimes true    Ran Out of Food in the Last Year: Sometimes true  Transportation Needs: Unmet Transportation Needs (02/18/2024)   PRAPARE - Administrator, Civil Service (Medical): Yes    Lack of Transportation (Non-Medical): Yes  Physical Activity: Not on file  Stress: Not on file  Social Connections:  Unknown (10/14/2021)   Received from [facility]   Social Connections    Do your friends and family support you?: Not on file    What agencies support you?: Not on file    Current Medications: Current Facility-Administered Medications  Medication Dose Route Frequency Provider Last Rate Last Admin   acetaminophen  (TYLENOL ) tablet 650 mg  650 mg Oral Q6H PRN White, Patrice L, NP   650 mg at 03/10/24 1222   alum & mag hydroxide-simeth (MAALOX/MYLANTA) 200-200-20 MG/5ML suspension 30 mL  30 mL Oral Q4H PRN White, Patrice L, NP   30 mL at 03/10/24 2217  benztropine  (COGENTIN ) tablet 1 mg  1 mg Oral BID White, Patrice L, NP   1 mg at 03/10/24 1657   divalproex  (DEPAKOTE ) DR tablet 750 mg  750 mg Oral Q12H Lynnette Barter, MD   750 mg at 03/10/24 2029   haloperidol  (HALDOL ) tablet 10 mg  10 mg Oral Q12H Ji, Andrew, MD   10 mg at 03/10/24 2028   hydrOXYzine  (ATARAX ) tablet 25 mg  25 mg Oral TID PRN White, Patrice L, NP   25 mg at 03/02/24 1627   LORazepam  (ATIVAN ) tablet 1 mg  1 mg Oral Q12H Lynnette Barter, MD   1 mg at 03/10/24 2028   magnesium  hydroxide (MILK OF MAGNESIA) suspension 30 mL  30 mL Oral Daily PRN White, Patrice L, NP       nicotine  polacrilex (NICORETTE ) gum 2 mg  2 mg Oral PRN Trudy Carwin, NP   2 mg at 03/06/24 9185   OLANZapine  (ZYPREXA ) injection 10 mg  10 mg Intramuscular TID PRN White, Patrice L, NP       OLANZapine  (ZYPREXA ) injection 5 mg  5 mg Intramuscular TID PRN White, Patrice L, NP       OLANZapine  zydis (ZYPREXA ) disintegrating tablet 10 mg  10 mg Oral Daily Stevens, Briana N, MD   10 mg at 03/10/24 9141   OLANZapine  zydis (ZYPREXA ) disintegrating tablet 15 mg  15 mg Oral QHS Allison Ashley SAILOR, MD   15 mg at 03/10/24 2029   OLANZapine  zydis (ZYPREXA ) disintegrating tablet 5 mg  5 mg Oral TID PRN Teresa Jes L, NP   5 mg at 02/26/24 2050   ondansetron  (ZOFRAN -ODT) disintegrating tablet 4 mg  4 mg Oral Q8H PRN Lynnette Barter, MD   4 mg at 03/07/24 2205   traZODone   (DESYREL ) tablet 50 mg  50 mg Oral QHS White, Patrice L, NP   50 mg at 03/10/24 2029    Lab Results:  No results found for this or any previous visit (from the past 48 hours).   Blood Alcohol level:  Lab Results  Component Value Date   [facility] <15 02/17/2024   ETH <15 01/14/2024    Metabolic Disorder Labs: Lab Results  Component Value Date   HGBA1C 5.1 01/18/2024   MPG 99.67 01/18/2024   MPG 105.41 05/11/2019   Lab Results  Component Value Date   PROLACTIN 24.1 (H) 05/11/2019   Lab Results  Component Value Date   CHOL 154 01/18/2024   TRIG 180 (H) 01/18/2024   HDL 49 01/18/2024   CHOLHDL 3.1 01/18/2024   VLDL 36 01/18/2024   LDLCALC 69 01/18/2024   LDLCALC 82 05/11/2019    Mental Status exam: Appearance: white female of slightly elevated BMI, remains disheveled and with poor dentition, seen reclining in bed  Eye contact: poor Attitude towards examiner  minimally responsive to questions, distracted from interview  Psychomotor: no agitation or retardation - bizarre mannerisms; spinning in the hall Speech: reduced amount, infrequent one-word responses  Language: simplistic  Mood: good Affect: restricted in range, odd  Thought content: denying SI, HI, not overtly expressing delusional content Thought Process: concrete, disorganized - appears to have thought blocking and significant difficulty engaging in any conversation  Perception: denying AVH, not overtly RTIS but is internally preoccupied and distracted  Insight: poor  Judgement: poor   Orientation: to self  Attention/Concentration: limited due to psychosis  Memory/Cognition: not formally assessed   Fund of Knowledge: Average    Musculoskeletal: Strength & Muscle Tone: within normal limits  Gait & Station: normal Patient leans: N/A   Physical Exam HENT:     Head: Normocephalic and atraumatic.  Pulmonary:     Effort: Pulmonary effort is normal.  Musculoskeletal:        General: Normal range of motion.   Neurological:     General: No focal deficit present.    ROS Blood pressure 104/77, pulse 60, temperature 97.8 F (36.6 C), temperature source Oral, resp. rate 16, height 5' 6 (1.676 m), weight 80.7 kg, SpO2 100%. Body mass index is 28.71 kg/m.   Treatment Plan Summary: Daily contact with patient to assess and evaluate symptoms and progress in treatment and Medication management  Assessment Allison Bolton is a 53 yr old female who presented on 02/17/24 to [facility] ED with a chief complaint of anxiety. In the ED, she was found to be acutely psychotic with tangential speech, agitation, and delusional thought content. Patient was admitted involuntarily to [facility] on 8/14.  Although substance-induced psychosis has previously been documented, she additional presents with prior reported manic episodes. On this admission, she has presented with agitation and increased rate of speech, initially had significantly poor sleep and psychotic symptoms, disorganization of thoughts, behavior, concern for AVH, have been significant and unresponsive to antipsychotic medications. Given clear manic episode prior to hospitalization and during first days of hospitalization, working diagnosis is a bipolar 1 disorder. SAD-BT is also high on the differential given lingering significant psychotic symptoms that have not resolved. Numerous antipsychotic medications and mood stabilizers have been tried as noted below but she has remained with significant disorganization in thoughts and behavior, poor self care.   Staff have continued to documented disorganization in thoughts/behaviors, internal preoccupation and poor hygiene. Has been compliant with medications and over the last several days has not required PRNs for agitation, however she is very withdrawn and isolative to room. On interview today she remains with significant disorganization in thoughts - with apparent thought blocking and distractibility making it  difficult to engage in conversation. Although denying all concerns, including SI, HI and AVH, she presents as internally preoccupied and is not forthcoming with information. Remains disheveled with overall poor self care - NS able to get her to shower one day this week when pushed but will otherwise lay in bed. he has presented as minimally engaged and disorganized in thought process. Given no significant improvements despite over a week of dual antipsychotic medications at relatively high doses plan remains transfer to a facility that offers ECT for higher level of care.   DSM-5 diagnoses: Bipolar 1 Disorder, current episode manic, severe, with psychotic features  -R/O SAD-BT given significant psychotic symptoms  History of substance induced psychotic disorder   Plan:  Psychiatric medications:  -Continue Haldol  10 mg q12 for psychosis and mood stability -Continue Zyprexa  10 mg AM & 15 mg QHS for psychosis and mood stability -Continue Depakote  DR 750 mg q12 for mood stability -Continue Ativan  1 mg q12 for agitation  -Continue Cogentin  1 mg BID for Drug Induced EPS -Continue Agitation Protocol: Zyprexa   PT/OT referral placed today, will f/u on recommendations   Nicotine  Dependence: -Continue Nicotine  Gum 2 mg PRN  Additional PRNs -Continue Zofran  4 mg q8 PRN nausea/vomiting -Continue PRN's: Tylenol , Maalox, Atarax , Milk of Magnesia, Trazodone    --  The risks/benefits/side-effects/alternatives to medications were discussed in detail with the patient and time was given for questions. The patient consents to medication trials.                --  Metabolic profile and EKG monitoring obtained while on an atypical antipsychotic (BMI: 28.73 Lipid Panel: WNL except Trig: 180 HbgA1c: 5.1)              -- Encouraged patient to participate in unit milieu and in scheduled group therapies              -- Short Term Goals: Ability to identify changes in lifestyle to reduce recurrence of condition  will improve, Ability to verbalize feelings will improve, Ability to disclose and discuss suicidal ideas, Ability to demonstrate self-control will improve, Ability to identify and develop effective coping behaviors will improve, Ability to maintain clinical measurements within normal limits will improve, Compliance with prescribed medications will improve, and Ability to identify triggers associated with substance abuse/mental health issues will improve             -- Long Term Goals: Improvement in symptoms so as ready for discharge   Safety and Monitoring:             -- Involuntary admission to inpatient psychiatric unit for safety, stabilization and treatment             -- Daily contact with patient to assess and evaluate symptoms and progress in treatment             -- Patient's case to be discussed in multi-disciplinary team meeting             -- Observation Level : q15 minute checks             -- Vital signs:  q12 hours             -- Precautions: suicide, elopement, and assault  Discharge Planning:              -- Appreciate SW assistance with transfer to a facility that provides ECT given the patient requires a higher level of care.   Allison LOISE Arts, MD 03/11/2024, 8:27 AM

## 2024-03-11 NOTE — BH Assessment (Signed)
(  Sleep Hours) - 9 (Any PRNs that were needed, meds refused, or side effects to meds)- Mylanta 30 mL,  (Any disturbances and when (visitation, over night)- None (Concerns raised by the patient)- None (SI/HI/AVH)- Denies

## 2024-03-11 NOTE — Progress Notes (Signed)
   03/11/24 0933  Psych Admission Type (Psych Patients Only)  Admission Status Voluntary  Psychosocial Assessment  Patient Complaints None  Eye Contact Brief  Facial Expression Flat  Affect Preoccupied  Speech Soft  Interaction Minimal  Motor Activity Pacing  Appearance/Hygiene Disheveled;Poor hygiene  Behavior Characteristics Cooperative  Mood Apprehensive  Thought Process  Coherency Disorganized  Content Preoccupation  Delusions None reported or observed  Perception Derealization  Hallucination None reported or observed  Judgment Poor  Confusion None  Danger to Self  Current suicidal ideation? Denies  Agreement Not to Harm Self Yes  Description of Agreement verbal  Danger to Others  Danger to Others None reported or observed

## 2024-03-11 NOTE — Group Note (Signed)
 Date:  03/11/2024 Time:  8:39 PM  Group Topic/Focus:  Wrap-Up Group:   The focus of this group is to help patients review their daily goal of treatment and discuss progress on daily workbooks.    Participation Level:  Active  Participation Quality:  Appropriate  Affect:  Appropriate  Cognitive:  Appropriate  Insight: Appropriate  Engagement in Group:  Engaged  Modes of Intervention:  Education and Exploration  Additional Comments:  Patient attended and participated in group tonight.  She reports that her goal was to get rest today and she did.  Gwenn Chillington Dacosta 03/11/2024, 8:39 PM

## 2024-03-11 NOTE — Group Note (Signed)
 Recreation Therapy Group Note   Group Topic:Coping Skills  Group Date: 03/11/2024 Start Time: 1015 End Time: 1045 Facilitators: Leam Madero-McCall, LRT,CTRS Location: 500 [address] Dayroom   Group Topic: Coping Skills    Goal Area(s) Addresses: Patient will define what a coping skill is. Patient will create a list of healthy coping skills beginning with each letter of the alphabet. Patient will successfully identify positive coping skills they can use post d/c.  Patient will acknowledge benefit(s) of using learned coping skills post d/c.   Behavioral Response: Minimal   Intervention: Worksheet   Activity: Coping A to Z. Patient asked to identify what a coping skill is and when they use them. Patients with Clinical research associate discussed healthy versus unhealthy coping skills. Next patients were given a blank worksheet titled Coping Skills A-Z. Patients were instructed to come up with at least one positive coping skill per letter of the alphabet.Patients were given 15 minutes to brainstorm before ideas were presented to the large group. Patients and LRT debriefed on the importance of coping skill selection based on situation and back-up plans when a skill tried is not effective. At the end of group, patients were given an handout of alphabetized strategies to keep for future reference.   Education: Pharmacologist, Scientist, physiological, Discharge Planning.    Education Outcome: Acknowledges education/Verbalizes understanding/In group clarification offered/Additional education needed   Affect/Mood: Flat   Participation Level: Minimal   Participation Quality: Independent   Behavior: Cooperative   Speech/Thought Process: Barely audible    Insight: Poor   Judgement: Poor   Modes of Intervention: Worksheet   Patient Response to Interventions:  Receptive   Education Outcome:  In group clarification offered    Clinical Observations/Individualized Feedback: Pt came in late to group. LRT had a  hard time understanding what pt was saying. When pt filled out her worksheet, the writing was harder to comprehend. Pt writing appeared to be just a bunch of letters on a page. Pt left early and didn't return.     Plan: Continue to engage patient in RT group sessions 2-3x/week.   Oliviagrace Crisanti-McCall, LRT,CTRS 03/11/2024 12:59 PM

## 2024-03-11 NOTE — Progress Notes (Signed)
 PT NOTE   PT order received. Our OT who covers BH will see and assess pt. If any further PT needs, he will let us  know. If he can address all issues then PT will sign off.    Thank you   Rexene, PT  Acute Rehab Dept [facility]) [phone]  03/11/2024

## 2024-03-11 NOTE — Evaluation (Signed)
 Occupational Therapy Evaluation Patient Details Name: Allison Bolton MRN: [id number] DOB: 09/22/1970 Today's Date: 03/11/2024   History of Present Illness   per H&P:Allison Bolton is a 53 yr old female with working psychiatric diagnoses of bipolar 1 disorder (vs SAD-BT) and a history of prior documented substance-induced psychosis. She does have a history of prior psychiatric hospitalizations with substance use driving psychotic symptoms, however on this admission has presented with clear manic and psychotic symptoms that have not cleared despite time and medications. She has on prior suicide Attempt (2007), and Prior Psychiatric Hospitalizations (last- [facility] 01/2024).     On this admission she presented to [facility] ED on 02/17/24  with a chief complaint of anxiety. In the ED, she was found to be acutely psychotic with tangential speech, agitation, and delusional thought content. Patient was admitted involuntarily to [facility] on 8/14. Since admission the patient has been notably unresponsive to antipsychotic medications - remaining acutely psychotic and plan has been for transfer to facility that provides ECT for treatment of refractory mania with psychosis.     Clinical Impressions OT was consulted to perform an assessment of current capabilities of basic ADLs / Bluffs and Mobility. Pt is impulsive and labile t/out this session. There was no evidence of overt mobility deficits. Pt does require at least MOD A in the form of verbal cuing in order to initiate basic tasks and redirection cues to sustain task engagement d/t impaired attention and concentration. Rancho [place] Amigos Scale score is noted as VI during this assessment and her Dasie   Cognitive Level is a 4.4. These scores suggest pt will require at least MOD A for most all basic ADLs and Lily in the form of cues and redirection. Pt is limited to familiar activities and at this time there is no capacity for new learning. Pt will require direct  supervision for any high consequence tasks, total assist for all Rx mgmt.   There are no OT needs at this time. It would be prudent to re-consult OT if/when pt's symptoms improve. An indicator of this would be pt's independent initiation of her basic ADLs and Thor or at least a decrease in the frequency / intensity of cues to perform these tasks.      If plan is discharge home, recommend the following:   Direct supervision/assist for financial management;Supervision due to cognitive status;Direct supervision/assist for medications management;Other (comment) (verbal cues for task initiation / bADLs)     Functional Status Assessment   Patient has not had a recent decline in their functional status       Mobility Bed Mobility Overal bed mobility: Independent             General bed mobility comments: impulsive    Transfers Overall transfer level: Independent                        Balance Overall balance assessment: Independent                                         ADL either performed or assessed with clinical judgement   ADL Overall ADL's : Needs assistance/impaired (pt is limited to familiar and basic ADLs w/ cues for initiation and redirection d/t impaired attention and concentration / sustained)  General ADL Comments: SBA for ADLs / MOD A overall w/ verbal cuing t/out tasks                                Pertinent Vitals/Pain Pain Assessment Pain Assessment: No/denies pain     Extremity/Trunk Assessment             Communication Communication Communication: Impaired (tangential / loose associations w/ disorganized speech)   Cognition Arousal: Lethargic Behavior During Therapy: Restless, Agitated, Lability Cognition: History of cognitive impairments, Rancho level             OT - Cognition Comments: RLAS: VI; Allen Cognitive Level: 4.4 (limited to  familiar activities w/ MOD A in the form of initiation cues / verbal cuing) redirection cues               Rancho Mirant Scales of Cognitive Functioning: Confused, Appropriate: Moderate Assistance [VI] Following commands: Intact       Cueing  General Comments   Cueing Techniques: Verbal cues;Tactile cues  pt is ambulatory w/o LOB or sway noted / no falls reported during this 22 dya stay per staff   Exercises     Shoulder Instructions      Home Living Family/patient expects to be discharged to:: Private residence                                        Prior Functioning/Environment Prior Level of Function : Independent/Modified Independent             Mobility Comments: independent ADLs Comments: independent    OT Problem List: Decreased cognition   OT Treatment/Interventions:       AM-PAC OT 6 Clicks Daily Activity     Outcome Measure Help from another person eating meals?: None Help from another person taking care of personal grooming?: A Little Help from another person toileting, which includes using toliet, bedpan, or urinal?: None Help from another person bathing (including washing, rinsing, drying)?: A Little   Help from another person to put on and taking off regular lower body clothing?: A Little 6 Click Score: 17   End of Session Nurse Communication: Mobility status  Activity Tolerance: Patient limited by lethargy Patient left: in bed  OT Visit Diagnosis: Other symptoms and signs involving cognitive function                Time: 8490-8470 OT Time Calculation (min): 20 min Charges:  OT General Charges $OT Visit: 1 Visit OT Evaluation $OT Eval Low Complexity: 1 Low OT Treatments $Self Care/Home Management : 8-22 mins  Dallas Purpura, OT   Dallas KANDICE Purpura 03/11/2024, 11:27 PM

## 2024-03-11 NOTE — Progress Notes (Signed)
 [facility] [address] [place], [place] [address] Phone:  [phone]  Fax: [phone]   Patient meets the criteria for admission, however a bed for her is not available today.  CSW was advised to call tomorrow mid morning to check again.  CSW faxed additional documents.     [facility] Electroconvulsive therapy (ECT) [address] [place], [place], [place] [address] ECT line: [phone], x [phone] (referrals) Fax: [phone]   CSW called to check on the bed availability.  Additional documents were needed so CSW sent them.    Doyle Kunath, LCSWA 03/11/2024

## 2024-03-11 NOTE — Plan of Care (Signed)
   Problem: Education: Goal: Knowledge of [facility] Education information/materials will improve Outcome: Progressing Goal: Emotional status will improve Outcome: Progressing Goal: Mental status will improve Outcome: Progressing Goal: Verbalization of understanding the information provided will improve Outcome: Progressing

## 2024-03-12 DIAGNOSIS — F312 Bipolar disorder, current episode manic severe with psychotic features: Secondary | ICD-10-CM | POA: Diagnosis not present

## 2024-03-12 NOTE — Progress Notes (Signed)
   03/12/24 1100  Psych Admission Type (Psych Patients Only)  Admission Status Voluntary  Psychosocial Assessment  Patient Complaints None  Eye Contact Brief  Facial Expression Animated  Affect Preoccupied  Speech Soft  Interaction Minimal  Motor Activity Fidgety  Appearance/Hygiene Disheveled  Behavior Characteristics Cooperative  Mood Apprehensive  Thought Process  Coherency Disorganized  Content Preoccupation  Delusions None reported or observed  Perception Derealization  Hallucination None reported or observed  Judgment Poor  Confusion None  Danger to Self  Current suicidal ideation? Denies  Danger to Others  Danger to Others None reported or observed

## 2024-03-12 NOTE — Plan of Care (Signed)
  Problem: Education: Goal: Emotional status will improve Outcome: Not Progressing   Problem: Activity: Goal: Interest or engagement in activities will improve Outcome: Not Progressing

## 2024-03-12 NOTE — Progress Notes (Signed)
 Shift Note  (Sleep Hours) -8.5  (Any PRNs that were needed, meds refused, or side effects to meds)- None  (Any disturbances and when (visitation, over night)-None  (Concerns raised by the patient)- Patient wanting to be discharge soon to visit daughter in [place]   (SI/HI/AVH)- Denies

## 2024-03-12 NOTE — Evaluation (Addendum)
 Physical Therapy Evaluation Patient Details Name: Allison Bolton MRN: [id number] DOB: May 01, 1971 Today's Date: 03/12/2024  History of Present Illness  per H&P:Allison Bolton is a 52 yr old female with working psychiatric diagnoses of bipolar 1 disorder (vs SAD-BT) and a history of prior documented substance-induced psychosis. She does have a history of prior psychiatric hospitalizations with substance use driving psychotic symptoms, however on this admission has presented with clear manic and psychotic symptoms that have not cleared despite time and medications. She has on prior suicide Attempt (2007), and Prior Psychiatric Hospitalizations (last- [facility] 01/2024).     On this admission she presented to [facility] ED on 02/17/24  with a chief complaint of anxiety. In the ED, she was found to be acutely psychotic with tangential speech, agitation, and delusional thought content. Patient was admitted involuntarily to [facility] on 8/14. Since admission the patient has been notably unresponsive to antipsychotic medications - remaining acutely psychotic and plan has been for transfer to facility that provides ECT for treatment of refractory mania with psychosis. PMH: HTN, fibromyalgia, meniere's disease.  Clinical Impression  Per [facility] staff, pt ambulates independently in the halls with some mild drifting to left and right but no loss of balance and no falls. Today, she had a loss of balance with sit to stand as she got out of bed, however she had been sleeping soundly and her sleep was disturbed by PT wakening her, she impulsively got out of bed very quickly and stumbled to the left requiring min assist to prevent a fall. Pt does report a h/o dizziness and falls (noted h/o Meniere's) and that she has used a cane at times but they keep getting stolen and I can't afford new ones. Pt was not able to provide details of fall history.  She seemed frustrated with being questioned and stated, I don't know when asked about  circumstances around her falls. She was not agreeable to further assessment of balance, she was very focused on returning to bed. She may benefit from use of a cane to address balance if this is permitted on the unit. I suspect the unsteadiness with sit to stand this morning is likely due to just having had her sleep disturbed and not being fully awake as she was steady with walking 250' without an assistive device and hasn't had falls during this 22 day admission. She appears safe to continue walking independently on the unit. No further PT indicated at this time.       If plan is discharge home, recommend the following: Direct supervision/assist for medications management;Assist for transportation   Can travel by private vehicle        Equipment Recommendations Cane  Recommendations for Other Services       Functional Status Assessment Patient has not had a recent decline in their functional status     Precautions / Restrictions Precautions Precautions: Fall Recall of Precautions/Restrictions: Impaired Precaution/Restrictions Comments: pt reports h/o frequent falls Restrictions Weight Bearing Restrictions Per Provider Order: No      Mobility  Bed Mobility Overal bed mobility: Independent             General bed mobility comments: impulsive    Transfers Overall transfer level: Needs assistance   Transfers: Sit to/from Stand Sit to Stand: Min assist           General transfer comment: pt had loss of balance, stumbling to L upon sit to stand, required min A to stabilize; Pt was sleeping upon  my arrival so had just been woken up. Pt reports she does often get dizzy with sit to stand.    Ambulation/Gait Ambulation/Gait assistance: Supervision Gait Distance (Feet): 250 Feet Assistive device: None Gait Pattern/deviations: Staggering left, Staggering right, Step-through pattern Gait velocity: WFL     General Gait Details: staff reports pt walks independently in  halls and hasn't had any falls but does drift left and right at times  Stairs            Wheelchair Mobility     Tilt Bed    Modified Rankin (Stroke Patients Only)       Balance Overall balance assessment: History of Falls                                           Pertinent Vitals/Pain Pain Assessment Pain Assessment: No/denies pain    Home Living Family/patient expects to be discharged to:: Private residence Living Arrangements: Other relatives Available Help at Discharge: Family   Home Access: Stairs to enter Entrance Stairs-Rails: Right Entrance Stairs-Number of Steps: 8   Home Layout: One level Home Equipment: None Additional Comments: reports she used to have a cane but everytime I get one they get stolen and I can't afford a new one    Prior Function Prior Level of Function : Independent/Modified Independent             Mobility Comments: independent ADLs Comments: independent     Extremity/Trunk Assessment   Upper Extremity Assessment Upper Extremity Assessment: Overall WFL for tasks assessed    Lower Extremity Assessment Lower Extremity Assessment: Overall WFL for tasks assessed    Cervical / Trunk Assessment Cervical / Trunk Assessment: Normal  Communication   Communication Communication: Impaired (tangential)    Cognition Arousal: Alert Behavior During Therapy: Restless, Agitated, Lability   PT - Cognitive impairments: Difficult to assess, Safety/Judgement, Awareness                   Rancho Levels of Cognitive Functioning Rancho Mirant Scales of Cognitive Functioning: Confused, Appropriate: Moderate Assistance [email or web address] Scales of Cognitive Functioning: Confused, Appropriate: Moderate Assistance [VI]           Cueing Cueing Techniques: Verbal cues, Tactile cues     General Comments General comments (skin integrity, edema, etc.): loss of balance with initial sit to stand but pt had  just been woken up by myself, staff reports no falls during this 22 day stay ; noted h/o Meniere's, pt does report she often feels dizzy and has had falls but couldn't provide details around frequency nor circumstances around falls, she stated I don't know when asked for fall details.    Exercises     Assessment/Plan    PT Assessment Patient does not need any further PT services  PT Problem List         PT Treatment Interventions      PT Goals (Current goals can be found in the Care Plan section)  Acute Rehab PT Goals Patient Stated Goal: to go home, to visit new grandchild in [place]  PT Goal Formulation: All assessment and education complete, DC therapy    Frequency       Co-evaluation               AM-PAC PT 6 Clicks Mobility  Outcome Measure Help needed turning from your back to your  side while in a flat bed without using bedrails?: None Help needed moving from lying on your back to sitting on the side of a flat bed without using bedrails?: None Help needed moving to and from a bed to a chair (including a wheelchair)?: None Help needed standing up from a chair using your arms (e.g., wheelchair or bedside chair)?: None Help needed to walk in hospital room?: None Help needed climbing 3-5 steps with a railing? : None 6 Click Score: 24    End of Session   Activity Tolerance: Patient tolerated treatment well Patient left: in bed Nurse Communication: Mobility status      Time: 9146-9095 PT Time Calculation (min) (ACUTE ONLY): 11 min   Charges:   PT Evaluation $PT Eval Moderate Complexity: 1 Mod   PT General Charges $$ ACUTE PT VISIT: 1 Visit         Sylvan Delon Copp PT 03/12/2024  Acute Rehabilitation Services  Office [phone]

## 2024-03-12 NOTE — BHH Group Notes (Addendum)
 Adult Psychoeducational Group Note  Date:  03/12/2024 Time:  7:32 PM  Group Topic/Focus:  Goals Group:   The focus of this group is to help patients establish daily goals to achieve during treatment and discuss how the patient can incorporate goal setting into their daily lives to aide in recovery. Orientation:   The focus of this group is to educate the patient on the purpose and policies of crisis stabilization and provide a format to answer questions about their admission.  The group details unit policies and expectations of patients while admitted.  Participation Level:  Did Not Attend  Participation Quality:    Affect:    Cognitive:    Insight:   Engagement in Group:    Modes of Intervention:    Additional Comments:    Alianys Chacko O 03/12/2024, 7:32 PM

## 2024-03-12 NOTE — Progress Notes (Signed)
(  Sleep Hours) - 6hrs (Any PRNs that were needed, meds refused, or side effects to meds)- Maalox (Any disturbances and when (visitation, over night) (Concerns raised by the patient)-  none (SI/HI/AVH)- denies

## 2024-03-12 NOTE — Group Note (Signed)
 Date:  03/12/2024 Time:  8:36 PM  Group Topic/Focus:  Wrap-Up Group:   The focus of this group is to help patients review their daily goal of treatment and discuss progress on daily workbooks.    Participation Level:  Active  Participation Quality:  Appropriate  Affect:  Appropriate  Cognitive:  Appropriate  Insight: Appropriate  Engagement in Group:  Engaged  Modes of Intervention:  Education and Exploration  Additional Comments:  Patient attended and participated in group tonight. She reports that the thing she like about herself is rocking.  Gwenn Chillington Dacosta 03/12/2024, 8:36 PM

## 2024-03-12 NOTE — Plan of Care (Signed)
  Problem: Education: Goal: Knowledge of [facility] Education information/materials will improve Outcome: Progressing Goal: Emotional status will improve Outcome: Progressing Goal: Mental status will improve Outcome: Progressing Goal: Verbalization of understanding the information provided will improve Outcome: Progressing   Problem: Activity: Goal: Interest or engagement in activities will improve Outcome: Progressing Goal: Sleeping patterns will improve Outcome: Progressing   Problem: Coping: Goal: Ability to verbalize frustrations and anger appropriately will improve Outcome: Progressing Goal: Ability to demonstrate self-control will improve Outcome: Progressing   Problem: Health Behavior/Discharge Planning: Goal: Identification of resources available to assist in meeting health care needs will improve Outcome: Progressing Goal: Compliance with treatment plan for underlying cause of condition will improve Outcome: Progressing   Problem: Physical Regulation: Goal: Ability to maintain clinical measurements within normal limits will improve Outcome: Progressing   Problem: Safety: Goal: Periods of time without injury will increase Outcome: Progressing   Problem: Education: Goal: Knowledge of [place] [facility] Education information/materials will improve Outcome: Progressing Goal: Emotional status will improve Outcome: Progressing Goal: Mental status will improve Outcome: Progressing Goal: Verbalization of understanding the information provided will improve Outcome: Progressing   Problem: Activity: Goal: Interest or engagement in activities will improve Outcome: Progressing Goal: Sleeping patterns will improve Outcome: Progressing   Problem: Coping: Goal: Ability to verbalize frustrations and anger appropriately will improve Outcome: Progressing Goal: Ability to demonstrate self-control will improve Outcome: Progressing   Problem: Health  Behavior/Discharge Planning: Goal: Identification of resources available to assist in meeting health care needs will improve Outcome: Progressing Goal: Compliance with treatment plan for underlying cause of condition will improve Outcome: Progressing   Problem: Physical Regulation: Goal: Ability to maintain clinical measurements within normal limits will improve Outcome: Progressing   Problem: Safety: Goal: Periods of time without injury will increase Outcome: Progressing   Problem: Activity: Goal: Will identify at least one activity in which they can participate Outcome: Progressing   Problem: Coping: Goal: Ability to identify and develop effective coping behavior will improve Outcome: Progressing Goal: Ability to interact with others will improve Outcome: Progressing Goal: Demonstration of participation in decision-making regarding own care will improve Outcome: Progressing Goal: Ability to use eye contact when communicating with others will improve Outcome: Progressing   Problem: Health Behavior/Discharge Planning: Goal: Identification of resources available to assist in meeting health care needs will improve Outcome: Progressing   Problem: Self-Concept: Goal: Will verbalize positive feelings about self Outcome: Progressing   Problem: Activity: Goal: Will verbalize the importance of balancing activity with adequate rest periods Outcome: Progressing   Problem: Education: Goal: Will be free of psychotic symptoms Outcome: Progressing Goal: Knowledge of the prescribed therapeutic regimen will improve Outcome: Progressing   Problem: Coping: Goal: Coping ability will improve Outcome: Progressing Goal: Will verbalize feelings Outcome: Progressing   Problem: Health Behavior/Discharge Planning: Goal: Compliance with prescribed medication regimen will improve Outcome: Progressing   Problem: Nutritional: Goal: Ability to achieve adequate nutritional intake will  improve Outcome: Progressing   Problem: Role Relationship: Goal: Ability to communicate needs accurately will improve Outcome: Progressing Goal: Ability to interact with others will improve Outcome: Progressing   Problem: Safety: Goal: Ability to redirect hostility and anger into socially appropriate behaviors will improve Outcome: Progressing Goal: Ability to remain free from injury will improve Outcome: Progressing   Problem: Self-Care: Goal: Ability to participate in self-care as condition permits will improve Outcome: Progressing   Problem: Self-Concept: Goal: Will verbalize positive feelings about self Outcome: Progressing

## 2024-03-12 NOTE — Progress Notes (Signed)
 [facility] MD Progress Note  03/12/2024 8:01 AM Allison Bolton  MRN:  [id number]  Principal Problem: Bipolar affective disorder, current episode manic with psychotic symptoms (HCC) Diagnosis: Principal Problem:   Bipolar affective disorder, current episode manic with psychotic symptoms (HCC)   Total Time spent with patient:  I personally spent 35 minutes on the unit in direct patient care. The direct patient care time included face-to-face time with the patient, reviewing the patient's chart, communicating with other professionals, and coordinating care.   Identifying Information and brief psychiatric history:  Allison Bolton is a 53 yr old female with working psychiatric diagnoses of bipolar 1 disorder (vs SAD-BT) and a history of prior documented substance-induced psychosis. She does have a history of prior psychiatric hospitalizations with substance use driving psychotic symptoms, however on this admission has presented with clear manic and psychotic symptoms that have not cleared despite time and medications. She has on prior suicide Attempt (2007), and Prior Psychiatric Hospitalizations (last- [facility] 01/2024).  On this admission she presented to [facility] ED on 02/17/24  with a chief complaint of anxiety. In the ED, she was found to be acutely psychotic with tangential speech, agitation, and delusional thought content. Patient was admitted involuntarily to [facility] on 8/14. Since admission the patient has been notably unresponsive to antipsychotic medications - remaining acutely psychotic and plan has been for transfer to facility that provides ECT for treatment of refractory mania with psychosis.    Interval events: Patient documented by staff to remain disheveled with poor hygiene, seen pacing at times and disorganized and preoccupied. Flat affect. Continues to deny all concerns and is cooperative with taking all medications. No PRNs for agitation required. OT did evaluate the patient yesterday -  no physical limitations, but requires assistance with ADLs due to significant disorganization/impaired cognition.   Interview today:  On interview today the patient was somnolent. Briefly roused to voice she is fine and then communicated in nods or head shakes. No SI, HI or AVH endorsed. No other concerns voiced.  Past Medical History:  Past Medical History:  Diagnosis Date   Anxiety    Arthritis    Asthma    Bipolar 1 disorder (HCC)    Diverticulosis    Dysrhythmia    sts I have heart palpitations   Fibromyalgia    H/O hiatal hernia    4   Headache(784.0)    High blood pressure    Meniere's disease    S/P colonoscopy    Dr. Golda 2010: few small diverticula at sigmoid. otherwise normal.    Shortness of breath     Past Surgical History:  Procedure Laterality Date   ABDOMINAL HYSTERECTOMY     APPENDECTOMY     CESAREAN SECTION     CHOLECYSTECTOMY     COLONOSCOPY N/A 12/25/2023   Procedure: COLONOSCOPY;  Surgeon: Eartha Angelia Sieving, MD;  Location: [facility] ENDO [facility];  Service: Gastroenterology;  Laterality: N/A;  11:30am, asa 1   exploratory laparoscopy     FOOT SURGERY     HEMORRHOID SURGERY     LAPAROSCOPIC APPENDECTOMY  02/19/2011   Procedure: APPENDECTOMY LAPAROSCOPIC;  Surgeon: Oneil DELENA Budge;  Location: [facility] ORS;  Service: General;  Laterality: N/A;   LAPAROSCOPY  02/19/2011   Procedure: LAPAROSCOPY DIAGNOSTIC;  Surgeon: Oneil DELENA Budge;  Location: [facility] ORS;  Service: General;  Laterality: N/A;   left ovarian removal     multiple hernia repairs     Right ovarian removal     TONSILLECTOMY  TONSILLECTOMY AND ADENOIDECTOMY     tubes in ears     UMBILICAL HERNIA REPAIR  Dec 2011   Dr. Mavis   wisdom teeth removal     Family History:  Family History  Problem Relation Age of Onset   Thyroid  disease Mother    Colon cancer Father    Breast cancer Maternal Aunt    Colon cancer Brother    Colon cancer Other        paternal and maternal grandfather   Meniere's  disease Other    Anesthesia problems Neg Hx    Hypotension Neg Hx    Malignant hyperthermia Neg Hx    Pseudochol deficiency Neg Hx    Family Psychiatric  History:  None Reported   Social History:  Social History   Substance and Sexual Activity  Alcohol Use No   Comment: not since June 2012     Social History   Substance and Sexual Activity  Drug Use Yes   Types: Marijuana   Comment: patient denies any-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    Social History   Socioeconomic History   Marital status: Legally Separated    Spouse name: Not on file   Number of children: Not on file   Years of education: Not on file   Highest education level: Not on file  Occupational History   Not on file  Tobacco Use   Smoking status: Every Day    Current packs/day: 0.00    Average packs/day: 0.5 packs/day for 37.3 years (18.7 ttl pk-yrs)    Types: Cigarettes    Start date: 07/07/1981    Last attempt to quit: 11/05/2018    Years since quitting: 5.3   Smokeless tobacco: Never  Vaping Use   Vaping status: Never Used  Substance and Sexual Activity   Alcohol use: No    Comment: not since June 2012   Drug use: Yes    Types: Marijuana    Comment: patient denies any-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   Sexual activity: Never    Birth control/protection: Surgical  Other Topics Concern   Not on file  Social History Narrative   Not on file   Social Drivers of Health   Financial Resource Strain: Not on file  Food Insecurity: Food Insecurity Present (02/18/2024)   Hunger Vital Sign    Worried About Running Out of Food in the Last Year: Sometimes true    Ran Out of Food in the Last Year: Sometimes true  Transportation Needs: Unmet Transportation Needs (02/18/2024)   PRAPARE - Administrator, Civil Service (Medical): Yes    Lack of Transportation (Non-Medical): Yes  Physical Activity: Not on file  Stress: Not on file  Social Connections: Unknown (10/14/2021)    Received from [facility]   Social Connections    Do your friends and family support you?: Not on file    What agencies support you?: Not on file    Current Medications: Current Facility-Administered Medications  Medication Dose Route Frequency Provider Last Rate Last Admin   acetaminophen  (TYLENOL ) tablet 650 mg  650 mg Oral Q6H PRN White, Patrice L, NP   650 mg at 03/10/24 1222   alum & mag hydroxide-simeth (MAALOX/MYLANTA) 200-200-20 MG/5ML suspension 30 mL  30 mL Oral Q4H PRN White, Patrice L, NP   30 mL at 03/11/24 2146   benztropine  (COGENTIN ) tablet 1 mg  1 mg Oral BID White, Patrice  L, NP   1 mg at 03/11/24 1628   divalproex  (DEPAKOTE ) DR tablet 750 mg  750 mg Oral Q12H Lynnette Barter, MD   750 mg at 03/11/24 2021   haloperidol  (HALDOL ) tablet 10 mg  10 mg Oral Q12H Ji, Andrew, MD   10 mg at 03/11/24 2021   hydrOXYzine  (ATARAX ) tablet 25 mg  25 mg Oral TID PRN White, Patrice L, NP   25 mg at 03/02/24 1627   LORazepam  (ATIVAN ) tablet 1 mg  1 mg Oral Q12H Lynnette Barter, MD   1 mg at 03/11/24 2021   magnesium  hydroxide (MILK OF MAGNESIA) suspension 30 mL  30 mL Oral Daily PRN White, Patrice L, NP       nicotine  polacrilex (NICORETTE ) gum 2 mg  2 mg Oral PRN Trudy Carwin, NP   2 mg at 03/06/24 9185   OLANZapine  (ZYPREXA ) injection 10 mg  10 mg Intramuscular TID PRN White, Patrice L, NP       OLANZapine  (ZYPREXA ) injection 5 mg  5 mg Intramuscular TID PRN White, Patrice L, NP       OLANZapine  zydis (ZYPREXA ) disintegrating tablet 10 mg  10 mg Oral Daily Stevens, Briana N, MD   10 mg at 03/11/24 9065   OLANZapine  zydis (ZYPREXA ) disintegrating tablet 15 mg  15 mg Oral QHS Mannie Ashley SAILOR, MD   15 mg at 03/11/24 2021   OLANZapine  zydis (ZYPREXA ) disintegrating tablet 5 mg  5 mg Oral TID PRN Teresa Jes L, NP   5 mg at 02/26/24 2050   ondansetron  (ZOFRAN -ODT) disintegrating tablet 4 mg  4 mg Oral Q8H PRN Lynnette Barter, MD   4 mg at 03/07/24 2205   traZODone  (DESYREL ) tablet 50 mg   50 mg Oral QHS White, Patrice L, NP   50 mg at 03/11/24 2021    Lab Results:  No results found for this or any previous visit (from the past 48 hours).   Blood Alcohol level:  Lab Results  Component Value Date   [facility] <15 02/17/2024   ETH <15 01/14/2024    Metabolic Disorder Labs: Lab Results  Component Value Date   HGBA1C 5.1 01/18/2024   MPG 99.67 01/18/2024   MPG 105.41 05/11/2019   Lab Results  Component Value Date   PROLACTIN 24.1 (H) 05/11/2019   Lab Results  Component Value Date   CHOL 154 01/18/2024   TRIG 180 (H) 01/18/2024   HDL 49 01/18/2024   CHOLHDL 3.1 01/18/2024   VLDL 36 01/18/2024   LDLCALC 69 01/18/2024   LDLCALC 82 05/11/2019    Mental Status exam: Appearance: white female of slightly elevated BMI, remains disheveled and with poor dentition, seen reclining in bed, sleeping and rouses briefly for limited interview  Eye contact: poor Attitude towards examiner  minimally responsive to questions Psychomotor: no agitation or retardation Speech: reduced amount, infrequent one-word responses  Language: simplistic  Mood: fine Affect: restricted in range, odd  Thought content: denying SI, HI, not overtly expressing delusional content Thought Process: remains with disorganized/confused TP on limited exam Perception: denying AVH, not overtly RTIS on limited exam Insight: poor  Judgement: poor   Orientation: to self  Attention/Concentration: limited due to psychosis  Memory/Cognition: not formally assessed   Fund of Knowledge: Average    Musculoskeletal: Strength & Muscle Tone: within normal limits Gait & Station: normal Patient leans: N/A   Physical Exam HENT:     Head: Normocephalic and atraumatic.  Pulmonary:     Effort: Pulmonary effort  is normal.  Musculoskeletal:        General: Normal range of motion.  Neurological:     General: No focal deficit present.    ROS Blood pressure (!) 112/59, pulse (!) 119, temperature 97.7 F (36.5  C), temperature source Oral, resp. rate 16, height 5' 6 (1.676 m), weight 80.7 kg, SpO2 99%. Body mass index is 28.71 kg/m.   Treatment Plan Summary: Daily contact with patient to assess and evaluate symptoms and progress in treatment and Medication management  Assessment Allison Bolton is a 54 yr old female who presented on 02/17/24 to [facility] ED with a chief complaint of anxiety. In the ED, she was found to be acutely psychotic with tangential speech, agitation, and delusional thought content. Patient was admitted involuntarily to [facility] on 8/14.  Although substance-induced psychosis has previously been documented, she additional presents with prior reported manic episodes. On this admission, she has presented with agitation and increased rate of speech, initially had significantly poor sleep and psychotic symptoms, disorganization of thoughts, behavior, concern for AVH, have been significant and unresponsive to antipsychotic medications. Given clear manic episode prior to hospitalization and during first days of hospitalization, working diagnosis is a bipolar 1 disorder. SAD-BT is also high on the differential given lingering significant psychotic symptoms that have not resolved. Numerous antipsychotic medications and mood stabilizers have been tried as noted below but she has remained with significant disorganization in thoughts and behavior, poor self care.   Staff have continued to documented disorganization in thoughts/behaviors, internal preoccupation and poor hygiene. Has been compliant with medications and in the last week or so has not required PRNs for agitation. She is very withdrawn and isolative to room. She remains with significant disorganization in thoughts and poor insight into her mental health concerns.  Although denying all concerns, including SI, HI and AVH, she presents as internally preoccupied and is not forthcoming with information. Remains disheveled with overall poor  self care, although can shower if pushed to do so by NS. Given no significant improvements despite over a week of dual antipsychotic medications at relatively high doses + mood stabilizing agent the plan remains transfer to a facility that offers ECT for higher level of care. OT did evaluate the patient as part of this process yesterday and noted no physical limitations but significant cognitive limitations requiring oversight on all IADLs and assistance with taking medications and some aspects of ADLs.   DSM-5 diagnoses: Bipolar 1 Disorder, current episode manic, severe, with psychotic features  -R/O SAD-BT given significant psychotic symptoms  History of substance induced psychotic disorder   Plan:  Psychiatric medications:  -Continue Haldol  10 mg q12 for psychosis and mood stability -Continue Zyprexa  10 mg AM & 15 mg QHS for psychosis and mood stability -Continue Depakote  DR 750 mg q12 for mood stability -Continue Ativan  1 mg q12 for agitation  -Continue Cogentin  1 mg BID for Drug Induced EPS -Continue Agitation Protocol: Zyprexa   PT/OT referral placed today, will f/u on recommendations   Nicotine  Dependence: -Continue Nicotine  Gum 2 mg PRN  Additional PRNs -Continue Zofran  4 mg q8 PRN nausea/vomiting -Continue PRN's: Tylenol , Maalox, Atarax , Milk of Magnesia, Trazodone    --  The risks/benefits/side-effects/alternatives to medications were discussed in detail with the patient and time was given for questions. The patient consents to medication trials.                -- Metabolic profile and EKG monitoring obtained while on an atypical antipsychotic (BMI: 28.73  Lipid Panel: WNL except Trig: 180 HbgA1c: 5.1)              -- Encouraged patient to participate in unit milieu and in scheduled group therapies              -- Short Term Goals: Ability to identify changes in lifestyle to reduce recurrence of condition will improve, Ability to verbalize feelings will improve, Ability to  disclose and discuss suicidal ideas, Ability to demonstrate self-control will improve, Ability to identify and develop effective coping behaviors will improve, Ability to maintain clinical measurements within normal limits will improve, Compliance with prescribed medications will improve, and Ability to identify triggers associated with substance abuse/mental health issues will improve             -- Long Term Goals: Improvement in symptoms so as ready for discharge   Safety and Monitoring:             -- Involuntary admission to inpatient psychiatric unit for safety, stabilization and treatment             -- Daily contact with patient to assess and evaluate symptoms and progress in treatment             -- Patient's case to be discussed in multi-disciplinary team meeting             -- Observation Level : q15 minute checks             -- Vital signs:  q12 hours             -- Precautions: suicide, elopement, and assault  Discharge Planning:              -- Appreciate SW assistance with transfer to a facility that provides ECT given the patient requires a higher level of care.   Leita LOISE Arts, MD 03/12/2024, 8:01 AM

## 2024-03-13 DIAGNOSIS — F312 Bipolar disorder, current episode manic severe with psychotic features: Secondary | ICD-10-CM | POA: Diagnosis not present

## 2024-03-13 NOTE — [facility] Group Notes (Signed)
 Adult Psychoeducational Group Note  Date:  03/13/2024 Time:  7:35 PM  Group Topic/Focus:  Goals Group:   The focus of this group is to help patients establish daily goals to achieve during treatment and discuss how the patient can incorporate goal setting into their daily lives to aide in recovery. Orientation:   The focus of this group is to educate the patient on the purpose and policies of crisis stabilization and provide a format to answer questions about their admission.  The group details unit policies and expectations of patients while admitted.  Participation Level:  Did Not Attend  Participation Quality:    Affect:    Cognitive:    Insight:   Engagement in Group:    Modes of Intervention:    Additional Comments:    Jaymarion Trombly O 03/13/2024, 7:35 PM

## 2024-03-13 NOTE — Group Note (Signed)
 Date:  03/13/2024 Time:  8:24 PM  Group Topic/Focus:  Wrap-Up Group:   The focus of this group is to help patients review their daily goal of treatment and discuss progress on daily workbooks.    Participation Level:  Active  Participation Quality:  Appropriate  Affect:  Appropriate  Cognitive:  Appropriate  Insight: Appropriate  Engagement in Group:  Engaged  Modes of Intervention:  Education and Exploration  Additional Comments:  Patient attended and participated in group tonight. She reports her goal today was to get sleep.  She did sleep today.  Gwenn Chillington Dacosta 03/13/2024, 8:24 PM

## 2024-03-13 NOTE — Group Note (Signed)
 [facility] LCSW Group Therapy Note   Group Date: 03/13/2024 Start Time: 1100 End Time: 1200   Type of Therapy/Topic:  Group Therapy:  Balancing Life with Trust, Honesty, and Balance.   Participation Level:  Did Not Attend    Hunter JONELLE Lever, LCSWA

## 2024-03-13 NOTE — Progress Notes (Signed)
 [facility] MD Progress Note  03/13/2024 7:55 AM Allison Bolton  MRN:  [id number]  Principal Problem: Bipolar affective disorder, current episode manic with psychotic symptoms (HCC) Diagnosis: Principal Problem:   Bipolar affective disorder, current episode manic with psychotic symptoms (HCC)   Total Time spent with patient:  I personally spent 35 minutes on the unit in direct patient care. The direct patient care time included face-to-face time with the patient, reviewing the patient's chart, communicating with other professionals, and coordinating care.   Identifying Information and brief psychiatric history:  Allison Bolton is a 53 yr old female with working psychiatric diagnoses of bipolar 1 disorder (vs SAD-BT) and a history of prior documented substance-induced psychosis. She does have a history of prior psychiatric hospitalizations with substance use driving psychotic symptoms, however on this admission has presented with clear manic and psychotic symptoms that have not cleared despite time and medications. She has on prior suicide Attempt (2007), and Prior Psychiatric Hospitalizations (last- [facility] 01/2024).  On this admission she presented to [facility] ED on 02/17/24  with a chief complaint of anxiety. In the ED, she was found to be acutely psychotic with tangential speech, agitation, and delusional thought content. Patient was admitted involuntarily to [facility] on 8/14. Since admission the patient has been notably unresponsive to antipsychotic medications - remaining acutely psychotic and plan has been for transfer to facility that provides ECT for treatment of refractory mania with psychosis.    Interval events: Patient documented by staff to remain disheveled with poor hygiene,  disorganized and preoccupied. Flat affect. Continues to deny all concerns and is cooperative with taking all medications. No PRNs for agitation required. PT also evaluated the patient yesterday - noted independent  ambulation, loss of balane when getting out of bed too quickly. Noted that she has not had any falls with independent ambulation on 22 days of admission and is safe to continue to do so. Does attend groups and is overall cooperative with care   Interview today:  On interview today the patient reports she is okay. When asked how her meeting with PT went yesterday she states I don't know. Does not volunteer additional information. Continues to report going to groups but is unable to give any informatin about what she is working on there. Denies all concerns including SI, HI and AVH when specifically asked, mostly in form of gestures (shaking head).  Past Medical History:  Past Medical History:  Diagnosis Date   Anxiety    Arthritis    Asthma    Bipolar 1 disorder (HCC)    Diverticulosis    Dysrhythmia    sts I have heart palpitations   Fibromyalgia    H/O hiatal hernia    4   Headache(784.0)    High blood pressure    Meniere's disease    S/P colonoscopy    Dr. Golda 2010: few small diverticula at sigmoid. otherwise normal.    Shortness of breath     Past Surgical History:  Procedure Laterality Date   ABDOMINAL HYSTERECTOMY     APPENDECTOMY     CESAREAN SECTION     CHOLECYSTECTOMY     COLONOSCOPY N/A 12/25/2023   Procedure: COLONOSCOPY;  Surgeon: Eartha Angelia Sieving, MD;  Location: [facility];  Service: Gastroenterology;  Laterality: N/A;  11:30am, asa 1   exploratory laparoscopy     FOOT SURGERY     HEMORRHOID SURGERY     LAPAROSCOPIC APPENDECTOMY  02/19/2011   Procedure: APPENDECTOMY LAPAROSCOPIC;  Surgeon: Oneil DELENA Budge;  Location: [facility] ORS;  Service: General;  Laterality: N/A;   LAPAROSCOPY  02/19/2011   Procedure: LAPAROSCOPY DIAGNOSTIC;  Surgeon: Oneil DELENA Budge;  Location: [facility] ORS;  Service: General;  Laterality: N/A;   left ovarian removal     multiple hernia repairs     Right ovarian removal     TONSILLECTOMY     TONSILLECTOMY AND ADENOIDECTOMY     tubes  in ears     UMBILICAL HERNIA REPAIR  Dec 2011   Dr. Budge   wisdom teeth removal     Family History:  Family History  Problem Relation Age of Onset   Thyroid  disease Mother    Colon cancer Father    Breast cancer Maternal Aunt    Colon cancer Brother    Colon cancer Other        paternal and maternal grandfather   Meniere's disease Other    Anesthesia problems Neg Hx    Hypotension Neg Hx    Malignant hyperthermia Neg Hx    Pseudochol deficiency Neg Hx    Family Psychiatric  History:  None Reported   Social History:  Social History   Substance and Sexual Activity  Alcohol Use No   Comment: not since June 2012     Social History   Substance and Sexual Activity  Drug Use Yes   Types: Marijuana   Comment: patient denies any-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    Social History   Socioeconomic History   Marital status: Legally Separated    Spouse name: Not on file   Number of children: Not on file   Years of education: Not on file   Highest education level: Not on file  Occupational History   Not on file  Tobacco Use   Smoking status: Every Day    Current packs/day: 0.00    Average packs/day: 0.5 packs/day for 37.3 years (18.7 ttl pk-yrs)    Types: Cigarettes    Start date: 07/07/1981    Last attempt to quit: 11/05/2018    Years since quitting: 5.3   Smokeless tobacco: Never  Vaping Use   Vaping status: Never Used  Substance and Sexual Activity   Alcohol use: No    Comment: not since June 2012   Drug use: Yes    Types: Marijuana    Comment: patient denies any-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   Sexual activity: Never    Birth control/protection: Surgical  Other Topics Concern   Not on file  Social History Narrative   Not on file   Social Drivers of Health   Financial Resource Strain: Not on file  Food Insecurity: Food Insecurity Present (02/18/2024)   Hunger Vital Sign    Worried About Running Out of Food in the Last Year: Sometimes  true    Ran Out of Food in the Last Year: Sometimes true  Transportation Needs: Unmet Transportation Needs (02/18/2024)   PRAPARE - Administrator, Civil Service (Medical): Yes    Lack of Transportation (Non-Medical): Yes  Physical Activity: Not on file  Stress: Not on file  Social Connections: Unknown (10/14/2021)   [facility] from [facility]   Social Connections    Do your friends and family support you?: Not on file    What agencies support you?: Not on file    Current Medications: Current Facility-Administered Medications  Medication Dose Route Frequency Provider Last Rate Last Admin  acetaminophen  (TYLENOL ) tablet 650 mg  650 mg Oral Q6H PRN White, Patrice L, NP   650 mg at 03/12/24 2214   alum & mag hydroxide-simeth (MAALOX/MYLANTA) 200-200-20 MG/5ML suspension 30 mL  30 mL Oral Q4H PRN White, Patrice L, NP   30 mL at 03/11/24 2146   benztropine  (COGENTIN ) tablet 1 mg  1 mg Oral BID White, Patrice L, NP   1 mg at 03/12/24 1640   divalproex  (DEPAKOTE ) DR tablet 750 mg  750 mg Oral Q12H Ji, Andrew, MD   750 mg at 03/12/24 2028   haloperidol  (HALDOL ) tablet 10 mg  10 mg Oral Q12H Ji, Andrew, MD   10 mg at 03/12/24 2028   hydrOXYzine  (ATARAX ) tablet 25 mg  25 mg Oral TID PRN White, Patrice L, NP   25 mg at 03/02/24 1627   LORazepam  (ATIVAN ) tablet 1 mg  1 mg Oral Q12H Lynnette Barter, MD   1 mg at 03/12/24 2028   magnesium  hydroxide (MILK OF MAGNESIA) suspension 30 mL  30 mL Oral Daily PRN White, Patrice L, NP       nicotine  polacrilex (NICORETTE ) gum 2 mg  2 mg Oral PRN Trudy Carwin, NP   2 mg at 03/06/24 9185   OLANZapine  (ZYPREXA ) injection 10 mg  10 mg Intramuscular TID PRN White, Patrice L, NP       OLANZapine  (ZYPREXA ) injection 5 mg  5 mg Intramuscular TID PRN White, Patrice L, NP       OLANZapine  zydis (ZYPREXA ) disintegrating tablet 10 mg  10 mg Oral Daily Mannie Ashley SAILOR, MD   10 mg at 03/12/24 9141   OLANZapine  zydis (ZYPREXA ) disintegrating tablet  15 mg  15 mg Oral QHS Mannie Ashley SAILOR, MD   15 mg at 03/12/24 2028   OLANZapine  zydis (ZYPREXA ) disintegrating tablet 5 mg  5 mg Oral TID PRN Teresa Jes L, NP   5 mg at 02/26/24 2050   ondansetron  (ZOFRAN -ODT) disintegrating tablet 4 mg  4 mg Oral Q8H PRN Lynnette Barter, MD   4 mg at 03/07/24 2205   traZODone  (DESYREL ) tablet 50 mg  50 mg Oral QHS White, Patrice L, NP   50 mg at 03/12/24 2213    Lab Results:  No results found for this or any previous visit (from the past 48 hours).   Blood Alcohol level:  Lab Results  Component Value Date   [facility] <15 02/17/2024   ETH <15 01/14/2024    Metabolic Disorder Labs: Lab Results  Component Value Date   HGBA1C 5.1 01/18/2024   MPG 99.67 01/18/2024   MPG 105.41 05/11/2019   Lab Results  Component Value Date   PROLACTIN 24.1 (H) 05/11/2019   Lab Results  Component Value Date   CHOL 154 01/18/2024   TRIG 180 (H) 01/18/2024   HDL 49 01/18/2024   CHOLHDL 3.1 01/18/2024   VLDL 36 01/18/2024   LDLCALC 69 01/18/2024   LDLCALC 82 05/11/2019    CBC    Component Value Date/Time   WBC 8.1 03/07/2024 0627   RBC 5.42 (H) 03/07/2024 0627   HGB 15.4 (H) 03/07/2024 0627   HCT 46.9 (H) 03/07/2024 0627   PLT 233 03/07/2024 0627   MCV 86.5 03/07/2024 0627   MCH 28.4 03/07/2024 0627   MCHC 32.8 03/07/2024 0627   RDW 12.5 03/07/2024 0627   LYMPHSABS 3.8 03/07/2024 0627   MONOABS 0.8 03/07/2024 0627   EOSABS 0.4 03/07/2024 0627   BASOSABS 0.1 03/07/2024 0627   CMP CMP  Component Value Date/Time   NA 142 03/07/2024 0627   K 4.2 03/07/2024 0627   CL 107 03/07/2024 0627   CO2 22 03/07/2024 0627   GLUCOSE 83 03/07/2024 0627   BUN 14 03/07/2024 0627   CREATININE 0.65 03/07/2024 0627   CREATININE 0.82 05/07/2012 1228   CALCIUM 9.4 03/07/2024 0627   PROT 6.1 (L) 03/07/2024 0627   ALBUMIN 3.9 03/07/2024 0627   AST 12 (L) 03/07/2024 0627   ALT 10 03/07/2024 0627   ALKPHOS 58 03/07/2024 0627   BILITOT 0.4 03/07/2024 0627    GFRNONAA >60 03/07/2024 0627      Most recent EKG completed 01/15/2024: -Normal sinus rhythm -QT/Qtc (bazet) 394/450      Mental Status exam: Appearance: white female of slightly elevated BMI, remains disheveled and with poor dentition, seen reclining in bed Eye contact: poor Attitude towards examiner  minimally responsive to questions Psychomotor: no agitation or retardation Speech: reduced amount, infrequent one-word responses  Language: simplistic  Mood: fine Affect: restricted in range, odd  Thought content: denying SI, HI, not overtly expressing delusional content Thought Process: remains with disorganized/confused TP  Perception: denying AVH, not overtly RTIS on limited exam Insight: poor  Judgement: poor   Orientation: to self  Attention/Concentration: limited due to psychosis  Memory/Cognition: not formally assessed   Fund of Knowledge: Average    Musculoskeletal: Strength & Muscle Tone: within normal limits Gait & Station: normal Patient leans: N/A   Physical Exam HENT:     Head: Normocephalic and atraumatic.  Pulmonary:     Effort: Pulmonary effort is normal.  Musculoskeletal:        General: Normal range of motion.  Neurological:     General: No focal deficit present.    ROS Blood pressure 111/79, pulse 84, temperature 97.7 F (36.5 C), resp. rate 16, height 5' 6 (1.676 m), weight 80.7 kg, SpO2 99%. Body mass index is 28.71 kg/m.   Treatment Plan Summary: Daily contact with patient to assess and evaluate symptoms and progress in treatment and Medication management  Assessment Allison Bolton is a 53 yr old female who presented on 02/17/24 to [facility] ED with a chief complaint of anxiety. In the ED, she was found to be acutely psychotic with tangential speech, agitation, and delusional thought content. Patient was admitted involuntarily to [facility] on 8/14.  Although substance-induced psychosis has previously been documented, she additional  presents with prior reported manic episodes. On this admission, she has presented with agitation and increased rate of speech, initially had significantly poor sleep and psychotic symptoms, disorganization of thoughts, behavior, concern for AVH, have been significant and unresponsive to antipsychotic medications. Given clear manic episode prior to hospitalization and during first days of hospitalization, working diagnosis is a bipolar 1 disorder. SAD-BT is also high on the differential given lingering significant psychotic symptoms that have not resolved. Numerous antipsychotic medications and mood stabilizers have been tried as noted below but she has remained with significant disorganization in thoughts and behavior, poor self care.   Staff have continued to documented disorganization in thoughts/behaviors, internal preoccupation and poor hygiene. Has been compliant with medications and in the last week or so has not required PRNs for agitation. She is very withdrawn and isolative to room. She remains with significant disorganization in thoughts and poor insight into her mental health concerns.  Although denying all concerns, including SI, HI and AVH, she presents as internally preoccupied and is not forthcoming with information. Remains disheveled with overall poor self  care, although can shower if pushed to do so by NS. Given no significant improvements despite over a week of dual antipsychotic medications at relatively high doses + mood stabilizing agent the plan remains transfer to a facility that offers ECT for higher level of care.    OT evaluated the patient on 9/5; noted no physical limitations but significant cognitive limitations requiring oversight on all IADLs and assistance with taking medications and some aspects of ADLs.   PT evaluated the patient on 9/6: noted independent ambulation, loss of balance when getting out of bed too quickly but no fall. Noted that she has not had any falls with  independent ambulation on 22 days of admission and is safe to continue to do so.   DSM-5 diagnoses: Bipolar 1 Disorder, current episode manic, severe, with psychotic features  -R/O SAD-BT given significant psychotic symptoms lingering approximately 1.5 weeks after resolution of frank mania. History of substance induced psychotic disorder   Plan:  Psychiatric medications:  -Continue Haldol  10 mg q12 for psychosis and mood stability -Continue Zyprexa  10 mg AM & 15 mg QHS for psychosis and mood stability -Continue Depakote  DR 750 mg q12 for mood stability -Continue Ativan  1 mg q12 for agitation  -Continue Cogentin  1 mg BID for Drug Induced EPS -Continue Agitation Protocol: Zyprexa   PT/OT referral placed today, will f/u on recommendations   Nicotine  Dependence: -Continue Nicotine  Gum 2 mg PRN  Additional PRNs -Continue Zofran  4 mg q8 PRN nausea/vomiting -Continue PRN's: Tylenol , Maalox, Atarax , Milk of Magnesia, Trazodone    --  The risks/benefits/side-effects/alternatives to medications were discussed in detail with the patient and time was given for questions. The patient consents to medication trials.                -- Metabolic profile and EKG monitoring obtained while on an atypical antipsychotic (BMI: 28.73 Lipid Panel: WNL except Trig: 180 HbgA1c: 5.1)              -- Encouraged patient to participate in unit milieu and in scheduled group therapies              -- Short Term Goals: Ability to identify changes in lifestyle to reduce recurrence of condition will improve, Ability to verbalize feelings will improve, Ability to disclose and discuss suicidal ideas, Ability to demonstrate self-control will improve, Ability to identify and develop effective coping behaviors will improve, Ability to maintain clinical measurements within normal limits will improve, Compliance with prescribed medications will improve, and Ability to identify triggers associated with substance abuse/mental  health issues will improve             -- Long Term Goals: Improvement in symptoms so as ready for discharge   Safety and Monitoring:             -- Involuntary admission to inpatient psychiatric unit for safety, stabilization and treatment             -- Daily contact with patient to assess and evaluate symptoms and progress in treatment             -- Patient's case to be discussed in multi-disciplinary team meeting             -- Observation Level : q15 minute checks             -- Vital signs:  q12 hours             -- Precautions: suicide, elopement, and assault  Discharge Planning:              --  Appreciate SW assistance with transfer to a facility that provides ECT given the patient requires a higher level of care. PT and OT notes both in with no functional limitations documented. Labs have been faxed, but CBC, CMP, and most recent EKG can be seen in this progress note documented above   Leita LOISE Arts, MD 03/13/2024, 7:55 AM

## 2024-03-13 NOTE — Group Note (Signed)
 [facility] LCSW Group Therapy Note  Date/Time:  @TD @ 10:00-11:00  Type of Therapy and Topic:  Group Therapy:  Personal Bill of Rights  Participation Level:  Did Not Attend   Description of Group This process group involved a discussion with and between patients about Understanding self.   The difference between healthy and unhealthy coping skills was described, then examples were elicited from group members for their Personal bill of Rights.  This then was followed by a discussion about boundaries, respects and what the patient wants, what it is, how important it is, and why we choose the coping techniques we choose.  Participants were encouraged to think about knowing what they want as necessary and positive.  Therapeutic Goals Patient will identify and describe what their boundary consists of Patient will participate in generating ideas when they use their coping skills to address their boundary  Patients will be supportive of one another and receive support from others Patients will understand the need all humans have to   Summary of Patient Progress:  NA  Therapeutic Modalities Brief Solution-Focused Therapy Psychoeducation  Jacquez Sheetz O Santana Edell, LCSWA 03/13/2024  1:36 PM

## 2024-03-13 NOTE — Plan of Care (Signed)
   Problem: Education: Goal: Emotional status will improve Outcome: Progressing Goal: Mental status will improve Outcome: Progressing Goal: Verbalization of understanding the information provided will improve Outcome: Progressing

## 2024-03-13 NOTE — Progress Notes (Signed)
 D. Pt remains disheveled, and continues to present disorganized- withdrawn and isolated to room. Pt has been compliant with meds and denied SI/HI and A/VH, but doesn't forward information.   A. Labs and vitals monitored.  R. Pt remains safe with 15 minute checks. Will continue POC.

## 2024-03-14 ENCOUNTER — Encounter (HOSPITAL_COMMUNITY): Payer: Self-pay

## 2024-03-14 DIAGNOSIS — F312 Bipolar disorder, current episode manic severe with psychotic features: Secondary | ICD-10-CM | POA: Diagnosis not present

## 2024-03-14 NOTE — Progress Notes (Addendum)
 Collateral contact - re:  Electroconvulsive therapy (ECT)   [facility] [address], [place], [place] [address] Phone:  [phone] x [phone]  Fax: [phone]  There are no beds available.   [facility] Electroconvulsive therapy (ECT) [address] [place], [place], [place] [address] ECT line: [phone], x [phone] Fax: [phone]  CSW left a voicemail.  Crystal called CSW and left a voicemail advising to call back at [phone], x [phone], ID # [id number], input call back number#.  CSW followed the instructions.  Today they didn't accept any new patients due to Covid.  She advised to call tomorrow at 8 AM.    [facility] [address], [place], [place] [address] [phone] - ECT phone number [phone] - ECT fax [phone] - ECT fax  CSW left a voicemail.  CSW faxed the documents to [phone].  CSW was asked to resend documents to fax # [phone].  CSW completed this task.  The woman said they don't have any beds available.   [facility] [address], [place], [place] [address] [phone]  Woman advised to call [phone], and they could direct CSW.  CSW called and there was no response.  Voicemail wasn't an option.     Woman said that [place] location does ECT.   [facility] [address] [place], [place] [address] [phone]  CSW left a voicemail.   [facility] [address] [place], [place] [address] [phone]  CSW was advised that the hospital does ECT, and Ms. Katheryn, ECT coordinator, is handing this [phone].  CSW left a voicemail for Ms. Lori.     Majid Mccravy, LCSWA 03/14/2024

## 2024-03-14 NOTE — Group Note (Addendum)
 Recreation Therapy Group Note   Group Topic:Stress Management  Group Date: 03/14/2024 Start Time: 1025 End Time: 1053 Facilitators: Mykiah Schmuck-McCall, LRT,CTRS Location: [address] Dayroom   Group Focus: Stress Management  Goal Area(s) Addresses:  Patient will actively participate in stress management techniques presented during session.  Patient will successfully identify benefit of practicing stress management post d/c.   Behavioral Response:   Intervention: Guided exercise with ambient sound and script  Activity : Progressive Muscle Relaxation. LRT provided education, instruction and demonstration on practice of Progressive Muscle Relaxation. Patients also participated in yoga poses to help stretch and release tension as well. LRT informed pts about resources to access pre-recorded scripts for PMR post d/c via Youtube and other apps or via internet with a smartphone, tablet, and/or computer.  Education:  Stress Management, Discharge Planning.   Education Outcome: Acknowledges education   Affect/Mood: N/A   Participation Level: Did not attend    Clinical Observations/Individualized Feedback:      Plan: Continue to engage patient in RT group sessions 2-3x/week.   Tarisa Paola-McCall, LRT,CTRS 03/14/2024 11:53 AM

## 2024-03-14 NOTE — Progress Notes (Signed)
 Collateral contact - Winton Sheller (aunt) [phone]   Aunt said that she didn't get a chance to visit patient on Saturday.  She said she will call patient later today.  Aunt said that patient smokes marijuana, and she believes that patient's symptoms were drug-induced.     Ayliana Casciano, LCSWA 03/14/2024

## 2024-03-14 NOTE — Progress Notes (Signed)
   03/14/24 2000  Psych Admission Type (Psych Patients Only)  Admission Status Involuntary  Psychosocial Assessment  Patient Complaints None  Eye Contact Poor  Facial Expression Flat  Affect Preoccupied;Other (Comment)  Speech Incoherent  Interaction Isolative  Motor Activity Pacing  Appearance/Hygiene Body odor;Bizarre;Poor hygiene (Assist with ADL's/Pt took a shower.)  Behavior Characteristics Calm;Cooperative  Mood Preoccupied  Thought Process  Coherency Disorganized  Content Preoccupation  Delusions None reported or observed  Perception UTA  Hallucination None reported or observed  Judgment Poor  Confusion None  Danger to Self  Current suicidal ideation?  (Denies)  Agreement Not to Harm Self Yes  Description of Agreement Notify Staff  Danger to Others  Danger to Others None reported or observed

## 2024-03-14 NOTE — Progress Notes (Signed)
(  Sleep Hours) -  8.25 (Any PRNs that were needed, meds refused, or side effects to meds)- zyprexa , mylanta (Any disturbances and when (visitation, over night)- none (Concerns raised by the patient)- none (SI/HI/AVH)- denies

## 2024-03-14 NOTE — Progress Notes (Signed)
 [facility] MD Progress Note  03/14/2024 2:45 PM Allison Bolton  MRN:  [id number]  Principal Problem: Bipolar affective disorder, current episode manic with psychotic symptoms (HCC) Diagnosis: Principal Problem:   Bipolar affective disorder, current episode manic with psychotic symptoms (HCC)   Total Time spent with patient:  I personally spent 25 minutes on the unit in direct patient care. The direct patient care time included face-to-face time with the patient, reviewing the patient's chart, communicating with other professionals, and coordinating care.   Identifying Information and brief psychiatric history:  Allison Bolton is a 53 yr old female with working psychiatric diagnoses of bipolar 1 disorder (vs SAD-BT) and a history of prior documented substance-induced psychosis. She does have a history of prior psychiatric hospitalizations with substance use driving psychotic symptoms, however on this admission has presented with clear manic and psychotic symptoms that have not cleared despite time and medications. She has one prior suicide Attempt (2007), and Prior Psychiatric Hospitalizations (last- [facility] 01/2024).  On this admission she presented to [facility] on 02/17/24  with a chief complaint of anxiety. In the ED, she was found to be acutely psychotic with tangential speech, agitation, and delusional thought content. Patient was admitted involuntarily to [facility] on 8/14. Since admission the patient has been notably unresponsive to antipsychotic medications - remaining acutely psychotic and plan has been for transfer to facility that provides ECT for treatment of refractory mania with psychosis.    Interval events: PRN Zydis last night, slept 8 hr.  Interview today:  The patient appears dysphoric and disorganized today. She makes odd comments about her daughter and says that she needs to leave the hospital to be with her. I told her that she needs further treatment and cannot leave now. She  yells briefly in frustration then falls back onto the bed. She denies further complaints, no SI/HI/AVH. The patient appeared aggravated so I did not continue the interview.   Called [facility] - no bed today. Called aunt, who appears to be the only involved social support. No answer or return call.   Past Medical History:  Past Medical History:  Diagnosis Date   Anxiety    Arthritis    Asthma    Bipolar 1 disorder (HCC)    Diverticulosis    Dysrhythmia    sts I have heart palpitations   Fibromyalgia    H/O hiatal hernia    4   Headache(784.0)    High blood pressure    Meniere's disease    S/P colonoscopy    Dr. Golda 2010: few small diverticula at sigmoid. otherwise normal.    Shortness of breath     Past Surgical History:  Procedure Laterality Date   ABDOMINAL HYSTERECTOMY     APPENDECTOMY     CESAREAN SECTION     CHOLECYSTECTOMY     COLONOSCOPY N/A 12/25/2023   Procedure: COLONOSCOPY;  Surgeon: Eartha Angelia Sieving, MD;  Location: [facility];  Service: Gastroenterology;  Laterality: N/A;  11:30am, asa 1   exploratory laparoscopy     FOOT SURGERY     HEMORRHOID SURGERY     LAPAROSCOPIC APPENDECTOMY  02/19/2011   Procedure: APPENDECTOMY LAPAROSCOPIC;  Surgeon: Oneil DELENA Budge;  Location: [facility] ORS;  Service: General;  Laterality: N/A;   LAPAROSCOPY  02/19/2011   Procedure: LAPAROSCOPY DIAGNOSTIC;  Surgeon: Oneil DELENA Budge;  Location: [facility] ORS;  Service: General;  Laterality: N/A;   left ovarian removal     multiple hernia repairs  Right ovarian removal     TONSILLECTOMY     TONSILLECTOMY AND ADENOIDECTOMY     tubes in ears     UMBILICAL HERNIA REPAIR  Dec 2011   Dr. Mavis   wisdom teeth removal     Family History:  Family History  Problem Relation Age of Onset   Thyroid  disease Mother    Colon cancer Father    Breast cancer Maternal Aunt    Colon cancer Brother    Colon cancer Other        paternal and maternal grandfather   Meniere's disease Other     Anesthesia problems Neg Hx    Hypotension Neg Hx    Malignant hyperthermia Neg Hx    Pseudochol deficiency Neg Hx    Family Psychiatric  History:  None Reported   Social History:  Social History   Substance and Sexual Activity  Alcohol Use No   Comment: not since June 2012     Social History   Substance and Sexual Activity  Drug Use Yes   Types: Marijuana   Comment: patient denies any-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    Social History   Socioeconomic History   Marital status: Legally Separated    Spouse name: Not on file   Number of children: Not on file   Years of education: Not on file   Highest education level: Not on file  Occupational History   Not on file  Tobacco Use   Smoking status: Every Day    Current packs/day: 0.00    Average packs/day: 0.5 packs/day for 37.3 years (18.7 ttl pk-yrs)    Types: Cigarettes    Start date: 07/07/1981    Last attempt to quit: 11/05/2018    Years since quitting: 5.3   Smokeless tobacco: Never  Vaping Use   Vaping status: Never Used  Substance and Sexual Activity   Alcohol use: No    Comment: not since June 2012   Drug use: Yes    Types: Marijuana    Comment: patient denies any-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   Sexual activity: Never    Birth control/protection: Surgical  Other Topics Concern   Not on file  Social History Narrative   Not on file   Social Drivers of Health   Financial Resource Strain: Not on file  Food Insecurity: Food Insecurity Present (02/18/2024)   Hunger Vital Sign    Worried About Running Out of Food in the Last Year: Sometimes true    Ran Out of Food in the Last Year: Sometimes true  Transportation Needs: Unmet Transportation Needs (02/18/2024)   PRAPARE - Administrator, Civil Service (Medical): Yes    Lack of Transportation (Non-Medical): Yes  Physical Activity: Not on file  Stress: Not on file  Social Connections: Unknown (10/14/2021)   Received from [facility]   Social Connections    Do your friends and family support you?: Not on file    What agencies support you?: Not on file    Current Medications: Current Facility-Administered Medications  Medication Dose Route Frequency Provider Last Rate Last Admin   acetaminophen  (TYLENOL ) tablet 650 mg  650 mg Oral Q6H PRN White, Patrice L, NP   650 mg at 03/12/24 2214   alum & mag hydroxide-simeth (MAALOX/MYLANTA) 200-200-20 MG/5ML suspension 30 mL  30 mL Oral Q4H PRN White, Patrice L, NP   30 mL at 03/13/24 2220  benztropine  (COGENTIN ) tablet 1 mg  1 mg Oral BID White, Patrice L, NP   1 mg at 03/14/24 0827   divalproex  (DEPAKOTE ) DR tablet 750 mg  750 mg Oral Q12H Lynnette Barter, MD   750 mg at 03/14/24 0827   haloperidol  (HALDOL ) tablet 10 mg  10 mg Oral Q12H Ji, Andrew, MD   10 mg at 03/14/24 0827   hydrOXYzine  (ATARAX ) tablet 25 mg  25 mg Oral TID PRN White, Patrice L, NP   25 mg at 03/13/24 1524   LORazepam  (ATIVAN ) tablet 1 mg  1 mg Oral Q12H Lynnette Barter, MD   1 mg at 03/14/24 0827   magnesium  hydroxide (MILK OF MAGNESIA) suspension 30 mL  30 mL Oral Daily PRN White, Patrice L, NP       nicotine  polacrilex (NICORETTE ) gum 2 mg  2 mg Oral PRN Trudy Carwin, NP   2 mg at 03/06/24 9185   OLANZapine  (ZYPREXA ) injection 10 mg  10 mg Intramuscular TID PRN White, Patrice L, NP       OLANZapine  (ZYPREXA ) injection 5 mg  5 mg Intramuscular TID PRN White, Patrice L, NP       OLANZapine  zydis (ZYPREXA ) disintegrating tablet 10 mg  10 mg Oral Daily Stevens, Briana N, MD   10 mg at 03/14/24 0827   OLANZapine  zydis (ZYPREXA ) disintegrating tablet 15 mg  15 mg Oral QHS Mannie Ashley SAILOR, MD   15 mg at 03/13/24 2013   OLANZapine  zydis (ZYPREXA ) disintegrating tablet 5 mg  5 mg Oral TID PRN Teresa Jes L, NP   5 mg at 03/13/24 2221   ondansetron  (ZOFRAN -ODT) disintegrating tablet 4 mg  4 mg Oral Q8H PRN Lynnette Barter, MD   4 mg at 03/07/24 2205   traZODone  (DESYREL ) tablet 50 mg  50 mg Oral QHS  White, Patrice L, NP   50 mg at 03/13/24 2037    Lab Results:  No results found for this or any previous visit (from the past 48 hours).   Blood Alcohol level:  Lab Results  Component Value Date   [facility] <15 02/17/2024   ETH <15 01/14/2024    Metabolic Disorder Labs: Lab Results  Component Value Date   HGBA1C 5.1 01/18/2024   MPG 99.67 01/18/2024   MPG 105.41 05/11/2019   Lab Results  Component Value Date   PROLACTIN 24.1 (H) 05/11/2019   Lab Results  Component Value Date   CHOL 154 01/18/2024   TRIG 180 (H) 01/18/2024   HDL 49 01/18/2024   CHOLHDL 3.1 01/18/2024   VLDL 36 01/18/2024   LDLCALC 69 01/18/2024   LDLCALC 82 05/11/2019    Mental Status exam: Appearance: white female of slightly elevated BMI, remains disheveled and with poor dentition, seen reclining in bed, sleeping and rouses briefly for limited interview  Eye contact: poor Attitude towards examiner  minimally responsive to questions Psychomotor: no agitation or retardation Speech: reduced amount, infrequent one-word responses  Language: simplistic  Mood: fine Affect: restricted in range, odd  Thought content: denying SI, HI, not overtly expressing delusional content Thought Process: remains with disorganized/confused TP on limited exam Perception: denying AVH, not overtly RTIS on limited exam Insight: poor  Judgement: poor   Orientation: to self  Attention/Concentration: limited due to psychosis  Memory/Cognition: not formally assessed   Fund of Knowledge: Average    Musculoskeletal: Strength & Muscle Tone: within normal limits Gait & Station: normal Patient leans: N/A   Physical Exam HENT:     Head:  Normocephalic and atraumatic.  Pulmonary:     Effort: Pulmonary effort is normal.  Musculoskeletal:        General: Normal range of motion.  Neurological:     General: No focal deficit present.    ROS Blood pressure 93/78, pulse 78, temperature 97.7 F (36.5 C), resp. rate 16,  height 5' 6 (1.676 m), weight 80.7 kg, SpO2 98%. Body mass index is 28.71 kg/m.   Treatment Plan Summary: Daily contact with patient to assess and evaluate symptoms and progress in treatment and Medication management  Assessment Allison Bolton is a 53 yr old female who presented on 02/17/24 to [facility] ED with a chief complaint of anxiety. In the ED, she was found to be acutely psychotic with tangential speech, agitation, and delusional thought content. Patient was admitted involuntarily to [facility] on 8/14.  Although substance-induced psychosis has previously been documented, she additional presents with prior reported manic episodes. On this admission, she has presented with agitation and increased rate of speech, initially had significantly poor sleep and psychotic symptoms, disorganization of thoughts, behavior, concern for AVH, have been significant and unresponsive to antipsychotic medications. Given clear manic episode prior to hospitalization and during first days of hospitalization, working diagnosis is a bipolar 1 disorder. SAD-BT is also high on the differential given lingering significant psychotic symptoms that have not resolved. Numerous antipsychotic medications and mood stabilizers have been tried as noted below but she has remained with significant disorganization in thoughts and behavior, poor self care.   Staff have continued to documented disorganization in thoughts/behaviors, internal preoccupation and poor hygiene. Has been compliant with medications and in the last week or so has not required PRNs for agitation. She is very withdrawn and isolative to room. She remains with significant disorganization in thoughts and poor insight into her mental health concerns.  Although denying all concerns, including SI, HI and AVH, she presents as internally preoccupied and is not forthcoming with information. Remains disheveled with overall poor self care, although can shower if pushed to do  so by NS. Given no significant improvements despite over a week of dual antipsychotic medications at relatively high doses + mood stabilizing agent the plan remains transfer to a facility that offers ECT for higher level of care. OT did evaluate the patient as part of this process yesterday and noted no physical limitations but significant cognitive limitations requiring oversight on all IADLs and assistance with taking medications and some aspects of ADLs.   DSM-5 diagnoses: Bipolar 1 Disorder, current episode manic, severe, with psychotic features  -R/O SAD-BT given significant psychotic symptoms  History of substance induced psychotic disorder   Plan:  Psychiatric medications:  -Continue Haldol  10 mg q12 for psychosis and mood stability -Continue Zyprexa  10 mg AM & 15 mg QHS for psychosis and mood stability -Continue Depakote  DR 750 mg q12 for mood stability -Continue Ativan  1 mg q12 for agitation  -Continue Cogentin  1 mg BID for Drug Induced EPS -Continue Agitation Protocol: Zyprexa   PT/OT referral placed today, will f/u on recommendations   Nicotine  Dependence: -Continue Nicotine  Gum 2 mg PRN  Additional PRNs -Continue Zofran  4 mg q8 PRN nausea/vomiting -Continue PRN's: Tylenol , Maalox, Atarax , Milk of Magnesia, Trazodone    --  The risks/benefits/side-effects/alternatives to medications were discussed in detail with the patient and time was given for questions. The patient consents to medication trials.                -- Metabolic profile and EKG monitoring obtained  while on an atypical antipsychotic (BMI: 28.73 Lipid Panel: WNL except Trig: 180 HbgA1c: 5.1)              -- Encouraged patient to participate in unit milieu and in scheduled group therapies              -- Short Term Goals: Ability to identify changes in lifestyle to reduce recurrence of condition will improve, Ability to verbalize feelings will improve, Ability to disclose and discuss suicidal ideas, Ability to  demonstrate self-control will improve, Ability to identify and develop effective coping behaviors will improve, Ability to maintain clinical measurements within normal limits will improve, Compliance with prescribed medications will improve, and Ability to identify triggers associated with substance abuse/mental health issues will improve             -- Long Term Goals: Improvement in symptoms so as ready for discharge   Safety and Monitoring:             -- Involuntary admission to inpatient psychiatric unit for safety, stabilization and treatment             -- Daily contact with patient to assess and evaluate symptoms and progress in treatment             -- Patient's case to be discussed in multi-disciplinary team meeting             -- Observation Level : q15 minute checks             -- Vital signs:  q12 hours             -- Precautions: suicide, elopement, and assault  Discharge Planning:              -- Appreciate SW assistance with transfer to a facility that provides ECT given the patient requires a higher level of care.   Karleen Kaufmann, MD 03/14/2024, 2:45 PM

## 2024-03-14 NOTE — Progress Notes (Signed)
   03/14/24 0823  Psych Admission Type (Psych Patients Only)  Admission Status Voluntary  Psychosocial Assessment  Patient Complaints None  Eye Contact Poor  Facial Expression Flat  Affect Preoccupied  Speech Soft  Interaction Minimal  Motor Activity Fidgety  Appearance/Hygiene Poor hygiene;Disheveled  Behavior Characteristics Cooperative  Mood Preoccupied  Thought Process  Coherency Disorganized  Content Preoccupation  Delusions None reported or observed  Perception Derealization  Hallucination None reported or observed  Judgment Poor  Confusion None  Danger to Self  Current suicidal ideation? Denies  Danger to Others  Danger to Others None reported or observed

## 2024-03-14 NOTE — Group Note (Signed)
 LCSW Group Therapy Note   Group Date: 03/14/2024 Start Time: 1300 End Time: 1400   Participation:  did not attend  Type of Therapy:  Group Therapy  Topic:  Stronger Together:  Building Healthy Relationships  Objective:  To explore loneliness, boundaries, and safe ways to build relationships.  Goals: Recognize healthy vs. unhealthy relationships. Learn safe ways to connect with others. Strengthen communication and [employer].  Summary:  Participants discussed loneliness, healthy connections, and setting boundaries. They explored safe ways to meet people and shared personal experiences. Key insights were reinforced through discussion and quotes.  Therapeutic Modalities Used: Cognitive Behavioral Therapy (CBT) Elements - Identifying unhealthy relationship patterns, challenging negative thoughts about connection. Dialectical Behavior Therapy (DBT) Elements - Interpersonal effectiveness, setting and maintaining boundaries. Supportive Group Therapy - Peer discussion, shared experiences, and emotional validation.   Allison Bolton O Allison Bolton, LCSWA 03/14/2024  5:57 PM

## 2024-03-14 NOTE — Progress Notes (Signed)
   03/14/24 0823  Psych Admission Type (Psych Patients Only)  Admission Status Voluntary  Psychosocial Assessment  Patient Complaints None  Eye Contact Poor  Facial Expression Flat  Affect Preoccupied  Speech Soft  Interaction Minimal  Motor Activity Fidgety  Appearance/Hygiene Poor hygiene;Disheveled  Behavior Characteristics Cooperative  Mood Preoccupied  Thought Process  Coherency Disorganized  Content Preoccupation  Delusions None reported or observed  Perception Derealization  Hallucination None reported or observed  Judgment Poor  Confusion None  Danger to Self  Current suicidal ideation? Denies  Agreement Not to Harm Self Yes  Description of Agreement verbal  Danger to Others  Danger to Others None reported or observed

## 2024-03-14 NOTE — Plan of Care (Signed)
   Problem: Education: Goal: Knowledge of [facility] Education information/materials will improve Outcome: Progressing Goal: Emotional status will improve Outcome: Progressing Goal: Mental status will improve Outcome: Progressing Goal: Verbalization of understanding the information provided will improve Outcome: Progressing
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
## 2024-03-14 NOTE — Group Note (Signed)
 Date:  03/14/2024 Time:  8:45 PM  Group Topic/Focus:  Wrap-Up Group:   The focus of this group is to help patients review their daily goal of treatment and discuss progress on daily workbooks.    Participation Level:  Active  Participation Quality:  Appropriate  Affect:  Appropriate  Cognitive:  Appropriate  Insight: Appropriate  Engagement in Group:  Engaged  Modes of Intervention:  Education and Exploration  Additional Comments:  Patient attended and participated in group tonight.  She reports that the best thing that happened for her today was that she got lot of sleep.  Gwenn Chillington Dacosta 03/14/2024, 8:45 PM

## 2024-03-15 DIAGNOSIS — F312 Bipolar disorder, current episode manic severe with psychotic features: Secondary | ICD-10-CM | POA: Diagnosis not present

## 2024-03-15 MED ORDER — OLANZAPINE 5 MG PO TBDP
5.0000 mg | ORAL_TABLET | Freq: Every day | ORAL | Status: DC
  Administered 2024-03-16 – 2024-03-17 (×2): 5 mg via ORAL
  Filled 2024-03-15 (×2): qty 1

## 2024-03-15 MED ORDER — ZIPRASIDONE HCL 20 MG PO CAPS
20.0000 mg | ORAL_CAPSULE | Freq: Two times a day (BID) | ORAL | Status: DC
Start: 2024-03-16 — End: 2024-03-17
  Administered 2024-03-16 (×2): 20 mg via ORAL
  Filled 2024-03-15 (×2): qty 1

## 2024-03-15 MED ORDER — OLANZAPINE 10 MG PO TBDP
10.0000 mg | ORAL_TABLET | Freq: Every day | ORAL | Status: DC
  Administered 2024-03-15 – 2024-03-16 (×2): 10 mg via ORAL
  Filled 2024-03-15 (×2): qty 1

## 2024-03-15 NOTE — Progress Notes (Addendum)
 [facility] MD Progress Note  03/15/2024 11:23 AM Allison Bolton  MRN:  [id number]  Principal Problem: Bipolar affective disorder, current episode manic with psychotic symptoms (HCC) Diagnosis: Principal Problem:   Bipolar affective disorder, current episode manic with psychotic symptoms (HCC)   Total Time spent with patient:  I personally spent 25 minutes on the unit in direct patient care. The direct patient care time included face-to-face time with the patient, reviewing the patient's chart, communicating with other professionals, and coordinating care.   Identifying Information and brief psychiatric history:  BRIANI MAUL is a 53 yr old female with working psychiatric diagnoses of bipolar 1 disorder (vs SAD-BT) and a history of prior documented substance-induced psychosis. She does have a history of prior psychiatric hospitalizations with substance use driving psychotic symptoms, however on this admission has presented with clear manic and psychotic symptoms that have not cleared despite time and medications. She has one prior suicide Attempt (2007), and Prior Psychiatric Hospitalizations (last- [facility] 01/2024).  On this admission she presented to [facility] ED on 02/17/24  with a chief complaint of anxiety. In the ED, she was found to be acutely psychotic with tangential speech, agitation, and delusional thought content. Patient was admitted involuntarily to [facility] on 8/14. Since admission the patient has been notably unresponsive to antipsychotic medications - remaining acutely psychotic and plan has been for transfer to facility that provides ECT for treatment of refractory mania with psychosis.    Interval events: No prn medications given. Patient reportedly was up all night, sleeping during the day.   Interview today:  The patient continues to appear disorganized. She says that she has to go see her daughter but cannot provide coherent reasons why. She denies further complaints, no  SI/HI/AVH. I walked the patient to the medication window. She mumbles frequently.     Past Medical History:  Past Medical History:  Diagnosis Date   Anxiety    Arthritis    Asthma    Bipolar 1 disorder (HCC)    Diverticulosis    Dysrhythmia    sts I have heart palpitations   Fibromyalgia    H/O hiatal hernia    4   Headache(784.0)    High blood pressure    Meniere's disease    S/P colonoscopy    Dr. Golda 2010: few small diverticula at sigmoid. otherwise normal.    Shortness of breath     Past Surgical History:  Procedure Laterality Date   ABDOMINAL HYSTERECTOMY     APPENDECTOMY     CESAREAN SECTION     CHOLECYSTECTOMY     COLONOSCOPY N/A 12/25/2023   Procedure: COLONOSCOPY;  Surgeon: Eartha Angelia Sieving, MD;  Location: [facility];  Service: Gastroenterology;  Laterality: N/A;  11:30am, asa 1   exploratory laparoscopy     FOOT SURGERY     HEMORRHOID SURGERY     LAPAROSCOPIC APPENDECTOMY  02/19/2011   Procedure: APPENDECTOMY LAPAROSCOPIC;  Surgeon: Oneil DELENA Budge;  Location: [facility] ORS;  Service: General;  Laterality: N/A;   LAPAROSCOPY  02/19/2011   Procedure: LAPAROSCOPY DIAGNOSTIC;  Surgeon: Oneil DELENA Budge;  Location: [facility] ORS;  Service: General;  Laterality: N/A;   left ovarian removal     multiple hernia repairs     Right ovarian removal     TONSILLECTOMY     TONSILLECTOMY AND ADENOIDECTOMY     tubes in ears     UMBILICAL HERNIA REPAIR  Dec 2011   Dr. Budge   wisdom teeth removal  Family History:  Family History  Problem Relation Age of Onset   Thyroid  disease Mother    Colon cancer Father    Breast cancer Maternal Aunt    Colon cancer Brother    Colon cancer Other        paternal and maternal grandfather   Meniere's disease Other    Anesthesia problems Neg Hx    Hypotension Neg Hx    Malignant hyperthermia Neg Hx    Pseudochol deficiency Neg Hx    Family Psychiatric  History:  None Reported   Social History:  Social History    Substance and Sexual Activity  Alcohol Use No   Comment: not since June 2012     Social History   Substance and Sexual Activity  Drug Use Yes   Types: Marijuana   Comment: patient denies any-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    Social History   Socioeconomic History   Marital status: Legally Separated    Spouse name: Not on file   Number of children: Not on file   Years of education: Not on file   Highest education level: Not on file  Occupational History   Not on file  Tobacco Use   Smoking status: Every Day    Current packs/day: 0.00    Average packs/day: 0.5 packs/day for 37.3 years (18.7 ttl pk-yrs)    Types: Cigarettes    Start date: 07/07/1981    Last attempt to quit: 11/05/2018    Years since quitting: 5.3   Smokeless tobacco: Never  Vaping Use   Vaping status: Never Used  Substance and Sexual Activity   Alcohol use: No    Comment: not since June 2012   Drug use: Yes    Types: Marijuana    Comment: patient denies any-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   Sexual activity: Never    Birth control/protection: Surgical  Other Topics Concern   Not on file  Social History Narrative   Not on file   Social Drivers of Health   Financial Resource Strain: Not on file  Food Insecurity: Food Insecurity Present (02/18/2024)   Hunger Vital Sign    Worried About Running Out of Food in the Last Year: Sometimes true    Ran Out of Food in the Last Year: Sometimes true  Transportation Needs: Unmet Transportation Needs (02/18/2024)   PRAPARE - Administrator, Civil Service (Medical): Yes    Lack of Transportation (Non-Medical): Yes  Physical Activity: Not on file  Stress: Not on file  Social Connections: Unknown (10/14/2021)   Received from [facility]   Social Connections    Do your friends and family support you?: Not on file    What agencies support you?: Not on file    Current Medications: Current Facility-Administered  Medications  Medication Dose Route Frequency Provider Last Rate Last Admin   acetaminophen  (TYLENOL ) tablet 650 mg  650 mg Oral Q6H PRN White, Patrice L, NP   650 mg at 03/14/24 2054   alum & mag hydroxide-simeth (MAALOX/MYLANTA) 200-200-20 MG/5ML suspension 30 mL  30 mL Oral Q4H PRN White, Patrice L, NP   30 mL at 03/14/24 2316   benztropine  (COGENTIN ) tablet 1 mg  1 mg Oral BID White, Patrice L, NP   1 mg at 03/15/24 0854   divalproex  (DEPAKOTE ) DR tablet 750 mg  750 mg Oral Q12H Ji, Andrew, MD   750 mg at 03/15/24 [phone]  haloperidol  (HALDOL ) tablet 10 mg  10 mg Oral Q12H Lynnette Barter, MD   10 mg at 03/15/24 9145   hydrOXYzine  (ATARAX ) tablet 25 mg  25 mg Oral TID PRN White, Patrice L, NP   25 mg at 03/13/24 1524   LORazepam  (ATIVAN ) tablet 1 mg  1 mg Oral Q12H Lynnette Barter, MD   1 mg at 03/15/24 9145   magnesium  hydroxide (MILK OF MAGNESIA) suspension 30 mL  30 mL Oral Daily PRN White, Patrice L, NP       nicotine  polacrilex (NICORETTE ) gum 2 mg  2 mg Oral PRN Trudy Carwin, NP   2 mg at 03/06/24 9185   OLANZapine  (ZYPREXA ) injection 10 mg  10 mg Intramuscular TID PRN White, Patrice L, NP       OLANZapine  (ZYPREXA ) injection 5 mg  5 mg Intramuscular TID PRN White, Patrice L, NP       OLANZapine  zydis (ZYPREXA ) disintegrating tablet 10 mg  10 mg Oral Daily Stevens, Briana N, MD   10 mg at 03/15/24 9145   OLANZapine  zydis (ZYPREXA ) disintegrating tablet 15 mg  15 mg Oral QHS Stevens, Briana N, MD   15 mg at 03/14/24 2054   OLANZapine  zydis (ZYPREXA ) disintegrating tablet 5 mg  5 mg Oral TID PRN Teresa Jes L, NP   5 mg at 03/13/24 2221   ondansetron  (ZOFRAN -ODT) disintegrating tablet 4 mg  4 mg Oral Q8H PRN Lynnette Barter, MD   4 mg at 03/07/24 2205   traZODone  (DESYREL ) tablet 50 mg  50 mg Oral QHS White, Patrice L, NP   50 mg at 03/14/24 2054    Lab Results:  No results found for this or any previous visit (from the past 48 hours).   Blood Alcohol level:  Lab Results  Component Value Date    [facility] <15 02/17/2024   ETH <15 01/14/2024    Metabolic Disorder Labs: Lab Results  Component Value Date   HGBA1C 5.1 01/18/2024   MPG 99.67 01/18/2024   MPG 105.41 05/11/2019   Lab Results  Component Value Date   PROLACTIN 24.1 (H) 05/11/2019   Lab Results  Component Value Date   CHOL 154 01/18/2024   TRIG 180 (H) 01/18/2024   HDL 49 01/18/2024   CHOLHDL 3.1 01/18/2024   VLDL 36 01/18/2024   LDLCALC 69 01/18/2024   LDLCALC 82 05/11/2019    Mental Status exam: Appearance: white female of slightly elevated BMI, remains disheveled and with poor dentition, seen reclining in bed, sleeping and rouses briefly for limited interview  Eye contact: poor Attitude towards examiner  minimally responsive to questions Psychomotor: no agitation or retardation Speech: reduced amount, infrequent one-word responses  Language: simplistic  Mood: fine Affect: restricted in range, odd  Thought content: denying SI, HI, not overtly expressing delusional content Thought Process: remains with disorganized/confused TP on limited exam Perception: denying AVH, not overtly RTIS on limited exam Insight: poor  Judgement: poor   Orientation: to self  Attention/Concentration: limited due to psychosis  Memory/Cognition: not formally assessed   Fund of Knowledge: Average    Musculoskeletal: Strength & Muscle Tone: within normal limits Gait & Station: normal Patient leans: N/A    Physical Exam Constitutional:      Appearance: the patient is not toxic-appearing.  Pulmonary:     Effort: Pulmonary effort is normal.  Neurological:     General: No focal deficit present.     Mental Status: the patient is alert and oriented to person, place, and time.  Review of Systems  Respiratory:  Negative for shortness of breath.   Cardiovascular:  Negative for chest pain.  Gastrointestinal:  Negative for abdominal pain, constipation, diarrhea, nausea and vomiting.  Neurological:  Negative for headaches.    Blood pressure 91/77, pulse 78, temperature (!) 97.5 F (36.4 C), temperature source Oral, resp. rate 16, height 5' 6 (1.676 m), weight 80.7 kg, SpO2 97%. Body mass index is 28.71 kg/m.   Treatment Plan Summary: Daily contact with patient to assess and evaluate symptoms and progress in treatment and Medication management  Assessment Allison Bolton is a 53 yr old female who presented on 02/17/24 to [facility] ED with a chief complaint of anxiety. In the ED, she was found to be acutely psychotic with tangential speech, agitation, and delusional thought content. Patient was admitted involuntarily to [facility] on 8/14.  Although substance-induced psychosis has previously been documented, she additional presents with prior reported manic episodes. On this admission, she has presented with agitation and increased rate of speech, initially had significantly poor sleep and psychotic symptoms, disorganization of thoughts, behavior, concern for AVH, have been significant and unresponsive to antipsychotic medications. Given clear manic episode prior to hospitalization and during first days of hospitalization, working diagnosis is a bipolar 1 disorder. SAD-BT is also high on the differential given lingering significant psychotic symptoms that have not resolved. Numerous antipsychotic medications and mood stabilizers have been tried as noted below but she has remained with significant disorganization in thoughts and behavior, poor self care.   Staff have continued to documented disorganization in thoughts/behaviors, internal preoccupation and poor hygiene. Has been compliant with medications and in the last week or so has not required PRNs for agitation. She is very withdrawn and isolative to room. She remains with significant disorganization in thoughts and poor insight into her mental health concerns.  Although denying all concerns, including SI, HI and AVH, she presents as internally preoccupied and is not  forthcoming with information. Remains disheveled with overall poor self care, although can shower if pushed to do so by NS. Given no significant improvements despite over a week of dual antipsychotic medications at relatively high doses + mood stabilizing agent the plan remains transfer to a facility that offers ECT for higher level of care. OT did evaluate the patient as part of this process and noted no physical limitations but significant cognitive limitations requiring oversight on all IADLs and assistance with taking medications and some aspects of ADLs.   DSM-5 diagnoses: Bipolar 1 Disorder, current episode manic, severe, with psychotic features  -R/O SAD-BT given significant psychotic symptoms  History of substance induced psychotic disorder     Plan:  IVC to be renewed 9/9 - can update order once confirmed  Psychiatric medications:  -Continue Haldol  10 mg q12 for psychosis and mood stability Cross titrate from Zyprexa  to Geodon  -Decrease Zyprexa  from 10 mg AM / 15 mg at bedtime to 10/10 -Start Geodon  20 mg bid WC starting 9/10 -Continue Depakote  DR 750 mg q12 for mood stability -Continue Ativan  1 mg q12 for agitation  -Continue Cogentin  1 mg BID for Drug Induced EPS -Continue Agitation Protocol: Zyprexa    Nicotine  Dependence: -Continue Nicotine  Gum 2 mg PRN  Additional PRNs -Continue Zofran  4 mg q8 PRN nausea/vomiting -Continue PRN's: Tylenol , Maalox, Atarax , Milk of Magnesia, Trazodone    --  The risks/benefits/side-effects/alternatives to medications were discussed in detail with the patient and time was given for questions. The patient consents to medication trials.                --  Metabolic profile and EKG monitoring obtained while on an atypical antipsychotic (BMI: 28.73 Lipid Panel: WNL except Trig: 180 HbgA1c: 5.1)              -- Encouraged patient to participate in unit milieu and in scheduled group therapies              -- Short Term Goals: Ability to identify  changes in lifestyle to reduce recurrence of condition will improve, Ability to verbalize feelings will improve, Ability to disclose and discuss suicidal ideas, Ability to demonstrate self-control will improve, Ability to identify and develop effective coping behaviors will improve, Ability to maintain clinical measurements within normal limits will improve, Compliance with prescribed medications will improve, and Ability to identify triggers associated with substance abuse/mental health issues will improve             -- Long Term Goals: Improvement in symptoms so as ready for discharge   Safety and Monitoring:             -- Involuntary admission to inpatient psychiatric unit for safety, stabilization and treatment             -- Daily contact with patient to assess and evaluate symptoms and progress in treatment             -- Patient's case to be discussed in multi-disciplinary team meeting             -- Observation Level : q15 minute checks             -- Vital signs:  q12 hours             -- Precautions: suicide, elopement, and assault  Discharge Planning:              -- Appreciate SW assistance with transfer to a facility that provides ECT given the patient requires a higher level of care.   Karleen Kaufmann, MD 03/15/2024, 11:23 AM

## 2024-03-15 NOTE — Progress Notes (Signed)
   03/15/24 2030  Psych Admission Type (Psych Patients Only)  Admission Status Involuntary  Psychosocial Assessment  Patient Complaints None  Eye Contact Poor  Facial Expression Flat  Affect Inconsistent with thought content  Speech Soft  Interaction Minimal  Motor Activity Restless;Pacing  Appearance/Hygiene Disheveled;Bizarre;Improved  Behavior Characteristics Cooperative  Mood Preoccupied  Thought Process  Coherency Disorganized;Loose associations  Content Preoccupation  Delusions None reported or observed  Perception Derealization  Hallucination None reported or observed  Judgment Poor  Confusion None  Danger to Self  Current suicidal ideation?  (Denies)  Agreement Not to Harm Self Yes  Description of Agreement Notify Staff  Danger to Others  Danger to Others None reported or observed

## 2024-03-15 NOTE — Plan of Care (Signed)
   Problem: Education: Goal: Knowledge of [facility] Education information/materials will improve Outcome: Progressing Goal: Emotional status will improve Outcome: Progressing Goal: Mental status will improve Outcome: Progressing Goal: Verbalization of understanding the information provided will improve Outcome: Progressing

## 2024-03-15 NOTE — [facility] Group Notes (Signed)
 [facility] Group Notes:  (Nursing/MHT/Case Management/Adjunct)  Date:  03/15/2024  Time:  8:39 PM  Type of Therapy:  Wrap-up group  Participation Level:  Minimal  Participation Quality:  Resistant  Affect:  Flat  Cognitive:  Confused  Insight:  Limited  Engagement in Group:  Limited  Modes of Intervention:  Education  Summary of Progress/Problems: PT reported she had a good day. PT unable to answer any other questions about coping skills etc.   Grayce LITTIE Essex 03/15/2024, 8:39 PM

## 2024-03-15 NOTE — [facility] Group Notes (Addendum)
 Adult Psychoeducational Group Note  Date:  03/15/2024 Time:  7:43 PM  Group Topic/Focus:  Goals Group:   The focus of this group is to help patients establish daily goals to achieve during treatment and discuss how the patient can incorporate goal setting into their daily lives to aide in recovery. Orientation:   The focus of this group is to educate the patient on the purpose and policies of crisis stabilization and provide a format to answer questions about their admission.  The group details unit policies and expectations of patients while admitted.  Participation Level:  Did Not Attend  Participation Quality:    Affect:    Cognitive:    Insight:   Engagement in Group:    Modes of Intervention:    Additional Comments:    Allison Bolton O 03/15/2024, 7:43 PM

## 2024-03-15 NOTE — BH Assessment (Signed)
(  Sleep Hours) - 8.75 (Any PRNs that were needed, meds refused, or side effects to meds)- Tylenol  650 mg, Mylanta 30 mL (Any disturbances and when (visitation, over night)- None (Concerns raised by the patient)- Pt was assisted with ADL's/Shower  (SI/HI/AVH)- Denies/ Noted confusion

## 2024-03-15 NOTE — Progress Notes (Addendum)
 Collateral contact - re:  Electroconvulsive therapy (ECT)    [facility] [address], [place], [place] [address] Phone:  [phone]  Fax: [phone]   There are no beds available.  There is not much movement and beds available at this time.     [facility] Electroconvulsive therapy (ECT) [address] [place], [place], [place] [address] [phone], x [phone], ID # [id number], input call back number#. Fax: [phone]   Per their request, CSW refaxed all documents.      [facility] [address] [place], [place] [address] [phone] - ECT phone number [phone] - ECT fax [phone] - ECT fax   CSW called [phone] and was advised to call Intake team for the hospital:  [phone].    CSW left a voicemail.      [facility] [address], [place], [place] [address] [phone]   CSW was advised to call [phone].  The voicemail said to fax documents to [phone].       [facility] [address], [place], [place] [address] [phone]   CSW left a voicemail for Ms. Katheryn, ECT coordinator, is handing this [phone].    [facility] Medicaid (insurance) [phone]   CSW left a voicemail for patient's care manager, Elveria Lacks - care manager - [email or web address], [phone].  [employer] (insurance) - [place] , [phone].  CSW was advised to call [phone] regarding help related to looking for a hospital that could provide ECT.  CSW called and was advised to call [phone].  CSW was advised they don't provide transportation benefits.       Mounir Skipper, LCSWA 03/15/2024

## 2024-03-15 NOTE — Group Note (Signed)
 Recreation Therapy Group Note   Group Topic:Communication  Group Date: 03/15/2024 Start Time: 1040 End Time: 1110 Facilitators: Niccole Witthuhn-McCall, LRT,CTRS Location: [address] Dayroom   Group Topic: Communication, Problem Solving   Goal Area(s) Addresses:  Patient will effectively listen to complete activity.  Patient will identify communication skills used to make activity successful.  Patient will identify how skills used during activity can be used to reach post d/c goals.    Behavioral Response: Minimal   Intervention: Building surveyor Activity - Geometric pattern cards, pencils, blank paper    Activity: Geometric Drawings.  Three volunteers from the peer group will be shown an abstract picture with a particular arrangement of geometrical shapes.  Each round, one 'speaker' will describe the pattern, as accurately as possible without revealing the image to the group.  The remaining group members will listen and draw the picture to reflect how it is described to them. Patients with the role of 'listener' cannot ask clarifying questions but, may request that the speaker repeat a direction. Once the drawings are complete, the presenter will show the rest of the group the picture and compare how close each person came to drawing the picture. LRT will facilitate a post-activity discussion regarding effective communication and the importance of planning, listening, and asking for clarification in daily interactions with others.  Education: Environmental consultant, Active listening, Support systems, Discharge planning  Education Outcome: Acknowledges understanding/In group clarification offered/Needs additional education.     Affect/Mood: Flat and Drowsy   Participation Level: Minimal   Participation Quality: Independent   Behavior: Fidgety   Speech/Thought Process: Barely audible    Insight: Lacking   Judgement: Lacking    Modes of Intervention: Activity and  Problem-solving   Patient Response to Interventions:  Challenging    Education Outcome:  In group clarification offered    Clinical Observations/Individualized Feedback: Pt was fidgety and had a hard time sitting still and participating in activity. Pt talked in a mumble which made her hard to understand. Pt was unable to focus to participate.      Plan: Continue to engage patient in RT group sessions 2-3x/week.   Allison Bolton, LRT,CTRS  03/15/2024 12:58 PM

## 2024-03-15 NOTE — Plan of Care (Signed)
  Problem: Safety: Goal: Periods of time without injury will increase Outcome: Progressing   Problem: Coping: Goal: Ability to demonstrate self-control will improve Outcome: Progressing

## 2024-03-16 DIAGNOSIS — F312 Bipolar disorder, current episode manic severe with psychotic features: Secondary | ICD-10-CM | POA: Diagnosis not present

## 2024-03-16 NOTE — Progress Notes (Addendum)
 [facility] MD Progress Note  03/16/2024 1:24 PM Allison Bolton  MRN:  [id number]  Principal Problem: Bipolar affective disorder, current episode manic with psychotic symptoms (HCC) Diagnosis: Principal Problem:   Bipolar affective disorder, current episode manic with psychotic symptoms (HCC)   Total Time spent with patient:  I personally spent 25 minutes on the unit in direct patient care. The direct patient care time included face-to-face time with the patient, reviewing the patient's chart, communicating with other professionals, and coordinating care.   Identifying Information and brief psychiatric history:  Allison Bolton is a 53 yr old female with working psychiatric diagnoses of bipolar 1 disorder (vs SAD-BT) and a history of prior documented substance-induced psychosis. She does have a history of prior psychiatric hospitalizations with substance use driving psychotic symptoms, however on this admission has presented with clear manic and psychotic symptoms that have not cleared despite time and medications. She has one prior suicide Attempt (2007), and Prior Psychiatric Hospitalizations (last- [facility] 01/2024).  On this admission she presented to [facility] ED on 02/17/24  with a chief complaint of anxiety. In the ED, she was found to be acutely psychotic with tangential speech, agitation, and delusional thought content. Patient was admitted involuntarily to [facility] on 8/14. Since admission the patient has been notably unresponsive to antipsychotic medications - remaining acutely psychotic and plan has been for transfer to facility that provides ECT for treatment of refractory mania with psychosis.    Interval events: No prn agitation medication needed. Slept well.  Interview today:  Disorganized thought process. She denies SI/HI/AVH. She mumbles frequently about her daughter. Denies physical issues.     Past Medical History:  Past Medical History:  Diagnosis Date   Anxiety    Arthritis     Asthma    Bipolar 1 disorder (HCC)    Diverticulosis    Dysrhythmia    sts I have heart palpitations   Fibromyalgia    H/O hiatal hernia    4   Headache(784.0)    High blood pressure    Meniere's disease    S/P colonoscopy    Dr. Golda 2010: few small diverticula at sigmoid. otherwise normal.    Shortness of breath     Past Surgical History:  Procedure Laterality Date   ABDOMINAL HYSTERECTOMY     APPENDECTOMY     CESAREAN SECTION     CHOLECYSTECTOMY     COLONOSCOPY N/A 12/25/2023   Procedure: COLONOSCOPY;  Surgeon: Eartha Angelia Sieving, MD;  Location: [facility];  Service: Gastroenterology;  Laterality: N/A;  11:30am, asa 1   exploratory laparoscopy     FOOT SURGERY     HEMORRHOID SURGERY     LAPAROSCOPIC APPENDECTOMY  02/19/2011   Procedure: APPENDECTOMY LAPAROSCOPIC;  Surgeon: Oneil DELENA Budge;  Location: [facility] ORS;  Service: General;  Laterality: N/A;   LAPAROSCOPY  02/19/2011   Procedure: LAPAROSCOPY DIAGNOSTIC;  Surgeon: Oneil DELENA Budge;  Location: [facility] ORS;  Service: General;  Laterality: N/A;   left ovarian removal     multiple hernia repairs     Right ovarian removal     TONSILLECTOMY     TONSILLECTOMY AND ADENOIDECTOMY     tubes in ears     UMBILICAL HERNIA REPAIR  Dec 2011   Dr. Budge   wisdom teeth removal     Family History:  Family History  Problem Relation Age of Onset   Thyroid  disease Mother    Colon cancer Father    Breast cancer Maternal  Aunt    Colon cancer Brother    Colon cancer Other        paternal and maternal grandfather   Meniere's disease Other    Anesthesia problems Neg Hx    Hypotension Neg Hx    Malignant hyperthermia Neg Hx    Pseudochol deficiency Neg Hx    Family Psychiatric  History:  None Reported   Social History:  Social History   Substance and Sexual Activity  Alcohol Use No   Comment: not since June 2012     Social History   Substance and Sexual Activity  Drug Use Yes   Types: Marijuana   Comment:  patient denies any-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    Social History   Socioeconomic History   Marital status: Legally Separated    Spouse name: Not on file   Number of children: Not on file   Years of education: Not on file   Highest education level: Not on file  Occupational History   Not on file  Tobacco Use   Smoking status: Every Day    Current packs/day: 0.00    Average packs/day: 0.5 packs/day for 37.3 years (18.7 ttl pk-yrs)    Types: Cigarettes    Start date: 07/07/1981    Last attempt to quit: 11/05/2018    Years since quitting: 5.3   Smokeless tobacco: Never  Vaping Use   Vaping status: Never Used  Substance and Sexual Activity   Alcohol use: No    Comment: not since June 2012   Drug use: Yes    Types: Marijuana    Comment: patient denies any-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   Sexual activity: Never    Birth control/protection: Surgical  Other Topics Concern   Not on file  Social History Narrative   Not on file   Social Drivers of Health   Financial Resource Strain: Not on file  Food Insecurity: Food Insecurity Present (02/18/2024)   Hunger Vital Sign    Worried About Running Out of Food in the Last Year: Sometimes true    Ran Out of Food in the Last Year: Sometimes true  Transportation Needs: Unmet Transportation Needs (02/18/2024)   PRAPARE - Administrator, Civil Service (Medical): Yes    Lack of Transportation (Non-Medical): Yes  Physical Activity: Not on file  Stress: Not on file  Social Connections: Unknown (10/14/2021)   Received from [facility]   Social Connections    Do your friends and family support you?: Not on file    What agencies support you?: Not on file    Current Medications: Current Facility-Administered Medications  Medication Dose Route Frequency Provider Last Rate Last Admin   acetaminophen  (TYLENOL ) tablet 650 mg  650 mg Oral Q6H PRN White, Patrice L, NP   650 mg at 03/15/24 2056    alum & mag hydroxide-simeth (MAALOX/MYLANTA) 200-200-20 MG/5ML suspension 30 mL  30 mL Oral Q4H PRN White, Patrice L, NP   30 mL at 03/14/24 2316   benztropine  (COGENTIN ) tablet 1 mg  1 mg Oral BID White, Patrice L, NP   1 mg at 03/16/24 0845   divalproex  (DEPAKOTE ) DR tablet 750 mg  750 mg Oral Q12H Ji, Andrew, MD   750 mg at 03/16/24 0845   haloperidol  (HALDOL ) tablet 10 mg  10 mg Oral Q12H Lynnette Barter, MD   10 mg at 03/16/24 0845   hydrOXYzine  (ATARAX ) tablet 25 mg  25 mg Oral TID PRN White, Patrice L, NP   25 mg at 03/13/24 1524   LORazepam  (ATIVAN ) tablet 1 mg  1 mg Oral Q12H Lynnette Barter, MD   1 mg at 03/16/24 0845   magnesium  hydroxide (MILK OF MAGNESIA) suspension 30 mL  30 mL Oral Daily PRN White, Patrice L, NP       nicotine  polacrilex (NICORETTE ) gum 2 mg  2 mg Oral PRN Trudy Carwin, NP   2 mg at 03/06/24 9185   OLANZapine  (ZYPREXA ) injection 10 mg  10 mg Intramuscular TID PRN White, Patrice L, NP       OLANZapine  (ZYPREXA ) injection 5 mg  5 mg Intramuscular TID PRN White, Patrice L, NP       OLANZapine  zydis (ZYPREXA ) disintegrating tablet 10 mg  10 mg Oral QHS Marry Clamp, MD   10 mg at 03/15/24 2056   OLANZapine  zydis (ZYPREXA ) disintegrating tablet 5 mg  5 mg Oral TID PRN Teresa Jes L, NP   5 mg at 03/13/24 2221   OLANZapine  zydis (ZYPREXA ) disintegrating tablet 5 mg  5 mg Oral Daily Marry Clamp, MD   5 mg at 03/16/24 0845   ondansetron  (ZOFRAN -ODT) disintegrating tablet 4 mg  4 mg Oral Q8H PRN Lynnette Barter, MD   4 mg at 03/07/24 2205   traZODone  (DESYREL ) tablet 50 mg  50 mg Oral QHS White, Patrice L, NP   50 mg at 03/15/24 2056   ziprasidone  (GEODON ) capsule 20 mg  20 mg Oral BID WC Marry Clamp, MD   20 mg at 03/16/24 0845    Lab Results:  No results found for this or any previous visit (from the past 48 hours).   Blood Alcohol level:  Lab Results  Component Value Date   [facility] <15 02/17/2024   ETH <15 01/14/2024    Metabolic Disorder Labs: Lab Results   Component Value Date   HGBA1C 5.1 01/18/2024   MPG 99.67 01/18/2024   MPG 105.41 05/11/2019   Lab Results  Component Value Date   PROLACTIN 24.1 (H) 05/11/2019   Lab Results  Component Value Date   CHOL 154 01/18/2024   TRIG 180 (H) 01/18/2024   HDL 49 01/18/2024   CHOLHDL 3.1 01/18/2024   VLDL 36 01/18/2024   LDLCALC 69 01/18/2024   LDLCALC 82 05/11/2019    Mental Status exam: Appearance: white female of slightly elevated BMI, remains disheveled and with poor dentition, seen reclining in bed, sleeping and rouses briefly for limited interview  Eye contact: poor Attitude towards examiner  minimally responsive to questions Psychomotor: no agitation or retardation Speech: reduced amount, infrequent one-word responses  Language: simplistic  Mood: fine Affect: restricted in range, odd  Thought content: denying SI, HI, not overtly expressing delusional content Thought Process: remains with disorganized/confused TP on limited exam Perception: denying AVH, not overtly RTIS on limited exam Insight: poor  Judgement: poor   Orientation: to self  Attention/Concentration: limited due to psychosis  Memory/Cognition: not formally assessed   Fund of Knowledge: Average    Musculoskeletal: Strength & Muscle Tone: within normal limits Gait & Station: normal Patient leans: N/A    Physical Exam Constitutional:      Appearance: the patient is not toxic-appearing.  Pulmonary:     Effort: Pulmonary effort is normal.  Neurological:     General: No focal deficit present.     Mental Status: the patient is alert and oriented to person, place, and time.   Review of Systems  Respiratory:  Negative for shortness of breath.   Cardiovascular:  Negative for chest pain.  Gastrointestinal:  Negative for abdominal pain, constipation, diarrhea, nausea and vomiting.  Neurological:  Negative for headaches.   Blood pressure (!) 103/91, pulse 66, temperature (!) 97.5 F (36.4 C),  temperature source Oral, resp. rate 16, height 5' 6 (1.676 m), weight 80.7 kg, SpO2 97%. Body mass index is 28.71 kg/m.   Treatment Plan Summary: Daily contact with patient to assess and evaluate symptoms and progress in treatment and Medication management  Assessment BYRON TIPPING is a 53 yr old female who presented on 02/17/24 to [facility] ED with a chief complaint of anxiety. In the ED, she was found to be acutely psychotic with tangential speech, agitation, and delusional thought content. Patient was admitted involuntarily to [facility] on 8/14.  Although substance-induced psychosis has previously been documented, she additional presents with prior reported manic episodes. On this admission, she has presented with agitation and increased rate of speech, initially had significantly poor sleep and psychotic symptoms, disorganization of thoughts, behavior, concern for AVH, have been significant and unresponsive to antipsychotic medications. Given clear manic episode prior to hospitalization and during first days of hospitalization, working diagnosis is a bipolar 1 disorder. SAD-BT is also high on the differential given lingering significant psychotic symptoms that have not resolved. Numerous antipsychotic medications and mood stabilizers have been tried as noted below but she has remained with significant disorganization in thoughts and behavior, poor self care.   Staff have continued to documented disorganization in thoughts/behaviors, internal preoccupation and poor hygiene. Has been compliant with medications and in the last week or so has not required PRNs for agitation. She is very withdrawn and isolative to room. She remains with significant disorganization in thoughts and poor insight into her mental health concerns.  Although denying all concerns, including SI, HI and AVH, she presents as internally preoccupied and is not forthcoming with information. Remains disheveled with overall poor self  care, although can shower if pushed to do so by NS. Given no significant improvements despite over a week of dual antipsychotic medications at relatively high doses + mood stabilizing agent the plan remains transfer to a facility that offers ECT for higher level of care. OT did evaluate the patient as part of this process and noted no physical limitations but significant cognitive limitations requiring oversight on all IADLs and assistance with taking medications and some aspects of ADLs.   DSM-5 diagnoses: Bipolar 1 Disorder, current episode manic, severe, with psychotic features  -R/O SAD-BT given significant psychotic symptoms  History of substance induced psychotic disorder     Plan:  IVC to be renewed 9/9 - can update order once confirmed with LCSW Still not accepted at [facility] for ECT  Psychiatric medications:  -Continue Haldol  10 mg q12 for psychosis and mood stability Cross titrate from Zyprexa  to Geodon  -Continue Zyprexa  from 5 mg AM / 10 mg -Starting Geodon  20 mg bid WC -EKG 7/11 with NSR corrected Qt of 451. Will recheck after cross titration.  -Continue Depakote  DR 750 mg q12 for mood stability -Continue Ativan  1 mg q12 for agitation  -Continue Cogentin  1 mg BID for Drug Induced EPS -Continue Agitation Protocol: Zyprexa    Nicotine  Dependence: -Continue Nicotine  Gum 2 mg PRN  Additional PRNs -Continue Zofran  4 mg q8 PRN nausea/vomiting -Continue PRN's: Tylenol , Maalox, Atarax , Milk of Magnesia, Trazodone    --  The risks/benefits/side-effects/alternatives to medications were discussed in detail with the patient and time was given for  questions. The patient consents to medication trials.                -- Metabolic profile and EKG monitoring obtained while on an atypical antipsychotic (BMI: 28.73 Lipid Panel: WNL except Trig: 180 HbgA1c: 5.1)              -- Encouraged patient to participate in unit milieu and in scheduled group therapies              -- Short Term Goals:  Ability to identify changes in lifestyle to reduce recurrence of condition will improve, Ability to verbalize feelings will improve, Ability to disclose and discuss suicidal ideas, Ability to demonstrate self-control will improve, Ability to identify and develop effective coping behaviors will improve, Ability to maintain clinical measurements within normal limits will improve, Compliance with prescribed medications will improve, and Ability to identify triggers associated with substance abuse/mental health issues will improve             -- Long Term Goals: Improvement in symptoms so as ready for discharge   Safety and Monitoring:             -- Involuntary admission to inpatient psychiatric unit for safety, stabilization and treatment             -- Daily contact with patient to assess and evaluate symptoms and progress in treatment             -- Patient's case to be discussed in multi-disciplinary team meeting             -- Observation Level : q15 minute checks             -- Vital signs:  q12 hours             -- Precautions: suicide, elopement, and assault  Discharge Planning:              -- Appreciate SW assistance with transfer to a facility that provides ECT given the patient requires a higher level of care.   Karleen Kaufmann, MD 03/16/2024, 1:24 PM

## 2024-03-16 NOTE — Progress Notes (Addendum)
 Type of Note:  IVC Court Hearing    Patient was asleep and couldn't participate in the court hearing.  The IVC has been approved for an additional 7 days.   Niels Cranshaw, LCSWA 03/15/2024

## 2024-03-16 NOTE — BH Assessment (Signed)
(  Sleep Hours) - 8.25 (Any PRNs that were needed, meds refused, or side effects to meds)- Tylenol  650 mg PO (Any disturbances and when (visitation, over night)- None (Concerns raised by the patient)- None (SI/HI/AVH)- Denies with noted confusion

## 2024-03-16 NOTE — [facility] Group Notes (Signed)
 [facility] Group Notes:  (Nursing/MHT/Case Management/Adjunct)  Date:  03/16/2024  Time:  8:06 PM  Type of Therapy:  Wrap-up group  Participation Level:  Did Not Attend  Participation Quality:    Affect:    Cognitive:    Insight:    Engagement in Group:    Modes of Intervention:    Summary of Progress/Problems:PT refused to attend group.  Grayce LITTIE Essex 03/16/2024, 8:06 PM

## 2024-03-16 NOTE — Group Note (Signed)
 Recreation Therapy Group Note   Group Topic:Goal Setting  Group Date: 03/16/2024 Start Time: 1028 End Time: 1045 Facilitators: Prisma Decarlo-McCall, LRT,CTRS Location: [address] Dayroom   Group Topic: Goal Setting  Goal Area(s) Addresses:  Patient will participate in discussion of what a goal is. Patient will successfully identify goals they want to reach at different time frames.  Behavioral Response:   Intervention: Group Conversation, Worksheet  Activity: LRT and patients discussed what goals were. Patients were then given a worksheet were they identified goals they wanted to accomplish in a week, month, year and 5 years. Patient then had to identify any obstacles that would interfere with reaching those goals, what they would need to reach goals and what they can start doing now to work towards goals.  Education: Goal Setting  Education Outcome: Acknowledges education   Affect/Mood: N/A   Participation Level: Did not attend    Clinical Observations/Individualized Feedback:      Plan: Continue to engage patient in RT group sessions 2-3x/week.   Thea Holshouser-McCall, LRT,CTRS 03/16/2024 1:06 PM

## 2024-03-16 NOTE — Progress Notes (Signed)
 Collateral contact - re:  Electroconvulsive therapy (ECT)    [facility] [address], [place], [place] [address] Phone:  [phone] x [phone]  Fax: [phone]   [facility] Electroconvulsive therapy (ECT) [address] [place], [place], [place] [address] [phone], [phone], ID # [id number], input call back number#. Fax: [phone]  CSW was advised that there were no beds available at this time.  CSW was advised to fax all documents on Monday, 03/21/2024 at 8 AM.   [facility]) [address], [place], [place] [address] [phone] A referral was faxed.  CSW completed a verbal assessment.    Yara Tomkinson, LCSWA 03/16/2024

## 2024-03-16 NOTE — Plan of Care (Signed)
   Problem: Education: Goal: Knowledge of [facility] Education information/materials will improve Outcome: Progressing Goal: Emotional status will improve Outcome: Progressing Goal: Mental status will improve Outcome: Progressing Goal: Verbalization of understanding the information provided will improve Outcome: Progressing

## 2024-03-16 NOTE — Plan of Care (Addendum)
 Pt continues to require multiple prompts and assistance from staff to attend to her ADLs. Compliant with medications when offered without adverse drug reactions. Out of bed this evening, engaged with staff and peers briefly in milieu with bright affect at the time. Tolerates meals and fluids well. Safety maintained at Q 15 minutes intervals without outburst thus far this shift.   Problem: Activity: Goal: Interest or engagement in activities will improve Outcome: Progressing   Problem: Coping: Goal: Ability to demonstrate self-control will improve Outcome: Progressing

## 2024-03-17 DIAGNOSIS — F312 Bipolar disorder, current episode manic severe with psychotic features: Secondary | ICD-10-CM | POA: Diagnosis not present

## 2024-03-17 MED ORDER — OLANZAPINE 5 MG PO TBDP
5.0000 mg | ORAL_TABLET | Freq: Every day | ORAL | Status: DC
  Administered 2024-03-17: 5 mg via ORAL
  Filled 2024-03-17: qty 1

## 2024-03-17 MED ORDER — ZIPRASIDONE HCL 40 MG PO CAPS
40.0000 mg | ORAL_CAPSULE | Freq: Two times a day (BID) | ORAL | Status: DC
  Administered 2024-03-17 – 2024-03-21 (×9): 40 mg via ORAL
  Filled 2024-03-17 (×9): qty 1

## 2024-03-17 NOTE — Progress Notes (Signed)
 [facility] MD Progress Note  03/17/2024 10:04 AM Allison Bolton  MRN:  [id number]  Principal Problem: Bipolar affective disorder, current episode manic with psychotic symptoms (HCC) Diagnosis: Principal Problem:   Bipolar affective disorder, current episode manic with psychotic symptoms (HCC)   Total Time spent with patient:  I personally spent 25 minutes on the unit in direct patient care. The direct patient care time included face-to-face time with the patient, reviewing the patient's chart, communicating with other professionals, and coordinating care.   Identifying Information and brief psychiatric history:  Allison Bolton is a 53 yr old female with working psychiatric diagnoses of bipolar 1 disorder (vs SAD-BT) and a history of prior documented substance-induced psychosis. She does have a history of prior psychiatric hospitalizations with substance use driving psychotic symptoms, however on this admission has presented with clear manic and psychotic symptoms that have not cleared despite time and medications. She has one prior suicide Attempt (2007), and Prior Psychiatric Hospitalizations (last- [facility] 01/2024).  On this admission she presented to [facility] ED on 02/17/24  with a chief complaint of anxiety. In the ED, she was found to be acutely psychotic with tangential speech, agitation, and delusional thought content. Patient was admitted involuntarily to [facility] on 8/14. Since admission the patient has been notably unresponsive to antipsychotic medications - remaining acutely psychotic and plan has been for transfer to facility that provides ECT for treatment of refractory mania with psychosis.    Interval events: No prn agitation medication needed. Slept excessively.  Interview today:  Somewhat irritable this morning, though she does continue to engage throughout the interview.  She does not talk about delusional thought content or hallucinations.  She is overtly disorganized.  She  complies with a brief physical exam demonstrating no rigidity and fair hip strength..  She is able to walk independently to the medication window.     Past Medical History:  Past Medical History:  Diagnosis Date   Anxiety    Arthritis    Asthma    Bipolar 1 disorder (HCC)    Diverticulosis    Dysrhythmia    sts I have heart palpitations   Fibromyalgia    H/O hiatal hernia    4   Headache(784.0)    High blood pressure    Meniere's disease    S/P colonoscopy    Dr. Golda 2010: few small diverticula at sigmoid. otherwise normal.    Shortness of breath     Past Surgical History:  Procedure Laterality Date   ABDOMINAL HYSTERECTOMY     APPENDECTOMY     CESAREAN SECTION     CHOLECYSTECTOMY     COLONOSCOPY N/A 12/25/2023   Procedure: COLONOSCOPY;  Surgeon: Eartha Angelia Sieving, MD;  Location: [facility] END[facility] SUITE;  Service: Gastroenterology;  Laterality: N/A;  11:30am, asa 1   exploratory laparoscopy     FOOT SURGERY     HEMORRHOID SURGERY     LAPAROSCOPIC APPENDECTOMY  02/19/2011   Procedure: APPENDECTOMY LAPAROSCOPIC;  Surgeon: Oneil DELENA Budge;  Location: [facility] ORS;  Service: General;  Laterality: N/A;   LAPAROSCOPY  02/19/2011   Procedure: LAPAROSCOPY DIAGNOSTIC;  Surgeon: Oneil DELENA Budge;  Location: [facility] ORS;  Service: General;  Laterality: N/A;   left ovarian removal     multiple hernia repairs     Right ovarian removal     TONSILLECTOMY     TONSILLECTOMY AND ADENOIDECTOMY     tubes in ears     UMBILICAL HERNIA REPAIR  Dec 2011  Dr. Mavis   wisdom teeth removal     Family History:  Family History  Problem Relation Age of Onset   Thyroid  disease Mother    Colon cancer Father    Breast cancer Maternal Aunt    Colon cancer Brother    Colon cancer Other        paternal and maternal grandfather   Meniere's disease Other    Anesthesia problems Neg Hx    Hypotension Neg Hx    Malignant hyperthermia Neg Hx    Pseudochol deficiency Neg Hx    Family Psychiatric  History:   None Reported   Social History:  Social History   Substance and Sexual Activity  Alcohol Use No   Comment: not since June 2012     Social History   Substance and Sexual Activity  Drug Use Yes   Types: Marijuana   Comment: patient denies any-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    Social History   Socioeconomic History   Marital status: Legally Separated    Spouse name: Not on file   Number of children: Not on file   Years of education: Not on file   Highest education level: Not on file  Occupational History   Not on file  Tobacco Use   Smoking status: Every Day    Current packs/day: 0.00    Average packs/day: 0.5 packs/day for 37.3 years (18.7 ttl pk-yrs)    Types: Cigarettes    Start date: 07/07/1981    Last attempt to quit: 11/05/2018    Years since quitting: 5.3   Smokeless tobacco: Never  Vaping Use   Vaping status: Never Used  Substance and Sexual Activity   Alcohol use: No    Comment: not since June 2012   Drug use: Yes    Types: Marijuana    Comment: patient denies any-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   Sexual activity: Never    Birth control/protection: Surgical  Other Topics Concern   Not on file  Social History Narrative   Not on file   Social Drivers of Health   Financial Resource Strain: Not on file  Food Insecurity: Food Insecurity Present (02/18/2024)   Hunger Vital Sign    Worried About Running Out of Food in the Last Year: Sometimes true    Ran Out of Food in the Last Year: Sometimes true  Transportation Needs: Unmet Transportation Needs (02/18/2024)   PRAPARE - Administrator, Civil Service (Medical): Yes    Lack of Transportation (Non-Medical): Yes  Physical Activity: Not on file  Stress: Not on file  Social Connections: Unknown (10/14/2021)   Received from [facility]   Social Connections    Do your friends and family support you?: Not on file    What agencies support you?: Not on file     Current Medications: Current Facility-Administered Medications  Medication Dose Route Frequency Provider Last Rate Last Admin   acetaminophen  (TYLENOL ) tablet 650 mg  650 mg Oral Q6H PRN White, Patrice L, NP   650 mg at 03/15/24 2056   alum & mag hydroxide-simeth (MAALOX/MYLANTA) 200-200-20 MG/5ML suspension 30 mL  30 mL Oral Q4H PRN White, Patrice L, NP   30 mL at 03/14/24 2316   benztropine  (COGENTIN ) tablet 1 mg  1 mg Oral BID White, Patrice L, NP   1 mg at 03/17/24 0852   divalproex  (DEPAKOTE ) DR tablet 750 mg  750 mg Oral Q12H  Lynnette Barter, MD   750 mg at 03/17/24 9147   haloperidol  (HALDOL ) tablet 10 mg  10 mg Oral Q12H Lynnette Barter, MD   10 mg at 03/17/24 9147   hydrOXYzine  (ATARAX ) tablet 25 mg  25 mg Oral TID PRN White, Patrice L, NP   25 mg at 03/13/24 1524   LORazepam  (ATIVAN ) tablet 1 mg  1 mg Oral Q12H Lynnette Barter, MD   1 mg at 03/17/24 9147   magnesium  hydroxide (MILK OF MAGNESIA) suspension 30 mL  30 mL Oral Daily PRN White, Patrice L, NP       nicotine  polacrilex (NICORETTE ) gum 2 mg  2 mg Oral PRN Trudy Carwin, NP   2 mg at 03/06/24 9185   OLANZapine  (ZYPREXA ) injection 10 mg  10 mg Intramuscular TID PRN White, Patrice L, NP       OLANZapine  (ZYPREXA ) injection 5 mg  5 mg Intramuscular TID PRN White, Patrice L, NP       OLANZapine  zydis (ZYPREXA ) disintegrating tablet 5 mg  5 mg Oral TID PRN White, Patrice L, NP   5 mg at 03/13/24 2221   OLANZapine  zydis (ZYPREXA ) disintegrating tablet 5 mg  5 mg Oral Daily Marry Clamp, MD   5 mg at 03/17/24 9146   OLANZapine  zydis (ZYPREXA ) disintegrating tablet 5 mg  5 mg Oral QHS Marry Clamp, MD       ondansetron  (ZOFRAN -ODT) disintegrating tablet 4 mg  4 mg Oral Q8H PRN Lynnette Barter, MD   4 mg at 03/07/24 2205   traZODone  (DESYREL ) tablet 50 mg  50 mg Oral QHS White, Patrice L, NP   50 mg at 03/16/24 2041   ziprasidone  (GEODON ) capsule 40 mg  40 mg Oral BID WC Marry Clamp, MD   40 mg at 03/17/24 9147    Lab Results:  No results  found for this or any previous visit (from the past 48 hours).   Blood Alcohol level:  Lab Results  Component Value Date   [facility] <15 02/17/2024   ETH <15 01/14/2024    Metabolic Disorder Labs: Lab Results  Component Value Date   HGBA1C 5.1 01/18/2024   MPG 99.67 01/18/2024   MPG 105.41 05/11/2019   Lab Results  Component Value Date   PROLACTIN 24.1 (H) 05/11/2019   Lab Results  Component Value Date   CHOL 154 01/18/2024   TRIG 180 (H) 01/18/2024   HDL 49 01/18/2024   CHOLHDL 3.1 01/18/2024   VLDL 36 01/18/2024   LDLCALC 69 01/18/2024   LDLCALC 82 05/11/2019    Mental Status exam: Appearance: white female of slightly elevated BMI, remains disheveled and with poor dentition, seen reclining in bed, sleeping and rouses briefly for limited interview  Eye contact: poor Attitude towards examiner  minimally responsive to questions Psychomotor: no agitation or retardation Speech: reduced amount, infrequent one-word responses  Language: simplistic  Mood: fine Affect: restricted in range, odd  Thought content: denying SI, HI, not overtly expressing delusional content Thought Process: remains with disorganized/confused TP on limited exam Perception: denying AVH, not overtly RTIS on limited exam Insight: poor  Judgement: poor   Orientation: to self  Attention/Concentration: limited due to psychosis  Memory/Cognition: not formally assessed   Fund of Knowledge: Average    Musculoskeletal: Strength & Muscle Tone: within normal limits Gait & Station: normal Patient leans: N/A    Physical Exam Constitutional:      Appearance: the patient is not toxic-appearing.  Pulmonary:     Effort: Pulmonary effort  is normal.  Neurological:     General: No focal deficit present.     Mental Status: the patient is alert and oriented to person, place, and time.   Review of Systems  Respiratory:  Negative for shortness of breath.   Cardiovascular:  Negative for chest pain.   Gastrointestinal:  Negative for abdominal pain, constipation, diarrhea, nausea and vomiting.  Neurological:  Negative for headaches.   Blood pressure 103/89, pulse 75, temperature (!) 97.5 F (36.4 C), temperature source Oral, resp. rate 18, height 5' 6 (1.676 m), weight 80.7 kg, SpO2 97%. Body mass index is 28.71 kg/m.   Treatment Plan Summary: Daily contact with patient to assess and evaluate symptoms and progress in treatment and Medication management  Assessment Allison Bolton is a 53 yr old female who presented on 02/17/24 to [facility] ED with a chief complaint of anxiety. In the ED, she was found to be acutely psychotic with tangential speech, agitation, and delusional thought content. Patient was admitted involuntarily to [facility] on 8/14.  Although substance-induced psychosis has previously been documented, she additional presents with prior reported manic episodes. On this admission, she has presented with agitation and increased rate of speech, initially had significantly poor sleep and psychotic symptoms, disorganization of thoughts, behavior, concern for AVH, have been significant and unresponsive to antipsychotic medications. Given clear manic episode prior to hospitalization and during first days of hospitalization, working diagnosis is a bipolar 1 disorder. SAD-BT is also high on the differential given lingering significant psychotic symptoms that have not resolved. Numerous antipsychotic medications and mood stabilizers have been tried as noted below but she has remained with significant disorganization in thoughts and behavior, poor self care.   Staff have continued to documented disorganization in thoughts/behaviors, internal preoccupation and poor hygiene. Has been compliant with medications and in the last week or so has not required PRNs for agitation. She is very withdrawn and isolative to room. She remains with significant disorganization in thoughts and poor insight into her  mental health concerns.  Although denying all concerns, including SI, HI and AVH, she presents as internally preoccupied and is not forthcoming with information. Remains disheveled with overall poor self care, although can shower if pushed to do so by NS. Given no significant improvements despite over a week of dual antipsychotic medications at relatively high doses + mood stabilizing agent the plan remains transfer to a facility that offers ECT for higher level of care. OT did evaluate the patient as part of this process and noted no physical limitations but significant cognitive limitations requiring oversight on all IADLs and assistance with taking medications and some aspects of ADLs.   DSM-5 diagnoses: Bipolar 1 Disorder, current episode manic, severe, with psychotic features  -R/O SAD-BT given significant psychotic symptoms  History of substance induced psychotic disorder     Plan:  IVC to be renewed 9/9 - can update order once confirmed with LCSW Still not accepted at [facility] for ECT  Psychiatric medications:  -Continue Haldol  10 mg q12 for psychosis and mood stability Cross titrate from Zyprexa  to Geodon  - Decrease Zyprexa  to 5 mg twice daily - Increase Geodon  from 20 mg twice daily with food to 40 mg twice daily with food -EKG 7/11 with NSR corrected Qt of 451. Will recheck after cross titration.  -Continue Depakote  DR 750 mg q12 for mood stability -Continue Ativan  1 mg q12 for agitation  -Continue Cogentin  1 mg BID for Drug Induced EPS -Continue Agitation Protocol: Zyprexa   Nicotine  Dependence: -Continue Nicotine  Gum 2 mg PRN  Additional PRNs -Continue Zofran  4 mg q8 PRN nausea/vomiting -Continue PRN's: Tylenol , Maalox, Atarax , Milk of Magnesia, Trazodone    --  The risks/benefits/side-effects/alternatives to medications were discussed in detail with the patient and time was given for questions. The patient consents to medication trials.                -- Metabolic profile  and EKG monitoring obtained while on an atypical antipsychotic (BMI: 28.73 Lipid Panel: WNL except Trig: 180 HbgA1c: 5.1)              -- Encouraged patient to participate in unit milieu and in scheduled group therapies              -- Short Term Goals: Ability to identify changes in lifestyle to reduce recurrence of condition will improve, Ability to verbalize feelings will improve, Ability to disclose and discuss suicidal ideas, Ability to demonstrate self-control will improve, Ability to identify and develop effective coping behaviors will improve, Ability to maintain clinical measurements within normal limits will improve, Compliance with prescribed medications will improve, and Ability to identify triggers associated with substance abuse/mental health issues will improve             -- Long Term Goals: Improvement in symptoms so as ready for discharge   Safety and Monitoring:             -- Involuntary admission to inpatient psychiatric unit for safety, stabilization and treatment             -- Daily contact with patient to assess and evaluate symptoms and progress in treatment             -- Patient's case to be discussed in multi-disciplinary team meeting             -- Observation Level : q15 minute checks             -- Vital signs:  q12 hours             -- Precautions: suicide, elopement, and assault  Discharge Planning:              -- Appreciate SW assistance with transfer to a facility that provides ECT given the patient requires a higher level of care.   Karleen Kaufmann, MD 03/17/2024, 10:04 AM

## 2024-03-17 NOTE — Progress Notes (Signed)
   03/17/24 1000  Psych Admission Type (Psych Patients Only)  Admission Status Involuntary  Psychosocial Assessment  Patient Complaints None  Eye Contact Poor  Facial Expression Anxious  Affect Preoccupied  Speech Soft  Interaction Minimal  Motor Activity Fidgety;Restless  Appearance/Hygiene Disheveled  Behavior Characteristics Cooperative  Mood Preoccupied  Thought Process  Coherency Disorganized  Content Preoccupation  Delusions None reported or observed  Perception Derealization  Hallucination None reported or observed  Judgment Poor  Confusion None  Danger to Self  Current suicidal ideation? Denies  Description of Suicide Plan No Plan  Agreement Not to Harm Self Yes  Description of Agreement Verbal  Danger to Others  Danger to Others None reported or observed

## 2024-03-17 NOTE — Group Note (Signed)
 Recreation Therapy Group Note   Group Topic:Self-Esteem  Group Date: 03/17/2024 Start Time: 1010 End Time: 1034 Facilitators: Zoran Yankee-McCall, LRT,CTRS Location: [address] Dayroom   Group Topic: Self-Esteem  Goal Area(s) Addresses:  Patient will successfully identify positive attributes about themselves.  Patient will identify healthy ways to increase self-esteem. Patient will acknowledge benefit(s) of improved self-esteem.   Behavioral Response:   Intervention: Worksheet, Markers  Activity: Pictures of Me. LRT and patients discussed what identity is. Patients then identified at least one thing that identifies them. Patients were given a worksheet with four separate picture frame. In each frame, patients were to draw how they see themselves . Patients could represent parts of that identity through objects used in that role and symbols.  Education: Self-Esteem, Discharge Planning  Education Outcome: Acknowledges education/In group clarification offered/Needs additional education   Affect/Mood: N/A   Participation Level: Did not attend    Clinical Observations/Individualized Feedback:      Plan: Continue to engage patient in RT group sessions 2-3x/week.   Markes Shatswell-McCall, LRT,CTRS 03/17/2024 11:41 AM

## 2024-03-17 NOTE — Progress Notes (Signed)
(  Sleep Hours) -15 (Any PRNs that were needed, meds refused, or side effects to meds)- none (Any disturbances and when (visitation, over night)- none (Concerns raised by the patient)- none (SI/HI/AVH)- Denies

## 2024-03-17 NOTE — Plan of Care (Signed)
   Problem: Activity: Goal: Interest or engagement in activities will improve Outcome: Progressing   Problem: Coping: Goal: Ability to verbalize frustrations and anger appropriately will improve Outcome: Progressing   Problem: Safety: Goal: Periods of time without injury will increase Outcome: Progressing

## 2024-03-17 NOTE — Group Note (Signed)
 Date:  03/17/2024 Time:  10:45 AM  Group Topic/Focus:  Goals Group:   The focus of this group is to help patients establish daily goals to achieve during treatment and discuss how the patient can incorporate goal setting into their daily lives to aide in recovery. Orientation:   The focus of this group is to educate the patient on the purpose and policies of crisis stabilization and provide a format to answer questions about their admission.  The group details unit policies and expectations of patients while admitted.    Participation Level:  Did Not Attend  Participation Quality:    Affect:    Cognitive:    Insight:   Engagement in Group:    Modes of Intervention:    Additional Comments:    Ellouise Dama Molt 03/17/2024, 10:45 AM

## 2024-03-17 NOTE — Progress Notes (Signed)
(  Sleep Hours) -6.75  (Any PRNs that were needed, meds refused, or side effects to meds)- N/A  (Any disturbances and when (visitation, over night)-N/A  (Concerns raised by the patient)- I'm ready to go  (SI/HI/AVH)-denies

## 2024-03-17 NOTE — Progress Notes (Signed)
   03/16/24 2300  Psych Admission Type (Psych Patients Only)  Admission Status Involuntary  Psychosocial Assessment  Patient Complaints None  Eye Contact Poor  Facial Expression Anxious  Affect Preoccupied  Speech Soft  Interaction Minimal  Motor Activity Fidgety;Restless  Appearance/Hygiene Disheveled  Behavior Characteristics Cooperative  Mood Preoccupied  Thought Process  Coherency Disorganized  Content Preoccupation  Delusions None reported or observed  Perception Derealization  Hallucination None reported or observed  Judgment Poor  Confusion None  Danger to Self  Current suicidal ideation? Denies  Agreement Not to Harm Self Yes  Description of Agreement verbal  Danger to Others  Danger to Others None reported or observed

## 2024-03-17 NOTE — Plan of Care (Signed)
  Problem: Education: Goal: Mental status will improve Outcome: Progressing   

## 2024-03-17 NOTE — Progress Notes (Signed)
 Pt's medication and dosage verified with Dr. Pashayan .

## 2024-03-17 NOTE — Group Note (Signed)
 Date:  03/17/2024 Time:  8:33 PM  Group Topic/Focus:  Wrap-Up Group:   The focus of this group is to help patients review their daily goal of treatment and discuss progress on daily workbooks.    Participation Level:  Active  Participation Quality:  Appropriate  Affect:  Flat  Cognitive:  Oriented  Insight: Limited  Engagement in Group:  Engaged  Modes of Intervention:  Education and Exploration  Additional Comments:  Patient attended and participated in group tonight. She reported that the best part of her day was breakfast.  Bobak Oguinn Dacosta 03/17/2024, 8:33 PM

## 2024-03-17 NOTE — Progress Notes (Signed)
 During the 0330 safety check, patient was heard moaning and moving her legs around while lying in bed. Pt reported having to go to the bathroom. Pt continued to moan and shake her legs. Pt lowered herself onto her knees getting out of the bed and then slid her body to a resting position on her forearms. Pt reports no pain and this writer asked if she needed assistance. Pt sat back up on bed and walked with a steady gate to the bathroom with this writer by her side. Pt returned back to bed without moaning and without leg kicking.

## 2024-03-17 NOTE — Plan of Care (Signed)
   Problem: Education: Goal: Emotional status will improve Outcome: Progressing Goal: Mental status will improve Outcome: Progressing   Problem: Activity: Goal: Sleeping patterns will improve Outcome: Progressing   Problem: Safety: Goal: Periods of time without injury will increase Outcome: Progressing

## 2024-03-18 ENCOUNTER — Encounter (HOSPITAL_COMMUNITY): Payer: Self-pay

## 2024-03-18 DIAGNOSIS — F312 Bipolar disorder, current episode manic severe with psychotic features: Secondary | ICD-10-CM | POA: Diagnosis not present

## 2024-03-18 NOTE — Progress Notes (Signed)
   03/18/24 0920  Psych Admission Type (Psych Patients Only)  Admission Status Involuntary  Psychosocial Assessment  Patient Complaints None  Eye Contact Poor  Facial Expression Anxious  Affect Preoccupied  Speech Slow  Interaction Minimal  Motor Activity Fidgety  Appearance/Hygiene Disheveled;Poor hygiene  Behavior Characteristics Cooperative  Mood Preoccupied  Thought Process  Coherency Circumstantial  Content Preoccupation  Delusions None reported or observed  Perception Derealization  Hallucination None reported or observed  Judgment Impaired  Confusion None  Danger to Self  Current suicidal ideation? Denies  Agreement Not to Harm Self Yes  Description of Agreement verbal  Danger to Others  Danger to Others None reported or observed

## 2024-03-18 NOTE — Group Note (Signed)
 Recreation Therapy Group Note   Group Topic:Leisure Education  Group Date: 03/18/2024 Start Time: 1019 End Time: 1108 Facilitators: Seferina Brokaw-McCall, LRT,CTRS Location: [address] Dayroom   Group Topic: Leisure Education   Goal Area(s) Addresses:  Patient will successfully demonstrate knowledge of leisure and recreation interests. Patient will successfully identify benefits of leisure participation.  Patient will verbalize appropriate recreation activities to use post discharge.   Behavioral Response:    Intervention: Guess the Lyric   Activity: LRT facilitated a competitive group game that had patients guess the missing lyric to songs presented. Patients had 6 categories (Pop, Rock, R&B, Dance, Indie and Hip Hop) to choose from. Patient would spin the flicker and whatever category the spinner landed on, the patient would be read a line from that song. If they had the correct answer, they kept the card. If they made the wrong answer, everyone else got a chance to steal the point. The person with the most cards at the end, was the winner.   Education:  Leisure Education, Publishing copy Outcome: Acknowledges education   Affect/Mood: N/A   Participation Level: Did not attend    Clinical Observations/Individualized Feedback:      Plan: Continue to engage patient in RT group sessions 2-3x/week.   Allison Bolton, LRT,CTRS 03/18/2024 1:20 PM

## 2024-03-18 NOTE — Progress Notes (Addendum)
 Collateral contact - re:  Electroconvulsive therapy (ECT)   Doyal from [facility], [phone]  Patient is on the waiting list for ECT.   Elveria Lacks, Care Coordinator with [facility], [phone] 03/17/2024 GLENWOOD Lacks returned CSW's phone and left a voicemail. 03/18/2024 - CSW left a voicemail.   [facility] [address], [place], [place] [address] Phone:  [phone]  Fax: [phone]  They don't have any beds available.  CSW faxed updated documents.   [facility] [address], [place], [place] [address] [phone] - ECT phone number [phone] - ECT fax [phone] - ECT fax   CSW was advised that they have a waiting list.  CSW was advised to call [phone].  CSW called and learned that they don't have any beds available.  CSW faxed updated documents to [phone].     [facility] [address] [place], [place] [address] [phone]   CSW left a voicemail for Ms. Katheryn, ECT coordinator, is handing this [phone].  The receptionist will also send a message to Ms. Katheryn that CSW needed to speak with her.    [facility],  [address], [place], [place] [address], [phone]  CSW was transferred to [facility].  The phone rang but there was no answer.  Voicemail wasn't an option.  CSW called again and was advised to call inpatient ECT [phone].  The woman advised they don't do it on her floor.  CSW gave her contact information and a Child psychotherapist will call her back.    Erial Fikes, LCSWA 03/18/2024

## 2024-03-18 NOTE — [facility] Group Notes (Signed)
 Spirituality Group   Description: Participant directed exploration of values, beliefs and meaning   Following a brief framework of chaplain's role and ground rules of group behavior, participants are invited to share concerns or questions that engage spiritual life. Emphasis placed on common themes and shared experiences and ways to make meaning and clarify living into one's values.   Theory/Process/Goal: Utilize the theoretical framework of group therapy established by Celena Kite, Relational Cultural Theory and Rogerian approaches to facilitate relational empathy and use of the "here and now" to foster reflection, self-awareness, and sharing.   Observations: Allison Bolton made effort to join group near end of meeting time. Was receiving care from a peer to help with her hair care. This chaplain greeted her and appreciated her willingness to join in/being present as able.  Orean Giarratano L. Delores HERO.Div

## 2024-03-18 NOTE — Progress Notes (Signed)
 [facility] MD Progress Note  03/18/2024 2:53 PM Allison Bolton  MRN:  [id number] Subjective:   Allison Bolton is a 53 yr old female who presented on 02/17/24 to [facility] ED with a chief complaint of anxiety. In the ED, she was found to be acutely psychotic with tangential speech, agitation, and delusional thought content. Patient was admitted involuntarily to [facility] on 8/14.  PPHx is significant for Substance-Induced Psychosis, Bipolar I Disorder, Stimulant Use Disorder, GAD, Suicide Attempt (2007), and Prior Psychiatric Hospitalizations (last- [facility] 01/2024).   Case was discussed in the multidisciplinary team. MAR was reviewed and patient was compliant with medications.  Yesterday she received PRN Maalox.   Psychiatric Team made the following recommendations yesterday: - Decrease Zyprexa  to 5 mg twice daily - Increase Geodon  from 20 mg twice daily with food to 40 mg twice daily with food -Pursue transfer for ECT    On interview today she is minimally interactive.  She mainly answer yes or no but otherwise does not provide any more answer.  She reports her sleep and appetite are good.  She reports no SI, HI, or AVH.  She reports no other concerns at present.   Principal Problem: Bipolar affective disorder, current episode manic with psychotic symptoms (HCC) Diagnosis: Principal Problem:   Bipolar affective disorder, current episode manic with psychotic symptoms (HCC)  Total Time spent with patient:  I personally spent 35 minutes on the unit in direct patient care. The direct patient care time included face-to-face time with the patient, reviewing the patient's chart, communicating with other professionals, and coordinating care.    Past Psychiatric History:  Substance-Induced Psychosis, Bipolar I Disorder, Stimulant Use Disorder, GAD, Suicide Attempt (2007), and Prior Psychiatric Hospitalizations (last- [facility] 01/2024).  Past Medical History:  Past Medical History:  Diagnosis Date    Anxiety    Arthritis    Asthma    Bipolar 1 disorder (HCC)    Diverticulosis    Dysrhythmia    sts I have heart palpitations   Fibromyalgia    H/O hiatal hernia    4   Headache(784.0)    High blood pressure    Meniere's disease    S/P colonoscopy    Dr. Golda 2010: few small diverticula at sigmoid. otherwise normal.    Shortness of breath     Past Surgical History:  Procedure Laterality Date   ABDOMINAL HYSTERECTOMY     APPENDECTOMY     CESAREAN SECTION     CHOLECYSTECTOMY     COLONOSCOPY N/A 12/25/2023   Procedure: COLONOSCOPY;  Surgeon: Eartha Angelia Sieving, MD;  Location: [facility];  Service: Gastroenterology;  Laterality: N/A;  11:30am, asa 1   exploratory laparoscopy     FOOT SURGERY     HEMORRHOID SURGERY     LAPAROSCOPIC APPENDECTOMY  02/19/2011   Procedure: APPENDECTOMY LAPAROSCOPIC;  Surgeon: Oneil DELENA Budge;  Location: [facility] ORS;  Service: General;  Laterality: N/A;   LAPAROSCOPY  02/19/2011   Procedure: LAPAROSCOPY DIAGNOSTIC;  Surgeon: Oneil DELENA Budge;  Location: [facility] ORS;  Service: General;  Laterality: N/A;   left ovarian removal     multiple hernia repairs     Right ovarian removal     TONSILLECTOMY     TONSILLECTOMY AND ADENOIDECTOMY     tubes in ears     UMBILICAL HERNIA REPAIR  Dec 2011   Dr. Budge   wisdom teeth removal     Family History:  Family History  Problem Relation Age of Onset  Thyroid  disease Mother    Colon cancer Father    Breast cancer Maternal Aunt    Colon cancer Brother    Colon cancer Other        paternal and maternal grandfather   Meniere's disease Other    Anesthesia problems Neg Hx    Hypotension Neg Hx    Malignant hyperthermia Neg Hx    Pseudochol deficiency Neg Hx    Family Psychiatric  History:  None Reported   Social History:  Social History   Substance and Sexual Activity  Alcohol Use No   Comment: not since June 2012     Social History   Substance and Sexual Activity  Drug Use Yes   Types:  Marijuana   Comment: patient denies any-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    Social History   Socioeconomic History   Marital status: Legally Separated    Spouse name: Not on file   Number of children: Not on file   Years of education: Not on file   Highest education level: Not on file  Occupational History   Not on file  Tobacco Use   Smoking status: Every Day    Current packs/day: 0.00    Average packs/day: 0.5 packs/day for 37.3 years (18.7 ttl pk-yrs)    Types: Cigarettes    Start date: 07/07/1981    Last attempt to quit: 11/05/2018    Years since quitting: 5.3   Smokeless tobacco: Never  Vaping Use   Vaping status: Never Used  Substance and Sexual Activity   Alcohol use: No    Comment: not since June 2012   Drug use: Yes    Types: Marijuana    Comment: patient denies any-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   Sexual activity: Never    Birth control/protection: Surgical  Other Topics Concern   Not on file  Social History Narrative   Not on file   Social Drivers of Health   Financial Resource Strain: Not on file  Food Insecurity: Food Insecurity Present (02/18/2024)   Hunger Vital Sign    Worried About Running Out of Food in the Last Year: Sometimes true    Ran Out of Food in the Last Year: Sometimes true  Transportation Needs: Unmet Transportation Needs (02/18/2024)   PRAPARE - Administrator, Civil Service (Medical): Yes    Lack of Transportation (Non-Medical): Yes  Physical Activity: Not on file  Stress: Not on file  Social Connections: Unknown (10/14/2021)   Received from [facility]   Social Connections    Do your friends and family support you?: Not on file    What agencies support you?: Not on file   Additional Social History:                         Sleep: Good Estimated Sleeping Duration (Last 24 Hours): 4.75-5.75 hours  Appetite:  Good  Current Medications: Current Facility-Administered  Medications  Medication Dose Route Frequency Provider Last Rate Last Admin   acetaminophen  (TYLENOL ) tablet 650 mg  650 mg Oral Q6H PRN White, Patrice L, NP   650 mg at 03/15/24 2056   alum & mag hydroxide-simeth (MAALOX/MYLANTA) 200-200-20 MG/5ML suspension 30 mL  30 mL Oral Q4H PRN White, Patrice L, NP   30 mL at 03/17/24 2128   benztropine  (COGENTIN ) tablet 1 mg  1 mg Oral BID White, Patrice L, NP   1  mg at 03/18/24 0849   divalproex  (DEPAKOTE ) DR tablet 750 mg  750 mg Oral Q12H Lynnette Barter, MD   750 mg at 03/18/24 0849   haloperidol  (HALDOL ) tablet 10 mg  10 mg Oral Q12H Ji, Andrew, MD   10 mg at 03/18/24 0849   hydrOXYzine  (ATARAX ) tablet 25 mg  25 mg Oral TID PRN White, Patrice L, NP   25 mg at 03/13/24 1524   LORazepam  (ATIVAN ) tablet 1 mg  1 mg Oral Q12H Lynnette Barter, MD   1 mg at 03/18/24 0849   magnesium  hydroxide (MILK OF MAGNESIA) suspension 30 mL  30 mL Oral Daily PRN White, Patrice L, NP       nicotine  polacrilex (NICORETTE ) gum 2 mg  2 mg Oral PRN Trudy Carwin, NP   2 mg at 03/06/24 9185   OLANZapine  (ZYPREXA ) injection 10 mg  10 mg Intramuscular TID PRN White, Patrice L, NP       OLANZapine  (ZYPREXA ) injection 5 mg  5 mg Intramuscular TID PRN White, Patrice L, NP       OLANZapine  zydis (ZYPREXA ) disintegrating tablet 5 mg  5 mg Oral TID PRN White, Patrice L, NP   5 mg at 03/13/24 2221   ondansetron  (ZOFRAN -ODT) disintegrating tablet 4 mg  4 mg Oral Q8H PRN Lynnette Barter, MD   4 mg at 03/07/24 2205   traZODone  (DESYREL ) tablet 50 mg  50 mg Oral QHS White, Patrice L, NP   50 mg at 03/17/24 2027   ziprasidone  (GEODON ) capsule 40 mg  40 mg Oral BID WC Marry Clamp, MD   40 mg at 03/18/24 0849    Lab Results:  No results found for this or any previous visit (from the past 48 hours).   Blood Alcohol level:  Lab Results  Component Value Date   [facility] <15 02/17/2024   ETH <15 01/14/2024    Metabolic Disorder Labs: Lab Results  Component Value Date   HGBA1C 5.1 01/18/2024   MPG  99.67 01/18/2024   MPG 105.41 05/11/2019   Lab Results  Component Value Date   PROLACTIN 24.1 (H) 05/11/2019   Lab Results  Component Value Date   CHOL 154 01/18/2024   TRIG 180 (H) 01/18/2024   HDL 49 01/18/2024   CHOLHDL 3.1 01/18/2024   VLDL 36 01/18/2024   LDLCALC 69 01/18/2024   LDLCALC 82 05/11/2019    Physical Findings: AIMS:  ,  ,  ,  ,  ,  ,   CIWA:    COWS:     Musculoskeletal: Strength & Muscle Tone: within normal limits Gait & Station: normal Patient leans: N/A  Psychiatric Specialty Exam:  Presentation  General Appearance:  Disheveled  Eye Contact: None  Speech: Slow; Slurred; Garbled  Speech Volume: Decreased  Handedness:No data recorded  Mood and Affect  Mood: -- (fine)  Affect: Constricted; Flat   Thought Process  Thought Processes: Disorganized  Descriptions of Associations:Loose  Orientation:Partial  Thought Content:Delusions  History of Schizophrenia/Schizoaffective disorder:No  Duration of Psychotic Symptoms:Greater than six months  Hallucinations:Hallucinations: None   Ideas of Reference:None  Suicidal Thoughts:Suicidal Thoughts: No   Homicidal Thoughts:Homicidal Thoughts: No    Sensorium  Memory: Immediate Poor  Judgment: Impaired  Insight: Lacking   Executive Functions  Concentration: Poor  Attention Span: Poor  Recall: Poor  Fund of Knowledge: Fair  Language: Fair   Psychomotor Activity  Psychomotor Activity:Psychomotor Activity: Decreased    Assets  Assets: Resilience   Sleep  Sleep:Sleep: Good  Physical Exam: Physical Exam Vitals and nursing note reviewed.  Constitutional:      General: She is not in acute distress.    Appearance: She is not toxic-appearing.  HENT:     Head: Normocephalic and atraumatic.  Pulmonary:     Effort: Pulmonary effort is normal.  Neurological:     Mental Status: She is alert.    Review of Systems  Respiratory:  Negative for  cough and shortness of breath.   Cardiovascular:  Negative for chest pain.  Gastrointestinal:  Negative for abdominal pain, constipation, diarrhea, nausea and vomiting.  Neurological:  Negative for dizziness, weakness and headaches.  Psychiatric/Behavioral:  Negative for depression, hallucinations and suicidal ideas. The patient is not nervous/anxious.    Blood pressure 113/87, pulse 85, temperature 97.7 F (36.5 C), temperature source Oral, resp. rate 20, height 5' 6 (1.676 m), weight 80.7 kg, SpO2 100%. Body mass index is 28.71 kg/m.   Treatment Plan Summary: Daily contact with patient to assess and evaluate symptoms and progress in treatment and Medication management  Allison Bolton is a 53 yr old female who presented on 02/17/24 to [facility] ED with a chief complaint of anxiety. In the ED, she was found to be acutely psychotic with tangential speech, agitation, and delusional thought content. Patient was admitted involuntarily to [facility] on 8/14.  PPHx is significant for Substance-Induced Psychosis, Bipolar I Disorder, Stimulant Use Disorder, GAD, Suicide Attempt (2007), and Prior Psychiatric Hospitalizations (last- [facility] 01/2024).    Allison Bolton is not attending to daily cares requiring nursing to prompt her many times to get in the shower.  She is minimally eating.  We have stopped the Zyprexa  today and will plan to further increase her Geodon  tomorrow.  She needs transfer to a facility that can provide ECT and we are continuing to contact facilities to do this.   Substance-induced psychosis  Bipolar disorder: -Continue Haldol  10 mg q12 for psychosis and mood stability -Stop Zyprexa  today -Continue Geodon  40 mg BID for psychosis and mood stability -Continue Depakote  DR 750 mg q12 for mood stability -Continue Ativan  1 mg q12 for agitation  -Continue Cogentin  1 mg BID for Drug Induced EPS -Continue Agitation Protocol: Zyprexa    Nicotine  Dependence: -Continue Nicotine  Gum 2 mg  PRN   -Continue Zofran  4 mg q8 PRN nausea/vomiting -Continue PRN's: Tylenol , Maalox, Atarax , Milk of Magnesia, Trazodone    --  The risks/benefits/side-effects/alternatives to medications were discussed in detail with the patient and time was given for questions. The patient consents to medication trials.                -- Metabolic profile and EKG monitoring obtained while on an atypical antipsychotic (BMI: 28.73 Lipid Panel: WNL except Trig: 180 HbgA1c: 5.1)              -- Encouraged patient to participate in unit milieu and in scheduled group therapies              -- Short Term Goals: Ability to identify changes in lifestyle to reduce recurrence of condition will improve, Ability to verbalize feelings will improve, Ability to disclose and discuss suicidal ideas, Ability to demonstrate self-control will improve, Ability to identify and develop effective coping behaviors will improve, Ability to maintain clinical measurements within normal limits will improve, Compliance with prescribed medications will improve, and Ability to identify triggers associated with substance abuse/mental health issues will improve             -- Long Term  Goals: Improvement in symptoms so as ready for discharge   Safety and Monitoring:             -- Involuntary admission to inpatient psychiatric unit for safety, stabilization and treatment             -- Daily contact with patient to assess and evaluate symptoms and progress in treatment             -- Patient's case to be discussed in multi-disciplinary team meeting             -- Observation Level : q15 minute checks             -- Vital signs:  q12 hours             -- Precautions: suicide, elopement, and assault  Discharge Planning:              -- Social work and case management to assist with discharge planning and identification of hospital follow-up needs prior to discharge             -- Estimated LOS: 3-7 more days             -- Discharge Concerns:  Need to establish a safety plan; Medication compliance and effectiveness             -- Discharge Goals: Return home with outpatient referrals for mental health follow-up including medication management/psychotherapy   Allison GORMAN Rosser, DO 03/18/2024, 2:53 PM
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
## 2024-03-18 NOTE — Group Note (Signed)
 Date:  03/18/2024 Time:  8:48 PM  Group Topic/Focus:  Wrap-Up Group:   The focus of this group is to help patients review their daily goal of treatment and discuss progress on daily workbooks.    Participation Level:  Active  Participation Quality:  Appropriate  Affect:  Appropriate  Cognitive:  Appropriate  Insight: Appropriate  Engagement in Group:  Engaged  Modes of Intervention:  Education  Additional Comments:  Patient attended and participated in group tonight.  She reports that she did not make a goal for herself buts she did have her hair done.  Anay Rathe Dacosta 03/18/2024, 8:48 PM

## 2024-03-18 NOTE — Progress Notes (Signed)
   03/18/24 2048  Psych Admission Type (Psych Patients Only)  Admission Status Involuntary  Psychosocial Assessment  Patient Complaints None  Eye Contact Poor  Facial Expression Anxious  Affect Appropriate to circumstance;Preoccupied  Speech Slow  Interaction Minimal  Motor Activity Restless  Appearance/Hygiene Disheveled;Poor hygiene  Behavior Characteristics Cooperative;Anxious  Mood Preoccupied;Pleasant  Thought Process  Coherency Circumstantial  Content Preoccupation  Delusions None reported or observed  Perception Derealization  Hallucination None reported or observed  Judgment Impaired  Confusion None  Danger to Self  Current suicidal ideation? Denies

## 2024-03-19 DIAGNOSIS — F312 Bipolar disorder, current episode manic severe with psychotic features: Secondary | ICD-10-CM | POA: Diagnosis not present

## 2024-03-19 MED ORDER — LORAZEPAM 0.5 MG PO TABS
0.5000 mg | ORAL_TABLET | Freq: Two times a day (BID) | ORAL | Status: DC
  Administered 2024-03-19 – 2024-03-22 (×6): 0.5 mg via ORAL
  Filled 2024-03-19 (×6): qty 1

## 2024-03-19 NOTE — Progress Notes (Signed)
 Patient compliant with all scheduled meds this shift, no PRNs requested. Patient occasionally ambulates the unit without difficulty or assistance. Denies SI/ HI  03/19/24 0800  Psych Admission Type (Psych Patients Only)  Admission Status Involuntary  Psychosocial Assessment  Patient Complaints None  Eye Contact Poor  Facial Expression Anxious  Affect Appropriate to circumstance  Speech Slow  Interaction Minimal  Motor Activity Fidgety  Appearance/Hygiene Disheveled  Behavior Characteristics Cooperative  Mood Pleasant  Thought Process  Coherency Circumstantial  Content Preoccupation  Delusions None reported or observed  Perception Derealization  Hallucination None reported or observed  Judgment Impaired  Confusion None  Danger to Self  Current suicidal ideation? Denies  Agreement Not to Harm Self Yes  Description of Agreement verbal  Danger to Others  Danger to Others None reported or observed

## 2024-03-19 NOTE — Group Note (Signed)
 Date:  03/19/2024 Time:  8:38 PM  Group Topic/Focus:  Wrap-Up Group:   The focus of this group is to help patients review their daily goal of treatment and discuss progress on daily workbooks.    Participation Level:  Did Not Attend   Avilyn Virtue Dacosta 03/19/2024, 8:38 PM

## 2024-03-19 NOTE — Progress Notes (Addendum)
 Coordination of care:  CSW called to follow-up in regards to bed availability for patient in efforts to do hospital to hospital transfer for electroconvulsive therapy.    [facility] in [place] [phone]    [place] in the Psych intake department mentioned that they are currently at capacity for patient acceptance.    [facility] [phone]. [phone])   CSW spoke with Almarie, she mentioned that she would reach  back out to psych coordinator about availability and to  reopen for acceptance.   CSW will followup with [facility] adult psychiatry clinic on Sunday.      Nancyann Cotterman LCSWA

## 2024-03-19 NOTE — Progress Notes (Signed)
 [facility] MD Progress Note  03/19/2024 2:53 PM Allison Bolton  MRN:  [id number] Subjective:   Allison Bolton is a 53 yr old female who presented on 02/17/24 to [facility] ED with a chief complaint of anxiety. In the ED, she was found to be acutely psychotic with tangential speech, agitation, and delusional thought content. Patient was admitted involuntarily to [facility] on 8/14.  PPHx is significant for Substance-Induced Psychosis, Bipolar I Disorder, Stimulant Use Disorder, GAD, Suicide Attempt (2007), and Prior Psychiatric Hospitalizations (last- [facility] 01/2024).   Case was discussed in the multidisciplinary team. MAR was reviewed and patient was compliant with medications.  She did not need any PRN medications yesterday.   Psychiatric Team made the following recommendations yesterday: -Continue Haldol  10 mg q12 for psychosis and mood stability -Stop Zyprexa  -Continue Geodon  40 mg BID for psychosis and mood stability -Continue Depakote  DR 750 mg q12 for mood stability -Continue Ativan  1 mg q12 for agitation  -Pursue transfer for ECT   On interview today she answers questions with mostly saying yes or no.  She reports she slept good last night.  She reports her appetite is doing good.  She reports no SI, HI, or AVH.  She reports no Paranoia or Ideas of Reference.  She reports no issues with her medications.  She reports no other concerns at present.    Principal Problem: Bipolar affective disorder, current episode manic with psychotic symptoms (HCC) Diagnosis: Principal Problem:   Bipolar affective disorder, current episode manic with psychotic symptoms (HCC)  Total Time spent with patient:  I personally spent 35 minutes on the unit in direct patient care. The direct patient care time included face-to-face time with the patient, reviewing the patient's chart, communicating with other professionals, and coordinating care.    Past Psychiatric History:  Substance-Induced Psychosis, Bipolar  I Disorder, Stimulant Use Disorder, GAD, Suicide Attempt (2007), and Prior Psychiatric Hospitalizations (last- [facility] 01/2024).  Past Medical History:  Past Medical History:  Diagnosis Date   Anxiety    Arthritis    Asthma    Bipolar 1 disorder (HCC)    Diverticulosis    Dysrhythmia    sts I have heart palpitations   Fibromyalgia    H/O hiatal hernia    4   Headache(784.0)    High blood pressure    Meniere's disease    S/P colonoscopy    Dr. Golda 2010: few small diverticula at sigmoid. otherwise normal.    Shortness of breath     Past Surgical History:  Procedure Laterality Date   ABDOMINAL HYSTERECTOMY     APPENDECTOMY     CESAREAN SECTION     CHOLECYSTECTOMY     COLONOSCOPY N/A 12/25/2023   Procedure: COLONOSCOPY;  Surgeon: Eartha Angelia Sieving, MD;  Location: [facility];  Service: Gastroenterology;  Laterality: N/A;  11:30am, asa 1   exploratory laparoscopy     FOOT SURGERY     HEMORRHOID SURGERY     LAPAROSCOPIC APPENDECTOMY  02/19/2011   Procedure: APPENDECTOMY LAPAROSCOPIC;  Surgeon: Oneil DELENA Budge;  Location: [facility] ORS;  Service: General;  Laterality: N/A;   LAPAROSCOPY  02/19/2011   Procedure: LAPAROSCOPY DIAGNOSTIC;  Surgeon: Oneil DELENA Budge;  Location: [facility] ORS;  Service: General;  Laterality: N/A;   left ovarian removal     multiple hernia repairs     Right ovarian removal     TONSILLECTOMY     TONSILLECTOMY AND ADENOIDECTOMY     tubes in ears  UMBILICAL HERNIA REPAIR  Dec 2011   Dr. Mavis   wisdom teeth removal     Family History:  Family History  Problem Relation Age of Onset   Thyroid  disease Mother    Colon cancer Father    Breast cancer Maternal Aunt    Colon cancer Brother    Colon cancer Other        paternal and maternal grandfather   Meniere's disease Other    Anesthesia problems Neg Hx    Hypotension Neg Hx    Malignant hyperthermia Neg Hx    Pseudochol deficiency Neg Hx    Family Psychiatric  History:  None Reported   Social  History:  Social History   Substance and Sexual Activity  Alcohol Use No   Comment: not since June 2012     Social History   Substance and Sexual Activity  Drug Use Yes   Types: Marijuana   Comment: patient denies any-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    Social History   Socioeconomic History   Marital status: Legally Separated    Spouse name: Not on file   Number of children: Not on file   Years of education: Not on file   Highest education level: Not on file  Occupational History   Not on file  Tobacco Use   Smoking status: Every Day    Current packs/day: 0.00    Average packs/day: 0.5 packs/day for 37.3 years (18.7 ttl pk-yrs)    Types: Cigarettes    Start date: 07/07/1981    Last attempt to quit: 11/05/2018    Years since quitting: 5.3   Smokeless tobacco: Never  Vaping Use   Vaping status: Never Used  Substance and Sexual Activity   Alcohol use: No    Comment: not since June 2012   Drug use: Yes    Types: Marijuana    Comment: patient denies any-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   Sexual activity: Never    Birth control/protection: Surgical  Other Topics Concern   Not on file  Social History Narrative   Not on file   Social Drivers of Health   Financial Resource Strain: Not on file  Food Insecurity: Food Insecurity Present (02/18/2024)   Hunger Vital Sign    Worried About Running Out of Food in the Last Year: Sometimes true    Ran Out of Food in the Last Year: Sometimes true  Transportation Needs: Unmet Transportation Needs (02/18/2024)   PRAPARE - Administrator, Civil Service (Medical): Yes    Lack of Transportation (Non-Medical): Yes  Physical Activity: Not on file  Stress: Not on file  Social Connections: Unknown (10/14/2021)   Received from [facility]   Social Connections    Do your friends and family support you?: Not on file    What agencies support you?: Not on file   Additional Social History:                          Sleep: Good Estimated Sleeping Duration (Last 24 Hours): 6.50-9.50 hours  Appetite:  Good  Current Medications: Current Facility-Administered Medications  Medication Dose Route Frequency Provider Last Rate Last Admin   acetaminophen  (TYLENOL ) tablet 650 mg  650 mg Oral Q6H PRN White, Patrice L, NP   650 mg at 03/15/24 2056   alum & mag hydroxide-simeth (MAALOX/MYLANTA) 200-200-20 MG/5ML suspension 30 mL  30 mL Oral  Q4H PRN Teresa Jes L, NP   30 mL at 03/17/24 2128   benztropine  (COGENTIN ) tablet 1 mg  1 mg Oral BID White, Patrice L, NP   1 mg at 03/19/24 9256   divalproex  (DEPAKOTE ) DR tablet 750 mg  750 mg Oral Q12H Ji, Andrew, MD   750 mg at 03/19/24 9256   haloperidol  (HALDOL ) tablet 10 mg  10 mg Oral Q12H Ji, Andrew, MD   10 mg at 03/19/24 9256   hydrOXYzine  (ATARAX ) tablet 25 mg  25 mg Oral TID PRN White, Patrice L, NP   25 mg at 03/13/24 1524   LORazepam  (ATIVAN ) tablet 0.5 mg  0.5 mg Oral Q12H Teylor Wolven S, DO       magnesium  hydroxide (MILK OF MAGNESIA) suspension 30 mL  30 mL Oral Daily PRN White, Patrice L, NP       nicotine  polacrilex (NICORETTE ) gum 2 mg  2 mg Oral PRN Trudy Carwin, NP   2 mg at 03/06/24 9185   OLANZapine  (ZYPREXA ) injection 10 mg  10 mg Intramuscular TID PRN White, Patrice L, NP       OLANZapine  (ZYPREXA ) injection 5 mg  5 mg Intramuscular TID PRN White, Patrice L, NP       OLANZapine  zydis (ZYPREXA ) disintegrating tablet 5 mg  5 mg Oral TID PRN White, Patrice L, NP   5 mg at 03/13/24 2221   ondansetron  (ZOFRAN -ODT) disintegrating tablet 4 mg  4 mg Oral Q8H PRN Lynnette Barter, MD   4 mg at 03/07/24 2205   traZODone  (DESYREL ) tablet 50 mg  50 mg Oral QHS White, Patrice L, NP   50 mg at 03/18/24 2038   ziprasidone  (GEODON ) capsule 40 mg  40 mg Oral BID WC Marry Clamp, MD   40 mg at 03/19/24 9256    Lab Results:  No results found for this or any previous visit (from the past 48 hours).   Blood Alcohol level:  Lab  Results  Component Value Date   [facility] <15 02/17/2024   ETH <15 01/14/2024    Metabolic Disorder Labs: Lab Results  Component Value Date   HGBA1C 5.1 01/18/2024   MPG 99.67 01/18/2024   MPG 105.41 05/11/2019   Lab Results  Component Value Date   PROLACTIN 24.1 (H) 05/11/2019   Lab Results  Component Value Date   CHOL 154 01/18/2024   TRIG 180 (H) 01/18/2024   HDL 49 01/18/2024   CHOLHDL 3.1 01/18/2024   VLDL 36 01/18/2024   LDLCALC 69 01/18/2024   LDLCALC 82 05/11/2019    Physical Findings: AIMS:  ,  ,  ,  ,  ,  ,   CIWA:    COWS:     Musculoskeletal: Strength & Muscle Tone: within normal limits Gait & Station: normal Patient leans: N/A  Psychiatric Specialty Exam:  Presentation  General Appearance:  Disheveled  Eye Contact: Fair  Speech: -- (minimal- yes/no)  Speech Volume: Decreased  Handedness:No data recorded  Mood and Affect  Mood: -- (fine)  Affect: Inappropriate (smiling)   Thought Process  Thought Processes: Linear  Descriptions of Associations:Loose  Orientation:Partial  Thought Content:WDL  History of Schizophrenia/Schizoaffective disorder:No  Duration of Psychotic Symptoms:Greater than six months  Hallucinations:Hallucinations: None   Ideas of Reference:None  Suicidal Thoughts:Suicidal Thoughts: No   Homicidal Thoughts:Homicidal Thoughts: No    Sensorium  Memory: Immediate Poor  Judgment: Impaired  Insight: Lacking   Executive Functions  Concentration: Poor  Attention Span: Poor  Recall: Poor  Fund of Knowledge: Poor  Language: Poor   Psychomotor Activity  Psychomotor Activity:Psychomotor Activity: Decreased    Assets  Assets: Resilience   Sleep  Sleep:Sleep: Good     Physical Exam: Physical Exam Vitals and nursing note reviewed.  Constitutional:      General: She is not in acute distress.    Appearance: She is normal weight. She is not ill-appearing or toxic-appearing.   HENT:     Head: Normocephalic and atraumatic.  Pulmonary:     Effort: Pulmonary effort is normal.    Review of Systems  Respiratory:  Negative for cough and shortness of breath.   Cardiovascular:  Negative for chest pain.  Gastrointestinal:  Negative for abdominal pain, constipation, diarrhea, nausea and vomiting.  Neurological:  Negative for dizziness, weakness and headaches.  Psychiatric/Behavioral:  Negative for depression, hallucinations and suicidal ideas. The patient is not nervous/anxious.    Blood pressure 113/87, pulse 85, temperature 97.7 F (36.5 C), temperature source Oral, resp. rate 20, height 5' 6 (1.676 m), weight 80.7 kg, SpO2 100%. Body mass index is 28.71 kg/m.   Treatment Plan Summary: Daily contact with patient to assess and evaluate symptoms and progress in treatment and Medication management  Allison Bolton is a 53 yr old female who presented on 02/17/24 to [facility] ED with a chief complaint of anxiety. In the ED, she was found to be acutely psychotic with tangential speech, agitation, and delusional thought content. Patient was admitted involuntarily to [facility] on 8/14.  PPHx is significant for Substance-Induced Psychosis, Bipolar I Disorder, Stimulant Use Disorder, GAD, Suicide Attempt (2007), and Prior Psychiatric Hospitalizations (last- [facility] 01/2024).    Allison Bolton is more awake today, however, she mostly answer questions with yes and no and not much more.  She is smiling throughout the interview despite the conversation.  She continues to not take care of her daily needs and is not showering.  We will continue to get placement at a facility that provides ECT.  We will reduce her Ativan  as she has not had any outbursts for several days.  We will continue to monitor.    Substance-induced psychosis  Bipolar disorder: -Continue Haldol  10 mg q12 for psychosis and mood stability -Continue Geodon  40 mg BID for psychosis and mood stability -Continue Depakote  DR  750 mg q12 for mood stability -Decrease Ativan  to 0.5 mg q12 for agitation  -Continue Cogentin  1 mg BID for Drug Induced EPS -Continue Agitation Protocol: Zyprexa    Nicotine  Dependence: -Continue Nicotine  Gum 2 mg PRN   -Continue Zofran  4 mg q8 PRN nausea/vomiting -Continue PRN's: Tylenol , Maalox, Atarax , Milk of Magnesia, Trazodone    --  The risks/benefits/side-effects/alternatives to medications were discussed in detail with the patient and time was given for questions. The patient consents to medication trials.                -- Metabolic profile and EKG monitoring obtained while on an atypical antipsychotic (BMI: 28.73 Lipid Panel: WNL except Trig: 180 HbgA1c: 5.1)              -- Encouraged patient to participate in unit milieu and in scheduled group therapies              -- Short Term Goals: Ability to identify changes in lifestyle to reduce recurrence of condition will improve, Ability to verbalize feelings will improve, Ability to disclose and discuss suicidal ideas, Ability to demonstrate self-control will improve, Ability to identify and develop effective coping behaviors will improve,  Ability to maintain clinical measurements within normal limits will improve, Compliance with prescribed medications will improve, and Ability to identify triggers associated with substance abuse/mental health issues will improve             -- Long Term Goals: Improvement in symptoms so as ready for discharge   Safety and Monitoring:             -- Involuntary admission to inpatient psychiatric unit for safety, stabilization and treatment             -- Daily contact with patient to assess and evaluate symptoms and progress in treatment             -- Patient's case to be discussed in multi-disciplinary team meeting             -- Observation Level : q15 minute checks             -- Vital signs:  q12 hours             -- Precautions: suicide, elopement, and assault  Discharge Planning:               -- Social work and case management to assist with discharge planning and identification of hospital follow-up needs prior to discharge             -- Estimated LOS: 3-7 more days             -- Discharge Concerns: Need to establish a safety plan; Medication compliance and effectiveness             -- Discharge Goals: Return home with outpatient referrals for mental health follow-up including medication management/psychotherapy   Marsa GORMAN Rosser, DO 03/19/2024, 2:53 PM

## 2024-03-19 NOTE — BHH Group Notes (Signed)
 Adult Psychoeducational Group Note  Date:  03/19/2024 Time:  12:31 PM  Group Topic/Focus:  Goals Group:   The focus of this group is to help patients establish daily goals to achieve during treatment and discuss how the patient can incorporate goal setting into their daily lives to aide in recovery. Orientation:   The focus of this group is to educate the patient on the purpose and policies of crisis stabilization and provide a format to answer questions about their admission.  The group details unit policies and expectations of patients while admitted.  Participation Level:  Did Not Attend  Participation Quality:    Affect:    Cognitive:    Insight:   Engagement in Group:    Modes of Intervention:    Additional Comments:    Rosendo Couser O 03/19/2024, 12:31 PM

## 2024-03-19 NOTE — Plan of Care (Signed)
   Problem: Activity: Goal: Interest or engagement in activities will improve Outcome: Progressing

## 2024-03-19 NOTE — BHH Group Notes (Signed)
 LCSW Wellness Group Note   03/19/2024 1:00pm  Type of Group and Topic: Psychoeducational Group:  Wellness  Participation Level:  minimal  Description of Group  Wellness group introduces the topic and its focus on developing healthy habits across the spectrum and its relationship to a decrease in hospital admissions.  Six areas of wellness are discussed: physical, social spiritual, intellectual, occupational, and emotional.  Patients are asked to consider their current wellness habits and to identify areas of wellness where they are interested and able to focus on improvements.    Therapeutic Goals Patients will understand components of wellness and how they can positively impact overall health.  Patients will identify areas of wellness where they have developed good habits. Patients will identify areas of wellness where they would like to make improvements.    Summary of Patient Progress: pt came into group after it was already started, stayed for 10-15 minutes but did not meaningfully participate prior to leaving again.      Therapeutic Modalities: Cognitive Behavioral Therapy Psychoeducation    Bridget Cordella Simmonds, LCSW

## 2024-03-19 NOTE — Plan of Care (Signed)
   Problem: Education: Goal: Knowledge of [facility] Education information/materials will improve Outcome: Progressing   Problem: Coping: Goal: Ability to verbalize frustrations and anger appropriately will improve Outcome: Progressing   Problem: Safety: Goal: Periods of time without injury will increase Outcome: Progressing

## 2024-03-19 NOTE — Progress Notes (Signed)
(  Sleep Hours) -7.25 (Any PRNs that were needed, meds refused, or side effects to meds)- none (Any disturbances and when (visitation, over night)-none (Concerns raised by the patient)- none (SI/HI/AVH)- denies all

## 2024-03-19 NOTE — Plan of Care (Signed)
  Problem: Activity: Goal: Interest or engagement in activities will improve Outcome: Progressing Goal: Sleeping patterns will improve Outcome: Progressing   Problem: Safety: Goal: Periods of time without injury will increase Outcome: Progressing

## 2024-03-20 DIAGNOSIS — F312 Bipolar disorder, current episode manic severe with psychotic features: Secondary | ICD-10-CM | POA: Diagnosis not present

## 2024-03-20 NOTE — Plan of Care (Signed)
   Problem: Education: Goal: Emotional status will improve Outcome: Progressing Goal: Mental status will improve Outcome: Progressing Goal: Verbalization of understanding the information provided will improve Outcome: Progressing

## 2024-03-20 NOTE — Progress Notes (Signed)
(  Sleep Hours) -8.25  (Any PRNs that were needed, meds refused, or side effects to meds)-   (Any disturbances and when (visitation, over night)-N/A  (Concerns raised by the patient)- none  (SI/HI/AVH)-denies

## 2024-03-20 NOTE — BHH Group Notes (Signed)
 Adult Psychoeducational Group Note  Date:  03/20/2024 Time:  7:45 PM  Group Topic/Focus:  Goals Group:   The focus of this group is to help patients establish daily goals to achieve during treatment and discuss how the patient can incorporate goal setting into their daily lives to aide in recovery. Orientation:   The focus of this group is to educate the patient on the purpose and policies of crisis stabilization and provide a format to answer questions about their admission.  The group details unit policies and expectations of patients while admitted.  Participation Level:  Did Not Attend  Participation Quality:    Affect:    Cognitive:    Insight:   Engagement in Group:    Modes of Intervention:    Additional Comments:    Iain Sawchuk O 03/20/2024, 7:45 PM

## 2024-03-20 NOTE — Plan of Care (Signed)
   Problem: Education: Goal: Emotional status will improve Outcome: Progressing Goal: Mental status will improve Outcome: Progressing   Problem: Activity: Goal: Interest or engagement in activities will improve Outcome: Progressing   Problem: Safety: Goal: Periods of time without injury will increase Outcome: Progressing

## 2024-03-20 NOTE — Progress Notes (Signed)
(  Sleep Hours) - 8.25 (Any PRNs that were needed, meds refused, or side effects to meds)- nicorette  gum (Any disturbances and when (visitation, over night)-none (Concerns raised by the patient)- none (SI/HI/AVH)- denies all

## 2024-03-20 NOTE — Progress Notes (Signed)
 Coordination of care:   CSW called to follow-up in regards to bed availability for patient in efforts to do hospital to hospital transfer for electroconvulsive therapy.      [facility] [phone]. [phone])  CSW spoke with RN Matt @1435  who reported that the pt was declined d/t chronic condition / presentation.   Golda Louder [facility] 03/20/2024

## 2024-03-20 NOTE — Progress Notes (Signed)
   03/20/24 1032  Psych Admission Type (Psych Patients Only)  Admission Status Involuntary  Psychosocial Assessment  Patient Complaints None  Eye Contact Fair  Facial Expression Blank  Affect Preoccupied  Speech Slow  Interaction Minimal  Motor Activity Slow  Appearance/Hygiene In scrubs;Body odor;Disheveled  Behavior Characteristics Anxious  Mood Anxious;Preoccupied  Thought Process  Coherency Disorganized  Content Preoccupation  Delusions None reported or observed  Perception Derealization  Hallucination None reported or observed  Judgment Impaired  Confusion Mild  Danger to Self  Current suicidal ideation? Denies  Agreement Not to Harm Self Yes  Description of Agreement Verbal  Danger to Others  Danger to Others None reported or observed

## 2024-03-20 NOTE — Progress Notes (Signed)
 [facility] MD Progress Note  03/20/2024 9:00 AM Allison Bolton  MRN:  [id number] Subjective:   Allison Bolton is a 53 yr old female who presented on 02/17/24 to [facility] ED with a chief complaint of anxiety. In the ED, she was found to be acutely psychotic with tangential speech, agitation, and delusional thought content. Patient was admitted involuntarily to [facility] on 8/14.  PPHx is significant for Substance-Induced Psychosis, Bipolar I Disorder, Stimulant Use Disorder, GAD, Suicide Attempt (2007), and Prior Psychiatric Hospitalizations (last- [facility] 01/2024).   Case was discussed in the multidisciplinary team. MAR was reviewed and patient was compliant with medications.  She received PRN Maalox yesterday.   Psychiatric Team made the following recommendations yesterday: -Continue Haldol  10 mg q12 for psychosis and mood stability -Continue Geodon  40 mg BID for psychosis and mood stability -Continue Depakote  DR 750 mg q12 for mood stability -Decrease Ativan  to 0.5 mg q12 for agitation  -Pursue transfer for ECT    On interview today patient reports she slept good last night.  She reports her appetite is doing good.  She reports no SI, HI, or AVH.  She reports no Paranoia or Ideas of Reference.  She reports no issues with her medications.  She asks when she will be discharged.  When asked where she will go she reports she will return to [place] by herself.  When asked if she had contacted anyone from her family she reports she had not.  Encouraged her to talk to her family.  She reports no other concerns at present.   Principal Problem: Bipolar affective disorder, current episode manic with psychotic symptoms (HCC) Diagnosis: Principal Problem:   Bipolar affective disorder, current episode manic with psychotic symptoms (HCC)  Total Time spent with patient:  I personally spent 35 minutes on the unit in direct patient care. The direct patient care time included face-to-face time with the  patient, reviewing the patient's chart, communicating with other professionals, and coordinating care.    Past Psychiatric History:  Substance-Induced Psychosis, Bipolar I Disorder, Stimulant Use Disorder, GAD, Suicide Attempt (2007), and Prior Psychiatric Hospitalizations (last- [facility] 01/2024).  Past Medical History:  Past Medical History:  Diagnosis Date   Anxiety    Arthritis    Asthma    Bipolar 1 disorder (HCC)    Diverticulosis    Dysrhythmia    sts I have heart palpitations   Fibromyalgia    H/O hiatal hernia    4   Headache(784.0)    High blood pressure    Meniere's disease    S/P colonoscopy    Dr. Golda 2010: few small diverticula at sigmoid. otherwise normal.    Shortness of breath     Past Surgical History:  Procedure Laterality Date   ABDOMINAL HYSTERECTOMY     APPENDECTOMY     CESAREAN SECTION     CHOLECYSTECTOMY     COLONOSCOPY N/A 12/25/2023   Procedure: COLONOSCOPY;  Surgeon: Eartha Angelia Sieving, MD;  Location: [facility] ENDO [facility]ITE;  Service: Gastroenterology;  Laterality: N/A;  11:30am, asa 1   exploratory laparoscopy     FOOT SURGERY     HEMORRHOID SURGERY     LAPAROSCOPIC APPENDECTOMY  02/19/2011   Procedure: APPENDECTOMY LAPAROSCOPIC;  Surgeon: Oneil DELENA Budge;  Location: [facility] ORS;  Service: General;  Laterality: N/A;   LAPAROSCOPY  02/19/2011   Procedure: LAPAROSCOPY DIAGNOSTIC;  Surgeon: Oneil DELENA Budge;  Location: [facility] ORS;  Service: General;  Laterality: N/A;   left ovarian removal  multiple hernia repairs     Right ovarian removal     TONSILLECTOMY     TONSILLECTOMY AND ADENOIDECTOMY     tubes in ears     UMBILICAL HERNIA REPAIR  Dec 2011   Dr. Mavis   wisdom teeth removal     Family History:  Family History  Problem Relation Age of Onset   Thyroid  disease Mother    Colon cancer Father    Breast cancer Maternal Aunt    Colon cancer Brother    Colon cancer Other        paternal and maternal grandfather   Meniere's disease Other     Anesthesia problems Neg Hx    Hypotension Neg Hx    Malignant hyperthermia Neg Hx    Pseudochol deficiency Neg Hx    Family Psychiatric  History:  None Reported   Social History:  Social History   Substance and Sexual Activity  Alcohol Use No   Comment: not since June 2012     Social History   Substance and Sexual Activity  Drug Use Yes   Types: Marijuana   Comment: patient denies any-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    Social History   Socioeconomic History   Marital status: Legally Separated    Spouse name: Not on file   Number of children: Not on file   Years of education: Not on file   Highest education level: Not on file  Occupational History   Not on file  Tobacco Use   Smoking status: Every Day    Current packs/day: 0.00    Average packs/day: 0.5 packs/day for 37.3 years (18.7 ttl pk-yrs)    Types: Cigarettes    Start date: 07/07/1981    Last attempt to quit: 11/05/2018    Years since quitting: 5.3   Smokeless tobacco: Never  Vaping Use   Vaping status: Never Used  Substance and Sexual Activity   Alcohol use: No    Comment: not since June 2012   Drug use: Yes    Types: Marijuana    Comment: patient denies any-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   Sexual activity: Never    Birth control/protection: Surgical  Other Topics Concern   Not on file  Social History Narrative   Not on file   Social Drivers of Health   Financial Resource Strain: Not on file  Food Insecurity: Food Insecurity Present (02/18/2024)   Hunger Vital Sign    Worried About Running Out of Food in the Last Year: Sometimes true    Ran Out of Food in the Last Year: Sometimes true  Transportation Needs: Unmet Transportation Needs (02/18/2024)   PRAPARE - Administrator, Civil Service (Medical): Yes    Lack of Transportation (Non-Medical): Yes  Physical Activity: Not on file  Stress: Not on file  Social Connections: Unknown (10/14/2021)   Received from [facility]   Social Connections    Do your friends and family support you?: Not on file    What agencies support you?: Not on file   Additional Social History:                         Sleep: Good Estimated Sleeping Duration (Last 24 Hours): 5.50-7.75 hours  Appetite:  Good  Current Medications: Current Facility-Administered Medications  Medication Dose Route Frequency Provider Last Rate Last Admin   acetaminophen  (TYLENOL ) tablet 650 mg  650 mg Oral Q6H PRN White, Patrice L, NP   650 mg at 03/15/24 2056   alum & mag hydroxide-simeth (MAALOX/MYLANTA) 200-200-20 MG/5ML suspension 30 mL  30 mL Oral Q4H PRN White, Patrice L, NP   30 mL at 03/19/24 1845   benztropine  (COGENTIN ) tablet 1 mg  1 mg Oral BID White, Patrice L, NP   1 mg at 03/20/24 0748   divalproex  (DEPAKOTE ) DR tablet 750 mg  750 mg Oral Q12H Ji, Andrew, MD   750 mg at 03/20/24 0748   haloperidol  (HALDOL ) tablet 10 mg  10 mg Oral Q12H Ji, Andrew, MD   10 mg at 03/20/24 9251   hydrOXYzine  (ATARAX ) tablet 25 mg  25 mg Oral TID PRN White, Patrice L, NP   25 mg at 03/13/24 1524   LORazepam  (ATIVAN ) tablet 0.5 mg  0.5 mg Oral Q12H Raylie Maddison S, DO   0.5 mg at 03/20/24 9250   magnesium  hydroxide (MILK OF MAGNESIA) suspension 30 mL  30 mL Oral Daily PRN White, Patrice L, NP       nicotine  polacrilex (NICORETTE ) gum 2 mg  2 mg Oral PRN Trudy Carwin, NP   2 mg at 03/19/24 1942   OLANZapine  (ZYPREXA ) injection 10 mg  10 mg Intramuscular TID PRN White, Patrice L, NP       OLANZapine  (ZYPREXA ) injection 5 mg  5 mg Intramuscular TID PRN White, Patrice L, NP       OLANZapine  zydis (ZYPREXA ) disintegrating tablet 5 mg  5 mg Oral TID PRN White, Patrice L, NP   5 mg at 03/13/24 2221   ondansetron  (ZOFRAN -ODT) disintegrating tablet 4 mg  4 mg Oral Q8H PRN Lynnette Barter, MD   4 mg at 03/07/24 2205   traZODone  (DESYREL ) tablet 50 mg  50 mg Oral QHS White, Patrice L, NP   50 mg at 03/19/24 2106   ziprasidone  (GEODON )  capsule 40 mg  40 mg Oral BID WC Marry Clamp, MD   40 mg at 03/20/24 0749    Lab Results:  No results found for this or any previous visit (from the past 48 hours).   Blood Alcohol level:  Lab Results  Component Value Date   [facility] <15 02/17/2024   ETH <15 01/14/2024    Metabolic Disorder Labs: Lab Results  Component Value Date   HGBA1C 5.1 01/18/2024   MPG 99.67 01/18/2024   MPG 105.41 05/11/2019   Lab Results  Component Value Date   PROLACTIN 24.1 (H) 05/11/2019   Lab Results  Component Value Date   CHOL 154 01/18/2024   TRIG 180 (H) 01/18/2024   HDL 49 01/18/2024   CHOLHDL 3.1 01/18/2024   VLDL 36 01/18/2024   LDLCALC 69 01/18/2024   LDLCALC 82 05/11/2019    Physical Findings: AIMS:  ,  ,  ,  ,  ,  ,   CIWA:    COWS:     Musculoskeletal: Strength & Muscle Tone: within normal limits Gait & Station: normal Patient leans: N/A  Psychiatric Specialty Exam:  Presentation  General Appearance:  Disheveled  Eye Contact: Good  Speech: Normal Rate (occasionally slured)  Speech Volume: Normal  Handedness:No data recorded  Mood and Affect  Mood: -- (good)  Affect: Congruent   Thought Process  Thought Processes: Linear; Goal Directed  Descriptions of Associations:Loose  Orientation:Partial  Thought Content:WDL  History of Schizophrenia/Schizoaffective disorder:No  Duration of Psychotic Symptoms:Greater than six months  Hallucinations:Hallucinations: None   Ideas of Reference:None  Suicidal Thoughts:Suicidal  Thoughts: No   Homicidal Thoughts:Homicidal Thoughts: No    Sensorium  Memory: Immediate Poor  Judgment: Intact  Insight: Present   Executive Functions  Concentration: Poor  Attention Span: Poor  Recall: Poor  Fund of Knowledge: Poor  Language: Fair   Psychomotor Activity  Psychomotor Activity:Psychomotor Activity: Decreased    Assets  Assets: Resilience   Sleep  Sleep:Sleep:  Good     Physical Exam: Physical Exam Vitals and nursing note reviewed.  Constitutional:      General: She is not in acute distress.    Appearance: She is normal weight. She is not ill-appearing or toxic-appearing.  HENT:     Head: Normocephalic and atraumatic.  Pulmonary:     Effort: Pulmonary effort is normal.  Neurological:     Mental Status: She is alert.    Review of Systems  Respiratory:  Negative for cough and shortness of breath.   Cardiovascular:  Negative for chest pain.  Gastrointestinal:  Negative for abdominal pain, constipation, diarrhea, nausea and vomiting.  Neurological:  Negative for dizziness, weakness and headaches.  Psychiatric/Behavioral:  Negative for depression, hallucinations and suicidal ideas. The patient is not nervous/anxious.    Blood pressure 104/80, pulse 76, temperature (!) 97.3 F (36.3 C), temperature source Oral, resp. rate 20, height 5' 6 (1.676 m), weight 80.7 kg, SpO2 98%. Body mass index is 28.71 kg/m.   Treatment Plan Summary: Daily contact with patient to assess and evaluate symptoms and progress in treatment and Medication management  Allison Bolton is a 52 yr old female who presented on 02/17/24 to [facility] ED with a chief complaint of anxiety. In the ED, she was found to be acutely psychotic with tangential speech, agitation, and delusional thought content. Patient was admitted involuntarily to [facility] on 8/14.  PPHx is significant for Substance-Induced Psychosis, Bipolar I Disorder, Stimulant Use Disorder, GAD, Suicide Attempt (2007), and Prior Psychiatric Hospitalizations (last- [facility] 01/2024).    Allison Bolton is more talkative today, however, she continues to be disorganized.  She is not attending to her daily needs- not showering unless prompted by staff and there is concern about food intake.   We will start a food log to measure intake.  Given her inability to care for herself and continued psychosis she still needs ECT and we will  continue to work on transfer to an appropriate facility.  We will continue to monitor.    Substance-induced psychosis  Bipolar disorder: -Continue Haldol  10 mg q12 for psychosis and mood stability -Continue Geodon  40 mg BID for psychosis and mood stability -Continue Depakote  DR 750 mg q12 for mood stability -Continue Ativan  0.5 mg q12 for agitation  -Continue Cogentin  1 mg BID for Drug Induced EPS -Continue Agitation Protocol: Zyprexa    Nicotine  Dependence: -Continue Nicotine  Gum 2 mg PRN   -Continue Zofran  4 mg q8 PRN nausea/vomiting -Continue PRN's: Tylenol , Maalox, Atarax , Milk of Magnesia, Trazodone    --  The risks/benefits/side-effects/alternatives to medications were discussed in detail with the patient and time was given for questions. The patient consents to medication trials.                -- Metabolic profile and EKG monitoring obtained while on an atypical antipsychotic (BMI: 28.73 Lipid Panel: WNL except Trig: 180 HbgA1c: 5.1)              -- Encouraged patient to participate in unit milieu and in scheduled group therapies              --  Short Term Goals: Ability to identify changes in lifestyle to reduce recurrence of condition will improve, Ability to verbalize feelings will improve, Ability to disclose and discuss suicidal ideas, Ability to demonstrate self-control will improve, Ability to identify and develop effective coping behaviors will improve, Ability to maintain clinical measurements within normal limits will improve, Compliance with prescribed medications will improve, and Ability to identify triggers associated with substance abuse/mental health issues will improve             -- Long Term Goals: Improvement in symptoms so as ready for discharge   Safety and Monitoring:             -- Involuntary admission to inpatient psychiatric unit for safety, stabilization and treatment             -- Daily contact with patient to assess and evaluate symptoms and  progress in treatment             -- Patient's case to be discussed in multi-disciplinary team meeting             -- Observation Level : q15 minute checks             -- Vital signs:  q12 hours             -- Precautions: suicide, elopement, and assault  Discharge Planning:              -- Social work and case management to assist with discharge planning and identification of hospital follow-up needs prior to discharge             -- Estimated LOS: 3-7 more days             -- Discharge Concerns: Need to establish a safety plan; Medication compliance and effectiveness             -- Discharge Goals: Return home with outpatient referrals for mental health follow-up including medication management/psychotherapy   Marsa GORMAN Rosser, DO 03/20/2024, 9:00 AM

## 2024-03-20 NOTE — Group Note (Signed)
 Date:  03/20/2024 Time:  8:33 PM  Group Topic/Focus:  Wrap-Up Group:   The focus of this group is to help patients review their daily goal of treatment and discuss progress on daily workbooks.    Participation Level:  Active  Participation Quality:  Appropriate  Affect:  Appropriate  Cognitive:  Oriented  Insight: Appropriate  Engagement in Group:  Engaged  Modes of Intervention:  Education and Exploration  Additional Comments:  Patient attended and participated in group tonight.  She reports that her goal was to take a shower and she did.  Gwenn Chillington Dacosta 03/20/2024, 8:33 PM

## 2024-03-20 NOTE — Progress Notes (Signed)
   03/19/24 2000  Psych Admission Type (Psych Patients Only)  Admission Status Involuntary  Psychosocial Assessment  Patient Complaints None  Eye Contact Poor  Facial Expression Anxious  Affect Appropriate to circumstance  Speech Slow  Interaction Minimal  Motor Activity Fidgety  Appearance/Hygiene Unremarkable  Behavior Characteristics Cooperative;Appropriate to situation  Mood Pleasant  Thought Process  Coherency Disorganized  Content Preoccupation  Delusions None reported or observed  Perception Derealization  Hallucination None reported or observed  Judgment Impaired  Confusion None  Danger to Self  Current suicidal ideation? Denies  Description of Suicide Plan none  Agreement Not to Harm Self Yes  Description of Agreement verbal  Danger to Others  Danger to Others None reported or observed

## 2024-03-21 DIAGNOSIS — F312 Bipolar disorder, current episode manic severe with psychotic features: Secondary | ICD-10-CM | POA: Diagnosis not present

## 2024-03-21 MED ORDER — ZIPRASIDONE HCL 60 MG PO CAPS
60.0000 mg | ORAL_CAPSULE | Freq: Two times a day (BID) | ORAL | Status: DC
  Administered 2024-03-21 – 2024-05-08 (×96): 60 mg via ORAL
  Filled 2024-03-21 (×2): qty 1
  Filled 2024-03-21: qty 3
  Filled 2024-03-21: qty 1
  Filled 2024-03-21: qty 3
  Filled 2024-03-21: qty 1
  Filled 2024-03-21 (×3): qty 3
  Filled 2024-03-21: qty 1
  Filled 2024-03-21: qty 3
  Filled 2024-03-21: qty 1
  Filled 2024-03-21: qty 3
  Filled 2024-03-21 (×2): qty 1
  Filled 2024-03-21: qty 3
  Filled 2024-03-21 (×4): qty 1
  Filled 2024-03-21: qty 3
  Filled 2024-03-21: qty 1
  Filled 2024-03-21 (×4): qty 3
  Filled 2024-03-21 (×11): qty 1
  Filled 2024-03-21 (×3): qty 3
  Filled 2024-03-21: qty 1
  Filled 2024-03-21 (×2): qty 3
  Filled 2024-03-21 (×5): qty 1
  Filled 2024-03-21: qty 3
  Filled 2024-03-21 (×5): qty 1
  Filled 2024-03-21: qty 3
  Filled 2024-03-21: qty 1
  Filled 2024-03-21 (×2): qty 3
  Filled 2024-03-21 (×5): qty 1
  Filled 2024-03-21: qty 3
  Filled 2024-03-21: qty 1
  Filled 2024-03-21: qty 3
  Filled 2024-03-21: qty 1
  Filled 2024-03-21: qty 3
  Filled 2024-03-21: qty 1
  Filled 2024-03-21: qty 3
  Filled 2024-03-21: qty 1
  Filled 2024-03-21 (×3): qty 3
  Filled 2024-03-21 (×7): qty 1
  Filled 2024-03-21 (×4): qty 3
  Filled 2024-03-21 (×5): qty 1
  Filled 2024-03-21: qty 3
  Filled 2024-03-21 (×6): qty 1

## 2024-03-21 NOTE — Progress Notes (Addendum)
 Collateral contact - re:  Electroconvulsive therapy (ECT)   [facility] [address], [place], [place] [address] Phone:  [phone] x [phone]  Fax: [phone]   There are no appropriate beds at this time.  CSW was advised to call again tomorrow.   [facility] Electroconvulsive therapy (ECT) [address] [place], [place], [place] [address] [phone], x [phone], ID # [id number], input call back number#. Fax: [phone]  CSW faxed documents.      [facility] [address] [place], [place] [address] [phone]  There are no beds available at this time.   [facility] [address] [place], [place] [address] [phone]   CSW left a voicemail for Ms. Katheryn [phone] (CSW left several voicemails so far.    [facility], [address], [place], [place] [address], [phone]   CSW was advised to call outpatient ECT and inquire about inpatient [phone].  CSW was advised that they don't do hospital to hospital transfers, they accept patients only from ED.  CSW was also given [phone] - CSW left a voicemail.   Laisha Rau, LCSWA 03/21/2024

## 2024-03-21 NOTE — Plan of Care (Signed)
  Problem: Education: Goal: Knowledge of [facility] Education information/materials will improve Outcome: Progressing   Problem: Education: Goal: Emotional status will improve Outcome: Progressing   Problem: Education: Goal: Mental status will improve Outcome: Progressing   

## 2024-03-21 NOTE — Progress Notes (Signed)
   03/21/24 0817  Psych Admission Type (Psych Patients Only)  Admission Status Involuntary  Psychosocial Assessment  Patient Complaints None  Eye Contact Fair  Facial Expression Blank  Affect Preoccupied  Speech Slow  Interaction Cautious;Childlike  Motor Activity Slow  Appearance/Hygiene In scrubs  Behavior Characteristics Anxious  Mood Preoccupied  Thought Process  Coherency Disorganized  Content Preoccupation  Delusions WDL  Perception Derealization  Hallucination None reported or observed  Judgment Impaired  Confusion Mild  Danger to Self  Current suicidal ideation? Denies  Agreement Not to Harm Self Yes  Description of Agreement verbal  Danger to Others  Danger to Others None reported or observed

## 2024-03-21 NOTE — Progress Notes (Signed)
 [facility] MD Progress Note  03/21/2024 2:08 PM Allison Bolton  MRN:  [id number]  Principal Problem: Bipolar affective disorder, current episode manic with psychotic symptoms (HCC) Diagnosis: Principal Problem:   Bipolar affective disorder, current episode manic with psychotic symptoms (HCC)   Total Time spent with patient:  I personally spent 20 minutes on the unit in direct patient care. The direct patient care time included face-to-face time with the patient, reviewing the patient's chart, communicating with other professionals, and coordinating care.   Identifying Information and brief psychiatric history:  Allison Bolton is a 53 yr old female with working psychiatric diagnoses of bipolar 1 disorder (vs SAD-BT) and a history of prior documented substance-induced psychosis. She does have a history of prior psychiatric hospitalizations with substance use driving psychotic symptoms, however on this admission has presented with clear manic and psychotic symptoms that have not cleared despite time and medications. She has one prior suicide Attempt (2007), and Prior Psychiatric Hospitalizations (last- [facility] 01/2024).  On this admission she presented to [facility] ED on 02/17/24  with a chief complaint of anxiety. In the ED, she was found to be acutely psychotic with tangential speech, agitation, and delusional thought content. Patient was admitted involuntarily to [facility] on 8/14. Since admission the patient has been notably unresponsive to antipsychotic medications - remaining acutely psychotic and plan has been for transfer to facility that provides ECT for treatment of refractory mania with psychosis.    Interval events: No prn agitation medication needed. Slept excessively.  Interview today:  Patient remains disorganized, no overt psychotic symptoms apparent on today's interview. She reports that she is eating well and feels rested. She says she is not interested in going to groups and wants to  be left alone.       Past Medical History:  Past Medical History:  Diagnosis Date   Anxiety    Arthritis    Asthma    Bipolar 1 disorder (HCC)    Diverticulosis    Dysrhythmia    sts I have heart palpitations   Fibromyalgia    H/O hiatal hernia    4   Headache(784.0)    High blood pressure    Meniere's disease    S/P colonoscopy    Dr. Golda 2010: few small diverticula at sigmoid. otherwise normal.    Shortness of breath     Past Surgical History:  Procedure Laterality Date   ABDOMINAL HYSTERECTOMY     APPENDECTOMY     CESAREAN SECTION     CHOLECYSTECTOMY     COLONOSCOPY N/A 12/25/2023   Procedure: COLONOSCOPY;  Surgeon: Eartha Angelia Sieving, MD;  Location: [facility] ENDO [facility]ITE;  Service: Gastroenterology;  Laterality: N/A;  11:30am, asa 1   exploratory laparoscopy     FOOT SURGERY     HEMORRHOID SURGERY     LAPAROSCOPIC APPENDECTOMY  02/19/2011   Procedure: APPENDECTOMY LAPAROSCOPIC;  Surgeon: Oneil DELENA Budge;  Location: [facility] ORS;  Service: General;  Laterality: N/A;   LAPAROSCOPY  02/19/2011   Procedure: LAPAROSCOPY DIAGNOSTIC;  Surgeon: Oneil DELENA Budge;  Location: [facility] ORS;  Service: General;  Laterality: N/A;   left ovarian removal     multiple hernia repairs     Right ovarian removal     TONSILLECTOMY     TONSILLECTOMY AND ADENOIDECTOMY     tubes in ears     UMBILICAL HERNIA REPAIR  Dec 2011   Dr. Budge   wisdom teeth removal     Family History:  Family  History  Problem Relation Age of Onset   Thyroid  disease Mother    Colon cancer Father    Breast cancer Maternal Aunt    Colon cancer Brother    Colon cancer Other        paternal and maternal grandfather   Meniere's disease Other    Anesthesia problems Neg Hx    Hypotension Neg Hx    Malignant hyperthermia Neg Hx    Pseudochol deficiency Neg Hx    Family Psychiatric  History:  None Reported   Social History:  Social History   Substance and Sexual Activity  Alcohol Use No   Comment: not since June  2012     Social History   Substance and Sexual Activity  Drug Use Yes   Types: Marijuana   Comment: patient denies any-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    Social History   Socioeconomic History   Marital status: Legally Separated    Spouse name: Not on file   Number of children: Not on file   Years of education: Not on file   Highest education level: Not on file  Occupational History   Not on file  Tobacco Use   Smoking status: Every Day    Current packs/day: 0.00    Average packs/day: 0.5 packs/day for 37.3 years (18.7 ttl pk-yrs)    Types: Cigarettes    Start date: 07/07/1981    Last attempt to quit: 11/05/2018    Years since quitting: 5.3   Smokeless tobacco: Never  Vaping Use   Vaping status: Never Used  Substance and Sexual Activity   Alcohol use: No    Comment: not since June 2012   Drug use: Yes    Types: Marijuana    Comment: patient denies any-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   Sexual activity: Never    Birth control/protection: Surgical  Other Topics Concern   Not on file  Social History Narrative   Not on file   Social Drivers of Health   Financial Resource Strain: Not on file  Food Insecurity: Food Insecurity Present (02/18/2024)   Hunger Vital Sign    Worried About Running Out of Food in the Last Year: Sometimes true    Ran Out of Food in the Last Year: Sometimes true  Transportation Needs: Unmet Transportation Needs (02/18/2024)   PRAPARE - Administrator, Civil Service (Medical): Yes    Lack of Transportation (Non-Medical): Yes  Physical Activity: Not on file  Stress: Not on file  Social Connections: Unknown (10/14/2021)   Received from [facility]   Social Connections    Do your friends and family support you?: Not on file    What agencies support you?: Not on file    Current Medications: Current Facility-Administered Medications  Medication Dose Route Frequency Provider Last Rate Last Admin    acetaminophen  (TYLENOL ) tablet 650 mg  650 mg Oral Q6H PRN White, Patrice L, NP   650 mg at 03/15/24 2056   alum & mag hydroxide-simeth (MAALOX/MYLANTA) 200-200-20 MG/5ML suspension 30 mL  30 mL Oral Q4H PRN White, Patrice L, NP   30 mL at 03/19/24 1845   benztropine  (COGENTIN ) tablet 1 mg  1 mg Oral BID White, Patrice L, NP   1 mg at 03/21/24 0817   divalproex  (DEPAKOTE ) DR tablet 750 mg  750 mg Oral Q12H Ji, Andrew, MD   750 mg at 03/21/24 0817   haloperidol  (HALDOL ) tablet  10 mg  10 mg Oral Q12H Ji, Andrew, MD   10 mg at 03/21/24 9182   hydrOXYzine  (ATARAX ) tablet 25 mg  25 mg Oral TID PRN White, Patrice L, NP   25 mg at 03/13/24 1524   LORazepam  (ATIVAN ) tablet 0.5 mg  0.5 mg Oral Q12H Pashayan, Alexander S, DO   0.5 mg at 03/21/24 9182   magnesium  hydroxide (MILK OF MAGNESIA) suspension 30 mL  30 mL Oral Daily PRN White, Patrice L, NP       nicotine  polacrilex (NICORETTE ) gum 2 mg  2 mg Oral PRN Trudy Carwin, NP   2 mg at 03/19/24 1942   OLANZapine  (ZYPREXA ) injection 10 mg  10 mg Intramuscular TID PRN White, Patrice L, NP       OLANZapine  (ZYPREXA ) injection 5 mg  5 mg Intramuscular TID PRN White, Patrice L, NP       OLANZapine  zydis (ZYPREXA ) disintegrating tablet 5 mg  5 mg Oral TID PRN White, Patrice L, NP   5 mg at 03/13/24 2221   ondansetron  (ZOFRAN -ODT) disintegrating tablet 4 mg  4 mg Oral Q8H PRN Lynnette Barter, MD   4 mg at 03/07/24 2205   traZODone  (DESYREL ) tablet 50 mg  50 mg Oral QHS White, Patrice L, NP   50 mg at 03/20/24 2038   ziprasidone  (GEODON ) capsule 40 mg  40 mg Oral BID WC Marry Clamp, MD   40 mg at 03/21/24 9182    Lab Results:  No results found for this or any previous visit (from the past 48 hours).   Blood Alcohol level:  Lab Results  Component Value Date   [facility] <15 02/17/2024   ETH <15 01/14/2024    Metabolic Disorder Labs: Lab Results  Component Value Date   HGBA1C 5.1 01/18/2024   MPG 99.67 01/18/2024   MPG 105.41 05/11/2019   Lab Results   Component Value Date   PROLACTIN 24.1 (H) 05/11/2019   Lab Results  Component Value Date   CHOL 154 01/18/2024   TRIG 180 (H) 01/18/2024   HDL 49 01/18/2024   CHOLHDL 3.1 01/18/2024   VLDL 36 01/18/2024   LDLCALC 69 01/18/2024   LDLCALC 82 05/11/2019    Mental Status exam: Appearance: white female of slightly elevated BMI, remains disheveled and with poor dentition, seen reclining in bed, sleeping and rouses briefly for limited interview  Eye contact: poor Attitude towards examiner  minimally responsive to questions Psychomotor: no agitation or retardation Speech: reduced amount, infrequent one-word responses  Language: simplistic  Mood: fine Affect: restricted in range, odd  Thought content: denying SI, HI, not overtly expressing delusional content Thought Process: remains with disorganized/confused TP on limited exam Perception: denying AVH, not overtly RTIS on limited exam Insight: poor  Judgement: poor   Orientation: to self  Attention/Concentration: limited due to psychosis  Memory/Cognition: not formally assessed   Fund of Knowledge: Average    Musculoskeletal: Strength & Muscle Tone: within normal limits Gait & Station: normal Patient leans: N/A    Physical Exam Constitutional:      Appearance: the patient is not toxic-appearing.  Pulmonary:     Effort: Pulmonary effort is normal.  Neurological:     General: No focal deficit present.     Mental Status: the patient is alert and oriented to person, place, and time.   Review of Systems  Respiratory:  Negative for shortness of breath.   Cardiovascular:  Negative for chest pain.  Gastrointestinal:  Negative for abdominal pain, constipation,  diarrhea, nausea and vomiting.  Neurological:  Negative for headaches.   Blood pressure (!) 110/91, pulse 82, temperature 97.6 F (36.4 C), temperature source Oral, resp. rate 20, height 5' 6 (1.676 m), weight 80.7 kg, SpO2 99%. Body mass index is 28.71  kg/m.   Treatment Plan Summary: Daily contact with patient to assess and evaluate symptoms and progress in treatment and Medication management  Assessment TYARRA NOLTON is a 53 yr old female who presented on 02/17/24 to [facility] ED with a chief complaint of anxiety. In the ED, she was found to be acutely psychotic with tangential speech, agitation, and delusional thought content. Patient was admitted involuntarily to [facility] on 8/14.  Although substance-induced psychosis has previously been documented, she additional presents with prior reported manic episodes. On this admission, she has presented with agitation and increased rate of speech, initially had significantly poor sleep and psychotic symptoms, disorganization of thoughts, behavior, concern for AVH, have been significant and unresponsive to antipsychotic medications. Given clear manic episode prior to hospitalization and during first days of hospitalization, working diagnosis is a bipolar 1 disorder. SAD-BT is also high on the differential given lingering significant psychotic symptoms that have not resolved. Numerous antipsychotic medications and mood stabilizers have been tried as noted below but she has remained with significant disorganization in thoughts and behavior, poor self care.   Staff have continued to documented disorganization in thoughts/behaviors, internal preoccupation and poor hygiene. Has been compliant with medications and in the last week or so has not required PRNs for agitation. She is very withdrawn and isolative to room. She remains with significant disorganization in thoughts and poor insight into her mental health concerns.  Although denying all concerns, including SI, HI and AVH, she presents as internally preoccupied and is not forthcoming with information. Remains disheveled with overall poor self care, although can shower if pushed to do so by NS. Given no significant improvements despite over a week of dual  antipsychotic medications at relatively high doses + mood stabilizing agent the plan remains transfer to a facility that offers ECT for higher level of care. OT did evaluate the patient as part of this process and noted no physical limitations but significant cognitive limitations requiring oversight on all IADLs and assistance with taking medications and some aspects of ADLs.   DSM-5 diagnoses: Bipolar 1 Disorder, current episode manic, severe, with psychotic features  -R/O SAD-BT given significant psychotic symptoms  History of substance induced psychotic disorder     Plan:   Psychiatric medications:  -Continue Haldol  10 mg q12 for psychosis and mood stability -Increase Geodon  from 40 mg bid to 60 mg bid -EKG 7/11 with NSR corrected Qt of 451. Will recheck after cross titration.  -Continue Depakote  DR 750 mg q12 for mood stability -Continue Ativan  1 mg q12 for agitation  -Continue Cogentin  1 mg BID for Drug Induced EPS -Continue Agitation Protocol: Zyprexa    Nicotine  Dependence: -Continue Nicotine  Gum 2 mg PRN  Additional PRNs -Continue Zofran  4 mg q8 PRN nausea/vomiting -Continue PRN's: Tylenol , Maalox, Atarax , Milk of Magnesia, Trazodone    --  The risks/benefits/side-effects/alternatives to medications were discussed in detail with the patient and time was given for questions. The patient consents to medication trials.                -- Metabolic profile and EKG monitoring obtained while on an atypical antipsychotic (BMI: 28.73 Lipid Panel: WNL except Trig: 180 HbgA1c: 5.1)              --  Encouraged patient to participate in unit milieu and in scheduled group therapies              -- Short Term Goals: Ability to identify changes in lifestyle to reduce recurrence of condition will improve, Ability to verbalize feelings will improve, Ability to disclose and discuss suicidal ideas, Ability to demonstrate self-control will improve, Ability to identify and develop effective coping  behaviors will improve, Ability to maintain clinical measurements within normal limits will improve, Compliance with prescribed medications will improve, and Ability to identify triggers associated with substance abuse/mental health issues will improve             -- Long Term Goals: Improvement in symptoms so as ready for discharge   Safety and Monitoring:             -- Involuntary admission to inpatient psychiatric unit for safety, stabilization and treatment             -- Daily contact with patient to assess and evaluate symptoms and progress in treatment             -- Patient's case to be discussed in multi-disciplinary team meeting             -- Observation Level : q15 minute checks             -- Vital signs:  q12 hours             -- Precautions: suicide, elopement, and assault  Discharge Planning:              -- Appreciate SW assistance with transfer to a facility that provides ECT given the patient requires a higher level of care.   Karleen Kaufmann, MD 03/21/2024, 2:08 PM

## 2024-03-21 NOTE — Group Note (Signed)
 Recreation Therapy Group Note   Group Topic:Communication  Group Date: 03/21/2024 Start Time: 1025 End Time: 1053 Facilitators: Aily Tzeng-McCall, LRT,CTRS Location: 500 Hall Dayroom   Group Topic: Communication, Team Building, Problem Solving  Goal Area(s) Addresses:  Patient will effectively work with peer towards shared goal.  Patient will identify skills used to make activity successful.  Patient will identify how skills used during activity can be applied to reach post d/c goals.   Behavioral Response:   Intervention: STEM Activity- Glass blower/designer  Activity: Tallest [employer]. In teams of 5-6, patients were given 11 craft pipe cleaners. Using the materials provided, patients were instructed to compete again the opposing team(s) to build the tallest free-standing structure from floor level. The activity was timed; difficulty increased by Clinical research associate as Production designer, theatre/television/film continued.  Systematically resources were removed with additional directions for example, placing one arm behind their back, working in silence, and shape stipulations. LRT facilitated post-activity discussion reviewing team processes and necessary communication skills involved in completion. Patients were encouraged to reflect how the skills utilized, or not utilized, in this activity can be incorporated to positively impact support systems post discharge.  Education: Pharmacist, community, Scientist, physiological, Discharge Planning   Education Outcome: Acknowledges education/In group clarification offered/Needs additional education.    Affect/Mood: N/A   Participation Level: Did not attend    Clinical Observations/Individualized Feedback:      Plan: Continue to engage patient in RT group sessions 2-3x/week.   Deeya Richeson-McCall, LRT,CTRS 03/21/2024 1:13 PM

## 2024-03-21 NOTE — Group Note (Signed)
 Date:  03/21/2024 Time:  8:37 PM  Group Topic/Focus:  Wrap-Up Group:   The focus of this group is to help patients review their daily goal of treatment and discuss progress on daily workbooks.    Participation Level:  Active  Participation Quality:  Appropriate  Affect:  Appropriate  Cognitive:  Oriented  Insight: Appropriate  Engagement in Group:  Engaged  Modes of Intervention:  Education and Exploration  Additional Comments:  Patient attended and participated in group tonight. She reports that tonight.  She reports that her goal for today was to sleep.  She did sleep today.  Gwenn Chillington Dacosta 03/21/2024, 8:37 PM

## 2024-03-21 NOTE — Progress Notes (Signed)
(  Sleep Hours) -7.5  (Any PRNs that were needed, meds refused, or side effects to meds)- Trazodone    (Any disturbances and when (visitation, over night)-none  (Concerns raised by the patient)- none  (SI/HI/AVH)- denied

## 2024-03-21 NOTE — Group Note (Signed)
 LCSW Group Therapy Note   Group Date: 03/21/2024 Start Time: 1100 End Time: 1200   Participation:  did not attend  Type of Therapy:  Group Therapy  Topic:  Healing From Within: Understanding Our Past, Building Our Future"  Objective:  To help participants understand the impact of early experiences on mental and physical health, with a focus on Adverse Childhood Experiences (ACEs), and to explore ways to build resilience and healing.  Summary: In today's session, we discussed how early experiences, especially ACEs, impact mental and physical health.  We explored the effects of stress, abuse, and neglect on brain development and well-being. The group focused on resilience, understanding that healing and positive change are possible with support and self-awareness.  Group Goals: Understand ACEs and Their Impact: Learn how childhood experiences shape mental and physical health. Build Resilience: Develop strategies for overcoming challenges and creating positive change. Promote Healing: Recognize the value of support and the possibility of healing through therapy and self-care.  Therapeutic Modalities Used: Psychoeducation: Sharing information about ACEs and their effects. Cognitive Behavioral Therapy (CBT): Helping reframe negative thought patterns. Trauma-Informed Therapy: Creating a safe, supportive space for healing.   Ellizabeth Dacruz O Virdie Penning, LCSWA 03/21/2024  4:15 PM

## 2024-03-21 NOTE — Group Note (Unsigned)
 Date:  03/21/2024 Time:  8:21 PM  Group Topic/Focus:  Wrap-Up Group:   The focus of this group is to help patients review their daily goal of treatment and discuss progress on daily workbooks.     Participation Level:  {BHH PARTICIPATION OZCZO:77735}  Participation Quality:  {BHH PARTICIPATION QUALITY:22265}  Affect:  {BHH AFFECT:22266}  Cognitive:  {BHH COGNITIVE:22267}  Insight: {BHH Insight2:20797}  Engagement in Group:  {BHH ENGAGEMENT IN HMNLE:77731}  Modes of Intervention:  {BHH MODES OF INTERVENTION:22269}  Additional Comments:  ***  Allison Bolton 03/21/2024, 8:21 PM

## 2024-03-21 NOTE — Group Note (Unsigned)
 Date:  03/21/2024 Time:  8:29 PM  Group Topic/Focus:  Wrap-Up Group:   The focus of this group is to help patients review their daily goal of treatment and discuss progress on daily workbooks.     Participation Level:  {BHH PARTICIPATION OZCZO:77735}  Participation Quality:  {BHH PARTICIPATION QUALITY:22265}  Affect:  {BHH AFFECT:22266}  Cognitive:  {BHH COGNITIVE:22267}  Insight: {BHH Insight2:20797}  Engagement in Group:  {BHH ENGAGEMENT IN HMNLE:77731}  Modes of Intervention:  {BHH MODES OF INTERVENTION:22269}  Additional Comments:  ***  Jenean Escandon Dacosta 03/21/2024, 8:29 PM

## 2024-03-22 DIAGNOSIS — F312 Bipolar disorder, current episode manic severe with psychotic features: Secondary | ICD-10-CM | POA: Diagnosis not present

## 2024-03-22 MED ORDER — LORAZEPAM 0.5 MG PO TABS: 0.2500 mg | ORAL_TABLET | Freq: Every day | ORAL | Status: DC

## 2024-03-22 MED ORDER — LORAZEPAM 0.5 MG PO TABS
0.5000 mg | ORAL_TABLET | Freq: Every day | ORAL | Status: DC
  Administered 2024-03-22 – 2024-04-04 (×14): 0.5 mg via ORAL
  Filled 2024-03-22 (×14): qty 1

## 2024-03-22 NOTE — Group Note (Signed)
 Recreation Therapy Group Note   Group Topic:Health and Wellness  Group Date: 03/22/2024 Start Time: 1034 End Time: 1104 Facilitators: Ysabel Cowgill-McCall, LRT,CTRS Location: 500 Hall Dayroom   Group Topic: Wellness  Goal Area(s) Addresses:  Patient will define components of whole wellness. Patient will verbalize benefit of whole wellness.  Behavioral Response:   Intervention: Meditation Music, Guided Imagery  Activity: LRT and patients discussed the importance of wellness and its impact on an individuals overall well-being. LRT and patients went through a series of yoga moves to stretch and engage muscles. LRT then played the mountain meditation. This meditation focused on taking on the aspects/structure of a mountain. In that visualization, patients were to see themselves being able to withstand anything that comes against them just like the mountain can withstand different types of weather or any pricks that may happen to it.   Education: Wellness, Building control surveyor.   Education Outcome: Acknowledges education/In group clarification offered/Needs additional education.    Affect/Mood: N/A   Participation Level: Did not attend    Clinical Observations/Individualized Feedback:      Plan: Continue to engage patient in RT group sessions 2-3x/week.   Verniece Encarnacion-McCall, LRT,CTRS 03/22/2024 1:28 PM

## 2024-03-22 NOTE — Progress Notes (Addendum)
 Type of Note:  IVC Court Hearing     Patient participated in the court hearing.   The IVC has been approved for an additional 14 days.     Nykeria Mealing, LCSWA 03/22/2024

## 2024-03-22 NOTE — Progress Notes (Addendum)
 Collateral contact - re:  Electroconvulsive therapy (ECT)    [facility] [address], [place], [place] [address] Phone:  [phone] x [phone]  Fax: [phone]  There is no bed available today.  CSW was advised to call tomorrow.  Tomorrow, Wednesday, 9/17, CSW would need to fax [facility], nursing notes, and psychiatrist's notes.    [facility] Electroconvulsive therapy (ECT) [address] [place], [place], [place] [address] [phone], x [phone], ID # [id number], input call back number#. Fax: [phone]  They don't have any beds available today.   [facility] [address], [place], [place] [address] [phone]   CSW received a voicemail from Ms. Katheryn Slack, ECT Coordinator, [phone] requesting to fax documents to [phone].  CSW faxed the documents, and left a voicemail informing her that they had been sent.   Tisha Cline, LCSWA 03/22/2024

## 2024-03-22 NOTE — BHH Group Notes (Signed)
 Adult Psychoeducational Group Note  Date:  03/22/2024 Time:  8:30 PM  Group Topic/Focus:  Wrap-Up Group:   The focus of this group is to help patients review their daily goal of treatment and discuss progress on daily workbooks.  Participation Level:  Active  Participation Quality:  Appropriate  Affect:  Appropriate  Cognitive:  Appropriate  Insight: Appropriate  Engagement in Group:  Engaged  Modes of Intervention:  Discussion and Support  Additional Comments:  Pt told that today was a good day on the unit, the highlight of which was sleeping a lot. On the subject of ways to stay well upon discharge, Pt mentioned spending time with my daughter. Pt rated her day a 5 out of 10.  Allison Bolton 03/22/2024, 8:30 PM

## 2024-03-22 NOTE — Progress Notes (Signed)
 Food log started per Dr order. Pt had 0.5 oz popcorn, 2 cups of pudding , 12 oz Gatorade for snack.

## 2024-03-22 NOTE — Plan of Care (Signed)
   Problem: Education: Goal: Knowledge of [place] [facility] Education information/materials will improve Outcome: Progressing Goal: Emotional status will improve Outcome: Progressing Goal: Mental status will improve Outcome: Progressing

## 2024-03-22 NOTE — Progress Notes (Addendum)
 [facility] MD Progress Note  03/22/2024 10:53 AM Allison Bolton  MRN:  [id number]  Principal Problem: Bipolar affective disorder, current episode manic with psychotic symptoms (HCC) Diagnosis: Principal Problem:   Bipolar affective disorder, current episode manic with psychotic symptoms (HCC)   Total Time spent with patient:  I personally spent 20 minutes on the unit in direct patient care. The direct patient care time included face-to-face time with the patient, reviewing the patient's chart, communicating with other professionals, and coordinating care.   Identifying Information and brief psychiatric history:  Allison Bolton is a 53 yr old female with working psychiatric diagnoses of bipolar 1 disorder (vs SAD-BT) and a history of prior documented substance-induced psychosis. She does have a history of prior psychiatric hospitalizations with substance use driving psychotic symptoms, however on this admission has presented with clear manic and psychotic symptoms that have not cleared despite time and medications. She has one prior suicide Attempt (2007), and Prior Psychiatric Hospitalizations (last- [facility] 01/2024).  On this admission she presented to [facility] ED on 02/17/24  with a chief complaint of anxiety. In the ED, she was found to be acutely psychotic with tangential speech, agitation, and delusional thought content. Patient was admitted involuntarily to [facility] on 8/14. Since admission the patient has been notably unresponsive to antipsychotic medications - remaining acutely psychotic and plan has been for transfer to facility that provides ECT for treatment of refractory mania with psychosis.    Interval events: No prn agitation medication needed. Slept excessively.  Interview today:  Patient somewhat somnolent today though is awake when this provider went to speak with her. She is more willing to speak today than previous days. She can answer orientation questions appropriately. Asked  her what caused her to come into the hospital and she pointedly refused that it had anything to do with drugs of abuse.  No apparent psychotic symptoms. Agrees to go to gym/outside today.     Past Medical History:  Past Medical History:  Diagnosis Date   Anxiety    Arthritis    Asthma    Bipolar 1 disorder (HCC)    Diverticulosis    Dysrhythmia    sts I have heart palpitations   Fibromyalgia    H/O hiatal hernia    4   Headache(784.0)    High blood pressure    Meniere's disease    S/P colonoscopy    Dr. Golda 2010: few small diverticula at sigmoid. otherwise normal.    Shortness of breath     Past Surgical History:  Procedure Laterality Date   ABDOMINAL HYSTERECTOMY     APPENDECTOMY     CESAREAN SECTION     CHOLECYSTECTOMY     COLONOSCOPY N/A 12/25/2023   Procedure: COLONOSCOPY;  Surgeon: Eartha Angelia Sieving, MD;  Location: [facility];  Service: Gastroenterology;  Laterality: N/A;  11:30am, asa 1   exploratory laparoscopy     FOOT SURGERY     HEMORRHOID SURGERY     LAPAROSCOPIC APPENDECTOMY  02/19/2011   Procedure: APPENDECTOMY LAPAROSCOPIC;  Surgeon: Oneil DELENA Budge;  Location: [facility] ORS;  Service: General;  Laterality: N/A;   LAPAROSCOPY  02/19/2011   Procedure: LAPAROSCOPY DIAGNOSTIC;  Surgeon: Oneil DELENA Budge;  Location: [facility] ORS;  Service: General;  Laterality: N/A;   left ovarian removal     multiple hernia repairs     Right ovarian removal     TONSILLECTOMY     TONSILLECTOMY AND ADENOIDECTOMY     tubes in ears  UMBILICAL HERNIA REPAIR  Dec 2011   Dr. Mavis   wisdom teeth removal     Family History:  Family History  Problem Relation Age of Onset   Thyroid  disease Mother    Colon cancer Father    Breast cancer Maternal Aunt    Colon cancer Brother    Colon cancer Other        paternal and maternal grandfather   Meniere's disease Other    Anesthesia problems Neg Hx    Hypotension Neg Hx    Malignant hyperthermia Neg Hx    Pseudochol deficiency  Neg Hx    Family Psychiatric  History:  None Reported   Social History:  Social History   Substance and Sexual Activity  Alcohol Use No   Comment: not since June 2012     Social History   Substance and Sexual Activity  Drug Use Yes   Types: Marijuana   Comment: patient denies any-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    Social History   Socioeconomic History   Marital status: Legally Separated    Spouse name: Not on file   Number of children: Not on file   Years of education: Not on file   Highest education level: Not on file  Occupational History   Not on file  Tobacco Use   Smoking status: Every Day    Current packs/day: 0.00    Average packs/day: 0.5 packs/day for 37.3 years (18.7 ttl pk-yrs)    Types: Cigarettes    Start date: 07/07/1981    Last attempt to quit: 11/05/2018    Years since quitting: 5.3   Smokeless tobacco: Never  Vaping Use   Vaping status: Never Used  Substance and Sexual Activity   Alcohol use: No    Comment: not since June 2012   Drug use: Yes    Types: Marijuana    Comment: patient denies any-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   Sexual activity: Never    Birth control/protection: Surgical  Other Topics Concern   Not on file  Social History Narrative   Not on file   Social Drivers of Health   Financial Resource Strain: Not on file  Food Insecurity: Food Insecurity Present (02/18/2024)   Hunger Vital Sign    Worried About Running Out of Food in the Last Year: Sometimes true    Ran Out of Food in the Last Year: Sometimes true  Transportation Needs: Unmet Transportation Needs (02/18/2024)   PRAPARE - Administrator, Civil Service (Medical): Yes    Lack of Transportation (Non-Medical): Yes  Physical Activity: Not on file  Stress: Not on file  Social Connections: Unknown (10/14/2021)   Received from [facility]   Social Connections    Do your friends and family support you?: Not on file    What  agencies support you?: Not on file    Current Medications: Current Facility-Administered Medications  Medication Dose Route Frequency Provider Last Rate Last Admin   acetaminophen  (TYLENOL ) tablet 650 mg  650 mg Oral Q6H PRN White, Patrice L, NP   650 mg at 03/15/24 2056   alum & mag hydroxide-simeth (MAALOX/MYLANTA) 200-200-20 MG/5ML suspension 30 mL  30 mL Oral Q4H PRN White, Patrice L, NP   30 mL at 03/19/24 1845   benztropine  (COGENTIN ) tablet 1 mg  1 mg Oral BID White, Patrice L, NP   1 mg at 03/22/24 0826   divalproex  (DEPAKOTE ) DR  tablet 750 mg  750 mg Oral Q12H Ji, Andrew, MD   750 mg at 03/22/24 0825   haloperidol  (HALDOL ) tablet 10 mg  10 mg Oral Q12H Ji, Andrew, MD   10 mg at 03/22/24 9173   hydrOXYzine  (ATARAX ) tablet 25 mg  25 mg Oral TID PRN White, Patrice L, NP   25 mg at 03/13/24 1524   LORazepam  (ATIVAN ) tablet 0.5 mg  0.5 mg Oral Q12H Pashayan, Alexander S, DO   0.5 mg at 03/22/24 9173   magnesium  hydroxide (MILK OF MAGNESIA) suspension 30 mL  30 mL Oral Daily PRN White, Patrice L, NP       nicotine  polacrilex (NICORETTE ) gum 2 mg  2 mg Oral PRN Trudy Carwin, NP   2 mg at 03/19/24 1942   OLANZapine  (ZYPREXA ) injection 10 mg  10 mg Intramuscular TID PRN White, Patrice L, NP       OLANZapine  (ZYPREXA ) injection 5 mg  5 mg Intramuscular TID PRN White, Patrice L, NP       OLANZapine  zydis (ZYPREXA ) disintegrating tablet 5 mg  5 mg Oral TID PRN White, Patrice L, NP   5 mg at 03/13/24 2221   ondansetron  (ZOFRAN -ODT) disintegrating tablet 4 mg  4 mg Oral Q8H PRN Lynnette Barter, MD   4 mg at 03/07/24 2205   traZODone  (DESYREL ) tablet 50 mg  50 mg Oral QHS White, Patrice L, NP   50 mg at 03/21/24 2040   ziprasidone  (GEODON ) capsule 60 mg  60 mg Oral BID WC Marry Clamp, MD   60 mg at 03/22/24 9173    Lab Results:  No results found for this or any previous visit (from the past 48 hours).   Blood Alcohol level:  Lab Results  Component Value Date   [facility] <15 02/17/2024   ETH <15  01/14/2024    Metabolic Disorder Labs: Lab Results  Component Value Date   HGBA1C 5.1 01/18/2024   MPG 99.67 01/18/2024   MPG 105.41 05/11/2019   Lab Results  Component Value Date   PROLACTIN 24.1 (H) 05/11/2019   Lab Results  Component Value Date   CHOL 154 01/18/2024   TRIG 180 (H) 01/18/2024   HDL 49 01/18/2024   CHOLHDL 3.1 01/18/2024   VLDL 36 01/18/2024   LDLCALC 69 01/18/2024   LDLCALC 82 05/11/2019    Mental Status exam: Appearance: white female of slightly elevated BMI, remains disheveled and with poor dentition, seen reclining in bed, sleeping and rouses briefly for limited interview  Eye contact: poor Attitude towards examiner  minimally responsive to questions Psychomotor: no agitation or retardation Speech: reduced amount, infrequent one-word responses  Language: simplistic  Mood: fine Affect: restricted in range, odd  Thought content: denying SI, HI, not overtly expressing delusional content Thought Process: remains with disorganized/confused TP on limited exam Perception: denying AVH, not overtly RTIS on limited exam Insight: poor  Judgement: poor   Orientation: to self  Attention/Concentration: limited due to psychosis  Memory/Cognition: not formally assessed   Fund of Knowledge: Average    Musculoskeletal: Strength & Muscle Tone: within normal limits Gait & Station: normal Patient leans: N/A    Physical Exam Constitutional:      Appearance: the patient is not toxic-appearing.  Pulmonary:     Effort: Pulmonary effort is normal.  Neurological:     General: No focal deficit present.     Mental Status: the patient is alert and oriented to person, place, and time.   Review of Systems  Respiratory:  Negative for shortness of breath.   Cardiovascular:  Negative for chest pain.  Gastrointestinal:  Negative for abdominal pain, constipation, diarrhea, nausea and vomiting.  Neurological:  Negative for headaches.   Blood pressure 91/74, pulse  78, temperature 97.6 F (36.4 C), temperature source Oral, resp. rate 20, height 5' 6 (1.676 m), weight 80.7 kg, SpO2 98%. Body mass index is 28.71 kg/m.   Treatment Plan Summary: Daily contact with patient to assess and evaluate symptoms and progress in treatment and Medication management  Assessment Allison Bolton is a 53 yr old female who presented on 02/17/24 to [facility] ED with a chief complaint of anxiety. In the ED, she was found to be acutely psychotic with tangential speech, agitation, and delusional thought content. Patient was admitted involuntarily to [facility] on 8/14.  Although substance-induced psychosis has previously been documented, she additional presents with prior reported manic episodes. On this admission, she has presented with agitation and increased rate of speech, initially had significantly poor sleep and psychotic symptoms, disorganization of thoughts, behavior, concern for AVH, have been significant and unresponsive to antipsychotic medications. Given clear manic episode prior to hospitalization and during first days of hospitalization, working diagnosis is a bipolar 1 disorder. SAD-BT is also high on the differential given lingering significant psychotic symptoms that have not resolved. Numerous antipsychotic medications and mood stabilizers have been tried as noted below but she has remained with significant disorganization in thoughts and behavior, poor self care.   Staff have continued to documented disorganization in thoughts/behaviors, internal preoccupation and poor hygiene. Has been compliant with medications and in the last week or so has not required PRNs for agitation. She is very withdrawn and isolative to room. She remains with significant disorganization in thoughts and poor insight into her mental health concerns.  Although denying all concerns, including SI, HI and AVH, she presents as internally preoccupied and is not forthcoming with information. Remains  disheveled with overall poor self care, although can shower if pushed to do so by NS. Given no significant improvements despite over a week of dual antipsychotic medications at relatively high doses + mood stabilizing agent the plan remains transfer to a facility that offers ECT for higher level of care. OT did evaluate the patient as part of this process and noted no physical limitations but significant cognitive limitations requiring oversight on all IADLs and assistance with taking medications and some aspects of ADLs.   DSM-5 diagnoses: Bipolar 1 Disorder, current episode manic, severe, with psychotic features  -R/O SAD-BT given significant psychotic symptoms  History of substance induced psychotic disorder     Plan:   Psychiatric medications:  -Continue Haldol  10 mg q12 for psychosis and mood stability -Cont Geodon  60 mg bid -EKG 7/11 with NSR corrected Qt of 451. Will recheck after cross titration.  -Continue Depakote  DR 750 mg q12 for mood stability -Reduce Ativan  0.5 mg bid to 0.5 at bedtime -Continue Cogentin  1 mg BID for Drug Induced EPS -Continue Agitation Protocol: Zyprexa    Nicotine  Dependence: -Continue Nicotine  Gum 2 mg PRN  Additional PRNs -Continue Zofran  4 mg q8 PRN nausea/vomiting -Continue PRN's: Tylenol , Maalox, Atarax , Milk of Magnesia, Trazodone    --  The risks/benefits/side-effects/alternatives to medications were discussed in detail with the patient and time was given for questions. The patient consents to medication trials.                -- Metabolic profile and EKG monitoring obtained while on an atypical antipsychotic (BMI:  28.73 Lipid Panel: WNL except Trig: 180 HbgA1c: 5.1)              -- Encouraged patient to participate in unit milieu and in scheduled group therapies              -- Short Term Goals: Ability to identify changes in lifestyle to reduce recurrence of condition will improve, Ability to verbalize feelings will improve, Ability to  disclose and discuss suicidal ideas, Ability to demonstrate self-control will improve, Ability to identify and develop effective coping behaviors will improve, Ability to maintain clinical measurements within normal limits will improve, Compliance with prescribed medications will improve, and Ability to identify triggers associated with substance abuse/mental health issues will improve             -- Long Term Goals: Improvement in symptoms so as ready for discharge   Safety and Monitoring:             -- Involuntary admission to inpatient psychiatric unit for safety, stabilization and treatment             -- Daily contact with patient to assess and evaluate symptoms and progress in treatment             -- Patient's case to be discussed in multi-disciplinary team meeting             -- Observation Level : q15 minute checks             -- Vital signs:  q12 hours             -- Precautions: suicide, elopement, and assault  Discharge Planning:              -- Appreciate SW assistance with transfer to a facility that provides ECT given the patient requires a higher level of care.   Karleen Kaufmann, MD 03/22/2024, 10:53 AM

## 2024-03-22 NOTE — Progress Notes (Signed)
(  Sleep Hours) -6.25  (Any PRNs that were needed, meds refused, or side effects to meds)- none  (Any disturbances and when (visitation, over night)-none  (Concerns raised by the patient)- none  (SI/HI/AVH)- denies

## 2024-03-23 ENCOUNTER — Encounter (HOSPITAL_COMMUNITY): Payer: Self-pay

## 2024-03-23 DIAGNOSIS — F312 Bipolar disorder, current episode manic severe with psychotic features: Secondary | ICD-10-CM | POA: Diagnosis not present

## 2024-03-23 DIAGNOSIS — Z8659 Personal history of other mental and behavioral disorders: Secondary | ICD-10-CM

## 2024-03-23 NOTE — Progress Notes (Signed)
(  Sleep Hours) -5.75  (Any PRNs that were needed, meds refused, or side effects to meds)- Vistaril  25 mg , Maalox  (Any disturbances and when (visitation, over night)- N/A  (Concerns raised by the patient)- Pt stated she had a good day , I missed you today pt pleasant on the unit this evening and seen in the dayroom some this evening and went to group.   (SI/HI/AVH)-denies

## 2024-03-23 NOTE — Group Note (Unsigned)
 Date:  03/24/2024 Time:  5:20 AM  Group Topic/Focus:  Wrap-Up Group:   The focus of this group is to help patients review their daily goal of treatment and discuss progress on daily workbooks.    Participation Level:  Minimal  Participation Quality:  Appropriate and Sharing  Affect:  Appropriate and Flat  Cognitive:  Appropriate  Insight: Appropriate and Limited  Engagement in Group:  Engaged and Limited  Modes of Intervention:  Discussion and Socialization  Additional Comments:  Patient shared that she had a good day. Patient shared that she went outside today and walked around. Patient also shared that she colored and read today which was one of her goals. Patient rated her day a 7 out of 10.  Eward Mace 03/24/2024, 5:20 AM

## 2024-03-23 NOTE — Progress Notes (Signed)
 Collateral contact - Winton Sheller (aunt) [phone]   Aunt said that she can pick up patient upon discharge.  She said it would be difficult during the week, as she works Monday - Friday 9 AM - 5:30 PM and has to cook dinner for her family, however could pick her up on Saturday or Sunday.   Rickiya Picariello, LCSWA 03/23/2024

## 2024-03-23 NOTE — Plan of Care (Signed)
   Problem: Education: Goal: Emotional status will improve Outcome: Progressing Goal: Mental status will improve Outcome: Progressing Goal: Verbalization of understanding the information provided will improve Outcome: Progressing
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
## 2024-03-23 NOTE — Plan of Care (Signed)
   Problem: Education: Goal: Emotional status will improve Outcome: Progressing Goal: Mental status will improve Outcome: Progressing   Problem: Activity: Goal: Interest or engagement in activities will improve Outcome: Progressing Goal: Sleeping patterns will improve Outcome: Progressing   Problem: Safety: Goal: Periods of time without injury will increase Outcome: Progressing

## 2024-03-23 NOTE — Group Note (Signed)
 Recreation Therapy Group Note   Group Topic:Leisure Education  Group Date: 03/23/2024 Start Time: 1010 End Time: 1035 Facilitators: Elin Seats-McCall, LRT,CTRS Location: 500 Hall Dayroom   Group Topic: Leisure Education  Goal Area(s) Addresses:  Patient will identify positive leisure activities for use post discharge. Patient will identify at least one positive benefit of participation in leisure activities.  Patient will work effectively work with peers to keep the ball in play.  Behavioral Response:    Intervention: [employer], Music   Activity: Patients were to sit in a circle. Patients would toss a beach ball to each other keeping the ball in motion. LRT would time the group to see how long they could keep the ball moving. Patients could bounce or roll the ball but it could not come to a stop. If the ball were to stop moving, LRT would start the timer over.  Education:  Leisure Scientist, physiological, Special educational needs teacher, Teamwork, Discharge Planning  Education Outcome: Acknowledges education/In group clarification offered/Needs additional education.    Affect/Mood: N/A   Participation Level: Did not attend    Clinical Observations/Individualized Feedback:      Plan: Continue to engage patient in RT group sessions 2-3x/week.   Cecile Guevara-McCall, LRT,CTRS 03/23/2024 1:16 PM

## 2024-03-23 NOTE — Progress Notes (Signed)
 [facility] MD Progress Note  03/23/2024 4:20 PM Allison Bolton  MRN:  [id number]  Principal Problem: Bipolar affective disorder, current episode manic with psychotic symptoms (HCC) Diagnosis: Principal Problem:   Bipolar affective disorder, current episode manic with psychotic symptoms (HCC)   Total Time spent with patient:  I personally spent 20 minutes on the unit in direct patient care. The direct patient care time included face-to-face time with the patient, reviewing the patient's chart, communicating with other professionals, and coordinating care.   Identifying Information and brief psychiatric history:  Allison Bolton is a 53 yr old female with working psychiatric diagnoses of bipolar 1 disorder (vs SAD-BT) and a history of prior documented substance-induced psychosis. She does have a history of prior psychiatric hospitalizations with substance use driving psychotic symptoms, however on this admission has presented with clear manic and psychotic symptoms that have not cleared despite time and medications. She has one prior suicide Attempt (2007), and Prior Psychiatric Hospitalizations (last- [facility] 01/2024).  On this admission she presented to [facility] ED on 02/17/24  with a chief complaint of anxiety. In the ED, she was found to be acutely psychotic with tangential speech, agitation, and delusional thought content. Patient was admitted involuntarily to [facility] on 8/14. Since admission the patient has been notably unresponsive to antipsychotic medications - remaining acutely psychotic and plan has been for transfer to facility that provides ECT for treatment of refractory mania with psychosis.    Interval events: No prn agitation medication needed. Slept excessively.  Interview today:  The patient remains significantly disorganized on assessment.  She is irritable, though does sit up and converse when asked (an improvement compared to previous assessments).  Her attention is poor,  especially on testing.  Occupational therapist to perform assessment today.  Patient has no complaints.    Past Medical History:  Past Medical History:  Diagnosis Date   Anxiety    Arthritis    Asthma    Bipolar 1 disorder (HCC)    Diverticulosis    Dysrhythmia    sts I have heart palpitations   Fibromyalgia    H/O hiatal hernia    4   Headache(784.0)    High blood pressure    Meniere's disease    S/P colonoscopy    Dr. Golda 2010: few small diverticula at sigmoid. otherwise normal.    Shortness of breath     Past Surgical History:  Procedure Laterality Date   ABDOMINAL HYSTERECTOMY     APPENDECTOMY     CESAREAN SECTION     CHOLECYSTECTOMY     COLONOSCOPY N/A 12/25/2023   Procedure: COLONOSCOPY;  Surgeon: Eartha Angelia Sieving, MD;  Location: [facility] ENDO SUITE;  Service: Gastroenterology;  Laterality: N/A;  11:30am, asa 1   exploratory laparoscopy     FOOT SURGERY     HEMORRHOID SURGERY     LAPAROSCOPIC APPENDECTOMY  02/19/2011   Procedure: APPENDECTOMY LAPAROSCOPIC;  Surgeon: Oneil DELENA Budge;  Location: [facility] ORS;  Service: General;  Laterality: N/A;   LAPAROSCOPY  02/19/2011   Procedure: LAPAROSCOPY DIAGNOSTIC;  Surgeon: Oneil DELENA Budge;  Location: [facility] ORS;  Service: General;  Laterality: N/A;   left ovarian removal     multiple hernia repairs     Right ovarian removal     TONSILLECTOMY     TONSILLECTOMY AND ADENOIDECTOMY     tubes in ears     UMBILICAL HERNIA REPAIR  Dec 2011   Dr. Budge   wisdom teeth removal  Family History:  Family History  Problem Relation Age of Onset   Thyroid  disease Mother    Colon cancer Father    Breast cancer Maternal Aunt    Colon cancer Brother    Colon cancer Other        paternal and maternal grandfather   Meniere's disease Other    Anesthesia problems Neg Hx    Hypotension Neg Hx    Malignant hyperthermia Neg Hx    Pseudochol deficiency Neg Hx    Family Psychiatric  History:  None Reported   Social History:  Social  History   Substance and Sexual Activity  Alcohol Use No   Comment: not since June 2012     Social History   Substance and Sexual Activity  Drug Use Yes   Types: Marijuana   Comment: patient denies any-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    Social History   Socioeconomic History   Marital status: Legally Separated    Spouse name: Not on file   Number of children: Not on file   Years of education: Not on file   Highest education level: Not on file  Occupational History   Not on file  Tobacco Use   Smoking status: Every Day    Current packs/day: 0.00    Average packs/day: 0.5 packs/day for 37.3 years (18.7 ttl pk-yrs)    Types: Cigarettes    Start date: 07/07/1981    Last attempt to quit: 11/05/2018    Years since quitting: 5.3   Smokeless tobacco: Never  Vaping Use   Vaping status: Never Used  Substance and Sexual Activity   Alcohol use: No    Comment: not since June 2012   Drug use: Yes    Types: Marijuana    Comment: patient denies any-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   Sexual activity: Never    Birth control/protection: Surgical  Other Topics Concern   Not on file  Social History Narrative   Not on file   Social Drivers of Health   Financial Resource Strain: Not on file  Food Insecurity: Food Insecurity Present (02/18/2024)   Hunger Vital Sign    Worried About Running Out of Food in the Last Year: Sometimes true    Ran Out of Food in the Last Year: Sometimes true  Transportation Needs: Unmet Transportation Needs (02/18/2024)   PRAPARE - Administrator, Civil Service (Medical): Yes    Lack of Transportation (Non-Medical): Yes  Physical Activity: Not on file  Stress: Not on file  Social Connections: Unknown (10/14/2021)   Received from [facility]   Social Connections    Do your friends and family support you?: Not on file    What agencies support you?: Not on file    Current Medications: Current  Facility-Administered Medications  Medication Dose Route Frequency Provider Last Rate Last Admin   acetaminophen  (TYLENOL ) tablet 650 mg  650 mg Oral Q6H PRN White, Patrice L, NP   650 mg at 03/22/24 1901   alum & mag hydroxide-simeth (MAALOX/MYLANTA) 200-200-20 MG/5ML suspension 30 mL  30 mL Oral Q4H PRN White, Patrice L, NP   30 mL at 03/22/24 2244   benztropine  (COGENTIN ) tablet 1 mg  1 mg Oral BID White, Patrice L, NP   1 mg at 03/23/24 0754   divalproex  (DEPAKOTE ) DR tablet 750 mg  750 mg Oral Q12H Lynnette Barter, MD   750 mg at 03/23/24 [phone]  haloperidol  (HALDOL ) tablet 10 mg  10 mg Oral Q12H Lynnette Barter, MD   10 mg at 03/23/24 0754   hydrOXYzine  (ATARAX ) tablet 25 mg  25 mg Oral TID PRN White, Patrice L, NP   25 mg at 03/13/24 1524   LORazepam  (ATIVAN ) tablet 0.5 mg  0.5 mg Oral QHS Marry Clamp, MD   0.5 mg at 03/22/24 2036   magnesium  hydroxide (MILK OF MAGNESIA) suspension 30 mL  30 mL Oral Daily PRN White, Patrice L, NP       nicotine  polacrilex (NICORETTE ) gum 2 mg  2 mg Oral PRN Trudy Carwin, NP   2 mg at 03/23/24 1324   OLANZapine  (ZYPREXA ) injection 10 mg  10 mg Intramuscular TID PRN White, Patrice L, NP       OLANZapine  (ZYPREXA ) injection 5 mg  5 mg Intramuscular TID PRN White, Patrice L, NP       OLANZapine  zydis (ZYPREXA ) disintegrating tablet 5 mg  5 mg Oral TID PRN White, Patrice L, NP   5 mg at 03/13/24 2221   ondansetron  (ZOFRAN -ODT) disintegrating tablet 4 mg  4 mg Oral Q8H PRN Lynnette Barter, MD   4 mg at 03/07/24 2205   traZODone  (DESYREL ) tablet 50 mg  50 mg Oral QHS White, Patrice L, NP   50 mg at 03/22/24 2035   ziprasidone  (GEODON ) capsule 60 mg  60 mg Oral BID WC Marry Clamp, MD   60 mg at 03/23/24 0754    Lab Results:  No results found for this or any previous visit (from the past 48 hours).   Blood Alcohol level:  Lab Results  Component Value Date   [facility] <15 02/17/2024   ETH <15 01/14/2024    Metabolic Disorder Labs: Lab Results  Component Value Date    HGBA1C 5.1 01/18/2024   MPG 99.67 01/18/2024   MPG 105.41 05/11/2019   Lab Results  Component Value Date   PROLACTIN 24.1 (H) 05/11/2019   Lab Results  Component Value Date   CHOL 154 01/18/2024   TRIG 180 (H) 01/18/2024   HDL 49 01/18/2024   CHOLHDL 3.1 01/18/2024   VLDL 36 01/18/2024   LDLCALC 69 01/18/2024   LDLCALC 82 05/11/2019    Mental Status exam: Appearance: white female of slightly elevated BMI, remains disheveled and with poor dentition, seen reclining in bed, sleeping and rouses briefly for limited interview  Eye contact: poor Attitude towards examiner  minimally responsive to questions Psychomotor: no agitation or retardation Speech: reduced amount, infrequent one-word responses  Language: simplistic  Mood: fine Affect: restricted in range, odd  Thought content: denying SI, HI, not overtly expressing delusional content Thought Process: remains with disorganized/confused TP on limited exam Perception: denying AVH, not overtly RTIS on limited exam Insight: poor  Judgement: poor   Orientation: to self  Attention/Concentration: limited due to psychosis  Memory/Cognition: not formally assessed   Fund of Knowledge: Average    Musculoskeletal: Strength & Muscle Tone: within normal limits Gait & Station: normal Patient leans: N/A    Physical Exam Constitutional:      Appearance: the patient is not toxic-appearing.  Pulmonary:     Effort: Pulmonary effort is normal.  Neurological:     General: No focal deficit present.     Mental Status: the patient is alert and oriented to person, place, and time.   Review of Systems  Respiratory:  Negative for shortness of breath.   Cardiovascular:  Negative for chest pain.  Gastrointestinal:  Negative for abdominal  pain, constipation, diarrhea, nausea and vomiting.  Neurological:  Negative for headaches.   Blood pressure (!) 150/98, pulse 90, temperature 97.6 F (36.4 C), temperature source Oral, resp. rate 20,  height 5' 6 (1.676 m), weight 80.7 kg, SpO2 100%. Body mass index is 28.71 kg/m.   Treatment Plan Summary: Daily contact with patient to assess and evaluate symptoms and progress in treatment and Medication management  Assessment GENA LASKI is a 53 yr old female who presented on 02/17/24 to [facility] ED with a chief complaint of anxiety. In the ED, she was found to be acutely psychotic with tangential speech, agitation, and delusional thought content. Patient was admitted involuntarily to [facility] on 8/14.  Although substance-induced psychosis has previously been documented, she additional presents with prior reported manic episodes. On this admission, she has presented with agitation and increased rate of speech, initially had significantly poor sleep and psychotic symptoms, disorganization of thoughts, behavior, concern for AVH, have been significant and unresponsive to antipsychotic medications. Given clear manic episode prior to hospitalization and during first days of hospitalization, working diagnosis is a bipolar 1 disorder. SAD-BT is also high on the differential given lingering significant psychotic symptoms that have not resolved. Numerous antipsychotic medications and mood stabilizers have been tried as noted below but she has remained with significant disorganization in thoughts and behavior, poor self care.   Staff have continued to documented disorganization in thoughts/behaviors, internal preoccupation and poor hygiene. Has been compliant with medications and in the last week or so has not required PRNs for agitation. She is very withdrawn and isolative to room. She remains with significant disorganization in thoughts and poor insight into her mental health concerns.  Although denying all concerns, including SI, HI and AVH, she presents as internally preoccupied and is not forthcoming with information. Remains disheveled with overall poor self care, although can shower if pushed to do  so by NS. Given no significant improvements despite over a week of dual antipsychotic medications at relatively high doses + mood stabilizing agent the plan remains transfer to a facility that offers ECT for higher level of care. OT did evaluate the patient as part of this process and noted no physical limitations but significant cognitive limitations requiring oversight on all IADLs and assistance with taking medications and some aspects of ADLs.   DSM-5 diagnoses: Bipolar 1 Disorder, current episode manic, severe, with psychotic features  -R/O SAD-BT given significant psychotic symptoms  History of substance induced psychotic disorder     Plan:   Psychiatric medications:  -Continue Haldol  10 mg q12 for psychosis and mood stability -Cont Geodon  60 mg bid -EKG 7/11 with NSR corrected Qt of 451. Will recheck after cross titration.  -Continue Depakote  DR 750 mg q12 for mood stability - Continue Ativan  at reduced dose of 0.5 mg nightly -Continue Cogentin  1 mg BID for Drug Induced EPS -Continue Agitation Protocol: Zyprexa    Nicotine  Dependence: -Continue Nicotine  Gum 2 mg PRN  Additional PRNs -Continue Zofran  4 mg q8 PRN nausea/vomiting -Continue PRN's: Tylenol , Maalox, Atarax , Milk of Magnesia, Trazodone    --  The risks/benefits/side-effects/alternatives to medications were discussed in detail with the patient and time was given for questions. The patient consents to medication trials.                -- Metabolic profile and EKG monitoring obtained while on an atypical antipsychotic (BMI: 28.73 Lipid Panel: WNL except Trig: 180 HbgA1c: 5.1)              --  Encouraged patient to participate in unit milieu and in scheduled group therapies              -- Short Term Goals: Ability to identify changes in lifestyle to reduce recurrence of condition will improve, Ability to verbalize feelings will improve, Ability to disclose and discuss suicidal ideas, Ability to demonstrate self-control  will improve, Ability to identify and develop effective coping behaviors will improve, Ability to maintain clinical measurements within normal limits will improve, Compliance with prescribed medications will improve, and Ability to identify triggers associated with substance abuse/mental health issues will improve             -- Long Term Goals: Improvement in symptoms so as ready for discharge   Safety and Monitoring:             -- Involuntary admission to inpatient psychiatric unit for safety, stabilization and treatment             -- Daily contact with patient to assess and evaluate symptoms and progress in treatment             -- Patient's case to be discussed in multi-disciplinary team meeting             -- Observation Level : q15 minute checks             -- Vital signs:  q12 hours             -- Precautions: suicide, elopement, and assault  Discharge Planning:              -- Appreciate SW assistance with transfer to a facility that provides ECT given the patient requires a higher level of care.   Karleen Kaufmann, MD 03/23/2024, 4:20 PM
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
## 2024-03-24 DIAGNOSIS — Z8659 Personal history of other mental and behavioral disorders: Secondary | ICD-10-CM | POA: Diagnosis not present

## 2024-03-24 DIAGNOSIS — F0390 Unspecified dementia without behavioral disturbance: Secondary | ICD-10-CM

## 2024-03-24 DIAGNOSIS — F312 Bipolar disorder, current episode manic severe with psychotic features: Secondary | ICD-10-CM | POA: Diagnosis not present

## 2024-03-24 NOTE — Plan of Care (Signed)
   Problem: Education: Goal: Emotional status will improve Outcome: Progressing Goal: Mental status will improve Outcome: Progressing

## 2024-03-24 NOTE — Group Note (Deleted)
 Date:  03/24/2024 Time:  8:25 PM  Group Topic/Focus:  Wrap-Up Group:   The focus of this group is to help patients review their daily goal of treatment and discuss progress on daily workbooks.     Participation Level:  {BHH PARTICIPATION OZCZO:77735}  Participation Quality:  {BHH PARTICIPATION QUALITY:22265}  Affect:  {BHH AFFECT:22266}  Cognitive:  {BHH COGNITIVE:22267}  Insight: {BHH Insight2:20797}  Engagement in Group:  {BHH ENGAGEMENT IN HMNLE:77731}  Modes of Intervention:  {BHH MODES OF INTERVENTION:22269}  Additional Comments:  ***  Allison Bolton 03/24/2024, 8:25 PM

## 2024-03-24 NOTE — B[facility] Group Notes (Signed)
 Adult Psychoeducational Group Note  Date:  03/24/2024 Time:  9:09 PM  Group Topic/Focus:  Wrap-Up Group:   The focus of this group is to help patients review their daily goal of treatment and discuss progress on daily workbooks.  Participation Level:  Active  Participation Quality:  Appropriate  Affect:  Appropriate  Cognitive:  Appropriate  Insight: Appropriate  Engagement in Group:  Engaged  Modes of Intervention:  Discussion  Additional Comments:  Pt told that today was an okay day on the unit, the highlight of which was waking up this morning. On the subject of short term goals, Pt mentioned simply wanting to discharge home. Pt rated her day a 1 out of 10 due to wanting to discharge.  Teylor Wolven Lee 03/24/2024, 9:09 PM

## 2024-03-24 NOTE — Progress Notes (Signed)
   03/24/24 0900  Psych Admission Type (Psych Patients Only)  Admission Status Involuntary  Psychosocial Assessment  Patient Complaints None  Eye Contact Fair  Facial Expression Animated  Affect Anxious  Speech Slow  Interaction Childlike  Motor Activity Restless  Appearance/Hygiene Disheveled  Behavior Characteristics Cooperative  Mood Silly  Thought Process  Coherency Disorganized  Content Preoccupation  Delusions WDL  Perception Derealization  Hallucination None reported or observed  Judgment Poor  Confusion Mild  Danger to Self  Current suicidal ideation? Denies  Danger to Others  Danger to Others None reported or observed

## 2024-03-24 NOTE — Plan of Care (Signed)
   Problem: Education: Goal: Emotional status will improve Outcome: Progressing Goal: Mental status will improve Outcome: Progressing   Problem: Activity: Goal: Interest or engagement in activities will improve Outcome: Progressing

## 2024-03-24 NOTE — Progress Notes (Signed)
 [facility] MD Progress Note  03/24/2024 9:31 AM Allison Bolton  MRN:  [id number]  Principal Problem: Bipolar affective disorder, current episode manic with psychotic symptoms (HCC) Diagnosis: Principal Problem:   Bipolar affective disorder, current episode manic with psychotic symptoms (HCC)   Total Time spent with patient:  I personally spent 25 minutes on the unit in direct patient care. The direct patient care time included face-to-face time with the patient, reviewing the patient's chart, communicating with other professionals, and coordinating care.   Identifying Information and brief psychiatric history:  Allison Bolton is a 53 yr old female with working psychiatric diagnoses of bipolar 1 disorder (vs SAD-BT) and a history of prior documented substance-induced psychosis. She does have a history of prior psychiatric hospitalizations with substance use driving psychotic symptoms, however on this admission has presented with clear manic and psychotic symptoms that have not cleared despite time and medications. She has one prior suicide Attempt (2007), and Prior Psychiatric Hospitalizations (last- [facility] 01/2024).  On this admission she presented to [facility] ED on 02/17/24  with a chief complaint of anxiety. In the ED, she was found to be acutely psychotic with tangential speech, agitation, and delusional thought content. Patient was admitted involuntarily to [facility] on 8/14. Since admission the patient has been notably unresponsive to antipsychotic medications - remaining acutely psychotic and plan has been for transfer to facility that provides ECT for treatment of refractory mania with psychosis.    Interval events: Patient did not have any behavioral concerns or require PRNs per nursring notes. Did attend one group with limited insight and minimal participation. Medication compliant.   OT re-evaluated the patient yesterday (9*17), MOCA 8/30 with severe global cognitive impairment noted with  deficits primarily in attention. Requires prompting/redirection to do ADLs. Recommendation is 24-hr supervision and heavy environmental structuring.   Interview today:  Today the patient reports she is good. Denies hearing any voices and cannot remember the last time this happened. Feels that she is ready to go at any time and would be comfortable discharging to her aunt. Denies all other concerns. When asked about getting up, showering and eating the patient reports that she feels she can take care of this without issue. No other concerns voiced today.    Past Medical History:  Past Medical History:  Diagnosis Date   Anxiety    Arthritis    Asthma    Bipolar 1 disorder (HCC)    Diverticulosis    Dysrhythmia    sts I have heart palpitations   Fibromyalgia    H/O hiatal hernia    4   Headache(784.0)    High blood pressure    Meniere's disease    S/P colonoscopy    Dr. Golda 2010: few small diverticula at sigmoid. otherwise normal.    Shortness of breath     Past Surgical History:  Procedure Laterality Date   ABDOMINAL HYSTERECTOMY     APPENDECTOMY     CESAREAN SECTION     CHOLECYSTECTOMY     COLONOSCOPY N/A 12/25/2023   Procedure: COLONOSCOPY;  Surgeon: Eartha Angelia Sieving, MD;  Location: [facility];  Service: Gastroenterology;  Laterality: N/A;  11:30am, asa 1   exploratory laparoscopy     FOOT SURGERY     HEMORRHOID SURGERY     LAPAROSCOPIC APPENDECTOMY  02/19/2011   Procedure: APPENDECTOMY LAPAROSCOPIC;  Surgeon: Oneil DELENA Budge;  Location: [facility] ORS;  Service: General;  Laterality: N/A;   LAPAROSCOPY  02/19/2011   Procedure: LAPAROSCOPY  DIAGNOSTIC;  Surgeon: Oneil DELENA Budge;  Location: [facility] ORS;  Service: General;  Laterality: N/A;   left ovarian removal     multiple hernia repairs     Right ovarian removal     TONSILLECTOMY     TONSILLECTOMY AND ADENOIDECTOMY     tubes in ears     UMBILICAL HERNIA REPAIR  Dec 2011   Dr. Budge   wisdom teeth removal      Family History:  Family History  Problem Relation Age of Onset   Thyroid  disease Mother    Colon cancer Father    Breast cancer Maternal Aunt    Colon cancer Brother    Colon cancer Other        paternal and maternal grandfather   Meniere's disease Other    Anesthesia problems Neg Hx    Hypotension Neg Hx    Malignant hyperthermia Neg Hx    Pseudochol deficiency Neg Hx    Family Psychiatric  History:  None Reported   Social History:  Social History   Substance and Sexual Activity  Alcohol Use No   Comment: not since June 2012     Social History   Substance and Sexual Activity  Drug Use Yes   Types: Marijuana   Comment: patient denies any-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    Social History   Socioeconomic History   Marital status: Legally Separated    Spouse name: Not on file   Number of children: Not on file   Years of education: Not on file   Highest education level: Not on file  Occupational History   Not on file  Tobacco Use   Smoking status: Every Day    Current packs/day: 0.00    Average packs/day: 0.5 packs/day for 37.3 years (18.7 ttl pk-yrs)    Types: Cigarettes    Start date: 07/07/1981    Last attempt to quit: 11/05/2018    Years since quitting: 5.3   Smokeless tobacco: Never  Vaping Use   Vaping status: Never Used  Substance and Sexual Activity   Alcohol use: No    Comment: not since June 2012   Drug use: Yes    Types: Marijuana    Comment: patient denies any-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   Sexual activity: Never    Birth control/protection: Surgical  Other Topics Concern   Not on file  Social History Narrative   Not on file   Social Drivers of Health   Financial Resource Strain: Not on file  Food Insecurity: Food Insecurity Present (02/18/2024)   Hunger Vital Sign    Worried About Running Out of Food in the Last Year: Sometimes true    Ran Out of Food in the Last Year: Sometimes true  Transportation Needs: Unmet  Transportation Needs (02/18/2024)   PRAPARE - Administrator, Civil Service (Medical): Yes    Lack of Transportation (Non-Medical): Yes  Physical Activity: Not on file  Stress: Not on file  Social Connections: Unknown (10/14/2021)   Received from [facility]   Social Connections    Do your friends and family support you?: Not on file    What agencies support you?: Not on file    Current Medications: Current Facility-Administered Medications  Medication Dose Route Frequency Provider Last Rate Last Admin   acetaminophen  (TYLENOL ) tablet 650 mg  650 mg Oral Q6H PRN White, Patrice L, NP   650 mg at 03/22/24  1901   alum & mag hydroxide-simeth (MAALOX/MYLANTA) 200-200-20 MG/5ML suspension 30 mL  30 mL Oral Q4H PRN White, Patrice L, NP   30 mL at 03/24/24 0217   benztropine  (COGENTIN ) tablet 1 mg  1 mg Oral BID White, Patrice L, NP   1 mg at 03/24/24 0815   divalproex  (DEPAKOTE ) DR tablet 750 mg  750 mg Oral Q12H Ji, Andrew, MD   750 mg at 03/24/24 0815   haloperidol  (HALDOL ) tablet 10 mg  10 mg Oral Q12H Ji, Andrew, MD   10 mg at 03/24/24 9183   hydrOXYzine  (ATARAX ) tablet 25 mg  25 mg Oral TID PRN White, Patrice L, NP   25 mg at 03/23/24 2040   LORazepam  (ATIVAN ) tablet 0.5 mg  0.5 mg Oral QHS Marry Clamp, MD   0.5 mg at 03/23/24 2040   magnesium  hydroxide (MILK OF MAGNESIA) suspension 30 mL  30 mL Oral Daily PRN White, Patrice L, NP       nicotine  polacrilex (NICORETTE ) gum 2 mg  2 mg Oral PRN Trudy Carwin, NP   2 mg at 03/23/24 1324   OLANZapine  (ZYPREXA ) injection 10 mg  10 mg Intramuscular TID PRN White, Patrice L, NP       OLANZapine  (ZYPREXA ) injection 5 mg  5 mg Intramuscular TID PRN White, Patrice L, NP       OLANZapine  zydis (ZYPREXA ) disintegrating tablet 5 mg  5 mg Oral TID PRN White, Patrice L, NP   5 mg at 03/13/24 2221   ondansetron  (ZOFRAN -ODT) disintegrating tablet 4 mg  4 mg Oral Q8H PRN Lynnette Barter, MD   4 mg at 03/07/24 2205   traZODone   (DESYREL ) tablet 50 mg  50 mg Oral QHS White, Patrice L, NP   50 mg at 03/23/24 2040   ziprasidone  (GEODON ) capsule 60 mg  60 mg Oral BID WC Marry Clamp, MD   60 mg at 03/24/24 0815    Lab Results:  No results found for this or any previous visit (from the past 48 hours).   Blood Alcohol level:  Lab Results  Component Value Date   [facility] <15 02/17/2024   ETH <15 01/14/2024    Metabolic Disorder Labs: Lab Results  Component Value Date   HGBA1C 5.1 01/18/2024   MPG 99.67 01/18/2024   MPG 105.41 05/11/2019   Lab Results  Component Value Date   PROLACTIN 24.1 (H) 05/11/2019   Lab Results  Component Value Date   CHOL 154 01/18/2024   TRIG 180 (H) 01/18/2024   HDL 49 01/18/2024   CHOLHDL 3.1 01/18/2024   VLDL 36 01/18/2024   LDLCALC 69 01/18/2024   LDLCALC 82 05/11/2019    Mental Status exam: Appearance: white female of slightly elevated BMI, remains disheveled and with poor dentition, seen reclining in bed, does open her eyes today Eye contact: fair - improved  Attitude towards examiner  cooperative but with limited ability to engage  Psychomotor: no agitation or retardation Speech: reduced amount, infrequent one-word responses  Language: simplistic  Mood: good Affect: restricted, not labile  Thought content: denying SI, HI, not overtly expressing delusional content Thought Process: remains confused and concrete, not overtly disorganized  Perception: denying AVH, not overtly RTIS on limited exam Insight: poor  Judgement: poor   Orientation: to self  Attention/Concentration: limited due to psychosis  Memory/Cognition: poor - patient's most recent MOCA on 9/17 was an 8 with significant impairments in attention and memory.  Fund of Knowledge: below average  Musculoskeletal: Strength & Muscle Tone: within normal limits Gait & Station: normal Patient leans: N/A    Physical Exam Constitutional:      Appearance: the patient is not toxic-appearing.   Pulmonary:     Effort: Pulmonary effort is normal.  Neurological:     General: No focal deficit present.     Mental Status: the patient is alert and oriented to person, place, and time.   Review of Systems  Respiratory:  Negative for shortness of breath.   Cardiovascular:  Negative for chest pain.  Gastrointestinal:  Negative for abdominal pain, constipation, diarrhea, nausea and vomiting.  Neurological:  Negative for headaches.   Blood pressure 105/81, pulse 88, temperature 97.6 F (36.4 C), temperature source Oral, resp. rate 16, height 5' 6 (1.676 m), weight 80.7 kg, SpO2 99%. Body mass index is 28.71 kg/m.   Treatment Plan Summary: Daily contact with patient to assess and evaluate symptoms and progress in treatment and Medication management  Assessment SIMRIT GOHLKE is a 53 yr old female who presented on 02/17/24 to [facility] ED with a chief complaint of anxiety. In the ED, she was found to be acutely psychotic with tangential speech, agitation, and delusional thought content. Patient was admitted involuntarily to [facility] on 8/14.  Although substance-induced psychosis has previously been documented, she additional presents with prior reported manic episodes. On this admission, she has presented with agitation and increased rate of speech, initially had significantly poor sleep and psychotic symptoms, disorganization of thoughts, behavior, concern for AVH, have been significant and unresponsive to antipsychotic medications. Given clear manic episode prior to hospitalization and during first days of hospitalization, working diagnosis is a bipolar 1 disorder. SAD-BT is also high on the differential given lingering significant psychotic symptoms that lingered long after manic symptoms had resolved. Numerous antipsychotic medications and mood stabilizers have been tried, but she has remained with disorganization in thoughts and behavior, poor self care concerning for ongoing psychosis vs  underlying major neurocognitive disorder. Due in large part to poor self care and limited response to medications, the plan for some time has been transfer to a unit that can do ECT.  Over the last few days the patient has been pleasant, denying AVH, somewhat more engaged in exam and with better eye contact. She has not been seen to be RTIS or overtly expressing delusional content. Symptoms of frank mania or psychosis appear to have largely cleared, however she remains with significant cognitive impairments with recent MOCA on 9/17 scoring at 8 and recommendation for 24 hr supervision and help/prompting ADLs. At this time, cognitive deficits appear consistent with a major neurocognitive disorder. Etiology is not clear - it may be related to substance use, longstanding psychotic episodes, a primary developing neurocognitive disorder or some combination. Regardless, it is not clear that continued psychiatric hospitalization or ECT would have any further benefit. At this time we will pursue discharge and recommendation for SNF vs family if they are comfortable caring for her needs at home.     DSM-5 diagnoses: Bipolar 1 Disorder, current episode manic, severe, with psychotic features  -R/O SAD-BT given significant psychotic symptoms  History of substance induced psychotic disorder 3. Major Neurocognitive Disorder    Plan:  Psychiatric medications:  -Continue Haldol  10 mg q12 for psychosis and mood stability -Cont Geodon  60 mg bid -Continue Depakote  DR 750 mg q12 for mood stability - Continue Ativan  at reduced dose of 0.5 mg nightly -Continue Cogentin  1 mg BID for Drug Induced EPS -Continue  Agitation Protocol: Zyprexa    Nicotine  Dependence: -Continue Nicotine  Gum 2 mg PRN  Additional PRNs -Continue Zofran  4 mg q8 PRN nausea/vomiting -Continue PRN's: Tylenol , Maalox, Atarax , Milk of Magnesia, Trazodone    --  The risks/benefits/side-effects/alternatives to medications were discussed in  detail with the patient and time was given for questions. The patient consents to medication trials.                -- Metabolic profile and EKG monitoring obtained while on an atypical antipsychotic (BMI: 28.73 Lipid Panel: WNL except Trig: 180 HbgA1c: 5.1)              -- Encouraged patient to participate in unit milieu and in scheduled group therapies              -- Short Term Goals: Ability to identify changes in lifestyle to reduce recurrence of condition will improve, Ability to verbalize feelings will improve, Ability to disclose and discuss suicidal ideas, Ability to demonstrate self-control will improve, Ability to identify and develop effective coping behaviors will improve, Ability to maintain clinical measurements within normal limits will improve, Compliance with prescribed medications will improve, and Ability to identify triggers associated with substance abuse/mental health issues will improve             -- Long Term Goals: Improvement in symptoms so as ready for discharge   Safety and Monitoring:             -- Involuntary admission to inpatient psychiatric unit for safety, stabilization and treatment             -- Daily contact with patient to assess and evaluate symptoms and progress in treatment             -- Patient's case to be discussed in multi-disciplinary team meeting             -- Observation Level : q15 minute checks             -- Vital signs:  q12 hours             -- Precautions: suicide, elopement, and assault  Discharge Planning:             -appreciate SW assistance in discharging home to family vs to a SNF    Allison LOISE Arts, MD 03/24/2024, 9:31 AM

## 2024-03-24 NOTE — Progress Notes (Signed)
 Collateral contact - re:  Electroconvulsive therapy (ECT)    [facility] [address], [place], [place] [address] Phone:  [phone]  Fax: [phone]   CSW faxed updated documents.     [facility] Electroconvulsive therapy (ECT) [address] [place], [place], [place] [address] [phone], x [phone], ID # [id number], input call back number#. Fax: [phone]   They don't have any beds available.     [facility] [address] [place], [place] [address] [phone]   Ms. Katheryn Slack, ECT Coordinator, [phone] said that she received the documents CSW faxed and the doctor is reviewing it.  She said that that they don't have any beds available, and today she received phone calls from local people about bed availability.    Brittiny Levitz, LCSWA

## 2024-03-24 NOTE — Plan of Care (Incomplete)
   Problem: Education: Goal: Knowledge of [place] [facility] Education information/materials will improve Outcome: Progressing Goal: Emotional status will improve Outcome: Progressing Goal: Mental status will improve Outcome: Progressing

## 2024-03-24 NOTE — Progress Notes (Signed)
 (  Any PRNs that were needed, meds refused, or side effects to meds)- Vistaril  25 mg  (Any disturbances and when (visitation, over night)-N/A  (Concerns raised by the patient)- none  (SI/HI/AVH)-denies

## 2024-03-24 NOTE — [facility] Group Notes (Signed)
 Adult Psychoeducational Group Note  Date:  03/24/2024 Time:  4:11 PM  Group Topic/Focus:  Goals Group:   The focus of this group is to help patients establish daily goals to achieve during treatment and discuss how the patient can incorporate goal setting into their daily lives to aide in recovery. Orientation:   The focus of this group is to educate the patient on the purpose and policies of crisis stabilization and provide a format to answer questions about their admission.  The group details unit policies and expectations of patients while admitted.  Participation Level:  Did Not Attend  Participation Quality:    Affect:    Cognitive:    Insight:   Engagement in Group:    Modes of Intervention:    Additional Comments:    Margit Batte O 03/24/2024, 4:11 PM

## 2024-03-24 NOTE — Group Note (Signed)
 Recreation Therapy Group Note   Group Topic:Other  Group Date: 03/24/2024 Start Time: 1005 End Time: 1043 Facilitators: Laron Angelini-McCall, LRT,CTRS Location: [address] Dayroom   Group Topic/Focus: Self Expression   Goal Area(s) Addresses:  Patient will share their idea of self expression. Patient will be able to identify a variety of ways one can express themself. Patient will successfully share why it is beneficial to express yourself.  Behavioral Response: Minimal, Attentive  Intervention: Music  Activity:  Patients were able to select songs they felt helped them express/show their emotions. Patients could sing a long or dance to the songs as they played during group. Patients were allowed to any song as long as it was clean and appropriate.   Education: Special educational needs teacher, Investment banker, corporate Outcome: Acknowledges education, Self Expression   Affect/Mood: Flat   Participation Level: Minimal   Participation Quality: Independent   Behavior: Attentive  and Guarded   Speech/Thought Process: Barely audible    Insight: None   Judgement: None   Modes of Intervention: Music   Patient Response to Interventions:  Attentive   Education Outcome:  In group clarification offered    Clinical Observations/Individualized Feedback: Pt came in late and appeared a little reserved. Pt seemed to lighten up as group went on. Pt would smile and had a peer she seemed to be drawn to. Pt sat beside peer and listened to the music with her. Pt didn't have any songs she wanted to request but was attentive to the selections of peers.     Plan: Continue to engage patient in RT group sessions 2-3x/week.   Lj Miyamoto-McCall, LRT,CTRS 03/24/2024 11:05 AM

## 2024-03-25 DIAGNOSIS — F0390 Unspecified dementia without behavioral disturbance: Secondary | ICD-10-CM | POA: Diagnosis not present

## 2024-03-25 DIAGNOSIS — F312 Bipolar disorder, current episode manic severe with psychotic features: Secondary | ICD-10-CM | POA: Diagnosis not present

## 2024-03-25 NOTE — Progress Notes (Signed)
 [facility] MD Progress Note  03/25/2024 12:03 PM [name]  MRN:  [id number]  Principal Problem: Bipolar affective disorder, most recent episode manic with psychotic symptoms (HCC)     Major Neurocognitive Disorder  Diagnosis: Principal Problem:   Bipolar affective disorder, current episode manic with psychotic symptoms (HCC)  Major Neurocognitive Disorder    Total Time spent with patient:  I personally spent 25 minutes on the unit in direct patient care. The direct patient care time included face-to-face time with the patient, reviewing the patient's chart, communicating with other professionals, and coordinating care.   Identifying Information and brief psychiatric history:  Allison Bolton is a 53 yr old female with working psychiatric diagnoses of bipolar 1 disorder and major neurocognitive disorder. 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 She does have a history of prior psychiatric hospitalizations with substance use driving psychotic symptoms, however on this admission has presented with clear manic and psychotic symptoms that have not cleared despite time and medications. She has one prior suicide Attempt (2007), and Prior Psychiatric Hospitalizations (last- [facility] 01/2024).  On this admission she presented to [facility] ED on 02/17/24  with a chief complaint of anxiety. In the ED, she was found to be acutely psychotic with tangential speech, agitation, and delusional thought content. Patient was admitted involuntarily to [facility] on 8/14. Since admission the patient has been notably unresponsive to antipsychotic medications - remaining acutely psychotic and plan has been for transfer to facility that provides ECT for treatment of refractory mania with psychosis.    Interval events: Patient did not have any behavioral concerns or require PRNs per nursring notes. Documented to be childlike in her interactions and often disheveled. She has been  calm, cooperative with care and medication compliant.   OT re-evaluation 9/17 demonstrated MOCA 8/30 with severe global cognitive impairment noted with deficits primarily in attention. Requires prompting/redirection to do ADLs. Recommendation is 24-hr supervision and heavy environmental structuring.     Interview today:  Today the patient reports she is good. She denies SI, HI and AVH. She does not remember what she did in group yesterday. She does not voice additional concerns and does not volunteer any other information.    Past Medical History:  Past Medical History:  Diagnosis Date   Anxiety    Arthritis    Asthma    Bipolar 1 disorder (HCC)    Diverticulosis    Dysrhythmia    sts I have heart palpitations   Fibromyalgia    H/O hiatal hernia    4   Headache(784.0)    High blood pressure    Meniere's disease    S/P colonoscopy    Dr. Golda 2010: few small diverticula at sigmoid. otherwise normal.    Shortness of breath     Past Surgical History:  Procedure Laterality Date   ABDOMINAL HYSTERECTOMY     APPENDECTOMY     CESAREAN SECTION     CHOLECYSTECTOMY     COLONOSCOPY N/A 12/25/2023   Procedure: COLONOSCOPY;  Surgeon: Eartha Angelia Sieving, MD;  Location: [facility] ENDO [facility];  Service: Gastroenterology;  Laterality: N/A;  11:30am, asa 1   exploratory laparoscopy     FOOT SURGERY     HEMORRHOID SURGERY     LAPAROSCOPIC APPENDECTOMY  02/19/2011   Procedure: APPENDECTOMY LAPAROSCOPIC;  Surgeon: Oneil DELENA Budge;  Location: [facility] ORS;  Service: General;  Laterality: N/A;   LAPAROSCOPY  02/19/2011   Procedure: LAPAROSCOPY DIAGNOSTIC;  Surgeon: Oneil DELENA  Mavis;  Location: [facility] ORS;  Service: General;  Laterality: N/A;   left ovarian removal     multiple hernia repairs     Right ovarian removal     TONSILLECTOMY     TONSILLECTOMY AND ADENOIDECTOMY     tubes in ears     UMBILICAL HERNIA REPAIR  Dec 2011   Dr. Mavis   wisdom teeth removal     Family History:  Family  History  Problem Relation Age of Onset   Thyroid  disease Mother    Colon cancer Father    Breast cancer Maternal Aunt    Colon cancer Brother    Colon cancer Other        paternal and maternal grandfather   Meniere's disease Other    Anesthesia problems Neg Hx    Hypotension Neg Hx    Malignant hyperthermia Neg Hx    Pseudochol deficiency Neg Hx    Family Psychiatric  History:  None Reported   Social History:  Social History   Substance and Sexual Activity  Alcohol Use No   Comment: not since June 2012     Social History   Substance and Sexual Activity  Drug Use Yes   Types: Marijuana   Comment: patient denies any-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    Social History   Socioeconomic History   Marital status: Legally Separated    Spouse name: Not on file   Number of children: Not on file   Years of education: Not on file   Highest education level: Not on file  Occupational History   Not on file  Tobacco Use   Smoking status: Every Day    Current packs/day: 0.00    Average packs/day: 0.5 packs/day for 37.3 years (18.7 ttl pk-yrs)    Types: Cigarettes    Start date: 07/07/1981    Last attempt to quit: 11/05/2018    Years since quitting: 5.3   Smokeless tobacco: Never  Vaping Use   Vaping status: Never Used  Substance and Sexual Activity   Alcohol use: No    Comment: not since June 2012   Drug use: Yes    Types: Marijuana    Comment: patient denies any-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   Sexual activity: Never    Birth control/protection: Surgical  Other Topics Concern   Not on file  Social History Narrative   Not on file   Social Drivers of Health   Financial Resource Strain: Not on file  Food Insecurity: Food Insecurity Present (02/18/2024)   Hunger Vital Sign    Worried About Running Out of Food in the Last Year: Sometimes true    Ran Out of Food in the Last Year: Sometimes true  Transportation Needs: Unmet Transportation Needs  (02/18/2024)   PRAPARE - Administrator, Civil Service (Medical): Yes    Lack of Transportation (Non-Medical): Yes  Physical Activity: Not on file  Stress: Not on file  Social Connections: Unknown (10/14/2021)   Received from [facility]   Social Connections    Do your friends and family support you?: Not on file    What agencies support you?: Not on file    Current Medications: Current Facility-Administered Medications  Medication Dose Route Frequency Provider Last Rate Last Admin   acetaminophen  (TYLENOL ) tablet 650 mg  650 mg Oral Q6H PRN White, Patrice L, NP   650 mg at 03/22/24 1901   alum &  mag hydroxide-simeth (MAALOX/MYLANTA) 200-200-20 MG/5ML suspension 30 mL  30 mL Oral Q4H PRN White, Patrice L, NP   30 mL at 03/24/24 0217   benztropine  (COGENTIN ) tablet 1 mg  1 mg Oral BID White, Patrice L, NP   1 mg at 03/25/24 0748   divalproex  (DEPAKOTE ) DR tablet 750 mg  750 mg Oral Q12H Ji, Andrew, MD   750 mg at 03/25/24 9251   haloperidol  (HALDOL ) tablet 10 mg  10 mg Oral Q12H Ji, Andrew, MD   10 mg at 03/25/24 9251   hydrOXYzine  (ATARAX ) tablet 25 mg  25 mg Oral TID PRN White, Patrice L, NP   25 mg at 03/23/24 2040   LORazepam  (ATIVAN ) tablet 0.5 mg  0.5 mg Oral QHS Marry Clamp, MD   0.5 mg at 03/24/24 2100   magnesium  hydroxide (MILK OF MAGNESIA) suspension 30 mL  30 mL Oral Daily PRN White, Patrice L, NP       nicotine  polacrilex (NICORETTE ) gum 2 mg  2 mg Oral PRN Trudy Carwin, NP   2 mg at 03/23/24 1324   OLANZapine  (ZYPREXA ) injection 10 mg  10 mg Intramuscular TID PRN White, Patrice L, NP       OLANZapine  (ZYPREXA ) injection 5 mg  5 mg Intramuscular TID PRN White, Patrice L, NP       OLANZapine  zydis (ZYPREXA ) disintegrating tablet 5 mg  5 mg Oral TID PRN White, Patrice L, NP   5 mg at 03/13/24 2221   ondansetron  (ZOFRAN -ODT) disintegrating tablet 4 mg  4 mg Oral Q8H PRN Lynnette Barter, MD   4 mg at 03/07/24 2205   traZODone  (DESYREL ) tablet 50 mg  50  mg Oral QHS White, Patrice L, NP   50 mg at 03/24/24 2100   ziprasidone  (GEODON ) capsule 60 mg  60 mg Oral BID WC Marry Clamp, MD   60 mg at 03/25/24 9251    Lab Results:  No results found for this or any previous visit (from the past 48 hours).   Blood Alcohol level:  Lab Results  Component Value Date   [facility] <15 02/17/2024   ETH <15 01/14/2024    Metabolic Disorder Labs: Lab Results  Component Value Date   HGBA1C 5.1 01/18/2024   MPG 99.67 01/18/2024   MPG 105.41 05/11/2019   Lab Results  Component Value Date   PROLACTIN 24.1 (H) 05/11/2019   Lab Results  Component Value Date   CHOL 154 01/18/2024   TRIG 180 (H) 01/18/2024   HDL 49 01/18/2024   CHOLHDL 3.1 01/18/2024   VLDL 36 01/18/2024   LDLCALC 69 01/18/2024   LDLCALC 82 05/11/2019    Mental Status exam: Appearance: white female of slightly elevated BMI, remains disheveled and with poor dentition, seen reclining in bed, Eye contact: good - overall improved  Attitude towards examiner  cooperative but with limited ability to engage  Psychomotor: no agitation or retardation Speech: reduced amount, infrequent one-word responses  Language: simplistic  Mood: good Affect: restricted, vacant at times  Thought content: denying SI, HI, not overtly expressing delusional content Thought Process: remains confused and concrete Perception: denying AVH, not overtly RTIS on limited exam Insight: poor  Judgement: poor   Orientation: to self  Attention/Concentration: limited due to psychosis  Memory/Cognition: poor - patient's most recent MOCA on 9/17 was an 8 with significant impairments in attention and memory.  Fund of Knowledge: below average     Musculoskeletal: Strength & Muscle Tone: within normal limits Gait & Station: normal  Patient leans: N/A    Physical Exam Constitutional:      Appearance: the patient is not toxic-appearing.  Pulmonary:     Effort: Pulmonary effort is normal.  Neurological:      General: No focal deficit present.     Mental Status: the patient is alert and oriented to person, place, and time.   Review of Systems  Respiratory:  Negative for shortness of breath.   Cardiovascular:  Negative for chest pain.  Gastrointestinal:  Negative for abdominal pain, constipation, diarrhea, nausea and vomiting.  Neurological:  Negative for headaches.   Blood pressure 105/81, pulse 88, temperature 97.6 F (36.4 C), temperature source Oral, resp. rate 16, height 5' 6 (1.676 m), weight 80.7 kg, SpO2 99%. Body mass index is 28.71 kg/m.   Treatment Plan Summary: Daily contact with patient to assess and evaluate symptoms and progress in treatment and Medication management  Assessment ARANTXA PIERCEY is a 53 yr old female who presented on 02/17/24 to [facility] ED with a chief complaint of anxiety. In the ED, she was found to be acutely psychotic with tangential speech, agitation, and delusional thought content. Patient was admitted involuntarily to [facility] on 8/14.  Although substance-induced psychosis has previously been documented, she additional presents with prior reported manic episodes. On this admission, she has presented with agitation and increased rate of speech, initially had significantly poor sleep and psychotic symptoms, disorganization of thoughts, behavior, concern for AVH, have been significant and unresponsive to antipsychotic medications. Given clear manic episode prior to hospitalization and during first days of hospitalization, working diagnosis is a bipolar 1 disorder. SAD-BT is also high on the differential given lingering significant psychotic symptoms that lingered long after manic symptoms had resolved. Numerous antipsychotic medications and mood stabilizers have been tried, but she has remained with disorganization in thoughts and behavior, poor self care concerning for ongoing psychosis vs underlying major neurocognitive disorder. Due in large part to poor self care  and limited response to medications, the plan for some time has been transfer to a unit that can do ECT.  Over the last 5+ days the patient has been pleasant, denying AVH, somewhat more engaged in exam and with better eye contact. She has not been seen to be RTIS or overtly expressing delusional content. Symptoms of frank mania or psychosis appear to have largely cleared, however she remains with significant cognitive impairments with recent MOCA on 9/17 scoring at 8 and recommendation for 24 hr supervision and help/prompting ADLs. She has been noted to be childlike in her interactions and requires direction to eat and shower but is otherwise pleasant and cooperative with care. At this time, manic symptoms appear to be resolved and underlying cognitive deficits are more likely contributing to ongoing poor self-care, attention/concentration and memory. Her MOCA of 8 + findings on MSE and consistent with a major neurocognitive disorder. Etiology is not clear - it may be related to substance use, longstanding psychotic episodes, a primary developing neurocognitive disorder or some combination. Regardless, it is not clear that continued psychiatric hospitalization or ECT would have any further benefit. At this time we will pursue discharge and recommendation for SNF vs group home.      DSM-5 diagnoses: Bipolar 1 Disorder, most recent episode manic, severe, with psychotic features    -currently resolved: no active symptoms of mania or psychosis, just cognitive deficits 2.  Major Neurocognitive Disorder  -likely multifactorial 3. History of substance induced psychotic disorder   Plan:  Psychiatric medications:  -  Continue Haldol  10 mg q12 for psychosis and mood stability -Cont Geodon  60 mg bid -Continue Depakote  DR 750 mg q12 for mood stability - Continue Ativan  at reduced dose of 0.5 mg nightly -Continue Cogentin  1 mg BID for Drug Induced EPS -Continue Agitation Protocol: Zyprexa    Nicotine   Dependence: -Continue Nicotine  Gum 2 mg PRN  Additional PRNs -Continue Zofran  4 mg q8 PRN nausea/vomiting -Continue PRN's: Tylenol , Maalox, Atarax , Milk of Magnesia, Trazodone    --  The risks/benefits/side-effects/alternatives to medications were discussed in detail with the patient and time was given for questions. The patient consents to medication trials.                -- Metabolic profile and EKG monitoring obtained while on an atypical antipsychotic (BMI: 28.73 Lipid Panel: WNL except Trig: 180 HbgA1c: 5.1)              -- Encouraged patient to participate in unit milieu and in scheduled group therapies              -- Short Term Goals: Ability to identify changes in lifestyle to reduce recurrence of condition will improve, Ability to verbalize feelings will improve, Ability to disclose and discuss suicidal ideas, Ability to demonstrate self-control will improve, Ability to identify and develop effective coping behaviors will improve, Ability to maintain clinical measurements within normal limits will improve, Compliance with prescribed medications will improve, and Ability to identify triggers associated with substance abuse/mental health issues will improve             -- Long Term Goals: Improvement in symptoms so as ready for discharge   Safety and Monitoring:             -- Involuntary admission to inpatient psychiatric unit for safety, stabilization and treatment             -- Daily contact with patient to assess and evaluate symptoms and progress in treatment             -- Patient's case to be discussed in multi-disciplinary team meeting             -- Observation Level : q15 minute checks             -- Vital signs:  q12 hours             -- Precautions: suicide, elopement, and assault  Discharge Planning:             -appreciate SW assistance in discharging home to family vs to a SNF    Leita LOISE Arts, MD 03/25/2024, 12:03 PM

## 2024-03-25 NOTE — BHH Group Notes (Signed)
 Adult Psychoeducational Group Note  Date:  03/25/2024 Time:  7:38 PM  Group Topic/Focus:  Goals Group:   The focus of this group is to help patients establish daily goals to achieve during treatment and discuss how the patient can incorporate goal setting into their daily lives to aide in recovery. Orientation:   The focus of this group is to educate the patient on the purpose and policies of crisis stabilization and provide a format to answer questions about their admission.  The group details unit policies and expectations of patients while admitted.  Participation Level:  Did Not Attend  Participation Quality:    Affect:    Cognitive:    Insight:   Engagement in Group:    Modes of Intervention:     03/25/2024, 7:38 PM

## 2024-03-25 NOTE — Progress Notes (Signed)
 Pt slept 8 hrs this shift, no issues noted.

## 2024-03-25 NOTE — Group Note (Signed)
 Date:  03/25/2024 Time:  8:33 PM  Group Topic/Focus:  Wrap-Up Group:   The focus of this group is to help patients review their daily goal of treatment and discuss progress on daily workbooks.    Participation Level:  Did Not Attend  De Jaworski Dacosta 03/25/2024, 8:33 PM

## 2024-03-25 NOTE — Plan of Care (Signed)
   Problem: Education: Goal: Emotional status will improve Outcome: Progressing Goal: Mental status will improve Outcome: Progressing Goal: Verbalization of understanding the information provided will improve Outcome: Progressing

## 2024-03-25 NOTE — NC FL2 (Incomplete)
 [place]  MEDICAID [id number] LEVEL OF CARE FORM     IDENTIFICATION  Patient Name: Allison Bolton Birthdate: 02-28-1971 Sex: female Admission Date (Current Location): 02/18/2024  [facility] [place] Number:      Facility and Address:         Provider Number:    Attending Physician Name and Address:  Towana Leita SAILOR, MD  Relative Name and Phone Number:       Current Level of Care:   Recommended Level of Care:   Prior Approval Number:    Date Approved/Denied:   PASRR Number:    Discharge Plan:      Current Diagnoses: Patient Active Problem List   Diagnosis Date Noted   Bipolar affective disorder, current episode manic with psychotic symptoms (HCC) 02/18/2024   Psychoactive substance-induced psychosis (HCC) 01/16/2024   Substance induced mood disorder (HCC) 01/16/2024   Bipolar disorder (HCC) 01/15/2024   Rectal bleeding 12/25/2023   IBS (irritable bowel syndrome) 11/05/2023   Elevated TSH    Bipolar I disorder, current or most recent episode manic, with psychotic features (HCC)    Delusional disorder (HCC) 05/10/2019   Chest pain 07/18/2013   Severe bipolar I disorder, current or most recent episode depressed (HCC) 05/13/2013   Smoking 05/01/2012   Generalized anxiety disorder 06/27/2011   Liver lesion 02/04/2011    Orientation RESPIRATION BLADDER Height & Weight            Weight: 80.7 kg Height:  5' 6 (167.6 cm)  BEHAVIORAL SYMPTOMS/MOOD NEUROLOGICAL BOWEL NUTRITION STATUS           AMBULATORY STATUS COMMUNICATION OF NEEDS Skin                               Personal Care Assistance Level of Assistance              Functional Limitations Info             SPECIAL CARE FACTORS FREQUENCY                       Contractures      Additional Factors Info                  Current Medications (03/25/2024):  This is the current hospital active medication list Current Facility-Administered Medications  Medication Dose Route  Frequency Provider Last Rate Last Admin   acetaminophen  (TYLENOL ) tablet 650 mg  650 mg Oral Q6H PRN White, Patrice L, NP   650 mg at 03/22/24 1901   alum & mag hydroxide-simeth (MAALOX/MYLANTA) 200-200-20 MG/5ML suspension 30 mL  30 mL Oral Q4H PRN White, Patrice L, NP   30 mL at 03/24/24 0217   benztropine  (COGENTIN ) tablet 1 mg  1 mg Oral BID White, Patrice L, NP   1 mg at 03/25/24 0748   divalproex  (DEPAKOTE ) DR tablet 750 mg  750 mg Oral Q12H Ji, Andrew, MD   750 mg at 03/25/24 0748   haloperidol  (HALDOL ) tablet 10 mg  10 mg Oral Q12H Lynnette Barter, MD   10 mg at 03/25/24 9251   hydrOXYzine  (ATARAX ) tablet 25 mg  25 mg Oral TID PRN White, Patrice L, NP   25 mg at 03/23/24 2040   LORazepam  (ATIVAN ) tablet 0.5 mg  0.5 mg Oral QHS Marry Clamp, MD   0.5 mg at 03/24/24 2100   magnesium  hydroxide (MILK OF MAGNESIA) suspension 30 mL  30 mL Oral Daily PRN White, Patrice L, NP       nicotine  polacrilex (NICORETTE ) gum 2 mg  2 mg Oral PRN Trudy Carwin, NP   2 mg at 03/23/24 1324   OLANZapine  (ZYPREXA ) injection 10 mg  10 mg Intramuscular TID PRN White, Patrice L, NP       OLANZapine  (ZYPREXA ) injection 5 mg  5 mg Intramuscular TID PRN White, Patrice L, NP       OLANZapine  zydis (ZYPREXA ) disintegrating tablet 5 mg  5 mg Oral TID PRN White, Patrice L, NP   5 mg at 03/13/24 2221   ondansetron  (ZOFRAN -ODT) disintegrating tablet 4 mg  4 mg Oral Q8H PRN Lynnette Barter, MD   4 mg at 03/07/24 2205   traZODone  (DESYREL ) tablet 50 mg  50 mg Oral QHS White, Patrice L, NP   50 mg at 03/24/24 2100   ziprasidone  (GEODON ) capsule 60 mg  60 mg Oral BID WC Marry Clamp, MD   60 mg at 03/25/24 9251     Discharge Medications: Please see discharge summary for a list of discharge medications.  Relevant Imaging Results:  Relevant Lab Results:   Additional Information    Kaydenn Mclear O Harry Bark, LCSWA

## 2024-03-25 NOTE — Progress Notes (Addendum)
 Collateral contact - [facility], [address], [place], [place] [address], [phone]  CSW made an Adult Protective Services report asking to assign a legal guardian for patient.  The decision will be made today.  CSW will receive a letter and a phone call with the decision.   Collateral contact - Laymon GORMAN Mirza [email or web address]  Laymon Mustard, [facility] Care Coordinator [facility] [phone]  cell  Communication via email:  CSW inquired if any beds were available.  CSW received a response:  Thank you for thinking of [employer].  With this patient's current medical and ADL status she sounds appropriate for ALF memory care. I currently do not have any memory care beds available at this time.   Kishon Garriga, LCSWA 03/25/2024

## 2024-03-25 NOTE — Group Note (Signed)
 Recreation Therapy Group Note   Group Topic:Coping Skills  Group Date: 03/25/2024 Start Time: 1020 End Time: 1046 Facilitators: Norris Bodley-McCall, LRT,CTRS Location: [address] Dayroom   Group Topic: Coping Skills   Goal Area(s) Addresses: Patient will define what a coping skill is. Patient will work to create a list of healthy coping skills beginning with each letter of the alphabet. Patient will successfully identify positive coping skills they can use post d/c.  Patient will acknowledge benefit(s) of using learned coping skills post d/c.  Behavioral Response:    Intervention: Group work   Activity: Coping A to Z. Patient asked to identify what a coping skill is and when they use them. Patients with Clinical research associate discussed healthy versus unhealthy coping skills. Next patients were given a blank worksheet titled Coping Skills A-Z. Partners were instructed to come up with at least one positive coping skill per letter of the alphabet. Patients were given 15 minutes to brainstorm before ideas were presented to the large group. Patients and LRT debriefed on the importance of coping skill selection based on situation and back-up plans when a skill tried is not effective. At the end of group, patients were given an handout of alphabetized strategies to keep for future reference.   Education: Pharmacologist, Scientist, physiological, Discharge Planning.    Education Outcome: Acknowledges education/Verbalizes understanding/In group clarification offered/Additional education needed   Affect/Mood: N/A   Participation Level: Did not attend    Clinical Observations/Individualized Feedback:      Plan: Continue to engage patient in RT group sessions 2-3x/week.   Allison Bolton, LRT,CTRS  03/25/2024 1:04 PM

## 2024-03-26 DIAGNOSIS — F312 Bipolar disorder, current episode manic severe with psychotic features: Secondary | ICD-10-CM | POA: Diagnosis not present

## 2024-03-26 DIAGNOSIS — F0390 Unspecified dementia without behavioral disturbance: Secondary | ICD-10-CM | POA: Diagnosis not present

## 2024-03-26 NOTE — Progress Notes (Signed)
 [facility] MD Progress Note  03/26/2024 7:55 AM Allison Bolton  MRN:  [id number]  Principal Problem: Bipolar affective disorder, most recent episode manic with psychotic symptoms (HCC)     Major Neurocognitive Disorder  Diagnosis: Principal Problem:   Bipolar affective disorder, current episode manic with psychotic symptoms (HCC)  Major Neurocognitive Disorder    Total Time spent with patient:  I personally spent 25 minutes on the unit in direct patient care. The direct patient care time included face-to-face time with the patient, reviewing the patient's chart, communicating with other professionals, and coordinating care.   Identifying Information and brief psychiatric history:  Allison Bolton is a 53 yr old female with working psychiatric diagnoses of bipolar 1 disorder and major neurocognitive disorder. 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 She does have a history of prior psychiatric hospitalizations with substance use driving psychotic symptoms, however on this admission has presented with clear manic and psychotic symptoms that have not cleared despite time and medications. She has one prior suicide Attempt (2007), and Prior Psychiatric Hospitalizations (last- [facility] 01/2024).  On this admission she presented to [facility] ED on 02/17/24  with a chief complaint of anxiety. In the ED, she was found to be acutely psychotic with tangential speech, agitation, and delusional thought content. Patient was admitted involuntarily to [facility] on 8/14. Since admission the patient has been notably unresponsive to antipsychotic medications - remaining acutely psychotic and plan has been for transfer to facility that provides ECT for treatment of refractory mania with psychosis.    Interval events:  Patient did not have any behavioral concerns or require PRNs per nursring notes. She has been calm, cooperative with care and medication compliant. Remains with  significant cognitive deficits requiring prompting to do ADLs. Per SW, [facility] has recommended ALF memory care bed.   OT re-evaluation 9/17 demonstrated MOCA 8/30 with severe global cognitive impairment noted with deficits primarily in attention. Requires prompting/redirection to do ADLs. Recommendation is 24-hr supervision and heavy environmental structuring.     Interview today:  Today the patient reports she is good! She is seen walking through the milieu today, smiling, does interrupt interview with another patient to ask if she could also leave Monday and was notified that we are working on placement for her. She smiles, states okay! And returns to her room. When discussing concerns she denies all, including SI, HI and AVH. No additional information volunteered    Past Medical History:  Past Medical History:  Diagnosis Date   Anxiety    Arthritis    Asthma    Bipolar 1 disorder (HCC)    Diverticulosis    Dysrhythmia    sts I have heart palpitations   Fibromyalgia    H/O hiatal hernia    4   Headache(784.0)    High blood pressure    Meniere's disease    S/P colonoscopy    Dr. Golda 2010: few small diverticula at sigmoid. otherwise normal.    Shortness of breath     Past Surgical History:  Procedure Laterality Date   ABDOMINAL HYSTERECTOMY     APPENDECTOMY     CESAREAN SECTION     CHOLECYSTECTOMY     COLONOSCOPY N/A 12/25/2023   Procedure: COLONOSCOPY;  Surgeon: Eartha Angelia Sieving, MD;  Location: [facility];  Service: Gastroenterology;  Laterality: N/A;  11:30am, asa 1   exploratory laparoscopy     FOOT SURGERY     HEMORRHOID SURGERY  LAPAROSCOPIC APPENDECTOMY  02/19/2011   Procedure: APPENDECTOMY LAPAROSCOPIC;  Surgeon: Oneil DELENA Budge;  Location: [facility] OR[facility];  Service: General;  Laterality: N/A;   LAPAROSCOPY  02/19/2011   Procedure: LAPAROSCOPY DIAGNOSTIC;  Surgeon: Oneil DELENA Budge;  Location: [facility] OR[facility];  Service: General;  Laterality: N/A;   left  ovarian removal     multiple hernia repairs     Right ovarian removal     TONSILLECTOMY     TONSILLECTOMY AND ADENOIDECTOMY     tubes in ears     UMBILICAL HERNIA REPAIR  Dec 2011   Dr. Budge   wisdom teeth removal     Family History:  Family History  Problem Relation Age of Onset   Thyroid  disease Mother    Colon cancer Father    Breast cancer Maternal Aunt    Colon cancer Brother    Colon cancer Other        paternal and maternal grandfather   Meniere's disease Other    Anesthesia problems Neg Hx    Hypotension Neg Hx    Malignant hyperthermia Neg Hx    Pseudochol deficiency Neg Hx    Family Psychiatric  History:  None Reported   Social History:  Social History   Substance and Sexual Activity  Alcohol Use No   Comment: not since June 2012     Social History   Substance and Sexual Activity  Drug Use Yes   Types: Marijuana   Comment: patient denies any-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    Social History   Socioeconomic History   Marital status: Legally Separated    Spouse name: Not on file   Number of children: Not on file   Years of education: Not on file   Highest education level: Not on file  Occupational History   Not on file  Tobacco Use   Smoking status: Every Day    Current packs/day: 0.00    Average packs/day: 0.5 packs/day for 37.3 years (18.7 ttl pk-yrs)    Types: Cigarettes    Start date: 07/07/1981    Last attempt to quit: 11/05/2018    Years since quitting: 5.3   Smokeless tobacco: Never  Vaping Use   Vaping status: Never Used  Substance and Sexual Activity   Alcohol use: No    Comment: not since June 2012   Drug use: Yes    Types: Marijuana    Comment: patient denies any-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   Sexual activity: Never    Birth control/protection: Surgical  Other Topics Concern   Not on file  Social History Narrative   Not on file   Social Drivers of Health   Financial Resource Strain: Not on file   Food Insecurity: Food Insecurity Present (02/18/2024)   Hunger Vital Sign    Worried About Running Out of Food in the Last Year: Sometimes true    Ran Out of Food in the Last Year: Sometimes true  Transportation Needs: Unmet Transportation Needs (02/18/2024)   PRAPARE - Administrator, Civil Service (Medical): Yes    Lack of Transportation (Non-Medical): Yes  Physical Activity: Not on file  Stress: Not on file  Social Connections: Unknown (10/14/2021)   Received from [facility]   Social Connections    Do your friends and family support you?: Not on file    What agencies support you?: Not on file    Current Medications: Current Facility-Administered Medications  Medication Dose Route Frequency Provider Last Rate Last Admin   acetaminophen  (TYLENOL ) tablet 650 mg  650 mg Oral Q6H PRN White, Patrice L, NP   650 mg at 03/22/24 1901   alum & mag hydroxide-simeth (MAALOX/MYLANTA) 200-200-20 MG/5ML suspension 30 mL  30 mL Oral Q4H PRN White, Patrice L, NP   30 mL at 03/24/24 0217   benztropine  (COGENTIN ) tablet 1 mg  1 mg Oral BID White, Patrice L, NP   1 mg at 03/25/24 1649   divalproex  (DEPAKOTE ) DR tablet 750 mg  750 mg Oral Q12H Ji, Andrew, MD   750 mg at 03/25/24 1933   haloperidol  (HALDOL ) tablet 10 mg  10 mg Oral Q12H Ji, Andrew, MD   10 mg at 03/25/24 1933   hydrOXYzine  (ATARAX ) tablet 25 mg  25 mg Oral TID PRN White, Patrice L, NP   25 mg at 03/25/24 2022   LORazepam  (ATIVAN ) tablet 0.5 mg  0.5 mg Oral QHS Marry Clamp, MD   0.5 mg at 03/25/24 2022   magnesium  hydroxide (MILK OF MAGNESIA) suspension 30 mL  30 mL Oral Daily PRN White, Patrice L, NP       nicotine  polacrilex (NICORETTE ) gum 2 mg  2 mg Oral PRN Trudy Carwin, NP   2 mg at 03/23/24 1324   OLANZapine  (ZYPREXA ) injection 10 mg  10 mg Intramuscular TID PRN White, Patrice L, NP       OLANZapine  (ZYPREXA ) injection 5 mg  5 mg Intramuscular TID PRN White, Patrice L, NP       OLANZapine  zydis  (ZYPREXA ) disintegrating tablet 5 mg  5 mg Oral TID PRN White, Patrice L, NP   5 mg at 03/13/24 2221   ondansetron  (ZOFRAN -ODT) disintegrating tablet 4 mg  4 mg Oral Q8H PRN Lynnette Barter, MD   4 mg at 03/07/24 2205   traZODone  (DESYREL ) tablet 50 mg  50 mg Oral QHS White, Patrice L, NP   50 mg at 03/25/24 2022   ziprasidone  (GEODON ) capsule 60 mg  60 mg Oral BID WC Marry Clamp, MD   60 mg at 03/25/24 1649    Lab Results:  No results found for this or any previous visit (from the past 48 hours).   Blood Alcohol level:  Lab Results  Component Value Date   [facility] <15 02/17/2024   ETH <15 01/14/2024    Metabolic Disorder Labs: Lab Results  Component Value Date   HGBA1C 5.1 01/18/2024   MPG 99.67 01/18/2024   MPG 105.41 05/11/2019   Lab Results  Component Value Date   PROLACTIN 24.1 (H) 05/11/2019   Lab Results  Component Value Date   CHOL 154 01/18/2024   TRIG 180 (H) 01/18/2024   HDL 49 01/18/2024   CHOLHDL 3.1 01/18/2024   VLDL 36 01/18/2024   LDLCALC 69 01/18/2024   LDLCALC 82 05/11/2019    Mental Status exam: Appearance: white female of slightly elevated BMI, remains disheveled and with poor dentition, seen today more alert, walking through the milieu Eye contact: good  Attitude towards examiner  cooperative and friendly today, but remains with limited ability to engage due to cognitive deficits  Psychomotor: no agitation or retardation Speech: reduced amount, infrequent one-word responses  Language: simplistic  Mood: good Affect: restricted, vacant at times  Thought content: denying SI, HI, not overtly expressing delusional content Thought Process: remains confused and concrete, childlike  Perception: denying AVH, not overtly RTIS on limited exam Insight: poor  Judgement: poor   Orientation: to self  Attention/Concentration: limited due to psychosis  Memory/Cognition: poor - patient's most recent MOCA on 9/17 was an 8 with significant impairments in attention  and memory.  Fund of Knowledge: below average     Musculoskeletal: Strength & Muscle Tone: within normal limits Gait & Station: normal Patient leans: N/A    Physical Exam Constitutional:      Appearance: the patient is not toxic-appearing.  Pulmonary:     Effort: Pulmonary effort is normal.  Neurological:     General: No focal deficit present.     Mental Status: the patient is alert and oriented to person, place, and time.   Review of Systems  Respiratory:  Negative for shortness of breath.   Cardiovascular:  Negative for chest pain.  Gastrointestinal:  Negative for abdominal pain, constipation, diarrhea, nausea and vomiting.  Neurological:  Negative for headaches.   Blood pressure 113/76, pulse (!) 102, temperature 97.7 F (36.5 C), temperature source Oral, resp. rate 16, height 5' 6 (1.676 m), weight 80.7 kg, SpO2 95%. Body mass index is 28.71 kg/m.   Treatment Plan Summary: Daily contact with patient to assess and evaluate symptoms and progress in treatment and Medication management  Assessment Allison Bolton is a 53 yr old female who presented on 02/17/24 to [facility] ED with a chief complaint of anxiety. In the ED, she was found to be acutely psychotic with tangential speech, agitation, and delusional thought content. Patient was admitted involuntarily to [facility] on 8/14.  Although substance-induced psychosis has previously been documented, she additional presents with prior reported manic episodes. On this admission, she has presented with agitation and increased rate of speech, initially had significantly poor sleep and psychotic symptoms, disorganization of thoughts, behavior, concern for AVH, have been significant and unresponsive to antipsychotic medications. Given clear manic episode prior to hospitalization and during first days of hospitalization, working diagnosis is a bipolar 1 disorder. SAD-BT is also high on the differential given lingering significant psychotic  symptoms that lingered long after manic symptoms had resolved. Numerous antipsychotic medications and mood stabilizers have been tried, but she has remained with disorganization in thoughts and behavior, poor self care concerning for ongoing psychosis vs underlying major neurocognitive disorder. Due in large part to poor self care and limited response to medications, the plan for some time has been transfer to a unit that can do ECT.  Over the last 5+ days the patient has been pleasant, denying AVH, somewhat more engaged in exam and with better eye contact. She has not been seen to be RTIS or overtly expressing delusional content. Symptoms of frank mania or psychosis appear to have largely cleared, however she remains with significant cognitive impairments with recent MOCA on 9/17 scoring at 8 and recommendation for 24 hr supervision and help/prompting ADLs. She has been noted to be childlike in her interactions and requires direction to eat and shower but is otherwise pleasant and cooperative with care. At this time, manic symptoms appear to be resolved and underlying cognitive deficits are more likely contributing to ongoing poor self-care, attention/concentration and memory. Her MOCA of 8 + findings on MSE and consistent with a major neurocognitive disorder. Etiology is not clear - it may be related to substance use, longstanding psychotic episodes, a primary developing neurocognitive disorder or some combination. Regardless, it is not clear that continued psychiatric hospitalization or ECT would have any further benefit. At this time we will pursue discharge and recommendation for SNF vs group home.    Today was seen up  walking around, ambulating steadily, smiling and friendly. Was able to engage in exam to limited extent due to cognitive deficits. No concerns voiced. No objective signs of clear mania or psychosis aside from ongoing significant cognitive impairments as noted above. Will continue with  medications and work towards SNF vs ALF memory home.   DSM-5 diagnoses: Bipolar 1 Disorder, most recent episode manic, severe, with psychotic features    -currently resolved: no active symptoms of mania or psychosis, just cognitive deficits 2.  Major Neurocognitive Disorder  -likely multifactorial 3. History of substance induced psychotic disorder   Plan:  Psychiatric medications:  -Continue Haldol  10 mg q12 for psychosis and mood stability -Cont Geodon  60 mg bid -Continue Depakote  DR 750 mg q12 for mood stability - Continue Ativan  at reduced dose of 0.5 mg nightly -Continue Cogentin  1 mg BID for Drug Induced EPS -Continue Agitation Protocol: Zyprexa    Nicotine  Dependence: -Continue Nicotine  Gum 2 mg PRN  Additional PRNs -Continue Zofran  4 mg q8 PRN nausea/vomiting -Continue PRN's: Tylenol , Maalox, Atarax , Milk of Magnesia, Trazodone    --  The risks/benefits/side-effects/alternatives to medications were discussed in detail with the patient and time was given for questions. The patient consents to medication trials.                -- Metabolic profile and EKG monitoring obtained while on an atypical antipsychotic (BMI: 28.73 Lipid Panel: WNL except Trig: 180 HbgA1c: 5.1)              -- Encouraged patient to participate in unit milieu and in scheduled group therapies              -- Short Term Goals: Ability to identify changes in lifestyle to reduce recurrence of condition will improve, Ability to verbalize feelings will improve, Ability to disclose and discuss suicidal ideas, Ability to demonstrate self-control will improve, Ability to identify and develop effective coping behaviors will improve, Ability to maintain clinical measurements within normal limits will improve, Compliance with prescribed medications will improve, and Ability to identify triggers associated with substance abuse/mental health issues will improve             -- Long Term Goals: Improvement in symptoms so as  ready for discharge   Safety and Monitoring:             -- Involuntary admission to inpatient psychiatric unit for safety, stabilization and treatment             -- Daily contact with patient to assess and evaluate symptoms and progress in treatment             -- Patient's case to be discussed in multi-disciplinary team meeting             -- Observation Level : q15 minute checks             -- Vital signs:  q12 hours             -- Precautions: suicide, elopement, and assault  Discharge Planning:             -appreciate SW assistance in discharging home to a SNF vs ALF memory home   Leita LOISE Arts, MD 03/26/2024, 7:55 AM

## 2024-03-26 NOTE — Group Note (Signed)
 LCSW Group Therapy Note  Group Date: 03/26/2024 Start Time: 1045 End Time: 1145   Type of Therapy and Topic:  Group Therapy: Using I Statements  Participation Level:  Did Not Attend  Hunter JONELLE Lever, LCSW 03/26/2024  5:29 PM

## 2024-03-26 NOTE — B[facility] Group Notes (Signed)
 Adult Psychoeducational Group Note  Date:  03/26/2024 Time:  7:47 PM  Group Topic/Focus:  Goals Group:   The focus of this group is to help patients establish daily goals to achieve during treatment and discuss how the patient can incorporate goal setting into their daily lives to aide in recovery. Orientation:   The focus of this group is to educate the patient on the purpose and policies of crisis stabilization and provide a format to answer questions about their admission.  The group details unit policies and expectations of patients while admitted.  Participation Level:  Did Not Attend  Participation Quality:    Affect:    Cognitive:    Insight:   Engagement in Group:    Modes of Intervention:    Additional Comments:    Allison Bolton 03/26/2024, 7:47 PM

## 2024-03-26 NOTE — Progress Notes (Signed)
(  Sleep Hours) - 7.75 hours (Any PRNs that were needed, meds refused, or side effects to meds)-atarax  (Any disturbances and when (visitation, over night) (Concerns raised by the patient)- none (SI/HI/AVH)-denies

## 2024-03-26 NOTE — Group Note (Signed)
 Date:  03/26/2024 Time:  8:34 PM  Group Topic/Focus:  Wrap-Up Group:   The focus of this group is to help patients review their daily goal of treatment and discuss progress on daily workbooks.    Participation Level:  Did Not Attend  Alysiah Suppa Dacosta 03/26/2024, 8:34 PM

## 2024-03-26 NOTE — Plan of Care (Signed)
  Problem: Self-Care: Goal: Ability to participate in self-care as condition permits will improve Outcome: Progressing   Problem: Education: Goal: Emotional status will improve Outcome: Not Progressing Goal: Mental status will improve Outcome: Not Progressing

## 2024-03-26 NOTE — Progress Notes (Signed)
 Tour of Duty:  [name], RN, 03/26/24, Tour of Duty: 0700-1900  SI/HI/AVH: Denies  Self-Reported   Mood: Positive  Anxiety: Denies Depression: Denies Irritability: Denies  Broset  Violence Prevention Guidelines *See Row Information*: Small Violence Risk interventions implemented   LBM  Last BM Date : 03/26/24   Pain: not present  Patient Refusals (including Rx): No  Shift Summary: Patient observed to be calm but isolative to self on unit. Patient hair appears cleaner, though remains matted. Per report, patient was able to successfully shower last night. Per report, odor noted to perineal area, LP made aware UA may be necessary. Patient able to make needs known. Patient observed to engage inappropriately with staff and peers due to minimal or no initation. Patient taking medications as prescribed. This shift, no PRN medication requested or required. No observed or reported side effects to medication. No observed or reported agitation, aggression, or other acute emotional distress. No observed or reported physical abnormalities or concerns.   Last Vitals  Vitals Weight: 80.7 kg Temp: 97.7 F (36.5 C) Temp Source: Oral Pulse Rate: (!) 102 Resp: 16 BP: 113/76 Patient Position: (not recorded)  Admission Type  Psych Admission Type (Psych Patients Only) Admission Status: Involuntary Date 72 hour document signed : (not recorded) Time 72 hour document signed : (not recorded) Provider Notified (First and Last Name) (see details for LINK to note): (not recorded)   Psychosocial Assessment  Psychosocial Assessment Patient Complaints: None Eye Contact: Fair Facial Expression: Animated Affect: Preoccupied Speech: Slow Interaction: Childlike, Evasive Motor Activity: Shuffling, Restless Appearance/Hygiene: Disheveled Behavior Characteristics: Cooperative Mood: Preoccupied   Aggressive Behavior  Targets: (not recorded)   Thought Process  Thought  Process Coherency: Disorganized Content: Preoccupation Delusions: None reported or observed Perception: Derealization Hallucination: None reported or observed Judgment: Impaired Confusion: Mild  Danger to Self/Others  Danger to Self Current suicidal ideation?: Denies Description of Suicide Plan: (not recorded) Self-Injurious Behavior: (not recorded) Agreement Not to Harm Self: (not recorded) Description of Agreement: (not recorded) Danger to Others: None reported or observed

## 2024-03-26 NOTE — Progress Notes (Signed)
   03/26/24 2200  Psych Admission Type (Psych Patients Only)  Admission Status Involuntary  Psychosocial Assessment  Patient Complaints None  Eye Contact Fair  Facial Expression Animated  Affect Preoccupied  Speech Slow  Interaction Childlike;Assertive  Motor Activity Restless  Appearance/Hygiene Improved  Behavior Characteristics Cooperative;Appropriate to situation  Mood Preoccupied  Thought Process  Coherency Disorganized  Content Preoccupation  Delusions None reported or observed  Perception Derealization  Hallucination None reported or observed  Judgment Impaired  Confusion Mild  Danger to Self  Current suicidal ideation? Denies

## 2024-03-26 NOTE — Plan of Care (Signed)
   Problem: Activity: Goal: Interest or engagement in activities will improve Outcome: Progressing Goal: Sleeping patterns will improve Outcome: Progressing

## 2024-03-26 NOTE — Plan of Care (Signed)
  Problem: Education: Goal: Knowledge of [facility] Education information/materials will improve Outcome: Progressing Goal: Emotional status will improve Outcome: Progressing Goal: Mental status will improve Outcome: Progressing Goal: Verbalization of understanding the information provided will improve Outcome: Progressing   Problem: Activity: Goal: Interest or engagement in activities will improve Outcome: Progressing Goal: Sleeping patterns will improve Outcome: Progressing   Problem: Coping: Goal: Ability to verbalize frustrations and anger appropriately will improve Outcome: Progressing Goal: Ability to demonstrate self-control will improve Outcome: Progressing   Problem: Health Behavior/Discharge Planning: Goal: Identification of resources available to assist in meeting health care needs will improve Outcome: Progressing Goal: Compliance with treatment plan for underlying cause of condition will improve Outcome: Progressing   Problem: Physical Regulation: Goal: Ability to maintain clinical measurements within normal limits will improve Outcome: Progressing   Problem: Safety: Goal: Periods of time without injury will increase Outcome: Progressing   Problem: Education: Goal: Knowledge of [place] [facility] Education information/materials will improve Outcome: Progressing Goal: Emotional status will improve Outcome: Progressing Goal: Mental status will improve Outcome: Progressing Goal: Verbalization of understanding the information provided will improve Outcome: Progressing   Problem: Activity: Goal: Interest or engagement in activities will improve Outcome: Progressing Goal: Sleeping patterns will improve Outcome: Progressing   Problem: Coping: Goal: Ability to verbalize frustrations and anger appropriately will improve Outcome: Progressing Goal: Ability to demonstrate self-control will improve Outcome: Progressing   Problem: Health  Behavior/Discharge Planning: Goal: Identification of resources available to assist in meeting health care needs will improve Outcome: Progressing Goal: Compliance with treatment plan for underlying cause of condition will improve Outcome: Progressing   Problem: Physical Regulation: Goal: Ability to maintain clinical measurements within normal limits will improve Outcome: Progressing   Problem: Safety: Goal: Periods of time without injury will increase Outcome: Progressing   Problem: Activity: Goal: Will identify at least one activity in which they can participate Outcome: Progressing   Problem: Coping: Goal: Ability to identify and develop effective coping behavior will improve Outcome: Progressing Goal: Ability to interact with others will improve Outcome: Progressing Goal: Demonstration of participation in decision-making regarding own care will improve Outcome: Progressing Goal: Ability to use eye contact when communicating with others will improve Outcome: Progressing   Problem: Health Behavior/Discharge Planning: Goal: Identification of resources available to assist in meeting health care needs will improve Outcome: Progressing   Problem: Self-Concept: Goal: Will verbalize positive feelings about self Outcome: Progressing   Problem: Activity: Goal: Will verbalize the importance of balancing activity with adequate rest periods Outcome: Progressing   Problem: Education: Goal: Will be free of psychotic symptoms Outcome: Progressing Goal: Knowledge of the prescribed therapeutic regimen will improve Outcome: Progressing   Problem: Coping: Goal: Coping ability will improve Outcome: Progressing Goal: Will verbalize feelings Outcome: Progressing   Problem: Health Behavior/Discharge Planning: Goal: Compliance with prescribed medication regimen will improve Outcome: Progressing   Problem: Nutritional: Goal: Ability to achieve adequate nutritional intake will  improve Outcome: Progressing   Problem: Role Relationship: Goal: Ability to communicate needs accurately will improve Outcome: Progressing Goal: Ability to interact with others will improve Outcome: Progressing   Problem: Safety: Goal: Ability to redirect hostility and anger into socially appropriate behaviors will improve Outcome: Progressing Goal: Ability to remain free from injury will improve Outcome: Progressing   Problem: Self-Care: Goal: Ability to participate in self-care as condition permits will improve Outcome: Progressing   Problem: Self-Concept: Goal: Will verbalize positive feelings about self Outcome: Progressing

## 2024-03-27 DIAGNOSIS — F0390 Unspecified dementia without behavioral disturbance: Secondary | ICD-10-CM | POA: Diagnosis not present

## 2024-03-27 DIAGNOSIS — F312 Bipolar disorder, current episode manic severe with psychotic features: Secondary | ICD-10-CM | POA: Diagnosis not present

## 2024-03-27 NOTE — Group Note (Unsigned)
 Date:  03/27/2024 Time:  8:51 PM  Group Topic/Focus:  Wrap-Up Group:   The focus of this group is to help patients review their daily goal of treatment and discuss progress on daily workbooks.     Participation Level:  {BHH PARTICIPATION OZCZO:77735}  Participation Quality:  {BHH PARTICIPATION QUALITY:22265}  Affect:  {BHH AFFECT:22266}  Cognitive:  {BHH COGNITIVE:22267}  Insight: {BHH Insight2:20797}  Engagement in Group:  {BHH ENGAGEMENT IN HMNLE:77731}  Modes of Intervention:  {BHH MODES OF INTERVENTION:22269}  Additional Comments:  ***  Stephane Niemann A Prisha Hiley 03/27/2024, 8:51 PM

## 2024-03-27 NOTE — Group Note (Signed)
 Date:  03/27/2024 Time:  8:37 PM  Group Topic/Focus:  Wrap-Up Group:   The focus of this group is to help patients review their daily goal of treatment and discuss progress on daily workbooks.    Participation Level:  Active  Participation Quality:  Appropriate  Affect:  Appropriate  Cognitive:  Appropriate  Insight: Appropriate  Engagement in Group:  Engaged  Modes of Intervention:  Education and Exploration  Additional Comments:  Patient attended and participated in group tonight. She reports that she likes every thing about herself.  She would like to improve her walk.  Allison Bolton 03/27/2024, 8:37 PM

## 2024-03-27 NOTE — Progress Notes (Signed)
(  Sleep Hours) -3.75 (Any PRNs that were needed, meds refused, or side effects to meds)- none (Any disturbances and when (visitation, over night)-none (Concerns raised by the patient)- none (SI/HI/AVH)-denies all

## 2024-03-27 NOTE — Progress Notes (Signed)
 [facility] MD Progress Note  03/27/2024 11:38 AM Allison Bolton  MRN:  [id number]  Principal Problem: Bipolar affective disorder, most recent episode manic with psychotic symptoms (HCC)     Major Neurocognitive Disorder  Diagnosis: Principal Problem:   Bipolar affective disorder, current episode manic with psychotic symptoms (HCC)  Major Neurocognitive Disorder    Total Time spent with patient:  I personally spent 25 minutes on the unit in direct patient care. The direct patient care time included face-to-face time with the patient, reviewing the patient's chart, communicating with other professionals, and coordinating care.   Identifying Information and brief psychiatric history:  Allison Bolton is a 53 yr old female with working psychiatric diagnoses of bipolar 1 disorder and major neurocognitive disorder. 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 She does have a history of prior psychiatric hospitalizations with substance use driving psychotic symptoms, however on this admission has presented with clear manic and psychotic symptoms that have not cleared despite time and medications. She has one prior suicide Attempt (2007), and Prior Psychiatric Hospitalizations (last- [facility] 01/2024).  On this admission she presented to [facility] ED on 02/17/24  with a chief complaint of anxiety. In the ED, she was found to be acutely psychotic with tangential speech, agitation, and delusional thought content. Patient was admitted involuntarily to [facility] on 8/14. Since admission the patient has been notably unresponsive to antipsychotic medications - remaining acutely psychotic and plan has been for transfer to facility that provides ECT for treatment of refractory mania with psychosis.    Interval events:  Patient did not have any behavioral concerns or require PRNs per nursring notes. She has been calm, cooperative with care and medication compliant. Remains with  significant cognitive deficits requiring prompting to do ADLs. No PRNs for behaviors reported    OT re-evaluation 9/17 demonstrated [facility] 8/30 with severe global cognitive impairment noted with deficits primarily in attention. Requires prompting/redirection to do ADLs. Recommendation is 24-hr supervision and heavy environmental structuring.    Interview today:  Today the patient reports she is good. Does not volunteer additional information and largely keeps eyes closed today. Denies having any questions or concerns. Denies SI, HI or AVH.   Past Medical History:  Past Medical History:  Diagnosis Date   Anxiety    Arthritis    Asthma    Bipolar 1 disorder (HCC)    Diverticulosis    Dysrhythmia    sts I have heart palpitations   Fibromyalgia    H/O hiatal hernia    4   Headache(784.0)    High blood pressure    Meniere's disease    S/P colonoscopy    Dr. Golda 2010: few small diverticula at sigmoid. otherwise normal.    Shortness of breath     Past Surgical History:  Procedure Laterality Date   ABDOMINAL HYSTERECTOMY     APPENDECTOMY     CESAREAN SECTION     CHOLECYSTECTOMY     COLONOSCOPY N/A 12/25/2023   Procedure: COLONOSCOPY;  Surgeon: Eartha Angelia Sieving, MD;  Location: [facility];  Service: Gastroenterology;  Laterality: N/A;  11:30am, asa 1   exploratory laparoscopy     FOOT SURGERY     HEMORRHOID SURGERY     LAPAROSCOPIC APPENDECTOMY  02/19/2011   Procedure: APPENDECTOMY LAPAROSCOPIC;  Surgeon: Oneil DELENA Budge;  Location: [facility] ORS;  Service: General;  Laterality: N/A;   LAPAROSCOPY  02/19/2011   Procedure: LAPAROSCOPY DIAGNOSTIC;  Surgeon: Oneil DELENA Budge;  Location: [facility] ORS;  Service: General;  Laterality: N/A;   left ovarian removal     multiple hernia repairs     Right ovarian removal     TONSILLECTOMY     TONSILLECTOMY AND ADENOIDECTOMY     tubes in ears     UMBILICAL HERNIA REPAIR  Dec 2011   Dr. Mavis   wisdom teeth removal     Family History:   Family History  Problem Relation Age of Onset   Thyroid  disease Mother    Colon cancer Father    Breast cancer Maternal Aunt    Colon cancer Brother    Colon cancer Other        paternal and maternal grandfather   Meniere's disease Other    Anesthesia problems Neg Hx    Hypotension Neg Hx    Malignant hyperthermia Neg Hx    Pseudochol deficiency Neg Hx    Family Psychiatric  History:  None Reported   Social History:  Social History   Substance and Sexual Activity  Alcohol Use No   Comment: not since June 2012     Social History   Substance and Sexual Activity  Drug Use Yes   Types: Marijuana   Comment: patient denies any-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    Social History   Socioeconomic History   Marital status: Legally Separated    Spouse name: Not on file   Number of children: Not on file   Years of education: Not on file   Highest education level: Not on file  Occupational History   Not on file  Tobacco Use   Smoking status: Every Day    Current packs/day: 0.00    Average packs/day: 0.5 packs/day for 37.3 years (18.7 ttl pk-yrs)    Types: Cigarettes    Start date: 07/07/1981    Last attempt to quit: 11/05/2018    Years since quitting: 5.3   Smokeless tobacco: Never  Vaping Use   Vaping status: Never Used  Substance and Sexual Activity   Alcohol use: No    Comment: not since June 2012   Drug use: Yes    Types: Marijuana    Comment: patient denies any-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   Sexual activity: Never    Birth control/protection: Surgical  Other Topics Concern   Not on file  Social History Narrative   Not on file   Social Drivers of Health   Financial Resource Strain: Not on file  Food Insecurity: Food Insecurity Present (02/18/2024)   Hunger Vital Sign    Worried About Running Out of Food in the Last Year: Sometimes true    Ran Out of Food in the Last Year: Sometimes true  Transportation Needs: Unmet Transportation Needs  (02/18/2024)   PRAPARE - Administrator, Civil Service (Medical): Yes    Lack of Transportation (Non-Medical): Yes  Physical Activity: Not on file  Stress: Not on file  Social Connections: Unknown (10/14/2021)   Received from [facility]   Social Connections    Do your friends and family support you?: Not on file    What agencies support you?: Not on file    Current Medications: Current Facility-Administered Medications  Medication Dose Route Frequency Provider Last Rate Last Admin   acetaminophen  (TYLENOL ) tablet 650 mg  650 mg Oral Q6H PRN White, Patrice L, NP   650 mg at 03/22/24 1901   alum & mag hydroxide-simeth (  MAALOX/MYLANTA) 200-200-20 MG/5ML suspension 30 mL  30 mL Oral Q4H PRN White, Patrice L, NP   30 mL at 03/24/24 0217   benztropine  (COGENTIN ) tablet 1 mg  1 mg Oral BID White, Patrice L, NP   1 mg at 03/27/24 0816   divalproex  (DEPAKOTE ) DR tablet 750 mg  750 mg Oral Q12H Ji, Andrew, MD   750 mg at 03/27/24 0816   haloperidol  (HALDOL ) tablet 10 mg  10 mg Oral Q12H Ji, Andrew, MD   10 mg at 03/27/24 9183   hydrOXYzine  (ATARAX ) tablet 25 mg  25 mg Oral TID PRN White, Patrice L, NP   25 mg at 03/25/24 2022   LORazepam  (ATIVAN ) tablet 0.5 mg  0.5 mg Oral QHS Marry Clamp, MD   0.5 mg at 03/26/24 2047   magnesium  hydroxide (MILK OF MAGNESIA) suspension 30 mL  30 mL Oral Daily PRN White, Patrice L, NP       nicotine  polacrilex (NICORETTE ) gum 2 mg  2 mg Oral PRN Trudy Carwin, NP   2 mg at 03/23/24 1324   OLANZapine  (ZYPREXA ) injection 10 mg  10 mg Intramuscular TID PRN White, Patrice L, NP       OLANZapine  (ZYPREXA ) injection 5 mg  5 mg Intramuscular TID PRN White, Patrice L, NP       OLANZapine  zydis (ZYPREXA ) disintegrating tablet 5 mg  5 mg Oral TID PRN White, Patrice L, NP   5 mg at 03/13/24 2221   ondansetron  (ZOFRAN -ODT) disintegrating tablet 4 mg  4 mg Oral Q8H PRN Lynnette Barter, MD   4 mg at 03/26/24 1801   traZODone  (DESYREL ) tablet 50 mg  50  mg Oral QHS White, Patrice L, NP   50 mg at 03/26/24 2047   ziprasidone  (GEODON ) capsule 60 mg  60 mg Oral BID WC Marry Clamp, MD   60 mg at 03/27/24 9183    Lab Results:  No results found for this or any previous visit (from the past 48 hours).   Blood Alcohol level:  Lab Results  Component Value Date   [facility] <15 02/17/2024   ETH <15 01/14/2024    Metabolic Disorder Labs: Lab Results  Component Value Date   HGBA1C 5.1 01/18/2024   MPG 99.67 01/18/2024   MPG 105.41 05/11/2019   Lab Results  Component Value Date   PROLACTIN 24.1 (H) 05/11/2019   Lab Results  Component Value Date   CHOL 154 01/18/2024   TRIG 180 (H) 01/18/2024   HDL 49 01/18/2024   CHOLHDL 3.1 01/18/2024   VLDL 36 01/18/2024   LDLCALC 69 01/18/2024   LDLCALC 82 05/11/2019    Mental Status exam: Appearance: white female of slightly elevated BMI, remains disheveled and with poor dentition, seen today more alert, walking through the milieu Eye contact: good  Attitude towards examiner  cooperative and friendly today, but remains with limited ability to engage due to cognitive deficits  Psychomotor: no agitation or retardation Speech: reduced amount, infrequent one-word responses  Language: simplistic  Mood: good Affect: restricted, vacant at times  Thought content: denying SI, HI, not overtly expressing delusional content Thought Process: remains confused and concrete, childlike  Perception: denying AVH, not overtly RTIS on limited exam Insight: poor  Judgement: poor   Orientation: to self  Attention/Concentration: limited due to psychosis  Memory/Cognition: poor - patient's most recent MOCA on 9/17 was an 8 with significant impairments in attention and memory.  Fund of Knowledge: below average     Musculoskeletal: Strength &  Muscle Tone: within normal limits Gait & Station: normal Patient leans: N/A    Physical Exam Constitutional:      Appearance: the patient is not toxic-appearing.   Pulmonary:     Effort: Pulmonary effort is normal.  Neurological:     General: No focal deficit present.     Mental Status: the patient is alert and oriented to person, place, and time.   Review of Systems  Respiratory:  Negative for shortness of breath.   Cardiovascular:  Negative for chest pain.  Gastrointestinal:  Negative for abdominal pain, constipation, diarrhea, nausea and vomiting.  Neurological:  Negative for headaches.   Blood pressure (!) 121/97, pulse (!) 118, temperature 97.8 F (36.6 C), temperature source Oral, resp. rate 16, height 5' 6 (1.676 m), weight 80.7 kg, SpO2 97%. Body mass index is 28.71 kg/m.   Treatment Plan Summary: Daily contact with patient to assess and evaluate symptoms and progress in treatment and Medication management  Assessment RAYLAN TROIANI is a 53 yr old female who presented on 02/17/24 to [facility] ED with a chief complaint of anxiety. In the ED, she was found to be acutely psychotic with tangential speech, agitation, and delusional thought content. Patient was admitted involuntarily to [facility] on 8/14.  Although substance-induced psychosis has previously been documented, she additional presents with prior reported manic episodes. On this admission, she has presented with agitation and increased rate of speech, initially had significantly poor sleep and psychotic symptoms, disorganization of thoughts, behavior, concern for AVH, have been significant and unresponsive to antipsychotic medications. Given clear manic episode prior to hospitalization and during first days of hospitalization, working diagnosis is a bipolar 1 disorder. SAD-BT is also high on the differential given lingering significant psychotic symptoms that lingered long after manic symptoms had resolved. Numerous antipsychotic medications and mood stabilizers have been tried, but she has remained with disorganization in thoughts and behavior, poor self care concerning for ongoing  psychosis vs underlying major neurocognitive disorder. Due in large part to poor self care and limited response to medications, the plan for some time has been transfer to a unit that can do ECT.  Over the last 5+ days the patient has been pleasant, denying AVH, somewhat more engaged in exam and with better eye contact. She has not been seen to be RTIS or overtly expressing delusional content. Symptoms of frank mania or psychosis appear to have largely cleared, however she remains with significant cognitive impairments with recent MOCA on 9/17 scoring at 8 and recommendation for 24 hr supervision and help/prompting ADLs. She has been noted to be childlike in her interactions and requires direction to eat and shower but is otherwise pleasant and cooperative with care. At this time, manic symptoms appear to be resolved and underlying cognitive deficits are more likely contributing to ongoing poor self-care, attention/concentration and memory. Her MOCA of 8 + findings on MSE and consistent with a major neurocognitive disorder. Etiology is not clear - it may be related to substance use, longstanding psychotic episodes, a primary developing neurocognitive disorder or some combination. Regardless, it is not clear that continued psychiatric hospitalization or ECT would have any further benefit. At this time we will pursue discharge and recommendation for SNF vs group home.     DSM-5 diagnoses: Bipolar 1 Disorder, most recent episode manic, severe, with psychotic features    -currently resolved: no active symptoms of mania or psychosis, just cognitive deficits 2.  Major Neurocognitive Disorder  -likely multifactorial 3. History of substance  induced psychotic disorder   Plan:  Psychiatric medications:  -Continue Haldol  10 mg q12 for psychosis and mood stability -Cont Geodon  60 mg bid -Continue Depakote  DR 750 mg q12 for mood stability - Continue Ativan  at reduced dose of 0.5 mg nightly -Continue Cogentin   1 mg BID for Drug Induced EPS -Continue Agitation Protocol: Zyprexa    Nicotine  Dependence: -Continue Nicotine  Gum 2 mg PRN  Additional PRNs -Continue Zofran  4 mg q8 PRN nausea/vomiting -Continue PRN's: Tylenol , Maalox, Atarax , Milk of Magnesia, Trazodone    --  The risks/benefits/side-effects/alternatives to medications were discussed in detail with the patient and time was given for questions. The patient consents to medication trials.                -- Metabolic profile and EKG monitoring obtained while on an atypical antipsychotic (BMI: 28.73 Lipid Panel: WNL except Trig: 180 HbgA1c: 5.1)              -- Encouraged patient to participate in unit milieu and in scheduled group therapies              -- Short Term Goals: Ability to identify changes in lifestyle to reduce recurrence of condition will improve, Ability to verbalize feelings will improve, Ability to disclose and discuss suicidal ideas, Ability to demonstrate self-control will improve, Ability to identify and develop effective coping behaviors will improve, Ability to maintain clinical measurements within normal limits will improve, Compliance with prescribed medications will improve, and Ability to identify triggers associated with substance abuse/mental health issues will improve             -- Long Term Goals: Improvement in symptoms so as ready for discharge   Safety and Monitoring:             -- Involuntary admission to inpatient psychiatric unit for safety, stabilization and treatment             -- Daily contact with patient to assess and evaluate symptoms and progress in treatment             -- Patient's case to be discussed in multi-disciplinary team meeting             -- Observation Level : q15 minute checks             -- Vital signs:  q12 hours             -- Precautions: suicide, elopement, and assault  Discharge Planning:             -appreciate SW assistance in discharging home to a SNF vs ALF memory  home   Leita LOISE Arts, MD 03/27/2024, 11:38 AM

## 2024-03-27 NOTE — Progress Notes (Signed)
   03/26/24 2200  Psych Admission Type (Psych Patients Only)  Admission Status Involuntary  Psychosocial Assessment  Patient Complaints None  Eye Contact Fair  Facial Expression Animated  Affect Preoccupied  Speech Slow  Interaction Childlike;Assertive  Motor Activity Restless  Appearance/Hygiene Improved  Behavior Characteristics Cooperative;Appropriate to situation  Mood Preoccupied  Thought Process  Coherency Disorganized  Content Preoccupation  Delusions None reported or observed  Perception Derealization  Hallucination None reported or observed  Judgment Impaired  Confusion Mild  Danger to Self  Current suicidal ideation? Denies

## 2024-03-27 NOTE — Progress Notes (Signed)
   03/27/24 0816  Psych Admission Type (Psych Patients Only)  Admission Status Involuntary  Psychosocial Assessment  Patient Complaints None  Eye Contact Brief  Facial Expression Animated  Affect Preoccupied  Speech Slow  Interaction Childlike  Motor Activity Slow  Appearance/Hygiene Improved  Behavior Characteristics Cooperative  Mood Preoccupied  Thought Process  Coherency Disorganized  Content Preoccupation  Delusions None reported or observed  Perception Derealization  Hallucination None reported or observed  Judgment Impaired  Confusion Mild  Danger to Self  Current suicidal ideation? Denies  Agreement Not to Harm Self Yes  Description of Agreement verbal  Danger to Others  Danger to Others None reported or observed

## 2024-03-27 NOTE — Group Note (Unsigned)
 Date:  03/27/2024 Time:  8:30 PM  Group Topic/Focus:  Wrap-Up Group:   The focus of this group is to help patients review their daily goal of treatment and discuss progress on daily workbooks.     Participation Level:  {BHH PARTICIPATION OZCZO:77735}  Participation Quality:  {BHH PARTICIPATION QUALITY:22265}  Affect:  {BHH AFFECT:22266}  Cognitive:  {BHH COGNITIVE:22267}  Insight: {BHH Insight2:20797}  Engagement in Group:  {BHH ENGAGEMENT IN HMNLE:77731}  Modes of Intervention:  {BHH MODES OF INTERVENTION:22269}  Additional Comments:  ***  Gwenn Nobie Brooklyn 03/27/2024, 8:30 PM

## 2024-03-27 NOTE — Progress Notes (Signed)
   03/27/24 2115  Psych Admission Type (Psych Patients Only)  Admission Status Involuntary  Psychosocial Assessment  Patient Complaints None  Eye Contact Fair  Facial Expression Animated  Affect Preoccupied  Speech Slow  Interaction Childlike;Assertive  Motor Activity Restless  Appearance/Hygiene Improved  Behavior Characteristics Cooperative;Appropriate to situation  Mood Preoccupied;Pleasant  Thought Process  Coherency Disorganized  Content Preoccupation  Delusions None reported or observed  Perception Derealization  Hallucination None reported or observed  Judgment Impaired  Confusion Mild  Danger to Self  Current suicidal ideation? Denies

## 2024-03-28 ENCOUNTER — Encounter (HOSPITAL_COMMUNITY): Payer: Self-pay

## 2024-03-28 DIAGNOSIS — F0390 Unspecified dementia without behavioral disturbance: Secondary | ICD-10-CM | POA: Diagnosis not present

## 2024-03-28 DIAGNOSIS — F312 Bipolar disorder, current episode manic severe with psychotic features: Secondary | ICD-10-CM | POA: Diagnosis not present

## 2024-03-28 DIAGNOSIS — F0280 Dementia in other diseases classified elsewhere without behavioral disturbance: Secondary | ICD-10-CM | POA: Diagnosis present

## 2024-03-28 NOTE — Group Note (Signed)
 Recreation Therapy Group Note   Group Topic:Personal Development  Group Date: 03/28/2024 Start Time: 1016 End Time: 1035 Facilitators: Demeshia Sherburne-McCall, LRT,CTRS Location: [address] Dayroom   Group Topic: Personal Development  Goal Area(s) Addresses:  Patient will successfully define what peace to them. Patient will successfully identify benefit of finding peace. Patient will successfully identify how peace impacts their lives.    Behavioral Response:   Intervention: Worksheet  Activity: LRT and patients discussed what peace is. Patients were then given a worksheet were they had to identify how peace and it's impact has affected their lives. Patients were to address things such as change, relationships, goals, etc.   Education: Geophysicist/field seismologist  Education Outcome: Acknowledge education, In group clarification offered, Needs additional education   Affect/Mood: N/A   Participation Level: Did not attend    Clinical Observations/Individualized Feedback:      Plan: Continue to engage patient in RT group sessions 2-3x/week.   Alondria Mousseau-McCall, LRT,CTRS 03/28/2024 12:18 PM

## 2024-03-28 NOTE — Plan of Care (Signed)
   Problem: Activity: Goal: Interest or engagement in activities will improve Outcome: Progressing

## 2024-03-28 NOTE — NC FL2 (Signed)
 [place]  MEDICAID [id number] LEVEL OF CARE FORM     IDENTIFICATION  Patient Name: Allison Bolton Birthdate: 1971/07/07 Sex: female Admission Date (Current Location): 02/18/2024  [place] and [place] Number:  [id number]     Facility and Address:   [facility]  [address]   [place], [place] [address] Provider Number:    Attending Physician Name and Address:  Towana Leita SAILOR, MD  Relative Name and Phone Number:   Winton Sheller (aunt) [phone]     Current Level of Care:  Hospital Recommended Level of Care: Group Home / Family Home Memory Care Assisted Living Facility (AFL) Skilled Nursing Facility (SNF) Prior Approval Number:    Date Approved/Denied:   PASRR Number:    Discharge Plan:      Current Diagnoses: Patient Active Problem List   Diagnosis Date Noted   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) 03/28/2024   Bipolar affective disorder, current episode manic with psychotic symptoms (HCC) 02/18/2024   Psychoactive substance-induced psychosis (HCC) 01/16/2024   Substance induced mood disorder (HCC) 01/16/2024   Rectal bleeding 12/25/2023   IBS (irritable bowel syndrome) 11/05/2023   Elevated TSH    Bipolar I disorder, current or most recent episode manic, with psychotic features (HCC)    Chest pain 07/18/2013   Smoking 05/01/2012   Generalized anxiety disorder 06/27/2011   Liver lesion 02/04/2011    Orientation RESPIRATION BLADDER Height & Weight   Alert and oriented to person and place.    Normal  Continent Weight: 80.7 kg Height:  5' 6 (167.6 cm)  BEHAVIORAL SYMPTOMS/MOOD NEUROLOGICAL BOWEL NUTRITION STATUS  Patient is pleasant and engages appropriately with staff.  Patient remains calm and composed, even when presented with challenging information.   Patient demonstrates cognitive impairment related to neurognitive impairments (patient scored 8 on the MoCA). Diagnosis of Major neurocognitive disorder due to multiple  etiologies   Continent  Normal  AMBULATORY STATUS COMMUNICATION OF NEEDS Skin    Patient is able to walk independently without assistance.  Patient communicates verbally.  Her speech is concrete.  Normal                       Personal Care Assistance Level of Assistance   Patient is able to shower independently, however, she sometimes requires reminders to initiate bathing and to dress appropriately afterwards.  Patient is able to eat independently.           Functional Limitations Info   Patient needs supervision to manage Adult Living Skills (ALS), such as housing, shopping, medication management, meal preparation, housekeeping and scheduling appointments.          SPECIAL CARE FACTORS FREQUENCY   Patient requires 24-hour supervision.                      Contractures N/A     Additional Factors Info                  Current Medications (03/28/2024):  This is the current hospital active medication list Current Facility-Administered Medications  Medication Dose Route Frequency Provider Last Rate Last Admin   acetaminophen  (TYLENOL ) tablet 650 mg  650 mg Oral Q6H PRN White, Patrice L, NP   650 mg at 03/22/24 1901   alum & mag hydroxide-simeth (MAALOX/MYLANTA) 200-200-20 MG/5ML suspension 30 mL  30 mL Oral Q4H PRN White, Patrice L, NP   30 mL at 03/24/24 0217   benztropine  (COGENTIN ) tablet  1 mg  1 mg Oral BID White, Patrice L, NP   1 mg at 03/28/24 9191   divalproex  (DEPAKOTE ) DR tablet 750 mg  750 mg Oral Q12H Ji, Andrew, MD   750 mg at 03/28/24 9192   haloperidol  (HALDOL ) tablet 10 mg  10 mg Oral Q12H Ji, Andrew, MD   10 mg at 03/28/24 9191   hydrOXYzine  (ATARAX ) tablet 25 mg  25 mg Oral TID PRN White, Patrice L, NP   25 mg at 03/25/24 2022   LORazepam  (ATIVAN ) tablet 0.5 mg  0.5 mg Oral QHS Marry Clamp, MD   0.5 mg at 03/27/24 2219   magnesium  hydroxide (MILK OF MAGNESIA) suspension 30 mL  30 mL Oral Daily PRN White, Patrice L, NP       nicotine  polacrilex  (NICORETTE ) gum 2 mg  2 mg Oral PRN Trudy Carwin, NP   2 mg at 03/23/24 1324   OLANZapine  (ZYPREXA ) injection 10 mg  10 mg Intramuscular TID PRN White, Patrice L, NP       OLANZapine  (ZYPREXA ) injection 5 mg  5 mg Intramuscular TID PRN White, Patrice L, NP       OLANZapine  zydis (ZYPREXA ) disintegrating tablet 5 mg  5 mg Oral TID PRN White, Patrice L, NP   5 mg at 03/13/24 2221   ondansetron  (ZOFRAN -ODT) disintegrating tablet 4 mg  4 mg Oral Q8H PRN Lynnette Barter, MD   4 mg at 03/26/24 1801   traZODone  (DESYREL ) tablet 50 mg  50 mg Oral QHS White, Patrice L, NP   50 mg at 03/27/24 2043   ziprasidone  (GEODON ) capsule 60 mg  60 mg Oral BID WC Marry Clamp, MD   60 mg at 03/28/24 9192     Discharge Medications: Please see discharge summary for a list of discharge medications.  Relevant Imaging Results:  Relevant Lab Results:   Additional Information Allergies:  Amoxicillin (Reaction: Patient states she feels sunburnt when taking amoxicillin, Dermatitis, Rash and Other), Tramadol (Reaction:  Dermatitis and Rash), Amoxicillin-Pot Clavulanate (Reaction: Sunburn-like painful rash, Dermatitis). Code:  Full  Zorawar Strollo O Hilary Milks, LCSWA

## 2024-03-28 NOTE — Group Note (Signed)
 Date:  03/28/2024 Time:  8:25 PM  Group Topic/Focus:  Wrap-Up Group:   The focus of this group is to help patients review their daily goal of treatment and discuss progress on daily workbooks.    Participation Level:  Did Not Attend   Sanaai Doane Dacosta 03/28/2024, 8:25 PM

## 2024-03-28 NOTE — Progress Notes (Addendum)
(  Sleep Hours) -6 (Any PRNs that were needed, meds refused, or side effects to meds)- none (Any disturbances and when (visitation, over night)-none (Concerns raised by the patient)- none (SI/HI/AVH)- denies all

## 2024-03-28 NOTE — BHH Counselor (Signed)
 APS social worker Izetta Roses [phone]) met with the patient and Child psychotherapist.   The LCSWA set with the patient as the APS SW spoke with the her.  The patient presented with disorganized thoughts was not able to provide clear answers to the questions asked.   The APS SW stated that she will be returning to meet with the patient.  - will try and contact the patient family -will put in a protective order for the patient  - guardianship  The APS SW will contact the patient SW Jolanta to schedule future appointments.

## 2024-03-28 NOTE — Progress Notes (Addendum)
 [facility] MD Progress Note  03/28/2024 2:58 PM Allison Bolton  MRN:  [id number]  Principal Problem: Bipolar affective disorder, most recent episode manic with psychotic symptoms (HCC)     Major Neurocognitive Disorder due to multiple etiologies  Diagnosis: Principal Problem:   Bipolar affective disorder, current episode manic with psychotic symptoms (HCC)  Major Neurocognitive Disorder due to multiple etiologies    Total Time spent with patient:  I personally spent 25 minutes on the unit in direct patient care. The direct patient care time included face-to-face time with the patient, reviewing the patient's chart, communicating with other professionals, and coordinating care.   Identifying Information and brief psychiatric history:  Allison Bolton is a 53 yr old female with working psychiatric diagnoses of bipolar 1 disorder and major neurocognitive disorder. 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 She does have a history of prior psychiatric hospitalizations with substance use driving psychotic symptoms, however on this admission has presented with clear manic and psychotic symptoms that cleared with time and medications, however underlying neurocognitive deficits have remained. She has one prior suicide Attempt (2007), and Prior Psychiatric Hospitalizations (last- [facility] 01/2024).  On this admission she presented to [facility] ED on 02/17/24  with a chief complaint of anxiety. In the ED, she was found to be acutely psychotic with tangential speech, agitation, and delusional thought content. Patient was admitted involuntarily to [facility] on 8/14. Since admission the patient has been notably unresponsive to antipsychotic medications, largely related to poor self-care appearing disheveled and requiring direction to do ADLs. Although initially this was concerning for lingering psychotic symptoms, several OT evaluations have noted significant cognitive deficits and  poor self care is now thought to be related to major neurocognitive disorder. We had previously sought ECT, but now are seeking placement.    Interval events:  Patient did not have any behavioral concerns or require PRNs per nursring notes. She is cooperative with all elements of care, remains childlike and requires help with ADLs but is pleasant and easily redirectable on the unit. Continues to present with impaired judgement and confusion. Slept 6 hrs overnight, denying all complaints.   APS SW now involved and will be getting the patient a guardian and assisting in finding placement. Level 2 filled out today.    Interview today:  Today the patient reports she is fine. When asked if there is anything she would like to talk about today she asks like what and then says no. Denies all concerns including SI, HI and AVH. Does not volunteer additional information  Past Medical History:  Past Medical History:  Diagnosis Date   Anxiety    Arthritis    Asthma    Bipolar 1 disorder (HCC)    Diverticulosis    Dysrhythmia    sts I have heart palpitations   Fibromyalgia    H/O hiatal hernia    4   Headache(784.0)    High blood pressure    Meniere's disease    S/P colonoscopy    Dr. Golda 2010: few small diverticula at sigmoid. otherwise normal.    Shortness of breath     Past Surgical History:  Procedure Laterality Date   ABDOMINAL HYSTERECTOMY     APPENDECTOMY     CESAREAN SECTION     CHOLECYSTECTOMY     COLONOSCOPY N/A 12/25/2023   Procedure: COLONOSCOPY;  Surgeon: Eartha Angelia Sieving, MD;  Location: [facility] ENDO SUITE;  Service: Gastroenterology;  Laterality: N/A;  11:30am, asa 1  exploratory laparoscopy     FOOT SURGERY     HEMORRHOID SURGERY     LAPAROSCOPIC APPENDECTOMY  02/19/2011   Procedure: APPENDECTOMY LAPAROSCOPIC;  Surgeon: Oneil DELENA Budge;  Location: [facility] ORS;  Service: General;  Laterality: N/A;   LAPAROSCOPY  02/19/2011   Procedure: LAPAROSCOPY DIAGNOSTIC;   Surgeon: Oneil DELENA Budge;  Location: [facility] ORS;  Service: General;  Laterality: N/A;   left ovarian removal     multiple hernia repairs     Right ovarian removal     TONSILLECTOMY     TONSILLECTOMY AND ADENOIDECTOMY     tubes in ears     UMBILICAL HERNIA REPAIR  Dec 2011   Dr. Budge   wisdom teeth removal     Family History:  Family History  Problem Relation Age of Onset   Thyroid  disease Mother    Colon cancer Father    Breast cancer Maternal Aunt    Colon cancer Brother    Colon cancer Other        paternal and maternal grandfather   Meniere's disease Other    Anesthesia problems Neg Hx    Hypotension Neg Hx    Malignant hyperthermia Neg Hx    Pseudochol deficiency Neg Hx    Family Psychiatric  History:  None Reported   Social History:  Social History   Substance and Sexual Activity  Alcohol Use No   Comment: not since June 2012     Social History   Substance and Sexual Activity  Drug Use Yes   Types: Marijuana   Comment: patient denies any-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    Social History   Socioeconomic History   Marital status: Legally Separated    Spouse name: Not on file   Number of children: Not on file   Years of education: Not on file   Highest education level: Not on file  Occupational History   Not on file  Tobacco Use   Smoking status: Every Day    Current packs/day: 0.00    Average packs/day: 0.5 packs/day for 37.3 years (18.7 ttl pk-yrs)    Types: Cigarettes    Start date: 07/07/1981    Last attempt to quit: 11/05/2018    Years since quitting: 5.3   Smokeless tobacco: Never  Vaping Use   Vaping status: Never Used  Substance and Sexual Activity   Alcohol use: No    Comment: not since June 2012   Drug use: Yes    Types: Marijuana    Comment: patient denies any-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   Sexual activity: Never    Birth control/protection: Surgical  Other Topics Concern   Not on file  Social History Narrative    Not on file   Social Drivers of Health   Financial Resource Strain: Not on file  Food Insecurity: Food Insecurity Present (02/18/2024)   Hunger Vital Sign    Worried About Running Out of Food in the Last Year: Sometimes true    Ran Out of Food in the Last Year: Sometimes true  Transportation Needs: Unmet Transportation Needs (02/18/2024)   PRAPARE - Administrator, Civil Service (Medical): Yes    Lack of Transportation (Non-Medical): Yes  Physical Activity: Not on file  Stress: Not on file  Social Connections: Unknown (10/14/2021)   [facility]   Social Connections    Do your friends and family support you?: Not on file  What agencies support you?: Not on file    Current Medications: Current Facility-Administered Medications  Medication Dose Route Frequency Provider Last Rate Last Admin   acetaminophen  (TYLENOL ) tablet 650 mg  650 mg Oral Q6H PRN White, Patrice L, NP   650 mg at 03/22/24 1901   alum & mag hydroxide-simeth (MAALOX/MYLANTA) 200-200-20 MG/5ML suspension 30 mL  30 mL Oral Q4H PRN White, Patrice L, NP   30 mL at 03/24/24 0217   benztropine  (COGENTIN ) tablet 1 mg  1 mg Oral BID White, Patrice L, NP   1 mg at 03/28/24 9191   divalproex  (DEPAKOTE ) DR tablet 750 mg  750 mg Oral Q12H Ji, Andrew, MD   750 mg at 03/28/24 9192   haloperidol  (HALDOL ) tablet 10 mg  10 mg Oral Q12H Ji, Andrew, MD   10 mg at 03/28/24 9191   hydrOXYzine  (ATARAX ) tablet 25 mg  25 mg Oral TID PRN White, Patrice L, NP   25 mg at 03/25/24 2022   LORazepam  (ATIVAN ) tablet 0.5 mg  0.5 mg Oral QHS Marry Clamp, MD   0.5 mg at 03/27/24 2219   magnesium  hydroxide (MILK OF MAGNESIA) suspension 30 mL  30 mL Oral Daily PRN White, Patrice L, NP       nicotine  polacrilex (NICORETTE ) gum 2 mg  2 mg Oral PRN Trudy Carwin, NP   2 mg at 03/23/24 1324   OLANZapine  (ZYPREXA ) injection 10 mg  10 mg Intramuscular TID PRN White, Patrice L, NP       OLANZapine  (ZYPREXA )  injection 5 mg  5 mg Intramuscular TID PRN White, Patrice L, NP       OLANZapine  zydis (ZYPREXA ) disintegrating tablet 5 mg  5 mg Oral TID PRN White, Patrice L, NP   5 mg at 03/13/24 2221   ondansetron  (ZOFRAN -ODT) disintegrating tablet 4 mg  4 mg Oral Q8H PRN Lynnette Barter, MD   4 mg at 03/26/24 1801   traZODone  (DESYREL ) tablet 50 mg  50 mg Oral QHS White, Patrice L, NP   50 mg at 03/27/24 2043   ziprasidone  (GEODON ) capsule 60 mg  60 mg Oral BID WC Marry Clamp, MD   60 mg at 03/28/24 9192    Lab Results:  No results found for this or any previous visit (from the past 48 hours).   Blood Alcohol level:  Lab Results  Component Value Date   [facility] <15 02/17/2024   ETH <15 01/14/2024    Metabolic Disorder Labs: Lab Results  Component Value Date   HGBA1C 5.1 01/18/2024   MPG 99.67 01/18/2024   MPG 105.41 05/11/2019   Lab Results  Component Value Date   PROLACTIN 24.1 (H) 05/11/2019   Lab Results  Component Value Date   CHOL 154 01/18/2024   TRIG 180 (H) 01/18/2024   HDL 49 01/18/2024   CHOLHDL 3.1 01/18/2024   VLDL 36 01/18/2024   LDLCALC 69 01/18/2024   LDLCALC 82 05/11/2019    Mental Status exam: Appearance: white female of slightly elevated BMI, remains disheveled and with poor dentition, seen resting comfortably in bed  Eye contact: good  Attitude towards examiner  cooperative, but remains with limited ability to engage due to cognitive deficits  Psychomotor: no agitation or retardation Speech: reduced amount, infrequent one-word responses  Language: simplistic  Mood: good Affect: restricted, vacant at times  Thought content: denying SI, HI, not overtly expressing delusional content Thought Process: remains confused and concrete, childlike  Perception: denying AVH, not overtly RTIS on limited  exam Insight: poor  Judgement: poor   Orientation: to self  Attention/Concentration: limited due to psychosis  Memory/Cognition: poor - patient's most recent MOCA on  9/17 was an 8 with significant impairments in attention and memory.  Fund of Knowledge: below average     Musculoskeletal: Strength & Muscle Tone: within normal limits Gait & Station: normal Patient leans: N/A    Physical Exam Constitutional:      Appearance: the patient is not toxic-appearing.  Pulmonary:     Effort: Pulmonary effort is normal.  Neurological:     General: No focal deficit present.     Oriented to self and hospital only, does not verbalize date or situation HEENT:     Eyes EOMI   Review of Systems  Respiratory:  Negative for shortness of breath.   Cardiovascular:  Negative for chest pain.  Gastrointestinal:  Negative for abdominal pain, constipation, diarrhea, nausea and vomiting.  Neurological:  Negative for headaches.   Blood pressure 102/63, pulse 78, temperature 97.8 F (36.6 C), temperature source Oral, resp. rate 16, height 5' 6 (1.676 m), weight 80.7 kg, SpO2 100%. Body mass index is 28.71 kg/m.   Treatment Plan Summary: Daily contact with patient to assess and evaluate symptoms and progress in treatment and Medication management  Assessment Allison Bolton is a 53 yr old female who presented on 02/17/24 to [facility] ED with a chief complaint of anxiety. In the ED, she was found to be acutely psychotic with tangential speech, agitation, and delusional thought content. Patient was admitted involuntarily to [facility] on 8/14.  Although substance-induced psychosis has previously been documented, she additional presents with prior reported manic episodes and a bipolar 1 diagnosis has been most reflective of this. On this admission, presented with agitation and increased rate of speech, initially had significantly poor sleep and psychotic symptoms, disorganization of thoughts, behavior, concern for AVH.  After approximately 1 week manic symptoms had largely resolved with antipsychotic medications and mood stabilizers however she remained with disorganization  in thoughts and behavior and poor self care initially concerning for ongoing psychosis but after extended observation appears more in line with underlying major neurocognitive disorder.   Over the last 7+ days the patient has been pleasant, denying AVH, somewhat more engaged in exam and with better eye contact. She has not been seen to be RTIS or overtly expressing delusional content. Symptoms of frank mania or psychosis appear to have cleared, however she remains with significant cognitive impairments with recent MOCA on 9/17 scoring at 8 and recommendation for 24 hr supervision and help/prompting ADLs. She has been noted to be childlike in her interactions and requires direction to eat and shower but is otherwise pleasant and cooperative with care. At this time, manic symptoms appear to be resolved and underlying cognitive deficits are more likely contributing to ongoing poor self-care, attention/concentration and memory. Her MOCA of 8 + findings on MSE and consistent with a major neurocognitive disorder due to multiple etiologies. Suspect multifactorial causes of cognitive impairment including longstanding substance use, recurrent manic/psychotic episodes, possible underlying low functioning (unknown) vs possible primary neurocognitive disorder. Continued psychiatric hospitalization or ECT are unlikely to have any further benefit on cognitive symtpoms. At this time we will pursue discharge and recommendation for SNF vs group home.    APS SW now involved and will be getting the patient a guardian and assisting in finding placement. Level 2 filled out today.    DSM-5 diagnoses: Bipolar 1 Disorder, most recent episode manic, severe,  with psychotic features    -currently resolved: no active symptoms of mania or psychosis, just cognitive deficits 2.  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 History of substance induced psychotic disorder   Plan:  Psychiatric medications:  -Continue  Haldol  10 mg q12 for psychosis and mood stability -Cont Geodon  60 mg bid -Continue Depakote  DR 750 mg q12 for mood stability - Continue Ativan  at reduced dose of 0.5 mg nightly -Continue Cogentin  1 mg BID for Drug Induced EPS -Continue Agitation Protocol: Zyprexa    Nicotine  Dependence: -Continue Nicotine  Gum 2 mg PRN  Additional PRNs -Continue Zofran  4 mg q8 PRN nausea/vomiting -Continue PRN's: Tylenol , Maalox, Atarax , Milk of Magnesia, Trazodone    --  The risks/benefits/side-effects/alternatives to medications were discussed in detail with the patient and time was given for questions. The patient consents to medication trials.                -- Metabolic profile and EKG monitoring obtained while on an atypical antipsychotic (BMI: 28.73 Lipid Panel: WNL except Trig: 180 HbgA1c: 5.1)              -- Encouraged patient to participate in unit milieu and in scheduled group therapies              -- Short Term Goals: Ability to identify changes in lifestyle to reduce recurrence of condition will improve, Ability to verbalize feelings will improve, Ability to disclose and discuss suicidal ideas, Ability to demonstrate self-control will improve, Ability to identify and develop effective coping behaviors will improve, Ability to maintain clinical measurements within normal limits will improve, Compliance with prescribed medications will improve, and Ability to identify triggers associated with substance abuse/mental health issues will improve             -- Long Term Goals: Improvement in symptoms so as ready for discharge   Safety and Monitoring:             -- Involuntary admission to inpatient psychiatric unit for safety, stabilization and treatment             -- Daily contact with patient to assess and evaluate symptoms and progress in treatment             -- Patient's case to be discussed in multi-disciplinary team meeting             -- Observation Level : q15 minute checks              -- Vital signs:  q12 hours             -- Precautions: suicide, elopement, and assault  Discharge Planning:             -appreciate SW assistance in discharging  APS SW now involved and will be getting the patient a guardian and assisting in finding placement. Level 2 filled out today.    Leita LOISE Arts, MD 03/28/2024, 2:58 PM

## 2024-03-28 NOTE — Group Note (Signed)
 LCSW Group Therapy Note   Group Date: 03/28/2024 Start Time: 1300 End Time: 1400   Participation:  did not attend  Type of Therapy and Topic:  Group Therapy  Topic:  Finding Balance: Using Wise Mind for Thoughtful Decisions  Objective:  To help participants understand and apply the concept of Delsie Mind to make balanced, thoughtful decisions by integrating emotion and logic.  Goals: Learn the differences between Emotional Mind, Reasonable Mind, and [employer]. Recognize personal signs of Emotional and Reasonable Mind. Practice using [employer] in real-life scenarios.  Therapeutic Modalities: Elements of Dialectical Behavior Therapy (DBT):  Mindfulness (noticing thoughts and emotions without judgment), Emotion Regulation (understanding and managing emotional responses), Distress Tolerance (coping with difficult situations without making them worse), Wise Mind (integrating emotion and reason for balanced decision-making) Elements of Cognitive Behavioral Therapy (CBT):  Identifying automatic thoughts, Challenging cognitive distortions, Using logic to reframe unhelpful thinking patterns  Summary:  This class focused on Wise Mind--DBT's concept of balancing Emotional Mind and Reasonable Mind. We identified when we're in each state and practiced using Wise Mind to respond thoughtfully in real-life situations. By combining emotion and logic, participants can improve decision-making, manage challenges, and enhance relationships.   Hailley Byers O Khrystina Bonnes, LCSWA 03/28/2024  4:58 PM
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
## 2024-03-28 NOTE — Progress Notes (Signed)
 Collateral contact - Izetta Roses ([employer] Adult Protective Services Social Worker) [phone]    Izetta said that during the meeting, Safiyah mentioned her other daughter, Ileana who lives in [place], [place] .  There is no further information available about her.   Brogan England, LCSWA 03/28/2024

## 2024-03-28 NOTE — [facility] Suicide Risk Assessment (Signed)
 Collateral contact - Winton Sheller (aunt) [phone]  Aunt said that patient receives about $900 in disability benefits.  Aunt said patient needs help to manage her money.  Aunt said that patient's daughter lives in [place], and patient could possibly go there upon discharge, [name] [phone] (CSW will ask patient to sign the ROI).   Kynley Metzger, LCSWA 03/28/2024

## 2024-03-29 DIAGNOSIS — F312 Bipolar disorder, current episode manic severe with psychotic features: Secondary | ICD-10-CM | POA: Diagnosis not present

## 2024-03-29 NOTE — BH Assessment (Signed)
(  Sleep Hours) - 9.5 (Any PRNs that were needed, meds refused, or side effects to meds)-  (Any disturbances and when (visitation, over night)- None (Concerns raised by the patient)- None (SI/HI/AVH)- Denies

## 2024-03-29 NOTE — Progress Notes (Signed)
 Pt completed ADLs today. Pt required multiple prompts to shower, wash, rinse, dry, brush teeth, etc.

## 2024-03-29 NOTE — Plan of Care (Signed)
  Problem: Education: Goal: Knowledge of [facility] Education information/materials will improve Outcome: Progressing Goal: Emotional status will improve Outcome: Progressing Goal: Mental status will improve Outcome: Progressing Goal: Verbalization of understanding the information provided will improve Outcome: Progressing   Problem: Activity: Goal: Interest or engagement in activities will improve Outcome: Progressing Goal: Sleeping patterns will improve Outcome: Progressing   Problem: Coping: Goal: Ability to verbalize frustrations and anger appropriately will improve Outcome: Progressing Goal: Ability to demonstrate self-control will improve Outcome: Progressing   Problem: Health Behavior/Discharge Planning: Goal: Identification of resources available to assist in meeting health care needs will improve Outcome: Progressing Goal: Compliance with treatment plan for underlying cause of condition will improve Outcome: Progressing   Problem: Physical Regulation: Goal: Ability to maintain clinical measurements within normal limits will improve Outcome: Progressing   Problem: Safety: Goal: Periods of time without injury will increase Outcome: Progressing   Problem: Education: Goal: Knowledge of [place] [facility] Education information/materials will improve Outcome: Progressing Goal: Emotional status will improve Outcome: Progressing Goal: Mental status will improve Outcome: Progressing Goal: Verbalization of understanding the information provided will improve Outcome: Progressing   Problem: Activity: Goal: Interest or engagement in activities will improve Outcome: Progressing Goal: Sleeping patterns will improve Outcome: Progressing   Problem: Coping: Goal: Ability to verbalize frustrations and anger appropriately will improve Outcome: Progressing Goal: Ability to demonstrate self-control will improve Outcome: Progressing   Problem: Health  Behavior/Discharge Planning: Goal: Identification of resources available to assist in meeting health care needs will improve Outcome: Progressing Goal: Compliance with treatment plan for underlying cause of condition will improve Outcome: Progressing   Problem: Physical Regulation: Goal: Ability to maintain clinical measurements within normal limits will improve Outcome: Progressing   Problem: Safety: Goal: Periods of time without injury will increase Outcome: Progressing   Problem: Activity: Goal: Will identify at least one activity in which they can participate Outcome: Progressing   Problem: Coping: Goal: Ability to identify and develop effective coping behavior will improve Outcome: Progressing Goal: Ability to interact with others will improve Outcome: Progressing Goal: Demonstration of participation in decision-making regarding own care will improve Outcome: Progressing Goal: Ability to use eye contact when communicating with others will improve Outcome: Progressing   Problem: Health Behavior/Discharge Planning: Goal: Identification of resources available to assist in meeting health care needs will improve Outcome: Progressing   Problem: Self-Concept: Goal: Will verbalize positive feelings about self Outcome: Progressing   Problem: Activity: Goal: Will verbalize the importance of balancing activity with adequate rest periods Outcome: Progressing   Problem: Education: Goal: Will be free of psychotic symptoms Outcome: Progressing Goal: Knowledge of the prescribed therapeutic regimen will improve Outcome: Progressing   Problem: Coping: Goal: Coping ability will improve Outcome: Progressing Goal: Will verbalize feelings Outcome: Progressing   Problem: Health Behavior/Discharge Planning: Goal: Compliance with prescribed medication regimen will improve Outcome: Progressing   Problem: Nutritional: Goal: Ability to achieve adequate nutritional intake will  improve Outcome: Progressing   Problem: Role Relationship: Goal: Ability to communicate needs accurately will improve Outcome: Progressing Goal: Ability to interact with others will improve Outcome: Progressing   Problem: Safety: Goal: Ability to redirect hostility and anger into socially appropriate behaviors will improve Outcome: Progressing Goal: Ability to remain free from injury will improve Outcome: Progressing   Problem: Self-Care: Goal: Ability to participate in self-care as condition permits will improve Outcome: Progressing   Problem: Self-Concept: Goal: Will verbalize positive feelings about self Outcome: Progressing

## 2024-03-29 NOTE — Progress Notes (Signed)
 [facility] MD Progress Note  03/29/2024 4:18 PM Allison Bolton  MRN:  [id number] Subjective:   Allison Bolton is a 53 yr old female who presented on 02/17/24 to [facility] ED with a chief complaint of anxiety. In the ED, she was found to be acutely psychotic with tangential speech, agitation, and delusional thought content. Patient was admitted involuntarily to [facility] on 8/14.  PPHx is significant for Substance-Induced Psychosis, Bipolar I Disorder, Stimulant Use Disorder, GAD, Suicide Attempt (2007), and Prior Psychiatric Hospitalizations (last- [facility] 01/2024).   Case was discussed in the multidisciplinary team. MAR was reviewed and patient was compliant with medications.  She received PRN Tylenol , and Hydroxyzine  yesterday.   Psychiatric Team made the following recommendations yesterday: -Continue Haldol  10 mg q12 for psychosis and mood stability -Continue Geodon  60 mg BID for psychosis and mood stability -Continue Depakote  DR 750 mg q12 for mood stability -Continue Ativan  0.5 mg QHS or agitation  -Continue Cogentin  1 mg BID for Drug Induced EPS    On interview today patient reports she slept good last night.  She reports her appetite is doing good.  She reports no SI, HI, or AVH.  She reports no Paranoia or Ideas of Reference.  She reports no issues with her medications.  She reports that she is working on getting her camper back today.  She reports no other concerns at present.    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 The direct patient care time included face-to-face time with the patient, reviewing the patient's chart, communicating  with other professionals, and coordinating care.    Past Psychiatric History:  Substance-Induced Psychosis, Bipolar I Disorder, Stimulant Use Disorder, GAD, Suicide Attempt (2007), and Prior Psychiatric Hospitalizations (last- [facility] 01/2024).  Past Medical History:  Past Medical History:  Diagnosis Date   Anxiety    Arthritis    Asthma    Bipolar 1 disorder (HCC)    Diverticulosis    Dysrhythmia    sts I have heart palpitations   Fibromyalgia    H/O hiatal hernia    4   Headache(784.0)    High blood pressure    Meniere's disease    S/P colonoscopy    Dr. Golda 2010: few small diverticula at sigmoid. otherwise normal.    Shortness of breath     Past Surgical History:  Procedure Laterality Date   ABDOMINAL HYSTERECTOMY     APPENDECTOMY     CESAREAN SECTION     CHOLECYSTECTOMY     COLONOSCOPY N/A 12/25/2023   Procedure: COLONOSCOPY;  Surgeon: Eartha Angelia Sieving, MD;  Location: [facility] ENDO SUITE;  Service: Gastroenterology;  Laterality: N/A;  11:30am, asa 1   exploratory laparoscopy     FOOT SURGERY     HEMORRHOID SURGERY     LAPAROSCOPIC APPENDECTOMY  02/19/2011   Procedure: APPENDECTOMY LAPAROSCOPIC;  Surgeon: Oneil DELENA Budge;  Location: [facility] ORS;  Service: General;  Laterality: N/A;   LAPAROSCOPY  02/19/2011   Procedure: LAPAROSCOPY DIAGNOSTIC;  Surgeon: Oneil DELENA Budge;  Location: [facility] ORS;  Service: General;  Laterality: N/A;   left ovarian removal     multiple hernia repairs     Right ovarian removal  TONSILLECTOMY     TONSILLECTOMY AND ADENOIDECTOMY     tubes in ears     UMBILICAL HERNIA REPAIR  Dec 2011   Dr. Mavis   wisdom teeth removal     Family History:  Family History  Problem Relation Age of Onset   Thyroid  disease Mother    Colon cancer Father    Breast cancer Maternal Aunt    Colon cancer Brother    Colon cancer Other        paternal and maternal grandfather   Meniere's disease Other    Anesthesia problems Neg Hx    Hypotension Neg Hx     Malignant hyperthermia Neg Hx    Pseudochol deficiency Neg Hx    Family Psychiatric  History:  None Reported   Social History:  Social History   Substance and Sexual Activity  Alcohol Use No   Comment: not since June 2012     Social History   Substance and Sexual Activity  Drug Use Yes   Types: Marijuana   Comment: patient denies any-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    Social History   Socioeconomic History   Marital status: Legally Separated    Spouse name: Not on file   Number of children: Not on file   Years of education: Not on file   Highest education level: Not on file  Occupational History   Not on file  Tobacco Use   Smoking status: Every Day    Current packs/day: 0.00    Average packs/day: 0.5 packs/day for 37.3 years (18.7 ttl pk-yrs)    Types: Cigarettes    Start date: 07/07/1981    Last attempt to quit: 11/05/2018    Years since quitting: 5.4   Smokeless tobacco: Never  Vaping Use   Vaping status: Never Used  Substance and Sexual Activity   Alcohol use: No    Comment: not since June 2012   Drug use: Yes    Types: Marijuana    Comment: patient denies any-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   Sexual activity: Never    Birth control/protection: Surgical  Other Topics Concern   Not on file  Social History Narrative   Not on file   Social Drivers of Health   Financial Resource Strain: Not on file  Food Insecurity: Food Insecurity Present (02/18/2024)   Hunger Vital Sign    Worried About Running Out of Food in the Last Year: Sometimes true    Ran Out of Food in the Last Year: Sometimes true  Transportation Needs: Unmet Transportation Needs (02/18/2024)   PRAPARE - Administrator, Civil Service (Medical): Yes    Lack of Transportation (Non-Medical): Yes  Physical Activity: Not on file  Stress: Not on file  Social Connections: Unknown (10/14/2021)   Received from [facility]   Social Connections    Do your  friends and family support you?: Not on file    What agencies support you?: Not on file   Additional Social History:                         Sleep: Good Estimated Sleeping Duration (Last 24 Hours): 6.50-7.75 hours  Appetite:  Good  Current Medications: Current Facility-Administered Medications  Medication Dose Route Frequency Provider Last Rate Last Admin   acetaminophen  (TYLENOL ) tablet 650 mg  650 mg Oral Q6H PRN White, Patrice L, NP   650 mg  at 03/28/24 2058   alum & mag hydroxide-simeth (MAALOX/MYLANTA) 200-200-20 MG/5ML suspension 30 mL  30 mL Oral Q4H PRN White, Patrice L, NP   30 mL at 03/24/24 0217   benztropine  (COGENTIN ) tablet 1 mg  1 mg Oral BID White, Patrice L, NP   1 mg at 03/29/24 9176   divalproex  (DEPAKOTE ) DR tablet 750 mg  750 mg Oral Q12H Ji, Andrew, MD   750 mg at 03/29/24 9176   haloperidol  (HALDOL ) tablet 10 mg  10 mg Oral Q12H Ji, Andrew, MD   10 mg at 03/29/24 9176   hydrOXYzine  (ATARAX ) tablet 25 mg  25 mg Oral TID PRN White, Patrice L, NP   25 mg at 03/28/24 2058   LORazepam  (ATIVAN ) tablet 0.5 mg  0.5 mg Oral QHS Marry Clamp, MD   0.5 mg at 03/28/24 2059   magnesium  hydroxide (MILK OF MAGNESIA) suspension 30 mL  30 mL Oral Daily PRN White, Patrice L, NP       nicotine  polacrilex (NICORETTE ) gum 2 mg  2 mg Oral PRN Trudy Carwin, NP   2 mg at 03/28/24 1716   OLANZapine  (ZYPREXA ) injection 10 mg  10 mg Intramuscular TID PRN White, Patrice L, NP       OLANZapine  (ZYPREXA ) injection 5 mg  5 mg Intramuscular TID PRN White, Patrice L, NP       OLANZapine  zydis (ZYPREXA ) disintegrating tablet 5 mg  5 mg Oral TID PRN White, Patrice L, NP   5 mg at 03/13/24 2221   ondansetron  (ZOFRAN -ODT) disintegrating tablet 4 mg  4 mg Oral Q8H PRN Lynnette Barter, MD   4 mg at 03/26/24 1801   traZODone  (DESYREL ) tablet 50 mg  50 mg Oral QHS White, Patrice L, NP   50 mg at 03/28/24 2058   ziprasidone  (GEODON ) capsule 60 mg  60 mg Oral BID WC Marry Clamp, MD   60 mg at  03/29/24 9176    Lab Results:  No results found for this or any previous visit (from the past 48 hours).   Blood Alcohol level:  Lab Results  Component Value Date   [facility] <15 02/17/2024   ETH <15 01/14/2024    Metabolic Disorder Labs: Lab Results  Component Value Date   HGBA1C 5.1 01/18/2024   MPG 99.67 01/18/2024   MPG 105.41 05/11/2019   Lab Results  Component Value Date   PROLACTIN 24.1 (H) 05/11/2019   Lab Results  Component Value Date   CHOL 154 01/18/2024   TRIG 180 (H) 01/18/2024   HDL 49 01/18/2024   CHOLHDL 3.1 01/18/2024   VLDL 36 01/18/2024   LDLCALC 69 01/18/2024   LDLCALC 82 05/11/2019    Physical Findings: AIMS:  ,  ,  ,  ,  ,  ,   CIWA:    COWS:     Musculoskeletal: Strength & Muscle Tone: within normal limits Gait & Station: normal Patient leans: N/A  Psychiatric Specialty Exam:  Presentation  General Appearance:  Disheveled  Eye Contact: Good  Speech: Normal Rate (some slurring present)  Speech Volume: Normal  Handedness:No data recorded  Mood and Affect  Mood: -- (good)  Affect: Inappropriate (coonstantly smiling)   Thought Process  Thought Processes: Linear  Descriptions of Associations:Loose  Orientation:Partial  Thought Content:Scattered  History of Schizophrenia/Schizoaffective disorder:No  Duration of Psychotic Symptoms:Greater than six months  Hallucinations:Hallucinations: None   Ideas of Reference:None  Suicidal Thoughts:Suicidal Thoughts: No   Homicidal Thoughts:Homicidal Thoughts: No    Sensorium  Memory: Immediate Poor  Judgment: Impaired  Insight: Lacking   Executive Functions  Concentration: Fair  Attention Span: Fair  Recall: [employer] of Knowledge: Fair  Language: Fair   Psychomotor Activity  Psychomotor Activity:Psychomotor Activity: Normal    Assets  Assets: Resilience   Sleep  Sleep:Sleep: Good     Physical Exam: Physical Exam Vitals and  nursing note reviewed.  Constitutional:      General: She is not in acute distress.    Appearance: She is normal weight. She is not ill-appearing or toxic-appearing.  HENT:     Head: Normocephalic and atraumatic.  Pulmonary:     Effort: Pulmonary effort is normal.  Neurological:     General: No focal deficit present.     Mental Status: She is alert.    Review of Systems  Respiratory:  Negative for cough and shortness of breath.   Cardiovascular:  Negative for chest pain.  Gastrointestinal:  Negative for abdominal pain, constipation, diarrhea, nausea and vomiting.  Neurological:  Negative for dizziness, weakness and headaches.  Psychiatric/Behavioral:  Negative for depression, hallucinations and suicidal ideas. The patient is not nervous/anxious.    Blood pressure 101/62, pulse 71, temperature (!) 97.4 F (36.3 C), temperature source Oral, resp. rate 16, height 5' 6 (1.676 m), weight 80.7 kg, SpO2 100%. Body mass index is 28.71 kg/m.   Treatment Plan Summary: Daily contact with patient to assess and evaluate symptoms and progress in treatment and Medication management  Allison Bolton is a 53 yr old female who presented on 02/17/24 to [facility] ED with a chief complaint of anxiety. In the ED, she was found to be acutely psychotic with tangential speech, agitation, and delusional thought content. Patient was admitted involuntarily to [facility] on 8/14.  PPHx is significant for Substance-Induced Psychosis, Bipolar I Disorder, Stimulant Use Disorder, GAD, Suicide Attempt (2007), and Prior Psychiatric Hospitalizations (last- [facility] 01/2024).    Allison Bolton is no longer manic and is more talkative during interview, however, she is delusional and significantly cognitively impaired.  She was given a MOCA on 9/17 and scored an 8,  She does not shower without significant prompting and is not eating despite stating her appetite is good.  Discussed with staff the need for prompting when food is brought  to her room and to measure I's and O's.  [facility] SW is seeking a guardian for the patient as she needs 24 hour assistance at this point and they are looking for placement at either SNF or group home.  We will not make any changes to her medications at this time.  We will continue to monitor.    Substance-induced psychosis  Bipolar disorder: -Continue Haldol  10 mg q12 for psychosis and mood stability -Continue Geodon  60 mg BID for psychosis and mood stability -Continue Depakote  DR 750 mg q12 for mood stability -Continue Ativan  0.5 mg QHS or agitation  -Continue Cogentin  1 mg BID for Drug Induced EPS -Continue Agitation Protocol: Zyprexa    Nicotine  Dependence: -Continue Nicotine  Gum 2 mg PRN   -Continue Zofran  4 mg q8 PRN nausea/vomiting -Continue PRN's: Tylenol , Maalox, Atarax , Milk of Magnesia, Trazodone    --  The risks/benefits/side-effects/alternatives to medications were discussed in detail with the patient and time was given for questions. The patient consents to medication trials.                -- Metabolic profile and EKG monitoring obtained while on an atypical antipsychotic (BMI: 28.73 Lipid Panel: WNL except Trig: 180 HbgA1c:  5.1)              -- Encouraged patient to participate in unit milieu and in scheduled group therapies              -- Short Term Goals: Ability to identify changes in lifestyle to reduce recurrence of condition will improve, Ability to verbalize feelings will improve, Ability to disclose and discuss suicidal ideas, Ability to demonstrate self-control will improve, Ability to identify and develop effective coping behaviors will improve, Ability to maintain clinical measurements within normal limits will improve, Compliance with prescribed medications will improve, and Ability to identify triggers associated with substance abuse/mental health issues will improve             -- Long Term Goals: Improvement in symptoms so as ready for discharge   Safety and  Monitoring:             -- Involuntary admission to inpatient psychiatric unit for safety, stabilization and treatment             -- Daily contact with patient to assess and evaluate symptoms and progress in treatment             -- Patient's case to be discussed in multi-disciplinary team meeting             -- Observation Level : q15 minute checks             -- Vital signs:  q12 hours             -- Precautions: suicide, elopement, and assault  Discharge Planning:              -- Social work and case management to assist with discharge planning and identification of hospital follow-up needs prior to discharge             -- Estimated LOS: 7+ more days             -- Discharge Concerns: Need to establish a safety plan; Medication compliance and effectiveness             -- Discharge Goals: Return home with outpatient referrals for mental health follow-up including medication management/psychotherapy   Marsa GORMAN Rosser, DO 03/29/2024, 4:18 PM

## 2024-03-29 NOTE — Group Note (Signed)
 Date:  03/29/2024 Time:  8:42 PM  Group Topic/Focus:  Wrap-Up Group:   The focus of this group is to help patients review their daily goal of treatment and discuss progress on daily workbooks.    Participation Level:  Did Not Attend  Participation Quality:  Did Not Attend  Affect:  Did Not Attend  Cognitive:  Did Not Attend  Insight: None  Engagement in Group:  Did Not Attend  Modes of Intervention:  Did Not Attend  Additional Comments:  Pt was encouraged to attend wrap up group but did not attend.  Lonni Na 03/29/2024, 8:42 PM

## 2024-03-29 NOTE — Plan of Care (Signed)
   Problem: Education: Goal: Knowledge of [facility] Education information/materials will improve Outcome: Progressing Goal: Emotional status will improve Outcome: Progressing Goal: Mental status will improve Outcome: Progressing Goal: Verbalization of understanding the information provided will improve Outcome: Progressing

## 2024-03-29 NOTE — Progress Notes (Signed)
 Pt observed medication compliant. Pt wanders about unit, preoccupied with someone who is stealing from her. Pt mumbles to herself. Pt denies SI/HI/AVH this morning to nurse however on self inventory sheet pt endorses suicidal thoughts.

## 2024-03-29 NOTE — Group Note (Signed)
 Recreation Therapy Group Note   Group Topic:Problem Solving  Group Date: 03/29/2024 Start Time: 1040 End Time: 1100 Facilitators: Nizhoni Parlow-McCall, LRT,CTRS Location: [address] Dayroom   Group Topic: Problem Solving   Goal Area(s) Addresses:  Patient will effectively work in a team with other group members. Patient will verbalize importance of using appropriate problem solving techniques.   Behavioral Response: Moderate  Intervention: Brain Teasers, Codebreakers  Activity: Patients were given two worksheets that had various brain teasers and code breakers. Patients could work together to figure out the teasers and crack the codes presented. LRT and patients would go over the answers once completed.   Education: Problem Solving  Education Outcome: Acknowledges understanding/In group clarification offered/Needs additional education.    Affect/Mood: Happy   Participation Level: Moderate   Participation Quality: Independent   Behavior: Cooperative   Speech/Thought Process: Incoherent   Insight: Lacking   Judgement: Lacking    Modes of Intervention: Worksheet   Patient Response to Interventions:  Receptive   Education Outcome:  In group clarification offered    Clinical Observations/Individualized Feedback: Pt was bright and happy throughout group. Pt was social but incoherent with her speech. Pt attempted to complete activity after being helped by peer and LRT. Pt was appropriate and attentive during group.    Plan: Continue to engage patient in RT group sessions 2-3x/week.   Rachid Parham-McCall, LRT,CTRS 03/29/2024 1:18 PM

## 2024-03-29 NOTE — Progress Notes (Signed)
   03/28/24 2030  Psych Admission Type (Psych Patients Only)  Admission Status Involuntary  Psychosocial Assessment  Patient Complaints None  Eye Contact Poor  Facial Expression Fixed smile  Affect Preoccupied  Speech Incoherent  Interaction Hostile;Minimal  Motor Activity Other (Comment) (WNL)  Appearance/Hygiene Poor hygiene  Behavior Characteristics Appropriate to situation  Mood Preoccupied  Thought Process  Coherency Disorganized  Content Preoccupation  Delusions None reported or observed  Perception Derealization  Hallucination None reported or observed  Judgment Poor  Confusion Mild  Danger to Self  Current suicidal ideation?  (Denies)  Agreement Not to Harm Self Yes  Description of Agreement Notify Staff  Danger to Others  Danger to Others None reported or observed

## 2024-03-29 NOTE — Progress Notes (Addendum)
 Collateral contact - [facility] Medicaid - [phone]  CSW Asberry informed that her care manager, Elveria Lacks [phone], can help with placement options and discharge planning. Reference # [id number]  CSW left a voicemail for patient's care manager, Elveria Lacks [phone]   [facility] - [phone]   CSW was advised to call:    [facility] and [facility] [address] [place], [place], [place] [address] [phone] nursing home  [facility] and [facility] [address] [place], [place] [address] [phone] nursing home   Allison Bolton, [place] 03/29/2024

## 2024-03-29 NOTE — Progress Notes (Signed)
 Pt came to nurses station complaining of hunger. Pt poor historian about food consumed. Nurse in pts room, noted on bench was breakfast and lunch tray untouched. Pt received hot tray and is eating on unit at this time.

## 2024-03-30 DIAGNOSIS — F312 Bipolar disorder, current episode manic severe with psychotic features: Secondary | ICD-10-CM | POA: Diagnosis not present

## 2024-03-30 NOTE — Progress Notes (Signed)
   03/30/24 2330  Psych Admission Type (Psych Patients Only)  Admission Status Involuntary  Psychosocial Assessment  Patient Complaints Confusion  Eye Contact Fair  Facial Expression Fixed smile;Animated  Affect Preoccupied  Speech Soft  Interaction Childlike  Motor Activity Slow  Appearance/Hygiene In scrubs;Disheveled  Behavior Characteristics Cooperative  Mood Preoccupied  Aggressive Behavior  Effect No apparent injury  Thought Process  Coherency Disorganized  Content Preoccupation  Delusions None reported or observed  Perception Derealization  Hallucination None reported or observed  Judgment Poor  Confusion Mild  Danger to Self  Current suicidal ideation? Denies

## 2024-03-30 NOTE — Progress Notes (Addendum)
 Collateral contact - care manager, Elveria Lacks [phone]   CSW left a voicemail.   [facility] and [facility] [address] [place], [place], [place] [address] [phone] Our admission person is not here today.  CSW was advised to call Isaiah, [phone].  Isaiah said that they have a memory care bed available in the [place] building [facility] [employer]).  Patient signed the ROI and the documents (FL2 and Dr. Thressa note) were sent via Epic. [facility] Berber called and informed that they will not be able to accept patient due to psychiatric diagnosis.   [facility] [address] [place], [place] [address] [phone] nursing home  CSW was advised that they currently have 17 patient on the waiting list, awaiting long-term Medicaid, and they don't accept new patients at this time.   [address] [place], [place] [address] [phone]  They don't have any beds available.  CSW was advised if patient was diagnosed with dementia or Alzheimer, CSW can call [address], [address] [place], [place], [place] [address], [phone].    Dezeray Puccio, LCSWA 03/30/2024

## 2024-03-30 NOTE — [facility] Group Notes (Signed)
 Adult Psychoeducational Group Note  Date:  03/30/2024 Time:  4:53 PM  Group Topic/Focus:  Goals Group:   The focus of this group is to help patients establish daily goals to achieve during treatment and discuss how the patient can incorporate goal setting into their daily lives to aide in recovery. Orientation:   The focus of this group is to educate the patient on the purpose and policies of crisis stabilization and provide a format to answer questions about their admission.  The group details unit policies and expectations of patients while admitted.  Participation Level:  Did Not Attend  Participation Quality:    Affect:    Cognitive:    Insight:   Engagement in Group:    Modes of Intervention:    Additional Comments:    Toben Acuna O 03/30/2024, 4:53 PM

## 2024-03-30 NOTE — Plan of Care (Signed)
   Problem: Education: Goal: Emotional status will improve Outcome: Progressing Goal: Mental status will improve Outcome: Progressing Goal: Verbalization of understanding the information provided will improve Outcome: Progressing   Problem: Activity: Goal: Interest or engagement in activities will improve Outcome: Progressing

## 2024-03-30 NOTE — Group Note (Signed)
 Recreation Therapy Group Note   Group Topic:Team Building  Group Date: 03/30/2024 Start Time: 1010 End Time: 1050 Facilitators: Zulay Corrie-McCall, LRT,CTRS Location: [address] Dayroom   Group Topic: Communication, Team Building, Problem Solving  Goal Area(s) Addresses:  Patient will effectively work with peer towards shared goal.  Patient will identify skills used to make activity successful.  Patient will share challenges and verbalize solution-driven approaches used. Patient will identify how skills used during activity can be used to reach post d/c goals.   Behavioral Response: None  Intervention: STEM Activity   Activity: [employer]. Patients were provided the following materials: 5 drinking straws, 5 rubber bands, 5 paper clips, 2 index cards, 2 drinking cups, and 2 toilet paper rolls. Using the provided materials patients were asked to build a launching mechanism to launch a ping pong ball across the room, approximately 10 feet. Patients were divided into teams of 3-5. Instructions required all materials be incorporated into the device, functionality of items left to the peer group's discretion.  Education: Pharmacist, community, Scientist, physiological, Air cabin crew, Building control surveyor.   Education Outcome: Acknowledges education/In group clarification offered/Needs additional education.    Affect/Mood: Appropriate   Participation Level: None   Participation Quality: None   Behavior: On-looking   Speech/Thought Process: Incoherent   Insight: None   Judgement: None   Modes of Intervention: STEM Activity   Patient Response to Interventions:  Attentive   Education Outcome:  In group clarification offered    Clinical Observations/Individualized Feedback: Pt was smiling and eating her snack during group. Pt observed peers as they worked on activity.    Plan: Continue to engage patient in RT group sessions 2-3x/week.   Sweet Jarvis-McCall, LRT,CTRS 03/30/2024  12:50 PM

## 2024-03-30 NOTE — Progress Notes (Signed)
(  Sleep Hours) -6.25 (Any PRNs that were needed, meds refused, or side effects to meds)- Zofran  (Any disturbances and when (visitation, over night)-none (Concerns raised by the patient)- pt reported she was having vaginal burning.Pt also has a strong fish like odor.  (SI/HI/AVH)-Denied

## 2024-03-30 NOTE — Progress Notes (Signed)
   03/30/24 0800  Psych Admission Type (Psych Patients Only)  Admission Status Involuntary  Psychosocial Assessment  Patient Complaints Confusion;Other (Comment) (I miss my children I sometimes want to hurt a woman- I don't know her last name)  Eye Contact Fair  Facial Expression Animated  Affect Preoccupied  Speech Soft  Interaction Childlike;Minimal  Motor Activity Slow  Appearance/Hygiene In hospital gown;Disheveled  Behavior Characteristics Cooperative  Mood Preoccupied  Thought Process  Coherency Disorganized  Content Preoccupation  Delusions None reported or observed  Perception Derealization  Hallucination None reported or observed  Judgment Poor  Confusion Mild  Danger to Self  Current suicidal ideation? Denies  Agreement Not to Harm Self Yes  Description of Agreement verbal  Danger to Others  Danger to Others None reported or observed

## 2024-03-30 NOTE — Group Note (Signed)
 Date:  03/30/2024 Time:  8:52 PM  Group Topic/Focus:  Wrap-Up Group:   The focus of this group is to help patients review their daily goal of treatment and discuss progress on daily workbooks.    Participation Level:  Did Not Attend  Participation Quality:  Did Not Attend  Affect:  Did Not Attend  Cognitive:  Did Not Attend  Insight: None  Engagement in Group:  Did Not Attend  Modes of Intervention:  Did Not Attend   Additional Comments:  Pt was encouraged to attend wrap up group but did not attend.  Lonni Na 03/30/2024, 8:52 PM

## 2024-03-30 NOTE — Progress Notes (Addendum)
 [facility] MD Progress Note  03/30/2024 12:59 PM Allison Bolton  MRN:  [id number] Subjective:   Allison Bolton is a 53 yr old female who presented on 02/17/24 to [facility] ED with a chief complaint of anxiety. In the ED, she was found to be acutely psychotic with tangential speech, agitation, and delusional thought content. Patient was admitted involuntarily to [facility] on 8/14.  PPHx is significant for Substance-Induced Psychosis, Bipolar I Disorder, Stimulant Use Disorder, GAD, Suicide Attempt (2007), and Prior Psychiatric Hospitalizations (last- [facility] 01/2024).   Case was discussed in the multidisciplinary team. MAR was reviewed and patient was compliant with medications.  She received PRN Hydroxyzine  yesterday.   Psychiatric Team made the following recommendations yesterday: -Continue Haldol  10 mg q12 for psychosis and mood stability -Continue Geodon  60 mg BID for psychosis and mood stability -Continue Depakote  DR 750 mg q12 for mood stability -Continue Ativan  0.5 mg QHS or agitation  -Continue Cogentin  1 mg BID for Drug Induced EPS     On interview today patient reports she slept good last night.  She reports her appetite is doing good.  She reports no SI, HI, or AVH.  She reports no Paranoia or Ideas of Reference.  She reports no issues with her medications.  When asked if she needed anything she reported no.    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 The direct patient care time included face-to-face time with the patient, reviewing the patient's chart, communicating with other professionals, and coordinating care.    Past  Psychiatric History:  Substance-Induced Psychosis, Bipolar I Disorder, Stimulant Use Disorder, GAD, Suicide Attempt (2007), and Prior Psychiatric Hospitalizations (last- [facility] 01/2024).  Past Medical History:  Past Medical History:  Diagnosis Date   Anxiety    Arthritis    Asthma    Bipolar 1 disorder (HCC)    Diverticulosis    Dysrhythmia    sts I have heart palpitations   Fibromyalgia    H/O hiatal hernia    4   Headache(784.0)    High blood pressure    Meniere's disease    S/P colonoscopy    Dr. Golda 2010: few small diverticula at sigmoid. otherwise normal.    Shortness of breath     Past Surgical History:  Procedure Laterality Date   ABDOMINAL HYSTERECTOMY     APPENDECTOMY     CESAREAN SECTION     CHOLECYSTECTOMY     COLONOSCOPY N/A 12/25/2023   Procedure: COLONOSCOPY;  Surgeon: Eartha Angelia Sieving, MD;  Location: [facility];  Service: Gastroenterology;  Laterality: N/A;  11:30am, asa 1   exploratory laparoscopy     FOOT SURGERY     HEMORRHOID SURGERY     LAPAROSCOPIC APPENDECTOMY  02/19/2011   Procedure: APPENDECTOMY LAPAROSCOPIC;  Surgeon: Oneil DELENA Budge;  Location: [facility] ORS;  Service: General;  Laterality: N/A;   LAPAROSCOPY  02/19/2011   Procedure: LAPAROSCOPY DIAGNOSTIC;  Surgeon: Oneil DELENA Budge;  Location: [facility] ORS;  Service: General;  Laterality: N/A;   left ovarian removal     multiple hernia repairs     Right ovarian removal     TONSILLECTOMY     TONSILLECTOMY AND ADENOIDECTOMY  tubes in ears     UMBILICAL HERNIA REPAIR  Dec 2011   Dr. Mavis   wisdom teeth removal     Family History:  Family History  Problem Relation Age of Onset   Thyroid  disease Mother    Colon cancer Father    Breast cancer Maternal Aunt    Colon cancer Brother    Colon cancer Other        paternal and maternal grandfather   Meniere's disease Other    Anesthesia problems Neg Hx    Hypotension Neg Hx    Malignant hyperthermia Neg Hx    Pseudochol deficiency Neg Hx     Family Psychiatric  History:  None Reported   Social History:  Social History   Substance and Sexual Activity  Alcohol Use No   Comment: not since June 2012     Social History   Substance and Sexual Activity  Drug Use Yes   Types: Marijuana   Comment: patient denies any-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    Social History   Socioeconomic History   Marital status: Legally Separated    Spouse name: Not on file   Number of children: Not on file   Years of education: Not on file   Highest education level: Not on file  Occupational History   Not on file  Tobacco Use   Smoking status: Every Day    Current packs/day: 0.00    Average packs/day: 0.5 packs/day for 37.3 years (18.7 ttl pk-yrs)    Types: Cigarettes    Start date: 07/07/1981    Last attempt to quit: 11/05/2018    Years since quitting: 5.4   Smokeless tobacco: Never  Vaping Use   Vaping status: Never Used  Substance and Sexual Activity   Alcohol use: No    Comment: not since June 2012   Drug use: Yes    Types: Marijuana    Comment: patient denies any-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   Sexual activity: Never    Birth control/protection: Surgical  Other Topics Concern   Not on file  Social History Narrative   Not on file   Social Drivers of Health   Financial Resource Strain: Not on file  Food Insecurity: Food Insecurity Present (02/18/2024)   Hunger Vital Sign    Worried About Running Out of Food in the Last Year: Sometimes true    Ran Out of Food in the Last Year: Sometimes true  Transportation Needs: Unmet Transportation Needs (02/18/2024)   PRAPARE - Administrator, Civil Service (Medical): Yes    Lack of Transportation (Non-Medical): Yes  Physical Activity: Not on file  Stress: Not on file  Social Connections: Unknown (10/14/2021)   Received from [facility]   Social Connections    Do your friends and family support you?: Not on file    What agencies  support you?: Not on file   Additional Social History:                         Sleep: Good Estimated Sleeping Duration (Last 24 Hours): 5.00-5.75 hours  Appetite:  Good  Current Medications: Current Facility-Administered Medications  Medication Dose Route Frequency Provider Last Rate Last Admin   acetaminophen  (TYLENOL ) tablet 650 mg  650 mg Oral Q6H PRN White, Patrice L, NP   650 mg at 03/28/24 2058   alum & mag hydroxide-simeth (MAALOX/MYLANTA) 200-200-20 MG/5ML  suspension 30 mL  30 mL Oral Q4H PRN White, Patrice L, NP   30 mL at 03/24/24 0217   benztropine  (COGENTIN ) tablet 1 mg  1 mg Oral BID White, Patrice L, NP   1 mg at 03/30/24 0818   divalproex  (DEPAKOTE ) DR tablet 750 mg  750 mg Oral Q12H Ji, Andrew, MD   750 mg at 03/30/24 9182   haloperidol  (HALDOL ) tablet 10 mg  10 mg Oral Q12H Ji, Andrew, MD   10 mg at 03/30/24 9182   hydrOXYzine  (ATARAX ) tablet 25 mg  25 mg Oral TID PRN White, Patrice L, NP   25 mg at 03/29/24 2359   LORazepam  (ATIVAN ) tablet 0.5 mg  0.5 mg Oral QHS Marry Clamp, MD   0.5 mg at 03/29/24 2031   magnesium  hydroxide (MILK OF MAGNESIA) suspension 30 mL  30 mL Oral Daily PRN White, Patrice L, NP       nicotine  polacrilex (NICORETTE ) gum 2 mg  2 mg Oral PRN Trudy Carwin, NP   2 mg at 03/28/24 1716   OLANZapine  (ZYPREXA ) injection 10 mg  10 mg Intramuscular TID PRN White, Patrice L, NP       OLANZapine  (ZYPREXA ) injection 5 mg  5 mg Intramuscular TID PRN White, Patrice L, NP       OLANZapine  zydis (ZYPREXA ) disintegrating tablet 5 mg  5 mg Oral TID PRN White, Patrice L, NP   5 mg at 03/13/24 2221   ondansetron  (ZOFRAN -ODT) disintegrating tablet 4 mg  4 mg Oral Q8H PRN Lynnette Barter, MD   4 mg at 03/30/24 0112   traZODone  (DESYREL ) tablet 50 mg  50 mg Oral QHS White, Patrice L, NP   50 mg at 03/29/24 2031   ziprasidone  (GEODON ) capsule 60 mg  60 mg Oral BID WC Marry Clamp, MD   60 mg at 03/30/24 9182    Lab Results:  No results found for this or  any previous visit (from the past 48 hours).   Blood Alcohol level:  Lab Results  Component Value Date   [facility] <15 02/17/2024   ETH <15 01/14/2024    Metabolic Disorder Labs: Lab Results  Component Value Date   HGBA1C 5.1 01/18/2024   MPG 99.67 01/18/2024   MPG 105.41 05/11/2019   Lab Results  Component Value Date   PROLACTIN 24.1 (H) 05/11/2019   Lab Results  Component Value Date   CHOL 154 01/18/2024   TRIG 180 (H) 01/18/2024   HDL 49 01/18/2024   CHOLHDL 3.1 01/18/2024   VLDL 36 01/18/2024   LDLCALC 69 01/18/2024   LDLCALC 82 05/11/2019    Physical Findings: AIMS:  ,  ,  ,  ,  ,  ,   CIWA:    COWS:     Musculoskeletal: Strength & Muscle Tone: within normal limits Gait & Station: normal Patient leans: N/A  Psychiatric Specialty Exam:  Presentation  General Appearance:  Disheveled  Eye Contact: Poor  Speech: -- (minimal, yes or no)  Speech Volume: Normal  Handedness:No data recorded  Mood and Affect  Mood: -- (fine)  Affect: Non-Congruent   Thought Process  Thought Processes: -- (minimal)  Descriptions of Associations:Loose  Orientation:None  Thought Content:Scattered  History of Schizophrenia/Schizoaffective disorder:No  Duration of Psychotic Symptoms:Greater than six months  Hallucinations:Hallucinations: None   Ideas of Reference:None  Suicidal Thoughts:Suicidal Thoughts: No   Homicidal Thoughts:Homicidal Thoughts: No    Sensorium  Memory: Immediate Poor  Judgment: Impaired  Insight: Lacking   Art therapist  Concentration: Fair  Attention Span: Fair  Recall: [employer] of Knowledge: Fair  Language: Fair   Psychomotor Activity  Psychomotor Activity:Psychomotor Activity: Normal    Assets  Assets: Resilience   Sleep  Sleep:Sleep: Good     Physical Exam: Physical Exam Vitals and nursing note reviewed.  Constitutional:      General: She is not in acute distress.     Appearance: Normal appearance. She is normal weight. She is not toxic-appearing.  HENT:     Head: Normocephalic and atraumatic.  Pulmonary:     Effort: Pulmonary effort is normal.  Neurological:     Mental Status: She is alert.    Review of Systems  Respiratory:  Negative for cough and shortness of breath.   Cardiovascular:  Negative for chest pain.  Gastrointestinal:  Negative for abdominal pain, constipation, diarrhea, nausea and vomiting.  Neurological:  Negative for dizziness, weakness and headaches.  Psychiatric/Behavioral:  Negative for depression, hallucinations and suicidal ideas. The patient is not nervous/anxious.    Blood pressure 105/74, pulse 75, temperature 97.8 F (36.6 C), temperature source Oral, resp. rate 16, height 5' 6 (1.676 m), weight 80.7 kg, SpO2 95%. Body mass index is 28.71 kg/m.   Treatment Plan Summary: Daily contact with patient to assess and evaluate symptoms and progress in treatment and Medication management  Allison Bolton is a 53 yr old female who presented on 02/17/24 to [facility] ED with a chief complaint of anxiety. In the ED, she was found to be acutely psychotic with tangential speech, agitation, and delusional thought content. Patient was admitted involuntarily to [facility] on 8/14.  PPHx is significant for Substance-Induced Psychosis, Bipolar I Disorder, Stimulant Use Disorder, GAD, Suicide Attempt (2007), and Prior Psychiatric Hospitalizations (last- [facility] 01/2024).   She is minimally interactive today mainly saying yes or no to answers.  She continues to need significant prompting to do ADL's.  We will not make any changes to her medications at this time.  We will continue to monitor.    Substance-induced psychosis  Bipolar disorder: -Continue Haldol  10 mg q12 for psychosis and mood stability -Continue Geodon  60 mg BID for psychosis and mood stability -Continue Depakote  DR 750 mg q12 for mood stability -Continue Ativan  0.5 mg QHS or  agitation  -Continue Cogentin  1 mg BID for Drug Induced EPS -Continue Agitation Protocol: Zyprexa    Dementia in other diseases classified elsewhere, unspecified severity, without behavioral disturbance, psychotic disturbance, mood disturbance, and anxiety: Allison Bolton was given a MOCA on 9/17 and scored an 8.  [facility] SW is seeking a guardian for the patient as she needs 24 hour assistance at this point and they are looking for placement at either SNF or group home.     Nicotine  Dependence: -Continue Nicotine  Gum 2 mg PRN   -Continue Zofran  4 mg q8 PRN nausea/vomiting -Continue PRN's: Tylenol , Maalox, Atarax , Milk of Magnesia, Trazodone    --  The risks/benefits/side-effects/alternatives to medications were discussed in detail with the patient and time was given for questions. The patient consents to medication trials.                -- Metabolic profile and EKG monitoring obtained while on an atypical antipsychotic (BMI: 28.73 Lipid Panel: WNL except Trig: 180 HbgA1c: 5.1)              -- Encouraged patient to participate in unit milieu and in scheduled group therapies              --  Short Term Goals: Ability to identify changes in lifestyle to reduce recurrence of condition will improve, Ability to verbalize feelings will improve, Ability to disclose and discuss suicidal ideas, Ability to demonstrate self-control will improve, Ability to identify and develop effective coping behaviors will improve, Ability to maintain clinical measurements within normal limits will improve, Compliance with prescribed medications will improve, and Ability to identify triggers associated with substance abuse/mental health issues will improve             -- Long Term Goals: Improvement in symptoms so as ready for discharge   Safety and Monitoring:             -- Involuntary admission to inpatient psychiatric unit for safety, stabilization and treatment             -- Daily contact with patient to assess and  evaluate symptoms and progress in treatment             -- Patient's case to be discussed in multi-disciplinary team meeting             -- Observation Level : q15 minute checks             -- Vital signs:  q12 hours             -- Precautions: suicide, elopement, and assault  Discharge Planning:              -- Social work and case management to assist with discharge planning and identification of hospital follow-up needs prior to discharge             -- Estimated LOS: 7+ more days             -- Discharge Concerns: Need to establish a safety plan; Medication compliance and effectiveness             -- Discharge Goals: Return home with outpatient referrals for mental health follow-up including medication management/psychotherapy   Marsa GORMAN Rosser, DO 03/30/2024, 12:59 PM

## 2024-03-31 DIAGNOSIS — F19959 Other psychoactive substance use, unspecified with psychoactive substance-induced psychotic disorder, unspecified: Secondary | ICD-10-CM | POA: Diagnosis not present

## 2024-03-31 DIAGNOSIS — F0280 Dementia in other diseases classified elsewhere without behavioral disturbance: Secondary | ICD-10-CM

## 2024-03-31 DIAGNOSIS — N390 Urinary tract infection, site not specified: Secondary | ICD-10-CM | POA: Diagnosis not present

## 2024-03-31 DIAGNOSIS — F312 Bipolar disorder, current episode manic severe with psychotic features: Secondary | ICD-10-CM | POA: Diagnosis not present

## 2024-03-31 DIAGNOSIS — F17200 Nicotine dependence, unspecified, uncomplicated: Secondary | ICD-10-CM

## 2024-03-31 LAB — CBC WITH DIFFERENTIAL/PLATELET
Abs Immature Granulocytes: 0.02 K/uL (ref 0.00–0.07)
Basophils Absolute: 0.1 K/uL (ref 0.0–0.1)
Basophils Relative: 1 %
Eosinophils Absolute: 0.4 K/uL (ref 0.0–0.5)
Eosinophils Relative: 4 %
HCT: 42.8 % (ref 36.0–46.0)
Hemoglobin: 13.8 g/dL (ref 12.0–15.0)
Immature Granulocytes: 0 %
Lymphocytes Relative: 36 %
Lymphs Abs: 3 K/uL (ref 0.7–4.0)
MCH: 28.2 pg (ref 26.0–34.0)
MCHC: 32.2 g/dL (ref 30.0–36.0)
MCV: 87.5 fL (ref 80.0–100.0)
Monocytes Absolute: 0.8 K/uL (ref 0.1–1.0)
Monocytes Relative: 9 %
Neutro Abs: 4.2 K/uL (ref 1.7–7.7)
Neutrophils Relative %: 50 %
Platelets: 185 K/uL (ref 150–400)
RBC: 4.89 MIL/uL (ref 3.87–5.11)
RDW: 14 % (ref 11.5–15.5)
WBC: 8.4 K/uL (ref 4.0–10.5)
nRBC: 0 % (ref 0.0–0.2)

## 2024-03-31 LAB — COMPREHENSIVE METABOLIC PANEL WITH GFR
ALT: 8 U/L (ref 0–44)
AST: 14 U/L — ABNORMAL LOW (ref 15–41)
Albumin: 4.1 g/dL (ref 3.5–5.0)
Alkaline Phosphatase: 52 U/L (ref 38–126)
Anion gap: 11 (ref 5–15)
BUN: 17 mg/dL (ref 6–20)
CO2: 24 mmol/L (ref 22–32)
Calcium: 9.6 mg/dL (ref 8.9–10.3)
Chloride: 104 mmol/L (ref 98–111)
Creatinine, Ser: 0.79 mg/dL (ref 0.44–1.00)
GFR, Estimated: >60 mL/min (ref >60)
Glucose, Bld: 71 mg/dL (ref 70–99)
Potassium: 3.9 mmol/L (ref 3.5–5.1)
Sodium: 139 mmol/L (ref 135–145)
Total Bilirubin: 0.4 mg/dL (ref 0.0–1.2)
Total Protein: 6.4 g/dL — ABNORMAL LOW (ref 6.5–8.1)

## 2024-03-31 LAB — VALPROIC ACID LEVEL: Valproic Acid Lvl: 86 ug/mL (ref 50–100)

## 2024-03-31 MED ORDER — NITROFURANTOIN MONOHYD MACRO 100 MG PO CAPS
100.0000 mg | ORAL_CAPSULE | Freq: Two times a day (BID) | ORAL | Status: AC
  Administered 2024-03-31 – 2024-04-05 (×10): 100 mg via ORAL
  Filled 2024-03-31 (×10): qty 1

## 2024-03-31 NOTE — Plan of Care (Signed)
  Problem: Education: Goal: Knowledge of [facility] Education information/materials will improve Outcome: Progressing Goal: Emotional status will improve Outcome: Progressing Goal: Mental status will improve Outcome: Progressing Goal: Verbalization of understanding the information provided will improve Outcome: Progressing   Problem: Activity: Goal: Interest or engagement in activities will improve Outcome: Progressing Goal: Sleeping patterns will improve Outcome: Progressing   Problem: Coping: Goal: Ability to verbalize frustrations and anger appropriately will improve Outcome: Progressing Goal: Ability to demonstrate self-control will improve Outcome: Progressing   Problem: Health Behavior/Discharge Planning: Goal: Identification of resources available to assist in meeting health care needs will improve Outcome: Progressing Goal: Compliance with treatment plan for underlying cause of condition will improve Outcome: Progressing   Problem: Physical Regulation: Goal: Ability to maintain clinical measurements within normal limits will improve Outcome: Progressing   Problem: Safety: Goal: Periods of time without injury will increase Outcome: Progressing   Problem: Education: Goal: Knowledge of [place] [facility] Education information/materials will improve Outcome: Progressing Goal: Emotional status will improve Outcome: Progressing Goal: Mental status will improve Outcome: Progressing Goal: Verbalization of understanding the information provided will improve Outcome: Progressing   Problem: Activity: Goal: Interest or engagement in activities will improve Outcome: Progressing Goal: Sleeping patterns will improve Outcome: Progressing   Problem: Coping: Goal: Ability to verbalize frustrations and anger appropriately will improve Outcome: Progressing Goal: Ability to demonstrate self-control will improve Outcome: Progressing   Problem: Health  Behavior/Discharge Planning: Goal: Identification of resources available to assist in meeting health care needs will improve Outcome: Progressing Goal: Compliance with treatment plan for underlying cause of condition will improve Outcome: Progressing   Problem: Physical Regulation: Goal: Ability to maintain clinical measurements within normal limits will improve Outcome: Progressing   Problem: Safety: Goal: Periods of time without injury will increase Outcome: Progressing   Problem: Activity: Goal: Will identify at least one activity in which they can participate Outcome: Progressing   Problem: Coping: Goal: Ability to identify and develop effective coping behavior will improve Outcome: Progressing Goal: Ability to interact with others will improve Outcome: Progressing Goal: Demonstration of participation in decision-making regarding own care will improve Outcome: Progressing Goal: Ability to use eye contact when communicating with others will improve Outcome: Progressing   Problem: Health Behavior/Discharge Planning: Goal: Identification of resources available to assist in meeting health care needs will improve Outcome: Progressing   Problem: Self-Concept: Goal: Will verbalize positive feelings about self Outcome: Progressing   Problem: Activity: Goal: Will verbalize the importance of balancing activity with adequate rest periods Outcome: Progressing   Problem: Education: Goal: Will be free of psychotic symptoms Outcome: Progressing Goal: Knowledge of the prescribed therapeutic regimen will improve Outcome: Progressing   Problem: Coping: Goal: Coping ability will improve Outcome: Progressing Goal: Will verbalize feelings Outcome: Progressing   Problem: Health Behavior/Discharge Planning: Goal: Compliance with prescribed medication regimen will improve Outcome: Progressing   Problem: Nutritional: Goal: Ability to achieve adequate nutritional intake will  improve Outcome: Progressing   Problem: Role Relationship: Goal: Ability to communicate needs accurately will improve Outcome: Progressing Goal: Ability to interact with others will improve Outcome: Progressing   Problem: Safety: Goal: Ability to redirect hostility and anger into socially appropriate behaviors will improve Outcome: Progressing Goal: Ability to remain free from injury will improve Outcome: Progressing   Problem: Self-Care: Goal: Ability to participate in self-care as condition permits will improve Outcome: Progressing   Problem: Self-Concept: Goal: Will verbalize positive feelings about self Outcome: Progressing

## 2024-03-31 NOTE — Group Note (Signed)
 Recreation Therapy Group Note   Group Topic:Self-Esteem  Group Date: 03/31/2024 Start Time: 1015 End Time: 1050 Facilitators: Lizet Kelso-McCall, LRT,CTRS Location: [address] Day[address]   Group Topic: Self Esteem    Goal Area(s) Addresses:  Patient will appropriately identify what self esteem is.  Patient will create a shield of armor describing themselves.  Patient will successfully identify positive attributes about themselves.  Patient will acknowledge benefit of improved self-esteem.    Behavioral Response:   Intervention / Activity: Self-Esteem Shield. Patient attended a recreation therapy group session focused on self esteem. Patient identified what self esteem is, and why it is important to have high self esteem during group discussion. LRT were given a sheet of paper with what to put in each quadrant of the shield. Patients were also given an outline of a shield as well.  Patient was asked to create their own shield to show off their unique attributes, four quadrants reflected the following:    The Upper Left quadrant- 2 things or people you value The Upper Right quadrant- 2 lessons you have learned thus far in life The Lower Left quadrant- 3 qualities that make you unique The Lower Right quadrant- 1 goal you want to work towards   Patients were provided sheets with the shield printed on them and colored pencils, markers and crayons to complete the activity.  Patients and writer had group related discussions while individually working on their activity.    Education: Self esteem, Communication, Positive self-talk, Discharge Planning    Education Outcome: Acknowledges education/Verbalizes understanding of Education/In group clarification offered/Needs further education   Affect/Mood: N/A   Participation Level: Did not attend    Clinical Observations/Individualized Feedback:      Plan: Continue to engage patient in RT group sessions 2-3x/week.   Allison Bolton, LRT,CTRS 03/31/2024 11:54 AM

## 2024-03-31 NOTE — Progress Notes (Signed)
(  Sleep Hours) -5 (Any PRNs that were needed, meds refused, or side effects to meds)- none (Concerns raised by the patient)- pt has complaints of vaginal burning when urinating  (SI/HI/AVH)-denied

## 2024-03-31 NOTE — Plan of Care (Signed)
   Problem: Education: Goal: Emotional status will improve Outcome: Progressing Goal: Mental status will improve Outcome: Progressing Goal: Verbalization of understanding the information provided will improve Outcome: Progressing   Problem: Activity: Goal: Interest or engagement in activities will improve Outcome: Progressing

## 2024-03-31 NOTE — B[facility] Group Notes (Signed)
 Adult Psychoeducational Group Note  Date:  03/31/2024 Time:  8:31 AM  Group Topic/Focus:  Goals Group:   The focus of this group is to help patients establish daily goals to achieve during treatment and discuss how the patient can incorporate goal setting into their daily lives to aide in recovery. Orientation:   The focus of this group is to educate the patient on the purpose and policies of crisis stabilization and provide a format to answer questions about their admission.  The group details unit policies and expectations of patients while admitted.  Participation Level:  Minimal  Participation Quality:  Drowsy  Affect:  Appropriate  Cognitive:  Appropriate  Insight: Lacking  Engagement in Group:  Developing/Improving  Modes of Intervention:  Discussion  Additional Comments:  Patient attended and participated in the goals group.  Allison Bolton 03/31/2024, 8:31 AM

## 2024-03-31 NOTE — Progress Notes (Addendum)
 [facility] - [address], [place], [place] [address], [phone]  Patient continues to be on the waiting list.   Dr. Almarie Abbot [phone] from [facility] will be available to speak with a doctor from [facility] tomorrow, Friday.  She will review the documents before tomorrow.  This conversation will be regarding possible placements that [facility] may be aware of  in the [place] .   [address], [place], [place] [address], [phone]    The facility serves patient 110 years old and up.  The diagnosis has to contain dementia or Alzheimer words.     Collateral contact - care manager, Elveria Lacks [phone]   Elveria left a voicemail for CSW.  CSW called her back.   Dawood Spitler, LCSWA 03/31/2024

## 2024-03-31 NOTE — Group Note (Signed)
 Date:  03/31/2024 Time:  8:26 PM  Group Topic/Focus:  Wrap-Up Group:   The focus of this group is to help patients review their daily goal of treatment and discuss progress on daily workbooks.    Participation Level:  Minimal  Participation Quality:  Redirectable  Affect:  Lacking   Cognitive:  Lacking  Insight: Lacking  Engagement in Group:  Engaged  Modes of Intervention:  Education and Exploration  Additional Comments:  Patient attended and participated in group tonight. She appears to be confused and unable to answer questions asked. She stated today she slept and eat a lot.  Gwenn Chillington Dacosta 03/31/2024, 8:26 PM

## 2024-03-31 NOTE — Progress Notes (Signed)
   03/31/24 2100  Psych Admission Type (Psych Patients Only)  Admission Status Involuntary  Psychosocial Assessment  Patient Complaints Confusion  Eye Contact Fair  Facial Expression Fixed smile;Animated  Affect Preoccupied  Speech Soft  Interaction Childlike;Minimal  Motor Activity Slow  Appearance/Hygiene In scrubs;Disheveled  Behavior Characteristics Cooperative  Mood Pleasant;Preoccupied  Thought Process  Coherency Disorganized  Content Preoccupation  Delusions None reported or observed  Perception Derealization  Hallucination None reported or observed  Judgment Poor  Confusion Mild  Danger to Self  Current suicidal ideation? Denies  Agreement Not to Harm Self Yes  Description of Agreement Verbal  Danger to Others  Danger to Others None reported or observed

## 2024-03-31 NOTE — Progress Notes (Addendum)
 [facility] MD Progress Note  03/31/2024 10:42 AM Allison Bolton  MRN:  [id number] Subjective:   Allison Bolton is a 53 yr old female who presented on 02/17/24 to [facility] ED with a chief complaint of anxiety. In the ED, she was found to be acutely psychotic with tangential speech, agitation, and delusional thought content. Patient was admitted involuntarily to [facility] on 8/14.  PPHx is significant for Substance-Induced Psychosis, Bipolar I Disorder, Stimulant Use Disorder, GAD, Suicide Attempt (2007), and Prior Psychiatric Hospitalizations (last- [facility] 01/2024).   Case was discussed in the multidisciplinary team. MAR was reviewed and patient was compliant with medications.  She received PRN Hydroxyzine  yesterday.   Psychiatric Team made the following recommendations yesterday: -Continue Haldol  10 mg q12 for psychosis and mood stability -Continue Geodon  60 mg BID for psychosis and mood stability -Continue Depakote  DR 750 mg q12 for mood stability -Continue Ativan  0.5 mg QHS or agitation  -Continue Cogentin  1 mg BID for Drug Induced EPS     On interview today patient reports she slept good last night.  She reports her appetite is doing good.  She reports no SI, HI, or AVH.  She reports no Paranoia or Ideas of Reference.  She reports no issues with her medications.  When asked if she had any plans today she reports no and then just smiles and rolls over to go to sleep.    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 The direct patient care time included face-to-face time with the patient, reviewing the patient's chart, communicating with other  professionals, and coordinating care.    Past Psychiatric History:  Substance-Induced Psychosis, Bipolar I Disorder, Stimulant Use Disorder, GAD, Suicide Attempt (2007), and Prior Psychiatric Hospitalizations (last- [facility] 01/2024).  Past Medical History:  Past Medical History:  Diagnosis Date   Anxiety    Arthritis    Asthma    Bipolar 1 disorder (HCC)    Diverticulosis    Dysrhythmia    sts I have heart palpitations   Fibromyalgia    H/O hiatal hernia    4   Headache(784.0)    High blood pressure    Meniere's disease    S/P colonoscopy    Dr. Golda 2010: few small diverticula at sigmoid. otherwise normal.    Shortness of breath     Past Surgical History:  Procedure Laterality Date   ABDOMINAL HYSTERECTOMY     APPENDECTOMY     CESAREAN SECTION     CHOLECYSTECTOMY     COLONOSCOPY N/A 12/25/2023   Procedure: COLONOSCOPY;  Surgeon: Eartha Angelia Sieving, MD;  Location: [facility];  Service: Gastroenterology;  Laterality: N/A;  11:30am, asa 1   exploratory laparoscopy     FOOT SURGERY     HEMORRHOID SURGERY     LAPAROSCOPIC APPENDECTOMY  02/19/2011   Procedure: APPENDECTOMY LAPAROSCOPIC;  Surgeon: Oneil DELENA Budge;  Location: [facility] ORS;  Service: General;  Laterality: N/A;   LAPAROSCOPY  02/19/2011   Procedure: LAPAROSCOPY DIAGNOSTIC;  Surgeon: Oneil DELENA Budge;  Location: [facility] ORS;  Service: General;  Laterality: N/A;   left ovarian removal     multiple hernia repairs     Right ovarian removal  TONSILLECTOMY     TONSILLECTOMY AND ADENOIDECTOMY     tubes in ears     UMBILICAL HERNIA REPAIR  Dec 2011   Dr. Mavis   wisdom teeth removal     Family History:  Family History  Problem Relation Age of Onset   Thyroid  disease Mother    Colon cancer Father    Breast cancer Maternal Aunt    Colon cancer Brother    Colon cancer Other        paternal and maternal grandfather   Meniere's disease Other    Anesthesia problems Neg Hx    Hypotension Neg Hx    Malignant  hyperthermia Neg Hx    Pseudochol deficiency Neg Hx    Family Psychiatric  History:  None Reported   Social History:  Social History   Substance and Sexual Activity  Alcohol Use No   Comment: not since June 2012     Social History   Substance and Sexual Activity  Drug Use Yes   Types: Marijuana   Comment: patient denies any-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    Social History   Socioeconomic History   Marital status: Legally Separated    Spouse name: Not on file   Number of children: Not on file   Years of education: Not on file   Highest education level: Not on file  Occupational History   Not on file  Tobacco Use   Smoking status: Every Day    Current packs/day: 0.00    Average packs/day: 0.5 packs/day for 37.3 years (18.7 ttl pk-yrs)    Types: Cigarettes    Start date: 07/07/1981    Last attempt to quit: 11/05/2018    Years since quitting: 5.4   Smokeless tobacco: Never  Vaping Use   Vaping status: Never Used  Substance and Sexual Activity   Alcohol use: No    Comment: not since June 2012   Drug use: Yes    Types: Marijuana    Comment: patient denies any-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   Sexual activity: Never    Birth control/protection: Surgical  Other Topics Concern   Not on file  Social History Narrative   Not on file   Social Drivers of Health   Financial Resource Strain: Not on file  Food Insecurity: Food Insecurity Present (02/18/2024)   Hunger Vital Sign    Worried About Running Out of Food in the Last Year: Sometimes true    Ran Out of Food in the Last Year: Sometimes true  Transportation Needs: Unmet Transportation Needs (02/18/2024)   PRAPARE - Administrator, Civil Service (Medical): Yes    Lack of Transportation (Non-Medical): Yes  Physical Activity: Not on file  Stress: Not on file  Social Connections: Unknown (10/14/2021)   Received from [facility]   Social Connections    Do your friends and  family support you?: Not on file    What agencies support you?: Not on file   Additional Social History:                         Sleep: Good Estimated Sleeping Duration (Last 24 Hours): 3.00-4.25 hours  Appetite:  Good  Current Medications: Current Facility-Administered Medications  Medication Dose Route Frequency Provider Last Rate Last Admin   acetaminophen  (TYLENOL ) tablet 650 mg  650 mg Oral Q6H PRN White, Patrice L, NP   650 mg  at 03/28/24 2058   alum & mag hydroxide-simeth (MAALOX/MYLANTA) 200-200-20 MG/5ML suspension 30 mL  30 mL Oral Q4H PRN White, Patrice L, NP   30 mL at 03/24/24 0217   benztropine  (COGENTIN ) tablet 1 mg  1 mg Oral BID White, Patrice L, NP   1 mg at 03/31/24 9175   divalproex  (DEPAKOTE ) DR tablet 750 mg  750 mg Oral Q12H Ji, Andrew, MD   750 mg at 03/31/24 9175   haloperidol  (HALDOL ) tablet 10 mg  10 mg Oral Q12H Ji, Andrew, MD   10 mg at 03/31/24 9175   hydrOXYzine  (ATARAX ) tablet 25 mg  25 mg Oral TID PRN White, Patrice L, NP   25 mg at 03/29/24 2359   LORazepam  (ATIVAN ) tablet 0.5 mg  0.5 mg Oral QHS Marry Clamp, MD   0.5 mg at 03/30/24 2008   magnesium  hydroxide (MILK OF MAGNESIA) suspension 30 mL  30 mL Oral Daily PRN White, Patrice L, NP       nicotine  polacrilex (NICORETTE ) gum 2 mg  2 mg Oral PRN Trudy Carwin, NP   2 mg at 03/28/24 1716   OLANZapine  (ZYPREXA ) injection 10 mg  10 mg Intramuscular TID PRN White, Patrice L, NP       OLANZapine  (ZYPREXA ) injection 5 mg  5 mg Intramuscular TID PRN White, Patrice L, NP       OLANZapine  zydis (ZYPREXA ) disintegrating tablet 5 mg  5 mg Oral TID PRN Teresa Jes L, NP   5 mg at 03/13/24 2221   ondansetron  (ZOFRAN -ODT) disintegrating tablet 4 mg  4 mg Oral Q8H PRN Lynnette Barter, MD   4 mg at 03/30/24 0112   traZODone  (DESYREL ) tablet 50 mg  50 mg Oral QHS White, Patrice L, NP   50 mg at 03/30/24 2008   ziprasidone  (GEODON ) capsule 60 mg  60 mg Oral BID WC Marry Clamp, MD   60 mg at 03/31/24 9176     Lab Results:  Results for orders placed or performed during the hospital encounter of 02/18/24 (from the past 48 hours)  Comprehensive metabolic panel with GFR     Status: Abnormal   Collection Time: 03/31/24  6:31 AM  Result Value Ref Range   Sodium 139 135 - 145 mmol/L   Potassium 3.9 3.5 - 5.1 mmol/L   Chloride 104 98 - 111 mmol/L   CO2 24 22 - 32 mmol/L   Glucose, Bld 71 70 - 99 mg/dL    Comment: Glucose reference range applies only to samples taken after fasting for at least 8 hours.   BUN 17 6 - 20 mg/dL   Creatinine, Ser 9.20 0.44 - 1.00 mg/dL   Calcium 9.6 8.9 - 89.6 mg/dL   Total Protein 6.4 (L) 6.5 - 8.1 g/dL   Albumin 4.1 3.5 - 5.0 g/dL   AST 14 (L) 15 - 41 U/L   ALT 8 0 - 44 U/L   Alkaline Phosphatase 52 38 - 126 U/L   Total Bilirubin 0.4 0.0 - 1.2 mg/dL   GFR, Estimated >39 >39 mL/min    Comment: (NOTE) Calculated using the CKD-EPI Creatinine Equation (2021)    Anion gap 11 5 - 15    Comment: Performed at [facility], [address] [place], [place] [address]  CBC with Differential/Platelet     Status: None   Collection Time: 03/31/24  6:31 AM  Result Value Ref Range   WBC 8.4 4.0 - 10.5 K/uL   RBC 4.89 3.87 -  5.11 MIL/uL   Hemoglobin 13.8 12.0 - 15.0 g/dL   HCT 57.1 63.9 - 53.9 %   MCV 87.5 80.0 - 100.0 fL   MCH 28.2 26.0 - 34.0 pg   MCHC 32.2 30.0 - 36.0 g/dL   RDW 85.9 88.4 - 84.4 %   Platelets 185 150 - 400 K/uL   nRBC 0.0 0.0 - 0.2 %   Neutrophils Relative % 50 %   Neutro Abs 4.2 1.7 - 7.7 K/uL   Lymphocytes Relative 36 %   Lymphs Abs 3.0 0.7 - 4.0 K/uL   Monocytes Relative 9 %   Monocytes Absolute 0.8 0.1 - 1.0 K/uL   Eosinophils Relative 4 %   Eosinophils Absolute 0.4 0.0 - 0.5 K/uL   Basophils Relative 1 %   Basophils Absolute 0.1 0.0 - 0.1 K/uL   Immature Granulocytes 0 %   Abs Immature Granulocytes 0.02 0.00 - 0.07 K/uL    Comment: Performed at [facility], [address], [place], [place]  [address]  Valproic  acid level     Status: None   Collection Time: 03/31/24  6:31 AM  Result Value Ref Range   Valproic  Acid Lvl 86 50 - 100 ug/mL    Comment: Performed at [facility], [address], [place], [place] [address]     Blood Alcohol level:  Lab Results  Component Value Date   [facility] <15 02/17/2024   ETH <15 01/14/2024    Metabolic Disorder Labs: Lab Results  Component Value Date   HGBA1C 5.1 01/18/2024   MPG 99.67 01/18/2024   MPG 105.41 05/11/2019   Lab Results  Component Value Date   PROLACTIN 24.1 (H) 05/11/2019   Lab Results  Component Value Date   CHOL 154 01/18/2024   TRIG 180 (H) 01/18/2024   HDL 49 01/18/2024   CHOLHDL 3.1 01/18/2024   VLDL 36 01/18/2024   LDLCALC 69 01/18/2024   LDLCALC 82 05/11/2019    Physical Findings: AIMS:  ,  ,  ,  ,  ,  ,   CIWA:    COWS:     Musculoskeletal: Strength & Muscle Tone: within normal limits Gait & Station: normal Patient leans: N/A  Psychiatric Specialty Exam:  Presentation  General Appearance:  Disheveled  Eye Contact: Poor  Speech: -- (minimal, mainly yes and no)  Speech Volume: Normal  Handedness:No data recorded  Mood and Affect  Mood: -- (good)  Affect: Congruent   Thought Process  Thought Processes: -- (minimal)  Descriptions of Associations:Loose  Orientation:None  Thought Content:-- (minimal)  History of Schizophrenia/Schizoaffective disorder:No  Duration of Psychotic Symptoms:Greater than six months  Hallucinations:Hallucinations: None   Ideas of Reference:None  Suicidal Thoughts:Suicidal Thoughts: No   Homicidal Thoughts:Homicidal Thoughts: No    Sensorium  Memory: Immediate Poor; Recent Poor  Judgment: Impaired  Insight: Lacking   Executive Functions  Concentration: Fair  Attention Span: Fair  Recall: [employer] of Knowledge: Fair  Language: Fair   Psychomotor Activity  Psychomotor Activity:Psychomotor  Activity: Decreased    Assets  Assets: Resilience   Sleep  Sleep:Sleep: Good     Physical Exam: Physical Exam Vitals and nursing note reviewed.  Constitutional:      General: She is not in acute distress.    Appearance: She is not ill-appearing or toxic-appearing.  HENT:     Head: Normocephalic and atraumatic.  Pulmonary:     Effort: Pulmonary effort is normal.  Neurological:     Mental Status: She  is alert.    Review of Systems  Respiratory:  Negative for cough and shortness of breath.   Cardiovascular:  Negative for chest pain.  Gastrointestinal:  Negative for abdominal pain, constipation, diarrhea, nausea and vomiting.  Neurological:  Negative for dizziness, weakness and headaches.  Psychiatric/Behavioral:  Negative for depression, hallucinations and suicidal ideas. The patient is not nervous/anxious.    Blood pressure (!) 110/55, pulse 75, temperature 97.8 F (36.6 C), temperature source Oral, resp. rate 16, height 5' 6 (1.676 m), weight 80.7 kg, SpO2 95%. Body mass index is 28.71 kg/m.   Treatment Plan Summary: Daily contact with patient to assess and evaluate symptoms and progress in treatment and Medication management  MARIAFERNANDA HENDRICKSEN is a 53 yr old female who presented on 02/17/24 to [facility] ED with a chief complaint of anxiety. In the ED, she was found to be acutely psychotic with tangential speech, agitation, and delusional thought content. Patient was admitted involuntarily to [facility] on 8/14.  PPHx is significant for Substance-Induced Psychosis, Bipolar I Disorder, Stimulant Use Disorder, GAD, Suicide Attempt (2007), and Prior Psychiatric Hospitalizations (last- [facility] 01/2024).   She continues to be minimally responsive.  She continues to need assistance with ADL's.  Her lab work shows no electrolyte abnormalities, kidney or liver issues.  Her Depakote  remains within the therapeutic window at 86.  We will not make any changes to her medications at this  time.  We will continue to monitor.    Substance-induced psychosis  Bipolar disorder: -Continue Haldol  10 mg q12 for psychosis and mood stability -Continue Geodon  60 mg BID for psychosis and mood stability -Continue Depakote  DR 750 mg q12 for mood stability -Continue Ativan  0.5 mg QHS or agitation  -Continue Cogentin  1 mg BID for Drug Induced EPS -Continue Agitation Protocol: Zyprexa    Dementia in other diseases classified elsewhere, unspecified severity, without behavioral disturbance, psychotic disturbance, mood disturbance, and anxiety: Babbette was given a MOCA on 9/17 and scored an 8.  [facility] SW is seeking a guardian for the patient as she needs 24 hour assistance at this point and they are looking for placement at either SNF or group home.     Nicotine  Dependence: -Continue Nicotine  Gum 2 mg PRN   -Continue Zofran  4 mg q8 PRN nausea/vomiting -Continue PRN's: Tylenol , Maalox, Atarax , Milk of Magnesia, Trazodone    --  The risks/benefits/side-effects/alternatives to medications were discussed in detail with the patient and time was given for questions. The patient consents to medication trials.                -- Metabolic profile and EKG monitoring obtained while on an atypical antipsychotic (BMI: 28.73 Lipid Panel: WNL except Trig: 180 HbgA1c: 5.1)              -- Encouraged patient to participate in unit milieu and in scheduled group therapies              -- Short Term Goals: Ability to identify changes in lifestyle to reduce recurrence of condition will improve, Ability to verbalize feelings will improve, Ability to disclose and discuss suicidal ideas, Ability to demonstrate self-control will improve, Ability to identify and develop effective coping behaviors will improve, Ability to maintain clinical measurements within normal limits will improve, Compliance with prescribed medications will improve, and Ability to identify triggers associated with substance abuse/mental health issues  will improve             -- Long Term Goals: Improvement in symptoms so as  ready for discharge   Safety and Monitoring:             -- Involuntary admission to inpatient psychiatric unit for safety, stabilization and treatment             -- Daily contact with patient to assess and evaluate symptoms and progress in treatment             -- Patient's case to be discussed in multi-disciplinary team meeting             -- Observation Level : q15 minute checks             -- Vital signs:  q12 hours             -- Precautions: suicide, elopement, and assault  Discharge Planning:              -- Social work and case management to assist with discharge planning and identification of hospital follow-up needs prior to discharge             -- Estimated LOS: 7+ more days             -- Discharge Concerns: Need to establish a safety plan; Medication compliance and effectiveness             -- Discharge Goals: Return home with outpatient referrals for mental health follow-up including medication management/psychotherapy   Marsa GORMAN Rosser, DO 03/31/2024, 10:42 AM

## 2024-03-31 NOTE — Progress Notes (Signed)
   03/31/24 0900  Psych Admission Type (Psych Patients Only)  Admission Status Involuntary  Psychosocial Assessment  Patient Complaints Confusion  Eye Contact Fair  Facial Expression Fixed smile;Animated  Affect Preoccupied  Speech Soft  Interaction Childlike;Minimal  Motor Activity Slow  Appearance/Hygiene Disheveled  Behavior Characteristics Cooperative  Mood Pleasant;Preoccupied  Thought Process  Coherency Disorganized  Content Preoccupation  Delusions None reported or observed  Perception Derealization  Hallucination None reported or observed  Judgment Poor  Confusion Mild  Danger to Self  Current suicidal ideation? Denies  Agreement Not to Harm Self Yes  Description of Agreement verbal  Danger to Others  Danger to Others None reported or observed

## 2024-04-01 ENCOUNTER — Encounter (HOSPITAL_COMMUNITY): Payer: Self-pay

## 2024-04-01 DIAGNOSIS — F19959 Other psychoactive substance use, unspecified with psychoactive substance-induced psychotic disorder, unspecified: Secondary | ICD-10-CM | POA: Diagnosis not present

## 2024-04-01 DIAGNOSIS — F0280 Dementia in other diseases classified elsewhere without behavioral disturbance: Secondary | ICD-10-CM | POA: Diagnosis not present

## 2024-04-01 DIAGNOSIS — N390 Urinary tract infection, site not specified: Secondary | ICD-10-CM | POA: Diagnosis not present

## 2024-04-01 DIAGNOSIS — F312 Bipolar disorder, current episode manic severe with psychotic features: Secondary | ICD-10-CM | POA: Diagnosis not present

## 2024-04-01 LAB — URINALYSIS, W/ REFLEX TO CULTURE (INFECTION SUSPECTED)
Bilirubin Urine: NEGATIVE
Glucose, UA: NEGATIVE mg/dL
Hgb urine dipstick: NEGATIVE
Ketones, ur: NEGATIVE mg/dL
Nitrite: NEGATIVE
Protein, ur: 30 mg/dL — AB
Specific Gravity, Urine: 1.016 (ref 1.005–1.030)
WBC, UA: >50 WBC/hpf (ref 0–5)
pH: 6 (ref 5.0–8.0)

## 2024-04-01 NOTE — Plan of Care (Signed)
  Problem: Education: Goal: Knowledge of [facility] Education information/materials will improve Outcome: Progressing Goal: Emotional status will improve Outcome: Progressing Goal: Mental status will improve Outcome: Progressing Goal: Verbalization of understanding the information provided will improve Outcome: Progressing   Problem: Activity: Goal: Interest or engagement in activities will improve Outcome: Progressing Goal: Sleeping patterns will improve Outcome: Progressing   Problem: Coping: Goal: Ability to verbalize frustrations and anger appropriately will improve Outcome: Progressing Goal: Ability to demonstrate self-control will improve Outcome: Progressing   Problem: Health Behavior/Discharge Planning: Goal: Identification of resources available to assist in meeting health care needs will improve Outcome: Progressing Goal: Compliance with treatment plan for underlying cause of condition will improve Outcome: Progressing   Problem: Physical Regulation: Goal: Ability to maintain clinical measurements within normal limits will improve Outcome: Progressing   Problem: Safety: Goal: Periods of time without injury will increase Outcome: Progressing   Problem: Education: Goal: Knowledge of [place] [facility] Education information/materials will improve Outcome: Progressing Goal: Emotional status will improve Outcome: Progressing Goal: Mental status will improve Outcome: Progressing Goal: Verbalization of understanding the information provided will improve Outcome: Progressing   Problem: Activity: Goal: Interest or engagement in activities will improve Outcome: Progressing Goal: Sleeping patterns will improve Outcome: Progressing   Problem: Coping: Goal: Ability to verbalize frustrations and anger appropriately will improve Outcome: Progressing Goal: Ability to demonstrate self-control will improve Outcome: Progressing   Problem: Health  Behavior/Discharge Planning: Goal: Identification of resources available to assist in meeting health care needs will improve Outcome: Progressing Goal: Compliance with treatment plan for underlying cause of condition will improve Outcome: Progressing   Problem: Physical Regulation: Goal: Ability to maintain clinical measurements within normal limits will improve Outcome: Progressing   Problem: Safety: Goal: Periods of time without injury will increase Outcome: Progressing   Problem: Activity: Goal: Will identify at least one activity in which they can participate Outcome: Progressing   Problem: Coping: Goal: Ability to identify and develop effective coping behavior will improve Outcome: Progressing Goal: Ability to interact with others will improve Outcome: Progressing Goal: Demonstration of participation in decision-making regarding own care will improve Outcome: Progressing Goal: Ability to use eye contact when communicating with others will improve Outcome: Progressing   Problem: Health Behavior/Discharge Planning: Goal: Identification of resources available to assist in meeting health care needs will improve Outcome: Progressing   Problem: Self-Concept: Goal: Will verbalize positive feelings about self Outcome: Progressing   Problem: Activity: Goal: Will verbalize the importance of balancing activity with adequate rest periods Outcome: Progressing   Problem: Education: Goal: Will be free of psychotic symptoms Outcome: Progressing Goal: Knowledge of the prescribed therapeutic regimen will improve Outcome: Progressing   Problem: Coping: Goal: Coping ability will improve Outcome: Progressing Goal: Will verbalize feelings Outcome: Progressing   Problem: Health Behavior/Discharge Planning: Goal: Compliance with prescribed medication regimen will improve Outcome: Progressing   Problem: Nutritional: Goal: Ability to achieve adequate nutritional intake will  improve Outcome: Progressing   Problem: Role Relationship: Goal: Ability to communicate needs accurately will improve Outcome: Progressing Goal: Ability to interact with others will improve Outcome: Progressing   Problem: Safety: Goal: Ability to redirect hostility and anger into socially appropriate behaviors will improve Outcome: Progressing Goal: Ability to remain free from injury will improve Outcome: Progressing   Problem: Self-Care: Goal: Ability to participate in self-care as condition permits will improve Outcome: Progressing   Problem: Self-Concept: Goal: Will verbalize positive feelings about self Outcome: Progressing

## 2024-04-01 NOTE — Group Note (Signed)
 Recreation Therapy Group Note   Group Topic:Leisure Education  Group Date: 04/01/2024 Start Time: 1010 End Time: 1050 Facilitators: Dreux Mcgroarty-McCall, LRT,CTRS Location: [address] Dayroom   Group Topic: Leisure Education  Goal Area(s) Addresses:  Patient will identify positive leisure activities.  Patient will identify one positive benefit of participation in leisure activities.   Behavioral Response: Active  Intervention: Leisure Group Game  Activity: Patient, MHT, and LRT participated in playing a trivia game of Guess the [place]. Participants took turns flicking the spinner. Whatever category (R&B, Hip Hop, Pop, Indie, Dance and Rock) the spinner landed on, the person would be read the lyrics and the person would have guess the missing lyric. The person with the most cards at the end of game won.  Education:  Leisure Exposure, Pharmacist, community, Discharge Planning  Education Outcome: Acknowledges education/In group clarification offered/Needs additional education   Affect/Mood: Congruent   Participation Level: Active   Participation Quality: Independent   Behavior: Cooperative   Speech/Thought Process: Rational   Insight: Impaired   Judgement: Impaired   Modes of Intervention: Competitive Play   Patient Response to Interventions:  Receptive   Education Outcome:  In group clarification offered    Clinical Observations/Individualized Feedback: Pt was bright and smiling during group. Pt made attempts to answer the questions but would be way off topic. Pt was active and appeared to be enjoying herself.      Plan: Continue to engage patient in RT group sessions 2-3x/week.   Brynnlie Unterreiner-McCall, LRT,CTRS 04/01/2024 1:12 PM

## 2024-04-01 NOTE — Progress Notes (Signed)
(  Sleep Hours) - 7.75  (Any PRNs that were needed, meds refused, or side effects to meds)- n/a  (Any disturbances and when (visitation, over night)- n/a  (Concerns raised by the patient)- n/a   (SI/HI/AVH)- denies

## 2024-04-01 NOTE — B[facility] Group Notes (Signed)
 Adult Psychoeducational Group Note  Date:  04/01/2024 Time:  7:34 PM  Group Topic/Focus:  Goals Group:   The focus of this group is to help patients establish daily goals to achieve during treatment and discuss how the patient can incorporate goal setting into their daily lives to aide in recovery. Orientation:   The focus of this group is to educate the patient on the purpose and policies of crisis stabilization and provide a format to answer questions about their admission.  The group details unit policies and expectations of patients while admitted.  Participation Level:  Did Not Attend  Participation Quality:    Affect:    Cognitive:    Insight:   Engagement in Group:    Modes of Intervention:    Additional Comments:    Onnika Siebel O 04/01/2024, 7:34 PM

## 2024-04-01 NOTE — Plan of Care (Signed)
 Pt visible in milieu majority of this shift. Remains animated, fidgety and confused but is pleasant, cooperative with care and follows commands. Attended scheduled groups in dayroom with prompts. Continues to need redirections and assistance related to personal hygiene goals. Reports Allison Bolton slept well last night I like my medicines because they makes me sleep. Tolerates meals and fluids well. Remains medication compliant. Emotional support throughout this shift. Pt's concerns validated, encouragement and reassurance offered. Safety checks maintained at Q 15 minutes intervals.   Problem: Activity: Goal: Interest or engagement in activities will improve Outcome: Progressing   Problem: Health Behavior/Discharge Planning: Goal: Compliance with treatment plan for underlying cause of condition will improve Outcome: Progressing   Problem: Safety: Goal: Periods of time without injury will increase Outcome: Progressing

## 2024-04-01 NOTE — Group Note (Signed)
 Date:  04/01/2024 Time:  8:35 PM  Group Topic/Focus:  Wrap-Up Group:   The focus of this group is to help patients review their daily goal of treatment and discuss progress on daily workbooks.    Participation Level:  Did Not Attend  Reiss Mowrey Dacosta 04/01/2024, 8:35 PM

## 2024-04-01 NOTE — Progress Notes (Addendum)
 [facility] MD Progress Note  04/01/2024 3:37 PM KRISTILYN COLTRANE  MRN:  [id number] Subjective:   Allison Bolton is a 53 yr old female who presented on 02/17/24 to [facility] ED with a chief complaint of anxiety. In the ED, she was found to be acutely psychotic with tangential speech, agitation, and delusional thought content. Patient was admitted involuntarily to [facility] on 8/14.  PPHx is significant for Substance-Induced Psychosis, Bipolar I Disorder, Stimulant Use Disorder, GAD, Suicide Attempt (2007), and Prior Psychiatric Hospitalizations (last- [facility] 01/2024).   Case was discussed in the multidisciplinary team. MAR was reviewed and patient was compliant with medications.  She received PRN Tylenol  yesterday.   Psychiatric Team made the following recommendations yesterday: -Continue Haldol  10 mg q12 for psychosis and mood stability -Continue Geodon  60 mg BID for psychosis and mood stability -Continue Depakote  DR 750 mg q12 for mood stability -Continue Ativan  0.5 mg QHS or agitation  -Continue Cogentin  1 mg BID for Drug Induced EPS    On interview today patient reports she slept good last night.  She reports her appetite is doing good.  She reports no SI, HI, or AVH.  She reports no Paranoia or Ideas of Reference.  She reports no issues with her medications.  When asked if she was going out she just nodded and then walked off.  She reports no other concerns at present.   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 The direct patient care time included face-to-face time with the patient, reviewing the patient's chart, communicating with other  professionals, and coordinating care.    Past Psychiatric History:  Substance-Induced Psychosis, Bipolar I Disorder, Stimulant Use Disorder, GAD, Suicide Attempt (2007), and Prior Psychiatric Hospitalizations (last- [facility] 01/2024).  Past Medical History:  Past Medical History:  Diagnosis Date   Anxiety    Arthritis    Asthma    Bipolar 1 disorder (HCC)    Diverticulosis    Dysrhythmia    sts I have heart palpitations   Fibromyalgia    H/O hiatal hernia    4   Headache(784.0)    High blood pressure    Meniere's disease    S/P colonoscopy    Dr. Golda 2010: few small diverticula at sigmoid. otherwise normal.    Shortness of breath     Past Surgical History:  Procedure Laterality Date   ABDOMINAL HYSTERECTOMY     APPENDECTOMY     CESAREAN SECTION     CHOLECYSTECTOMY     COLONOSCOPY N/A 12/25/2023   Procedure: COLONOSCOPY;  Surgeon: Eartha Angelia Sieving, MD;  Location: [facility];  Service: Gastroenterology;  Laterality: N/A;  11:30am, asa 1   exploratory laparoscopy     FOOT SURGERY     HEMORRHOID SURGERY     LAPAROSCOPIC APPENDECTOMY  02/19/2011   Procedure: APPENDECTOMY LAPAROSCOPIC;  Surgeon: Oneil DELENA Budge;  Location: [facility] OR[facility];  Service: General;  Laterality: N/A;   LAPAROSCOPY  02/19/2011   Procedure: LAPAROSCOPY DIAGNOSTIC;  Surgeon: Oneil DELENA Budge;  Location: [facility] ORS;  Service: General;  Laterality: N/A;   left ovarian removal     multiple hernia repairs     Right ovarian removal     TONSILLECTOMY  TONSILLECTOMY AND ADENOIDECTOMY     tubes in ears     UMBILICAL HERNIA REPAIR  Dec 2011   Dr. Mavis   wisdom teeth removal     Family History:  Family History  Problem Relation Age of Onset   Thyroid  disease Mother    Colon cancer Father    Breast cancer Maternal Aunt    Colon cancer Brother    Colon cancer Other        paternal and maternal grandfather   Meniere's disease Other    Anesthesia problems Neg Hx    Hypotension Neg Hx    Malignant  hyperthermia Neg Hx    Pseudochol deficiency Neg Hx    Family Psychiatric  History:  None Reported   Social History:  Social History   Substance and Sexual Activity  Alcohol Use No   Comment: not since June 2012     Social History   Substance and Sexual Activity  Drug Use Yes   Types: Marijuana   Comment: patient denies any-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    Social History   Socioeconomic History   Marital status: Legally Separated    Spouse name: Not on file   Number of children: Not on file   Years of education: Not on file   Highest education level: Not on file  Occupational History   Not on file  Tobacco Use   Smoking status: Every Day    Current packs/day: 0.00    Average packs/day: 0.5 packs/day for 37.3 years (18.7 ttl pk-yrs)    Types: Cigarettes    Start date: 07/07/1981    Last attempt to quit: 11/05/2018    Years since quitting: 5.4   Smokeless tobacco: Never  Vaping Use   Vaping status: Never Used  Substance and Sexual Activity   Alcohol use: No    Comment: not since June 2012   Drug use: Yes    Types: Marijuana    Comment: patient denies any-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   Sexual activity: Never    Birth control/protection: Surgical  Other Topics Concern   Not on file  Social History Narrative   Not on file   Social Drivers of Health   Financial Resource Strain: Not on file  Food Insecurity: Food Insecurity Present (02/18/2024)   Hunger Vital Sign    Worried About Running Out of Food in the Last Year: Sometimes true    Ran Out of Food in the Last Year: Sometimes true  Transportation Needs: Unmet Transportation Needs (02/18/2024)   PRAPARE - Administrator, Civil Service (Medical): Yes    Lack of Transportation (Non-Medical): Yes  Physical Activity: Not on file  Stress: Not on file  Social Connections: Unknown (10/14/2021)   Received from [facility]   Social Connections    Do your friends and  family support you?: Not on file    What agencies support you?: Not on file   Additional Social History:                         Sleep: Good Estimated Sleeping Duration (Last 24 Hours): 6.50-8.25 hours  Appetite:  Good  Current Medications: Current Facility-Administered Medications  Medication Dose Route Frequency Provider Last Rate Last Admin   acetaminophen  (TYLENOL ) tablet 650 mg  650 mg Oral Q6H PRN White, Patrice L, NP   650 mg at 03/31/24 1342  alum & mag hydroxide-simeth (MAALOX/MYLANTA) 200-200-20 MG/5ML suspension 30 mL  30 mL Oral Q4H PRN White, Patrice L, NP   30 mL at 03/24/24 0217   benztropine  (COGENTIN ) tablet 1 mg  1 mg Oral BID White, Patrice L, NP   1 mg at 04/01/24 0843   divalproex  (DEPAKOTE ) DR tablet 750 mg  750 mg Oral Q12H Ji, Andrew, MD   750 mg at 04/01/24 9156   haloperidol  (HALDOL ) tablet 10 mg  10 mg Oral Q12H Ji, Andrew, MD   10 mg at 04/01/24 9156   hydrOXYzine  (ATARAX ) tablet 25 mg  25 mg Oral TID PRN White, Patrice L, NP   25 mg at 03/29/24 2359   LORazepam  (ATIVAN ) tablet 0.5 mg  0.5 mg Oral QHS Marry Clamp, MD   0.5 mg at 03/31/24 2023   magnesium  hydroxide (MILK OF MAGNESIA) suspension 30 mL  30 mL Oral Daily PRN White, Patrice L, NP       nicotine  polacrilex (NICORETTE ) gum 2 mg  2 mg Oral PRN Trudy Carwin, NP   2 mg at 03/28/24 1716   nitrofurantoin  (macrocrystal-monohydrate) (MACROBID ) capsule 100 mg  100 mg Oral Q12H Kadie Balestrieri S, DO   100 mg at 04/01/24 9155   OLANZapine  (ZYPREXA ) injection 10 mg  10 mg Intramuscular TID PRN White, Patrice L, NP       OLANZapine  (ZYPREXA ) injection 5 mg  5 mg Intramuscular TID PRN White, Patrice L, NP       OLANZapine  zydis (ZYPREXA ) disintegrating tablet 5 mg  5 mg Oral TID PRN Teresa Jes L, NP   5 mg at 03/13/24 2221   ondansetron  (ZOFRAN -ODT) disintegrating tablet 4 mg  4 mg Oral Q8H PRN Lynnette Barter, MD   4 mg at 03/30/24 0112   traZODone  (DESYREL ) tablet 50 mg  50 mg Oral QHS White,  Patrice L, NP   50 mg at 03/31/24 2023   ziprasidone  (GEODON ) capsule 60 mg  60 mg Oral BID WC Marry Clamp, MD   60 mg at 04/01/24 9156    Lab Results:  Results for orders placed or performed during the hospital encounter of 02/18/24 (from the past 48 hours)  Comprehensive metabolic panel with GFR     Status: Abnormal   Collection Time: 03/31/24  6:31 AM  Result Value Ref Range   Sodium 139 135 - 145 mmol/L   Potassium 3.9 3.5 - 5.1 mmol/L   Chloride 104 98 - 111 mmol/L   CO2 24 22 - 32 mmol/L   Glucose, Bld 71 70 - 99 mg/dL    Comment: Glucose reference range applies only to samples taken after fasting for at least 8 hours.   BUN 17 6 - 20 mg/dL   Creatinine, Ser 9.20 0.44 - 1.00 mg/dL   Calcium 9.6 8.9 - 89.6 mg/dL   Total Protein 6.4 (L) 6.5 - 8.1 g/dL   Albumin 4.1 3.5 - 5.0 g/dL   AST 14 (L) 15 - 41 U/L   ALT 8 0 - 44 U/L   Alkaline Phosphatase 52 38 - 126 U/L   Total Bilirubin 0.4 0.0 - 1.2 mg/dL   GFR, Estimated >39 >39 mL/min    Comment: (NOTE) Calculated using the CKD-EPI Creatinine Equation (2021)    Anion gap 11 5 - 15    Comment: Performed at [facility], [address] [place], [place] [address]  CBC with Differential/Platelet     Status: None   Collection Time: 03/31/24  6:31 AM  Result Value Ref Range   WBC 8.4 4.0 - 10.5 K/uL   RBC 4.89 3.87 - 5.11 MIL/uL   Hemoglobin 13.8 12.0 - 15.0 g/dL   HCT 57.1 63.9 - 53.9 %   MCV 87.5 80.0 - 100.0 fL   MCH 28.2 26.0 - 34.0 pg   MCHC 32.2 30.0 - 36.0 g/dL   RDW 85.9 88.4 - 84.4 %   Platelets 185 150 - 400 K/uL   nRBC 0.0 0.0 - 0.2 %   Neutrophils Relative % 50 %   Neutro Abs 4.2 1.7 - 7.7 K/uL   Lymphocytes Relative 36 %   Lymphs Abs 3.0 0.7 - 4.0 K/uL   Monocytes Relative 9 %   Monocytes Absolute 0.8 0.1 - 1.0 K/uL   Eosinophils Relative 4 %   Eosinophils Absolute 0.4 0.0 - 0.5 K/uL   Basophils Relative 1 %   Basophils Absolute 0.1 0.0 - 0.1 K/uL   Immature Granulocytes 0 %   Abs  Immature Granulocytes 0.02 0.00 - 0.07 K/uL    Comment: Performed at [facility], [address], [place], [place] [address]  Valproic  acid level     Status: None   Collection Time: 03/31/24  6:31 AM  Result Value Ref Range   Valproic  Acid Lvl 86 50 - 100 ug/mL    Comment: Performed at [facility], [address], [place], [place] [address]  Urinalysis, w/ Reflex to Culture (Infection Suspected) -Urine, Random     Status: Abnormal   Collection Time: 03/31/24  6:59 PM  Result Value Ref Range   Specimen Source URINE, RANDOM    Color, Urine YELLOW YELLOW   APPearance CLOUDY (A) CLEAR   Specific Gravity, Urine 1.016 1.005 - 1.030   pH 6.0 5.0 - 8.0   Glucose, UA NEGATIVE NEGATIVE mg/dL   Hgb urine dipstick NEGATIVE NEGATIVE   Bilirubin Urine NEGATIVE NEGATIVE   Ketones, ur NEGATIVE NEGATIVE mg/dL   Protein, ur 30 (A) NEGATIVE mg/dL   Nitrite NEGATIVE NEGATIVE   Leukocytes,Ua LARGE (A) NEGATIVE   RBC / HPF 0-5 0 - 5 RBC/hpf   WBC, UA >50 0 - 5 WBC/hpf    Comment:        Reflex urine culture not performed if WBC <=10, OR if Squamous epithelial cells >5. If Squamous epithelial cells >5 suggest recollection.    Bacteria, UA MANY (A) NONE SEEN   Squamous Epithelial / HPF 0-5 0 - 5 /HPF   Mucus PRESENT     Comment: Performed at [facility], [address], [place], [place] [address]     Blood Alcohol level:  Lab Results  Component Value Date   [facility] <15 02/17/2024   ETH <15 01/14/2024    Metabolic Disorder Labs: Lab Results  Component Value Date   HGBA1C 5.1 01/18/2024   MPG 99.67 01/18/2024   MPG 105.41 05/11/2019   Lab Results  Component Value Date   PROLACTIN 24.1 (H) 05/11/2019   Lab Results  Component Value Date   CHOL 154 01/18/2024   TRIG 180 (H) 01/18/2024   HDL 49 01/18/2024   CHOLHDL 3.1 01/18/2024   VLDL 36 01/18/2024   LDLCALC 69 01/18/2024   LDLCALC 82 05/11/2019    Physical Findings: AIMS:   ,  ,  ,  ,  ,  ,   CIWA:    COWS:     Musculoskeletal: Strength & Muscle Tone: within normal limits Gait & Station: normal Patient leans:  N/A  Psychiatric Specialty Exam:  Presentation  General Appearance:  Disheveled  Eye Contact: Fair  Speech: -- (minimal, yes/no)  Speech Volume: Normal  Handedness:No data recorded  Mood and Affect  Mood: -- (fine)  Affect: Congruent   Thought Process  Thought Processes: -- (minimal)  Descriptions of Associations:Loose  Orientation:None  Thought Content:-- (minimal)  History of Schizophrenia/Schizoaffective disorder:No  Duration of Psychotic Symptoms:Greater than six months  Hallucinations:Hallucinations: None   Ideas of Reference:None  Suicidal Thoughts:Suicidal Thoughts: No   Homicidal Thoughts:Homicidal Thoughts: No    Sensorium  Memory: Immediate Poor; Recent Poor  Judgment: Impaired  Insight: Lacking   Executive Functions  Concentration: Fair  Attention Span: Fair  Recall: Poor  Fund of Knowledge: Poor  Language: Poor   Psychomotor Activity  Psychomotor Activity:Psychomotor Activity: Decreased    Assets  Assets: Resilience   Sleep  Sleep:Sleep: Good     Physical Exam: Physical Exam Vitals and nursing note reviewed.  Constitutional:      General: She is not in acute distress.    Appearance: She is not toxic-appearing.  HENT:     Head: Normocephalic and atraumatic.  Pulmonary:     Effort: Pulmonary effort is normal.  Neurological:     Mental Status: She is alert.    Review of Systems  Respiratory:  Negative for cough and shortness of breath.   Cardiovascular:  Negative for chest pain.  Gastrointestinal:  Negative for abdominal pain, constipation, diarrhea, nausea and vomiting.  Neurological:  Negative for dizziness, weakness and headaches.  Psychiatric/Behavioral:  Negative for depression, hallucinations and suicidal ideas. The patient is not  nervous/anxious.    Blood pressure 126/72, pulse 97, temperature 97.8 F (36.6 C), temperature source Oral, resp. rate 16, height 5' 6 (1.676 m), weight 80.7 kg, SpO2 95%. Body mass index is 28.71 kg/m.   Treatment Plan Summary: Daily contact with patient to assess and evaluate symptoms and progress in treatment and Medication management  Allison Bolton is a 53 yr old female who presented on 02/17/24 to [facility] ED with a chief complaint of anxiety. In the ED, she was found to be acutely psychotic with tangential speech, agitation, and delusional thought content. Patient was admitted involuntarily to [facility] on 8/14.  PPHx is significant for Substance-Induced Psychosis, Bipolar I Disorder, Stimulant Use Disorder, GAD, Suicide Attempt (2007), and Prior Psychiatric Hospitalizations (last- [facility] 01/2024).   She is more active and went outside.  She continues to not eat or attend to her needs without significant prompting.  We will not make any changes to her medications at this time.  We will continue to monitor.  Update: Last night staff reported patient was reporting urinary symptoms and so a UDS was ordered and she was started on empirical Nitrofurantoin  for 5 days while awaiting urine culture.    Substance-induced psychosis  Bipolar disorder: -Continue Haldol  10 mg q12 for psychosis and mood stability -Continue Geodon  60 mg BID for psychosis and mood stability -Continue Depakote  DR 750 mg q12 for mood stability -Continue Ativan  0.5 mg QHS or agitation  -Continue Cogentin  1 mg BID for Drug Induced EPS -Continue Agitation Protocol: Zyprexa    Dementia in other diseases classified elsewhere, unspecified severity, without behavioral disturbance, psychotic disturbance, mood disturbance, and anxiety: Daleisa was given a MOCA on 9/17 and scored an 8.  [facility] SW is seeking a guardian for the patient as she needs 24 hour assistance at this point and they are looking for placement at either SNF or  group home.     Nicotine  Dependence: -Continue Nicotine  Gum 2 mg PRN   Presumed UTI: -Continue Nitrofurantoin  100 mg BID for 5 days (Day 1 of 5)   -Continue Zofran  4 mg q8 PRN nausea/vomiting -Continue PRN's: Tylenol , Maalox, Atarax , Milk of Magnesia, Trazodone    --  The risks/benefits/side-effects/alternatives to medications were discussed in detail with the patient and time was given for questions. The patient consents to medication trials.                -- Metabolic profile and EKG monitoring obtained while on an atypical antipsychotic (BMI: 28.73 Lipid Panel: WNL except Trig: 180 HbgA1c: 5.1)              -- Encouraged patient to participate in unit milieu and in scheduled group therapies              -- Short Term Goals: Ability to identify changes in lifestyle to reduce recurrence of condition will improve, Ability to verbalize feelings will improve, Ability to disclose and discuss suicidal ideas, Ability to demonstrate self-control will improve, Ability to identify and develop effective coping behaviors will improve, Ability to maintain clinical measurements within normal limits will improve, Compliance with prescribed medications will improve, and Ability to identify triggers associated with substance abuse/mental health issues will improve             -- Long Term Goals: Improvement in symptoms so as ready for discharge   Safety and Monitoring:             -- Involuntary admission to inpatient psychiatric unit for safety, stabilization and treatment             -- Daily contact with patient to assess and evaluate symptoms and progress in treatment             -- Patient's case to be discussed in multi-disciplinary team meeting             -- Observation Level : q15 minute checks             -- Vital signs:  q12 hours             -- Precautions: suicide, elopement, and assault  Discharge Planning:              -- Social work and case management to assist with discharge  planning and identification of hospital follow-up needs prior to discharge             -- Estimated LOS: 7+ more days             -- Discharge Concerns: Need to establish a safety plan; Medication compliance and effectiveness             -- Discharge Goals: Return home with outpatient referrals for mental health follow-up including medication management/psychotherapy   Marsa GORMAN Rosser, DO 04/01/2024, 3:37 PM
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
## 2024-04-01 NOTE — [facility] Group Notes (Signed)
 Spirituality Group   Focus of discussion: Gratitude and Strength Awareness   Process: Following theoretical framework of group therapy of Irvin Yalom and further informed by Rogerian and Relational Cultural Theory approaches, participants invited to name:   Sources of gratitude (internal>external)   Articulate gratitude for self   Name a personal strength/gift/skill   Locate points of resonance among group members/engage the here and now   Conclude with grounding/breathwork   Observations: Allison Bolton was able to join group for about 0.25 of the hour. She named her daughter/granddaughters (?) as source of gratitude, her angels.  Marquin Patino L. Delores HERO.Div

## 2024-04-02 DIAGNOSIS — F19959 Other psychoactive substance use, unspecified with psychoactive substance-induced psychotic disorder, unspecified: Secondary | ICD-10-CM | POA: Diagnosis not present

## 2024-04-02 DIAGNOSIS — F0280 Dementia in other diseases classified elsewhere without behavioral disturbance: Secondary | ICD-10-CM | POA: Diagnosis not present

## 2024-04-02 DIAGNOSIS — F312 Bipolar disorder, current episode manic severe with psychotic features: Secondary | ICD-10-CM | POA: Diagnosis not present

## 2024-04-02 DIAGNOSIS — N390 Urinary tract infection, site not specified: Secondary | ICD-10-CM | POA: Diagnosis not present

## 2024-04-02 NOTE — Group Note (Deleted)
 LCSW Group Therapy Note   Group Date: 04/02/2024 Start Time: 1040 End Time: 1140   Type of Therapy and Topic:  Group Therapy: Challenging Core Beliefs  Participation Level:  {[facility] PARTICIPATION OZCZO:[id number]}  Description of Group:  Patients were educated about core beliefs and asked to identify one harmful core belief that they have. Patients were asked to explore from where those beliefs originate. Patients were asked to discuss how those beliefs make them feel and the resulting behaviors of those beliefs. They were then be asked if those beliefs are true and, if so, what evidence they have to support them. Lastly, group members were challenged to replace those negative core beliefs with helpful beliefs.   Therapeutic Goals:   1. Patient will identify harmful core beliefs and explore the origins of such beliefs. 2. Patient will identify feelings and behaviors that result from those core beliefs. 3. Patient will discuss whether such beliefs are true. 4.  Patient will replace harmful core beliefs with helpful ones.  Summary of Patient Progress:  *** actively engaged in processing and exploring how core beliefs are formed and how they impact thoughts, feelings, and behaviors. Patient proved open to input from peers and feedback from CSW. Patient demonstrated *** insight into the subject matter, was respectful and supportive of peers, and participated throughout the entire session.  Therapeutic Modalities: Cognitive Behavioral Therapy; Solution-Focused Therapy   Hunter JONELLE Lever, ISRAEL 04/02/2024  1:18 PM

## 2024-04-02 NOTE — Group Note (Signed)
 Date:  04/02/2024 Time:  8:54 PM  Group Topic/Focus:  Wrap-Up Group:   The focus of this group is to help patients review their daily goal of treatment and discuss progress on daily workbooks.    Participation Level:  Did Not Attend   Allison Bolton 04/02/2024, 8:54 PM

## 2024-04-02 NOTE — Progress Notes (Signed)
   04/02/24 1957  Psych Admission Type (Psych Patients Only)  Admission Status Involuntary  Psychosocial Assessment  Patient Complaints Confusion  Eye Contact Fair  Facial Expression Animated  Affect Preoccupied  Speech Soft  Interaction Childlike  Motor Activity Slow  Appearance/Hygiene In scrubs  Behavior Characteristics Cooperative  Mood Pleasant;Preoccupied  Aggressive Behavior  Effect No apparent injury  Thought Process  Coherency Disorganized  Content Preoccupation  Delusions None reported or observed  Perception Derealization  Hallucination None reported or observed  Judgment Poor  Confusion Mild  Danger to Self  Current suicidal ideation? Denies  Danger to Others  Danger to Others None reported or observed

## 2024-04-02 NOTE — Progress Notes (Signed)
 [facility] MD Progress Note  04/02/2024 12:14 PM Allison Bolton  MRN:  [id number] Subjective:   Allison Bolton is a 53 yr old female who presented on 02/17/24 to [facility] ED with a chief complaint of anxiety. In the ED, she was found to be acutely psychotic with tangential speech, agitation, and delusional thought content. Patient was admitted involuntarily to [facility] on 8/14.  PPHx is significant for Substance-Induced Psychosis, Bipolar I Disorder, Stimulant Use Disorder, GAD, Suicide Attempt (2007), and Prior Psychiatric Hospitalizations (last- [facility] 01/2024).   Case was discussed in the multidisciplinary team. MAR was reviewed and patient was compliant with medications.  She received no PRN medications yesterday.   Psychiatric Team made the following recommendations yesterday: -Continue Haldol  10 mg q12 for psychosis and mood stability -Continue Geodon  60 mg BID for psychosis and mood stability -Continue Depakote  DR 750 mg q12 for mood stability -Continue Ativan  0.5 mg QHS or agitation  -Continue Cogentin  1 mg BID for Drug Induced EPS -Continue Nitrofurantoin  100 mg BID for 5 days (Day 1 of 5)    On interview today patient reports she slept good last night.  She reports her appetite is doing good.  She reports no SI, HI, or AVH.  She reports no Paranoia or Ideas of Reference.  She reports no issues with her medications.  She reports that she wants to go home.  Discussed with her that her MOCA score was very concerning and we did not think that she could safely go home.  She became tearful stating she needs to see her kids.  When asked if she had talked with them she stated she has talked with them because she keeps losing their number and then states I know I have dementia.  She then lay down in bed and ended the interview.  She reports no other concerns at present.    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 The direct patient care time included face-to-face time with the patient, reviewing the patient's chart, communicating with other professionals, and coordinating care.    Past Psychiatric History:  Substance-Induced Psychosis, Bipolar I Disorder, Stimulant Use Disorder, GAD, Suicide Attempt (2007), and Prior Psychiatric Hospitalizations (last- [facility] 01/2024).  Past Medical History:  Past Medical History:  Diagnosis Date   Anxiety    Arthritis    Asthma    Bipolar 1 disorder (HCC)    Diverticulosis    Dysrhythmia    sts I have heart palpitations   Fibromyalgia    H/O hiatal hernia    4   Headache(784.0)    High blood pressure    Meniere's disease    S/P colonoscopy    Dr. Golda 2010: few small diverticula at sigmoid. otherwise normal.    Shortness of breath     Past Surgical History:  Procedure Laterality Date   ABDOMINAL HYSTERECTOMY     APPENDECTOMY     CESAREAN SECTION     CHOLECYSTECTOMY     COLONOSCOPY N/A 12/25/2023   Procedure: COLONOSCOPY;  Surgeon: Eartha Angelia Sieving, MD;  Location: [facility];  Service: Gastroenterology;  Laterality: N/A;  11:30am, asa 1   exploratory laparoscopy     FOOT SURGERY  HEMORRHOID SURGERY     LAPAROSCOPIC APPENDECTOMY  02/19/2011   Procedure: APPENDECTOMY LAPAROSCOPIC;  Surgeon: Oneil DELENA Budge;  Location: [facility] ORS;  Service: General;  Laterality: N/A;   LAPAROSCOPY  02/19/2011   Procedure: LAPAROSCOPY DIAGNOSTIC;  Surgeon: Oneil DELENA Budge;  Location: [facility] ORS;  Service: General;  Laterality: N/A;   left ovarian removal     multiple hernia repairs     Right ovarian removal     TONSILLECTOMY     TONSILLECTOMY AND ADENOIDECTOMY     tubes in ears      UMBILICAL HERNIA REPAIR  Dec 2011   Dr. Budge   wisdom teeth removal     Family History:  Family History  Problem Relation Age of Onset   Thyroid  disease Mother    Colon cancer Father    Breast cancer Maternal Aunt    Colon cancer Brother    Colon cancer Other        paternal and maternal grandfather   Meniere's disease Other    Anesthesia problems Neg Hx    Hypotension Neg Hx    Malignant hyperthermia Neg Hx    Pseudochol deficiency Neg Hx    Family Psychiatric  History:  None Reported   Social History:  Social History   Substance and Sexual Activity  Alcohol Use No   Comment: not since June 2012     Social History   Substance and Sexual Activity  Drug Use Yes   Types: Marijuana   Comment: patient denies any-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    Social History   Socioeconomic History   Marital status: Legally Separated    Spouse name: Not on file   Number of children: Not on file   Years of education: Not on file   Highest education level: Not on file  Occupational History   Not on file  Tobacco Use   Smoking status: Every Day    Current packs/day: 0.00    Average packs/day: 0.5 packs/day for 37.3 years (18.7 ttl pk-yrs)    Types: Cigarettes    Start date: 07/07/1981    Last attempt to quit: 11/05/2018    Years since quitting: 5.4   Smokeless tobacco: Never  Vaping Use   Vaping status: Never Used  Substance and Sexual Activity   Alcohol use: No    Comment: not since June 2012   Drug use: Yes    Types: Marijuana    Comment: patient denies any-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   Sexual activity: Never    Birth control/protection: Surgical  Other Topics Concern   Not on file  Social History Narrative   Not on file   Social Drivers of Health   Financial Resource Strain: Not on file  Food Insecurity: Food Insecurity Present (02/18/2024)   Hunger Vital Sign    Worried About Running Out of Food in the Last Year: Sometimes true    Ran  Out of Food in the Last Year: Sometimes true  Transportation Needs: Unmet Transportation Needs (02/18/2024)   PRAPARE - Administrator, Civil Service (Medical): Yes    Lack of Transportation (Non-Medical): Yes  Physical Activity: Not on file  Stress: Not on file  Social Connections: Unknown (10/14/2021)   Received from [facility]   Social Connections    Do your friends and family support you?: Not on file    What agencies support you?: Not on file  Additional Social History:                         Sleep: Good Estimated Sleeping Duration (Last 24 Hours): 5.50-7.00 hours  Appetite:  Good  Current Medications: Current Facility-Administered Medications  Medication Dose Route Frequency Provider Last Rate Last Admin   acetaminophen  (TYLENOL ) tablet 650 mg  650 mg Oral Q6H PRN White, Patrice L, NP   650 mg at 03/31/24 1342   alum & mag hydroxide-simeth (MAALOX/MYLANTA) 200-200-20 MG/5ML suspension 30 mL  30 mL Oral Q4H PRN White, Patrice L, NP   30 mL at 03/24/24 0217   benztropine  (COGENTIN ) tablet 1 mg  1 mg Oral BID White, Patrice L, NP   1 mg at 04/02/24 0817   divalproex  (DEPAKOTE ) DR tablet 750 mg  750 mg Oral Q12H Ji, Andrew, MD   750 mg at 04/02/24 0816   haloperidol  (HALDOL ) tablet 10 mg  10 mg Oral Q12H Ji, Andrew, MD   10 mg at 04/02/24 9183   hydrOXYzine  (ATARAX ) tablet 25 mg  25 mg Oral TID PRN Teresa Jes L, NP   25 mg at 04/02/24 1011   LORazepam  (ATIVAN ) tablet 0.5 mg  0.5 mg Oral QHS Marry Clamp, MD   0.5 mg at 04/01/24 2038   magnesium  hydroxide (MILK OF MAGNESIA) suspension 30 mL  30 mL Oral Daily PRN White, Patrice L, NP       nicotine  polacrilex (NICORETTE ) gum 2 mg  2 mg Oral PRN Trudy Carwin, NP   2 mg at 03/28/24 1716   nitrofurantoin  (macrocrystal-monohydrate) (MACROBID ) capsule 100 mg  100 mg Oral Q12H Mirah Nevins S, DO   100 mg at 04/02/24 9182   OLANZapine  (ZYPREXA ) injection 10 mg  10 mg Intramuscular TID  PRN White, Patrice L, NP       OLANZapine  (ZYPREXA ) injection 5 mg  5 mg Intramuscular TID PRN White, Patrice L, NP       OLANZapine  zydis (ZYPREXA ) disintegrating tablet 5 mg  5 mg Oral TID PRN White, Patrice L, NP   5 mg at 04/02/24 1011   ondansetron  (ZOFRAN -ODT) disintegrating tablet 4 mg  4 mg Oral Q8H PRN Lynnette Barter, MD   4 mg at 03/30/24 0112   traZODone  (DESYREL ) tablet 50 mg  50 mg Oral QHS White, Patrice L, NP   50 mg at 04/01/24 2038   ziprasidone  (GEODON ) capsule 60 mg  60 mg Oral BID WC Marry Clamp, MD   60 mg at 04/02/24 9183    Lab Results:  Results for orders placed or performed during the hospital encounter of 02/18/24 (from the past 48 hours)  Urinalysis, w/ Reflex to Culture (Infection Suspected) -Urine, Random     Status: Abnormal   Collection Time: 03/31/24  6:59 PM  Result Value Ref Range   Specimen Source URINE, RANDOM    Color, Urine YELLOW YELLOW   APPearance CLOUDY (A) CLEAR   Specific Gravity, Urine 1.016 1.005 - 1.030   pH 6.0 5.0 - 8.0   Glucose, UA NEGATIVE NEGATIVE mg/dL   Hgb urine dipstick NEGATIVE NEGATIVE   Bilirubin Urine NEGATIVE NEGATIVE   Ketones, ur NEGATIVE NEGATIVE mg/dL   Protein, ur 30 (A) NEGATIVE mg/dL   Nitrite NEGATIVE NEGATIVE   Leukocytes,Ua LARGE (A) NEGATIVE   RBC / HPF 0-5 0 - 5 RBC/hpf   WBC, UA >50 0 - 5 WBC/hpf    Comment:        Reflex urine culture  not performed if WBC <=10, OR if Squamous epithelial cells >5. If Squamous epithelial cells >5 suggest recollection.    Bacteria, UA MANY (A) NONE SEEN   Squamous Epithelial / HPF 0-5 0 - 5 /HPF   Mucus PRESENT     Comment: Performed at [facility], [address], [place], [place] [address]  Urine Culture     Status: Abnormal (Preliminary result)   Collection Time: 03/31/24  6:59 PM   Specimen: Urine, Random  Result Value Ref Range   Specimen Description      URINE, RANDOM Performed at [facility], [address],  [place], [place] [address]    Special Requests      NONE Reflexed from [id number] Performed at [facility], [address], [place], [place] [address]    Culture (A)     >=100,000 COLONIES/mL GRAM NEGATIVE RODS CULTURE REINCUBATED FOR BETTER GROWTH Performed at [facility], [address], [place], [place] [address]    Report Status PENDING      Blood Alcohol level:  Lab Results  Component Value Date   [facility] <15 02/17/2024   ETH <15 01/14/2024    Metabolic Disorder Labs: Lab Results  Component Value Date   HGBA1C 5.1 01/18/2024   MPG 99.67 01/18/2024   MPG 105.41 05/11/2019   Lab Results  Component Value Date   PROLACTIN 24.1 (H) 05/11/2019   Lab Results  Component Value Date   CHOL 154 01/18/2024   TRIG 180 (H) 01/18/2024   HDL 49 01/18/2024   CHOLHDL 3.1 01/18/2024   VLDL 36 01/18/2024   LDLCALC 69 01/18/2024   LDLCALC 82 05/11/2019    Physical Findings: AIMS:  ,  ,  ,  ,  ,  ,   CIWA:    COWS:     Musculoskeletal: Strength & Muscle Tone: within normal limits Gait & Station: normal Patient leans: N/A  Psychiatric Specialty Exam:  Presentation  General Appearance:  Disheveled  Eye Contact: Fair  Speech: Normal Rate (some slurring)  Speech Volume: Normal  Handedness:No data recorded  Mood and Affect  Mood: Depressed  Affect: Tearful   Thought Process  Thought Processes: Linear  Descriptions of Associations:Loose  Orientation:Partial  Thought Content:Scattered  History of Schizophrenia/Schizoaffective disorder:No  Duration of Psychotic Symptoms:Greater than six months  Hallucinations:Hallucinations: None   Ideas of Reference:None  Suicidal Thoughts:Suicidal Thoughts: No   Homicidal Thoughts:Homicidal Thoughts: No    Sensorium  Memory: Immediate Poor; Recent Poor  Judgment: Impaired  Insight: Lacking   Executive Functions  Concentration: Fair  Attention  Span: Fair  Recall: Poor  Fund of Knowledge: Poor  Language: Fair   Psychomotor Activity  Psychomotor Activity:Psychomotor Activity: Decreased    Assets  Assets: Resilience   Sleep  Sleep:Sleep: Good     Physical Exam: Physical Exam Vitals and nursing note reviewed.  Constitutional:      General: She is not in acute distress.    Appearance: Normal appearance. She is normal weight. She is not ill-appearing or toxic-appearing.  HENT:     Head: Normocephalic and atraumatic.  Pulmonary:     Effort: Pulmonary effort is normal.  Musculoskeletal:        General: Normal range of motion.  Neurological:     Mental Status: She is alert.    Review of Systems  Respiratory:  Negative for cough and shortness of breath.   Cardiovascular:  Negative for chest pain.  Gastrointestinal:  Negative for  abdominal pain, constipation, diarrhea, nausea and vomiting.  Neurological:  Negative for dizziness, weakness and headaches.  Psychiatric/Behavioral:  Negative for depression, hallucinations and suicidal ideas. The patient is not nervous/anxious.    Blood pressure (!) 113/92, pulse 79, temperature (!) 97.5 F (36.4 C), temperature source Oral, resp. rate 18, height 5' 6 (1.676 m), weight 80.7 kg, SpO2 93%. Body mass index is 28.71 kg/m.   Treatment Plan Summary: Daily contact with patient to assess and evaluate symptoms and progress in treatment and Medication management  Allison Bolton is a 53 yr old female who presented on 02/17/24 to [facility] ED with a chief complaint of anxiety. In the ED, she was found to be acutely psychotic with tangential speech, agitation, and delusional thought content. Patient was admitted involuntarily to [facility] on 8/14.  PPHx is significant for Substance-Induced Psychosis, Bipolar I Disorder, Stimulant Use Disorder, GAD, Suicide Attempt (2007), and Prior Psychiatric Hospitalizations (last- [facility] 01/2024).   She is continuing to increase in  activity on the unit but only with prompting of staff.  She continues to not eat or attend to personal care without significant prompting.  She continues to be pleasant and take her medications without issues.  Urine Cultrue resulted with bacterial growth and will continue with treatment.  We will not make any changes to her medications at this time.  We will continue to monitor.    Substance-induced psychosis  Bipolar disorder: -Continue Haldol  10 mg q12 for psychosis and mood stability -Continue Geodon  60 mg BID for psychosis and mood stability -Continue Depakote  DR 750 mg q12 for mood stability -Continue Ativan  0.5 mg QHS or agitation  -Continue Cogentin  1 mg BID for Drug Induced EPS -Continue Agitation Protocol: Zyprexa    Presumed UTI: -Continue Nitrofurantoin  100 mg BID for 5 days (Day 2 of 5)   Dementia in other diseases classified elsewhere, unspecified severity, without behavioral disturbance, psychotic disturbance, mood disturbance, and anxiety: Allison Bolton was given a MOCA on 9/17 and scored an 8.  [facility] SW is seeking a guardian for the patient as she needs 24 hour assistance at this point and they are looking for placement at either SNF or group home.     Nicotine  Dependence: -Continue Nicotine  Gum 2 mg PRN   -Continue Zofran  4 mg q8 PRN nausea/vomiting -Continue PRN's: Tylenol , Maalox, Atarax , Milk of Magnesia, Trazodone    --  The risks/benefits/side-effects/alternatives to medications were discussed in detail with the patient and time was given for questions. The patient consents to medication trials.                -- Metabolic profile and EKG monitoring obtained while on an atypical antipsychotic (BMI: 28.73 Lipid Panel: WNL except Trig: 180 HbgA1c: 5.1)              -- Encouraged patient to participate in unit milieu and in scheduled group therapies              -- Short Term Goals: Ability to identify changes in lifestyle to reduce recurrence of condition will improve,  Ability to verbalize feelings will improve, Ability to disclose and discuss suicidal ideas, Ability to demonstrate self-control will improve, Ability to identify and develop effective coping behaviors will improve, Ability to maintain clinical measurements within normal limits will improve, Compliance with prescribed medications will improve, and Ability to identify triggers associated with substance abuse/mental health issues will improve             -- Long Term Goals: Improvement  in symptoms so as ready for discharge   Safety and Monitoring:             -- Involuntary admission to inpatient psychiatric unit for safety, stabilization and treatment             -- Daily contact with patient to assess and evaluate symptoms and progress in treatment             -- Patient's case to be discussed in multi-disciplinary team meeting             -- Observation Level : q15 minute checks             -- Vital signs:  q12 hours             -- Precautions: suicide, elopement, and assault  Discharge Planning:              -- Social work and case management to assist with discharge planning and identification of hospital follow-up needs prior to discharge             -- Estimated LOS: 7+ more days             -- Discharge Concerns: Need to establish a safety plan; Medication compliance and effectiveness             -- Discharge Goals: Return home with outpatient referrals for mental health follow-up including medication management/psychotherapy   Marsa GORMAN Rosser, DO 04/02/2024, 12:14 PM

## 2024-04-02 NOTE — [facility] Group Notes (Signed)
 Adult Psychoeducational Group Note  Date:  04/02/2024 Time:  1:30 PM  Group Topic/Focus:  Goals Group:   The focus of this group is to help patients establish daily goals to achieve during treatment and discuss how the patient can incorporate goal setting into their daily lives to aide in recovery. Orientation:   The focus of this group is to educate the patient on the purpose and policies of crisis stabilization and provide a format to answer questions about their admission.  The group details unit policies and expectations of patients while admitted.  Participation Level:  Active  Participation Quality:  Appropriate  Affect:  Appropriate  Cognitive:  Disorganized  Insight: Limited  Engagement in Group:  Engaged  Modes of Intervention:  Discussion  Additional Comments:  Pt attended the goals group and remained engaged, but disorganized throughout the duration of the group.   Weda Baumgarner O 04/02/2024, 1:30 PM

## 2024-04-02 NOTE — Plan of Care (Signed)
  Problem: Education: Goal: Mental status will improve Outcome: Progressing   Problem: Activity: Goal: Interest or engagement in activities will improve Outcome: Progressing   Problem: Safety: Goal: Periods of time without injury will increase Outcome: Progressing

## 2024-04-02 NOTE — Progress Notes (Signed)
(  Sleep Hours) -8.5 (Any PRNs that were needed, meds refused, or side effects to meds)-none (Any disturbances and when (visitation, over night)-none (Concerns raised by the patient)- none (SI/HI/AVH)-Denied

## 2024-04-02 NOTE — Group Note (Signed)
 [facility] LCSW Group Therapy Note    Group Date: 04/02/2024 Start Time: 1040 End Time: 1140  Type of Therapy and Topic:  Group Therapy:  Overcoming Obstacles  Participation Level:  [facility] PARTICIPATION LEVEL: Minimal  Mood: Flat  Description of Group:   In this group patients will be encouraged to explore what they see as obstacles to their own wellness and recovery. They will be guided to discuss their thoughts, feelings, and behaviors related to these obstacles. The group will process together ways to cope with barriers, with attention given to specific choices patients can make. Each patient will be challenged to identify changes they are motivated to make in order to overcome their obstacles. This group will be process-oriented, with patients participating in exploration of their own experiences as well as giving and receiving support and challenge from other group members.  Therapeutic Goals: 1. Patient will identify personal and current obstacles as they relate to admission. 2. Patient will identify barriers that currently interfere with their wellness or overcoming obstacles.  3. Patient will identify feelings, thought process and behaviors related to these barriers. 4. Patient will identify two changes they are willing to make to overcome these obstacles:    Summary of Patient Progress Patient shared during open of session, left and walked around majority of group.   Therapeutic Modalities:   Cognitive Behavioral Therapy Solution Focused Therapy Motivational Interviewing Relapse Prevention Therapy   Hunter JONELLE Lever, LCSWA

## 2024-04-02 NOTE — Plan of Care (Signed)
  Problem: Education: Goal: Knowledge of [facility] Education information/materials will improve Outcome: Progressing Goal: Emotional status will improve Outcome: Progressing Goal: Mental status will improve Outcome: Progressing Goal: Verbalization of understanding the information provided will improve Outcome: Progressing   Problem: Activity: Goal: Interest or engagement in activities will improve Outcome: Progressing Goal: Sleeping patterns will improve Outcome: Progressing   Problem: Coping: Goal: Ability to verbalize frustrations and anger appropriately will improve Outcome: Progressing Goal: Ability to demonstrate self-control will improve Outcome: Progressing   Problem: Health Behavior/Discharge Planning: Goal: Identification of resources available to assist in meeting health care needs will improve Outcome: Progressing Goal: Compliance with treatment plan for underlying cause of condition will improve Outcome: Progressing   Problem: Physical Regulation: Goal: Ability to maintain clinical measurements within normal limits will improve Outcome: Progressing   Problem: Safety: Goal: Periods of time without injury will increase Outcome: Progressing   Problem: Education: Goal: Knowledge of [place] [facility] Education information/materials will improve Outcome: Progressing Goal: Emotional status will improve Outcome: Progressing Goal: Mental status will improve Outcome: Progressing Goal: Verbalization of understanding the information provided will improve Outcome: Progressing   Problem: Activity: Goal: Interest or engagement in activities will improve Outcome: Progressing Goal: Sleeping patterns will improve Outcome: Progressing   Problem: Coping: Goal: Ability to verbalize frustrations and anger appropriately will improve Outcome: Progressing Goal: Ability to demonstrate self-control will improve Outcome: Progressing   Problem: Health  Behavior/Discharge Planning: Goal: Identification of resources available to assist in meeting health care needs will improve Outcome: Progressing Goal: Compliance with treatment plan for underlying cause of condition will improve Outcome: Progressing   Problem: Physical Regulation: Goal: Ability to maintain clinical measurements within normal limits will improve Outcome: Progressing   Problem: Safety: Goal: Periods of time without injury will increase Outcome: Progressing   Problem: Activity: Goal: Will identify at least one activity in which they can participate Outcome: Progressing   Problem: Coping: Goal: Ability to identify and develop effective coping behavior will improve Outcome: Progressing Goal: Ability to interact with others will improve Outcome: Progressing Goal: Demonstration of participation in decision-making regarding own care will improve Outcome: Progressing Goal: Ability to use eye contact when communicating with others will improve Outcome: Progressing   Problem: Health Behavior/Discharge Planning: Goal: Identification of resources available to assist in meeting health care needs will improve Outcome: Progressing   Problem: Self-Concept: Goal: Will verbalize positive feelings about self Outcome: Progressing   Problem: Activity: Goal: Will verbalize the importance of balancing activity with adequate rest periods Outcome: Progressing   Problem: Education: Goal: Will be free of psychotic symptoms Outcome: Progressing Goal: Knowledge of the prescribed therapeutic regimen will improve Outcome: Progressing   Problem: Coping: Goal: Coping ability will improve Outcome: Progressing Goal: Will verbalize feelings Outcome: Progressing   Problem: Health Behavior/Discharge Planning: Goal: Compliance with prescribed medication regimen will improve Outcome: Progressing   Problem: Nutritional: Goal: Ability to achieve adequate nutritional intake will  improve Outcome: Progressing   Problem: Role Relationship: Goal: Ability to communicate needs accurately will improve Outcome: Progressing Goal: Ability to interact with others will improve Outcome: Progressing   Problem: Safety: Goal: Ability to redirect hostility and anger into socially appropriate behaviors will improve Outcome: Progressing Goal: Ability to remain free from injury will improve Outcome: Progressing   Problem: Self-Care: Goal: Ability to participate in self-care as condition permits will improve Outcome: Progressing   Problem: Self-Concept: Goal: Will verbalize positive feelings about self Outcome: Progressing

## 2024-04-03 DIAGNOSIS — F0280 Dementia in other diseases classified elsewhere without behavioral disturbance: Secondary | ICD-10-CM | POA: Diagnosis not present

## 2024-04-03 DIAGNOSIS — N390 Urinary tract infection, site not specified: Secondary | ICD-10-CM | POA: Diagnosis not present

## 2024-04-03 DIAGNOSIS — F312 Bipolar disorder, current episode manic severe with psychotic features: Secondary | ICD-10-CM | POA: Diagnosis not present

## 2024-04-03 LAB — URINE CULTURE: Culture: >=100000 — AB

## 2024-04-03 NOTE — Progress Notes (Signed)
 [facility] MD Progress Note  04/03/2024 2:09 PM Allison Bolton  MRN:  [id number] Subjective:   Allison Bolton is a 53 yr old female who presented on 02/17/24 to [facility] ED with a chief complaint of anxiety. In the ED, she was found to be acutely psychotic with tangential speech, agitation, and delusional thought content. Patient was admitted involuntarily to [facility] on 8/14.  PPHx is significant for Substance-Induced Psychosis, Bipolar I Disorder, Stimulant Use Disorder, GAD, Suicide Attempt (2007), and Prior Psychiatric Hospitalizations (last- [facility] 01/2024).   Case was discussed in the multidisciplinary team. MAR was reviewed and patient was compliant with medications.  She received PRN Zofran  and Zyprexa , and Hydroxyzine  x2 yesterday.   Psychiatric Team made the following recommendations yesterday: -Continue Haldol  10 mg q12 for psychosis and mood stability -Continue Geodon  60 mg BID for psychosis and mood stability -Continue Depakote  DR 750 mg q12 for mood stability -Continue Ativan  0.5 mg QHS or agitation  -Continue Cogentin  1 mg BID for Drug Induced EPS -Continue Nitrofurantoin  100 mg BID for 5 days (Day 2 of 5)    On interview today patient reports she slept good last night.  She reports her appetite is doing good.  She reports no SI, HI, or AVH.  She reports no Paranoia or Ideas of Reference.  She reports no issues with her medications.  She reports that her urinary symptoms have been improving with the antibiotics.  She reports no other concerns at present.   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 The direct patient care time  included face-to-face time with the patient, reviewing the patient's chart, communicating with other professionals, and coordinating care.    Past Psychiatric History:  Substance-Induced Psychosis, Bipolar I Disorder, Stimulant Use Disorder, GAD, Suicide Attempt (2007), and Prior Psychiatric Hospitalizations (last- [facility] 01/2024).  Past Medical History:  Past Medical History:  Diagnosis Date   Anxiety    Arthritis    Asthma    Bipolar 1 disorder (HCC)    Diverticulosis    Dysrhythmia    sts I have heart palpitations   Fibromyalgia    H/O hiatal hernia    4   Headache(784.0)    High blood pressure    Meniere's disease    S/P colonoscopy    Dr. Golda 2010: few small diverticula at sigmoid. otherwise normal.    Shortness of breath     Past Surgical History:  Procedure Laterality Date   ABDOMINAL HYSTERECTOMY     APPENDECTOMY     CESAREAN SECTION     CHOLECYSTECTOMY     COLONOSCOPY N/A 12/25/2023   Procedure: COLONOSCOPY;  Surgeon: Eartha Angelia Sieving, MD;  Location: [facility];  Service: Gastroenterology;  Laterality: N/A;  11:30am, asa 1   exploratory laparoscopy     FOOT SURGERY     HEMORRHOID SURGERY     LAPAROSCOPIC APPENDECTOMY  02/19/2011   Procedure: APPENDECTOMY LAPAROSCOPIC;  Surgeon: Oneil DELENA Budge;  Location: [facility] ORS;  Service: General;  Laterality: N/A;   LAPAROSCOPY  02/19/2011   Procedure: LAPAROSCOPY DIAGNOSTIC;  Surgeon: Oneil DELENA Budge;  Location: [facility] ORS;  Service: General;  Laterality: N/A;   left ovarian removal  multiple hernia repairs     Right ovarian removal     TONSILLECTOMY     TONSILLECTOMY AND ADENOIDECTOMY     tubes in ears     UMBILICAL HERNIA REPAIR  Dec 2011   Dr. Mavis   wisdom teeth removal     Family History:  Family History  Problem Relation Age of Onset   Thyroid  disease Mother    Colon cancer Father    Breast cancer Maternal Aunt    Colon cancer Brother    Colon cancer Other        paternal and maternal grandfather    Meniere's disease Other    Anesthesia problems Neg Hx    Hypotension Neg Hx    Malignant hyperthermia Neg Hx    Pseudochol deficiency Neg Hx    Family Psychiatric  History:  None Reported   Social History:  Social History   Substance and Sexual Activity  Alcohol Use No   Comment: not since June 2012     Social History   Substance and Sexual Activity  Drug Use Yes   Types: Marijuana   Comment: patient denies any-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    Social History   Socioeconomic History   Marital status: Legally Separated    Spouse name: Not on file   Number of children: Not on file   Years of education: Not on file   Highest education level: Not on file  Occupational History   Not on file  Tobacco Use   Smoking status: Every Day    Current packs/day: 0.00    Average packs/day: 0.5 packs/day for 37.3 years (18.7 ttl pk-yrs)    Types: Cigarettes    Start date: 07/07/1981    Last attempt to quit: 11/05/2018    Years since quitting: 5.4   Smokeless tobacco: Never  Vaping Use   Vaping status: Never Used  Substance and Sexual Activity   Alcohol use: No    Comment: not since June 2012   Drug use: Yes    Types: Marijuana    Comment: patient denies any-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   Sexual activity: Never    Birth control/protection: Surgical  Other Topics Concern   Not on file  Social History Narrative   Not on file   Social Drivers of Health   Financial Resource Strain: Not on file  Food Insecurity: Food Insecurity Present (02/18/2024)   Hunger Vital Sign    Worried About Running Out of Food in the Last Year: Sometimes true    Ran Out of Food in the Last Year: Sometimes true  Transportation Needs: Unmet Transportation Needs (02/18/2024)   PRAPARE - Administrator, Civil Service (Medical): Yes    Lack of Transportation (Non-Medical): Yes  Physical Activity: Not on file  Stress: Not on file  Social Connections: Unknown  (10/14/2021)   Received from [facility]   Social Connections    Do your friends and family support you?: Not on file    What agencies support you?: Not on file   Additional Social History:                         Sleep: Good Estimated Sleeping Duration (Last 24 Hours): 6.25-8.50 hours  Appetite:  Good  Current Medications: Current Facility-Administered Medications  Medication Dose Route Frequency Provider Last Rate Last Admin   acetaminophen  (TYLENOL ) tablet 650 mg  650 mg Oral Q6H PRN White, Patrice L, NP   650 mg at 03/31/24 1342   alum & mag hydroxide-simeth (MAALOX/MYLANTA) 200-200-20 MG/5ML suspension 30 mL  30 mL Oral Q4H PRN White, Patrice L, NP   30 mL at 03/24/24 0217   benztropine  (COGENTIN ) tablet 1 mg  1 mg Oral BID White, Patrice L, NP   1 mg at 04/03/24 0745   divalproex  (DEPAKOTE ) DR tablet 750 mg  750 mg Oral Q12H Ji, Andrew, MD   750 mg at 04/03/24 0745   haloperidol  (HALDOL ) tablet 10 mg  10 mg Oral Q12H Ji, Andrew, MD   10 mg at 04/03/24 0745   hydrOXYzine  (ATARAX ) tablet 25 mg  25 mg Oral TID PRN White, Patrice L, NP   25 mg at 04/02/24 1552   LORazepam  (ATIVAN ) tablet 0.5 mg  0.5 mg Oral QHS Marry Clamp, MD   0.5 mg at 04/02/24 2032   magnesium  hydroxide (MILK OF MAGNESIA) suspension 30 mL  30 mL Oral Daily PRN White, Patrice L, NP       nicotine  polacrilex (NICORETTE ) gum 2 mg  2 mg Oral PRN Trudy Carwin, NP   2 mg at 03/28/24 1716   nitrofurantoin  (macrocrystal-monohydrate) (MACROBID ) capsule 100 mg  100 mg Oral Q12H Justeen Hehr S, DO   100 mg at 04/03/24 0745   OLANZapine  (ZYPREXA ) injection 10 mg  10 mg Intramuscular TID PRN White, Patrice L, NP       OLANZapine  (ZYPREXA ) injection 5 mg  5 mg Intramuscular TID PRN White, Patrice L, NP       OLANZapine  zydis (ZYPREXA ) disintegrating tablet 5 mg  5 mg Oral TID PRN White, Patrice L, NP   5 mg at 04/02/24 1011   ondansetron  (ZOFRAN -ODT) disintegrating tablet 4 mg  4 mg Oral  Q8H PRN Lynnette Barter, MD   4 mg at 04/02/24 1946   traZODone  (DESYREL ) tablet 50 mg  50 mg Oral QHS White, Patrice L, NP   50 mg at 04/02/24 2032   ziprasidone  (GEODON ) capsule 60 mg  60 mg Oral BID WC Marry Clamp, MD   60 mg at 04/03/24 0745    Lab Results:  No results found for this or any previous visit (from the past 48 hours).    Blood Alcohol level:  Lab Results  Component Value Date   [facility] <15 02/17/2024   ETH <15 01/14/2024    Metabolic Disorder Labs: Lab Results  Component Value Date   HGBA1C 5.1 01/18/2024   MPG 99.67 01/18/2024   MPG 105.41 05/11/2019   Lab Results  Component Value Date   PROLACTIN 24.1 (H) 05/11/2019   Lab Results  Component Value Date   CHOL 154 01/18/2024   TRIG 180 (H) 01/18/2024   HDL 49 01/18/2024   CHOLHDL 3.1 01/18/2024   VLDL 36 01/18/2024   LDLCALC 69 01/18/2024   LDLCALC 82 05/11/2019    Physical Findings: AIMS:  ,  ,  ,  ,  ,  ,   CIWA:    COWS:     Musculoskeletal: Strength & Muscle Tone: within normal limits Gait & Station: normal Patient leans: N/A  Psychiatric Specialty Exam:  Presentation  General Appearance:  Disheveled  Eye Contact: Fair  Speech: Normal Rate (some slurring)  Speech Volume: Normal  Handedness:No data recorded  Mood and Affect  Mood: Euthymic  Affect: -- (always smiling)   Thought Process  Thought Processes: Linear  Descriptions of Associations:Loose  Orientation:Partial  Thought Content:Scattered  History of Schizophrenia/Schizoaffective disorder:No  Duration of Psychotic Symptoms:Greater than six months  Hallucinations:Hallucinations: None   Ideas of Reference:None  Suicidal Thoughts:Suicidal Thoughts: No   Homicidal Thoughts:Homicidal Thoughts: No    Sensorium  Memory: Immediate Poor; Recent Poor  Judgment: Impaired  Insight: Lacking   Executive Functions  Concentration: Fair  Attention Span: Fair  Recall: Poor  Fund of  Knowledge: Poor  Language: Fair   Psychomotor Activity  Psychomotor Activity:Psychomotor Activity: Normal    Assets  Assets: Resilience   Sleep  Sleep:Sleep: Good     Physical Exam: Physical Exam Vitals and nursing note reviewed.  Constitutional:      General: She is not in acute distress.    Appearance: Normal appearance. She is not toxic-appearing.  HENT:     Head: Normocephalic and atraumatic.  Pulmonary:     Effort: Pulmonary effort is normal.  Neurological:     Mental Status: She is alert.    Review of Systems  Respiratory:  Negative for cough and shortness of breath.   Cardiovascular:  Negative for chest pain.  Gastrointestinal:  Negative for abdominal pain, constipation, diarrhea, nausea and vomiting.  Neurological:  Negative for dizziness, weakness and headaches.  Psychiatric/Behavioral:  Negative for depression, hallucinations and suicidal ideas. The patient is not nervous/anxious.    Blood pressure 115/84, pulse 76, temperature (!) 97.5 F (36.4 C), temperature source Oral, resp. rate 18, height 5' 6 (1.676 m), weight 80.7 kg, SpO2 98%. Body mass index is 28.71 kg/m.   Treatment Plan Summary: Daily contact with patient to assess and evaluate symptoms and progress in treatment and Medication management  Allison Bolton is a 52 yr old female who presented on 02/17/24 to [facility] ED with a chief complaint of anxiety. In the ED, she was found to be acutely psychotic with tangential speech, agitation, and delusional thought content. Patient was admitted involuntarily to [facility] on 8/14.  PPHx is significant for Substance-Induced Psychosis, Bipolar I Disorder, Stimulant Use Disorder, GAD, Suicide Attempt (2007), and Prior Psychiatric Hospitalizations (last- [facility] 01/2024).   She continues to try to interact with others on the unit but continues to be very disorganized.  She reports improvement in her urinary symptoms and her urine culture resulted in E.  Coli that is sensitive to Nitrofurantoin  so we will continue with this.  We will not make any changes to her medications at this time.  We will continue to monitor.   Substance-induced psychosis  Bipolar disorder: -Continue Haldol  10 mg q12 for psychosis and mood stability -Continue Geodon  60 mg BID for psychosis and mood stability -Continue Depakote  DR 750 mg q12 for mood stability -Continue Ativan  0.5 mg QHS or agitation  -Continue Cogentin  1 mg BID for Drug Induced EPS -Continue Agitation Protocol: Zyprexa    Presumed UTI: -Continue Nitrofurantoin  100 mg BID for 5 days (Day 3 of 5)   Dementia in other diseases classified elsewhere, unspecified severity, without behavioral disturbance, psychotic disturbance, mood disturbance, and anxiety: Dama was given a MOCA on 9/17 and scored an 8.  [facility] SW is seeking a guardian for the patient as she needs 24 hour assistance at this point and they are looking for placement at either SNF or group home.     Nicotine  Dependence: -Continue Nicotine  Gum 2 mg PRN   -Continue Zofran  4 mg q8 PRN nausea/vomiting -Continue PRN's: Tylenol , Maalox, Atarax , Milk of Magnesia, Trazodone    --  The risks/benefits/side-effects/alternatives to medications were discussed in detail with the patient and time was  given for questions. The patient consents to medication trials.                -- Metabolic profile and EKG monitoring obtained while on an atypical antipsychotic (BMI: 28.73 Lipid Panel: WNL except Trig: 180 HbgA1c: 5.1)              -- Encouraged patient to participate in unit milieu and in scheduled group therapies              -- Short Term Goals: Ability to identify changes in lifestyle to reduce recurrence of condition will improve, Ability to verbalize feelings will improve, Ability to disclose and discuss suicidal ideas, Ability to demonstrate self-control will improve, Ability to identify and develop effective coping behaviors will improve, Ability  to maintain clinical measurements within normal limits will improve, Compliance with prescribed medications will improve, and Ability to identify triggers associated with substance abuse/mental health issues will improve             -- Long Term Goals: Improvement in symptoms so as ready for discharge   Safety and Monitoring:             -- Involuntary admission to inpatient psychiatric unit for safety, stabilization and treatment             -- Daily contact with patient to assess and evaluate symptoms and progress in treatment             -- Patient's case to be discussed in multi-disciplinary team meeting             -- Observation Level : q15 minute checks             -- Vital signs:  q12 hours             -- Precautions: suicide, elopement, and assault  Discharge Planning:              -- Social work and case management to assist with discharge planning and identification of hospital follow-up needs prior to discharge             -- Estimated LOS: 7+ more days             -- Discharge Concerns: Need to establish a safety plan; Medication compliance and effectiveness             -- Discharge Goals: Return home with outpatient referrals for mental health follow-up including medication management/psychotherapy   Marsa GORMAN Rosser, DO 04/03/2024, 2:09 PM

## 2024-04-03 NOTE — Group Note (Signed)
 Date:  04/03/2024 Time:  8:34 PM  Group Topic/Focus:  Wrap-Up Group:   The focus of this group is to help patients review their daily goal of treatment and discuss progress on daily workbooks.    Participation Level:  Did Not Attend   Krystyl Cannell Dacosta 04/03/2024, 8:34 PM

## 2024-04-03 NOTE — Progress Notes (Signed)
(  Sleep Hours) -8.25 (Any PRNs that were needed, meds refused, or side effects to meds)- Zofran  (Any disturbances and when (visitation, over night)-none (Concerns raised by the patient)- none (SI/HI/AVH)-denied

## 2024-04-03 NOTE — Progress Notes (Signed)
   04/03/24 0900  Psych Admission Type (Psych Patients Only)  Admission Status Involuntary  Psychosocial Assessment  Patient Complaints Confusion  Eye Contact Fair  Facial Expression Animated  Affect Preoccupied  Speech Soft  Interaction Childlike  Motor Activity Slow  Appearance/Hygiene In scrubs  Behavior Characteristics Cooperative  Mood Preoccupied;Pleasant  Thought Process  Coherency Disorganized  Content Preoccupation  Delusions None reported or observed  Perception Depersonalization  Hallucination None reported or observed  Judgment Poor  Confusion Mild  Danger to Self  Current suicidal ideation? Denies  Description of Suicide Plan No Plan  Agreement Not to Harm Self Yes  Description of Agreement Verbal  Danger to Others  Danger to Others None reported or observed

## 2024-04-03 NOTE — Plan of Care (Signed)
  Problem: Education: Goal: Knowledge of [facility] Education information/materials will improve Outcome: Progressing Goal: Emotional status will improve Outcome: Progressing Goal: Mental status will improve Outcome: Progressing Goal: Verbalization of understanding the information provided will improve Outcome: Progressing   Problem: Activity: Goal: Interest or engagement in activities will improve Outcome: Progressing Goal: Sleeping patterns will improve Outcome: Progressing   Problem: Coping: Goal: Ability to verbalize frustrations and anger appropriately will improve Outcome: Progressing Goal: Ability to demonstrate self-control will improve Outcome: Progressing   Problem: Health Behavior/Discharge Planning: Goal: Identification of resources available to assist in meeting health care needs will improve Outcome: Progressing Goal: Compliance with treatment plan for underlying cause of condition will improve Outcome: Progressing   Problem: Physical Regulation: Goal: Ability to maintain clinical measurements within normal limits will improve Outcome: Progressing   Problem: Safety: Goal: Periods of time without injury will increase Outcome: Progressing   Problem: Education: Goal: Knowledge of [place] [facility] Education information/materials will improve Outcome: Progressing Goal: Emotional status will improve Outcome: Progressing Goal: Mental status will improve Outcome: Progressing Goal: Verbalization of understanding the information provided will improve Outcome: Progressing   Problem: Activity: Goal: Interest or engagement in activities will improve Outcome: Progressing Goal: Sleeping patterns will improve Outcome: Progressing   Problem: Coping: Goal: Ability to verbalize frustrations and anger appropriately will improve Outcome: Progressing Goal: Ability to demonstrate self-control will improve Outcome: Progressing   Problem: Health  Behavior/Discharge Planning: Goal: Identification of resources available to assist in meeting health care needs will improve Outcome: Progressing Goal: Compliance with treatment plan for underlying cause of condition will improve Outcome: Progressing   Problem: Physical Regulation: Goal: Ability to maintain clinical measurements within normal limits will improve Outcome: Progressing   Problem: Safety: Goal: Periods of time without injury will increase Outcome: Progressing   Problem: Activity: Goal: Will identify at least one activity in which they can participate Outcome: Progressing   Problem: Coping: Goal: Ability to identify and develop effective coping behavior will improve Outcome: Progressing Goal: Ability to interact with others will improve Outcome: Progressing Goal: Demonstration of participation in decision-making regarding own care will improve Outcome: Progressing Goal: Ability to use eye contact when communicating with others will improve Outcome: Progressing   Problem: Health Behavior/Discharge Planning: Goal: Identification of resources available to assist in meeting health care needs will improve Outcome: Progressing   Problem: Self-Concept: Goal: Will verbalize positive feelings about self Outcome: Progressing   Problem: Activity: Goal: Will verbalize the importance of balancing activity with adequate rest periods Outcome: Progressing   Problem: Education: Goal: Will be free of psychotic symptoms Outcome: Progressing Goal: Knowledge of the prescribed therapeutic regimen will improve Outcome: Progressing   Problem: Coping: Goal: Coping ability will improve Outcome: Progressing Goal: Will verbalize feelings Outcome: Progressing   Problem: Health Behavior/Discharge Planning: Goal: Compliance with prescribed medication regimen will improve Outcome: Progressing   Problem: Nutritional: Goal: Ability to achieve adequate nutritional intake will  improve Outcome: Progressing   Problem: Role Relationship: Goal: Ability to communicate needs accurately will improve Outcome: Progressing Goal: Ability to interact with others will improve Outcome: Progressing   Problem: Safety: Goal: Ability to redirect hostility and anger into socially appropriate behaviors will improve Outcome: Progressing Goal: Ability to remain free from injury will improve Outcome: Progressing   Problem: Self-Care: Goal: Ability to participate in self-care as condition permits will improve Outcome: Progressing   Problem: Self-Concept: Goal: Will verbalize positive feelings about self Outcome: Progressing

## 2024-04-03 NOTE — [facility] Group Notes (Signed)
 Adult Psychoeducational Group Note  Date:  04/03/2024 Time:  7:31 PM  Group Topic/Focus:  Goals Group:   The focus of this group is to help patients establish daily goals to achieve during treatment and discuss how the patient can incorporate goal setting into their daily lives to aide in recovery. Orientation:   The focus of this group is to educate the patient on the purpose and policies of crisis stabilization and provide a format to answer questions about their admission.  The group details unit policies and expectations of patients while admitted.  Participation Level:  Did Not Attend  Participation Quality:    Affect:    Cognitive:    Insight:   Engagement in Group:    Modes of Intervention:    Additional Comments:    Gregoria Selvy O 04/03/2024, 7:31 PM

## 2024-04-04 DIAGNOSIS — N390 Urinary tract infection, site not specified: Secondary | ICD-10-CM | POA: Diagnosis not present

## 2024-04-04 DIAGNOSIS — F0280 Dementia in other diseases classified elsewhere without behavioral disturbance: Secondary | ICD-10-CM | POA: Diagnosis not present

## 2024-04-04 DIAGNOSIS — F312 Bipolar disorder, current episode manic severe with psychotic features: Secondary | ICD-10-CM | POA: Diagnosis not present

## 2024-04-04 MED ORDER — TRAZODONE HCL 50 MG PO TABS
50.0000 mg | ORAL_TABLET | Freq: Once | ORAL | Status: AC
  Administered 2024-04-04: 50 mg via ORAL
  Filled 2024-04-04: qty 1

## 2024-04-04 NOTE — Progress Notes (Signed)
   04/04/24 0800  Psych Admission Type (Psych Patients Only)  Admission Status Involuntary  Psychosocial Assessment  Patient Complaints Confusion  Eye Contact Fair  Facial Expression Animated  Affect Preoccupied  Speech Soft  Interaction Childlike  Motor Activity Slow  Appearance/Hygiene In scrubs  Behavior Characteristics Cooperative  Mood Preoccupied;Pleasant  Thought Process  Coherency Disorganized  Content Preoccupation  Delusions None reported or observed  Perception Derealization  Hallucination None reported or observed  Judgment Poor  Confusion Mild  Danger to Self  Current suicidal ideation? Denies  Description of Suicide Plan No Plan  Agreement Not to Harm Self Yes  Description of Agreement Verbal  Danger to Others  Danger to Others None reported or observed

## 2024-04-04 NOTE — Group Note (Signed)
 Recreation Therapy Group Note   Group Topic:Team Building  Group Date: 04/04/2024 Start Time: 1036 End Time: 1100 Facilitators: Kaylum Shrum-McCall, LRT,CTRS Location: [address] Dayroom   Group Topic: Communication, Team Building, Problem Solving  Goal Area(s) Addresses:  Patient will effectively work with peer towards shared goal.  Patient will identify skills used to make activity successful.  Patient will identify how skills used during activity can be applied to reach post d/c goals.   Behavioral Response: Moderate  Intervention: STEM Activity- Glass blower/designer  Activity: Tallest [employer]. In teams of 5-6, patients were given 11 craft pipe cleaners. Using the materials provided, patients were instructed to compete again the opposing team(s) to build the tallest free-standing structure from floor level. The activity was timed; difficulty increased by Clinical research associate as Production designer, theatre/television/film continued.  Systematically resources were removed with additional directions for example, placing one arm behind their back, working in silence, and shape stipulations. LRT facilitated post-activity discussion reviewing team processes and necessary communication skills involved in completion. Patients were encouraged to reflect how the skills utilized, or not utilized, in this activity can be incorporated to positively impact support systems post discharge.  Education: Pharmacist, community, Scientist, physiological, Discharge Planning   Education Outcome: Acknowledges education/In group clarification offered/Needs additional education.    Affect/Mood: Congruent   Participation Level: Moderate   Participation Quality: Minimal Cues   Behavior: Appropriate   Speech/Thought Process: Incoherent   Insight: Lacking   Judgement: Lacking    Modes of Intervention: STEM Activity   Patient Response to Interventions:  Receptive   Education Outcome:  In group clarification offered    Clinical  Observations/Individualized Feedback: Pt was smiling throughout group session. It was difficult trying to make out what pt was talking about. Pt attempted to assist peers in completing tower. Pt was appropriate and would respond to peers when they would ask her to assist them and engage with them.      Plan: Continue to engage patient in RT group sessions 2-3x/week.   Nuriyah Hanline-McCall, LRT,CTRS 04/04/2024 1:07 PM

## 2024-04-04 NOTE — Progress Notes (Signed)
   04/04/24 2015  Psych Admission Type (Psych Patients Only)  Admission Status Involuntary  Psychosocial Assessment  Patient Complaints None  Eye Contact Fair  Facial Expression Animated;Fixed smile  Affect Preoccupied  Speech Soft  Interaction Childlike  Motor Activity Slow  Appearance/Hygiene In scrubs  Behavior Characteristics Cooperative  Mood Preoccupied;Pleasant  Aggressive Behavior  Effect No apparent injury  Thought Process  Coherency Disorganized  Content Preoccupation  Delusions None reported or observed  Perception Derealization  Hallucination None reported or observed  Judgment Poor  Confusion Mild  Danger to Self  Current suicidal ideation? Denies  Danger to Others  Danger to Others None reported or observed

## 2024-04-04 NOTE — Group Note (Signed)
 Date:  04/04/2024 Time:  8:52 PM  Group Topic/Focus:  Wrap-Up Group:   The focus of this group is to help patients review their daily goal of treatment and discuss progress on daily workbooks.    Participation Level:  Did Not Attend  [name] 04/04/2024, 8:52 PM

## 2024-04-04 NOTE — Progress Notes (Signed)
 Collateral contact - Izetta Roses ([facility] Adult Protective Services Social Worker) [phone]   CSW left a voicemail.   Naythen Heikkila, LCSWA 04/04/2024

## 2024-04-04 NOTE — Progress Notes (Signed)
 [facility] MD Progress Note  04/04/2024 4:10 PM Allison Bolton  MRN:  [id number] Subjective:   Allison Bolton is a 53 yr old female who presented on 02/17/24 to [facility] ED with a chief complaint of anxiety. In the ED, she was found to be acutely psychotic with tangential speech, agitation, and delusional thought content. Patient was admitted involuntarily to [facility] on 8/14.  PPHx is significant for Substance-Induced Psychosis, Bipolar I Disorder, Stimulant Use Disorder, GAD, Suicide Attempt (2007), and Prior Psychiatric Hospitalizations (last- [facility] 01/2024).   Case was discussed in the multidisciplinary team. MAR was reviewed and patient was compliant with medications.  She received PRN Hydroxyzine  yesterday.   Psychiatric Team made the following recommendations yesterday: -Continue Haldol  10 mg q12 for psychosis and mood stability -Continue Geodon  60 mg BID for psychosis and mood stability -Continue Depakote  DR 750 mg q12 for mood stability -Continue Ativan  0.5 mg QHS or agitation  -Continue Cogentin  1 mg BID for Drug Induced EPS -Continue Nitrofurantoin  100 mg BID for 5 days (Day 3 of 5)    On interview today patient reports she slept good last night.  She reports her appetite is doing good.  She reports no SI, HI, or AVH.  She reports no Paranoia or Ideas of Reference.  She reports no issues with her medications.  She reports that her urinary symptoms are better.  Discussed with her that we were continuing to look for placement.  She reports no other concerns at present.    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 The direct patient  care time included face-to-face time with the patient, reviewing the patient's chart, communicating with other professionals, and coordinating care.    Past Psychiatric History:  Substance-Induced Psychosis, Bipolar I Disorder, Stimulant Use Disorder, GAD, Suicide Attempt (2007), and Prior Psychiatric Hospitalizations (last- [facility] 01/2024).  Past Medical History:  Past Medical History:  Diagnosis Date   Anxiety    Arthritis    Asthma    Bipolar 1 disorder (HCC)    Diverticulosis    Dysrhythmia    sts I have heart palpitations   Fibromyalgia    H/O hiatal hernia    4   Headache(784.0)    High blood pressure    Meniere's disease    S/P colonoscopy    Dr. Golda 2010: few small diverticula at sigmoid. otherwise normal.    Shortness of breath     Past Surgical History:  Procedure Laterality Date   ABDOMINAL HYSTERECTOMY     APPENDECTOMY     CESAREAN SECTION     CHOLECYSTECTOMY     COLONOSCOPY N/A 12/25/2023   Procedure: COLONOSCOPY;  Surgeon: Eartha Angelia Sieving, MD;  Location: [facility];  Service: Gastroenterology;  Laterality: N/A;  11:30am, asa 1   exploratory laparoscopy     FOOT SURGERY     HEMORRHOID SURGERY     LAPAROSCOPIC APPENDECTOMY  02/19/2011   Procedure: APPENDECTOMY LAPAROSCOPIC;  Surgeon: Oneil DELENA Budge;  Location: [facility] ORS;  Service: General;  Laterality: N/A;   LAPAROSCOPY  02/19/2011   Procedure: LAPAROSCOPY DIAGNOSTIC;  Surgeon: Oneil DELENA Budge;  Location: [facility] ORS;  Service: General;  Laterality: N/A;   left ovarian removal  multiple hernia repairs     Right ovarian removal     TONSILLECTOMY     TONSILLECTOMY AND ADENOIDECTOMY     tubes in ears     UMBILICAL HERNIA REPAIR  Dec 2011   Dr. Mavis   wisdom teeth removal     Family History:  Family History  Problem Relation Age of Onset   Thyroid  disease Mother    Colon cancer Father    Breast cancer Maternal Aunt    Colon cancer Brother    Colon cancer Other        paternal and maternal  grandfather   Meniere's disease Other    Anesthesia problems Neg Hx    Hypotension Neg Hx    Malignant hyperthermia Neg Hx    Pseudochol deficiency Neg Hx    Family Psychiatric  History:  None Reported   Social History:  Social History   Substance and Sexual Activity  Alcohol Use No   Comment: not since June 2012     Social History   Substance and Sexual Activity  Drug Use Yes   Types: Marijuana   Comment: patient denies any-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    Social History   Socioeconomic History   Marital status: Legally Separated    Spouse name: Not on file   Number of children: Not on file   Years of education: Not on file   Highest education level: Not on file  Occupational History   Not on file  Tobacco Use   Smoking status: Every Day    Current packs/day: 0.00    Average packs/day: 0.5 packs/day for 37.3 years (18.7 ttl pk-yrs)    Types: Cigarettes    Start date: 07/07/1981    Last attempt to quit: 11/05/2018    Years since quitting: 5.4   Smokeless tobacco: Never  Vaping Use   Vaping status: Never Used  Substance and Sexual Activity   Alcohol use: No    Comment: not since June 2012   Drug use: Yes    Types: Marijuana    Comment: patient denies any-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   Sexual activity: Never    Birth control/protection: Surgical  Other Topics Concern   Not on file  Social History Narrative   Not on file   Social Drivers of Health   Financial Resource Strain: Not on file  Food Insecurity: Food Insecurity Present (02/18/2024)   Hunger Vital Sign    Worried About Running Out of Food in the Last Year: Sometimes true    Ran Out of Food in the Last Year: Sometimes true  Transportation Needs: Unmet Transportation Needs (02/18/2024)   PRAPARE - Administrator, Civil Service (Medical): Yes    Lack of Transportation (Non-Medical): Yes  Physical Activity: Not on file  Stress: Not on file  Social Connections:  Unknown (10/14/2021)   Received from [facility]   Social Connections    Do your friends and family support you?: Not on file    What agencies support you?: Not on file   Additional Social History:                         Sleep: Good Estimated Sleeping Duration (Last 24 Hours): 3.25-4.25 hours  Appetite:  Good  Current Medications: Current Facility-Administered Medications  Medication Dose Route Frequency Provider Last Rate Last Admin   acetaminophen  (TYLENOL ) tablet 650 mg  650 mg Oral Q6H PRN White, Patrice L, NP   650 mg at 03/31/24 1342   alum & mag hydroxide-simeth (MAALOX/MYLANTA) 200-200-20 MG/5ML suspension 30 mL  30 mL Oral Q4H PRN White, Patrice L, NP   30 mL at 03/24/24 0217   benztropine  (COGENTIN ) tablet 1 mg  1 mg Oral BID White, Patrice L, NP   1 mg at 04/04/24 0914   divalproex  (DEPAKOTE ) DR tablet 750 mg  750 mg Oral Q12H Ji, Andrew, MD   750 mg at 04/04/24 0913   haloperidol  (HALDOL ) tablet 10 mg  10 mg Oral Q12H Ji, Andrew, MD   10 mg at 04/04/24 0914   hydrOXYzine  (ATARAX ) tablet 25 mg  25 mg Oral TID PRN White, Patrice L, NP   25 mg at 04/03/24 2312   LORazepam  (ATIVAN ) tablet 0.5 mg  0.5 mg Oral QHS Marry Clamp, MD   0.5 mg at 04/03/24 2049   magnesium  hydroxide (MILK OF MAGNESIA) suspension 30 mL  30 mL Oral Daily PRN White, Patrice L, NP       nicotine  polacrilex (NICORETTE ) gum 2 mg  2 mg Oral PRN Trudy Carwin, NP   2 mg at 03/28/24 1716   nitrofurantoin  (macrocrystal-monohydrate) (MACROBID ) capsule 100 mg  100 mg Oral Q12H Dovber Ernest S, DO   100 mg at 04/04/24 9086   OLANZapine  (ZYPREXA ) injection 10 mg  10 mg Intramuscular TID PRN White, Patrice L, NP       OLANZapine  (ZYPREXA ) injection 5 mg  5 mg Intramuscular TID PRN White, Patrice L, NP       OLANZapine  zydis (ZYPREXA ) disintegrating tablet 5 mg  5 mg Oral TID PRN White, Patrice L, NP   5 mg at 04/02/24 1011   ondansetron  (ZOFRAN -ODT) disintegrating tablet 4 mg  4  mg Oral Q8H PRN Lynnette Barter, MD   4 mg at 04/02/24 1946   traZODone  (DESYREL ) tablet 50 mg  50 mg Oral QHS White, Patrice L, NP   50 mg at 04/03/24 2049   ziprasidone  (GEODON ) capsule 60 mg  60 mg Oral BID WC Marry Clamp, MD   60 mg at 04/04/24 9085    Lab Results:  No results found for this or any previous visit (from the past 48 hours).    Blood Alcohol level:  Lab Results  Component Value Date   [facility] <15 02/17/2024   ETH <15 01/14/2024    Metabolic Disorder Labs: Lab Results  Component Value Date   HGBA1C 5.1 01/18/2024   MPG 99.67 01/18/2024   MPG 105.41 05/11/2019   Lab Results  Component Value Date   PROLACTIN 24.1 (H) 05/11/2019   Lab Results  Component Value Date   CHOL 154 01/18/2024   TRIG 180 (H) 01/18/2024   HDL 49 01/18/2024   CHOLHDL 3.1 01/18/2024   VLDL 36 01/18/2024   LDLCALC 69 01/18/2024   LDLCALC 82 05/11/2019    Physical Findings: AIMS:  ,  ,  ,  ,  ,  ,   CIWA:    COWS:     Musculoskeletal: Strength & Muscle Tone: within normal limits Gait & Station: normal Patient leans: N/A  Psychiatric Specialty Exam:  Presentation  General Appearance:  Disheveled  Eye Contact: Fair  Speech: Normal Rate (some slurring)  Speech Volume: Normal  Handedness:No data recorded  Mood and Affect  Mood: Euthymic  Affect: Other (comment) (always smiling)   Thought Process  Thought Processes: Linear  Descriptions of Associations:Loose  Orientation:Partial  Thought Content:Scattered  History of Schizophrenia/Schizoaffective disorder:No  Duration of Psychotic Symptoms:Greater than six months  Hallucinations:Hallucinations: None   Ideas of Reference:None  Suicidal Thoughts:Suicidal Thoughts: No   Homicidal Thoughts:Homicidal Thoughts: No    Sensorium  Memory: Immediate Poor; Recent Poor  Judgment: Impaired  Insight: Lacking   Executive Functions  Concentration: Fair  Attention  Span: Fair  Recall: Poor  Fund of Knowledge: Poor  Language: Fair   Psychomotor Activity  Psychomotor Activity:Psychomotor Activity: Normal    Assets  Assets: Resilience   Sleep  Sleep:Sleep: Good     Physical Exam: Physical Exam Vitals and nursing note reviewed.  Constitutional:      General: She is not in acute distress.    Appearance: She is not ill-appearing or toxic-appearing.  HENT:     Head: Normocephalic and atraumatic.  Pulmonary:     Effort: Pulmonary effort is normal.  Neurological:     Mental Status: She is alert.    Review of Systems  Respiratory:  Negative for cough and shortness of breath.   Cardiovascular:  Negative for chest pain.  Gastrointestinal:  Negative for abdominal pain, constipation, diarrhea, nausea and vomiting.  Neurological:  Negative for dizziness, weakness and headaches.  Psychiatric/Behavioral:  Negative for depression, hallucinations and suicidal ideas. The patient is not nervous/anxious.    Blood pressure (!) 150/92, pulse 76, temperature (!) 97.3 F (36.3 C), temperature source Oral, resp. rate 18, height 5' 6 (1.676 m), weight 80.7 kg, SpO2 98%. Body mass index is 28.71 kg/m.   Treatment Plan Summary: Daily contact with patient to assess and evaluate symptoms and progress in treatment and Medication management  Allison Bolton is a 53 yr old female who presented on 02/17/24 to [facility] ED with a chief complaint of anxiety. In the ED, she was found to be acutely psychotic with tangential speech, agitation, and delusional thought content. Patient was admitted involuntarily to [facility] on 8/14.  PPHx is significant for Substance-Induced Psychosis, Bipolar I Disorder, Stimulant Use Disorder, GAD, Suicide Attempt (2007), and Prior Psychiatric Hospitalizations (last- [facility] 01/2024).   She is up walking around the unit more but continues to have minimal thought content.  She is reporting improvement in her urinary symptoms.   We will continue to monitor.   Substance-induced psychosis  Bipolar disorder: -Continue Haldol  10 mg q12 for psychosis and mood stability -Continue Geodon  60 mg BID for psychosis and mood stability -Continue Depakote  DR 750 mg q12 for mood stability -Continue Ativan  0.5 mg QHS or agitation  -Continue Cogentin  1 mg BID for Drug Induced EPS -Continue Agitation Protocol: Zyprexa    Presumed UTI: -Continue Nitrofurantoin  100 mg BID for 5 days (Day 4 of 5)   Dementia in other diseases classified elsewhere, unspecified severity, without behavioral disturbance, psychotic disturbance, mood disturbance, and anxiety: Jadee was given a MOCA on 9/17 and scored an 8.  [facility] SW is seeking a guardian for the patient as she needs 24 hour assistance at this point and they are looking for placement at either SNF or group home.     Nicotine  Dependence: -Continue Nicotine  Gum 2 mg PRN   -Continue Zofran  4 mg q8 PRN nausea/vomiting -Continue PRN's: Tylenol , Maalox, Atarax , Milk of Magnesia, Trazodone    --  The risks/benefits/side-effects/alternatives to medications were discussed in detail with the patient and time was given for questions. The patient consents to medication trials.                -- Metabolic profile and EKG monitoring obtained while  on an atypical antipsychotic (BMI: 28.73 Lipid Panel: WNL except Trig: 180 HbgA1c: 5.1)              -- Encouraged patient to participate in unit milieu and in scheduled group therapies              -- Short Term Goals: Ability to identify changes in lifestyle to reduce recurrence of condition will improve, Ability to verbalize feelings will improve, Ability to disclose and discuss suicidal ideas, Ability to demonstrate self-control will improve, Ability to identify and develop effective coping behaviors will improve, Ability to maintain clinical measurements within normal limits will improve, Compliance with prescribed medications will improve, and Ability  to identify triggers associated with substance abuse/mental health issues will improve             -- Long Term Goals: Improvement in symptoms so as ready for discharge   Safety and Monitoring:             -- Involuntary admission to inpatient psychiatric unit for safety, stabilization and treatment             -- Daily contact with patient to assess and evaluate symptoms and progress in treatment             -- Patient's case to be discussed in multi-disciplinary team meeting             -- Observation Level : q15 minute checks             -- Vital signs:  q12 hours             -- Precautions: suicide, elopement, and assault  Discharge Planning:              -- Social work and case management to assist with discharge planning and identification of hospital follow-up needs prior to discharge             -- Estimated LOS: 7+ more days             -- Discharge Concerns: Need to establish a safety plan; Medication compliance and effectiveness             -- Discharge Goals: Return home with outpatient referrals for mental health follow-up including medication management/psychotherapy   Allison GORMAN Rosser, DO 04/04/2024, 4:10 PM

## 2024-04-04 NOTE — Progress Notes (Signed)
   04/03/24 0745  Psych Admission Type (Psych Patients Only)  Admission Status Involuntary  Psychosocial Assessment  Patient Complaints None  Eye Contact Fair  Facial Expression Animated  Affect Preoccupied  Speech Soft  Interaction Childlike  Motor Activity Slow  Appearance/Hygiene In scrubs  Behavior Characteristics Cooperative  Mood Pleasant  Thought Process  Coherency Disorganized  Content Preoccupation  Delusions None reported or observed  Perception WDL  Hallucination None reported or observed  Judgment Poor  Confusion Mild  Danger to Self  Current suicidal ideation? Denies  Agreement Not to Harm Self Yes  Description of Agreement verbal  Danger to Others  Danger to Others None reported or observed

## 2024-04-04 NOTE — Progress Notes (Signed)
 Pt reports eating most of her lunch in the cafeteria

## 2024-04-04 NOTE — Progress Notes (Signed)
Pt consumed 50 percent of breakfast.

## 2024-04-04 NOTE — Progress Notes (Signed)
(  Sleep Hours) -3.25 (Any PRNs that were needed, meds refused, or side effects to meds)- Atarax  (Any disturbances and when (visitation, over night)-Pt had sleep disturbances, One time order of trazodone  was ordered   (Concerns raised by the patient)-  (SI/HI/AVH)-denied

## 2024-04-04 NOTE — Progress Notes (Signed)
   04/04/24 0914  Psych Admission Type (Psych Patients Only)  Admission Status Involuntary  Psychosocial Assessment  Patient Complaints None  Eye Contact Fair  Facial Expression Animated  Affect Preoccupied  Speech Soft  Interaction Childlike  Motor Activity Slow  Appearance/Hygiene Disheveled  Behavior Characteristics Cooperative  Mood Preoccupied;Pleasant  Thought Process  Coherency Disorganized  Content Preoccupation  Delusions None reported or observed  Perception Derealization  Hallucination None reported or observed  Judgment Poor  Confusion Mild  Danger to Self  Current suicidal ideation? Denies  Agreement Not to Harm Self Yes  Description of Agreement verbal  Danger to Others  Danger to Others None reported or observed

## 2024-04-04 NOTE — Progress Notes (Signed)
 Pt ate all of her dinner this evening.

## 2024-04-04 NOTE — [facility] Group Notes (Signed)
 Adult Psychoeducational Group Note  Date:  04/04/2024 Time:  9:57 AM  Group Topic/Focus:  Goals Group:   The focus of this group is to help patients establish daily goals to achieve during treatment and discuss how the patient can incorporate goal setting into their daily lives to aide in recovery. Orientation:   The focus of this group is to educate the patient on the purpose and policies of crisis stabilization and provide a format to answer questions about their admission.  The group details unit policies and expectations of patients while admitted.  Participation Level:  Did Not Attend  Participation Quality:    Affect:    Cognitive:    Insight:   Engagement in Group:    Modes of Intervention:    Additional Comments:    Allison Bolton 04/04/2024, 9:57 AM

## 2024-04-04 NOTE — Plan of Care (Signed)
   Problem: Education: Goal: Emotional status will improve Outcome: Progressing   Problem: Activity: Goal: Interest or engagement in activities will improve Outcome: Progressing

## 2024-04-04 NOTE — Progress Notes (Signed)
 Pt reports eating breakfast, lunch and dinner

## 2024-04-05 DIAGNOSIS — F0280 Dementia in other diseases classified elsewhere without behavioral disturbance: Secondary | ICD-10-CM | POA: Diagnosis not present

## 2024-04-05 DIAGNOSIS — F312 Bipolar disorder, current episode manic severe with psychotic features: Secondary | ICD-10-CM | POA: Diagnosis not present

## 2024-04-05 NOTE — Progress Notes (Addendum)
 Collateral contact   [facility] [address] [place], [place] [address] [phone]  CSW left a voicemail, asking about the application process.   [facility] [address] [place], [place] [address] [phone]  CSW was informed that they offer memory care but the patient would need to be diagnosed with full Alzheimer's or dementia.   [facility] [address] [place], [place] [address] [phone]  CSW left a voicemail.   [facility] [address], [place], [place] [address] [phone]  CSW was advised to call in an hour.   [facility] [address] [place], [place] [address] [phone]  There are no beds available at this time.   [facility] [place], [place]) [phone]  Donzell asked for the [facility].  Patient signed the ROI.  CSW faxed the document to [phone].     [facility] [address], [place], [place] [address] [phone]  Currently, the only available beds are available for patients covered under the [employer] Waiver.   Izetta Roses ([facility] [employer] Social Worker) [phone]   CSW left a voicemail.   Javan Gonzaga, LCSWA 04/05/2024

## 2024-04-05 NOTE — Group Note (Signed)
 Recreation Therapy Group Note   Group Topic:Coping Skills  Group Date: 04/05/2024 Start Time: 1005 End Time: 1100 Facilitators: Lazaria Schaben-McCall, LRT,CTRS Location: 500 Hall Dayroom   Group Topic: Coping Skills  Goal Area(s) Addresses:  Patient will successfully define what a coping skill is. Patient will acknowledge current strategies used in terms of healthy vs unhealthy. Patient will create a visual display of at least 5 positive coping skills. Patient will successfully identify benefit of using outlined coping skills post d/c.  Behavioral Response: Observed   Intervention: Art Poster- scissors, glue, magazines, printed chart, music  Activity: Patients were asked to fill in a coping skills chart using images and words found from magazines. As a group, patients were prompted to discuss what coping skills are, when they need to be utilized, and the importance of selection based on various triggers. Coping skills on the chart were identified by 5 categories - Diversion, Social, Cognitive, Tension Releasers, and Physical. LRT requested that patients represent at least 2 coping skills per category. Patients were then asked to present their collage-style artwork to the group.   Education: Pharmacologist, Scientist, physiological, Discharge Planning.   Education Outcome: Acknowledges education/In group clarification offered/Needs additional education.    Affect/Mood: Happy   Participation Level: None   Participation Quality: None   Behavior: On-looking   Speech/Thought Process: Disorganized   Insight: None   Judgement: None   Modes of Intervention: Art   Patient Response to Interventions:  Challenging    Education Outcome:  In group clarification offered    Clinical Observations/Individualized Feedback: Pt was happy but didn't complete activity. Pt got supplies for the activity but didn't do it. Pt speech was also hard to understand, most of it was random thoughts. Pt was  more observant of peers as they completed the activity.      Plan: Continue to engage patient in RT group sessions 2-3x/week.   Fabrizzio Marcella-McCall, LRT,CTRS 04/05/2024 11:49 AM

## 2024-04-05 NOTE — [facility] Group Notes (Signed)
 [facility] Group Notes:  (Nursing/MHT/Case Management/Adjunct)  Date:  04/05/2024  Time:  8:42 PM  Type of Therapy:  Wrap-up group  Participation Level:  Minimal  Participation Quality:  Appropriate  Affect:  Appropriate  Cognitive:  Appropriate  Insight:  Appropriate  Engagement in Group:  Engaged  Modes of Intervention:  Education  Summary of Progress/Problems: Pt reports she had a good day. No goal reported.   Allison Bolton 04/05/2024, 8:42 PM

## 2024-04-05 NOTE — Plan of Care (Signed)
  Problem: Education: Goal: Knowledge of [facility] Education information/materials will improve Outcome: Progressing Goal: Emotional status will improve Outcome: Progressing Goal: Mental status will improve Outcome: Progressing Goal: Verbalization of understanding the information provided will improve Outcome: Progressing   Problem: Activity: Goal: Interest or engagement in activities will improve Outcome: Progressing Goal: Sleeping patterns will improve Outcome: Progressing   Problem: Coping: Goal: Ability to verbalize frustrations and anger appropriately will improve Outcome: Progressing Goal: Ability to demonstrate self-control will improve Outcome: Progressing   Problem: Health Behavior/Discharge Planning: Goal: Identification of resources available to assist in meeting health care needs will improve Outcome: Progressing Goal: Compliance with treatment plan for underlying cause of condition will improve Outcome: Progressing   Problem: Physical Regulation: Goal: Ability to maintain clinical measurements within normal limits will improve Outcome: Progressing   Problem: Safety: Goal: Periods of time without injury will increase Outcome: Progressing   Problem: Education: Goal: Knowledge of [place] [facility] Education information/materials will improve Outcome: Progressing Goal: Emotional status will improve Outcome: Progressing Goal: Mental status will improve Outcome: Progressing Goal: Verbalization of understanding the information provided will improve Outcome: Progressing   Problem: Activity: Goal: Interest or engagement in activities will improve Outcome: Progressing Goal: Sleeping patterns will improve Outcome: Progressing   Problem: Coping: Goal: Ability to verbalize frustrations and anger appropriately will improve Outcome: Progressing Goal: Ability to demonstrate self-control will improve Outcome: Progressing   Problem: Health  Behavior/Discharge Planning: Goal: Identification of resources available to assist in meeting health care needs will improve Outcome: Progressing Goal: Compliance with treatment plan for underlying cause of condition will improve Outcome: Progressing   Problem: Physical Regulation: Goal: Ability to maintain clinical measurements within normal limits will improve Outcome: Progressing   Problem: Safety: Goal: Periods of time without injury will increase Outcome: Progressing   Problem: Activity: Goal: Will identify at least one activity in which they can participate Outcome: Progressing   Problem: Coping: Goal: Ability to identify and develop effective coping behavior will improve Outcome: Progressing Goal: Ability to interact with others will improve Outcome: Progressing Goal: Demonstration of participation in decision-making regarding own care will improve Outcome: Progressing Goal: Ability to use eye contact when communicating with others will improve Outcome: Progressing   Problem: Health Behavior/Discharge Planning: Goal: Identification of resources available to assist in meeting health care needs will improve Outcome: Progressing   Problem: Self-Concept: Goal: Will verbalize positive feelings about self Outcome: Progressing   Problem: Activity: Goal: Will verbalize the importance of balancing activity with adequate rest periods Outcome: Progressing   Problem: Education: Goal: Will be free of psychotic symptoms Outcome: Progressing Goal: Knowledge of the prescribed therapeutic regimen will improve Outcome: Progressing   Problem: Coping: Goal: Coping ability will improve Outcome: Progressing Goal: Will verbalize feelings Outcome: Progressing   Problem: Health Behavior/Discharge Planning: Goal: Compliance with prescribed medication regimen will improve Outcome: Progressing   Problem: Nutritional: Goal: Ability to achieve adequate nutritional intake will  improve Outcome: Progressing   Problem: Role Relationship: Goal: Ability to communicate needs accurately will improve Outcome: Progressing Goal: Ability to interact with others will improve Outcome: Progressing   Problem: Safety: Goal: Ability to redirect hostility and anger into socially appropriate behaviors will improve Outcome: Progressing Goal: Ability to remain free from injury will improve Outcome: Progressing   Problem: Self-Care: Goal: Ability to participate in self-care as condition permits will improve Outcome: Progressing   Problem: Self-Concept: Goal: Will verbalize positive feelings about self Outcome: Progressing

## 2024-04-05 NOTE — Progress Notes (Signed)
 [facility] MD Progress Note  04/05/2024 8:29 AM Allison Bolton  MRN:  [id number]  Principal Problem: Bipolar affective disorder, most recent episode manic with psychotic symptoms (HCC)     Major Neurocognitive Disorder due to multiple etiologies  Diagnosis: Principal Problem:   Bipolar affective disorder, current episode manic with psychotic symptoms (HCC)  Major Neurocognitive Disorder due to multiple etiologies    Total Time spent with patient:  I personally spent 25 minutes on the unit in direct patient care. The direct patient care time included face-to-face time with the patient, reviewing the patient's chart, communicating with other professionals, and coordinating care.   Identifying Information and brief psychiatric history:  Allison Bolton is a 53 yr old female with working psychiatric diagnoses of bipolar 1 disorder and major neurocognitive disorder. 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 She does have a history of prior psychiatric hospitalizations with substance use driving psychotic symptoms, however on this admission has presented with clear manic and psychotic symptoms that cleared with time and medications, however underlying neurocognitive deficits have remained. She has one prior suicide Attempt (2007), and Prior Psychiatric Hospitalizations (last- [facility] 01/2024).  On this admission she presented to [facility] ED on 02/17/24  with a chief complaint of anxiety. In the ED, she was found to be acutely psychotic with tangential speech, agitation, and delusional thought content. Patient was admitted involuntarily to [facility] on 8/14. Since admission the patient has been notably unresponsive to antipsychotic medications, largely related to poor self-care appearing disheveled and requiring direction to do ADLs. Although initially this was concerning for lingering psychotic symptoms, several OT evaluations have noted significant cognitive deficits and  poor self care is now thought to be related to major neurocognitive disorder. Level 2 has been done with plan for transfer to SNF vs memory care center.    Interval events:  Patient had no behavioral concerns, continues to be calm and medication compliant. Pleasant but confused and childlike per NS. No PRNs required for behaviors. Slep 9.25 hrs overnight.    Interview today:  Today the patient reports she is good! Seen walking through the mileu, smiling. Denies all concerns including SI, HI and AVH. States she sleep well and is eating well. No additional information volunteered today.    Past Medical History:  Past Medical History:  Diagnosis Date   Anxiety    Arthritis    Asthma    Bipolar 1 disorder (HCC)    Diverticulosis    Dysrhythmia    sts I have heart palpitations   Fibromyalgia    H/O hiatal hernia    4   Headache(784.0)    High blood pressure    Meniere's disease    S/P colonoscopy    Dr. Golda 2010: few small diverticula at sigmoid. otherwise normal.    Shortness of breath     Past Surgical History:  Procedure Laterality Date   ABDOMINAL HYSTERECTOMY     APPENDECTOMY     CESAREAN SECTION     CHOLECYSTECTOMY     COLONOSCOPY N/A 12/25/2023   Procedure: COLONOSCOPY;  Surgeon: Eartha Angelia Sieving, MD;  Location: [facility];  Service: Gastroenterology;  Laterality: N/A;  11:30am, asa 1   exploratory laparoscopy     FOOT SURGERY     HEMORRHOID SURGERY     LAPAROSCOPIC APPENDECTOMY  02/19/2011   Procedure: APPENDECTOMY LAPAROSCOPIC;  Surgeon: Oneil DELENA Budge;  Location: [facility] ORS;  Service: General;  Laterality: N/A;   LAPAROSCOPY  02/19/2011   Procedure: LAPAROSCOPY DIAGNOSTIC;  Surgeon: Oneil DELENA Budge;  Location: [facility] ORS;  Service: General;  Laterality: N/A;   left ovarian removal     multiple hernia repairs     Right ovarian removal     TONSILLECTOMY     TONSILLECTOMY AND ADENOIDECTOMY     tubes in ears     UMBILICAL HERNIA REPAIR  Dec 2011   Dr.  Budge   wisdom teeth removal     Family History:  Family History  Problem Relation Age of Onset   Thyroid  disease Mother    Colon cancer Father    Breast cancer Maternal Aunt    Colon cancer Brother    Colon cancer Other        paternal and maternal grandfather   Meniere's disease Other    Anesthesia problems Neg Hx    Hypotension Neg Hx    Malignant hyperthermia Neg Hx    Pseudochol deficiency Neg Hx    Family Psychiatric  History:  None Reported   Social History:  Social History   Substance and Sexual Activity  Alcohol Use No   Comment: not since June 2012     Social History   Substance and Sexual Activity  Drug Use Yes   Types: Marijuana   Comment: patient denies any-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    Social History   Socioeconomic History   Marital status: Legally Separated    Spouse name: Not on file   Number of children: Not on file   Years of education: Not on file   Highest education level: Not on file  Occupational History   Not on file  Tobacco Use   Smoking status: Every Day    Current packs/day: 0.00    Average packs/day: 0.5 packs/day for 37.3 years (18.7 ttl pk-yrs)    Types: Cigarettes    Start date: 07/07/1981    Last attempt to quit: 11/05/2018    Years since quitting: 5.4   Smokeless tobacco: Never  Vaping Use   Vaping status: Never Used  Substance and Sexual Activity   Alcohol use: No    Comment: not since June 2012   Drug use: Yes    Types: Marijuana    Comment: patient denies any-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   Sexual activity: Never    Birth control/protection: Surgical  Other Topics Concern   Not on file  Social History Narrative   Not on file   Social Drivers of Health   Financial Resource Strain: Not on file  Food Insecurity: Food Insecurity Present (02/18/2024)   Hunger Vital Sign    Worried About Running Out of Food in the Last Year: Sometimes true    Ran Out of Food in the Last Year: Sometimes  true  Transportation Needs: Unmet Transportation Needs (02/18/2024)   PRAPARE - Administrator, Civil Service (Medical): Yes    Lack of Transportation (Non-Medical): Yes  Physical Activity: Not on file  Stress: Not on file  Social Connections: Unknown (10/14/2021)   Received from [facility]   Social Connections    Do your friends and family support you?: Not on file    What agencies support you?: Not on file    Current Medications: Current Facility-Administered Medications  Medication Dose Route Frequency Provider Last Rate Last Admin   acetaminophen  (TYLENOL ) tablet 650 mg  650 mg Oral Q6H PRN White, Wyline CROME, NP  650 mg at 03/31/24 1342   alum & mag hydroxide-simeth (MAALOX/MYLANTA) 200-200-20 MG/5ML suspension 30 mL  30 mL Oral Q4H PRN White, Patrice L, NP   30 mL at 03/24/24 0217   benztropine  (COGENTIN ) tablet 1 mg  1 mg Oral BID White, Patrice L, NP   1 mg at 04/05/24 0819   divalproex  (DEPAKOTE ) DR tablet 750 mg  750 mg Oral Q12H Ji, Andrew, MD   750 mg at 04/05/24 0819   haloperidol  (HALDOL ) tablet 10 mg  10 mg Oral Q12H Ji, Andrew, MD   10 mg at 04/05/24 9180   hydrOXYzine  (ATARAX ) tablet 25 mg  25 mg Oral TID PRN White, Patrice L, NP   25 mg at 04/04/24 2048   LORazepam  (ATIVAN ) tablet 0.5 mg  0.5 mg Oral QHS Marry Clamp, MD   0.5 mg at 04/04/24 2048   magnesium  hydroxide (MILK OF MAGNESIA) suspension 30 mL  30 mL Oral Daily PRN White, Patrice L, NP       nicotine  polacrilex (NICORETTE ) gum 2 mg  2 mg Oral PRN Trudy Carwin, NP   2 mg at 03/28/24 1716   nitrofurantoin  (macrocrystal-monohydrate) (MACROBID ) capsule 100 mg  100 mg Oral Q12H Pashayan, Alexander S, DO   100 mg at 04/04/24 2048   OLANZapine  (ZYPREXA ) injection 10 mg  10 mg Intramuscular TID PRN White, Patrice L, NP       OLANZapine  (ZYPREXA ) injection 5 mg  5 mg Intramuscular TID PRN White, Patrice L, NP       OLANZapine  zydis (ZYPREXA ) disintegrating tablet 5 mg  5 mg Oral TID PRN  White, Patrice L, NP   5 mg at 04/02/24 1011   ondansetron  (ZOFRAN -ODT) disintegrating tablet 4 mg  4 mg Oral Q8H PRN Lynnette Barter, MD   4 mg at 04/02/24 1946   traZODone  (DESYREL ) tablet 50 mg  50 mg Oral QHS White, Patrice L, NP   50 mg at 04/04/24 2048   ziprasidone  (GEODON ) capsule 60 mg  60 mg Oral BID WC Marry Clamp, MD   60 mg at 04/05/24 9180    Lab Results:  No results found for this or any previous visit (from the past 48 hours).   Blood Alcohol level:  Lab Results  Component Value Date   [facility] <15 02/17/2024   ETH <15 01/14/2024    Metabolic Disorder Labs: Lab Results  Component Value Date   HGBA1C 5.1 01/18/2024   MPG 99.67 01/18/2024   MPG 105.41 05/11/2019   Lab Results  Component Value Date   PROLACTIN 24.1 (H) 05/11/2019   Lab Results  Component Value Date   CHOL 154 01/18/2024   TRIG 180 (H) 01/18/2024   HDL 49 01/18/2024   CHOLHDL 3.1 01/18/2024   VLDL 36 01/18/2024   LDLCALC 69 01/18/2024   LDLCALC 82 05/11/2019    Mental Status exam: Appearance: white female of slightly elevated BMI, improved hygiene this morning, seen walking in the milieu  Eye contact: good  Attitude towards examiner  cooperative, but remains with limited ability to engage due to cognitive deficits  Psychomotor: no agitation or retardation Speech: reduced amount, infrequent one-word responses  Language: simplistic  Mood: good Affect: was largely euthymic today, smiling  Thought content: denying SI, HI, not overtly expressing delusional content Thought Process: remains confused and concrete, childlike  Perception: denying AVH, not overtly RTIS Insight: poor  Judgement: poor   Orientation: to self  Attention/Concentration: limited due to psychosis  Memory/Cognition: poor - patient's most recent  MOCA on 9/17 was an 8 with significant impairments in attention and memory.  Fund of Knowledge: below average     Musculoskeletal: Strength & Muscle Tone: within normal  limits Gait & Station: normal Patient leans: N/A    Physical Exam Constitutional:      Appearance: the patient is not toxic-appearing.  Pulmonary:     Effort: Pulmonary effort is normal.  Neurological:     General: No focal deficit present.     Oriented to self and hospital only, does not verbalize date or situation HEENT:     Eyes EOMI   Review of Systems  Respiratory:  Negative for shortness of breath.   Cardiovascular:  Negative for chest pain.  Gastrointestinal:  Negative for abdominal pain, constipation, diarrhea, nausea and vomiting.  Neurological:  Negative for headaches.   Blood pressure (!) 130/98, pulse 83, temperature (!) 97.5 F (36.4 C), temperature source Oral, resp. rate 18, height 5' 6 (1.676 m), weight 80.7 kg, SpO2 97%. Body mass index is 28.71 kg/m.   Treatment Plan Summary: Daily contact with patient to assess and evaluate symptoms and progress in treatment and Medication management  Assessment Allison Bolton is a 53 yr old female who presented on 02/17/24 to [facility] ED with a chief complaint of anxiety. In the ED, she was found to be acutely psychotic with tangential speech, agitation, and delusional thought content. Patient was admitted involuntarily to [facility] on 8/14.  Although substance-induced psychosis has previously been documented, she additional presents with prior reported manic episodes and a bipolar 1 diagnosis has been most reflective of this. On this admission, presented with agitation and increased rate of speech, initially had significantly poor sleep and psychotic symptoms, disorganization of thoughts, behavior, concern for AVH.  After approximately 1 week manic symptoms had largely resolved with antipsychotic medications and mood stabilizers however she remained with disorganization in thoughts and behavior and poor self care initially concerning for ongoing psychosis but after extended observation appears more in line with underlying  major neurocognitive disorder due to multiple etiologies. She has significant cognitive impairments with most recent MOCA scoring at 8 and recommendation for 24 hr supervision and help/prompting ADLs. She has been noted to be childlike in her interactions and requires direction to eat and shower but is otherwise pleasant and cooperative with care with no overt psychotic symptoms. Suspect multifactorial causes of cognitive impairment including longstanding substance use, recurrent manic/psychotic episodes, possible underlying low functioning (unknown).   Over the last 2 weeks the patient has been pleasant, denying AVH, somewhat more engaged in exam and with better eye contact. She has not been seen to be RTIS or overtly expressing delusional content. Presentation remains reflective of treated bipolar disorder and ongoing neurocognitive impairments. At this time we will pursue discharge and recommendation for SNF vs group home and level 2 has been filled out  Today the patient was pleasant and smiling in the milieu, denying all concerns. Did sleep over 9 hrs last night and given no recent episodes of agitation will discontinue evening ativan . All other medications continued unchanged.   DSM-5 diagnoses: Bipolar 1 Disorder, most recent episode manic, severe, with psychotic features    -currently resolved: no active symptoms of mania or psychosis, just cognitive deficits 2.  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 History of substance induced psychotic disorder   Plan:  Psychiatric medications:  -Continue Haldol  10 mg q12 for psychosis and mood stability -Continue Geodon  60 mg bid -Continue Depakote  DR 750  mg q12 for mood stability -Continue Cogentin  1 mg BID for Drug Induced EPS -Continue Agitation Protocol: Zyprexa   -discontinue nightly ativan  - no longer agitated and sleeping over 8 hrs consistently.   Nicotine  Dependence: -Continue Nicotine  Gum 2 mg PRN  Additional  PRNs -Continue Zofran  4 mg q8 PRN nausea/vomiting -Continue PRN's: Tylenol , Maalox, Atarax , Milk of Magnesia, Trazodone    --  The risks/benefits/side-effects/alternatives to medications were discussed in detail with the patient and time was given for questions. The patient consents to medication trials.                -- Metabolic profile and EKG monitoring obtained while on an atypical antipsychotic (BMI: 28.73 Lipid Panel: WNL except Trig: 180 HbgA1c: 5.1)              -- Encouraged patient to participate in unit milieu and in scheduled group therapies              -- Short Term Goals: Ability to identify changes in lifestyle to reduce recurrence of condition will improve, Ability to verbalize feelings will improve, Ability to disclose and discuss suicidal ideas, Ability to demonstrate self-control will improve, Ability to identify and develop effective coping behaviors will improve, Ability to maintain clinical measurements within normal limits will improve, Compliance with prescribed medications will improve, and Ability to identify triggers associated with substance abuse/mental health issues will improve             -- Long Term Goals: Improvement in symptoms so as ready for discharge   Safety and Monitoring:             -- Involuntary admission to inpatient psychiatric unit for safety, stabilization and treatment             -- Daily contact with patient to assess and evaluate symptoms and progress in treatment             -- Patient's case to be discussed in multi-disciplinary team meeting             -- Observation Level : q15 minute checks             -- Vital signs:  q12 hours             -- Precautions: suicide, elopement, and assault  Discharge Planning:             -appreciate SW assistance in discharging  [facility] SW now involved and will be getting the patient a guardian and assisting in finding placement. Level 2 filled out .    Allison LOISE Arts, MD 04/05/2024, 8:29 AM

## 2024-04-05 NOTE — Plan of Care (Signed)
   Problem: Education: Goal: Emotional status will improve Outcome: Progressing Goal: Mental status will improve Outcome: Progressing Goal: Verbalization of understanding the information provided will improve Outcome: Progressing

## 2024-04-05 NOTE — Progress Notes (Signed)
 Patient reports needing something to help her relax. PRN hydrOXYzine  25 mg given

## 2024-04-05 NOTE — Progress Notes (Signed)
 Type of Note:  IVC Court Hearing     Patient participated in the court hearing.   The IVC has been approved for an additional 14 days.     Allison Bolton 04/05/2024

## 2024-04-05 NOTE — Group Note (Addendum)
 LCSW Group Therapy Note   Group Date: 04/04/2024 Start Time: 1300 End Time: 1400   Participation:  did not attend   Type of Therapy:  Group Therapy  Topic:  Healing Hearts:  A Safe Space for Grief  Objective:  The objective of this class, Healing Hearts: A Safe Space for Grief, is to create a compassionate environment where participants can process their grief, explore different stages of grief, and discover ways to honor their loved ones through personal rituals.  3 Goals:  Provide a safe and supportive space where participants  feel comfortable sharing their feelings and experiences of grief without judgment. 2.  Educate participants about the stages of grief and emphasize that there is no right way to grieve or a fixed timeline for healing. 3. Introduce the concept of rituals as a means to process grief, allowing individuals to honor their loved ones in a personal and meaningful way.  Summary:  In Healing Hearts: A Safe Space for Grief, we explored the unique and personal journey of grief, emphasizing that everyone experiences it differently. We discussed the five stages of grief (denial, anger, bargaining, depression, and acceptance), with the understanding that grief is not linear. Rituals were introduced as a way to help cope with loss, offering comfort and connection through meaningful actions such as lighting candles or taking memory walks. Participants were encouraged to express their emotions, focus on self-care, and reflect on moments of gratitude for their loved ones, recognizing that healing is a process and there is no timeline for grief.  Therapeutic Modalities: - Elements of CBT:  Cognitive Restructuring - Elements of DBT:  Distress Tolerance, Emotion Regulation   Hitomi Slape O Giovanna Kemmerer, LCSWA 04/04/2024

## 2024-04-05 NOTE — Progress Notes (Signed)
(  Sleep Hours) -9.25 (Any PRNs that were needed, meds refused, or side effects to meds)- Atarax  (Any disturbances and when (visitation, over night)-none (Concerns raised by the patient)- none (SI/HI/AVH)-Denied

## 2024-04-06 ENCOUNTER — Encounter (HOSPITAL_COMMUNITY): Payer: Self-pay

## 2024-04-06 DIAGNOSIS — F312 Bipolar disorder, current episode manic severe with psychotic features: Secondary | ICD-10-CM | POA: Diagnosis not present

## 2024-04-06 DIAGNOSIS — F0280 Dementia in other diseases classified elsewhere without behavioral disturbance: Secondary | ICD-10-CM | POA: Diagnosis not present

## 2024-04-06 NOTE — Progress Notes (Signed)
 [facility] MD Progress Note  04/06/2024 9:31 AM Allison Bolton  MRN:  [id number]  Principal Problem: Bipolar affective disorder, most recent episode manic with psychotic symptoms (HCC)     Major Neurocognitive Disorder due to multiple etiologies  Diagnosis: Principal Problem:   Bipolar affective disorder, current episode manic with psychotic symptoms (HCC)  Major Neurocognitive Disorder due to multiple etiologies    Total Time spent with patient:  I personally spent 25 minutes on the unit in direct patient care. The direct patient care time included face-to-face time with the patient, reviewing the patient's chart, communicating with other professionals, and coordinating care.   Identifying Information and brief psychiatric history:  Allison Bolton is a 53 yr old female with working psychiatric diagnoses of bipolar 1 disorder and major neurocognitive disorder. 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 She does have a history of prior psychiatric hospitalizations with substance use driving psychotic symptoms, however on this admission has presented with clear manic and psychotic symptoms that cleared with time and medications, however underlying neurocognitive deficits have remained. She has one prior suicide Attempt (2007), and Prior Psychiatric Hospitalizations (last- [facility] 01/2024).  On this admission she presented to [facility] ED on 02/17/24  with a chief complaint of anxiety. In the ED, she was found to be acutely psychotic with tangential speech, agitation, and delusional thought content. Patient was admitted involuntarily to [facility] on 8/14. Since admission the patient has been notably unresponsive to antipsychotic medications, largely related to poor self-care appearing disheveled and requiring direction to do ADLs. Although initially this was concerning for lingering psychotic symptoms, several OT evaluations have noted significant cognitive deficits and  poor self care is now thought to be related to major neurocognitive disorder. Level 2 has been done with plan for transfer to SNF vs memory care center.    Interval events:  Patient had no behavioral concerns, no PRNS for agitation, calm and medication compliant. Pleasant but confused and childlike per NS. Attending groups with limited ability to engage. Slept 10 hrs overnight.  IVC renewal done and approved for another 14 days.   Interview today:  Today the patient reports she is good, smiles and waves. Today worries about her check and her car. Denies SI, HI and AVH. Reports good sleep and appetite. Reports feeling safe. No other concerns voiced.    Past Medical History:  Past Medical History:  Diagnosis Date   Anxiety    Arthritis    Asthma    Bipolar 1 disorder (HCC)    Diverticulosis    Dysrhythmia    sts I have heart palpitations   Fibromyalgia    H/O hiatal hernia    4   Headache(784.0)    High blood pressure    Meniere's disease    S/P colonoscopy    Dr. Golda 2010: few small diverticula at sigmoid. otherwise normal.    Shortness of breath     Past Surgical History:  Procedure Laterality Date   ABDOMINAL HYSTERECTOMY     APPENDECTOMY     CESAREAN SECTION     CHOLECYSTECTOMY     COLONOSCOPY N/A 12/25/2023   Procedure: COLONOSCOPY;  Surgeon: Eartha Angelia Sieving, MD;  Location: [facility];  Service: Gastroenterology;  Laterality: N/A;  11:30am, asa 1   exploratory laparoscopy     FOOT SURGERY     HEMORRHOID SURGERY     LAPAROSCOPIC APPENDECTOMY  02/19/2011   Procedure: APPENDECTOMY LAPAROSCOPIC;  Surgeon: Oneil DELENA Budge;  Location: [facility] ORS;  Service: General;  Laterality: N/A;   LAPAROSCOPY  02/19/2011   Procedure: LAPAROSCOPY DIAGNOSTIC;  Surgeon: Oneil DELENA Budge;  Location: [facility] ORS;  Service: General;  Laterality: N/A;   left ovarian removal     multiple hernia repairs     Right ovarian removal     TONSILLECTOMY     TONSILLECTOMY AND ADENOIDECTOMY      tubes in ears     UMBILICAL HERNIA REPAIR  Dec 2011   Dr. Budge   wisdom teeth removal     Family History:  Family History  Problem Relation Age of Onset   Thyroid  disease Mother    Colon cancer Father    Breast cancer Maternal Aunt    Colon cancer Brother    Colon cancer Other        paternal and maternal grandfather   Meniere's disease Other    Anesthesia problems Neg Hx    Hypotension Neg Hx    Malignant hyperthermia Neg Hx    Pseudochol deficiency Neg Hx    Family Psychiatric  History:  None Reported   Social History:  Social History   Substance and Sexual Activity  Alcohol Use No   Comment: not since June 2012     Social History   Substance and Sexual Activity  Drug Use Yes   Types: Marijuana   Comment: patient denies any-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    Social History   Socioeconomic History   Marital status: Legally Separated    Spouse name: Not on file   Number of children: Not on file   Years of education: Not on file   Highest education level: Not on file  Occupational History   Not on file  Tobacco Use   Smoking status: Every Day    Current packs/day: 0.00    Average packs/day: 0.5 packs/day for 37.3 years (18.7 ttl pk-yrs)    Types: Cigarettes    Start date: 07/07/1981    Last attempt to quit: 11/05/2018    Years since quitting: 5.4   Smokeless tobacco: Never  Vaping Use   Vaping status: Never Used  Substance and Sexual Activity   Alcohol use: No    Comment: not since June 2012   Drug use: Yes    Types: Marijuana    Comment: patient denies any-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   Sexual activity: Never    Birth control/protection: Surgical  Other Topics Concern   Not on file  Social History Narrative   Not on file   Social Drivers of Health   Financial Resource Strain: Not on file  Food Insecurity: Food Insecurity Present (02/18/2024)   Hunger Vital Sign    Worried About Running Out of Food in the Last Year:  Sometimes true    Ran Out of Food in the Last Year: Sometimes true  Transportation Needs: Unmet Transportation Needs (02/18/2024)   PRAPARE - Administrator, Civil Service (Medical): Yes    Lack of Transportation (Non-Medical): Yes  Physical Activity: Not on file  Stress: Not on file  Social Connections: Unknown (10/14/2021)   Received from [facility]   Social Connections    Do your friends and family support you?: Not on file    What agencies support you?: Not on file    Current Medications: Current Facility-Administered Medications  Medication Dose Route Frequency Provider Last Rate Last Admin   acetaminophen  (TYLENOL ) tablet  650 mg  650 mg Oral Q6H PRN White, Patrice L, NP   650 mg at 03/31/24 1342   alum & mag hydroxide-simeth (MAALOX/MYLANTA) 200-200-20 MG/5ML suspension 30 mL  30 mL Oral Q4H PRN White, Patrice L, NP   30 mL at 03/24/24 0217   benztropine  (COGENTIN ) tablet 1 mg  1 mg Oral BID White, Patrice L, NP   1 mg at 04/06/24 0816   divalproex  (DEPAKOTE ) DR tablet 750 mg  750 mg Oral Q12H Ji, Andrew, MD   750 mg at 04/06/24 9183   haloperidol  (HALDOL ) tablet 10 mg  10 mg Oral Q12H Ji, Andrew, MD   10 mg at 04/06/24 9183   hydrOXYzine  (ATARAX ) tablet 25 mg  25 mg Oral TID PRN White, Patrice L, NP   25 mg at 04/05/24 1533   magnesium  hydroxide (MILK OF MAGNESIA) suspension 30 mL  30 mL Oral Daily PRN White, Patrice L, NP       nicotine  polacrilex (NICORETTE ) gum 2 mg  2 mg Oral PRN Trudy Carwin, NP   2 mg at 03/28/24 1716   OLANZapine  (ZYPREXA ) injection 10 mg  10 mg Intramuscular TID PRN White, Patrice L, NP       OLANZapine  (ZYPREXA ) injection 5 mg  5 mg Intramuscular TID PRN White, Patrice L, NP       OLANZapine  zydis (ZYPREXA ) disintegrating tablet 5 mg  5 mg Oral TID PRN White, Patrice L, NP   5 mg at 04/02/24 1011   ondansetron  (ZOFRAN -ODT) disintegrating tablet 4 mg  4 mg Oral Q8H PRN Lynnette Barter, MD   4 mg at 04/02/24 1946   traZODone   (DESYREL ) tablet 50 mg  50 mg Oral QHS White, Patrice L, NP   50 mg at 04/05/24 1942   ziprasidone  (GEODON ) capsule 60 mg  60 mg Oral BID WC Marry Clamp, MD   60 mg at 04/06/24 9183    Lab Results:  No results found for this or any previous visit (from the past 48 hours).   Blood Alcohol level:  Lab Results  Component Value Date   [facility] <15 02/17/2024   ETH <15 01/14/2024    Metabolic Disorder Labs: Lab Results  Component Value Date   HGBA1C 5.1 01/18/2024   MPG 99.67 01/18/2024   MPG 105.41 05/11/2019   Lab Results  Component Value Date   PROLACTIN 24.1 (H) 05/11/2019   Lab Results  Component Value Date   CHOL 154 01/18/2024   TRIG 180 (H) 01/18/2024   HDL 49 01/18/2024   CHOLHDL 3.1 01/18/2024   VLDL 36 01/18/2024   LDLCALC 69 01/18/2024   LDLCALC 82 05/11/2019    Mental Status exam: Appearance: white female of slightly elevated BMI, fair hygiene, poor dentition seen sitting up in bed Eye contact: good  Attitude towards examiner  cooperative, limited ability to engage Psychomotor: no agitation or retardation Speech: reduced amount, infrequent one-word responses  Language: simplistic  Mood: good Affect: was euthymic today, smiling brightly Thought content: denying SI, HI, not overtly expressing delusional content Thought Process: remains confused and concrete, childlike  Perception: denying AVH, not overtly RTIS Insight: poor  Judgement: poor   Orientation: to self  Attention/Concentration: limited due to psychosis  Memory/Cognition: poor - patient's most recent MOCA on 9/17 was an 8 with significant impairments in attention and memory.  Fund of Knowledge: below average     Musculoskeletal: Strength & Muscle Tone: within normal limits Gait & Station: normal Patient leans: N/A    Physical  Exam Constitutional:      Appearance: the patient is not toxic-appearing.  Pulmonary:     Effort: Pulmonary effort is normal.  Neurological:     General: No  focal deficit present.     Oriented to self and hospital only, does not verbalize date or situation HEENT:     Eyes EOMI   Review of Systems  Respiratory:  Negative for shortness of breath.   Cardiovascular:  Negative for chest pain.  Gastrointestinal:  Negative for abdominal pain, constipation, diarrhea, nausea and vomiting.  Neurological:  Negative for headaches.   Blood pressure 113/82, pulse 83, temperature (!) 97.5 F (36.4 C), temperature source Oral, resp. rate 18, height 5' 6 (1.676 m), weight 80.7 kg, SpO2 97%. Body mass index is 28.71 kg/m.   Treatment Plan Summary: Daily contact with patient to assess and evaluate symptoms and progress in treatment and Medication management  Assessment RAJANAE MANTIA is a 53 yr old female who presented on 02/17/24 to [facility] ED with a chief complaint of anxiety. In the ED, she was found to be acutely psychotic with tangential speech, agitation, and delusional thought content. Patient was admitted involuntarily to [facility] on 8/14.  Although substance-induced psychosis has previously been documented, she additional presents with prior reported manic episodes and a bipolar 1 diagnosis has been most reflective of this. On this admission, presented with agitation and increased rate of speech, initially had significantly poor sleep and psychotic symptoms, disorganization of thoughts, behavior, concern for AVH.  After approximately 1 week manic symptoms had largely resolved with antipsychotic medications and mood stabilizers however she remained with disorganization in thoughts and behavior and poor self care initially concerning for ongoing psychosis but after extended observation appears more in line with underlying major neurocognitive disorder due to multiple etiologies. She has significant cognitive impairments with most recent MOCA scoring at 8 and recommendation for 24 hr supervision and help/prompting ADLs. She has been noted to be childlike  in her interactions and requires direction to eat and shower but is otherwise pleasant and cooperative with care with no overt psychotic symptoms. Suspect multifactorial causes of cognitive impairment including longstanding substance use, recurrent manic/psychotic episodes, possible underlying low functioning (unknown).   Over the last 2 weeks the patient has been pleasant, denying AVH, somewhat more engaged in exam and with better eye contact. She has not been seen to be RTIS or overtly expressing delusional content. Presentation remains reflective of treated bipolar disorder and ongoing neurocognitive impairments. At this time we will pursue discharge and recommendation for SNF vs group home and level 2 has been filled out  Today the patient was engaged in interview, alert and smiling. Remained childlike in he interactions but denying all concerns. Slept 10 hrs overnight even after discontinuing the ativan .    DSM-5 diagnoses: Bipolar 1 Disorder, most recent episode manic, severe, with psychotic features    -currently resolved: no active symptoms of mania or psychosis, just cognitive deficits 2.  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 History of substance induced psychotic disorder   Plan: Legal status: IVC - renewed 9/30 for an additional 14 days  Psychiatric medications:  -Continue Haldol  10 mg q12 for psychosis and mood stability -Continue Geodon  60 mg bid -Continue Depakote  DR 750 mg q12 for mood stability -Continue Cogentin  1 mg BID for Drug Induced EPS -Continue Agitation Protocol: Zyprexa     Nicotine  Dependence: -Continue Nicotine  Gum 2 mg PRN  Additional PRNs -Continue Zofran  4 mg q8 PRN nausea/vomiting -  Continue PRN's: Tylenol , Maalox, Atarax , Milk of Magnesia, Trazodone    --  The risks/benefits/side-effects/alternatives to medications were discussed in detail with the patient and time was given for questions. The patient consents to medication  trials.                -- Metabolic profile and EKG monitoring obtained while on an atypical antipsychotic (BMI: 28.73 Lipid Panel: WNL except Trig: 180 HbgA1c: 5.1)              -- Encouraged patient to participate in unit milieu and in scheduled group therapies              -- Short Term Goals: Ability to identify changes in lifestyle to reduce recurrence of condition will improve, Ability to verbalize feelings will improve, Ability to disclose and discuss suicidal ideas, Ability to demonstrate self-control will improve, Ability to identify and develop effective coping behaviors will improve, Ability to maintain clinical measurements within normal limits will improve, Compliance with prescribed medications will improve, and Ability to identify triggers associated with substance abuse/mental health issues will improve             -- Long Term Goals: Improvement in symptoms so as ready for discharge   Safety and Monitoring:             -- Involuntary admission to inpatient psychiatric unit for safety, stabilization and treatment             -- Daily contact with patient to assess and evaluate symptoms and progress in treatment             -- Patient's case to be discussed in multi-disciplinary team meeting             -- Observation Level : q15 minute checks             -- Vital signs:  q12 hours             -- Precautions: suicide, elopement, and assault  Discharge Planning:             -appreciate SW assistance in discharging  [facility] SW now involved and will be getting the patient a guardian and assisting in finding placement. Level 2 filled out .    Leita LOISE Arts, MD 04/06/2024, 9:31 AM

## 2024-04-06 NOTE — Plan of Care (Signed)
  Problem: Education: Goal: Emotional status will improve Outcome: Progressing   Problem: Activity: Goal: Interest or engagement in activities will improve Outcome: Progressing Goal: Sleeping patterns will improve Outcome: Progressing

## 2024-04-06 NOTE — [facility] Group Notes (Signed)
 [facility] Group Notes:  (Nursing/MHT/Case Management/Adjunct)  Date:  04/06/2024  Time:  8:38 PM  Type of Therapy:  Wrap-up group  Participation Level:  Active  Participation Quality:  Appropriate  Affect:  Appropriate  Cognitive:  Appropriate  Insight:  Appropriate  Engagement in Group:  Engaged  Modes of Intervention:  Education  Summary of Progress/Problems: PT goal to get D/C. Rated day 5/10.  Allison Bolton 04/06/2024, 8:38 PM

## 2024-04-06 NOTE — Progress Notes (Addendum)
 [facility] [place], [place]) [phone]  10:00 AM - Donzell informed that she received the [facility] and will review it.  3:45 PM - Virtual interview has been scheduled for Thursday, 10/2 at 9 AM.  Anita's email address is [email or web address].  Donzell requested TB test:  QuantiFERON-TB Gold Test.  Patient would have a roommate, woman her age.  If everything goes well, in-person interview was tentatively scheduled for Monday, 10/6.  Placement will be available in a week or two.     [facility] [place]  [phone]  CSW asked what type of insurance this is, and was informed that this is Medicare Advantage Plan.   Izetta Roses ([facility] [employer] Social Worker) [phone]   10:21 AM - Izetta left a voicemail for CSW.   12:34 PM - CSW left a Engineer, technical sales for [employer].   Annelies Coyt, LCSWA 04/06/2024

## 2024-04-06 NOTE — Progress Notes (Signed)
(  Sleep Hours) - 10hrs (Any PRNs that were needed, meds refused, or side effects to meds)- none (Any disturbances and when (visitation, over night)- none (Concerns raised by the patient)- none  (SI/HI/AVH)- denies
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
## 2024-04-06 NOTE — Group Note (Signed)
 Recreation Therapy Group Note   Group Topic:Communication  Group Date: 04/06/2024 Start Time: 1010 End Time: 1050 Facilitators: Jesenya Bowditch-McCall, LRT,CTRS Location: [address] Dayroom   Group Topic: Communication, Problem Solving   Goal Area(s) Addresses:  Patient will effectively listen to complete activity.  Patient will identify communication skills used to make activity successful.  Patient will identify how skills used during activity can be used to reach post d/c goals.    Behavioral Response: Minimal  Intervention: Building surveyor Activity - Geometric pattern cards, pencils, blank paper    Activity: Geometric Drawings.  Three volunteers from the peer group will be shown an abstract picture with a particular arrangement of geometrical shapes.  Each round, one 'speaker' will describe the pattern, as accurately as possible without revealing the image to the group.  The remaining group members will listen and draw the picture to reflect how it is described to them. Patients with the role of 'listener' cannot ask clarifying questions but, may request that the speaker repeat a direction. Once the drawings are complete, the presenter will show the rest of the group the picture and compare how close each person came to drawing the picture. LRT will facilitate a post-activity discussion regarding effective communication and the importance of planning, listening, and asking for clarification in daily interactions with others.  Education: Environmental consultant, Active listening, Support systems, Discharge planning  Education Outcome: Acknowledges understanding/In group clarification offered/Needs additional education.    Affect/Mood: Happy   Participation Level: Minimal   Participation Quality: Independent   Behavior: Attentive    Speech/Thought Process: Disorganized   Insight: Lacking   Judgement: Lacking    Modes of Intervention: Activity   Patient Response to  Interventions:  Receptive   Education Outcome:  In group clarification offered    Clinical Observations/Individualized Feedback: Pt was attentive during group. Pt made up her own drawing during activity and unable to follow instructions. Pt was pleasant during group but would make random comments or repeat what someone else said during group. Pt wasn't disruptive but appropriate in her behavior during group.      Plan: Continue to engage patient in RT group sessions 2-3x/week.   Tyton Abdallah-McCall, LRT,CTRS 04/06/2024 12:18 PM

## 2024-04-06 NOTE — Plan of Care (Signed)
   Problem: Education: Goal: Knowledge of [facility] Education information/materials will improve Outcome: Progressing Goal: Emotional status will improve Outcome: Progressing Goal: Mental status will improve Outcome: Progressing Goal: Verbalization of understanding the information provided will improve Outcome: Progressing

## 2024-04-06 NOTE — Progress Notes (Signed)
   04/06/24 0800  Psych Admission Type (Psych Patients Only)  Admission Status Involuntary  Psychosocial Assessment  Patient Complaints None  Eye Contact Fair  Facial Expression Animated  Affect Preoccupied  Speech Soft  Interaction Childlike  Motor Activity Slow  Appearance/Hygiene In scrubs  Behavior Characteristics Cooperative;Appropriate to situation  Mood Pleasant  Thought Process  Coherency Disorganized  Content Preoccupation  Delusions None reported or observed  Perception Depersonalization  Hallucination None reported or observed  Judgment Poor  Confusion Mild  Danger to Self  Current suicidal ideation? Denies  Description of Suicide Plan No Plan  Agreement Not to Harm Self Yes  Description of Agreement Verbal  Danger to Others  Danger to Others None reported or observed

## 2024-04-07 DIAGNOSIS — F0280 Dementia in other diseases classified elsewhere without behavioral disturbance: Secondary | ICD-10-CM | POA: Diagnosis not present

## 2024-04-07 DIAGNOSIS — F312 Bipolar disorder, current episode manic severe with psychotic features: Secondary | ICD-10-CM | POA: Diagnosis not present

## 2024-04-07 MED ORDER — TUBERCULIN PPD 5 UNIT/0.1ML ID SOLN
5.0000 [IU] | Freq: Once | INTRADERMAL | Status: AC
  Administered 2024-04-07: 5 [IU] via INTRADERMAL
  Filled 2024-04-07: qty 0.1

## 2024-04-07 NOTE — Progress Notes (Signed)
(  Sleep Hours) - 9.25 (Any PRNs that were needed, meds refused, or side effects to meds)- Zyprexa  (Any disturbances and when (visitation, over night)- none (Concerns raised by the patient)-  (SI/HI/AVH)- denies

## 2024-04-07 NOTE — Plan of Care (Signed)

## 2024-04-07 NOTE — Progress Notes (Signed)
   04/07/24 0939  Psych Admission Type (Psych Patients Only)  Admission Status Involuntary  Psychosocial Assessment  Patient Complaints None  Eye Contact Fair  Facial Expression Animated  Affect Preoccupied  Speech Soft  Interaction Childlike  Motor Activity Slow  Appearance/Hygiene Unremarkable  Behavior Characteristics Cooperative;Appropriate to situation  Mood Pleasant  Thought Process  Coherency Disorganized  Content Preoccupation  Delusions None reported or observed  Perception Depersonalization  Hallucination None reported or observed  Judgment Poor  Confusion Mild  Danger to Self  Agreement Not to Harm Self Yes  Description of Agreement verbal  Danger to Others  Danger to Others None reported or observed   Pt spoke with SW and group home representative.   New orders obtained.  Per Group Home they will do a face to face interview on Monday.

## 2024-04-07 NOTE — Progress Notes (Addendum)
 Collateral contact   [facility] [place], [place]) [phone] [email or web address]  Patient participated in the virtual interview with Donzell from [facility].  The interview went well and in-person interview was scheduled for Monday, 10/6 at 11:30 AM.     Izetta Roses ([facility] Adult Protective Services Social Worker) [phone], [email or web address] Izetta said they are in the process of obtaining guardianship.  She allowed to share her contact information with the group home.   CSW Water engineer via secure email the FL2, H&P, medication list and Facesheet.     Ahmir Bracken, LCSWA 04/07/2024

## 2024-04-07 NOTE — Plan of Care (Signed)
   Problem: Education: Goal: Knowledge of [facility] Education information/materials will improve Outcome: Progressing Goal: Emotional status will improve Outcome: Progressing Goal: Mental status will improve Outcome: Progressing Goal: Verbalization of understanding the information provided will improve Outcome: Progressing

## 2024-04-07 NOTE — Group Note (Signed)
 Recreation Therapy Group Note   Group Topic:Health and Wellness  Group Date: 04/07/2024 Start Time: 1000 End Time: 1030 Facilitators: Kaytlynne Neace-McCall, LRT,CTRS Location: [address] Dayroom  Group Topic: Wellness  Goal Area(s) Addresses:  Patient will define components of whole wellness. Patient will verbalize benefit of whole wellness.  Behavioral Response: Minimal  Intervention: Music, Chairs  Activity: LRT and patients dicussed the elements of wellness and why they are important. The group also dicussed some of the things that hinder individuals from focusing on their wellness. LRT and patients then completed a session of exercises. Patients took turns leading the group in the exercises of their choosing. Patients were encouraged to do what their bodies allowed, take breaks or get water  if needed.  Education: Wellness, Building control surveyor.   Education Outcome: Acknowledges education/In group clarification offered/Needs additional education.    Affect/Mood: Happy   Participation Level: Minimal   Participation Quality: Independent   Behavior: Interactive    Speech/Thought Process: Irrational   Insight: Lacking   Judgement: Lacking    Modes of Intervention: Music   Patient Response to Interventions:  Receptive   Education Outcome:  In group clarification offered    Clinical Observations/Individualized Feedback: Pt came in and out of group a few times. When pt was in group, peers would encourage her and make her feel involved with the activity. Pt would engage for short periods of time and did attempt to lead the group in an exercise. Pt would also make comments that had nothing to do with the activity like asking for fried chicken. Pt was bright and happy during group.      Plan: Continue to engage patient in RT group sessions 2-3x/week.   Aaren Krog-McCall, LRT,CTRS  04/07/2024 12:01 PM

## 2024-04-07 NOTE — Progress Notes (Signed)
 [facility] MD Progress Note  04/07/2024 9:01 AM Allison Bolton  MRN:  [id number]  Principal Problem: Bipolar affective disorder, most recent episode manic with psychotic symptoms (HCC)     Major Neurocognitive Disorder due to multiple etiologies  Diagnosis: Principal Problem:   Bipolar affective disorder, current episode manic with psychotic symptoms (HCC)  Major Neurocognitive Disorder due to multiple etiologies    Total Time spent with patient:  I personally spent 25 minutes on the unit in direct patient care. The direct patient care time included face-to-face time with the patient, reviewing the patient's chart, communicating with other professionals, and coordinating care.   Identifying Information and brief psychiatric history:  Allison Bolton is a 53 yr old female with working psychiatric diagnoses of bipolar 1 disorder and major neurocognitive disorder. 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 She does have a history of prior psychiatric hospitalizations with substance use driving psychotic symptoms, however on this admission has presented with clear manic and psychotic symptoms that cleared with time and medications, however underlying neurocognitive deficits have remained. She has one prior suicide Attempt (2007), and Prior Psychiatric Hospitalizations (last- [facility] 01/2024).  On this admission she presented to [facility] ED on 02/17/24  with a chief complaint of anxiety. In the ED, she was found to be acutely psychotic with tangential speech, agitation, and delusional thought content. Patient was admitted involuntarily to [facility] on 8/14. Since admission the patient has been notably unresponsive to antipsychotic medications, largely related to poor self-care appearing disheveled and requiring direction to do ADLs. Although initially this was concerning for lingering psychotic symptoms, several OT evaluations have noted significant cognitive deficits and  poor self care is now thought to be related to major neurocognitive disorder. Level 2 has been done with plan for transfer to SNF vs memory care center.    Interval events:  Interview scheduled for today at 9 am with [facility] for group home placement. TB-quantiferon Gold ordered at their request. Otherwise nursing staff continue to document no behavioral concerns but ongoing childlike communication and poor ability to engage. Denying all concerns    Interview today:  Today the patient reports she is good. When asked if the meeting went well today she states yes and reports yes to asking if she feels comfortable moving to the placement. Denies all concerns including SI, HI and AVH. Requests to sleep some more    Past Medical History:  Past Medical History:  Diagnosis Date   Anxiety    Arthritis    Asthma    Bipolar 1 disorder (HCC)    Diverticulosis    Dysrhythmia    sts I have heart palpitations   Fibromyalgia    H/O hiatal hernia    4   Headache(784.0)    High blood pressure    Meniere's disease    S/P colonoscopy    Dr. Golda 2010: few small diverticula at sigmoid. otherwise normal.    Shortness of breath     Past Surgical History:  Procedure Laterality Date   ABDOMINAL HYSTERECTOMY     APPENDECTOMY     CESAREAN SECTION     CHOLECYSTECTOMY     COLONOSCOPY N/A 12/25/2023   Procedure: COLONOSCOPY;  Surgeon: Eartha Angelia Sieving, MD;  Location: [facility];  Service: Gastroenterology;  Laterality: N/A;  11:30am, asa 1   exploratory laparoscopy     FOOT SURGERY     HEMORRHOID SURGERY     LAPAROSCOPIC APPENDECTOMY  02/19/2011  Procedure: APPENDECTOMY LAPAROSCOPIC;  Surgeon: Oneil DELENA Budge;  Location: [facility];  Service: General;  Laterality: N/A;   LAPAROSCOPY  02/19/2011   Procedure: LAPAROSCOPY DIAGNOSTIC;  Surgeon: Oneil DELENA Budge;  Location: [facility] OR[facility];  Service: General;  Laterality: N/A;   left ovarian removal     multiple hernia repairs     Right ovarian  removal     TONSILLECTOMY     TONSILLECTOMY AND ADENOIDECTOMY     tubes in ears     UMBILICAL HERNIA REPAIR  Dec 2011   Dr. Budge   wisdom teeth removal     Family History:  Family History  Problem Relation Age of Onset   Thyroid  disease Mother    Colon cancer Father    Breast cancer Maternal Aunt    Colon cancer Brother    Colon cancer Other        paternal and maternal grandfather   Meniere's disease Other    Anesthesia problems Neg Hx    Hypotension Neg Hx    Malignant hyperthermia Neg Hx    Pseudochol deficiency Neg Hx    Family Psychiatric  History:  None Reported   Social History:  Social History   Substance and Sexual Activity  Alcohol Use No   Comment: not since June 2012     Social History   Substance and Sexual Activity  Drug Use Yes   Types: Marijuana   Comment: patient denies any-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    Social History   Socioeconomic History   Marital status: Legally Separated    Spouse name: Not on file   Number of children: Not on file   Years of education: Not on file   Highest education level: Not on file  Occupational History   Not on file  Tobacco Use   Smoking status: Every Day    Current packs/day: 0.00    Average packs/day: 0.5 packs/day for 37.3 years (18.7 ttl pk-yrs)    Types: Cigarettes    Start date: 07/07/1981    Last attempt to quit: 11/05/2018    Years since quitting: 5.4   Smokeless tobacco: Never  Vaping Use   Vaping status: Never Used  Substance and Sexual Activity   Alcohol use: No    Comment: not since June 2012   Drug use: Yes    Types: Marijuana    Comment: patient denies any-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   Sexual activity: Never    Birth control/protection: Surgical  Other Topics Concern   Not on file  Social History Narrative   Not on file   Social Drivers of Health   Financial Resource Strain: Not on file  Food Insecurity: Food Insecurity Present (02/18/2024)   Hunger  Vital Sign    Worried About Running Out of Food in the Last Year: Sometimes true    Ran Out of Food in the Last Year: Sometimes true  Transportation Needs: Unmet Transportation Needs (02/18/2024)   PRAPARE - Administrator, Civil Service (Medical): Yes    Lack of Transportation (Non-Medical): Yes  Physical Activity: Not on file  Stress: Not on file  Social Connections: Unknown (10/14/2021)   Received from [facility]   Social Connections    Do your friends and family support you?: Not on file    What agencies support you?: Not on file    Current Medications: Current Facility-Administered Medications  Medication Dose Route Frequency Provider  Last Rate Last Admin   acetaminophen  (TYLENOL ) tablet 650 mg  650 mg Oral Q6H PRN White, Patrice L, NP   650 mg at 03/31/24 1342   alum & mag hydroxide-simeth (MAALOX/MYLANTA) 200-200-20 MG/5ML suspension 30 mL  30 mL Oral Q4H PRN White, Patrice L, NP   30 mL at 03/24/24 0217   benztropine  (COGENTIN ) tablet 1 mg  1 mg Oral BID White, Patrice L, NP   1 mg at 04/07/24 0805   divalproex  (DEPAKOTE ) DR tablet 750 mg  750 mg Oral Q12H Ji, Andrew, MD   750 mg at 04/07/24 0805   haloperidol  (HALDOL ) tablet 10 mg  10 mg Oral Q12H Ji, Andrew, MD   10 mg at 04/07/24 0805   hydrOXYzine  (ATARAX ) tablet 25 mg  25 mg Oral TID PRN White, Patrice L, NP   25 mg at 04/05/24 1533   magnesium  hydroxide (MILK OF MAGNESIA) suspension 30 mL  30 mL Oral Daily PRN White, Patrice L, NP       nicotine  polacrilex (NICORETTE ) gum 2 mg  2 mg Oral PRN Trudy Carwin, NP   2 mg at 03/28/24 1716   OLANZapine  (ZYPREXA ) injection 10 mg  10 mg Intramuscular TID PRN White, Patrice L, NP       OLANZapine  (ZYPREXA ) injection 5 mg  5 mg Intramuscular TID PRN White, Patrice L, NP       OLANZapine  zydis (ZYPREXA ) disintegrating tablet 5 mg  5 mg Oral TID PRN White, Patrice L, NP   5 mg at 04/06/24 2128   ondansetron  (ZOFRAN -ODT) disintegrating tablet 4 mg  4 mg Oral  Q8H PRN Lynnette Barter, MD   4 mg at 04/02/24 1946   traZODone  (DESYREL ) tablet 50 mg  50 mg Oral QHS White, Patrice L, NP   50 mg at 04/06/24 2035   ziprasidone  (GEODON ) capsule 60 mg  60 mg Oral BID WC Marry Clamp, MD   60 mg at 04/07/24 0805    Lab Results:  No results found for this or any previous visit (from the past 48 hours).   Blood Alcohol level:  Lab Results  Component Value Date   [facility] <15 02/17/2024   ETH <15 01/14/2024    Metabolic Disorder Labs: Lab Results  Component Value Date   HGBA1C 5.1 01/18/2024   MPG 99.67 01/18/2024   MPG 105.41 05/11/2019   Lab Results  Component Value Date   PROLACTIN 24.1 (H) 05/11/2019   Lab Results  Component Value Date   CHOL 154 01/18/2024   TRIG 180 (H) 01/18/2024   HDL 49 01/18/2024   CHOLHDL 3.1 01/18/2024   VLDL 36 01/18/2024   LDLCALC 69 01/18/2024   LDLCALC 82 05/11/2019    Mental Status exam: Appearance: white female of slightly elevated BMI, fair hygiene, poor dentition seen reclining in bed Eye contact: good  Attitude towards examiner  cooperative, limited ability to engage Psychomotor: no agitation or retardation Speech: reduced amount, infrequent one-word responses  Language: simplistic  Mood: good Affect: was euthymic, smiling  Thought content: denying SI, HI, not overtly expressing delusional content Thought Process: remains confused and concrete, childlike  Perception: denying AVH, not overtly RTIS Insight: poor  Judgement: poor   Orientation: to self  Attention/Concentration: limited due to psychosis  Memory/Cognition: poor - patient's most recent MOCA on 9/17 was an 8 with significant impairments in attention and memory.  Fund of Knowledge: below average     Musculoskeletal: Strength & Muscle Tone: within normal limits Gait & Station: normal  Patient leans: N/A    Physical Exam Constitutional:      Appearance: the patient is not toxic-appearing.  Pulmonary:     Effort: Pulmonary  effort is normal.  Neurological:     General: No focal deficit present.     Oriented to self and hospital only, does not verbalize date or situation HEENT:     Eyes EOMI   Review of Systems  Respiratory:  Negative for shortness of breath.   Cardiovascular:  Negative for chest pain.  Gastrointestinal:  Negative for abdominal pain, constipation, diarrhea, nausea and vomiting.  Neurological:  Negative for headaches.   Blood pressure 120/80, pulse 75, temperature (!) 97.4 F (36.3 C), temperature source Oral, resp. rate 14, height 5' 6 (1.676 m), weight 80.7 kg, SpO2 100%. Body mass index is 28.71 kg/m.   Treatment Plan Summary: Daily contact with patient to assess and evaluate symptoms and progress in treatment and Medication management  Assessment Allison Bolton is a 53 yr old female who presented on 02/17/24 to [facility] ED with a chief complaint of anxiety. In the ED, she was found to be acutely psychotic with tangential speech, agitation, and delusional thought content. Patient was admitted involuntarily to [facility] on 8/14.  Although substance-induced psychosis has previously been documented, she additional presents with prior reported manic episodes and a bipolar 1 diagnosis has been most reflective of this. On this admission, presented with agitation and increased rate of speech, initially had significantly poor sleep and psychotic symptoms, disorganization of thoughts, behavior, concern for AVH.  After approximately 1 week manic symptoms had largely resolved with antipsychotic medications and mood stabilizers however she remained with disorganization in thoughts and behavior and poor self care initially concerning for ongoing psychosis but after extended observation appears more in line with underlying major neurocognitive disorder due to multiple etiologies. She has significant cognitive impairments with most recent MOCA scoring at 8 and recommendation for 24 hr supervision and  help/prompting ADLs. She has been noted to be childlike in her interactions and requires direction to eat and shower but is otherwise pleasant and cooperative with care with no overt psychotic symptoms. Suspect multifactorial causes of cognitive impairment including longstanding substance use, recurrent manic/psychotic episodes, possible underlying low functioning (unknown).   Over the last 2 weeks the patient has been pleasant, denying AVH, somewhat more engaged in exam and with better eye contact. She has not been seen to be RTIS or overtly expressing delusional content. Presentation remains reflective of treated bipolar disorder and ongoing neurocognitive impairments. At this time we will pursue discharge and recommendation for SNF vs group home and level 2 has been filled out - virtual meeting today from prospective group home, appeared to go well. In-person meeting Monday. Will order PPD to be given today so that individual meeting her can check for TB.    DSM-5 diagnoses: Bipolar 1 Disorder, most recent episode manic, severe, with psychotic features    -currently resolved: no active symptoms of mania or psychosis, just cognitive deficits 2.  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 History of substance induced psychotic disorder   Plan: Legal status: IVC - renewed 9/30 for an additional 14 days  Psychiatric medications:  -Continue Haldol  10 mg q12 for psychosis and mood stability -Continue Geodon  60 mg bid -Continue Depakote  DR 750 mg q12 for mood stability -Continue Cogentin  1 mg BID for Drug Induced EPS -Continue Agitation Protocol: Zyprexa    Nicotine  Dependence: -Continue Nicotine  Gum 2 mg PRN  Additional  PRNs -Continue Zofran  4 mg q8 PRN nausea/vomiting -Continue PRN's: Tylenol , Maalox, Atarax , Milk of Magnesia, Trazodone    --  The risks/benefits/side-effects/alternatives to medications were discussed in detail with the patient and time was given for  questions. The patient consents to medication trials.                -- Metabolic profile and EKG monitoring obtained while on an atypical antipsychotic (BMI: 28.73 Lipid Panel: WNL except Trig: 180 HbgA1c: 5.1)              -- Encouraged patient to participate in unit milieu and in scheduled group therapies              -- Short Term Goals: Ability to identify changes in lifestyle to reduce recurrence of condition will improve, Ability to verbalize feelings will improve, Ability to disclose and discuss suicidal ideas, Ability to demonstrate self-control will improve, Ability to identify and develop effective coping behaviors will improve, Ability to maintain clinical measurements within normal limits will improve, Compliance with prescribed medications will improve, and Ability to identify triggers associated with substance abuse/mental health issues will improve             -- Long Term Goals: Improvement in symptoms so as ready for discharge   Safety and Monitoring:             -- Involuntary admission to inpatient psychiatric unit for safety, stabilization and treatment             -- Daily contact with patient to assess and evaluate symptoms and progress in treatment             -- Patient's case to be discussed in multi-disciplinary team meeting             -- Observation Level : q15 minute checks             -- Vital signs:  q12 hours             -- Precautions: suicide, elopement, and assault  Discharge Planning:             -appreciate SW assistance in discharging  [facility] SW now involved and will be getting the patient a guardian and assisting in finding placement. Level 2 filled out .    Allison LOISE Arts, MD 04/07/2024, 9:01 AM

## 2024-04-07 NOTE — Group Note (Signed)
 Date:  04/07/2024 Time:  10:27 AM  Group Topic/Focus:  Goals Group:   The focus of this group is to help patients establish daily goals to achieve during treatment and discuss how the patient can incorporate goal setting into their daily lives to aide in recovery. Orientation:   The focus of this group is to educate the patient on the purpose and policies of crisis stabilization and provide a format to answer questions about their admission.  The group details unit policies and expectations of patients while admitted.    Participation Level:  Active  Participation Quality:  Appropriate  Affect:  Appropriate  Cognitive:  Appropriate  Insight: Improving  Engagement in Group:  Engaged  Modes of Intervention:  Discussion    Haylyn Halberg LITTIE [name] 04/07/2024, 10:27 AM

## 2024-04-08 DIAGNOSIS — F312 Bipolar disorder, current episode manic severe with psychotic features: Secondary | ICD-10-CM | POA: Diagnosis not present

## 2024-04-08 DIAGNOSIS — F0280 Dementia in other diseases classified elsewhere without behavioral disturbance: Secondary | ICD-10-CM | POA: Diagnosis not present

## 2024-04-08 NOTE — Progress Notes (Signed)
 Collateral contact - Izetta Roses ([employer] Adult Protective Services Social Worker) [phone], [email or web address]    Katie left a voicemail stating that patient will be served with interim guardianship and incompetency documents between today, Friday 10/3 and Monday, 04/11/2024.  A court hearing is scheduled for Tuesday, 04/12/2024.   Taurus Willis, LCSWA 04/08/2024

## 2024-04-08 NOTE — [facility] Group Notes (Signed)
 [facility] Group Notes:  (Nursing/MHT/Case Management/Adjunct)  Date:  04/08/2024  Time:  3:29 AM  Type of Therapy:  Wrap-up group  Participation Level:  Did Not Attend  Participation Quality:    Affect:    Cognitive:    Insight:    Engagement in Group:    Modes of Intervention:    Summary of Progress/Problems: PT refused to attend group.  Grayce LITTIE Essex 04/08/2024, 3:29 AM

## 2024-04-08 NOTE — Progress Notes (Signed)
   04/08/24 0100  Psych Admission Type (Psych Patients Only)  Admission Status Involuntary  Psychosocial Assessment  Patient Complaints None  Eye Contact Fair  Facial Expression Animated  Affect Preoccupied  Speech Soft  Interaction Childlike  Motor Activity Slow  Appearance/Hygiene Unremarkable  Behavior Characteristics Cooperative  Mood Pleasant  Aggressive Behavior  Effect No apparent injury  Thought Process  Coherency Disorganized  Content Preoccupation  Delusions None reported or observed  Perception Depersonalization  Hallucination None reported or observed  Judgment Poor  Confusion Moderate  Danger to Self  Current suicidal ideation? Denies

## 2024-04-08 NOTE — [facility] Suicide Risk Assessment (Signed)
 [facility] INPATIENT:  Family/Significant Other Suicide Prevention Education  Suicide Prevention Education:  Contact Attempts: [name] (54 year old daughter) [phone], (name of family member/significant other) has been identified by the patient as the family member/significant other with whom the patient will be residing, and identified as the person(s) who will aid the patient in the event of a mental health crisis.  With written consent from the patient, two attempts were made to provide suicide prevention education, prior to and/or following the patient's discharge.  We were unsuccessful in providing suicide prevention education.  A suicide education pamphlet was given to the patient to share with family/significant other.  Date and time of first attempt:  04/08/2024 / 1:42 PM  The mailbox was full so CSW was unable to leave a voicemail.   Date and time of second attempt:  second attempt needed.   Allison Bolton, LCSWA 04/08/2024, 1:41 PM

## 2024-04-08 NOTE — Progress Notes (Signed)
 Nursing Progress Note:    (Sleep Hours) - 8.75  (Any PRNs that were needed, meds refused, or side effects to meds)- Pt. Appeared anxious; Administered PRN Hydroxyzine  per MAR  (Any disturbances and when (visitation, over night)- None  (Concerns raised by the patient)- Pt. Reports, just hungry.  (SI/HI/AVH)- Denies

## 2024-04-08 NOTE — Plan of Care (Signed)
  Problem: Education: Goal: Knowledge of [facility] Education information/materials will improve Outcome: Progressing Goal: Emotional status will improve Outcome: Progressing Goal: Mental status will improve Outcome: Progressing Goal: Verbalization of understanding the information provided will improve Outcome: Progressing   Problem: Activity: Goal: Interest or engagement in activities will improve Outcome: Progressing Goal: Sleeping patterns will improve Outcome: Progressing   Problem: Coping: Goal: Ability to verbalize frustrations and anger appropriately will improve Outcome: Progressing Goal: Ability to demonstrate self-control will improve Outcome: Progressing   Problem: Health Behavior/Discharge Planning: Goal: Identification of resources available to assist in meeting health care needs will improve Outcome: Progressing Goal: Compliance with treatment plan for underlying cause of condition will improve Outcome: Progressing   Problem: Physical Regulation: Goal: Ability to maintain clinical measurements within normal limits will improve Outcome: Progressing   Problem: Safety: Goal: Periods of time without injury will increase Outcome: Progressing   Problem: Education: Goal: Knowledge of [place] [facility] Education information/materials will improve Outcome: Progressing Goal: Emotional status will improve Outcome: Progressing Goal: Mental status will improve Outcome: Progressing Goal: Verbalization of understanding the information provided will improve Outcome: Progressing   Problem: Activity: Goal: Interest or engagement in activities will improve Outcome: Progressing Goal: Sleeping patterns will improve Outcome: Progressing   Problem: Coping: Goal: Ability to verbalize frustrations and anger appropriately will improve Outcome: Progressing Goal: Ability to demonstrate self-control will improve Outcome: Progressing   Problem: Health  Behavior/Discharge Planning: Goal: Identification of resources available to assist in meeting health care needs will improve Outcome: Progressing Goal: Compliance with treatment plan for underlying cause of condition will improve Outcome: Progressing   Problem: Physical Regulation: Goal: Ability to maintain clinical measurements within normal limits will improve Outcome: Progressing   Problem: Safety: Goal: Periods of time without injury will increase Outcome: Progressing   Problem: Activity: Goal: Will identify at least one activity in which they can participate Outcome: Progressing   Problem: Coping: Goal: Ability to identify and develop effective coping behavior will improve Outcome: Progressing Goal: Ability to interact with others will improve Outcome: Progressing Goal: Demonstration of participation in decision-making regarding own care will improve Outcome: Progressing Goal: Ability to use eye contact when communicating with others will improve Outcome: Progressing   Problem: Health Behavior/Discharge Planning: Goal: Identification of resources available to assist in meeting health care needs will improve Outcome: Progressing   Problem: Self-Concept: Goal: Will verbalize positive feelings about self Outcome: Progressing   Problem: Activity: Goal: Will verbalize the importance of balancing activity with adequate rest periods Outcome: Progressing   Problem: Education: Goal: Will be free of psychotic symptoms Outcome: Progressing Goal: Knowledge of the prescribed therapeutic regimen will improve Outcome: Progressing   Problem: Coping: Goal: Coping ability will improve Outcome: Progressing Goal: Will verbalize feelings Outcome: Progressing   Problem: Health Behavior/Discharge Planning: Goal: Compliance with prescribed medication regimen will improve Outcome: Progressing   Problem: Nutritional: Goal: Ability to achieve adequate nutritional intake will  improve Outcome: Progressing   Problem: Role Relationship: Goal: Ability to communicate needs accurately will improve Outcome: Progressing Goal: Ability to interact with others will improve Outcome: Progressing   Problem: Safety: Goal: Ability to redirect hostility and anger into socially appropriate behaviors will improve Outcome: Progressing Goal: Ability to remain free from injury will improve Outcome: Progressing   Problem: Self-Care: Goal: Ability to participate in self-care as condition permits will improve Outcome: Progressing   Problem: Self-Concept: Goal: Will verbalize positive feelings about self Outcome: Progressing

## 2024-04-08 NOTE — Progress Notes (Signed)
   04/08/24 1127  Psych Admission Type (Psych Patients Only)  Admission Status Involuntary  Psychosocial Assessment  Patient Complaints None  Eye Contact Fair  Facial Expression Animated  Affect Preoccupied  Speech Soft  Interaction Childlike  Motor Activity Slow  Appearance/Hygiene Improved  Behavior Characteristics Cooperative  Mood Pleasant  Thought Process  Coherency Disorganized  Content Preoccupation  Delusions None reported or observed  Perception Depersonalization  Hallucination None reported or observed  Judgment Poor  Confusion Mild  Danger to Self  Current suicidal ideation? Denies  Agreement Not to Harm Self Yes  Description of Agreement verbal  Danger to Others  Danger to Others None reported or observed

## 2024-04-08 NOTE — Progress Notes (Signed)
   04/08/24 2044  Psych Admission Type (Psych Patients Only)  Admission Status Involuntary  Psychosocial Assessment  Patient Complaints None  Eye Contact Fair  Facial Expression Animated  Affect Preoccupied  Speech Soft  Interaction Childlike  Motor Activity Slow  Appearance/Hygiene Improved  Behavior Characteristics Cooperative  Mood Pleasant  Thought Process  Coherency Disorganized  Content Preoccupation  Delusions None reported or observed  Perception Depersonalization  Hallucination None reported or observed  Judgment Poor  Confusion Mild  Danger to Self  Current suicidal ideation? Denies  Danger to Others  Danger to Others None reported or observed

## 2024-04-08 NOTE — Progress Notes (Signed)
 [facility] MD Progress Note  04/08/2024 9:36 AM Allison Bolton  MRN:  [id number]  Principal Problem: Bipolar affective disorder, most recent episode manic with psychotic symptoms (HCC)     Major Neurocognitive Disorder due to multiple etiologies  Diagnosis: Principal Problem:   Bipolar affective disorder, current episode manic with psychotic symptoms (HCC)  Major Neurocognitive Disorder due to multiple etiologies    Total Time spent with patient:  I personally spent 25 minutes on the unit in direct patient care. The direct patient care time included face-to-face time with the patient, reviewing the patient's chart, communicating with other professionals, and coordinating care.   Identifying Information and brief psychiatric history:  Allison Bolton is a 53 yr old female with working psychiatric diagnoses of bipolar 1 disorder and major neurocognitive disorder. 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 She does have a history of prior psychiatric hospitalizations with substance use driving psychotic symptoms, however on this admission has presented with clear manic and psychotic symptoms that cleared with time and medications, however underlying neurocognitive deficits have remained. She has one prior suicide Attempt (2007), and Prior Psychiatric Hospitalizations (last- [facility] 01/2024).  On this admission she presented to [facility] ED on 02/17/24  with a chief complaint of anxiety. In the ED, she was found to be acutely psychotic with tangential speech, agitation, and delusional thought content. Patient was admitted involuntarily to [facility] on 8/14. Since admission the patient has been notably unresponsive to antipsychotic medications, largely related to poor self-care appearing disheveled and requiring direction to do ADLs. Although initially this was concerning for lingering psychotic symptoms, several OT evaluations have noted significant cognitive deficits and  poor self care is now thought to be related to major neurocognitive disorder. Level 2 has been done with plan for transfer to SNF vs memory care center.    Interval events:  Patient had interview virtually yesterday, which went well and with planned in-person interview on Monday. PPD was ordered for her to be read Monday. Otherwise continues to be pleasant but confused requiring redirection, no behavioral concerns. Medication compliant, denying SI, HI and AVH. Sleeping and eating well.   Interview today:  Today the patient reports she is good. Asks this dino what questions today? States the meeting went well and she is looking forward to next meeting on Monday. Does want to see her kids and mentions a grey[employer] bus. Denies depression, SI, HI or AVH. No other concerns today.   Past Medical History:  Past Medical History:  Diagnosis Date   Anxiety    Arthritis    Asthma    Bipolar 1 disorder (HCC)    Diverticulosis    Dysrhythmia    sts I have heart palpitations   Fibromyalgia    H/O hiatal hernia    4   Headache(784.0)    High blood pressure    Meniere's disease    S/P colonoscopy    Dr. Golda 2010: few small diverticula at sigmoid. otherwise normal.    Shortness of breath     Past Surgical History:  Procedure Laterality Date   ABDOMINAL HYSTERECTOMY     APPENDECTOMY     CESAREAN SECTION     CHOLECYSTECTOMY     COLONOSCOPY N/A 12/25/2023   Procedure: COLONOSCOPY;  Surgeon: Eartha Angelia Sieving, MD;  Location: [facility];  Service: Gastroenterology;  Laterality: N/A;  11:30am, asa 1   exploratory laparoscopy     FOOT SURGERY     HEMORRHOID SURGERY  LAPAROSCOPIC APPENDECTOMY  02/19/2011   Procedure: APPENDECTOMY LAPAROSCOPIC;  Surgeon: Oneil DELENA Budge;  Location: [facility] ORS;  Service: General;  Laterality: N/A;   LAPAROSCOPY  02/19/2011   Procedure: LAPAROSCOPY DIAGNOSTIC;  Surgeon: Oneil DELENA Budge;  Location: [facility] ORS;  Service: General;  Laterality: N/A;   left  ovarian removal     multiple hernia repairs     Right ovarian removal     TONSILLECTOMY     TONSILLECTOMY AND ADENOIDECTOMY     tubes in ears     UMBILICAL HERNIA REPAIR  Dec 2011   Dr. Budge   wisdom teeth removal     Family History:  Family History  Problem Relation Age of Onset   Thyroid  disease Mother    Colon cancer Father    Breast cancer Maternal Aunt    Colon cancer Brother    Colon cancer Other        paternal and maternal grandfather   Meniere's disease Other    Anesthesia problems Neg Hx    Hypotension Neg Hx    Malignant hyperthermia Neg Hx    Pseudochol deficiency Neg Hx    Family Psychiatric  History:  None Reported   Social History:  Social History   Substance and Sexual Activity  Alcohol Use No   Comment: not since June 2012     Social History   Substance and Sexual Activity  Drug Use Yes   Types: Marijuana   Comment: patient denies any-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    Social History   Socioeconomic History   Marital status: Legally Separated    Spouse name: Not on file   Number of children: Not on file   Years of education: Not on file   Highest education level: Not on file  Occupational History   Not on file  Tobacco Use   Smoking status: Every Day    Current packs/day: 0.00    Average packs/day: 0.5 packs/day for 37.3 years (18.7 ttl pk-yrs)    Types: Cigarettes    Start date: 07/07/1981    Last attempt to quit: 11/05/2018    Years since quitting: 5.4   Smokeless tobacco: Never  Vaping Use   Vaping status: Never Used  Substance and Sexual Activity   Alcohol use: No    Comment: not since June 2012   Drug use: Yes    Types: Marijuana    Comment: patient denies any-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   Sexual activity: Never    Birth control/protection: Surgical  Other Topics Concern   Not on file  Social History Narrative   Not on file   Social Drivers of Health   Financial Resource Strain: Not on file   Food Insecurity: Food Insecurity Present (02/18/2024)   Hunger Vital Sign    Worried About Running Out of Food in the Last Year: Sometimes true    Ran Out of Food in the Last Year: Sometimes true  Transportation Needs: Unmet Transportation Needs (02/18/2024)   PRAPARE - Administrator, Civil Service (Medical): Yes    Lack of Transportation (Non-Medical): Yes  Physical Activity: Not on file  Stress: Not on file  Social Connections: Unknown (10/14/2021)   Received from [facility]   Social Connections    Do your friends and family support you?: Not on file    What agencies support you?: Not on file    Current Medications: Current Facility-Administered Medications  Medication Dose Route Frequency Provider Last Rate Last Admin   acetaminophen  (TYLENOL ) tablet 650 mg  650 mg Oral Q6H PRN White, Patrice L, NP   650 mg at 03/31/24 1342   alum & mag hydroxide-simeth (MAALOX/MYLANTA) 200-200-20 MG/5ML suspension 30 mL  30 mL Oral Q4H PRN White, Patrice L, NP   30 mL at 03/24/24 0217   benztropine  (COGENTIN ) tablet 1 mg  1 mg Oral BID White, Patrice L, NP   1 mg at 04/08/24 0810   divalproex  (DEPAKOTE ) DR tablet 750 mg  750 mg Oral Q12H Ji, Andrew, MD   750 mg at 04/08/24 0810   haloperidol  (HALDOL ) tablet 10 mg  10 mg Oral Q12H Ji, Andrew, MD   10 mg at 04/08/24 9189   hydrOXYzine  (ATARAX ) tablet 25 mg  25 mg Oral TID PRN White, Patrice L, NP   25 mg at 04/07/24 2038   magnesium  hydroxide (MILK OF MAGNESIA) suspension 30 mL  30 mL Oral Daily PRN White, Patrice L, NP       nicotine  polacrilex (NICORETTE ) gum 2 mg  2 mg Oral PRN Trudy Carwin, NP   2 mg at 03/28/24 1716   OLANZapine  (ZYPREXA ) injection 10 mg  10 mg Intramuscular TID PRN White, Patrice L, NP       OLANZapine  (ZYPREXA ) injection 5 mg  5 mg Intramuscular TID PRN White, Patrice L, NP       OLANZapine  zydis (ZYPREXA ) disintegrating tablet 5 mg  5 mg Oral TID PRN White, Patrice L, NP   5 mg at 04/06/24 2128    ondansetron  (ZOFRAN -ODT) disintegrating tablet 4 mg  4 mg Oral Q8H PRN Lynnette Barter, MD   4 mg at 04/02/24 1946   traZODone  (DESYREL ) tablet 50 mg  50 mg Oral QHS White, Patrice L, NP   50 mg at 04/07/24 2037   tuberculin injection 5 Units  5 Units Intradermal Once Towana Leita SAILOR, MD   5 Units at 04/07/24 1315   ziprasidone  (GEODON ) capsule 60 mg  60 mg Oral BID WC Marry Clamp, MD   60 mg at 04/08/24 0809    Lab Results:  No results found for this or any previous visit (from the past 48 hours).   Blood Alcohol level:  Lab Results  Component Value Date   [facility] <15 02/17/2024   ETH <15 01/14/2024    Metabolic Disorder Labs: Lab Results  Component Value Date   HGBA1C 5.1 01/18/2024   MPG 99.67 01/18/2024   MPG 105.41 05/11/2019   Lab Results  Component Value Date   PROLACTIN 24.1 (H) 05/11/2019   Lab Results  Component Value Date   CHOL 154 01/18/2024   TRIG 180 (H) 01/18/2024   HDL 49 01/18/2024   CHOLHDL 3.1 01/18/2024   VLDL 36 01/18/2024   LDLCALC 69 01/18/2024   LDLCALC 82 05/11/2019    Mental Status exam: Appearance: white female of slightly elevated BMI, fair hygiene, poor dentition seen reclining in bed Eye contact: good  Attitude towards examiner  cooperative, limited ability to engage Psychomotor: no agitation or retardation Speech: reduced amount, infrequent one-word responses  Language: simplistic  Mood: good Affect: was euthymic, smiling  Thought content: denying SI, HI, not overtly expressing delusional content Thought Process: remains confused and concrete, childlike  Perception: denying AVH, not overtly RTIS Insight: poor  Judgement: poor   Orientation: to self  Attention/Concentration: limited due to psychosis  Memory/Cognition: poor - patient's most recent MOCA on 9/17 was an 8 with significant  impairments in attention and memory.  Fund of Knowledge: below average     Musculoskeletal: Strength & Muscle Tone: within normal limits Gait &  Station: normal Patient leans: N/A    Physical Exam Constitutional:      Appearance: the patient is not toxic-appearing.  Pulmonary:     Effort: Pulmonary effort is normal.  Neurological:     General: No focal deficit present.     Oriented to self and hospital only, does not verbalize date or situation HEENT:     Eyes EOMI   Review of Systems  Respiratory:  Negative for shortness of breath.   Cardiovascular:  Negative for chest pain.  Gastrointestinal:  Negative for abdominal pain, constipation, diarrhea, nausea and vomiting.  Neurological:  Negative for headaches.   Blood pressure 98/70, pulse 76, temperature (!) 97.4 F (36.3 C), temperature source Oral, resp. rate 16, height 5' 6 (1.676 m), weight 80.7 kg, SpO2 97%. Body mass index is 28.71 kg/m.   Treatment Plan Summary: Daily contact with patient to assess and evaluate symptoms and progress in treatment and Medication management  Assessment Allison Bolton is a 53 yr old female who presented on 02/17/24 to [facility] ED with a chief complaint of anxiety. In the ED, she was found to be acutely psychotic with tangential speech, agitation, and delusional thought content. Patient was admitted involuntarily to [facility] on 8/14.  Although substance-induced psychosis has previously been documented, she additional presents with prior reported manic episodes and a bipolar 1 diagnosis has been most reflective of this. On this admission, presented with agitation and increased rate of speech, initially had significantly poor sleep and psychotic symptoms, disorganization of thoughts, behavior, concern for AVH.  After approximately 1 week manic symptoms had largely resolved with antipsychotic medications and mood stabilizers however she remained with disorganization in thoughts and behavior and poor self care initially concerning for ongoing psychosis but after extended observation appears more in line with underlying major neurocognitive  disorder due to multiple etiologies. She has significant cognitive impairments with most recent MOCA scoring at 8 and recommendation for 24 hr supervision and help/prompting ADLs. She has been noted to be childlike in her interactions and requires direction to eat and shower but is otherwise pleasant and cooperative with care with no overt psychotic symptoms. Suspect multifactorial causes of cognitive impairment including longstanding substance use, recurrent manic/psychotic episodes, possible underlying low functioning (unknown).   Over the last 2 weeks the patient has been pleasant, denying AVH, somewhat more engaged in exam and with better eye contact. She has not been seen to be RTIS or overtly expressing delusional content. Presentation remains reflective of treated bipolar disorder and ongoing neurocognitive impairments. At this time we will pursue discharge and recommendation for SNF vs group home and level 2 has been filled out - virtual meeting today from prospective group home, appeared to go well. In-person meeting Monday. PPD ordered to be given today so that individual meeting her can check for TB.    DSM-5 diagnoses: Bipolar 1 Disorder, most recent episode manic, severe, with psychotic features    -currently resolved: no active symptoms of mania or psychosis, just cognitive deficits 2.  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 History of substance induced psychotic disorder   Plan: Legal status: IVC - renewed 9/30 for an additional 14 days  Psychiatric medications:  -Continue Haldol  10 mg q12 for psychosis and mood stability -Continue Geodon  60 mg bid -Continue Depakote  DR 750 mg q12 for mood  stability -Continue Cogentin  1 mg BID for Drug Induced EPS -Continue Agitation Protocol: Zyprexa    Nicotine  Dependence: -Continue Nicotine  Gum 2 mg PRN  Additional PRNs -Continue Zofran  4 mg q8 PRN nausea/vomiting -Continue PRN's: Tylenol , Maalox, Atarax , Milk of  Magnesia, Trazodone    --  The risks/benefits/side-effects/alternatives to medications were discussed in detail with the patient and time was given for questions. The patient consents to medication trials.                -- Metabolic profile and EKG monitoring obtained while on an atypical antipsychotic (BMI: 28.73 Lipid Panel: WNL except Trig: 180 HbgA1c: 5.1)              -- Encouraged patient to participate in unit milieu and in scheduled group therapies              -- Short Term Goals: Ability to identify changes in lifestyle to reduce recurrence of condition will improve, Ability to verbalize feelings will improve, Ability to disclose and discuss suicidal ideas, Ability to demonstrate self-control will improve, Ability to identify and develop effective coping behaviors will improve, Ability to maintain clinical measurements within normal limits will improve, Compliance with prescribed medications will improve, and Ability to identify triggers associated with substance abuse/mental health issues will improve             -- Long Term Goals: Improvement in symptoms so as ready for discharge   Safety and Monitoring:             -- Involuntary admission to inpatient psychiatric unit for safety, stabilization and treatment             -- Daily contact with patient to assess and evaluate symptoms and progress in treatment             -- Patient's case to be discussed in multi-disciplinary team meeting             -- Observation Level : q15 minute checks             -- Vital signs:  q12 hours             -- Precautions: suicide, elopement, and assault  Discharge Planning:             -appreciate SW assistance in discharging  [facility] SW now involved and will be getting the patient a guardian and assisting in finding placement. Level 2 filled out .    Leita LOISE Arts, MD 04/08/2024, 9:36 AM

## 2024-04-08 NOTE — [facility] Group Notes (Addendum)
 Spirituality Group   Group Goal: Support / Education around grief and loss   Group Description: Following introductions and group rules, group members engaged in facilitated group dialog and support around topic of loss, with particular support around experiences of loss in their lives. Group members identified types of loss (relationships / self / things) as well as patterns, circumstances, and changes that precipitate loss. Reflection invited on thoughts / feelings around loss, normalized grief responses, and recognized variety in grief experience. Group noted Worden's four tasks of grief in discussion. Group drew on Adlerian / Rogerian, narrative, MI, with Yalom's group therapy as a primary framework.   Observations: Allison Bolton moved in and out of group staying present for about 0.5 of the time. She made a good effort to engage peers as able.  Allison Bolton HERO.Div

## 2024-04-08 NOTE — Plan of Care (Signed)
  Problem: Education: Goal: Emotional status will improve Outcome: Progressing   Problem: Coping: Goal: Ability to demonstrate self-control will improve Outcome: Progressing   

## 2024-04-08 NOTE — Group Note (Signed)
 Recreation Therapy Group Note   Group Topic:Team Building  Group Date: 04/08/2024 Start Time: 1020 End Time: 1050 Facilitators: Aviyana Sonntag-McCall, LRT,CTRS Location: [address] Dayroom   Group Topic: Communication, Team Building, Problem Solving  Goal Area(s) Addresses:  Patient will effectively work with peer towards shared goal.  Patient will identify skills used to make activity successful.  Patient will identify how skills used during activity can be used to reach post d/c goals.   Behavioral Response: Minimal  Intervention: STEM Activity  Activity: Straw Bridge. In teams of 3-5, patients were given 15 plastic drinking straws and an equal length of masking tape. Using the materials provided, patients were instructed to build a free standing bridge-like structure to suspend an everyday item (ex: puzzle box) off of the floor or table surface. All materials were required to be used by the team in their design. LRT facilitated post-activity discussion reviewing team process. Patients were encouraged to reflect how the skills used in this activity can be generalized to daily life post discharge.   Education: Pharmacist, community, Scientist, physiological, Discharge Planning   Education Outcome: Acknowledges education/In group clarification offered/Needs additional education.    Affect/Mood: Happy   Participation Level: Minimal   Participation Quality: Independent   Behavior: Distracted   Speech/Thought Process: Disorganized   Insight: Lacking   Judgement: Lacking    Modes of Intervention: STEM Activity   Patient Response to Interventions:  Receptive   Education Outcome:  In group clarification offered    Clinical Observations/Individualized Feedback: Pt was bright and happy. Pt was unorganized in her thoughts. Pt kept asking about food and was focused on that.     Plan: Continue to engage patient in RT group sessions 2-3x/week.   Bluford Sedler-McCall, LRT,CTRS 04/08/2024  12:32 PM

## 2024-04-08 NOTE — Progress Notes (Signed)
(  Sleep Hours) -10.75 (Any PRNs that were needed, meds refused, or side effects to meds)- none (Any disturbances and when (visitation, over night)-none (Concerns raised by the patient)- none (SI/HI/AVH)- denied

## 2024-04-08 NOTE — Group Note (Signed)
 Date:  04/08/2024 Time:  8:22 PM  Group Topic/Focus:  Wrap-Up Group:   The focus of this group is to help patients review their daily goal of treatment and discuss progress on daily workbooks.    Participation Level:  Did Not Attend   Allison Bolton 04/08/2024, 8:22 PM

## 2024-04-09 DIAGNOSIS — F0280 Dementia in other diseases classified elsewhere without behavioral disturbance: Secondary | ICD-10-CM | POA: Diagnosis not present

## 2024-04-09 DIAGNOSIS — F312 Bipolar disorder, current episode manic severe with psychotic features: Secondary | ICD-10-CM | POA: Diagnosis not present

## 2024-04-09 NOTE — Group Note (Signed)
 Date:  04/09/2024 Time:  8:43 PM  Group Topic/Focus:  Wrap-Up Group:   The focus of this group is to help patients review their daily goal of treatment and discuss progress on daily workbooks.    Participation Level:  Did Not Attend   [name] 04/09/2024, 8:43 PM

## 2024-04-09 NOTE — Progress Notes (Signed)
 [facility] MD Progress Note  04/09/2024 7:52 AM Allison Bolton  MRN:  [id number]  Principal Problem: Bipolar affective disorder, most recent episode manic with psychotic symptoms (HCC)     Major Neurocognitive Disorder due to multiple etiologies  Diagnosis: Principal Problem:   Bipolar affective disorder, current episode manic with psychotic symptoms (HCC)  Major Neurocognitive Disorder due to multiple etiologies    Total Time spent with patient:  I personally spent 25 minutes on the unit in direct patient care. The direct patient care time included face-to-face time with the patient, reviewing the patient's chart, communicating with other professionals, and coordinating care.   Identifying Information and brief psychiatric history:  Allison Bolton is a 53 yr old female with working psychiatric diagnoses of bipolar 1 disorder and major neurocognitive disorder. 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 She does have a history of prior psychiatric hospitalizations with substance use driving psychotic symptoms, however on this admission has presented with clear manic and psychotic symptoms that cleared with time and medications, however underlying neurocognitive deficits have remained. She has one prior suicide Attempt (2007), and Prior Psychiatric Hospitalizations (last- [facility] 01/2024).  On this admission she presented to [facility] ED on 02/17/24  with a chief complaint of anxiety. In the ED, she was found to be acutely psychotic with tangential speech, agitation, and delusional thought content. Patient was admitted involuntarily to [facility] on 8/14. Since admission the patient has been notably unresponsive to antipsychotic medications, largely related to poor self-care appearing disheveled and requiring direction to do ADLs. Although initially this was concerning for lingering psychotic symptoms, several OT evaluations have noted significant cognitive deficits and  poor self care is now thought to be related to major neurocognitive disorder. Level 2 has been done with plan for transfer to SNF vs memory care center.    Interval events:  Patient remains childlike and requiring redirection, but pleasant and smiling, medication compliant, and interactive with staff. No SI, HI or AVH reported. No behavioral concerns. She has next in-person meeting on Monday and guardianship hearing on Tuesday, 10/7.  Interview today:  Today the patient reports she is good! She is wondering when she will be able to leave the hospital and was reminded about the meeting on Monday. This dino asked [name] about current date and she reported May or June. Advised her we are in October. She otherwise denies concerns. Reports good sleep and appetite, good energy, no SI, HI or AVH.   Past Medical History:  Past Medical History:  Diagnosis Date   Anxiety    Arthritis    Asthma    Bipolar 1 disorder (HCC)    Diverticulosis    Dysrhythmia    sts I have heart palpitations   Fibromyalgia    H/O hiatal hernia    4   Headache(784.0)    High blood pressure    Meniere's disease    S/P colonoscopy    Dr. Golda 2010: few small diverticula at sigmoid. otherwise normal.    Shortness of breath     Past Surgical History:  Procedure Laterality Date   ABDOMINAL HYSTERECTOMY     APPENDECTOMY     CESAREAN SECTION     CHOLECYSTECTOMY     COLONOSCOPY N/A 12/25/2023   Procedure: COLONOSCOPY;  Surgeon: Eartha Angelia Sieving, MD;  Location: [facility];  Service: Gastroenterology;  Laterality: N/A;  11:30am, asa 1   exploratory laparoscopy     FOOT SURGERY  HEMORRHOID SURGERY     LAPAROSCOPIC APPENDECTOMY  02/19/2011   Procedure: APPENDECTOMY LAPAROSCOPIC;  Surgeon: Oneil DELENA Budge;  Location: [facility] ORS;  Service: General;  Laterality: N/A;   LAPAROSCOPY  02/19/2011   Procedure: LAPAROSCOPY DIAGNOSTIC;  Surgeon: Oneil DELENA Budge;  Location: [facility] ORS;  Service: General;   Laterality: N/A;   left ovarian removal     multiple hernia repairs     Right ovarian removal     TONSILLECTOMY     TONSILLECTOMY AND ADENOIDECTOMY     tubes in ears     UMBILICAL HERNIA REPAIR  Dec 2011   Dr. Budge   wisdom teeth removal     Family History:  Family History  Problem Relation Age of Onset   Thyroid  disease Mother    Colon cancer Father    Breast cancer Maternal Aunt    Colon cancer Brother    Colon cancer Other        paternal and maternal grandfather   Meniere's disease Other    Anesthesia problems Neg Hx    Hypotension Neg Hx    Malignant hyperthermia Neg Hx    Pseudochol deficiency Neg Hx    Family Psychiatric  History:  None Reported   Social History:  Social History   Substance and Sexual Activity  Alcohol Use No   Comment: not since June 2012     Social History   Substance and Sexual Activity  Drug Use Yes   Types: Marijuana   Comment: patient denies any-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    Social History   Socioeconomic History   Marital status: Legally Separated    Spouse name: Not on file   Number of children: Not on file   Years of education: Not on file   Highest education level: Not on file  Occupational History   Not on file  Tobacco Use   Smoking status: Every Day    Current packs/day: 0.00    Average packs/day: 0.5 packs/day for 37.3 years (18.7 ttl pk-yrs)    Types: Cigarettes    Start date: 07/07/1981    Last attempt to quit: 11/05/2018    Years since quitting: 5.4   Smokeless tobacco: Never  Vaping Use   Vaping status: Never Used  Substance and Sexual Activity   Alcohol use: No    Comment: not since June 2012   Drug use: Yes    Types: Marijuana    Comment: patient denies any-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   Sexual activity: Never    Birth control/protection: Surgical  Other Topics Concern   Not on file  Social History Narrative   Not on file   Social Drivers of Health   Financial Resource  Strain: Not on file  Food Insecurity: Food Insecurity Present (02/18/2024)   Hunger Vital Sign    Worried About Running Out of Food in the Last Year: Sometimes true    Ran Out of Food in the Last Year: Sometimes true  Transportation Needs: Unmet Transportation Needs (02/18/2024)   PRAPARE - Administrator, Civil Service (Medical): Yes    Lack of Transportation (Non-Medical): Yes  Physical Activity: Not on file  Stress: Not on file  Social Connections: Unknown (10/14/2021)   Received from [facility]   Social Connections    Do your friends and family support you?: Not on file    What agencies support you?: Not on file  Current Medications: Current Facility-Administered Medications  Medication Dose Route Frequency Provider Last Rate Last Admin   acetaminophen  (TYLENOL ) tablet 650 mg  650 mg Oral Q6H PRN White, Patrice L, NP   650 mg at 03/31/24 1342   alum & mag hydroxide-simeth (MAALOX/MYLANTA) 200-200-20 MG/5ML suspension 30 mL  30 mL Oral Q4H PRN White, Patrice L, NP   30 mL at 03/24/24 0217   benztropine  (COGENTIN ) tablet 1 mg  1 mg Oral BID White, Patrice L, NP   1 mg at 04/08/24 1626   divalproex  (DEPAKOTE ) DR tablet 750 mg  750 mg Oral Q12H Ji, Andrew, MD   750 mg at 04/08/24 2044   haloperidol  (HALDOL ) tablet 10 mg  10 mg Oral Q12H Ji, Andrew, MD   10 mg at 04/08/24 2044   hydrOXYzine  (ATARAX ) tablet 25 mg  25 mg Oral TID PRN White, Patrice L, NP   25 mg at 04/08/24 2044   magnesium  hydroxide (MILK OF MAGNESIA) suspension 30 mL  30 mL Oral Daily PRN White, Patrice L, NP       nicotine  polacrilex (NICORETTE ) gum 2 mg  2 mg Oral PRN Trudy Carwin, NP   2 mg at 03/28/24 1716   OLANZapine  (ZYPREXA ) injection 10 mg  10 mg Intramuscular TID PRN White, Patrice L, NP       OLANZapine  (ZYPREXA ) injection 5 mg  5 mg Intramuscular TID PRN White, Patrice L, NP       OLANZapine  zydis (ZYPREXA ) disintegrating tablet 5 mg  5 mg Oral TID PRN White, Patrice L, NP   5 mg  at 04/06/24 2128   ondansetron  (ZOFRAN -ODT) disintegrating tablet 4 mg  4 mg Oral Q8H PRN Lynnette Barter, MD   4 mg at 04/02/24 1946   traZODone  (DESYREL ) tablet 50 mg  50 mg Oral QHS White, Patrice L, NP   50 mg at 04/08/24 2044   tuberculin injection 5 Units  5 Units Intradermal Once Towana Leita SAILOR, MD   5 Units at 04/07/24 1315   ziprasidone  (GEODON ) capsule 60 mg  60 mg Oral BID WC Marry Clamp, MD   60 mg at 04/08/24 1626    Lab Results:  No results found for this or any previous visit (from the past 48 hours).   Blood Alcohol level:  Lab Results  Component Value Date   [facility] <15 02/17/2024   ETH <15 01/14/2024    Metabolic Disorder Labs: Lab Results  Component Value Date   HGBA1C 5.1 01/18/2024   MPG 99.67 01/18/2024   MPG 105.41 05/11/2019   Lab Results  Component Value Date   PROLACTIN 24.1 (H) 05/11/2019   Lab Results  Component Value Date   CHOL 154 01/18/2024   TRIG 180 (H) 01/18/2024   HDL 49 01/18/2024   CHOLHDL 3.1 01/18/2024   VLDL 36 01/18/2024   LDLCALC 69 01/18/2024   LDLCALC 82 05/11/2019    Mental Status exam: Appearance: white female of slightly elevated BMI, fair hygiene, poor dentition seen sitting up in bed  Eye contact: good  Attitude towards examiner  cooperative, pleasant Psychomotor: no agitation or retardation Speech: reduced amount, infrequent one-word responses  Language: simplistic  Mood: good Affect: was euthymic, smiling  Thought content: denying SI, HI, not overtly expressing delusional content Thought Process: remains confused and concrete, childlike  Perception: denying AVH, not overtly RTIS Insight: poor  Judgement: poor   Orientation: to self - today reported month as May or June not able to give year. Not oriented to  situation Attention/Concentration: limited due to psychosis  Memory/Cognition: poor - patient's most recent MOCA on 9/17 was an 8 with significant impairments in attention and memory.  Fund of Knowledge:  below average     Musculoskeletal: Strength & Muscle Tone: within normal limits Gait & Station: normal Patient leans: N/A    Physical Exam Constitutional:      Appearance: the patient is not toxic-appearing.  Pulmonary:     Effort: Pulmonary effort is normal.  Neurological:     General: No focal deficit present.     Oriented to self and hospital only, does not verbalize date or situation HEENT:     Eyes EOMI   Review of Systems  Respiratory:  Negative for shortness of breath.   Cardiovascular:  Negative for chest pain.  Gastrointestinal:  Negative for abdominal pain, constipation, diarrhea, nausea and vomiting.  Neurological:  Negative for headaches.   Blood pressure (!) 105/94, pulse 65, temperature 97.6 F (36.4 C), temperature source Oral, resp. rate 16, height 5' 6 (1.676 m), weight 80.7 kg, SpO2 98%. Body mass index is 28.71 kg/m.   Treatment Plan Summary: Daily contact with patient to assess and evaluate symptoms and progress in treatment and Medication management  Assessment JALYSA SWOPES is a 53 yr old female who presented on 02/17/24 to [facility] ED with a chief complaint of anxiety. In the ED, she was found to be acutely psychotic with tangential speech, agitation, and delusional thought content. Patient was admitted involuntarily to [facility] on 8/14.  Although substance-induced psychosis has previously been documented, she additional presents with prior reported manic episodes and a bipolar 1 diagnosis has been most reflective of this. On this admission, presented with agitation and increased rate of speech, initially had significantly poor sleep and psychotic symptoms, disorganization of thoughts, behavior, concern for AVH.  After approximately 1 week manic symptoms had largely resolved with antipsychotic medications and mood stabilizers however she remained with disorganization in thoughts and behavior and poor self care initially concerning for ongoing  psychosis but after extended observation appears more in line with underlying major neurocognitive disorder due to multiple etiologies. She has significant cognitive impairments with most recent MOCA scoring at 8 and recommendation for 24 hr supervision and help/prompting ADLs. She has been noted to be childlike in her interactions and requires direction to eat and shower but is otherwise pleasant and cooperative with care with no overt psychotic symptoms. Suspect multifactorial causes of cognitive impairment including longstanding substance use, recurrent manic/psychotic episodes, possible underlying low functioning (unknown).   Over the last 2-3 weeks the patient has been pleasant, denying AVH, somewhat more engaged in exam and with better eye contact. She has not been seen to be RTIS or overtly expressing delusional content. Presentation remains reflective of treated bipolar disorder and ongoing neurocognitive impairments. At this time we will pursue discharge and recommendation for SNF vs group home and level 2 has been filled out - virtual meeting from prospective group home completed, appeared to go well. In-person meeting Monday and guardianship hearing on Tuesday. PPD ordered to be given 10/2.     DSM-5 diagnoses: Bipolar 1 Disorder, most recent episode manic, severe, with psychotic features    -currently resolved: no active symptoms of mania or psychosis, just cognitive deficits 2.  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 History of substance induced psychotic disorder   Plan: Legal status: IVC - renewed 9/30 for an additional 14 days  Psychiatric medications:  -Continue Haldol  10 mg q12  for psychosis and mood stability -Continue Geodon  60 mg bid -Continue Depakote  DR 750 mg q12 for mood stability -Continue Cogentin  1 mg BID for Drug Induced EPS -Continue Agitation Protocol: Zyprexa    Nicotine  Dependence: -Continue Nicotine  Gum 2 mg PRN  Additional  PRNs -Continue Zofran  4 mg q8 PRN nausea/vomiting -Continue PRN's: Tylenol , Maalox, Atarax , Milk of Magnesia, Trazodone    --  The risks/benefits/side-effects/alternatives to medications were discussed in detail with the patient and time was given for questions. The patient consents to medication trials.                -- Metabolic profile and EKG monitoring obtained while on an atypical antipsychotic (BMI: 28.73 Lipid Panel: WNL except Trig: 180 HbgA1c: 5.1)              -- Encouraged patient to participate in unit milieu and in scheduled group therapies              -- Short Term Goals: Ability to identify changes in lifestyle to reduce recurrence of condition will improve, Ability to verbalize feelings will improve, Ability to disclose and discuss suicidal ideas, Ability to demonstrate self-control will improve, Ability to identify and develop effective coping behaviors will improve, Ability to maintain clinical measurements within normal limits will improve, Compliance with prescribed medications will improve, and Ability to identify triggers associated with substance abuse/mental health issues will improve             -- Long Term Goals: Improvement in symptoms so as ready for discharge   Safety and Monitoring:             -- Involuntary admission to inpatient psychiatric unit for safety, stabilization and treatment             -- Daily contact with patient to assess and evaluate symptoms and progress in treatment             -- Patient's case to be discussed in multi-disciplinary team meeting             -- Observation Level : q15 minute checks             -- Vital signs:  q12 hours             -- Precautions: suicide, elopement, and assault  Discharge Planning:             -appreciate SW assistance in discharging  [facility] SW now involved and will be getting the patient a guardian and assisting in finding placement. Level 2 filled out . Next meeting with group home in-person Monday and  guardianship hearing on tuesday   Leita LOISE Arts, MD 04/09/2024, 7:52 AM

## 2024-04-09 NOTE — Plan of Care (Signed)
   Problem: Education: Goal: Emotional status will improve Outcome: Progressing Goal: Mental status will improve Outcome: Progressing

## 2024-04-09 NOTE — Progress Notes (Signed)
   04/09/24 1000  Psych Admission Type (Psych Patients Only)  Admission Status Involuntary  Psychosocial Assessment  Patient Complaints None  Eye Contact Fair  Facial Expression Animated  Affect Preoccupied  Speech Soft  Interaction Childlike  Motor Activity Slow  Appearance/Hygiene Improved  Behavior Characteristics Cooperative;Appropriate to situation  Mood Pleasant  Thought Process  Coherency Disorganized  Content Preoccupation  Delusions None reported or observed  Perception Depersonalization  Hallucination None reported or observed  Judgment Poor  Confusion Mild  Danger to Self  Current suicidal ideation? Denies  Danger to Others  Danger to Others None reported or observed

## 2024-04-09 NOTE — Progress Notes (Signed)
  Per lab, they are unable to perform the following lab:  QuantiFERON TB Gold Plus  Reason: Per lab, she did not have the tubes needed.  Lab rescheduled lab for this afternoon.

## 2024-04-10 DIAGNOSIS — F0280 Dementia in other diseases classified elsewhere without behavioral disturbance: Secondary | ICD-10-CM | POA: Diagnosis not present

## 2024-04-10 DIAGNOSIS — F1990 Other psychoactive substance use, unspecified, uncomplicated: Secondary | ICD-10-CM | POA: Diagnosis not present

## 2024-04-10 DIAGNOSIS — F312 Bipolar disorder, current episode manic severe with psychotic features: Secondary | ICD-10-CM | POA: Diagnosis not present

## 2024-04-10 NOTE — Progress Notes (Signed)
(  Sleep Hours) - 6.25 (Any PRNs that were needed, meds refused, or side effects to meds)- PRN vistaril  25 mg given at pt request, no meds refused.  (Any disturbances and when (visitation, over night)- None  (Concerns raised by the patient)- None  (SI/HI/AVH)- Denies SI/HI/AVH

## 2024-04-10 NOTE — Progress Notes (Signed)
 PPD follow up: no bump at injection site noted.

## 2024-04-10 NOTE — Group Note (Signed)
 Date:  04/10/2024 Time:  8:39 PM  Group Topic/Focus:  Wrap-Up Group:   The focus of this group is to help patients review their daily goal of treatment and discuss progress on daily workbooks.    Additional Comments:  Pt was encouraged, but opted out of attending wrap up group this evening.   Allison Bolton 04/10/2024, 8:39 PM

## 2024-04-10 NOTE — Progress Notes (Signed)
 [facility] MD Progress Note  04/10/2024 9:17 AM Allison Bolton  MRN:  [id number]  Principal Problem: Bipolar affective disorder, most recent episode manic with psychotic symptoms (HCC)     Major Neurocognitive Disorder due to multiple etiologies  Diagnosis: Principal Problem:   Bipolar affective disorder, current episode manic with psychotic symptoms (HCC)  Major Neurocognitive Disorder due to multiple etiologies    Total Time spent with patient:  I personally spent 25 minutes on the unit in direct patient care. The direct patient care time included face-to-face time with the patient, reviewing the patient's chart, communicating with other professionals, and coordinating care.   Identifying Information and brief psychiatric history:  Allison Bolton is a 53 yr old female with working psychiatric diagnoses of bipolar 1 disorder and major neurocognitive disorder. 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 She does have a history of prior psychiatric hospitalizations with substance use driving psychotic symptoms, however on this admission has presented with clear manic and psychotic symptoms that cleared with time and medications, however underlying neurocognitive deficits have remained. She has one prior suicide Attempt (2007), and Prior Psychiatric Hospitalizations (last- [facility] 01/2024).  On this admission she presented to [facility] ED on 02/17/24  with a chief complaint of anxiety. In the ED, she was found to be acutely psychotic with tangential speech, agitation, and delusional thought content. Patient was admitted involuntarily to [facility] on 8/14. Since admission the patient has been notably unresponsive to antipsychotic medications, largely related to poor self-care appearing disheveled and requiring direction to do ADLs. Although initially this was concerning for lingering psychotic symptoms, several OT evaluations have noted significant cognitive deficits and  poor self care is now thought to be related to major neurocognitive disorder. Level 2 has been done with plan for transfer to SNF vs memory care center.    Interval events:  Patient had her PDD (given afternoon 10/2) read this morning within the 48-72 hr window, documented via nursing plan of car no visible induration. Patient remains pleasant, medication compliant and with no behavioral concerns. Childlike in interactions and requiring direction. No SI, HI or AVH reported.    Interview today:  Today the patient reports she is good. Endorsed good sleep. Denying SI, HI or AVH. No concerns voiced. No additional information offered.   Past Medical History:  Past Medical History:  Diagnosis Date   Anxiety    Arthritis    Asthma    Bipolar 1 disorder (HCC)    Diverticulosis    Dysrhythmia    sts I have heart palpitations   Fibromyalgia    H/O hiatal hernia    4   Headache(784.0)    High blood pressure    Meniere's disease    S/P colonoscopy    Dr. Golda 2010: few small diverticula at sigmoid. otherwise normal.    Shortness of breath     Past Surgical History:  Procedure Laterality Date   ABDOMINAL HYSTERECTOMY     APPENDECTOMY     CESAREAN SECTION     CHOLECYSTECTOMY     COLONOSCOPY N/A 12/25/2023   Procedure: COLONOSCOPY;  Surgeon: Eartha Angelia Sieving, MD;  Location: [facility] ENDO [facility];  Service: Gastroenterology;  Laterality: N/A;  11:30am, asa 1   exploratory laparoscopy     FOOT SURGERY     HEMORRHOID SURGERY     LAPAROSCOPIC APPENDECTOMY  02/19/2011   Procedure: APPENDECTOMY LAPAROSCOPIC;  Surgeon: Oneil DELENA Budge;  Location: [facility] ORS;  Service: General;  Laterality:  N/A;   LAPAROSCOPY  02/19/2011   Procedure: LAPAROSCOPY DIAGNOSTIC;  Surgeon: Oneil DELENA Budge;  Location: [facility] ORS;  Service: General;  Laterality: N/A;   left ovarian removal     multiple hernia repairs     Right ovarian removal     TONSILLECTOMY     TONSILLECTOMY AND ADENOIDECTOMY     tubes in ears      UMBILICAL HERNIA REPAIR  Dec 2011   Dr. Budge   wisdom teeth removal     Family History:  Family History  Problem Relation Age of Onset   Thyroid  disease Mother    Colon cancer Father    Breast cancer Maternal Aunt    Colon cancer Brother    Colon cancer Other        paternal and maternal grandfather   Meniere's disease Other    Anesthesia problems Neg Hx    Hypotension Neg Hx    Malignant hyperthermia Neg Hx    Pseudochol deficiency Neg Hx    Family Psychiatric  History:  None Reported   Social History:  Social History   Substance and Sexual Activity  Alcohol Use No   Comment: not since June 2012     Social History   Substance and Sexual Activity  Drug Use Yes   Types: Marijuana   Comment: patient denies any-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    Social History   Socioeconomic History   Marital status: Legally Separated    Spouse name: Not on file   Number of children: Not on file   Years of education: Not on file   Highest education level: Not on file  Occupational History   Not on file  Tobacco Use   Smoking status: Every Day    Current packs/day: 0.00    Average packs/day: 0.5 packs/day for 37.3 years (18.7 ttl pk-yrs)    Types: Cigarettes    Start date: 07/07/1981    Last attempt to quit: 11/05/2018    Years since quitting: 5.4   Smokeless tobacco: Never  Vaping Use   Vaping status: Never Used  Substance and Sexual Activity   Alcohol use: No    Comment: not since June 2012   Drug use: Yes    Types: Marijuana    Comment: patient denies any-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   Sexual activity: Never    Birth control/protection: Surgical  Other Topics Concern   Not on file  Social History Narrative   Not on file   Social Drivers of Health   Financial Resource Strain: Not on file  Food Insecurity: Food Insecurity Present (02/18/2024)   Hunger Vital Sign    Worried About Running Out of Food in the Last Year: Sometimes true    Ran  Out of Food in the Last Year: Sometimes true  Transportation Needs: Unmet Transportation Needs (02/18/2024)   PRAPARE - Administrator, Civil Service (Medical): Yes    Lack of Transportation (Non-Medical): Yes  Physical Activity: Not on file  Stress: Not on file  Social Connections: Unknown (10/14/2021)   Received from [facility]   Social Connections    Do your friends and family support you?: Not on file    What agencies support you?: Not on file    Current Medications: Current Facility-Administered Medications  Medication Dose Route Frequency Provider Last Rate Last Admin   acetaminophen  (TYLENOL ) tablet 650 mg  650 mg Oral Q6H PRN  White, Patrice L, NP   650 mg at 03/31/24 1342   alum & mag hydroxide-simeth (MAALOX/MYLANTA) 200-200-20 MG/5ML suspension 30 mL  30 mL Oral Q4H PRN White, Patrice L, NP   30 mL at 03/24/24 0217   benztropine  (COGENTIN ) tablet 1 mg  1 mg Oral BID White, Patrice L, NP   1 mg at 04/10/24 0837   divalproex  (DEPAKOTE ) DR tablet 750 mg  750 mg Oral Q12H Ji, Andrew, MD   750 mg at 04/10/24 9163   haloperidol  (HALDOL ) tablet 10 mg  10 mg Oral Q12H Ji, Andrew, MD   10 mg at 04/10/24 9163   hydrOXYzine  (ATARAX ) tablet 25 mg  25 mg Oral TID PRN White, Patrice L, NP   25 mg at 04/09/24 2047   magnesium  hydroxide (MILK OF MAGNESIA) suspension 30 mL  30 mL Oral Daily PRN White, Patrice L, NP       nicotine  polacrilex (NICORETTE ) gum 2 mg  2 mg Oral PRN Trudy Carwin, NP   2 mg at 03/28/24 1716   OLANZapine  (ZYPREXA ) injection 10 mg  10 mg Intramuscular TID PRN White, Patrice L, NP       OLANZapine  (ZYPREXA ) injection 5 mg  5 mg Intramuscular TID PRN White, Patrice L, NP       OLANZapine  zydis (ZYPREXA ) disintegrating tablet 5 mg  5 mg Oral TID PRN White, Patrice L, NP   5 mg at 04/06/24 2128   ondansetron  (ZOFRAN -ODT) disintegrating tablet 4 mg  4 mg Oral Q8H PRN Lynnette Barter, MD   4 mg at 04/02/24 1946   traZODone  (DESYREL ) tablet 50 mg  50 mg  Oral QHS White, Patrice L, NP   50 mg at 04/09/24 2100   ziprasidone  (GEODON ) capsule 60 mg  60 mg Oral BID WC Marry Clamp, MD   60 mg at 04/10/24 9162    Lab Results:  No results found for this or any previous visit (from the past 48 hours).   Blood Alcohol level:  Lab Results  Component Value Date   [facility] <15 02/17/2024   ETH <15 01/14/2024    Metabolic Disorder Labs: Lab Results  Component Value Date   HGBA1C 5.1 01/18/2024   MPG 99.67 01/18/2024   MPG 105.41 05/11/2019   Lab Results  Component Value Date   PROLACTIN 24.1 (H) 05/11/2019   Lab Results  Component Value Date   CHOL 154 01/18/2024   TRIG 180 (H) 01/18/2024   HDL 49 01/18/2024   CHOLHDL 3.1 01/18/2024   VLDL 36 01/18/2024   LDLCALC 69 01/18/2024   LDLCALC 82 05/11/2019    Mental Status exam: Appearance: white female of slightly elevated BMI, fair hygiene, poor dentition seen waiting in line for medications Eye contact: good  Attitude towards examiner  cooperative, pleasant Psychomotor: no agitation or retardation Speech: reduced amount, infrequent one-word responses  Language: simplistic  Mood: good Affect: was euthymic, smiling  Thought content: denying SI, HI, not overtly expressing delusional content Thought Process: remains confused and concrete, childlike  Perception: denying AVH, not overtly RTIS Insight: poor  Judgement: poor   Orientation: to self and hospital only - not able to accurately report date, situation Attention/Concentration: limited due to cognitive deficits   Memory/Cognition: poor - patient's most recent MOCA on 9/17 was an 8 with significant impairments in attention and memory.  Fund of Knowledge: below average     Musculoskeletal: Strength & Muscle Tone: within normal limits Gait & Station: normal Patient leans: N/A  Physical Exam Constitutional:      Appearance: the patient is not toxic-appearing.  Pulmonary:     Effort: Pulmonary effort is normal.   Neurological:     General: No focal deficit present.     Oriented to self and hospital only, does not verbalize date or situation HEENT:     Eyes EOMI   Review of Systems  Respiratory:  Negative for shortness of breath.   Cardiovascular:  Negative for chest pain.  Gastrointestinal:  Negative for abdominal pain, constipation, diarrhea, nausea and vomiting.  Neurological:  Negative for headaches.   Blood pressure (!) 130/92, pulse 74, temperature (!) 97.3 F (36.3 C), temperature source Oral, resp. rate 16, height 5' 6 (1.676 m), weight 80.7 kg, SpO2 97%. Body mass index is 28.71 kg/m.   Treatment Plan Summary: Daily contact with patient to assess and evaluate symptoms and progress in treatment and Medication management  Assessment Allison Bolton is a 53 yr old female who presented on 02/17/24 to [facility] ED with a chief complaint of anxiety. In the ED, she was found to be acutely psychotic with tangential speech, agitation, and delusional thought content. Patient was admitted involuntarily to [facility] on 8/14.  Although substance-induced psychosis has previously been documented, she additional presents with prior reported manic episodes and a bipolar 1 diagnosis has been most reflective of this. On this admission, presented with agitation and increased rate of speech, initially had significantly poor sleep and psychotic symptoms, disorganization of thoughts, behavior, concern for AVH.  After approximately 1 week manic symptoms had largely resolved with antipsychotic medications and mood stabilizers however she remained with disorganization in thoughts and behavior and poor self care initially concerning for ongoing psychosis but after extended observation appears more in line with underlying major neurocognitive disorder due to multiple etiologies. She has significant cognitive impairments with most recent MOCA scoring at 8 and recommendation for 24 hr supervision and help/prompting ADLs.  She has been noted to be childlike in her interactions and requires direction to eat and shower but is otherwise pleasant and cooperative with care with no overt psychotic symptoms. Suspect multifactorial causes of cognitive impairment including longstanding substance use, recurrent manic/psychotic episodes, possible underlying low functioning (unknown).   Over the last 2-3 weeks the patient has been pleasant, denying AVH, somewhat more engaged in exam and with better eye contact. She has not been seen to be RTIS or overtly expressing delusional content. Presentation remains reflective of treated bipolar disorder and ongoing neurocognitive impairments. At this time we will pursue discharge and recommendation for SNF vs group home and level 2 has been filled out - virtual meeting from prospective group home completed, appeared to go well. In-person meeting Monday and guardianship hearing on Tuesday. PPD ordered and given 10/2, read this morning within 72 hrs (plan of care nursing note) with no redness or induration noted.    DSM-5 diagnoses: Bipolar 1 Disorder, most recent episode manic, severe, with psychotic features    -currently resolved: no active symptoms of mania or psychosis, just cognitive deficits 2.  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 History of substance induced psychotic disorder   Plan: Legal status: IVC - renewed 9/30 for an additional 14 days  Psychiatric medications:  -Continue Haldol  10 mg q12 for psychosis and mood stability -Continue Geodon  60 mg bid -Continue Depakote  DR 750 mg q12 for mood stability -Continue Cogentin  1 mg BID for Drug Induced EPS -Continue Agitation Protocol: Zyprexa    Nicotine  Dependence: -Continue Nicotine   Gum 2 mg PRN  Additional PRNs -Continue Zofran  4 mg q8 PRN nausea/vomiting -Continue PRN's: Tylenol , Maalox, Atarax , Milk of Magnesia, Trazodone    --  The risks/benefits/side-effects/alternatives to medications were  discussed in detail with the patient and time was given for questions. The patient consents to medication trials.                -- Metabolic profile and EKG monitoring obtained while on an atypical antipsychotic (BMI: 28.73 Lipid Panel: WNL except Trig: 180 HbgA1c: 5.1)              -- Encouraged patient to participate in unit milieu and in scheduled group therapies              -- Short Term Goals: Ability to identify changes in lifestyle to reduce recurrence of condition will improve, Ability to verbalize feelings will improve, Ability to disclose and discuss suicidal ideas, Ability to demonstrate self-control will improve, Ability to identify and develop effective coping behaviors will improve, Ability to maintain clinical measurements within normal limits will improve, Compliance with prescribed medications will improve, and Ability to identify triggers associated with substance abuse/mental health issues will improve             -- Long Term Goals: Improvement in symptoms so as ready for discharge   Safety and Monitoring:             -- Involuntary admission to inpatient psychiatric unit for safety, stabilization and treatment             -- Daily contact with patient to assess and evaluate symptoms and progress in treatment             -- Patient's case to be discussed in multi-disciplinary team meeting             -- Observation Level : q15 minute checks             -- Vital signs:  q12 hours             -- Precautions: suicide, elopement, and assault  Discharge Planning:             -appreciate SW assistance in discharging  [facility] SW now involved and will be getting the patient a guardian and assisting in finding placement. Level 2 filled out . Next meeting with group home in-person Monday and guardianship hearing on tuesday   Leita LOISE Arts, MD 04/10/2024, 9:17 AM

## 2024-04-10 NOTE — Progress Notes (Addendum)
 Pt reports eating breakfast, lunch and dinner today. Janette, MHT confirms.

## 2024-04-10 NOTE — Progress Notes (Signed)
   04/10/24 0838  Psych Admission Type (Psych Patients Only)  Admission Status Involuntary  Psychosocial Assessment  Patient Complaints None  Eye Contact Fair  Facial Expression Animated  Affect Preoccupied  Speech Soft  Interaction Childlike  Motor Activity Slow  Appearance/Hygiene Disheveled  Behavior Characteristics Cooperative  Mood Preoccupied  Thought Process  Coherency Disorganized  Content Preoccupation  Delusions None reported or observed  Perception Depersonalization  Hallucination None reported or observed  Judgment Poor  Confusion Mild  Danger to Self  Current suicidal ideation? Denies  Agreement Not to Harm Self Yes  Description of Agreement verbal  Danger to Others  Danger to Others None reported or observed

## 2024-04-10 NOTE — Plan of Care (Signed)
   Problem: Education: Goal: Emotional status will improve Outcome: Progressing Goal: Mental status will improve Outcome: Progressing Goal: Verbalization of understanding the information provided will improve Outcome: Progressing   Problem: Activity: Goal: Interest or engagement in activities will improve Outcome: Progressing

## 2024-04-10 NOTE — Progress Notes (Signed)
   04/09/24 2047  Psych Admission Type (Psych Patients Only)  Admission Status Involuntary  Psychosocial Assessment  Patient Complaints None  Eye Contact Fair  Facial Expression Animated;Fixed smile  Affect Preoccupied  Speech Soft  Interaction Childlike  Motor Activity Slow  Appearance/Hygiene In scrubs  Behavior Characteristics Cooperative  Mood Pleasant;Preoccupied  Thought Process  Coherency Disorganized  Content Preoccupation  Delusions None reported or observed  Perception Depersonalization  Hallucination None reported or observed  Judgment Poor  Confusion Mild  Danger to Self  Current suicidal ideation? Denies  Agreement Not to Harm Self Yes  Description of Agreement Verbal  Danger to Others  Danger to Others None reported or observed

## 2024-04-11 ENCOUNTER — Encounter (HOSPITAL_COMMUNITY): Payer: Self-pay

## 2024-04-11 DIAGNOSIS — F1990 Other psychoactive substance use, unspecified, uncomplicated: Secondary | ICD-10-CM | POA: Diagnosis not present

## 2024-04-11 DIAGNOSIS — F312 Bipolar disorder, current episode manic severe with psychotic features: Secondary | ICD-10-CM | POA: Diagnosis not present

## 2024-04-11 DIAGNOSIS — F0280 Dementia in other diseases classified elsewhere without behavioral disturbance: Secondary | ICD-10-CM | POA: Diagnosis not present

## 2024-04-11 NOTE — Progress Notes (Signed)
   04/10/24 2030  Psych Admission Type (Psych Patients Only)  Admission Status Involuntary  Psychosocial Assessment  Patient Complaints None  Eye Contact Fair  Facial Expression Animated;Fixed smile  Affect Preoccupied  Speech Soft  Interaction Childlike  Motor Activity Slow  Appearance/Hygiene Disheveled;In scrubs  Behavior Characteristics Cooperative  Mood Preoccupied;Pleasant  Thought Process  Coherency Disorganized  Content Preoccupation  Delusions None reported or observed  Perception Depersonalization  Hallucination None reported or observed  Judgment Poor  Confusion Mild  Danger to Self  Current suicidal ideation? Denies  Agreement Not to Harm Self Yes  Description of Agreement Verbal  Danger to Others  Danger to Others None reported or observed

## 2024-04-11 NOTE — Progress Notes (Signed)
   04/11/24 0800  Psych Admission Type (Psych Patients Only)  Admission Status Involuntary  Psychosocial Assessment  Patient Complaints None  Eye Contact Fair  Facial Expression Fixed smile  Affect Preoccupied  Speech Soft  Interaction Childlike  Motor Activity Slow  Appearance/Hygiene Improved  Behavior Characteristics Cooperative  Mood Pleasant  Thought Process  Coherency Disorganized  Content Preoccupation  Delusions None reported or observed  Perception Depersonalization  Hallucination None reported or observed  Judgment Impaired  Confusion Mild  Danger to Self  Current suicidal ideation? Denies  Agreement Not to Harm Self Yes  Description of Agreement verbal  Danger to Others  Danger to Others None reported or observed

## 2024-04-11 NOTE — Plan of Care (Signed)
?  Problem: Education: ?Goal: Knowledge of [facility] Education information/materials will improve ?Outcome: Progressing ?Goal: Emotional status will improve ?Outcome: Progressing ?Goal: Mental status will improve ?Outcome: Progressing ?Goal: Verbalization of understanding the information provided will improve ?Outcome: Progressing ?  ?Problem: Activity: ?Goal: Interest or engagement in activities will improve ?Outcome: Progressing ?  ?Problem: Safety: ?Goal: Periods of time without injury will increase ?Outcome: Progressing ?  ?

## 2024-04-11 NOTE — Progress Notes (Signed)
(  Sleep Hours) - 13.5 (Any PRNs that were needed, meds refused, or side effects to meds)- PRN vistaril  25 mg given, no meds refused.  (Any disturbances and when (visitation, over night)- None  (Concerns raised by the patient)- None  (SI/HI/AVH)- Denies SI/HI/AVH
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
## 2024-04-11 NOTE — Group Note (Signed)
 Recreation Therapy Group Note   Group Topic:Coping Skills  Group Date: 04/11/2024 Start Time: 1010 End Time: 1040 Facilitators: Giovoni Bunch-McCall, LRT,CTRS Location: [address] Dayroom   Group Topic: Coping Skills   Goal Area(s) Addresses: Patient will define what a coping skill is. Patient will work with peer to create a list of healthy coping skills beginning with each letter of the alphabet. Patient will successfully identify positive coping skills they can use post d/c.  Patient will acknowledge benefit(s) of using learned coping skills post d/c.  Behavioral Response:    Intervention: Worksheet   Activity: Coping A to Z. Patient asked to identify what a coping skill is and when they use them. Patients with Clinical research associate discussed healthy versus unhealthy coping skills. Next patients were given a blank worksheet titled Coping Skills A-Z.  Partners were instructed to come up with at least one positive coping skill per letter of the alphabet. Patients were given 15 minutes to brainstorm before ideas were presented to the large group. Patients and LRT debriefed on the importance of coping skill selection based on situation and back-up plans when a skill tried is not effective. At the end of group, patients were given an handout of alphabetized strategies to keep for future reference.   Education: Pharmacologist, Scientist, physiological, Discharge Planning.    Education Outcome: Acknowledges education/Verbalizes understanding/In group clarification offered/Additional education needed   Affect/Mood: Happy   Participation Level: None   Participation Quality: None   Behavior: Disinterested   Speech/Thought Process: None   Insight: None   Judgement: None   Modes of Intervention: Worksheet   Patient Response to Interventions:  Disengaged   Education Outcome:  In group clarification offered    Clinical Observations/Individualized Feedback: Pt came into group as peers were sharing the  coping skills they were able to come up with.     Plan: Continue to engage patient in RT group sessions 2-3x/week.   Lonita Debes-McCall, LRT,CTRS 04/11/2024 12:42 PM

## 2024-04-11 NOTE — Progress Notes (Signed)
 Collateral contact - Donzell from [facility] visited patient.   Patient was accepted into the group home, and can go there around 04/25/2024, in 2 weeks.  Donzell requested a blood TB test (today she was given a copy of a skin TB test).   Tanishia Lemaster, LCSWA 04/11/2024

## 2024-04-11 NOTE — Progress Notes (Signed)
   04/11/24 2047  Psych Admission Type (Psych Patients Only)  Admission Status Involuntary  Psychosocial Assessment  Patient Complaints None  Eye Contact Fair  Facial Expression Flat  Affect Preoccupied  Speech Soft  Interaction Childlike  Motor Activity Slow  Appearance/Hygiene Unremarkable  Behavior Characteristics Cooperative  Mood Preoccupied  Thought Process  Coherency Disorganized  Content Preoccupation  Delusions None reported or observed  Perception Depersonalization  Hallucination None reported or observed  Judgment Impaired  Confusion Mild  Danger to Self  Current suicidal ideation? Denies  Agreement Not to Harm Self Yes  Description of Agreement Verbal  Danger to Others  Danger to Others None reported or observed

## 2024-04-11 NOTE — Group Note (Signed)
 Date:  04/11/2024 Time:  8:35 PM  Group Topic/Focus:  Wrap-Up Group:   The focus of this group is to help patients review their daily goal of treatment and discuss progress on daily workbooks.    Participation Level:  Did Not Attend  PFrancis, Katsumi Wisler Dacosta 04/11/2024, 8:35 PM

## 2024-04-11 NOTE — Plan of Care (Signed)
   Problem: Education: Goal: Emotional status will improve Outcome: Progressing Goal: Mental status will improve Outcome: Progressing Goal: Verbalization of understanding the information provided will improve Outcome: Progressing   Problem: Activity: Goal: Interest or engagement in activities will improve Outcome: Progressing

## 2024-04-11 NOTE — Group Note (Signed)
 LCSW Group Therapy Note   Group Date: 04/11/2024 Start Time: 1300 End Time: 1400   Participation:  patient was present for half of the group session.  She actively participated in the discussion.  Type of Therapy:  Group Therapy  Objective:  Learn techniques for managing stress through body relaxation, mindfulness, and self-compassion.  Goals: Use body relaxation techniques, such as Box Breathing and Progressive Muscle Relaxation, to reduce physical tension. Practice mindfulness to break the cycle of overthinking and mental chatter. Embrace self-compassion to handle stress with kindness and resilience.  Summary:  Today's session focused on calming the body with relaxation techniques, breaking the cycle of stress with mindfulness, and using self-compassion to manage challenges more gracefully. These tools help reduce stress and foster a balanced, peaceful mindset.  Therapeutic Modalities used:  Elements of CBT ( cognitive restructuring)  Elements of DBT (box breathing, progressive body relaxation, mindfulness, acceptance)    Mikiala Fugett O Spero Gunnels, LCSWA 04/11/2024  6:35 PM

## 2024-04-11 NOTE — Progress Notes (Signed)
 [facility] MD Progress Note  04/11/2024 8:18 AM Allison Bolton  MRN:  [id number]  Principal Problem: Bipolar affective disorder, most recent episode manic with psychotic symptoms (HCC)     Major Neurocognitive Disorder due to multiple etiologies  Diagnosis: Principal Problem:   Bipolar affective disorder, current episode manic with psychotic symptoms (HCC)  Major Neurocognitive Disorder due to multiple etiologies    Total Time spent with patient:  I personally spent 25 minutes on the unit in direct patient care. The direct patient care time included face-to-face time with the patient, reviewing the patient's chart, communicating with other professionals, and coordinating care.   Identifying Information and brief psychiatric history:  Allison Bolton is a 53 yr old female with working psychiatric diagnoses of bipolar 1 disorder and major neurocognitive disorder. 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 She does have a history of prior psychiatric hospitalizations with substance use driving psychotic symptoms, however on this admission has presented with clear manic and psychotic symptoms that cleared with time and medications, however underlying neurocognitive deficits have remained. She has one prior suicide Attempt (2007), and Prior Psychiatric Hospitalizations (last- [facility] 01/2024).  On this admission she presented to [facility] ED on 02/17/24  with a chief complaint of anxiety. In the ED, she was found to be acutely psychotic with tangential speech, agitation, and delusional thought content. Patient was admitted involuntarily to [facility] on 8/14. Since admission the patient has been notably unresponsive to antipsychotic medications, largely related to poor self-care appearing disheveled and requiring direction to do ADLs. Although initially this was concerning for lingering psychotic symptoms, several OT evaluations have noted significant cognitive deficits and  poor self care is now thought to be related to major neurocognitive disorder. Level 2 has been done with plan for transfer to SNF vs memory care center.    Interval events:  Patient had her PDD read yesterday morning, documented via nursing plan of car no visible induration. Patient has remained cooperative with care, pleasant, medication compliant and with no behavioral concerns. Childlike in interactions and requiring direction. No SI, HI or AVH reported.  She has in-person interview today @ 4pm.  Interview today:  Today the patient reports she is good. Reports good sleep and good appetite. States she is enjoying the groups. No concerns voiced today. Denies SI, HI and AVH.    Past Medical History:  Past Medical History:  Diagnosis Date   Anxiety    Arthritis    Asthma    Bipolar 1 disorder (HCC)    Diverticulosis    Dysrhythmia    sts I have heart palpitations   Fibromyalgia    H/O hiatal hernia    4   Headache(784.0)    High blood pressure    Meniere's disease    S/P colonoscopy    Dr. Golda 2010: few small diverticula at sigmoid. otherwise normal.    Shortness of breath     Past Surgical History:  Procedure Laterality Date   ABDOMINAL HYSTERECTOMY     APPENDECTOMY     CESAREAN SECTION     CHOLECYSTECTOMY     COLONOSCOPY N/A 12/25/2023   Procedure: COLONOSCOPY;  Surgeon: Eartha Angelia Sieving, MD;  Location: [facility];  Service: Gastroenterology;  Laterality: N/A;  11:30am, asa 1   exploratory laparoscopy     FOOT SURGERY     HEMORRHOID SURGERY     LAPAROSCOPIC APPENDECTOMY  02/19/2011   Procedure: APPENDECTOMY LAPAROSCOPIC;  Surgeon: Oneil DELENA Budge;  Location: [facility] OR[facility];  Service: General;  Laterality: N/A;   LAPAROSCOPY  02/19/2011   Procedure: LAPAROSCOPY DIAGNOSTIC;  Surgeon: Oneil DELENA Budge;  Location: [facility];  Service: General;  Laterality: N/A;   left ovarian removal     multiple hernia repairs     Right ovarian removal     TONSILLECTOMY      TONSILLECTOMY AND ADENOIDECTOMY     tubes in ears     UMBILICAL HERNIA REPAIR  Dec 2011   Dr. Budge   wisdom teeth removal     Family History:  Family History  Problem Relation Age of Onset   Thyroid  disease Mother    Colon cancer Father    Breast cancer Maternal Aunt    Colon cancer Brother    Colon cancer Other        paternal and maternal grandfather   Meniere's disease Other    Anesthesia problems Neg Hx    Hypotension Neg Hx    Malignant hyperthermia Neg Hx    Pseudochol deficiency Neg Hx    Family Psychiatric  History:  None Reported   Social History:  Social History   Substance and Sexual Activity  Alcohol Use No   Comment: not since June 2012     Social History   Substance and Sexual Activity  Drug Use Yes   Types: Marijuana   Comment: patient denies any-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    Social History   Socioeconomic History   Marital status: Legally Separated    Spouse name: Not on file   Number of children: Not on file   Years of education: Not on file   Highest education level: Not on file  Occupational History   Not on file  Tobacco Use   Smoking status: Every Day    Current packs/day: 0.00    Average packs/day: 0.5 packs/day for 37.3 years (18.7 ttl pk-yrs)    Types: Cigarettes    Start date: 07/07/1981    Last attempt to quit: 11/05/2018    Years since quitting: 5.4   Smokeless tobacco: Never  Vaping Use   Vaping status: Never Used  Substance and Sexual Activity   Alcohol use: No    Comment: not since June 2012   Drug use: Yes    Types: Marijuana    Comment: patient denies any-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   Sexual activity: Never    Birth control/protection: Surgical  Other Topics Concern   Not on file  Social History Narrative   Not on file   Social Drivers of Health   Financial Resource Strain: Not on file  Food Insecurity: Food Insecurity Present (02/18/2024)   Hunger Vital Sign    Worried About  Running Out of Food in the Last Year: Sometimes true    Ran Out of Food in the Last Year: Sometimes true  Transportation Needs: Unmet Transportation Needs (02/18/2024)   PRAPARE - Administrator, Civil Service (Medical): Yes    Lack of Transportation (Non-Medical): Yes  Physical Activity: Not on file  Stress: Not on file  Social Connections: Unknown (10/14/2021)   Received from [facility]   Social Connections    Do your friends and family support you?: Not on file    What agencies support you?: Not on file    Current Medications: Current Facility-Administered Medications  Medication Dose Route Frequency Provider Last Rate Last Admin   acetaminophen  (TYLENOL ) tablet  650 mg  650 mg Oral Q6H PRN White, Patrice L, NP   650 mg at 03/31/24 1342   alum & mag hydroxide-simeth (MAALOX/MYLANTA) 200-200-20 MG/5ML suspension 30 mL  30 mL Oral Q4H PRN White, Patrice L, NP   30 mL at 03/24/24 0217   benztropine  (COGENTIN ) tablet 1 mg  1 mg Oral BID White, Patrice L, NP   1 mg at 04/11/24 0757   divalproex  (DEPAKOTE ) DR tablet 750 mg  750 mg Oral Q12H Ji, Andrew, MD   750 mg at 04/11/24 0757   haloperidol  (HALDOL ) tablet 10 mg  10 mg Oral Q12H Ji, Andrew, MD   10 mg at 04/11/24 9242   hydrOXYzine  (ATARAX ) tablet 25 mg  25 mg Oral TID PRN White, Patrice L, NP   25 mg at 04/10/24 2030   magnesium  hydroxide (MILK OF MAGNESIA) suspension 30 mL  30 mL Oral Daily PRN White, Patrice L, NP       nicotine  polacrilex (NICORETTE ) gum 2 mg  2 mg Oral PRN Trudy Carwin, NP   2 mg at 03/28/24 1716   OLANZapine  (ZYPREXA ) injection 10 mg  10 mg Intramuscular TID PRN White, Patrice L, NP       OLANZapine  (ZYPREXA ) injection 5 mg  5 mg Intramuscular TID PRN White, Patrice L, NP       OLANZapine  zydis (ZYPREXA ) disintegrating tablet 5 mg  5 mg Oral TID PRN White, Patrice L, NP   5 mg at 04/06/24 2128   ondansetron  (ZOFRAN -ODT) disintegrating tablet 4 mg  4 mg Oral Q8H PRN Lynnette Barter, MD   4 mg  at 04/02/24 1946   traZODone  (DESYREL ) tablet 50 mg  50 mg Oral QHS White, Patrice L, NP   50 mg at 04/10/24 2030   ziprasidone  (GEODON ) capsule 60 mg  60 mg Oral BID WC Marry Clamp, MD   60 mg at 04/11/24 0757    Lab Results:  No results found for this or any previous visit (from the past 48 hours).   Blood Alcohol level:  Lab Results  Component Value Date   [facility] <15 02/17/2024   ETH <15 01/14/2024    Metabolic Disorder Labs: Lab Results  Component Value Date   HGBA1C 5.1 01/18/2024   MPG 99.67 01/18/2024   MPG 105.41 05/11/2019   Lab Results  Component Value Date   PROLACTIN 24.1 (H) 05/11/2019   Lab Results  Component Value Date   CHOL 154 01/18/2024   TRIG 180 (H) 01/18/2024   HDL 49 01/18/2024   CHOLHDL 3.1 01/18/2024   VLDL 36 01/18/2024   LDLCALC 69 01/18/2024   LDLCALC 82 05/11/2019    Mental Status exam: Appearance: white female of slightly elevated BMI, fair hygiene, poor dentition seen ambulating to the day room for group Eye contact: good  Attitude towards examiner  cooperative, pleasant Psychomotor: no agitation or retardation Speech: reduced amount, infrequent one-word responses  Language: simplistic  Mood: good Affect: was euthymic, smiling  Thought content: denying SI, HI, not overtly expressing delusional content Thought Process: remains confused and concrete, childlike  Perception: denying AVH, not overtly RTIS Insight: poor  Judgement: poor   Orientation: to self and hospital only - not able to accurately report date, situation Attention/Concentration: limited due to cognitive deficits   Memory/Cognition: poor - patient's most recent MOCA on 9/17 was an 8 with significant impairments in attention and memory.  Fund of Knowledge: below average     Musculoskeletal: Strength & Muscle Tone: within normal limits  Gait & Station: normal Patient leans: N/A    Physical Exam Constitutional:      Appearance: the patient is not  toxic-appearing.  Pulmonary:     Effort: Pulmonary effort is normal.  Neurological:     General: No focal deficit present.     Oriented to self and hospital only, does not verbalize date or situation HEENT:     Eyes EOMI   Review of Systems  Respiratory:  Negative for shortness of breath.   Cardiovascular:  Negative for chest pain.  Gastrointestinal:  Negative for abdominal pain, constipation, diarrhea, nausea and vomiting.  Neurological:  Negative for headaches.   Blood pressure (!) 115/93, pulse 90, temperature 98.6 F (37 C), temperature source Oral, resp. rate 16, height 5' 6 (1.676 m), weight 80.7 kg, SpO2 98%. Body mass index is 28.71 kg/m.   Treatment Plan Summary: Daily contact with patient to assess and evaluate symptoms and progress in treatment and Medication management  Assessment Allison Bolton is a 54 yr old female who presented on 02/17/24 to [facility] ED with a chief complaint of anxiety. In the ED, she was found to be acutely psychotic with tangential speech, agitation, and delusional thought content. Patient was admitted involuntarily to [facility] on 8/14.  Although substance-induced psychosis has previously been documented, she additional presents with prior reported manic episodes and a bipolar 1 diagnosis has been most reflective of this. On this admission, presented with agitation and increased rate of speech, initially had significantly poor sleep and psychotic symptoms, disorganization of thoughts, behavior, concern for AVH.  After approximately 1 week manic symptoms had largely resolved with antipsychotic medications and mood stabilizers however she remained with disorganization in thoughts and behavior and poor self care initially concerning for ongoing psychosis but after extended observation appears more in line with underlying major neurocognitive disorder due to multiple etiologies. She has significant cognitive impairments with most recent MOCA scoring at 8  and recommendation for 24 hr supervision and help/prompting ADLs. She has been noted to be childlike in her interactions and requires direction to eat and shower but is otherwise pleasant and cooperative with care with no overt psychotic symptoms. Suspect multifactorial causes of cognitive impairment including longstanding substance use, recurrent manic/psychotic episodes, possible underlying low functioning (unknown).   Over the last 2-3 weeks the patient has been pleasant, denying AVH, somewhat more engaged in exam and with better eye contact. She has not been seen to be RTIS or overtly expressing delusional content. Presentation remains reflective of treated bipolar disorder and ongoing neurocognitive impairments. At this time we will pursue discharge and recommendation for SNF vs group home and level 2 has been filled out - virtual meeting from prospective group home completed, appeared to go well. In-person meeting today @ 4pm for potential group home and guardianship hearing on Tuesday. PPD ordered and given 10/2, read 10/5 within 72 hrs (plan of care nursing note) with no redness or induration noted.    DSM-5 diagnoses: Bipolar 1 Disorder, most recent episode manic, severe, with psychotic features    -currently resolved: no active symptoms of mania or psychosis, just cognitive deficits 2.  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 History of substance induced psychotic disorder   Plan: Legal status: IVC - renewed 9/30 for an additional 14 days  Psychiatric medications:  -Continue Haldol  10 mg q12 for psychosis and mood stability -Continue Geodon  60 mg bid -Continue Depakote  DR 750 mg q12 for mood stability -Continue Cogentin  1 mg BID  for Drug Induced EPS -Continue Agitation Protocol: Zyprexa    Nicotine  Dependence: -Continue Nicotine  Gum 2 mg PRN  Additional PRNs -Continue Zofran  4 mg q8 PRN nausea/vomiting -Continue PRN's: Tylenol , Maalox, Atarax , Milk of  Magnesia, Trazodone    --  The risks/benefits/side-effects/alternatives to medications were discussed in detail with the patient and time was given for questions. The patient consents to medication trials.                -- Metabolic profile and EKG monitoring obtained while on an atypical antipsychotic (BMI: 28.73 Lipid Panel: WNL except Trig: 180 HbgA1c: 5.1)              -- Encouraged patient to participate in unit milieu and in scheduled group therapies              -- Short Term Goals: Ability to identify changes in lifestyle to reduce recurrence of condition will improve, Ability to verbalize feelings will improve, Ability to disclose and discuss suicidal ideas, Ability to demonstrate self-control will improve, Ability to identify and develop effective coping behaviors will improve, Ability to maintain clinical measurements within normal limits will improve, Compliance with prescribed medications will improve, and Ability to identify triggers associated with substance abuse/mental health issues will improve             -- Long Term Goals: Improvement in symptoms so as ready for discharge   Safety and Monitoring:             -- Involuntary admission to inpatient psychiatric unit for safety, stabilization and treatment             -- Daily contact with patient to assess and evaluate symptoms and progress in treatment             -- Patient's case to be discussed in multi-disciplinary team meeting             -- Observation Level : q15 minute checks             -- Vital signs:  q12 hours             -- Precautions: suicide, elopement, and assault  Discharge Planning:             -appreciate SW assistance in discharging  [facility] SW now involved and will be getting the patient a guardian and assisting in finding placement. Level 2 filled out . Next meeting with group home in-person 10/6 @ 4pm and guardianship hearing on 10/7   Allison LOISE Arts, MD 04/11/2024, 8:18 AM

## 2024-04-12 DIAGNOSIS — F0280 Dementia in other diseases classified elsewhere without behavioral disturbance: Secondary | ICD-10-CM | POA: Diagnosis not present

## 2024-04-12 DIAGNOSIS — F319 Bipolar disorder, unspecified: Secondary | ICD-10-CM | POA: Diagnosis not present

## 2024-04-12 DIAGNOSIS — F17200 Nicotine dependence, unspecified, uncomplicated: Secondary | ICD-10-CM | POA: Diagnosis not present

## 2024-04-12 DIAGNOSIS — F19959 Other psychoactive substance use, unspecified with psychoactive substance-induced psychotic disorder, unspecified: Secondary | ICD-10-CM | POA: Diagnosis not present

## 2024-04-12 NOTE — Progress Notes (Signed)
(  Sleep Hours) - 10.5 (Any PRNs that were needed, meds refused, or side effects to meds)- PRN vistaril  25 mg given at pt request, no meds refused.  (Any disturbances and when (visitation, over night)- None  (Concerns raised by the patient)- None  (SI/HI/AVH)- Denies SI/HI/AVH

## 2024-04-12 NOTE — Progress Notes (Signed)
   04/12/24 2200  Psych Admission Type (Psych Patients Only)  Admission Status Involuntary  Psychosocial Assessment  Patient Complaints None  Eye Contact Fair  Facial Expression Animated;Fixed smile  Affect Preoccupied  Speech Soft  Interaction Childlike  Motor Activity Slow  Appearance/Hygiene In hospital gown;Disheveled  Behavior Characteristics Cooperative  Mood Pleasant;Preoccupied  Thought Process  Coherency Disorganized  Content Preoccupation  Delusions None reported or observed  Perception Depersonalization  Hallucination None reported or observed  Judgment Impaired  Confusion Mild  Danger to Self  Current suicidal ideation? Denies  Agreement Not to Harm Self Yes  Description of Agreement Verbal  Danger to Others  Danger to Others None reported or observed

## 2024-04-12 NOTE — Progress Notes (Addendum)
 Guardianship Court Hearing - [employer] - via phone  Patient was present.  Patient was appointed interim legal guardian, [place].  Next court hearing:  05/04/2024.    Nazario Russom, LCSWA 04/12/2024

## 2024-04-12 NOTE — Progress Notes (Signed)
   04/12/24 9077  Psych Admission Type (Psych Patients Only)  Admission Status Involuntary  Psychosocial Assessment  Patient Complaints None  Eye Contact Fair  Facial Expression Fixed smile  Affect Preoccupied  Speech Soft  Interaction Childlike  Motor Activity Slow  Appearance/Hygiene Unremarkable  Behavior Characteristics Cooperative  Mood Preoccupied;Pleasant  Thought Process  Coherency Disorganized  Content Preoccupation  Delusions None reported or observed  Perception Depersonalization  Hallucination None reported or observed  Judgment Impaired  Confusion Mild  Danger to Self  Current suicidal ideation? Denies  Agreement Not to Harm Self Yes  Description of Agreement Verbal  Danger to Others  Danger to Others None reported or observed

## 2024-04-12 NOTE — Group Note (Signed)
 Date:  04/12/2024 Time:  8:34 PM  Group Topic/Focus:  Wrap-Up Group:   The focus of this group is to help patients review their daily goal of treatment and discuss progress on daily workbooks.    Participation Level:  Did Not Attend  Participation Quality:  Did Not Attend  Affect:  Did Not Attend  Cognitive:  Did Not Attend   Insight: None  Engagement in Group:  Did Not Attend  Modes of Intervention:  Did Not Attend  Additional Comments:  Pt was encouraged to attend wrap up group but did not attend  Lonni Na 04/12/2024, 8:34 PM

## 2024-04-12 NOTE — Progress Notes (Signed)
 [facility] MD Progress Note  04/12/2024 3:26 PM Allison Bolton  MRN:  [id number] Subjective:   Allison Bolton is a 53 yr old female who presented on 02/17/24 to [facility] ED with a chief complaint of anxiety. In the ED, she was found to be acutely psychotic with tangential speech, agitation, and delusional thought content. Patient was admitted involuntarily to [facility] on 8/14.  PPHx is significant for Substance-Induced Psychosis, Bipolar I Disorder, Stimulant Use Disorder, GAD, Suicide Attempt (2007), and Prior Psychiatric Hospitalizations (last- [facility] 01/2024).   Case was discussed in the multidisciplinary team. MAR was reviewed and patient was compliant with medications.  She received PRN Hydroxyzine  yesterday.   Psychiatric Team made the following recommendations yesterday: -Continue Haldol  10 mg q12 for psychosis and mood stability -Continue Geodon  60 mg BID for psychosis and mood stability -Continue Depakote  DR 750 mg q12 for mood stability -Continue Cogentin  1 mg BID for Drug Induced EPS -Continue Nitrofurantoin  100 mg BID for 5 days (Day 3 of 5)    On interview today patient reports she slept good last night.  She reports her appetite is doing good.  She reports no SI, HI, or AVH.  She reports no Paranoia or Ideas of Reference.  She reports no issues with her medications.  She reports that she enjoyed Pet therapy today.  She reports no other concerns at present.     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 The direct patient care time included face-to-face time with the patient, reviewing the patient's chart, communicating with other  professionals, and coordinating care.    Past Psychiatric History:  Substance-Induced Psychosis, Bipolar I Disorder, Stimulant Use Disorder, GAD, Suicide Attempt (2007), and Prior Psychiatric Hospitalizations (last- [facility] 01/2024).  Past Medical History:  Past Medical History:  Diagnosis Date   Anxiety    Arthritis    Asthma    Bipolar 1 disorder (HCC)    Diverticulosis    Dysrhythmia    sts I have heart palpitations   Fibromyalgia    H/O hiatal hernia    4   Headache(784.0)    High blood pressure    Meniere's disease    S/P colonoscopy    Dr. Golda 2010: few small diverticula at sigmoid. otherwise normal.    Shortness of breath     Past Surgical History:  Procedure Laterality Date   ABDOMINAL HYSTERECTOMY     APPENDECTOMY     CESAREAN SECTION     CHOLECYSTECTOMY     COLONOSCOPY N/A 12/25/2023   Procedure: COLONOSCOPY;  Surgeon: Eartha Angelia Sieving, MD;  Location: [facility] ENDO SUITE;  Service: Gastroenterology;  Laterality: N/A;  11:30am, asa 1   exploratory laparoscopy     FOOT SURGERY     HEMORRHOID SURGERY     LAPAROSCOPIC APPENDECTOMY  02/19/2011   Procedure: APPENDECTOMY LAPAROSCOPIC;  Surgeon: Oneil DELENA Budge;  Location: [facility] ORS;  Service: General;  Laterality: N/A;   LAPAROSCOPY  02/19/2011   Procedure: LAPAROSCOPY DIAGNOSTIC;  Surgeon: Oneil DELENA Budge;  Location: [facility] ORS;  Service: General;  Laterality: N/A;   left ovarian removal     multiple hernia repairs     Right ovarian removal     TONSILLECTOMY  TONSILLECTOMY AND ADENOIDECTOMY     tubes in ears     UMBILICAL HERNIA REPAIR  Dec 2011   Dr. Mavis   wisdom teeth removal     Family History:  Family History  Problem Relation Age of Onset   Thyroid  disease Mother    Colon cancer Father    Breast cancer Maternal Aunt    Colon cancer Brother    Colon cancer Other        paternal and maternal grandfather   Meniere's disease Other    Anesthesia problems Neg Hx    Hypotension Neg Hx    Malignant  hyperthermia Neg Hx    Pseudochol deficiency Neg Hx    Family Psychiatric  History:  None Reported   Social History:  Social History   Substance and Sexual Activity  Alcohol Use No   Comment: not since June 2012     Social History   Substance and Sexual Activity  Drug Use Yes   Types: Marijuana   Comment: patient denies any-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    Social History   Socioeconomic History   Marital status: Legally Separated    Spouse name: Not on file   Number of children: Not on file   Years of education: Not on file   Highest education level: Not on file  Occupational History   Not on file  Tobacco Use   Smoking status: Every Day    Current packs/day: 0.00    Average packs/day: 0.5 packs/day for 37.3 years (18.7 ttl pk-yrs)    Types: Cigarettes    Start date: 07/07/1981    Last attempt to quit: 11/05/2018    Years since quitting: 5.4   Smokeless tobacco: Never  Vaping Use   Vaping status: Never Used  Substance and Sexual Activity   Alcohol use: No    Comment: not since June 2012   Drug use: Yes    Types: Marijuana    Comment: patient denies any-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   Sexual activity: Never    Birth control/protection: Surgical  Other Topics Concern   Not on file  Social History Narrative   Not on file   Social Drivers of Health   Financial Resource Strain: Not on file  Food Insecurity: Food Insecurity Present (02/18/2024)   Hunger Vital Sign    Worried About Running Out of Food in the Last Year: Sometimes true    Ran Out of Food in the Last Year: Sometimes true  Transportation Needs: Unmet Transportation Needs (02/18/2024)   PRAPARE - Administrator, Civil Service (Medical): Yes    Lack of Transportation (Non-Medical): Yes  Physical Activity: Not on file  Stress: Not on file  Social Connections: Unknown (10/14/2021)   Received from [facility]   Social Connections    Do your friends and  family support you?: Not on file    What agencies support you?: Not on file   Additional Social History:                         Sleep: Good Estimated Sleeping Duration (Last 24 Hours): 7.25-8.50 hours  Appetite:  Good  Current Medications: Current Facility-Administered Medications  Medication Dose Route Frequency Provider Last Rate Last Admin   acetaminophen  (TYLENOL ) tablet 650 mg  650 mg Oral Q6H PRN White, Patrice L, NP   650 mg at 04/11/24 1831  alum & mag hydroxide-simeth (MAALOX/MYLANTA) 200-200-20 MG/5ML suspension 30 mL  30 mL Oral Q4H PRN White, Patrice L, NP   30 mL at 03/24/24 0217   benztropine  (COGENTIN ) tablet 1 mg  1 mg Oral BID White, Patrice L, NP   1 mg at 04/12/24 0830   divalproex  (DEPAKOTE ) DR tablet 750 mg  750 mg Oral Q12H Ji, Andrew, MD   750 mg at 04/12/24 0830   haloperidol  (HALDOL ) tablet 10 mg  10 mg Oral Q12H Ji, Andrew, MD   10 mg at 04/12/24 0830   hydrOXYzine  (ATARAX ) tablet 25 mg  25 mg Oral TID PRN White, Patrice L, NP   25 mg at 04/11/24 2048   magnesium  hydroxide (MILK OF MAGNESIA) suspension 30 mL  30 mL Oral Daily PRN White, Patrice L, NP       nicotine  polacrilex (NICORETTE ) gum 2 mg  2 mg Oral PRN Trudy Carwin, NP   2 mg at 03/28/24 1716   OLANZapine  (ZYPREXA ) injection 10 mg  10 mg Intramuscular TID PRN White, Patrice L, NP       OLANZapine  (ZYPREXA ) injection 5 mg  5 mg Intramuscular TID PRN White, Patrice L, NP       OLANZapine  zydis (ZYPREXA ) disintegrating tablet 5 mg  5 mg Oral TID PRN White, Patrice L, NP   5 mg at 04/06/24 2128   ondansetron  (ZOFRAN -ODT) disintegrating tablet 4 mg  4 mg Oral Q8H PRN Lynnette Barter, MD   4 mg at 04/02/24 1946   traZODone  (DESYREL ) tablet 50 mg  50 mg Oral QHS White, Patrice L, NP   50 mg at 04/11/24 2048   ziprasidone  (GEODON ) capsule 60 mg  60 mg Oral BID WC Marry Clamp, MD   60 mg at 04/12/24 0830    Lab Results:  No results found for this or any previous visit (from the past 48  hours).    Blood Alcohol level:  Lab Results  Component Value Date   [facility] <15 02/17/2024   ETH <15 01/14/2024    Metabolic Disorder Labs: Lab Results  Component Value Date   HGBA1C 5.1 01/18/2024   MPG 99.67 01/18/2024   MPG 105.41 05/11/2019   Lab Results  Component Value Date   PROLACTIN 24.1 (H) 05/11/2019   Lab Results  Component Value Date   CHOL 154 01/18/2024   TRIG 180 (H) 01/18/2024   HDL 49 01/18/2024   CHOLHDL 3.1 01/18/2024   VLDL 36 01/18/2024   LDLCALC 69 01/18/2024   LDLCALC 82 05/11/2019    Physical Findings: AIMS:  ,  ,  ,  ,  ,  ,   CIWA:    COWS:     Musculoskeletal: Strength & Muscle Tone: within normal limits Gait & Station: normal Patient leans: N/A  Psychiatric Specialty Exam:  Presentation  General Appearance:  Disheveled  Eye Contact: Fair  Speech: Normal Rate  Speech Volume: Normal  Handedness:No data recorded  Mood and Affect  Mood: Euthymic  Affect: Other (comment) (always smiling)   Thought Process  Thought Processes: Linear  Descriptions of Associations:Loose  Orientation:Partial  Thought Content:Scattered  History of Schizophrenia/Schizoaffective disorder:No  Duration of Psychotic Symptoms:Greater than six months  Hallucinations:Hallucinations: None   Ideas of Reference:None  Suicidal Thoughts:Suicidal Thoughts: No   Homicidal Thoughts:Homicidal Thoughts: No    Sensorium  Memory: Immediate Poor; Recent Poor  Judgment: Impaired  Insight: Lacking   Executive Functions  Concentration: Fair  Attention Span: Fair  Recall: Poor  Fund of Knowledge:  Poor  Language: Fair   Psychomotor Activity  Psychomotor Activity:Psychomotor Activity: Normal    Assets  Assets: Resilience   Sleep  Sleep:Sleep: Good     Physical Exam: Physical Exam Vitals and nursing note reviewed.  Constitutional:      General: She is not in acute distress.    Appearance: She is not  toxic-appearing.  HENT:     Head: Normocephalic and atraumatic.  Pulmonary:     Effort: Pulmonary effort is normal.  Neurological:     Mental Status: She is alert.    Review of Systems  Respiratory:  Negative for cough and shortness of breath.   Cardiovascular:  Negative for chest pain.  Gastrointestinal:  Negative for abdominal pain, constipation, diarrhea, nausea and vomiting.  Neurological:  Negative for dizziness, weakness and headaches.  Psychiatric/Behavioral:  Negative for depression, hallucinations and suicidal ideas. The patient is not nervous/anxious.    Blood pressure 110/86, pulse 74, temperature 97.7 F (36.5 C), temperature source Oral, resp. rate 16, height 5' 6 (1.676 m), weight 80.7 kg, SpO2 93%. Body mass index is 28.71 kg/m.   Treatment Plan Summary: Daily contact with patient to assess and evaluate symptoms and progress in treatment and Medication management  Allison Bolton is a 53 yr old female who presented on 02/17/24 to [facility] ED with a chief complaint of anxiety. In the ED, she was found to be acutely psychotic with tangential speech, agitation, and delusional thought content. Patient was admitted involuntarily to [facility] on 8/14.  PPHx is significant for Substance-Induced Psychosis, Bipolar I Disorder, Stimulant Use Disorder, GAD, Suicide Attempt (2007), and Prior Psychiatric Hospitalizations (last- [facility] 01/2024).   Syliva continues to be pleasant but requires significant prompting for ADL's.  Informed by Social Work that DSS is obtaining Guardianship and she will have a bed at a group home on Oct 20.  We will not make any changes to her medications at this time.  We will continue to monitor.   Substance-induced psychosis  Bipolar disorder: -Continue Haldol  10 mg q12 for psychosis and mood stability -Continue Geodon  60 mg BID for psychosis and mood stability -Continue Depakote  DR 750 mg q12 for mood stability -Continue Cogentin  1 mg BID for Drug  Induced EPS -Continue Agitation Protocol: Zyprexa    Dementia in other diseases classified elsewhere, unspecified severity, without behavioral disturbance, psychotic disturbance, mood disturbance, and anxiety: Jodine was given a MOCA on 9/17 and scored an 8.  [facility] SW is seeking a guardian for the patient as she needs 24 hour assistance at this point and they are looking for placement at either SNF or group home.     Nicotine  Dependence: -Continue Nicotine  Gum 2 mg PRN   -Continue Zofran  4 mg q8 PRN nausea/vomiting -Continue PRN's: Tylenol , Maalox, Atarax , Milk of Magnesia, Trazodone    --  The risks/benefits/side-effects/alternatives to medications were discussed in detail with the patient and time was given for questions. The patient consents to medication trials.                -- Metabolic profile and EKG monitoring obtained while on an atypical antipsychotic (BMI: 28.73 Lipid Panel: WNL except Trig: 180 HbgA1c: 5.1)              -- Encouraged patient to participate in unit milieu and in scheduled group therapies              -- Short Term Goals: Ability to identify changes in lifestyle to reduce recurrence of condition will improve,  Ability to verbalize feelings will improve, Ability to disclose and discuss suicidal ideas, Ability to demonstrate self-control will improve, Ability to identify and develop effective coping behaviors will improve, Ability to maintain clinical measurements within normal limits will improve, Compliance with prescribed medications will improve, and Ability to identify triggers associated with substance abuse/mental health issues will improve             -- Long Term Goals: Improvement in symptoms so as ready for discharge   Safety and Monitoring:             -- Involuntary admission to inpatient psychiatric unit for safety, stabilization and treatment             -- Daily contact with patient to assess and evaluate symptoms and progress in treatment              -- Patient's case to be discussed in multi-disciplinary team meeting             -- Observation Level : q15 minute checks             -- Vital signs:  q12 hours             -- Precautions: suicide, elopement, and assault  Discharge Planning:              -- Social work and case management to assist with discharge planning and identification of hospital follow-up needs prior to discharge             -- Estimated LOS: 7+ more days             -- Discharge Concerns: Need to establish a safety plan; Medication compliance and effectiveness             -- Discharge Goals: Return home with outpatient referrals for mental health follow-up including medication management/psychotherapy   Marsa GORMAN Rosser, DO 04/12/2024, 3:26 PM

## 2024-04-12 NOTE — Progress Notes (Signed)
 Collateral contact - Izetta Roses ([employer]SS Adult Protective Services Social Worker) [phone], [email or web address]  Katie left a voicemail stating that patient's interim court hearing is scheduled for today, 04/12/2024, at 9:30 AM.  The actual court hearing is scheduled for 05/04/2024.     Radames Mejorado, LCSWA 02/24/2024

## 2024-04-12 NOTE — Group Note (Signed)
 Recreation Therapy Group Note   Group Topic:Leisure Education  Group Date: 04/12/2024 Start Time: 1040 End Time: 1105 Facilitators: Lyrik Buresh-McCall, LRT,CTRS Location: [address]   Group Topic: Leisure Education   Goal Area(s) Addresses:  Patient will successfully identify positive leisure and recreation activities.  Patient will acknowledge benefits of participation in healthy leisure activities post discharge.  Patient will actively work with peers toward a shared goal.   Behavioral Response:    Intervention: Cooperative Group Game    Activity: Keep It Contractor. LRT and patients discussed what leisure was and what prevents people from taking time for leisure. The group then engaged in a game of volleyball. The object of the game is to keep the ball in motion at all times. The ball can be bounced off the floor but it can't come to a complete stop. LRT will be timing the group as they play the game. If the ball were to come to a stop, the time would start over.   Education:  Teacher, English as a foreign language, Leisure as Merchant navy officer, Programmer, applications, Building control surveyor   Education Outcome: Acknowledges education/In group clarification offered/Needs additional education   Affect/Mood: N/A   Participation Level: Did not attend    Clinical Observations/Individualized Feedback:      Plan: Continue to engage patient in RT group sessions 2-3x/week.   Eulas Schweitzer-McCall, LRT,CTRS 04/12/2024 1:37 PM

## 2024-04-12 NOTE — Group Note (Signed)
 Recreation Therapy Group Note   Group Topic:Animal Assisted Therapy   Group Date: 04/12/2024 Start Time: 9050 End Time: 1030 Facilitators: Hammad Finkler-McCall, LRT,CTRS Location: [address] Dayroom   Animal-Assisted Activity (AAA) Program Checklist/Progress Notes Patient Eligibility Criteria Checklist & Daily Group note for Rec Tx Intervention  AAA/T Program Assumption of Risk Form signed by Patient/ or Parent Legal Guardian Yes  Patient is free of allergies or severe asthma Yes  Patient reports no fear of animals Yes  Patient reports no history of cruelty to animals Yes  Patient understands his/her participation is voluntary Yes  Patient washes hands before animal contact Yes  Patient washes hands after animal contact Yes  Behavioral Response: Active   Education: Charity fundraiser, Appropriate Animal Interaction   Education Outcome: Acknowledges education.    Affect/Mood: Appropriate   Participation Level: Active   Participation Quality: Independent   Behavior: Attentive    Speech/Thought Process: Delusional   Insight: Poor   Judgement: Poor   Modes of Intervention: Teaching laboratory technician   Patient Response to Interventions:  Attentive   Education Outcome:  In group clarification offered    Clinical Observations/Individualized Feedback: Pt was attentive and attempted to answer some the answers posed during group. 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     Plan: Continue to engage patient in RT group sessions 2-3x/week.   Akshat Minehart-McCall, LRT,CTRS 04/12/2024 1:15 PM

## 2024-04-12 NOTE — Plan of Care (Signed)
  Problem: Activity: Goal: Interest or engagement in activities will improve Outcome: Progressing Goal: Sleeping patterns will improve Outcome: Progressing   Problem: Coping: Goal: Ability to verbalize frustrations and anger appropriately will improve Outcome: Progressing Goal: Ability to demonstrate self-control will improve Outcome: Progressing   Problem: Safety: Goal: Periods of time without injury will increase Outcome: Progressing

## 2024-04-13 DIAGNOSIS — F0280 Dementia in other diseases classified elsewhere without behavioral disturbance: Secondary | ICD-10-CM | POA: Diagnosis not present

## 2024-04-13 DIAGNOSIS — F19959 Other psychoactive substance use, unspecified with psychoactive substance-induced psychotic disorder, unspecified: Secondary | ICD-10-CM | POA: Diagnosis not present

## 2024-04-13 NOTE — Plan of Care (Signed)
   Problem: Education: Goal: Emotional status will improve Outcome: Progressing Goal: Mental status will improve Outcome: Progressing   Problem: Activity: Goal: Sleeping patterns will improve Outcome: Progressing

## 2024-04-13 NOTE — Group Note (Signed)
 Recreation Therapy Group Note   Group Topic:Emotion Expression  Group Date: 04/13/2024 Start Time: 1035 End Time: 1115 Facilitators: Christle Nolting-McCall, LRT,CTRS Location: [address] Dayroom   Group Topic/Focus: Self Expression   Goal Area(s) Addresses:  Patient will be able to identify a variety of ways to express themselves.  Patient will successfully share why it is beneficial to express emotions.  Behavioral Response: Attentive  Intervention: Music  Activity: Music Therapy. Patients were given the opportunity to chose songs that they used to help express different feelings and emotions. As long as the songs were clean and appropriate, they were played during group.   Education: Communication, Discharge Planning  Educational Outcome: Acknowledges education   Affect/Mood: Appropriate   Participation Level: Active   Participation Quality: Independent   Behavior: Attentive    Speech/Thought Process: Focused   Insight: Good   Judgement: Good   Modes of Intervention: Music   Patient Response to Interventions:  Attentive   Education Outcome:  In group clarification offered    Clinical Observations/Individualized Feedback: Pt was social with peers and singing along to some of the songs. At one point, pt was dancing to the music as well.      Plan: Continue to engage patient in RT group sessions 2-3x/week.   Phylisha Dix-McCall, LRT,CTRS 04/13/2024 1:43 PM

## 2024-04-13 NOTE — Progress Notes (Signed)
(  Sleep Hours) - 11.5 (Any PRNs that were needed, meds refused, or side effects to meds)-  tylenol  650mg , atarax  25mg , no meds refused, no side effects to meds  (Any disturbances and when (visitation, over night)- n/a  (Concerns raised by the patient)- n/a  (SI/HI/AVH)- denies

## 2024-04-13 NOTE — Progress Notes (Signed)
   04/13/24 0900  Psych Admission Type (Psych Patients Only)  Admission Status Involuntary  Psychosocial Assessment  Patient Complaints None  Eye Contact Fair  Facial Expression Fixed smile;Animated  Affect Preoccupied  Speech Soft  Interaction Childlike  Motor Activity Slow  Appearance/Hygiene Disheveled  Behavior Characteristics Cooperative  Mood Preoccupied  Thought Process  Coherency Disorganized  Content Preoccupation  Delusions None reported or observed  Perception Depersonalization  Hallucination None reported or observed  Judgment Impaired  Confusion None  Danger to Self  Current suicidal ideation? Denies  Danger to Others  Danger to Others None reported or observed

## 2024-04-13 NOTE — Plan of Care (Signed)
   Problem: Education: Goal: Emotional status will improve Outcome: Progressing Goal: Mental status will improve Outcome: Progressing

## 2024-04-13 NOTE — Group Note (Signed)
 Date:  04/13/2024 Time:  8:28 PM  Group Topic/Focus:  Wrap-Up Group:   The focus of this group is to help patients review their daily goal of treatment and discuss progress on daily workbooks.    Participation Level:  Active  Participation Quality:  Appropriate  Affect:  Appropriate  Cognitive:  Appropriate  Insight: Appropriate  Engagement in Group:  Engaged  Modes of Intervention:  Education and Exploration  Additional Comments:  Patient attended and participated in group tonight. She reports that today she slept all day except for going to her meals.  Gwenn Chillington Dacosta 04/13/2024, 8:28 PM

## 2024-04-13 NOTE — Progress Notes (Signed)
 [facility] MD Progress Note  04/13/2024 1:21 PM Allison Bolton  MRN:  [id number] Subjective:   Allison Bolton is a 53 yr old female who presented on 02/17/24 to [facility] ED with a chief complaint of anxiety. In the ED, she was found to be acutely psychotic with tangential speech, agitation, and delusional thought content. Patient was admitted involuntarily to [facility] on 8/14.  PPHx is significant for Substance-Induced Psychosis, Bipolar I Disorder, Stimulant Use Disorder, GAD, Suicide Attempt (2007), and Prior Psychiatric Hospitalizations (last- [facility] 01/2024).   Case was discussed in the multidisciplinary team. MAR was reviewed and patient was compliant with medications.  She received PRN Hydroxyzine  and Tylenol  yesterday.   Psychiatric Team made the following recommendations yesterday: -Continue Haldol  10 mg q12 for psychosis and mood stability -Continue Geodon  60 mg BID for psychosis and mood stability -Continue Depakote  DR 750 mg q12 for mood stability -Continue Cogentin  1 mg BID for Drug Induced EPS -Continue Nitrofurantoin  100 mg BID for 5 days (Day 3 of 5)    On interview today patient reports she slept good last night.  She reports her appetite is doing good.  She reports no SI, HI, or AVH.  She reports no Paranoia or Ideas of Reference.  She reports no issues with her medications.  She reports no other concerns at present.      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 The direct patient care time included face-to-face time with the patient, reviewing the patient's chart, communicating with other professionals, and coordinating  care.    Past Psychiatric History:  Substance-Induced Psychosis, Bipolar I Disorder, Stimulant Use Disorder, GAD, Suicide Attempt (2007), and Prior Psychiatric Hospitalizations (last- [facility] 01/2024).  Past Medical History:  Past Medical History:  Diagnosis Date   Anxiety    Arthritis    Asthma    Bipolar 1 disorder (HCC)    Diverticulosis    Dysrhythmia    sts I have heart palpitations   Fibromyalgia    H/O hiatal hernia    4   Headache(784.0)    High blood pressure    Meniere's disease    S/P colonoscopy    Dr. Golda 2010: few small diverticula at sigmoid. otherwise normal.    Shortness of breath     Past Surgical History:  Procedure Laterality Date   ABDOMINAL HYSTERECTOMY     APPENDECTOMY     CESAREAN SECTION     CHOLECYSTECTOMY     COLONOSCOPY N/A 12/25/2023   Procedure: COLONOSCOPY;  Surgeon: Eartha Angelia Sieving, MD;  Location: [facility] ENDO SUITE;  Service: Gastroenterology;  Laterality: N/A;  11:30am, asa 1   exploratory laparoscopy     FOOT SURGERY     HEMORRHOID SURGERY     LAPAROSCOPIC APPENDECTOMY  02/19/2011   Procedure: APPENDECTOMY LAPAROSCOPIC;  Surgeon: Oneil DELENA Budge;  Location: [facility] ORS;  Service: General;  Laterality: N/A;   LAPAROSCOPY  02/19/2011   Procedure: LAPAROSCOPY DIAGNOSTIC;  Surgeon: Oneil DELENA Budge;  Location: [facility] ORS;  Service: General;  Laterality: N/A;   left ovarian removal     multiple hernia repairs     Right ovarian removal     TONSILLECTOMY     TONSILLECTOMY AND  ADENOIDECTOMY     tubes in ears     UMBILICAL HERNIA REPAIR  Dec 2011   Dr. Mavis   wisdom teeth removal     Family History:  Family History  Problem Relation Age of Onset   Thyroid  disease Mother    Colon cancer Father    Breast cancer Maternal Aunt    Colon cancer Brother    Colon cancer Other        paternal and maternal grandfather   Meniere's disease Other    Anesthesia problems Neg Hx    Hypotension Neg Hx    Malignant hyperthermia Neg Hx    Pseudochol  deficiency Neg Hx    Family Psychiatric  History:  None Reported   Social History:  Social History   Substance and Sexual Activity  Alcohol Use No   Comment: not since June 2012     Social History   Substance and Sexual Activity  Drug Use Yes   Types: Marijuana   Comment: patient denies any-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    Social History   Socioeconomic History   Marital status: Legally Separated    Spouse name: Not on file   Number of children: Not on file   Years of education: Not on file   Highest education level: Not on file  Occupational History   Not on file  Tobacco Use   Smoking status: Every Day    Current packs/day: 0.00    Average packs/day: 0.5 packs/day for 37.3 years (18.7 ttl pk-yrs)    Types: Cigarettes    Start date: 07/07/1981    Last attempt to quit: 11/05/2018    Years since quitting: 5.4   Smokeless tobacco: Never  Vaping Use   Vaping status: Never Used  Substance and Sexual Activity   Alcohol use: No    Comment: not since June 2012   Drug use: Yes    Types: Marijuana    Comment: patient denies any-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   Sexual activity: Never    Birth control/protection: Surgical  Other Topics Concern   Not on file  Social History Narrative   Not on file   Social Drivers of Health   Financial Resource Strain: Not on file  Food Insecurity: Food Insecurity Present (02/18/2024)   Hunger Vital Sign    Worried About Running Out of Food in the Last Year: Sometimes true    Ran Out of Food in the Last Year: Sometimes true  Transportation Needs: Unmet Transportation Needs (02/18/2024)   PRAPARE - Administrator, Civil Service (Medical): Yes    Lack of Transportation (Non-Medical): Yes  Physical Activity: Not on file  Stress: Not on file  Social Connections: Unknown (10/14/2021)   Received from [facility]   Social Connections    Do your friends and family support you?: Not on file     What agencies support you?: Not on file   Additional Social History:                         Sleep: Good Estimated Sleeping Duration (Last 24 Hours): 8.50-9.75 hours  Appetite:  Good  Current Medications: Current Facility-Administered Medications  Medication Dose Route Frequency Provider Last Rate Last Admin   acetaminophen  (TYLENOL ) tablet 650 mg  650 mg Oral Q6H PRN White, Patrice L, NP   650 mg at 04/12/24 2139   alum &  mag hydroxide-simeth (MAALOX/MYLANTA) 200-200-20 MG/5ML suspension 30 mL  30 mL Oral Q4H PRN White, Patrice L, NP   30 mL at 03/24/24 0217   benztropine  (COGENTIN ) tablet 1 mg  1 mg Oral BID White, Patrice L, NP   1 mg at 04/13/24 0840   divalproex  (DEPAKOTE ) DR tablet 750 mg  750 mg Oral Q12H Ji, Andrew, MD   750 mg at 04/13/24 9161   haloperidol  (HALDOL ) tablet 10 mg  10 mg Oral Q12H Ji, Andrew, MD   10 mg at 04/13/24 0840   hydrOXYzine  (ATARAX ) tablet 25 mg  25 mg Oral TID PRN White, Patrice L, NP   25 mg at 04/12/24 2024   magnesium  hydroxide (MILK OF MAGNESIA) suspension 30 mL  30 mL Oral Daily PRN White, Patrice L, NP       nicotine  polacrilex (NICORETTE ) gum 2 mg  2 mg Oral PRN Trudy Carwin, NP   2 mg at 03/28/24 1716   OLANZapine  (ZYPREXA ) injection 10 mg  10 mg Intramuscular TID PRN White, Patrice L, NP       OLANZapine  (ZYPREXA ) injection 5 mg  5 mg Intramuscular TID PRN White, Patrice L, NP       OLANZapine  zydis (ZYPREXA ) disintegrating tablet 5 mg  5 mg Oral TID PRN White, Patrice L, NP   5 mg at 04/06/24 2128   ondansetron  (ZOFRAN -ODT) disintegrating tablet 4 mg  4 mg Oral Q8H PRN Lynnette Barter, MD   4 mg at 04/02/24 1946   traZODone  (DESYREL ) tablet 50 mg  50 mg Oral QHS White, Patrice L, NP   50 mg at 04/12/24 2025   ziprasidone  (GEODON ) capsule 60 mg  60 mg Oral BID WC Marry Clamp, MD   60 mg at 04/13/24 0840    Lab Results:  No results found for this or any previous visit (from the past 48 hours).    Blood Alcohol level:  Lab  Results  Component Value Date   [facility] <15 02/17/2024   ETH <15 01/14/2024    Metabolic Disorder Labs: Lab Results  Component Value Date   HGBA1C 5.1 01/18/2024   MPG 99.67 01/18/2024   MPG 105.41 05/11/2019   Lab Results  Component Value Date   PROLACTIN 24.1 (H) 05/11/2019   Lab Results  Component Value Date   CHOL 154 01/18/2024   TRIG 180 (H) 01/18/2024   HDL 49 01/18/2024   CHOLHDL 3.1 01/18/2024   VLDL 36 01/18/2024   LDLCALC 69 01/18/2024   LDLCALC 82 05/11/2019    Physical Findings: AIMS:  ,  ,  ,  ,  ,  ,   CIWA:    COWS:     Musculoskeletal: Strength & Muscle Tone: within normal limits Gait & Station: normal Patient leans: N/A  Psychiatric Specialty Exam:  Presentation  General Appearance:  Casual  Eye Contact: Fair  Speech: Normal Rate  Speech Volume: Normal  Handedness:No data recorded  Mood and Affect  Mood: Euthymic  Affect: Other (comment) (always smiling)   Thought Process  Thought Processes: Linear  Descriptions of Associations:Loose  Orientation:Partial  Thought Content:Scattered  History of Schizophrenia/Schizoaffective disorder:No  Duration of Psychotic Symptoms:Greater than six months  Hallucinations:Hallucinations: None   Ideas of Reference:None  Suicidal Thoughts:Suicidal Thoughts: No   Homicidal Thoughts:Homicidal Thoughts: No    Sensorium  Memory: Immediate Poor; Recent Poor  Judgment: Impaired  Insight: Lacking   Executive Functions  Concentration: Fair  Attention Span: Fair  Recall: Poor  Fund of Knowledge: Poor  Language: Fair   Psychomotor Activity  Psychomotor Activity:Psychomotor Activity: Normal    Assets  Assets: Resilience   Sleep  Sleep:Sleep: Good     Physical Exam: Physical Exam Vitals and nursing note reviewed.  Constitutional:      General: She is not in acute distress.    Appearance: Normal appearance. She is normal weight. She is not  ill-appearing or toxic-appearing.  HENT:     Head: Normocephalic and atraumatic.  Pulmonary:     Effort: Pulmonary effort is normal.  Musculoskeletal:        General: Normal range of motion.  Neurological:     Mental Status: She is alert.    Review of Systems  Respiratory:  Negative for cough and shortness of breath.   Cardiovascular:  Negative for chest pain.  Gastrointestinal:  Negative for abdominal pain, constipation, diarrhea, nausea and vomiting.  Neurological:  Negative for dizziness, weakness and headaches.  Psychiatric/Behavioral:  Negative for depression, hallucinations and suicidal ideas. The patient is not nervous/anxious.    Blood pressure (!) 102/51, pulse 78, temperature 97.7 F (36.5 C), temperature source Oral, resp. rate 16, height 5' 6 (1.676 m), weight 80.7 kg, SpO2 100%. Body mass index is 28.71 kg/m.   Treatment Plan Summary: Daily contact with patient to assess and evaluate symptoms and progress in treatment and Medication management  Allison Bolton is a 53 yr old female who presented on 02/17/24 to [facility] ED with a chief complaint of anxiety. In the ED, she was found to be acutely psychotic with tangential speech, agitation, and delusional thought content. Patient was admitted involuntarily to [facility] on 8/14.  PPHx is significant for Substance-Induced Psychosis, Bipolar I Disorder, Stimulant Use Disorder, GAD, Suicide Attempt (2007), and Prior Psychiatric Hospitalizations (last- [facility] 01/2024).   Jomaira continues to remain stable.  We continue to await bed placement at a group home.  We will not make any changes to her medications at this time.  We will continue to monitor.    Substance-induced psychosis  Bipolar disorder: -Continue Haldol  10 mg q12 for psychosis and mood stability -Continue Geodon  60 mg BID for psychosis and mood stability -Continue Depakote  DR 750 mg q12 for mood stability -Continue Cogentin  1 mg BID for Drug Induced EPS -Continue  Agitation Protocol: Zyprexa    Dementia in other diseases classified elsewhere, unspecified severity, without behavioral disturbance, psychotic disturbance, mood disturbance, and anxiety: Lanaiya was given a MOCA on 9/17 and scored an 8.  [facility] SW is seeking a guardian for the patient as she needs 24 hour assistance at this point and they are looking for placement at either SNF or group home.     Nicotine  Dependence: -Continue Nicotine  Gum 2 mg PRN   -Continue Zofran  4 mg q8 PRN nausea/vomiting -Continue PRN's: Tylenol , Maalox, Atarax , Milk of Magnesia, Trazodone    --  The risks/benefits/side-effects/alternatives to medications were discussed in detail with the patient and time was given for questions. The patient consents to medication trials.                -- Metabolic profile and EKG monitoring obtained while on an atypical antipsychotic (BMI: 28.73 Lipid Panel: WNL except Trig: 180 HbgA1c: 5.1)              -- Encouraged patient to participate in unit milieu and in scheduled group therapies              -- Short Term Goals: Ability to identify changes in lifestyle to reduce recurrence  of condition will improve, Ability to verbalize feelings will improve, Ability to disclose and discuss suicidal ideas, Ability to demonstrate self-control will improve, Ability to identify and develop effective coping behaviors will improve, Ability to maintain clinical measurements within normal limits will improve, Compliance with prescribed medications will improve, and Ability to identify triggers associated with substance abuse/mental health issues will improve             -- Long Term Goals: Improvement in symptoms so as ready for discharge   Safety and Monitoring:             -- Involuntary admission to inpatient psychiatric unit for safety, stabilization and treatment             -- Daily contact with patient to assess and evaluate symptoms and progress in treatment             -- Patient's case to  be discussed in multi-disciplinary team meeting             -- Observation Level : q15 minute checks             -- Vital signs:  q12 hours             -- Precautions: suicide, elopement, and assault  Discharge Planning:              -- Social work and case management to assist with discharge planning and identification of hospital follow-up needs prior to discharge             -- Estimated LOS: 7+ more days             -- Discharge Concerns: Need to establish a safety plan; Medication compliance and effectiveness             -- Discharge Goals: Return home with outpatient referrals for mental health follow-up including medication management/psychotherapy   Marsa GORMAN Rosser, DO 04/13/2024, 1:21 PM

## 2024-04-13 NOTE — Progress Notes (Signed)
   04/13/24 2200  Psych Admission Type (Psych Patients Only)  Admission Status Involuntary  Psychosocial Assessment  Patient Complaints None  Eye Contact Fair  Facial Expression Fixed smile;Animated  Affect Preoccupied  Speech Soft  Interaction Childlike  Motor Activity Slow  Appearance/Hygiene Disheveled  Behavior Characteristics Cooperative  Mood Pleasant;Preoccupied  Thought Process  Coherency Disorganized  Content Preoccupation  Delusions None reported or observed  Perception Depersonalization  Hallucination None reported or observed  Judgment Impaired  Confusion None  Danger to Self  Current suicidal ideation? Denies  Agreement Not to Harm Self Yes  Description of Agreement Verbal  Danger to Others  Danger to Others None reported or observed

## 2024-04-14 DIAGNOSIS — F0280 Dementia in other diseases classified elsewhere without behavioral disturbance: Secondary | ICD-10-CM | POA: Diagnosis not present

## 2024-04-14 DIAGNOSIS — F19959 Other psychoactive substance use, unspecified with psychoactive substance-induced psychotic disorder, unspecified: Secondary | ICD-10-CM | POA: Diagnosis not present

## 2024-04-14 LAB — QUANTIFERON-TB GOLD PLUS (RQFGPL)
QuantiFERON Mitogen Value: >10 [IU]/mL
QuantiFERON Nil Value: 0.05 [IU]/mL
QuantiFERON TB1 Ag Value: 0.04 [IU]/mL
QuantiFERON TB2 Ag Value: 0.03 [IU]/mL

## 2024-04-14 LAB — QUANTIFERON-TB GOLD PLUS: QuantiFERON-TB Gold Plus: NEGATIVE

## 2024-04-14 NOTE — Plan of Care (Signed)
   Problem: Education: Goal: Emotional status will improve Outcome: Progressing Goal: Mental status will improve Outcome: Progressing   Problem: Activity: Goal: Interest or engagement in activities will improve Outcome: Progressing

## 2024-04-14 NOTE — Progress Notes (Signed)
 Tour of Duty:  [name], RN, 04/14/24, Tour of Duty: 0700-1900  SI/HI/AVH: Denies  Self-Reported   Mood: Positive  Anxiety: Denies Depression: Denies Irritability: Denies  Broset  Violence Prevention Guidelines *See Row Information*: Small Violence Risk interventions implemented   LBM  Last BM Date : 04/13/24   Pain: not present  Patient Refusals (including Rx): No  >>Shift Summary: Patient observed to be calm, pleasant, and congruent on unit. Patient able to make needs known. Patient observed to engage appropriately with staff and peers, but with little initiation. Patient taking medications as prescribed. This shift, no PRN medication requested or required. No reported or observed side effects to medication. No reported or observed agitation, aggression, or other acute emotional distress. No reported or observed physical abnormalities or concerns.  Last Vitals  Vitals Weight: 80.7 kg Temp: (!) 97.3 F (36.3 C) Temp Source: Oral Pulse Rate: 82 Resp: 16 BP: 104/82 Patient Position: (not recorded)  Admission Type  Psych Admission Type (Psych Patients Only) Admission Status: Involuntary Date 72 hour document signed : (not recorded) Time 72 hour document signed : (not recorded) Provider Notified (First and Last Name) (see details for LINK to note): (not recorded)   Psychosocial Assessment  Psychosocial Assessment Patient Complaints: None Eye Contact: Fair Facial Expression: Fixed smile, Animated Affect: Preoccupied Speech: Soft Interaction: Childlike, Minimal Motor Activity: Shuffling Appearance/Hygiene: Disheveled Behavior Characteristics: Cooperative Mood: Preoccupied, Pleasant   Aggressive Behavior  Targets: (not recorded)   Thought Process  Thought Process Coherency: Disorganized Content: Preoccupation Delusions: None reported or observed Perception: Within Defined Limits Hallucination: None reported or observed Judgment:  Limited Confusion: None  Danger to Self/Others  Danger to Self Current suicidal ideation?: Denies Description of Suicide Plan: (not recorded) Self-Injurious Behavior: (not recorded) Agreement Not to Harm Self: (not recorded) Description of Agreement: (not recorded) Danger to Others: None reported or observed

## 2024-04-14 NOTE — Group Note (Signed)
 Occupational Therapy Group Note  Group Topic: Sleep Hygiene  Group Date: 04/14/2024 Start Time: 1530 End Time: 1600 Facilitators: Dot [place] MATSU, OT   Group Description: Group encouraged increased participation and engagement through topic focused on sleep hygiene. Patients reflected on the quality of sleep they typically receive and identified areas that need improvement. Group was given background information on sleep and sleep hygiene, including common sleep disorders. Group members also received information on how to improve one's sleep and introduced a sleep diary as a tool that can be utilized to track sleep quality over a length of time. Group session ended with patients identifying one or more strategies they could utilize or implement into their sleep routine in order to improve overall sleep quality.        Therapeutic Goal(s):  Identify one or more strategies to improve overall sleep hygiene  Identify one or more areas of sleep that are negatively impacted (sleep too much, too little, etc)     Participation Level: Engaged   Participation Quality: Minimal Cues   Behavior: Distracted   Speech/Thought Process: Loose association    Affect/Mood: Blunted   Insight: Lacking   Judgement: Lacking      Modes of Intervention: Education  Patient Response to Interventions:  Disengaged   Plan: Continue to engage patient in OT groups 2 - 3x/week.  04/14/2024  [place] MATSU Dot, OT  [place], OT

## 2024-04-14 NOTE — Group Note (Signed)
 Date:  04/14/2024 Time:  8:36 PM  Group Topic/Focus:  Wrap-Up Group:   The focus of this group is to help patients review their daily goal of treatment and discuss progress on daily workbooks.    Participation Level:  Did Not Attend   Allison Bolton 04/14/2024, 8:36 PM

## 2024-04-14 NOTE — Progress Notes (Signed)
 Allison Bolton   Type of Note: Meeting with [facility]  This Clinical research associate, pt, and Psychiatric nurse (SW with [facility]) briefly met in the conference room this morning. Pt returned to the unit with no issues or concerns.   Signed:  Lacoya Wilbanks, LCSW-A 04/14/2024  11:12 AM

## 2024-04-14 NOTE — Plan of Care (Signed)
   Problem: Education: Goal: Emotional status will improve Outcome: Progressing Goal: Mental status will improve Outcome: Progressing

## 2024-04-14 NOTE — Progress Notes (Signed)
(  Sleep Hours) - 8.75  (Any PRNs that were needed, meds refused, or side effects to meds)- atarax  25mg , no meds refused, no side effects to meds  (Any disturbances and when (visitation, over night)- n/a  (Concerns raised by the patient)- n/a  (SI/HI/AVH)- denies

## 2024-04-14 NOTE — Group Note (Signed)
 Recreation Therapy Group Note   Group Topic:Problem Solving  Group Date: 04/14/2024 Start Time: 1002 End Time: 1028 Facilitators: Nelli Swalley-McCall, LRT,CTRS Location: [address] Dayroom   Group Topic: Problem Solving   Goal Area(s) Addresses:  Patient will successfully try to guess the pictures being drawn.  Patient will acknowledge benefits healthy engagement with others post discharge.  Patient will actively work towards goal.   Behavioral Response: Minimal   Intervention: Competitive Group Game    Activity: Pictionary. In groups of 5-7, patients took turns trying to guess the picture being drawn on the board by their teammate.  If the team guessed the correct answer, they won a point.  If the team guessed wrong, the other team got a chance to steal the point. After several rounds of game play, the team with the most points were declared winners. Post-activity discussion reviewed benefits of positive recreation outlets: reducing stress, improving coping mechanisms, increasing self-esteem, and building larger support systems.   Education:  Teacher, English as a foreign language, Leisure as Merchant navy officer, Programmer, applications, Building control surveyor   Education Outcome: Acknowledges education/In group clarification offered/Needs additional education   Affect/Mood: Appropriate   Participation Level: Minimal   Participation Quality: Independent   Behavior: Cooperative   Speech/Thought Process: Relevant   Insight: Fair   Judgement: Fair    Modes of Intervention: Competitive Play   Patient Response to Interventions:  Receptive   Education Outcome:  In group clarification offered    Clinical Observations/Individualized Feedback: Pt was in and out of group a few times. While in group, pt attempted to compete in the activity. Pt was able to get one of the drawings correct but didn't understand how she was to execute her drawing. Instead of drawing the word out, pt wrote the word on the board.       Plan: Continue to engage patient in RT group sessions 2-3x/week.   Allison Bolton, LRT,CTRS 04/14/2024 11:33 AM

## 2024-04-14 NOTE — Progress Notes (Signed)
 [facility] MD Progress Note  04/14/2024 1:38 PM Allison Bolton  MRN:  [id number] Subjective:   Allison Bolton is a 53 yr old female who presented on 02/17/24 to [facility] ED with a chief complaint of anxiety. In the ED, she was found to be acutely psychotic with tangential speech, agitation, and delusional thought content. Patient was admitted involuntarily to [facility] on 8/14.  PPHx is significant for Substance-Induced Psychosis, Bipolar I Disorder, Stimulant Use Disorder, GAD, Suicide Attempt (2007), and Prior Psychiatric Hospitalizations (last- [facility] 01/2024).   Case was discussed in the multidisciplinary team. MAR was reviewed and patient was compliant with medications.  She received PRN Hydroxyzine  yesterday.   Psychiatric Team made the following recommendations yesterday: -Continue Haldol  10 mg q12 for psychosis and mood stability -Continue Geodon  60 mg BID for psychosis and mood stability -Continue Depakote  DR 750 mg q12 for mood stability -Continue Cogentin  1 mg BID for Drug Induced EPS    On interview today patient reports she slept good last night.  She reports her appetite is doing good.  She reports no SI, HI, or AVH.  She reports no Paranoia or Ideas of Reference.  She reports no issues with her medications.  She asks when she will be discharged and discussed with her that we were working on securing placement.  She reports no other concerns at present.   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 The direct patient care time included face-to-face time with the patient, reviewing the patient's chart, communicating with other  professionals, and coordinating care.    Past Psychiatric History:  Substance-Induced Psychosis, Bipolar I Disorder, Stimulant Use Disorder, GAD, Suicide Attempt (2007), and Prior Psychiatric Hospitalizations (last- [facility] 01/2024).  Past Medical History:  Past Medical History:  Diagnosis Date   Anxiety    Arthritis    Asthma    Bipolar 1 disorder (HCC)    Diverticulosis    Dysrhythmia    sts I have heart palpitations   Fibromyalgia    H/O hiatal hernia    4   Headache(784.0)    High blood pressure    Meniere's disease    S/P colonoscopy    Dr. Golda 2010: few small diverticula at sigmoid. otherwise normal.    Shortness of breath     Past Surgical History:  Procedure Laterality Date   ABDOMINAL HYSTERECTOMY     APPENDECTOMY     CESAREAN SECTION     CHOLECYSTECTOMY     COLONOSCOPY N/A 12/25/2023   Procedure: COLONOSCOPY;  Surgeon: Eartha Angelia Sieving, MD;  Location: [facility];  Service: Gastroenterology;  Laterality: N/A;  11:30am, asa 1   exploratory laparoscopy     FOOT SURGERY     HEMORRHOID SURGERY     LAPAROSCOPIC APPENDECTOMY  02/19/2011   Procedure: APPENDECTOMY LAPAROSCOPIC;  Surgeon: Oneil DELENA Budge;  Location: [facility];  Service: General;  Laterality: N/A;   LAPAROSCOPY  02/19/2011   Procedure: LAPAROSCOPY DIAGNOSTIC;  Surgeon: Oneil DELENA Budge;  Location: [facility];  Service: General;  Laterality: N/A;   left ovarian removal     multiple hernia repairs     Right ovarian removal     TONSILLECTOMY  TONSILLECTOMY AND ADENOIDECTOMY     tubes in ears     UMBILICAL HERNIA REPAIR  Dec 2011   Dr. Mavis   wisdom teeth removal     Family History:  Family History  Problem Relation Age of Onset   Thyroid  disease Mother    Colon cancer Father    Breast cancer Maternal Aunt    Colon cancer Brother    Colon cancer Other        paternal and maternal grandfather   Meniere's disease Other    Anesthesia problems Neg Hx    Hypotension Neg Hx    Malignant  hyperthermia Neg Hx    Pseudochol deficiency Neg Hx    Family Psychiatric  History:  None Reported   Social History:  Social History   Substance and Sexual Activity  Alcohol Use No   Comment: not since June 2012     Social History   Substance and Sexual Activity  Drug Use Yes   Types: Marijuana   Comment: patient denies any-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    Social History   Socioeconomic History   Marital status: Legally Separated    Spouse name: Not on file   Number of children: Not on file   Years of education: Not on file   Highest education level: Not on file  Occupational History   Not on file  Tobacco Use   Smoking status: Every Day    Current packs/day: 0.00    Average packs/day: 0.5 packs/day for 37.3 years (18.7 ttl pk-yrs)    Types: Cigarettes    Start date: 07/07/1981    Last attempt to quit: 11/05/2018    Years since quitting: 5.4   Smokeless tobacco: Never  Vaping Use   Vaping status: Never Used  Substance and Sexual Activity   Alcohol use: No    Comment: not since June 2012   Drug use: Yes    Types: Marijuana    Comment: patient denies any-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   Sexual activity: Never    Birth control/protection: Surgical  Other Topics Concern   Not on file  Social History Narrative   Not on file   Social Drivers of Health   Financial Resource Strain: Not on file  Food Insecurity: Food Insecurity Present (02/18/2024)   Hunger Vital Sign    Worried About Running Out of Food in the Last Year: Sometimes true    Ran Out of Food in the Last Year: Sometimes true  Transportation Needs: Unmet Transportation Needs (02/18/2024)   PRAPARE - Administrator, Civil Service (Medical): Yes    Lack of Transportation (Non-Medical): Yes  Physical Activity: Not on file  Stress: Not on file  Social Connections: Unknown (10/14/2021)   Received from [facility]   Social Connections    Do your friends and  family support you?: Not on file    What agencies support you?: Not on file   Additional Social History:                         Sleep: Good Estimated Sleeping Duration (Last 24 Hours): 8.00-8.50 hours  Appetite:  Good  Current Medications: Current Facility-Administered Medications  Medication Dose Route Frequency Provider Last Rate Last Admin   acetaminophen  (TYLENOL ) tablet 650 mg  650 mg Oral Q6H PRN White, Patrice L, NP   650 mg at 04/12/24 2139  alum & mag hydroxide-simeth (MAALOX/MYLANTA) 200-200-20 MG/5ML suspension 30 mL  30 mL Oral Q4H PRN White, Patrice L, NP   30 mL at 03/24/24 0217   benztropine  (COGENTIN ) tablet 1 mg  1 mg Oral BID White, Patrice L, NP   1 mg at 04/14/24 0842   divalproex  (DEPAKOTE ) DR tablet 750 mg  750 mg Oral Q12H Ji, Andrew, MD   750 mg at 04/14/24 9157   haloperidol  (HALDOL ) tablet 10 mg  10 mg Oral Q12H Ji, Andrew, MD   10 mg at 04/14/24 9157   hydrOXYzine  (ATARAX ) tablet 25 mg  25 mg Oral TID PRN White, Patrice L, NP   25 mg at 04/13/24 2029   magnesium  hydroxide (MILK OF MAGNESIA) suspension 30 mL  30 mL Oral Daily PRN White, Patrice L, NP       nicotine  polacrilex (NICORETTE ) gum 2 mg  2 mg Oral PRN Trudy Carwin, NP   2 mg at 03/28/24 1716   OLANZapine  (ZYPREXA ) injection 10 mg  10 mg Intramuscular TID PRN White, Patrice L, NP       OLANZapine  (ZYPREXA ) injection 5 mg  5 mg Intramuscular TID PRN White, Patrice L, NP       OLANZapine  zydis (ZYPREXA ) disintegrating tablet 5 mg  5 mg Oral TID PRN White, Patrice L, NP   5 mg at 04/06/24 2128   ondansetron  (ZOFRAN -ODT) disintegrating tablet 4 mg  4 mg Oral Q8H PRN Lynnette Barter, MD   4 mg at 04/02/24 1946   traZODone  (DESYREL ) tablet 50 mg  50 mg Oral QHS White, Patrice L, NP   50 mg at 04/13/24 2029   ziprasidone  (GEODON ) capsule 60 mg  60 mg Oral BID WC Marry Clamp, MD   60 mg at 04/14/24 9157    Lab Results:  No results found for this or any previous visit (from the past 48  hours).    Blood Alcohol level:  Lab Results  Component Value Date   [facility] <15 02/17/2024   ETH <15 01/14/2024    Metabolic Disorder Labs: Lab Results  Component Value Date   HGBA1C 5.1 01/18/2024   MPG 99.67 01/18/2024   MPG 105.41 05/11/2019   Lab Results  Component Value Date   PROLACTIN 24.1 (H) 05/11/2019   Lab Results  Component Value Date   CHOL 154 01/18/2024   TRIG 180 (H) 01/18/2024   HDL 49 01/18/2024   CHOLHDL 3.1 01/18/2024   VLDL 36 01/18/2024   LDLCALC 69 01/18/2024   LDLCALC 82 05/11/2019    Physical Findings: AIMS:  ,  ,  ,  ,  ,  ,   CIWA:    COWS:     Musculoskeletal: Strength & Muscle Tone: within normal limits Gait & Station: normal Patient leans: N/A  Psychiatric Specialty Exam:  Presentation  General Appearance:  Casual  Eye Contact: Fair  Speech: Normal Rate  Speech Volume: Normal  Handedness:No data recorded  Mood and Affect  Mood: Euthymic  Affect: Other (comment) (always smiling)   Thought Process  Thought Processes: Linear  Descriptions of Associations:Loose  Orientation:Partial  Thought Content:Scattered  History of Schizophrenia/Schizoaffective disorder:No  Duration of Psychotic Symptoms:Greater than six months  Hallucinations:Hallucinations: None   Ideas of Reference:None  Suicidal Thoughts:Suicidal Thoughts: No   Homicidal Thoughts:Homicidal Thoughts: No    Sensorium  Memory: Immediate Poor; Recent Poor  Judgment: Impaired  Insight: Lacking   Executive Functions  Concentration: Fair  Attention Span: Fair  Recall: Poor  Fund of Knowledge:  Poor  Language: Fair   Psychomotor Activity  Psychomotor Activity:Psychomotor Activity: Normal    Assets  Assets: Resilience   Sleep  Sleep:Sleep: Good     Physical Exam: Physical Exam Vitals and nursing note reviewed.  Constitutional:      General: She is not in acute distress.    Appearance: Normal appearance.  She is normal weight. She is not ill-appearing or toxic-appearing.  HENT:     Head: Normocephalic and atraumatic.  Pulmonary:     Effort: Pulmonary effort is normal.  Neurological:     Mental Status: She is alert.    Review of Systems  Respiratory:  Negative for cough and shortness of breath.   Cardiovascular:  Negative for chest pain.  Gastrointestinal:  Negative for abdominal pain, constipation, diarrhea, nausea and vomiting.  Neurological:  Negative for dizziness, weakness and headaches.  Psychiatric/Behavioral:  Negative for depression, hallucinations and suicidal ideas. The patient is not nervous/anxious.    Blood pressure 104/82, pulse 82, temperature (!) 97.3 F (36.3 C), temperature source Oral, resp. rate 16, height 5' 6 (1.676 m), weight 80.7 kg, SpO2 98%. Body mass index is 28.71 kg/m.   Treatment Plan Summary: Daily contact with patient to assess and evaluate symptoms and progress in treatment and Medication management  Allison Bolton is a 53 yr old female who presented on 02/17/24 to [facility] ED with a chief complaint of anxiety. In the ED, she was found to be acutely psychotic with tangential speech, agitation, and delusional thought content. Patient was admitted involuntarily to [facility] on 8/14.  PPHx is significant for Substance-Induced Psychosis, Bipolar I Disorder, Stimulant Use Disorder, GAD, Suicide Attempt (2007), and Prior Psychiatric Hospitalizations (last- [facility] 01/2024).   Allison Bolton remains stable.  We continue to wait for bed placement at a group home.  We will not make any changes to her medications at this time.  We will continue to monitor.   Substance-induced psychosis  Bipolar disorder: -Continue Haldol  10 mg q12 for psychosis and mood stability -Continue Geodon  60 mg BID for psychosis and mood stability -Continue Depakote  DR 750 mg q12 for mood stability -Continue Cogentin  1 mg BID for Drug Induced EPS -Continue Agitation Protocol:  Zyprexa    Dementia in other diseases classified elsewhere, unspecified severity, without behavioral disturbance, psychotic disturbance, mood disturbance, and anxiety: Allison Bolton was given a MOCA on 9/17 and scored an 8.  [facility] SW is seeking a guardian for the patient as she needs 24 hour assistance at this point and they are looking for placement at either SNF or group home.     Nicotine  Dependence: -Continue Nicotine  Gum 2 mg PRN   -Continue Zofran  4 mg q8 PRN nausea/vomiting -Continue PRN's: Tylenol , Maalox, Atarax , Milk of Magnesia, Trazodone    --  The risks/benefits/side-effects/alternatives to medications were discussed in detail with the patient and time was given for questions. The patient consents to medication trials.                -- Metabolic profile and EKG monitoring obtained while on an atypical antipsychotic (BMI: 28.73 Lipid Panel: WNL except Trig: 180 HbgA1c: 5.1)              -- Encouraged patient to participate in unit milieu and in scheduled group therapies              -- Short Term Goals: Ability to identify changes in lifestyle to reduce recurrence of condition will improve, Ability to verbalize feelings will improve, Ability to disclose and  discuss suicidal ideas, Ability to demonstrate self-control will improve, Ability to identify and develop effective coping behaviors will improve, Ability to maintain clinical measurements within normal limits will improve, Compliance with prescribed medications will improve, and Ability to identify triggers associated with substance abuse/mental health issues will improve             -- Long Term Goals: Improvement in symptoms so as ready for discharge   Safety and Monitoring:             -- Involuntary admission to inpatient psychiatric unit for safety, stabilization and treatment             -- Daily contact with patient to assess and evaluate symptoms and progress in treatment             -- Patient's case to be discussed in  multi-disciplinary team meeting             -- Observation Level : q15 minute checks             -- Vital signs:  q12 hours             -- Precautions: suicide, elopement, and assault  Discharge Planning:              -- Social work and case management to assist with discharge planning and identification of hospital follow-up needs prior to discharge             -- Estimated LOS: 7+ more days             -- Discharge Concerns: Need to establish a safety plan; Medication compliance and effectiveness             -- Discharge Goals: Return home with outpatient referrals for mental health follow-up including medication management/psychotherapy   Allison GORMAN Rosser, DO 04/14/2024, 1:38 PM

## 2024-04-15 ENCOUNTER — Encounter (HOSPITAL_COMMUNITY): Payer: Self-pay

## 2024-04-15 DIAGNOSIS — F0280 Dementia in other diseases classified elsewhere without behavioral disturbance: Secondary | ICD-10-CM | POA: Diagnosis not present

## 2024-04-15 DIAGNOSIS — F19959 Other psychoactive substance use, unspecified with psychoactive substance-induced psychotic disorder, unspecified: Secondary | ICD-10-CM | POA: Diagnosis not present

## 2024-04-15 NOTE — Group Note (Signed)
 Date:  04/15/2024 Time:  8:17 PM  Group Topic/Focus:  Wrap-Up Group:   The focus of this group is to help patients review their daily goal of treatment and discuss progress on daily workbooks.    Participation Level:  Did Not Attend  Bodhi Moradi Dacosta 04/15/2024, 8:17 PM

## 2024-04-15 NOTE — Plan of Care (Signed)
 Problem: Coping: Goal: Ability to verbalize frustrations and anger appropriately will improve Outcome: Progressing   Problem: Physical Regulation: Goal: Ability to maintain clinical measurements within normal limits will improve Outcome: Progressing   Problem: Safety: Goal: Periods of time without injury will increase Outcome: Progressing

## 2024-04-15 NOTE — Group Note (Signed)
 Recreation Therapy Group Note   Group Topic:General Recreation  Group Date: 04/15/2024 Start Time: 1015 End Time: 1035 Facilitators: Trimaine Maser-McCall, LRT,CTRS Location: [address] Dayroom   Group Topic/Focus: General Recreation   Goal Area(s) Addresses:  Patient will use appropriate interactions with peers.   Patient will work together to come up with answers.  Behavioral Response:   Intervention: Worksheet, Music  Activity: Dentist. Patients were given a worksheet (front and back) of brain teasers. Patients could work together to figure out each individual puzzle presented. While working, LRT played music. LRT and patients went over the answers when patients completed the assignment.     Affect/Mood: N/A   Participation Level: Did not attend    Clinical Observations/Individualized Feedback:     Plan: Continue to engage patient in RT group sessions 2-3x/week.   Allison Bolton, LRT,CTRS 04/15/2024 11:59 AM
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
## 2024-04-15 NOTE — [facility] Group Notes (Signed)
 Adult Psychoeducational Group Note  Date:  04/15/2024 Time:  10:07 AM  Group Topic/Focus:  Goals Group:   The focus of this group is to help patients establish daily goals to achieve during treatment and discuss how the patient can incorporate goal setting into their daily lives to aide in recovery.  Participation Level:  Did Not Attend  Participation Quality:  na  Affect:  na  Cognitive:  na  Insight: na  Engagement in Group:  na  Modes of Intervention:  na  Additional Comments:  Did not attend   Allison Bolton 04/15/2024, 10:07 AM

## 2024-04-15 NOTE — Progress Notes (Signed)
 Shift Note  (Sleep Hours) - 8.25  (Any PRNs that were needed, meds refused, or side effects to meds)- PO PRN Trazodone  and Hydroxyzine    (Any disturbances and when (visitation, over night)- None  (Concerns raised by the patient)- None  (SI/HI/AVH)- Denies

## 2024-04-15 NOTE — Progress Notes (Signed)
 [facility] MD Progress Note  04/15/2024 12:23 PM Allison Bolton  MRN:  [id number] Subjective:   Allison Bolton is a 53 yr old female who presented on 02/17/24 to [facility] ED with a chief complaint of anxiety. In the ED, she was found to be acutely psychotic with tangential speech, agitation, and delusional thought content. Patient was admitted involuntarily to [facility] on 8/14.  PPHx is significant for Substance-Induced Psychosis, Bipolar I Disorder, Stimulant Use Disorder, GAD, Suicide Attempt (2007), and Prior Psychiatric Hospitalizations (last- [facility] 01/2024).   Case was discussed in the multidisciplinary team. MAR was reviewed and patient was compliant with medications.  Yesterday she received PRN Hydroxyzine .   Psychiatric Team made the following recommendations yesterday: -Continue Haldol  10 mg q12 for psychosis and mood stability -Continue Geodon  60 mg BID for psychosis and mood stability -Continue Depakote  DR 750 mg q12 for mood stability -Continue Cogentin  1 mg BID for Drug Induced EPS   On interview today patient reports she slept good last night.  She reports her appetite is doing good.  She reports no SI, HI, or AVH.  She reports no Paranoia or Ideas of Reference.  She reports no issues with her medications.  She reports no other concerns at present.   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 The direct patient care time included face-to-face time with the patient, reviewing the patient's chart, communicating with other professionals, and coordinating care.    Past Psychiatric History:  Substance-Induced Psychosis, Bipolar I  Disorder, Stimulant Use Disorder, GAD, Suicide Attempt (2007), and Prior Psychiatric Hospitalizations (last- [facility] 01/2024).  Past Medical History:  Past Medical History:  Diagnosis Date   Anxiety    Arthritis    Asthma    Bipolar 1 disorder (HCC)    Diverticulosis    Dysrhythmia    sts I have heart palpitations   Fibromyalgia    H/O hiatal hernia    4   Headache(784.0)    High blood pressure    Meniere's disease    S/P colonoscopy    Dr. Golda 2010: few small diverticula at sigmoid. otherwise normal.    Shortness of breath     Past Surgical History:  Procedure Laterality Date   ABDOMINAL HYSTERECTOMY     APPENDECTOMY     CESAREAN SECTION     CHOLECYSTECTOMY     COLONOSCOPY N/A 12/25/2023   Procedure: COLONOSCOPY;  Surgeon: Eartha Angelia Sieving, MD;  Location: [facility];  Service: Gastroenterology;  Laterality: N/A;  11:30am, asa 1   exploratory laparoscopy     FOOT SURGERY     HEMORRHOID SURGERY     LAPAROSCOPIC APPENDECTOMY  02/19/2011   Procedure: APPENDECTOMY LAPAROSCOPIC;  Surgeon: Oneil DELENA Budge;  Location: [facility] ORS;  Service: General;  Laterality: N/A;   LAPAROSCOPY  02/19/2011   Procedure: LAPAROSCOPY DIAGNOSTIC;  Surgeon: Oneil DELENA Budge;  Location: [facility] ORS;  Service: General;  Laterality: N/A;   left ovarian removal     multiple hernia repairs     Right ovarian removal     TONSILLECTOMY     TONSILLECTOMY AND ADENOIDECTOMY     tubes in ears     UMBILICAL HERNIA REPAIR  Dec 2011  Dr. Mavis   wisdom teeth removal     Family History:  Family History  Problem Relation Age of Onset   Thyroid  disease Mother    Colon cancer Father    Breast cancer Maternal Aunt    Colon cancer Brother    Colon cancer Other        paternal and maternal grandfather   Meniere's disease Other    Anesthesia problems Neg Hx    Hypotension Neg Hx    Malignant hyperthermia Neg Hx    Pseudochol deficiency Neg Hx    Family Psychiatric  History:  None Reported   Social  History:  Social History   Substance and Sexual Activity  Alcohol Use No   Comment: not since June 2012     Social History   Substance and Sexual Activity  Drug Use Yes   Types: Marijuana   Comment: patient denies any-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    Social History   Socioeconomic History   Marital status: Legally Separated    Spouse name: Not on file   Number of children: Not on file   Years of education: Not on file   Highest education level: Not on file  Occupational History   Not on file  Tobacco Use   Smoking status: Every Day    Current packs/day: 0.00    Average packs/day: 0.5 packs/day for 37.3 years (18.7 ttl pk-yrs)    Types: Cigarettes    Start date: 07/07/1981    Last attempt to quit: 11/05/2018    Years since quitting: 5.4   Smokeless tobacco: Never  Vaping Use   Vaping status: Never Used  Substance and Sexual Activity   Alcohol use: No    Comment: not since June 2012   Drug use: Yes    Types: Marijuana    Comment: patient denies any-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   Sexual activity: Never    Birth control/protection: Surgical  Other Topics Concern   Not on file  Social History Narrative   Not on file   Social Drivers of Health   Financial Resource Strain: Not on file  Food Insecurity: Food Insecurity Present (02/18/2024)   Hunger Vital Sign    Worried About Running Out of Food in the Last Year: Sometimes true    Ran Out of Food in the Last Year: Sometimes true  Transportation Needs: Unmet Transportation Needs (02/18/2024)   PRAPARE - Administrator, Civil Service (Medical): Yes    Lack of Transportation (Non-Medical): Yes  Physical Activity: Not on file  Stress: Not on file  Social Connections: Unknown (10/14/2021)   Received from [facility]   Social Connections    Do your friends and family support you?: Not on file    What agencies support you?: Not on file   Additional Social History:                          Sleep: Good Estimated Sleeping Duration (Last 24 Hours): 7.75-9.50 hours  Appetite:  Good  Current Medications: Current Facility-Administered Medications  Medication Dose Route Frequency Provider Last Rate Last Admin   acetaminophen  (TYLENOL ) tablet 650 mg  650 mg Oral Q6H PRN White, Patrice L, NP   650 mg at 04/12/24 2139   alum & mag hydroxide-simeth (MAALOX/MYLANTA) 200-200-20 MG/5ML suspension 30 mL  30 mL Oral Q4H PRN White, Wyline CROME, NP  30 mL at 03/24/24 0217   benztropine  (COGENTIN ) tablet 1 mg  1 mg Oral BID White, Patrice L, NP   1 mg at 04/15/24 9187   divalproex  (DEPAKOTE ) DR tablet 750 mg  750 mg Oral Q12H Ji, Andrew, MD   750 mg at 04/15/24 9187   haloperidol  (HALDOL ) tablet 10 mg  10 mg Oral Q12H Ji, Andrew, MD   10 mg at 04/15/24 9187   hydrOXYzine  (ATARAX ) tablet 25 mg  25 mg Oral TID PRN White, Patrice L, NP   25 mg at 04/14/24 2122   magnesium  hydroxide (MILK OF MAGNESIA) suspension 30 mL  30 mL Oral Daily PRN White, Patrice L, NP       nicotine  polacrilex (NICORETTE ) gum 2 mg  2 mg Oral PRN Trudy Carwin, NP   2 mg at 03/28/24 1716   OLANZapine  (ZYPREXA ) injection 10 mg  10 mg Intramuscular TID PRN White, Patrice L, NP       OLANZapine  (ZYPREXA ) injection 5 mg  5 mg Intramuscular TID PRN White, Patrice L, NP       OLANZapine  zydis (ZYPREXA ) disintegrating tablet 5 mg  5 mg Oral TID PRN White, Patrice L, NP   5 mg at 04/06/24 2128   ondansetron  (ZOFRAN -ODT) disintegrating tablet 4 mg  4 mg Oral Q8H PRN Allison Barter, MD   4 mg at 04/02/24 1946   traZODone  (DESYREL ) tablet 50 mg  50 mg Oral QHS White, Patrice L, NP   50 mg at 04/14/24 2122   ziprasidone  (GEODON ) capsule 60 mg  60 mg Oral BID WC Marry Clamp, MD   60 mg at 04/15/24 9187    Lab Results:  No results found for this or any previous visit (from the past 48 hours).    Blood Alcohol level:  Lab Results  Component Value Date   [facility] <15 02/17/2024   ETH <15 01/14/2024     Metabolic Disorder Labs: Lab Results  Component Value Date   HGBA1C 5.1 01/18/2024   MPG 99.67 01/18/2024   MPG 105.41 05/11/2019   Lab Results  Component Value Date   PROLACTIN 24.1 (H) 05/11/2019   Lab Results  Component Value Date   CHOL 154 01/18/2024   TRIG 180 (H) 01/18/2024   HDL 49 01/18/2024   CHOLHDL 3.1 01/18/2024   VLDL 36 01/18/2024   LDLCALC 69 01/18/2024   LDLCALC 82 05/11/2019    Physical Findings: AIMS:  ,  ,  ,  ,  ,  ,   CIWA:    COWS:     Musculoskeletal: Strength & Muscle Tone: within normal limits Gait & Station: normal Patient leans: N/A  Psychiatric Specialty Exam:  Presentation  General Appearance:  Casual  Eye Contact: Fair  Speech: Normal Rate; Clear and Coherent  Speech Volume: Normal  Handedness:No data recorded  Mood and Affect  Mood: Euthymic  Affect: Other (comment) (always smiling)   Thought Process  Thought Processes: Linear  Descriptions of Associations:Loose  Orientation:Partial  Thought Content:Scattered  History of Schizophrenia/Schizoaffective disorder:No  Duration of Psychotic Symptoms:Greater than six months  Hallucinations:Hallucinations: None   Ideas of Reference:None  Suicidal Thoughts:Suicidal Thoughts: No   Homicidal Thoughts:Homicidal Thoughts: No    Sensorium  Memory: Immediate Poor; Recent Poor  Judgment: Impaired  Insight: Lacking   Executive Functions  Concentration: Fair  Attention Span: Fair  Recall: Poor  Fund of Knowledge: Poor  Language: Fair   Psychomotor Activity  Psychomotor Activity:Psychomotor Activity: Normal    Assets  Assets:  Resilience   Sleep  Sleep:Sleep: Good     Physical Exam: Physical Exam Vitals and nursing note reviewed.  Constitutional:      General: She is not in acute distress.    Appearance: Normal appearance. She is normal weight. She is not toxic-appearing.  HENT:     Head: Normocephalic and  atraumatic.  Pulmonary:     Effort: Pulmonary effort is normal.  Musculoskeletal:        General: Normal range of motion.  Neurological:     General: No focal deficit present.     Mental Status: She is alert.    Review of Systems  Respiratory:  Negative for cough and shortness of breath.   Cardiovascular:  Negative for chest pain.  Gastrointestinal:  Negative for abdominal pain, constipation, diarrhea, nausea and vomiting.  Neurological:  Negative for dizziness, weakness and headaches.  Psychiatric/Behavioral:  Negative for depression, hallucinations and suicidal ideas. The patient is not nervous/anxious.    Blood pressure 122/81, pulse 77, temperature (!) 97.5 F (36.4 C), temperature source Oral, resp. rate 16, height 5' 6 (1.676 m), weight 80.7 kg, SpO2 98%. Body mass index is 28.71 kg/m.   Treatment Plan Summary: Daily contact with patient to assess and evaluate symptoms and progress in treatment and Medication management  Allison Bolton is a 53 yr old female who presented on 02/17/24 to [facility] ED with a chief complaint of anxiety. In the ED, she was found to be acutely psychotic with tangential speech, agitation, and delusional thought content. Patient was admitted involuntarily to [facility] on 8/14.  PPHx is significant for Substance-Induced Psychosis, Bipolar I Disorder, Stimulant Use Disorder, GAD, Suicide Attempt (2007), and Prior Psychiatric Hospitalizations (last- [facility] 01/2024).   Allison Bolton continues to be stable.  We will not make any changes to her medications at this time.  We continue to wait for bed placement at a group home.  We will continue to monitor.    Substance-induced psychosis  Bipolar disorder: -Continue Haldol  10 mg q12 for psychosis and mood stability -Continue Geodon  60 mg BID for psychosis and mood stability -Continue Depakote  DR 750 mg q12 for mood stability -Continue Cogentin  1 mg BID for Drug Induced EPS -Continue Agitation Protocol:  Zyprexa    Dementia in other diseases classified elsewhere, unspecified severity, without behavioral disturbance, psychotic disturbance, mood disturbance, and anxiety: Allison Bolton was given a MOCA on 9/17 and scored an 8.  [facility] SW is seeking a guardian for the patient as she needs 24 hour assistance at this point and they are looking for placement at either SNF or group home.     Nicotine  Dependence: -Continue Nicotine  Gum 2 mg PRN   -Continue Zofran  4 mg q8 PRN nausea/vomiting -Continue PRN's: Tylenol , Maalox, Atarax , Milk of Magnesia, Trazodone    --  The risks/benefits/side-effects/alternatives to medications were discussed in detail with the patient and time was given for questions. The patient consents to medication trials.                -- Metabolic profile and EKG monitoring obtained while on an atypical antipsychotic (BMI: 28.73 Lipid Panel: WNL except Trig: 180 HbgA1c: 5.1)              -- Encouraged patient to participate in unit milieu and in scheduled group therapies              -- Short Term Goals: Ability to identify changes in lifestyle to reduce recurrence of condition will improve, Ability to verbalize feelings will  improve, Ability to disclose and discuss suicidal ideas, Ability to demonstrate self-control will improve, Ability to identify and develop effective coping behaviors will improve, Ability to maintain clinical measurements within normal limits will improve, Compliance with prescribed medications will improve, and Ability to identify triggers associated with substance abuse/mental health issues will improve             -- Long Term Goals: Improvement in symptoms so as ready for discharge   Safety and Monitoring:             -- Involuntary admission to inpatient psychiatric unit for safety, stabilization and treatment             -- Daily contact with patient to assess and evaluate symptoms and progress in treatment             -- Patient's case to be discussed in  multi-disciplinary team meeting             -- Observation Level : q15 minute checks             -- Vital signs:  q12 hours             -- Precautions: suicide, elopement, and assault  Discharge Planning:              -- Social work and case management to assist with discharge planning and identification of hospital follow-up needs prior to discharge             -- Estimated LOS: 7+ more days             -- Discharge Concerns: Need to establish a safety plan; Medication compliance and effectiveness             -- Discharge Goals: Return home with outpatient referrals for mental health follow-up including medication management/psychotherapy   Marsa GORMAN Rosser, DO 04/15/2024, 12:23 PM

## 2024-04-16 DIAGNOSIS — F0280 Dementia in other diseases classified elsewhere without behavioral disturbance: Secondary | ICD-10-CM | POA: Diagnosis not present

## 2024-04-16 DIAGNOSIS — F19959 Other psychoactive substance use, unspecified with psychoactive substance-induced psychotic disorder, unspecified: Secondary | ICD-10-CM | POA: Diagnosis not present

## 2024-04-16 LAB — URINALYSIS, W/ REFLEX TO CULTURE (INFECTION SUSPECTED)
Bilirubin Urine: NEGATIVE
Glucose, UA: NEGATIVE mg/dL
Ketones, ur: NEGATIVE mg/dL
Nitrite: POSITIVE — AB
Protein, ur: 30 mg/dL — AB
Specific Gravity, Urine: 1.014 (ref 1.005–1.030)
WBC, UA: >50 WBC/hpf (ref 0–5)
pH: 6 (ref 5.0–8.0)

## 2024-04-16 MED ORDER — SULFAMETHOXAZOLE-TRIMETHOPRIM 800-160 MG PO TABS
1.0000 | ORAL_TABLET | Freq: Two times a day (BID) | ORAL | Status: DC
  Administered 2024-04-16 – 2024-04-18 (×5): 1 via ORAL
  Filled 2024-04-16 (×5): qty 1

## 2024-04-16 NOTE — Progress Notes (Signed)
 Tour of Duty:  Ronnald CHRISTELLA Boyer, RN, 04/16/24, Tour of Duty: 0700-1900  SI/HI/AVH: Denies  Self-Reported   Mood: Positive  Anxiety: Denies Depression: Denies Irritability: Denies  Broset  Violence Prevention Guidelines *See Row Information*: (not recorded)   LBM  Last BM Date : (not recorded)   Pain: not present  Patient Refusals (including Rx): No  >>Shift Summary:   Last Vitals  Vitals Weight: 80.7 kg Temp: 97.8 F (36.6 C) Temp Source: Oral Pulse Rate: (!) 107 Resp: 16 BP: 108/71 Patient Position: (not recorded)  Admission Type  Psych Admission Type (Psych Patients Only) Admission Status: Involuntary Date 72 hour document signed : (not recorded) Time 72 hour document signed : (not recorded) Provider Notified (First and Last Name) (see details for LINK to note): (not recorded)   Psychosocial Assessment  Psychosocial Assessment Patient Complaints: None Eye Contact: Fair Facial Expression: Flat Affect: Inconsistent with thought content Speech: Soft Interaction: Minimal Motor Activity: Slow Appearance/Hygiene: Poor hygiene, Disheveled Behavior Characteristics: Unwilling to participate, Calm Mood: Preoccupied   Aggressive Behavior  Targets: (not recorded)   Thought Process  Thought Process Coherency: Disorganized Content: Preoccupation Delusions: None reported or observed Perception: Within Defined Limits Hallucination: None reported or observed Judgment: Poor Confusion: Moderate  Danger to Self/Others  Danger to Self Current suicidal ideation?: Denies Description of Suicide Plan: (not recorded) Self-Injurious Behavior: (not recorded) Agreement Not to Harm Self: Yes Description of Agreement: Verbal Danger to Others: None reported or observed

## 2024-04-16 NOTE — Plan of Care (Signed)
   Problem: Education: Goal: Knowledge of [facility] Education information/materials will improve Outcome: Progressing Goal: Emotional status will improve Outcome: Progressing Goal: Mental status will improve Outcome: Progressing   Problem: Activity: Goal: Interest or engagement in activities will improve Outcome: Progressing

## 2024-04-16 NOTE — Plan of Care (Signed)
   Problem: Coping: Goal: Ability to verbalize frustrations and anger appropriately will improve Outcome: Progressing   Problem: Safety: Goal: Periods of time without injury will increase Outcome: Progressing

## 2024-04-16 NOTE — [facility] Group Notes (Signed)
 Adult Psychoeducational Group Note  Date:  04/16/2024 Time:  8:29 PM  Group Topic/Focus:  Wrap-Up Group:   The focus of this group is to help patients review their daily goal of treatment and discuss progress on daily workbooks.  Participation Level:  Did Not Attend  Allison Bolton 04/16/2024, 8:29 PM

## 2024-04-16 NOTE — Plan of Care (Signed)
   Problem: Education: Goal: Knowledge of [facility] Education information/materials will improve Outcome: Progressing Goal: Emotional status will improve Outcome: Progressing Goal: Mental status will improve Outcome: Progressing Goal: Verbalization of understanding the information provided will improve Outcome: Progressing

## 2024-04-16 NOTE — Progress Notes (Signed)
 [facility] MD Progress Note  04/16/2024 12:28 PM VENA BASSINGER  MRN:  [id number] Subjective:   Allison Bolton is a 53 yr old female who presented on 02/17/24 to [facility] ED with a chief complaint of anxiety. In the ED, she was found to be acutely psychotic with tangential speech, agitation, and delusional thought content. Patient was admitted involuntarily to [facility] on 8/14.  PPHx is significant for Substance-Induced Psychosis, Bipolar I Disorder, Stimulant Use Disorder, GAD, Suicide Attempt (2007), and Prior Psychiatric Hospitalizations (last- [facility] 01/2024).   Case was discussed in the multidisciplinary team. MAR was reviewed and patient was compliant with medications.  She received PRN Tylenol  and Hydroxyzine  yesterday.   Psychiatric Team made the following recommendations yesterday: -Continue Haldol  10 mg q12 for psychosis and mood stability -Continue Geodon  60 mg BID for psychosis and mood stability -Continue Depakote  DR 750 mg q12 for mood stability -Continue Cogentin  1 mg BID for Drug Induced EPS   On interview today patient reports she slept good last night.  She reports her appetite is doing good.  She reports no SI, HI, or AVH.  She reports no Paranoia or Ideas of Reference.  She reports no issues with her medications.  She reports that she has had pain with urination recently.  She also reports increase in frequency.  Discussed with her that we would start an antibiotic and she was agreeable.  She reports no other concerns at present.    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 The direct patient care time included face-to-face  time with the patient, reviewing the patient's chart, communicating with other professionals, and coordinating care.    Past Psychiatric History:  Substance-Induced Psychosis, Bipolar I Disorder, Stimulant Use Disorder, GAD, Suicide Attempt (2007), and Prior Psychiatric Hospitalizations (last- [facility] 01/2024).  Past Medical History:  Past Medical History:  Diagnosis Date   Anxiety    Arthritis    Asthma    Bipolar 1 disorder (HCC)    Diverticulosis    Dysrhythmia    sts I have heart palpitations   Fibromyalgia    H/O hiatal hernia    4   Headache(784.0)    High blood pressure    Meniere's disease    S/P colonoscopy    Dr. Golda 2010: few small diverticula at sigmoid. otherwise normal.    Shortness of breath     Past Surgical History:  Procedure Laterality Date   ABDOMINAL HYSTERECTOMY     APPENDECTOMY     CESAREAN SECTION     CHOLECYSTECTOMY     COLONOSCOPY N/A 12/25/2023   Procedure: COLONOSCOPY;  Surgeon: Eartha Angelia Sieving, MD;  Location: [facility] ENDO [facility];  Service: Gastroenterology;  Laterality: N/A;  11:30am, asa 1   exploratory laparoscopy     FOOT SURGERY     HEMORRHOID SURGERY     LAPAROSCOPIC APPENDECTOMY  02/19/2011   Procedure: APPENDECTOMY LAPAROSCOPIC;  Surgeon: Oneil DELENA Budge;  Location: [facility] ORS;  Service: General;  Laterality: N/A;   LAPAROSCOPY  02/19/2011   Procedure: LAPAROSCOPY DIAGNOSTIC;  Surgeon: Oneil DELENA Budge;  Location: [facility] ORS;  Service: General;  Laterality: N/A;   left ovarian removal     multiple hernia repairs  Right ovarian removal     TONSILLECTOMY     TONSILLECTOMY AND ADENOIDECTOMY     tubes in ears     UMBILICAL HERNIA REPAIR  Dec 2011   Dr. Mavis   wisdom teeth removal     Family History:  Family History  Problem Relation Age of Onset   Thyroid  disease Mother    Colon cancer Father    Breast cancer Maternal Aunt    Colon cancer Brother    Colon cancer Other        paternal and maternal grandfather   Meniere's disease  Other    Anesthesia problems Neg Hx    Hypotension Neg Hx    Malignant hyperthermia Neg Hx    Pseudochol deficiency Neg Hx    Family Psychiatric  History:  None Reported   Social History:  Social History   Substance and Sexual Activity  Alcohol Use No   Comment: not since June 2012     Social History   Substance and Sexual Activity  Drug Use Yes   Types: Marijuana   Comment: patient denies any-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    Social History   Socioeconomic History   Marital status: Legally Separated    Spouse name: Not on file   Number of children: Not on file   Years of education: Not on file   Highest education level: Not on file  Occupational History   Not on file  Tobacco Use   Smoking status: Every Day    Current packs/day: 0.00    Average packs/day: 0.5 packs/day for 37.3 years (18.7 ttl pk-yrs)    Types: Cigarettes    Start date: 07/07/1981    Last attempt to quit: 11/05/2018    Years since quitting: 5.4   Smokeless tobacco: Never  Vaping Use   Vaping status: Never Used  Substance and Sexual Activity   Alcohol use: No    Comment: not since June 2012   Drug use: Yes    Types: Marijuana    Comment: patient denies any-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   Sexual activity: Never    Birth control/protection: Surgical  Other Topics Concern   Not on file  Social History Narrative   Not on file   Social Drivers of Health   Financial Resource Strain: Not on file  Food Insecurity: Food Insecurity Present (02/18/2024)   Hunger Vital Sign    Worried About Running Out of Food in the Last Year: Sometimes true    Ran Out of Food in the Last Year: Sometimes true  Transportation Needs: Unmet Transportation Needs (02/18/2024)   PRAPARE - Administrator, Civil Service (Medical): Yes    Lack of Transportation (Non-Medical): Yes  Physical Activity: Not on file  Stress: Not on file  Social Connections: Unknown (10/14/2021)   Received from  [facility]   Social Connections    Do your friends and family support you?: Not on file    What agencies support you?: Not on file   Additional Social History:                         Sleep: Good Estimated Sleeping Duration (Last 24 Hours): 6.25-7.75 hours  Appetite:  Good  Current Medications: Current Facility-Administered Medications  Medication Dose Route Frequency Provider Last Rate Last Admin   acetaminophen  (TYLENOL ) tablet 650 mg  650 mg Oral Q6H PRN White,  Patrice L, NP   650 mg at 04/15/24 2040   alum & mag hydroxide-simeth (MAALOX/MYLANTA) 200-200-20 MG/5ML suspension 30 mL  30 mL Oral Q4H PRN White, Patrice L, NP   30 mL at 03/24/24 0217   benztropine  (COGENTIN ) tablet 1 mg  1 mg Oral BID White, Patrice L, NP   1 mg at 04/16/24 0842   divalproex  (DEPAKOTE ) DR tablet 750 mg  750 mg Oral Q12H Ji, Andrew, MD   750 mg at 04/16/24 9157   haloperidol  (HALDOL ) tablet 10 mg  10 mg Oral Q12H Ji, Andrew, MD   10 mg at 04/16/24 9157   hydrOXYzine  (ATARAX ) tablet 25 mg  25 mg Oral TID PRN White, Patrice L, NP   25 mg at 04/15/24 2040   magnesium  hydroxide (MILK OF MAGNESIA) suspension 30 mL  30 mL Oral Daily PRN White, Patrice L, NP       nicotine  polacrilex (NICORETTE ) gum 2 mg  2 mg Oral PRN Trudy Carwin, NP   2 mg at 03/28/24 1716   OLANZapine  (ZYPREXA ) injection 10 mg  10 mg Intramuscular TID PRN White, Patrice L, NP       OLANZapine  (ZYPREXA ) injection 5 mg  5 mg Intramuscular TID PRN White, Patrice L, NP       OLANZapine  zydis (ZYPREXA ) disintegrating tablet 5 mg  5 mg Oral TID PRN White, Patrice L, NP   5 mg at 04/06/24 2128   ondansetron  (ZOFRAN -ODT) disintegrating tablet 4 mg  4 mg Oral Q8H PRN Lynnette Barter, MD   4 mg at 04/02/24 1946   traZODone  (DESYREL ) tablet 50 mg  50 mg Oral QHS White, Patrice L, NP   50 mg at 04/15/24 2040   ziprasidone  (GEODON ) capsule 60 mg  60 mg Oral BID WC Marry Clamp, MD   60 mg at 04/16/24 9157    Lab Results:  No  results found for this or any previous visit (from the past 48 hours).    Blood Alcohol level:  Lab Results  Component Value Date   [facility] <15 02/17/2024   ETH <15 01/14/2024    Metabolic Disorder Labs: Lab Results  Component Value Date   HGBA1C 5.1 01/18/2024   MPG 99.67 01/18/2024   MPG 105.41 05/11/2019   Lab Results  Component Value Date   PROLACTIN 24.1 (H) 05/11/2019   Lab Results  Component Value Date   CHOL 154 01/18/2024   TRIG 180 (H) 01/18/2024   HDL 49 01/18/2024   CHOLHDL 3.1 01/18/2024   VLDL 36 01/18/2024   LDLCALC 69 01/18/2024   LDLCALC 82 05/11/2019    Physical Findings: AIMS:  ,  ,  ,  ,  ,  ,   CIWA:    COWS:     Musculoskeletal: Strength & Muscle Tone: within normal limits Gait & Station: normal Patient leans: N/A  Psychiatric Specialty Exam:  Presentation  General Appearance:  Casual  Eye Contact: Fair  Speech: Clear and Coherent; Normal Rate  Speech Volume: Normal  Handedness:No data recorded  Mood and Affect  Mood: Euthymic  Affect: Other (comment) (smiling)   Thought Process  Thought Processes: Linear  Descriptions of Associations:Loose  Orientation:Partial  Thought Content:Scattered  History of Schizophrenia/Schizoaffective disorder:No  Duration of Psychotic Symptoms:Greater than six months  Hallucinations:Hallucinations: None   Ideas of Reference:None  Suicidal Thoughts:Suicidal Thoughts: No   Homicidal Thoughts:Homicidal Thoughts: No    Sensorium  Memory: Immediate Poor; Recent Poor  Judgment: Impaired  Insight: Lacking   Art therapist  Concentration: Fair  Attention Span: Fair  Recall: Poor  Fund of Knowledge: Poor  Language: Fair   Lexicographer Activity:Psychomotor Activity: Normal    Assets  Assets: Resilience   Sleep  Sleep:Sleep: Good     Physical Exam: Physical Exam Vitals and nursing note reviewed.  Constitutional:       General: She is not in acute distress.    Appearance: She is normal weight. She is not toxic-appearing.  HENT:     Head: Normocephalic and atraumatic.  Pulmonary:     Effort: Pulmonary effort is normal.  Neurological:     General: No focal deficit present.    Review of Systems  Respiratory:  Negative for cough and shortness of breath.   Cardiovascular:  Negative for chest pain.  Gastrointestinal:  Negative for abdominal pain, constipation, diarrhea, nausea and vomiting.  Neurological:  Negative for dizziness, weakness and headaches.  Psychiatric/Behavioral:  Negative for depression, hallucinations and suicidal ideas. The patient is not nervous/anxious.    Blood pressure 108/71, pulse (!) 107, temperature 97.8 F (36.6 C), temperature source Oral, resp. rate 16, height 5' 6 (1.676 m), weight 80.7 kg, SpO2 94%. Body mass index is 28.71 kg/m.   Treatment Plan Summary: Daily contact with patient to assess and evaluate symptoms and progress in treatment and Medication management  Allison Bolton is a 53 yr old female who presented on 02/17/24 to [facility] ED with a chief complaint of anxiety. In the ED, she was found to be acutely psychotic with tangential speech, agitation, and delusional thought content. Patient was admitted involuntarily to [facility] on 8/14.  PPHx is significant for Substance-Induced Psychosis, Bipolar I Disorder, Stimulant Use Disorder, GAD, Suicide Attempt (2007), and Prior Psychiatric Hospitalizations (last- [facility] 01/2024).   Richetta is reporting urinary frequency and burning so we will obtain a UA and then start Bactrim  DS.  We will not make any other changes to her medications at this time.  We will continue to await bed placement at a group home.   Substance-induced psychosis  Bipolar disorder: -Continue Haldol  10 mg q12 for psychosis and mood stability -Continue Geodon  60 mg BID for psychosis and mood stability -Continue Depakote  DR 750 mg q12 for mood  stability -Continue Cogentin  1 mg BID for Drug Induced EPS -Continue Agitation Protocol: Zyprexa    Dementia in other diseases classified elsewhere, unspecified severity, without behavioral disturbance, psychotic disturbance, mood disturbance, and anxiety: Sascha was given a MOCA on 9/17 and scored an 8.  [facility] SW is seeking a guardian for the patient as she needs 24 hour assistance at this point and they are looking for placement at either SNF or group home.     Nicotine  Dependence: -Continue Nicotine  Gum 2 mg PRN   Suspected UTI: -Obtain UA -Start Bactrim  DS BID for 10 days (Day 1 of 10)   -Continue Zofran  4 mg q8 PRN nausea/vomiting -Continue PRN's: Tylenol , Maalox, Atarax , Milk of Magnesia, Trazodone    --  The risks/benefits/side-effects/alternatives to medications were discussed in detail with the patient and time was given for questions. The patient consents to medication trials.                -- Metabolic profile and EKG monitoring obtained while on an atypical antipsychotic (BMI: 28.73 Lipid Panel: WNL except Trig: 180 HbgA1c: 5.1)              -- Encouraged patient to participate in unit milieu and in scheduled group therapies              --  Short Term Goals: Ability to identify changes in lifestyle to reduce recurrence of condition will improve, Ability to verbalize feelings will improve, Ability to disclose and discuss suicidal ideas, Ability to demonstrate self-control will improve, Ability to identify and develop effective coping behaviors will improve, Ability to maintain clinical measurements within normal limits will improve, Compliance with prescribed medications will improve, and Ability to identify triggers associated with substance abuse/mental health issues will improve             -- Long Term Goals: Improvement in symptoms so as ready for discharge   Safety and Monitoring:             -- Involuntary admission to inpatient psychiatric unit for safety,  stabilization and treatment             -- Daily contact with patient to assess and evaluate symptoms and progress in treatment             -- Patient's case to be discussed in multi-disciplinary team meeting             -- Observation Level : q15 minute checks             -- Vital signs:  q12 hours             -- Precautions: suicide, elopement, and assault  Discharge Planning:              -- Social work and case management to assist with discharge planning and identification of hospital follow-up needs prior to discharge             -- Estimated LOS: 7+ more days             -- Discharge Concerns: Need to establish a safety plan; Medication compliance and effectiveness             -- Discharge Goals: Return home with outpatient referrals for mental health follow-up including medication management/psychotherapy   Marsa GORMAN Rosser, DO 04/16/2024, 12:28 PM

## 2024-04-16 NOTE — BH Assessment (Signed)
(  Sleep Hours) - 9.75 (Any PRNs that were needed, meds refused, or side effects to meds)-  (Any disturbances and when (visitation, over night)- None (Concerns raised by the patient)- None (SI/HI/AVH)- None

## 2024-04-16 NOTE — Plan of Care (Signed)
  Problem: Education: Goal: Knowledge of [facility] Education information/materials will improve 04/16/2024 0720 by Claudene Bronwyn DASEN, RN Outcome: Progressing 04/16/2024 0716 by Claudene Bronwyn DASEN, RN Outcome: Progressing Goal: Emotional status will improve 04/16/2024 0720 by Claudene Bronwyn DASEN, RN Outcome: Progressing 04/16/2024 0716 by Claudene Bronwyn DASEN, RN Outcome: Progressing Goal: Mental status will improve 04/16/2024 0720 by Claudene Bronwyn DASEN, RN Outcome: Progressing 04/16/2024 0716 by Claudene Bronwyn DASEN, RN Outcome: Progressing Goal: Verbalization of understanding the information provided will improve 04/16/2024 0720 by Claudene Bronwyn DASEN, RN Outcome: Progressing 04/16/2024 0716 by Claudene Bronwyn DASEN, RN Outcome: Progressing

## 2024-04-16 NOTE — Plan of Care (Signed)
  Problem: Education: Goal: Knowledge of [facility] Education information/materials will improve 04/16/2024 0724 by Claudene Bronwyn DASEN, RN Outcome: Progressing 04/16/2024 0720 by Claudene Bronwyn DASEN, RN Outcome: Progressing 04/16/2024 0716 by Claudene Bronwyn DASEN, RN Outcome: Progressing Goal: Emotional status will improve 04/16/2024 0724 by Claudene Bronwyn DASEN, RN Outcome: Progressing 04/16/2024 0720 by Claudene Bronwyn DASEN, RN Outcome: Progressing 04/16/2024 0716 by Claudene Bronwyn DASEN, RN Outcome: Progressing Goal: Mental status will improve 04/16/2024 0724 by Claudene Bronwyn DASEN, RN Outcome: Progressing 04/16/2024 0720 by Claudene Bronwyn DASEN, RN Outcome: Progressing 04/16/2024 0716 by Claudene Bronwyn DASEN, RN Outcome: Progressing Goal: Verbalization of understanding the information provided will improve 04/16/2024 0724 by Claudene Bronwyn DASEN, RN Outcome: Progressing 04/16/2024 0720 by Claudene Bronwyn DASEN, RN Outcome: Progressing 04/16/2024 0716 by Claudene Bronwyn DASEN, RN Outcome: Progressing

## 2024-04-17 DIAGNOSIS — F0280 Dementia in other diseases classified elsewhere without behavioral disturbance: Secondary | ICD-10-CM | POA: Diagnosis not present

## 2024-04-17 DIAGNOSIS — F19959 Other psychoactive substance use, unspecified with psychoactive substance-induced psychotic disorder, unspecified: Secondary | ICD-10-CM | POA: Diagnosis not present

## 2024-04-17 NOTE — Progress Notes (Signed)
(  Sleep Hours) -9 (Any PRNs that were needed, meds refused, or side effects to meds)-  none (Any disturbances and when (visitation, over night)- n/a (Concerns raised by the patient)- none (SI/HI/AVH)- Denies

## 2024-04-17 NOTE — Progress Notes (Signed)
 [facility] MD Progress Note  04/17/2024 1:44 PM Allison Bolton  MRN:  [id number] Subjective:   Allison Bolton is a 53 yr old female who presented on 02/17/24 to [facility] ED with a chief complaint of anxiety. In the ED, she was found to be acutely psychotic with tangential speech, agitation, and delusional thought content. Patient was admitted involuntarily to [facility] on 8/14.  PPHx is significant for Substance-Induced Psychosis, Bipolar I Disorder, Stimulant Use Disorder, GAD, Suicide Attempt (2007), and Prior Psychiatric Hospitalizations (last- [facility] 01/2024).   Case was discussed in the multidisciplinary team. MAR was reviewed and patient was compliant with medications.  She received PRN Tylenol  and Hydroxyzine  yesterday.   Psychiatric Team made the following recommendations yesterday: -Continue Haldol  10 mg q12 for psychosis and mood stability -Continue Geodon  60 mg BID for psychosis and mood stability -Continue Depakote  DR 750 mg q12 for mood stability -Continue Cogentin  1 mg BID for Drug Induced EPS -Start Bactrim  DS BID for 10 days (Day 1 of 10)   On interview today patient reports she slept good last night.  She reports her appetite is doing good.  She reports no SI, HI, or AVH.  She reports no Paranoia or Ideas of Reference.  She reports no issues with her medications.  She reports she still has burning and frequency.  Discussed with her that we did start the antibiotic yesterday and she should begin to have improvement and she reported understanding.  She reports no other concerns at present.    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 The direct patient care time included face-to-face time with the patient, reviewing the patient's chart, communicating with other professionals, and coordinating care.    Past Psychiatric History:  Substance-Induced Psychosis, Bipolar I Disorder, Stimulant Use Disorder, GAD, Suicide Attempt (2007), and Prior Psychiatric Hospitalizations (last- [facility] 01/2024).  Past Medical History:  Past Medical History:  Diagnosis Date   Anxiety    Arthritis    Asthma    Bipolar 1 disorder (HCC)    Diverticulosis    Dysrhythmia    sts I have heart palpitations   Fibromyalgia    H/O hiatal hernia    4   Headache(784.0)    High blood pressure    Meniere's disease    S/P colonoscopy    Dr. Golda 2010: few small diverticula at sigmoid. otherwise normal.    Shortness of breath     Past Surgical History:  Procedure Laterality Date   ABDOMINAL HYSTERECTOMY     APPENDECTOMY     CESAREAN SECTION     CHOLECYSTECTOMY     COLONOSCOPY N/A 12/25/2023   Procedure: COLONOSCOPY;  Surgeon: Eartha Angelia Sieving, MD;  Location: [facility];  Service: Gastroenterology;  Laterality: N/A;  11:30am, asa 1   exploratory laparoscopy     FOOT SURGERY     HEMORRHOID SURGERY     LAPAROSCOPIC APPENDECTOMY  02/19/2011   Procedure: APPENDECTOMY LAPAROSCOPIC;  Surgeon: Oneil DELENA Budge;  Location: [facility] ORS;  Service: General;  Laterality: N/A;   LAPAROSCOPY  02/19/2011   Procedure: LAPAROSCOPY DIAGNOSTIC;  Surgeon: Oneil DELENA Budge;  Location: [facility] ORS;  Service: General;  Laterality: N/A;   left  ovarian removal     multiple hernia repairs     Right ovarian removal     TONSILLECTOMY     TONSILLECTOMY AND ADENOIDECTOMY     tubes in ears     UMBILICAL HERNIA REPAIR  Dec 2011   Dr. Mavis   wisdom teeth removal     Family History:  Family History  Problem Relation Age of Onset   Thyroid  disease Mother    Colon cancer Father    Breast cancer Maternal Aunt    Colon cancer Brother    Colon cancer Other         paternal and maternal grandfather   Meniere's disease Other    Anesthesia problems Neg Hx    Hypotension Neg Hx    Malignant hyperthermia Neg Hx    Pseudochol deficiency Neg Hx    Family Psychiatric  History:  None Reported   Social History:  Social History   Substance and Sexual Activity  Alcohol Use No   Comment: not since June 2012     Social History   Substance and Sexual Activity  Drug Use Yes   Types: Marijuana   Comment: patient denies any-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    Social History   Socioeconomic History   Marital status: Legally Separated    Spouse name: Not on file   Number of children: Not on file   Years of education: Not on file   Highest education level: Not on file  Occupational History   Not on file  Tobacco Use   Smoking status: Every Day    Current packs/day: 0.00    Average packs/day: 0.5 packs/day for 37.3 years (18.7 ttl pk-yrs)    Types: Cigarettes    Start date: 07/07/1981    Last attempt to quit: 11/05/2018    Years since quitting: 5.4   Smokeless tobacco: Never  Vaping Use   Vaping status: Never Used  Substance and Sexual Activity   Alcohol use: No    Comment: not since June 2012   Drug use: Yes    Types: Marijuana    Comment: patient denies any-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   Sexual activity: Never    Birth control/protection: Surgical  Other Topics Concern   Not on file  Social History Narrative   Not on file   Social Drivers of Health   Financial Resource Strain: Not on file  Food Insecurity: Food Insecurity Present (02/18/2024)   Hunger Vital Sign    Worried About Running Out of Food in the Last Year: Sometimes true    Ran Out of Food in the Last Year: Sometimes true  Transportation Needs: Unmet Transportation Needs (02/18/2024)   PRAPARE - Administrator, Civil Service (Medical): Yes    Lack of Transportation (Non-Medical): Yes  Physical Activity: Not on file  Stress: Not on file   Social Connections: Unknown (10/14/2021)   Received from [facility]   Social Connections    Do your friends and family support you?: Not on file    What agencies support you?: Not on file   Additional Social History:                         Sleep: Good Estimated Sleeping Duration (Last 24 Hours): 7.75-9.25 hours  Appetite:  Good  Current Medications: Current Facility-Administered Medications  Medication Dose Route Frequency Provider Last Rate Last Admin  acetaminophen  (TYLENOL ) tablet 650 mg  650 mg Oral Q6H PRN White, Patrice L, NP   650 mg at 04/15/24 2040   alum & mag hydroxide-simeth (MAALOX/MYLANTA) 200-200-20 MG/5ML suspension 30 mL  30 mL Oral Q4H PRN White, Patrice L, NP   30 mL at 03/24/24 0217   benztropine  (COGENTIN ) tablet 1 mg  1 mg Oral BID White, Patrice L, NP   1 mg at 04/17/24 0841   divalproex  (DEPAKOTE ) DR tablet 750 mg  750 mg Oral Q12H Ji, Andrew, MD   750 mg at 04/17/24 0840   haloperidol  (HALDOL ) tablet 10 mg  10 mg Oral Q12H Ji, Andrew, MD   10 mg at 04/17/24 0840   hydrOXYzine  (ATARAX ) tablet 25 mg  25 mg Oral TID PRN Teresa Jes L, NP   25 mg at 04/15/24 2040   magnesium  hydroxide (MILK OF MAGNESIA) suspension 30 mL  30 mL Oral Daily PRN Teresa Jes L, NP       nicotine  polacrilex (NICORETTE ) gum 2 mg  2 mg Oral PRN Trudy Carwin, NP   2 mg at 03/28/24 1716   OLANZapine  (ZYPREXA ) injection 10 mg  10 mg Intramuscular TID PRN White, Patrice L, NP       OLANZapine  (ZYPREXA ) injection 5 mg  5 mg Intramuscular TID PRN White, Patrice L, NP       OLANZapine  zydis (ZYPREXA ) disintegrating tablet 5 mg  5 mg Oral TID PRN Teresa Jes L, NP   5 mg at 04/06/24 2128   ondansetron  (ZOFRAN -ODT) disintegrating tablet 4 mg  4 mg Oral Q8H PRN Lynnette Barter, MD   4 mg at 04/02/24 1946   sulfamethoxazole -trimethoprim  (BACTRIM  DS) 800-160 MG per tablet 1 tablet  1 tablet Oral Q12H Vonda Harth S, DO   1 tablet at 04/17/24 9156   traZODone   (DESYREL ) tablet 50 mg  50 mg Oral QHS White, Patrice L, NP   50 mg at 04/16/24 2053   ziprasidone  (GEODON ) capsule 60 mg  60 mg Oral BID WC Marry Clamp, MD   60 mg at 04/17/24 9158    Lab Results:  Results for orders placed or performed during the hospital encounter of 02/18/24 (from the past 48 hours)  Urinalysis, w/ Reflex to Culture (Infection Suspected) -Urine, Unspecified Source     Status: Abnormal   Collection Time: 04/16/24  5:15 PM  Result Value Ref Range   Specimen Source URINE, UNSPE    Color, Urine YELLOW YELLOW   APPearance CLOUDY (A) CLEAR   Specific Gravity, Urine 1.014 1.005 - 1.030   pH 6.0 5.0 - 8.0   Glucose, UA NEGATIVE NEGATIVE mg/dL   Hgb urine dipstick SMALL (A) NEGATIVE   Bilirubin Urine NEGATIVE NEGATIVE   Ketones, ur NEGATIVE NEGATIVE mg/dL   Protein, ur 30 (A) NEGATIVE mg/dL   Nitrite POSITIVE (A) NEGATIVE   Leukocytes,Ua LARGE (A) NEGATIVE   RBC / HPF 0-5 0 - 5 RBC/hpf   WBC, UA >50 0 - 5 WBC/hpf    Comment:        Reflex urine culture not performed if WBC <=10, OR if Squamous epithelial cells >5. If Squamous epithelial cells >5 suggest recollection.    Bacteria, UA FEW (A) NONE SEEN   Squamous Epithelial / HPF 0-5 0 - 5 /HPF   Mucus PRESENT     Comment: Performed at [facility], [address] [place], [place] [address]      Blood Alcohol level:  Lab Results  Component Value Date  ETH <15 02/17/2024   ETH <15 01/14/2024    Metabolic Disorder Labs: Lab Results  Component Value Date   HGBA1C 5.1 01/18/2024   MPG 99.67 01/18/2024   MPG 105.41 05/11/2019   Lab Results  Component Value Date   PROLACTIN 24.1 (H) 05/11/2019   Lab Results  Component Value Date   CHOL 154 01/18/2024   TRIG 180 (H) 01/18/2024   HDL 49 01/18/2024   CHOLHDL 3.1 01/18/2024   VLDL 36 01/18/2024   LDLCALC 69 01/18/2024   LDLCALC 82 05/11/2019    Physical Findings: AIMS:  ,  ,  ,  ,  ,  ,   CIWA:    COWS:      Musculoskeletal: Strength & Muscle Tone: within normal limits Gait & Station: normal Patient leans: N/A  Psychiatric Specialty Exam:  Presentation  General Appearance:  Casual  Eye Contact: Fair  Speech: Clear and Coherent; Normal Rate  Speech Volume: Normal  Handedness:No data recorded  Mood and Affect  Mood: Euthymic  Affect: Other (comment) (smiling)   Thought Process  Thought Processes: Linear  Descriptions of Associations:Loose  Orientation:Partial  Thought Content:Scattered  History of Schizophrenia/Schizoaffective disorder:No  Duration of Psychotic Symptoms:Greater than six months  Hallucinations:Hallucinations: None   Ideas of Reference:None  Suicidal Thoughts:Suicidal Thoughts: No   Homicidal Thoughts:Homicidal Thoughts: No    Sensorium  Memory: Immediate Poor; Recent Poor  Judgment: Impaired  Insight: Shallow   Executive Functions  Concentration: Fair  Attention Span: Fair  Recall: Poor  Fund of Knowledge: Poor  Language: Fair   Psychomotor Activity  Psychomotor Activity:Psychomotor Activity: Normal    Assets  Assets: Resilience   Sleep  Sleep:Sleep: Good     Physical Exam: Physical Exam Vitals and nursing note reviewed.  Constitutional:      General: She is not in acute distress.    Appearance: She is normal weight. She is not toxic-appearing.  HENT:     Head: Normocephalic and atraumatic.  Pulmonary:     Effort: Pulmonary effort is normal.  Musculoskeletal:        General: Normal range of motion.  Neurological:     General: No focal deficit present.     Mental Status: She is alert.    Review of Systems  Respiratory:  Negative for cough and shortness of breath.   Cardiovascular:  Negative for chest pain.  Gastrointestinal:  Negative for abdominal pain, constipation, diarrhea, nausea and vomiting.  Genitourinary:  Positive for dysuria and frequency.  Neurological:  Negative for  dizziness, weakness and headaches.  Psychiatric/Behavioral:  Negative for depression, hallucinations and suicidal ideas. The patient is not nervous/anxious.    Blood pressure 92/68, pulse 79, temperature (!) 97.3 F (36.3 C), temperature source Oral, resp. rate 14, height 5' 6 (1.676 m), weight 80.7 kg, SpO2 94%. Body mass index is 28.71 kg/m.   Treatment Plan Summary: Daily contact with patient to assess and evaluate symptoms and progress in treatment and Medication management  NATAYA BASTEDO is a 53 yr old female who presented on 02/17/24 to [facility] ED with a chief complaint of anxiety. In the ED, she was found to be acutely psychotic with tangential speech, agitation, and delusional thought content. Patient was admitted involuntarily to [facility] on 8/14.  PPHx is significant for Substance-Induced Psychosis, Bipolar I Disorder, Stimulant Use Disorder, GAD, Suicide Attempt (2007), and Prior Psychiatric Hospitalizations (last- [facility] 01/2024).   Aysia has continued to be stable from a psychiatric standpoint.  She was started on  Bactrim  yesterday and has tolerated it so far.  We will not make any changes to her medications at this time.  We will continue to monitor.    Substance-induced psychosis  Bipolar disorder: -Continue Haldol  10 mg q12 for psychosis and mood stability -Continue Geodon  60 mg BID for psychosis and mood stability -Continue Depakote  DR 750 mg q12 for mood stability -Continue Cogentin  1 mg BID for Drug Induced EPS -Continue Agitation Protocol: Zyprexa    Dementia in other diseases classified elsewhere, unspecified severity, without behavioral disturbance, psychotic disturbance, mood disturbance, and anxiety: [facility] was given a MOCA on 9/17 and scored an 8.  [facility] SW is seeking a guardian for the patient as she needs 24 hour assistance at this point and they are looking for placement at either SNF or group home.     Nicotine  Dependence: -Continue Nicotine  Gum 2 mg  PRN   Suspected UTI: -Obtain UA -Continue Bactrim  DS BID for 10 days (Day 2 of 10)   -Continue Zofran  4 mg q8 PRN nausea/vomiting -Continue PRN's: Tylenol , Maalox, Atarax , Milk of Magnesia, Trazodone    --  The risks/benefits/side-effects/alternatives to medications were discussed in detail with the patient and time was given for questions. The patient consents to medication trials.                -- Metabolic profile and EKG monitoring obtained while on an atypical antipsychotic (BMI: 28.73 Lipid Panel: WNL except Trig: 180 HbgA1c: 5.1)              -- Encouraged patient to participate in unit milieu and in scheduled group therapies              -- Short Term Goals: Ability to identify changes in lifestyle to reduce recurrence of condition will improve, Ability to verbalize feelings will improve, Ability to disclose and discuss suicidal ideas, Ability to demonstrate self-control will improve, Ability to identify and develop effective coping behaviors will improve, Ability to maintain clinical measurements within normal limits will improve, Compliance with prescribed medications will improve, and Ability to identify triggers associated with substance abuse/mental health issues will improve             -- Long Term Goals: Improvement in symptoms so as ready for discharge   Safety and Monitoring:             -- Involuntary admission to inpatient psychiatric unit for safety, stabilization and treatment             -- Daily contact with patient to assess and evaluate symptoms and progress in treatment             -- Patient's case to be discussed in multi-disciplinary team meeting             -- Observation Level : q15 minute checks             -- Vital signs:  q12 hours             -- Precautions: suicide, elopement, and assault  Discharge Planning:              -- Social work and case management to assist with discharge planning and identification of hospital follow-up needs prior to  discharge             -- Estimated LOS: 7+ more days             -- Discharge Concerns: Need to establish a safety plan; Medication compliance and effectiveness             --  Discharge Goals: Return home with outpatient referrals for mental health follow-up including medication management/psychotherapy   Marsa GORMAN Rosser, DO 04/17/2024, 1:44 PM

## 2024-04-17 NOTE — B[facility] Group Notes (Signed)
 Adult Psychoeducational Group Note  Date:  04/17/2024 Time:  6:29 PM  Group Topic/Focus:  Goals Group:   The focus of this group is to help patients establish daily goals to achieve during treatment and discuss how the patient can incorporate goal setting into their daily lives to aide in recovery.  Participation Level:  Active  Participation Quality:  Appropriate  Affect:  Appropriate  Cognitive:  Appropriate  Insight: Appropriate  Engagement in Group:  Engaged  Modes of Intervention:  Discussion  Additional Comments:  Patient engage in group.  Reade Trefz Lee 04/17/2024, 6:29 PM

## 2024-04-17 NOTE — Plan of Care (Signed)
   Problem: Education: Goal: Mental status will improve Outcome: Progressing Goal: Verbalization of understanding the information provided will improve Outcome: Progressing   Problem: Activity: Goal: Interest or engagement in activities will improve Outcome: Progressing Goal: Sleeping patterns will improve Outcome: Progressing

## 2024-04-18 DIAGNOSIS — F0280 Dementia in other diseases classified elsewhere without behavioral disturbance: Secondary | ICD-10-CM | POA: Diagnosis not present

## 2024-04-18 DIAGNOSIS — F19959 Other psychoactive substance use, unspecified with psychoactive substance-induced psychotic disorder, unspecified: Secondary | ICD-10-CM | POA: Diagnosis not present

## 2024-04-18 LAB — URINE CULTURE: Culture: >=100000 — AB

## 2024-04-18 MED ORDER — CIPROFLOXACIN HCL 500 MG PO TABS: 500.0000 mg | ORAL_TABLET | Freq: Two times a day (BID) | ORAL | Status: DC

## 2024-04-18 MED ORDER — CIPROFLOXACIN HCL 500 MG PO TABS
500.0000 mg | ORAL_TABLET | Freq: Two times a day (BID) | ORAL | Status: AC
  Administered 2024-04-18 – 2024-04-28 (×20): 500 mg via ORAL
  Filled 2024-04-18 (×20): qty 2

## 2024-04-18 NOTE — Plan of Care (Signed)

## 2024-04-18 NOTE — Group Note (Signed)
 Recreation Therapy Group Note   Group Topic:Self-Esteem  Group Date: 04/18/2024 Start Time: 1000 End Time: 1030 Facilitators: Josafat Enrico-McCall, LRT,CTRS Location: [address] Dayroom   Group Topic: Self Esteem    Goal Area(s) Addresses:  Patient will appropriately identify what self esteem is.  Patient will create a shield of armor describing themselves.  Patient will successfully identify positive attributes about themselves.  Patient will acknowledge benefit of improved self-esteem.   Behavioral Response:   Intervention / Activity: Self-Esteem Shield. Patient attended a recreation therapy group session focused on self esteem. Patient identified what self esteem is, and why it is important to have high self esteem during group discussion. LRT wrote on the white board, drawing the outline of the shield and labeling the quadrants.  Patient was asked to create their own shield to show off their unique attributes, four quadrants reflected the following:  The Upper Left quadrant- reasons they are unique/special. The Upper Right quadrant- things that they love to do. The Lower Left quadrant- goals for their future. The Lower Right quadrant- character words that describe them.    Patients were provided sheets with the shield printed on them and colored pencils, markers and crayons to complete the activity.  Patients and writer had group related discussions while individually working on their activity.  Patients were debriefed on the importance of healthy self esteem and offered a handout for ways to increase self esteem.   Education: Self esteem, Communication, Positive self-talk, Discharge Planning   Education Outcome: Acknowledges education/Verbalizes understanding of Education/In group clarification offered/Needs further education    Clinical Observations/Individualized Feedback: Recreation therapy group was to occur at this time. Due to lack of staffing, LRT was assisting with  the dayroom on [address] hall.     Plan: Continue to engage patient in RT group sessions 2-3x/week.   Srija Southard-McCall, LRT,CTRS 04/18/2024 12:00 PM

## 2024-04-18 NOTE — Progress Notes (Signed)
 [facility] MD Progress Note  04/18/2024 2:23 PM Allison Bolton  MRN:  [id number] Subjective:   Allison Bolton is a 53 yr old female who presented on 02/17/24 to [facility] ED with a chief complaint of anxiety. In the ED, she was found to be acutely psychotic with tangential speech, agitation, and delusional thought content. Patient was admitted involuntarily to [facility] on 8/14.  PPHx is significant for Substance-Induced Psychosis, Bipolar I Disorder, Stimulant Use Disorder, GAD, Suicide Attempt (2007), and Prior Psychiatric Hospitalizations (last- [facility] 01/2024).   Case was discussed in the multidisciplinary team. MAR was reviewed and patient was compliant with medications.  She received PRN Tylenol  and Hydroxyzine  yesterday.   Psychiatric Team made the following recommendations yesterday: -Continue Haldol  10 mg q12 for psychosis and mood stability -Continue Geodon  60 mg BID for psychosis and mood stability -Continue Depakote  DR 750 mg q12 for mood stability -Continue Cogentin  1 mg BID for Drug Induced EPS -Continue Bactrim  DS BID for 10 days (Day 2 of 10)    On interview today patient reports she slept good last night.  She reports her appetite is doing good.  She reports no SI, HI, or AVH.  She reports no Paranoia or Ideas of Reference.  She reports no issues with her medications.  She reports that her burning and frequency are improving.  She reports no other concerns at present.    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 The direct patient care time included face-to-face time with the patient, reviewing the patient's chart, communicating  with other professionals, and coordinating care.    Past Psychiatric History:  Substance-Induced Psychosis, Bipolar I Disorder, Stimulant Use Disorder, GAD, Suicide Attempt (2007), and Prior Psychiatric Hospitalizations (last- [facility] 01/2024).  Past Medical History:  Past Medical History:  Diagnosis Date   Anxiety    Arthritis    Asthma    Bipolar 1 disorder (HCC)    Diverticulosis    Dysrhythmia    sts I have heart palpitations   Fibromyalgia    H/O hiatal hernia    4   Headache(784.0)    High blood pressure    Meniere's disease    S/P colonoscopy    Dr. Golda 2010: few small diverticula at sigmoid. otherwise normal.    Shortness of breath     Past Surgical History:  Procedure Laterality Date   ABDOMINAL HYSTERECTOMY     APPENDECTOMY     CESAREAN SECTION     CHOLECYSTECTOMY     COLONOSCOPY N/A 12/25/2023   Procedure: COLONOSCOPY;  Surgeon: Eartha Angelia Sieving, MD;  Location: [facility];  Service: Gastroenterology;  Laterality: N/A;  11:30am, asa 1   exploratory laparoscopy     FOOT SURGERY     HEMORRHOID SURGERY     LAPAROSCOPIC APPENDECTOMY  02/19/2011   Procedure: APPENDECTOMY LAPAROSCOPIC;  Surgeon: Oneil DELENA Budge;  Location: [facility] ORS;  Service: General;  Laterality: N/A;   LAPAROSCOPY  02/19/2011   Procedure: LAPAROSCOPY DIAGNOSTIC;  Surgeon: Oneil DELENA Budge;  Location: [facility] ORS;  Service: General;  Laterality: N/A;   left ovarian removal     multiple hernia repairs     Right ovarian removal  TONSILLECTOMY     TONSILLECTOMY AND ADENOIDECTOMY     tubes in ears     UMBILICAL HERNIA REPAIR  Dec 2011   Dr. Mavis   wisdom teeth removal     Family History:  Family History  Problem Relation Age of Onset   Thyroid  disease Mother    Colon cancer Father    Breast cancer Maternal Aunt    Colon cancer Brother    Colon cancer Other        paternal and maternal grandfather   Meniere's disease Other    Anesthesia problems Neg Hx    Hypotension Neg Hx     Malignant hyperthermia Neg Hx    Pseudochol deficiency Neg Hx    Family Psychiatric  History:  None Reported   Social History:  Social History   Substance and Sexual Activity  Alcohol Use No   Comment: not since June 2012     Social History   Substance and Sexual Activity  Drug Use Yes   Types: Marijuana   Comment: patient denies any-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    Social History   Socioeconomic History   Marital status: Legally Separated    Spouse name: Not on file   Number of children: Not on file   Years of education: Not on file   Highest education level: Not on file  Occupational History   Not on file  Tobacco Use   Smoking status: Every Day    Current packs/day: 0.00    Average packs/day: 0.5 packs/day for 37.3 years (18.7 ttl pk-yrs)    Types: Cigarettes    Start date: 07/07/1981    Last attempt to quit: 11/05/2018    Years since quitting: 5.4   Smokeless tobacco: Never  Vaping Use   Vaping status: Never Used  Substance and Sexual Activity   Alcohol use: No    Comment: not since June 2012   Drug use: Yes    Types: Marijuana    Comment: patient denies any-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   Sexual activity: Never    Birth control/protection: Surgical  Other Topics Concern   Not on file  Social History Narrative   Not on file   Social Drivers of Health   Financial Resource Strain: Not on file  Food Insecurity: Food Insecurity Present (02/18/2024)   Hunger Vital Sign    Worried About Running Out of Food in the Last Year: Sometimes true    Ran Out of Food in the Last Year: Sometimes true  Transportation Needs: Unmet Transportation Needs (02/18/2024)   PRAPARE - Administrator, Civil Service (Medical): Yes    Lack of Transportation (Non-Medical): Yes  Physical Activity: Not on file  Stress: Not on file  Social Connections: Unknown (10/14/2021)   Received from [facility]   Social Connections    Do your  friends and family support you?: Not on file    What agencies support you?: Not on file   Additional Social History:                         Sleep: Good Estimated Sleeping Duration (Last 24 Hours): 6.00-8.75 hours  Appetite:  Good  Current Medications: Current Facility-Administered Medications  Medication Dose Route Frequency Provider Last Rate Last Admin   acetaminophen  (TYLENOL ) tablet 650 mg  650 mg Oral Q6H PRN White, Patrice L, NP   650 mg  at 04/15/24 2040   alum & mag hydroxide-simeth (MAALOX/MYLANTA) 200-200-20 MG/5ML suspension 30 mL  30 mL Oral Q4H PRN White, Patrice L, NP   30 mL at 03/24/24 0217   benztropine  (COGENTIN ) tablet 1 mg  1 mg Oral BID White, Patrice L, NP   1 mg at 04/18/24 1037   ciprofloxacin (CIPRO) tablet 500 mg  500 mg Oral BID Sarabella Caprio S, DO       divalproex  (DEPAKOTE ) DR tablet 750 mg  750 mg Oral Q12H Ji, Andrew, MD   750 mg at 04/18/24 1037   haloperidol  (HALDOL ) tablet 10 mg  10 mg Oral Q12H Ji, Andrew, MD   10 mg at 04/18/24 1037   hydrOXYzine  (ATARAX ) tablet 25 mg  25 mg Oral TID PRN Teresa Jes L, NP   25 mg at 04/18/24 1408   magnesium  hydroxide (MILK OF MAGNESIA) suspension 30 mL  30 mL Oral Daily PRN White, Patrice L, NP       nicotine  polacrilex (NICORETTE ) gum 2 mg  2 mg Oral PRN Trudy Carwin, NP   2 mg at 03/28/24 1716   OLANZapine  (ZYPREXA ) injection 10 mg  10 mg Intramuscular TID PRN White, Patrice L, NP       OLANZapine  (ZYPREXA ) injection 5 mg  5 mg Intramuscular TID PRN White, Patrice L, NP       OLANZapine  zydis (ZYPREXA ) disintegrating tablet 5 mg  5 mg Oral TID PRN White, Patrice L, NP   5 mg at 04/18/24 0103   ondansetron  (ZOFRAN -ODT) disintegrating tablet 4 mg  4 mg Oral Q8H PRN Lynnette Barter, MD   4 mg at 04/02/24 1946   traZODone  (DESYREL ) tablet 50 mg  50 mg Oral QHS White, Patrice L, NP   50 mg at 04/17/24 2109   ziprasidone  (GEODON ) capsule 60 mg  60 mg Oral BID WC Marry Clamp, MD   60 mg at 04/18/24 1037     Lab Results:  Results for orders placed or performed during the hospital encounter of 02/18/24 (from the past 48 hours)  Urinalysis, w/ Reflex to Culture (Infection Suspected) -Urine, Unspecified Source     Status: Abnormal   Collection Time: 04/16/24  5:15 PM  Result Value Ref Range   Specimen Source URINE, UNSPE    Color, Urine YELLOW YELLOW   APPearance CLOUDY (A) CLEAR   Specific Gravity, Urine 1.014 1.005 - 1.030   pH 6.0 5.0 - 8.0   Glucose, UA NEGATIVE NEGATIVE mg/dL   Hgb urine dipstick SMALL (A) NEGATIVE   Bilirubin Urine NEGATIVE NEGATIVE   Ketones, ur NEGATIVE NEGATIVE mg/dL   Protein, ur 30 (A) NEGATIVE mg/dL   Nitrite POSITIVE (A) NEGATIVE   Leukocytes,Ua LARGE (A) NEGATIVE   RBC / HPF 0-5 0 - 5 RBC/hpf   WBC, UA >50 0 - 5 WBC/hpf    Comment:        Reflex urine culture not performed if WBC <=10, OR if Squamous epithelial cells >5. If Squamous epithelial cells >5 suggest recollection.    Bacteria, UA FEW (A) NONE SEEN   Squamous Epithelial / HPF 0-5 0 - 5 /HPF   Mucus PRESENT     Comment: Performed at [facility], [address], [place], [place] [address]  Urine Culture     Status: Abnormal   Collection Time: 04/16/24  5:15 PM   Specimen: Urine, Random  Result Value Ref Range   Specimen Description      URINE, RANDOM Performed at [facility]  [facility], [address] [place], [place] [address]    Special Requests      NONE Reflexed from [id number] Performed at [facility], [address] [place], [place] [address]    Culture >=100,000 COLONIES/mL ESCHERICHIA COLI (A)    Report Status 04/18/2024 FINAL    Organism ID, Bacteria ESCHERICHIA COLI (A)       Susceptibility   Escherichia coli - MIC*    AMPICILLIN >=32 RESISTANT Resistant     CEFAZOLIN (URINE) Value in next row Sensitive      8 SENSITIVEThis is a modified FDA-approved test that has been validated and its performance characteristics determined  by the reporting laboratory.  This laboratory is certified under the Clinical Laboratory Improvement Amendments CLIA as qualified to perform high complexity clinical laboratory testing.    CEFEPIME Value in next row Sensitive      8 SENSITIVEThis is a modified FDA-approved test that has been validated and its performance characteristics determined by the reporting laboratory.  This laboratory is certified under the Clinical Laboratory Improvement Amendments CLIA as qualified to perform high complexity clinical laboratory testing.    ERTAPENEM  Value in next row Sensitive      8 SENSITIVEThis is a modified FDA-approved test that has been validated and its performance characteristics determined by the reporting laboratory.  This laboratory is certified under the Clinical Laboratory Improvement Amendments CLIA as qualified to perform high complexity clinical laboratory testing.    CEFTRIAXONE  Value in next row Sensitive      8 SENSITIVEThis is a modified FDA-approved test that has been validated and its performance characteristics determined by the reporting laboratory.  This laboratory is certified under the Clinical Laboratory Improvement Amendments CLIA as qualified to perform high complexity clinical laboratory testing.    CIPROFLOXACIN Value in next row Sensitive      8 SENSITIVEThis is a modified FDA-approved test that has been validated and its performance characteristics determined by the reporting laboratory.  This laboratory is certified under the Clinical Laboratory Improvement Amendments CLIA as qualified to perform high complexity clinical laboratory testing.    GENTAMICIN Value in next row Sensitive      8 SENSITIVEThis is a modified FDA-approved test that has been validated and its performance characteristics determined by the reporting laboratory.  This laboratory is certified under the Clinical Laboratory Improvement Amendments CLIA as qualified to perform high complexity clinical laboratory  testing.    NITROFURANTOIN  Value in next row Sensitive      8 SENSITIVEThis is a modified FDA-approved test that has been validated and its performance characteristics determined by the reporting laboratory.  This laboratory is certified under the Clinical Laboratory Improvement Amendments CLIA as qualified to perform high complexity clinical laboratory testing.    TRIMETH /SULFA  Value in next row Resistant      8 SENSITIVEThis is a modified FDA-approved test that has been validated and its performance characteristics determined by the reporting laboratory.  This laboratory is certified under the Clinical Laboratory Improvement Amendments CLIA as qualified to perform high complexity clinical laboratory testing.    AMPICILLIN/SULBACTAM Value in next row Resistant      8 SENSITIVEThis is a modified FDA-approved test that has been validated and its performance characteristics determined by the reporting laboratory.  This laboratory is certified under the Clinical Laboratory Improvement Amendments CLIA as qualified to perform high complexity clinical laboratory testing.    PIP/TAZO Value in next row Sensitive      <=4 SENSITIVEThis is a modified  FDA-approved test that has been validated and its performance characteristics determined by the reporting laboratory.  This laboratory is certified under the Clinical Laboratory Improvement Amendments CLIA as qualified to perform high complexity clinical laboratory testing.    MEROPENEM Value in next row Sensitive      <=4 SENSITIVEThis is a modified FDA-approved test that has been validated and its performance characteristics determined by the reporting laboratory.  This laboratory is certified under the Clinical Laboratory Improvement Amendments CLIA as qualified to perform high complexity clinical laboratory testing.    * >=100,000 COLONIES/mL ESCHERICHIA COLI      Blood Alcohol level:  Lab Results  Component Value Date   [facility] <15 02/17/2024   ETH <15  01/14/2024    Metabolic Disorder Labs: Lab Results  Component Value Date   HGBA1C 5.1 01/18/2024   MPG 99.67 01/18/2024   MPG 105.41 05/11/2019   Lab Results  Component Value Date   PROLACTIN 24.1 (H) 05/11/2019   Lab Results  Component Value Date   CHOL 154 01/18/2024   TRIG 180 (H) 01/18/2024   HDL 49 01/18/2024   CHOLHDL 3.1 01/18/2024   VLDL 36 01/18/2024   LDLCALC 69 01/18/2024   LDLCALC 82 05/11/2019    Physical Findings: AIMS:  ,  ,  ,  ,  ,  ,   CIWA:    COWS:     Musculoskeletal: Strength & Muscle Tone: within normal limits Gait & Station: normal Patient leans: N/A  Psychiatric Specialty Exam:  Presentation  General Appearance:  Casual  Eye Contact: Fair  Speech: Clear and Coherent; Normal Rate  Speech Volume: Normal  Handedness:No data recorded  Mood and Affect  Mood: Euthymic  Affect: Other (comment) (smiling)   Thought Process  Thought Processes: Linear  Descriptions of Associations:Loose  Orientation:Partial  Thought Content:Scattered  History of Schizophrenia/Schizoaffective disorder:No  Duration of Psychotic Symptoms:Greater than six months  Hallucinations:Hallucinations: None   Ideas of Reference:None  Suicidal Thoughts:Suicidal Thoughts: No   Homicidal Thoughts:Homicidal Thoughts: No    Sensorium  Memory: Immediate Poor; Recent Poor  Judgment: Impaired  Insight: Shallow   Executive Functions  Concentration: Fair  Attention Span: Fair  Recall: Poor  Fund of Knowledge: Poor  Language: Fair   Psychomotor Activity  Psychomotor Activity:Psychomotor Activity: Normal    Assets  Assets: Resilience   Sleep  Sleep:Sleep: Good     Physical Exam: Physical Exam Vitals and nursing note reviewed.  Constitutional:      General: She is not in acute distress.    Appearance: She is normal weight. She is not ill-appearing or toxic-appearing.  HENT:     Head: Normocephalic and  atraumatic.  Pulmonary:     Effort: Pulmonary effort is normal.  Musculoskeletal:        General: Normal range of motion.  Neurological:     General: No focal deficit present.     Mental Status: She is alert.    Review of Systems  Respiratory:  Negative for cough and shortness of breath.   Cardiovascular:  Negative for chest pain.  Gastrointestinal:  Negative for abdominal pain, constipation, diarrhea, nausea and vomiting.  Genitourinary:  Positive for dysuria and frequency.  Neurological:  Negative for dizziness, weakness and headaches.  Psychiatric/Behavioral:  Negative for depression, hallucinations and suicidal ideas. The patient is not nervous/anxious.    Blood pressure 92/68, pulse 70, temperature (!) 97.3 F (36.3 C), temperature source Oral, resp. rate 14, height 5' 6 (1.676 m), weight 80.7 kg, SpO2 94%.  Body mass index is 28.71 kg/m.   Treatment Plan Summary: Daily contact with patient to assess and evaluate symptoms and progress in treatment and Medication management  LINDEN MIKES is a 53 yr old female who presented on 02/17/24 to [facility] ED with a chief complaint of anxiety. In the ED, she was found to be acutely psychotic with tangential speech, agitation, and delusional thought content. Patient was admitted involuntarily to [facility] on 8/14.  PPHx is significant for Substance-Induced Psychosis, Bipolar I Disorder, Stimulant Use Disorder, GAD, Suicide Attempt (2007), and Prior Psychiatric Hospitalizations (last- [facility] 01/2024).   Sheril is stable from a psychiatric standpoint.  She is reporting improvement in her burning and frequency.  Her UA shows E Coli that is resistant to Bactrim  so we will stop Bactrim  and start Cipro which it is sensitive to.  We will not make any other changes to her medications at this time.  We will continue to monitor.   Substance-induced psychosis  Bipolar disorder: -Continue Haldol  10 mg q12 for psychosis and mood stability -Continue  Geodon  60 mg BID for psychosis and mood stability -Continue Depakote  DR 750 mg q12 for mood stability -Continue Cogentin  1 mg BID for Drug Induced EPS -Continue Agitation Protocol: Zyprexa    Dementia in other diseases classified elsewhere, unspecified severity, without behavioral disturbance, psychotic disturbance, mood disturbance, and anxiety: Hollan was given a MOCA on 9/17 and scored an 8.  [facility] SW is seeking a guardian for the patient as she needs 24 hour assistance at this point and they are looking for placement at either SNF or group home.     Nicotine  Dependence: -Continue Nicotine  Gum 2 mg PRN   Suspected UTI: -UA shows E. Coli resistant to Bactrim  -Stop Bactrim  -Start Cipro 500 mg BID for 10 days (Day 1 of 10)   -Continue Zofran  4 mg q8 PRN nausea/vomiting -Continue PRN's: Tylenol , Maalox, Atarax , Milk of Magnesia, Trazodone    --  The risks/benefits/side-effects/alternatives to medications were discussed in detail with the patient and time was given for questions. The patient consents to medication trials.                -- Metabolic profile and EKG monitoring obtained while on an atypical antipsychotic (BMI: 28.73 Lipid Panel: WNL except Trig: 180 HbgA1c: 5.1)              -- Encouraged patient to participate in unit milieu and in scheduled group therapies              -- Short Term Goals: Ability to identify changes in lifestyle to reduce recurrence of condition will improve, Ability to verbalize feelings will improve, Ability to disclose and discuss suicidal ideas, Ability to demonstrate self-control will improve, Ability to identify and develop effective coping behaviors will improve, Ability to maintain clinical measurements within normal limits will improve, Compliance with prescribed medications will improve, and Ability to identify triggers associated with substance abuse/mental health issues will improve             -- Long Term Goals: Improvement in symptoms so as  ready for discharge   Safety and Monitoring:             -- Involuntary admission to inpatient psychiatric unit for safety, stabilization and treatment             -- Daily contact with patient to assess and evaluate symptoms and progress in treatment             --  Patient's case to be discussed in multi-disciplinary team meeting             -- Observation Level : q15 minute checks             -- Vital signs:  q12 hours             -- Precautions: suicide, elopement, and assault  Discharge Planning:              -- Social work and case management to assist with discharge planning and identification of hospital follow-up needs prior to discharge             -- Estimated LOS: 7+ more days             -- Discharge Concerns: Need to establish a safety plan; Medication compliance and effectiveness             -- Discharge Goals: Return home with outpatient referrals for mental health follow-up including medication management/psychotherapy   Marsa GORMAN Rosser, DO 04/18/2024, 2:23 PM

## 2024-04-18 NOTE — BH Assessment (Signed)
(  Sleep Hours) - 5.25 (Any PRNs that were needed, meds refused, or side effects to meds)-  (Any disturbances and when (visitation, over night)- None (Concerns raised by the patient)- None (SI/HI/AVH)- Denies

## 2024-04-18 NOTE — Group Note (Signed)
 LCSW Group Therapy Note   Group Date: 04/18/2024 Start Time: 1300 End Time: 1400   Participation:  did not attend  Type of Therapy:  Group Therapy  Topic:  Healing Flames: Navigating Anger with Compassion  Objective:  Foster self-awareness and promote compassion toward oneself and others when dealing with anger.  Goals:  Help participants understand the underlying emotions and needs fueling anger. Provide coping strategies for healthier emotional expression and anger management.  Summary: This session explored anger as a volcano--an explosion driven by deeper feelings and unmet needs. Participants learned to identify anger triggers and underlying emotions, then practiced coping strategies like deep breathing, physical activity, and journaling. The group discussed healthy ways to manage anger before it escalates, using both personal reflection and shared experiences.  Therapeutic Modalities: Cognitive Behavioral Therapy (CBT): Challenging thoughts that fuel anger. Mindfulness: Increasing awareness of emotions and sensations.   Mathilde Mcwherter O Melodee Lupe, LCSWA 04/18/2024  3:00 PM

## 2024-04-18 NOTE — Group Note (Signed)
 Date:  04/18/2024 Time:  8:16 PM  Group Topic/Focus:  Wrap-Up Group:   The focus of this group is to help patients review their daily goal of treatment and discuss progress on daily workbooks.    Participation Level:  Did Not Attend  Participation Quality:  N/A  Affect:  N/A  Cognitive:  N/A  Insight: None  Engagement in Group:  N/A  Modes of Intervention:  N/A  Additional Comments:  Patient was encouraged but did not attend.  Eward Mace 04/18/2024, 8:16 PM

## 2024-04-18 NOTE — Progress Notes (Addendum)
 Group [facility] [place], [place]) [phone]   CSW faxed Quantiferon TB Gold Plus test results to [phone].   [facility] [place], [place]) [phone]  CSW spoke with Donzell who confirmed receiving TB test results.  She said she will call CSW regarding bed availability on Monday, 04/25/2024.   Jamyiah Labella, LCSWA 04/18/2024

## 2024-04-18 NOTE — Group Note (Addendum)
 Date:  04/17/2024 Time:  2000  Group Topic/Focus:  Understanding Mental Health using Ask Me 3    Participation Level:  Active  Participation Quality:  Appropriate  Affect:  Appropriate  Cognitive:  Appropriate  Insight: Appropriate  Engagement in Group:  Engaged  Modes of Intervention:  Discussion and Education  Additional Comments:  Pt actively engage during group and lead the group with grounding exercises.   Roark Delaware 04/18/2024, 2:47 AM

## 2024-04-18 NOTE — Plan of Care (Signed)
   Problem: Education: Goal: Knowledge of [facility] Education information/materials will improve Outcome: Progressing Goal: Emotional status will improve Outcome: Progressing Goal: Mental status will improve Outcome: Progressing Goal: Verbalization of understanding the information provided will improve Outcome: Progressing

## 2024-04-18 NOTE — Progress Notes (Signed)
   04/18/24 1037  Psych Admission Type (Psych Patients Only)  Admission Status Involuntary  Psychosocial Assessment  Patient Complaints None  Eye Contact Fair  Facial Expression Animated  Affect Inconsistent with thought content  Speech Soft  Interaction Minimal  Motor Activity Other (Comment) (WDL)  Appearance/Hygiene Bizarre;Disheveled  Behavior Characteristics Cooperative;Calm  Mood Preoccupied  Thought Process  Coherency Disorganized  Content Preoccupation  Delusions None reported or observed  Perception WDL  Hallucination None reported or observed  Judgment Poor  Confusion Moderate  Danger to Self  Current suicidal ideation? Denies  Agreement Not to Harm Self Yes  Description of Agreement Verbal

## 2024-04-19 DIAGNOSIS — F312 Bipolar disorder, current episode manic severe with psychotic features: Secondary | ICD-10-CM | POA: Diagnosis not present

## 2024-04-19 DIAGNOSIS — F1990 Other psychoactive substance use, unspecified, uncomplicated: Secondary | ICD-10-CM | POA: Diagnosis not present

## 2024-04-19 DIAGNOSIS — F0280 Dementia in other diseases classified elsewhere without behavioral disturbance: Secondary | ICD-10-CM | POA: Diagnosis not present

## 2024-04-19 NOTE — Group Note (Signed)
 Date:  04/19/2024 Time:  9:53 AM  Group Topic/Focus:  Goals Group:   The focus of this group is to help patients establish daily goals to achieve during treatment and discuss how the patient can incorporate goal setting into their daily lives to aide in recovery. Orientation:   The focus of this group is to educate the patient on the purpose and policies of crisis stabilization and provide a format to answer questions about their admission.  The group details unit policies and expectations of patients while admitted.    Participation Level:  Minimal  Participation Quality:  Appropriate  Affect:  Appropriate  Cognitive:  Appropriate  Insight: Appropriate  Engagement in Group:  Developing/Improving  Modes of Intervention:  Discussion and Orientation  Additional Comments:    Nels Munn D Amberia Bayless 04/19/2024, 9:53 AM

## 2024-04-19 NOTE — Group Note (Signed)
 Recreation Therapy Group Note   Group Topic:Healthy Decision Making  Group Date: 04/19/2024 Start Time: 1030 End Time: 1100 Facilitators: Channah Godeaux-McCall, LRT,CTRS Location: [address] Dayroom   Group Topic: Decision Making, Problem Solving, Communication  Goal Area(s) Addresses:  Patient will effectively work with peer towards shared goal.  Patient will identify factors that guided their decision making.  Patient will pro-socially communicate ideas during group session.   Behavioral Response:   Intervention: Survival Scenario - pencil, paper  Activity: Patients were given a scenario that they were going to be stranded on a deserted [place] for several months before being rescued. Writer tasked them with making a list of 15 things they would choose to bring with them for survival. The list of items was prioritized most important to least. Each patient would come up with their own list, then work together to create a new list of 15 items while in a group of 3-5 peers. LRT discussed each person's list and how it differed from others. The debrief included discussion of priorities, good decisions versus bad decisions, and how it is important to think before acting so we can make the best decision possible. LRT tied the concept of effective communication among group members to patient's support systems outside of the hospital and its benefit post discharge.  Education: Pharmacist, community, Priorities, Support System, Discharge Planning   Education Outcome: Acknowledges education/In group clarification/Needs additional education   Affect/Mood: Appropriate   Participation Level: Minimal   Participation Quality: Independent   Behavior: Apprehensive    Speech/Thought Process: Irrational   Insight: Lacking   Judgement: Lacking    Modes of Intervention: Group work   Patient Response to Interventions:  Challenging    Education Outcome:  In group clarification offered     Clinical Observations/Individualized Feedback: Pt was confused and unable to follow the instructions of the activity. Pt left and didn't return.      Plan: Continue to engage patient in RT group sessions 2-3x/week.   Ryn Peine-McCall, LRT,CTRS  04/19/2024 12:30 PM

## 2024-04-19 NOTE — Group Note (Signed)
 Date:  04/19/2024 Time:  8:23 PM  Group Topic/Focus:  Wrap-Up Group:   The focus of this group is to help patients review their daily goal of treatment and discuss progress on daily workbooks.    Participation Level:  Active  Participation Quality:  Appropriate  Affect:  Appropriate  Cognitive:  Appropriate  Insight: Appropriate  Engagement in Group:  Engaged  Modes of Intervention:  Education and Exploration  Additional Comments:  Patient attended and participated in group tonight. She reports that her goal was to get exercise which she did.  Allison Bolton 04/19/2024, 8:23 PM

## 2024-04-19 NOTE — Progress Notes (Signed)
 Type of Note:  IVC Court Hearing     Patient participated in the court hearing.   The IVC has been approved for an additional 14 days.  Judge Tabatha P. Holliday presiding.     Allison Bolton 04/19/2024

## 2024-04-19 NOTE — Progress Notes (Signed)
(  Sleep Hours) -16.25 (Any PRNs that were needed, meds refused, or side effects to meds)- none (Any disturbances and when (visitation, over night)-none (Concerns raised by the patient)- none (SI/HI/AVH)-denied

## 2024-04-19 NOTE — Plan of Care (Signed)
 Patient denies SI, AH, VH. Patient stated they slept good last night. Scored zero on anxiety, feeling of hopelessness, and depression. Patient has been cooperative and med compliant.    Problem: Education: Goal: Knowledge of [facility] Education information/materials will improve Outcome: Progressing Goal: Emotional status will improve Outcome: Progressing Goal: Mental status will improve Outcome: Progressing   Problem: Activity: Goal: Interest or engagement in activities will improve Outcome: Progressing

## 2024-04-19 NOTE — Group Note (Signed)
 Date:  04/19/2024 Time:  6:36 PM  Group Topic/Focus:  Self Care:   The focus of this group is to help patients understand the importance of self-care in order to improve or restore emotional, physical, spiritual, interpersonal, and financial health.    Participation Level:  Active  Participation Quality:  Attentive  Affect:  Appropriate  Cognitive:  Alert  Insight: Appropriate  Engagement in Group:  Engaged  Modes of Intervention:  Discussion  Additional Comments:  Patient was engaged in group about Self-Soothing.  Allison Bolton Kratos Ruscitti 04/19/2024, 6:36 PM

## 2024-04-19 NOTE — Plan of Care (Signed)

## 2024-04-19 NOTE — Progress Notes (Signed)
 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 The direct patient care time included face-to-face time with the patient, reviewing the patient's chart, communicating with other professionals, and coordinating care.   Identifying Information and brief psychiatric history:  Allison Bolton is a 53 yr old female with working psychiatric diagnoses of bipolar 1 disorder and major neurocognitive disorder. 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 She does have a history of prior psychiatric hospitalizations with substance use driving psychotic symptoms, however on this admission has presented with clear manic and psychotic symptoms that cleared with time and medications, however underlying neurocognitive deficits have remained. She has one prior suicide Attempt (2007), and Prior Psychiatric Hospitalizations (last- [facility] 01/2024).  On this admission she presented to [facility] ED on 02/17/24  with a chief complaint of anxiety. In the ED, she was found to be acutely psychotic with tangential speech, agitation, and delusional thought content. Patient was admitted involuntarily to [facility] on 8/14. Since admission the patient has been notably unresponsive to antipsychotic medications, largely related to poor self-care appearing disheveled and requiring direction to do ADLs. Although initially this was concerning for lingering psychotic symptoms, several OT evaluations have noted significant cognitive deficits  and poor self care is now thought to be related to major neurocognitive disorder.   Interval events:  Patient's urinalysis noted resistance to bactrim  and she was changed from this to Cipro x 10 days for UTI. Otherwise calm, compliant with medications and other aspects of care. Remains with notable cognitive deficits and childlike quality. Slept 16 hrs overnight.   Interview today:  Today the patient states she is fine. She is wanting to go back to sleep for a nap. Otherwise denies all concerns including SI, HI or AVH.    Past Medical History:  Past Medical History:  Diagnosis Date   Anxiety    Arthritis    Asthma    Bipolar 1 disorder (HCC)    Diverticulosis    Dysrhythmia    sts I have heart palpitations   Fibromyalgia    H/O hiatal hernia    4   Headache(784.0)    High blood pressure    Meniere's disease    S/P colonoscopy    Dr. Golda 2010: few small diverticula at sigmoid. otherwise normal.    Shortness of breath     Past Surgical History:  Procedure Laterality Date   ABDOMINAL HYSTERECTOMY     APPENDECTOMY     CESAREAN SECTION     CHOLECYSTECTOMY     COLONOSCOPY N/A 12/25/2023   Procedure: COLONOSCOPY;  Surgeon: Eartha Angelia Sieving, MD;  Location: [facility] ENDO [facility];  Service: Gastroenterology;  Laterality: N/A;  11:30am, asa 1   exploratory laparoscopy     FOOT SURGERY     HEMORRHOID SURGERY     LAPAROSCOPIC APPENDECTOMY  02/19/2011   Procedure: APPENDECTOMY LAPAROSCOPIC;  Surgeon: Oneil DELENA Budge;  Location: [facility] ORS;  Service: General;  Laterality: N/A;   LAPAROSCOPY  02/19/2011   Procedure: LAPAROSCOPY DIAGNOSTIC;  Surgeon: Oneil DELENA Budge;  Location: [facility] ORS;  Service: General;  Laterality: N/A;   left ovarian removal     multiple hernia repairs     Right ovarian removal     TONSILLECTOMY     TONSILLECTOMY AND ADENOIDECTOMY     tubes in ears     UMBILICAL HERNIA REPAIR  Dec 2011   Dr. Mavis   wisdom teeth removal     Family History:  Family History   Problem Relation Age of Onset   Thyroid  disease Mother    Colon cancer Father    Breast cancer Maternal Aunt    Colon cancer Brother    Colon cancer Other        paternal and maternal grandfather   Meniere's disease Other    Anesthesia problems Neg Hx    Hypotension Neg Hx    Malignant hyperthermia Neg Hx    Pseudochol deficiency Neg Hx    Family Psychiatric  History:  None Reported   Social History:  Social History   Substance and Sexual Activity  Alcohol Use No   Comment: not since June 2012     Social History   Substance and Sexual Activity  Drug Use Yes   Types: Marijuana   Comment: patient denies any-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    Social History   Socioeconomic History   Marital status: Legally Separated    Spouse name: Not on file   Number of children: Not on file   Years of education: Not on file   Highest education level: Not on file  Occupational History   Not on file  Tobacco Use   Smoking status: Every Day    Current packs/day: 0.00    Average packs/day: 0.5 packs/day for 37.3 years (18.7 ttl pk-yrs)    Types: Cigarettes    Start date: 07/07/1981    Last attempt to quit: 11/05/2018    Years since quitting: 5.4   Smokeless tobacco: Never  Vaping Use   Vaping status: Never Used  Substance and Sexual Activity   Alcohol use: No    Comment: not since June 2012   Drug use: Yes    Types: Marijuana    Comment: patient denies any-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   Sexual activity: Never    Birth control/protection: Surgical  Other Topics Concern   Not on file  Social History Narrative   Not on file   Social Drivers of Health   Financial Resource Strain: Not on file  Food Insecurity: Food Insecurity Present (02/18/2024)   Hunger Vital Sign    Worried About Running Out of Food in the Last Year: Sometimes true    Ran Out of Food in the Last Year: Sometimes true  Transportation Needs: Unmet Transportation Needs (02/18/2024)    PRAPARE - Administrator, Civil Service (Medical): Yes    Lack of Transportation (Non-Medical): Yes  Physical Activity: Not on file  Stress: Not on file  Social Connections: Unknown (10/14/2021)   Received from [facility]   Social Connections    Do your friends and family support you?: Not on file    What agencies support you?: Not on file    Current Medications: Current Facility-Administered Medications  Medication Dose Route Frequency Provider Last Rate Last Admin   acetaminophen  (TYLENOL ) tablet 650 mg  650 mg Oral Q6H PRN White, Patrice L, NP   650 mg at 04/15/24 2040   alum & mag hydroxide-simeth (  MAALOX/MYLANTA) 200-200-20 MG/5ML suspension 30 mL  30 mL Oral Q4H PRN White, Patrice L, NP   30 mL at 03/24/24 0217   benztropine  (COGENTIN ) tablet 1 mg  1 mg Oral BID White, Patrice L, NP   1 mg at 04/19/24 0859   ciprofloxacin (CIPRO) tablet 500 mg  500 mg Oral BID Pashayan, Alexander S, DO   500 mg at 04/19/24 0900   divalproex  (DEPAKOTE ) DR tablet 750 mg  750 mg Oral Q12H Ji, Andrew, MD   750 mg at 04/19/24 9140   haloperidol  (HALDOL ) tablet 10 mg  10 mg Oral Q12H Ji, Andrew, MD   10 mg at 04/19/24 9140   hydrOXYzine  (ATARAX ) tablet 25 mg  25 mg Oral TID PRN White, Patrice L, NP   25 mg at 04/18/24 1408   magnesium  hydroxide (MILK OF MAGNESIA) suspension 30 mL  30 mL Oral Daily PRN White, Patrice L, NP       nicotine  polacrilex (NICORETTE ) gum 2 mg  2 mg Oral PRN Trudy Carwin, NP   2 mg at 03/28/24 1716   OLANZapine  (ZYPREXA ) injection 10 mg  10 mg Intramuscular TID PRN White, Patrice L, NP       OLANZapine  (ZYPREXA ) injection 5 mg  5 mg Intramuscular TID PRN White, Patrice L, NP       OLANZapine  zydis (ZYPREXA ) disintegrating tablet 5 mg  5 mg Oral TID PRN White, Patrice L, NP   5 mg at 04/18/24 0103   ondansetron  (ZOFRAN -ODT) disintegrating tablet 4 mg  4 mg Oral Q8H PRN Lynnette Barter, MD   4 mg at 04/02/24 1946   traZODone  (DESYREL ) tablet 50 mg  50 mg Oral  QHS White, Patrice L, NP   50 mg at 04/18/24 2105   ziprasidone  (GEODON ) capsule 60 mg  60 mg Oral BID WC Marry Clamp, MD   60 mg at 04/19/24 9140    Lab Results:  No results found for this or any previous visit (from the past 48 hours).   Blood Alcohol level:  Lab Results  Component Value Date   [facility] <15 02/17/2024   ETH <15 01/14/2024    Metabolic Disorder Labs: Lab Results  Component Value Date   HGBA1C 5.1 01/18/2024   MPG 99.67 01/18/2024   MPG 105.41 05/11/2019   Lab Results  Component Value Date   PROLACTIN 24.1 (H) 05/11/2019   Lab Results  Component Value Date   CHOL 154 01/18/2024   TRIG 180 (H) 01/18/2024   HDL 49 01/18/2024   CHOLHDL 3.1 01/18/2024   VLDL 36 01/18/2024   LDLCALC 69 01/18/2024   LDLCALC 82 05/11/2019    Mental Status exam: Appearance: white female of slightly elevated BMI, fair hygiene, poor dentition seen laying in bed  Eye contact: good  Attitude towards examiner  cooperative, pleasant Psychomotor: no agitation or retardation Speech: reduced amount, infrequent one-word responses  Language: simplistic  Mood: good Affect: was euthymic, smiling  Thought content: denying SI, HI, not overtly expressing delusional content Thought Process: remains confused and concrete, childlike  Perception: denying AVH, not overtly RTIS Insight: poor  Judgement: poor   Orientation: to self and hospital only - not able to accurately report date, situation Attention/Concentration: limited due to cognitive deficits   Memory/Cognition: poor - patient's most recent MOCA on 9/17 was an 8 with significant impairments in attention and memory.  Fund of Knowledge: below average     Musculoskeletal: Strength & Muscle Tone: within normal limits Gait & Station: normal Patient  leans: N/A    Physical Exam Constitutional:      Appearance: the patient is not toxic-appearing.  Pulmonary:     Effort: Pulmonary effort is normal.  Neurological:      General: No focal deficit present.     Oriented to self and hospital only, does not verbalize date or situation HEENT:     Eyes EOMI   Review of Systems  Respiratory:  Negative for shortness of breath.   Cardiovascular:  Negative for chest pain.  Gastrointestinal:  Negative for abdominal pain, constipation, diarrhea, nausea and vomiting.  Neurological:  Negative for headaches.   Blood pressure (!) 134/115, pulse 77, temperature (!) 97.3 F (36.3 C), temperature source Oral, resp. rate 16, height 5' 6 (1.676 m), weight 80.7 kg, SpO2 100%. Body mass index is 28.71 kg/m.   Treatment Plan Summary: Daily contact with patient to assess and evaluate symptoms and progress in treatment and Medication management  Assessment HEYDY MONTILLA is a 53 yr old female who presented on 02/17/24 to [facility] ED with a chief complaint of anxiety. In the ED, she was found to be acutely psychotic with tangential speech, agitation, and delusional thought content. Patient was admitted involuntarily to [facility] on 8/14.  Although substance-induced psychosis has previously been documented, she additional presents with prior reported manic episodes and a bipolar 1 diagnosis has been most reflective of this. On this admission, presented with agitation and increased rate of speech, initially had significantly poor sleep and psychotic symptoms, disorganization of thoughts, behavior, concern for AVH.  After approximately 1 week manic symptoms had largely resolved with antipsychotic medications and mood stabilizers however she remained with disorganization in thoughts and behavior and poor self care initially concerning for ongoing psychosis but after extended observation appears more in line with underlying major neurocognitive disorder due to multiple etiologies. She has significant cognitive impairments with most recent MOCA scoring at 8 and recommendation for 24 hr supervision and help/prompting ADLs. She has been noted  to be childlike in her interactions and requires direction to eat and shower but is otherwise pleasant and cooperative with care with no overt psychotic symptoms. Suspect multifactorial causes of cognitive impairment including longstanding substance use, recurrent manic/psychotic episodes, possible underlying low functioning (unknown).   Over the last 3-4 weeks the patient has been pleasant, denying AVH, somewhat more engaged in exam and with better eye contact. She has not been seen to be RTIS or overtly expressing delusional content. She remains psychiatrically stable. Presentation remains reflective of treated bipolar disorder and ongoing neurocognitive impairments. Patient has been accepted for placement pending bed placement, anticipate discharge approximately 10/20    DSM-5 diagnoses: Bipolar 1 Disorder, most recent episode manic, severe, with psychotic features    -currently resolved: no active symptoms of mania or psychosis, just cognitive deficits 2.  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 History of substance induced psychotic disorder   Plan: Legal status: IVC - renewed 9/30 for an additional 14 days  Psychiatric medications:  -Continue Haldol  10 mg q12 for psychosis and mood stability -Continue Geodon  60 mg bid -Continue Depakote  DR 750 mg q12 for mood stability -Continue Cogentin  1 mg BID for Drug Induced EPS -Continue Agitation Protocol: Zyprexa    Nicotine  Dependence: -Continue Nicotine  Gum 2 mg PRN  Medical concerns: -UTI with U/A noted resistance to bactrim  -Continue Cipro 500 mg BID x 10 days   -initiated 10/13  Additional PRNs -Continue Zofran  4 mg q8 PRN nausea/vomiting -Continue PRN's: Tylenol , Maalox, Atarax ,  Milk of Magnesia, Trazodone    --  The risks/benefits/side-effects/alternatives to medications were discussed in detail with the patient and time was given for questions. The patient consents to medication trials.                 -- Metabolic profile and EKG monitoring obtained while on an atypical antipsychotic (BMI: 28.73 Lipid Panel: WNL except Trig: 180 HbgA1c: 5.1)              -- Encouraged patient to participate in unit milieu and in scheduled group therapies              -- Short Term Goals: Ability to identify changes in lifestyle to reduce recurrence of condition will improve, Ability to verbalize feelings will improve, Ability to disclose and discuss suicidal ideas, Ability to demonstrate self-control will improve, Ability to identify and develop effective coping behaviors will improve, Ability to maintain clinical measurements within normal limits will improve, Compliance with prescribed medications will improve, and Ability to identify triggers associated with substance abuse/mental health issues will improve             -- Long Term Goals: Improvement in symptoms so as ready for discharge   Safety and Monitoring:             -- Involuntary admission to inpatient psychiatric unit for safety, stabilization and treatment             -- Daily contact with patient to assess and evaluate symptoms and progress in treatment             -- Patient's case to be discussed in multi-disciplinary team meeting             -- Observation Level : q15 minute checks             -- Vital signs:  q12 hours             -- Precautions: suicide, elopement, and assault  Discharge Planning:             -appreciate SW assistance in discharging  [facility] SW now involved and will be getting the patient a guardian and assisting in finding placement. Level 2 filled out and guardianship hearing was on 10/7 - she now has guardian.   -patient accepted into placement; awaiting bed availability   Leita LOISE Arts, MD 04/19/2024, 9:14 AM

## 2024-04-20 ENCOUNTER — Encounter (HOSPITAL_COMMUNITY): Payer: Self-pay

## 2024-04-20 ENCOUNTER — Encounter (INDEPENDENT_AMBULATORY_CARE_PROVIDER_SITE_OTHER): Payer: Self-pay | Admitting: Gastroenterology

## 2024-04-20 DIAGNOSIS — F1990 Other psychoactive substance use, unspecified, uncomplicated: Secondary | ICD-10-CM | POA: Diagnosis not present

## 2024-04-20 DIAGNOSIS — F0280 Dementia in other diseases classified elsewhere without behavioral disturbance: Secondary | ICD-10-CM | POA: Diagnosis not present

## 2024-04-20 DIAGNOSIS — F312 Bipolar disorder, current episode manic severe with psychotic features: Secondary | ICD-10-CM | POA: Diagnosis not present

## 2024-04-20 NOTE — Progress Notes (Signed)
(  Sleep Hours) -11.5 (Any PRNs that were needed, meds refused, or side effects to meds)-Tylenol   (Any disturbances and when (visitation, over night)-none (Concerns raised by the patient)- none  (SI/HI/AVH)-denied

## 2024-04-20 NOTE — Group Note (Signed)
 Date:  04/20/2024 Time:  8:24 PM  Group Topic/Focus:  Wrap-Up Group:   The focus of this group is to help patients review their daily goal of treatment and discuss progress on daily workbooks.    Participation Level:  Active  Participation Quality:  Appropriate  Affect:  Appropriate  Cognitive:  Appropriate  Insight: Lacking  Engagement in Group:  Engaged  Modes of Intervention:  Education and Exploration  Additional Comments:  Patient attended and participated in group tonight. She reports that today she watched television, spoke with her Doctor and went for her meals.  Gwenn Chillington Dacosta 04/20/2024, 8:24 PM

## 2024-04-20 NOTE — Progress Notes (Signed)
 [facility] MD Progress Note  04/20/2024 12:32 PM Allison Bolton  MRN:  [id number]  Principal Problem: Bipolar affective disorder, most recent episode manic with psychotic symptoms (HCC)     Major Neurocognitive Disorder due to multiple etiologies  Diagnosis: Principal Problem:   Bipolar affective disorder, current episode manic with psychotic symptoms (HCC)  Major Neurocognitive Disorder due to multiple etiologies    Total Time spent with patient:  I personally spent 25 minutes on the unit in direct patient care. The direct patient care time included face-to-face time with the patient, reviewing the patient's chart, communicating with other professionals, and coordinating care.   Identifying Information and brief psychiatric history:  Allison Bolton is a 53 yr old female with working psychiatric diagnoses of bipolar 1 disorder and major neurocognitive disorder. 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 She does have a history of prior psychiatric hospitalizations with substance use driving psychotic symptoms, however on this admission has presented with clear manic and psychotic symptoms that cleared with time and medications, however underlying neurocognitive deficits have remained. She has one prior suicide Attempt (2007), and Prior Psychiatric Hospitalizations (last- [facility] 01/2024).  On this admission she presented to [facility] ED on 02/17/24  with a chief complaint of anxiety. In the ED, she was found to be acutely psychotic with tangential speech, agitation, and delusional thought content. Patient was admitted involuntarily to [facility] on 8/14. Since admission the patient has been notably unresponsive to antipsychotic medications, largely related to poor self-care appearing disheveled and requiring direction to do ADLs. Although initially this was concerning for lingering psychotic symptoms, several OT evaluations have noted significant cognitive deficits  and poor self care is now thought to be related to major neurocognitive disorder.   Interval events:  Patient has been attending groups with limited participation, remains compliant with care including medications. No behavioral concerns or PRNs for agitation needed. Vitals: BP 94/76 (BP Location: Right Arm)   Pulse 80   Temp 97.6 F (36.4 C) (Oral)   Resp 16   Ht 5' 6 (1.676 m)   Wt 80.7 kg   SpO2 (!) 84%   BMI 28.71 kg/m    Interview today:  Today the patient states she is good! Reporting good sleep, appetite and energy. Denying all concerns including SI, HI and AVH.   Past Medical History:  Past Medical History:  Diagnosis Date   Anxiety    Arthritis    Asthma    Bipolar 1 disorder (HCC)    Diverticulosis    Dysrhythmia    sts I have heart palpitations   Fibromyalgia    H/O hiatal hernia    4   Headache(784.0)    High blood pressure    Meniere's disease    S/P colonoscopy    Dr. Golda 2010: few small diverticula at sigmoid. otherwise normal.    Shortness of breath     Past Surgical History:  Procedure Laterality Date   ABDOMINAL HYSTERECTOMY     APPENDECTOMY     CESAREAN SECTION     CHOLECYSTECTOMY     COLONOSCOPY N/A 12/25/2023   Procedure: COLONOSCOPY;  Surgeon: Eartha Angelia Sieving, MD;  Location: [facility];  Service: Gastroenterology;  Laterality: N/A;  11:30am, asa 1   exploratory laparoscopy     FOOT SURGERY     HEMORRHOID SURGERY     LAPAROSCOPIC APPENDECTOMY  02/19/2011   Procedure: APPENDECTOMY LAPAROSCOPIC;  Surgeon: Oneil DELENA Budge;  Location: [facility] ORS;  Service: General;  Laterality: N/A;   LAPAROSCOPY  02/19/2011   Procedure: LAPAROSCOPY DIAGNOSTIC;  Surgeon: Oneil DELENA Budge;  Location: [facility];  Service: General;  Laterality: N/A;   left ovarian removal     multiple hernia repairs     Right ovarian removal     TONSILLECTOMY     TONSILLECTOMY AND ADENOIDECTOMY     tubes in ears     UMBILICAL HERNIA REPAIR  Dec 2011   Dr. Budge    wisdom teeth removal     Family History:  Family History  Problem Relation Age of Onset   Thyroid  disease Mother    Colon cancer Father    Breast cancer Maternal Aunt    Colon cancer Brother    Colon cancer Other        paternal and maternal grandfather   Meniere's disease Other    Anesthesia problems Neg Hx    Hypotension Neg Hx    Malignant hyperthermia Neg Hx    Pseudochol deficiency Neg Hx    Family Psychiatric  History:  None Reported   Social History:  Social History   Substance and Sexual Activity  Alcohol Use No   Comment: not since June 2012     Social History   Substance and Sexual Activity  Drug Use Yes   Types: Marijuana   Comment: patient denies any-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    Social History   Socioeconomic History   Marital status: Legally Separated    Spouse name: Not on file   Number of children: Not on file   Years of education: Not on file   Highest education level: Not on file  Occupational History   Not on file  Tobacco Use   Smoking status: Every Day    Current packs/day: 0.00    Average packs/day: 0.5 packs/day for 37.3 years (18.7 ttl pk-yrs)    Types: Cigarettes    Start date: 07/07/1981    Last attempt to quit: 11/05/2018    Years since quitting: 5.4   Smokeless tobacco: Never  Vaping Use   Vaping status: Never Used  Substance and Sexual Activity   Alcohol use: No    Comment: not since June 2012   Drug use: Yes    Types: Marijuana    Comment: patient denies any-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   Sexual activity: Never    Birth control/protection: Surgical  Other Topics Concern   Not on file  Social History Narrative   Not on file   Social Drivers of Health   Financial Resource Strain: Not on file  Food Insecurity: Food Insecurity Present (02/18/2024)   Hunger Vital Sign    Worried About Running Out of Food in the Last Year: Sometimes true    Ran Out of Food in the Last Year: Sometimes true   Transportation Needs: Unmet Transportation Needs (02/18/2024)   PRAPARE - Administrator, Civil Service (Medical): Yes    Lack of Transportation (Non-Medical): Yes  Physical Activity: Not on file  Stress: Not on file  Social Connections: Unknown (10/14/2021)   Received from [facility]   Social Connections    Do your friends and family support you?: Not on file    What agencies support you?: Not on file    Current Medications: Current Facility-Administered Medications  Medication Dose Route Frequency Provider Last Rate Last Admin   acetaminophen  (TYLENOL ) tablet 650 mg  650  mg Oral Q6H PRN White, Patrice L, NP   650 mg at 04/19/24 2109   alum & mag hydroxide-simeth (MAALOX/MYLANTA) 200-200-20 MG/5ML suspension 30 mL  30 mL Oral Q4H PRN White, Patrice L, NP   30 mL at 03/24/24 0217   benztropine  (COGENTIN ) tablet 1 mg  1 mg Oral BID White, Patrice L, NP   1 mg at 04/20/24 9192   ciprofloxacin (CIPRO) tablet 500 mg  500 mg Oral BID Pashayan, Alexander S, DO   500 mg at 04/20/24 9192   divalproex  (DEPAKOTE ) DR tablet 750 mg  750 mg Oral Q12H Ji, Andrew, MD   750 mg at 04/20/24 9192   haloperidol  (HALDOL ) tablet 10 mg  10 mg Oral Q12H Ji, Andrew, MD   10 mg at 04/20/24 9192   hydrOXYzine  (ATARAX ) tablet 25 mg  25 mg Oral TID PRN White, Patrice L, NP   25 mg at 04/18/24 1408   magnesium  hydroxide (MILK OF MAGNESIA) suspension 30 mL  30 mL Oral Daily PRN White, Patrice L, NP       nicotine  polacrilex (NICORETTE ) gum 2 mg  2 mg Oral PRN Trudy Carwin, NP   2 mg at 04/19/24 1956   OLANZapine  (ZYPREXA ) injection 10 mg  10 mg Intramuscular TID PRN White, Patrice L, NP       OLANZapine  (ZYPREXA ) injection 5 mg  5 mg Intramuscular TID PRN White, Patrice L, NP       OLANZapine  zydis (ZYPREXA ) disintegrating tablet 5 mg  5 mg Oral TID PRN White, Patrice L, NP   5 mg at 04/18/24 0103   ondansetron  (ZOFRAN -ODT) disintegrating tablet 4 mg  4 mg Oral Q8H PRN Lynnette Barter, MD   4  mg at 04/02/24 1946   traZODone  (DESYREL ) tablet 50 mg  50 mg Oral QHS White, Patrice L, NP   50 mg at 04/19/24 2044   ziprasidone  (GEODON ) capsule 60 mg  60 mg Oral BID WC Marry Clamp, MD   60 mg at 04/20/24 9192    Lab Results:  No results found for this or any previous visit (from the past 48 hours).   Blood Alcohol level:  Lab Results  Component Value Date   [facility] <15 02/17/2024   ETH <15 01/14/2024    Metabolic Disorder Labs: Lab Results  Component Value Date   HGBA1C 5.1 01/18/2024   MPG 99.67 01/18/2024   MPG 105.41 05/11/2019   Lab Results  Component Value Date   PROLACTIN 24.1 (H) 05/11/2019   Lab Results  Component Value Date   CHOL 154 01/18/2024   TRIG 180 (H) 01/18/2024   HDL 49 01/18/2024   CHOLHDL 3.1 01/18/2024   VLDL 36 01/18/2024   LDLCALC 69 01/18/2024   LDLCALC 82 05/11/2019    Mental Status exam: Appearance: white female of slightly elevated BMI, fair hygiene, poor dentition seen walking in milieu Eye contact: good  Attitude towards examiner  cooperative, pleasant Psychomotor: no agitation or retardation Speech: reduced amount, infrequent one-word responses  Language: simplistic  Mood: good! Affect: euthymic, smiling  Thought content: denying SI, HI, not overtly expressing delusional content Thought Process: remains confused and concrete, childlike  Perception: denying AVH, not overtly RTIS Insight: poor  Judgement: poor   Orientation: to self and hospital only - not able to accurately report date, situation Attention/Concentration: limited due to cognitive deficits   Memory/Cognition: poor - patient's most recent MOCA on 9/17 was an 8 with significant impairments in attention and memory.  Fund of Knowledge:  below average     Musculoskeletal: Strength & Muscle Tone: within normal limits Gait & Station: normal Patient leans: N/A    Physical Exam Constitutional:      Appearance: the patient is not toxic-appearing.  Pulmonary:      Effort: Pulmonary effort is normal.  Neurological:     General: No focal deficit present.     Oriented to self and hospital only, does not verbalize date or situation HEENT:     Eyes EOMI   Review of Systems  Respiratory:  Negative for shortness of breath.   Cardiovascular:  Negative for chest pain.  Gastrointestinal:  Negative for abdominal pain, constipation, diarrhea, nausea and vomiting.  Neurological:  Negative for headaches.   Blood pressure 94/76, pulse 80, temperature 97.6 F (36.4 C), temperature source Oral, resp. rate 16, height 5' 6 (1.676 m), weight 80.7 kg, SpO2 (!) 84%. Body mass index is 28.71 kg/m.   Treatment Plan Summary: Daily contact with patient to assess and evaluate symptoms and progress in treatment and Medication management  Assessment Allison Bolton is a 53 yr old female who presented on 02/17/24 to [facility] ED with a chief complaint of anxiety. In the ED, she was found to be acutely psychotic with tangential speech, agitation, and delusional thought content. Patient was admitted involuntarily to [facility] on 8/14.  Although substance-induced psychosis has previously been documented, she additional presents with prior reported manic episodes and a bipolar 1 diagnosis has been most reflective of this. On this admission, presented with agitation and increased rate of speech, initially had significantly poor sleep and psychotic symptoms, disorganization of thoughts, behavior, concern for AVH.  After approximately 1 week manic symptoms had largely resolved with antipsychotic medications and mood stabilizers however she remained with disorganization in thoughts and behavior and poor self care initially concerning for ongoing psychosis but after extended observation appears more in line with underlying major neurocognitive disorder due to multiple etiologies. She has significant cognitive impairments with most recent MOCA scoring at 8 and recommendation for 24 hr  supervision and help/prompting ADLs. She has been noted to be childlike in her interactions and requires direction to eat and shower but is otherwise pleasant and cooperative with care with no overt psychotic symptoms. Suspect multifactorial causes of cognitive impairment including longstanding substance use, recurrent manic/psychotic episodes, possible underlying low functioning (unknown).   Over the last 3-4 weeks the patient has been pleasant, denying AVH, somewhat more engaged in exam and with better eye contact. She has not been seen to be RTIS or overtly expressing delusional content. She remains psychiatrically stable. Presentation remains reflective of treated bipolar disorder and ongoing neurocognitive impairments. Patient has been accepted for placement pending bed placement, anticipate discharge approximately 10/20    DSM-5 diagnoses: Bipolar 1 Disorder, most recent episode manic, severe, with psychotic features    -currently resolved: no active symptoms of mania or psychosis, just cognitive deficits 2.  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 History of substance induced psychotic disorder   Plan: Legal status: IVC - renewed 9/30 for an additional 14 days  Psychiatric medications:  -Continue Haldol  10 mg q12 for psychosis and mood stability -Continue Geodon  60 mg bid -Continue Depakote  DR 750 mg q12 for mood stability -Continue Cogentin  1 mg BID for Drug Induced EPS -Continue Agitation Protocol: Zyprexa    Nicotine  Dependence: -Continue Nicotine  Gum 2 mg PRN  Medical concerns: -UTI with U/A noted resistance to bactrim  -Continue Cipro 500 mg BID x 10 days   -  initiated 10/13  Additional PRNs -Continue Zofran  4 mg q8 PRN nausea/vomiting -Continue PRN's: Tylenol , Maalox, Atarax , Milk of Magnesia, Trazodone    --  The risks/benefits/side-effects/alternatives to medications were discussed in detail with the patient and time was given for questions. The  patient consents to medication trials.                -- Metabolic profile and EKG monitoring obtained while on an atypical antipsychotic (BMI: 28.73 Lipid Panel: WNL except Trig: 180 HbgA1c: 5.1)              -- Encouraged patient to participate in unit milieu and in scheduled group therapies              -- Short Term Goals: Ability to identify changes in lifestyle to reduce recurrence of condition will improve, Ability to verbalize feelings will improve, Ability to disclose and discuss suicidal ideas, Ability to demonstrate self-control will improve, Ability to identify and develop effective coping behaviors will improve, Ability to maintain clinical measurements within normal limits will improve, Compliance with prescribed medications will improve, and Ability to identify triggers associated with substance abuse/mental health issues will improve             -- Long Term Goals: Improvement in symptoms so as ready for discharge   Safety and Monitoring:             -- Involuntary admission to inpatient psychiatric unit for safety, stabilization and treatment             -- Daily contact with patient to assess and evaluate symptoms and progress in treatment             -- Patient's case to be discussed in multi-disciplinary team meeting             -- Observation Level : q15 minute checks             -- Vital signs:  q12 hours             -- Precautions: suicide, elopement, and assault  Discharge Planning:             -appreciate SW assistance in discharging  [facility] SW now involved and will be getting the patient a guardian and assisting in finding placement. Level 2 filled out and guardianship hearing was on 10/7 - she now has guardian.   -patient accepted into placement; awaiting bed availability   Leita LOISE Arts, MD 04/20/2024, 12:32 PM

## 2024-04-20 NOTE — Progress Notes (Signed)
 [facility] [place], [place]) [phone]   Donzell said that she is unsure if bed for patient will be available because legal guardian for the person that was supposed to move out, doesn't have a bed available for the other patient.  Donzell said that she would like patient come to the group home because patient would get along with other client in the room, and Donzell still working on it.    Donzell offered a bed in another unlicensed home.  Patient would be the only patient in that home, and staff would be there, however patient's legal guardian didn't give permission to place patient in an unlicensed home.    Collateral contact - DSS Adult Protective Services Social Worker Izetta Roses) [phone]    Legal guardian said that patient can't go to unlicensed home.  She asked CSW to partially complete 2 applications for group homes, and she will finish and sign them.   Joycelyn Liska, LCSWA 04/19/2024

## 2024-04-20 NOTE — Progress Notes (Addendum)
 Collateral contact - DSS Adult Protective Services Social Worker Katie Rutherford) [phone], [email or web address]   CSW emailed partially completed applications to legal guardian for [facility] and [facility] via secure email.  CSW left a voicemail for Izetta Roses (DSS Adult [employer] Social Worker) [phone] stating that she emailed her partially completed applications.   [facility] [place], [place]) [phone]  Donzell said that she is still working on securing a bed for patient.     Kaleisha Bhargava, LCSWA 04/20/2024

## 2024-04-20 NOTE — Plan of Care (Signed)

## 2024-04-20 NOTE — Group Note (Signed)
 Recreation Therapy Group Note   Group Topic:Problem Solving  Group Date: 04/20/2024 Start Time: 1020 End Time: 1040 Facilitators: Melanie Openshaw-McCall, LRT,CTRS Location: [address] Dayroom   Group Topic: Communication, Team Building, Problem Solving   Goal Area(s) Addresses:  Patient will effectively work with peer towards shared goal.  Patient will identify skills used to make activity successful.  Patient will identify how skills used during activity can be used to reach post d/c goals.    Behavioral Response:    Intervention: STEM Activity   Activity: Landing Pad. In teams of 3-5, patients were given 12 plastic drinking straws and an equal length of masking tape. Using the materials provided, patients were asked to build a landing pad to catch a golf ball dropped from approximately 5 feet in the air. All materials were required to be used by the team in their design. LRT facilitated post-activity discussion.   Education: Pharmacist, community, Scientist, physiological, Discharge Planning    Education Outcome: Acknowledges education/In group clarification offered/Needs additional education.    Clinical Observations/Individualized Feedback: Due to acuity on the unit, recreation therapy group was not held this morning.      Plan: Continue to engage patient in RT group sessions 2-3x/week.   Lexani Corona-McCall, LRT,CTRS 04/20/2024 12:10 PM

## 2024-04-20 NOTE — BH IP Treatment Plan (Signed)
 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)   Current Medications:  Current Facility-Administered Medications  Medication Dose Route Frequency Provider Last Rate Last Admin   acetaminophen  (TYLENOL ) tablet 650 mg  650 mg Oral Q6H PRN White, Patrice L, NP   650 mg at 04/19/24 2109   alum & mag hydroxide-simeth (MAALOX/MYLANTA) 200-200-20 MG/5ML suspension 30 mL  30 mL Oral Q4H PRN White, Patrice L, NP   30 mL at 03/24/24 0217   benztropine  (COGENTIN ) tablet 1 mg  1 mg Oral BID White, Patrice L, NP   1 mg at 04/20/24 9192   ciprofloxacin (CIPRO) tablet 500 mg  500 mg Oral BID Pashayan, Alexander S, DO   500 mg at 04/20/24 9192   divalproex  (DEPAKOTE ) DR tablet 750 mg  750 mg Oral Q12H Ji, Andrew, MD   750 mg at 04/20/24 9192   haloperidol  (HALDOL ) tablet 10 mg  10 mg Oral Q12H Ji, Andrew, MD   10 mg at 04/20/24 9192   hydrOXYzine  (ATARAX ) tablet 25 mg  25 mg Oral TID PRN White, Patrice L, NP   25 mg at 04/18/24 1408   magnesium  hydroxide (MILK OF MAGNESIA) suspension 30 mL  30 mL Oral Daily PRN White, Patrice L, NP       nicotine  polacrilex (NICORETTE ) gum 2 mg  2 mg Oral PRN Trudy Carwin, NP   2 mg at 04/19/24 1956   OLANZapine  (ZYPREXA ) injection 10 mg  10 mg Intramuscular TID PRN White, Patrice L, NP       OLANZapine  (ZYPREXA ) injection 5 mg  5 mg Intramuscular TID PRN White, Patrice L, NP       OLANZapine  zydis (ZYPREXA ) disintegrating tablet 5 mg  5 mg Oral TID PRN White, Patrice L, NP   5 mg at 04/18/24 0103   ondansetron  (ZOFRAN -ODT) disintegrating tablet 4 mg  4 mg Oral Q8H PRN Lynnette Barter,  MD   4 mg at 04/02/24 1946   traZODone  (DESYREL ) tablet 50 mg  50 mg Oral QHS White, Patrice L, NP   50 mg at 04/19/24 2044   ziprasidone  (GEODON ) capsule 60 mg  60 mg Oral BID WC Marry Clamp, MD   60 mg at 04/20/24 0807   PTA Medications: Medications Prior to Admission  Medication Sig Dispense Refill Last Dose/Taking   albuterol  (VENTOLIN  HFA) 108 (90 Base) MCG/ACT inhaler Inhale 2 puffs into the lungs every 4 (four) hours as needed for wheezing or shortness of breath.      benztropine  (COGENTIN ) 1 MG tablet Take 1 tablet (1 mg total) by mouth 2 (two) times daily. 60 tablet 0    divalproex  (DEPAKOTE ) 250 MG DR tablet Take 3 tablets (750 mg total) by mouth every 12 (twelve) hours. 180 tablet 0    fluPHENAZine  (PROLIXIN ) 5 MG tablet Take 1 tablet (5 mg total) by mouth 3 (three) times daily. 90 tablet 0    hydrOXYzine  (ATARAX ) 25 MG tablet Take 1 tablet (25 mg total) by mouth 3 (three) times daily as needed for anxiety. 90 tablet 0    nicotine  (NICODERM CQ  - DOSED IN MG/24 HOURS) 14 mg/24hr patch Place 1 patch (14 mg total) onto the skin daily. 28  patch 0    ondansetron  (ZOFRAN -ODT) 4 MG disintegrating tablet Take 4 mg by mouth every 8 (eight) hours as needed for nausea or vomiting.      traZODone  (DESYREL ) 50 MG tablet Take 1 tablet (50 mg total) by mouth at bedtime as needed for sleep. 30 tablet 0     Patient Stressors: Financial difficulties   Substance abuse   Traumatic event    Patient Strengths: Capable of independent living  Contractor  Supportive family/friends   Treatment Modalities: Medication Management, Group therapy, Case management,  1 to 1 session with clinician, Psychoeducation, Recreational therapy.   Physician Treatment Plan for Primary Diagnosis: Bipolar affective disorder, current episode manic with psychotic symptoms (HCC) Long Term Goal(s):     Short Term Goals: Ability to demonstrate self-control will improve Compliance with prescribed medications will  improve Ability to identify triggers associated with substance abuse/mental health issues will improve  Medication Management: Evaluate patient's response, side effects, and tolerance of medication regimen.  Therapeutic Interventions: 1 to 1 sessions, Unit Group sessions and Medication administration.  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  Therapeutic Interventions: 1 to 1 sessions, Unit Group sessions and Medication administration.  Evaluation of Outcomes: Progressing   RN Treatment Plan for Primary Diagnosis: Bipolar affective disorder, current episode manic with psychotic symptoms (HCC) Long Term Goal(s): Knowledge of disease and therapeutic regimen to maintain health will improve  Short Term Goals: Ability to remain free from injury will improve, Ability to verbalize frustration and anger appropriately will improve, Ability to demonstrate self-control, Ability to participate in decision making will improve, and Ability to verbalize feelings will improve  Medication Management: RN will administer medications as ordered by provider, will assess and evaluate patient's response and provide education to patient for prescribed medication. RN will report any adverse and/or side effects to prescribing provider.  Therapeutic Interventions: 1 on 1 counseling sessions, Psychoeducation, Medication administration, Evaluate  responses to treatment, Monitor vital signs and CBGs as ordered, Perform/monitor CIWA, COWS, AIMS and Fall Risk screenings as ordered, Perform wound care treatments as ordered.  Evaluation of Outcomes: Progressing   LCSW Treatment Plan for Primary Diagnosis: Bipolar affective disorder, current episode manic with psychotic symptoms (HCC) Long Term Goal(s): Safe transition to appropriate next level of care at discharge, Engage patient in therapeutic group addressing interpersonal concerns.  Short Term Goals: Engage patient in aftercare planning with referrals and resources, Increase social support, Increase ability to appropriately verbalize feelings, Increase emotional regulation, and Facilitate acceptance of mental health diagnosis and concerns  Therapeutic Interventions: Assess for all discharge needs, 1 to 1 time with Social worker, Explore available resources and support systems, Assess for adequacy in community support network, Educate family and significant other(s) on suicide prevention, Complete Psychosocial Assessment, Interpersonal group therapy.  Evaluation of Outcomes: Progressing   Progress in Treatment: Attending groups: attended some groups Participating in groups:  Yes Taking medication as prescribed: Yes. Toleration medication: Yes. Family/Significant other contact made: Yes, contacted: Winton Sheller (aunt) [phone] and [name] Salisbury, friend, [phone]  Patient understands diagnosis: No. Discussing patient identified problems/goals with staff: No. Medical problems stabilized or resolved: Yes. Denies suicidal/homicidal ideation: Yes. Issues/concerns per patient self-inventory: No.   New problem(s) identified:  No   New Short Term/Long Term Goal(s):      medication stabilization, elimination of SI thoughts, development of comprehensive mental wellness plan.      Patient Goals:  My goal is that [facility] help kids that get  molested.     Discharge Plan or Barriers:  Patient recently admitted. CSW will continue to follow and assess for appropriate referrals and possible discharge planning.      Reason for Continuation of Hospitalization: Anxiety Depression Medication stabilization Substance Use   Estimated Length of Stay:  3 - 4 days  Last 3 [place] Suicide Severity Risk Score: Flowsheet Row [facility] from 02[facility]17/2024 in [facility] Admission (Discharged) from 01/15/2024 in [facility]  C-SSRS RISK CATEGORY No Risk No Risk No Risk    Last PHQ 2/9 Scores:     No data to display          Scribe for Treatment Team: Jaimey Franchini M Taeya Theall, [place] 04/20/2024 1:24 PM

## 2024-04-21 DIAGNOSIS — F312 Bipolar disorder, current episode manic severe with psychotic features: Secondary | ICD-10-CM | POA: Diagnosis not present

## 2024-04-21 DIAGNOSIS — F0280 Dementia in other diseases classified elsewhere without behavioral disturbance: Secondary | ICD-10-CM | POA: Diagnosis not present

## 2024-04-21 NOTE — Progress Notes (Signed)
 [facility] Psychiatric MD Progress Note  04/21/2024 7:37 AM Allison Bolton  MRN:  [id number]  Principal Problem: Bipolar affective disorder, most recent episode manic with psychotic symptoms (HCC)     Major Neurocognitive Disorder due to multiple etiologies  Diagnosis: Principal Problem:   Bipolar affective disorder, current episode manic with psychotic symptoms (HCC)  Major Neurocognitive Disorder due to multiple etiologies    Total Time spent with patient:  I personally spent 25 minutes on the unit in direct patient care. The direct patient care time included face-to-face time with the patient, reviewing the patient's chart, communicating with other professionals, and coordinating care.   Identifying Information and brief psychiatric history:  Allison Bolton is a 53 yr old female with working psychiatric diagnoses of bipolar 1 disorder and major neurocognitive disorder. 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 She does have a history of prior psychiatric hospitalizations with substance use driving psychotic symptoms, however on this admission has presented with clear manic and psychotic symptoms that cleared with time and medications, however underlying neurocognitive deficits have remained. She has one prior suicide Attempt (2007), and Prior Psychiatric Hospitalizations (last- [facility] 01/2024).  On this admission she presented to [facility] ED on 02/17/24  with a chief complaint of anxiety. In the ED, she was found to be acutely psychotic with tangential speech, agitation, and delusional thought content. Patient was admitted involuntarily to [facility] on 8/14. Since admission the patient has been notably unresponsive to antipsychotic medications, largely related to poor self-care appearing disheveled and requiring direction to do ADLs. Although initially this was concerning for lingering psychotic symptoms, several OT evaluations have noted significant cognitive deficits  and poor self care is now thought to be related to major neurocognitive disorder.   Interval events:  Back up group homes being contacted with assistance from DSS APS due to plans falling through for original home. Patient has been compliant with care, attending groups with limited ability to engage. No behavioral concerns. Vitals: BP (!) 140/60 (BP Location: Right Wrist)   Pulse 80   Temp 97.6 F (36.4 C) (Oral)   Resp 16   Ht 5' 6 (1.676 m)   Wt 80.7 kg   SpO2 (!) 84%   BMI 28.71 kg/m    Interview today:  Today the patient reports she is great! Slept well and denying all concerns. When asked if the patient had a new tattoo on her arm (writing of a name and number in pen) the patient states yes! No additional questions or concerns today.    Past Medical History:  Past Medical History:  Diagnosis Date   Anxiety    Arthritis    Asthma    Bipolar 1 disorder (HCC)    Diverticulosis    Dysrhythmia    sts I have heart palpitations   Fibromyalgia    H/O hiatal hernia    4   Headache(784.0)    High blood pressure    Meniere's disease    S/P colonoscopy    Dr. Golda 2010: few small diverticula at sigmoid. otherwise normal.    Shortness of breath     Past Surgical History:  Procedure Laterality Date   ABDOMINAL HYSTERECTOMY     APPENDECTOMY     CESAREAN SECTION     CHOLECYSTECTOMY     COLONOSCOPY N/A 12/25/2023   Procedure: COLONOSCOPY;  Surgeon: Eartha Angelia Sieving, MD;  Location: [facility];  Service: Gastroenterology;  Laterality: N/A;  11:30am, asa 1  exploratory laparoscopy     FOOT SURGERY     HEMORRHOID SURGERY     LAPAROSCOPIC APPENDECTOMY  02/19/2011   Procedure: APPENDECTOMY LAPAROSCOPIC;  Surgeon: Oneil DELENA Budge;  Location: [facility] ORS;  Service: General;  Laterality: N/A;   LAPAROSCOPY  02/19/2011   Procedure: LAPAROSCOPY DIAGNOSTIC;  Surgeon: Oneil DELENA Budge;  Location: [facility] ORS;  Service: General;  Laterality: N/A;   left ovarian removal      multiple hernia repairs     Right ovarian removal     TONSILLECTOMY     TONSILLECTOMY AND ADENOIDECTOMY     tubes in ears     UMBILICAL HERNIA REPAIR  Dec 2011   Dr. Budge   wisdom teeth removal     Family History:  Family History  Problem Relation Age of Onset   Thyroid  disease Mother    Colon cancer Father    Breast cancer Maternal Aunt    Colon cancer Brother    Colon cancer Other        paternal and maternal grandfather   Meniere's disease Other    Anesthesia problems Neg Hx    Hypotension Neg Hx    Malignant hyperthermia Neg Hx    Pseudochol deficiency Neg Hx    Family Psychiatric  History:  None Reported   Social History:  Social History   Substance and Sexual Activity  Alcohol Use No   Comment: not since June 2012     Social History   Substance and Sexual Activity  Drug Use Yes   Types: Marijuana   Comment: patient denies any-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    Social History   Socioeconomic History   Marital status: Legally Separated    Spouse name: Not on file   Number of children: Not on file   Years of education: Not on file   Highest education level: Not on file  Occupational History   Not on file  Tobacco Use   Smoking status: Every Day    Current packs/day: 0.00    Average packs/day: 0.5 packs/day for 37.3 years (18.7 ttl pk-yrs)    Types: Cigarettes    Start date: 07/07/1981    Last attempt to quit: 11/05/2018    Years since quitting: 5.4   Smokeless tobacco: Never  Vaping Use   Vaping status: Never Used  Substance and Sexual Activity   Alcohol use: No    Comment: not since June 2012   Drug use: Yes    Types: Marijuana    Comment: patient denies any-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   Sexual activity: Never    Birth control/protection: Surgical  Other Topics Concern   Not on file  Social History Narrative   Not on file   Social Drivers of Health   Financial Resource Strain: Not on file  Food Insecurity: Food  Insecurity Present (02/18/2024)   Hunger Vital Sign    Worried About Running Out of Food in the Last Year: Sometimes true    Ran Out of Food in the Last Year: Sometimes true  Transportation Needs: Unmet Transportation Needs (02/18/2024)   PRAPARE - Administrator, Civil Service (Medical): Yes    Lack of Transportation (Non-Medical): Yes  Physical Activity: Not on file  Stress: Not on file  Social Connections: Unknown (10/14/2021)   Received from [facility]   Social Connections    Do your friends and family support you?: Not on file  What agencies support you?: Not on file    Current Medications: Current Facility-Administered Medications  Medication Dose Route Frequency Provider Last Rate Last Admin   acetaminophen  (TYLENOL ) tablet 650 mg  650 mg Oral Q6H PRN White, Patrice L, NP   650 mg at 04/20/24 1948   alum & mag hydroxide-simeth (MAALOX/MYLANTA) 200-200-20 MG/5ML suspension 30 mL  30 mL Oral Q4H PRN White, Patrice L, NP   30 mL at 03/24/24 0217   benztropine  (COGENTIN ) tablet 1 mg  1 mg Oral BID White, Patrice L, NP   1 mg at 04/20/24 1651   ciprofloxacin (CIPRO) tablet 500 mg  500 mg Oral BID Pashayan, Alexander S, DO   500 mg at 04/20/24 2032   divalproex  (DEPAKOTE ) DR tablet 750 mg  750 mg Oral Q12H Ji, Andrew, MD   750 mg at 04/20/24 2032   haloperidol  (HALDOL ) tablet 10 mg  10 mg Oral Q12H Ji, Andrew, MD   10 mg at 04/20/24 2032   hydrOXYzine  (ATARAX ) tablet 25 mg  25 mg Oral TID PRN White, Patrice L, NP   25 mg at 04/18/24 1408   magnesium  hydroxide (MILK OF MAGNESIA) suspension 30 mL  30 mL Oral Daily PRN White, Patrice L, NP       nicotine  polacrilex (NICORETTE ) gum 2 mg  2 mg Oral PRN Trudy Carwin, NP   2 mg at 04/19/24 1956   OLANZapine  (ZYPREXA ) injection 10 mg  10 mg Intramuscular TID PRN White, Patrice L, NP       OLANZapine  (ZYPREXA ) injection 5 mg  5 mg Intramuscular TID PRN White, Patrice L, NP       OLANZapine  zydis (ZYPREXA )  disintegrating tablet 5 mg  5 mg Oral TID PRN White, Patrice L, NP   5 mg at 04/18/24 0103   ondansetron  (ZOFRAN -ODT) disintegrating tablet 4 mg  4 mg Oral Q8H PRN Lynnette Barter, MD   4 mg at 04/02/24 1946   traZODone  (DESYREL ) tablet 50 mg  50 mg Oral QHS White, Patrice L, NP   50 mg at 04/20/24 2032   ziprasidone  (GEODON ) capsule 60 mg  60 mg Oral BID WC Marry Clamp, MD   60 mg at 04/20/24 1651    Lab Results:  No results found for this or any previous visit (from the past 48 hours).   Blood Alcohol level:  Lab Results  Component Value Date   [facility] <15 02/17/2024   ETH <15 01/14/2024    Metabolic Disorder Labs: Lab Results  Component Value Date   HGBA1C 5.1 01/18/2024   MPG 99.67 01/18/2024   MPG 105.41 05/11/2019   Lab Results  Component Value Date   PROLACTIN 24.1 (H) 05/11/2019   Lab Results  Component Value Date   CHOL 154 01/18/2024   TRIG 180 (H) 01/18/2024   HDL 49 01/18/2024   CHOLHDL 3.1 01/18/2024   VLDL 36 01/18/2024   LDLCALC 69 01/18/2024   LDLCALC 82 05/11/2019    Mental Status exam: Appearance: white female of slightly elevated BMI, fair hygiene, poor dentition seen waiting in line for medications  Eye contact: good  Attitude towards examiner  cooperative, pleasant Psychomotor: no agitation or retardation Speech: reduced amount, infrequent one-word responses  Language: simplistic  Mood: great! Affect: euthymic, smiling brightly and waving  Thought content: denying SI, HI, not overtly expressing delusional content Thought Process: remains confused and concrete, childlike  Perception: denying AVH, not overtly RTIS Insight: poor  Judgement: poor   Orientation: to self and hospital  only - not able to accurately report date, situation Attention/Concentration: limited due to cognitive deficits   Memory/Cognition: poor - patient's most recent MOCA on 9/17 was an 8 with significant impairments in attention and memory.  Fund of Knowledge: below average      Musculoskeletal: Strength & Muscle Tone: within normal limits Gait & Station: normal Patient leans: N/A    Physical Exam Constitutional:      Appearance: the patient is not toxic-appearing.  Pulmonary:     Effort: Pulmonary effort is normal.  Neurological:     General: No focal deficit present.     Oriented to self and hospital only, does not verbalize date or situation HEENT:     Eyes EOMI   Review of Systems  Respiratory:  Negative for shortness of breath.   Cardiovascular:  Negative for chest pain.  Gastrointestinal:  Negative for abdominal pain, constipation, diarrhea, nausea and vomiting.  Neurological:  Negative for headaches.   Blood pressure (!) 140/60, pulse 80, temperature 97.6 F (36.4 C), temperature source Oral, resp. rate 16, height 5' 6 (1.676 m), weight 80.7 kg, SpO2 (!) 84%. Body mass index is 28.71 kg/m.   Treatment Plan Summary: Daily contact with patient to assess and evaluate symptoms and progress in treatment and Medication management  Assessment CYLA HALUSKA is a 53 yr old female who presented on 02/17/24 to [facility] ED with a chief complaint of anxiety. In the ED, she was found to be acutely psychotic with tangential speech, agitation, and delusional thought content. Patient was admitted involuntarily to [facility] on 8/14.  Although substance-induced psychosis has previously been documented, she additional presents with prior reported manic episodes and a bipolar 1 diagnosis has been most reflective of this. On this admission, presented with agitation and increased rate of speech, initially had significantly poor sleep and psychotic symptoms, disorganization of thoughts, behavior, concern for AVH.  After approximately 1 week manic symptoms had largely resolved with antipsychotic medications and mood stabilizers however she remained with disorganization in thoughts and behavior and poor self care initially concerning for ongoing psychosis but  after extended observation appears more in line with underlying major neurocognitive disorder due to multiple etiologies. She has significant cognitive impairments with most recent MOCA scoring at 8 and recommendation for 24 hr supervision and help/prompting ADLs. She has been noted to be childlike in her interactions and requires direction to eat and shower but is otherwise pleasant and cooperative with care with no overt psychotic symptoms. Suspect multifactorial causes of cognitive impairment including longstanding substance use, recurrent manic/psychotic episodes, possible underlying low functioning (unknown).   Over the last 3-4 weeks the patient has been pleasant, denying AVH, somewhat more engaged in exam and with better eye contact. She has not been seen to be RTIS or overtly expressing delusional content. She remains psychiatrically stable. Presentation remains reflective of treated bipolar disorder and ongoing neurocognitive impairments. Patient has been accepted for placement pending bed placement, anticipate discharge approximately 10/20    DSM-5 diagnoses: Bipolar 1 Disorder, most recent episode manic, severe, with psychotic features    -currently resolved: no active symptoms of mania or psychosis, just cognitive deficits 2.  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 History of substance induced psychotic disorder   Plan: Legal status: IVC - renewed 9/30 for an additional 14 days  Psychiatric medications:  -Continue Haldol  10 mg q12 for psychosis and mood stability -Continue Geodon  60 mg bid -Continue Depakote  DR 750 mg q12 for mood stability -Continue  Cogentin  1 mg BID for Drug Induced EPS -Continue Agitation Protocol: Zyprexa    Nicotine  Dependence: -Continue Nicotine  Gum 2 mg PRN  Medical concerns: -UTI with U/A noted resistance to bactrim  -Continue Cipro 500 mg BID x 10 days   -initiated 10/13  Additional PRNs -Continue Zofran  4 mg q8 PRN  nausea/vomiting -Continue PRN's: Tylenol , Maalox, Atarax , Milk of Magnesia, Trazodone    --  The risks/benefits/side-effects/alternatives to medications were discussed in detail with the patient and time was given for questions. The patient consents to medication trials.                -- Metabolic profile and EKG monitoring obtained while on an atypical antipsychotic (BMI: 28.73 Lipid Panel: WNL except Trig: 180 HbgA1c: 5.1)              -- Encouraged patient to participate in unit milieu and in scheduled group therapies              -- Short Term Goals: Ability to identify changes in lifestyle to reduce recurrence of condition will improve, Ability to verbalize feelings will improve, Ability to disclose and discuss suicidal ideas, Ability to demonstrate self-control will improve, Ability to identify and develop effective coping behaviors will improve, Ability to maintain clinical measurements within normal limits will improve, Compliance with prescribed medications will improve, and Ability to identify triggers associated with substance abuse/mental health issues will improve             -- Long Term Goals: Improvement in symptoms so as ready for discharge   Safety and Monitoring:             -- Involuntary admission to inpatient psychiatric unit for safety, stabilization and treatment             -- Daily contact with patient to assess and evaluate symptoms and progress in treatment             -- Patient's case to be discussed in multi-disciplinary team meeting             -- Observation Level : q15 minute checks             -- Vital signs:  q12 hours             -- Precautions: suicide, elopement, and assault  Discharge Planning:             -appreciate SW assistance in discharging  [facility] SW now involved and will be getting the patient a guardian and assisting in finding placement. Level 2 filled out and guardianship hearing was on 10/7 - she now has guardian.   -patient accepted into  placement; awaiting bed availability   Leita LOISE Arts, MD 04/21/2024, 7:37 AM

## 2024-04-21 NOTE — Plan of Care (Signed)

## 2024-04-21 NOTE — Progress Notes (Signed)
   04/21/24 2217  Psych Admission Type (Psych Patients Only)  Admission Status Involuntary  Psychosocial Assessment  Patient Complaints None  Eye Contact Fair  Facial Expression Animated;Fixed smile  Affect Preoccupied  Speech Soft  Interaction Childlike  Motor Activity Slow  Appearance/Hygiene Disheveled  Behavior Characteristics Cooperative;Appropriate to situation  Mood Pleasant  Aggressive Behavior  Effect No apparent injury  Thought Process  Coherency Disorganized  Content Preoccupation  Delusions None reported or observed  Perception WDL  Hallucination None reported or observed  Judgment Poor  Confusion Moderate  Danger to Self  Current suicidal ideation? Denies  Danger to Others  Danger to Others None reported or observed

## 2024-04-21 NOTE — Group Note (Signed)
 Occupational Therapy Group Note  Group Topic:Coping Skills  Group Date: 04/21/2024 Start Time: 1535 End Time: 1603 Facilitators: Dot Dallas MATSU, OT   Group Description: Group encouraged increased engagement and participation through discussion and activity focused on Coping Ahead. Patients were split up into teams and selected a card from a stack of positive coping strategies. Patients were instructed to act out/charade the coping skill for other peers to guess and receive points for their team. Discussion followed with a focus on identifying additional positive coping strategies and patients shared how they were going to cope ahead over the weekend while continuing hospitalization stay.  Therapeutic Goal(s): Identify positive vs negative coping strategies. Identify coping skills to be used during hospitalization vs coping skills outside of hospital/at home Increase participation in therapeutic group environment and promote engagement in treatment   Participation Level: Minimal   Participation Quality: Minimal Cues   Behavior: Off-task   Speech/Thought Process: Loose association    Affect/Mood: Stable    Insight: Impaired   Judgement: Impaired      Modes of Intervention: Education  Patient Response to Interventions:  Attentive   Plan: Continue to engage patient in OT groups 2 - 3x/week.  04/21/2024  [place] MATSU Dot, OT  Jahson Emanuele, OT

## 2024-04-21 NOTE — Group Note (Signed)
 Recreation Therapy Group Note   Group Topic:Self-Esteem  Group Date: 04/21/2024 Start Time: 1005 End Time: 1045 Facilitators: Cathleen Yagi-McCall, LRT,CTRS Location: [address] Dayroom   Group Topic: Self-Esteem  Goal Area(s) Addresses:  Patient will successfully identify positive attributes about themselves.  Patient will identify healthy ways to increase self-esteem. Patient will acknowledge benefit(s) of improved self-esteem.   Behavioral Response: Observant  Intervention: Sheet with 4 picture frames, Markers, Music  Activity: Pictures of Me. LRT and patients discussed how identify provides direction and meaning to ones life. Patients were to identify 4 ways they identify themselves and draw a picture of how they see themselves in role. Patients were to put each of these identities in one of the picture frames provided on the sheet. LRT and patients discussed what everyone was able to come up with.  Education:  Self-Esteem, Discharge Planning  Education Outcome: Acknowledges education/In group clarification offered/Needs additional education   Affect/Mood: Appropriate   Participation Level: Minimal   Participation Quality: Independent   Behavior: Appropriate   Speech/Thought Process: Incoherent   Insight: Lacking   Judgement: Lacking    Modes of Intervention: Worksheet   Patient Response to Interventions:  Attentive   Education Outcome:  In group clarification offered    Clinical Observations/Individualized Feedback: Pt was bright and smiling throughout group. Pt appeared to be in good spirits. Pt attempted to complete the activity but was unable to follow along with the instructions. Pt mostly observed and was comforting to a peer who was crying during group session.     Plan: Continue to engage patient in RT group sessions 2-3x/week.   Masayuki Sakai-McCall, LRT,CTRS  04/21/2024 11:50 AM

## 2024-04-21 NOTE — Progress Notes (Signed)
 [facility]) [phone]  Patient continues to be on the waiting list.   Jayliani Wanner, LCSWA 04/21/2024

## 2024-04-21 NOTE — Progress Notes (Signed)
(  Sleep Hours) -4 (Any PRNs that were needed, meds refused, or side effects to meds)- Tylenol  (Any disturbances and when (visitation, over night)-none (Concerns raised by the patient)- none (SI/HI/AVH)-denied

## 2024-04-21 NOTE — Group Note (Signed)
 Date:  04/21/2024 Time:  8:27 PM  Group Topic/Focus:  Wrap-Up Group:   The focus of this group is to help patients review their daily goal of treatment and discuss progress on daily workbooks.    Participation Level:  Active  Participation Quality:  Appropriate  Affect:  Appropriate  Cognitive:  Lacking  Insight: Lacking  Engagement in Group:  Engaged  Modes of Intervention:  Education and Exploration  Additional Comments:  Patient attended and participated in group tonight.  She reports that she did not learn anything today. She just slept.  Gwenn Chillington Dacosta 04/21/2024, 8:27 PM

## 2024-04-21 NOTE — Plan of Care (Signed)
   Problem: Education: Goal: Emotional status will improve Outcome: Progressing Goal: Mental status will improve Outcome: Progressing Goal: Verbalization of understanding the information provided will improve Outcome: Progressing   Problem: Activity: Goal: Interest or engagement in activities will improve Outcome: Progressing

## 2024-04-21 NOTE — Progress Notes (Addendum)
 [facility] [place], [place]) [phone]  CSW left a voicemail.   Collateral contact - DSS Adult Protective Services Social Worker Izetta Roses) [phone]  Adult Protective Services Social Worker Izetta Roses [phone]  Legal guardian said tomorrow, Friday, 10/17, she will send the referrals to [facility] and [facility] (CSW partially completed these referrals).   [facility] [address] [place], [place] [address]  No one answered the phone.  The voicemail box was full so CSW couldn't have a voicemail.  [facility]  [address] [place], [place], [place] [address] [phone]  There are no beds available however one of the owners lives in a 3-bedroom home and would be available to take patient to her home.  Her home is not licensed yet.   [facility], [address] [place], [place] [address], [phone]  There are no beds available.   Woodford Strege, LCSWA 04/21/2024

## 2024-04-21 NOTE — Progress Notes (Signed)
   04/20/24 2022  Psych Admission Type (Psych Patients Only)  Admission Status Involuntary  Psychosocial Assessment  Patient Complaints None  Eye Contact Fair  Facial Expression Animated;Fixed smile  Affect Preoccupied  Speech Soft  Interaction Childlike  Motor Activity Slow  Appearance/Hygiene Disheveled  Behavior Characteristics Cooperative;Appropriate to situation  Mood Pleasant  Aggressive Behavior  Effect No apparent injury  Thought Process  Coherency Disorganized  Content Preoccupation  Delusions None reported or observed  Perception WDL  Hallucination None reported or observed  Judgment Poor  Confusion Moderate  Danger to Self  Current suicidal ideation? Denies  Danger to Others  Danger to Others None reported or observed

## 2024-04-22 DIAGNOSIS — F0280 Dementia in other diseases classified elsewhere without behavioral disturbance: Secondary | ICD-10-CM | POA: Diagnosis not present

## 2024-04-22 DIAGNOSIS — F312 Bipolar disorder, current episode manic severe with psychotic features: Secondary | ICD-10-CM | POA: Diagnosis not present

## 2024-04-22 NOTE — Plan of Care (Signed)
   Problem: Education: Goal: Knowledge of [facility] Education information/materials will improve Outcome: Progressing Goal: Emotional status will improve Outcome: Progressing Goal: Mental status will improve Outcome: Progressing   Problem: Activity: Goal: Interest or engagement in activities will improve Outcome: Progressing

## 2024-04-22 NOTE — Plan of Care (Signed)
 Pt remains disorganized, confused but pleasant and interactive in in milieu. Remains medication compliant without adverse drug reactions. Tolerates meals and fluids well. Safety checks maintained at Q 15 minutes intervals.   Problem: Education: Goal: Emotional status will improve Outcome: Progressing   Problem: Activity: Goal: Interest or engagement in activities will improve Outcome: Progressing   Problem: Safety: Goal: Periods of time without injury will increase Outcome: Progressing

## 2024-04-22 NOTE — Progress Notes (Addendum)
 Collateral contact - DSS Adult Protective Services Social Worker Izetta Roses) [phone]     Katie informed that she will send referrals to [facility] and [facility] on Monday, 10/16.   Legal guardian emailed: I wanted to let you know if an emergency were to arise over the weekend, ever, please contact our after-hours line @ [phone].   [facility] [place], [place]) [phone]  Donzell said that there is a high chance she will have available bed the week of October 27.    [facility] [address], [place], [place] [address] [phone]  There are no beds available at this time.   [facility] [address], [place], [place] [address] [phone] - this number is not  in service  [phone] - someone answered the phone and then hung up.    [facility] (recommended by legal guardian), [place] [phone]  There are no beds available at any locations.     Evann Erazo, LCSWA 04/22/2024

## 2024-04-22 NOTE — Group Note (Signed)
 Date:  04/22/2024 Time:  9:07 AM  Group Topic/Focus:  Goals Group:   The focus of this group is to help patients establish daily goals to achieve during treatment and discuss how the patient can incorporate goal setting into their daily lives to aide in recovery.    Participation Level:  Did Not Attend  Allison Bolton 04/22/2024, 9:07 AM

## 2024-04-22 NOTE — Progress Notes (Signed)
 Spirituality group facilitated by Elia Rockie Sofia, BCC.  Group Description: Group focused on topic of hope. Patients participated in facilitated discussion around topic, connecting with one another around experiences and definitions for hope. Group members engaged with visual explorer photos, reflecting on what hope looks like for them today. Group engaged in discussion around how their definitions of hope are present today in hospital.  Modalities: Psycho-social ed, Adlerian, Narrative, MI  Patient Progress: Did not attend.

## 2024-04-22 NOTE — Progress Notes (Signed)
 [facility] MD Progress Note  04/22/2024 9:56 AM Allison Bolton  MRN:  [id number]  Principal Problem: Bipolar affective disorder, most recent episode manic with psychotic symptoms (HCC)     Major Neurocognitive Disorder due to multiple etiologies  Diagnosis: Principal Problem:   Bipolar affective disorder, current episode manic with psychotic symptoms (HCC)  Major Neurocognitive Disorder due to multiple etiologies    Total Time spent with patient:  I personally spent 25 minutes on the unit in direct patient care. The direct patient care time included face-to-face time with the patient, reviewing the patient's chart, communicating with other professionals, and coordinating care.   Identifying Information and brief psychiatric history:  Allison Bolton is a 53 yr old female with working psychiatric diagnoses of bipolar 1 disorder and major neurocognitive disorder. 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 She does have a history of prior psychiatric hospitalizations with substance use driving psychotic symptoms, however on this admission has presented with clear manic and psychotic symptoms that cleared with time and medications, however underlying neurocognitive deficits have remained. She has one prior suicide Attempt (2007), and Prior Psychiatric Hospitalizations (last- [facility] 01/2024).  On this admission she presented to [facility] ED on 02/17/24  with a chief complaint of anxiety. In the ED, she was found to be acutely psychotic with tangential speech, agitation, and delusional thought content. Patient was admitted involuntarily to [facility] on 8/14. Since admission the patient has been notably unresponsive to antipsychotic medications, largely related to poor self-care appearing disheveled and requiring direction to do ADLs. Although initially this was concerning for lingering psychotic symptoms, several OT evaluations have noted significant cognitive deficits  and poor self care is now thought to be related to major neurocognitive disorder.   Interval events:  Patient has been compliant with care, attending groups with limited ability to engage. No behavioral concerns. Vitals: BP (!) 99/54   Pulse (!) 107   Temp 97.6 F (36.4 C) (Oral)   Resp 16   Ht 5' 6 (1.676 m)   Wt 80.7 kg   SpO2 (!) 77%   BMI 28.71 kg/m    Interview today:  Today the patient is somnolent. Briefly answers that she is good. Before going back to sleep. No SI/HI or AVH voiced.    Past Medical History:  Past Medical History:  Diagnosis Date   Anxiety    Arthritis    Asthma    Bipolar 1 disorder (HCC)    Diverticulosis    Dysrhythmia    sts I have heart palpitations   Fibromyalgia    H/O hiatal hernia    4   Headache(784.0)    High blood pressure    Meniere's disease    S/P colonoscopy    Dr. Golda 2010: few small diverticula at sigmoid. otherwise normal.    Shortness of breath     Past Surgical History:  Procedure Laterality Date   ABDOMINAL HYSTERECTOMY     APPENDECTOMY     CESAREAN SECTION     CHOLECYSTECTOMY     COLONOSCOPY N/A 12/25/2023   Procedure: COLONOSCOPY;  Surgeon: Eartha Angelia Sieving, MD;  Location: [facility];  Service: Gastroenterology;  Laterality: N/A;  11:30am, asa 1   exploratory laparoscopy     FOOT SURGERY     HEMORRHOID SURGERY     LAPAROSCOPIC APPENDECTOMY  02/19/2011   Procedure: APPENDECTOMY LAPAROSCOPIC;  Surgeon: Oneil DELENA Budge;  Location: [facility] ORS;  Service: General;  Laterality: N/A;  LAPAROSCOPY  02/19/2011   Procedure: LAPAROSCOPY DIAGNOSTIC;  Surgeon: Oneil DELENA Budge;  Location: [facility];  Service: General;  Laterality: N/A;   left ovarian removal     multiple hernia repairs     Right ovarian removal     TONSILLECTOMY     TONSILLECTOMY AND ADENOIDECTOMY     tubes in ears     UMBILICAL HERNIA REPAIR  Dec 2011   Dr. Budge   wisdom teeth removal     Family History:  Family History  Problem Relation  Age of Onset   Thyroid  disease Mother    Colon cancer Father    Breast cancer Maternal Aunt    Colon cancer Brother    Colon cancer Other        paternal and maternal grandfather   Meniere's disease Other    Anesthesia problems Neg Hx    Hypotension Neg Hx    Malignant hyperthermia Neg Hx    Pseudochol deficiency Neg Hx    Family Psychiatric  History:  None Reported   Social History:  Social History   Substance and Sexual Activity  Alcohol Use No   Comment: not since June 2012     Social History   Substance and Sexual Activity  Drug Use Yes   Types: Marijuana   Comment: patient denies any-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    Social History   Socioeconomic History   Marital status: Legally Separated    Spouse name: Not on file   Number of children: Not on file   Years of education: Not on file   Highest education level: Not on file  Occupational History   Not on file  Tobacco Use   Smoking status: Every Day    Current packs/day: 0.00    Average packs/day: 0.5 packs/day for 37.3 years (18.7 ttl pk-yrs)    Types: Cigarettes    Start date: 07/07/1981    Last attempt to quit: 11/05/2018    Years since quitting: 5.4   Smokeless tobacco: Never  Vaping Use   Vaping status: Never Used  Substance and Sexual Activity   Alcohol use: No    Comment: not since June 2012   Drug use: Yes    Types: Marijuana    Comment: patient denies any-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   Sexual activity: Never    Birth control/protection: Surgical  Other Topics Concern   Not on file  Social History Narrative   Not on file   Social Drivers of Health   Financial Resource Strain: Not on file  Food Insecurity: Food Insecurity Present (02/18/2024)   Hunger Vital Sign    Worried About Running Out of Food in the Last Year: Sometimes true    Ran Out of Food in the Last Year: Sometimes true  Transportation Needs: Unmet Transportation Needs (02/18/2024)   PRAPARE -  Administrator, Civil Service (Medical): Yes    Lack of Transportation (Non-Medical): Yes  Physical Activity: Not on file  Stress: Not on file  Social Connections: Unknown (10/14/2021)   [facility] from [facility]   Social Connections    Do your friends and family support you?: Not on file    What agencies support you?: Not on file    Current Medications: Current Facility-Administered Medications  Medication Dose Route Frequency Provider Last Rate Last Admin   acetaminophen  (TYLENOL ) tablet 650 mg  650 mg Oral Q6H PRN White, Patrice L,  NP   650 mg at 04/21/24 2053   alum & mag hydroxide-simeth (MAALOX/MYLANTA) 200-200-20 MG/5ML suspension 30 mL  30 mL Oral Q4H PRN White, Patrice L, NP   30 mL at 03/24/24 0217   benztropine  (COGENTIN ) tablet 1 mg  1 mg Oral BID White, Patrice L, NP   1 mg at 04/22/24 9192   ciprofloxacin (CIPRO) tablet 500 mg  500 mg Oral BID Pashayan, Alexander S, DO   500 mg at 04/22/24 9192   divalproex  (DEPAKOTE ) DR tablet 750 mg  750 mg Oral Q12H Ji, Andrew, MD   750 mg at 04/22/24 9192   haloperidol  (HALDOL ) tablet 10 mg  10 mg Oral Q12H Ji, Andrew, MD   10 mg at 04/22/24 9192   hydrOXYzine  (ATARAX ) tablet 25 mg  25 mg Oral TID PRN White, Patrice L, NP   25 mg at 04/21/24 2054   magnesium  hydroxide (MILK OF MAGNESIA) suspension 30 mL  30 mL Oral Daily PRN White, Patrice L, NP       nicotine  polacrilex (NICORETTE ) gum 2 mg  2 mg Oral PRN Trudy Carwin, NP   2 mg at 04/21/24 2014   OLANZapine  (ZYPREXA ) injection 10 mg  10 mg Intramuscular TID PRN White, Patrice L, NP       OLANZapine  (ZYPREXA ) injection 5 mg  5 mg Intramuscular TID PRN White, Patrice L, NP       OLANZapine  zydis (ZYPREXA ) disintegrating tablet 5 mg  5 mg Oral TID PRN White, Patrice L, NP   5 mg at 04/18/24 0103   ondansetron  (ZOFRAN -ODT) disintegrating tablet 4 mg  4 mg Oral Q8H PRN Lynnette Barter, MD   4 mg at 04/02/24 1946   traZODone  (DESYREL ) tablet 50 mg  50 mg Oral QHS  White, Patrice L, NP   50 mg at 04/21/24 2054   ziprasidone  (GEODON ) capsule 60 mg  60 mg Oral BID WC Marry Clamp, MD   60 mg at 04/22/24 9192    Lab Results:  No results found for this or any previous visit (from the past 48 hours).   Blood Alcohol level:  Lab Results  Component Value Date   [facility] <15 02/17/2024   ETH <15 01/14/2024    Metabolic Disorder Labs: Lab Results  Component Value Date   HGBA1C 5.1 01/18/2024   MPG 99.67 01/18/2024   MPG 105.41 05/11/2019   Lab Results  Component Value Date   PROLACTIN 24.1 (H) 05/11/2019   Lab Results  Component Value Date   CHOL 154 01/18/2024   TRIG 180 (H) 01/18/2024   HDL 49 01/18/2024   CHOLHDL 3.1 01/18/2024   VLDL 36 01/18/2024   LDLCALC 69 01/18/2024   LDLCALC 82 05/11/2019    Mental Status exam: Appearance: white female of slightly elevated BMI, fair hygiene, seen sleeping in bed, briefly rouses  Eye contact: good  Attitude towards examiner  cooperative, pleasant Psychomotor: no agitation or retardation Speech: reduced amount, infrequent one-word responses  Language: simplistic  Mood: good Affect: somnolent   Thought content: denying SI, HI, not overtly expressing delusional content Thought Process: remains confused and concrete, childlike  Perception: denying AVH, not overtly RTIS Insight: poor  Judgement: poor   Orientation: to self and hospital only - not able to accurately report date, situation Attention/Concentration: limited due to cognitive deficits   Memory/Cognition: poor - patient's most recent MOCA on 9/17 was an 8 with significant impairments in attention and memory.  Fund of Knowledge: below average  Musculoskeletal: Strength & Muscle Tone: within normal limits Gait & Station: normal Patient leans: N/A    Physical Exam Constitutional:      Appearance: the patient is not toxic-appearing.  Pulmonary:     Effort: Pulmonary effort is normal.  Neurological:     General: No focal  deficit present.     Oriented to self and hospital only, does not verbalize date or situation HEENT:     Eyes EOMI   Review of Systems  Respiratory:  Negative for shortness of breath.   Cardiovascular:  Negative for chest pain.  Gastrointestinal:  Negative for abdominal pain, constipation, diarrhea, nausea and vomiting.  Neurological:  Negative for headaches.   Blood pressure (!) 99/54, pulse (!) 107, temperature 97.6 F (36.4 C), temperature source Oral, resp. rate 16, height 5' 6 (1.676 m), weight 80.7 kg, SpO2 (!) 77%. Body mass index is 28.71 kg/m.   Treatment Plan Summary: Daily contact with patient to assess and evaluate symptoms and progress in treatment and Medication management  Assessment Allison Bolton is a 53 yr old female who presented on 02/17/24 to [facility] ED with a chief complaint of anxiety. In the ED, she was found to be acutely psychotic with tangential speech, agitation, and delusional thought content. Patient was admitted involuntarily to [facility] on 8/14.  Although substance-induced psychosis has previously been documented, she additional presents with prior reported manic episodes and a bipolar 1 diagnosis has been most reflective of this. On this admission, presented with agitation and increased rate of speech, initially had significantly poor sleep and psychotic symptoms, disorganization of thoughts, behavior, concern for AVH.  After approximately 1 week manic symptoms had largely resolved with antipsychotic medications and mood stabilizers however she remained with disorganization in thoughts and behavior and poor self care initially concerning for ongoing psychosis but after extended observation appears more in line with underlying major neurocognitive disorder due to multiple etiologies. She has significant cognitive impairments with most recent MOCA scoring at 8 and recommendation for 24 hr supervision and help/prompting ADLs. She has been noted to be childlike  in her interactions and requires direction to eat and shower but is otherwise pleasant and cooperative with care with no overt psychotic symptoms. Suspect multifactorial causes of cognitive impairment including longstanding substance use, recurrent manic/psychotic episodes, possible underlying low functioning (unknown).   Over the last 3-4 weeks the patient has been pleasant, denying AVH, somewhat more engaged in exam and with better eye contact. She has not been seen to be RTIS or overtly expressing delusional content. She remains psychiatrically stable. Presentation remains reflective of treated bipolar disorder and ongoing neurocognitive impairments. Patient has been accepted for placement pending bed placement, anticipate discharge approximately 10/20    DSM-5 diagnoses: Bipolar 1 Disorder, most recent episode manic, severe, with psychotic features    -currently resolved: no active symptoms of mania or psychosis, just cognitive deficits 2.  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 History of substance induced psychotic disorder   Plan: Legal status: IVC - renewed 9/30 for an additional 14 days  Psychiatric medications:  -Continue Haldol  10 mg q12 for psychosis and mood stability -Continue Geodon  60 mg bid -Continue Depakote  DR 750 mg q12 for mood stability -Continue Cogentin  1 mg BID for Drug Induced EPS -Continue Agitation Protocol: Zyprexa    Nicotine  Dependence: -Continue Nicotine  Gum 2 mg PRN  Medical concerns: -UTI with U/A noted resistance to bactrim  -Continue Cipro 500 mg BID x 10 days   -initiated 10/13  Additional PRNs -Continue Zofran  4 mg q8 PRN nausea/vomiting -Continue PRN's: Tylenol , Maalox, Atarax , Milk of Magnesia, Trazodone    --  The risks/benefits/side-effects/alternatives to medications were discussed in detail with the patient and time was given for questions. The patient consents to medication trials.                -- Metabolic  profile and EKG monitoring obtained while on an atypical antipsychotic (BMI: 28.73 Lipid Panel: WNL except Trig: 180 HbgA1c: 5.1)              -- Encouraged patient to participate in unit milieu and in scheduled group therapies              -- Short Term Goals: Ability to identify changes in lifestyle to reduce recurrence of condition will improve, Ability to verbalize feelings will improve, Ability to disclose and discuss suicidal ideas, Ability to demonstrate self-control will improve, Ability to identify and develop effective coping behaviors will improve, Ability to maintain clinical measurements within normal limits will improve, Compliance with prescribed medications will improve, and Ability to identify triggers associated with substance abuse/mental health issues will improve             -- Long Term Goals: Improvement in symptoms so as ready for discharge   Safety and Monitoring:             -- Involuntary admission to inpatient psychiatric unit for safety, stabilization and treatment             -- Daily contact with patient to assess and evaluate symptoms and progress in treatment             -- Patient's case to be discussed in multi-disciplinary team meeting             -- Observation Level : q15 minute checks             -- Vital signs:  q12 hours             -- Precautions: suicide, elopement, and assault  Discharge Planning:             -appreciate SW assistance in discharging  [facility] SW now involved and will be getting the patient a guardian and assisting in finding placement. Level 2 filled out and guardianship hearing was on 10/7 - she now has guardian.   -patient accepted into placement; awaiting bed availability   Leita LOISE Arts, MD 04/22/2024, 9:56 AM

## 2024-04-22 NOTE — Group Note (Signed)
 Recreation Therapy Group Note   Group Topic:Health and Wellness  Group Date: 04/22/2024 Start Time: 1020 End Time: 1047 Facilitators: Curtistine Pettitt-McCall, LRT,CTRS Location: [address] Dayroom   Group Topic: Wellness  Goal Area(s) Addresses:  Patient will define components of whole wellness. Patient will verbalize benefit of whole wellness.  Behavioral Response:   Intervention: Music  Activity: Exercise. Patients took turns leading group in the exercises of their choosing. Patients determined the difficulty of the exercises presented in group. Patients were encouraged not to do any exercise that was to difficult or aggravated any previous injuries. Patients were also encouraged to take breaks or get water  as needed.     Education: Wellness, Building control surveyor.   Education Outcome: Acknowledges education/In group clarification offered/Needs additional education.    Affect/Mood: N/A   Participation Level: Did not attend    Clinical Observations/Individualized Feedback:      Plan: Continue to engage patient in RT group sessions 2-3x/week.   Allison Bolton, LRT,CTRS 04/22/2024 12:49 PM

## 2024-04-22 NOTE — Group Note (Signed)
 Date:  04/22/2024 Time:  8:28 PM  Group Topic/Focus:  Wrap-Up Group:   The focus of this group is to help patients review their daily goal of treatment and discuss progress on daily workbooks.    Additional Comments:  Pt was encouraged, but opted out of attending wrap up group this evening.   Allison Bolton 04/22/2024, 8:28 PM

## 2024-04-22 NOTE — Progress Notes (Signed)
 Patient denies SI, AH, VH. Patient stated they slept good last night. Scored zero on anxiety and depression. Patient denies pain and has been calm, cooperative, and med compliant.    Mercie VEAR Banana, RN 10:34 AM

## 2024-04-22 NOTE — Progress Notes (Signed)
(  Sleep Hours) -9.25 (Any PRNs that were needed, meds refused, or side effects to meds)- Atarax  (Any disturbances and when (visitation, over night)-none (Concerns raised by the patient)- none (SI/HI/AVH)-denied

## 2024-04-23 DIAGNOSIS — F0280 Dementia in other diseases classified elsewhere without behavioral disturbance: Secondary | ICD-10-CM | POA: Diagnosis not present

## 2024-04-23 DIAGNOSIS — F312 Bipolar disorder, current episode manic severe with psychotic features: Secondary | ICD-10-CM | POA: Diagnosis not present

## 2024-04-23 NOTE — Plan of Care (Signed)
   Problem: Education: Goal: Emotional status will improve Outcome: Progressing Goal: Mental status will improve Outcome: Progressing

## 2024-04-23 NOTE — [facility] Group Notes (Signed)
 Adult Psychoeducational Group Note  Date:  04/23/2024 Time:  8:36 PM  Group Topic/Focus:  Wrap-Up Group:   The focus of this group is to help patients review their daily goal of treatment and discuss progress on daily workbooks.  Participation Level:  None  Additional Comments:  Pt attended the evening group, but declined to participate when spoken to by the Writer.  Najir Roop Lee 04/23/2024, 8:36 PM

## 2024-04-23 NOTE — Progress Notes (Signed)
(  Sleep Hours) - 8.75 (Any PRNs that were needed, meds refused, or side effects to meds)- none (Any disturbances and when (visitation, over night) (Concerns raised by the patient)- none (SI/HI/AVH)-denies

## 2024-04-23 NOTE — Progress Notes (Signed)
 Tour of Duty:  [name], RN, 04/23/24, Tour of Duty: 0700-1900  SI/HI/AVH: Denies  Self-Reported   Mood: Positive  Anxiety: Denies Depression: Denies Irritability: Denies  Broset  Violence Prevention Guidelines *See Row Information*: Moderate Violence Risk interventions implemented   LBM  Last BM Date : 04/22/24   Pain: present, PRN provided (see MAR)  Patient Refusals (including Rx): No  >>Shift Summary: Patient observed to be calm on unit. Patient able to make needs known. Patient thoughts remain disorganized and patient is confused. Today patient preoccupied with her car. Patient observed to engage appropriately with staff and peers. Patient taking medications as prescribed. This shift, PRN medication requested or required. No reported or observed side effects to medication. No reported or observed agitation, aggression, or other acute emotional distress. No reported or observed physical abnormalities or concerns.  Last Vitals  Vitals Weight: 80.7 kg Temp: (!) 97.4 F (36.3 C) Temp Source: Oral Pulse Rate: 78 Resp: 16 BP: 101/71 Patient Position: (not recorded)  Admission Type  Psych Admission Type (Psych Patients Only) Admission Status: Involuntary Date 72 hour document signed : (not recorded) Time 72 hour document signed : (not recorded) Provider Notified (First and Last Name) (see details for LINK to note): (not recorded)   Psychosocial Assessment  Psychosocial Assessment Patient Complaints: None Eye Contact: Fair Facial Expression: Animated Affect: Appropriate to circumstance Speech: Soft Interaction: Childlike Motor Activity: Slow Appearance/Hygiene: Disheveled Behavior Characteristics: Cooperative Mood: Pleasant   Aggressive Behavior  Targets: (not recorded)   Thought Process  Thought Process Coherency: Disorganized Content: Preoccupation Delusions: None reported or observed Perception: Within Defined Limits Hallucination: None  reported or observed Judgment: Poor Confusion: Mild  Danger to Self/Others  Danger to Self Current suicidal ideation?: Denies Description of Suicide Plan: (not recorded) Self-Injurious Behavior: (not recorded) Agreement Not to Harm Self: (not recorded) Description of Agreement: (not recorded) Danger to Others: None reported or observed

## 2024-04-23 NOTE — Progress Notes (Signed)
 [facility] MD Progress Note  04/23/2024 7:50 AM Allison Bolton  MRN:  [id number]  Principal Problem: Bipolar affective disorder, most recent episode manic with psychotic symptoms (HCC)     Major Neurocognitive Disorder due to multiple etiologies  Diagnosis: Principal Problem:   Bipolar affective disorder, current episode manic with psychotic symptoms (HCC)  Major Neurocognitive Disorder due to multiple etiologies    Total Time spent with patient:  I personally spent 25 minutes on the unit in direct patient care. The direct patient care time included face-to-face time with the patient, reviewing the patient's chart, communicating with other professionals, and coordinating care.   Identifying Information and brief psychiatric history:  Allison Bolton is a 53 yr old female with working psychiatric diagnoses of bipolar 1 disorder and major neurocognitive disorder. 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 She does have a history of prior psychiatric hospitalizations with substance use driving psychotic symptoms, however on this admission has presented with clear manic and psychotic symptoms that cleared with time and medications, however underlying neurocognitive deficits have remained. She has one prior suicide Attempt (2007), and Prior Psychiatric Hospitalizations (last- [facility] 01/2024).  On this admission she presented to [facility] ED on 02/17/24  with a chief complaint of anxiety. In the ED, she was found to be acutely psychotic with tangential speech, agitation, and delusional thought content. Patient was admitted involuntarily to [facility] on 8/14. Since admission the patient has been notably unresponsive to antipsychotic medications, largely related to poor self-care appearing disheveled and requiring direction to do ADLs. Although initially this was concerning for lingering psychotic symptoms, several OT evaluations have noted significant cognitive deficits  and poor self care is now thought to be related to major neurocognitive disorder.   Interval events:  [facility] SW is working on referrals to alternative group homes - to be sent on Monday. Patient has been compliant with care, easily directed, medication compliant and no behavioral concerns. Vitals: BP (!) 107/90 (BP Location: Right Arm)   Pulse 98   Temp (!) 97.4 F (36.3 C) (Oral)   Resp 16   Ht 5' 6 (1.676 m)   Wt 80.7 kg   SpO2 90%   BMI 28.71 kg/m    Interview today:  Today the patient is seen in bed surrounded by pillows and states yes! When asked if she is building a pillow fort. States her mood as good and reports good sleep and appetite. No concerns voiced today and denies SI, HI or AVH.    Past Medical History:  Past Medical History:  Diagnosis Date   Anxiety    Arthritis    Asthma    Bipolar 1 disorder (HCC)    Diverticulosis    Dysrhythmia    sts I have heart palpitations   Fibromyalgia    H/O hiatal hernia    4   Headache(784.0)    High blood pressure    Meniere's disease    S/P colonoscopy    Dr. Golda 2010: few small diverticula at sigmoid. otherwise normal.    Shortness of breath     Past Surgical History:  Procedure Laterality Date   ABDOMINAL HYSTERECTOMY     APPENDECTOMY     CESAREAN SECTION     CHOLECYSTECTOMY     COLONOSCOPY N/A 12/25/2023   Procedure: COLONOSCOPY;  Surgeon: Eartha Angelia Sieving, MD;  Location: [facility];  Service: Gastroenterology;  Laterality: N/A;  11:30am, asa 1   exploratory laparoscopy  FOOT SURGERY     HEMORRHOID SURGERY     LAPAROSCOPIC APPENDECTOMY  02/19/2011   Procedure: APPENDECTOMY LAPAROSCOPIC;  Surgeon: Oneil DELENA Budge;  Location: [facility] ORS;  Service: General;  Laterality: N/A;   LAPAROSCOPY  02/19/2011   Procedure: LAPAROSCOPY DIAGNOSTIC;  Surgeon: Oneil DELENA Budge;  Location: [facility] ORS;  Service: General;  Laterality: N/A;   left ovarian removal     multiple hernia repairs     Right ovarian removal      TONSILLECTOMY     TONSILLECTOMY AND ADENOIDECTOMY     tubes in ears     UMBILICAL HERNIA REPAIR  Dec 2011   Dr. Budge   wisdom teeth removal     Family History:  Family History  Problem Relation Age of Onset   Thyroid  disease Mother    Colon cancer Father    Breast cancer Maternal Aunt    Colon cancer Brother    Colon cancer Other        paternal and maternal grandfather   Meniere's disease Other    Anesthesia problems Neg Hx    Hypotension Neg Hx    Malignant hyperthermia Neg Hx    Pseudochol deficiency Neg Hx    Family Psychiatric  History:  None Reported   Social History:  Social History   Substance and Sexual Activity  Alcohol Use No   Comment: not since June 2012     Social History   Substance and Sexual Activity  Drug Use Yes   Types: Marijuana   Comment: patient denies any-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    Social History   Socioeconomic History   Marital status: Legally Separated    Spouse name: Not on file   Number of children: Not on file   Years of education: Not on file   Highest education level: Not on file  Occupational History   Not on file  Tobacco Use   Smoking status: Every Day    Current packs/day: 0.00    Average packs/day: 0.5 packs/day for 37.3 years (18.7 ttl pk-yrs)    Types: Cigarettes    Start date: 07/07/1981    Last attempt to quit: 11/05/2018    Years since quitting: 5.4   Smokeless tobacco: Never  Vaping Use   Vaping status: Never Used  Substance and Sexual Activity   Alcohol use: No    Comment: not since June 2012   Drug use: Yes    Types: Marijuana    Comment: patient denies any-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   Sexual activity: Never    Birth control/protection: Surgical  Other Topics Concern   Not on file  Social History Narrative   Not on file   Social Drivers of Health   Financial Resource Strain: Not on file  Food Insecurity: Food Insecurity Present (02/18/2024)   Hunger Vital Sign     Worried About Running Out of Food in the Last Year: Sometimes true    Ran Out of Food in the Last Year: Sometimes true  Transportation Needs: Unmet Transportation Needs (02/18/2024)   PRAPARE - Administrator, Civil Service (Medical): Yes    Lack of Transportation (Non-Medical): Yes  Physical Activity: Not on file  Stress: Not on file  Social Connections: Unknown (10/14/2021)   Received from [facility]   Social Connections    Do your friends and family support you?: Not on file    What agencies support  you?: Not on file    Current Medications: Current Facility-Administered Medications  Medication Dose Route Frequency Provider Last Rate Last Admin   acetaminophen  (TYLENOL ) tablet 650 mg  650 mg Oral Q6H PRN White, Patrice L, NP   650 mg at 04/21/24 2053   alum & mag hydroxide-simeth (MAALOX/MYLANTA) 200-200-20 MG/5ML suspension 30 mL  30 mL Oral Q4H PRN White, Patrice L, NP   30 mL at 03/24/24 0217   benztropine  (COGENTIN ) tablet 1 mg  1 mg Oral BID White, Patrice L, NP   1 mg at 04/22/24 1702   ciprofloxacin (CIPRO) tablet 500 mg  500 mg Oral BID Pashayan, Alexander S, DO   500 mg at 04/22/24 2043   divalproex  (DEPAKOTE ) DR tablet 750 mg  750 mg Oral Q12H Ji, Andrew, MD   750 mg at 04/22/24 2043   haloperidol  (HALDOL ) tablet 10 mg  10 mg Oral Q12H Ji, Andrew, MD   10 mg at 04/22/24 2043   hydrOXYzine  (ATARAX ) tablet 25 mg  25 mg Oral TID PRN White, Patrice L, NP   25 mg at 04/21/24 2054   magnesium  hydroxide (MILK OF MAGNESIA) suspension 30 mL  30 mL Oral Daily PRN White, Patrice L, NP       nicotine  polacrilex (NICORETTE ) gum 2 mg  2 mg Oral PRN Trudy Carwin, NP   2 mg at 04/21/24 2014   OLANZapine  (ZYPREXA ) injection 10 mg  10 mg Intramuscular TID PRN White, Patrice L, NP       OLANZapine  (ZYPREXA ) injection 5 mg  5 mg Intramuscular TID PRN White, Patrice L, NP       OLANZapine  zydis (ZYPREXA ) disintegrating tablet 5 mg  5 mg Oral TID PRN White, Patrice L,  NP   5 mg at 04/18/24 0103   ondansetron  (ZOFRAN -ODT) disintegrating tablet 4 mg  4 mg Oral Q8H PRN Lynnette Barter, MD   4 mg at 04/02/24 1946   traZODone  (DESYREL ) tablet 50 mg  50 mg Oral QHS White, Patrice L, NP   50 mg at 04/22/24 2043   ziprasidone  (GEODON ) capsule 60 mg  60 mg Oral BID WC Marry Clamp, MD   60 mg at 04/22/24 1702    Lab Results:  No results found for this or any previous visit (from the past 48 hours).   Blood Alcohol level:  Lab Results  Component Value Date   [facility] <15 02/17/2024   ETH <15 01/14/2024    Metabolic Disorder Labs: Lab Results  Component Value Date   HGBA1C 5.1 01/18/2024   MPG 99.67 01/18/2024   MPG 105.41 05/11/2019   Lab Results  Component Value Date   PROLACTIN 24.1 (H) 05/11/2019   Lab Results  Component Value Date   CHOL 154 01/18/2024   TRIG 180 (H) 01/18/2024   HDL 49 01/18/2024   CHOLHDL 3.1 01/18/2024   VLDL 36 01/18/2024   LDLCALC 69 01/18/2024   LDLCALC 82 05/11/2019    Mental Status exam: Appearance: white female of slightly elevated BMI, fair hygiene, seen in bed, rouses for interview  Eye contact: good  Attitude towards examiner  cooperative, pleasant Psychomotor: no agitation or retardation Speech: reduced amount, infrequent one-word responses  Language: simplistic  Mood: good! Affect: congruent, bright    Thought content: denying SI, HI, not overtly expressing delusional content Thought Process: remains confused and concrete, childlike  Perception: denying AVH, not overtly RTIS Insight: poor  Judgement: poor   Orientation: to self and hospital only - not able to accurately  report date, situation  Attention/Concentration: limited due to cognitive deficits   Memory/Cognition: poor - patient's most recent MOCA on 9/17 was an 8 with significant impairments in attention and memory.  Fund of Knowledge: below average     Musculoskeletal: Strength & Muscle Tone: within normal limits Gait & Station:  normal Patient leans: N/A    Physical Exam Constitutional:      Appearance: the patient is not toxic-appearing.  Pulmonary:     Effort: Pulmonary effort is normal.  Neurological:     General: No focal deficit present.     Oriented to self and hospital only, does not verbalize date or situation HEENT:     Eyes EOMI   Review of Systems  Respiratory:  Negative for shortness of breath.   Cardiovascular:  Negative for chest pain.  Gastrointestinal:  Negative for abdominal pain, constipation, diarrhea, nausea and vomiting.  Neurological:  Negative for headaches.   Blood pressure (!) 107/90, pulse 98, temperature (!) 97.4 F (36.3 C), temperature source Oral, resp. rate 16, height 5' 6 (1.676 m), weight 80.7 kg, SpO2 90%. Body mass index is 28.71 kg/m.   Treatment Plan Summary: Daily contact with patient to assess and evaluate symptoms and progress in treatment and Medication management  Assessment MYRAH STRAWDERMAN is a 53 yr old female who presented on 02/17/24 to [facility] ED with a chief complaint of anxiety. In the ED, she was found to be acutely psychotic with tangential speech, agitation, and delusional thought content. Patient was admitted involuntarily to [facility] on 8/14.  Although substance-induced psychosis has previously been documented, she additional presents with prior reported manic episodes and a bipolar 1 diagnosis has been most reflective of this. On this admission, presented with agitation and increased rate of speech, initially had significantly poor sleep and psychotic symptoms, disorganization of thoughts, behavior, concern for AVH.  After approximately 1 week manic symptoms had largely resolved with antipsychotic medications and mood stabilizers however she remained with disorganization in thoughts and behavior and poor self care initially concerning for ongoing psychosis but after extended observation appears more in line with underlying major neurocognitive  disorder due to multiple etiologies. She has significant cognitive impairments with most recent MOCA scoring at 8 and recommendation for 24 hr supervision and help/prompting ADLs. She has been noted to be childlike in her interactions and requires direction to eat and shower but is otherwise pleasant and cooperative with care with no overt psychotic symptoms. Suspect multifactorial causes of cognitive impairment including longstanding substance use, recurrent manic/psychotic episodes, possible underlying low functioning (unknown).   Over the last 3-4 weeks the patient has been pleasant, denying AVH, somewhat more engaged in exam and with better eye contact. She has not been seen to be RTIS or overtly expressing delusional content. She remains psychiatrically stable. Presentation remains reflective of treated bipolar disorder and ongoing neurocognitive impairments. Patient has been accepted for placement pending bed placement, anticipate discharge approximately 10/20    DSM-5 diagnoses: Bipolar 1 Disorder, most recent episode manic, severe, with psychotic features    -currently resolved: no active symptoms of mania or psychosis, just cognitive deficits 2.  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 History of substance induced psychotic disorder   Plan: Legal status: IVC - renewed 9/30 for an additional 14 days  Psychiatric medications:  -Continue Haldol  10 mg q12 for psychosis and mood stability -Continue Geodon  60 mg bid -Continue Depakote  DR 750 mg q12 for mood stability -Continue Cogentin  1 mg BID for  Drug Induced EPS -Continue Agitation Protocol: Zyprexa    Nicotine  Dependence: -Continue Nicotine  Gum 2 mg PRN  Medical concerns: -UTI with U/A noted resistance to bactrim  -Continue Cipro 500 mg BID x 10 days   -initiated 10/13  Additional PRNs -Continue Zofran  4 mg q8 PRN nausea/vomiting -Continue PRN's: Tylenol , Maalox, Atarax , Milk of Magnesia, Trazodone    --   The risks/benefits/side-effects/alternatives to medications were discussed in detail with the patient and time was given for questions. The patient consents to medication trials.                -- Metabolic profile and EKG monitoring obtained while on an atypical antipsychotic (BMI: 28.73 Lipid Panel: WNL except Trig: 180 HbgA1c: 5.1)              -- Encouraged patient to participate in unit milieu and in scheduled group therapies              -- Short Term Goals: Ability to identify changes in lifestyle to reduce recurrence of condition will improve, Ability to verbalize feelings will improve, Ability to disclose and discuss suicidal ideas, Ability to demonstrate self-control will improve, Ability to identify and develop effective coping behaviors will improve, Ability to maintain clinical measurements within normal limits will improve, Compliance with prescribed medications will improve, and Ability to identify triggers associated with substance abuse/mental health issues will improve             -- Long Term Goals: Improvement in symptoms so as ready for discharge   Safety and Monitoring:             -- Involuntary admission to inpatient psychiatric unit for safety, stabilization and treatment             -- Daily contact with patient to assess and evaluate symptoms and progress in treatment             -- Patient's case to be discussed in multi-disciplinary team meeting             -- Observation Level : q15 minute checks             -- Vital signs:  q12 hours             -- Precautions: suicide, elopement, and assault  Discharge Planning:             -appreciate SW assistance in discharging  APS SW now involved and will be getting the patient a guardian and assisting in finding placement. Level 2 filled out and guardianship hearing was on 10/7 - she now has guardian.   -initial placement fell through, APS SW assisting with referrals to alternative group homes   Leita LOISE Arts,  MD 04/23/2024, 7:50 AM

## 2024-04-24 DIAGNOSIS — F312 Bipolar disorder, current episode manic severe with psychotic features: Secondary | ICD-10-CM | POA: Diagnosis not present

## 2024-04-24 DIAGNOSIS — F0280 Dementia in other diseases classified elsewhere without behavioral disturbance: Secondary | ICD-10-CM | POA: Diagnosis not present

## 2024-04-24 DIAGNOSIS — Z8659 Personal history of other mental and behavioral disorders: Secondary | ICD-10-CM | POA: Diagnosis not present

## 2024-04-24 NOTE — Plan of Care (Signed)
   Problem: Education: Goal: Emotional status will improve Outcome: Progressing Goal: Mental status will improve Outcome: Progressing

## 2024-04-24 NOTE — Progress Notes (Signed)
(  Sleep Hours) -3.25  (Any PRNs that were needed, meds refused, or side effects to meds)- Maalox  (Any disturbances and when (visitation, over night)- none  (Concerns raised by the patient)- heartburn  (SI/HI/AVH)-denies

## 2024-04-24 NOTE — Progress Notes (Signed)
   04/24/24 0605  Vitals  Temp 97.9 F (36.6 C)  BP (!) 87/72  MAP (mmHg) 79  BP Location Right Arm  BP Method Automatic  Patient Position (if appropriate) Sitting  Pulse Rate 78  Resp 16   Pt is asymptomatic. Pt was provided with a cup of Gatorade.

## 2024-04-24 NOTE — Plan of Care (Signed)
   Problem: Education: Goal: Emotional status will improve Outcome: Progressing Goal: Mental status will improve Outcome: Progressing   Problem: Activity: Goal: Sleeping patterns will improve Outcome: Progressing

## 2024-04-24 NOTE — Progress Notes (Signed)
   04/24/24 0800  Psych Admission Type (Psych Patients Only)  Admission Status Involuntary  Psychosocial Assessment  Patient Complaints None  Eye Contact Fair  Facial Expression Animated  Affect Appropriate to circumstance  Speech Soft  Interaction Childlike  Motor Activity Slow  Appearance/Hygiene Disheveled  Behavior Characteristics Cooperative  Mood Pleasant  Thought Process  Coherency Disorganized  Content Preoccupation  Delusions None reported or observed  Perception WDL  Hallucination None reported or observed  Judgment Poor  Confusion None  Danger to Self  Current suicidal ideation? Denies  Danger to Others  Danger to Others None reported or observed

## 2024-04-24 NOTE — Plan of Care (Signed)
   Problem: Safety: Goal: Periods of time without injury will increase Outcome: Progressing

## 2024-04-24 NOTE — Progress Notes (Signed)
 [facility] MD Progress Note  04/24/2024 10:48 AM Allison Bolton  MRN:  [id number]  Principal Problem: Bipolar affective disorder, most recent episode manic with psychotic symptoms (HCC)     Major Neurocognitive Disorder due to multiple etiologies  Diagnosis: Principal Problem:   Bipolar affective disorder, current episode manic with psychotic symptoms (HCC)  Major Neurocognitive Disorder due to multiple etiologies    Total Time spent with patient:  I personally spent 25 minutes on the unit in direct patient care. The direct patient care time included face-to-face time with the patient, reviewing the patient's chart, communicating with other professionals, and coordinating care.   Identifying Information and brief psychiatric history:  Allison Bolton is a 53 yr old female with working psychiatric diagnoses of bipolar 1 disorder and major neurocognitive disorder. 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 She does have a history of prior psychiatric hospitalizations with substance use driving psychotic symptoms, however on this admission has presented with clear manic and psychotic symptoms that cleared with time and medications, however underlying neurocognitive deficits have remained. She has one prior suicide Attempt (2007), and Prior Psychiatric Hospitalizations (last- [facility] 01/2024).  On this admission she presented to [facility] ED on 02/17/24  with a chief complaint of anxiety. In the ED, she was found to be acutely psychotic with tangential speech, agitation, and delusional thought content. Patient was admitted involuntarily to [facility] on 8/14. Since admission the patient has been notably unresponsive to antipsychotic medications, largely related to poor self-care appearing disheveled and requiring direction to do ADLs. Although initially this was concerning for lingering psychotic symptoms, several OT evaluations have noted significant cognitive deficits  and poor self care is now thought to be related to major neurocognitive disorder.   Interval events:  [facility] SW is working on referrals to alternative group homes - to be sent on Monday. Patient has been compliant with care, easily directed, medication compliant and no behavioral concerns. Vitals: BP (!) 87/72 (BP Location: Right Arm)   Pulse 78   Temp 97.9 F (36.6 C)   Resp 16   Ht 5' 6 (1.676 m)   Wt 80.7 kg   SpO2 90%   BMI 28.71 kg/m    Interview today:  Today the patient is seen walking through the milieu. Asks for coat and money so she can leave - advised that we are working on placement for her and she expresses understanding. Otherwise denies concerns including SI, HI or AVH. Reports mood as good. Reports good sleep and appetite.    Past Medical History:  Past Medical History:  Diagnosis Date   Anxiety    Arthritis    Asthma    Bipolar 1 disorder (HCC)    Diverticulosis    Dysrhythmia    sts I have heart palpitations   Fibromyalgia    H/O hiatal hernia    4   Headache(784.0)    High blood pressure    Meniere's disease    S/P colonoscopy    Dr. Golda 2010: few small diverticula at sigmoid. otherwise normal.    Shortness of breath     Past Surgical History:  Procedure Laterality Date   ABDOMINAL HYSTERECTOMY     APPENDECTOMY     CESAREAN SECTION     CHOLECYSTECTOMY     COLONOSCOPY N/A 12/25/2023   Procedure: COLONOSCOPY;  Surgeon: Eartha Angelia Sieving, MD;  Location: [facility];  Service: Gastroenterology;  Laterality: N/A;  11:30am, asa 1   exploratory  laparoscopy     FOOT SURGERY     HEMORRHOID SURGERY     LAPAROSCOPIC APPENDECTOMY  02/19/2011   Procedure: APPENDECTOMY LAPAROSCOPIC;  Surgeon: Oneil DELENA Budge;  Location: [facility] ORS;  Service: General;  Laterality: N/A;   LAPAROSCOPY  02/19/2011   Procedure: LAPAROSCOPY DIAGNOSTIC;  Surgeon: Oneil DELENA Budge;  Location: [facility] ORS;  Service: General;  Laterality: N/A;   left ovarian removal     multiple  hernia repairs     Right ovarian removal     TONSILLECTOMY     TONSILLECTOMY AND ADENOIDECTOMY     tubes in ears     UMBILICAL HERNIA REPAIR  Dec 2011   Dr. Budge   wisdom teeth removal     Family History:  Family History  Problem Relation Age of Onset   Thyroid  disease Mother    Colon cancer Father    Breast cancer Maternal Aunt    Colon cancer Brother    Colon cancer Other        paternal and maternal grandfather   Meniere's disease Other    Anesthesia problems Neg Hx    Hypotension Neg Hx    Malignant hyperthermia Neg Hx    Pseudochol deficiency Neg Hx    Family Psychiatric  History:  None Reported   Social History:  Social History   Substance and Sexual Activity  Alcohol Use No   Comment: not since June 2012     Social History   Substance and Sexual Activity  Drug Use Yes   Types: Marijuana   Comment: patient denies any-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    Social History   Socioeconomic History   Marital status: Legally Separated    Spouse name: Not on file   Number of children: Not on file   Years of education: Not on file   Highest education level: Not on file  Occupational History   Not on file  Tobacco Use   Smoking status: Every Day    Current packs/day: 0.00    Average packs/day: 0.5 packs/day for 37.3 years (18.7 ttl pk-yrs)    Types: Cigarettes    Start date: 07/07/1981    Last attempt to quit: 11/05/2018    Years since quitting: 5.4   Smokeless tobacco: Never  Vaping Use   Vaping status: Never Used  Substance and Sexual Activity   Alcohol use: No    Comment: not since June 2012   Drug use: Yes    Types: Marijuana    Comment: patient denies any-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   Sexual activity: Never    Birth control/protection: Surgical  Other Topics Concern   Not on file  Social History Narrative   Not on file   Social Drivers of Health   Financial Resource Strain: Not on file  Food Insecurity: Food Insecurity  Present (02/18/2024)   Hunger Vital Sign    Worried About Running Out of Food in the Last Year: Sometimes true    Ran Out of Food in the Last Year: Sometimes true  Transportation Needs: Unmet Transportation Needs (02/18/2024)   PRAPARE - Administrator, Civil Service (Medical): Yes    Lack of Transportation (Non-Medical): Yes  Physical Activity: Not on file  Stress: Not on file  Social Connections: Unknown (10/14/2021)   Received from [facility]   Social Connections    Do your friends and family support you?: Not on file  What agencies support you?: Not on file    Current Medications: Current Facility-Administered Medications  Medication Dose Route Frequency Provider Last Rate Last Admin   acetaminophen  (TYLENOL ) tablet 650 mg  650 mg Oral Q6H PRN White, Patrice L, NP   650 mg at 04/23/24 1555   alum & mag hydroxide-simeth (MAALOX/MYLANTA) 200-200-20 MG/5ML suspension 30 mL  30 mL Oral Q4H PRN White, Patrice L, NP   30 mL at 03/24/24 0217   benztropine  (COGENTIN ) tablet 1 mg  1 mg Oral BID White, Patrice L, NP   1 mg at 04/24/24 0806   ciprofloxacin (CIPRO) tablet 500 mg  500 mg Oral BID Pashayan, Alexander S, DO   500 mg at 04/24/24 9193   divalproex  (DEPAKOTE ) DR tablet 750 mg  750 mg Oral Q12H Ji, Andrew, MD   750 mg at 04/24/24 9193   haloperidol  (HALDOL ) tablet 10 mg  10 mg Oral Q12H Ji, Andrew, MD   10 mg at 04/24/24 9193   hydrOXYzine  (ATARAX ) tablet 25 mg  25 mg Oral TID PRN White, Patrice L, NP   25 mg at 04/21/24 2054   magnesium  hydroxide (MILK OF MAGNESIA) suspension 30 mL  30 mL Oral Daily PRN White, Patrice L, NP       nicotine  polacrilex (NICORETTE ) gum 2 mg  2 mg Oral PRN Trudy Carwin, NP   2 mg at 04/23/24 1827   OLANZapine  (ZYPREXA ) injection 10 mg  10 mg Intramuscular TID PRN White, Patrice L, NP       OLANZapine  (ZYPREXA ) injection 5 mg  5 mg Intramuscular TID PRN White, Patrice L, NP       OLANZapine  zydis (ZYPREXA ) disintegrating  tablet 5 mg  5 mg Oral TID PRN White, Patrice L, NP   5 mg at 04/18/24 0103   ondansetron  (ZOFRAN -ODT) disintegrating tablet 4 mg  4 mg Oral Q8H PRN Lynnette Barter, MD   4 mg at 04/02/24 1946   traZODone  (DESYREL ) tablet 50 mg  50 mg Oral QHS White, Patrice L, NP   50 mg at 04/23/24 2040   ziprasidone  (GEODON ) capsule 60 mg  60 mg Oral BID WC Marry Clamp, MD   60 mg at 04/24/24 9193    Lab Results:  No results found for this or any previous visit (from the past 48 hours).   Blood Alcohol level:  Lab Results  Component Value Date   [facility] <15 02/17/2024   ETH <15 01/14/2024    Metabolic Disorder Labs: Lab Results  Component Value Date   HGBA1C 5.1 01/18/2024   MPG 99.67 01/18/2024   MPG 105.41 05/11/2019   Lab Results  Component Value Date   PROLACTIN 24.1 (H) 05/11/2019   Lab Results  Component Value Date   CHOL 154 01/18/2024   TRIG 180 (H) 01/18/2024   HDL 49 01/18/2024   CHOLHDL 3.1 01/18/2024   VLDL 36 01/18/2024   LDLCALC 69 01/18/2024   LDLCALC 82 05/11/2019    Mental Status exam: Appearance: white female of slightly elevated BMI, fair hygiene, seen walking through the milieu Eye contact: good  Attitude towards examiner  cooperative, pleasant Psychomotor: no agitation or retardation Speech: reduced amount, infrequent one-word responses  Language: simplistic  Mood: good Affect: congruent, euthymic    Thought content: denying SI, HI, not overtly expressing delusional content Thought Process: remains confused and concrete, childlike  Perception: denying AVH, not overtly RTIS Insight: poor  Judgement: poor   Orientation: to self and hospital only - not able to  accurately report date, situation  Attention/Concentration: limited due to cognitive deficits   Memory/Cognition: poor - patient's most recent MOCA on 9/17 was an 8 with significant impairments in attention and memory.  Fund of Knowledge: below average     Musculoskeletal: Strength & Muscle Tone:  within normal limits Gait & Station: normal Patient leans: N/A    Physical Exam Constitutional:      Appearance: the patient is not toxic-appearing.  Pulmonary:     Effort: Pulmonary effort is normal.  Neurological:     General: No focal deficit present.     Oriented to self and hospital only, does not verbalize date or situation HEENT:     Eyes EOMI   Review of Systems  Respiratory:  Negative for shortness of breath.   Cardiovascular:  Negative for chest pain.  Gastrointestinal:  Negative for abdominal pain, constipation, diarrhea, nausea and vomiting.  Neurological:  Negative for headaches.   Blood pressure (!) 87/72, pulse 78, temperature 97.9 F (36.6 C), resp. rate 16, height 5' 6 (1.676 m), weight 80.7 kg, SpO2 90%. Body mass index is 28.71 kg/m.   Treatment Plan Summary: Daily contact with patient to assess and evaluate symptoms and progress in treatment and Medication management  Assessment KABAO LEITE is a 53 yr old female who presented on 02/17/24 to [facility] with a chief complaint of anxiety. In the ED, she was found to be acutely psychotic with tangential speech, agitation, and delusional thought content. Patient was admitted involuntarily to [facility] on 8/14.  Although substance-induced psychosis has previously been documented, she additional presents with prior reported manic episodes and a bipolar 1 diagnosis has been most reflective of this. On this admission, presented with agitation and increased rate of speech, initially had significantly poor sleep and psychotic symptoms, disorganization of thoughts, behavior, concern for AVH.  After approximately 1 week manic symptoms had largely resolved with antipsychotic medications and mood stabilizers however she remained with disorganization in thoughts and behavior and poor self care initially concerning for ongoing psychosis but after extended observation appears more in line with underlying major  neurocognitive disorder due to multiple etiologies. She has significant cognitive impairments with most recent MOCA scoring at 8 and recommendation for 24 hr supervision and help/prompting ADLs. She has been noted to be childlike in her interactions and requires direction to eat and shower but is otherwise pleasant and cooperative with care with no overt psychotic symptoms. Suspect multifactorial causes of cognitive impairment including longstanding substance use, recurrent manic/psychotic episodes, possible underlying low functioning (unknown).   Over the last 3-4 weeks the patient has been pleasant, denying AVH, somewhat more engaged in exam and with better eye contact. She has not been seen to be RTIS or overtly expressing delusional content. She remains psychiatrically stable. Presentation remains reflective of treated bipolar disorder and ongoing neurocognitive impairments. Patient has been accepted for placement pending bed placement, anticipate discharge approximately 10/20    DSM-5 diagnoses: Bipolar 1 Disorder, most recent episode manic, severe, with psychotic features    -currently resolved: no active symptoms of mania or psychosis, just cognitive deficits 2.  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 History of substance induced psychotic disorder   Plan: Legal status: IVC - renewed 9/30 for an additional 14 days  Psychiatric medications:  -Continue Haldol  10 mg q12 for psychosis and mood stability -Continue Geodon  60 mg bid -Continue Depakote  DR 750 mg q12 for mood stability -Continue Cogentin  1 mg BID for Drug Induced EPS -  Continue Agitation Protocol: Zyprexa    Nicotine  Dependence: -Continue Nicotine  Gum 2 mg PRN  Medical concerns: -UTI with U/A noted resistance to bactrim  -Continue Cipro 500 mg BID x 10 days   -initiated 10/13  Additional PRNs -Continue Zofran  4 mg q8 PRN nausea/vomiting -Continue PRN's: Tylenol , Maalox, Atarax , Milk of Magnesia,  Trazodone    --  The risks/benefits/side-effects/alternatives to medications were discussed in detail with the patient and time was given for questions. The patient consents to medication trials.                -- Metabolic profile and EKG monitoring obtained while on an atypical antipsychotic (BMI: 28.73 Lipid Panel: WNL except Trig: 180 HbgA1c: 5.1)              -- Encouraged patient to participate in unit milieu and in scheduled group therapies              -- Short Term Goals: Ability to identify changes in lifestyle to reduce recurrence of condition will improve, Ability to verbalize feelings will improve, Ability to disclose and discuss suicidal ideas, Ability to demonstrate self-control will improve, Ability to identify and develop effective coping behaviors will improve, Ability to maintain clinical measurements within normal limits will improve, Compliance with prescribed medications will improve, and Ability to identify triggers associated with substance abuse/mental health issues will improve             -- Long Term Goals: Improvement in symptoms so as ready for discharge   Safety and Monitoring:             -- Involuntary admission to inpatient psychiatric unit for safety, stabilization and treatment             -- Daily contact with patient to assess and evaluate symptoms and progress in treatment             -- Patient's case to be discussed in multi-disciplinary team meeting             -- Observation Level : q15 minute checks             -- Vital signs:  q12 hours             -- Precautions: suicide, elopement, and assault  Discharge Planning:             -appreciate SW assistance in discharging  APS SW now involved and will be getting the patient a guardian and assisting in finding placement. Level 2 filled out and guardianship hearing was on 10/7 - she now has guardian.   -initial placement fell through, APS SW assisting with referrals to alternative group homes   Leita LOISE Arts, MD 04/24/2024, 10:48 AM

## 2024-04-24 NOTE — Progress Notes (Signed)
(  Sleep Hours) - 7.75 (Any PRNs that were needed, meds refused, or side effects to meds)-  None (Any disturbances and when (visitation, over night)- None (Concerns raised by the patient)-  None (SI/HI/AVH)- Denies. Contracts for safety

## 2024-04-25 ENCOUNTER — Encounter (HOSPITAL_COMMUNITY): Payer: Self-pay

## 2024-04-25 DIAGNOSIS — F312 Bipolar disorder, current episode manic severe with psychotic features: Secondary | ICD-10-CM | POA: Diagnosis not present

## 2024-04-25 DIAGNOSIS — F0280 Dementia in other diseases classified elsewhere without behavioral disturbance: Secondary | ICD-10-CM | POA: Diagnosis not present

## 2024-04-25 NOTE — Progress Notes (Signed)
 Provider notified via secure chat. Patient remains safe at this time.  04/25/24 1647  Vital Signs  Pulse Rate 84  Pulse Rate Source Monitor  BP 105/71  BP Method Automatic  Oxygen Therapy  SpO2 98 %
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
## 2024-04-25 NOTE — Group Note (Signed)
 LCSW Group Therapy Note   Group Date: 04/25/2024 Start Time: 1300 End Time: 1350   Type of Therapy and Topic:  Group Therapy: Building Healthy Relationships & Boundaries  Participation Level:  Did Not Attend  Description of Group: This group will address the use of boundaries in their personal lives. Patients will explore why boundaries are important, the difference between healthy and unhealthy boundaries, and negative and postive outcomes of different boundaries and will look at how boundaries can be crossed.  Patients will be encouraged to identify current boundaries in their own lives and identify what kind of boundary is being set. Facilitators will guide patients in utilizing problem-solving interventions to address and correct types boundaries being used and to address when no boundary is being used. Understanding and applying boundaries will be explored and addressed for obtaining and maintaining a balanced life. Patients will be encouraged to explore ways to assertively make their boundaries and needs known to significant others in their lives, using other group members and facilitator for role play, support, and feedback. Participants discussed loneliness, healthy connections, and setting boundaries. They explored safe ways to meet people and shared personal experiences. Key insights were reinforced through discussion and quotes.  Therapeutic Goals:  1.  Patient will identify areas in their life where setting clear boundaries could be  used to improve their life.  2.  Patient will identify signs/triggers that a boundary is not being respected. 3.  Patient will identify two ways to set boundaries in order to achieve balance in  their lives: 4.  Patient will demonstrate ability to communicate their needs and set boundaries  through discussion and/or role plays  Summary of Patient Progress:  Patient did not attend.  Therapeutic Modalities:   Cognitive Behavioral  Therapy Solution-Focused Therapy  Louetta Lame, LCSWA 04/25/2024  4:05 PM

## 2024-04-25 NOTE — Progress Notes (Signed)
   04/25/24 0959  Psych Admission Type (Psych Patients Only)  Admission Status Involuntary  Psychosocial Assessment  Patient Complaints None  Eye Contact Fair  Facial Expression Animated  Affect Appropriate to circumstance  Speech Soft;Slow  Interaction Childlike  Motor Activity Slow  Appearance/Hygiene Disheveled  Behavior Characteristics Cooperative;Restless  Mood Preoccupied;Pleasant  Thought Process  Coherency Disorganized  Content Preoccupation  Delusions None reported or observed  Perception WDL  Hallucination None reported or observed  Judgment Impaired  Confusion Mild  Danger to Self  Current suicidal ideation? Denies  Agreement Not to Harm Self Yes  Description of Agreement Verbal  Danger to Others  Danger to Others None reported or observed

## 2024-04-25 NOTE — Plan of Care (Signed)
 ?  Problem: Education: ?Goal: Mental status will improve ?Outcome: Progressing ?Goal: Verbalization of understanding the information provided will improve ?Outcome: Progressing ?  ?

## 2024-04-25 NOTE — Progress Notes (Signed)
 Gatorade given and provider notified. BP to be rechecked by staff. Patient remains safe at this time.   04/25/24 1632  Vital Signs  Pulse Rate 74  BP (!) 78/53  BP Method Automatic  Oxygen Therapy  SpO2 97 %

## 2024-04-25 NOTE — Group Note (Signed)
 Date:  04/25/2024 Time:  9:18 AM  Group Topic/Focus:  Goals Group:   The focus of this group is to help patients establish daily goals to achieve during treatment and discuss how the patient can incorporate goal setting into their daily lives to aide in recovery.    Participation Level:  Active  Participation Quality:  Appropriate  Affect:  Appropriate  Cognitive:  Disorganized  Insight: Limited  Engagement in Group:  Lacking  Modes of Intervention:  Discussion  Additional Comments:    Allison Bolton [place] 04/25/2024, 9:18 AM

## 2024-04-25 NOTE — Plan of Care (Signed)
   Problem: Education: Goal: Emotional status will improve Outcome: Progressing Goal: Mental status will improve Outcome: Progressing Goal: Verbalization of understanding the information provided will improve Outcome: Progressing

## 2024-04-25 NOTE — Progress Notes (Signed)
 [facility] MD Progress Note  04/25/2024 11:10 AM Allison Bolton  MRN:  [id number]  Principal Problem: Bipolar affective disorder, most recent episode manic with psychotic symptoms (HCC)     Major Neurocognitive Disorder due to multiple etiologies  Diagnosis: Principal Problem:   Bipolar affective disorder, current episode manic with psychotic symptoms (HCC)  Major Neurocognitive Disorder due to multiple etiologies    Total Time spent with patient:  I personally spent 25 minutes on the unit in direct patient care. The direct patient care time included face-to-face time with the patient, reviewing the patient's chart, communicating with other professionals, and coordinating care.   Identifying Information and brief psychiatric history:  Allison Bolton is a 53 yr old female with working psychiatric diagnoses of bipolar 1 disorder and major neurocognitive disorder. 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 She does have a history of prior psychiatric hospitalizations with substance use driving psychotic symptoms, however on this admission has presented with clear manic and psychotic symptoms that cleared with time and medications, however underlying neurocognitive deficits have remained. She has one prior suicide Attempt (2007), and Prior Psychiatric Hospitalizations (last- [facility] 01/2024).  On this admission she presented to [facility] ED on 02/17/24  with a chief complaint of anxiety. In the ED, she was found to be acutely psychotic with tangential speech, agitation, and delusional thought content. Patient was admitted involuntarily to [facility] on 8/14. Since admission the patient has been notably unresponsive to antipsychotic medications, largely related to poor self-care appearing disheveled and requiring direction to do ADLs. Although initially this was concerning for lingering psychotic symptoms, several OT evaluations have noted significant cognitive deficits  and poor self care is now thought to be related to major neurocognitive disorder.   Interval events:  APS SW is working on referrals to alternative group homes - to be sent out today. Patient has been compliant with care, easily directed, medication compliant and no behavioral concerns. Vitals: BP (!) 94/48   Pulse 81   Temp 97.9 F (36.6 C)   Resp 16   Ht 5' 6 (1.676 m)   Wt 80.7 kg   SpO2 100%   BMI 28.71 kg/m    Interview today:  Today the patient is seen walking through the milieu. Asks this dino to fill out a worksheet (unclear) and then later states she would like to do a worksheet in group. Denies all concerns including SI, HI and AVH. Reports good mood and good sleep. She would like to leave the hospital. No other concerns voiced.    Past Medical History:  Past Medical History:  Diagnosis Date   Anxiety    Arthritis    Asthma    Bipolar 1 disorder (HCC)    Diverticulosis    Dysrhythmia    sts I have heart palpitations   Fibromyalgia    H/O hiatal hernia    4   Headache(784.0)    High blood pressure    Meniere's disease    S/P colonoscopy    Dr. Golda 2010: few small diverticula at sigmoid. otherwise normal.    Shortness of breath     Past Surgical History:  Procedure Laterality Date   ABDOMINAL HYSTERECTOMY     APPENDECTOMY     CESAREAN SECTION     CHOLECYSTECTOMY     COLONOSCOPY N/A 12/25/2023   Procedure: COLONOSCOPY;  Surgeon: Eartha Angelia Sieving, MD;  Location: [facility];  Service: Gastroenterology;  Laterality: N/A;  11:30am, asa 1  exploratory laparoscopy     FOOT SURGERY     HEMORRHOID SURGERY     LAPAROSCOPIC APPENDECTOMY  02/19/2011   Procedure: APPENDECTOMY LAPAROSCOPIC;  Surgeon: Oneil DELENA Budge;  Location: [facility] ORS;  Service: General;  Laterality: N/A;   LAPAROSCOPY  02/19/2011   Procedure: LAPAROSCOPY DIAGNOSTIC;  Surgeon: Oneil DELENA Budge;  Location: [facility] ORS;  Service: General;  Laterality: N/A;   left ovarian removal     multiple  hernia repairs     Right ovarian removal     TONSILLECTOMY     TONSILLECTOMY AND ADENOIDECTOMY     tubes in ears     UMBILICAL HERNIA REPAIR  Dec 2011   Dr. Budge   wisdom teeth removal     Family History:  Family History  Problem Relation Age of Onset   Thyroid  disease Mother    Colon cancer Father    Breast cancer Maternal Aunt    Colon cancer Brother    Colon cancer Other        paternal and maternal grandfather   Meniere's disease Other    Anesthesia problems Neg Hx    Hypotension Neg Hx    Malignant hyperthermia Neg Hx    Pseudochol deficiency Neg Hx    Family Psychiatric  History:  None Reported   Social History:  Social History   Substance and Sexual Activity  Alcohol Use No   Comment: not since June 2012     Social History   Substance and Sexual Activity  Drug Use Yes   Types: Marijuana   Comment: patient denies any-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    Social History   Socioeconomic History   Marital status: Legally Separated    Spouse name: Not on file   Number of children: Not on file   Years of education: Not on file   Highest education level: Not on file  Occupational History   Not on file  Tobacco Use   Smoking status: Every Day    Current packs/day: 0.00    Average packs/day: 0.5 packs/day for 37.3 years (18.7 ttl pk-yrs)    Types: Cigarettes    Start date: 07/07/1981    Last attempt to quit: 11/05/2018    Years since quitting: 5.4   Smokeless tobacco: Never  Vaping Use   Vaping status: Never Used  Substance and Sexual Activity   Alcohol use: No    Comment: not since June 2012   Drug use: Yes    Types: Marijuana    Comment: patient denies any-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   Sexual activity: Never    Birth control/protection: Surgical  Other Topics Concern   Not on file  Social History Narrative   Not on file   Social Drivers of Health   Financial Resource Strain: Not on file  Food Insecurity: Food Insecurity  Present (02/18/2024)   Hunger Vital Sign    Worried About Running Out of Food in the Last Year: Sometimes true    Ran Out of Food in the Last Year: Sometimes true  Transportation Needs: Unmet Transportation Needs (02/18/2024)   PRAPARE - Administrator, Civil Service (Medical): Yes    Lack of Transportation (Non-Medical): Yes  Physical Activity: Not on file  Stress: Not on file  Social Connections: Unknown (10/14/2021)   Received from [facility]   Social Connections    Do your friends and family support you?: Not on file  What agencies support you?: Not on file    Current Medications: Current Facility-Administered Medications  Medication Dose Route Frequency Provider Last Rate Last Admin   acetaminophen  (TYLENOL ) tablet 650 mg  650 mg Oral Q6H PRN White, Patrice L, NP   650 mg at 04/23/24 1555   alum & mag hydroxide-simeth (MAALOX/MYLANTA) 200-200-20 MG/5ML suspension 30 mL  30 mL Oral Q4H PRN White, Patrice L, NP   30 mL at 04/24/24 2252   benztropine  (COGENTIN ) tablet 1 mg  1 mg Oral BID White, Patrice L, NP   1 mg at 04/25/24 0857   ciprofloxacin (CIPRO) tablet 500 mg  500 mg Oral BID Pashayan, Alexander S, DO   500 mg at 04/25/24 9142   divalproex  (DEPAKOTE ) DR tablet 750 mg  750 mg Oral Q12H Ji, Andrew, MD   750 mg at 04/25/24 9144   haloperidol  (HALDOL ) tablet 10 mg  10 mg Oral Q12H Ji, Andrew, MD   10 mg at 04/25/24 9142   hydrOXYzine  (ATARAX ) tablet 25 mg  25 mg Oral TID PRN White, Patrice L, NP   25 mg at 04/21/24 2054   magnesium  hydroxide (MILK OF MAGNESIA) suspension 30 mL  30 mL Oral Daily PRN White, Patrice L, NP       nicotine  polacrilex (NICORETTE ) gum 2 mg  2 mg Oral PRN Trudy Carwin, NP   2 mg at 04/23/24 1827   OLANZapine  (ZYPREXA ) injection 10 mg  10 mg Intramuscular TID PRN White, Patrice L, NP       OLANZapine  (ZYPREXA ) injection 5 mg  5 mg Intramuscular TID PRN White, Patrice L, NP       OLANZapine  zydis (ZYPREXA ) disintegrating  tablet 5 mg  5 mg Oral TID PRN White, Patrice L, NP   5 mg at 04/18/24 0103   ondansetron  (ZOFRAN -ODT) disintegrating tablet 4 mg  4 mg Oral Q8H PRN Lynnette Barter, MD   4 mg at 04/02/24 1946   traZODone  (DESYREL ) tablet 50 mg  50 mg Oral QHS White, Patrice L, NP   50 mg at 04/24/24 2109   ziprasidone  (GEODON ) capsule 60 mg  60 mg Oral BID WC Marry Clamp, MD   60 mg at 04/25/24 9144    Lab Results:  No results found for this or any previous visit (from the past 48 hours).   Blood Alcohol level:  Lab Results  Component Value Date   [facility] <15 02/17/2024   ETH <15 01/14/2024    Metabolic Disorder Labs: Lab Results  Component Value Date   HGBA1C 5.1 01/18/2024   MPG 99.67 01/18/2024   MPG 105.41 05/11/2019   Lab Results  Component Value Date   PROLACTIN 24.1 (H) 05/11/2019   Lab Results  Component Value Date   CHOL 154 01/18/2024   TRIG 180 (H) 01/18/2024   HDL 49 01/18/2024   CHOLHDL 3.1 01/18/2024   VLDL 36 01/18/2024   LDLCALC 69 01/18/2024   LDLCALC 82 05/11/2019    Mental Status exam: Appearance: white female of slightly elevated BMI, fair hygiene, seen walking through the milieu Eye contact: good  Attitude towards examiner  cooperative, pleasant Psychomotor: no agitation or retardation Speech: reduced amount, infrequent one-word responses  Language: simplistic  Mood: good Affect: congruent, euthymic    Thought content: denying SI, HI, not overtly expressing delusional content Thought Process: remains confused and concrete, childlike  Perception: denying AVH, not overtly RTIS Insight: poor  Judgement: poor   Orientation: to self and hospital only - not able to  accurately report date, situation  Attention/Concentration: limited due to cognitive deficits   Memory/Cognition: poor - patient's most recent MOCA on 9/17 was an 8 with significant impairments in attention and memory.  Fund of Knowledge: below average     Musculoskeletal: Strength & Muscle Tone:  within normal limits Gait & Station: normal Patient leans: N/A    Physical Exam Constitutional:      Appearance: the patient is not toxic-appearing.  Pulmonary:     Effort: Pulmonary effort is normal.  Neurological:     General: No focal deficit present.     Oriented to self and hospital only, does not verbalize date or situation HEENT:     Eyes EOMI   Review of Systems  Respiratory:  Negative for shortness of breath.   Cardiovascular:  Negative for chest pain.  Gastrointestinal:  Negative for abdominal pain, constipation, diarrhea, nausea and vomiting.  Neurological:  Negative for headaches.   Blood pressure (!) 94/48, pulse 81, temperature 97.9 F (36.6 C), resp. rate 16, height 5' 6 (1.676 m), weight 80.7 kg, SpO2 100%. Body mass index is 28.71 kg/m.   Treatment Plan Summary: Daily contact with patient to assess and evaluate symptoms and progress in treatment and Medication management  Assessment Allison Bolton is a 53 yr old female who presented on 02/17/24 to [facility] ED with a chief complaint of anxiety. In the ED, she was found to be acutely psychotic with tangential speech, agitation, and delusional thought content. Patient was admitted involuntarily to [facility] on 8/14.  Although substance-induced psychosis has previously been documented, she additional presents with prior reported manic episodes and a bipolar 1 diagnosis has been most reflective of this. On this admission, presented with agitation and increased rate of speech, initially had significantly poor sleep and psychotic symptoms, disorganization of thoughts, behavior, concern for AVH.  After approximately 1 week manic symptoms had largely resolved with antipsychotic medications and mood stabilizers however she remained with disorganization in thoughts and behavior and poor self care initially concerning for ongoing psychosis but after extended observation appears more in line with underlying major  neurocognitive disorder due to multiple etiologies. She has significant cognitive impairments with most recent MOCA scoring at 8 and recommendation for 24 hr supervision and help/prompting ADLs. She has been noted to be childlike in her interactions and requires direction to eat and shower but is otherwise pleasant and cooperative with care with no overt psychotic symptoms. Suspect multifactorial causes of cognitive impairment including longstanding substance use, recurrent manic/psychotic episodes, possible underlying low functioning (unknown).   Over the last 3-4 weeks the patient has been pleasant, denying AVH, somewhat more engaged in exam and with better eye contact. She has not been seen to be RTIS or overtly expressing delusional content. She remains psychiatrically stable. Presentation remains reflective of treated bipolar disorder and ongoing neurocognitive impairments. Patient has been accepted for placement pending bed placement, anticipate discharge approximately 10/20    DSM-5 diagnoses: Bipolar 1 Disorder, most recent episode manic, severe, with psychotic features    -currently resolved: no active symptoms of mania or psychosis, just cognitive deficits 2.  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 History of substance induced psychotic disorder   Plan: Legal status: IVC - renewed 9/30 for an additional 14 days  Psychiatric medications:  -Continue Haldol  10 mg q12 for psychosis and mood stability -Continue Geodon  60 mg bid -Continue Depakote  DR 750 mg q12 for mood stability -Continue Cogentin  1 mg BID for Drug Induced EPS -  Continue Agitation Protocol: Zyprexa    Nicotine  Dependence: -Continue Nicotine  Gum 2 mg PRN  Medical concerns: -UTI with U/A noted resistance to bactrim  -Continue Cipro 500 mg BID x 10 days   -initiated 10/13  Additional PRNs -Continue Zofran  4 mg q8 PRN nausea/vomiting -Continue PRN's: Tylenol , Maalox, Atarax , Milk of Magnesia,  Trazodone    --  The risks/benefits/side-effects/alternatives to medications were discussed in detail with the patient and time was given for questions. The patient consents to medication trials.                -- Metabolic profile and EKG monitoring obtained while on an atypical antipsychotic (BMI: 28.73 Lipid Panel: WNL except Trig: 180 HbgA1c: 5.1)              -- Encouraged patient to participate in unit milieu and in scheduled group therapies              -- Short Term Goals: Ability to identify changes in lifestyle to reduce recurrence of condition will improve, Ability to verbalize feelings will improve, Ability to disclose and discuss suicidal ideas, Ability to demonstrate self-control will improve, Ability to identify and develop effective coping behaviors will improve, Ability to maintain clinical measurements within normal limits will improve, Compliance with prescribed medications will improve, and Ability to identify triggers associated with substance abuse/mental health issues will improve             -- Long Term Goals: Improvement in symptoms so as ready for discharge   Safety and Monitoring:             -- Involuntary admission to inpatient psychiatric unit for safety, stabilization and treatment             -- Daily contact with patient to assess and evaluate symptoms and progress in treatment             -- Patient's case to be discussed in multi-disciplinary team meeting             -- Observation Level : q15 minute checks             -- Vital signs:  q12 hours             -- Precautions: suicide, elopement, and assault  Discharge Planning:             -appreciate SW assistance in discharging  APS SW now involved and will be getting the patient a guardian and assisting in finding placement. Level 2 filled out and guardianship hearing was on 10/7 - she now has guardian.   -initial placement fell through, APS SW assisting with referrals to alternative group homes   Allison LOISE Arts, MD 04/25/2024, 11:10 AM

## 2024-04-25 NOTE — Progress Notes (Signed)
(  Sleep Hours) -13  (Any PRNs that were needed, meds refused, or side effects to meds)- Maalox  (Any disturbances and when (visitation, over night)-none  (Concerns raised by the patient)- none  (SI/HI/AVH)-denies

## 2024-04-25 NOTE — Group Note (Signed)
 Recreation Therapy Group Note   Group Topic:General Recreation  Group Date: 04/25/2024 Start Time: 1010 End Time: 1030 Facilitators: Ahmarion Saraceno-McCall, LRT,CTRS Location: [address]   Group Topic/Focus: General Recreation   Goal Area(s) Addresses:  Patient will use appropriate interactions with peers.   Patient will follow directions.  Behavioral Response: Appropriate   Intervention: Art and Movies  Activity: Patient with peers allowed  the opportunity to engage in art activities or watching a movie during recreation therapy group session today.    Affect/Mood: Appropriate   Participation Level: Active   Participation Quality: Independent   Behavior: Appropriate   Speech/Thought Process: Relevant   Insight: Fair   Judgement: Fair    Modes of Intervention: Socialization   Patient Response to Interventions:  Receptive   Education Outcome:  In group clarification offered    Clinical Observations/Individualized Feedback: Pt was bright and smiling as always. Pt had some interaction with peers. Pt also engaged in watching the movie as well. Pt was appropriate during group.    Plan: Continue to engage patient in RT group sessions 2-3x/week.   Alina Gilkey-McCall, LRT,CTRS 04/25/2024 12:58 PM

## 2024-04-26 DIAGNOSIS — F1994 Other psychoactive substance use, unspecified with psychoactive substance-induced mood disorder: Secondary | ICD-10-CM

## 2024-04-26 NOTE — Plan of Care (Signed)
  Problem: Education: Goal: Mental status will improve Outcome: Progressing   Problem: Activity: Goal: Interest or engagement in activities will improve Outcome: Progressing Goal: Sleeping patterns will improve Outcome: Progressing

## 2024-04-26 NOTE — Group Note (Signed)
 Date:  04/26/2024 Time:  11:31 AM  Group Topic/Focus:  Pet Therapy:   This group aims to reduce stress and anxiety, improve mood, and increase feelings of comfort using animals.   Participation Level:  Did Not Attend    Allison Bolton 04/26/2024, 11:31 AM

## 2024-04-26 NOTE — Progress Notes (Signed)
(  Sleep Hours) -11.5  (Any PRNs that were needed, meds refused, or side effects to meds)- none  (Any disturbances and when (visitation, over night)- none  (Concerns raised by the patient)- none  (SI/HI/AVH)-denies

## 2024-04-26 NOTE — Group Note (Addendum)
 Recreation Therapy Group Note   Group Topic:Coping Skills  Group Date: 04/26/2024 Start Time: 1055 End Time: 1115 Facilitators: Joshlyn Beadle-McCall, LRT,CTRS Location: [address] Dayroom   Group Topic: Coping Skills   Goal Area(s) Addresses: Patient will define what a coping skill is. Patient will work with to create a list of healthy coping skills beginning with each letter of the alphabet. Patient will successfully identify positive coping skills they can use post d/c.  Patient will acknowledge benefit(s) of using learned coping skills post d/c.  Behavioral Response: Attentive   Intervention: Group work   Activity: Coping A to Z. Patient asked to identify what a coping skill is and when they use them. Patients with Clinical research associate discussed healthy versus unhealthy coping skills. Next patients were given a blank worksheet titled Coping Skills A-Z. Patients were instructed to come up with at least one positive coping skill per letter of the alphabet. Patients were given 15 minutes to brainstorm before ideas were presented to the large group. Patients and LRT debriefed on the importance of coping skill selection based on situation and back-up plans when a skill tried is not effective. At the end of group, patients were given an handout of alphabetized strategies to keep for future reference.   Education: Pharmacologist, Scientist, physiological, Discharge Planning.    Education Outcome: Acknowledges education/Verbalizes understanding/In group clarification offered/Additional education needed    Affect/Mood: Happy   Participation Level: Minimal   Participation Quality: Independent   Behavior: Appropriate   Speech/Thought Process: Disorganized   Insight: Limited   Judgement: Limited   Modes of Intervention: Worksheet   Patient Response to Interventions:  Receptive   Education Outcome:  In group clarification offered    Clinical Observations/Individualized Feedback: Pt came into  group for the last portion. Pt was rambling about wanting something to eat and various other topics. Pt stated eating was her coping skill before randomly talking about other things. Pt was bright while in group.     Plan: Continue to engage patient in RT group sessions 2-3x/week.   Marikay Roads-McCall, LRT,CTRS  04/26/2024 1:14 PM

## 2024-04-26 NOTE — Progress Notes (Signed)
 [facility] MD Progress Note  04/26/2024 12:51 PM Allison Bolton  MRN:  [id number] Subjective:   Allison Bolton is a 53 yr old female who presented on 02/17/24 to [facility] ED with a chief complaint of anxiety. In the ED, she was found to be acutely psychotic with tangential speech, agitation, and delusional thought content. Patient was admitted involuntarily to [facility] on 8/14.  PPHx is significant for Substance-Induced Psychosis, Bipolar I Disorder, Stimulant Use Disorder, GAD, Suicide Attempt (2007), and Prior Psychiatric Hospitalizations (last- [facility] 01/2024).   Case was discussed in the multidisciplinary team. MAR was reviewed and patient was compliant with medications.  She received PRN Maalox yesterday.   Psychiatric Team made the following recommendations yesterday: -Continue Haldol  10 mg q12 for psychosis and mood stability -Continue Geodon  60 mg BID for psychosis and mood stability -Continue Depakote  DR 750 mg q12 for mood stability -Continue Cogentin  1 mg BID for Drug Induced EPS -Continue Cipro 500 mg BID for 10 days (Day 8 of 10)    On interview today patient reports she slept good last night.  She reports her appetite is doing good.  She reports no SI, HI, or AVH.  She reports no Paranoia or Ideas of Reference.  She reports no issues with her medications.  She reports no urinary symptoms and that she is tolerating the antibiotic.  Discussed with her that we are approaching the end of the antibiotic course.  Discussed with her we are still awaiting bed placement and she reports understanding.  She reports no other concerns at present. .  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 The direct patient care time included face-to-face time with the patient, reviewing the patient's chart, communicating with other professionals, and coordinating care.    Past Psychiatric History:  Substance-Induced Psychosis, Bipolar I Disorder, Stimulant Use Disorder, GAD, Suicide Attempt (2007), and Prior Psychiatric Hospitalizations (last- [facility] 01/2024).  Past Medical History:  Past Medical History:  Diagnosis Date   Anxiety    Arthritis    Asthma    Bipolar 1 disorder (HCC)    Diverticulosis    Dysrhythmia    sts I have heart palpitations   Fibromyalgia    H/O hiatal hernia    4   Headache(784.0)    High blood pressure    Meniere's disease    S/P colonoscopy    Dr. Golda 2010: few small diverticula at sigmoid. otherwise normal.    Shortness of breath     Past Surgical History:  Procedure Laterality Date   ABDOMINAL HYSTERECTOMY     APPENDECTOMY     CESAREAN SECTION     CHOLECYSTECTOMY     COLONOSCOPY N/A 12/25/2023   Procedure: COLONOSCOPY;  Surgeon: Eartha Angelia Sieving, MD;  Location: [facility] END[facility];  Service: Gastroenterology;  Laterality: N/A;  11:30am, asa 1   exploratory laparoscopy     FOOT SURGERY     HEMORRHOID SURGERY     LAPAROSCOPIC APPENDECTOMY  02/19/2011   Procedure: APPENDECTOMY LAPAROSCOPIC;  Surgeon: Oneil DELENA Budge;  Location: [facility] ORS;  Service: General;  Laterality: N/A;   LAPAROSCOPY  02/19/2011   Procedure: LAPAROSCOPY DIAGNOSTIC;  Surgeon: Oneil DELENA Budge;  Location: [facility] ORS;  Service: General;  Laterality: N/A;   left ovarian removal     multiple hernia repairs     Right ovarian removal     TONSILLECTOMY     TONSILLECTOMY AND ADENOIDECTOMY     tubes in ears     UMBILICAL HERNIA REPAIR  Dec 2011   Dr. Mavis   wisdom teeth removal     Family History:  Family History  Problem Relation Age of Onset   Thyroid  disease Mother    Colon cancer Father    Breast cancer Maternal Aunt    Colon  cancer Brother    Colon cancer Other        paternal and maternal grandfather   Meniere's disease Other    Anesthesia problems Neg Hx    Hypotension Neg Hx    Malignant hyperthermia Neg Hx    Pseudochol deficiency Neg Hx    Family Psychiatric  History:  None Reported   Social History:  Social History   Substance and Sexual Activity  Alcohol Use No   Comment: not since June 2012     Social History   Substance and Sexual Activity  Drug Use Yes   Types: Marijuana   Comment: patient denies any-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    Social History   Socioeconomic History   Marital status: Legally Separated    Spouse name: Not on file   Number of children: Not on file   Years of education: Not on file   Highest education level: Not on file  Occupational History   Not on file  Tobacco Use   Smoking status: Every Day    Current packs/day: 0.00    Average packs/day: 0.5 packs/day for 37.3 years (18.7 ttl pk-yrs)    Types: Cigarettes    Start date: 07/07/1981    Last attempt to quit: 11/05/2018    Years since quitting: 5.4   Smokeless tobacco: Never  Vaping Use   Vaping status: Never Used  Substance and Sexual Activity   Alcohol use: No    Comment: not since June 2012   Drug use: Yes    Types: Marijuana    Comment: patient denies any-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   Sexual activity: Never    Birth control/protection: Surgical  Other Topics Concern   Not on file  Social History Narrative   Not on file   Social Drivers of Health   Financial Resource Strain: Not on file  Food Insecurity: Food Insecurity Present (02/18/2024)   Hunger Vital Sign    Worried About Running Out of Food in the Last Year: Sometimes true    Ran Out of Food in the Last Year: Sometimes true  Transportation Needs: Unmet Transportation Needs (02/18/2024)   PRAPARE - Administrator, Civil Service (Medical): Yes    Lack of Transportation (Non-Medical): Yes  Physical  Activity: Not on file  Stress: Not on file  Social Connections: Unknown (10/14/2021)   [facility]   Social Connections    Do your friends and family support you?: Not on file    What agencies support you?: Not on file   Additional Social History:                         Sleep: Good Estimated Sleeping Duration (Last 24 Hours): 9.50-11.50 hours  Appetite:  Good  Current Medications: Current Facility-Administered Medications  Medication Dose  Route Frequency Provider Last Rate Last Admin   acetaminophen  (TYLENOL ) tablet 650 mg  650 mg Oral Q6H PRN White, Patrice L, NP   650 mg at 04/23/24 1555   alum & mag hydroxide-simeth (MAALOX/MYLANTA) 200-200-20 MG/5ML suspension 30 mL  30 mL Oral Q4H PRN White, Patrice L, NP   30 mL at 04/25/24 2207   benztropine  (COGENTIN ) tablet 1 mg  1 mg Oral BID White, Patrice L, NP   1 mg at 04/26/24 1138   ciprofloxacin (CIPRO) tablet 500 mg  500 mg Oral BID Taquilla Downum S, DO   500 mg at 04/26/24 1137   divalproex  (DEPAKOTE ) DR tablet 750 mg  750 mg Oral Q12H Ji, Andrew, MD   750 mg at 04/26/24 1138   haloperidol  (HALDOL ) tablet 10 mg  10 mg Oral Q12H Ji, Andrew, MD   10 mg at 04/26/24 1137   hydrOXYzine  (ATARAX ) tablet 25 mg  25 mg Oral TID PRN White, Patrice L, NP   25 mg at 04/21/24 2054   magnesium  hydroxide (MILK OF MAGNESIA) suspension 30 mL  30 mL Oral Daily PRN White, Patrice L, NP       nicotine  polacrilex (NICORETTE ) gum 2 mg  2 mg Oral PRN Trudy Carwin, NP   2 mg at 04/23/24 1827   OLANZapine  (ZYPREXA ) injection 10 mg  10 mg Intramuscular TID PRN White, Patrice L, NP       OLANZapine  (ZYPREXA ) injection 5 mg  5 mg Intramuscular TID PRN White, Patrice L, NP       OLANZapine  zydis (ZYPREXA ) disintegrating tablet 5 mg  5 mg Oral TID PRN White, Patrice L, NP   5 mg at 04/18/24 0103   ondansetron  (ZOFRAN -ODT) disintegrating tablet 4 mg  4 mg Oral Q8H PRN Lynnette Barter, MD   4 mg at 04/02/24 1946    traZODone  (DESYREL ) tablet 50 mg  50 mg Oral QHS White, Patrice L, NP   50 mg at 04/25/24 2040   ziprasidone  (GEODON ) capsule 60 mg  60 mg Oral BID WC Marry Clamp, MD   60 mg at 04/26/24 1137    Lab Results:  No results found for this or any previous visit (from the past 48 hours).     Blood Alcohol level:  Lab Results  Component Value Date   [facility] - B <15 02/17/2024   ETH <15 01/14/2024    Metabolic Disorder Labs: Lab Results  Component Value Date   HGBA1C 5.1 01/18/2024   MPG 99.67 01/18/2024   MPG 105.41 05/11/2019   Lab Results  Component Value Date   PROLACTIN 24.1 (H) 05/11/2019   Lab Results  Component Value Date   CHOL 154 01/18/2024   TRIG 180 (H) 01/18/2024   HDL 49 01/18/2024   CHOLHDL 3.1 01/18/2024   VLDL 36 01/18/2024   LDLCALC 69 01/18/2024   LDLCALC 82 05/11/2019    Physical Findings: AIMS:  ,  ,  ,  ,  ,  ,   CIWA:    COWS:     Musculoskeletal: Strength & Muscle Tone: within normal limits Gait & Station: normal Patient leans: N/A  Psychiatric Specialty Exam:  Presentation  General Appearance:  Casual  Eye Contact: Good  Speech: Clear and Coherent; Normal Rate  Speech Volume: Normal  Handedness:No data recorded  Mood and Affect  Mood: Euthymic  Affect: Other (comment) (smiling)   Thought Process  Thought Processes: Linear  Descriptions of Associations:Loose  Orientation:Partial  Thought Content:Scattered  History of Schizophrenia/Schizoaffective disorder:No  Duration  of Psychotic Symptoms:Greater than six months  Hallucinations:Hallucinations: None   Ideas of Reference:None  Suicidal Thoughts:Suicidal Thoughts: No   Homicidal Thoughts:Homicidal Thoughts: No    Sensorium  Memory: Immediate Poor; Recent Poor  Judgment: Impaired  Insight: Shallow   Executive Functions  Concentration: Fair  Attention Span: Fair  Recall: Poor  Fund of Knowledge: Poor  Language: Fair   Psychomotor  Activity  Psychomotor Activity:Psychomotor Activity: Normal    Assets  Assets: Resilience   Sleep  Sleep:Sleep: Good     Physical Exam: Physical Exam Vitals and nursing note reviewed.  Constitutional:      General: She is not in acute distress.    Appearance: Normal appearance. She is normal weight. She is not ill-appearing or toxic-appearing.  HENT:     Head: Normocephalic and atraumatic.  Pulmonary:     Effort: Pulmonary effort is normal.  Musculoskeletal:        General: Normal range of motion.  Neurological:     General: No focal deficit present.     Mental Status: She is alert.    Review of Systems  Respiratory:  Negative for cough and shortness of breath.   Cardiovascular:  Negative for chest pain.  Gastrointestinal:  Negative for abdominal pain, constipation, diarrhea, nausea and vomiting.  Neurological:  Negative for dizziness, weakness and headaches.  Psychiatric/Behavioral:  Negative for depression, hallucinations and suicidal ideas. The patient is not nervous/anxious.    Blood pressure (!) 90/52, pulse 84, temperature 97.9 F (36.6 C), resp. rate 16, height 5' 6 (1.676 m), weight 80.7 kg, SpO2 98%. Body mass index is 28.71 kg/m.   Treatment Plan Summary: Daily contact with patient to assess and evaluate symptoms and progress in treatment and Medication management  Allison Bolton is a 53 yr old female who presented on 02/17/24 to [facility] ED with a chief complaint of anxiety. In the ED, she was found to be acutely psychotic with tangential speech, agitation, and delusional thought content. Patient was admitted involuntarily to [facility] on 8/14.  PPHx is significant for Substance-Induced Psychosis, Bipolar I Disorder, Stimulant Use Disorder, GAD, Suicide Attempt (2007), and Prior Psychiatric Hospitalizations (last- [facility] 01/2024).   Tehilla remains stable from a psychiatric standpoint.  We continue to await a bed at a group home.  She has been doing well  with the Cipro and is nearing completion of the antibiotic course.  We will not make any changes to her medications at this time.  We will continue to monitor.    Substance-induced psychosis  Bipolar disorder: -Continue Haldol  10 mg q12 for psychosis and mood stability -Continue Geodon  60 mg BID for psychosis and mood stability -Continue Depakote  DR 750 mg q12 for mood stability -Continue Cogentin  1 mg BID for Drug Induced EPS -Continue Agitation Protocol: Zyprexa    Dementia in other diseases classified elsewhere, unspecified severity, without behavioral disturbance, psychotic disturbance, mood disturbance, and anxiety: Dalary was given a MOCA on 9/17 and scored an 8.  APS SW is seeking a guardian for the patient as she needs 24 hour assistance at this point and they are looking for placement at either SNF or group home.     Nicotine  Dependence: -Continue Nicotine  Gum 2 mg PRN   UTI: -UA shows E. Coli resistant to Bactrim  -Continue Cipro 500 mg BID for 10 days (Day 9 of 10)   -Continue Zofran  4 mg q8 PRN nausea/vomiting -Continue PRN's: Tylenol , Maalox, Atarax , Milk of Magnesia, Trazodone    --  The risks/benefits/side-effects/alternatives to medications  were discussed in detail with the patient and time was given for questions. The patient consents to medication trials.                -- Metabolic profile and EKG monitoring obtained while on an atypical antipsychotic (BMI: 28.73 Lipid Panel: WNL except Trig: 180 HbgA1c: 5.1)              -- Encouraged patient to participate in unit milieu and in scheduled group therapies              -- Short Term Goals: Ability to identify changes in lifestyle to reduce recurrence of condition will improve, Ability to verbalize feelings will improve, Ability to disclose and discuss suicidal ideas, Ability to demonstrate self-control will improve, Ability to identify and develop effective coping behaviors will improve, Ability to maintain clinical  measurements within normal limits will improve, Compliance with prescribed medications will improve, and Ability to identify triggers associated with substance abuse/mental health issues will improve             -- Long Term Goals: Improvement in symptoms so as ready for discharge   Safety and Monitoring:             -- Involuntary admission to inpatient psychiatric unit for safety, stabilization and treatment             -- Daily contact with patient to assess and evaluate symptoms and progress in treatment             -- Patient's case to be discussed in multi-disciplinary team meeting             -- Observation Level : q15 minute checks             -- Vital signs:  q12 hours             -- Precautions: suicide, elopement, and assault  Discharge Planning:              -- Social work and case management to assist with discharge planning and identification of hospital follow-up needs prior to discharge             -- Estimated LOS: 7+ more days             -- Discharge Concerns: Need to establish a safety plan; Medication compliance and effectiveness             -- Discharge Goals: Return home with outpatient referrals for mental health follow-up including medication management/psychotherapy   Marsa GORMAN Rosser, DO 04/26/2024, 12:51 PM

## 2024-04-26 NOTE — Progress Notes (Signed)
 Patient denies SI, HI and AVH this shift Patient has exhibited no behavioral dsycontrol this shift. Patient has attended groups, engaged in 1:1 staff talks, and been medication compliant.  Assess patient's safety, offer medications as prescribed, engage patient in 1:1 staff talks.  Continue to monitor as planned. Patient able to contract for safety.

## 2024-04-26 NOTE — BHH Group Notes (Signed)
 Adult Psychoeducational Group Note  Date:  04/26/2024 Time:  8:48 PM  Group Topic/Focus:  Wrap-Up Group:   The focus of this group is to help patients review their daily goal of treatment and discuss progress on daily workbooks.  Participation Level:  Minimal  Additional Comments:  Pt initially attended the evening wrap-up group, but left several times and was evasive when asked discussion prompts from the Writer.  Aisha Celestine [name] 04/26/2024, 8:48 PM

## 2024-04-26 NOTE — Progress Notes (Signed)
 [facility]   Type of Note: Update from APS  Called and spoke with pt's social worker through [facility] DSS/APS Izetta Roses, [phone].   Katie reports we cannot pursue placement with any [place] group homes because one of the owner's homes was shut down by the state due to fraud/legal reasons. DSS is unable to affiliate a placement where a home was shut down by the state. Izetta was just made aware of this.   Izetta is awaiting her supervisors signature on group home paperwork for [facility] and [facility] before they are submitted.   Katie spoke with the adult home specialist on their team who is looking as well for female mental health beds.  Signed:  Sahand Gosch, LCSW-A 04/26/2024  1:19 PM

## 2024-04-26 NOTE — Progress Notes (Addendum)
 [facility]   Type of Note: Second update from APS  Received a call from [place], [phone] this afternoon.  Katie reports [facility] through [facility] has a female bed open and they may be interested in accepting pt. Katie requested this Clinical research associate reach out to [facility] at this facility [phone] to arrange a virtual meeting with pt tomorrow.   Attempted to contact Amber at number above with no response. HIPAA compliant vm left.   Called and spoke with Izetta who reports she will fax over pt's completed application tomorrow morning.   3:40 PM: Received call back from [employer]. A virtual interview has been set up for tomorrow 04/27/24 at 11:00AM (~30 minutes). Home is located in [place].   Amber is requesting H&P and updated progress notes to be sent via secure email ([email or web address]) this afternoon to review prior to meeting tomorrow. Confirmed with Amber that pt has [facility]. Amber inquiring if [phone] services have already been approved through case Production designer, theatre/television/film with [facility]. Reports if not, assessment needs to be completed prior to discharge to ensure pt can participate in activities at the home. Does not need paperwork to come in the mail, only needs verbal confirmation from [facility] case manager and/or [employer] that she is approved.  H&P and notes to be sent this afternoon.  Signed:  Kaleia Longhi, LCSW-A 04/26/2024  3:26 PM

## 2024-04-27 NOTE — Progress Notes (Signed)
   04/27/24 0900  Psych Admission Type (Psych Patients Only)  Admission Status Involuntary  Psychosocial Assessment  Patient Complaints None  Eye Contact Fair  Facial Expression Animated  Affect Preoccupied  Speech Soft  Interaction Childlike  Motor Activity Slow  Appearance/Hygiene Disheveled;Poor hygiene  Behavior Characteristics Cooperative  Mood Preoccupied  Thought Process  Coherency Disorganized  Content Preoccupation  Delusions None reported or observed  Perception WDL  Hallucination None reported or observed  Judgment Impaired  Confusion Mild  Danger to Self  Current suicidal ideation? Denies  Danger to Others  Danger to Others None reported or observed

## 2024-04-27 NOTE — Plan of Care (Signed)
  Problem: Education: Goal: Mental status will improve Outcome: Progressing Goal: Verbalization of understanding the information provided will improve Outcome: Progressing   Problem: Coping: Goal: Ability to verbalize frustrations and anger appropriately will improve Outcome: Progressing Goal: Ability to demonstrate self-control will improve Outcome: Progressing   

## 2024-04-27 NOTE — Progress Notes (Addendum)
 Collateral contact - case manager [facility]) Elveria Lacks [phone]    CSW left a voicemail asking for an assessment regarding enhanced rate.   Agricultural engineer from [facility]  interviewed patient virtually [email or web address] and accepted her to the group home.  They have beds available.  She requested 1915(I) service assessment through Medicaid.     Stephinie Battisti, LCSWA 04/27/2024

## 2024-04-27 NOTE — Progress Notes (Signed)
 Collateral contact [facility]) [phone]  Patient is still on the waiting list.   Diamon Reddinger, LCSWA 04/27/2024

## 2024-04-27 NOTE — BHH Group Notes (Signed)
 Adult Psychoeducational Group Note  Date:  04/27/2024 Time:  8:37 PM  Group Topic/Focus:  Wrap-Up Group:   The focus of this group is to help patients review their daily goal of treatment and discuss progress on daily workbooks.  Participation Level:  Did Not Attend  Aisha Celestine [name] 04/27/2024, 8:37 PM

## 2024-04-27 NOTE — Group Note (Signed)
 Recreation Therapy Group Note   Group Topic:Self-Esteem  Group Date: 04/27/2024 Start Time: 1042 End Time: 1115 Facilitators: Joslin Doell-McCall, LRT,CTRS Location: [address] Day[address]   Group Topic: Self-esteem  Goal Area(s) Addresses:  Patient will identify and write at least one positive trait about themself. Patient will successfully identify important people, dates and mottos that is important to them. Patient will acknowledge the benefit of healthy self-esteem. Patient will endorse understanding of ways to increase self-esteem.   Behavioral Response: Minimal   Intervention: Personalized Plate- printed license plate template, markers or colored pencils   Activity: LRT began group session with open dialogue asking the patients to define self-esteem and verbally identify positive qualities and traits people may possess. Patients were then instructed to design a personalized license plate, with words and drawings, representing at least 3 positive things about themselves. Pts were encouraged to include favorites, things they are proud of, what they enjoy doing, and dreams for their future. If a patient had a life motto or a meaningful phase that expressed their life values, pt's were asked to incorporate that into their design as well. Patients were given the opportunity to share their completed work with the group.   Education: Healthy self-esteem, Positive character traits, Accepting compliments, Leisure as competence and coping, Support Systems, Discharge planning; LRT educated patients on the importance of healthy self-esteem and ways to build self-esteem. LRT addressed discharge planning reviewing positive coping skills and healthy support systems.  Education Outcome: Acknowledges education/In group clarification offered   Affect/Mood: Appropriate   Participation Level: Minimal   Participation Quality: Independent   Behavior: On-looking   Speech/Thought Process:  Rational   Insight: Fair   Judgement: Fair    Modes of Intervention: Art   Patient Response to Interventions:  Attentive   Education Outcome:  In group clarification offered    Clinical Observations/Individualized Feedback: Pt didn't complete a license plate but did share some things with LRT. Pt stated she hadn't seen her kids in a while. Pt also shared one of her friends was getting married in March. Pt was bright and social in group.      Plan: Continue to engage patient in RT group sessions 2-3x/week.   Omarie Parcell-McCall, LRT,CTRS 04/27/2024 3:19 PM

## 2024-04-27 NOTE — Progress Notes (Signed)
 [facility] MD Progress Note  04/27/2024 1:04 PM Allison Bolton  MRN:  [id number] Subjective:   Allison Bolton is a 53 yr old female who presented on 02/17/24 to [facility] ED with a chief complaint of anxiety. In the ED, she was found to be acutely psychotic with tangential speech, agitation, and delusional thought content. Patient was admitted involuntarily to [facility] on 8/14.  PPHx is significant for Substance-Induced Psychosis, Bipolar I Disorder, Stimulant Use Disorder, GAD, Suicide Attempt (2007), and Prior Psychiatric Hospitalizations (last- [facility] 01/2024).   Case was discussed in the multidisciplinary team. MAR was reviewed and patient was compliant with medications.  She received PRN Maalox yesterday.   Psychiatric Team made the following recommendations yesterday: -Continue Haldol  10 mg q12 for psychosis and mood stability -Continue Geodon  60 mg BID for psychosis and mood stability -Continue Depakote  DR 750 mg q12 for mood stability -Continue Cogentin  1 mg BID for Drug Induced EPS -Continue Cipro 500 mg BID for 10 days (Day 9 of 10)    On interview today patient reports she slept good last night.  She reports her appetite is doing good.  She reports no SI, HI, or AVH.  She reports no Paranoia or Ideas of Reference.  She reports no issues with her medications.  Discussed with her that we were near the end of her antibiotic treatment.  She reports no further urinary symptoms.  She reports she just wants to be discharged and discussed with her that when a bed is available she will be discharged and she was agreeable.  She reports no other concerns at present.   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 The direct patient care time included face-to-face time with the patient, reviewing the patient's chart, communicating with other professionals, and coordinating care.    Past Psychiatric History:  Substance-Induced Psychosis, Bipolar I Disorder, Stimulant Use Disorder, GAD, Suicide Attempt (2007), and Prior Psychiatric Hospitalizations (last- [facility] 01/2024).  Past Medical History:  Past Medical History:  Diagnosis Date   Anxiety    Arthritis    Asthma    Bipolar 1 disorder (HCC)    Diverticulosis    Dysrhythmia    sts I have heart palpitations   Fibromyalgia    H/O hiatal hernia    4   Headache(784.0)    High blood pressure    Meniere's disease    S/P colonoscopy    Dr. Golda 2010: few small diverticula at sigmoid. otherwise normal.    Shortness of breath     Past Surgical History:  Procedure Laterality Date   ABDOMINAL HYSTERECTOMY     APPENDECTOMY     CESAREAN SECTION     CHOLECYSTECTOMY     COLONOSCOPY N/A 12/25/2023   Procedure: COLONOSCOPY;  Surgeon: Eartha Angelia Sieving, MD;  Location: [facility];  Service: Gastroenterology;  Laterality: N/A;  11:30am, asa 1   exploratory laparoscopy     FOOT SURGERY     HEMORRHOID SURGERY     LAPAROSCOPIC APPENDECTOMY  02/19/2011   Procedure: APPENDECTOMY LAPAROSCOPIC;  Surgeon: Oneil DELENA Budge;  Location: [facility] ORS;  Service: General;  Laterality: N/A;   LAPAROSCOPY  02/19/2011   Procedure: LAPAROSCOPY DIAGNOSTIC;  Surgeon:  Oneil DELENA Budge;  Location: [facility] ORS;  Service: General;  Laterality: N/A;   left ovarian removal     multiple hernia repairs     Right ovarian removal     TONSILLECTOMY     TONSILLECTOMY AND ADENOIDECTOMY     tubes in ears     UMBILICAL HERNIA REPAIR  Dec 2011   Dr. Budge   wisdom teeth removal     Family History:  Family History  Problem Relation Age of Onset   Thyroid  disease Mother    Colon cancer Father    Breast cancer Maternal  Aunt    Colon cancer Brother    Colon cancer Other        paternal and maternal grandfather   Meniere's disease Other    Anesthesia problems Neg Hx    Hypotension Neg Hx    Malignant hyperthermia Neg Hx    Pseudochol deficiency Neg Hx    Family Psychiatric  History:  None Reported   Social History:  Social History   Substance and Sexual Activity  Alcohol Use No   Comment: not since June 2012     Social History   Substance and Sexual Activity  Drug Use Yes   Types: Marijuana   Comment: patient denies any-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    Social History   Socioeconomic History   Marital status: Legally Separated    Spouse name: Not on file   Number of children: Not on file   Years of education: Not on file   Highest education level: Not on file  Occupational History   Not on file  Tobacco Use   Smoking status: Every Day    Current packs/day: 0.00    Average packs/day: 0.5 packs/day for 37.3 years (18.7 ttl pk-yrs)    Types: Cigarettes    Start date: 07/07/1981    Last attempt to quit: 11/05/2018    Years since quitting: 5.4   Smokeless tobacco: Never  Vaping Use   Vaping status: Never Used  Substance and Sexual Activity   Alcohol use: No    Comment: not since June 2012   Drug use: Yes    Types: Marijuana    Comment: patient denies any-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   Sexual activity: Never    Birth control/protection: Surgical  Other Topics Concern   Not on file  Social History Narrative   Not on file   Social Drivers of Health   Financial Resource Strain: Not on file  Food Insecurity: Food Insecurity Present (02/18/2024)   Hunger Vital Sign    Worried About Running Out of Food in the Last Year: Sometimes true    Ran Out of Food in the Last Year: Sometimes true  Transportation Needs: Unmet Transportation Needs (02/18/2024)   PRAPARE - Administrator, Civil Service (Medical): Yes    Lack of Transportation (Non-Medical):  Yes  Physical Activity: Not on file  Stress: Not on file  Social Connections: Unknown (10/14/2021)   Received from [facility]   Social Connections    Do your friends and family support you?: Not on file    What agencies support you?: Not on file   Additional Social History:                         Sleep: Good Estimated Sleeping Duration (Last 24 Hours): 7.25-7.75 hours  Appetite:  Good  Current Medications: Current Facility-Administered Medications  Medication Dose Route Frequency Provider Last Rate Last Admin   acetaminophen  (TYLENOL ) tablet 650 mg  650 mg Oral Q6H PRN White, Patrice L, NP   650 mg at 04/23/24 1555   alum & mag hydroxide-simeth (MAALOX/MYLANTA) 200-200-20 MG/5ML suspension 30 mL  30 mL Oral Q4H PRN White, Patrice L, NP   30 mL at 04/25/24 2207   benztropine  (COGENTIN ) tablet 1 mg  1 mg Oral BID White, Patrice L, NP   1 mg at 04/27/24 9147   ciprofloxacin (CIPRO) tablet 500 mg  500 mg Oral BID Kyl Givler S, DO   500 mg at 04/27/24 9147   divalproex  (DEPAKOTE ) DR tablet 750 mg  750 mg Oral Q12H Ji, Andrew, MD   750 mg at 04/27/24 9148   haloperidol  (HALDOL ) tablet 10 mg  10 mg Oral Q12H Ji, Andrew, MD   10 mg at 04/27/24 9147   hydrOXYzine  (ATARAX ) tablet 25 mg  25 mg Oral TID PRN White, Patrice L, NP   25 mg at 04/21/24 2054   magnesium  hydroxide (MILK OF MAGNESIA) suspension 30 mL  30 mL Oral Daily PRN White, Patrice L, NP       nicotine  polacrilex (NICORETTE ) gum 2 mg  2 mg Oral PRN Trudy Carwin, NP   2 mg at 04/23/24 1827   OLANZapine  (ZYPREXA ) injection 10 mg  10 mg Intramuscular TID PRN White, Patrice L, NP       OLANZapine  (ZYPREXA ) injection 5 mg  5 mg Intramuscular TID PRN White, Patrice L, NP       OLANZapine  zydis (ZYPREXA ) disintegrating tablet 5 mg  5 mg Oral TID PRN White, Patrice L, NP   5 mg at 04/18/24 0103   ondansetron  (ZOFRAN -ODT) disintegrating tablet 4 mg  4 mg Oral Q8H PRN Lynnette Barter, MD   4 mg at 04/02/24  1946   traZODone  (DESYREL ) tablet 50 mg  50 mg Oral QHS White, Patrice L, NP   50 mg at 04/26/24 2051   ziprasidone  (GEODON ) capsule 60 mg  60 mg Oral BID WC Marry Clamp, MD   60 mg at 04/27/24 9147    Lab Results:  No results found for this or any previous visit (from the past 48 hours).     Blood Alcohol level:  Lab Results  Component Value Date   [facility] <15 02/17/2024   ETH <15 01/14/2024    Metabolic Disorder Labs: Lab Results  Component Value Date   HGBA1C 5.1 01/18/2024   MPG 99.67 01/18/2024   MPG 105.41 05/11/2019   Lab Results  Component Value Date   PROLACTIN 24.1 (H) 05/11/2019   Lab Results  Component Value Date   CHOL 154 01/18/2024   TRIG 180 (H) 01/18/2024   HDL 49 01/18/2024   CHOLHDL 3.1 01/18/2024   VLDL 36 01/18/2024   LDLCALC 69 01/18/2024   LDLCALC 82 05/11/2019    Physical Findings: AIMS:  ,  ,  ,  ,  ,  ,   CIWA:    COWS:     Musculoskeletal: Strength & Muscle Tone: within normal limits Gait & Station: normal Patient leans: N/A  Psychiatric Specialty Exam:  Presentation  General Appearance:  Casual  Eye Contact: Good  Speech: Clear and Coherent; Normal Rate  Speech Volume: Normal  Handedness:No data recorded  Mood and Affect  Mood: Euthymic  Affect: Other (comment) (smiling)   Thought Process  Thought Processes: Linear  Descriptions of Associations:Loose  Orientation:Partial  Thought  Content:Scattered  History of Schizophrenia/Schizoaffective disorder:No  Duration of Psychotic Symptoms:Greater than six months  Hallucinations:Hallucinations: None   Ideas of Reference:None  Suicidal Thoughts:Suicidal Thoughts: No   Homicidal Thoughts:Homicidal Thoughts: No    Sensorium  Memory: Immediate Poor; Recent Poor  Judgment: Impaired  Insight: Shallow   Executive Functions  Concentration: Fair  Attention Span: Fair  Recall: Poor  Fund of  Knowledge: Poor  Language: Fair   Psychomotor Activity  Psychomotor Activity:Psychomotor Activity: Normal    Assets  Assets: Resilience   Sleep  Sleep:Sleep: Good     Physical Exam: Physical Exam Vitals and nursing note reviewed.  Constitutional:      General: She is not in acute distress.    Appearance: Normal appearance. She is normal weight. She is not toxic-appearing.  HENT:     Head: Normocephalic and atraumatic.  Pulmonary:     Effort: Pulmonary effort is normal.  Musculoskeletal:        General: Normal range of motion.  Neurological:     General: No focal deficit present.     Mental Status: She is alert.    Review of Systems  Respiratory:  Negative for cough and shortness of breath.   Cardiovascular:  Negative for chest pain.  Gastrointestinal:  Negative for abdominal pain, constipation, diarrhea, nausea and vomiting.  Neurological:  Negative for dizziness, weakness and headaches.  Psychiatric/Behavioral:  Negative for depression, hallucinations and suicidal ideas. The patient is not nervous/anxious.    Blood pressure (!) 123/103, pulse 72, temperature 98.5 F (36.9 C), temperature source Oral, resp. rate 16, height 5' 6 (1.676 m), weight 80.7 kg, SpO2 93%. Body mass index is 28.71 kg/m.   Treatment Plan Summary: Daily contact with patient to assess and evaluate symptoms and progress in treatment and Medication management  Allison Bolton is a 53 yr old female who presented on 02/17/24 to [facility] ED with a chief complaint of anxiety. In the ED, she was found to be acutely psychotic with tangential speech, agitation, and delusional thought content. Patient was admitted involuntarily to [facility] on 8/14.  PPHx is significant for Substance-Induced Psychosis, Bipolar I Disorder, Stimulant Use Disorder, GAD, Suicide Attempt (2007), and Prior Psychiatric Hospitalizations (last- [facility] 01/2024).   Allison Bolton remains stable from a psychiatric standpoint.  We  await a group home bed for placement.  She is near completing her antibiotic course and is not endorsing any UTI symptoms.  We will not make any changes to her medications at this time.  We will continue to monitor.    Substance-induced psychosis  Bipolar disorder: -Continue Haldol  10 mg q12 for psychosis and mood stability -Continue Geodon  60 mg BID for psychosis and mood stability -Continue Depakote  DR 750 mg q12 for mood stability -Continue Cogentin  1 mg BID for Drug Induced EPS -Continue Agitation Protocol: Zyprexa    Dementia in other diseases classified elsewhere, unspecified severity, without behavioral disturbance, psychotic disturbance, mood disturbance, and anxiety: Allison Bolton was given a MOCA on 9/17 and scored an 8.  [facility] SW is seeking a guardian for the patient as she needs 24 hour assistance at this point and they are looking for placement at either SNF or group home.     Nicotine  Dependence: -Continue Nicotine  Gum 2 mg PRN   UTI: -UA shows E. Coli resistant to Bactrim  -Continue Cipro 500 mg BID for 10 days (Day 10 of 10)   -Continue Zofran  4 mg q8 PRN nausea/vomiting -Continue PRN's: Tylenol , Maalox, Atarax , Milk of Magnesia, Trazodone    --  The risks/benefits/side-effects/alternatives to medications were discussed in detail with the patient and time was given for questions. The patient consents to medication trials.                -- Metabolic profile and EKG monitoring obtained while on an atypical antipsychotic (BMI: 28.73 Lipid Panel: WNL except Trig: 180 HbgA1c: 5.1)              -- Encouraged patient to participate in unit milieu and in scheduled group therapies              -- Short Term Goals: Ability to identify changes in lifestyle to reduce recurrence of condition will improve, Ability to verbalize feelings will improve, Ability to disclose and discuss suicidal ideas, Ability to demonstrate self-control will improve, Ability to identify and develop effective  coping behaviors will improve, Ability to maintain clinical measurements within normal limits will improve, Compliance with prescribed medications will improve, and Ability to identify triggers associated with substance abuse/mental health issues will improve             -- Long Term Goals: Improvement in symptoms so as ready for discharge   Safety and Monitoring:             -- Involuntary admission to inpatient psychiatric unit for safety, stabilization and treatment             -- Daily contact with patient to assess and evaluate symptoms and progress in treatment             -- Patient's case to be discussed in multi-disciplinary team meeting             -- Observation Level : q15 minute checks             -- Vital signs:  q12 hours             -- Precautions: suicide, elopement, and assault  Discharge Planning:              -- Social work and case management to assist with discharge planning and identification of hospital follow-up needs prior to discharge             -- Estimated LOS: 7+ more days             -- Discharge Concerns: Need to establish a safety plan; Medication compliance and effectiveness             -- Discharge Goals: Return home with outpatient referrals for mental health follow-up including medication management/psychotherapy   Marsa GORMAN Rosser, DO 04/27/2024, 1:04 PM

## 2024-04-28 NOTE — Progress Notes (Signed)
 Collateral contact - Suzen Ellen [facility] [employer], [phone], [email or web address]>  Patient participated in virtual assessment with Suzen.  They completed 1915(I) form.  Suzen said that she will approve patient for the service.  She said the State has to review the document and approve it as well, and it would take 2 - 6 weeks.  She said that another care coordinator will be assigned today and will monitor the process.  Care Coordinator will call CSW on Friday, 10/24 or Monday, 10/27.   Talena Neira, LCSWA 04/28/2024

## 2024-04-28 NOTE — Group Note (Signed)
 Date:  04/28/2024 Time:  8:37 PM  Group Topic/Focus:  Wrap-Up Group:   The focus of this group is to help patients review their daily goal of treatment and discuss progress on daily workbooks.    Participation Level:  Did Not Attend   Elek Holderness Dacosta 04/28/2024, 8:37 PM

## 2024-04-28 NOTE — Progress Notes (Signed)
 Collateral contact [facility]) [phone]   Patient is still on the waiting list.     Xylina Rhoads, LCSWA 04/28/2024

## 2024-04-28 NOTE — Progress Notes (Signed)
(  Sleep Hours) - 8.25  (Any PRNs that were needed, meds refused, or side effects to meds)-  Vistaril  for anxiety at HS- Effective.  (Any disturbances and when (visitation, over night)- None  (Concerns raised by the patient)-  None  (SI/HI/AVH)- Denies

## 2024-04-28 NOTE — Group Note (Signed)
 Occupational Therapy Group Note  Group Topic: Sleep Hygiene  Group Date: 04/28/2024 Start Time: 1533 End Time: 1602 Facilitators: Dot [place] MATSU, OT   Group Description: Group encouraged increased participation and engagement through topic focused on sleep hygiene. Patients reflected on the quality of sleep they typically receive and identified areas that need improvement. Group was given background information on sleep and sleep hygiene, including common sleep disorders. Group members also received information on how to improve one's sleep and introduced a sleep diary as a tool that can be utilized to track sleep quality over a length of time. Group session ended with patients identifying one or more strategies they could utilize or implement into their sleep routine in order to improve overall sleep quality.        Therapeutic Goal(s):  Identify one or more strategies to improve overall sleep hygiene  Identify one or more areas of sleep that are negatively impacted (sleep too much, too little, etc)     Participation Level: Minimal   Participation Quality: Moderate Cues   Behavior: Distracted, Off-task, and Preoccupied   Speech/Thought Process: Disorganized   Affect/Mood: Flat   Insight: Impaired   Judgement: Impaired      Modes of Intervention: Education  Patient Response to Interventions:  Attentive   Plan: Continue to engage patient in OT groups 2 - 3x/week.  04/28/2024  [place] MATSU Dot, OT   Allison Bolton, OT

## 2024-04-28 NOTE — Plan of Care (Signed)
  Problem: Education: Goal: Knowledge of [place] [facility] Education information/materials will improve Outcome: Progressing Goal: Mental status will improve Outcome: Progressing   Problem: Activity: Goal: Interest or engagement in activities will improve Outcome: Progressing   Problem: Health Behavior/Discharge Planning: Goal: Identification of resources available to assist in meeting health care needs will improve Outcome: Progressing

## 2024-04-28 NOTE — Progress Notes (Signed)
 [facility] MD Progress Note  04/28/2024 4:54 PM Allison Bolton  MRN:  [id number] Subjective:   Allison Bolton is a 53 yr old female who presented on 02/17/24 to [facility] ED with a chief complaint of anxiety. In the ED, she was found to be acutely psychotic with tangential speech, agitation, and delusional thought content. Patient was admitted involuntarily to [facility] on 8/14.  PPHx is significant for Substance-Induced Psychosis, Bipolar I Disorder, Stimulant Use Disorder, GAD, Suicide Attempt (2007), and Prior Psychiatric Hospitalizations (last- [facility] 01/2024).   Case was discussed in the multidisciplinary team. MAR was reviewed and patient was compliant with medications.  She received PRN Maalox yesterday.   Psychiatric Team made the following recommendations yesterday: -Continue Haldol  10 mg q12 for psychosis and mood stability -Continue Geodon  60 mg BID for psychosis and mood stability -Continue Depakote  DR 750 mg q12 for mood stability -Continue Cogentin  1 mg BID for Drug Induced EPS -Continue Cipro 500 mg BID for 10 days (Day 9 of 10)    On interview today patient reports she slept good last night.  She reports her appetite is doing good.  She reports no SI, HI, or AVH.  She reports no Paranoia or Ideas of Reference.  She reports no issues with her medications.  Discussed with her that she would be completing her antibiotic course today.  She reports no further urinary symptoms.  She reports no other concerns at present.   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 The direct patient care time included face-to-face time with the patient,  reviewing the patient's chart, communicating with other professionals, and coordinating care.    Past Psychiatric History:  Substance-Induced Psychosis, Bipolar I Disorder, Stimulant Use Disorder, GAD, Suicide Attempt (2007), and Prior Psychiatric Hospitalizations (last- [facility] 01/2024).  Past Medical History:  Past Medical History:  Diagnosis Date   Anxiety    Arthritis    Asthma    Bipolar 1 disorder (HCC)    Diverticulosis    Dysrhythmia    sts I have heart palpitations   Fibromyalgia    H/O hiatal hernia    4   Headache(784.0)    High blood pressure    Meniere's disease    S/P colonoscopy    Dr. Golda 2010: few small diverticula at sigmoid. otherwise normal.    Shortness of breath     Past Surgical History:  Procedure Laterality Date   ABDOMINAL HYSTERECTOMY     APPENDECTOMY     CESAREAN SECTION     CHOLECYSTECTOMY     COLONOSCOPY N/A 12/25/2023   Procedure: COLONOSCOPY;  Surgeon: Eartha Angelia Sieving, MD;  Location: [facility] ENDO SU[facility];  Service: Gastroenterology;  Laterality: N/A;  11:30am, asa 1   exploratory laparoscopy     FOOT SURGERY     HEMORRHOID SURGERY     LAPAROSCOPIC APPENDECTOMY  02/19/2011   Procedure: APPENDECTOMY LAPAROSCOPIC;  Surgeon: Oneil DELENA Budge;  Location: [facility] ORS;  Service: General;  Laterality: N/A;   LAPAROSCOPY  02/19/2011   Procedure: LAPAROSCOPY DIAGNOSTIC;  Surgeon: Oneil DELENA Budge;  Location: [facility] ORS;  Service: General;  Laterality: N/A;   left ovarian removal     multiple hernia repairs  Right ovarian removal     TONSILLECTOMY     TONSILLECTOMY AND ADENOIDECTOMY     tubes in ears     UMBILICAL HERNIA REPAIR  Dec 2011   Dr. Mavis   wisdom teeth removal     Family History:  Family History  Problem Relation Age of Onset   Thyroid  disease Mother    Colon cancer Father    Breast cancer Maternal Aunt    Colon cancer Brother    Colon cancer Other        paternal and maternal grandfather   Meniere's disease Other    Anesthesia  problems Neg Hx    Hypotension Neg Hx    Malignant hyperthermia Neg Hx    Pseudochol deficiency Neg Hx    Family Psychiatric  History:  None Reported   Social History:  Social History   Substance and Sexual Activity  Alcohol Use No   Comment: not since June 2012     Social History   Substance and Sexual Activity  Drug Use Yes   Types: Marijuana   Comment: patient denies any-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    Social History   Socioeconomic History   Marital status: Legally Separated    Spouse name: Not on file   Number of children: Not on file   Years of education: Not on file   Highest education level: Not on file  Occupational History   Not on file  Tobacco Use   Smoking status: Every Day    Current packs/day: 0.00    Average packs/day: 0.5 packs/day for 37.3 years (18.7 ttl pk-yrs)    Types: Cigarettes    Start date: 07/07/1981    Last attempt to quit: 11/05/2018    Years since quitting: 5.4   Smokeless tobacco: Never  Vaping Use   Vaping status: Never Used  Substance and Sexual Activity   Alcohol use: No    Comment: not since June 2012   Drug use: Yes    Types: Marijuana    Comment: patient denies any-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   Sexual activity: Never    Birth control/protection: Surgical  Other Topics Concern   Not on file  Social History Narrative   Not on file   Social Drivers of Health   Financial Resource Strain: Not on file  Food Insecurity: Food Insecurity Present (02/18/2024)   Hunger Vital Sign    Worried About Running Out of Food in the Last Year: Sometimes true    Ran Out of Food in the Last Year: Sometimes true  Transportation Needs: Unmet Transportation Needs (02/18/2024)   PRAPARE - Administrator, Civil Service (Medical): Yes    Lack of Transportation (Non-Medical): Yes  Physical Activity: Not on file  Stress: Not on file  Social Connections: Unknown (10/14/2021)   Received from [facility]   Social Connections    Do your friends and family support you?: Not on file    What agencies support you?: Not on file   Additional Social History:                         Sleep: Good Estimated Sleeping Duration (Last 24 Hours): 8.25-10.25 hours  Appetite:  Good  Current Medications: Current Facility-Administered Medications  Medication Dose Route Frequency Provider Last Rate Last Admin   acetaminophen  (TYLENOL ) tablet 650 mg  650 mg Oral Q6H PRN White,  Patrice L, NP   650 mg at 04/23/24 1555   alum & mag hydroxide-simeth (MAALOX/MYLANTA) 200-200-20 MG/5ML suspension 30 mL  30 mL Oral Q4H PRN White, Patrice L, NP   30 mL at 04/25/24 2207   benztropine  (COGENTIN ) tablet 1 mg  1 mg Oral BID White, Patrice L, NP   1 mg at 04/28/24 1649   divalproex  (DEPAKOTE ) DR tablet 750 mg  750 mg Oral Q12H Ji, Andrew, MD   750 mg at 04/28/24 0849   haloperidol  (HALDOL ) tablet 10 mg  10 mg Oral Q12H Ji, Andrew, MD   10 mg at 04/28/24 9148   hydrOXYzine  (ATARAX ) tablet 25 mg  25 mg Oral TID PRN White, Patrice L, NP   25 mg at 04/27/24 2034   magnesium  hydroxide (MILK OF MAGNESIA) suspension 30 mL  30 mL Oral Daily PRN White, Patrice L, NP       nicotine  polacrilex (NICORETTE ) gum 2 mg  2 mg Oral PRN Trudy Carwin, NP   2 mg at 04/23/24 1827   OLANZapine  (ZYPREXA ) injection 10 mg  10 mg Intramuscular TID PRN White, Patrice L, NP       OLANZapine  (ZYPREXA ) injection 5 mg  5 mg Intramuscular TID PRN White, Patrice L, NP       OLANZapine  zydis (ZYPREXA ) disintegrating tablet 5 mg  5 mg Oral TID PRN White, Patrice L, NP   5 mg at 04/18/24 0103   ondansetron  (ZOFRAN -ODT) disintegrating tablet 4 mg  4 mg Oral Q8H PRN Lynnette Barter, MD   4 mg at 04/02/24 1946   traZODone  (DESYREL ) tablet 50 mg  50 mg Oral QHS White, Patrice L, NP   50 mg at 04/27/24 2054   ziprasidone  (GEODON ) capsule 60 mg  60 mg Oral BID WC Marry Clamp, MD   60 mg at 04/28/24 1649    Lab Results:  No results found for  this or any previous visit (from the past 48 hours).     Blood Alcohol level:  Lab Results  Component Value Date   [facility] <15 02/17/2024   ETH <15 01/14/2024    Metabolic Disorder Labs: Lab Results  Component Value Date   HGBA1C 5.1 01/18/2024   MPG 99.67 01/18/2024   MPG 105.41 05/11/2019   Lab Results  Component Value Date   PROLACTIN 24.1 (H) 05/11/2019   Lab Results  Component Value Date   CHOL 154 01/18/2024   TRIG 180 (H) 01/18/2024   HDL 49 01/18/2024   CHOLHDL 3.1 01/18/2024   VLDL 36 01/18/2024   LDLCALC 69 01/18/2024   LDLCALC 82 05/11/2019    Physical Findings: AIMS:  ,  ,  ,  ,  ,  ,   CIWA:    COWS:     Musculoskeletal: Strength & Muscle Tone: within normal limits Gait & Station: normal Patient leans: N/A  Psychiatric Specialty Exam:  Presentation  General Appearance:  Casual  Eye Contact: Fair  Speech: Clear and Coherent; Normal Rate  Speech Volume: Normal  Handedness:No data recorded  Mood and Affect  Mood: Euthymic  Affect: Other (comment) (smiling)   Thought Process  Thought Processes: Linear  Descriptions of Associations:Loose  Orientation:Partial  Thought Content:Scattered  History of Schizophrenia/Schizoaffective disorder:No  Duration of Psychotic Symptoms:Greater than six months  Hallucinations:Hallucinations: None   Ideas of Reference:None  Suicidal Thoughts:Suicidal Thoughts: No   Homicidal Thoughts:Homicidal Thoughts: No    Sensorium  Memory: Immediate Poor; Recent Poor  Judgment: Impaired  Insight: Shallow   Executive  Functions  Concentration: Fair  Attention Span: Fair  Recall: Poor  Fund of Knowledge: Poor  Language: Fair   Lexicographer Activity:Psychomotor Activity: Normal    Assets  Assets: Resilience   Sleep  Sleep:Sleep: Good     Physical Exam: Physical Exam Vitals and nursing note reviewed.  Constitutional:      General: She  is not in acute distress.    Appearance: Normal appearance. She is normal weight. She is not ill-appearing or toxic-appearing.  HENT:     Head: Normocephalic and atraumatic.  Pulmonary:     Effort: Pulmonary effort is normal.  Musculoskeletal:        General: Normal range of motion.  Neurological:     General: No focal deficit present.     Mental Status: She is alert.    Review of Systems  Respiratory:  Negative for cough and shortness of breath.   Cardiovascular:  Negative for chest pain.  Gastrointestinal:  Negative for abdominal pain, constipation, diarrhea, nausea and vomiting.  Neurological:  Negative for dizziness, weakness and headaches.  Psychiatric/Behavioral:  Negative for depression, hallucinations and suicidal ideas. The patient is not nervous/anxious.    Blood pressure 101/77, pulse 79, temperature 98 F (36.7 C), resp. rate 16, height 5' 6 (1.676 m), weight 80.7 kg, SpO2 93%. Body mass index is 28.71 kg/m.   Treatment Plan Summary: Daily contact with patient to assess and evaluate symptoms and progress in treatment and Medication management  Allison Bolton is a 53 yr old female who presented on 02/17/24 to [facility] ED with a chief complaint of anxiety. In the ED, she was found to be acutely psychotic with tangential speech, agitation, and delusional thought content. Patient was admitted involuntarily to [facility] on 8/14.  PPHx is significant for Substance-Induced Psychosis, Bipolar I Disorder, Stimulant Use Disorder, GAD, Suicide Attempt (2007), and Prior Psychiatric Hospitalizations (last- [facility] 01/2024).   Allison Bolton remains stable from a psychiatric standpoint.  We await a group home bed for placement.  She will complete her antibiotic course this morning.  She is no longer reporting any urinary symptoms. Make any change to medication at this time.  Will continue to monitor.   Substance-induced psychosis  Bipolar disorder: -Continue Haldol  10 mg q12 for psychosis  and mood stability -Continue Geodon  60 mg BID for psychosis and mood stability -Continue Depakote  DR 750 mg q12 for mood stability -Continue Cogentin  1 mg BID for Drug Induced EPS -Continue Agitation Protocol: Zyprexa    Dementia in other diseases classified elsewhere, unspecified severity, without behavioral disturbance, psychotic disturbance, mood disturbance, and anxiety: Allison Bolton was given a MOCA on 9/17 and scored an 8.  [facility] SW is seeking a guardian for the patient as she needs 24 hour assistance at this point and they are looking for placement at either SNF or group home.     Nicotine  Dependence: -Continue Nicotine  Gum 2 mg PRN   UTI: -UA shows E. Coli resistant to Bactrim  - Completed Cipro   -Continue Zofran  4 mg q8 PRN nausea/vomiting -Continue PRN's: Tylenol , Maalox, Atarax , Milk of Magnesia, Trazodone    --  The risks/benefits/side-effects/alternatives to medications were discussed in detail with the patient and time was given for questions. The patient consents to medication trials.                -- Metabolic profile and EKG monitoring obtained while on an atypical antipsychotic (BMI: 28.73 Lipid Panel: WNL except Trig: 180 HbgA1c: 5.1)              --  Encouraged patient to participate in unit milieu and in scheduled group therapies              -- Short Term Goals: Ability to identify changes in lifestyle to reduce recurrence of condition will improve, Ability to verbalize feelings will improve, Ability to disclose and discuss suicidal ideas, Ability to demonstrate self-control will improve, Ability to identify and develop effective coping behaviors will improve, Ability to maintain clinical measurements within normal limits will improve, Compliance with prescribed medications will improve, and Ability to identify triggers associated with substance abuse/mental health issues will improve             -- Long Term Goals: Improvement in symptoms so as ready for  discharge   Safety and Monitoring:             -- Involuntary admission to inpatient psychiatric unit for safety, stabilization and treatment             -- Daily contact with patient to assess and evaluate symptoms and progress in treatment             -- Patient's case to be discussed in multi-disciplinary team meeting             -- Observation Level : q15 minute checks             -- Vital signs:  q12 hours             -- Precautions: suicide, elopement, and assault  Discharge Planning:              -- Social work and case management to assist with discharge planning and identification of hospital follow-up needs prior to discharge             -- Estimated LOS: 7+ more days             -- Discharge Concerns: Need to establish a safety plan; Medication compliance and effectiveness             -- Discharge Goals: Return home with outpatient referrals for mental health follow-up including medication management/psychotherapy   Marsa GORMAN Rosser, DO 04/28/2024, 4:54 PM

## 2024-04-28 NOTE — Progress Notes (Signed)
 Collateral contact - case manager Al[facility]) Elveria Lacks [phone]   Elveria said that someone else in her office completes 1915(I) forms, so she will make a referral.  She said she received a voicemail from [facility] from the [facility] group home and will call her as soon as she gets off the phone to make sure that the document contains all information the group home requested.  She asked that patient's legal guardian send her proof of guardianship to [email or web address].   Collateral contact - DSS Adult Protective Services Social Worker Izetta Roses) [phone]   CSW left a voicemail for legal guardian asking her to email guardianship documents to [email or web address]   Tokiko Diefenderfer, LCSWA 04/28/2024

## 2024-04-28 NOTE — Progress Notes (Signed)
   04/28/24 0900  Psych Admission Type (Psych Patients Only)  Admission Status Involuntary  Psychosocial Assessment  Patient Complaints None  Eye Contact Fair  Facial Expression Animated  Affect Preoccupied  Speech Soft  Interaction Childlike  Motor Activity Slow  Appearance/Hygiene Disheveled;Poor hygiene  Behavior Characteristics Cooperative  Mood Preoccupied  Thought Process  Coherency Disorganized  Content Preoccupation  Delusions None reported or observed  Perception WDL  Hallucination None reported or observed  Judgment Impaired  Confusion Mild  Danger to Self  Current suicidal ideation? Denies  Danger to Others  Danger to Others None reported or observed

## 2024-04-28 NOTE — Plan of Care (Signed)
   Problem: Education: Goal: Emotional status will improve Outcome: Progressing Goal: Mental status will improve Outcome: Progressing Goal: Verbalization of understanding the information provided will improve Outcome: Progressing

## 2024-04-28 NOTE — Group Note (Signed)
 Date:  04/28/2024 Time:  9:22 AM  Group Topic/Focus:  Goals Group:   The focus of this group is to help patients establish daily goals to achieve during treatment and discuss how the patient can incorporate goal setting into their daily lives to aide in recovery.    Participation Level:  Minimal  Participation Quality:  Appropriate  Affect:  Appropriate  Cognitive:  Appropriate  Insight: Appropriate  Engagement in Group:  Engaged  Modes of Intervention:  Discussion  Additional Comments:  Patient Attended Orientation Group.  Allison Bolton 04/28/2024, 9:22 AM

## 2024-04-28 NOTE — Group Note (Signed)
 Recreation Therapy Group Note   Group Topic:Team Building  Group Date: 04/28/2024 Start Time: 1002 End Time: 1030 Facilitators: Jarrod Mcenery-McCall, LRT,CTRS Location: [address] Dayroom   Group Topic: Communication, Team Building, Problem Solving  Goal Area(s) Addresses:  Patient will effectively work with peer towards shared goal.  Patient will identify skills used to make activity successful.  Patient will identify how skills used during activity can be applied to reach post d/c goals.   Behavioral Response: None  Intervention: STEM Activity- Glass blower/designer  Activity: Tallest [employer]. In teams of 5-6, patients were given 11 craft pipe cleaners. Using the materials provided, patients were instructed to compete again the opposing team(s) to build the tallest free-standing structure from floor level. The activity was timed; difficulty increased by Clinical research associate as Production designer, theatre/television/film continued.  Systematically resources were removed with additional directions for example, placing one arm behind their back, working in silence, and shape stipulations. LRT facilitated post-activity discussion reviewing team processes and necessary communication skills involved in completion. Patients were encouraged to reflect how the skills utilized, or not utilized, in this activity can be incorporated to positively impact support systems post discharge.  Education: Pharmacist, community, Scientist, physiological, Discharge Planning   Education Outcome: Acknowledges education/In group clarification offered/Needs additional education.    Affect/Mood: Appropriate   Participation Level: None   Participation Quality: None   Behavior: Disinterested   Speech/Thought Process: Delusional   Insight: None   Judgement: None   Modes of Intervention: STEM Activity   Patient Response to Interventions:  Disengaged   Education Outcome:  In group clarification offered    Clinical Observations/Individualized  Feedback: Pt came into group but didn't participate. Pt was focused on food, that's primarily what she talked about the whole time. Pt also rambling about one of her peers being scheduled for surgery.     Plan: Continue to engage patient in RT group sessions 2-3x/week.   Danine Hor-McCall, LRT,CTRS 04/28/2024 10:49 AM

## 2024-04-29 ENCOUNTER — Encounter (HOSPITAL_COMMUNITY): Payer: Self-pay
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
## 2024-04-29 NOTE — BH Assessment (Signed)
(  Sleep Hours) - 7.5 (Any PRNs that were needed, meds refused, or side effects to meds)-  (Any disturbances and when (visitation, over night)- None (Concerns raised by the patient)- None (SI/HI/AVH)- Denies with noted confusion

## 2024-04-29 NOTE — Progress Notes (Addendum)
 Collateral contact - Suzen Ellen [facility] Medicaid) [email or web address]>  CSW received an email from [place]:    [phone] Care Coordinator Camisha [email or web address] has been assigned to [place] case for [phone] service provision. As we discussed yesterday, we submitted the 1915i assessment for an eligibility determination. This process usually takes anywhere from 2-6 weeks. As soon as we hear back from the external reviewer, [employer], that the member has an eligibility determination, we can move forward in getting the member set up with services. The group home director, Amber Cosmee has been made aware of this as well. I have included Camisha on this email thread so she may contact Haani Bakula (the Child psychotherapist at [facility]) and vice versa. Please let me know if you need anything else, thank you!   Suzen Ellen, OTR/L [phone]) Care Coordination Manager [facility]    [address], [place], [place] [address] [phone]    F [phone]   Collateral contact - Jeoffrey Candy from [facility] [phone]  Jeoffrey said that she needs verbal confirmation from Bump Elaine that they would approve 1915i.  She said that tentative discharge date is 11/1, and this plan is doable based on her experience.   Collateral contact - Bump Elaine [phone]  Camisha asked if patient is in psychiatric hospital and CSW conformed it.  Camisha said that patient will be approved.  She said she didn't feel comfortable giving verbal confirmation, however will discuss with her supervisor if this is an option.  CSW shared with her Amber's contact information Catering manager gave permission).   [facility] [employer] Social Worker Izetta Roses) [phone]   CSW gave Izetta an update.   Johnella Crumm, LCSWA 04/29/2024

## 2024-04-29 NOTE — Progress Notes (Signed)
 [facility] MD Progress Note  04/29/2024 12:04 PM Allison Bolton  MRN:  [id number] Subjective:   Allison Bolton is a 53 yr old female who presented on 02/17/24 to [facility] ED with a chief complaint of anxiety. In the ED, she was found to be acutely psychotic with tangential speech, agitation, and delusional thought content. Patient was admitted involuntarily to [facility] on 8/14.  PPHx is significant for Substance-Induced Psychosis, Bipolar I Disorder, Stimulant Use Disorder, GAD, Suicide Attempt (2007), and Prior Psychiatric Hospitalizations (last- [facility] 01/2024).   Case was discussed in the multidisciplinary team. MAR was reviewed and patient was compliant with medications.  She received PRN Maalox yesterday.   Psychiatric Team made the following recommendations yesterday: -Continue Haldol  10 mg q12 for psychosis and mood stability -Continue Geodon  60 mg BID for psychosis and mood stability -Continue Depakote  DR 750 mg q12 for mood stability -Continue Cogentin  1 mg BID for Drug Induced EPS -Completed Cipro course    On interview today patient reports she slept good last night.  She reports her appetite is doing good.  She reports no SI, HI, or AVH.  She reports no Paranoia or Ideas of Reference.  She reports no issues with her medications.  She reports she is looking forward to going home.  She reports no other concerns at present.   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 The direct patient care time included face-to-face time with the patient, reviewing the patient's chart, communicating with other professionals, and coordinating care.     Past Psychiatric History:  Substance-Induced Psychosis, Bipolar I Disorder, Stimulant Use Disorder, GAD, Suicide Attempt (2007), and Prior Psychiatric Hospitalizations (last- [facility] 01/2024).  Past Medical History:  Past Medical History:  Diagnosis Date   Anxiety    Arthritis    Asthma    Bipolar 1 disorder (HCC)    Diverticulosis    Dysrhythmia    sts I have heart palpitations   Fibromyalgia    H/O hiatal hernia    4   Headache(784.0)    High blood pressure    Meniere's disease    S/P colonoscopy    Dr. Golda 2010: few small diverticula at sigmoid. otherwise normal.    Shortness of breath     Past Surgical History:  Procedure Laterality Date   ABDOMINAL HYSTERECTOMY     APPENDECTOMY     CESAREAN SECTION     CHOLECYSTECTOMY     COLONOSCOPY N/A 12/25/2023   Procedure: COLONOSCOPY;  Surgeon: Eartha Angelia Sieving, MD;  Location: [facility];  Service: Gastroenterology;  Laterality: N/A;  11:30am, asa 1   exploratory laparoscopy     FOOT SURGERY     HEMORRHOID SURGERY     LAPAROSCOPIC APPENDECTOMY  02/19/2011   Procedure: APPENDECTOMY LAPAROSCOPIC;  Surgeon: Oneil DELENA Budge;  Location: [facility] ORS;  Service: General;  Laterality: N/A;   LAPAROSCOPY  02/19/2011   Procedure: LAPAROSCOPY DIAGNOSTIC;  Surgeon: Oneil DELENA Budge;  Location: [facility] ORS;  Service: General;  Laterality: N/A;   left ovarian removal     multiple hernia repairs     Right ovarian removal     TONSILLECTOMY     TONSILLECTOMY AND ADENOIDECTOMY  tubes in ears     UMBILICAL HERNIA REPAIR  Dec 2011   Dr. Mavis   wisdom teeth removal     Family History:  Family History  Problem Relation Age of Onset   Thyroid  disease Mother    Colon cancer Father    Breast cancer Maternal Aunt    Colon cancer Brother    Colon cancer Other        paternal and maternal grandfather   Meniere's disease Other    Anesthesia problems Neg Hx    Hypotension Neg Hx    Malignant hyperthermia Neg Hx    Pseudochol  deficiency Neg Hx    Family Psychiatric  History:  None Reported   Social History:  Social History   Substance and Sexual Activity  Alcohol Use No   Comment: not since June 2012     Social History   Substance and Sexual Activity  Drug Use Yes   Types: Marijuana   Comment: patient denies any-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    Social History   Socioeconomic History   Marital status: Legally Separated    Spouse name: Not on file   Number of children: Not on file   Years of education: Not on file   Highest education level: Not on file  Occupational History   Not on file  Tobacco Use   Smoking status: Every Day    Current packs/day: 0.00    Average packs/day: 0.5 packs/day for 37.3 years (18.7 ttl pk-yrs)    Types: Cigarettes    Start date: 07/07/1981    Last attempt to quit: 11/05/2018    Years since quitting: 5.4   Smokeless tobacco: Never  Vaping Use   Vaping status: Never Used  Substance and Sexual Activity   Alcohol use: No    Comment: not since June 2012   Drug use: Yes    Types: Marijuana    Comment: patient denies any-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   Sexual activity: Never    Birth control/protection: Surgical  Other Topics Concern   Not on file  Social History Narrative   Not on file   Social Drivers of Health   Financial Resource Strain: Not on file  Food Insecurity: Food Insecurity Present (02/18/2024)   Hunger Vital Sign    Worried About Running Out of Food in the Last Year: Sometimes true    Ran Out of Food in the Last Year: Sometimes true  Transportation Needs: Unmet Transportation Needs (02/18/2024)   PRAPARE - Administrator, Civil Service (Medical): Yes    Lack of Transportation (Non-Medical): Yes  Physical Activity: Not on file  Stress: Not on file  Social Connections: Unknown (10/14/2021)   Received from [facility]   Social Connections    Do your friends and family support you?: Not on file     What agencies support you?: Not on file   Additional Social History:                         Sleep: Good Estimated Sleeping Duration (Last 24 Hours): 7.00-8.25 hours  Appetite:  Good  Current Medications: Current Facility-Administered Medications  Medication Dose Route Frequency Provider Last Rate Last Admin   acetaminophen  (TYLENOL ) tablet 650 mg  650 mg Oral Q6H PRN White, Patrice L, NP   650 mg at 04/29/24 0022   alum & mag hydroxide-simeth (MAALOX/MYLANTA) 200-200-20 MG/5ML  suspension 30 mL  30 mL Oral Q4H PRN White, Patrice L, NP   30 mL at 04/25/24 2207   benztropine  (COGENTIN ) tablet 1 mg  1 mg Oral BID White, Patrice L, NP   1 mg at 04/29/24 9088   divalproex  (DEPAKOTE ) DR tablet 750 mg  750 mg Oral Q12H Ji, Andrew, MD   750 mg at 04/29/24 9089   haloperidol  (HALDOL ) tablet 10 mg  10 mg Oral Q12H Ji, Andrew, MD   10 mg at 04/29/24 9089   hydrOXYzine  (ATARAX ) tablet 25 mg  25 mg Oral TID PRN White, Patrice L, NP   25 mg at 04/28/24 2002   magnesium  hydroxide (MILK OF MAGNESIA) suspension 30 mL  30 mL Oral Daily PRN White, Patrice L, NP       nicotine  polacrilex (NICORETTE ) gum 2 mg  2 mg Oral PRN Trudy Carwin, NP   2 mg at 04/23/24 1827   OLANZapine  (ZYPREXA ) injection 10 mg  10 mg Intramuscular TID PRN White, Patrice L, NP       OLANZapine  (ZYPREXA ) injection 5 mg  5 mg Intramuscular TID PRN White, Patrice L, NP       OLANZapine  zydis (ZYPREXA ) disintegrating tablet 5 mg  5 mg Oral TID PRN White, Patrice L, NP   5 mg at 04/18/24 0103   ondansetron  (ZOFRAN -ODT) disintegrating tablet 4 mg  4 mg Oral Q8H PRN Lynnette Barter, MD   4 mg at 04/02/24 1946   traZODone  (DESYREL ) tablet 50 mg  50 mg Oral QHS White, Patrice L, NP   50 mg at 04/28/24 2003   ziprasidone  (GEODON ) capsule 60 mg  60 mg Oral BID WC Marry Clamp, MD   60 mg at 04/29/24 9089    Lab Results:  No results found for this or any previous visit (from the past 48 hours).     Blood Alcohol level:  Lab  Results  Component Value Date   [facility] <15 02/17/2024   ETH <15 01/14/2024    Metabolic Disorder Labs: Lab Results  Component Value Date   HGBA1C 5.1 01/18/2024   MPG 99.67 01/18/2024   MPG 105.41 05/11/2019   Lab Results  Component Value Date   PROLACTIN 24.1 (H) 05/11/2019   Lab Results  Component Value Date   CHOL 154 01/18/2024   TRIG 180 (H) 01/18/2024   HDL 49 01/18/2024   CHOLHDL 3.1 01/18/2024   VLDL 36 01/18/2024   LDLCALC 69 01/18/2024   LDLCALC 82 05/11/2019    Physical Findings: AIMS:  ,  ,  ,  ,  ,  ,   CIWA:    COWS:     Musculoskeletal: Strength & Muscle Tone: within normal limits Gait & Station: normal Patient leans: N/A  Psychiatric Specialty Exam:  Presentation  General Appearance:  Casual  Eye Contact: Fair  Speech: Clear and Coherent; Normal Rate  Speech Volume: Normal  Handedness:No data recorded  Mood and Affect  Mood: Euthymic  Affect: Other (comment) (smiling)   Thought Process  Thought Processes: Linear  Descriptions of Associations:Loose  Orientation:Partial  Thought Content:Scattered  History of Schizophrenia/Schizoaffective disorder:No  Duration of Psychotic Symptoms:Greater than six months  Hallucinations:Hallucinations: None   Ideas of Reference:None  Suicidal Thoughts:Suicidal Thoughts: No   Homicidal Thoughts:Homicidal Thoughts: No    Sensorium  Memory: Immediate Poor; Recent Poor  Judgment: Impaired  Insight: Lacking   Executive Functions  Concentration: Fair  Attention Span: Fair  Recall: Poor  Fund of Knowledge: Poor  Language: Fair  Psychomotor Activity  Psychomotor Activity:Psychomotor Activity: Normal    Assets  Assets: Resilience   Sleep  Sleep:Sleep: Good     Physical Exam: Physical Exam Vitals and nursing note reviewed.  Constitutional:      General: She is not in acute distress.    Appearance: Normal appearance. She is normal weight. She is  not ill-appearing or toxic-appearing.  HENT:     Head: Normocephalic and atraumatic.  Pulmonary:     Effort: Pulmonary effort is normal.  Musculoskeletal:        General: Normal range of motion.  Neurological:     General: No focal deficit present.     Mental Status: She is alert.    Review of Systems  Respiratory:  Negative for cough and shortness of breath.   Cardiovascular:  Negative for chest pain.  Gastrointestinal:  Negative for abdominal pain, constipation, diarrhea, nausea and vomiting.  Neurological:  Negative for dizziness, weakness and headaches.  Psychiatric/Behavioral:  Negative for depression, hallucinations and suicidal ideas. The patient is not nervous/anxious.    Blood pressure 105/75, pulse 81, temperature (!) 97.2 F (36.2 C), temperature source Oral, resp. rate 16, height 5' 6 (1.676 m), weight 80.7 kg, SpO2 99%. Body mass index is 28.71 kg/m.   Treatment Plan Summary: Daily contact with patient to assess and evaluate symptoms and progress in treatment and Medication management  Allison Bolton is a 53 yr old female who presented on 02/17/24 to [facility] ED with a chief complaint of anxiety. In the ED, she was found to be acutely psychotic with tangential speech, agitation, and delusional thought content. Patient was admitted involuntarily to [facility] on 8/14.  PPHx is significant for Substance-Induced Psychosis, Bipolar I Disorder, Stimulant Use Disorder, GAD, Suicide Attempt (2007), and Prior Psychiatric Hospitalizations (last- [facility] 01/2024).   Allison Bolton remains stable from a psychiatric standpoint.  We await a group home bed for placement.  We will not make any changes to her medications at this time.  We will continue to monitor.   Substance-induced psychosis  Bipolar disorder: -Continue Haldol  10 mg q12 for psychosis and mood stability -Continue Geodon  60 mg BID for psychosis and mood stability -Continue Depakote  DR 750 mg q12 for mood stability -Continue  Cogentin  1 mg BID for Drug Induced EPS -Continue Agitation Protocol: Zyprexa    Dementia in other diseases classified elsewhere, unspecified severity, without behavioral disturbance, psychotic disturbance, mood disturbance, and anxiety: Kindal was given a MOCA on 9/17 and scored an 8.  [facility] SW is seeking a guardian for the patient as she needs 24 hour assistance at this point and they are looking for placement at either SNF or group home.     Nicotine  Dependence: -Continue Nicotine  Gum 2 mg PRN   UTI: -UA shows E. Coli resistant to Bactrim  -Completed Cipro 10/23   -Continue Zofran  4 mg q8 PRN nausea/vomiting -Continue PRN's: Tylenol , Maalox, Atarax , Milk of Magnesia, Trazodone    --  The risks/benefits/side-effects/alternatives to medications were discussed in detail with the patient and time was given for questions. The patient consents to medication trials.                -- Metabolic profile and EKG monitoring obtained while on an atypical antipsychotic (BMI: 28.73 Lipid Panel: WNL except Trig: 180 HbgA1c: 5.1)              -- Encouraged patient to participate in unit milieu and in scheduled group therapies              --  Short Term Goals: Ability to identify changes in lifestyle to reduce recurrence of condition will improve, Ability to verbalize feelings will improve, Ability to disclose and discuss suicidal ideas, Ability to demonstrate self-control will improve, Ability to identify and develop effective coping behaviors will improve, Ability to maintain clinical measurements within normal limits will improve, Compliance with prescribed medications will improve, and Ability to identify triggers associated with substance abuse/mental health issues will improve             -- Long Term Goals: Improvement in symptoms so as ready for discharge   Safety and Monitoring:             -- Involuntary admission to inpatient psychiatric unit for safety, stabilization and treatment              -- Daily contact with patient to assess and evaluate symptoms and progress in treatment             -- Patient's case to be discussed in multi-disciplinary team meeting             -- Observation Level : q15 minute checks             -- Vital signs:  q12 hours             -- Precautions: suicide, elopement, and assault  Discharge Planning:              -- Social work and case management to assist with discharge planning and identification of hospital follow-up needs prior to discharge             -- Estimated LOS: 7+ more days             -- Discharge Concerns: Need to establish a safety plan; Medication compliance and effectiveness             -- Discharge Goals: Return home with outpatient referrals for mental health follow-up including medication management/psychotherapy   Marsa GORMAN Rosser, DO 04/29/2024, 12:04 PM

## 2024-04-29 NOTE — Plan of Care (Signed)
   Problem: Education: Goal: Emotional status will improve Outcome: Progressing Goal: Mental status will improve Outcome: Progressing Goal: Verbalization of understanding the information provided will improve Outcome: Progressing   Problem: Activity: Goal: Interest or engagement in activities will improve Outcome: Progressing

## 2024-04-29 NOTE — Group Note (Signed)
 Recreation Therapy Group Note   Group Topic:Leisure Education  Group Date: 04/29/2024 Start Time: 1037 End Time: 1055 Facilitators: Iesha Summerhill-McCall, LRT,CTRS Location: [address] Dayroom   Group Topic: Leisure Education  Goal Area(s) Addresses:  Patient will identify positive leisure and recreation activities.  Patient will identify one positive benefit of participation in leisure activities.   Behavioral Response:   Intervention: Group Game  Activity: Lyrically Correct. Patients were divided in to 2 groups for game play. LRT used a deck of cards with various questions pertaining to the lyrics of songs. Some of the answers were given in multiple choice form while others were to be guesses. The team with the most right responses wins the game.    Education:  Teacher, English as a foreign language, Special educational needs teacher, Discharge Planning  Education Outcome: Acknowledges education/In group clarification offered/Needs additional education   Affect/Mood: N/A   Participation Level: Did not attend    Clinical Observations/Individualized Feedback:       Plan: Continue to engage patient in RT group sessions 2-3x/week.   Suezette Lafave-McCall, LRT,CTRS 04/29/2024 1:24 PM

## 2024-04-29 NOTE — Group Note (Signed)
 Date:  04/29/2024 Time:  8:50 PM  Group Topic/Focus:  Wrap-Up Group:   The focus of this group is to help patients review their daily goal of treatment and discuss progress on daily workbooks.    Participation Level:  Active  Participation Quality:  Appropriate  Affect:  Appropriate  Cognitive:  Lacking  Insight: Limited  Engagement in Group:  Engaged  Modes of Intervention:  Education and Exploration  Additional Comments:  Patient attended and participated in group tonight. She reports that today she learn nothing.  Gwenn Chillington Dacosta 04/29/2024, 8:50 PM

## 2024-04-29 NOTE — Plan of Care (Signed)
   Problem: Education: Goal: Knowledge of [facility] Education information/materials will improve Outcome: Progressing Goal: Emotional status will improve Outcome: Progressing Goal: Mental status will improve Outcome: Progressing Goal: Verbalization of understanding the information provided will improve Outcome: Progressing

## 2024-04-30 NOTE — H&P (Incomplete)
(  Sleep Hours) - 9.5 (Any PRNs that were needed, meds refused, or side effects to meds)-atarax  PRN (Any disturbances and when (visitation, over night) (Concerns raised by the patient)- none  (SI/HI/AVH)- denies

## 2024-04-30 NOTE — Group Note (Deleted)
 LCSW Group Therapy Note  Group Date: 04/30/2024 Start Time: 1000 End Time: 1100   Type of Therapy and Topic:  Group Therapy - Healthy vs Unhealthy Coping Skills  Participation Level:  {BHH PARTICIPATION OZCZO:[id number]}   Description of Group The focus of this group was to determine what unhealthy coping techniques typically are used by group members and what healthy coping techniques would be helpful in coping with various problems. Patients were guided in becoming aware of the differences between healthy and unhealthy coping techniques. Patients were asked to identify 2-3 healthy coping skills they would like to learn to use more effectively.  Therapeutic Goals Patients learned that coping is what human beings do all day long to deal with various situations in their lives Patients defined and discussed healthy vs unhealthy coping techniques Patients identified their preferred coping techniques and identified whether these were healthy or unhealthy Patients determined 2-3 healthy coping skills they would like to become more familiar with and use more often. Patients provided support and ideas to each other   Summary of Patient Progress:  During group, *** expressed ***. Patient proved open to input from peers and feedback from CSW. Patient demonstrated *** insight into the subject matter, was respectful of peers, and participated throughout the entire session.   Therapeutic Modalities Cognitive Behavioral Therapy Motivational Interviewing  Camelia Olden, LCSWA 04/30/2024  2:49 PM

## 2024-04-30 NOTE — Plan of Care (Signed)

## 2024-04-30 NOTE — Group Note (Signed)
 LCSW Group Therapy Note  Group Date: 04/30/2024 Start Time: 1000 End Time: 1045   Type of Therapy and Topic:  Group Therapy -self care tips  Participation Level:  Minimal   Description of Group The focus of this group was to determine what unhealthy coping techniques typically are used by group members and what healthy coping techniques would be helpful in coping with various problems. Patients were guided in becoming aware of the differences between healthy and unhealthy coping techniques. Patients were asked to identify 2-3 healthy coping skills they would like to learn to use more effectively.  Therapeutic Goals Patients learned that coping is what human beings do all day long to deal with various situations in their lives Patients defined and discussed healthy vs unhealthy coping techniques Patients identified their preferred coping techniques and identified whether these were healthy or unhealthy Patients determined 2-3 healthy coping skills they would like to become more familiar with and use more often. Patients provided support and ideas to each other   Summary of Patient Progress:  The patient was not active, she left right after ice breaker was done.    Therapeutic Modalities Cognitive Behavioral Therapy Motivational Interviewing  [name], LCSWA 04/30/2024  2:41 PM

## 2024-04-30 NOTE — Progress Notes (Signed)
 [facility] MD Progress Note  04/30/2024 10:54 AM Allison Bolton  MRN:  [id number] Subjective:   Allison Bolton is a 53 yr old female who presented on 02/17/24 to [facility] ED with a chief complaint of anxiety. In the ED, she was found to be acutely psychotic with tangential speech, agitation, and delusional thought content. Patient was admitted involuntarily to [facility] on 8/14.  PPHx is significant for Substance-Induced Psychosis, Bipolar I Disorder, Stimulant Use Disorder, GAD, Suicide Attempt (2007), and Prior Psychiatric Hospitalizations (last- [facility] 01/2024).   Case was discussed in the multidisciplinary team. MAR was reviewed and patient was compliant with medications.  She received PRN Hydroxyzine  last night.    Psychiatric Team made the following recommendations yesterday: -Continue Haldol  10 mg q12 for psychosis and mood stability -Continue Geodon  60 mg BID for psychosis and mood stability -Continue Depakote  DR 750 mg q12 for mood stability -Continue Cogentin  1 mg BID for Drug Induced EPS -Completed Cipro course    On interview today patient reports she slept good last night.  She reports her appetite is doing good.  She reports no SI, HI, or AVH.  She reports no Paranoia or Ideas of Reference.  She reports no issues with her medications.  She asks if she can be discharged.  Discussed with her that we are continuing to work on placement and that when this is secured she will know and she reported understanding.  She reports no other concerns at present.    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 The direct patient care time included  face-to-face time with the patient, reviewing the patient's chart, communicating with other professionals, and coordinating care.    Past Psychiatric History:  Substance-Induced Psychosis, Bipolar I Disorder, Stimulant Use Disorder, GAD, Suicide Attempt (2007), and Prior Psychiatric Hospitalizations (last- [facility] 01/2024).  Past Medical History:  Past Medical History:  Diagnosis Date   Anxiety    Arthritis    Asthma    Bipolar 1 disorder (HCC)    Diverticulosis    Dysrhythmia    sts I have heart palpitations   Fibromyalgia    H/O hiatal hernia    4   Headache(784.0)    High blood pressure    Meniere's disease    S/P colonoscopy    Dr. Golda 2010: few small diverticula at sigmoid. otherwise normal.    Shortness of breath     Past Surgical History:  Procedure Laterality Date   ABDOMINAL HYSTERECTOMY     APPENDECTOMY     CESAREAN SECTION     CHOLECYSTECTOMY     COLONOSCOPY N/A 12/25/2023   Procedure: COLONOSCOPY;  Surgeon: Eartha Angelia Sieving, MD;  Location: [facility];  Service: Gastroenterology;  Laterality: N/A;  11:30am, asa 1   exploratory laparoscopy     FOOT SURGERY     HEMORRHOID SURGERY     LAPAROSCOPIC APPENDECTOMY  02/19/2011   Procedure: APPENDECTOMY LAPAROSCOPIC;  Surgeon: Oneil DELENA Budge;  Location: [facility] ORS;  Service: General;  Laterality: N/A;   LAPAROSCOPY  02/19/2011   Procedure: LAPAROSCOPY DIAGNOSTIC;  Surgeon: Oneil DELENA Budge;  Location: [facility] ORS;  Service: General;  Laterality: N/A;   left ovarian removal  multiple hernia repairs     Right ovarian removal     TONSILLECTOMY     TONSILLECTOMY AND ADENOIDECTOMY     tubes in ears     UMBILICAL HERNIA REPAIR  Dec 2011   Dr. Mavis   wisdom teeth removal     Family History:  Family History  Problem Relation Age of Onset   Thyroid  disease Mother    Colon cancer Father    Breast cancer Maternal Aunt    Colon cancer Brother    Colon cancer Other        paternal and maternal grandfather    Meniere's disease Other    Anesthesia problems Neg Hx    Hypotension Neg Hx    Malignant hyperthermia Neg Hx    Pseudochol deficiency Neg Hx    Family Psychiatric  History:  None Reported   Social History:  Social History   Substance and Sexual Activity  Alcohol Use No   Comment: not since June 2012     Social History   Substance and Sexual Activity  Drug Use Yes   Types: Marijuana   Comment: patient denies any-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    Social History   Socioeconomic History   Marital status: Legally Separated    Spouse name: Not on file   Number of children: Not on file   Years of education: Not on file   Highest education level: Not on file  Occupational History   Not on file  Tobacco Use   Smoking status: Every Day    Current packs/day: 0.00    Average packs/day: 0.5 packs/day for 37.3 years (18.7 ttl pk-yrs)    Types: Cigarettes    Start date: 07/07/1981    Last attempt to quit: 11/05/2018    Years since quitting: 5.4   Smokeless tobacco: Never  Vaping Use   Vaping status: Never Used  Substance and Sexual Activity   Alcohol use: No    Comment: not since June 2012   Drug use: Yes    Types: Marijuana    Comment: patient denies any-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   Sexual activity: Never    Birth control/protection: Surgical  Other Topics Concern   Not on file  Social History Narrative   Not on file   Social Drivers of Health   Financial Resource Strain: Not on file  Food Insecurity: Food Insecurity Present (02/18/2024)   Hunger Vital Sign    Worried About Running Out of Food in the Last Year: Sometimes true    Ran Out of Food in the Last Year: Sometimes true  Transportation Needs: Unmet Transportation Needs (02/18/2024)   PRAPARE - Administrator, Civil Service (Medical): Yes    Lack of Transportation (Non-Medical): Yes  Physical Activity: Not on file  Stress: Not on file  Social Connections: Unknown (10/14/2021)    Received from [facility]   Social Connections    Do your friends and family support you?: Not on file    What agencies support you?: Not on file   Additional Social History:                         Sleep: Good Estimated Sleeping Duration (Last 24 Hours): 6.25-6.50 hours  Appetite:  Good  Current Medications: Current Facility-Administered Medications  Medication Dose Route Frequency Provider Last Rate Last Admin   acetaminophen  (TYLENOL ) tablet 650 mg  650 mg Oral Q6H PRN White, Patrice L, NP   650 mg at 04/29/24 0022   alum & mag hydroxide-simeth (MAALOX/MYLANTA) 200-200-20 MG/5ML suspension 30 mL  30 mL Oral Q4H PRN White, Patrice L, NP   30 mL at 04/25/24 2207   benztropine  (COGENTIN ) tablet 1 mg  1 mg Oral BID White, Patrice L, NP   1 mg at 04/30/24 9182   divalproex  (DEPAKOTE ) DR tablet 750 mg  750 mg Oral Q12H Ji, Andrew, MD   750 mg at 04/30/24 9182   haloperidol  (HALDOL ) tablet 10 mg  10 mg Oral Q12H Ji, Andrew, MD   10 mg at 04/30/24 9183   hydrOXYzine  (ATARAX ) tablet 25 mg  25 mg Oral TID PRN White, Patrice L, NP   25 mg at 04/29/24 2043   magnesium  hydroxide (MILK OF MAGNESIA) suspension 30 mL  30 mL Oral Daily PRN White, Patrice L, NP       nicotine  polacrilex (NICORETTE ) gum 2 mg  2 mg Oral PRN Trudy Carwin, NP   2 mg at 04/23/24 1827   OLANZapine  (ZYPREXA ) injection 10 mg  10 mg Intramuscular TID PRN White, Patrice L, NP       OLANZapine  (ZYPREXA ) injection 5 mg  5 mg Intramuscular TID PRN White, Patrice L, NP       OLANZapine  zydis (ZYPREXA ) disintegrating tablet 5 mg  5 mg Oral TID PRN White, Patrice L, NP   5 mg at 04/18/24 0103   ondansetron  (ZOFRAN -ODT) disintegrating tablet 4 mg  4 mg Oral Q8H PRN Lynnette Barter, MD   4 mg at 04/02/24 1946   traZODone  (DESYREL ) tablet 50 mg  50 mg Oral QHS White, Patrice L, NP   50 mg at 04/29/24 2043   ziprasidone  (GEODON ) capsule 60 mg  60 mg Oral BID WC Marry Clamp, MD   60 mg at 04/30/24 9183     Lab Results:  No results found for this or any previous visit (from the past 48 hours).     Blood Alcohol level:  Lab Results  Component Value Date   [facility] <15 02/17/2024   ETH <15 01/14/2024    Metabolic Disorder Labs: Lab Results  Component Value Date   HGBA1C 5.1 01/18/2024   MPG 99.67 01/18/2024   MPG 105.41 05/11/2019   Lab Results  Component Value Date   PROLACTIN 24.1 (H) 05/11/2019   Lab Results  Component Value Date   CHOL 154 01/18/2024   TRIG 180 (H) 01/18/2024   HDL 49 01/18/2024   CHOLHDL 3.1 01/18/2024   VLDL 36 01/18/2024   LDLCALC 69 01/18/2024   LDLCALC 82 05/11/2019    Physical Findings: AIMS:  ,  ,  ,  ,  ,  ,   CIWA:    COWS:     Musculoskeletal: Strength & Muscle Tone: within normal limits Gait & Station: normal Patient leans: N/A  Psychiatric Specialty Exam:  Presentation  General Appearance:  Casual  Eye Contact: Fair  Speech: Clear and Coherent; Normal Rate  Speech Volume: Normal  Handedness:No data recorded  Mood and Affect  Mood: Euthymic  Affect: Other (comment) (smiling)   Thought Process  Thought Processes: Linear  Descriptions of Associations:Loose  Orientation:Partial  Thought Content:Scattered  History of Schizophrenia/Schizoaffective disorder:No  Duration of Psychotic Symptoms:Greater than six months  Hallucinations:Hallucinations: None   Ideas of Reference:None  Suicidal Thoughts:Suicidal Thoughts: No   Homicidal Thoughts:Homicidal Thoughts: No    Sensorium  Memory: Immediate Poor  Judgment: Impaired  Insight:  Lacking   Executive Functions  Concentration: Fair  Attention Span: Fair  Recall: Poor  Fund of Knowledge: Poor  Language: Fair   Lexicographer Activity:Psychomotor Activity: Normal    Assets  Assets: Resilience   Sleep  Sleep:Sleep: Good     Physical Exam: Physical Exam Vitals and nursing note reviewed.   Constitutional:      General: She is not in acute distress.    Appearance: Normal appearance. She is normal weight. She is not ill-appearing or toxic-appearing.  HENT:     Head: Normocephalic and atraumatic.  Pulmonary:     Effort: Pulmonary effort is normal.  Musculoskeletal:        General: Normal range of motion.  Neurological:     General: No focal deficit present.     Mental Status: She is alert.    Review of Systems  Respiratory:  Negative for cough and shortness of breath.   Cardiovascular:  Negative for chest pain.  Gastrointestinal:  Negative for abdominal pain, constipation, diarrhea, nausea and vomiting.  Neurological:  Negative for dizziness, weakness and headaches.  Psychiatric/Behavioral:  Negative for depression, hallucinations and suicidal ideas. The patient is not nervous/anxious.    Blood pressure 124/66, pulse 93, temperature (!) 97.2 F (36.2 C), temperature source Oral, resp. rate 14, height 5' 6 (1.676 m), weight 80.7 kg, SpO2 93%. Body mass index is 28.71 kg/m.   Treatment Plan Summary: Daily contact with patient to assess and evaluate symptoms and progress in treatment and Medication management  Allison Bolton is a 53 yr old female who presented on 02/17/24 to [facility] ED with a chief complaint of anxiety. In the ED, she was found to be acutely psychotic with tangential speech, agitation, and delusional thought content. Patient was admitted involuntarily to [facility] on 8/14.  PPHx is significant for Substance-Induced Psychosis, Bipolar I Disorder, Stimulant Use Disorder, GAD, Suicide Attempt (2007), and Prior Psychiatric Hospitalizations (last- [facility] 01/2024).   Gresia remains stable from a psychiatric standpoint.  We await a group home bed for placement.  We will not make any changes to her medications at this time.  We will continue to monitor.   Substance-induced psychosis  Bipolar disorder: -Continue Haldol  10 mg q12 for psychosis and mood  stability -Continue Geodon  60 mg BID for psychosis and mood stability -Continue Depakote  DR 750 mg q12 for mood stability -Continue Cogentin  1 mg BID for Drug Induced EPS -Continue Agitation Protocol: Zyprexa    Dementia in other diseases classified elsewhere, unspecified severity, without behavioral disturbance, psychotic disturbance, mood disturbance, and anxiety: Allison Bolton was given a MOCA on 9/17 and scored an 8.  [facility] SW is seeking a guardian for the patient as she needs 24 hour assistance at this point and they are looking for placement at either SNF or group home.     Nicotine  Dependence: -Continue Nicotine  Gum 2 mg PRN   UTI: -UA shows E. Coli resistant to Bactrim  -Completed Cipro 10/23   -Continue Zofran  4 mg q8 PRN nausea/vomiting -Continue PRN's: Tylenol , Maalox, Atarax , Milk of Magnesia, Trazodone    --  The risks/benefits/side-effects/alternatives to medications were discussed in detail with the patient and time was given for questions. The patient consents to medication trials.                -- Metabolic profile and EKG monitoring obtained while on an atypical antipsychotic (BMI: 28.73 Lipid Panel: WNL except Trig: 180 HbgA1c: 5.1)              --  Encouraged patient to participate in unit milieu and in scheduled group therapies              -- Short Term Goals: Ability to identify changes in lifestyle to reduce recurrence of condition will improve, Ability to verbalize feelings will improve, Ability to disclose and discuss suicidal ideas, Ability to demonstrate self-control will improve, Ability to identify and develop effective coping behaviors will improve, Ability to maintain clinical measurements within normal limits will improve, Compliance with prescribed medications will improve, and Ability to identify triggers associated with substance abuse/mental health issues will improve             -- Long Term Goals: Improvement in symptoms so as ready for discharge   Safety  and Monitoring:             -- Involuntary admission to inpatient psychiatric unit for safety, stabilization and treatment             -- Daily contact with patient to assess and evaluate symptoms and progress in treatment             -- Patient's case to be discussed in multi-disciplinary team meeting             -- Observation Level : q15 minute checks             -- Vital signs:  q12 hours             -- Precautions: suicide, elopement, and assault  Discharge Planning:              -- Social work and case management to assist with discharge planning and identification of hospital follow-up needs prior to discharge             -- Estimated LOS: 7+ more days             -- Discharge Concerns: Need to establish a safety plan; Medication compliance and effectiveness             -- Discharge Goals: Return home with outpatient referrals for mental health follow-up including medication management/psychotherapy   Marsa GORMAN Rosser, DO 04/30/2024, 10:54 AM

## 2024-04-30 NOTE — [facility] Group Notes (Signed)
 Patient attended social work group.

## 2024-04-30 NOTE — Group Note (Signed)
 Date:  04/30/2024 Time:  8:38 AM  Group Topic/Focus:  Goals Group:   The focus of this group is to help patients establish daily goals to achieve during treatment and discuss how the patient can incorporate goal setting into their daily lives to aide in recovery.    Participation Level:  Active  Participation Quality:  Appropriate  Affect:  Appropriate  Cognitive:  Disorganized  Insight: Limited  Engagement in Group:  Engaged  Modes of Intervention:  Discussion  Additional Comments:  Allison Bolton stated that her goal for today is I'd like to make some spaghetti or Mexican food.  [name] C Valbona Slabach 04/30/2024, 8:38 AM

## 2024-04-30 NOTE — Progress Notes (Signed)
 Patient denies SI, AH, VH. Patient stated they slept good last night. Scored zero on anxiety and depression. Patient has been calm, cooperative, and med compliant.    Mercie VEAR Banana, RN  10:16 AM    04/30/24 0800  Psych Admission Type (Psych Patients Only)  Admission Status Involuntary  Psychosocial Assessment  Patient Complaints None  Eye Contact Fair  Facial Expression Animated  Affect Appropriate to circumstance  Speech Tangential  Interaction Childlike  Motor Activity Slow  Appearance/Hygiene Disheveled  Behavior Characteristics Cooperative;Calm  Mood Pleasant  Thought Process  Coherency Disorganized  Content Preoccupation  Delusions None reported or observed  Perception Derealization  Hallucination None reported or observed  Judgment Impaired  Confusion Moderate  Danger to Self  Current suicidal ideation? Denies  Agreement Not to Harm Self Yes  Description of Agreement Verbal  Danger to Others  Danger to Others None reported or observed

## 2024-04-30 NOTE — Group Note (Signed)
 Date:  04/30/2024 Time:  8:48 PM  Group Topic/Focus:  Wrap-Up Group:   The focus of this group is to help patients review their daily goal of treatment and discuss progress on daily workbooks.    Participation Level:  Active  Participation Quality:  Appropriate  Affect:  confused  Cognitive:  Lacking  Insight: Limited  Engagement in Group:  Engaged  Modes of Intervention:  Education and Exploration  Additional Comments:  Patient attended and participated in group tonight. She reports that today she did nothing.  Gwenn Chillington Dacosta 04/30/2024, 8:48 PM

## 2024-04-30 NOTE — Plan of Care (Signed)
   Problem: Education: Goal: Knowledge of [facility] Education information/materials will improve Outcome: Progressing Goal: Emotional status will improve Outcome: Progressing Goal: Mental status will improve Outcome: Progressing Goal: Verbalization of understanding the information provided will improve Outcome: Progressing   Problem: Activity: Goal: Interest or engagement in activities will improve Outcome: Progressing

## 2024-05-01 LAB — CBC WITH DIFFERENTIAL/PLATELET
Abs Immature Granulocytes: 0.01 K/uL (ref 0.00–0.07)
Basophils Absolute: 0.1 K/uL (ref 0.0–0.1)
Basophils Relative: 1 %
Eosinophils Absolute: 0.2 K/uL (ref 0.0–0.5)
Eosinophils Relative: 3 %
HCT: 44.2 % (ref 36.0–46.0)
Hemoglobin: 14.1 g/dL (ref 12.0–15.0)
Immature Granulocytes: 0 %
Lymphocytes Relative: 53 %
Lymphs Abs: 3.6 K/uL (ref 0.7–4.0)
MCH: 28.3 pg (ref 26.0–34.0)
MCHC: 31.9 g/dL (ref 30.0–36.0)
MCV: 88.8 fL (ref 80.0–100.0)
Monocytes Absolute: 0.5 K/uL (ref 0.1–1.0)
Monocytes Relative: 8 %
Neutro Abs: 2.4 K/uL (ref 1.7–7.7)
Neutrophils Relative %: 35 %
Platelets: 205 K/uL (ref 150–400)
RBC: 4.98 MIL/uL (ref 3.87–5.11)
RDW: 15.9 % — ABNORMAL HIGH (ref 11.5–15.5)
WBC: 6.8 K/uL (ref 4.0–10.5)
nRBC: 0 % (ref 0.0–0.2)

## 2024-05-01 LAB — COMPREHENSIVE METABOLIC PANEL WITH GFR
ALT: 15 U/L (ref 0–44)
AST: 20 U/L (ref 15–41)
Albumin: 4.4 g/dL (ref 3.5–5.0)
Alkaline Phosphatase: 47 U/L (ref 38–126)
Anion gap: 10 (ref 5–15)
BUN: 12 mg/dL (ref 6–20)
CO2: 27 mmol/L (ref 22–32)
Calcium: 9.6 mg/dL (ref 8.9–10.3)
Chloride: 104 mmol/L (ref 98–111)
Creatinine, Ser: 0.8 mg/dL (ref 0.44–1.00)
GFR, Estimated: >60 mL/min (ref >60)
Glucose, Bld: 84 mg/dL (ref 70–99)
Potassium: 3.8 mmol/L (ref 3.5–5.1)
Sodium: 141 mmol/L (ref 135–145)
Total Bilirubin: 0.5 mg/dL (ref 0.0–1.2)
Total Protein: 7 g/dL (ref 6.5–8.1)

## 2024-05-01 LAB — VALPROIC ACID LEVEL: Valproic Acid Lvl: 79 ug/mL (ref 50–100)

## 2024-05-01 NOTE — [facility] Group Notes (Signed)
 Adult Psychoeducational Group Note  Date:  05/01/2024 Time:  10:29 AM  Group Topic/Focus:  Goals Group:   The focus of this group is to help patients establish daily goals to achieve during treatment and discuss how the patient can incorporate goal setting into their daily lives to aide in recovery.  Participation Level:  Did Not Attend  Annett Redman Ramsay 05/01/2024, 10:29 AM

## 2024-05-01 NOTE — Plan of Care (Signed)

## 2024-05-01 NOTE — Plan of Care (Signed)
   Problem: Health Behavior/Discharge Planning: Goal: Compliance with treatment plan for underlying cause of condition will improve Outcome: Progressing   Problem: Safety: Goal: Periods of time without injury will increase Outcome: Progressing

## 2024-05-01 NOTE — Progress Notes (Signed)
(  Sleep Hours) -8.5 (Any PRNs that were needed, meds refused, or side effects to meds)- Tylenol  (Any disturbances and when (visitation, over night)-none (Concerns raised by the patient)- none (SI/HI/AVH)-denied

## 2024-05-01 NOTE — Group Note (Signed)
 Date:  05/01/2024 Time:  8:19 PM  Group Topic/Focus:  Wrap-Up Group:   The focus of this group is to help patients review their daily goal of treatment and discuss progress on daily workbooks.    Participation Level:  Did Not Attend   Torra Pala Dacosta 05/01/2024, 8:19 PM

## 2024-05-01 NOTE — Progress Notes (Signed)
 [facility] MD Progress Note  05/01/2024 3:29 PM Allison Bolton  MRN:  [id number] Subjective:   Allison Bolton is a 53 yr old female who presented on 02/17/24 to [facility] ED with a chief complaint of anxiety. In the ED, she was found to be acutely psychotic with tangential speech, agitation, and delusional thought content. Patient was admitted involuntarily to [facility] on 8/14.  PPHx is significant for Substance-Induced Psychosis, Bipolar I Disorder, Stimulant Use Disorder, GAD, Suicide Attempt (2007), and Prior Psychiatric Hospitalizations (last- [facility] 01/2024).   Case was discussed in the multidisciplinary team. MAR was reviewed and patient was compliant with medications.  She received PRN Hydroxyzine  last night.    Psychiatric Team made the following recommendations yesterday: -Continue Haldol  10 mg q12 for psychosis and mood stability -Continue Geodon  60 mg BID for psychosis and mood stability -Continue Depakote  DR 750 mg q12 for mood stability -Continue Cogentin  1 mg BID for Drug Induced EPS -Completed Cipro course    On interview today patient reports she slept good last night.  She reports her appetite is doing good.  She reports no SI, HI, or AVH.  She reports no Paranoia or Ideas of Reference.  She reports no issues with her medications.  She asks if she is being discharged.  Discussed with her we did not have a confirmed date yet but when this is set she will be told first and she was thankful for this.  She reports no other concerns at present.    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 The direct patient care time included  face-to-face time with the patient, reviewing the patient's chart, communicating with other professionals, and coordinating care.    Past Psychiatric History:  Substance-Induced Psychosis, Bipolar I Disorder, Stimulant Use Disorder, GAD, Suicide Attempt (2007), and Prior Psychiatric Hospitalizations (last- [facility] 01/2024).  Past Medical History:  Past Medical History:  Diagnosis Date   Anxiety    Arthritis    Asthma    Bipolar 1 disorder (HCC)    Diverticulosis    Dysrhythmia    sts I have heart palpitations   Fibromyalgia    H/O hiatal hernia    4   Headache(784.0)    High blood pressure    Meniere's disease    S/P colonoscopy    Dr. Golda 2010: few small diverticula at sigmoid. otherwise normal.    Shortness of breath     Past Surgical History:  Procedure Laterality Date   ABDOMINAL HYSTERECTOMY     APPENDECTOMY     CESAREAN SECTION     CHOLECYSTECTOMY     COLONOSCOPY N/A 12/25/2023   Procedure: COLONOSCOPY;  Surgeon: Eartha Angelia Sieving, MD;  Location: [facility] ENDO [facility];  Service: Gastroenterology;  Laterality: N/A;  11:30am, asa 1   exploratory laparoscopy     FOOT SURGERY     HEMORRHOID SURGERY     LAPAROSCOPIC APPENDECTOMY  02/19/2011   Procedure: APPENDECTOMY LAPAROSCOPIC;  Surgeon: Oneil DELENA Budge;  Location: [facility] ORS;  Service: General;  Laterality: N/A;   LAPAROSCOPY  02/19/2011   Procedure: LAPAROSCOPY DIAGNOSTIC;  Surgeon: Oneil DELENA Budge;  Location: [facility] ORS;  Service: General;  Laterality: N/A;   left ovarian removal  multiple hernia repairs     Right ovarian removal     TONSILLECTOMY     TONSILLECTOMY AND ADENOIDECTOMY     tubes in ears     UMBILICAL HERNIA REPAIR  Dec 2011   Dr. Mavis   wisdom teeth removal     Family History:  Family History  Problem Relation Age of Onset   Thyroid  disease Mother    Colon cancer Father    Breast cancer Maternal Aunt    Colon cancer Brother    Colon cancer Other        paternal and maternal grandfather    Meniere's disease Other    Anesthesia problems Neg Hx    Hypotension Neg Hx    Malignant hyperthermia Neg Hx    Pseudochol deficiency Neg Hx    Family Psychiatric  History:  None Reported   Social History:  Social History   Substance and Sexual Activity  Alcohol Use No   Comment: not since June 2012     Social History   Substance and Sexual Activity  Drug Use Yes   Types: Marijuana   Comment: patient denies any-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    Social History   Socioeconomic History   Marital status: Legally Separated    Spouse name: Not on file   Number of children: Not on file   Years of education: Not on file   Highest education level: Not on file  Occupational History   Not on file  Tobacco Use   Smoking status: Every Day    Current packs/day: 0.00    Average packs/day: 0.5 packs/day for 37.3 years (18.7 ttl pk-yrs)    Types: Cigarettes    Start date: 07/07/1981    Last attempt to quit: 11/05/2018    Years since quitting: 5.4   Smokeless tobacco: Never  Vaping Use   Vaping status: Never Used  Substance and Sexual Activity   Alcohol use: No    Comment: not since June 2012   Drug use: Yes    Types: Marijuana    Comment: patient denies any-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   Sexual activity: Never    Birth control/protection: Surgical  Other Topics Concern   Not on file  Social History Narrative   Not on file   Social Drivers of Health   Financial Resource Strain: Not on file  Food Insecurity: Food Insecurity Present (02/18/2024)   Hunger Vital Sign    Worried About Running Out of Food in the Last Year: Sometimes true    Ran Out of Food in the Last Year: Sometimes true  Transportation Needs: Unmet Transportation Needs (02/18/2024)   PRAPARE - Administrator, Civil Service (Medical): Yes    Lack of Transportation (Non-Medical): Yes  Physical Activity: Not on file  Stress: Not on file  Social Connections: Unknown (10/14/2021)    Received from [facility]   Social Connections    Do your friends and family support you?: Not on file    What agencies support you?: Not on file   Additional Social History:                         Sleep: Good Estimated Sleeping Duration (Last 24 Hours): 7.50-9.50 hours  Appetite:  Good  Current Medications: Current Facility-Administered Medications  Medication Dose Route Frequency Provider Last Rate Last Admin   acetaminophen  (TYLENOL ) tablet 650 mg  650 mg Oral Q6H PRN White, Patrice L, NP   650 mg at 04/30/24 2008   alum & mag hydroxide-simeth (MAALOX/MYLANTA) 200-200-20 MG/5ML suspension 30 mL  30 mL Oral Q4H PRN White, Patrice L, NP   30 mL at 04/25/24 2207   benztropine  (COGENTIN ) tablet 1 mg  1 mg Oral BID White, Patrice L, NP   1 mg at 05/01/24 0815   divalproex  (DEPAKOTE ) DR tablet 750 mg  750 mg Oral Q12H Ji, Andrew, MD   750 mg at 05/01/24 0815   haloperidol  (HALDOL ) tablet 10 mg  10 mg Oral Q12H Ji, Andrew, MD   10 mg at 05/01/24 0815   hydrOXYzine  (ATARAX ) tablet 25 mg  25 mg Oral TID PRN White, Patrice L, NP   25 mg at 04/29/24 2043   magnesium  hydroxide (MILK OF MAGNESIA) suspension 30 mL  30 mL Oral Daily PRN White, Patrice L, NP       nicotine  polacrilex (NICORETTE ) gum 2 mg  2 mg Oral PRN Trudy Carwin, NP   2 mg at 04/23/24 1827   OLANZapine  (ZYPREXA ) injection 10 mg  10 mg Intramuscular TID PRN White, Patrice L, NP       OLANZapine  (ZYPREXA ) injection 5 mg  5 mg Intramuscular TID PRN White, Patrice L, NP       OLANZapine  zydis (ZYPREXA ) disintegrating tablet 5 mg  5 mg Oral TID PRN Teresa Jes L, NP   5 mg at 04/18/24 0103   ondansetron  (ZOFRAN -ODT) disintegrating tablet 4 mg  4 mg Oral Q8H PRN Lynnette Barter, MD   4 mg at 04/02/24 1946   traZODone  (DESYREL ) tablet 50 mg  50 mg Oral QHS White, Patrice L, NP   50 mg at 04/30/24 2008   ziprasidone  (GEODON ) capsule 60 mg  60 mg Oral BID WC Marry Clamp, MD   60 mg at 05/01/24 0815     Lab Results:  Results for orders placed or performed during the hospital encounter of 02/18/24 (from the past 48 hours)  Comprehensive metabolic panel with GFR     Status: None   Collection Time: 05/01/24  6:22 AM  Result Value Ref Range   Sodium 141 135 - 145 mmol/L   Potassium 3.8 3.5 - 5.1 mmol/L   Chloride 104 98 - 111 mmol/L   CO2 27 22 - 32 mmol/L   Glucose, Bld 84 70 - 99 mg/dL    Comment: Glucose reference range applies only to samples taken after fasting for at least 8 hours.   BUN 12 6 - 20 mg/dL   Creatinine, Ser 9.19 0.44 - 1.00 mg/dL   Calcium 9.6 8.9 - 89.6 mg/dL   Total Protein 7.0 6.5 - 8.1 g/dL   Albumin 4.4 3.5 - 5.0 g/dL   AST 20 15 - 41 U/L   ALT 15 0 - 44 U/L   Alkaline Phosphatase 47 38 - 126 U/L   Total Bilirubin 0.5 0.0 - 1.2 mg/dL   GFR, Estimated >39 >39 mL/min    Comment: (NOTE) Calculated using the CKD-EPI Creatinine Equation (2021)    Anion gap 10 5 - 15    Comment: Performed at [facility], [address] [place], [place] [address]  CBC with Differential     Status: Abnormal   Collection Time: 05/01/24  6:22 AM  Result Value Ref Range   WBC 6.8 4.0 - 10.5 K/uL   RBC 4.98 3.87 - 5.11 MIL/uL   Hemoglobin 14.1 12.0 - 15.0 g/dL  HCT 44.2 36.0 - 46.0 %   MCV 88.8 80.0 - 100.0 fL   MCH 28.3 26.0 - 34.0 pg   MCHC 31.9 30.0 - 36.0 g/dL   RDW 84.0 (H) 88.4 - 84.4 %   Platelets 205 150 - 400 K/uL   nRBC 0.0 0.0 - 0.2 %   Neutrophils Relative % 35 %   Neutro Abs 2.4 1.7 - 7.7 K/uL   Lymphocytes Relative 53 %   Lymphs Abs 3.6 0.7 - 4.0 K/uL   Monocytes Relative 8 %   Monocytes Absolute 0.5 0.1 - 1.0 K/uL   Eosinophils Relative 3 %   Eosinophils Absolute 0.2 0.0 - 0.5 K/uL   Basophils Relative 1 %   Basophils Absolute 0.1 0.0 - 0.1 K/uL   Immature Granulocytes 0 %   Abs Immature Granulocytes 0.01 0.00 - 0.07 K/uL    Comment: Performed at [facility], [address], [place], [place] [address]   Valproic  acid level     Status: None   Collection Time: 05/01/24  6:22 AM  Result Value Ref Range   Valproic  Acid Lvl 79 50 - 100 ug/mL    Comment: Performed at [facility], [address], [place], [place] [address]       Blood Alcohol level:  Lab Results  Component Value Date   [facility] <15 02/17/2024   ETH <15 01/14/2024    Metabolic Disorder Labs: Lab Results  Component Value Date   HGBA1C 5.1 01/18/2024   MPG 99.67 01/18/2024   MPG 105.41 05/11/2019   Lab Results  Component Value Date   PROLACTIN 24.1 (H) 05/11/2019   Lab Results  Component Value Date   CHOL 154 01/18/2024   TRIG 180 (H) 01/18/2024   HDL 49 01/18/2024   CHOLHDL 3.1 01/18/2024   VLDL 36 01/18/2024   LDLCALC 69 01/18/2024   LDLCALC 82 05/11/2019    Physical Findings: AIMS:  ,  ,  ,  ,  ,  ,   CIWA:    COWS:     Musculoskeletal: Strength & Muscle Tone: within normal limits Gait & Station: normal Patient leans: N/A  Psychiatric Specialty Exam:  Presentation  General Appearance:  Casual  Eye Contact: Fair  Speech: Clear and Coherent; Normal Rate  Speech Volume: Normal  Handedness:No data recorded  Mood and Affect  Mood: Euthymic  Affect: Other (comment) (smiling)   Thought Process  Thought Processes: Linear  Descriptions of Associations:Loose  Orientation:Partial  Thought Content:Scattered  History of Schizophrenia/Schizoaffective disorder:No  Duration of Psychotic Symptoms:Greater than six months  Hallucinations:Hallucinations: None   Ideas of Reference:None  Suicidal Thoughts:Suicidal Thoughts: No   Homicidal Thoughts:Homicidal Thoughts: No    Sensorium  Memory: Immediate Poor  Judgment: Impaired  Insight: Lacking   Executive Functions  Concentration: Fair  Attention Span: Fair  Recall: Poor  Fund of Knowledge: Poor  Language: Fair   Psychomotor Activity  Psychomotor Activity:Psychomotor Activity:  Normal    Assets  Assets: Resilience   Sleep  Sleep:Sleep: Good     Physical Exam: Physical Exam Vitals and nursing note reviewed.  Constitutional:      General: She is not in acute distress.    Appearance: Normal appearance. She is normal weight. She is not ill-appearing or toxic-appearing.  HENT:     Head: Normocephalic and atraumatic.  Pulmonary:     Effort: Pulmonary effort is normal.  Musculoskeletal:        General: Normal range of motion.  Neurological:  General: No focal deficit present.     Mental Status: She is alert.    Review of Systems  Respiratory:  Negative for cough and shortness of breath.   Cardiovascular:  Negative for chest pain.  Gastrointestinal:  Negative for abdominal pain, constipation, diarrhea, nausea and vomiting.  Neurological:  Negative for dizziness, weakness and headaches.  Psychiatric/Behavioral:  Negative for depression, hallucinations and suicidal ideas. The patient is not nervous/anxious.    Blood pressure 91/78, pulse 72, temperature (!) 97.2 F (36.2 C), temperature source Oral, resp. rate 14, height 5' 6 (1.676 m), weight 80.7 kg, SpO2 94%. Body mass index is 28.71 kg/m.   Treatment Plan Summary: Daily contact with patient to assess and evaluate symptoms and progress in treatment and Medication management  Allison Bolton is a 53 yr old female who presented on 02/17/24 to [facility] ED with a chief complaint of anxiety. In the ED, she was found to be acutely psychotic with tangential speech, agitation, and delusional thought content. Patient was admitted involuntarily to [facility] on 8/14.  PPHx is significant for Substance-Induced Psychosis, Bipolar I Disorder, Stimulant Use Disorder, GAD, Suicide Attempt (2007), and Prior Psychiatric Hospitalizations (last- [facility] 01/2024).   Allison Bolton remains stable from a psychiatric standpoint.  We await a group home bed for placement.  We will not make any changes to her medications at this  time.  We will continue to monitor.   Substance-induced psychosis  Bipolar disorder: -Continue Haldol  10 mg q12 for psychosis and mood stability -Continue Geodon  60 mg BID for psychosis and mood stability -Continue Depakote  DR 750 mg q12 for mood stability -Continue Cogentin  1 mg BID for Drug Induced EPS -Continue Agitation Protocol: Zyprexa    Dementia in other diseases classified elsewhere, unspecified severity, without behavioral disturbance, psychotic disturbance, mood disturbance, and anxiety: Kellsey was given a MOCA on 9/17 and scored an 8.  [facility] SW is seeking a guardian for the patient as she needs 24 hour assistance at this point and they are looking for placement at either SNF or group home.     Nicotine  Dependence: -Continue Nicotine  Gum 2 mg PRN   UTI: -UA shows E. Coli resistant to Bactrim  -Completed Cipro 10/23   -Continue Zofran  4 mg q8 PRN nausea/vomiting -Continue PRN's: Tylenol , Maalox, Atarax , Milk of Magnesia, Trazodone    --  The risks/benefits/side-effects/alternatives to medications were discussed in detail with the patient and time was given for questions. The patient consents to medication trials.                -- Metabolic profile and EKG monitoring obtained while on an atypical antipsychotic (BMI: 28.73 Lipid Panel: WNL except Trig: 180 HbgA1c: 5.1)              -- Encouraged patient to participate in unit milieu and in scheduled group therapies              -- Short Term Goals: Ability to identify changes in lifestyle to reduce recurrence of condition will improve, Ability to verbalize feelings will improve, Ability to disclose and discuss suicidal ideas, Ability to demonstrate self-control will improve, Ability to identify and develop effective coping behaviors will improve, Ability to maintain clinical measurements within normal limits will improve, Compliance with prescribed medications will improve, and Ability to identify triggers associated with  substance abuse/mental health issues will improve             -- Long Term Goals: Improvement in symptoms so as ready for discharge  Safety and Monitoring:             -- Involuntary admission to inpatient psychiatric unit for safety, stabilization and treatment             -- Daily contact with patient to assess and evaluate symptoms and progress in treatment             -- Patient's case to be discussed in multi-disciplinary team meeting             -- Observation Level : q15 minute checks             -- Vital signs:  q12 hours             -- Precautions: suicide, elopement, and assault  Discharge Planning:              -- Social work and case management to assist with discharge planning and identification of hospital follow-up needs prior to discharge             -- Estimated LOS: 7+ more days             -- Discharge Concerns: Need to establish a safety plan; Medication compliance and effectiveness             -- Discharge Goals: Return home with outpatient referrals for mental health follow-up including medication management/psychotherapy   Marsa GORMAN Rosser, DO 05/01/2024, 3:29 PM

## 2024-05-02 MED ORDER — TRAZODONE HCL 50 MG PO TABS
50.0000 mg | ORAL_TABLET | Freq: Once | ORAL | Status: AC
  Administered 2024-05-02: 50 mg via ORAL
  Filled 2024-05-02: qty 1

## 2024-05-02 NOTE — Group Note (Signed)
 Recreation Therapy Group Note   Group Topic:Leisure Education  Group Date: 05/02/2024 Start Time: 1030 End Time: 1100 Facilitators: Jeremy Ditullio-McCall, LRT,CTRS Location: [address] Dayroom   Group Topic: Leisure Education   Goal Area(s) Addresses:  Patient will successfully identify positive leisure and recreation activities.  Patient will acknowledge benefits of participation in healthy leisure activities post discharge.  Patient will actively work with peers toward a shared goal.   Behavioral Response:    Intervention: Competitive Group Game    Activity: Keep It Contractor. Patients were spread out throughout the room and were to remain seated at all times. Patients were to hit the beach ball to each other as if playing volleyball. Patients were to keep the ball going for as long as they could. Patients would be timed throughout the activity and if the ball came to a complete stop, the time would start over.    Education:  Teacher, English As A Foreign Language, Leisure as [employer]chant [employer] Officer, Programmer, Applications, Building Control Surveyor   Education Outcome: Acknowledges education/In group clarification offered/Needs additional education   Clinical Observations/Individualized Feedback: LRT did not conduct group due to lack of staffing. LRT assisted with dayroom.     Plan: Continue to engage patient in RT group sessions 2-3x/week.   Allison Bolton, LRT,CTRS 05/02/2024 12:38 PM

## 2024-05-02 NOTE — Progress Notes (Signed)
   05/01/24 2335  Psych Admission Type (Psych Patients Only)  Admission Status Involuntary  Psychosocial Assessment  Patient Complaints None  Eye Contact Fair  Facial Expression Animated  Affect Appropriate to circumstance  Speech Tangential  Interaction Childlike  Motor Activity Slow  Appearance/Hygiene Disheveled  Behavior Characteristics Cooperative  Mood Pleasant  Aggressive Behavior  Effect No apparent injury  Thought Process  Coherency Disorganized  Content Preoccupation  Delusions None reported or observed  Perception Derealization  Hallucination None reported or observed  Judgment Impaired  Confusion Moderate  Danger to Self  Current suicidal ideation? Denies

## 2024-05-02 NOTE — Group Note (Signed)
 LCSW Group Therapy Note   Group Date: 05/02/2024 Start Time: 1100 End Time: 1200   Participation:  patient was present.  She listened and was respectful but didn't participate in the discussion.  Type of Therapy:  Group Therapy  Topic:  From "One Day" to "Today is Day One": Begin Your Journey to Better Health and Mental Well-Being  Objective:  To educate participants on the importance of routine, sleep, diet, and movement for improving mental health and overall well-being. Encourage goal-setting for small, achievable changes.  3 Goals: Encourage participants to set one small, achievable goal related to sleep, diet, or movement for the coming week. Promote self-awareness by discussing the connection between physical and mental health. Support accountability by having participants share goals with the group.  Summary: Participants engaged in a discussion about lifestyle factors influencing mental health, including routines, sleep, nutrition, and movement. They set personal goals for the week to improve well-being and shared their goals for accountability.  Therapeutic Modalities: Cognitive Behavioral Therapy (CBT) for exploring the relationship between thoughts, feelings, and behaviors. Psychoeducation on lifestyle changes and their impact on mental health. Goal-setting to promote empowerment and motivation.   Astin Rape O Ranny Wiebelhaus, LCSWA 05/02/2024  4:35 PM

## 2024-05-02 NOTE — Progress Notes (Signed)
 [facility] MD Progress Note  05/02/2024 1:08 PM Allison Bolton  MRN:  [id number] Subjective:   Allison Bolton is a 53 yr old female who presented on 02/17/24 to [facility] ED with a chief complaint of anxiety. In the ED, she was found to be acutely psychotic with tangential speech, agitation, and delusional thought content. Patient was admitted involuntarily to [facility] on 8/14.  PPHx is significant for Substance-Induced Psychosis, Bipolar I Disorder, Stimulant Use Disorder, GAD, Suicide Attempt (2007), and Prior Psychiatric Hospitalizations (last- [facility] 01/2024).   Case was discussed in the multidisciplinary team. MAR was reviewed and patient was compliant with medications.  She received no PRN medications yesterday.   Psychiatric Team made the following recommendations yesterday: -Continue Haldol  10 mg q12 for psychosis and mood stability -Continue Geodon  60 mg BID for psychosis and mood stability -Continue Depakote  DR 750 mg q12 for mood stability -Continue Cogentin  1 mg BID for Drug Induced EPS    On interview today patient reports she slept good last night.  She reports her appetite is doing good.  She reports no SI, HI, or AVH.  She reports no Paranoia or Ideas of Reference.  She reports no issues with her medications.  She again asks if she is going home today because she wants to be discharged.  Discussed with her that we are still working on finding placement and that when we have an update she will be informed and she was thankful for this.  She reports no other concerns at present.    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 The direct  patient care time included face-to-face time with the patient, reviewing the patient's chart, communicating with other professionals, and coordinating care.    Past Psychiatric History:  Substance-Induced Psychosis, Bipolar I Disorder, Stimulant Use Disorder, GAD, Suicide Attempt (2007), and Prior Psychiatric Hospitalizations (last- [facility] 01/2024).  Past Medical History:  Past Medical History:  Diagnosis Date   Anxiety    Arthritis    Asthma    Bipolar 1 disorder (HCC)    Diverticulosis    Dysrhythmia    sts I have heart palpitations   Fibromyalgia    H/O hiatal hernia    4   Headache(784.0)    High blood pressure    Meniere's disease    S/P colonoscopy    Dr. Golda 2010: few small diverticula at sigmoid. otherwise normal.    Shortness of breath     Past Surgical History:  Procedure Laterality Date   ABDOMINAL HYSTERECTOMY     APPENDECTOMY     CESAREAN SECTION     CHOLECYSTECTOMY     COLONOSCOPY N/A 12/25/2023   Procedure: COLONOSCOPY;  Surgeon: Eartha Angelia Sieving, MD;  Location: [facility] ENDO SU[facility];  Service: Gastroenterology;  Laterality: N/A;  11:30am, asa 1   exploratory laparoscopy     FOOT SURGERY     HEMORRHOID SURGERY     LAPAROSCOPIC APPENDECTOMY  02/19/2011   Procedure: APPENDECTOMY LAPAROSCOPIC;  Surgeon: Oneil DELENA Budge;  Location: [facility] ORS;  Service: General;  Laterality: N/A;   LAPAROSCOPY  02/19/2011   Procedure: LAPAROSCOPY DIAGNOSTIC;  Surgeon: Oneil DELENA Budge;  Location: [facility] ORS;  Service: General;  Laterality: N/A;  left ovarian removal     multiple hernia repairs     Right ovarian removal     TONSILLECTOMY     TONSILLECTOMY AND ADENOIDECTOMY     tubes in ears     UMBILICAL HERNIA REPAIR  Dec 2011   Dr. Mavis   wisdom teeth removal     Family History:  Family History  Problem Relation Age of Onset   Thyroid  disease Mother    Colon cancer Father    Breast cancer Maternal Aunt    Colon cancer Brother    Colon cancer Other        paternal and  maternal grandfather   Meniere's disease Other    Anesthesia problems Neg Hx    Hypotension Neg Hx    Malignant hyperthermia Neg Hx    Pseudochol deficiency Neg Hx    Family Psychiatric  History:  None Reported   Social History:  Social History   Substance and Sexual Activity  Alcohol Use No   Comment: not since June 2012     Social History   Substance and Sexual Activity  Drug Use Yes   Types: Marijuana   Comment: patient denies any-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    Social History   Socioeconomic History   Marital status: Legally Separated    Spouse name: Not on file   Number of children: Not on file   Years of education: Not on file   Highest education level: Not on file  Occupational History   Not on file  Tobacco Use   Smoking status: Every Day    Current packs/day: 0.00    Average packs/day: 0.5 packs/day for 37.3 years (18.7 ttl pk-yrs)    Types: Cigarettes    Start date: 07/07/1981    Last attempt to quit: 11/05/2018    Years since quitting: 5.4   Smokeless tobacco: Never  Vaping Use   Vaping status: Never Used  Substance and Sexual Activity   Alcohol use: No    Comment: not since June 2012   Drug use: Yes    Types: Marijuana    Comment: patient denies any-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   Sexual activity: Never    Birth control/protection: Surgical  Other Topics Concern   Not on file  Social History Narrative   Not on file   Social Drivers of Health   Financial Resource Strain: Not on file  Food Insecurity: Food Insecurity Present (02/18/2024)   Hunger Vital Sign    Worried About Running Out of Food in the Last Year: Sometimes true    Ran Out of Food in the Last Year: Sometimes true  Transportation Needs: Unmet Transportation Needs (02/18/2024)   PRAPARE - Administrator, Civil Service (Medical): Yes    Lack of Transportation (Non-Medical): Yes  Physical Activity: Not on file  Stress: Not on file  Social  Connections: Unknown (10/14/2021)   Received from [facility]   Social Connections    Do your friends and family support you?: Not on file    What agencies support you?: Not on file   Additional Social History:                         Sleep: Good Estimated Sleeping Duration (Last 24 Hours): 6.50-8.50 hours  Appetite:  Good  Current Medications: Current Facility-Administered Medications  Medication Dose Route Frequency Provider Last Rate Last Admin  acetaminophen  (TYLENOL ) tablet 650 mg  650 mg Oral Q6H PRN White, Patrice L, NP   650 mg at 04/30/24 2008   alum & mag hydroxide-simeth (MAALOX/MYLANTA) 200-200-20 MG/5ML suspension 30 mL  30 mL Oral Q4H PRN White, Patrice L, NP   30 mL at 04/25/24 2207   benztropine  (COGENTIN ) tablet 1 mg  1 mg Oral BID White, Patrice L, NP   1 mg at 05/02/24 0827   divalproex  (DEPAKOTE ) DR tablet 750 mg  750 mg Oral Q12H Ji, Andrew, MD   750 mg at 05/02/24 9173   haloperidol  (HALDOL ) tablet 10 mg  10 mg Oral Q12H Ji, Andrew, MD   10 mg at 05/02/24 0827   hydrOXYzine  (ATARAX ) tablet 25 mg  25 mg Oral TID PRN White, Patrice L, NP   25 mg at 04/29/24 2043   magnesium  hydroxide (MILK OF MAGNESIA) suspension 30 mL  30 mL Oral Daily PRN White, Patrice L, NP       nicotine  polacrilex (NICORETTE ) gum 2 mg  2 mg Oral PRN Trudy Carwin, NP   2 mg at 04/23/24 1827   OLANZapine  (ZYPREXA ) injection 10 mg  10 mg Intramuscular TID PRN White, Patrice L, NP       OLANZapine  (ZYPREXA ) injection 5 mg  5 mg Intramuscular TID PRN White, Patrice L, NP       OLANZapine  zydis (ZYPREXA ) disintegrating tablet 5 mg  5 mg Oral TID PRN Teresa Jes L, NP   5 mg at 04/18/24 0103   ondansetron  (ZOFRAN -ODT) disintegrating tablet 4 mg  4 mg Oral Q8H PRN Lynnette Barter, MD   4 mg at 04/02/24 1946   traZODone  (DESYREL ) tablet 50 mg  50 mg Oral QHS White, Patrice L, NP   50 mg at 05/01/24 2115   ziprasidone  (GEODON ) capsule 60 mg  60 mg Oral BID WC Marry Clamp,  MD   60 mg at 05/02/24 0827    Lab Results:  Results for orders placed or performed during the hospital encounter of 02/18/24 (from the past 48 hours)  Comprehensive metabolic panel with GFR     Status: None   Collection Time: 05/01/24  6:22 AM  Result Value Ref Range   Sodium 141 135 - 145 mmol/L   Potassium 3.8 3.5 - 5.1 mmol/L   Chloride 104 98 - 111 mmol/L   CO2 27 22 - 32 mmol/L   Glucose, Bld 84 70 - 99 mg/dL    Comment: Glucose reference range applies only to samples taken after fasting for at least 8 hours.   BUN 12 6 - 20 mg/dL   Creatinine, Ser 9.19 0.44 - 1.00 mg/dL   Calcium 9.6 8.9 - 89.6 mg/dL   Total Protein 7.0 6.5 - 8.1 g/dL   Albumin 4.4 3.5 - 5.0 g/dL   AST 20 15 - 41 U/L   ALT 15 0 - 44 U/L   Alkaline Phosphatase 47 38 - 126 U/L   Total Bilirubin 0.5 0.0 - 1.2 mg/dL   GFR, Estimated >39 >39 mL/min    Comment: (NOTE) Calculated using the CKD-EPI Creatinine Equation (2021)    Anion gap 10 5 - 15    Comment: Performed at [facility], [address] [place], [place] [address]  CBC with Differential     Status: Abnormal   Collection Time: 05/01/24  6:22 AM  Result Value Ref Range   WBC 6.8 4.0 - 10.5 K/uL   RBC 4.98 3.87 - 5.11 MIL/uL   Hemoglobin  14.1 12.0 - 15.0 g/dL   HCT 55.7 63.9 - 53.9 %   MCV 88.8 80.0 - 100.0 fL   MCH 28.3 26.0 - 34.0 pg   MCHC 31.9 30.0 - 36.0 g/dL   RDW 84.0 (H) 88.4 - 84.4 %   Platelets 205 150 - 400 K/uL   nRBC 0.0 0.0 - 0.2 %   Neutrophils Relative % 35 %   Neutro Abs 2.4 1.7 - 7.7 K/uL   Lymphocytes Relative 53 %   Lymphs Abs 3.6 0.7 - 4.0 K/uL   Monocytes Relative 8 %   Monocytes Absolute 0.5 0.1 - 1.0 K/uL   Eosinophils Relative 3 %   Eosinophils Absolute 0.2 0.0 - 0.5 K/uL   Basophils Relative 1 %   Basophils Absolute 0.1 0.0 - 0.1 K/uL   Immature Granulocytes 0 %   Abs Immature Granulocytes 0.01 0.00 - 0.07 K/uL    Comment: Performed at [facility], [address],  [place], [place] [address]  Valproic  acid level     Status: None   Collection Time: 05/01/24  6:22 AM  Result Value Ref Range   Valproic  Acid Lvl 79 50 - 100 ug/mL    Comment: Performed at [facility], [address] [place], [place] [address]       Blood Alcohol level:  Lab Results  Component Value Date   [facility] <15 02/17/2024   ETH <15 01/14/2024    Metabolic Disorder Labs: Lab Results  Component Value Date   HGBA1C 5.1 01/18/2024   MPG 99.67 01/18/2024   MPG 105.41 05/11/2019   Lab Results  Component Value Date   PROLACTIN 24.1 (H) 05/11/2019   Lab Results  Component Value Date   CHOL 154 01/18/2024   TRIG 180 (H) 01/18/2024   HDL 49 01/18/2024   CHOLHDL 3.1 01/18/2024   VLDL 36 01/18/2024   LDLCALC 69 01/18/2024   LDLCALC 82 05/11/2019    Physical Findings: AIMS:  ,  ,  ,  ,  ,  ,   CIWA:    COWS:     Musculoskeletal: Strength & Muscle Tone: within normal limits Gait & Station: normal Patient leans: N/A  Psychiatric Specialty Exam:  Presentation  General Appearance:  Casual  Eye Contact: Fair  Speech: Clear and Coherent; Normal Rate  Speech Volume: Normal  Handedness:No data recorded  Mood and Affect  Mood: Euthymic  Affect: Other (comment) (smiling)   Thought Process  Thought Processes: Linear  Descriptions of Associations:Loose  Orientation:Partial  Thought Content:Scattered  History of Schizophrenia/Schizoaffective disorder:No  Duration of Psychotic Symptoms:Greater than six months  Hallucinations:Hallucinations: None   Ideas of Reference:None  Suicidal Thoughts:Suicidal Thoughts: No   Homicidal Thoughts:Homicidal Thoughts: No    Sensorium  Memory: Immediate Poor  Judgment: Impaired  Insight: Lacking   Executive Functions  Concentration: Fair  Attention Span: Fair  Recall: Poor  Fund of Knowledge: Poor  Language: Fair   Psychomotor Activity  Psychomotor  Activity:Psychomotor Activity: Normal    Assets  Assets: Resilience   Sleep  Sleep:Sleep: Good     Physical Exam: Physical Exam Vitals and nursing note reviewed.  Constitutional:      General: She is not in acute distress.    Appearance: Normal appearance. She is normal weight. She is not ill-appearing or toxic-appearing.  HENT:     Head: Normocephalic and atraumatic.  Pulmonary:     Effort: Pulmonary effort is normal.  Musculoskeletal:  General: Normal range of motion.  Neurological:     General: No focal deficit present.     Mental Status: She is alert.    Review of Systems  Respiratory:  Negative for cough and shortness of breath.   Cardiovascular:  Negative for chest pain.  Gastrointestinal:  Negative for abdominal pain, constipation, diarrhea, nausea and vomiting.  Neurological:  Negative for dizziness, weakness and headaches.  Psychiatric/Behavioral:  Negative for depression, hallucinations and suicidal ideas. The patient is not nervous/anxious.    Blood pressure 97/80, pulse 79, temperature 98 F (36.7 C), temperature source Oral, resp. rate 20, height 5' 6 (1.676 m), weight 80.7 kg, SpO2 100%. Body mass index is 28.71 kg/m.   Treatment Plan Summary: Daily contact with patient to assess and evaluate symptoms and progress in treatment and Medication management  Allison Bolton is a 53 yr old female who presented on 02/17/24 to [facility] ED with a chief complaint of anxiety. In the ED, she was found to be acutely psychotic with tangential speech, agitation, and delusional thought content. Patient was admitted involuntarily to [facility] on 8/14.  PPHx is significant for Substance-Induced Psychosis, Bipolar I Disorder, Stimulant Use Disorder, GAD, Suicide Attempt (2007), and Prior Psychiatric Hospitalizations (last- [facility] 01/2024).   [place]inidad remains stable from a psychiatric standpoint.  We await a group home bed for placement.  She is continuing to ask about  discharge and we still do not have an update.  We will not make any changes to her medications at this time.  We will continue to monitor.   Substance-induced psychosis  Bipolar disorder: -Continue Haldol  10 mg q12 for psychosis and mood stability -Continue Geodon  60 mg BID for psychosis and mood stability -Continue Depakote  DR 750 mg q12 for mood stability -Continue Cogentin  1 mg BID for Drug Induced EPS -Continue Agitation Protocol: Zyprexa    Dementia in other diseases classified elsewhere, unspecified severity, without behavioral disturbance, psychotic disturbance, mood disturbance, and anxiety: Daney was given a MOCA on 9/17 and scored an 8.  [facility] SW is seeking a guardian for the patient as she needs 24 hour assistance at this point and they are looking for placement at either SNF or group home.     Nicotine  Dependence: -Continue Nicotine  Gum 2 mg PRN   UTI: -UA shows E. Coli resistant to Bactrim  -Completed Cipro 10/23   -Continue Zofran  4 mg q8 PRN nausea/vomiting -Continue PRN's: Tylenol , Maalox, Atarax , Milk of Magnesia, Trazodone    --  The risks/benefits/side-effects/alternatives to medications were discussed in detail with the patient and time was given for questions. The patient consents to medication trials.                -- Metabolic profile and EKG monitoring obtained while on an atypical antipsychotic (BMI: 28.73 Lipid Panel: WNL except Trig: 180 HbgA1c: 5.1)              -- Encouraged patient to participate in unit milieu and in scheduled group therapies              -- Short Term Goals: Ability to identify changes in lifestyle to reduce recurrence of condition will improve, Ability to verbalize feelings will improve, Ability to disclose and discuss suicidal ideas, Ability to demonstrate self-control will improve, Ability to identify and develop effective coping behaviors will improve, Ability to maintain clinical measurements within normal limits will improve,  Compliance with prescribed medications will improve, and Ability to identify triggers associated with substance abuse/mental health issues will  improve             -- Long Term Goals: Improvement in symptoms so as ready for discharge   Safety and Monitoring:             -- Involuntary admission to inpatient psychiatric unit for safety, stabilization and treatment             -- Daily contact with patient to assess and evaluate symptoms and progress in treatment             -- Patient's case to be discussed in multi-disciplinary team meeting             -- Observation Level : q15 minute checks             -- Vital signs:  q12 hours             -- Precautions: suicide, elopement, and assault  Discharge Planning:              -- Social work and case management to assist with discharge planning and identification of hospital follow-up needs prior to discharge             -- Estimated LOS: 7+ more days             -- Discharge Concerns: Need to establish a safety plan; Medication compliance and effectiveness             -- Discharge Goals: Return home with outpatient referrals for mental health follow-up including medication management/psychotherapy   Marsa GORMAN Rosser, DO 05/02/2024, 1:08 PM

## 2024-05-02 NOTE — Plan of Care (Signed)
   Problem: Education: Goal: Emotional status will improve Outcome: Progressing Goal: Mental status will improve Outcome: Progressing

## 2024-05-02 NOTE — Plan of Care (Signed)

## 2024-05-02 NOTE — Progress Notes (Signed)
 Tour of Duty:  Allison JINNY Angle, RN, 05/02/24, Tour of Duty: 0700-1900  SI/HI/AVH: Denies  Self-Reported   Mood: Positive  Anxiety: Denies Depression: Denies Irritability: Denies  Broset  Violence Prevention Guidelines *See Row Information*: Moderate Violence Risk interventions implemented   LBM  Last BM Date : 05/01/24   Pain: present, PRN provided (see MAR), presentation incongruent with reported pain  Patient Refusals (including Rx): No  >>Shift Summary: Patient observed to be calm and pleasant on unit. Patient able to make needs known. Patient observed to engage appropriately with staff and peers. Patient taking medications as prescribed. This shift, PRN medication requested or required. No reported or observed side effects to medication. No reported or observed agitation, aggression, or other acute emotional distress. Patient reports severe pain to ears and back, no observable signs and symptoms. No additional reported or observed physical abnormalities or concerns.  Last Vitals  Vitals Weight: 80.7 kg Temp: 98 F (36.7 C) Temp Source: Oral Pulse Rate: (!) 113 Resp: 20 BP: (!) 131/105 Patient Position: (not recorded)  Admission Type  Psych Admission Type (Psych Patients Only) Admission Status: Involuntary Date 72 hour document signed : (not recorded) Time 72 hour document signed : (not recorded) Provider Notified (First and Last Name) (see details for LINK to note): (not recorded)   Psychosocial Assessment  Psychosocial Assessment Patient Complaints: None Eye Contact: Fair Facial Expression: Animated, Fixed smile Affect: Appropriate to circumstance Speech: Tangential Interaction: Childlike Motor Activity: Slow, Shuffling Appearance/Hygiene: Disheveled Behavior Characteristics: Cooperative Mood: Pleasant   Aggressive Behavior  Targets: (not recorded)   Thought Process  Thought Process Coherency: Disorganized Content: Preoccupation Delusions: None  reported or observed Perception: Derealization Hallucination: None reported or observed Judgment: Poor Confusion: Mild  Danger to Self/Others  Danger to Self Current suicidal ideation?: Denies Description of Suicide Plan: (not recorded) Self-Injurious Behavior: (not recorded) Agreement Not to Harm Self: (not recorded) Description of Agreement: (not recorded) Danger to Others: None reported or observed

## 2024-05-02 NOTE — Progress Notes (Signed)
(  Sleep Hours) -9.75 (Any PRNs that were needed, meds refused, or side effects to meds)- none (Any disturbances and when (visitation, over night)-none (Concerns raised by the patient)- none (SI/HI/AVH)-denied

## 2024-05-02 NOTE — Group Note (Signed)
 Date:  05/02/2024 Time:  8:21 PM  Group Topic/Focus:  Wrap-Up Group:   The focus of this group is to help patients review their daily goal of treatment and discuss progress on daily workbooks.    Participation Level:  Did Not Attend  Participation Quality:  none  Affect:  n/a  Cognitive:  n/a  Insight: None  Engagement in Group:  None  Modes of Intervention:  none  Additional Comments:   Pt was in shower during group discussion  Atia Haupt A Cormick Moss 05/02/2024, 8:21 PM

## 2024-05-02 NOTE — Group Note (Signed)
 Date:  05/02/2024 Time:  1:42 PM  Group Topic/Focus:  Social Work  Pt. Attended SW Group     Allison Bolton 05/02/2024, 1:42 PM

## 2024-05-02 NOTE — Progress Notes (Signed)
 Collateral contact [facility]) [phone]  Patient continues to be on the waiting list.   Romond Pipkins, LCSWA 05/02/2024

## 2024-05-03 NOTE — Plan of Care (Signed)
  Problem: Coping: Goal: Ability to demonstrate self-control will improve Outcome: Progressing   Problem: Health Behavior/Discharge Planning: Goal: Identification of resources available to assist in meeting health care needs will improve Outcome: Progressing   Problem: Physical Regulation: Goal: Ability to maintain clinical measurements within normal limits will improve Outcome: Progressing   

## 2024-05-03 NOTE — Progress Notes (Signed)
 Collateral contact - DSS Adult Protective Services Social Worker Izetta Roses) [phone]   Social Worker informed that patient's court date for full guardianship is scheduled for 05/04/2024 at 10:30 AM.  Social Worker said that she spoke with patient's daughter, [name], and updated her on the situation.  Social Worker informed that patient's daughter was happy that patient will go to a group home.  Social Worker informed that she spoke with [employer] from [facility] who confirmed that the process may take 2 - 6 weeks, however Camisha believes that it will only take 1 - 3 weeks.  Social Worker shared that [employer] (group home) goal is to transition patient to the group home on 05/07/2024.   Remmington Teters, LCSWA 05/03/2024

## 2024-05-03 NOTE — Group Note (Signed)
 Date:  05/03/2024 Time:  10:58 AM  Group Topic/Focus:  Pet Therapy:   This group aims to reduce stress and anxiety by using trained animals as a therapeutic tool.    Participation Level:  Pt did attend group   Latanja Lehenbauer D Ajanay Farve 05/03/2024, 10:58 AM

## 2024-05-03 NOTE — Group Note (Signed)
 Recreation Therapy Group Note   Group Topic:Animal Assisted Therapy   Group Date: 05/03/2024 Start Time: 0946 End Time: 1030 Facilitators: Archibald Marchetta-McCall, LRT,CTRS Location: [address] Dayroom   Animal-Assisted Activity (AAA) Program Checklist/Progress Notes Patient Eligibility Criteria Checklist & Daily Group note for Rec Tx Intervention  AAA/T Program Assumption of Risk Form signed by Patient/ or Parent Legal Guardian Yes  Patient is free of allergies or severe asthma Yes  Patient reports no fear of animals Yes  Patient reports no history of cruelty to animals Yes  Patient understands his/her participation is voluntary Yes  Patient washes hands before animal contact Yes  Patient washes hands after animal contact Yes  Behavioral Response: Attentive   Education: Hand Washing, Appropriate Animal Interaction   Education Outcome: Acknowledges education.    Affect/Mood: Appropriate   Participation Level: Moderate   Participation Quality: Independent   Behavior: Attentive    Speech/Thought Process: Relevant   Insight: Poor   Judgement: Poor   Modes of Intervention: Teaching Laboratory Technician   Patient Response to Interventions:  Attentive   Education Outcome:  In group clarification offered    Clinical Observations/Individualized Feedback: Patient attended session and interacted appropriately with therapy dog and peers.    Plan: Continue to engage patient in RT group sessions 2-3x/week.   Tekesha Almgren-McCall, LRT,CTRS 05/03/2024 12:50 PM

## 2024-05-03 NOTE — [facility] Group Notes (Signed)
 Adult Psychoeducational Group Note  Date:  05/03/2024 Time:  5:09 PM  Group Topic/Focus: Emotional Wellness:   The focus of this group is to discuss what feelings/emotions are, and how they are experienced.  Participation Level:  Active  Participation Quality:  Appropriate  Affect:  Appropriate  Cognitive:  Appropriate  Insight: Appropriate  Engagement in Group:  Engaged  Modes of Intervention:  Discussion  Additional Comments:  Pt attended the emotional wellness group and remained appropriate and engaged throughout the duration of the group.   Allison Bolton O 05/03/2024, 5:09 PM

## 2024-05-03 NOTE — [facility] Group Notes (Signed)
 Adult Psychoeducational Group Note  Date:  05/03/2024 Time:  5:07 PM  Group Topic/Focus: Pharmacy Group  Participation Level:  Did Not Attend  Participation Quality:    Affect:    Cognitive:    Insight:   Engagement in Group:    Modes of Intervention:    Additional Comments:    Jamelle Cassondra KIDD 05/03/2024, 5:07 PM

## 2024-05-03 NOTE — Progress Notes (Signed)
   05/02/24 2149  Psychosocial Assessment  Patient Complaints Confusion  Eye Contact Fair  Facial Expression Animated  Affect Appropriate to circumstance  Speech Tangential  Interaction Childlike  Motor Activity Slow  Appearance/Hygiene Disheveled  Behavior Characteristics Cooperative  Mood Pleasant  Aggressive Behavior  Effect No apparent injury  Thought Process  Coherency Disorganized  Content Preoccupation  Delusions None reported or observed  Perception Derealization  Hallucination None reported or observed  Judgment Impaired  Confusion Moderate  Danger to Self  Current suicidal ideation? Denies

## 2024-05-03 NOTE — Progress Notes (Signed)
(  Sleep Hours) -6.5 (Any PRNs that were needed, meds refused, or side effects to meds)-  (Any disturbances and when (visitation, over night)-pt was unable to fall asleep. On call provider notified and pt received one time order of Trazodone  (Concerns raised by the patient)- none (SI/HI/AVH)-denied

## 2024-05-03 NOTE — Plan of Care (Signed)
   Problem: Education: Goal: Emotional status will improve Outcome: Progressing Goal: Mental status will improve Outcome: Progressing

## 2024-05-03 NOTE — Progress Notes (Signed)
   05/03/24 2200  Psych Admission Type (Psych Patients Only)  Admission Status Involuntary  Psychosocial Assessment  Patient Complaints Confusion  Eye Contact Avoids  Facial Expression Animated  Affect Appropriate to circumstance  Speech Tangential  Interaction Childlike  Motor Activity Slow  Appearance/Hygiene Disheveled  Behavior Characteristics Cooperative  Mood Pleasant  Thought Process  Coherency Disorganized  Content Preoccupation  Delusions None reported or observed  Perception Derealization  Hallucination None reported or observed  Judgment Poor  Confusion Mild  Danger to Self  Current suicidal ideation? Denies  Agreement Not to Harm Self Yes  Description of Agreement Verbal  Danger to Others  Danger to Others None reported or observed

## 2024-05-03 NOTE — Progress Notes (Signed)
 [facility] MD Progress Note  05/03/2024 9:52 AM Allison Bolton  MRN:  [id number]  Principal Problem: Bipolar affective disorder, most recent episode manic with psychotic symptoms (HCC)     Major Neurocognitive Disorder due to multiple etiologies  Diagnosis: Principal Problem:   Bipolar affective disorder, current episode manic with psychotic symptoms (HCC)  Major Neurocognitive Disorder due to multiple etiologies    Total Time spent with patient:  I personally spent 25 minutes on the unit in direct patient care. The direct patient care time included face-to-face time with the patient, reviewing the patient's chart, communicating with other professionals, and coordinating care.   Identifying Information and brief psychiatric history:  Allison Bolton is a 53 yr old female with working psychiatric diagnoses of bipolar 1 disorder and major neurocognitive disorder. 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 She does have a history of prior psychiatric hospitalizations with substance use driving psychotic symptoms, however on this admission has presented with clear manic and psychotic symptoms that cleared with time and medications, however underlying neurocognitive deficits have remained. She has one prior suicide Attempt (2007), and Prior Psychiatric Hospitalizations (last- [facility] 01/2024).  On this admission she presented to [facility] ED on 02/17/24  with a chief complaint of anxiety. In the ED, she was found to be acutely psychotic with tangential speech, agitation, and delusional thought content. Patient was admitted involuntarily to [facility] on 8/14. Since admission the patient has been notably unresponsive to antipsychotic medications, largely related to poor self-care appearing disheveled and requiring direction to do ADLs. Although initially this was concerning for lingering psychotic symptoms, several OT evaluations have noted significant cognitive deficits  and poor self care is now thought to be related to major neurocognitive disorder.   Interval events:  Patient continues to be on wait list for group home. Slept 6.5 hrs overnight. No behavioral concerns, medication compliant. Vitals: BP (!) 131/105   Pulse (!) 113   Temp 98 F (36.7 C) (Oral)   Resp 20   Ht 5' 6 (1.676 m)   Wt 80.7 kg   SpO2 100%   BMI 28.71 kg/m    Interview today:  Today the patient is seen resting in bed. Briefly rouses to state she is good. Denies SI, HI and AVH. Does not voice additional concerns    Past Medical History:  Past Medical History:  Diagnosis Date   Anxiety    Arthritis    Asthma    Bipolar 1 disorder (HCC)    Diverticulosis    Dysrhythmia    sts I have heart palpitations   Fibromyalgia    H/O hiatal hernia    4   Headache(784.0)    High blood pressure    Meniere's disease    S/P colonoscopy    Dr. Golda 2010: few small diverticula at sigmoid. otherwise normal.    Shortness of breath     Past Surgical History:  Procedure Laterality Date   ABDOMINAL HYSTERECTOMY     APPENDECTOMY     CESAREAN SECTION     CHOLECYSTECTOMY     COLONOSCOPY N/A 12/25/2023   Procedure: COLONOSCOPY;  Surgeon: Eartha Angelia Sieving, MD;  Location: [facility];  Service: Gastroenterology;  Laterality: N/A;  11:30am, asa 1   exploratory laparoscopy     FOOT SURGERY     HEMORRHOID SURGERY     LAPAROSCOPIC APPENDECTOMY  02/19/2011   Procedure: APPENDECTOMY LAPAROSCOPIC;  Surgeon: Oneil DELENA Budge;  Location: [facility] ORS;  Service:  General;  Laterality: N/A;   LAPAROSCOPY  02/19/2011   Procedure: LAPAROSCOPY DIAGNOSTIC;  Surgeon: Oneil DELENA Budge;  Location: [facility] ORS;  Service: General;  Laterality: N/A;   left ovarian removal     multiple hernia repairs     Right ovarian removal     TONSILLECTOMY     TONSILLECTOMY AND ADENOIDECTOMY     tubes in ears     UMBILICAL HERNIA REPAIR  Dec 2011   Dr. Budge   wisdom teeth removal     Family History:  Family  History  Problem Relation Age of Onset   Thyroid  disease Mother    Colon cancer Father    Breast cancer Maternal Aunt    Colon cancer Brother    Colon cancer Other        paternal and maternal grandfather   Meniere's disease Other    Anesthesia problems Neg Hx    Hypotension Neg Hx    Malignant hyperthermia Neg Hx    Pseudochol deficiency Neg Hx    Family Psychiatric  History:  None Reported   Social History:  Social History   Substance and Sexual Activity  Alcohol Use No   Comment: not since June 2012     Social History   Substance and Sexual Activity  Drug Use Yes   Types: Marijuana   Comment: patient denies any-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    Social History   Socioeconomic History   Marital status: Legally Separated    Spouse name: Not on file   Number of children: Not on file   Years of education: Not on file   Highest education level: Not on file  Occupational History   Not on file  Tobacco Use   Smoking status: Every Day    Current packs/day: 0.00    Average packs/day: 0.5 packs/day for 37.3 years (18.7 ttl pk-yrs)    Types: Cigarettes    Start date: 07/07/1981    Last attempt to quit: 11/05/2018    Years since quitting: 5.4   Smokeless tobacco: Never  Vaping Use   Vaping status: Never Used  Substance and Sexual Activity   Alcohol use: No    Comment: not since June 2012   Drug use: Yes    Types: Marijuana    Comment: patient denies any-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   Sexual activity: Never    Birth control/protection: Surgical  Other Topics Concern   Not on file  Social History Narrative   Not on file   Social Drivers of Health   Financial Resource Strain: Not on file  Food Insecurity: Food Insecurity Present (02/18/2024)   Hunger Vital Sign    Worried About Running Out of Food in the Last Year: Sometimes true    Ran Out of Food in the Last Year: Sometimes true  Transportation Needs: Unmet Transportation Needs  (02/18/2024)   PRAPARE - Administrator, Civil Service (Medical): Yes    Lack of Transportation (Non-Medical): Yes  Physical Activity: Not on file  Stress: Not on file  Social Connections: Unknown (10/14/2021)   Received from [facility]   Social Connections    Do your friends and family support you?: Not on file    What agencies support you?: Not on file    Current Medications: Current Facility-Administered Medications  Medication Dose Route Frequency Provider Last Rate Last Admin   acetaminophen  (TYLENOL ) tablet 650 mg  650 mg  Oral Q6H PRN Teresa Jes L, NP   650 mg at 05/02/24 1610   alum & mag hydroxide-simeth (MAALOX/MYLANTA) 200-200-20 MG/5ML suspension 30 mL  30 mL Oral Q4H PRN White, Patrice L, NP   30 mL at 04/25/24 2207   benztropine  (COGENTIN ) tablet 1 mg  1 mg Oral BID White, Patrice L, NP   1 mg at 05/03/24 0945   divalproex  (DEPAKOTE ) DR tablet 750 mg  750 mg Oral Q12H Ji, Andrew, MD   750 mg at 05/03/24 0945   haloperidol  (HALDOL ) tablet 10 mg  10 mg Oral Q12H Ji, Andrew, MD   10 mg at 05/03/24 0945   hydrOXYzine  (ATARAX ) tablet 25 mg  25 mg Oral TID PRN White, Patrice L, NP   25 mg at 05/02/24 2236   magnesium  hydroxide (MILK OF MAGNESIA) suspension 30 mL  30 mL Oral Daily PRN White, Patrice L, NP       nicotine  polacrilex (NICORETTE ) gum 2 mg  2 mg Oral PRN Trudy Carwin, NP   2 mg at 04/23/24 1827   OLANZapine  (ZYPREXA ) injection 10 mg  10 mg Intramuscular TID PRN White, Patrice L, NP       OLANZapine  (ZYPREXA ) injection 5 mg  5 mg Intramuscular TID PRN White, Patrice L, NP       OLANZapine  zydis (ZYPREXA ) disintegrating tablet 5 mg  5 mg Oral TID PRN White, Patrice L, NP   5 mg at 04/18/24 0103   ondansetron  (ZOFRAN -ODT) disintegrating tablet 4 mg  4 mg Oral Q8H PRN Lynnette Barter, MD   4 mg at 05/02/24 2344   traZODone  (DESYREL ) tablet 50 mg  50 mg Oral QHS White, Patrice L, NP   50 mg at 05/02/24 2056   ziprasidone  (GEODON ) capsule 60 mg  60  mg Oral BID WC Marry Clamp, MD   60 mg at 05/03/24 0945    Lab Results:  No results found for this or any previous visit (from the past 48 hours).   Blood Alcohol level:  Lab Results  Component Value Date   [facility] <15 02/17/2024   ETH <15 01/14/2024    Metabolic Disorder Labs: Lab Results  Component Value Date   HGBA1C 5.1 01/18/2024   MPG 99.67 01/18/2024   MPG 105.41 05/11/2019   Lab Results  Component Value Date   PROLACTIN 24.1 (H) 05/11/2019   Lab Results  Component Value Date   CHOL 154 01/18/2024   TRIG 180 (H) 01/18/2024   HDL 49 01/18/2024   CHOLHDL 3.1 01/18/2024   VLDL 36 01/18/2024   LDLCALC 69 01/18/2024   LDLCALC 82 05/11/2019    Mental Status exam: Appearance: white female of slightly elevated BMI, fair hygiene, seen resting in bed under covers  Eye contact: good - brief Attitude towards examiner  cooperative, pleasant Psychomotor: no agitation or retardation Speech: reduced amount, infrequent one-word responses  Language: simplistic  Mood: good Affect: congruent, euthymic    Thought content: denying SI, HI, not overtly expressing delusional content Thought Process: remains confused and concrete, childlike  Perception: denying AVH, not overtly RTIS Insight: poor  Judgement: poor   Orientation: to self and hospital only - not able to accurately report date, situation  Attention/Concentration: limited due to cognitive deficits   Memory/Cognition: poor - patient's most recent MOCA on 9/17 was an 8 with significant impairments in attention and memory.  Fund of Knowledge: below average     Musculoskeletal: Strength & Muscle Tone: within normal limits Gait & Station: normal Patient  leans: N/A    Physical Exam Constitutional:      Appearance: the patient is not toxic-appearing.  Pulmonary:     Effort: Pulmonary effort is normal.  Neurological:     General: No focal deficit present.     Oriented to self and hospital only, does not  verbalize date or situation HEENT:     Eyes EOMI   Review of Systems  Respiratory:  Negative for shortness of breath.   Cardiovascular:  Negative for chest pain.  Gastrointestinal:  Negative for abdominal pain, constipation, diarrhea, nausea and vomiting.  Neurological:  Negative for headaches.   Blood pressure (!) 131/105, pulse (!) 113, temperature 98 F (36.7 C), temperature source Oral, resp. rate 20, height 5' 6 (1.676 m), weight 80.7 kg, SpO2 100%. Body mass index is 28.71 kg/m.   Treatment Plan Summary: Daily contact with patient to assess and evaluate symptoms and progress in treatment and Medication management  Assessment Allison Bolton is a 53 yr old female who presented on 02/17/24 to [facility] ED with a chief complaint of anxiety. In the ED, she was found to be acutely psychotic with tangential speech, agitation, and delusional thought content. Patient was admitted involuntarily to [facility] on 8/14.  Although substance-induced psychosis has previously been documented, she additional presents with prior reported manic episodes and a bipolar 1 diagnosis has been most reflective of this. On this admission, presented with agitation and increased rate of speech, initially had significantly poor sleep and psychotic symptoms, disorganization of thoughts, behavior, concern for AVH.  After approximately 1 week manic symptoms had largely resolved with antipsychotic medications and mood stabilizers however she remained with disorganization in thoughts and behavior and poor self care initially concerning for ongoing psychosis but after extended observation appears more in line with underlying major neurocognitive disorder due to multiple etiologies. She has significant cognitive impairments with most recent MOCA scoring at 8 and recommendation for 24 hr supervision and help/prompting ADLs. She has been noted to be childlike in her interactions and requires direction to eat and shower but is  otherwise pleasant and cooperative with care with no overt psychotic symptoms. Suspect multifactorial causes of cognitive impairment including longstanding substance use, recurrent manic/psychotic episodes, possible underlying low functioning (unknown).   Over the last 3-4 weeks the patient has been pleasant, denying AVH, somewhat more engaged in exam and with better eye contact. She has not been seen to be RTIS or overtly expressing delusional content. She remains psychiatrically stable. Presentation remains reflective of treated bipolar disorder and ongoing neurocognitive impairments. Patient has been accepted for placement pending bed placement, anticipate discharge approximately 10/20    DSM-5 diagnoses: Bipolar 1 Disorder, most recent episode manic, severe, with psychotic features    -currently resolved: no active symptoms of mania or psychosis, just cognitive deficits 2.  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 History of substance induced psychotic disorder   Plan: Legal status: Invol/voluntary by guardian  Psychiatric medications:  -Continue Haldol  10 mg q12 for psychosis and mood stability -Continue Geodon  60 mg bid -Continue Depakote  DR 750 mg q12 for mood stability -Continue Cogentin  1 mg BID for Drug Induced EPS -Continue Agitation Protocol: Zyprexa    Nicotine  Dependence: -Continue Nicotine  Gum 2 mg PRN  Medical concerns: -UTI with U/A noted resistance to bactrim  -Continue Cipro 500 mg BID x 10 days   -initiated 10/13  Additional PRNs -Continue Zofran  4 mg q8 PRN nausea/vomiting -Continue PRN's: Tylenol , Maalox, Atarax , Milk of Magnesia, Trazodone    --  The risks/benefits/side-effects/alternatives to medications were discussed in detail with the patient and time was given for questions. The patient consents to medication trials.                -- Metabolic profile and EKG monitoring obtained while on an atypical antipsychotic (BMI: 28.73 Lipid  Panel: WNL except Trig: 180 HbgA1c: 5.1)              -- Encouraged patient to participate in unit milieu and in scheduled group therapies              -- Short Term Goals: Ability to identify changes in lifestyle to reduce recurrence of condition will improve, Ability to verbalize feelings will improve, Ability to disclose and discuss suicidal ideas, Ability to demonstrate self-control will improve, Ability to identify and develop effective coping behaviors will improve, Ability to maintain clinical measurements within normal limits will improve, Compliance with prescribed medications will improve, and Ability to identify triggers associated with substance abuse/mental health issues will improve             -- Long Term Goals: Improvement in symptoms so as ready for discharge   Safety and Monitoring:             -- Involuntary admission to inpatient psychiatric unit for safety, stabilization and treatment             -- Daily contact with patient to assess and evaluate symptoms and progress in treatment             -- Patient's case to be discussed in multi-disciplinary team meeting             -- Observation Level : q15 minute checks             -- Vital signs:  q12 hours             -- Precautions: suicide, elopement, and assault  Discharge Planning:             -appreciate SW assistance in discharging  [facility] SW now involved and will be getting the patient a guardian and assisting in finding placement. Level 2 filled out and guardianship hearing was on 10/7 - she now has guardian.   -initial placement fell through, [facility] SW assisting with referrals to alternative group homes   Leita LOISE Arts, MD 05/03/2024, 9:52 AM

## 2024-05-03 NOTE — Progress Notes (Signed)
 Tour of Duty:  Allison JINNY Angle, RN, 05/03/24, Tour of Duty: 0700-1900  SI/HI/AVH: Denies  Self-Reported   Mood: Positive  Anxiety: Denies Depression: Denies Irritability: Denies  Broset  Violence Prevention Guidelines *See Row Information*: Moderate Violence Risk interventions implemented   LBM  Last BM Date : 05/02/24   Pain: present, PRN provided (see MAR)  Patient Refusals (including Rx): No  >>Shift Summary: Patient observed to be calm on unit. Patient able to make needs known. Patient noted to pace unit. Patient observed to engage appropriately with staff and peers. Patient taking medications as prescribed. This shift, PRN medication requested or required. No reported or observed side effects to medication. No reported or observed agitation, aggression, or other acute emotional distress. No reported or observed physical abnormalities or concerns.  Last Vitals  Vitals Weight: 80.7 kg Temp: 98 F (36.7 C) Temp Source: Oral Pulse Rate: (!) 113 Resp: 20 BP: (!) 131/105 Patient Position: (not recorded)  Admission Type  Psych Admission Type (Psych Patients Only) Admission Status: Involuntary Date 72 hour document signed : (not recorded) Time 72 hour document signed : (not recorded) Provider Notified (First and Last Name) (see details for LINK to note): (not recorded)   Psychosocial Assessment  Psychosocial Assessment Patient Complaints: Confusion Eye Contact: Fair Facial Expression: Animated, Fixed smile Affect: Appropriate to circumstance Speech: Tangential Interaction: Childlike Motor Activity: Slow, Shuffling Appearance/Hygiene: Disheveled Behavior Characteristics: Cooperative Mood: Pleasant   Aggressive Behavior  Targets: (not recorded)   Thought Process  Thought Process Coherency: Disorganized Content: Preoccupation Delusions: None reported or observed Perception: Derealization Hallucination: None reported or observed Judgment:  Poor Confusion: Mild  Danger to Self/Others  Danger to Self Current suicidal ideation?: Denies Description of Suicide Plan: (not recorded) Self-Injurious Behavior: (not recorded) Agreement Not to Harm Self: (not recorded) Description of Agreement: (not recorded) Danger to Others: None reported or observed

## 2024-05-03 NOTE — [facility] Group Notes (Signed)
 Adult Psychoeducational Group Note  Date:  05/03/2024 Time:  10:46 AM  Group Topic/Focus:  Goals Group:   The focus of this group is to help patients establish daily goals to achieve during treatment and discuss how the patient can incorporate goal setting into their daily lives to aide in recovery. Orientation:   The focus of this group is to educate the patient on the purpose and policies of crisis stabilization and provide a format to answer questions about their admission.  The group details unit policies and expectations of patients while admitted.  Participation Level:  Did Not Attend  Participation Quality:    Affect:    Cognitive:    Insight:   Engagement in Group:    Modes of Intervention:    Additional Comments:    Jamelle Cassondra KIDD 05/03/2024, 10:46 AM

## 2024-05-03 NOTE — Plan of Care (Signed)
  Problem: Education: Goal: Emotional status will improve 05/03/2024 2252 by Meri Pelot, Shanda ORN, RN Outcome: Progressing Goal: Mental status will improve 05/03/2024 2252 by Alexiya Franqui, Shanda ORN, RN Outcome: Progressing

## 2024-05-03 NOTE — Group Note (Signed)
 Recreation Therapy Group Note   Group Topic:Coping Skills  Group Date: 05/03/2024 Start Time: 1050 End Time: 1100 Facilitators: Lemonte Al-McCall, LRT,CTRS Location: [address] Dayroom   Group Topic: Coping Skills  Goal Area(s) Addresses:  Patient will identify challenges that are holding them back. Patient will identify coping skills that will help them deal with the challenges they are faced with.  Behavioral Response: Minimal  Intervention: Pencils, Worksheet  Activity: Get Unstuck. LRT and patients discussed how webs are used by spiders to trap food and other things. Patients were then given a sheet with a blank spider web. Patients were to identify the challenges they are facing and write them inside of the web. Patients were then instructed to write coping skills outside of the web that would help them get free of the things holding them back.    Education: Pharmacologist, Building Control Surveyor.   Education Outcome: Acknowledges understanding/In group clarification offered/Needs additional education.    Affect/Mood: Appropriate   Participation Level: Moderate   Participation Quality: Independent   Behavior: Cooperative   Speech/Thought Process: Irrational   Insight: Lacking   Judgement: Lacking    Modes of Intervention: Worksheet   Patient Response to Interventions:  Receptive   Education Outcome:  In group clarification offered    Clinical Observations/Individualized Feedback: Pt had a smile on her face but a lot of what she would say was irrelevant to the topic. Pt identified her challenges as wanting to go home, wants to go back to her kids, money missing from her car, wants [place] and wants to go to [employer]. Pt couldn't identify any coping skills. Pt had a tough time focusing and staying on topic. Pt also had a hard time sitting after doing the web.       Plan: Continue to engage patient in RT group sessions 2-3x/week.   Shashana Fullington-McCall, LRT,CTRS 05/03/2024 1:02 PM

## 2024-05-03 NOTE — [facility] Group Notes (Signed)
 Adult Psychoeducational Group Note  Date:  05/03/2024 Time:  1:09 PM  Group Topic/Focus: Recretion Therapy   Participation Level:  Active  Participation Quality:  Appropriate  Affect:  Appropriate  Cognitive:  Appropriate  Insight: Appropriate  Engagement in Group:  Engaged  Modes of Intervention:  Discussion  Additional Comments:  Pt attended the rec therapy group and remained appropriate and engaged throughout the duration of the group.   [name] 05/03/2024, 1:09 PM

## 2024-05-03 NOTE — Progress Notes (Signed)
 Type of Note:  IVC Court Hearing     Patient participated in the court hearing.   The IVC has been approved for an additional 21 days.     Allison Bolton 04/19/2024

## 2024-05-04 ENCOUNTER — Encounter (HOSPITAL_COMMUNITY): Payer: Self-pay
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
## 2024-05-04 NOTE — [facility] Group Notes (Signed)
 Adult Psychoeducational Group Note  Date:  05/04/2024 Time:  7:53 AM  Group Topic/Focus:  Goals Group:   The focus of this group is to help patients establish daily goals to achieve during treatment and discuss how the patient can incorporate goal setting into their daily lives to aide in recovery. Orientation:   The focus of this group is to educate the patient on the purpose and policies of crisis stabilization and provide a format to answer questions about their admission.  The group details unit policies and expectations of patients while admitted.  Participation Level:  Did Not Attend  Participation Quality:    Affect:    Cognitive:    Insight:   Engagement in Group:    Modes of Intervention:    Additional Comments:    Annett Berle Hoyer 05/04/2024, 7:53 AM

## 2024-05-04 NOTE — Progress Notes (Signed)
(  Sleep Hours) - 9  (Any PRNs that were needed, meds refused, or side effects to meds)- none  (Any disturbances and when (visitation, over night)- None  (Concerns raised by the patient)- None  (SI/HI/AVH)- Denies

## 2024-05-04 NOTE — [facility] Group Notes (Signed)
 Adult Psychoeducational Group Note  Date:  05/04/2024 Time:  12:45 PM  Group Topic/Focus:  Wellness Toolbox:   The focus of this group is to discuss various aspects of wellness, balancing those aspects and exploring ways to increase the ability to experience wellness.  Patients will create a wellness toolbox for use upon discharge.  Participation Level:  Did Not Attend   Annett Redman Ramsay 05/04/2024, 12:45 PM

## 2024-05-04 NOTE — [facility] Group Notes (Signed)
 Adult Psychoeducational Group Note  Date:  05/04/2024 Time:  1:16 PM  Group Topic/Focus:  Physical Wellness  Participation Level:  Did Not Attend  Participation Quality:    Affect:    Cognitive:    Insight:   Engagement in Group:    Modes of Intervention:    Additional Comments:    Allison Bolton 05/04/2024, 1:16 PM

## 2024-05-04 NOTE — Group Note (Signed)
 Date:  05/04/2024 Time:  8:45 PM  Group Topic/Focus:  Wrap-Up Group:   The focus of this group is to help patients review their daily goal of treatment and discuss progress on daily workbooks.    Participation Level:  Did Not Attend   Allison Bolton 05/04/2024, 8:45 PM

## 2024-05-04 NOTE — Group Note (Signed)
 Recreation Therapy Group Note   Group Topic:Relaxation  Group Date: 05/04/2024 Start Time: 1020 End Time: 1035 Facilitators: Cian Costanzo-McCall, LRT,CTRS Location: [address] Dayroom     Group Topic/Focus: Music Therapy  Goal Area(s) Addresses:  Patient will express the importance of music in self care.  Patient will demonstrate appropriate behavior while listening to the music.  Behavioral Response:   Activity: Patients were given the opportunity to request the songs of their choosing as long as they were clean and appropriate. Patients were to also share what that song means to them.  Intervention: Music   Education: Leisure exposure, Coping skills, Musical expression, Discharge Planning  Affect/Mood: N/A   Participation Level: Did not attend    Clinical Observations/Individualized Feedback:      Plan: Continue to engage patient in RT group sessions 2-3x/week.   Tomia Enlow-McCall, LRT,CTRS 05/04/2024 12:04 PM

## 2024-05-04 NOTE — [facility] Group Notes (Signed)
 Adult Psychoeducational Group Note  Date:  05/04/2024 Time:  1:17 PM  Group Topic/Focus: Emotional Wellness  Participation Level:  Did Not Attend  Participation Quality:    Affect:    Cognitive:    Insight:   Engagement in Group:    Modes of Intervention:    Additional Comments:    Jamelle Cassondra KIDD 05/04/2024, 1:17 PM

## 2024-05-04 NOTE — Progress Notes (Signed)
 [facility] MD Progress Note  05/04/2024 9:28 AM Allison Bolton  MRN:  [id number]  Principal Problem: Bipolar affective disorder, most recent episode manic with psychotic symptoms (HCC)     Major Neurocognitive Disorder due to multiple etiologies  Diagnosis: Principal Problem:   Bipolar affective disorder, current episode manic with psychotic symptoms (HCC)  Major Neurocognitive Disorder due to multiple etiologies    Total Time spent with patient:  I personally spent 25 minutes on the unit in direct patient care. The direct patient care time included face-to-face time with the patient, reviewing the patient's chart, communicating with other professionals, and coordinating care.   Identifying Information and brief psychiatric history:  Allison Bolton is a 53 yr old female with working psychiatric diagnoses of bipolar 1 disorder and major neurocognitive disorder. 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 She does have a history of prior psychiatric hospitalizations with substance use driving psychotic symptoms, however on this admission has presented with clear manic and psychotic symptoms that cleared with time and medications, however underlying neurocognitive deficits have remained. She has one prior suicide Attempt (2007), and Prior Psychiatric Hospitalizations (last- [facility] 01/2024).  On this admission she presented to [facility] ED on 02/17/24  with a chief complaint of anxiety. In the ED, she was found to be acutely psychotic with tangential speech, agitation, and delusional thought content. Patient was admitted involuntarily to [facility] on 8/14. Since admission the patient has been notably unresponsive to antipsychotic medications, largely related to poor self-care appearing disheveled and requiring direction to do ADLs. Although initially this was concerning for lingering psychotic symptoms, several OT evaluations have noted significant cognitive deficits  and poor self care is now thought to be related to major neurocognitive disorder.   Interval events:  Patient continues to be on wait list for group home with assistance for placement with assistance from DSS - goal is to transition to group home on 11/1. Per staff patient has been pleasant, attending some groups with minimal ability to engage, medication compliant and no behavioral concerns. Vitals: BP 94/73 (BP Location: Right Arm)   Pulse 84   Temp (!) 97.5 F (36.4 C) (Oral)   Resp 20   Ht 5' 6 (1.676 m)   Wt 80.7 kg   SpO2 98%   BMI 28.71 kg/m    Interview today:  Today the patient is seen walking through the hallway. She smiles and states her mood is good. Reports she is drinking gatorade and this is also good. Denies any concerns today. No SI, HI or AVH reported.    Past Medical History:  Past Medical History:  Diagnosis Date   Anxiety    Arthritis    Asthma    Bipolar 1 disorder (HCC)    Diverticulosis    Dysrhythmia    sts I have heart palpitations   Fibromyalgia    H/O hiatal hernia    4   Headache(784.0)    High blood pressure    Meniere's disease    S/P colonoscopy    Dr. Golda 2010: few small diverticula at sigmoid. otherwise normal.    Shortness of breath     Past Surgical History:  Procedure Laterality Date   ABDOMINAL HYSTERECTOMY     APPENDECTOMY     CESAREAN SECTION     CHOLECYSTECTOMY     COLONOSCOPY N/A 12/25/2023   Procedure: COLONOSCOPY;  Surgeon: Eartha Angelia Sieving, MD;  Location: [facility];  Service: Gastroenterology;  Laterality: N/A;  11:30am, asa 1   exploratory laparoscopy     FOOT SURGERY     HEMORRHOID SURGERY     LAPAROSCOPIC APPENDECTOMY  02/19/2011   Procedure: APPENDECTOMY LAPAROSCOPIC;  Surgeon: Oneil DELENA Budge;  Location: [facility] ORS;  Service: General;  Laterality: N/A;   LAPAROSCOPY  02/19/2011   Procedure: LAPAROSCOPY DIAGNOSTIC;  Surgeon: Oneil DELENA Budge;  Location: [facility] ORS;  Service: General;  Laterality: N/A;    left ovarian removal     multiple hernia repairs     Right ovarian removal     TONSILLECTOMY     TONSILLECTOMY AND ADENOIDECTOMY     tubes in ears     UMBILICAL HERNIA REPAIR  Dec 2011   Dr. Budge   wisdom teeth removal     Family History:  Family History  Problem Relation Age of Onset   Thyroid  disease Mother    Colon cancer Father    Breast cancer Maternal Aunt    Colon cancer Brother    Colon cancer Other        paternal and maternal grandfather   Meniere's disease Other    Anesthesia problems Neg Hx    Hypotension Neg Hx    Malignant hyperthermia Neg Hx    Pseudochol deficiency Neg Hx    Family Psychiatric  History:  None Reported   Social History:  Social History   Substance and Sexual Activity  Alcohol Use No   Comment: not since June 2012     Social History   Substance and Sexual Activity  Drug Use Yes   Types: Marijuana   Comment: patient denies any-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    Social History   Socioeconomic History   Marital status: Legally Separated    Spouse name: Not on file   Number of children: Not on file   Years of education: Not on file   Highest education level: Not on file  Occupational History   Not on file  Tobacco Use   Smoking status: Every Day    Current packs/day: 0.00    Average packs/day: 0.5 packs/day for 37.3 years (18.7 ttl pk-yrs)    Types: Cigarettes    Start date: 07/07/1981    Last attempt to quit: 11/05/2018    Years since quitting: 5.4   Smokeless tobacco: Never  Vaping Use   Vaping status: Never Used  Substance and Sexual Activity   Alcohol use: No    Comment: not since June 2012   Drug use: Yes    Types: Marijuana    Comment: patient denies any-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   Sexual activity: Never    Birth control/protection: Surgical  Other Topics Concern   Not on file  Social History Narrative   Not on file   Social Drivers of Health   Financial Resource Strain: Not on file   Food Insecurity: Food Insecurity Present (02/18/2024)   Hunger Vital Sign    Worried About Running Out of Food in the Last Year: Sometimes true    Ran Out of Food in the Last Year: Sometimes true  Transportation Needs: Unmet Transportation Needs (02/18/2024)   PRAPARE - Administrator, Civil Service (Medical): Yes    Lack of Transportation (Non-Medical): Yes  Physical Activity: Not on file  Stress: Not on file  Social Connections: Unknown (10/14/2021)   [facility] from [facility]   Social Connections    Do your friends and family  support you?: Not on file    What agencies support you?: Not on file    Current Medications: Current Facility-Administered Medications  Medication Dose Route Frequency Provider Last Rate Last Admin   acetaminophen  (TYLENOL ) tablet 650 mg  650 mg Oral Q6H PRN White, Patrice L, NP   650 mg at 05/03/24 1825   alum & mag hydroxide-simeth (MAALOX/MYLANTA) 200-200-20 MG/5ML suspension 30 mL  30 mL Oral Q4H PRN White, Patrice L, NP   30 mL at 04/25/24 2207   benztropine  (COGENTIN ) tablet 1 mg  1 mg Oral BID White, Patrice L, NP   1 mg at 05/04/24 0820   divalproex  (DEPAKOTE ) DR tablet 750 mg  750 mg Oral Q12H Ji, Andrew, MD   750 mg at 05/04/24 0820   haloperidol  (HALDOL ) tablet 10 mg  10 mg Oral Q12H Ji, Andrew, MD   10 mg at 05/04/24 0820   hydrOXYzine  (ATARAX ) tablet 25 mg  25 mg Oral TID PRN White, Patrice L, NP   25 mg at 05/02/24 2236   magnesium  hydroxide (MILK OF MAGNESIA) suspension 30 mL  30 mL Oral Daily PRN White, Patrice L, NP       nicotine  polacrilex (NICORETTE ) gum 2 mg  2 mg Oral PRN Trudy Carwin, NP   2 mg at 04/23/24 1827   OLANZapine  (ZYPREXA ) injection 10 mg  10 mg Intramuscular TID PRN White, Patrice L, NP       OLANZapine  (ZYPREXA ) injection 5 mg  5 mg Intramuscular TID PRN White, Patrice L, NP       OLANZapine  zydis (ZYPREXA ) disintegrating tablet 5 mg  5 mg Oral TID PRN White, Patrice L, NP   5 mg at 04/18/24 0103    ondansetron  (ZOFRAN -ODT) disintegrating tablet 4 mg  4 mg Oral Q8H PRN Lynnette Barter, MD   4 mg at 05/02/24 2344   traZODone  (DESYREL ) tablet 50 mg  50 mg Oral QHS White, Patrice L, NP   50 mg at 05/03/24 2055   ziprasidone  (GEODON ) capsule 60 mg  60 mg Oral BID WC Marry Clamp, MD   60 mg at 05/04/24 0820    Lab Results:  No results found for this or any previous visit (from the past 48 hours).   Blood Alcohol level:  Lab Results  Component Value Date   [facility] <15 02/17/2024   ETH <15 01/14/2024    Metabolic Disorder Labs: Lab Results  Component Value Date   HGBA1C 5.1 01/18/2024   MPG 99.67 01/18/2024   MPG 105.41 05/11/2019   Lab Results  Component Value Date   PROLACTIN 24.1 (H) 05/11/2019   Lab Results  Component Value Date   CHOL 154 01/18/2024   TRIG 180 (H) 01/18/2024   HDL 49 01/18/2024   CHOLHDL 3.1 01/18/2024   VLDL 36 01/18/2024   LDLCALC 69 01/18/2024   LDLCALC 82 05/11/2019    Mental Status exam: Appearance: white female of slightly elevated BMI, fair hygiene, seen walking through milieu, smiling and drinking gatorade, dressed in pink pants and casual shirt  Eye contact: good  Attitude towards examiner  cooperative, pleasant Psychomotor: no agitation or retardation Speech: reduced amount, infrequent one-word responses  Language: simplistic  Mood: good Affect: congruent, euthymic    Thought content: denying SI, HI, not overtly expressing delusional content Thought Process: remains confused and concrete, childlike  Perception: denying AVH, not overtly RTIS Insight: poor  Judgement: poor   Orientation: to self and hospital only - not able to accurately report date, situation  Attention/Concentration: limited due to cognitive deficits   Memory/Cognition: poor - patient's most recent MOCA on 9/17 was an 8 with significant impairments in attention and memory.  Fund of Knowledge: below average     Musculoskeletal: Strength & Muscle Tone: within normal  limits Gait & Station: normal Patient leans: N/A    Physical Exam Constitutional:      Appearance: the patient is not toxic-appearing.  Pulmonary:     Effort: Pulmonary effort is normal.  Neurological:     General: No focal deficit present.     Oriented to self and hospital only, does not verbalize date or situation HEENT:     Eyes EOMI   Review of Systems  Respiratory:  Negative for shortness of breath.   Cardiovascular:  Negative for chest pain.  Gastrointestinal:  Negative for abdominal pain, constipation, diarrhea, nausea and vomiting.  Neurological:  Negative for headaches.   Blood pressure 94/73, pulse 84, temperature (!) 97.5 F (36.4 C), temperature source Oral, resp. rate 20, height 5' 6 (1.676 m), weight 80.7 kg, SpO2 98%. Body mass index is 28.71 kg/m.   Treatment Plan Summary: Daily contact with patient to assess and evaluate symptoms and progress in treatment and Medication management  Assessment MACKENZE GRANDISON is a 53 yr old female who presented on 02/17/24 to [facility] ED with a chief complaint of anxiety. In the ED, she was found to be acutely psychotic with tangential speech, agitation, and delusional thought content. Patient was admitted involuntarily to [facility] on 8/14.  Although substance-induced psychosis has previously been documented, she additional presents with prior reported manic episodes and a bipolar 1 diagnosis has been most reflective of this. On this admission, presented with agitation and increased rate of speech, initially had significantly poor sleep and psychotic symptoms, disorganization of thoughts, behavior, concern for AVH.  After approximately 1 week manic symptoms had largely resolved with antipsychotic medications and mood stabilizers however she remained with disorganization in thoughts and behavior and poor self care initially concerning for ongoing psychosis but after extended observation appears more in line with underlying major  neurocognitive disorder due to multiple etiologies. She has significant cognitive impairments with most recent MOCA scoring at 8 and recommendation for 24 hr supervision and help/prompting ADLs. She has been noted to be childlike in her interactions and requires direction to eat and shower but is otherwise pleasant and cooperative with care with no overt psychotic symptoms. Suspect multifactorial causes of cognitive impairment including longstanding substance use, recurrent manic/psychotic episodes, possible underlying low functioning (unknown).   Over the last 3-4 weeks the patient has been pleasant, denying AVH, somewhat more engaged in exam and with better eye contact. She has not been seen to be RTIS or overtly expressing delusional content. She remains psychiatrically stable. Presentation remains reflective of treated bipolar disorder and ongoing neurocognitive impairments. Patient has been accepted for placement pending bed placement, anticipate discharge approximately 10/20    DSM-5 diagnoses: Bipolar 1 Disorder, most recent episode manic, severe, with psychotic features    -currently resolved: no active symptoms of mania or psychosis, just cognitive deficits 2.  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 History of substance induced psychotic disorder   Plan: Legal status: Invol/voluntary by guardian  Psychiatric medications:  -Continue Haldol  10 mg q12 for psychosis and mood stability -Continue Geodon  60 mg bid -Continue Depakote  DR 750 mg q12 for mood stability -Continue Cogentin  1 mg BID for Drug Induced EPS -Continue Agitation Protocol: Zyprexa    Nicotine  Dependence: -  Continue Nicotine  Gum 2 mg PRN  Medical concerns: -UTI with U/A noted resistance to bactrim  -Continue Cipro 500 mg BID x 10 days   -initiated 10/13  Additional PRNs -Continue Zofran  4 mg q8 PRN nausea/vomiting -Continue PRN's: Tylenol , Maalox, Atarax , Milk of Magnesia, Trazodone    --   The risks/benefits/side-effects/alternatives to medications were discussed in detail with the patient and time was given for questions. The patient consents to medication trials.                -- Metabolic profile and EKG monitoring obtained while on an atypical antipsychotic (BMI: 28.73 Lipid Panel: WNL except Trig: 180 HbgA1c: 5.1)              -- Encouraged patient to participate in unit milieu and in scheduled group therapies              -- Short Term Goals: Ability to identify changes in lifestyle to reduce recurrence of condition will improve, Ability to verbalize feelings will improve, Ability to disclose and discuss suicidal ideas, Ability to demonstrate self-control will improve, Ability to identify and develop effective coping behaviors will improve, Ability to maintain clinical measurements within normal limits will improve, Compliance with prescribed medications will improve, and Ability to identify triggers associated with substance abuse/mental health issues will improve             -- Long Term Goals: Improvement in symptoms so as ready for discharge   Safety and Monitoring:             -- Involuntary admission to inpatient psychiatric unit for safety, stabilization and treatment             -- Daily contact with patient to assess and evaluate symptoms and progress in treatment             -- Patient's case to be discussed in multi-disciplinary team meeting             -- Observation Level : q15 minute checks             -- Vital signs:  q12 hours             -- Precautions: suicide, elopement, and assault  Discharge Planning:             -appreciate SW assistance in discharging  [facility] SW now involved and will be getting the patient a guardian and assisting in finding placement. Level 2 filled out and guardianship hearing was on 10/7 - she now has guardian.   -initial placement fell through, [facility] SW assisting with referrals to alternative group homes   Leita LOISE Arts,  MD 05/04/2024, 9:28 AM

## 2024-05-04 NOTE — Group Note (Signed)
 Date:  05/04/2024 Time:  3:35 PM  Group Topic/Focus: Social Wellness Emotional Education:   The focus of this group is to discuss what feelings/emotions are, and how they are experienced. Explained how to magnae social anxiety and breathing techniques.     Participation Level:  Did Not Attend       Allison Bolton 05/04/2024, 3:35 PM

## 2024-05-04 NOTE — Plan of Care (Signed)
   Problem: Education: Goal: Emotional status will improve Outcome: Progressing

## 2024-05-04 NOTE — Progress Notes (Addendum)
 Collateral contact - DSS Adult Protective Services Social Worker Izetta Roses [phone]  Social Worker said that today's full guardianship court hearing (10:30 AM) was continued to next Wednesday, 05/11/2024 at 9:15 AM so the [facility]AL visit patient before the court hearing.  Social worker will give the [facility] CSW's phone number so he can schedule an appointment.  Social Worker gave verbal permission to speak him.   Collateral contact GLENWOOD Ned Hux GAL [phone] GAL visited patient in person in the conference room.     Chrysta Fulcher, LCSWA 05/04/2024

## 2024-05-05 ENCOUNTER — Inpatient Hospital Stay (HOSPITAL_COMMUNITY)

## 2024-05-05 ENCOUNTER — Other Ambulatory Visit: Payer: Self-pay

## 2024-05-05 NOTE — Group Note (Signed)
 Date:  05/05/2024 Time:  8:20 PM  Group Topic/Focus:  Wrap-Up Group:   The focus of this group is to help patients review their daily goal of treatment and discuss progress on daily workbooks.    Participation Level:  Did Not Attend   Allison Bolton 05/05/2024, 8:20 PM

## 2024-05-05 NOTE — Progress Notes (Signed)
 Tonight, on call provider received the following report from patient's nurse :  2120 patient found on floor - unwitnessed fall. No obvious injuries, move all extremities. Does complain of back pain 10/10. Vital signs stable BP 130/71 HR 60 SPO2 97% .  Per nurses note, patient unable to recall how she ended up on the floor. Recommend transfer to [facility] for medical clearance. Patient may return to [facility] to continue her psychiatric care when medically stable.  I spoke to Dr. Zammit at [facility] provider has agreed to accept the patient. EMTALA completed.  Patient remains under IVC.

## 2024-05-05 NOTE — ED Triage Notes (Signed)
 Pt reports falling today at [facility] and injuring her right knee. No other complaints.

## 2024-05-05 NOTE — [facility] Group Notes (Signed)
 [facility] Assessment Progress Note          Pt did not attend group

## 2024-05-05 NOTE — Progress Notes (Signed)
 2120 [facility] adjacent to patient room heard a thump went to check on patient found sitting on floor at end of bed with only brief on.   Patient was questioned how she managed to get in position, patient stated I dont know   No obvious injuries noted, moves all extremities with no limitations. Does endorse back pain. Vital signs obtained: BP 130/71 HR 60 RR 17 SpO2 97% RA  Provider Chinwendu, NP notified via secure chat.   2149 Attempted to contact family members to update on situation: Ileana, daughter, and Larnell - uncle.   2230 Patient to be sent to [facility] for medical clearance. Safe transport and ED charge RN notified of plan and situation.  2240 Safe transport arrived, patient escorted to lobby with [facility] staff.   9866 Patient returned from [facility]. Unremarkable exam. Recommendation PRN analgesic for discomfort.

## 2024-05-05 NOTE — Progress Notes (Signed)
 Patient denies SI, AH, VH. Patient stated they slept Good last night. Scored zero on anxiety and depression. Patient has been calm, cooperative, and med compliant.    Mercie VEAR Banana, RN    05/05/24 0800  Psych Admission Type (Psych Patients Only)  Admission Status Voluntary  Psychosocial Assessment  Patient Complaints None  Eye Contact Fair  Facial Expression Animated  Affect Appropriate to circumstance  Speech Tangential  Interaction Childlike  Motor Activity Slow  Appearance/Hygiene Disheveled  Behavior Characteristics Cooperative;Calm  Mood Pleasant  Thought Process  Coherency Disorganized  Content Preoccupation  Delusions None reported or observed  Perception Derealization  Hallucination None reported or observed  Judgment Poor  Confusion Mild  Danger to Self  Current suicidal ideation? Denies  Agreement Not to Harm Self Yes  Description of Agreement Verbal  Danger to Others  Danger to Others None reported or observed

## 2024-05-05 NOTE — Progress Notes (Signed)
(  Sleep Hours) -8.75 (Any PRNs that were needed, meds refused, or side effects to meds)- none (Any disturbances and when (visitation, over night)- n/a (Concerns raised by the patient)- none (SI/HI/AVH)- Denies

## 2024-05-05 NOTE — Progress Notes (Signed)
 [facility] MD Progress Note  05/05/2024 12:58 PM Allison Bolton  MRN:  [id number]  Principal Problem: Bipolar affective disorder, most recent episode manic with psychotic symptoms (HCC)     Major Neurocognitive Disorder due to multiple etiologies  Diagnosis: Principal Problem:   Bipolar affective disorder, current episode manic with psychotic symptoms (HCC)  Major Neurocognitive Disorder due to multiple etiologies    Total Time spent with patient:  I personally spent 25 minutes on the unit in direct patient care. The direct patient care time included face-to-face time with the patient, reviewing the patient's chart, communicating with other professionals, and coordinating care.   Identifying Information and brief psychiatric history:  Allison Bolton is a 53 yr old female with working psychiatric diagnoses of bipolar 1 disorder and major neurocognitive disorder. 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 She does have a history of prior psychiatric hospitalizations with substance use driving psychotic symptoms, however on this admission has presented with clear manic and psychotic symptoms that cleared with time and medications, however underlying neurocognitive deficits have remained. She has one prior suicide Attempt (2007), and Prior Psychiatric Hospitalizations (last- [facility] 01/2024).  On this admission she presented to [facility] ED on 02/17/24  with a chief complaint of anxiety. In the ED, she was found to be acutely psychotic with tangential speech, agitation, and delusional thought content. Patient was admitted involuntarily to [facility] on 8/14. Since admission the patient has been notably unresponsive to antipsychotic medications, largely related to poor self-care appearing disheveled and requiring direction to do ADLs. Although initially this was concerning for lingering psychotic symptoms, several OT evaluations have noted significant cognitive deficits  and poor self care is now thought to be related to major neurocognitive disorder.   Interval events:  Patient continues to be on wait list for group home with assistance for placement with assistance from DSS - goal is to transition to group home within the next week. Per staff patient has been pleasant, attending some groups with minimal ability to engage, medication compliant and no behavioral concerns. Vitals: BP 104/66 (BP Location: Right Arm)   Pulse 77   Temp (!) 97.5 F (36.4 C) (Oral)   Resp 20   Ht 5' 6 (1.676 m)   Wt 80.7 kg   SpO2 98%   BMI 28.71 kg/m    Interview today:  Today the patient is seen resting in bed. Briefly rouses to voice she is good and does not endorse SI, HI or AVH.    Past Medical History:  Past Medical History:  Diagnosis Date   Anxiety    Arthritis    Asthma    Bipolar 1 disorder (HCC)    Diverticulosis    Dysrhythmia    sts I have heart palpitations   Fibromyalgia    H/O hiatal hernia    4   Headache(784.0)    High blood pressure    Meniere's disease    S/P colonoscopy    Dr. Golda 2010: few small diverticula at sigmoid. otherwise normal.    Shortness of breath     Past Surgical History:  Procedure Laterality Date   ABDOMINAL HYSTERECTOMY     APPENDECTOMY     CESAREAN SECTION     CHOLECYSTECTOMY     COLONOSCOPY N/A 12/25/2023   Procedure: COLONOSCOPY;  Surgeon: Eartha Angelia Sieving, MD;  Location: [facility];  Service: Gastroenterology;  Laterality: N/A;  11:30am, asa 1   exploratory laparoscopy  FOOT SURGERY     HEMORRHOID SURGERY     LAPAROSCOPIC APPENDECTOMY  02/19/2011   Procedure: APPENDECTOMY LAPAROSCOPIC;  Surgeon: Oneil DELENA Budge;  Location: [facility] ORS;  Service: General;  Laterality: N/A;   LAPAROSCOPY  02/19/2011   Procedure: LAPAROSCOPY DIAGNOSTIC;  Surgeon: Oneil DELENA Budge;  Location: [facility] ORS;  Service: General;  Laterality: N/A;   left ovarian removal     multiple hernia repairs     Right ovarian removal      TONSILLECTOMY     TONSILLECTOMY AND ADENOIDECTOMY     tubes in ears     UMBILICAL HERNIA REPAIR  Dec 2011   Dr. Budge   wisdom teeth removal     Family History:  Family History  Problem Relation Age of Onset   Thyroid  disease Mother    Colon cancer Father    Breast cancer Maternal Aunt    Colon cancer Brother    Colon cancer Other        paternal and maternal grandfather   Meniere's disease Other    Anesthesia problems Neg Hx    Hypotension Neg Hx    Malignant hyperthermia Neg Hx    Pseudochol deficiency Neg Hx    Family Psychiatric  History:  None Reported   Social History:  Social History   Substance and Sexual Activity  Alcohol Use No   Comment: not since June 2012     Social History   Substance and Sexual Activity  Drug Use Yes   Types: Marijuana   Comment: patient denies any-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    Social History   Socioeconomic History   Marital status: Legally Separated    Spouse name: Not on file   Number of children: Not on file   Years of education: Not on file   Highest education level: Not on file  Occupational History   Not on file  Tobacco Use   Smoking status: Every Day    Current packs/day: 0.00    Average packs/day: 0.5 packs/day for 37.3 years (18.7 ttl pk-yrs)    Types: Cigarettes    Start date: 07/07/1981    Last attempt to quit: 11/05/2018    Years since quitting: 5.5   Smokeless tobacco: Never  Vaping Use   Vaping status: Never Used  Substance and Sexual Activity   Alcohol use: No    Comment: not since June 2012   Drug use: Yes    Types: Marijuana    Comment: patient denies any-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   Sexual activity: Never    Birth control/protection: Surgical  Other Topics Concern   Not on file  Social History Narrative   Not on file   Social Drivers of Health   Financial Resource Strain: Not on file  Food Insecurity: Food Insecurity Present (02/18/2024)   Hunger Vital Sign     Worried About Running Out of Food in the Last Year: Sometimes true    Ran Out of Food in the Last Year: Sometimes true  Transportation Needs: Unmet Transportation Needs (02/18/2024)   PRAPARE - Administrator, Civil Service (Medical): Yes    Lack of Transportation (Non-Medical): Yes  Physical Activity: Not on file  Stress: Not on file  Social Connections: Unknown (10/14/2021)   Received from [facility]   Social Connections    Do your friends and family support you?: Not on file    What agencies support  you?: Not on file    Current Medications: Current Facility-Administered Medications  Medication Dose Route Frequency Provider Last Rate Last Admin   acetaminophen  (TYLENOL ) tablet 650 mg  650 mg Oral Q6H PRN White, Patrice L, NP   650 mg at 05/04/24 1840   alum & mag hydroxide-simeth (MAALOX/MYLANTA) 200-200-20 MG/5ML suspension 30 mL  30 mL Oral Q4H PRN White, Patrice L, NP   30 mL at 04/25/24 2207   benztropine  (COGENTIN ) tablet 1 mg  1 mg Oral BID White, Patrice L, NP   1 mg at 05/05/24 0848   divalproex  (DEPAKOTE ) DR tablet 750 mg  750 mg Oral Q12H Ji, Andrew, MD   750 mg at 05/05/24 0848   haloperidol  (HALDOL ) tablet 10 mg  10 mg Oral Q12H Ji, Andrew, MD   10 mg at 05/05/24 0848   hydrOXYzine  (ATARAX ) tablet 25 mg  25 mg Oral TID PRN White, Patrice L, NP   25 mg at 05/02/24 2236   magnesium  hydroxide (MILK OF MAGNESIA) suspension 30 mL  30 mL Oral Daily PRN White, Patrice L, NP       nicotine  polacrilex (NICORETTE ) gum 2 mg  2 mg Oral PRN Trudy Carwin, NP   2 mg at 04/23/24 1827   OLANZapine  (ZYPREXA ) injection 10 mg  10 mg Intramuscular TID PRN White, Patrice L, NP       OLANZapine  (ZYPREXA ) injection 5 mg  5 mg Intramuscular TID PRN White, Patrice L, NP       OLANZapine  zydis (ZYPREXA ) disintegrating tablet 5 mg  5 mg Oral TID PRN White, Patrice L, NP   5 mg at 04/18/24 0103   ondansetron  (ZOFRAN -ODT) disintegrating tablet 4 mg  4 mg Oral Q8H PRN Lynnette Barter, MD   4 mg at 05/04/24 1840   traZODone  (DESYREL ) tablet 50 mg  50 mg Oral QHS White, Patrice L, NP   50 mg at 05/04/24 2039   ziprasidone  (GEODON ) capsule 60 mg  60 mg Oral BID WC Marry Clamp, MD   60 mg at 05/05/24 0848    Lab Results:  No results found for this or any previous visit (from the past 48 hours).   Blood Alcohol level:  Lab Results  Component Value Date   [facility] <15 02/17/2024   ETH <15 01/14/2024    Metabolic Disorder Labs: Lab Results  Component Value Date   HGBA1C 5.1 01/18/2024   MPG 99.67 01/18/2024   MPG 105.41 05/11/2019   Lab Results  Component Value Date   PROLACTIN 24.1 (H) 05/11/2019   Lab Results  Component Value Date   CHOL 154 01/18/2024   TRIG 180 (H) 01/18/2024   HDL 49 01/18/2024   CHOLHDL 3.1 01/18/2024   VLDL 36 01/18/2024   LDLCALC 69 01/18/2024   LDLCALC 82 05/11/2019    Mental Status exam: Appearance: white female of slightly elevated BMI, fair hygiene, seen resting in bed  Eye contact: good  Attitude towards examiner  cooperative, pleasant Psychomotor: no agitation or retardation Speech: reduced amount, infrequent one-word responses  Language: simplistic  Mood: good Affect: congruent, euthymic, somnolent     Thought content: denying SI, HI, not overtly expressing delusional content Thought Process: remains confused and concrete, childlike  Perception: denying AVH, not overtly RTIS Insight: poor  Judgement: poor   Orientation: to self and hospital only - not able to accurately report date, situation  Attention/Concentration: limited due to cognitive deficits   Memory/Cognition: poor - patient's most recent MOCA on 9/17 was an  8 with significant impairments in attention and memory.  Fund of Knowledge: below average     Musculoskeletal: Strength & Muscle Tone: within normal limits Gait & Station: normal Patient leans: N/A    Physical Exam Constitutional:      Appearance: the patient is not  toxic-appearing.  Pulmonary:     Effort: Pulmonary effort is normal.  Neurological:     General: No focal deficit present.     Oriented to self and hospital only, does not verbalize date or situation HEENT:     Eyes EOMI   Review of Systems  Respiratory:  Negative for shortness of breath.   Cardiovascular:  Negative for chest pain.  Gastrointestinal:  Negative for abdominal pain, constipation, diarrhea, nausea and vomiting.  Neurological:  Negative for headaches.   Blood pressure 104/66, pulse 77, temperature (!) 97.5 F (36.4 C), temperature source Oral, resp. rate 20, height 5' 6 (1.676 m), weight 80.7 kg, SpO2 98%. Body mass index is 28.71 kg/m.   Treatment Plan Summary: Daily contact with patient to assess and evaluate symptoms and progress in treatment and Medication management  Assessment Allison Bolton is a 53 yr old female who presented on 02/17/24 to [facility] ED with a chief complaint of anxiety. In the ED, she was found to be acutely psychotic with tangential speech, agitation, and delusional thought content. Patient was admitted involuntarily to [facility] on 8/14.  Although substance-induced psychosis has previously been documented, she additional presents with prior reported manic episodes and a bipolar 1 diagnosis has been most reflective of this. On this admission, presented with agitation and increased rate of speech, initially had significantly poor sleep and psychotic symptoms, disorganization of thoughts, behavior, concern for AVH.  After approximately 1 week manic symptoms had largely resolved with antipsychotic medications and mood stabilizers however she remained with disorganization in thoughts and behavior and poor self care initially concerning for ongoing psychosis but after extended observation appears more in line with underlying major neurocognitive disorder due to multiple etiologies. She has significant cognitive impairments with most recent MOCA scoring at  8 and recommendation for 24 hr supervision and help/prompting ADLs. She has been noted to be childlike in her interactions and requires direction to eat and shower but is otherwise pleasant and cooperative with care with no overt psychotic symptoms. Suspect multifactorial causes of cognitive impairment including longstanding substance use, recurrent manic/psychotic episodes, possible underlying low functioning (unknown).   Over the last 3-4 weeks the patient has been pleasant, denying AVH, somewhat more engaged in exam and with better eye contact. She has not been seen to be RTIS or overtly expressing delusional content. She remains psychiatrically stable. Presentation remains reflective of treated bipolar disorder and ongoing neurocognitive impairments. DSS SW assisting with finding placement with goal of discharging within the next week.     DSM-5 diagnoses: Bipolar 1 Disorder, most recent episode manic, severe, with psychotic features    -currently resolved: no active symptoms of mania or psychosis, just cognitive deficits 2.  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 History of substance induced psychotic disorder   Plan: Legal status: Invol/voluntary by guardian  Psychiatric medications:  -Continue Haldol  10 mg q12 for psychosis and mood stability -Continue Geodon  60 mg bid -Continue Depakote  DR 750 mg q12 for mood stability -Continue Cogentin  1 mg BID for Drug Induced EPS -Continue Agitation Protocol: Zyprexa    Nicotine  Dependence: -Continue Nicotine  Gum 2 mg PRN  Medical concerns: -UTI with U/A noted resistance to bactrim  -Continue  Cipro 500 mg BID x 10 days   -initiated 10/13  Additional PRNs -Continue Zofran  4 mg q8 PRN nausea/vomiting -Continue PRN's: Tylenol , Maalox, Atarax , Milk of Magnesia, Trazodone    --  The risks/benefits/side-effects/alternatives to medications were discussed in detail with the patient and time was given for questions. The  patient consents to medication trials.                -- Metabolic profile and EKG monitoring obtained while on an atypical antipsychotic (BMI: 28.73 Lipid Panel: WNL except Trig: 180 HbgA1c: 5.1)              -- Encouraged patient to participate in unit milieu and in scheduled group therapies              -- Short Term Goals: Ability to identify changes in lifestyle to reduce recurrence of condition will improve, Ability to verbalize feelings will improve, Ability to disclose and discuss suicidal ideas, Ability to demonstrate self-control will improve, Ability to identify and develop effective coping behaviors will improve, Ability to maintain clinical measurements within normal limits will improve, Compliance with prescribed medications will improve, and Ability to identify triggers associated with substance abuse/mental health issues will improve             -- Long Term Goals: Improvement in symptoms so as ready for discharge   Safety and Monitoring:             -- Involuntary admission to inpatient psychiatric unit for safety, stabilization and treatment             -- Daily contact with patient to assess and evaluate symptoms and progress in treatment             -- Patient's case to be discussed in multi-disciplinary team meeting             -- Observation Level : q15 minute checks             -- Vital signs:  q12 hours             -- Precautions: suicide, elopement, and assault  Discharge Planning:             -appreciate SW assistance in discharging  [facility] SW now involved and will be getting the patient a guardian and assisting in finding placement. Level 2 filled out and guardianship hearing was on 10/7 - she now has guardian.   -initial placement fell through, APS SW assisting with referrals to alternative group homes   Leita LOISE Arts, MD 05/05/2024, 12:58 PM

## 2024-05-05 NOTE — Progress Notes (Addendum)
 Collateral contact - Amber Cosme from [employer] [phone]   Jeoffrey said that she will pick up patient on Saturday, 11/1 between 6 PM - 7 PM or Sunday, 11/2 at 10 AM.   [facility], [address], [place], [place] [address], [phone].   Medication management:  Chrystal Gammon, NP.   CSW emailed the [facility] to the group home [email or web address] via secure email (the Legal Guardian gave verbal permission).   Collateral contact - DSS Adult Protective Services Social Worker Izetta Roses [phone]   Legal guardian gave verbal permission to send the FL2 to the group home.   Adylynn Hertenstein, LCSWA 05/05/2024

## 2024-05-05 NOTE — Group Note (Signed)
 Recreation Therapy Group Note   Group Topic:Personal Development  Group Date: 05/05/2024 Start Time: 1005 End Time: 1030 Facilitators: Broderick Fonseca-McCall, LRT,CTRS Location: [address] Day[address]   Group Topic: Triggers  Goal Area(s) Addresses:  Patient will identify their biggest triggers.   Patient will identify how they avoid their triggers.  Patient will identify how they deal with triggers head on.   Behavioral Response: Confused  Intervention: Worksheets  Activity: LRT attempted to discuss triggers with patients. Patients identified the things that trigger them. Patients would then identify how they would limit their exposure to their triggers and then identify how they would deal with them head on.   Education: Triggers, Discharge Planning   Education Outcome: Acknowledges education/In group clarification offered/Needs additional education.    Affect/Mood: Appropriate   Participation Level: Moderate   Participation Quality: Independent   Behavior: Cooperative   Speech/Thought Process: Disorganized   Insight: Lacking   Judgement: Lacking    Modes of Intervention: Worksheet   Patient Response to Interventions:  Challenging    Education Outcome:  In group clarification offered    Clinical Observations/Individualized Feedback: Pt was bright as usual. Pt responses had nothing to do with the topic. Pt identified her triggers as hair, nails and big boopie. Pt expressed avoiding these through her sister, house and pop. Pt faces triggers head on by arm, yes/no and meck.     Plan: Continue to engage patient in RT group sessions 2-3x/week.   Atanacio Melnyk-McCall, LRT,CTRS 05/05/2024 10:58 AM

## 2024-05-05 NOTE — Plan of Care (Signed)
  Problem: Coping: Goal: Ability to verbalize frustrations and anger appropriately will improve Outcome: Progressing   Problem: Safety: Goal: Periods of time without injury will increase Outcome: Progressing   Problem: Education: Goal: Knowledge of [facility] Education information/materials will improve Outcome: Not Progressing   Problem: Activity: Goal: Interest or engagement in activities will improve Outcome: Not Progressing

## 2024-05-05 NOTE — Plan of Care (Signed)
   Problem: Education: Goal: Knowledge of [facility] Education information/materials will improve Outcome: Progressing Goal: Emotional status will improve Outcome: Progressing Goal: Mental status will improve Outcome: Progressing   Problem: Activity: Goal: Interest or engagement in activities will improve Outcome: Progressing

## 2024-05-05 NOTE — Progress Notes (Signed)
 Collateral contact [facility])  Patient continues to be on the waiting list.   Navneet Schmuck, LCSWA 05/05/2024

## 2024-05-06 MED ORDER — IBUPROFEN 600 MG PO TABS
600.0000 mg | ORAL_TABLET | Freq: Once | ORAL | Status: AC
  Administered 2024-05-06: 600 mg via ORAL
  Filled 2024-05-06: qty 1

## 2024-05-06 NOTE — Group Note (Signed)
 Recreation Therapy Group Note   Group Topic:Problem Solving  Group Date: 05/06/2024 Start Time: 1035 End Time: 1045 Facilitators: Jeffree Cazeau-McCall, LRT,CTRS Location: [address] Dayroom   Group Topic: Halloween Trivia  Goal Area(s) Addresses:  Patient will effectively work in a team with other group members. Patient will verbalize importance of using appropriate problem solving techniques.   Behavioral Response: Observant  Intervention: Trivia and Art  Activity: Journalist, Newspaper. LRT would ask patients fun trivia questions about some of the basic elements of Halloween and what makes it fun. Patients were also given options between 2 pumpkin outline templates to which they could create and decorate how they chose to.  Education: Journalist, Newspaper, Team Building, Communication  Education Outcome: Acknowledges understanding/In group clarification offered/Needs additional education.    Affect/Mood: Appropriate   Participation Level: None   Participation Quality: None   Behavior: On-looking   Speech/Thought Process: Barely audible    Insight: Lacking   Judgement: Lacking    Modes of Intervention: Art and Cooperative Play   Patient Response to Interventions:  Disengaged   Education Outcome:  In group clarification offered    Clinical Observations/Individualized Feedback: Pt was confused and focused on food. Pt couldn't focus on the group itself. Pt stayed for a short time before leaving but returned. Pt eventually left again and didn't return.     Plan: Continue to engage patient in RT group sessions 2-3x/week.   Raneisha Bress-McCall, LRT,CTRS 05/06/2024 12:52 PM

## 2024-05-06 NOTE — Progress Notes (Signed)
 [facility] MD Progress Note  05/06/2024 8:29 AM Allison Bolton  MRN:  [id number]  Principal Problem: Bipolar affective disorder, most recent episode manic with psychotic symptoms (HCC)     Major Neurocognitive Disorder due to multiple etiologies  Diagnosis: Principal Problem:   Bipolar affective disorder, current episode manic with psychotic symptoms (HCC)  Major Neurocognitive Disorder due to multiple etiologies    Total Time spent with patient:  I personally spent 25 minutes on the unit in direct patient care. The direct patient care time included face-to-face time with the patient, reviewing the patient's chart, communicating with other professionals, and coordinating care.   Identifying Information and brief psychiatric history:  Allison Bolton is a 53 yr old female with working psychiatric diagnoses of bipolar 1 disorder and major neurocognitive disorder. 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 She does have a history of prior psychiatric hospitalizations with substance use driving psychotic symptoms, however on this admission has presented with clear manic and psychotic symptoms that cleared with time and medications, however underlying neurocognitive deficits have remained. She has one prior suicide Attempt (2007), and Prior Psychiatric Hospitalizations (last- [facility] 01/2024).  On this admission she presented to [facility] ED on 02/17/24  with a chief complaint of anxiety. In the ED, she was found to be acutely psychotic with tangential speech, agitation, and delusional thought content. Patient was admitted involuntarily to [facility] on 8/14. Since admission the patient has been notably unresponsive to antipsychotic medications, largely related to poor self-care appearing disheveled and requiring direction to do ADLs. Although initially this was concerning for lingering psychotic symptoms, several OT evaluations have noted significant cognitive deficits  and poor self care is now thought to be related to major neurocognitive disorder.   Interval events:  Patient has been accepted to group home with planned discharge to [facility] on Sunday. Per staff patient has been pleasant, attending some groups with minimal ability to engage, medication compliant and no behavioral concerns. Vitals: BP 130/71 (BP Location: Left Arm)   Pulse 60   Temp (!) 97.5 F (36.4 C) (Oral)   Resp 17   Ht 5' 6 (1.676 m)   Wt 80.7 kg   SpO2 97%   BMI 28.71 kg/m    Interview today:  Today the patient states she is good. Notified that she was accepted and should be leaving over the weekend and was happy to hear she had somewhere to go. No additional concerns voiced today. No SI, HI or AVH reported.    Past Medical History:  Past Medical History:  Diagnosis Date   Anxiety    Arthritis    Asthma    Bipolar 1 disorder (HCC)    Diverticulosis    Dysrhythmia    sts I have heart palpitations   Fibromyalgia    H/O hiatal hernia    4   Headache(784.0)    High blood pressure    Meniere's disease    S/P colonoscopy    Dr. Golda 2010: few small diverticula at sigmoid. otherwise normal.    Shortness of breath     Past Surgical History:  Procedure Laterality Date   ABDOMINAL HYSTERECTOMY     APPENDECTOMY     CESAREAN SECTION     CHOLECYSTECTOMY     COLONOSCOPY N/A 12/25/2023   Procedure: COLONOSCOPY;  Surgeon: Eartha Angelia Sieving, MD;  Location: [facility];  Service: Gastroenterology;  Laterality: N/A;  11:30am, asa 1   exploratory laparoscopy  FOOT SURGERY     HEMORRHOID SURGERY     LAPAROSCOPIC APPENDECTOMY  02/19/2011   Procedure: APPENDECTOMY LAPAROSCOPIC;  Surgeon: Oneil DELENA Budge;  Location: [facility] OR[facility];  Service: General;  Laterality: N/A;   LAPAROSCOPY  02/19/2011   Procedure: LAPAROSCOPY DIAGNOSTIC;  Surgeon: Oneil DELENA Budge;  Location: [facility];  Service: General;  Laterality: N/A;   left ovarian removal     multiple hernia repairs      Right ovarian removal     TONSILLECTOMY     TONSILLECTOMY AND ADENOIDECTOMY     tubes in ears     UMBILICAL HERNIA REPAIR  Dec 2011   Dr. Budge   wisdom teeth removal     Family History:  Family History  Problem Relation Age of Onset   Thyroid  disease Mother    Colon cancer Father    Breast cancer Maternal Aunt    Colon cancer Brother    Colon cancer Other        paternal and maternal grandfather   Meniere's disease Other    Anesthesia problems Neg Hx    Hypotension Neg Hx    Malignant hyperthermia Neg Hx    Pseudochol deficiency Neg Hx    Family Psychiatric  History:  None Reported   Social History:  Social History   Substance and Sexual Activity  Alcohol Use No   Comment: not since June 2012     Social History   Substance and Sexual Activity  Drug Use Yes   Types: Marijuana   Comment: patient denies any-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    Social History   Socioeconomic History   Marital status: Legally Separated    Spouse name: Not on file   Number of children: Not on file   Years of education: Not on file   Highest education level: Not on file  Occupational History   Not on file  Tobacco Use   Smoking status: Every Day    Current packs/day: 0.00    Average packs/day: 0.5 packs/day for 37.3 years (18.7 ttl pk-yrs)    Types: Cigarettes    Start date: 07/07/1981    Last attempt to quit: 11/05/2018    Years since quitting: 5.5   Smokeless tobacco: Never  Vaping Use   Vaping status: Never Used  Substance and Sexual Activity   Alcohol use: No    Comment: not since June 2012   Drug use: Yes    Types: Marijuana    Comment: patient denies any-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   Sexual activity: Never    Birth control/protection: Surgical  Other Topics Concern   Not on file  Social History Narrative   Not on file   Social Drivers of Health   Financial Resource Strain: Not on file  Food Insecurity: Food Insecurity Present  (02/18/2024)   Hunger Vital Sign    Worried About Running Out of Food in the Last Year: Sometimes true    Ran Out of Food in the Last Year: Sometimes true  Transportation Needs: Unmet Transportation Needs (02/18/2024)   PRAPARE - Administrator, Civil Service (Medical): Yes    Lack of Transportation (Non-Medical): Yes  Physical Activity: Not on file  Stress: Not on file  Social Connections: Unknown (10/14/2021)   Received from [facility]   Social Connections    Do your friends and family support you?: Not on file    What agencies support  you?: Not on file    Current Medications: Current Facility-Administered Medications  Medication Dose Route Frequency Provider Last Rate Last Admin   acetaminophen  (TYLENOL ) tablet 650 mg  650 mg Oral Q6H PRN White, Patrice L, NP   650 mg at 05/04/24 1840   alum & mag hydroxide-simeth (MAALOX/MYLANTA) 200-200-20 MG/5ML suspension 30 mL  30 mL Oral Q4H PRN White, Patrice L, NP   30 mL at 04/25/24 2207   benztropine  (COGENTIN ) tablet 1 mg  1 mg Oral BID White, Patrice L, NP   1 mg at 05/05/24 1630   divalproex  (DEPAKOTE ) DR tablet 750 mg  750 mg Oral Q12H Ji, Andrew, MD   750 mg at 05/05/24 2033   haloperidol  (HALDOL ) tablet 10 mg  10 mg Oral Q12H Ji, Andrew, MD   10 mg at 05/05/24 2033   hydrOXYzine  (ATARAX ) tablet 25 mg  25 mg Oral TID PRN White, Patrice L, NP   25 mg at 05/05/24 2033   magnesium  hydroxide (MILK OF MAGNESIA) suspension 30 mL  30 mL Oral Daily PRN White, Patrice L, NP       nicotine  polacrilex (NICORETTE ) gum 2 mg  2 mg Oral PRN Trudy Carwin, NP   2 mg at 04/23/24 1827   OLANZapine  (ZYPREXA ) injection 10 mg  10 mg Intramuscular TID PRN White, Patrice L, NP       OLANZapine  (ZYPREXA ) injection 5 mg  5 mg Intramuscular TID PRN White, Patrice L, NP       OLANZapine  zydis (ZYPREXA ) disintegrating tablet 5 mg  5 mg Oral TID PRN White, Patrice L, NP   5 mg at 04/18/24 0103   ondansetron  (ZOFRAN -ODT) disintegrating  tablet 4 mg  4 mg Oral Q8H PRN Lynnette Barter, MD   4 mg at 05/05/24 1416   traZODone  (DESYREL ) tablet 50 mg  50 mg Oral QHS White, Patrice L, NP   50 mg at 05/05/24 2033   ziprasidone  (GEODON ) capsule 60 mg  60 mg Oral BID WC Marry Clamp, MD   60 mg at 05/05/24 1630    Lab Results:  No results found for this or any previous visit (from the past 48 hours).   Blood Alcohol level:  Lab Results  Component Value Date   [facility] <15 02/17/2024   ETH <15 01/14/2024    Metabolic Disorder Labs: Lab Results  Component Value Date   HGBA1C 5.1 01/18/2024   MPG 99.67 01/18/2024   MPG 105.41 05/11/2019   Lab Results  Component Value Date   PROLACTIN 24.1 (H) 05/11/2019   Lab Results  Component Value Date   CHOL 154 01/18/2024   TRIG 180 (H) 01/18/2024   HDL 49 01/18/2024   CHOLHDL 3.1 01/18/2024   VLDL 36 01/18/2024   LDLCALC 69 01/18/2024   LDLCALC 82 05/11/2019    Mental Status exam: Appearance: white female of slightly elevated BMI, fair hygiene, seen resting in bed  Eye contact: good  Attitude towards examiner  cooperative, pleasant Psychomotor: no agitation or retardation Speech: reduced amount, infrequent one-word responses  Language: simplistic  Mood: good Affect: congruent, euthymic, somnolent     Thought content: denying SI, HI, not overtly expressing delusional content Thought Process: remains confused and concrete, childlike  Perception: denying AVH, not overtly RTIS Insight: poor  Judgement: poor   Orientation: to self and hospital only - not able to accurately report date, situation  Attention/Concentration: limited due to cognitive deficits   Memory/Cognition: poor - patient's most recent MOCA on 9/17 was an  8 with significant impairments in attention and memory.  Fund of Knowledge: below average     Musculoskeletal: Strength & Muscle Tone: within normal limits Gait & Station: normal Patient leans: N/A    Physical Exam Constitutional:      Appearance:  the patient is not toxic-appearing.  Pulmonary:     Effort: Pulmonary effort is normal.  Neurological:     General: No focal deficit present.     Oriented to self and hospital only, does not verbalize date or situation HEENT:     Eyes EOMI   Review of Systems  Respiratory:  Negative for shortness of breath.   Cardiovascular:  Negative for chest pain.  Gastrointestinal:  Negative for abdominal pain, constipation, diarrhea, nausea and vomiting.  Neurological:  Negative for headaches.   Blood pressure 130/71, pulse 60, temperature (!) 97.5 F (36.4 C), temperature source Oral, resp. rate 17, height 5' 6 (1.676 m), weight 80.7 kg, SpO2 97%. Body mass index is 28.71 kg/m.   Treatment Plan Summary: Daily contact with patient to assess and evaluate symptoms and progress in treatment and Medication management  Assessment KEILA TURAN is a 53 yr old female who presented on 02/17/24 to [facility] ED with a chief complaint of anxiety. In the ED, she was found to be acutely psychotic with tangential speech, agitation, and delusional thought content. Patient was admitted involuntarily to [facility] on 8/14.  Although substance-induced psychosis has previously been documented, she additional presents with prior reported manic episodes and a bipolar 1 diagnosis has been most reflective of this. On this admission, presented with agitation and increased rate of speech, initially had significantly poor sleep and psychotic symptoms, disorganization of thoughts, behavior, concern for AVH.  After approximately 1 week manic symptoms had largely resolved with antipsychotic medications and mood stabilizers however she remained with disorganization in thoughts and behavior and poor self care initially concerning for ongoing psychosis but after extended observation appears more in line with underlying major neurocognitive disorder due to multiple etiologies. She has significant cognitive impairments with most  recent MOCA scoring at 8 and recommendation for 24 hr supervision and help/prompting ADLs. She has been noted to be childlike in her interactions and requires direction to eat and shower but is otherwise pleasant and cooperative with care with no overt psychotic symptoms. Suspect multifactorial causes of cognitive impairment including longstanding substance use, recurrent manic/psychotic episodes, possible underlying low functioning (unknown).   Over the last 3-4 weeks the patient has been pleasant, denying AVH, somewhat more engaged in exam and with better eye contact. She has not been seen to be RTIS or overtly expressing delusional content. She remains psychiatrically stable. Presentation remains reflective of treated bipolar disorder and ongoing neurocognitive impairments. DSS SW assisting with finding placement with goal of discharging within the next week.     DSM-5 diagnoses: Bipolar 1 Disorder, most recent episode manic, severe, with psychotic features    -currently resolved: no active symptoms of mania or psychosis, just cognitive deficits 2.  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 History of substance induced psychotic disorder   Plan: Legal status: Invol/voluntary by guardian  Psychiatric medications:  -Continue Haldol  10 mg q12 for psychosis and mood stability -Continue Geodon  60 mg bid -Continue Depakote  DR 750 mg q12 for mood stability -Continue Cogentin  1 mg BID for Drug Induced EPS -Continue Agitation Protocol: Zyprexa    Nicotine  Dependence: -Continue Nicotine  Gum 2 mg PRN  Medical concerns: -UTI with U/A noted resistance to bactrim  -Continue  Cipro 500 mg BID x 10 days   -initiated 10/13  Additional PRNs -Continue Zofran  4 mg q8 PRN nausea/vomiting -Continue PRN's: Tylenol , Maalox, Atarax , Milk of Magnesia, Trazodone    --  The risks/benefits/side-effects/alternatives to medications were discussed in detail with the patient and time was given  for questions. The patient consents to medication trials.                -- Metabolic profile and EKG monitoring obtained while on an atypical antipsychotic (BMI: 28.73 Lipid Panel: WNL except Trig: 180 HbgA1c: 5.1)              -- Encouraged patient to participate in unit milieu and in scheduled group therapies              -- Short Term Goals: Ability to identify changes in lifestyle to reduce recurrence of condition will improve, Ability to verbalize feelings will improve, Ability to disclose and discuss suicidal ideas, Ability to demonstrate self-control will improve, Ability to identify and develop effective coping behaviors will improve, Ability to maintain clinical measurements within normal limits will improve, Compliance with prescribed medications will improve, and Ability to identify triggers associated with substance abuse/mental health issues will improve             -- Long Term Goals: Improvement in symptoms so as ready for discharge   Safety and Monitoring:             -- Involuntary admission to inpatient psychiatric unit for safety, stabilization and treatment             -- Daily contact with patient to assess and evaluate symptoms and progress in treatment             -- Patient's case to be discussed in multi-disciplinary team meeting             -- Observation Level : q15 minute checks             -- Vital signs:  q12 hours             -- Precautions: suicide, elopement, and assault  Discharge Planning:             -appreciate SW assistance in discharging  [facility] SW now involved and will be getting the patient a guardian and assisting in finding placement. Level 2 filled out and guardianship hearing was on 10/7 - she now has guardian.   -patient has been accepted to [facility] group home planned D/C 8 am on Sunday 05/08/24   Leita LOISE Arts, MD 05/06/2024, 8:29 AM

## 2024-05-06 NOTE — Progress Notes (Signed)
(  Sleep Hours) - 2.5 hours (Any PRNs that were needed, meds refused, or side effects to meds)- atarax  (Any disturbances and when (visitation, over night) (Concerns raised by the patient)- fell during shift sent to [facility] for med clear (SI/HI/AVH)- denies

## 2024-05-06 NOTE — ED Provider Notes (Signed)
 [place] EMERGENCY DEPARTMENT AT [facility] Provider Note   CSN: [id number] Arrival date & time: 05/05/24  2250     Patient presents with: Fall and Knee Pain   Allison Bolton is a 53 y.o. female with history of GAD, bipolar 1 disorder, IBS, substance-induced mood disorder.  Presents to ED for evaluation of right knee pain.  States that she is currently at the behavioral urgent care.  States that she fell earlier today after she became lightheaded after standing.  Reports that she fell onto her right knee.  States she has pain there, history of osteoarthritis.  Denies hitting her head.  Denies blood thinners.  Denies medication prior to arrival.   Fall  Knee Pain      Prior to Admission medications   Medication Sig Start Date End Date Taking? Authorizing Provider  albuterol  (VENTOLIN  HFA) 108 (90 Base) MCG/ACT inhaler Inhale 2 puffs into the lungs every 4 (four) hours as needed for wheezing or shortness of breath.    [provider]  benztropine  (COGENTIN ) 1 MG tablet Take 1 tablet (1 mg total) by mouth 2 (two) times daily. 02/03/24   Millington, Matthew E, PA-C  divalproex  (DEPAKOTE ) 250 MG DR tablet Take 3 tablets (750 mg total) by mouth every 12 (twelve) hours. 02/03/24   Millington, Matthew E, PA-C  fluPHENAZine  (PROLIXIN ) 5 MG tablet Take 1 tablet (5 mg total) by mouth 3 (three) times daily. 02/03/24   Millington, Matthew E, PA-C  hydrOXYzine  (ATARAX ) 25 MG tablet Take 1 tablet (25 mg total) by mouth 3 (three) times daily as needed for anxiety. 02/03/24   Millington, Matthew E, PA-C  nicotine  (NICODERM CQ  - DOSED IN MG/24 HOURS) 14 mg/24hr patch Place 1 patch (14 mg total) onto the skin daily. 02/04/24   Millington, Matthew E, PA-C  ondansetron  (ZOFRAN -ODT) 4 MG disintegrating tablet Take 4 mg by mouth every 8 (eight) hours as needed for nausea or vomiting. 02/08/24   [provider]  traZODone  (DESYREL ) 50 MG tablet Take 1 tablet (50 mg total) by mouth  at bedtime as needed for sleep. 02/03/24   Millington, Matthew E, PA-C  estrogens , conjugated, (PREMARIN ) 0.625 MG tablet Take 0.625 mg by mouth daily.   09/11/11  [provider]    Allergies: Amoxicillin, Tramadol, and Amoxicillin-pot clavulanate    Review of Systems  Musculoskeletal:  Positive for arthralgias.  All other systems reviewed and are negative.   Updated Vital Signs BP 130/71 (BP Location: Left Arm)   Pulse 60   Temp (!) 97.5 F (36.4 C) (Oral)   Resp 17   Ht 5' 6 (1.676 m)   Wt 80.7 kg   SpO2 97%   BMI 28.71 kg/m   Physical Exam Vitals and nursing note reviewed.  Constitutional:      General: She is not in acute distress.    Appearance: She is well-developed.  HENT:     Head: Normocephalic and atraumatic.  Eyes:     Conjunctiva/sclera: Conjunctivae normal.  Cardiovascular:     Rate and Rhythm: Normal rate and regular rhythm.     Heart sounds: No murmur heard. Pulmonary:     Effort: Pulmonary effort is normal. No respiratory distress.     Breath sounds: Normal breath sounds.  Abdominal:     Palpations: Abdomen is soft.     Tenderness: There is no abdominal tenderness.  Musculoskeletal:        General: No swelling.     Cervical back: Neck  supple.     Comments: Full ROM of right knee.  No deformity.  Negative Lachman test.  Skin:    General: Skin is warm and dry.     Capillary Refill: Capillary refill takes less than 2 seconds.  Neurological:     Mental Status: She is alert.  Psychiatric:        Mood and Affect: Mood normal.     (all labs ordered are listed, but only abnormal results are displayed) Labs Reviewed  URINE CULTURE - Abnormal; Notable for the following components:      Result Value   Culture   (*)    Value: >=100,000 COLONIES/mL ESCHERICHIA COLI 20,000 COLONIES/mL OLIGELLA URETHRALIS Standardized susceptibility testing for this organism is not available. Performed at [facility], [address], [place], [place]  [address]    Organism ID, Bacteria ESCHERICHIA COLI (*)    All other components within normal limits  URINE CULTURE - Abnormal; Notable for the following components:   Culture >=100,000 COLONIES/mL ESCHERICHIA COLI (*)    Organism ID, Bacteria ESCHERICHIA COLI (*)    All other components within normal limits  COMPREHENSIVE METABOLIC PANEL WITH GFR - Abnormal; Notable for the following components:   Total Protein 6.1 (*)    AST 12 (*)    All other components within normal limits  CBC WITH DIFFERENTIAL/PLATELET - Abnormal; Notable for the following components:   RBC 5.42 (*)    Hemoglobin 15.4 (*)    HCT 46.9 (*)    All other components within normal limits  COMPREHENSIVE METABOLIC PANEL WITH GFR - Abnormal; Notable for the following components:   Total Protein 6.4 (*)    AST 14 (*)    All other components within normal limits  URINALYSIS, W/ REFLEX TO CULTURE (INFECTION SUSPECTED) - Abnormal; Notable for the following components:   APPearance CLOUDY (*)    Protein, ur 30 (*)    Leukocytes,Ua LARGE (*)    Bacteria, UA MANY (*)    All other components within normal limits  URINALYSIS, W/ REFLEX TO CULTURE (INFECTION SUSPECTED) - Abnormal; Notable for the following components:   APPearance CLOUDY (*)    Hgb urine dipstick SMALL (*)    Protein, ur 30 (*)    Nitrite POSITIVE (*)    Leukocytes,Ua LARGE (*)    Bacteria, UA FEW (*)    All other components within normal limits  CBC WITH DIFFERENTIAL/PLATELET - Abnormal; Notable for the following components:   RDW 15.9 (*)    All other components within normal limits  VALPROIC  ACID LEVEL  VALPROIC  ACID LEVEL  CBC WITH DIFFERENTIAL/PLATELET  VALPROIC  ACID LEVEL  QUANTIFERON-TB GOLD PLUS  QUANTIFERON-TB GOLD PLUS (RQFGPL)  COMPREHENSIVE METABOLIC PANEL WITH GFR  VALPROIC  ACID LEVEL    EKG: None  Radiology: DG Knee Complete 4 Views Right Result Date: 05/06/2024 EXAM: 4 VIEW(S) XRAY OF THE RIGHT KNEE 05/05/2024 11:55:34 PM  COMPARISON: None available. CLINICAL HISTORY: Fall. FINDINGS: BONES AND JOINTS: No acute fracture. No focal osseous lesion. No joint dislocation. No significant joint effusion. Mild tricompartmental osteophyte formation. Joint spaces are maintained. SOFT TISSUES: The soft tissues are unremarkable. IMPRESSION: 1. No acute findings. 2. Mild tricompartmental degenerative change. Electronically signed by: Greig Pique MD 05/06/2024 12:00 AM EDT RP Workstation: [id number]     Procedures   Medications Ordered in the ED  acetaminophen  (TYLENOL ) tablet 650 mg (650 mg Oral Given 05/04/24 1840)  alum & mag hydroxide-simeth (MAALOX/MYLANTA) 200-200-20 MG/5ML suspension 30 mL (30 mLs  Oral Given 04/25/24 2207)  magnesium  hydroxide (MILK OF MAGNESIA) suspension 30 mL (has no administration in time range)  hydrOXYzine  (ATARAX ) tablet 25 mg (25 mg Oral Given 05/05/24 2033)  traZODone  (DESYREL ) tablet 50 mg (50 mg Oral Given 05/05/24 2033)  OLANZapine  zydis (ZYPREXA ) disintegrating tablet 5 mg (5 mg Oral Given 04/18/24 0103)  OLANZapine  (ZYPREXA ) injection 10 mg (has no administration in time range)  OLANZapine  (ZYPREXA ) injection 5 mg (has no administration in time range)  benztropine  (COGENTIN ) tablet 1 mg (1 mg Oral Given 05/05/24 1630)  nicotine  polacrilex (NICORETTE ) gum 2 mg (2 mg Oral Given 04/23/24 1827)  divalproex  (DEPAKOTE ) DR tablet 750 mg (750 mg Oral Given 05/05/24 2033)  ondansetron  (ZOFRAN -ODT) disintegrating tablet 4 mg (4 mg Oral Given 05/05/24 1416)  haloperidol  (HALDOL ) tablet 10 mg (10 mg Oral Given 05/05/24 2033)  ziprasidone  (GEODON ) capsule 60 mg (60 mg Oral Given 05/05/24 1630)  ibuprofen  (ADVIL ) tablet 600 mg (has no administration in time range)  haloperidol  (HALDOL ) 5 MG tablet (0 mg  Duplicate 03/01/24 0827)  nitrofurantoin  (macrocrystal-monohydrate) (MACROBID ) capsule 100 mg (100 mg Oral Given 04/05/24 0853)  traZODone  (DESYREL ) tablet 50 mg (50 mg Oral Given 04/04/24 0048)   tuberculin injection 5 Units (5 Units Intradermal Given 04/07/24 1315)  ciprofloxacin (CIPRO) tablet 500 mg (500 mg Oral Given 04/28/24 0851)  traZODone  (DESYREL ) tablet 50 mg (50 mg Oral Given 05/02/24 2341)     Medical Decision Making Amount and/or Complexity of Data Reviewed Radiology: ordered.   53 year old female presents for right knee injury.  Please see HPI.  On exam the patient has full ROM of her right knee.  No deformity.  Negative Lachman test.  Overall nontoxic in appearance.  Hemodynamically stable.  Afebrile and nontachycardic.  X-ray imaging collected.  X-ray imaging is negative for acute process, does show arthritic changes.  Patient given ibuprofen .  She ambulates steady gait.  She has full ROM of right knee.  Advised to follow-up outpatient with her PCP and she voiced understanding.  Advised to elevate, apply ice and take ibuprofen  as needed.  Patient discharged home at this time to [facility] with safe transport.    Final diagnoses:  Acute pain of right knee  Fall, initial encounter    ED Discharge Orders     None          Ruthell Lonni JULIANNA DEVONNA 05/06/24 0056    Carita Senior, MD 05/06/24 2112

## 2024-05-06 NOTE — Plan of Care (Signed)
   Problem: Education: Goal: Mental status will improve Outcome: Progressing Goal: Verbalization of understanding the information provided will improve Outcome: Progressing   Problem: Activity: Goal: Interest or engagement in activities will improve Outcome: Progressing

## 2024-05-06 NOTE — Progress Notes (Signed)
 Collateral contact - Amber Cosme from [employer] [phone]   Jeoffrey said she will pick up patient on Sunday, 05/08/2024 at 8 AM.   Emmy Keng, LCSWA 05/06/2024

## 2024-05-06 NOTE — [facility] Group Notes (Signed)
 Adult Psychoeducational Group Note  Date:  05/06/2024 Time:  1:19 PM  Group Topic/Focus: Physical Wellness   Participation Level:  Did Not Attend  Participation Quality:    Affect:    Cognitive:    Insight:   Engagement in Group:    Modes of Intervention:    Additional Comments:    Annett Redman Ramsay 05/06/2024, 1:19 PM

## 2024-05-06 NOTE — Progress Notes (Addendum)
 Patient was walking out of the dayroom at 2040 when she fell. Staff witnessed and she hit her knee. Patient was able to bend her right leg and it appeared to be slightly swollen. Vitals were gathered and within normal limits (see flowsheet). Staff assisted her to stand and she walked to her room. Staff assisted her to bathroom and then bed. Provider notified via secure chat and acknowledged message. Daughter Leartis called with no answer.

## 2024-05-06 NOTE — ED Notes (Signed)
 Report called to [facility] adult unit.  Safe transport called

## 2024-05-06 NOTE — Discharge Instructions (Signed)
 As we discussed, no evidence of injury or fracture to your knee.  Please take ibuprofen  for pain at home.  Follow-up with your PCP.  Return to the ED with new symptoms.

## 2024-05-06 NOTE — Plan of Care (Signed)

## 2024-05-06 NOTE — [facility] Group Notes (Signed)
 Adult Psychoeducational Group Note  Date:  05/06/2024 Time:  12:07 PM  Group Topic/Focus: Recreational Therapy   Participation Level:  Active  Participation Quality:    Affect:    Cognitive:    Insight:   Engagement in Group:    Modes of Intervention:    Additional Comments:    Allison Bolton 05/06/2024, 12:07 PM

## 2024-05-06 NOTE — [facility] Group Notes (Signed)
 Adult Psychoeducational Group Note  Date:  05/06/2024 Time:  9:27 AM  Group Topic/Focus: Goals [place]   Participation Level:  Active  Participation Quality:  Appropriate  Affect:  Appropriate  Cognitive:    Insight: Limited  Engagement in Group:  Limited  Modes of Intervention:  Limit-setting  Additional Comments:  Pt. Participated to the best of her abilities.  Annett Redman Ramsay 05/06/2024, 9:27 AM

## 2024-05-06 NOTE — Care Management Important Message (Signed)
 Patient informed of right to appeal discharge, provided phone number to [facility]. Patient expressed no interest in appealing discharge at this time. CSW will continue to monitor situation.   Obie Silos, LCSWA 05/06/2024

## 2024-05-06 NOTE — [facility] Group Notes (Signed)
 Adult Psychoeducational Group Note  Date:  05/06/2024 Time:  2:51 PM  Group Topic/Focus: Physical Wellness   Participation Level:  Did Not Attend  Participation Quality:    Affect:    Cognitive:    Insight:   Engagement in Group:    Modes of Intervention:    Additional Comments:    Annett Berle Hoyer 05/06/2024, 2:51 PM

## 2024-05-07 MED ORDER — TRAZODONE HCL 50 MG PO TABS: 50.0000 mg | ORAL_TABLET | Freq: Every evening | ORAL | 0 refills | Status: AC | PRN

## 2024-05-07 MED ORDER — BENZTROPINE MESYLATE 1 MG PO TABS: 1.0000 mg | ORAL_TABLET | Freq: Two times a day (BID) | ORAL | 0 refills | Status: AC

## 2024-05-07 MED ORDER — HALOPERIDOL 10 MG PO TABS: 10.0000 mg | ORAL_TABLET | Freq: Two times a day (BID) | ORAL | 0 refills | Status: AC

## 2024-05-07 MED ORDER — ZIPRASIDONE HCL 60 MG PO CAPS
60.0000 mg | ORAL_CAPSULE | Freq: Two times a day (BID) | ORAL | 0 refills | Status: AC
Start: 2024-05-07 — End: 2024-06-06

## 2024-05-07 MED ORDER — ALBUTEROL SULFATE HFA 108 (90 BASE) MCG/ACT IN AERS: 2.0000 | INHALATION_SPRAY | RESPIRATORY_TRACT | 0 refills | Status: AC | PRN

## 2024-05-07 MED ORDER — TRAZODONE HCL 50 MG PO TABS
50.0000 mg | ORAL_TABLET | Freq: Every evening | ORAL | Status: DC | PRN
  Administered 2024-05-07: 50 mg via ORAL
  Filled 2024-05-07: qty 1

## 2024-05-07 MED ORDER — DIVALPROEX SODIUM 250 MG PO DR TAB: 750.0000 mg | DELAYED_RELEASE_TABLET | Freq: Two times a day (BID) | ORAL | 0 refills | Status: AC

## 2024-05-07 NOTE — Progress Notes (Signed)
 1:1 Nursing note:  Pt is laying in bed . Respirations are even and unlabored. Pt chest observed for rise and fall.1:1 continued for pt safety. Safety maintained.

## 2024-05-07 NOTE — Group Note (Signed)
 LCSW Group Therapy Note  Group Date: 05/07/2024 Start Time: 1015 End Time: 1115   Type of Therapy and Topic:  Group Therapy: Self-Harm Alternatives  Participation Level:  Did Not Attend   Hunter JONELLE Lever, LCSWA 05/07/2024  4:04 PM

## 2024-05-07 NOTE — Group Note (Signed)
 [facility] LCSW Group Therapy Note   Group Date: 05/07/2024 Start Time: 1000 End Time: 1100   Participation Level:  Did Not Attend   Type of Therapy/Topic:  Group Therapy:  Sleep Hygiene and Healthy Waking Habits  Description of Group:    This group will address the concept of Sleep Hygiene and Healthy Waking Habits and how it feels and looks when one is unbalanced. Patients will be encouraged to process areas in their lives that are out of balance and identify reasons for remaining unbalanced. Facilitators will guide patients utilizing problem- solving interventions to address and correct the stressor making their Sleep Hygiene and Waking Habit unbalanced. Understanding and applying new Sleep Hygiene techniques will be explored and addressed for obtaining and maintaining a balanced life.   Therapeutic Goals: Patient will identify Healthy Habits to maintain good Sleep Hygiene and Waking routine.  Patient will demonstrate ability to communicate their needs through discussion.   Summary of Patient Progress: N/A    Therapeutic Modalities:   Cognitive Behavioral Therapy Solution-Focused Therapy    Camelia Olden, LCSW

## 2024-05-07 NOTE — [facility] Group Notes (Signed)
 Patient did not attend the Social Work group.

## 2024-05-07 NOTE — Progress Notes (Signed)
 Nursing 1:1 Note  Patient is in day room with staff. Staff walks beside her for safety.

## 2024-05-07 NOTE — [facility] Group Notes (Signed)
 [facility] Assessment Progress Note          Pt did not attend group

## 2024-05-07 NOTE — Progress Notes (Signed)
 Nursing 1:1 note  Patient in room pleasant and eating dinner. Staff at bedside. Patient took meds

## 2024-05-07 NOTE — [facility] Group Notes (Signed)
 Adult Psychoeducational Group Note  Date:  05/07/2024 Time:  8:34 PM  Group Topic/Focus:  Wrap-Up Group:   The focus of this group is to help patients review their daily goal of treatment and discuss progress on daily workbooks.  Participation Level:  Did Not Attend  Allison Bolton 05/07/2024, 8:34 PM

## 2024-05-07 NOTE — Progress Notes (Signed)
 1:1 Nursing note:  Pt is laying in bed . Respirations are even and unlabored. Chest observed for rise and fall 1:1 continued for pt safety. Safety maintained.

## 2024-05-07 NOTE — Plan of Care (Signed)
   Problem: Education: Goal: Emotional status will improve Outcome: Progressing Goal: Mental status will improve Outcome: Progressing Goal: Verbalization of understanding the information provided will improve Outcome: Progressing

## 2024-05-07 NOTE — Progress Notes (Signed)
(  Sleep Hours) -9.75 (Any PRNs that were needed, meds refused, or side effects to meds)- Atarax  (Any disturbances and when (visitation, over night)-Pt was unable to swallow night time pills. Writer had to place pills in pudding for pt to take medications (Concerns raised by the patient)- no (SI/HI/AVH)-denied

## 2024-05-07 NOTE — Progress Notes (Signed)
 [facility] MD Progress Note  05/07/2024 12:04 PM Allison Bolton  MRN:  [id number]  Principal Problem: Bipolar affective disorder, most recent episode manic with psychotic symptoms (HCC)     Major Neurocognitive Disorder due to multiple etiologies  Diagnosis: Principal Problem:   Bipolar affective disorder, current episode manic with psychotic symptoms (HCC)  Major Neurocognitive Disorder due to multiple etiologies    Total Time spent with patient:  I personally spent 25 minutes on the unit in direct patient care. The direct patient care time included face-to-face time with the patient, reviewing the patient's chart, communicating with other professionals, and coordinating care.   Identifying Information and brief psychiatric history:  Allison Bolton is a 53 yr old female with working psychiatric diagnoses of bipolar 1 disorder and major neurocognitive disorder. 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 She does have a history of prior psychiatric hospitalizations with substance use driving psychotic symptoms, however on this admission has presented with clear manic and psychotic symptoms that cleared with time and medications, however underlying neurocognitive deficits have remained. She has one prior suicide Attempt (2007), and Prior Psychiatric Hospitalizations (last- [facility] 01/2024).  On this admission she presented to [facility] ED on 02/17/24  with a chief complaint of anxiety. In the ED, she was found to be acutely psychotic with tangential speech, agitation, and delusional thought content. Patient was admitted involuntarily to [facility] on 8/14. Since admission the patient has been notably unresponsive to antipsychotic medications, largely related to poor self-care appearing disheveled and requiring direction to do ADLs. Although initially this was concerning for lingering psychotic symptoms, several OT evaluations have noted significant cognitive deficits  and poor self care is now thought to be related to major neurocognitive disorder.   Interval events:  Patient has been accepted to group home with planned discharge to [facility] tomorrow. Per staff patient has been pleasant, attending some groups with minimal ability to engage. She is medication compliant. Did have witnessed fall last evening when walking out of the day room. Has been provided with wheeled walker to use and is on 1:1 for now. Vitals: BP (!) 151/79 (BP Location: Right Arm)   Pulse 83   Temp 97.8 F (36.6 C) (Oral)   Resp 17   Ht 5' 6 (1.676 m)   Wt 80.7 kg   SpO2 100%   BMI 28.71 kg/m    Interview today:  Today the patient states she is good. Does not remember anything about her fall. Denies feeling sick or tired, reporting mood as good. Denying all other concerns including SI, HI and AVH. Feeling happy to be leaving the hospital tomorrow.    Past Medical History:  Past Medical History:  Diagnosis Date   Anxiety    Arthritis    Asthma    Bipolar 1 disorder (HCC)    Diverticulosis    Dysrhythmia    sts I have heart palpitations   Fibromyalgia    H/O hiatal hernia    4   Headache(784.0)    High blood pressure    Meniere's disease    S/P colonoscopy    Dr. Golda 2010: few small diverticula at sigmoid. otherwise normal.    Shortness of breath     Past Surgical History:  Procedure Laterality Date   ABDOMINAL HYSTERECTOMY     APPENDECTOMY     CESAREAN SECTION     CHOLECYSTECTOMY     COLONOSCOPY N/A 12/25/2023   Procedure: COLONOSCOPY;  Surgeon: Eartha  Angelia Sieving, MD;  Location: [facility];  Service: Gastroenterology;  Laterality: N/A;  11:30am, asa 1   exploratory laparoscopy     FOOT SURGERY     HEMORRHOID SURGERY     LAPAROSCOPIC APPENDECTOMY  02/19/2011   Procedure: APPENDECTOMY LAPAROSCOPIC;  Surgeon: Oneil DELENA Budge;  Location: [facility] ORS;  Service: General;  Laterality: N/A;   LAPAROSCOPY  02/19/2011   Procedure: LAPAROSCOPY  DIAGNOSTIC;  Surgeon: Oneil DELENA Budge;  Location: [facility] ORS;  Service: General;  Laterality: N/A;   left ovarian removal     multiple hernia repairs     Right ovarian removal     TONSILLECTOMY     TONSILLECTOMY AND ADENOIDECTOMY     tubes in ears     UMBILICAL HERNIA REPAIR  Dec 2011   Dr. Budge   wisdom teeth removal     Family History:  Family History  Problem Relation Age of Onset   Thyroid  disease Mother    Colon cancer Father    Breast cancer Maternal Aunt    Colon cancer Brother    Colon cancer Other        paternal and maternal grandfather   Meniere's disease Other    Anesthesia problems Neg Hx    Hypotension Neg Hx    Malignant hyperthermia Neg Hx    Pseudochol deficiency Neg Hx    Family Psychiatric  History:  None Reported   Social History:  Social History   Substance and Sexual Activity  Alcohol Use No   Comment: not since June 2012     Social History   Substance and Sexual Activity  Drug Use Yes   Types: Marijuana   Comment: patient denies any-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    Social History   Socioeconomic History   Marital status: Legally Separated    Spouse name: Not on file   Number of children: Not on file   Years of education: Not on file   Highest education level: Not on file  Occupational History   Not on file  Tobacco Use   Smoking status: Every Day    Current packs/day: 0.00    Average packs/day: 0.5 packs/day for 37.3 years (18.7 ttl pk-yrs)    Types: Cigarettes    Start date: 07/07/1981    Last attempt to quit: 11/05/2018    Years since quitting: 5.5   Smokeless tobacco: Never  Vaping Use   Vaping status: Never Used  Substance and Sexual Activity   Alcohol use: No    Comment: not since June 2012   Drug use: Yes    Types: Marijuana    Comment: patient denies any-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   Sexual activity: Never    Birth control/protection: Surgical  Other Topics Concern   Not on file  Social History  Narrative   Not on file   Social Drivers of Health   Financial Resource Strain: Not on file  Food Insecurity: Food Insecurity Present (02/18/2024)   Hunger Vital Sign    Worried About Running Out of Food in the Last Year: Sometimes true    Ran Out of Food in the Last Year: Sometimes true  Transportation Needs: Unmet Transportation Needs (02/18/2024)   PRAPARE - Administrator, Civil Service (Medical): Yes    Lack of Transportation (Non-Medical): Yes  Physical Activity: Not on file  Stress: Not on file  Social Connections: Unknown (10/14/2021)   [facility]   Social Connections    Do your friends and family support you?: Not on file    What agencies support you?: Not on file    Current Medications: Current Facility-Administered Medications  Medication Dose Route Frequency Provider Last Rate Last Admin   acetaminophen  (TYLENOL ) tablet 650 mg  650 mg Oral Q6H PRN White, Patrice L, NP   650 mg at 05/04/24 1840   alum & mag hydroxide-simeth (MAALOX/MYLANTA) 200-200-20 MG/5ML suspension 30 mL  30 mL Oral Q4H PRN White, Patrice L, NP   30 mL at 04/25/24 2207   benztropine  (COGENTIN ) tablet 1 mg  1 mg Oral BID White, Patrice L, NP   1 mg at 05/07/24 9078   divalproex  (DEPAKOTE ) DR tablet 750 mg  750 mg Oral Q12H Ji, Andrew, MD   750 mg at 05/07/24 9078   haloperidol  (HALDOL ) tablet 10 mg  10 mg Oral Q12H Ji, Andrew, MD   10 mg at 05/07/24 9078   hydrOXYzine  (ATARAX ) tablet 25 mg  25 mg Oral TID PRN White, Patrice L, NP   25 mg at 05/06/24 2011   magnesium  hydroxide (MILK OF MAGNESIA) suspension 30 mL  30 mL Oral Daily PRN White, Patrice L, NP       nicotine  polacrilex (NICORETTE ) gum 2 mg  2 mg Oral PRN Trudy Carwin, NP   2 mg at 04/23/24 1827   OLANZapine  (ZYPREXA ) injection 10 mg  10 mg Intramuscular TID PRN White, Patrice L, NP       OLANZapine  (ZYPREXA ) injection 5 mg  5 mg Intramuscular TID PRN White, Patrice L, NP       OLANZapine  zydis  (ZYPREXA ) disintegrating tablet 5 mg  5 mg Oral TID PRN White, Patrice L, NP   5 mg at 04/18/24 0103   ondansetron  (ZOFRAN -ODT) disintegrating tablet 4 mg  4 mg Oral Q8H PRN Lynnette Barter, MD   4 mg at 05/05/24 1416   traZODone  (DESYREL ) tablet 50 mg  50 mg Oral QHS White, Patrice L, NP   50 mg at 05/06/24 2011   ziprasidone  (GEODON ) capsule 60 mg  60 mg Oral BID WC Marry Clamp, MD   60 mg at 05/07/24 0920    Lab Results:  No results found for this or any previous visit (from the past 48 hours).   Blood Alcohol level:  Lab Results  Component Value Date   [facility] <15 02/17/2024   ETH <15 01/14/2024    Metabolic Disorder Labs: Lab Results  Component Value Date   HGBA1C 5.1 01/18/2024   MPG 99.67 01/18/2024   MPG 105.41 05/11/2019   Lab Results  Component Value Date   PROLACTIN 24.1 (H) 05/11/2019   Lab Results  Component Value Date   CHOL 154 01/18/2024   TRIG 180 (H) 01/18/2024   HDL 49 01/18/2024   CHOLHDL 3.1 01/18/2024   VLDL 36 01/18/2024   LDLCALC 69 01/18/2024   LDLCALC 82 05/11/2019    Mental Status exam: Appearance: white female of slightly elevated BMI, fair hygiene, seen resting in bed  Eye contact: good  Attitude towards examiner  cooperative, pleasant Psychomotor: no agitation or retardation Speech: reduced amount, infrequent one-word responses  Language: simplistic  Mood: good Affect: congruent, euthymic, somnolent     Thought content: denying SI, HI, not overtly expressing delusional content Thought Process: remains confused and concrete, childlike  Perception: denying AVH, not overtly RTIS Insight: poor  Judgement: poor   Orientation: to self and hospital only - not  able to accurately report date, situation  Attention/Concentration: limited due to cognitive deficits   Memory/Cognition: poor - patient's most recent MOCA on 9/17 was an 8 with significant impairments in attention and memory.  Fund of Knowledge: below average      Musculoskeletal: Strength & Muscle Tone: within normal limits Gait & Station: normal Patient leans: N/A    Physical Exam Constitutional:      Appearance: the patient is not toxic-appearing.  Pulmonary:     Effort: Pulmonary effort is normal.  Neurological:     General: No focal deficit present.     Oriented to self and hospital only, does not verbalize date or situation HEENT:     Eyes EOMI   Review of Systems  Respiratory:  Negative for shortness of breath.   Cardiovascular:  Negative for chest pain.  Gastrointestinal:  Negative for abdominal pain, constipation, diarrhea, nausea and vomiting.  Neurological:  Negative for headaches.   Blood pressure (!) 151/79, pulse 83, temperature 97.8 F (36.6 C), temperature source Oral, resp. rate 17, height 5' 6 (1.676 m), weight 80.7 kg, SpO2 100%. Body mass index is 28.71 kg/m.   Treatment Plan Summary: Daily contact with patient to assess and evaluate symptoms and progress in treatment and Medication management  Assessment Allison Bolton is a 53 yr old female who presented on 02/17/24 to [facility] ED with a chief complaint of anxiety. In the ED, she was found to be acutely psychotic with tangential speech, agitation, and delusional thought content. Patient was admitted involuntarily to [facility] on 8/14.  Although substance-induced psychosis has previously been documented, she additional presents with prior reported manic episodes and a bipolar 1 diagnosis has been most reflective of this. On this admission, presented with agitation and increased rate of speech, initially had significantly poor sleep and psychotic symptoms, disorganization of thoughts, behavior, concern for AVH.  After approximately 1 week manic symptoms had largely resolved with antipsychotic medications and mood stabilizers however she remained with disorganization in thoughts and behavior and poor self care initially concerning for ongoing psychosis but after  extended observation appears more in line with underlying major neurocognitive disorder due to multiple etiologies. She has significant cognitive impairments with most recent MOCA scoring at 8 and recommendation for 24 hr supervision and help/prompting ADLs. She has been noted to be childlike in her interactions and requires direction to eat and shower but is otherwise pleasant and cooperative with care with no overt psychotic symptoms. Suspect multifactorial causes of cognitive impairment including longstanding substance use, recurrent manic/psychotic episodes, possible underlying low functioning (unknown).   Over the last 3-4 weeks the patient has been pleasant, denying AVH, somewhat more engaged in exam and with better eye contact. She has not been seen to be RTIS or overtly expressing delusional content. She remains psychiatrically stable. Presentation remains reflective of treated bipolar disorder and ongoing neurocognitive impairments. Patient has been accepted to group home and will be discharging tomorrow at 8 am. This dino is e-prescribing medications today to pharmacy utilized by [facility] - [facility] in [place] ([address]).     DSM-5 diagnoses: Bipolar 1 Disorder, most recent episode manic, severe, with psychotic features    -currently resolved: no active symptoms of mania or psychosis, just cognitive deficits 2.  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 History of substance induced psychotic disorder   Plan: Legal status: Invol/voluntary by guardian  Psychiatric medications:  -Continue Haldol  10 mg q12 for psychosis and mood stability -Continue  Geodon  60 mg bid -Continue Depakote  DR 750 mg q12 for mood stability -Continue Cogentin  1 mg BID for Drug Induced EPS  -Continue Agitation Protocol: Zyprexa    Nicotine  Dependence: -Continue Nicotine  Gum 2 mg PRN  Medical concerns: -UTI with U/A noted resistance to bactrim  -was on Cipro 500 mg BID x 10  days - completed    Additional PRNs -Continue Zofran  4 mg q8 PRN nausea/vomiting -Continue PRN's: Tylenol , Maalox, Atarax , Milk of Magnesia, Trazodone    --  The risks/benefits/side-effects/alternatives to medications were discussed in detail with the patient and time was given for questions. The patient consents to medication trials.                -- Metabolic profile and EKG monitoring obtained while on an atypical antipsychotic (BMI: 28.73 Lipid Panel: WNL except Trig: 180 HbgA1c: 5.1)              -- Encouraged patient to participate in unit milieu and in scheduled group therapies              -- Short Term Goals: Ability to identify changes in lifestyle to reduce recurrence of condition will improve, Ability to verbalize feelings will improve, Ability to disclose and discuss suicidal ideas, Ability to demonstrate self-control will improve, Ability to identify and develop effective coping behaviors will improve, Ability to maintain clinical measurements within normal limits will improve, Compliance with prescribed medications will improve, and Ability to identify triggers associated with substance abuse/mental health issues will improve             -- Long Term Goals: Improvement in symptoms so as ready for discharge   Safety and Monitoring:             -- Involuntary admission to inpatient psychiatric unit for safety, stabilization and treatment             -- Daily contact with patient to assess and evaluate symptoms and progress in treatment             -- Patient's case to be discussed in multi-disciplinary team meeting             -- Observation Level : q15 minute checks             -- Vital signs:  q12 hours             -- Precautions: suicide, elopement, and assault  Discharge Planning:             -appreciate SW assistance in discharging  [facility] SW now involved and will be getting the patient a guardian and assisting in finding placement. Level 2 filled out and guardianship hearing  was on 10/7 - she now has guardian.   -patient has been accepted to [facility] group home planned D/C 8 am on Sunday 05/08/24   Allison LOISE Arts, MD 05/07/2024, 12:04 PM

## 2024-05-07 NOTE — Progress Notes (Signed)
 Patient is in bed and staff is bedside. Patient eat her breakfast in her room and staff assisted her to bathroom then back to bed

## 2024-05-07 NOTE — Progress Notes (Signed)
 1:1 Nursing note:  On call provider notified due to concerns of pt falling. Order placed for pt to be on 1:1. Pt is currently lying in bed asleep. Chest observed for rise and fall. Safety checks continue q15.SABRA

## 2024-05-07 NOTE — Group Note (Deleted)
 Date:  05/07/2024 Time:  6:48 PM  Group Topic/Focus:  Emotional Wellness   The group session centered on anger management and coping mechanisms, beginning with a TED Talk that provided insights on shifting perspectives and effectively managing anger. After the video, the group engaged in a discussion about the anger cycle, exploring how anger develops and the emotional and physiological responses that accompany it. Patients also examined common triggers of anger and how to identify these triggers in their own lives. The group then focused on distinguishing between healthy and unhealthy coping mechanisms, with a particular emphasis on strategies that promote emotional regulation and positive outcomes.  Throughout the discussion, patients had the opportunity to collaborate by sharing personal experiences and brainstorming a variety of coping strategies. This collective sharing allowed participants to gain new insights and refine their own approaches to managing anger. The group dynamic fostered a supportive environment where patients could learn from each other's experiences, providing both validation and constructive feedback on coping techniques. The session aimed to enhance self-awareness, promote emotional resilience, and encourage the use of adaptive coping strategies in real-life situations.   Participation Level:  {BHH PARTICIPATION OZCZO:77735}  Participation Quality:  {BHH PARTICIPATION QUALITY:22265}  Affect:  {BHH AFFECT:22266}  Cognitive:  {BHH COGNITIVE:22267}  Insight: {BHH Insight2:20797}  Engagement in Group:  {BHH ENGAGEMENT IN HMNLE:77731}  Modes of Intervention:  {BHH MODES OF INTERVENTION:22269}  Additional Comments:  ***  Allison Bolton 05/07/2024, 6:48 PM

## 2024-05-08 DIAGNOSIS — F03918 Unspecified dementia, unspecified severity, with other behavioral disturbance: Secondary | ICD-10-CM

## 2024-05-08 MED ORDER — CIPROFLOXACIN HCL 500 MG PO TABS
500.0000 mg | ORAL_TABLET | Freq: Two times a day (BID) | ORAL | Status: DC
  Administered 2024-05-08: 500 mg via ORAL
  Filled 2024-05-08: qty 2

## 2024-05-08 MED ORDER — CIPROFLOXACIN HCL 500 MG PO TABS: 500.0000 mg | ORAL_TABLET | Freq: Two times a day (BID) | ORAL | 0 refills | Status: AC

## 2024-05-08 NOTE — Progress Notes (Signed)
 Pt discharged at this time. Pt removed all belongings. Pt denies SI/HI/AVH. Pt left facility with group home staff.

## 2024-05-08 NOTE — Plan of Care (Signed)
   Problem: Education: Goal: Knowledge of [facility] Education information/materials will improve Outcome: Progressing Goal: Emotional status will improve Outcome: Progressing Goal: Mental status will improve Outcome: Progressing Goal: Verbalization of understanding the information provided will improve Outcome: Progressing

## 2024-05-08 NOTE — BHH Suicide Risk Assessment (Signed)
 [facility] INPATIENT:  Family/Significant Other Suicide Prevention Education  Suicide Prevention Education:  Education Completed; [facility],  (name of family member/significant other) has been identified by the patient as the family member/significant other with whom the patient will be residing, and identified as the person(s) who will aid the patient in the event of a mental health crisis (suicidal ideations/suicide attempt).  With written consent from the patient, the family member/significant other has been provided the following suicide prevention education, prior to the and/or following the discharge of the patient.  The suicide prevention education provided includes the following: Suicide risk factors Suicide prevention and interventions National Suicide Hotline telephone number [facility] assessment telephone number [facility] Emergency Assistance 911 [facility] and/or Residential Mobile Crisis Unit telephone number  Request made of family/significant other to: Remove weapons (e.g., guns, rifles, knives), all items previously/currently identified as safety concern.   Remove drugs/medications (over-the-counter, prescriptions, illicit drugs), all items previously/currently identified as a safety concern.  The family member/significant other verbalizes understanding of the suicide prevention education information provided.  The family member/significant other agrees to remove the items of safety concern listed above.  Allison Bolton 05/08/2024, 8:55 AM

## 2024-05-08 NOTE — BHH Suicide Risk Assessment (Signed)
 [facility] Discharge Suicide Risk Assessment   Principal Problem: Bipolar affective disorder, current episode manic with psychotic symptoms [facility]) Discharge Diagnoses: Principal Problem:   Bipolar affective disorder, current episode manic with psychotic symptoms (HCC) Active Problems:   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)   Suicide Risk: The patient presented with acute risk factors for suicide including altered mental status/mania and recent substance use concerns. She has previously carried additional risk factors of history of mental illness and prior hospitalizations, unstable housing. Patient was admitted to inpatient psychiatry with medications started and full resolution of manic symptoms. She has been boarding on inpatient psychiatry due to neurocognitive deficits and inability to care for self, but has been psychiatrically stable for at least a month. Over the past month has consistently denied SI, has endorsed good mood and has presented with euthymic affect. No signs/symptoms of depression or SI during this period. She is help-seeking and medication compliant. Patient has also been accepted to group home, which will assist with medication administration and housing. Overall current and short term risk of suicide is deemed low.     Follow-up Information     [facility] Follow up on 05/09/2024.   Why: Please go to this provider on 05/08/24 at 9:00 am.   The nurse practitioner, Lenn Filippo will visit you in the group home for medication management services.  If you have any questions, please call [facility] [phone] . Contact information: [facility]  [address] [place], [place] [address]        Filippo Lenn, NP Follow up.   Specialty: Nurse Practitioner Contact information: [address] [place] [address] [phone]                  Leita LOISE Arts, MD 05/08/2024, 6:08 AM

## 2024-05-08 NOTE — Progress Notes (Signed)
  [facility] Adult Case Management Discharge Plan :  Will you be returning to the same living situation after discharge:  No. Patient will discharge to [facility] At discharge, do you have transportation home?: Yes,  Group Home staff will provide transportation to group home at discharge Do you have the ability to pay for your medications: Yes,  patient covered by insurance to cover medications  Release of information consent forms completed and in the chart;  Patient's signature needed at discharge.  Patient to Follow up at:  Follow-up Information     [facility] Follow up on 05/09/2024.   Why: Please go to this provider on 05/08/24 at 9:00 am.   The nurse practitioner, Lenn Filippo will visit you in the group home for medication management services.  If you have any questions, please call [facility] [phone] . Contact information: [facility]  [address] [place], [place] [address]        Filippo Lenn, NP Follow up.   Specialty: Nurse Practitioner Contact information: [address] [place] [address] [phone]                 Next level of care provider has access to [facility] Link:no  Safety Planning and Suicide Prevention discussed: Yes,  05/08/24 Amber Cosme-[facility]     Has patient been referred to the Quitline?: Patient refused referral for treatment  Patient has been referred for addiction treatment: Patient refused referral for treatment.  [address], [place], [place] 05/08/2024, 8:19 AM

## 2024-05-08 NOTE — Progress Notes (Signed)
   05/06/24 2046  What Happened  Was fall witnessed? Yes  Who witnessed fall? Aisha, Rheebe, Mia  Patients activity before fall ambulating-unassisted  Point of contact other (comment) (knee)  Was patient injured? Yes (hit her knee)  Patient found in hallway  Found by Staff-comment (witnessed)  Stated prior activity ambulating-unassisted  Provider Notification  Provider Name/Title Kathryne Dilling, NP  Date Provider Notified 05/06/24  Time Provider Notified 2108  Method of Notification Page (Secure chat)  Notification Reason Fall  Provider response Other (Comment) (Acknowledged)  Date of Provider Response 05/06/24  Time of Provider Response 2112  Follow Up  Family notified Yes - comment (no answer: Ileana)  Time family notified 2121  Additional tests Yes-comment  Simple treatment Other (comment) (Patient sent to the ED for med clearance)  Progress note created (see row info) Yes (sent to the ED for med clearance)  Adult Fall Risk Assessment  Risk Factor Category (scoring not indicated) High fall risk per protocol (document High fall risk);Fall has occurred during this admission (document High fall risk);History of more than one fall within 6 months before admission (document High fall risk)  Age 53  Fall History: Fall within 6 months prior to admission 0  Elimination; Bowel and/or Urine Incontinence 0  Elimination; Bowel and/or Urine Urgency/Frequency 0  Medications: includes PCA/Opiates, Anti-convulsants, Anti-hypertensives, Diuretics, Hypnotics, Laxatives, Sedatives, and Psychotropics 5  Patient Care Equipment 0  Mobility-Assistance 0  Mobility-Gait 2  Mobility-Sensory Deficit 0  Altered awareness of immediate physical environment 1  Impulsiveness 2  Lack of understanding of one's physical/cognitive limitations 4  Total Score 14  Patient Fall Risk Level High fall risk  Adult Fall Risk Interventions  Required Bundle Interventions *See Row Information* High fall risk   Additional Interventions Room near nurses station  Fall intervention(s) refused/Patient educated regarding refusal Nonskid socks;Open door if unsupervised;Supervision while toileting/edge of bed sitting  Screening for Fall Injury Risk (To be completed on HIGH fall risk patients) - Assessing Need for Floor Mats  Risk For Fall Injury- Criteria for Floor Mats Previous fall this admission;Confusion/dementia (+NuDESC, CIWA, TBI, etc.)  Vitals  BP 110/75  MAP (mmHg) 87  BP Method Automatic  Pulse Rate 66  Neurological  Neuro (WDL) X  Level of Consciousness Alert  Orientation Level Disoriented to situation  Cognition Appropriate at baseline  Musculoskeletal  Musculoskeletal (WDL) X  Assistive Device None  Generalized Weakness Yes  Musculoskeletal Details  Right Knee Swelling  Integumentary  Integumentary (WDL) X  Nurse assisting at admit/transfer - Full skin assessment Rheebe, Aisha  Skin Color Appropriate for ethnicity  Pain Assessment  Work-Related Injury No

## 2024-05-08 NOTE — Discharge Summary (Addendum)
 Physician Discharge Summary Note  Patient:  Allison Bolton is an 53 y.o., female MRN:  [id number] DOB:  Jul 25, 1970 Patient phone:  [phone] (home)  Patient address:   [address] 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 Stay was extended due to inability to care for self 2/2 neurocognitive deficits and search for placement.  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 She does have a history of prior psychiatric hospitalizations with substance use driving psychotic symptoms. On this admission presented with clear manic and psychotic symptoms as noted in hospital course below and considered most reflective of a primary bipolar disorder. Additionally, she has clear major neurocognitive disorder suspected due to multiple etiologies with poor executive functioning, poor memory (most recent MOCA 9), requiring direction to do all ADLs and (per OT evaluation) requiring 24/7 supervision. Cognitive symptoms remained even once manic symptoms had fully resolved.  She has one prior suicide Attempt (2007), and Prior Psychiatric Hospitalizations (last- [facility] 01/2024).   Hospital Course: The patient initially presented on 02/17/24 to [facility] ED with a chief  complaint of anxiety. In the ED, she was found to be acutely psychotic with tangential speech, agitation, and delusional thought content. Patient was admitted involuntarily to [facility] on 8/14.   Upon arrival patient demonstrated clear manic and psychotic symptoms: agitation and increased rate of speech, initially had significantly poor sleep and psychotic symptoms, disorganization of thoughts, behavior, concern for AVH.  She under went a number of medication changes to address these acute concerns in the first 2 weeks of admission, including olanzapine , haldol , geodon , and depakote . Manic/psychotic symptoms ultimately resolved with the following regimen that she was discharged on:  -Haldol  10 mg q12 for psychosis and mood stability -Geodon  60 mg bid -Depakote  DR 750 mg q12 for mood stability -Cogentin  1 mg BID for Drug Induced EPS  After approximately 1-2 weeks, manic symptoms had largely resolved with antipsychotic medications and mood stabilizers. However she remained with disorganization in thoughts and behavior and poor self care initially concerning for possibility of negative symptoms of psychosis. ECT was initially pursued. However, after extended observation, OT evaluations, and repeat MOCA's the patient symptoms appeared more in line with underlying major neurocognitive disorder. Guardianship was pursued with DSS SW assisting in this process and ultimately obtaining a guardian. For at least one month prior to discharge the patient was considered psychiatrically stable with no significant medication changes made. She remained admitted due to inability to care for self and requiring group home placement. Ultimately, [facility] accepted the patient and she was discharged 05/08/2024  Note - on day of discharge patient presented with signs/symptoms of UTI. Urinalysis ordered and 10-day course of ciprofloxacin was sent to her new pharmacy (on this admission patient had another UTI  resistant to bactrim  and treated successfully with cipro).  Interview today: Today the patient states  she is good! She is excited to leave the hospital and looking forward to her new home. She reports good sleep last night, denies pain, denies SI, HI or AVH. When specifically asked, does note that she has been going to the bathroom to pee more frequently. Otherwise denies all concerns.    Behavior on unit/overall: The patient initially required numerous PRNs and several medication changes as noted above due to florid manic/psychotic symptoms. Once these had resolved (within 1-2 weeks), she was largely pleasant and compliant with all aspect of care including taking medications and easily redirectable. She ate and slept well and did not have any behavioral concerns throughout the last month and a half of admission. Her stay was complicated by 2 UTIs (treated with antibiotics) and 2 falls, workup negative and noted to be ambulating stably after this. Once psychiatrically stable the patient was pleasant but continued to demonstrate baseline neurocognitive impairments requiring direction to complete all ADLs including eating and showering. Throughout entirety of admission the patient denied SI and over the last month presented as cheerful/euthymic in affect and denied all concerns. Ultimately considered low risk of suicide (see SRA) and discharged to [facility] in stable and improved condition. All medications were sent to the pharmacy used by [facility], including recent prescription for ciprofloxacin. Patient has intake appointment upon arrival for ongoing care.    Past Medical History:  Past Medical History:  Diagnosis Date   Anxiety    Arthritis    Asthma    Bipolar 1 disorder (HCC)    Diverticulosis    Dysrhythmia    sts I have heart palpitations   Fibromyalgia    H/O hiatal hernia    4   Headache(784.0)    High blood pressure    Meniere's disease     S/P colonoscopy    Dr. Golda 2010: few small diverticula at sigmoid. otherwise normal.    Shortness of breath     Past Surgical History:  Procedure Laterality Date   ABDOMINAL HYSTERECTOMY     APPENDECTOMY     CESAREAN SECTION     CHOLECYSTECTOMY     COLONOSCOPY N/A 12/25/2023   Procedure: COLONOSCOPY;  Surgeon: Eartha Angelia Sieving, MD;  Location: [facility];  Service: Gastroenterology;  Laterality: N/A;  11:30am, asa 1   exploratory laparoscopy     FOOT SURGERY     HEMORRHOID SURGERY     LAPAROSCOPIC APPENDECTOMY  02/19/2011   Procedure: APPENDECTOMY LAPAROSCOPIC;  Surgeon: Oneil DELENA Budge;  Location: [facility] OR[facility];  Service: General;  Laterality: N/A;   LAPAROSCOPY  02/19/2011   Procedure: LAPAROSCOPY DIAGNOSTIC;  Surgeon: Oneil DELENA Budge;  Location: [facility];  Service: General;  Laterality: N/A;   left ovarian removal     multiple hernia repairs     Right ovarian removal     TONSILLECTOMY     TONSILLECTOMY AND ADENOIDECTOMY     tubes in ears     UMBILICAL HERNIA REPAIR  Dec 2011   Dr. Budge   wisdom teeth removal     Family History:  Family History  Problem Relation Age of Onset   Thyroid  disease Mother    Colon cancer Father    Breast cancer Maternal Aunt    Colon cancer Brother    Colon cancer Other        paternal and maternal grandfather   Meniere's disease Other    Anesthesia problems Neg Hx    Hypotension Neg Hx  Malignant hyperthermia Neg Hx    Pseudochol deficiency Neg Hx    Social History:  Social History   Substance and Sexual Activity  Alcohol Use No   Comment: not since June 2012     Social History   Substance and Sexual Activity  Drug Use Yes   Types: Marijuana   Comment: patient denies any-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    Social History   Socioeconomic History   Marital status: Legally Separated    Spouse name: Not on file   Number of children: Not on file   Years of education: Not on file   Highest education level: Not on  file  Occupational History   Not on file  Tobacco Use   Smoking status: Every Day    Current packs/day: 0.00    Average packs/day: 0.5 packs/day for 37.3 years (18.7 ttl pk-yrs)    Types: Cigarettes    Start date: 07/07/1981    Last attempt to quit: 11/05/2018    Years since quitting: 5.5   Smokeless tobacco: Never  Vaping Use   Vaping status: Never Used  Substance and Sexual Activity   Alcohol use: No    Comment: not since June 2012   Drug use: Yes    Types: Marijuana    Comment: patient denies any-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   Sexual activity: Never    Birth control/protection: Surgical  Other Topics Concern   Not on file  Social History Narrative   Not on file   Social Drivers of Health   Financial Resource Strain: Not on file  Food Insecurity: Patient Unable To Answer (05/07/2024)   Hunger Vital Sign    Worried About Running Out of Food in the Last Year: Patient unable to answer    Ran Out of Food in the Last Year: Patient unable to answer  Recent Concern: Food Insecurity - Food Insecurity Present (02/18/2024)   Hunger Vital Sign    Worried About Running Out of Food in the Last Year: Sometimes true    Ran Out of Food in the Last Year: Sometimes true  Transportation Needs: Patient Unable To Answer (05/07/2024)   PRAPARE - Transportation    Lack of Transportation (Medical): Patient unable to answer    Lack of Transportation (Non-Medical): Patient unable to answer  Recent Concern: Transportation Needs - Unmet Transportation Needs (02/18/2024)   PRAPARE - Administrator, Civil Service (Medical): Yes    Lack of Transportation (Non-Medical): Yes  Physical Activity: Not on file  Stress: Not on file  Social Connections: Unknown (10/14/2021)   Received from [facility]   Social Connections    Do your friends and family support you?: Not on file    What agencies support you?: Not on file   Physical findings:  Mental Status  exam: Appearance: white female of slightly elevated BMI, fair hygiene, seen putting items away in brown bags, standing steadily  Eye contact: good  Attitude towards examiner  cooperative, pleasant Psychomotor: no agitation or retardation Speech: reduced amount, infrequent one-word responses  Language: simplistic  Mood: good Affect: congruent, euthymic,  Thought content: denying SI, HI, not expressing delusional content Thought Process: remains confused and concrete, childlike  Perception: denying AVH, not overtly RTIS Insight: poor  Judgement: poor    Orientation: to self and hospital only - not able to accurately report date, situation  Attention/Concentration: limited due to cognitive deficits   Memory/Cognition: poor -  patient's most recent MOCA on 9/17 was an 8 with significant impairments in attention and memory.  Fund of Knowledge: below average       Musculoskeletal: Strength & Muscle Tone: within normal limits Gait & Station: normal Patient leans: N/A   Physical Exam Constitutional:      General: She is not in acute distress.    Appearance: Normal appearance.  HENT:     Head: Normocephalic and atraumatic.  Abdominal:     General: There is no distension.  Musculoskeletal:        General: Normal range of motion.  Neurological:     General: No focal deficit present.     Mental Status: She is alert.    Review of Systems  All other systems reviewed and are negative.  Blood pressure 127/86, pulse 92, temperature 97.8 F (36.6 C), temperature source Oral, resp. rate 17, height 5' 6 (1.676 m), weight 80.7 kg, SpO2 100%. Body mass index is 28.71 kg/m.   Social History   Tobacco Use  Smoking Status Every Day   Current packs/day: 0.00   Average packs/day: 0.5 packs/day for 37.3 years (18.7 ttl pk-yrs)   Types: Cigarettes   Start date: 07/07/1981   Last attempt to quit: 11/05/2018   Years since quitting: 5.5  Smokeless Tobacco Never   Tobacco Cessation:  N/A,  patient does not currently use tobacco products   Blood Alcohol level:  Lab Results  Component Value Date   [facility] <15 02/17/2024   ETH <15 01/14/2024    Metabolic Disorder Labs:  Lab Results  Component Value Date   HGBA1C 5.1 01/18/2024   MPG 99.67 01/18/2024   MPG 105.41 05/11/2019   Lab Results  Component Value Date   PROLACTIN 24.1 (H) 05/11/2019   Lab Results  Component Value Date   CHOL 154 01/18/2024   TRIG 180 (H) 01/18/2024   HDL 49 01/18/2024   CHOLHDL 3.1 01/18/2024   VLDL 36 01/18/2024   LDLCALC 69 01/18/2024   LDLCALC 82 05/11/2019    See Psychiatric Specialty Exam and Suicide Risk Assessment completed by Attending Physician prior to discharge.  Discharge destination:  [facility]   Is patient on multiple antipsychotic therapies at discharge:  Yes,   Do you recommend tapering to monotherapy for antipsychotics?  No   Has Patient had three or more failed trials of antipsychotic monotherapy by history:  Yes,   Antipsychotic medications that previously failed include:   1.  prolixin ., 2.  Olanzapine ., and 3.   .  Recommended Plan for Multiple Antipsychotic Therapies: Continue at this time - can consider tapering one of her medications slowly per discretion of outpatient psychiatrist   Discharge Instructions     Increase activity slowly   Complete by: As directed       Allergies as of 05/08/2024       Reactions   Amoxicillin Dermatitis, Rash, Other (See Comments)   Patient states she feels sunburnt when taking amoxicillin   Tramadol Dermatitis, Rash   Amoxicillin-pot Clavulanate Dermatitis   Sunburn-like painful rash        Medication List     STOP taking these medications    fluPHENAZine  5 MG tablet Commonly known as: PROLIXIN    hydrOXYzine  25 MG tablet Commonly known as: ATARAX    nicotine  14 mg/24hr patch Commonly known as: NICODERM CQ  - dosed in mg/24 hours   ondansetron  4 MG disintegrating tablet Commonly known as:  ZOFRAN -ODT       TAKE these  medications      Indication  albuterol  108 (90 Base) MCG/ACT inhaler Commonly known as: VENTOLIN  HFA Inhale 2 puffs into the lungs every 4 (four) hours as needed for wheezing or shortness of breath.  Indication: Spasm of Lung Air Passages   benztropine  1 MG tablet Commonly known as: COGENTIN  Take 1 tablet (1 mg total) by mouth 2 (two) times daily.  Indication: Extrapyramidal Reaction caused by Medications   ciprofloxacin 500 MG tablet Commonly known as: CIPRO Take 1 tablet (500 mg total) by mouth 2 (two) times daily for 10 days.  Indication: Urinary Tract Infection   divalproex  250 MG DR tablet Commonly known as: DEPAKOTE  Take 3 tablets (750 mg total) by mouth every 12 (twelve) hours.  Indication: Manic Phase of Manic-Depression   haloperidol  10 MG tablet Commonly known as: HALDOL  Take 1 tablet (10 mg total) by mouth every 12 (twelve) hours.  Indication: Manic Phase of Manic-Depression   traZODone  50 MG tablet Commonly known as: DESYREL  Take 1 tablet (50 mg total) by mouth at bedtime as needed for sleep.  Indication: Trouble Sleeping   ziprasidone  60 MG capsule Commonly known as: GEODON  Take 1 capsule (60 mg total) by mouth 2 (two) times daily with a meal.  Indication: Manic-Depression        Follow-up Information     [facility] Follow up on 05/09/2024.   Why: Please go to this provider on 05/08/24 at 9:00 am.   The nurse practitioner, Lenn Filippo will visit you in the group home for medication management services.  If you have any questions, please call [facility] [phone] . Contact information: [facility]  [address] [place], [place] [address]        Filippo Lenn, NP Follow up.   Specialty: Nurse Practitioner Contact information: [address] [place] [address] [phone]                  Signed: Leita LOISE Arts, MD 05/08/2024, 9:13 AM

## 2024-05-08 NOTE — BH Assessment (Signed)
(  Sleep Hours) - 8.75 (Any PRNs that were needed, meds refused, or side effects to meds)-  (Any disturbances and when (visitation, over night)- None (Concerns raised by the patient)- None (SI/HI/AVH)- Not able to respond. Noted confusion

## 2024-05-08 NOTE — BH Assessment (Signed)
(  Sleep Hours) - (Any PRNs that were needed, meds refused, or side effects to meds)-  (Any disturbances and when (visitation, over night)- None (Concerns raised by the patient)-  None (SI/HI/AVH)- Denies with noted confusion

## 2024-05-25 ENCOUNTER — Other Ambulatory Visit: Payer: Self-pay

## 2024-05-25 ENCOUNTER — Emergency Department (HOSPITAL_BASED_OUTPATIENT_CLINIC_OR_DEPARTMENT_OTHER)

## 2024-05-25 ENCOUNTER — Encounter (HOSPITAL_BASED_OUTPATIENT_CLINIC_OR_DEPARTMENT_OTHER): Payer: Self-pay | Admitting: Emergency Medicine

## 2024-05-25 ENCOUNTER — Observation Stay (HOSPITAL_BASED_OUTPATIENT_CLINIC_OR_DEPARTMENT_OTHER): Admission: EM | Admit: 2024-05-25 | Discharge: 2024-05-29 | Disposition: A | Discharge status: Home / Self Care

## 2024-05-25 DIAGNOSIS — M1380 Other specified arthritis, unspecified site: Secondary | ICD-10-CM | POA: Insufficient documentation

## 2024-05-25 DIAGNOSIS — S32020A Wedge compression fracture of second lumbar vertebra, initial encounter for closed fracture: Secondary | ICD-10-CM | POA: Diagnosis not present

## 2024-05-25 DIAGNOSIS — I499 Cardiac arrhythmia, unspecified: Secondary | ICD-10-CM | POA: Diagnosis not present

## 2024-05-25 DIAGNOSIS — R519 Headache, unspecified: Secondary | ICD-10-CM | POA: Insufficient documentation

## 2024-05-25 DIAGNOSIS — H8109 Meniere's disease, unspecified ear: Secondary | ICD-10-CM | POA: Insufficient documentation

## 2024-05-25 DIAGNOSIS — R296 Repeated falls: Principal | ICD-10-CM | POA: Diagnosis present

## 2024-05-25 DIAGNOSIS — F17210 Nicotine dependence, cigarettes, uncomplicated: Secondary | ICD-10-CM | POA: Diagnosis not present

## 2024-05-25 DIAGNOSIS — Z9181 History of falling: Secondary | ICD-10-CM | POA: Insufficient documentation

## 2024-05-25 DIAGNOSIS — J45909 Unspecified asthma, uncomplicated: Secondary | ICD-10-CM | POA: Insufficient documentation

## 2024-05-25 DIAGNOSIS — I10 Essential (primary) hypertension: Secondary | ICD-10-CM | POA: Insufficient documentation

## 2024-05-25 DIAGNOSIS — M797 Fibromyalgia: Secondary | ICD-10-CM | POA: Insufficient documentation

## 2024-05-25 DIAGNOSIS — Z8543 Personal history of malignant neoplasm of ovary: Secondary | ICD-10-CM | POA: Diagnosis not present

## 2024-05-25 DIAGNOSIS — F1220 Cannabis dependence, uncomplicated: Secondary | ICD-10-CM | POA: Diagnosis not present

## 2024-05-25 DIAGNOSIS — F419 Anxiety disorder, unspecified: Secondary | ICD-10-CM | POA: Insufficient documentation

## 2024-05-25 DIAGNOSIS — R410 Disorientation, unspecified: Secondary | ICD-10-CM

## 2024-05-25 DIAGNOSIS — E119 Type 2 diabetes mellitus without complications: Secondary | ICD-10-CM | POA: Diagnosis not present

## 2024-05-25 DIAGNOSIS — K5730 Diverticulosis of large intestine without perforation or abscess without bleeding: Secondary | ICD-10-CM | POA: Insufficient documentation

## 2024-05-25 DIAGNOSIS — W19XXXA Unspecified fall, initial encounter: Secondary | ICD-10-CM | POA: Insufficient documentation

## 2024-05-25 DIAGNOSIS — F312 Bipolar disorder, current episode manic severe with psychotic features: Principal | ICD-10-CM | POA: Diagnosis present

## 2024-05-25 DIAGNOSIS — F0280 Dementia in other diseases classified elsewhere without behavioral disturbance: Secondary | ICD-10-CM | POA: Diagnosis present

## 2024-05-25 DIAGNOSIS — M4850XA Collapsed vertebra, not elsewhere classified, site unspecified, initial encounter for fracture: Secondary | ICD-10-CM

## 2024-05-25 LAB — URINALYSIS, ROUTINE W REFLEX MICROSCOPIC
Bacteria, UA: NONE SEEN
Bilirubin Urine: NEGATIVE
Glucose, UA: NEGATIVE mg/dL
Hgb urine dipstick: NEGATIVE
Ketones, ur: 15 mg/dL — AB
Leukocytes,Ua: NEGATIVE
Nitrite: NEGATIVE
Specific Gravity, Urine: 1.027 (ref 1.005–1.030)
pH: 6 (ref 5.0–8.0)

## 2024-05-25 LAB — CBC WITH DIFFERENTIAL/PLATELET
Abs Immature Granulocytes: 0.01 K/uL (ref 0.00–0.07)
Basophils Absolute: 0.1 K/uL (ref 0.0–0.1)
Basophils Relative: 1 %
Eosinophils Absolute: 0.2 K/uL (ref 0.0–0.5)
Eosinophils Relative: 3 %
HCT: 38.5 % (ref 36.0–46.0)
Hemoglobin: 13.1 g/dL (ref 12.0–15.0)
Immature Granulocytes: 0 %
Lymphocytes Relative: 41 %
Lymphs Abs: 3 K/uL (ref 0.7–4.0)
MCH: 30 pg (ref 26.0–34.0)
MCHC: 34 g/dL (ref 30.0–36.0)
MCV: 88.3 fL (ref 80.0–100.0)
Monocytes Absolute: 0.7 K/uL (ref 0.1–1.0)
Monocytes Relative: 9 %
Neutro Abs: 3.4 K/uL (ref 1.7–7.7)
Neutrophils Relative %: 46 %
Platelets: 198 K/uL (ref 150–400)
RBC: 4.36 MIL/uL (ref 3.87–5.11)
RDW: 15.9 % — ABNORMAL HIGH (ref 11.5–15.5)
WBC: 7.4 K/uL (ref 4.0–10.5)
nRBC: 0 % (ref 0.0–0.2)

## 2024-05-25 LAB — COMPREHENSIVE METABOLIC PANEL WITH GFR
ALT: 9 U/L (ref 0–44)
AST: 15 U/L (ref 15–41)
Albumin: 4 g/dL (ref 3.5–5.0)
Alkaline Phosphatase: 42 U/L (ref 38–126)
Anion gap: 10 (ref 5–15)
BUN: 15 mg/dL (ref 6–20)
CO2: 26 mmol/L (ref 22–32)
Calcium: 9.6 mg/dL (ref 8.9–10.3)
Chloride: 108 mmol/L (ref 98–111)
Creatinine, Ser: 0.64 mg/dL (ref 0.44–1.00)
GFR, Estimated: >60 mL/min (ref >60)
Glucose, Bld: 103 mg/dL — ABNORMAL HIGH (ref 70–99)
Potassium: 3.6 mmol/L (ref 3.5–5.1)
Sodium: 144 mmol/L (ref 135–145)
Total Bilirubin: 0.5 mg/dL (ref 0.0–1.2)
Total Protein: 6.4 g/dL — ABNORMAL LOW (ref 6.5–8.1)

## 2024-05-25 LAB — LIPASE, BLOOD: Lipase: 12 U/L (ref 11–51)

## 2024-05-25 LAB — VALPROIC ACID LEVEL: Valproic Acid Lvl: 107 ug/mL — ABNORMAL HIGH (ref 50–100)

## 2024-05-25 MED ORDER — HALOPERIDOL 5 MG PO TABS
10.0000 mg | ORAL_TABLET | Freq: Once | ORAL | Status: AC
  Administered 2024-05-25: 10 mg via ORAL
  Filled 2024-05-25: qty 2

## 2024-05-25 MED ORDER — LORAZEPAM 2 MG/ML IJ SOLN
1.0000 mg | Freq: Once | INTRAMUSCULAR | Status: AC
  Administered 2024-05-25: 1 mg via INTRAVENOUS
  Filled 2024-05-25: qty 1

## 2024-05-25 NOTE — ED Notes (Signed)
 Fall pad alarm placed, fall band and socks in place. Door open.

## 2024-05-25 NOTE — ED Provider Notes (Signed)
 Allison Bolton   CSN: [id number] Arrival date & time: 05/25/24  1515     Patient presents with: Weakness   Allison Bolton is a 53 y.o. female.    Weakness  Patient presents because of weakness.  Patient's caretaker at bedside.  Patient caretaker states that patient is currently at neurobaseline.  Usually only responding to herself and talk to herself.  Not really able to hold meaningful conversation.  Patient is usually able to walk around.  Over the past day or 2, has had multiple falls.  She feels like the patient's legs incessantly gave out from underneath her.  Not aware of any kind of head strikes.  No loss of consciousness.  Patient signed a plan about any kind of pain.  Also having more difficult time feeding herself.  Usually able to eat really fast and mostly able to get the food from hand to mouth but having more difficulty here recently.  No changes in medications since she was last discharged from the hospital.  No documented fevers.  No bowel bladder incontinence.   Previous medical history reviewed : Patient recently admitted in the setting of bipolar affective disorder.  Episode manic with psychotic symptoms.  Subsequently discharged on Haldol , Geodon , Depakote  as well as Cogentin .  At time of discharge, concern for possible UTI.  Subsequent started patient on ciprofloxacin.  Patient admitted from August 14 to November 2.  During admission, patient continued to demonstrate baseline neurocognitive impairments required direction to complete all ADLs including eating and showering.      Prior to Admission medications   Medication Sig Start Date End Date Taking? Authorizing Provider  albuterol  (VENTOLIN  HFA) 108 (90 Base) MCG/ACT inhaler Inhale 2 puffs into the lungs every 4 (four) hours as needed for wheezing or shortness of breath. 05/07/24 06/06/24  Towana Leita SAILOR, MD  benztropine  (COGENTIN ) 1 MG tablet Take 1 tablet  (1 mg total) by mouth 2 (two) times daily. 05/07/24 06/06/24  Towana Leita SAILOR, MD  divalproex  (DEPAKOTE ) 250 MG DR tablet Take 3 tablets (750 mg total) by mouth every 12 (twelve) hours. 05/07/24 06/06/24  Towana Leita SAILOR, MD  haloperidol  (HALDOL ) 10 MG tablet Take 1 tablet (10 mg total) by mouth every 12 (twelve) hours. 05/07/24 06/06/24  Towana Leita SAILOR, MD  traZODone  (DESYREL ) 50 MG tablet Take 1 tablet (50 mg total) by mouth at bedtime as needed for sleep. 05/07/24 06/06/24  Towana Leita SAILOR, MD  ziprasidone  (GEODON ) 60 MG capsule Take 1 capsule (60 mg total) by mouth 2 (two) times daily with a meal. 05/07/24 06/06/24  Towana Leita SAILOR, MD  estrogens , conjugated, (PREMARIN ) 0.625 MG tablet Take 0.625 mg by mouth daily.   09/11/11  [provider]    Allergies: Amoxicillin, Tramadol, and Amoxicillin-pot clavulanate    Review of Systems  Neurological:  Positive for weakness.    Updated Vital Signs BP (!) 110/53 (BP Location: Left Arm)   Pulse 82   Temp 97.9 F (36.6 C)   Resp 16   Wt 83.9 kg   SpO2 96%   BMI 29.86 kg/m   Physical Exam Vitals and nursing Bolton reviewed.  Constitutional:      General: She is not in acute distress.    Appearance: She is well-developed.  HENT:     Head: Normocephalic and atraumatic.  Eyes:     Conjunctiva/sclera: Conjunctivae normal.  Cardiovascular:     Rate and Rhythm: Normal rate and regular  rhythm.     Heart sounds: No murmur heard. Pulmonary:     Effort: Pulmonary effort is normal. No respiratory distress.     Breath sounds: Normal breath sounds.  Abdominal:     Palpations: Abdomen is soft.     Tenderness: There is no abdominal tenderness.  Musculoskeletal:        General: No swelling.     Cervical back: Neck supple.  Skin:    General: Skin is warm and dry.     Capillary Refill: Capillary refill takes less than 2 seconds.  Neurological:     Mental Status: She is alert.  Psychiatric:        Mood and Affect: Mood normal.     (all  labs ordered are listed, but only abnormal results are displayed) Labs Reviewed  CBC WITH DIFFERENTIAL/PLATELET - Abnormal; Notable for the following components:      Result Value   RDW 15.9 (*)    All other components within normal limits  COMPREHENSIVE METABOLIC PANEL WITH GFR - Abnormal; Notable for the following components:   Glucose, Bld 103 (*)    Total Protein 6.4 (*)    All other components within normal limits  URINALYSIS, ROUTINE W REFLEX MICROSCOPIC - Abnormal; Notable for the following components:   Ketones, ur 15 (*)    Protein, ur TRACE (*)    All other components within normal limits  LIPASE, BLOOD  VALPROIC  ACID LEVEL  AMMONIA    EKG: EKG Interpretation Date/Time:  Wednesday May 25 2024 16:37:18 EST Ventricular Rate:  67 PR Interval:  148 QRS Duration:  87 QT Interval:  415 QTC Calculation: 439 R Axis:   64  Text Interpretation: Sinus rhythm Confirmed by Simon Rea [phone]) on 05/25/2024 5:49:07 PM  Radiology: CT Cervical Spine Wo Contrast Result Date: 05/25/2024 EXAM: CT CERVICAL SPINE WITHOUT CONTRAST 05/25/2024 05:43:46 PM TECHNIQUE: CT of the cervical spine was performed without the administration of intravenous contrast. Multiplanar reformatted images are provided for review. Automated exposure control, iterative reconstruction, and/or weight based adjustment of the mA/kV was utilized to reduce the radiation dose to as low as reasonably achievable. COMPARISON: CT cervical spine 05/03/2019. CLINICAL HISTORY: Polytrauma, blunt. FINDINGS: LIMITATIONS/ARTIFACTS: Examination is slightly limited by motion. CERVICAL SPINE: BONES AND ALIGNMENT: Straightening and slight reversal of the normal cervical lordosis. No evidence of traumatic malalignment. Cervical vertebral body heights are maintained. Irregularity of the T2 superior endplate with up to 20% height loss centrally. This finding was not present on the studies from 2020 and is age indeterminate. Recommend  correlation with tenderness at this level and consider MRI of the thoracic spine for further evaluation if clinically indicated. DEGENERATIVE CHANGES: No significant degenerative changes. SOFT TISSUES: No prevertebral soft tissue swelling. IMPRESSION: 1. No acute abnormality of the cervical spine. 2. Irregularity and up to 20% central height loss of the T2 superior endplate, age indeterminate. Recommend correlation with tenderness as this level and consider thoracic spine MRI for further evaluation if clinically warranted. Electronically signed by: Donnice Mania MD 05/25/2024 05:58 PM EST RP Workstation: [id number]   CT Head Wo Contrast Result Date: 05/25/2024 EXAM: CT HEAD WITHOUT CONTRAST 05/25/2024 05:43:46 PM TECHNIQUE: CT of the head was performed without the administration of intravenous contrast. Automated exposure control, iterative reconstruction, and/or weight based adjustment of the mA/kV was utilized to reduce the radiation dose to as low as reasonably achievable. COMPARISON: CT head 05/03/2019. CLINICAL HISTORY: multiple falls. eval for subdural/epidura FINDINGS: BRAIN AND VENTRICLES: No acute hemorrhage. No  evidence of acute infarct. No hydrocephalus. No extra-axial collection. No mass effect or midline shift. ORBITS: No acute abnormality. SINUSES: No acute abnormality. SOFT TISSUES AND SKULL: No acute soft tissue abnormality. No skull fracture. IMPRESSION: 1. No acute intracranial abnormality. Electronically signed by: Donnice Mania MD 05/25/2024 05:52 PM EST RP Workstation: [id number]     Procedures   Medications Ordered in the ED  LORazepam  (ATIVAN ) injection 1 mg (1 mg Intravenous Given 05/25/24 1924)                                    Medical Decision Making Amount and/or Complexity of Data Reviewed Labs: ordered. Radiology: ordered.  Risk Prescription drug management. Decision regarding hospitalization.     HPI:  Patient presents because of weakness.  Patient's  caretaker at bedside.  Patient caretaker states that patient is currently at neurobaseline.  Usually only responding to herself and talk to herself.  Not really able to hold meaningful conversation.  Patient is usually able to walk around.  Over the past day or 2, has had multiple falls.  She feels like the patient's legs incessantly gave out from underneath her.  Not aware of any kind of head strikes.  No loss of consciousness.  Patient signed a plan about any kind of pain.  Also having more difficult time feeding herself.  Usually able to eat really fast and mostly able to get the food from hand to mouth but having more difficulty here recently.  No changes in medications since she was last discharged from the hospital.  No documented fevers.  No bowel bladder incontinence.   Previous medical history reviewed : Patient recently admitted in the setting of bipolar affective disorder.  Episode manic with psychotic symptoms.  Subsequently discharged on Haldol , Geodon , Depakote  as well as Cogentin .  At time of discharge, concern for possible UTI.  Subsequent started patient on ciprofloxacin.  Patient admitted from August 14 to November 2.  During admission, patient continued to demonstrate baseline neurocognitive impairments required direction to complete all ADLs including eating and showering.  MDM:   Upon exam, patient able to tell me her name.  Otherwise unable to tell me her location or current date.  This is baseline for patient according to patient's caretaker at bedside.  Most of my history is obtained via caretaker.  Patient POA is the state.   Patient moving all extremities.  Rolling around in bed at times.  No obvious focal deficits I can appreciate.  Able to lift up both legs against gravity.  Lifting both arms up against gravity.  Cranial nerves II to XII appear to be intact.   CT head as well as CT C-spine to rule out any kind of traumatic injury.  Rule out subdural epidural.  No obvious pain or  bruising to patient's thoracic or lumbar area.  No bowel or bladder incontinence.  I think she is low risk for any kind of cauda equina given that she is moving extremities very well without any kind of red flag symptoms.   Will obtain laboratory workup and obtain well.  Benign abdomen.  No rebound guarding or tenderness.  No concerns for intra-abdominal pathology   Reevaluation:   Upon reexamination, patient hemodynamically stable.  She is still rolling around in bed.  Trying to sit up at times.  Redirectable.  .  Reviewed CT scan.  Superior endplate fracture of the T2 area.  Unclear chronicity of this.  Unable to really get a good exam on the patient given her mental status given her psychiatric disease.  When pressing the area, she states that it hurts but she also states that hurts when pressing all over her body so it is hard to really discern whether or not this is true pain.  Otherwise, cannot appreciate an obvious neurosensory deficits on my exam but once again, this is difficult.  Will add on CT thoracic and lumbar spine as well.  May benefit from MRI if continues to have these abnormal falls.  In terms of the falls.  Patient manage cardiac telemetry.  Sinus rhythm.  No STEMI arrhythmia.  Question whether or not this could be more psychiatric driven versus polypharmacy given her high dose antipsychotics.  Will add on Depakote  level as well as ammonia.  Will measure post void to make sure patient is not retaining to help further rule out central cause of falls.   Patient will be placed in observation inpatient for further workup and monitoring.   Interventions: ativan  1 mg   EKG Interpreted by Me: sinus    Cardiac Tele Interpreted by Me: sinus    I have independently interpreted the  CT  images and agree with the radiologist finding   Social Determinant of Health: psychiatric disease    Disposition and Follow Up: admit       Final diagnoses:  Falls  Delirium    ED  Discharge Orders     None          Simon Lavonia SAILOR, MD 05/25/24 1931

## 2024-05-25 NOTE — ED Notes (Signed)
 Pt restless and moving when b/p being taken

## 2024-05-25 NOTE — ED Triage Notes (Signed)
 Pt bib wheelchair by caregiver. Endorses pt fall yesterday. Reports pt baseline is ambulation without assistance, currently lethargic and BLE weakness. Pt restless in triage. Denies head injury

## 2024-05-25 NOTE — ED Notes (Signed)
 CT went to get patient to transport to CT, pt has legs through the side rail sitting on edge of mattress. Difficult to redirect. Placed back in bed seizure pads placed for safety to avoid pt from putting legs through the side rail

## 2024-05-26 DIAGNOSIS — F312 Bipolar disorder, current episode manic severe with psychotic features: Secondary | ICD-10-CM | POA: Diagnosis not present

## 2024-05-26 DIAGNOSIS — R531 Weakness: Secondary | ICD-10-CM | POA: Diagnosis not present

## 2024-05-26 DIAGNOSIS — Z743 Need for continuous supervision: Secondary | ICD-10-CM | POA: Diagnosis not present

## 2024-05-26 DIAGNOSIS — R296 Repeated falls: Secondary | ICD-10-CM

## 2024-05-26 DIAGNOSIS — F0280 Dementia in other diseases classified elsewhere without behavioral disturbance: Secondary | ICD-10-CM

## 2024-05-26 DIAGNOSIS — R410 Disorientation, unspecified: Secondary | ICD-10-CM | POA: Diagnosis present

## 2024-05-26 MED ORDER — HALOPERIDOL 5 MG PO TABS
10.0000 mg | ORAL_TABLET | Freq: Two times a day (BID) | ORAL | Status: DC
  Administered 2024-05-26 – 2024-05-29 (×6): 10 mg via ORAL
  Filled 2024-05-26 (×7): qty 2

## 2024-05-26 MED ORDER — BENZTROPINE MESYLATE 1 MG PO TABS
1.0000 mg | ORAL_TABLET | Freq: Two times a day (BID) | ORAL | Status: DC
  Administered 2024-05-26 – 2024-05-29 (×6): 1 mg via ORAL
  Filled 2024-05-26 (×7): qty 1

## 2024-05-26 MED ORDER — LORAZEPAM 2 MG/ML IJ SOLN
1.0000 mg | Freq: Once | INTRAMUSCULAR | Status: AC
  Administered 2024-05-26: 1 mg via INTRAVENOUS
  Filled 2024-05-26: qty 1

## 2024-05-26 MED ORDER — ACETAMINOPHEN 650 MG RE SUPP: 650.0000 mg | Freq: Four times a day (QID) | RECTAL | Status: DC | PRN

## 2024-05-26 MED ORDER — ENOXAPARIN SODIUM 40 MG/0.4ML IJ SOSY
40.0000 mg | PREFILLED_SYRINGE | INTRAMUSCULAR | Status: DC
  Administered 2024-05-27: 40 mg via SUBCUTANEOUS
  Filled 2024-05-26 (×3): qty 0.4

## 2024-05-26 MED ORDER — ZIPRASIDONE HCL 40 MG PO CAPS
60.0000 mg | ORAL_CAPSULE | Freq: Two times a day (BID) | ORAL | Status: DC
  Administered 2024-05-27 – 2024-05-29 (×5): 60 mg via ORAL
  Filled 2024-05-26 (×6): qty 1

## 2024-05-26 MED ORDER — SODIUM CHLORIDE 0.9% FLUSH
3.0000 mL | Freq: Two times a day (BID) | INTRAVENOUS | Status: DC
  Administered 2024-05-26 – 2024-05-28 (×4): 3 mL via INTRAVENOUS

## 2024-05-26 MED ORDER — ACETAMINOPHEN 325 MG PO TABS
650.0000 mg | ORAL_TABLET | Freq: Four times a day (QID) | ORAL | Status: DC | PRN
  Administered 2024-05-27 – 2024-05-28 (×2): 650 mg via ORAL
  Filled 2024-05-26 (×2): qty 2

## 2024-05-26 MED ORDER — TRAZODONE HCL 50 MG PO TABS
50.0000 mg | ORAL_TABLET | Freq: Every evening | ORAL | Status: DC | PRN
  Administered 2024-05-29: 50 mg via ORAL
  Filled 2024-05-26: qty 1

## 2024-05-26 MED ORDER — HALOPERIDOL LACTATE 5 MG/ML IJ SOLN
2.0000 mg | Freq: Once | INTRAMUSCULAR | Status: AC
  Administered 2024-05-26: 2 mg via INTRAVENOUS
  Filled 2024-05-26: qty 1

## 2024-05-26 MED ORDER — DIVALPROEX SODIUM 500 MG PO DR TAB
750.0000 mg | DELAYED_RELEASE_TABLET | Freq: Two times a day (BID) | ORAL | Status: DC
  Administered 2024-05-26 – 2024-05-29 (×6): 750 mg via ORAL
  Filled 2024-05-26 (×7): qty 1

## 2024-05-26 MED ORDER — HALOPERIDOL LACTATE 5 MG/ML IJ SOLN
2.5000 mg | Freq: Once | INTRAMUSCULAR | Status: AC
  Administered 2024-05-26: 2.5 mg via INTRAVENOUS
  Filled 2024-05-26: qty 1

## 2024-05-26 NOTE — ED Notes (Addendum)
 Tried encouraging pt to drinking water  at this time, but unsuccessful at time. Will try later

## 2024-05-26 NOTE — ED Notes (Signed)
 MD Countryman notified of patient behaviors; patient restless, frequent attempts to unsafely exit stretcher; while she still has an IV may she a med.SABRASABRASABRA?

## 2024-05-26 NOTE — ED Notes (Addendum)
 Pt ate 1/3 of pudding cup

## 2024-05-26 NOTE — ED Notes (Signed)
 Called [facility] to transport the patient to [facility] rm# [id number]

## 2024-05-26 NOTE — ED Notes (Signed)
 Tech to sit with patient d/t patient's behavior. Continuing to attempt to climb out of bed. Bed alarm in place.

## 2024-05-26 NOTE — Hospital Course (Addendum)
 Allison Bolton is a 54 yo female with PMH bipolar disorder, manic type, major neurocognitive disorder, asthma, fibromyalgia, DMII, ovarian/cervical CA, Meniere's.  Recent prolonged hospitalization with behavioral health from 8/14 until 11/2.  Treated for acute psychosis with mania and psychotic symptoms.  Presented to [facility] on 11/20 with weakness accompanied by caretaker.  Told to be at her neurobaseline which is usually responding to herself and talking to herself unable to hold meaningful conversation.  Able to walk around at baseline.  Over the past couple days was having multiple falls with legs giving out.  Also having more difficulty feeding herself. Psychiatry notes reviewed, last note from 11/1 notes patient pleasant but minimal to engage.  Was having witnessed falls as well.  Due to falls prior to hospitalization, she underwent multiple imaging workup. She was admitted for ongoing observation.

## 2024-05-26 NOTE — Assessment & Plan Note (Signed)
-   see BPD

## 2024-05-26 NOTE — Assessment & Plan Note (Addendum)
-   Already happening during time recently at behavioral health.  Unclear if due to oversedation especially as she is significantly sedated and somnolent in bed when seen - UA negative for signs of infection; appears overall nontoxic just over sedated -CT head unremarkable -CT cervical spine negative for acute abnormality -CT T/L spine negative for acute findings.  Chronic appearing L2 compression fracture with 50% height loss and 4 mm retropulsion into central canal.  Chronic appearing mild fractures at T2 and T6 -No significant spinal tenderness on exam - PT/OT evals.  No restrictions.  No brace needed - will consider MRI brain but for now still suspecting over-sedation (able to awaken and eat bfast this am but then back asleep)

## 2024-05-26 NOTE — ED Notes (Signed)
 Pt urinated on self. Brief replaced and pt cleaned up

## 2024-05-26 NOTE — ED Notes (Signed)
[facility] here to transport pt 

## 2024-05-26 NOTE — H&P (Signed)
 History and Physical    Allison Bolton  FMW:[id number]  DOB: 07-19-1970  DOA: 05/25/2024  PCP: [facility] Patient coming from: Home  Chief Complaint: Falls  HPI:  Allison Bolton is a 53 yo female with PMH bipolar disorder, manic type, major neurocognitive disorder, asthma, fibromyalgia, DMII, ovarian/cervical CA, Meniere's.  Recent prolonged hospitalization with behavioral health from 8/14 until 11/2.  Treated for acute psychosis with mania and psychotic symptoms.  Presented to [facility] on 11/20 with weakness accompanied by caretaker.  Told to be at her neurobaseline which is usually responding to herself and talking to herself unable to hold meaningful conversation.  Able to walk around at baseline.  Over the past couple days was having multiple falls with legs giving out.  Also having more difficulty feeding herself. Psychiatry notes reviewed, last note from 11/1 notes patient pleasant but minimal to engage.  Was having witnessed falls as well.  Due to falls prior to hospitalization, she underwent multiple imaging workup. She was admitted for ongoing observation.  I have personally briefly reviewed patient's old medical records in [facility] and discussed patient with the ER provider when appropriate/indicated.  Assessment and Plan: * Falls - Already happening during time recently at behavioral health.  Unclear if due to oversedation especially as she is significantly sedated and somnolent in bed when seen - UA negative for signs of infection; appears overall nontoxic just over sedated -CT head unremarkable -CT cervical spine negative for acute abnormality -CT T/L spine negative for acute findings.  Chronic appearing L2 compression fracture with 50% height loss and 4 mm retropulsion into central canal.  Chronic appearing mild fractures at T2 and T6 -No significant spinal tenderness on exam - PT/OT evals.  No restrictions.  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) - see BPD  Bipolar I disorder, current or most recent episode manic, with psychotic features (HCC) - Home meds resumed - Psychiatry consulted given concern for oversedation - On Cogentin , Depakote , Haldol , Geodon     Code Status:     Code Status: Full Code  DVT Prophylaxis:   enoxaparin  (LOVENOX ) injection 40 mg Start: 05/26/24 2200   Anticipated disposition is to: TBD after PT/OT evals  History: Past Medical History:  Diagnosis Date   Anxiety    Arthritis    Asthma    Bipolar 1 disorder (HCC)    Diverticulosis    Dysrhythmia    sts I have heart palpitations   Fibromyalgia    H/O hiatal hernia    4   Headache(784.0)    High blood pressure    Meniere's disease    S/P colonoscopy    Dr. Golda 2010: few small diverticula at sigmoid. otherwise normal.    Shortness of breath     Past Surgical History:  Procedure Laterality Date   ABDOMINAL HYSTERECTOMY     APPENDECTOMY     CESAREAN SECTION     CHOLECYSTECTOMY     COLONOSCOPY N/A 12/25/2023   Procedure: COLONOSCOPY;  Surgeon: Eartha Angelia Sieving, MD;  [facility];  Service: Gastroenterology;  Laterality: N/A;  11:30am, asa 1   exploratory laparoscopy     FOOT SURGERY     HEMORRHOID SURGERY     LAPAROSCOPIC APPENDECTOMY  02/19/2011   Procedure: APPENDECTOMY LAPAROSCOPIC;  Surgeon: Oneil DELENA Budge;  Location: [facility];  Service: General;  Laterality: N/A;   LAPAROSCOPY  02/19/2011   Procedure: LAPAROSCOPY DIAGNOSTIC;  Surgeon: Oneil DELENA  Mavis;  Location: [facility];  Service: General;  Laterality: N/A;   left ovarian removal     multiple hernia repairs     Right ovarian removal     TONSILLECTOMY     TONSILLECTOMY AND ADENOIDECTOMY     tubes in ears     UMBILICAL HERNIA REPAIR  Dec 2011   Dr. Mavis   wisdom teeth removal       reports that she has been smoking cigarettes. She started smoking about 42 years ago. She has a 18.7  pack-year smoking history. She has never used smokeless tobacco. She reports current drug use. Drug: Marijuana. She reports that she does not drink alcohol.  Allergies  Allergen Reactions   Amoxicillin Dermatitis, Rash and Other (See Comments)    Patient states she feels sunburnt when taking amoxicillin   Tramadol Dermatitis and Rash   Amoxicillin-Pot Clavulanate Dermatitis    Sunburn-like painful rash    Family History  Problem Relation Age of Onset   Thyroid  disease Mother    Colon cancer Father    Breast cancer Maternal Aunt    Colon cancer Brother    Colon cancer Other        paternal and maternal grandfather   Meniere's disease Other    Anesthesia problems Neg Hx    Hypotension Neg Hx    Malignant hyperthermia Neg Hx    Pseudochol deficiency Neg Hx    Home Medications: Prior to Admission medications   Medication Sig Start Date End Date Taking? Authorizing Provider  albuterol  (VENTOLIN  HFA) 108 (90 Base) MCG/ACT inhaler Inhale 2 puffs into the lungs every 4 (four) hours as needed for wheezing or shortness of breath. 05/07/24 06/06/24 Yes Towana Leita SAILOR, MD  benztropine  (COGENTIN ) 1 MG tablet Take 1 tablet (1 mg total) by mouth 2 (two) times daily. 05/07/24 06/06/24 Yes Towana Leita SAILOR, MD  divalproex  (DEPAKOTE ) 250 MG DR tablet Take 3 tablets (750 mg total) by mouth every 12 (twelve) hours. 05/07/24 06/06/24 Yes Towana Leita SAILOR, MD  haloperidol  (HALDOL ) 10 MG tablet Take 1 tablet (10 mg total) by mouth every 12 (twelve) hours. 05/07/24 06/06/24 Yes Towana Leita SAILOR, MD  traZODone  (DESYREL ) 50 MG tablet Take 1 tablet (50 mg total) by mouth at bedtime as needed for sleep. 05/07/24 06/06/24 Yes Towana Leita SAILOR, MD  ziprasidone  (GEODON ) 60 MG capsule Take 1 capsule (60 mg total) by mouth 2 (two) times daily with a meal. 05/07/24 06/06/24 Yes Towana Leita SAILOR, MD  estrogens , conjugated, (PREMARIN ) 0.625 MG tablet Take 0.625 mg by mouth daily.   09/11/11  [provider]    Review of  Systems:  Review of Systems  Unable to perform ROS: Mental acuity    Physical Exam:  Vitals:   05/26/24 1338 05/26/24 1551 05/26/24 1552 05/26/24 1646  BP: 120/77 122/75 122/75 (!) 107/97  Pulse:  76  88  Resp:  18    Temp:  99.2 F (37.3 C)    TempSrc:  Rectal    SpO2:  99%    Weight:       Physical Exam Constitutional:      Comments: Over sedated appearing and impulsive but able to be redirected if someone present in person  HENT:     Head: Normocephalic and atraumatic.     Mouth/Throat:     Mouth: Mucous membranes are dry.  Eyes:     Extraocular Movements: Extraocular movements intact.  Cardiovascular:     Rate and Rhythm: Normal  rate and regular rhythm.  Pulmonary:     Effort: Pulmonary effort is normal. No respiratory distress.     Breath sounds: Normal breath sounds. No wheezing.  Abdominal:     General: Bowel sounds are normal. There is no distension.     Palpations: Abdomen is soft.     Tenderness: There is no abdominal tenderness.  Musculoskeletal:        General: No tenderness (No spinous process tenderness).     Cervical back: Normal range of motion and neck supple.  Skin:    General: Skin is warm and dry.  Neurological:     Comments: Difficulty following commands but moving all 4 extremities purposefully.  Grossly confused and oversedated      Labs on Admission:  I have personally reviewed following labs and imaging studies Results for orders placed or performed during the hospital encounter of 05/25/24 (from the past 24 hours)  Valproic  acid level     Status: Abnormal   Collection Time: 05/25/24  8:36 PM  Result Value Ref Range   Valproic  Acid Lvl 107 (H) 50 - 100 ug/mL     Radiological Exams on Admission: CT Thoracic Spine Wo Contrast Result Date: 05/25/2024 EXAM: CT THORACIC AND LUMBAR SPINE WITHOUT INTRAVENOUS CONTRAST 05/25/2024 09:16:27 PM TECHNIQUE: CT of the thoracic and lumbar spine was performed without the administration of intravenous  contrast. Multiplanar reformatted images are provided for review. Automated exposure control, iterative reconstruction, and/or weight based adjustment of the mA/kV was utilized to reduce the radiation dose to as low as reasonably achievable. Incidental adrenal and/or renal findings do not require follow up imaging. COMPARISON: X-ray lumbar spine 05/04/2019. CLINICAL HISTORY: Blunt trauma. FINDINGS: THORACIC SPINE: BONES AND ALIGNMENT: Normal vertebral body heights. Chronic-appearing supraendplate T2 and T6 mild compression fractures. Diffusely decreased bone density. Normal alignment. DEGENERATIVE CHANGES: No significant degenerative changes. SOFT TISSUES: Atherosclerotic plaque. LUMBAR SPINE: BONES AND ALIGNMENT: Normal vertebral body heights. Interval chronic-appearing fracture of the L2 level with at least 50% vertebral body height loss and 4 mm retropulsion into the central canal. Diffusely decreased bone density. Normal alignment. DEGENERATIVE CHANGES: No significant degenerative changes. SOFT TISSUES: Bilateral nephrolithiasis. LUNGS: Bibasilar linear atelectasis versus scarring. IMPRESSION: 1. No acute findings. 2. Interval chronic-appearing L2 vertebral body compression fracture with at least 50% height loss and 4 mm retropulsion into the central canal. 3. Chronic-appearing mild compression fractures at T2 and T6. Electronically signed by: Morgane Naveau MD 05/25/2024 09:45 PM EST RP Workstation: [id number]   CT Lumbar Spine Wo Contrast Result Date: 05/25/2024 EXAM: CT THORACIC AND LUMBAR SPINE WITHOUT INTRAVENOUS CONTRAST 05/25/2024 09:16:27 PM TECHNIQUE: CT of the thoracic and lumbar spine was performed without the administration of intravenous contrast. Multiplanar reformatted images are provided for review. Automated exposure control, iterative reconstruction, and/or weight based adjustment of the mA/kV was utilized to reduce the radiation dose to as low as reasonably achievable. Incidental adrenal  and/or renal findings do not require follow up imaging. COMPARISON: X-ray lumbar spine 05/04/2019. CLINICAL HISTORY: Blunt trauma. FINDINGS: THORACIC SPINE: BONES AND ALIGNMENT: Normal vertebral body heights. Chronic-appearing supraendplate T2 and T6 mild compression fractures. Diffusely decreased bone density. Normal alignment. DEGENERATIVE CHANGES: No significant degenerative changes. SOFT TISSUES: Atherosclerotic plaque. LUMBAR SPINE: BONES AND ALIGNMENT: Normal vertebral body heights. Interval chronic-appearing fracture of the L2 level with at least 50% vertebral body height loss and 4 mm retropulsion into the central canal. Diffusely decreased bone density. Normal alignment. DEGENERATIVE CHANGES: No significant degenerative changes. SOFT TISSUES: Bilateral nephrolithiasis. LUNGS:  Bibasilar linear atelectasis versus scarring. IMPRESSION: 1. No acute findings. 2. Interval chronic-appearing L2 vertebral body compression fracture with at least 50% height loss and 4 mm retropulsion into the central canal. 3. Chronic-appearing mild compression fractures at T2 and T6. Electronically signed by: Morgane Naveau MD 05/25/2024 09:45 PM EST RP Workstation: [id number]   CT Cervical Spine Wo Contrast Result Date: 05/25/2024 EXAM: CT CERVICAL SPINE WITHOUT CONTRAST 05/25/2024 05:43:46 PM TECHNIQUE: CT of the cervical spine was performed without the administration of intravenous contrast. Multiplanar reformatted images are provided for review. Automated exposure control, iterative reconstruction, and/or weight based adjustment of the mA/kV was utilized to reduce the radiation dose to as low as reasonably achievable. COMPARISON: CT cervical spine 05/03/2019. CLINICAL HISTORY: Polytrauma, blunt. FINDINGS: LIMITATIONS/ARTIFACTS: Examination is slightly limited by motion. CERVICAL SPINE: BONES AND ALIGNMENT: Straightening and slight reversal of the normal cervical lordosis. No evidence of traumatic malalignment. Cervical  vertebral body heights are maintained. Irregularity of the T2 superior endplate with up to 20% height loss centrally. This finding was not present on the studies from 2020 and is age indeterminate. Recommend correlation with tenderness at this level and consider MRI of the thoracic spine for further evaluation if clinically indicated. DEGENERATIVE CHANGES: No significant degenerative changes. SOFT TISSUES: No prevertebral soft tissue swelling. IMPRESSION: 1. No acute abnormality of the cervical spine. 2. Irregularity and up to 20% central height loss of the T2 superior endplate, age indeterminate. Recommend correlation with tenderness as this level and consider thoracic spine MRI for further evaluation if clinically warranted. Electronically signed by: Donnice Mania MD 05/25/2024 05:58 PM EST RP Workstation: [id number]   CT Head Wo Contrast Result Date: 05/25/2024 EXAM: CT HEAD WITHOUT CONTRAST 05/25/2024 05:43:46 PM TECHNIQUE: CT of the head was performed without the administration of intravenous contrast. Automated exposure control, iterative reconstruction, and/or weight based adjustment of the mA/kV was utilized to reduce the radiation dose to as low as reasonably achievable. COMPARISON: CT head 05/03/2019. CLINICAL HISTORY: multiple falls. eval for subdural/epidura FINDINGS: BRAIN AND VENTRICLES: No acute hemorrhage. No evidence of acute infarct. No hydrocephalus. No extra-axial collection. No mass effect or midline shift. ORBITS: No acute abnormality. SINUSES: No acute abnormality. SOFT TISSUES AND SKULL: No acute soft tissue abnormality. No skull fracture. IMPRESSION: 1. No acute intracranial abnormality. Electronically signed by: Donnice Mania MD 05/25/2024 05:52 PM EST RP Workstation: [id number]   CT Thoracic Spine Wo Contrast  Final Result    CT Lumbar Spine Wo Contrast  Final Result    CT Head Wo Contrast  Final Result    CT Cervical Spine Wo Contrast  Final Result      Consults called:   Psychiatry    EKG: Independently reviewed. NSR   Alm Apo, MD [facility] 05/26/2024, 6:27 PM

## 2024-05-26 NOTE — Assessment & Plan Note (Addendum)
-   Home meds resumed - Psychiatry consulted given concern for oversedation; recommended for her to remain on her regimen given benefits outweigh risks to decrease for now - On Cogentin , Depakote , Haldol , Geodon

## 2024-05-26 NOTE — ED Notes (Signed)
 The pts caregiver Amber called to check on pt. She notified me that he is usually altered and does not communicate well but she is usually able to walk around and use the bathroom independently. She expressed concerns about all the medication she was taking and was hoping someone could reassess the meds that she needs.

## 2024-05-27 DIAGNOSIS — R296 Repeated falls: Secondary | ICD-10-CM | POA: Diagnosis not present

## 2024-05-27 DIAGNOSIS — M4850XA Collapsed vertebra, not elsewhere classified, site unspecified, initial encounter for fracture: Secondary | ICD-10-CM

## 2024-05-27 DIAGNOSIS — F312 Bipolar disorder, current episode manic severe with psychotic features: Secondary | ICD-10-CM | POA: Diagnosis not present

## 2024-05-27 LAB — HIV ANTIBODY (ROUTINE TESTING W REFLEX): HIV Screen 4th Generation wRfx: NONREACTIVE

## 2024-05-27 LAB — FOLATE: Folate: >20 ng/mL (ref >5.9)

## 2024-05-27 LAB — AMMONIA: Ammonia: 46 umol/L — ABNORMAL HIGH (ref 9–35)

## 2024-05-27 LAB — VITAMIN B12: Vitamin B-12: 735 pg/mL (ref 180–914)

## 2024-05-27 NOTE — Plan of Care (Signed)
  Problem: Clinical Measurements: Goal: Ability to maintain clinical measurements within normal limits will improve Outcome: Progressing   Problem: Activity: Goal: Risk for activity intolerance will decrease Outcome: Progressing   Problem: Nutrition: Goal: Adequate nutrition will be maintained Outcome: Progressing   Problem: Pain Managment: Goal: General experience of comfort will improve and/or be controlled Outcome: Progressing   Problem: Safety: Goal: Ability to remain free from injury will improve Outcome: Progressing   Problem: Education: Goal: Knowledge of General Education information will improve Description: Including pain rating scale, medication(s)/side effects and non-pharmacologic comfort measures Outcome: Not Progressing

## 2024-05-27 NOTE — Assessment & Plan Note (Signed)
-   CT T/L spine negative for acute findings.  Chronic appearing L2 compression fracture with 50% height loss and 4 mm retropulsion into central canal.  Chronic appearing mild fractures at T2 and T6  - Not causing any significant pain on exam and low suspicion contributing to unsteady gait -Back brace would be offered if does have pain but for now seems comfortable -Okay to work with PT/OT for evaluations

## 2024-05-27 NOTE — TOC Initial Note (Signed)
 Transition of Care [facility]) - Initial/Assessment Note    Patient Details  Name: Allison Bolton MRN: [id number] Date of Birth: 1970-10-06  Transition of Care [facility]) CM/SW Contact:    Bridget Cordella Simmonds, LCSW Phone Number: 05/27/2024, 1:57 PM  Clinical Narrative:   Pt oriented x1.  CSW spoke with Jama Candy, social worker/[place].  [email or web address].  He confirmed he is legal guardian.  Confirmed plan is for pt to return to group home at [place].  On call DSS worker for the weekend: [phone].  Moon also completed with Jama and emailed to him.    CSW spoke with [employer], director/[facility] [facility] group home.  She is planning on pt returning but pt does need to be ambulatory in order to return.  Amber is available over the weekend.                Expected Discharge Plan: Group Home Barriers to Discharge: Continued Medical Work up   Patient Goals and CMS Choice            Expected Discharge Plan and Services In-house Referral: Clinical Social Work     Living arrangements for the past 2 months: Group Home                                      Prior Living Arrangements/Services Living arrangements for the past 2 months: Group Home   Patient language and need for interpreter reviewed:: No        Need for Family Participation in Patient Care: Yes (Comment) Care giver support system in place?: Yes (comment)   Criminal Activity/Legal Involvement Pertinent to Current Situation/Hospitalization: No - Comment as needed  Activities of Daily Living      Permission Sought/Granted                  Emotional Assessment Appearance:: Appears stated age Attitude/Demeanor/Rapport: Unable to Assess Affect (typically observed): Unable to Assess Orientation: : Oriented to Self      Admission diagnosis:  Delirium [R41.0] Falls [R29.6] Patient Active Problem List   Diagnosis Date Noted   Vertebral compression fracture (HCC) 05/27/2024    Falls 05/25/2024   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) 03/28/2024   Bipolar affective disorder, current episode manic with psychotic symptoms (HCC) 02/18/2024   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 [place] [address] Phone: [phone] Fax: [phone]  [id number] - [place], [place] - [address] AT [facility] [address] [place] [address] Phone: [phone] Fax: [phone]  [id number] - [place], [place] - [address] [place] [address] Phone: [phone] Fax: [phone]     Social Drivers of Health (SDOH) Social History: SDOH Screenings   Food Insecurity: Patient Unable To Answer (05/07/2024)  Recent Concern: Food Insecurity -  Food Insecurity Present (02/18/2024)  Housing: Patient Unable To Answer (05/07/2024)  Recent Concern: Housing - High Risk (02/18/2024)  Transportation Needs: Patient Unable To Answer (05/07/2024)  Recent Concern: Transportation Needs - Unmet Transportation Needs (02/18/2024)  Utilities: Patient Unable To Answer (05/07/2024)  Recent Concern: Utilities - At Risk (02/18/2024)  Alcohol Screen: Low Risk  (02/18/2024)  Social Connections: Unknown (10/14/2021)   Received from [facility]  Tobacco Use: High Risk (05/25/2024)  Health Literacy: Unknown (10/14/2021)   Received from [facility]   SDOH Interventions:     Readmission Risk Interventions     No data to display

## 2024-05-27 NOTE — Consult Note (Addendum)
 [facility] Psychiatric Consult Initial  Patient Name: .Allison Bolton  MRN: [id number]  DOB: 30-Nov-1970  Consult Order details:  Orders (From admission, onward)     Start     Ordered   05/26/24 1821  IP CONSULT TO PSYCHIATRY       Ordering Provider: Patsy Lenis, MD  Provider:  (Not yet assigned)  Question Answer Comment  Location [facility]   Reason for Consult? concern for oversedation/polypharmacy      05/26/24 1820             Mode of Visit: In person    Psychiatry Consult Evaluation  Service Date: May 27, 2024 LOS:  LOS: 0 days  Chief Complaint oversedation/polypharmacy concerns  Primary Psychiatric Diagnoses  Bipolar 1 disorder, most recent episode manic with psychotic features 2.  Major neurocognitive disorder  Assessment  Allison Bolton is a 53 y.o. female admitted: Medicallyfor 05/25/2024  4:02 PM for falls and previous has experienced falls when at [facility]. Psych consulted due to concerns for polypharmacy / oversedation, which is very reasonable given she is on 2 antipsychotics, depakote , and benztropine  for EPS.   The patient carries diagnoses of bipolar disorder with psychotic features and major neurocognitive disorder. She required a prolonged behavioral health hospitalization due to the need for guardianship and appropriate group home placement. She has been stable for over a month on her current regimen: Haldol  10 mg BID, ziprasidone  60 mg BID, benztropine  1 mg BID, and Depakote  750 mg BID. Both direct observation and collateral from her group home manager indicate that her baseline includes some degree of sedation, which has historically been more beneficial than harmful in maintaining psychiatric stability and allowing her to remain in the group home setting.  At present, her sedation appears consistent with her baseline and does not seem to be excessive. Continue to monitor her level of alertness over the next several days, and if  there is ongoing concern for oversedation, psychiatry can be re-consulted. Reducing ziprasidone  could be considered if necessary, but this carries a significant risk of psychiatric destabilization, which could jeopardize her group home placement.  If there is concern for altered mentation beyond her established baseline, a thorough encephalopathy workup is recommended. Please collaborate closely with the group home coordinator, who is familiar with her baseline functioning.  The more pressing concern is her recent falls, which, per group home staff, are not due to sedation, confusion, or unsteadiness, but rather episodes of her knees buckling. This is a new symptom for her. Recommend a comprehensive medical evaluation and PT/OT assessment to investigate the etiology of these episodes, rather than attributing them solely to psychiatric causes.  Please ensure ongoing communication with the group home caretaker and notify the legal guardian regarding her admission and current status.  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)   Vertebral compression fracture (HCC)    Plan   ## Psychiatric Medication Recommendations:  Continue Haldol  10 mg twice daily for mood stabilization/psychosis Continue ziprasidone  60 mg twice daily for mood stabilization/psychosis as secondary psychotic due to treatment resistance Continue benztropine  1 mg twice daily for EPS Continue Depakote  750 mg twice daily for mood stabilization Previous levels have been appropriate therapeutic level and random level in ED was 107 but cannot be interpreted Repeat depakote  trough 11/22 AM S/p  hysterectomy   ## Medical Decision Making Capacity: Patient has a guardian and has thus been adjudicated incompetent; please involve patients  guardian in medical decision making.   ## Further Work-up:  --repeat depakote  level timed for tough level -- Recommend workup for knees buckling per observation by caretaker --If continued sedation recommend a thorough encephalopathy workup, as psychiatrically has been unchanged and meds unchanged for 1-2+ months -- most recent EKG on 11/21 had QtC of 439 -- Pertinent labwork reviewed earlier this admission includes: UA unremarkable, CMP unremarkable, CBC with differential unremarkable   ## Disposition:--Please coordinate with legal guardian and group home for transition back to group home or for discussion of alternative community placements as indicated (e.g. SNF).  ## Behavioral / Environmental: -Delirium Precautions: Delirium Interventions for Nursing and Staff: - RN to open blinds every AM. - To Bedside: Glasses, hearing aide, and pt's own shoes. Make available to patients. when possible and encourage use. - Encourage po fluids when appropriate, keep fluids within reach. - OOB to chair with meals. - Passive ROM exercises to all extremities with AM & PM care. - RN to assess orientation to person, time and place QAM and PRN. - Recommend extended visitation hours with familiar family/friends as feasible. - Staff to minimize disturbances at night. Turn off television when pt asleep or when not in use.    ## Safety and Observation Level:  - Based on my clinical evaluation, I estimate the patient to be at low risk of self harm in the current setting. - At this time, we defer to primary team to determine level of observation that is appropriate. Psychiatrically there is no specific indication for sitter, but it seems reasonable to continue due to patient's baseline mentation and due to fall risk but could also trial tele sitter again.   This decision is based on my review of the chart including patient's history and current presentation, interview of the patient, mental status examination, and  consideration of suicide risk including evaluating suicidal ideation, plan, intent, suicidal or self-harm behaviors, risk factors, and protective factors. This judgment is based on our ability to directly address suicide risk, implement suicide prevention strategies, and develop a safety plan while the patient is in the clinical setting. Please contact our team if there is a concern that risk level has changed.  CSSR Risk Category:C-SSRS RISK CATEGORY: No Risk  Suicide Risk Assessment: Patient has following modifiable risk factors for suicide: n/a Patient has following non-modifiable or demographic risk factors for suicide: n/a Patient has the following protective factors against suicide: n/a  Thank you for this consult request. Recommendations have been communicated to the primary team.  We will sign off at this time.   Justino Cornish, MD       History of Present Illness  Relevant Aspects of [facility] Course:    Patient Report:  Patient is tired this morning and does not participating in interview.  Patient does briefly awaken and say that she does not want to answer questions currently.  Reports that she is tired.  Says that she is fine.  Collateral information, Amber, group home production designer, theatre/television/film / caretaker, [phone]:  Reports that she did not see a major change in her until Monday or Tuesday. Medications were not changed at all. Reported that she started falling. Pt did not clarify any reasons for the change. Got her on the 2nd November. Reported that she went bowling on Thursday she was bowling and holding ball. They also went out to  eat on Monday and she was fine. No changes in medication changes whatsoever. Reports no other falls other than the falls on Monday/Tuesday prior to admission. Those falls were not too bad. Knee was buckling and she was falling. Reports that ever since she got her she has been tired appearing and reports that the social worker at [facility] had told her that  patient was always a tired appearing.  Amber reports that patient does not appear tired or confused or unsteady when she has been falling.  Reports that she notices patient has been buckling at the knees which has led to all of her falls.  Reports no other falls prior to these.  Reports that she will be at the hospital in the afternoon/evening on 11/21.  Collateral, previous psychiatric provider at [facility]: It was a little difficult to tell since she came in so manic. But she could certainly have her daytime meds reduced a bit if she has been psychiatrically stable if she is having sedation.   Psychiatric and Social History  Psychiatric History:  Information collected from chart review  Prev Dx/Sx: bipolar 1 disorder, neurocogntive disorder Current Psych Provider: not estabished but visit soon Home Meds (current): see current meds above  Previous Med Trials: unkown Therapy: unknown  Prior Psych Hospitalization: prolonged hospital for 2-3 months  Prior Self Harm: n/a Prior Violence: n/a  Family Psych History: n/a Family Hx suicide: n/a  Social History:  Currently living at gropu home as of the last month.  Access to weapons/lethal means: no   Substance History No substance use in controlled setting for several months.   Exam Findings  Physical Exam:  Vital Signs:  Temp:  [97.6 F (36.4 C)-99.2 F (37.3 C)] 98.5 F (36.9 C) (11/21 0842) Pulse Rate:  [74-89] 76 (11/21 0842) Resp:  [16-18] 16 (11/21 0842) BP: (107-126)/(75-97) 109/85 (11/21 0842) SpO2:  [85 %-100 %] 100 % (11/21 0842) Blood pressure 109/85, pulse 76, temperature 98.5 F (36.9 C), temperature source Oral, resp. rate 16, weight 83.9 kg, SpO2 100%. Body mass index is 29.86 kg/m.  Physical Exam Vitals and nursing note reviewed.  Constitutional:      Comments: tired  Pulmonary:     Effort: Pulmonary effort is normal.  Neurological:     General: No focal deficit present.  Psychiatric:     Comments: No  obvious EPS.      Mental Status Exam: General Appearance: laying in bed tired. Responding to questions briefly  Orientation:  disoriented at baseline and same on assessment.  Memory:  n/a  Concentration:  poor  Recall:  poor  Attention  poor  Eye Contact:  poor  Speech:  normal when awake  Language:  fair baseline  Volume:  decreased  Mood: did not assess  Affect:  irritable, not has pleasant as usually baseline but also was awoken from sleep  Thought Process:  baseline  Thought Content:  baseline. Chornic delusions  Suicidal Thoughts:  none  Homicidal Thoughts:  none  Judgement:  fair  Insight:  fair  Psychomotor Activity:  baseline, decreased, no eps  Akathisia:  no  Fund of Knowledge:  poor/fair      Assets:  group home support  Cognition:  impaired, severe neurocognitive d/o  ADL's:  intact at baseline with regard to mobility but current impaired pending PT/OT assessment  AIMS (if indicated):  0     Other History   These have been pulled in through the EMR, reviewed, and updated if appropriate.  Family History:  The patient's family history includes Breast cancer in her maternal aunt; Colon cancer in her brother, father, and another family member; Meniere's disease in an other family member; Thyroid  disease in her mother.  Medical History: Past Medical History:  Diagnosis Date   Anxiety    Arthritis    Asthma    Bipolar 1 disorder (HCC)    Diverticulosis    Dysrhythmia    sts I have heart palpitations   Fibromyalgia    H/O hiatal hernia    4   Headache(784.0)    High blood pressure    Meniere's disease    S/P colonoscopy    Dr. Golda 2010: few small diverticula at sigmoid. otherwise normal.    Shortness of breath     Surgical History: Past Surgical History:  Procedure Laterality Date   ABDOMINAL HYSTERECTOMY     APPENDECTOMY     CESAREAN SECTION     CHOLECYSTECTOMY     COLONOSCOPY N/A 12/25/2023   Procedure: COLONOSCOPY;  Surgeon: Eartha Angelia Sieving, MD;  Location: [facility];  Service: Gastroenterology;  Laterality: N/A;  11:30am, asa 1   exploratory laparoscopy     FOOT SURGERY     HEMORRHOID SURGERY     LAPAROSCOPIC APPENDECTOMY  02/19/2011   Procedure: APPENDECTOMY LAPAROSCOPIC;  Surgeon: Oneil DELENA Budge;  Location: [facility];  Service: General;  Laterality: N/A;   LAPAROSCOPY  02/19/2011   Procedure: LAPAROSCOPY DIAGNOSTIC;  Surgeon: Oneil DELENA Budge;  Location: [facility];  Service: General;  Laterality: N/A;   left ovarian removal     multiple hernia repairs     Right ovarian removal     TONSILLECTOMY     TONSILLECTOMY AND ADENOIDECTOMY     tubes in ears     UMBILICAL HERNIA REPAIR  Dec 2011   Dr. Budge   wisdom teeth removal       Medications:   Current Facility-Administered Medications:    acetaminophen  (TYLENOL ) tablet 650 mg, 650 mg, Oral, Q6H PRN **OR** acetaminophen  (TYLENOL ) suppository 650 mg, 650 mg, Rectal, Q6H PRN, Patsy Lenis, MD   benztropine  (COGENTIN ) tablet 1 mg, 1 mg, Oral, BID, Girguis, David, MD, 1 mg at 05/27/24 0902   divalproex  (DEPAKOTE ) DR tablet 750 mg, 750 mg, Oral, Q12H, Girguis, David, MD, 750 mg at 05/27/24 0901   enoxaparin  (LOVENOX ) injection 40 mg, 40 mg, Subcutaneous, Q24H, Girguis, David, MD, 40 mg at 05/27/24 0019   haloperidol  (HALDOL ) tablet 10 mg, 10 mg, Oral, Q12H, Patsy Lenis, MD, 10 mg at 05/27/24 0901   sodium chloride  flush (NS) 0.9 % injection 3 mL, 3 mL, Intravenous, Q12H, Girguis, David, MD, 3 mL at 05/27/24 0902   traZODone  (DESYREL ) tablet 50 mg, 50 mg, Oral, QHS PRN, Girguis, David, MD   ziprasidone  (GEODON ) capsule 60 mg, 60 mg, Oral, BID WC, Girguis, David, MD, 60 mg at 05/27/24 9097  Allergies: Allergies  Allergen Reactions   Amoxicillin Dermatitis, Rash and Other (See Comments)    Patient states she feels sunburnt when taking amoxicillin   Tramadol Dermatitis and Rash   Amoxicillin-Pot Clavulanate Dermatitis    Sunburn-like painful rash    Justino Cornish, MD

## 2024-05-27 NOTE — Plan of Care (Signed)
  Problem: Clinical Measurements: Goal: Ability to maintain clinical measurements within normal limits will improve Outcome: Progressing Goal: Will remain free from infection Outcome: Progressing   Problem: Pain Managment: Goal: General experience of comfort will improve and/or be controlled Outcome: Progressing   Problem: Skin Integrity: Goal: Risk for impaired skin integrity will decrease Outcome: Progressing

## 2024-05-27 NOTE — Progress Notes (Signed)
 Progress Note    Allison Bolton   FMW:[id number]  DOB: March 14, 1971  DOA: 05/25/2024     0 PCP: [facility]  Initial CC: falls   Hospital Course: [name] is a 53 yo female with PMH bipolar disorder, manic type, major neurocognitive disorder, asthma, fibromyalgia, DMII, ovarian/cervical CA, Meniere's.  Recent prolonged hospitalization with behavioral health from 8/14 until 11/2.  Treated for acute psychosis with mania and psychotic symptoms.  Presented to [facility] on 11/20 with weakness accompanied by caretaker.  Told to be at her neurobaseline which is usually responding to herself and talking to herself unable to hold meaningful conversation.  Able to walk around at baseline.  Over the past couple days was having multiple falls with legs giving out.  Also having more difficulty feeding herself. Psychiatry notes reviewed, last note from 11/1 notes patient pleasant but minimal to engage.  Was having witnessed falls as well.  Due to falls prior to hospitalization, she underwent multiple imaging workup. She was admitted for ongoing observation.  Interval History:  No events overnight.  Sitter present bedside this morning.  Still heavily sedated appearing and very lethargic this morning.  She did wake up enough to eat a little bit of breakfast and then went back to bed afterwards. Updated Jama, legal guardian on phone this morning. She is from a group home and can go back when discharged.   Assessment and Plan: * Falls - Already happening during time recently at behavioral health.  Unclear if due to oversedation especially as she is significantly sedated and somnolent in bed when seen - UA negative for signs of infection; appears overall nontoxic just over sedated -CT head unremarkable -CT cervical spine negative for acute abnormality -CT T/L spine negative for acute findings.  Chronic appearing L2 compression fracture with 50% height loss and 4 mm  retropulsion into central canal.  Chronic appearing mild fractures at T2 and T6 -No significant spinal tenderness on exam - PT/OT evals.  No restrictions.  No brace needed - will consider MRI brain but for now still suspecting over-sedation (able to awaken and eat bfast this am but then back asleep)   Bipolar I disorder, current or most recent episode manic, with psychotic features (HCC) - Home meds resumed - Psychiatry consulted given concern for oversedation - On Cogentin , Depakote , Haldol , Geodon   Vertebral compression fracture (HCC) - CT T/L spine negative for acute findings.  Chronic appearing L2 compression fracture with 50% height loss and 4 mm retropulsion into central canal.  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 No - Perform mobility assessment No - Perform mobility assessment No - Perform mobility assessment     What is the highest level of mobility based on the mobility assessment? Level 3 (Stands with assistance) - Balance while standing  and cannot march in place  Level 1 (Bedfast) - Unable to balance while sitting on edge of bed Level 1 (Bedfast) - Unable to balance while sitting on edge of bed     Is the above level different from baseline mobility prior to current illness? Yes - Recommend PT order Yes - Recommend PT order Yes - Recommend PT order        Diet: Diet Orders  (From admission, onward)     Start     Ordered   05/26/24 1750  Diet regular Fluid consistency: Thin  Diet effective now       Question:  Fluid consistency:  Answer:  Thin   05/26/24 1749            Barriers to discharge: none Disposition Plan:  Group Home HH orders placed: n/a Status is: Obs  Objective: Blood pressure 109/85, pulse 76, temperature 98.5 F (36.9 C), temperature source Oral, resp. rate 16, weight 83.9 kg, SpO2 100%.  Examination:  Physical Exam Constitutional:      Comments: Over sedated appearing and still lethargic in bed; ate b'fast but then back asleep and hard to awaken  HENT:     Head: Normocephalic and atraumatic.     Mouth/Throat:     Mouth: Mucous membranes are dry.  Eyes:     Extraocular Movements: Extraocular movements intact.  Cardiovascular:     Rate and Rhythm: Normal rate and regular rhythm.  Pulmonary:     Effort: Pulmonary effort is normal. No respiratory distress.     Breath sounds: Normal breath sounds. No wheezing.  Abdominal:     General: Bowel sounds are normal. There is no distension.     Palpations: Abdomen is soft.     Tenderness: There is no abdominal tenderness.  Musculoskeletal:        General: No tenderness (No spinous process tenderness).     Cervical back: Normal range of motion and neck supple.  Skin:    General: Skin is warm and dry.  Neurological:     Comments: Difficulty following commands but moving all 4 extremities purposefully.  Grossly confused and oversedated      Consultants:  Psychiatry   Procedures:    Data Reviewed: No results found for this or any previous visit (from the past 24 hours).  I have reviewed pertinent nursing notes, vitals, labs, and images as necessary. I have ordered labwork to follow up on as indicated.  I have reviewed the last notes from staff over past 24 hours. I have discussed patient's care plan and test results with nursing staff, CM/SW, and other staff as  appropriate.  Old records reviewed in assessment of this patient  Time spent: Greater than 50% of the 55 minute visit was spent in counseling/coordination of care for the patient as laid out in the A&P.   LOS: 0 days   Alm Apo, MD [facility] 05/27/2024, 11:28 AM

## 2024-05-27 NOTE — Evaluation (Signed)
 Physical Therapy Evaluation and Discharge  Patient Details Name: Allison Bolton MRN: [id number] DOB: Nov 18, 1970 Today's Date: 05/27/2024  History of Present Illness  Pt is a 53 y/o female who presents 05/25/2024 with weakness and frequent falls. Pt with a recent prolonged hospitalization with behavioral health from 8/14-11/2. Treated for acute psychosis with mania and psychotic symptoms. PMH significant for bipolar disorder, manic type, major neurocognitive disorder, asthma, fibromyalgia, DMII, ovarian/cervical CA, Meniere's.   Clinical Impression  Patient evaluated by Physical Therapy with no further acute PT needs identified. All education has been completed and the patient has no further questions. At the time of PT eval pt was sleeping and required increased time and cues to wake and participate. Overall pt was pleasant and agreeable to mobility. Anticipate pt is at/near baseline of function. As alertness improved, balance improved as well. Do not feel pt requires acute PT services at this time, however will d/c to mobility specialist team to encourage continued mobility throughout hospitalization. See below for any follow-up Physical Therapy or equipment needs. PT is signing off. Thank you for this referral.         If plan is discharge home, recommend the following: A little help with walking and/or transfers;Assistance with cooking/housework;Assist for transportation;Help with stairs or ramp for entrance   Can travel by private vehicle        Equipment Recommendations None recommended by PT  Recommendations for Other Services       Functional Status Assessment Patient has not had a recent decline in their functional status     Precautions / Restrictions Precautions Precautions: Fall Recall of Precautions/Restrictions: Impaired Precaution/Restrictions Comments: hx of cognitive deficits Restrictions Weight Bearing Restrictions Per Provider Order: No      Mobility  Bed  Mobility Overal bed mobility: Needs Assistance Bed Mobility: Supine to Sit, Sit to Supine     Supine to sit: Max assist, +2 for physical assistance, HOB elevated (Due to lethargy initially) Sit to supine: Supervision   General bed mobility comments: Max assist to get to EOB due to pt's lethargy and decreased desire to sit up. Upon return to room pt crawled into bed forwards and was able to reposition in bed without assist.    Transfers Overall transfer level: Needs assistance Equipment used: None Transfers: Sit to/from Stand, Bed to chair/wheelchair/BSC Sit to Stand: Contact guard assist   Step pivot transfers: Contact guard assist       General transfer comment: Hands on guarding due to lethargy initially. No assist required for basic transfers.    Ambulation/Gait Ambulation/Gait assistance: Min assist Gait Distance (Feet): 510 Feet Assistive device: None Gait Pattern/deviations: Step-through pattern, Decreased stride length, Drifts right/left, Trunk flexed Gait velocity: Decreased Gait velocity interpretation: 1.31 - 2.62 ft/sec, indicative of limited community ambulator   General Gait Details: Consistent drift to the L requiring cues to reposition to center of hall. Occasional assist for balance.  Stairs            Wheelchair Mobility     Tilt Bed    Modified Rankin (Stroke Patients Only)       Balance Overall balance assessment: History of Falls, Needs assistance Sitting-balance support: No upper extremity supported, Feet supported Sitting balance-Leahy Scale: Good     Standing balance support: No upper extremity supported, During functional activity Standing balance-Leahy Scale: Good  Pertinent Vitals/Pain Pain Assessment Pain Assessment: Faces Faces Pain Scale: No hurt    Home Living Family/patient expects to be discharged to:: Group home Living Arrangements: Group Home Available Help at Discharge:  Available 24 hours/day;Personal care attendant               Additional Comments: Resides at [facility] group home    Prior Function Prior Level of Function : Patient poor historian/Family not available;History of Falls (last six months)             Mobility Comments: patient is ambulatory at baseline, but per chart review with multiple recent falls at [facility] and at group home which has been attributed to oversedation. ADLs Comments: independent     Extremity/Trunk Assessment   Upper Extremity Assessment Upper Extremity Assessment: Defer to OT evaluation    Lower Extremity Assessment Lower Extremity Assessment: Overall WFL for tasks assessed    Cervical / Trunk Assessment Cervical / Trunk Assessment: Other exceptions Cervical / Trunk Exceptions: Forward head posture with rounded shoulders; per chart review chronic mild fractures at T2 and T6 (no brace needed).  Communication   Communication Communication: Impaired Factors Affecting Communication: Reduced clarity of speech    Cognition Arousal: Alert (initially sleeping and lethargic. Able to wake up with increased time.) Behavior During Therapy: Impulsive, Restless                             Following commands: Impaired Following commands impaired: Follows one step commands inconsistently     Cueing Cueing Techniques: Verbal cues, Gestural cues, Tactile cues     General Comments      Exercises     Assessment/Plan    PT Assessment Patient does not need any further PT services  PT Problem List         PT Treatment Interventions      PT Goals (Current goals can be found in the Care Plan section)  Acute Rehab PT Goals Patient Stated Goal: eat lunch PT Goal Formulation: All assessment and education complete, DC therapy    Frequency       Co-evaluation PT/OT/SLP Co-Evaluation/Treatment: Yes Reason for Co-Treatment: Necessary to address cognition/behavior during functional  activity;To address functional/ADL transfers PT goals addressed during session: Balance;Mobility/safety with mobility OT goals addressed during session: ADL's and self-care       AM-PAC PT 6 Clicks Mobility  Outcome Measure Help needed turning from your back to your side while in a flat bed without using bedrails?: None Help needed moving from lying on your back to sitting on the side of a flat bed without using bedrails?: A Little Help needed moving to and from a bed to a chair (including a wheelchair)?: None Help needed standing up from a chair using your arms (e.g., wheelchair or bedside chair)?: None Help needed to walk in hospital room?: A Little Help needed climbing 3-5 steps with a railing? : A Little 6 Click Score: 21    End of Session Equipment Utilized During Treatment: Gait belt Activity Tolerance: Patient tolerated treatment well Patient left: in bed;with call bell/phone within reach;with bed alarm set;with nursing/sitter in room Nurse Communication: Mobility status PT Visit Diagnosis: History of falling (Z91.81);Unsteadiness on feet (R26.81)    Time: 1341-1405 PT Time Calculation (min) (ACUTE ONLY): 24 min   Charges:   PT Evaluation $PT Eval Low Complexity: 1 Low   PT General Charges $$ ACUTE PT VISIT: 1 Visit  Leita Sable, PT, DPT Acute Rehabilitation Services Secure Chat Preferred Office: [phone]   Leita JONETTA Sable 05/27/2024, 3:06 PM

## 2024-05-27 NOTE — Evaluation (Signed)
 Occupational Therapy Evaluation/Discharge Patient Details Name: RENETTE HSU MRN: [id number] DOB: [birth year] Today's Date: 05/27/2024   History of Present Illness   53 y/o female with PMH bipolar disorder, manic type, major neurocognitive disorder, asthma, fibromyalgia, DMII, ovarian/cervical CA, Meniere's.  Recent prolonged hospitalization with behavioral health from 8/14 until 11/2.  Treated for acute psychosis with mania and psychotic symptoms. Presented to [facility] on 11/20 with weakness and frequent falls.     Clinical Impressions Patient evaluated by Occupational Therapy with no further acute OT needs identified. Pt initially sleeping and lethargic although able to wake up and participate in evaluation. Pt required consistent verbal cueing for sequencing through BADL tasks. I believe pt is either at or near her baseline and I do not recommend any follow up OT services. OT is signing off. Thank you for this referral.      If plan is discharge home, recommend the following:   A little help with walking and/or transfers;A little help with bathing/dressing/bathroom;Supervision due to cognitive status     Functional Status Assessment   Patient has not had a recent decline in their functional status     Equipment Recommendations   None recommended by OT      Precautions/Restrictions   Precautions Precautions: Fall Recall of Precautions/Restrictions: Impaired Precaution/Restrictions Comments: hx of cognitive deficits Restrictions Weight Bearing Restrictions Per Provider Order: No     Mobility Bed Mobility Overal bed mobility: Needs Assistance Bed Mobility: Supine to Sit, Sit to Supine     Supine to sit: Max assist, +2 for physical assistance, HOB elevated (Due to lethargy initially) Sit to supine: Contact guard assist   General bed mobility comments: Required more assistance to transition to EOB due to lethargy    Transfers Overall transfer level:  Needs assistance Equipment used: None Transfers: Sit to/from Stand, Bed to chair/wheelchair/BSC Sit to Stand: Contact guard assist     Step pivot transfers: Contact guard assist     General transfer comment: consistent drift to the left      Balance Overall balance assessment: History of Falls, Needs assistance Sitting-balance support: No upper extremity supported, Feet supported Sitting balance-Leahy Scale: Good     Standing balance support: No upper extremity supported, During functional activity Standing balance-Leahy Scale: Good          ADL either performed or assessed with clinical judgement   ADL   General ADL Comments: Pt required max VC to remain on task and was able to complete grooming and toileting with either set-up or SBA. Terminated task before they were finished and would not return to finish when cued.     Vision Baseline Vision/History:  (unknown. Unable to provide history) Patient Visual Report: Other (comment) (did not report) Additional Comments: Pt was able to read room numbers on unit hallway. Although further vision assessment was not performed d/t to cognitive deficits.     Perception Perception: Not tested       Praxis Praxis: Not tested       Pertinent Vitals/Pain Pain Assessment Pain Assessment: Faces Faces Pain Scale: No hurt     Extremity/Trunk Assessment Upper Extremity Assessment Upper Extremity Assessment: Right hand dominant (not formally assessed. Pt was able to demonstrate functional strength during self care tasks. Limited effort demonstrated towards tasks.)   Lower Extremity Assessment Lower Extremity Assessment: Defer to PT evaluation   Cervical / Trunk Assessment Cervical / Trunk Assessment: Normal   Communication Communication Communication: Impaired Factors Affecting Communication: Reduced clarity of speech  Cognition Arousal: Alert (initially sleeping and lethargic. Able to wake up with increased time.) Behavior  During Therapy: Impulsive, Restless Cognition: History of cognitive impairments     Following commands: Impaired Following commands impaired: Follows one step commands inconsistently     Cueing  General Comments   Cueing Techniques: Verbal cues;Gestural cues;Tactile cues              Home Living Family/patient expects to be discharged to:: Group home Living Arrangements: Group Home Available Help at Discharge: Available 24 hours/day;Personal care attendant     Additional Comments: Resides at [facility] group home      Prior Functioning/Environment Prior Level of Function : Patient poor historian/Family not available;History of Falls (last six months)    Mobility Comments: patient is ambulatory at baseline      OT Problem List: Impaired balance (sitting and/or standing)        OT Goals(Current goals can be found in the care plan section)   Acute Rehab OT Goals OT Goal Formulation: All assessment and education complete, DC therapy      Co-evaluation PT/OT/SLP Co-Evaluation/Treatment: Yes Reason for Co-Treatment: Necessary to address cognition/behavior during functional activity;To address functional/ADL transfers   OT goals addressed during session: ADL's and self-care      AM-PAC OT 6 Clicks Daily Activity     Outcome Measure Help from another person eating meals?: A Little Help from another person taking care of personal grooming?: A Little Help from another person toileting, which includes using toliet, bedpan, or urinal?: A Little Help from another person bathing (including washing, rinsing, drying)?: A Little Help from another person to put on and taking off regular upper body clothing?: A Little Help from another person to put on and taking off regular lower body clothing?: A Little 6 Click Score: 18   End of Session Equipment Utilized During Treatment: Gait belt  Activity Tolerance: Patient tolerated treatment well Patient left: in bed;with  call bell/phone within reach;with bed alarm set;with nursing/sitter in room  OT Visit Diagnosis: Unsteadiness on feet (R26.81);Muscle weakness (generalized) (M62.81)                Time: 8658-8596 OT Time Calculation (min): 22 min Charges:  OT General Charges $OT Visit: 1 Visit OT Evaluation $OT Eval Low Complexity: 1 Low  Leita Howell, OTR/L,CBIS  Supplemental OT - [facility] and [facility] Secure Chat Preferred    Ferrah Panagopoulos, Leita BIRCH 05/27/2024, 2:34 PM

## 2024-05-27 NOTE — Care Management Obs Status (Signed)
 MEDICARE OBSERVATION STATUS NOTIFICATION   Patient Details  Name: ZETA BUCY MRN: [id number] Date of Birth: 12-Jan-1971   Medicare Observation Status Notification Given:  Yes    Bridget Cordella Simmonds, LCSW 05/27/2024, 1:54 PM

## 2024-05-28 DIAGNOSIS — S32000D Wedge compression fracture of unspecified lumbar vertebra, subsequent encounter for fracture with routine healing: Secondary | ICD-10-CM | POA: Diagnosis not present

## 2024-05-28 DIAGNOSIS — R296 Repeated falls: Secondary | ICD-10-CM | POA: Diagnosis not present

## 2024-05-28 DIAGNOSIS — F312 Bipolar disorder, current episode manic severe with psychotic features: Secondary | ICD-10-CM | POA: Diagnosis not present

## 2024-05-28 LAB — VALPROIC ACID LEVEL: Valproic Acid Lvl: 98 ug/mL (ref 50–100)

## 2024-05-28 LAB — COMPREHENSIVE METABOLIC PANEL WITH GFR
ALT: 16 U/L (ref 0–44)
AST: 22 U/L (ref 15–41)
Albumin: 3.5 g/dL (ref 3.5–5.0)
Alkaline Phosphatase: 35 U/L — ABNORMAL LOW (ref 38–126)
Anion gap: 11 (ref 5–15)
BUN: 11 mg/dL (ref 6–20)
CO2: 22 mmol/L (ref 22–32)
Calcium: 9.2 mg/dL (ref 8.9–10.3)
Chloride: 107 mmol/L (ref 98–111)
Creatinine, Ser: 0.59 mg/dL (ref 0.44–1.00)
GFR, Estimated: >60 mL/min (ref >60)
Glucose, Bld: 114 mg/dL — ABNORMAL HIGH (ref 70–99)
Potassium: 3.7 mmol/L (ref 3.5–5.1)
Sodium: 140 mmol/L (ref 135–145)
Total Bilirubin: 1 mg/dL (ref 0.0–1.2)
Total Protein: 6.5 g/dL (ref 6.5–8.1)

## 2024-05-28 LAB — CBC WITH DIFFERENTIAL/PLATELET
Abs Immature Granulocytes: 0.01 K/uL (ref 0.00–0.07)
Basophils Absolute: 0 K/uL (ref 0.0–0.1)
Basophils Relative: 1 %
Eosinophils Absolute: 0.2 K/uL (ref 0.0–0.5)
Eosinophils Relative: 4 %
HCT: 42.5 % (ref 36.0–46.0)
Hemoglobin: 14.5 g/dL (ref 12.0–15.0)
Immature Granulocytes: 0 %
Lymphocytes Relative: 48 %
Lymphs Abs: 2.5 K/uL (ref 0.7–4.0)
MCH: 30 pg (ref 26.0–34.0)
MCHC: 34.1 g/dL (ref 30.0–36.0)
MCV: 87.8 fL (ref 80.0–100.0)
Monocytes Absolute: 0.5 K/uL (ref 0.1–1.0)
Monocytes Relative: 9 %
Neutro Abs: 2 K/uL (ref 1.7–7.7)
Neutrophils Relative %: 38 %
Platelets: 213 K/uL (ref 150–400)
RBC: 4.84 MIL/uL (ref 3.87–5.11)
RDW: 15.6 % — ABNORMAL HIGH (ref 11.5–15.5)
WBC: 5.2 K/uL (ref 4.0–10.5)
nRBC: 0 % (ref 0.0–0.2)

## 2024-05-28 LAB — MAGNESIUM: Magnesium: 1.7 mg/dL (ref 1.7–2.4)

## 2024-05-28 NOTE — Discharge Summary (Signed)
 Physician Discharge Summary   Allison Bolton FMW:[id number] DOB: 1971/07/07 DOA: 05/25/2024  PCP: [facility]  Admit date: 05/25/2024 Discharge date: 05/28/2024  Admitted From: Group Home Disposition:  same Discharging physician: Alm Apo, MD Barriers to discharge: none  Recommendations at discharge: Follow up with psychiatry as planned Consider reducing daytime dosages if remains oversedated or still having difficulty with ambulating/falls   Discharge Condition: stable CODE STATUS: Full  Diet recommendation:  Diet Orders (From admission, onward)     Start     Ordered   05/28/24 0000  Diet general        05/28/24 1009   05/26/24 1750  Diet regular Fluid consistency: Thin  Diet effective now       Question:  Fluid consistency:  Answer:  Thin   05/26/24 1749            Hospital Course: Allison Bolton is a 53 yo female with PMH bipolar disorder, manic type, major neurocognitive disorder, asthma, fibromyalgia, DMII, ovarian/cervical CA, Meniere's.  Recent prolonged hospitalization with behavioral health from 8/14 until 11/2.  Treated for acute psychosis with mania and psychotic symptoms.  Presented to [facility] 11/20 with weakness accompanied by caretaker.  Told to be at her neurobaseline which is usually responding to herself and talking to herself unable to hold meaningful conversation.  Able to walk around at baseline.  Over the past couple days was having multiple falls with legs giving out.  Also having more difficulty feeding herself. Psychiatry notes reviewed, last note from 11/1 notes patient pleasant but minimal to engage.  Was having witnessed falls as well.  Due to falls prior to hospitalization, she underwent multiple imaging workup. She was admitted for ongoing observation.  Assessment and Plan: * Falls - Already happening during time recently at behavioral health.  Unclear if due to oversedation especially as she is  significantly sedated and somnolent in bed when seen - UA negative for signs of infection; appears overall nontoxic just over sedated -CT head unremarkable -CT cervical spine negative for acute abnormality -CT T/L spine negative for acute findings.  Chronic appearing L2 compression fracture with 50% height loss and 4 mm retropulsion into central canal.  Chronic appearing mild fractures at T2 and T6 -No significant spinal tenderness on exam - Did well on PT evaluation on 05/27/2024, ambulating 510 feet with no knee buckling or leg weakness; no PT needs at discharge - organic causes ruled out; no major metabolic abnormalities to explain either.  B12 level normal.  Folate level normal.  Ammonia level very mildly elevated but expected in setting of Depakote  use.  Vitamin B1 level pending at discharge but mentation improved regardless. - will consider MRI brain but for now still suspecting over-sedation (nothing focal) - for now okay for discharge; have discussed with group home that if falls do continue to persist or her lethargy continues to hinder ADLs then medication dosage reduction might need to be further considered still by psychiatry, possibly at least just daytime medications  Bipolar I disorder, current or most recent episode manic, with psychotic features (HCC) - Home meds resumed - Psychiatry consulted given concern for oversedation; recommended for her to remain on her regimen given benefits outweigh risks to decrease for now - On Cogentin , Depakote , Haldol , Geodon   Vertebral compression fracture (HCC) - CT T/L spine negative for acute findings.  Chronic appearing L2 compression fracture with 50% height loss and 4 mm retropulsion into central canal.  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 On day of discharge, patient was felt deemed stable for discharge. Patient/family member advised to call PCP or come back to ER if needed.   Principal Diagnosis: Falls  Discharge Diagnoses: Active Hospital Problems   Diagnosis Date Noted   Falls 05/25/2024    Priority: 1.   Bipolar I disorder, current or most recent episode manic, with psychotic features (HCC)     Priority: 2.   Vertebral compression fracture (HCC) 05/27/2024    Priority: 3.   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     Discharge Instructions     Diet general   Complete by: As directed    Increase activity slowly   Complete by: As directed       Allergies as of 05/28/2024       Reactions   Amoxicillin Dermatitis, Rash, Other (See Comments)   Patient states she feels sunburnt when taking amoxicillin   Tramadol Dermatitis, Rash   Amoxicillin-pot Clavulanate Dermatitis   Sunburn-like painful rash        Medication List     TAKE these medications    albuterol  108 (90 Base) MCG/ACT inhaler Commonly known as: VENTOLIN  HFA Inhale 2 puffs into the lungs every 4 (four) hours as needed for wheezing or shortness of breath.   benztropine  1 MG tablet Commonly known as: COGENTIN  Take 1 tablet (1 mg total) by mouth 2 (two) times daily.   divalproex  250 MG DR tablet Commonly known as: DEPAKOTE  Take 3 tablets (750 mg total) by mouth every 12 (twelve) hours.   haloperidol  10 MG tablet Commonly known as: HALDOL  Take 1 tablet (10 mg total) by mouth every 12 (twelve) hours.   traZODone  50 MG tablet Commonly known as: DESYREL  Take 1 tablet (50 mg total) by mouth at bedtime as needed for sleep.   ziprasidone  60 MG capsule Commonly known as: GEODON  Take 1 capsule (60 mg total) by mouth 2 (two) times daily with a  meal.        Allergies  Allergen Reactions   Amoxicillin Dermatitis, Rash and Other (See Comments)    Patient states she feels sunburnt when taking amoxicillin   Tramadol Dermatitis and Rash   Amoxicillin-Pot Clavulanate Dermatitis    Sunburn-like painful rash    Consultations: Psychiatry   Procedures:   Discharge Exam: BP (!) 123/91 (BP Location: Left Arm)   Pulse 76   Temp 98.2 F (36.8 C) (Oral)   Resp 16   Wt 83.9 kg   SpO2 98%   BMI 29.86 kg/m  Physical Exam Constitutional:      Comments: More awake and alert talking today  HENT:     Head: Normocephalic and atraumatic.     Mouth/Throat:     Mouth: Mucous membranes are moist.  Eyes:     Extraocular Movements: Extraocular movements intact.  Cardiovascular:     Rate and Rhythm: Normal rate and regular rhythm.  Pulmonary:     Effort: Pulmonary effort is normal. No respiratory distress.     Breath sounds: Normal breath sounds. No wheezing.  Abdominal:     General: Bowel sounds are normal.  There is no distension.     Palpations: Abdomen is soft.     Tenderness: There is no abdominal tenderness.  Musculoskeletal:        General: No tenderness (No spinous process tenderness).     Cervical back: Normal range of motion and neck supple.  Skin:    General: Skin is warm and dry.  Neurological:     Comments: Follows all commands and moving all 4 extremities easily      The results of significant diagnostics from this hospitalization (including imaging, microbiology, ancillary and laboratory) are listed below for reference.   Microbiology: No results found for this or any previous visit (from the past 240 hours).   Labs: BNP (last 3 results) No results for input(s): BNP in the last 8760 hours. Basic Metabolic Panel: Recent Labs  Lab 05/25/24 1642 05/28/24 0900  NA 144 140  K 3.6 3.7  CL 108 107  CO2 26 22  GLUCOSE 103* 114*  BUN 15 11  CREATININE 0.64 0.59  CALCIUM 9.6 9.2  MG  --  1.7   Liver  Function Tests: Recent Labs  Lab 05/25/24 1642 05/28/24 0900  AST 15 22  ALT 9 16  ALKPHOS 42 35*  BILITOT 0.5 1.0  PROT 6.4* 6.5  ALBUMIN 4.0 3.5   Recent Labs  Lab 05/25/24 1642  LIPASE 12   Recent Labs  Lab 05/27/24 1431  AMMONIA 46*   CBC: Recent Labs  Lab 05/25/24 1642 05/28/24 0900  WBC 7.4 5.2  NEUTROABS 3.4 2.0  HGB 13.1 14.5  HCT 38.5 42.5  MCV 88.3 87.8  PLT 198 213   Cardiac Enzymes: No results for input(s): CKTOTAL, CKMB, CKMBINDEX, TROPONINI in the last 168 hours. BNP: Invalid input(s): POCBNP CBG: No results for input(s): GLUCAP in the last 168 hours. D-Dimer No results for input(s): DDIMER in the last 72 hours. Hgb A1c No results for input(s): HGBA1C in the last 72 hours. Lipid Profile No results for input(s): CHOL, HDL, LDLCALC, TRIG, CHOLHDL, LDLDIRECT in the last 72 hours. Thyroid  function studies No results for input(s): TSH, T4TOTAL, T3FREE, THYROIDAB in the last 72 hours.  Invalid input(s): FREET3 Anemia work up Recent Labs    05/27/24 1431  VITAMINB12 735  FOLATE >20.0   Urinalysis    Component Value Date/Time   COLORURINE YELLOW 05/25/2024 1746   APPEARANCEUR CLEAR 05/25/2024 1746   LABSPEC 1.027 05/25/2024 1746   PHURINE 6.0 05/25/2024 1746   GLUCOSEU NEGATIVE 05/25/2024 1746   HGBUR NEGATIVE 05/25/2024 1746   BILIRUBINUR NEGATIVE 05/25/2024 1746   KETONESUR 15 (A) 05/25/2024 1746   PROTEINUR TRACE (A) 05/25/2024 1746   UROBILINOGEN 0.2 08/09/2012 1843   NITRITE NEGATIVE 05/25/2024 1746   LEUKOCYTESUR NEGATIVE 05/25/2024 1746   Sepsis Labs Recent Labs  Lab 05/25/24 1642 05/28/24 0900  WBC 7.4 5.2   Microbiology No results found for this or any previous visit (from the past 240 hours).  Procedures/Studies: CT Thoracic Spine Wo Contrast Result Date: 05/25/2024 EXAM: CT THORACIC AND LUMBAR SPINE WITHOUT INTRAVENOUS CONTRAST 05/25/2024 09:16:27 PM TECHNIQUE: CT of the  thoracic and lumbar spine was performed without the administration of intravenous contrast. Multiplanar reformatted images are provided for review. Automated exposure control, iterative reconstruction, and/or weight based adjustment of the mA/kV was utilized to reduce the radiation dose to as low as reasonably achievable. Incidental adrenal and/or renal findings do not require follow up imaging. COMPARISON: X-ray lumbar spine 05/04/2019. CLINICAL HISTORY: Blunt trauma. FINDINGS: THORACIC SPINE: BONES AND  ALIGNMENT: Normal vertebral body heights. Chronic-appearing supraendplate T2 and T6 mild compression fractures. Diffusely decreased bone density. Normal alignment. DEGENERATIVE CHANGES: No significant degenerative changes. SOFT TISSUES: Atherosclerotic plaque. LUMBAR SPINE: BONES AND ALIGNMENT: Normal vertebral body heights. Interval chronic-appearing fracture of the L2 level with at least 50% vertebral body height loss and 4 mm retropulsion into the central canal. Diffusely decreased bone density. Normal alignment. DEGENERATIVE CHANGES: No significant degenerative changes. SOFT TISSUES: Bilateral nephrolithiasis. LUNGS: Bibasilar linear atelectasis versus scarring. IMPRESSION: 1. No acute findings. 2. Interval chronic-appearing L2 vertebral body compression fracture with at least 50% height loss and 4 mm retropulsion into the central canal. 3. Chronic-appearing mild compression fractures at T2 and T6. Electronically signed by: Morgane Naveau MD 05/25/2024 09:45 PM EST RP Workstation: [id number]   CT Lumbar Spine Wo Contrast Result Date: 05/25/2024 EXAM: CT THORACIC AND LUMBAR SPINE WITHOUT INTRAVENOUS CONTRAST 05/25/2024 09:16:27 PM TECHNIQUE: CT of the thoracic and lumbar spine was performed without the administration of intravenous contrast. Multiplanar reformatted images are provided for review. Automated exposure control, iterative reconstruction, and/or weight based adjustment of the mA/kV was utilized to  reduce the radiation dose to as low as reasonably achievable. Incidental adrenal and/or renal findings do not require follow up imaging. COMPARISON: X-ray lumbar spine 05/04/2019. CLINICAL HISTORY: Blunt trauma. FINDINGS: THORACIC SPINE: BONES AND ALIGNMENT: Normal vertebral body heights. Chronic-appearing supraendplate T2 and T6 mild compression fractures. Diffusely decreased bone density. Normal alignment. DEGENERATIVE CHANGES: No significant degenerative changes. SOFT TISSUES: Atherosclerotic plaque. LUMBAR SPINE: BONES AND ALIGNMENT: Normal vertebral body heights. Interval chronic-appearing fracture of the L2 level with at least 50% vertebral body height loss and 4 mm retropulsion into the central canal. Diffusely decreased bone density. Normal alignment. DEGENERATIVE CHANGES: No significant degenerative changes. SOFT TISSUES: Bilateral nephrolithiasis. LUNGS: Bibasilar linear atelectasis versus scarring. IMPRESSION: 1. No acute findings. 2. Interval chronic-appearing L2 vertebral body compression fracture with at least 50% height loss and 4 mm retropulsion into the central canal. 3. Chronic-appearing mild compression fractures at T2 and T6. Electronically signed by: Morgane Naveau MD 05/25/2024 09:45 PM EST RP Workstation: [id number]   CT Cervical Spine Wo Contrast Result Date: 05/25/2024 EXAM: CT CERVICAL SPINE WITHOUT CONTRAST 05/25/2024 05:43:46 PM TECHNIQUE: CT of the cervical spine was performed without the administration of intravenous contrast. Multiplanar reformatted images are provided for review. Automated exposure control, iterative reconstruction, and/or weight based adjustment of the mA/kV was utilized to reduce the radiation dose to as low as reasonably achievable. COMPARISON: CT cervical spine 05/03/2019. CLINICAL HISTORY: Polytrauma, blunt. FINDINGS: LIMITATIONS/ARTIFACTS: Examination is slightly limited by motion. CERVICAL SPINE: BONES AND ALIGNMENT: Straightening and slight reversal of the  normal cervical lordosis. No evidence of traumatic malalignment. Cervical vertebral body heights are maintained. Irregularity of the T2 superior endplate with up to 20% height loss centrally. This finding was not present on the studies from 2020 and is age indeterminate. Recommend correlation with tenderness at this level and consider MRI of the thoracic spine for further evaluation if clinically indicated. DEGENERATIVE CHANGES: No significant degenerative changes. SOFT TISSUES: No prevertebral soft tissue swelling. IMPRESSION: 1. No acute abnormality of the cervical spine. 2. Irregularity and up to 20% central height loss of the T2 superior endplate, age indeterminate. Recommend correlation with tenderness as this level and consider thoracic spine MRI for further evaluation if clinically warranted. Electronically signed by: Donnice Mania MD 05/25/2024 05:58 PM EST RP Workstation: [id number]   CT Head Wo Contrast Result Date: 05/25/2024 EXAM: CT HEAD WITHOUT CONTRAST 05/25/2024 05:43:46  PM TECHNIQUE: CT of the head was performed without the administration of intravenous contrast. Automated exposure control, iterative reconstruction, and/or weight based adjustment of the mA/kV was utilized to reduce the radiation dose to as low as reasonably achievable. COMPARISON: CT head 05/03/2019. CLINICAL HISTORY: multiple falls. eval for subdural/epidura FINDINGS: BRAIN AND VENTRICLES: No acute hemorrhage. No evidence of acute infarct. No hydrocephalus. No extra-axial collection. No mass effect or midline shift. ORBITS: No acute abnormality. SINUSES: No acute abnormality. SOFT TISSUES AND SKULL: No acute soft tissue abnormality. No skull fracture. IMPRESSION: 1. No acute intracranial abnormality. Electronically signed by: Donnice Mania MD 05/25/2024 05:52 PM EST RP Workstation: [id number]   DG Knee Complete 4 Views Right Result Date: 05/06/2024 EXAM: 4 VIEW(S) XRAY OF THE RIGHT KNEE 05/05/2024 11:55:34 PM COMPARISON: None  available. CLINICAL HISTORY: Fall. FINDINGS: BONES AND JOINTS: No acute fracture. No focal osseous lesion. No joint dislocation. No significant joint effusion. Mild tricompartmental osteophyte formation. Joint spaces are maintained. SOFT TISSUES: The soft tissues are unremarkable. IMPRESSION: 1. No acute findings. 2. Mild tricompartmental degenerative change. Electronically signed by: Greig Pique MD 05/06/2024 12:00 AM EDT RP Workstation: [id number]     Time coordinating discharge: Over 30 minutes    Alm Apo, MD  [facility] 05/28/2024, 2:19 PM

## 2024-05-28 NOTE — Progress Notes (Signed)
 Mobility Specialist Progress Note:    05/28/24 1056  Mobility  Activity Ambulated independently  Level of Assistance Standby assist, set-up cues, supervision of patient - no hands on  Assistive Device None  Distance Ambulated (ft) 100 ft  Range of Motion/Exercises Active  Activity Response Tolerated well  Mobility Referral Yes  Mobility visit 1 Mobility  Mobility Specialist Start Time (ACUTE ONLY) 1056  Mobility Specialist Stop Time (ACUTE ONLY) 1105  Mobility Specialist Time Calculation (min) (ACUTE ONLY) 9 min   Received pt in room agreeable to session before d/c. No c/o any symptoms. Pt seemed somewhat confused but otherwise moving and ambulating well. Pt needing no assist. Returned pt to bed w/ all needs met.  Venetia Keel Mobility Specialist Please Neurosurgeon or Rehab Office at [phone]

## 2024-05-28 NOTE — Plan of Care (Signed)
   Problem: Education: Goal: Knowledge of General Education information will improve Description: Including pain rating scale, medication(s)/side effects and non-pharmacologic comfort measures Outcome: Progressing   Problem: Nutrition: Goal: Adequate nutrition will be maintained Outcome: Progressing   Problem: Safety: Goal: Ability to remain free from injury will improve Outcome: Progressing

## 2024-05-28 NOTE — TOC Transition Note (Addendum)
 Transition of Care [facility]) - Discharge Note   Patient Details  Name: Allison Bolton MRN: [id number] Date of Birth: 01/23/1971  [facility]) CM/SW Contact:  Laurita Peron A Lucette Kratz, LCSW Phone Number: 05/28/2024, 3:11 PM   Clinical Narrative:     Pt plan to return to Group Home at DC.   CSW notified Legal Guardian of discharge. Per previous CSW note, Legal Guardian made aware of pending DC this weekend and provided permission to reach out to [facility] owner and coordinate DC. CSW left VM with contact information to reach out to CSW. CSW was then contacted by [place] at [facility] and stated she would relay message to supervisor and pt's legal guardian of DC.   Per MD, pt's group home owner Allison Bolton at [facility], will be picking up pt this evening. Provided contact # for pt's floor nurse for her to reach out to if needed.   No other CSW needs identified and CSW will sign off.   Final next level of care: Group Home Barriers to Discharge: Barriers Resolved   Patient Goals and CMS Choice Patient states their goals for this hospitalization and ongoing recovery are:: return to group home CMS Medicare.gov Compare Post Acute Care list provided to:: Legal Guardian Choice offered to / list presented to : NA      Discharge Placement              Patient chooses bed at:  (Pt returning to group home) Patient to be transferred to facility by: Allison Bolton, group home owner Name of family member notified: Pt's legal guardian Allison Bolton Patient and family notified of of transfer: 05/28/24  Discharge Plan and Services Additional resources added to the After Visit Summary for   In-house Referral: Clinical Social Work                                   Social Drivers of Health (SDOH) Interventions SDOH Screenings   Food Insecurity: Patient Unable To Answer (05/27/2024)  Housing: Unknown (05/27/2024)  Transportation Needs: Patient Unable To Answer (05/27/2024)  Utilities:  Patient Unable To Answer (05/27/2024)  Alcohol Screen: Low Risk  (02/18/2024)  Social Connections: Unknown (10/14/2021)   Received from [facility]  Tobacco Use: High Risk (05/25/2024)  Health Literacy: Unknown (10/14/2021)   Received from [facility]     Readmission Risk Interventions     No data to display

## 2024-05-28 NOTE — Plan of Care (Signed)

## 2024-05-29 DIAGNOSIS — F312 Bipolar disorder, current episode manic severe with psychotic features: Secondary | ICD-10-CM | POA: Diagnosis not present

## 2024-05-29 DIAGNOSIS — R296 Repeated falls: Secondary | ICD-10-CM | POA: Diagnosis not present

## 2024-05-29 LAB — COMPREHENSIVE METABOLIC PANEL WITH GFR
ALT: 14 U/L (ref 0–44)
AST: 16 U/L (ref 15–41)
Albumin: 3.1 g/dL — ABNORMAL LOW (ref 3.5–5.0)
Alkaline Phosphatase: 31 U/L — ABNORMAL LOW (ref 38–126)
Anion gap: 14 (ref 5–15)
BUN: 11 mg/dL (ref 6–20)
CO2: 25 mmol/L (ref 22–32)
Calcium: 9.2 mg/dL (ref 8.9–10.3)
Chloride: 106 mmol/L (ref 98–111)
Creatinine, Ser: 0.75 mg/dL (ref 0.44–1.00)
GFR, Estimated: >60 mL/min (ref >60)
Glucose, Bld: 104 mg/dL — ABNORMAL HIGH (ref 70–99)
Potassium: 3.6 mmol/L (ref 3.5–5.1)
Sodium: 145 mmol/L (ref 135–145)
Total Bilirubin: 0.5 mg/dL (ref 0.0–1.2)
Total Protein: 5.8 g/dL — ABNORMAL LOW (ref 6.5–8.1)

## 2024-05-29 LAB — CBC WITH DIFFERENTIAL/PLATELET
Abs Immature Granulocytes: 0.02 K/uL (ref 0.00–0.07)
Basophils Absolute: 0 K/uL (ref 0.0–0.1)
Basophils Relative: 1 %
Eosinophils Absolute: 0.3 K/uL (ref 0.0–0.5)
Eosinophils Relative: 4 %
HCT: 36 % (ref 36.0–46.0)
Hemoglobin: 12.7 g/dL (ref 12.0–15.0)
Immature Granulocytes: 0 %
Lymphocytes Relative: 52 %
Lymphs Abs: 3.3 K/uL (ref 0.7–4.0)
MCH: 30.5 pg (ref 26.0–34.0)
MCHC: 35.3 g/dL (ref 30.0–36.0)
MCV: 86.3 fL (ref 80.0–100.0)
Monocytes Absolute: 0.7 K/uL (ref 0.1–1.0)
Monocytes Relative: 11 %
Neutro Abs: 2 K/uL (ref 1.7–7.7)
Neutrophils Relative %: 32 %
Platelets: 206 K/uL (ref 150–400)
RBC: 4.17 MIL/uL (ref 3.87–5.11)
RDW: 15.5 % (ref 11.5–15.5)
WBC: 6.4 K/uL (ref 4.0–10.5)
nRBC: 0 % (ref 0.0–0.2)

## 2024-05-29 LAB — VITAMIN B1: Vitamin B1 (Thiamine): 168.4 nmol/L (ref 66.5–200.0)

## 2024-05-29 LAB — MAGNESIUM: Magnesium: 1.8 mg/dL (ref 1.7–2.4)

## 2024-05-29 MED ORDER — MELATONIN 5 MG PO TABS: 5.0000 mg | ORAL_TABLET | Freq: Every day | ORAL | Status: DC

## 2024-05-29 MED ORDER — MELATONIN 5 MG PO TABS: 5.0000 mg | ORAL_TABLET | Freq: Every day | ORAL | Status: AC

## 2024-05-29 NOTE — Plan of Care (Signed)

## 2024-05-29 NOTE — TOC Transition Note (Signed)
 Transition of Care [facility]) - Discharge Note   Patient Details  Name: Allison Bolton MRN: [id number] Date of Birth: Jul 26, 1970  [facility] CM/SW Contact:  Cena Ligas, LCSW Phone Number: 05/29/2024, 9:31 AM   Clinical Narrative:     LCSW spoke with group home manager Amber and confirmed that she will be picking patient up in about an hour. LCSW informed nurse.   There are no other social work needs identified. ICM will sign out.  Final next level of care: Group Home Barriers to Discharge: Barriers Resolved   Patient Goals and CMS Choice Patient states their goals for this hospitalization and ongoing recovery are:: return to group home CMS Medicare.gov Compare Post Acute Care list provided to:: Legal Guardian Choice offered to / list presented to : NA      Discharge Placement              Patient chooses bed at:  (Pt returning to group home) Patient to be transferred to facility by: Amber, group home owner Name of family member notified: Pt's legal guardian Jama Candy Patient and family notified of of transfer: 05/28/24  Discharge Plan and Services Additional resources added to the After Visit Summary for   In-house Referral: Clinical Social Work                                   Social Drivers of Health (SDOH) Interventions SDOH Screenings   Food Insecurity: Patient Unable To Answer (05/27/2024)  Housing: Unknown (05/27/2024)  Transportation Needs: Patient Unable To Answer (05/27/2024)  Utilities: Patient Unable To Answer (05/27/2024)  Alcohol Screen: Low Risk  (02/18/2024)  Social Connections: Unknown (10/14/2021)   Received from [facility]  Tobacco Use: High Risk (05/25/2024)  Health Literacy: Unknown (10/14/2021)   Received from [facility]     Readmission Risk Interventions     No data to display

## 2024-05-29 NOTE — Discharge Summary (Signed)
 Physician Discharge Summary   Allison Bolton FMW:[id number] DOB: 06-06-1971 DOA: 05/25/2024  PCP: [facility]  Admit date: 05/25/2024 Discharge date: 05/29/2024  Admitted From: Group Home Disposition:  same Discharging physician: Alm Apo, MD Barriers to discharge: none  Recommendations at discharge: Follow up with psychiatry as planned Consider reducing daytime dosages if remains oversedated or still having difficulty with ambulating/falls   Discharge Condition: stable CODE STATUS: Full  Diet recommendation:  Diet Orders (From admission, onward)     Start     Ordered   05/28/24 0000  Diet general        05/28/24 1009   05/26/24 1750  Diet regular Fluid consistency: Thin  Diet effective now       Question:  Fluid consistency:  Answer:  Thin   05/26/24 1749            Hospital Course: [name] is a 53 yo female with PMH bipolar disorder, manic type, major neurocognitive disorder, asthma, fibromyalgia, DMII, ovarian/cervical CA, Meniere's.  Recent prolonged hospitalization with behavioral health from 8/14 until 11/2.  Treated for acute psychosis with mania and psychotic symptoms.  Presented to [facility] on 11/20 with weakness accompanied by caretaker.  Told to be at her neurobaseline which is usually responding to herself and talking to herself unable to hold meaningful conversation.  Able to walk around at baseline.  Over the past couple days was having multiple falls with legs giving out.  Also having more difficulty feeding herself. Psychiatry notes reviewed, last note from 11/1 notes patient pleasant but minimal to engage.  Was having witnessed falls as well.  Due to falls prior to hospitalization, she underwent multiple imaging workup. She was admitted for ongoing observation.  Assessment and Plan: * Falls - Already happening during time recently at behavioral health.  Unclear if due to oversedation especially as she is  significantly sedated and somnolent in bed when seen - UA negative for signs of infection; appears overall nontoxic just over sedated -CT head unremarkable -CT cervical spine negative for acute abnormality -CT T/L spine negative for acute findings.  Chronic appearing L2 compression fracture with 50% height loss and 4 mm retropulsion into central canal.  Chronic appearing mild fractures at T2 and T6 -No significant spinal tenderness on exam - Did well on PT evaluation on 05/27/2024, ambulating 510 feet with no knee buckling or leg weakness; no PT needs at discharge - organic causes ruled out; no major metabolic abnormalities to explain either.  B12 level normal.  Folate level normal.  Ammonia level very mildly elevated but expected in setting of Depakote  use.  Vitamin B1 level pending at discharge but mentation improved regardless. - will consider MRI brain but for now still suspecting over-sedation (nothing focal) - for now okay for discharge; have discussed with group home that if falls do continue to persist or her lethargy continues to hinder ADLs then medication dosage reduction might need to be further considered still by psychiatry, possibly at least just daytime medications  Bipolar I disorder, current or most recent episode manic, with psychotic features (HCC) - Home meds resumed - Psychiatry consulted given concern for oversedation; recommended for her to remain on her regimen given benefits outweigh risks to decrease for now - On Cogentin , Depakote , Haldol , Geodon   Vertebral compression fracture (HCC) - CT T/L spine negative for acute findings.  Chronic appearing L2 compression fracture with 50% height loss and 4 mm retropulsion into central canal.  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 On day of discharge, patient was felt deemed stable for discharge. Patient/family member advised to call PCP or come back to ER if needed.   Principal Diagnosis: Falls  Discharge Diagnoses: Active Hospital Problems   Diagnosis Date Noted   Falls 05/25/2024    Priority: 1.   Bipolar I disorder, current or most recent episode manic, with psychotic features (HCC)     Priority: 2.   Vertebral compression fracture (HCC) 05/27/2024    Priority: 3.   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     Discharge Instructions     Diet general   Complete by: As directed    Increase activity slowly   Complete by: As directed       Allergies as of 05/29/2024       Reactions   Amoxicillin Dermatitis, Rash, Other (See Comments)   Patient states she feels sunburnt when taking amoxicillin   Tramadol Dermatitis, Rash   Amoxicillin-pot Clavulanate Dermatitis   Sunburn-like painful rash        Medication List     TAKE these medications    albuterol  108 (90 Base) MCG/ACT inhaler Commonly known as: VENTOLIN  HFA Inhale 2 puffs into the lungs every 4 (four) hours as needed for wheezing or shortness of breath.   benztropine  1 MG tablet Commonly known as: COGENTIN  Take 1 tablet (1 mg total) by mouth 2 (two) times daily.   divalproex  250 MG DR tablet Commonly known as: DEPAKOTE  Take 3 tablets (750 mg total) by mouth every 12 (twelve) hours.   haloperidol  10 MG tablet Commonly known as: HALDOL  Take 1 tablet (10 mg total) by mouth every 12 (twelve) hours.   melatonin 5 MG Tabs Take 1 tablet (5 mg total) by mouth at bedtime.   traZODone  50 MG tablet Commonly known as: DESYREL  Take 1 tablet (50 mg total) by mouth at bedtime as needed for sleep.   ziprasidone  60 MG capsule Commonly known as:  GEODON  Take 1 capsule (60 mg total) by mouth 2 (two) times daily with a meal.        Allergies  Allergen Reactions   Amoxicillin Dermatitis, Rash and Other (See Comments)    Patient states she feels sunburnt when taking amoxicillin   Tramadol Dermatitis and Rash   Amoxicillin-Pot Clavulanate Dermatitis    Sunburn-like painful rash    Consultations: Psychiatry   Procedures:   Discharge Exam: BP 112/74 (BP Location: Left Arm)   Pulse 62   Temp 97.8 F (36.6 C)   Resp 16   Wt 83.9 kg   SpO2 99%   BMI 29.86 kg/m  Physical Exam Constitutional:      Comments: More awake and alert talking today  HENT:     Head: Normocephalic and atraumatic.     Mouth/Throat:     Mouth: Mucous membranes are moist.  Eyes:     Extraocular Movements: Extraocular movements intact.  Cardiovascular:     Rate and Rhythm: Normal rate and regular rhythm.  Pulmonary:     Effort: Pulmonary effort is normal. No respiratory distress.     Breath sounds: Normal breath  sounds. No wheezing.  Abdominal:     General: Bowel sounds are normal. There is no distension.     Palpations: Abdomen is soft.     Tenderness: There is no abdominal tenderness.  Musculoskeletal:        General: No tenderness (No spinous process tenderness).     Cervical back: Normal range of motion and neck supple.  Skin:    General: Skin is warm and dry.  Neurological:     Comments: Follows all commands and moving all 4 extremities easily      The results of significant diagnostics from this hospitalization (including imaging, microbiology, ancillary and laboratory) are listed below for reference.   Microbiology: No results found for this or any previous visit (from the past 240 hours).   Labs: BNP (last 3 results) No results for input(s): BNP in the last 8760 hours. Basic Metabolic Panel: Recent Labs  Lab 05/25/24 1642 05/28/24 0900 05/29/24 0601  NA 144 140 145  K 3.6 3.7 3.6  CL 108 107 106  CO2 26 22 25    GLUCOSE 103* 114* 104*  BUN 15 11 11   CREATININE 0.64 0.59 0.75  CALCIUM 9.6 9.2 9.2  MG  --  1.7 1.8   Liver Function Tests: Recent Labs  Lab 05/25/24 1642 05/28/24 0900 05/29/24 0601  AST 15 22 16   ALT 9 16 14   ALKPHOS 42 35* 31*  BILITOT 0.5 1.0 0.5  PROT 6.4* 6.5 5.8*  ALBUMIN 4.0 3.5 3.1*   Recent Labs  Lab 05/25/24 1642  LIPASE 12   Recent Labs  Lab 05/27/24 1431  AMMONIA 46*   CBC: Recent Labs  Lab 05/25/24 1642 05/28/24 0900 05/29/24 0601  WBC 7.4 5.2 6.4  NEUTROABS 3.4 2.0 2.0  HGB 13.1 14.5 12.7  HCT 38.5 42.5 36.0  MCV 88.3 87.8 86.3  PLT 198 213 206   Cardiac Enzymes: No results for input(s): CKTOTAL, CKMB, CKMBINDEX, TROPONINI in the last 168 hours. BNP: Invalid input(s): POCBNP CBG: No results for input(s): GLUCAP in the last 168 hours. D-Dimer No results for input(s): DDIMER in the last 72 hours. Hgb A1c No results for input(s): HGBA1C in the last 72 hours. Lipid Profile No results for input(s): CHOL, HDL, LDLCALC, TRIG, CHOLHDL, LDLDIRECT in the last 72 hours. Thyroid  function studies No results for input(s): TSH, T4TOTAL, T3FREE, THYROIDAB in the last 72 hours.  Invalid input(s): FREET3 Anemia work up Recent Labs    05/27/24 1431  VITAMINB12 735  FOLATE >20.0   Urinalysis    Component Value Date/Time   COLORURINE YELLOW 05/25/2024 1746   APPEARANCEUR CLEAR 05/25/2024 1746   LABSPEC 1.027 05/25/2024 1746   PHURINE 6.0 05/25/2024 1746   GLUCOSEU NEGATIVE 05/25/2024 1746   HGBUR NEGATIVE 05/25/2024 1746   BILIRUBINUR NEGATIVE 05/25/2024 1746   KETONESUR 15 (A) 05/25/2024 1746   PROTEINUR TRACE (A) 05/25/2024 1746   UROBILINOGEN 0.2 08/09/2012 1843   NITRITE NEGATIVE 05/25/2024 1746   LEUKOCYTESUR NEGATIVE 05/25/2024 1746   Sepsis Labs Recent Labs  Lab 05/25/24 1642 05/28/24 0900 05/29/24 0601  WBC 7.4 5.2 6.4   Microbiology No results found for this or any previous visit  (from the past 240 hours).  Procedures/Studies: CT Thoracic Spine Wo Contrast Result Date: 05/25/2024 EXAM: CT THORACIC AND LUMBAR SPINE WITHOUT INTRAVENOUS CONTRAST 05/25/2024 09:16:27 PM TECHNIQUE: CT of the thoracic and lumbar spine was performed without the administration of intravenous contrast. Multiplanar reformatted images are provided for review. Automated exposure control, iterative reconstruction, and/or  weight based adjustment of the mA/kV was utilized to reduce the radiation dose to as low as reasonably achievable. Incidental adrenal and/or renal findings do not require follow up imaging. COMPARISON: X-ray lumbar spine 05/04/2019. CLINICAL HISTORY: Blunt trauma. FINDINGS: THORACIC SPINE: BONES AND ALIGNMENT: Normal vertebral body heights. Chronic-appearing supraendplate T2 and T6 mild compression fractures. Diffusely decreased bone density. Normal alignment. DEGENERATIVE CHANGES: No significant degenerative changes. SOFT TISSUES: Atherosclerotic plaque. LUMBAR SPINE: BONES AND ALIGNMENT: Normal vertebral body heights. Interval chronic-appearing fracture of the L2 level with at least 50% vertebral body height loss and 4 mm retropulsion into the central canal. Diffusely decreased bone density. Normal alignment. DEGENERATIVE CHANGES: No significant degenerative changes. SOFT TISSUES: Bilateral nephrolithiasis. LUNGS: Bibasilar linear atelectasis versus scarring. IMPRESSION: 1. No acute findings. 2. Interval chronic-appearing L2 vertebral body compression fracture with at least 50% height loss and 4 mm retropulsion into the central canal. 3. Chronic-appearing mild compression fractures at T2 and T6. Electronically signed by: Morgane Naveau MD 05/25/2024 09:45 PM EST RP Workstation: [id number]   CT Lumbar Spine Wo Contrast Result Date: 05/25/2024 EXAM: CT THORACIC AND LUMBAR SPINE WITHOUT INTRAVENOUS CONTRAST 05/25/2024 09:16:27 PM TECHNIQUE: CT of the thoracic and lumbar spine was performed without  the administration of intravenous contrast. Multiplanar reformatted images are provided for review. Automated exposure control, iterative reconstruction, and/or weight based adjustment of the mA/kV was utilized to reduce the radiation dose to as low as reasonably achievable. Incidental adrenal and/or renal findings do not require follow up imaging. COMPARISON: X-ray lumbar spine 05/04/2019. CLINICAL HISTORY: Blunt trauma. FINDINGS: THORACIC SPINE: BONES AND ALIGNMENT: Normal vertebral body heights. Chronic-appearing supraendplate T2 and T6 mild compression fractures. Diffusely decreased bone density. Normal alignment. DEGENERATIVE CHANGES: No significant degenerative changes. SOFT TISSUES: Atherosclerotic plaque. LUMBAR SPINE: BONES AND ALIGNMENT: Normal vertebral body heights. Interval chronic-appearing fracture of the L2 level with at least 50% vertebral body height loss and 4 mm retropulsion into the central canal. Diffusely decreased bone density. Normal alignment. DEGENERATIVE CHANGES: No significant degenerative changes. SOFT TISSUES: Bilateral nephrolithiasis. LUNGS: Bibasilar linear atelectasis versus scarring. IMPRESSION: 1. No acute findings. 2. Interval chronic-appearing L2 vertebral body compression fracture with at least 50% height loss and 4 mm retropulsion into the central canal. 3. Chronic-appearing mild compression fractures at T2 and T6. Electronically signed by: Morgane Naveau MD 05/25/2024 09:45 PM EST RP Workstation: [id number]   CT Cervical Spine Wo Contrast Result Date: 05/25/2024 EXAM: CT CERVICAL SPINE WITHOUT CONTRAST 05/25/2024 05:43:46 PM TECHNIQUE: CT of the cervical spine was performed without the administration of intravenous contrast. Multiplanar reformatted images are provided for review. Automated exposure control, iterative reconstruction, and/or weight based adjustment of the mA/kV was utilized to reduce the radiation dose to as low as reasonably achievable. COMPARISON: CT  cervical spine 05/03/2019. CLINICAL HISTORY: Polytrauma, blunt. FINDINGS: LIMITATIONS/ARTIFACTS: Examination is slightly limited by motion. CERVICAL SPINE: BONES AND ALIGNMENT: Straightening and slight reversal of the normal cervical lordosis. No evidence of traumatic malalignment. Cervical vertebral body heights are maintained. Irregularity of the T2 superior endplate with up to 20% height loss centrally. This finding was not present on the studies from 2020 and is age indeterminate. Recommend correlation with tenderness at this level and consider MRI of the thoracic spine for further evaluation if clinically indicated. DEGENERATIVE CHANGES: No significant degenerative changes. SOFT TISSUES: No prevertebral soft tissue swelling. IMPRESSION: 1. No acute abnormality of the cervical spine. 2. Irregularity and up to 20% central height loss of the T2 superior endplate, age indeterminate. Recommend correlation with  tenderness as this level and consider thoracic spine MRI for further evaluation if clinically warranted. Electronically signed by: Donnice Mania MD 05/25/2024 05:58 PM EST RP Workstation: [id number]   CT Head Wo Contrast Result Date: 05/25/2024 EXAM: CT HEAD WITHOUT CONTRAST 05/25/2024 05:43:46 PM TECHNIQUE: CT of the head was performed without the administration of intravenous contrast. Automated exposure control, iterative reconstruction, and/or weight based adjustment of the mA/kV was utilized to reduce the radiation dose to as low as reasonably achievable. COMPARISON: CT head 05/03/2019. CLINICAL HISTORY: multiple falls. eval for subdural/epidura FINDINGS: BRAIN AND VENTRICLES: No acute hemorrhage. No evidence of acute infarct. No hydrocephalus. No extra-axial collection. No mass effect or midline shift. ORBITS: No acute abnormality. SINUSES: No acute abnormality. SOFT TISSUES AND SKULL: No acute soft tissue abnormality. No skull fracture. IMPRESSION: 1. No acute intracranial abnormality. Electronically  signed by: Donnice Mania MD 05/25/2024 05:52 PM EST RP Workstation: [id number]   DG Knee Complete 4 Views Right Result Date: 05/06/2024 EXAM: 4 VIEW(S) XRAY OF THE RIGHT KNEE 05/05/2024 11:55:34 PM COMPARISON: None available. CLINICAL HISTORY: Fall. FINDINGS: BONES AND JOINTS: No acute fracture. No focal osseous lesion. No joint dislocation. No significant joint effusion. Mild tricompartmental osteophyte formation. Joint spaces are maintained. SOFT TISSUES: The soft tissues are unremarkable. IMPRESSION: 1. No acute findings. 2. Mild tricompartmental degenerative change. Electronically signed by: Greig Pique MD 05/06/2024 12:00 AM EDT RP Workstation: [id number]     Time coordinating discharge: Over 30 minutes    Alm Apo, MD  [facility] 05/29/2024, 12:07 PM
